# Patient Record
Sex: Male | Born: 1940
Health system: Southern US, Community
[De-identification: ages and names within clinical notes are randomized; demographics above are authoritative.]

## PROBLEM LIST (undated history)

## (undated) DIAGNOSIS — D3A8 Other benign neuroendocrine tumors: Secondary | ICD-10-CM

## (undated) DIAGNOSIS — I10 Essential (primary) hypertension: Secondary | ICD-10-CM

## (undated) DIAGNOSIS — N400 Enlarged prostate without lower urinary tract symptoms: Secondary | ICD-10-CM

## (undated) DIAGNOSIS — K08109 Complete loss of teeth, unspecified cause, unspecified class: Secondary | ICD-10-CM

## (undated) DIAGNOSIS — R7301 Impaired fasting glucose: Secondary | ICD-10-CM

## (undated) DIAGNOSIS — Z973 Presence of spectacles and contact lenses: Secondary | ICD-10-CM

## (undated) DIAGNOSIS — U071 COVID-19: Secondary | ICD-10-CM

## (undated) DIAGNOSIS — R079 Chest pain, unspecified: Secondary | ICD-10-CM

## (undated) DIAGNOSIS — I4892 Unspecified atrial flutter: Secondary | ICD-10-CM

## (undated) DIAGNOSIS — J8410 Pulmonary fibrosis, unspecified: Secondary | ICD-10-CM

## (undated) DIAGNOSIS — I2510 Atherosclerotic heart disease of native coronary artery without angina pectoris: Secondary | ICD-10-CM

## (undated) DIAGNOSIS — Z8679 Personal history of other diseases of the circulatory system: Secondary | ICD-10-CM

## (undated) DIAGNOSIS — R7989 Other specified abnormal findings of blood chemistry: Secondary | ICD-10-CM

## (undated) DIAGNOSIS — Z8639 Personal history of other endocrine, nutritional and metabolic disease: Secondary | ICD-10-CM

## (undated) DIAGNOSIS — I700 Atherosclerosis of aorta: Secondary | ICD-10-CM

## (undated) DIAGNOSIS — E785 Hyperlipidemia, unspecified: Secondary | ICD-10-CM

## (undated) DIAGNOSIS — J189 Pneumonia, unspecified organism: Secondary | ICD-10-CM

## (undated) DIAGNOSIS — E872 Acidosis, unspecified: Secondary | ICD-10-CM

## (undated) DIAGNOSIS — Z972 Presence of dental prosthetic device (complete) (partial): Secondary | ICD-10-CM

## (undated) DIAGNOSIS — J449 Chronic obstructive pulmonary disease, unspecified: Secondary | ICD-10-CM

## (undated) DIAGNOSIS — D72821 Monocytosis (symptomatic): Secondary | ICD-10-CM

## (undated) DIAGNOSIS — R7303 Prediabetes: Secondary | ICD-10-CM

## (undated) DIAGNOSIS — J309 Allergic rhinitis, unspecified: Secondary | ICD-10-CM

## (undated) DIAGNOSIS — Z87891 Personal history of nicotine dependence: Secondary | ICD-10-CM

## (undated) DIAGNOSIS — K5792 Diverticulitis of intestine, part unspecified, without perforation or abscess without bleeding: Secondary | ICD-10-CM

## (undated) DIAGNOSIS — Z85110 Personal history of malignant carcinoid tumor of bronchus and lung: Secondary | ICD-10-CM

## (undated) DIAGNOSIS — E119 Type 2 diabetes mellitus without complications: Secondary | ICD-10-CM

## (undated) DIAGNOSIS — C3490 Malignant neoplasm of unspecified part of unspecified bronchus or lung: Secondary | ICD-10-CM

## (undated) DIAGNOSIS — K625 Hemorrhage of anus and rectum: Secondary | ICD-10-CM

## (undated) DIAGNOSIS — N401 Enlarged prostate with lower urinary tract symptoms: Secondary | ICD-10-CM

## (undated) DIAGNOSIS — I4891 Unspecified atrial fibrillation: Secondary | ICD-10-CM

## (undated) DIAGNOSIS — I499 Cardiac arrhythmia, unspecified: Secondary | ICD-10-CM

## (undated) DIAGNOSIS — M8580 Other specified disorders of bone density and structure, unspecified site: Secondary | ICD-10-CM

## (undated) DIAGNOSIS — R160 Hepatomegaly, not elsewhere classified: Secondary | ICD-10-CM

## (undated) DIAGNOSIS — C229 Malignant neoplasm of liver, not specified as primary or secondary: Secondary | ICD-10-CM

## (undated) DIAGNOSIS — R001 Bradycardia, unspecified: Secondary | ICD-10-CM

## (undated) DIAGNOSIS — J9611 Chronic respiratory failure with hypoxia: Secondary | ICD-10-CM

## (undated) HISTORY — DX: Hypomagnesemia: E83.42

## (undated) HISTORY — DX: Personal history of other endocrine, nutritional and metabolic disease: Z86.39

## (undated) HISTORY — DX: Unspecified atrial flutter: I48.92

## (undated) HISTORY — DX: Essential (primary) hypertension: I10

## (undated) HISTORY — DX: Hepatomegaly, not elsewhere classified: R16.0

## (undated) HISTORY — PX: MULTIPLE TOOTH EXTRACTIONS: SHX2053

## (undated) HISTORY — DX: Impaired fasting glucose: R73.01

## (undated) HISTORY — PX: VASECTOMY: SHX75

## (undated) HISTORY — DX: Benign prostatic hyperplasia without lower urinary tract symptoms: N40.0

## (undated) HISTORY — DX: Chest pain, unspecified: R07.9

## (undated) HISTORY — PX: TONSILLECTOMY: SUR1361

## (undated) HISTORY — DX: Malignant neoplasm of liver, not specified as primary or secondary: C22.9

## (undated) HISTORY — DX: Malignant neoplasm of unspecified part of unspecified bronchus or lung: C34.90

## (undated) HISTORY — PX: BRONCHOSCOPY: SUR163

## (undated) HISTORY — DX: Allergic rhinitis, unspecified: J30.9

## (undated) HISTORY — PX: COLONOSCOPY W/ POLYPECTOMY: SHX1380

## (undated) HISTORY — DX: Benign prostatic hyperplasia with lower urinary tract symptoms: N40.1

## (undated) HISTORY — DX: Monocytosis (symptomatic): D72.821

## (undated) HISTORY — DX: Personal history of malignant carcinoid tumor of bronchus and lung: Z85.110

## (undated) HISTORY — DX: Other specified disorders of bone density and structure, unspecified site: M85.80

---

## 2005-11-02 ENCOUNTER — Ambulatory Visit: Payer: Self-pay | Admitting: General Surgery

## 2011-12-09 ENCOUNTER — Encounter (HOSPITAL_BASED_OUTPATIENT_CLINIC_OR_DEPARTMENT_OTHER): Payer: Self-pay | Admitting: *Deleted

## 2011-12-09 NOTE — Progress Notes (Signed)
Pt has not had much surgery-since he bleed post polypectomy during colonoscopy-asks about bleeding. Did say that was unlikely, but to talk to dr Merlyn Lot. Will need istat and ekg preop-[place]-will call pcp for any records

## 2011-12-10 ENCOUNTER — Other Ambulatory Visit: Payer: Self-pay | Admitting: Orthopedic Surgery

## 2011-12-15 ENCOUNTER — Other Ambulatory Visit: Payer: Self-pay

## 2011-12-15 ENCOUNTER — Ambulatory Visit (HOSPITAL_BASED_OUTPATIENT_CLINIC_OR_DEPARTMENT_OTHER)
Admission: RE | Admit: 2011-12-15 | Discharge: 2011-12-15 | Disposition: A | Discharge status: Home / Self Care | Payer: Medicare Other | Source: Ambulatory Visit | Attending: Orthopedic Surgery | Admitting: Orthopedic Surgery

## 2011-12-15 ENCOUNTER — Encounter (HOSPITAL_BASED_OUTPATIENT_CLINIC_OR_DEPARTMENT_OTHER): Payer: Self-pay | Admitting: Anesthesiology

## 2011-12-15 ENCOUNTER — Ambulatory Visit (HOSPITAL_BASED_OUTPATIENT_CLINIC_OR_DEPARTMENT_OTHER): Payer: Medicare Other | Admitting: Anesthesiology

## 2011-12-15 ENCOUNTER — Encounter (HOSPITAL_BASED_OUTPATIENT_CLINIC_OR_DEPARTMENT_OTHER): Payer: Self-pay | Admitting: *Deleted

## 2011-12-15 ENCOUNTER — Encounter (HOSPITAL_BASED_OUTPATIENT_CLINIC_OR_DEPARTMENT_OTHER)
Admission: RE | Disposition: A | Discharge status: Home / Self Care | Payer: Self-pay | Source: Ambulatory Visit | Attending: Orthopedic Surgery

## 2011-12-15 ENCOUNTER — Encounter (HOSPITAL_BASED_OUTPATIENT_CLINIC_OR_DEPARTMENT_OTHER): Payer: Self-pay | Admitting: Orthopedic Surgery

## 2011-12-15 DIAGNOSIS — M720 Palmar fascial fibromatosis [Dupuytren]: Secondary | ICD-10-CM | POA: Insufficient documentation

## 2011-12-15 DIAGNOSIS — E785 Hyperlipidemia, unspecified: Secondary | ICD-10-CM | POA: Insufficient documentation

## 2011-12-15 DIAGNOSIS — I10 Essential (primary) hypertension: Secondary | ICD-10-CM | POA: Insufficient documentation

## 2011-12-15 HISTORY — DX: Presence of spectacles and contact lenses: Z97.3

## 2011-12-15 HISTORY — DX: Hyperlipidemia, unspecified: E78.5

## 2011-12-15 HISTORY — DX: Complete loss of teeth, unspecified cause, unspecified class: K08.109

## 2011-12-15 HISTORY — DX: Complete loss of teeth, unspecified cause, unspecified class: Z97.2

## 2011-12-15 HISTORY — PX: DUPUYTREN CONTRACTURE RELEASE: SHX1478

## 2011-12-15 LAB — POCT I-STAT, CHEM 8
Hemoglobin: 15.6 g/dL (ref 13.0–17.0)
Potassium: 3.6 mEq/L (ref 3.5–5.1)
Sodium: 141 mEq/L (ref 135–145)
TCO2: 26 mmol/L (ref 0–100)

## 2011-12-15 SURGERY — RELEASE, DUPUYTREN CONTRACTURE
Anesthesia: Monitor Anesthesia Care | Site: Finger | Laterality: Left | Wound class: Clean

## 2011-12-15 MED ORDER — LIDOCAINE HCL (CARDIAC) 20 MG/ML IV SOLN
INTRAVENOUS | Status: DC | PRN
  Administered 2011-12-15: 30 mg via INTRAVENOUS

## 2011-12-15 MED ORDER — 0.9 % SODIUM CHLORIDE (POUR BTL) OPTIME
TOPICAL | Status: DC | PRN
  Administered 2011-12-15: 300 mL

## 2011-12-15 MED ORDER — OXYCODONE-ACETAMINOPHEN 7.5-500 MG PO TABS: 1.0000 | ORAL_TABLET | ORAL | Status: AC | PRN

## 2011-12-15 MED ORDER — PROPOFOL 10 MG/ML IV EMUL
INTRAVENOUS | Status: DC | PRN
  Administered 2011-12-15: 75 ug/kg/min via INTRAVENOUS

## 2011-12-15 MED ORDER — CHLORHEXIDINE GLUCONATE 4 % EX LIQD: 60.0000 mL | Freq: Once | CUTANEOUS | Status: DC

## 2011-12-15 MED ORDER — METOCLOPRAMIDE HCL 5 MG/ML IJ SOLN: 10.0000 mg | Freq: Once | INTRAMUSCULAR | Status: DC | PRN

## 2011-12-15 MED ORDER — CEFAZOLIN SODIUM 1-5 GM-% IV SOLN
INTRAVENOUS | Status: DC | PRN
  Administered 2011-12-15: 2 g via INTRAVENOUS

## 2011-12-15 MED ORDER — HYDROMORPHONE HCL PF 1 MG/ML IJ SOLN: 0.2500 mg | INTRAMUSCULAR | Status: DC | PRN

## 2011-12-15 MED ORDER — OXYCODONE HCL 5 MG/5ML PO SOLN: 5.0000 mg | Freq: Once | ORAL | Status: DC | PRN

## 2011-12-15 MED ORDER — LACTATED RINGERS IV SOLN
INTRAVENOUS | Status: DC
  Administered 2011-12-15: 09:00:00 via INTRAVENOUS

## 2011-12-15 MED ORDER — ROPIVACAINE HCL 5 MG/ML IJ SOLN
INTRAMUSCULAR | Status: DC | PRN
  Administered 2011-12-15: 25 mL

## 2011-12-15 MED ORDER — FENTANYL CITRATE 0.05 MG/ML IJ SOLN
50.0000 ug | INTRAMUSCULAR | Status: DC | PRN
  Administered 2011-12-15 (×2): 50 ug via INTRAVENOUS

## 2011-12-15 MED ORDER — OXYCODONE HCL 5 MG PO TABS: 5.0000 mg | ORAL_TABLET | Freq: Once | ORAL | Status: DC | PRN

## 2011-12-15 MED ORDER — MIDAZOLAM HCL 2 MG/2ML IJ SOLN
1.0000 mg | INTRAMUSCULAR | Status: DC | PRN
  Administered 2011-12-15 (×2): 1 mg via INTRAVENOUS

## 2011-12-15 MED ORDER — LIDOCAINE HCL 1 % IJ SOLN
INTRAMUSCULAR | Status: DC | PRN
  Administered 2011-12-15: 2 mL via INTRADERMAL

## 2011-12-15 SURGICAL SUPPLY — 44 items
BANDAGE GAUZE ELAST BULKY 4 IN (GAUZE/BANDAGES/DRESSINGS) ×2 IMPLANT
BLADE MINI RND TIP GREEN BEAV (BLADE) ×2 IMPLANT
BLADE SURG 15 STRL LF DISP TIS (BLADE) ×1 IMPLANT
BLADE SURG 15 STRL SS (BLADE) ×1
BNDG COHESIVE 3X5 TAN STRL LF (GAUZE/BANDAGES/DRESSINGS) ×2 IMPLANT
BNDG ESMARK 4X9 LF (GAUZE/BANDAGES/DRESSINGS) ×2 IMPLANT
CHLORAPREP W/TINT 26ML (MISCELLANEOUS) ×2 IMPLANT
CLOTH BEACON ORANGE TIMEOUT ST (SAFETY) ×2 IMPLANT
CORDS BIPOLAR (ELECTRODE) ×2 IMPLANT
COVER MAYO STAND STRL (DRAPES) ×2 IMPLANT
COVER TABLE BACK 60X90 (DRAPES) ×2 IMPLANT
CUFF TOURNIQUET SINGLE 18IN (TOURNIQUET CUFF) ×2 IMPLANT
DECANTER SPIKE VIAL GLASS SM (MISCELLANEOUS) IMPLANT
DRAPE EXTREMITY T 121X128X90 (DRAPE) ×2 IMPLANT
DRAPE SURG 17X23 STRL (DRAPES) ×2 IMPLANT
DRSG KUZMA FLUFF (GAUZE/BANDAGES/DRESSINGS) ×2 IMPLANT
GAUZE XEROFORM 1X8 LF (GAUZE/BANDAGES/DRESSINGS) ×2 IMPLANT
GLOVE BIO SURGEON STRL SZ 6.5 (GLOVE) ×2 IMPLANT
GLOVE BIOGEL PI IND STRL 7.0 (GLOVE) ×1 IMPLANT
GLOVE BIOGEL PI INDICATOR 7.0 (GLOVE) ×1
GLOVE SURG ORTHO 8.0 STRL STRW (GLOVE) ×2 IMPLANT
GOWN BRE IMP PREV XXLGXLNG (GOWN DISPOSABLE) ×2 IMPLANT
GOWN PREVENTION PLUS XLARGE (GOWN DISPOSABLE) ×2 IMPLANT
LOOP VESSEL MAXI BLUE (MISCELLANEOUS) ×2 IMPLANT
NS IRRIG 1000ML POUR BTL (IV SOLUTION) ×2 IMPLANT
PACK BASIN DAY SURGERY FS (CUSTOM PROCEDURE TRAY) ×2 IMPLANT
PAD CAST 3X4 CTTN HI CHSV (CAST SUPPLIES) ×1 IMPLANT
PADDING CAST ABS 3INX4YD NS (CAST SUPPLIES)
PADDING CAST ABS 4INX4YD NS (CAST SUPPLIES) ×1
PADDING CAST ABS COTTON 3X4 (CAST SUPPLIES) IMPLANT
PADDING CAST ABS COTTON 4X4 ST (CAST SUPPLIES) ×1 IMPLANT
PADDING CAST COTTON 3X4 STRL (CAST SUPPLIES) ×1
SLEEVE SCD COMPRESS KNEE MED (MISCELLANEOUS) ×2 IMPLANT
SPLINT PLASTER CAST XFAST 3X15 (CAST SUPPLIES) IMPLANT
SPLINT PLASTER XTRA FASTSET 3X (CAST SUPPLIES)
SPONGE GAUZE 4X4 12PLY (GAUZE/BANDAGES/DRESSINGS) ×2 IMPLANT
STOCKINETTE 4X48 STRL (DRAPES) ×2 IMPLANT
SUT SILK 2 0 FS (SUTURE) ×2 IMPLANT
SUT VICRYL RAPIDE 4/0 PS 2 (SUTURE) ×2 IMPLANT
SYR BULB 3OZ (MISCELLANEOUS) ×2 IMPLANT
SYR CONTROL 10ML LL (SYRINGE) ×2 IMPLANT
TOWEL OR 17X24 6PK STRL BLUE (TOWEL DISPOSABLE) ×2 IMPLANT
UNDERPAD 30X30 INCONTINENT (UNDERPADS AND DIAPERS) ×2 IMPLANT
WATER STERILE IRR 1000ML POUR (IV SOLUTION) IMPLANT

## 2011-12-15 NOTE — Progress Notes (Signed)
Assisted Dr. Frederick with left, ultrasound guided, supraclavicular block. Side rails up, monitors on throughout procedure. See vital signs in flow sheet. Tolerated Procedure well. 

## 2011-12-15 NOTE — Transfer of Care (Signed)
Immediate Anesthesia Transfer of Care Note  Patient: Jason Malone  Procedure(s) Performed: Procedure(s) (LRB): DUPUYTREN CONTRACTURE RELEASE (Left)  Patient Location: PACU  Anesthesia Type: MAC combined with regional for post-op pain  Level of Consciousness: awake and alert   Airway & Oxygen Therapy: Patient Spontanous Breathing and Patient connected to face mask oxygen  Post-op Assessment: Report given to PACU RN and Post -op Vital signs reviewed and stable  Post vital signs: Reviewed and stable  Complications: No apparent anesthesia complications

## 2011-12-15 NOTE — Discharge Instructions (Addendum)
[facility] Instructions Hand Surgery  Wound Care: Keep your hand elevated above the level of your heart.  Do not allow it to dangle  by your side.  Keep the dressing dry and do not remove it unless your doctor advises you to do so.  He will usually change it at the time of your post-op visit.  Moving your fingers is advised to stimulate circulation but will depend on the site of your surgery.  If you have a splint applied, your doctor will advise you regarding movement.  Activity: Do not drive or operate machinery today.  Rest today and then you may return to your normal activity and work as indicated by your physician.  Diet:  Drink liquids today or eat a light diet.  You may resume a regular diet tomorrow.    General expectations: Pain for two to three days. Fingers may become slightly swollen.  Call your doctor if any of the following occur: Severe pain not relieved by pain medication. Elevated temperature. Dressing soaked with blood. Inability to move fingers. White or bluish color to fingers.   Post Anesthesia Home Care Instructions  Activity: Get plenty of rest for the remainder of the day. A responsible adult should stay with you for 24 hours following the procedure.  For the next 24 hours, DO NOT: -Drive a car -Advertising copywriter -Drink alcoholic beverages -Take any medication unless instructed by your physician -Make any legal decisions or sign important papers.  Meals: Start with liquid foods such as gelatin or soup. Progress to regular foods as tolerated. Avoid greasy, spicy, heavy foods. If nausea and/or vomiting occur, drink only clear liquids until the nausea and/or vomiting subsides. Call your physician if vomiting continues.  Special Instructions/Symptoms: Your throat may feel dry or sore from the anesthesia or the breathing tube placed in your throat during surgery. If this causes discomfort, gargle with warm salt water. The discomfort should disappear within  24 hours.  Call your surgeon if you experience:   1.  Fever over 101.0. 2.  Inability to urinate. 3.  Nausea and/or vomiting. 4.  Extreme swelling or bruising at the surgical site. 5.  Continued bleeding from the incision. 6.  Increased pain, redness or drainage from the incision. 7.  Problems related to your pain medication     Regional Anesthesia Blocks  1. Numbness or the inability to move the "blocked" extremity may last from 3-48 hours after placement. The length of time depends on the medication injected and your individual response to the medication. If the numbness is not going away after 48 hours, call your surgeon.  2. The extremity that is blocked will need to be protected until the numbness is gone and the  Strength has returned. Because you cannot feel it, you will need to take extra care to avoid injury. Because it may be weak, you may have difficulty moving it or using it. You may not know what position it is in without looking at it while the block is in effect.  3. For blocks in the legs and feet, returning to weight bearing and walking needs to be done carefully. You will need to wait until the numbness is entirely gone and the strength has returned. You should be able to move your leg and foot normally before you try and bear weight or walk. You will need someone to be with you when you first try to ensure you do not fall and possibly risk injury.  4. Bruising and  tenderness at the needle site are common side effects and will resolve in a few days.  5. Persistent numbness or new problems with movement should be communicated to the surgeon or the [facility] [phone] [facility] [phone]).

## 2011-12-15 NOTE — Op Note (Signed)
Dictated number: [id number]

## 2011-12-15 NOTE — Anesthesia Postprocedure Evaluation (Signed)
Anesthesia Post Note  Patient: Jason Malone  Procedure(s) Performed: Procedure(s) (LRB): DUPUYTREN CONTRACTURE RELEASE (Left)  Anesthesia type: MAC  Patient location: PACU  Post pain: Pain level controlled  Post assessment: Patient's Cardiovascular Status Stable  Last Vitals:  Filed Vitals:   12/15/11 1130  BP: 117/76  Pulse: 54  Temp:   Resp: 16    Post vital signs: Reviewed and stable  Level of consciousness: alert  Complications: No apparent anesthesia complications

## 2011-12-15 NOTE — Brief Op Note (Signed)
12/15/2011  11:12 AM  PATIENT:  Jason Malone  71 y.o. male  PRE-OPERATIVE DIAGNOSIS:  Dupuytrens contracture left ring finger  POST-OPERATIVE DIAGNOSIS:  Dupuytrens contracture left ring finger  PROCEDURE:  Procedure(s) (LRB): DUPUYTREN CONTRACTURE RELEASE (Left)  SURGEON:  Surgeon(s) and Role:    * Nicki Reaper, MD - Primary  PHYSICIAN ASSISTANT:   ASSISTANTS: none   ANESTHESIA:   regional and IV sedation  EBL:  Total I/O In: 700 [I.V.:700] Out: -   BLOOD ADMINISTERED:none  DRAINS: none   LOCAL MEDICATIONS USED:  NONE  SPECIMEN:  Excision  DISPOSITION OF SPECIMEN:  PATHOLOGY  COUNTS:  YES  TOURNIQUET:   Total Tourniquet Time Documented: Forearm (Left) - 27 minutes  DICTATION: .Other Dictation: Dictation Number [id number]  PLAN OF CARE: Discharge to home after PACU  PATIENT DISPOSITION:  PACU - hemodynamically stable.   Dela

## 2011-12-15 NOTE — Anesthesia Preprocedure Evaluation (Addendum)
Anesthesia Evaluation  Patient identified by MRN, date of birth, ID band Patient awake    Reviewed: Allergy & Precautions, H&P , NPO status , Patient's Chart, lab work & pertinent test results, reviewed documented beta blocker date and time   Airway Mallampati: II TM Distance: >3 FB Neck ROM: full    Dental   Pulmonary neg pulmonary ROS,  breath sounds clear to auscultation        Cardiovascular hypertension, Pt. on medications Rhythm:regular     Neuro/Psych negative neurological ROS  negative psych ROS   GI/Hepatic negative GI ROS, Neg liver ROS,   Endo/Other  negative endocrine ROS  Renal/GU negative Renal ROS  negative genitourinary   Musculoskeletal   Abdominal   Peds  Hematology negative hematology ROS (+)   Anesthesia Other Findings See surgeon's H&P   Reproductive/Obstetrics negative OB ROS                           Anesthesia Physical Anesthesia Plan  ASA: II  Anesthesia Plan: MAC and Regional   Post-op Pain Management:    Induction: Intravenous  Airway Management Planned: Simple Face Mask  Additional Equipment:   Intra-op Plan:   Post-operative Plan: Extubation in OR  Informed Consent: I have reviewed the patients History and Physical, chart, labs and discussed the procedure including the risks, benefits and alternatives for the proposed anesthesia with the patient or authorized representative who has indicated his/her understanding and acceptance.   Dental Advisory Given  Plan Discussed with: CRNA and Surgeon  Anesthesia Plan Comments:       Anesthesia Quick Evaluation

## 2011-12-15 NOTE — H&P (Signed)
Jason Malone is a 71 year old right hand dominant male who comes in complaining of bilateral finger problems, ring finger left greater than right. He states this has been going on for approximately 30 years. It has recently increased on his left hand. He shows no history of injury. He is of [place] descent. He does not have these on his feet or penis. No history of diabetes, thyroid problems, arthritis or gout. He is not complaining of any pain or discomfort. He states it is gradually getting worse.   Past Medical History: He has no allergies. He is on Simvastatin, Lisinopril, vitamins and fiber. He has had a tonsillectomy.  Family Medical History: Negative.  Social History: He does not smoke or drink. He is married.  Review of Systems: Positive for glasses, high BP, otherwise negative for 14 points. Jason Malone is an 71 y.o. male.   Chief Complaint: dupuytrens lrf  HPI: see above  Past Medical History  Diagnosis Date  . Hypertension   . Hyperlipemia   . Full dentures   . Wears glasses     Past Surgical History  Procedure Date  . Multiple tooth extractions   . Tonsillectomy   . Colonoscopy w/ polypectomy     bleed after-had to go to surgery to stop bleeding via colonoscopy    History reviewed. No pertinent family history. Social History:  reports that he quit smoking about 13 years ago. He does not have any smokeless tobacco history on file. He reports that he does not drink alcohol or use illicit drugs.  Allergies: No Known Allergies  Medications Prior to Admission  Medication Sig Dispense Refill  . b complex vitamins capsule Take 1 capsule by mouth daily.      [name] lisinopril (PRINIVIL,ZESTRIL) 20 MG tablet Take 20 mg by mouth daily.      . Multiple Vitamins-Minerals (MULTIVITAMIN WITH MINERALS) tablet Take 1 tablet by mouth daily.      . polycarbophil (FIBERCON) 625 MG tablet Take 625 mg by mouth daily.      . simvastatin (ZOCOR) 40 MG tablet Take 40 mg by mouth  every morning.      . vitamin C (ASCORBIC ACID) 500 MG tablet Take 500 mg by mouth daily.        Results for orders placed during the hospital encounter of 12/15/11 (from the past 48 hour(s))  POCT I-STAT, CHEM 8     Status: Abnormal   Collection Time   12/15/11  8:53 AM      Component Value Range Comment   Sodium 141  135 - 145 mEq/L    Potassium 3.6  3.5 - 5.1 mEq/L    Chloride 104  96 - 112 mEq/L    BUN 18  6 - 23 mg/dL    Creatinine, Ser 1.61  0.50 - 1.35 mg/dL    Glucose, Bld 096 (*) 70 - 99 mg/dL    Calcium, Ion 0.45  4.09 - 1.30 mmol/L    TCO2 26  0 - 100 mmol/L    Hemoglobin 15.6  13.0 - 17.0 g/dL    HCT 81.1  91.4 - 78.2 %     No results found.   Pertinent items are noted in HPI.  Blood pressure 129/71, pulse 57, temperature 97.8 F (36.6 C), temperature source Oral, resp. rate 13, height 5\' 5"  (1.651 m), weight 165 lb 8 oz (75.07 kg), SpO2 100.00%.  General appearance: alert, cooperative and appears stated age Head: Normocephalic, without obvious abnormality Neck:  no adenopathy Resp: clear to auscultation bilaterally Cardio: regular rate and rhythm, S1, S2 normal, no murmur, click, rub or gallop GI: soft, non-tender; bowel sounds normal; no masses,  no organomegaly Extremities: extremities normal, atraumatic, no cyanosis or edema Pulses: 2+ and symmetric Skin: Skin color, texture, turgor normal. No rashes or lesions Neurologic: Grossly normal Incision/Wound: na  Assessment/Plan Diagnosis: (1) Dupuytren's cords ring fingers bilaterally. (2) Contracture of the left.  We have had a very long thorough discussion with respect to the etiology of the problem with him and his daughter and the various treatment alternatives including use of Collagenaise, use of needle aponeurotomy, the possibility of fasciotomy, fasciectomy, risks and complications of each. He is aware there is no guarantee with surgery, possibility of infection, recurrence, injury to arteries, nerves and  tendons, incomplete relief of symptoms and dystrophy.  He is advised of possibility of stiffness to his fingers and the necessity of splinting afterwards, possibility of skin tears, allergic response or reaction to the Xiaflex and etiology of the Xiaflex, etiology and treatment alternatives of aponeurotomy given the possible course of the radiation treatment for this. They have questions which are answered to their satisfaction. Between the various treatment alternatives he would like to have a fasciotomy done. He is aware of risks and complications left ring finger, healing time, open areas, and splinting at night for 3-6 months. He is scheduled for fasciotomy of Dupuytren's left ring finger as an outpatient.  Jason Malone R 12/15/2011, 9:30 AM

## 2011-12-15 NOTE — Anesthesia Procedure Notes (Signed)
Anesthesia Regional Block:  Supraclavicular block  Pre-Anesthetic Checklist: ,, timeout performed, Correct Patient, Correct Site, Correct Laterality, Correct Procedure, Correct Position, site marked, Risks and benefits discussed,  Surgical consent,  Pre-op evaluation,  At surgeon's request and post-op pain management  Laterality: Left  Prep: chloraprep       Needles:   Needle Type: Other   (Arrow Echogenic)   Needle Length: 9cm  Needle Gauge: 21    Additional Needles:  Procedures: ultrasound guided Supraclavicular block Narrative:  Start time: 12/15/2011 9:19 AM End time: 12/15/2011 9:26 AM Injection made incrementally with aspirations every 5 mL.  Performed by: Personally  Anesthesiologist: C Catlin Aycock  Additional Notes: Ultrasound guidance used to: id relevant anatomy, confirm needle position, local anesthetic spread, avoidance of vascular puncture. Picture saved. No complications. Block performed personally by Janetta Hora. Gelene Mink, MD    Supraclavicular block

## 2011-12-16 ENCOUNTER — Encounter (HOSPITAL_BASED_OUTPATIENT_CLINIC_OR_DEPARTMENT_OTHER): Payer: Self-pay | Admitting: Orthopedic Surgery

## 2011-12-16 NOTE — Op Note (Signed)
[facility], BANAS                ACCOUNT NO.:  [id number]  MEDICAL RECORD NO.:  [id number]  LOCATION:                                 FACILITY:  PHYSICIAN:  Cindee Salt, M.D.            DATE OF BIRTH:  DATE OF PROCEDURE:  12/15/2011 DATE OF DISCHARGE:                              OPERATIVE REPORT   PREOPERATIVE DIAGNOSIS:  Dupuytren contracture, left ring finger.  POSTOPERATIVE DIAGNOSIS:  Dupuytren contracture, left ring finger.  OPERATION:  Fasciotomy of left ring finger, palm and finger.  SURGEON:  Cindee Salt, M.D.  ASSISTANT:  None.  ANESTHESIA:  Supraclavicular block sedation.  ANESTHESIOLOGIST:  Janetta Hora. Gelene Mink, M.D.  HISTORY:  The patient is a 71 year old male with a history of Dupuytren contracture of his left ring finger.  He has elected to undergo a fasciotomy being well aware of risks and complications, alternative treatments including epineurotomy, Xiaflex injection, fasciectomy.  Pre, peri, and postoperative course have been discussed along with risks and complications.  He is aware that there is no guarantee with the surgery; possibility of infection; recurrence of injury to arteries, nerves, tendons; incomplete relief of symptoms; and dystrophy.  In the preoperative area, the patient is seen, the extremity marked by both the patient and surgeon.  Antibiotic given.  PROCEDURE:  The patient was brought to the operating room where a supraclavicular block was carried out without difficulty.  He was prepped using ChloraPrep, supine position with the left arm free.  A 3- minute dry time was allowed.  Time-out taken, confirming the patient and procedure.  The limb was exsanguinated with an Esmarch bandage. Tourniquet placed on the forearm was inflated to 250 mmHg.  A transverse incision was made, multiple levels from the mid-proximal palm to the PIP joint of the right left ring finger.  These were carried down through subcutaneous tissue.  The cord was  identified proximally, care taken to protect neurovascular structures which were identified, and the cord was incised and a section removed.  This was sequentially done on moving out the finger to the level of the proximal phalanx where a large lateral cord was noted to insert out to the level of the PIP joint. Neurovascular bundle was protected and the cord and large nodule was excised and sent to Pathology.  Each one of the 4 wounds were then irrigated.  Neurovascular structures identified within them found to be intact.  The wounds were then closed with interrupted 4-0 Vicryl Rapide sutures.  The finger was able to be fully straightened at all joints.  A sterile compressive dressing was applied.  On deflation of the tourniquet, all fingers immediately pinked.  He was taken to the recovery room for observation.          ______________________________ Cindee Salt, M.D.     GK/MEDQ  D:  12/15/2011  T:  12/16/2011  Job:  [id number]

## 2012-11-20 ENCOUNTER — Encounter: Payer: Self-pay | Admitting: *Deleted

## 2012-12-13 ENCOUNTER — Ambulatory Visit (INDEPENDENT_AMBULATORY_CARE_PROVIDER_SITE_OTHER): Payer: Medicare Other | Admitting: General Surgery

## 2012-12-13 ENCOUNTER — Encounter: Payer: Self-pay | Admitting: General Surgery

## 2012-12-13 VITALS — BP 124/72 | HR 70 | Resp 14 | Ht 65.0 in | Wt 171.0 lb

## 2012-12-13 DIAGNOSIS — K625 Hemorrhage of anus and rectum: Secondary | ICD-10-CM

## 2012-12-13 LAB — POC HEMOCCULT BLD/STL (OFFICE/1-CARD/DIAGNOSTIC): Fecal Occult Blood, POC: NEGATIVE

## 2012-12-13 NOTE — Progress Notes (Signed)
Patient ID: Jason Malone, male   DOB: 1940/08/03, 72 y.o.   MRN: [id number]  Chief Complaint  Patient presents with  . Rectal Bleeding    HPI Jason Malone is a 72 y.o. male who presents for an evaluation of rectal bleeding.Patient states he has been bleeding off and on for seven years. Patient last colonoscopy was in 2007 .   HPI  Past Medical History  Diagnosis Date  . Hypertension   . Hyperlipemia   . Full dentures   . Wears glasses     Past Surgical History  Procedure Laterality Date  . Multiple tooth extractions    . Tonsillectomy    . Colonoscopy w/ polypectomy      bleed after-had to go to surgery to stop bleeding via colonoscopy  . Dupuytren contracture release  12/15/2011    Procedure: DUPUYTREN CONTRACTURE RELEASE;  Surgeon: Nicki Reaper, MD;  [facility];  Service: Orthopedics;  Laterality: Left;  Fasciotomy left ring finger dupuytrens    Family History  Problem Relation Age of Onset  . Stroke Mother     Social History History  Substance Use Topics  . Smoking status: Former Smoker    Quit date: 12/09/1999  . Smokeless tobacco: Never Used  . Alcohol Use: No    No Known Allergies  Current Outpatient Prescriptions  Medication Sig Dispense Refill  . b complex vitamins capsule Take 1 capsule by mouth daily.      [name] lisinopril (PRINIVIL,ZESTRIL) 20 MG tablet Take 20 mg by mouth daily.      [name] lisinopril-hydrochlorothiazide (PRINZIDE,ZESTORETIC) 20-25 MG per tablet Take 1 tablet by mouth daily.      . Multiple Vitamins-Minerals (MULTIVITAMIN WITH MINERALS) tablet Take 1 tablet by mouth daily.      . polycarbophil (FIBERCON) 625 MG tablet Take 625 mg by mouth daily.      . simvastatin (ZOCOR) 40 MG tablet Take 40 mg by mouth every morning.      . vitamin C (ASCORBIC ACID) 500 MG tablet Take 500 mg by mouth daily.       No current facility-administered medications for this visit.    Review of Systems Review of Systems  Constitutional:  Negative.   Respiratory: Negative.   Cardiovascular: Negative.   Gastrointestinal: Positive for blood in stool and anal bleeding. Negative for nausea, vomiting, abdominal pain, diarrhea, constipation, abdominal distention and rectal pain.    Blood pressure 124/72, pulse 70, resp. rate 14, height 5\' 5"  (1.651 m), weight 171 lb (77.565 kg).  Physical Exam Physical Exam  Constitutional: He is oriented to person, place, and time. He appears well-developed and well-nourished.  Cardiovascular: Normal rate, regular rhythm, normal heart sounds, intact distal pulses and normal pulses.   No edema  Pulmonary/Chest: Breath sounds normal.  Abdominal: Soft. Bowel sounds are normal.  Genitourinary: Rectal exam shows internal hemorrhoid.  Left anterior on the rectal wall small interior hemorrhoids.  Lymphadenopathy:    He has no cervical adenopathy.  Neurological: He is alert and oriented to person, place, and time.  Skin: Skin is warm and dry.    Data Reviewed Colonoscopy completed in August 2003 showed a tubulovillous adenoma at 50 cm. Colonoscopy completed in June 2007 showed a hyperplastic polyp in the sigmoid colon.  Upper endoscopy completed 11/02/2005 was normal.  Assessment    Rectal bleeding from an minimally inflamed internal hemorrhoids.    Plan    Considering the rare up so the bleeding, observation alone is warranted.  The patient is a candidate for repeat colonoscopy in 2015 based on his previous findings in 2003.       Jason Malone 12/15/2012, 6:08 PM

## 2012-12-13 NOTE — Patient Instructions (Signed)
Patient to return for an colonoscopy

## 2012-12-15 ENCOUNTER — Encounter: Payer: Self-pay | Admitting: General Surgery

## 2012-12-15 DIAGNOSIS — K625 Hemorrhage of anus and rectum: Secondary | ICD-10-CM | POA: Insufficient documentation

## 2013-03-28 ENCOUNTER — Emergency Department: Payer: Self-pay | Admitting: Emergency Medicine

## 2013-03-28 LAB — COMPREHENSIVE METABOLIC PANEL
Albumin: 3.2 g/dL — ABNORMAL LOW (ref 3.4–5.0)
Alkaline Phosphatase: 101 U/L (ref 50–136)
Bilirubin,Total: 0.4 mg/dL (ref 0.2–1.0)
Calcium, Total: 9.1 mg/dL (ref 8.5–10.1)
Co2: 28 mmol/L (ref 21–32)
Creatinine: 1.14 mg/dL (ref 0.60–1.30)
EGFR (African American): >60
EGFR (Non-African Amer.): >60
Potassium: 3.1 mmol/L — ABNORMAL LOW (ref 3.5–5.1)
SGPT (ALT): 26 U/L (ref 12–78)
Sodium: 138 mmol/L (ref 136–145)

## 2013-03-28 LAB — CBC WITH DIFFERENTIAL/PLATELET
Basophil #: 0.1 10*3/uL (ref 0.0–0.1)
Basophil %: 0.7 %
Eosinophil #: 0.2 10*3/uL (ref 0.0–0.7)
Lymphocyte #: 1.2 10*3/uL (ref 1.0–3.6)
Lymphocyte %: 9.5 %
MCH: 30.3 pg (ref 26.0–34.0)
MCV: 89 fL (ref 80–100)
Monocyte #: 2.1 x10 3/mm — ABNORMAL HIGH (ref 0.2–1.0)
Neutrophil #: 9 10*3/uL — ABNORMAL HIGH (ref 1.4–6.5)
RBC: 4.56 10*6/uL (ref 4.40–5.90)
WBC: 12.6 10*3/uL — ABNORMAL HIGH (ref 3.8–10.6)

## 2013-03-28 LAB — PROTIME-INR: Prothrombin Time: 13.5 secs (ref 11.5–14.7)

## 2013-03-28 LAB — TROPONIN I: Troponin-I: <0.02 ng/mL

## 2013-03-29 ENCOUNTER — Inpatient Hospital Stay: Payer: Self-pay | Admitting: Internal Medicine

## 2013-03-29 DIAGNOSIS — I4892 Unspecified atrial flutter: Secondary | ICD-10-CM

## 2013-03-29 DIAGNOSIS — E785 Hyperlipidemia, unspecified: Secondary | ICD-10-CM

## 2013-03-29 LAB — CBC
HCT: 44.4 % (ref 40.0–52.0)
HGB: 14.9 g/dL (ref 13.0–18.0)
Platelet: 190 10*3/uL (ref 150–440)
RDW: 14 % (ref 11.5–14.5)

## 2013-03-29 LAB — MAGNESIUM: Magnesium: 1.6 mg/dL — ABNORMAL LOW

## 2013-03-29 LAB — BASIC METABOLIC PANEL
BUN: 15 mg/dL (ref 7–18)
Calcium, Total: 9.2 mg/dL (ref 8.5–10.1)
Co2: 26 mmol/L (ref 21–32)
Creatinine: 1.11 mg/dL (ref 0.60–1.30)
EGFR (Non-African Amer.): >60
Osmolality: 275 (ref 275–301)

## 2013-03-29 LAB — TROPONIN I
Troponin-I: <0.02 ng/mL
Troponin-I: <0.02 ng/mL

## 2013-03-29 LAB — TSH: Thyroid Stimulating Horm: 3.54 u[IU]/mL

## 2013-03-30 ENCOUNTER — Ambulatory Visit: Payer: Self-pay | Admitting: Oncology

## 2013-03-30 DIAGNOSIS — I10 Essential (primary) hypertension: Secondary | ICD-10-CM

## 2013-03-30 LAB — CBC WITH DIFFERENTIAL/PLATELET
Basophil #: 0.1 10*3/uL (ref 0.0–0.1)
Basophil %: 0.5 %
Eosinophil #: 0.2 10*3/uL (ref 0.0–0.7)
HCT: 38 % — ABNORMAL LOW (ref 40.0–52.0)
HGB: 13.1 g/dL (ref 13.0–18.0)
Lymphocyte #: 1.2 10*3/uL (ref 1.0–3.6)
MCHC: 34.4 g/dL (ref 32.0–36.0)
MCV: 89 fL (ref 80–100)
Monocyte #: 1.7 x10 3/mm — ABNORMAL HIGH (ref 0.2–1.0)
Monocyte %: 15.9 %
Neutrophil #: 7.8 10*3/uL — ABNORMAL HIGH (ref 1.4–6.5)
Neutrophil %: 71.2 %
Platelet: 180 10*3/uL (ref 150–440)

## 2013-03-30 LAB — BASIC METABOLIC PANEL
BUN: 13 mg/dL (ref 7–18)
Calcium, Total: 8.8 mg/dL (ref 8.5–10.1)
Chloride: 108 mmol/L — ABNORMAL HIGH (ref 98–107)
Creatinine: 1.12 mg/dL (ref 0.60–1.30)
EGFR (African American): >60
EGFR (Non-African Amer.): >60
Glucose: 119 mg/dL — ABNORMAL HIGH (ref 65–99)
Osmolality: 279 (ref 275–301)
Potassium: 3.4 mmol/L — ABNORMAL LOW (ref 3.5–5.1)
Sodium: 139 mmol/L (ref 136–145)

## 2013-04-02 ENCOUNTER — Telehealth: Payer: Self-pay

## 2013-04-02 NOTE — Telephone Encounter (Signed)
Patient contacted regarding discharge from [facility] on 03/30/13.  Patient understands to follow up with provider Dr. Mariah Milling on 04/09/13 at 11:15 at [facility]. Patient understands discharge instructions? yes Patient understands medications and regiment? yes Patient understands to bring all medications to this visit? yes

## 2013-04-06 ENCOUNTER — Ambulatory Visit: Payer: Self-pay | Admitting: Internal Medicine

## 2013-04-09 ENCOUNTER — Encounter: Payer: Self-pay | Admitting: Cardiovascular Disease

## 2013-04-09 ENCOUNTER — Ambulatory Visit (INDEPENDENT_AMBULATORY_CARE_PROVIDER_SITE_OTHER): Payer: Medicare Other | Admitting: Cardiovascular Disease

## 2013-04-09 VITALS — BP 130/72 | HR 64 | Ht 65.0 in | Wt 169.8 lb

## 2013-04-09 DIAGNOSIS — Z87891 Personal history of nicotine dependence: Secondary | ICD-10-CM

## 2013-04-09 DIAGNOSIS — I10 Essential (primary) hypertension: Secondary | ICD-10-CM

## 2013-04-09 DIAGNOSIS — C3490 Malignant neoplasm of unspecified part of unspecified bronchus or lung: Secondary | ICD-10-CM | POA: Insufficient documentation

## 2013-04-09 DIAGNOSIS — I4892 Unspecified atrial flutter: Secondary | ICD-10-CM | POA: Insufficient documentation

## 2013-04-09 DIAGNOSIS — E876 Hypokalemia: Secondary | ICD-10-CM | POA: Insufficient documentation

## 2013-04-09 DIAGNOSIS — I4891 Unspecified atrial fibrillation: Secondary | ICD-10-CM

## 2013-04-09 MED ORDER — DILTIAZEM HCL ER COATED BEADS 120 MG PO CP24: 120.0000 mg | ORAL_CAPSULE | Freq: Every day | ORAL | Status: DC

## 2013-04-09 NOTE — Assessment & Plan Note (Signed)
Recent diagnosis of cancer on the left. Sees Dr. Orlie Dakin this week

## 2013-04-09 NOTE — Assessment & Plan Note (Signed)
A potassium likely contributing to his arrhythmia. HCTZ was held for potassium 3.1 the hospital.

## 2013-04-09 NOTE — Patient Instructions (Signed)
Hold the diltiazem 30 mg pills Change to the diltiazem 120 mg pill one a day  Take a diltiazem 30 mg pill as needed for fast heart rate Call the office  Please call us if you have new issues that need to be addressed before your next appt.  Your physician wants you to follow-up in: 6 months.  You will receive a reminder letter in the mail two months in advance. If you don't receive a letter, please call our office to schedule the follow-up appointment.

## 2013-04-09 NOTE — Progress Notes (Signed)
Patient ID: Jason Malone, male    DOB: [birth year], 72 y.o.   MRN: [id number]  HPI Comments: Jason Malone is a 72 year old gentleman with long history of smoking, recently diagnosed lung cancer on the left, hypertension, hyperlipidemia who presented to the hospital 03/29/2013 with tachycardia, shortness of breath, atrial flutter. He was treated with rate controlling medications, rate did improve and converted to normal sinus rhythm that night. He was discharged the next day with followup today. He did have significant electrolyte abnormality on arrival to the hospital with potassium 3.1, low magnesium felt secondary to his HCTZ. This was stopped in the hospital.   In followup, he reports that he  feels well. Denies any additional episodes of tachycardia or shortness of breath. Tolerating diltiazem 30 mg 3 times a day relatively well. Denies any lightheadedness or dizziness, no lower extremity edema. Biopsy done late last week, Dr.  Belia Heman did a bronchoscopy . He has followup with oncology this week. Reports that his blood pressure has been well-controlled.  Echocardiogram 03/29/2013 shows atrial flutter, ejection fraction 55-60%, otherwise normal study As mentioned, potassium on 03/29/2013 was 3.1 magnesium 1.6, cardiac enzymes negative, TSH 3.5  CT scan of the chest shows 4.6 x 4.1 x 4.2 cm left hilar mass encasing the left pulmonary artery branches  EKG today shows normal sinus rhythm with rate 64 beats per minute, no significant ST or T wave changes   Outpatient Encounter Prescriptions as of 04/09/2013  Medication Sig  . b complex vitamins capsule Take 1 capsule by mouth daily.  [name] diltiazem (CARDIZEM) 30 MG tablet Take 30 mg by mouth every 8 (eight) hours.   . diphenhydrAMINE-zinc acetate (BENADRYL) cream Apply topically 3 (three) times daily as needed for itching. itching  . Multiple Vitamins-Minerals (MULTIVITAMIN WITH MINERALS) tablet Take 1 tablet by mouth daily.  . polycarbophil (FIBERCON) 625 MG  tablet Take 625 mg by mouth daily.  . simvastatin (ZOCOR) 40 MG tablet Take 40 mg by mouth every morning.  . vitamin C (ASCORBIC ACID) 500 MG tablet Take 500 mg by mouth daily.  [name] diltiazem (CARDIZEM CD) 120 MG 24 hr capsule Take 1 capsule (120 mg total) by mouth daily.  . [DISCONTINUED] lisinopril (PRINIVIL,ZESTRIL) 20 MG tablet Take 20 mg by mouth daily.  . [DISCONTINUED] lisinopril-hydrochlorothiazide (PRINZIDE,ZESTORETIC) 20-25 MG per tablet Take 1 tablet by mouth daily.     Review of Systems  Constitutional: Negative.   HENT: Negative.   Eyes: Negative.   Respiratory: Negative.   Cardiovascular: Negative.   Gastrointestinal: Negative.   Endocrine: Negative.   Musculoskeletal: Negative.   Skin: Negative.   Allergic/Immunologic: Negative.   Neurological: Negative.   Hematological: Negative.   Psychiatric/Behavioral: Negative.   All other systems reviewed and are negative.    BP 130/72  Pulse 64  Ht 5\' 5"  (1.651 m)  Wt 169 lb 12 oz (76.998 kg)  BMI 28.25 kg/m2  Physical Exam  Nursing note and vitals reviewed. Constitutional: He is oriented to person, place, and time. He appears well-developed and well-nourished.  HENT:  Head: Normocephalic.  Nose: Nose normal.  Mouth/Throat: Oropharynx is clear and moist.  Eyes: Conjunctivae are normal. Pupils are equal, round, and reactive to light.  Neck: Normal range of motion. Neck supple. No JVD present.  Cardiovascular: Normal rate, regular rhythm, S1 normal, S2 normal, normal heart sounds and intact distal pulses.  Exam reveals no gallop and no friction rub.   No murmur heard. Pulmonary/Chest: Effort normal. No respiratory distress. He has wheezes.  He has no rales. He exhibits no tenderness.  Abdominal: Soft. Bowel sounds are normal. He exhibits no distension. There is no tenderness.  Musculoskeletal: Normal range of motion. He exhibits no edema and no tenderness.  Lymphadenopathy:    He has no cervical adenopathy.   Neurological: He is alert and oriented to person, place, and time. Coordination normal.  Skin: Skin is warm and dry. No rash noted. No erythema.  Psychiatric: He has a normal mood and affect. His behavior is normal. Judgment and thought content normal.      Assessment and Plan

## 2013-04-09 NOTE — Assessment & Plan Note (Signed)
Maintaining normal sinus rhythm. We will change him from short acting diltiazem to diltiazem 120 mg daily dosing. We have suggested if he has additional episodes of tachycardia that he take diltiazem 30 mg and call our office.

## 2013-04-09 NOTE — Assessment & Plan Note (Signed)
He reports blood pressure borderline elevated at home. We'll change short acting diltiazem to long-acting 120 mg diltiazem. Suggested he monitor blood pressure at home

## 2013-04-09 NOTE — Assessment & Plan Note (Signed)
He stopped smoking in the past. Lung cancer likely from history of smoking.

## 2013-04-10 ENCOUNTER — Ambulatory Visit: Payer: Self-pay | Admitting: Oncology

## 2013-04-12 LAB — PULMONARY FUNCTION TEST

## 2013-04-17 ENCOUNTER — Ambulatory Visit: Payer: Self-pay | Admitting: Cardiothoracic Surgery

## 2013-04-19 ENCOUNTER — Other Ambulatory Visit (HOSPITAL_COMMUNITY): Payer: Self-pay | Admitting: Cardiothoracic Surgery

## 2013-04-19 DIAGNOSIS — R918 Other nonspecific abnormal finding of lung field: Secondary | ICD-10-CM

## 2013-04-20 ENCOUNTER — Encounter (HOSPITAL_COMMUNITY): Payer: Self-pay | Admitting: Pharmacy Technician

## 2013-04-20 ENCOUNTER — Other Ambulatory Visit (HOSPITAL_COMMUNITY): Payer: Self-pay | Admitting: Radiology

## 2013-04-25 ENCOUNTER — Ambulatory Visit (HOSPITAL_COMMUNITY)
Admission: RE | Admit: 2013-04-25 | Discharge: 2013-04-25 | Disposition: A | Discharge status: Home / Self Care | Payer: Medicare Other | Source: Ambulatory Visit | Attending: Cardiothoracic Surgery | Admitting: Cardiothoracic Surgery

## 2013-04-25 ENCOUNTER — Encounter (HOSPITAL_COMMUNITY): Payer: Self-pay

## 2013-04-25 DIAGNOSIS — R918 Other nonspecific abnormal finding of lung field: Secondary | ICD-10-CM

## 2013-04-25 DIAGNOSIS — Y838 Other surgical procedures as the cause of abnormal reaction of the patient, or of later complication, without mention of misadventure at the time of the procedure: Secondary | ICD-10-CM | POA: Insufficient documentation

## 2013-04-25 DIAGNOSIS — N400 Enlarged prostate without lower urinary tract symptoms: Secondary | ICD-10-CM | POA: Insufficient documentation

## 2013-04-25 DIAGNOSIS — IMO0002 Reserved for concepts with insufficient information to code with codable children: Secondary | ICD-10-CM | POA: Insufficient documentation

## 2013-04-25 DIAGNOSIS — D3A098 Benign carcinoid tumors of other sites: Secondary | ICD-10-CM | POA: Insufficient documentation

## 2013-04-25 DIAGNOSIS — Z87891 Personal history of nicotine dependence: Secondary | ICD-10-CM | POA: Insufficient documentation

## 2013-04-25 DIAGNOSIS — E785 Hyperlipidemia, unspecified: Secondary | ICD-10-CM | POA: Insufficient documentation

## 2013-04-25 DIAGNOSIS — Z01812 Encounter for preprocedural laboratory examination: Secondary | ICD-10-CM | POA: Insufficient documentation

## 2013-04-25 DIAGNOSIS — I10 Essential (primary) hypertension: Secondary | ICD-10-CM | POA: Insufficient documentation

## 2013-04-25 DIAGNOSIS — I4891 Unspecified atrial fibrillation: Secondary | ICD-10-CM | POA: Insufficient documentation

## 2013-04-25 LAB — CBC
MCH: 31.5 pg (ref 26.0–34.0)
Platelets: 204 10*3/uL (ref 150–400)
RBC: 5.36 MIL/uL (ref 4.22–5.81)

## 2013-04-25 LAB — APTT: aPTT: 30 seconds (ref 24–37)

## 2013-04-25 LAB — PROTIME-INR
INR: 0.92 (ref 0.00–1.49)
Prothrombin Time: 12.2 seconds (ref 11.6–15.2)

## 2013-04-25 MED ORDER — LIDOCAINE HCL 1 % IJ SOLN
INTRAMUSCULAR | Status: AC
  Filled 2013-04-25: qty 10

## 2013-04-25 MED ORDER — FENTANYL CITRATE 0.05 MG/ML IJ SOLN
INTRAMUSCULAR | Status: AC | PRN
  Administered 2013-04-25: 50 ug via INTRAVENOUS
  Administered 2013-04-25 (×2): 25 ug via INTRAVENOUS

## 2013-04-25 MED ORDER — HYDROCODONE-ACETAMINOPHEN 5-325 MG PO TABS: 1.0000 | ORAL_TABLET | ORAL | Status: DC | PRN

## 2013-04-25 MED ORDER — MIDAZOLAM HCL 2 MG/2ML IJ SOLN
INTRAMUSCULAR | Status: AC | PRN
  Administered 2013-04-25 (×2): 0.5 mg via INTRAVENOUS
  Administered 2013-04-25: 1 mg via INTRAVENOUS

## 2013-04-25 MED ORDER — SODIUM CHLORIDE 0.9 % IV SOLN
Freq: Once | INTRAVENOUS | Status: AC
  Administered 2013-04-25: 11:00:00 via INTRAVENOUS

## 2013-04-25 NOTE — H&P (Signed)
Agree.  Patient seen.  CT reviewed and case discussed with Dr. Thelma Barge.  Nondiagnostic bronchoscopy.  Will proceed with biopsy of central left upper lobe lung mass today.

## 2013-04-25 NOTE — Procedures (Signed)
Procedure:  CT guided core biopsy of left lung mass Findings:  Central LUL mass localized and sampled via 17 G needle with 18 G core bx x 3.  No PTX or significant hemorrhage. Plan:  3 hr bedrest

## 2013-04-25 NOTE — H&P (Signed)
Jason Malone is an 72 y.o. male.   Chief Complaint: pt was admitted to [facility] end of Nov 2014 For "racing heart".  Dx with Atrial fibrillation Work up included Chest CT which revealed Left lung mass and hilar mass. +PET bronchoscopy of hilar mass was inconclusive. Pt now scheduled for Left lung / hilar mass biopsy  HPI: HTN; quit smoking 2000; HLD; BPH; a fib  Past Medical History  Diagnosis Date  . Hypertension   . Hyperlipemia   . Full dentures   . Wears glasses   . Cancer   . Lung cancer   . Enlarged prostate   . A-fib   . Hypomagnesemia   . H/O hypokalemia     Past Surgical History  Procedure Laterality Date  . Multiple tooth extractions    . Tonsillectomy    . Colonoscopy w/ polypectomy      bleed after-had to go to surgery to stop bleeding via colonoscopy  . Dupuytren contracture release  12/15/2011    Procedure: DUPUYTREN CONTRACTURE RELEASE;  Surgeon: Nicki Reaper, MD;  Location: [facility];  Service: Orthopedics;  Laterality: Left;  Fasciotomy left ring finger dupuytrens    Family History  Problem Relation Age of Onset  . Stroke Mother   . Hypertension Sister   . Hyperlipidemia Sister   . Hypertension Brother   . Hyperlipidemia Brother    Social History:  reports that he quit smoking about 13 years ago. He has never used smokeless tobacco. He reports that he does not drink alcohol or use illicit drugs.  Allergies: No Known Allergies   (Not in a hospital admission)  Results for orders placed during the hospital encounter of 04/25/13 (from the past 48 hour(s))  APTT     Status: None   Collection Time    04/25/13 11:20 AM      Result Value Range   aPTT 30  24 - 37 seconds  CBC     Status: None   Collection Time    04/25/13 11:20 AM      Result Value Range   WBC 7.2  4.0 - 10.5 K/uL   RBC 5.36  4.22 - 5.81 MIL/uL   Hemoglobin 16.9  13.0 - 17.0 g/dL   HCT 16.1  09.6 - 04.5 %   MCV 91.8  78.0 - 100.0 fL   MCH 31.5   26.0 - 34.0 pg   MCHC 34.3  30.0 - 36.0 g/dL   RDW 40.9  81.1 - 91.4 %   Platelets 204  150 - 400 K/uL  PROTIME-INR     Status: None   Collection Time    04/25/13 11:20 AM      Result Value Range   Prothrombin Time 12.2  11.6 - 15.2 seconds   INR 0.92  0.00 - 1.49   No results found.  Review of Systems  Constitutional: Negative for fever and weight loss.  Respiratory: Negative for cough and shortness of breath.   Cardiovascular: Negative for chest pain.  Gastrointestinal: Negative for nausea and vomiting.  Neurological: Negative for weakness and headaches.  Psychiatric/Behavioral: The patient is not nervous/anxious.     Blood pressure 157/71, pulse 66, temperature 97.8 F (36.6 C), temperature source Oral, resp. rate 18, height 5\' 5"  (1.651 m), weight 167 lb (75.751 kg), SpO2 98.00%. Physical Exam  Constitutional: He is oriented to person, place, and time. He appears well-developed and well-nourished.  Cardiovascular: Normal rate and regular rhythm.   No murmur  heard. Respiratory: Effort normal and breath sounds normal. He has no wheezes.  GI: Soft. Bowel sounds are normal. There is no tenderness.  Musculoskeletal: Normal range of motion.  Neurological: He is alert and oriented to person, place, and time. Coordination normal.  Skin: Skin is warm and dry.  Psychiatric: He has a normal mood and affect. His behavior is normal. Judgment and thought content normal.     Assessment/Plan Left lung and hilar mass +PET Bronchoscopy neg 03/2013 Pt now scheduled for lung/hilar mass biopsy Pt and family aware of procedure benefits and risks and agreeable to proceed Consent signed and in chart  Lizvette Lightsey A 04/25/2013, 12:14 PM

## 2013-04-28 DIAGNOSIS — C3490 Malignant neoplasm of unspecified part of unspecified bronchus or lung: Secondary | ICD-10-CM

## 2013-04-28 DIAGNOSIS — Z87891 Personal history of nicotine dependence: Secondary | ICD-10-CM

## 2013-04-28 DIAGNOSIS — I10 Essential (primary) hypertension: Secondary | ICD-10-CM

## 2013-04-28 DIAGNOSIS — E876 Hypokalemia: Secondary | ICD-10-CM

## 2013-04-28 DIAGNOSIS — I4892 Unspecified atrial flutter: Secondary | ICD-10-CM

## 2013-04-30 ENCOUNTER — Other Ambulatory Visit: Payer: Self-pay

## 2013-04-30 MED ORDER — DILTIAZEM HCL ER COATED BEADS 120 MG PO CP24: 120.0000 mg | ORAL_CAPSULE | Freq: Every day | ORAL | Status: DC

## 2013-04-30 NOTE — Telephone Encounter (Signed)
Requested Prescriptions   Signed Prescriptions Disp Refills  . diltiazem (CARDIZEM CD) 120 MG 24 hr capsule 90 capsule 3    Sig: Take 1 capsule (120 mg total) by mouth daily.    Authorizing Provider: GOLLAN, TIMOTHY J    Ordering User: LOPEZ, MARINA C    

## 2013-04-30 NOTE — Telephone Encounter (Signed)
Pt would like 90 day supply 

## 2013-05-04 ENCOUNTER — Encounter: Payer: Self-pay | Admitting: *Deleted

## 2013-05-10 ENCOUNTER — Ambulatory Visit: Payer: Self-pay | Admitting: Oncology

## 2013-05-10 HISTORY — PX: CARDIOVASCULAR STRESS TEST: SHX262

## 2013-05-15 ENCOUNTER — Institutional Professional Consult (permissible substitution) (INDEPENDENT_AMBULATORY_CARE_PROVIDER_SITE_OTHER): Payer: Medicare Other | Admitting: Cardiothoracic Surgery

## 2013-05-15 ENCOUNTER — Other Ambulatory Visit: Payer: Self-pay | Admitting: *Deleted

## 2013-05-15 VITALS — BP 167/80 | HR 70 | Resp 20 | Ht 65.0 in | Wt 167.0 lb

## 2013-05-15 DIAGNOSIS — C3490 Malignant neoplasm of unspecified part of unspecified bronchus or lung: Secondary | ICD-10-CM

## 2013-05-15 DIAGNOSIS — R222 Localized swelling, mass and lump, trunk: Secondary | ICD-10-CM

## 2013-05-15 DIAGNOSIS — R918 Other nonspecific abnormal finding of lung field: Secondary | ICD-10-CM

## 2013-05-15 NOTE — Progress Notes (Signed)
[address]       [place] [address]             [phone]                    Jason Malone [place] Medical Record #[id number] Date of Birth: 02/20/1941  Referring: Golden Pop, MD Primary Care: Golden Pop, MD  Chief Complaint:    Chief Complaint  Patient presents with  . Lung Mass    Surgical eval on Supra Hilar Mass    History of Present Illness:    Jason Malone 73 y.o. male is seen in the office  today at the request of Dr. Faith Rogue because of the incidental discovery of a left hilar mass. The patient presented to [facility] in November with a sudden and new onset of atrial fibrillation. At that time a chest x-ray showed a left hilar mass. The patient denies shortness of breath recently he has coughed up a few flecks of blood. Workup included bronchoscopy by Dr. Rich Number who describes nearly obstructing mass found proximally in the apical posterior segment of the left upper lobe , the mass was described as large bloody endobronchial exophytic friable fungating an infiltrative . Endobronchial biopsies did not reveal a definitive diagnosis . Patient's had a CT scan and PET scan MRI of the brain .screening pulmonary function studies but without gas her diffusing capacity have been done . Subsequently needle biopsy of the left lung mass showed typical carcinoid.  The patient has more than a 40 year smoking history smoking one and a half packs per day.  He quit in 2000 .   The patient denies any history of myocardial infarction or anginal chest. He is unaware of any further atrial fibrillation after his admission in November. He has had increasing difficulty with blood pressure control he certainly. His dose of Cardizem has been increased. He now notes increasing facial puffiness and periorbital edema.   the patient is referred for consideration of surgical resection, possibly require pneumonectomy.  Current Activity/ Functional Status:  Patient is  independent with mobility/ambulation, transfers, ADL's, IADL's.  Zubrod Score: At the time of surgery this patient's most appropriate activity status/level should be described as: []  Normal activity, no symptoms [x]  Symptoms, fully ambulatory []  Symptoms, in bed less than or equal to 50% of the time []  Symptoms, in bed greater than 50% of the time but less than 100% []  Bedridden []  Moribund   Past Medical History  Diagnosis Date  . Hypertension   . Hyperlipemia   . Full dentures   . Wears glasses   . Cancer   . Lung cancer   . Enlarged prostate   . A-fib   . Hypomagnesemia   . H/O hypokalemia     Past Surgical History  Procedure Laterality Date  . Multiple tooth extractions    . Tonsillectomy    . Colonoscopy w/ polypectomy      bleed after-had to go to surgery to stop bleeding via colonoscopy  . Dupuytren contracture release  12/15/2011    Procedure: DUPUYTREN CONTRACTURE RELEASE;  Surgeon: [name], MD;  Location: [facility];  Service: Orthopedics;  Laterality: Left;  Fasciotomy left ring finger dupuytrens  . Bronchoscopy      04/06/2013    Family History  Problem Relation Age of Onset  . Stroke Mother   . Hypertension Sister   . Hyperlipidemia Sister   . Hypertension Brother   .  Hyperlipidemia Brother   . Cancer Sister     lung and colon    History   Social History  . Marital Status: Married    Spouse Name: N/A    Number of Children: N/A  . Years of Education: N/A   Occupational History  .  patient is currently retired , but remains active doing a moderate amount of physical activity including gardening . 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   Social History Main Topics  . Smoking status: Former Smoker    Quit date: 12/09/1999  . Smokeless tobacco: Never Used  . Alcohol Use: No  . Drug Use: No  . Sexual Activity: Not on file      History    Smoking status  . Former Smoker  . Quit date: 12/09/1999  Smokeless tobacco  . Never Used    History  Alcohol Use No     No Known Allergies  Current Outpatient Prescriptions  Medication Sig Dispense Refill  . b complex vitamins capsule Take 1 capsule by mouth daily.      [name] diltiazem (CARDIZEM CD) 120 MG 24 hr capsule Take 1 capsule (120 mg total) by mouth daily.  90 capsule  3  . diphenhydrAMINE-zinc acetate (BENADRYL) cream Apply topically 3 (three) times daily as needed for itching.       [name] lisinopril (PRINIVIL,ZESTRIL) 10 MG tablet Take 10 mg by mouth daily.      . Multiple Vitamins-Minerals (MULTIVITAMIN WITH MINERALS) tablet Take 1 tablet by mouth daily.      . polycarbophil (FIBERCON) 625 MG tablet Take 625 mg by mouth daily.      . simvastatin (ZOCOR) 40 MG tablet Take 40 mg by mouth every morning.      . vitamin C (ASCORBIC ACID) 500 MG tablet Take 500 mg by mouth daily.       No current facility-administered medications for this visit.      Review of Systems:     Cardiac Review of Systems: Y or N  Chest Pain [ n   ]  Resting SOB [n   ] Exertional SOB  [  n]  Orthopnea Florencio.Farrier  ]   Pedal Edema Florencio.Farrier  ]    Palpitations [ y ] Syncope  [ n ]   Presyncope [ n  ]  General Review of Systems: [Y] = yes [  ]=no Constitional: recent weight change [  ];  Wt loss over the last 3 months [ none  ] anorexia [  ]; fatigue Blue.Reese  ]; nausea [  ]; night sweats [ n ]; fever [ n ]; or chills [n  ];          Dental: poor dentition[ n ]; Last Dentist visit:   Eye : blurred vision [  ]; diplopia [   ]; vision changes [  ];  Amaurosis fugax[  ]; Resp: cough Blue.Reese  ];  wheezing[n  ];  Hemoptysis[few flecks   ]; shortness of breath[n  ]; paroxysmal nocturnal dyspnea[ n ]; dyspnea on exertion[ n ]; or orthopnea[  ];  GI:  gallstones[  ], vomiting[  ];  dysphagia[  ]; melena[  ];  hematochezia [  ]; heartburn[  ];   Hx of  Colonoscopy[y  ]; GU: kidney stones [  ]; hematuria[  ];   dysuria [  ];  nocturia[  ];   history of  obstruction [  ]; urinary frequency [ n ]             Skin: rash, swelling[  ];, hair loss[  ];  peripheral edema[y  ];  or itching[  ]; some facial edema Musculosketetal: myalgias[ n ];  joint swelling[n  ];  joint erythema[  ];  joint pain[  ];  back pain[  ];  Heme/Lymph: bruising[  n];  bleeding[ n ];  anemia[ n ];  Neuro: TIA[n  ];  headaches[n ];  stroke[n ];  vertigo[n ];  seizures[n ];   paresthesias[ n ];  difficulty walking[ n ];  Psych:depression[  ]; anxiety[  ];  Endocrine: diabetes[  ];  thyroid dysfunction[  ];  Immunizations: Flu up to date [ y ]; Pneumococcal up to date [ y ];  Other:  Physical Exam: BP 167/80  Pulse 70  Resp 20  Ht 5\' 5"  (1.651 m)  Wt 167 lb (75.751 kg)  BMI 27.79 kg/m2  SpO2 96%  PHYSICAL EXAMINATION:   General appearance: alert, cooperative, appears stated age and no distress Neurologic: intact Heart: regular rate and rhythm, S1, S2 normal, no murmur, click, rub or gallop Lungs: clear to auscultation bilaterally Abdomen: soft, non-tender; bowel sounds normal; no masses,  no organomegaly Extremities: extremities normal, atraumatic, no cyanosis or edema and Homans sign is negative, no sign of DVT No carotid bruits, no cervical supraclavicular or axillary adenopathy Full DP and PT pulses bilaterally  Diagnostic Studies & Laboratory data:   PATH: Diagnosis Lung, needle/core biopsy(ies), Left upper lobe - POSITIVE FOR WELL DIFFERENTIATED NEUROENDOCRINE TUMOR (CARCINOID). - SEE COMMENT. Microscopic Comment Immunohistochemical stains are performed. The tumor is positive for synaptophysin, chromogranin and CD56. It is negative for TTF-1. The morphology coupled with the staining pattern is consistent with the above diagnosis. Please correlate with clinical and radiologic impression for primary tumor source.The findings are called to Dr. Nestor Lewandowsky on 04/27/13. Dr. Saralyn Pilar has seen this case in consultation with agreement of  the above diagnosis. (RAH:caf 04/27/13) Willeen Niece MD Pathologist, Electronic Signature (Case signed 04/27/2013)   Recent Radiology Findings:   Ct Biopsy  04/25/2013   CLINICAL DATA:  4.5 cm central left perihilar lung mass. Bronchoscopy has not been diagnostic for malignancy. The patient presents for percutaneous biopsy.  EXAM: CT GUIDED CORE BIOPSY OF LEFT LUNG  ANESTHESIA/SEDATION: 2.0  Mg IV Versed; 100 mcg IV Fentanyl  Total Moderate Sedation Time: 17 minutes.  COMPARISON:  Prior CT of the chest performed at [facility] on 03/28/2013 and PET scan on 04/17/2013.  PROCEDURE: The procedure risks, benefits, and alternatives were explained to the patient. Questions regarding the procedure were encouraged and answered. The patient understands and consents to the procedure.  The left posterior chest wall was prepped with Betadinein a sterile fashion, and a sterile drape was applied covering the operative field. A sterile gown and sterile gloves were used for the procedure. Local anesthesia was provided with 1% Lidocaine.  CT was performed in a prone position. Needle path planning was performed on a spiral acquisition. Under CT guidance, a 17 gauge needle was advanced to the level of a left perihilar lung mass from a posterior approach. After confirming needle tip position, a total of 3 separate 18 gauge core biopsy samples were obtained from the lesion. Material was submitted in formalin. Additional CT was performed. The outer needle was retracted. Additional CT images were performed.  Complications: No pneumothorax. Minimal pulmonary hemorrhage posterior to the lesion.  FINDINGS:  CT again demonstrates a central left lung mass centered in the perihilar/suprahilar region of the left upper lobe. The mass measures approximately 4 cm in greatest diameter. Solid tissue was obtained from the lesion. Post biopsy imaging shows a small amount of pulmonary hemorrhage posterior to the mass. This was not  associated with any hemoptysis clinically. After needle removal, CT shows no evidence of immediate pneumothorax.  IMPRESSION: CT-guided core biopsy performed successfully of the central left perihilar upper lobe lung mass. A minimal amount of adjacent pulmonary hemorrhage was present on completion of the procedure. The patient will be observed on bedrest for 3 hr following the procedure.   Electronically Signed   By: Aletta Edouard M.D.   On: 04/25/2013 17:21      Recent Lab Findings: Lab Results  Component Value Date   WBC 7.2 04/25/2013   HGB 16.9 04/25/2013   HCT 49.2 04/25/2013   PLT 204 04/25/2013   GLUCOSE 125* 12/15/2011   NA 141 12/15/2011   K 3.6 12/15/2011   CL 104 12/15/2011   CREATININE 1.10 12/15/2011   BUN 18 12/15/2011   INR 0.92 04/25/2013   CHEST 2 VIEW  COMPARISON: None.  FINDINGS: Two view exam of the chest shows vein 3.7 cm rounded central left suprahilar lesion concerning for neoplasm. There is a 6 mm nodule identified in the right mid lung. No edema or focal airspace consolidation. No evidence for pleural effusion. The cardiopericardial silhouette is within normal limits for size. Imaged bony structures of the thorax are intact.  IMPRESSION: 3.7 cm rounded density in the left suprahilar region, concerning for neoplasm. CT chest recommended with IV contrast, if possible, given the central location of this abnormality.  6 mm nodule in the right mid lung. Metastatic disease not excluded.  I personally discussed these results by telephone with Dr. Robet Leu at approximately 2046 hr on 03/28/2013  Electronically Signed By: Misty Stanley M.D. On: 03/28/2013 20:47  CT SCAN  CLINICAL DATA: Shortness of breath, evaluate for pulmonary embolism. Evaluate lung mass.  EXAM: CT ANGIOGRAPHY CHEST WITH CONTRAST  TECHNIQUE: Multidetector CT imaging of the chest was performed using the standard protocol during bolus administration of intravenous contrast. Multiplanar CT  image reconstructions including MIPs were obtained to evaluate the vascular anatomy.  CONTRAST: 100 cc of Isovue 370.  COMPARISON: Chest radiograph March 28, 2013  FINDINGS: Main pulmonary artery is not enlarged. No pulmonary arterial filling defects to the level of the subsegmental branches.  4.6 x 4.1 x 4.2 cm left hilar hypoenhancing mass encases but does not efface or narrows the left pulmonary artery branches. The mass is contiguous with the lingular bronchi which appearing narrowed though, are patent. No convincing evidence of endobronchial invasion.  Right upper lobe, posterior segment 6 mm calcified granuloma, which appears to correspond to radiograph abnormality. No suspicious pulmonary nodules, pleural effusions or focal consolidations, with particular attention to the right midlung, considering recent chest radiograph. 2 mm subpleural right upper lobe nodule.  No right hilar, mediastinal nor axillary lymphadenopathy. The heart and pericardium are nonsuspicious. Irregularity of the aortic arch associated with intimal hematoma and calcified plaque consistent with atherosclerosis without hemodynamically significant stenosis.  Thoracic esophagus is unremarkable. Included view of the abdomen is nonsuspicious, no discrete adrenal mass. Mild cortical thickening of the right adrenal gland. Tiny gallstones partially imaged. Soft tissues are nonsuspicious. Nonspecific 6 mm sclerotic lesion within the right posterior 6th rib, subcentimeter sclerotic lesions within the right aspect of vertebral body L1, which preserved the  overlying cortex.  Review of the MIP images confirms the above findings.  IMPRESSION: No pulmonary embolism nor acute cardiopulmonary process.  4.6 x 4.1 x 4.2 cm left hilar mass highly suspicious for primary lung neoplasm. Nodule seen on prior chest radiograph in the right hilar region corresponds to calcified granuloma. A few scattered subcentimeter  sclerotic foci within the axial skeleton fever bone islands though, considering lung mass, bone scan may be indicated.   Electronically Signed By: Elon Alas On: 03/28/2013 23:20  MRI of Brain: CLINICAL DATA: Lung cancer staging.  EXAM: MRI HEAD WITHOUT AND WITH CONTRAST  TECHNIQUE: Multiplanar, multiecho pulse sequences of the brain and surrounding structures were obtained without and with intravenous contrast.  CONTRAST: 15 cc MultiHance  COMPARISON: None.  FINDINGS: Scattered, punctate foci of T2 hyperintensity are present in the subcortical and deep cerebral white matter bilaterally, nonspecific but compatible with minimal, age-related small vessel ischemic disease. Small region of encephalomalacia in the anterior left temporal lobe is suggestive of remote infarct. There is no evidence of acute infarct, intracranial hemorrhage, mass, midline shift, or extra-axial fluid collection. Ventricles and sulci are within normal limits for age. There is no abnormal enhancement. Orbits are unremarkable. Bilateral ethmoid air cell opacification is present. Calvarium is normal in signal.  IMPRESSION: 1. No evidence of intracranial metastatic disease. 2. 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 3. Ethmoid air cell inflammatory mucosal disease.   Electronically Signed By: Logan Bores On: 04/18/2013 12:28  PET Scan: NUCLEAR MEDICINE PET SKULL BASE TO THIGH  FASTING BLOOD GLUCOSE: Value: 86mg /dl  TECHNIQUE: 12.8 mCi F-18 FDG was injected intravenously. CT data was obtained and used for attenuation correction and anatomic localization only. (This was not acquired as a diagnostic CT examination.) Additional exam technical data entered on technologist worksheet.  COMPARISON: Chest CT 03/28/2013.  FINDINGS: NECK  There is no hypermetabolic cervical nodal activity. No suspicious activity is seen associated with the pharyngeal mucosal  space. Prominent activity along the floor of the mouth is likely physiologic and related to talking.  CHEST  Left upper lobe suprahilar mass does not appear significantly changed, measuring approximately 3.7 x 3.6 cm on image 131. This mass demonstrates only low-level hypermetabolic activity with an SUV max of 3.7. Apart from this dominant mass which narrows the left upper lobe bronchus, no hypermetabolic mediastinal or central lung masses are identified. There are no other suspicious pulmonary nodules. There is a calcified right upper lobe granuloma. Mild atherosclerosis is noted. There is no pleural or pericardial effusion.  ABDOMEN/PELVIS  There is no abnormal metabolic activity within the liver, adrenal glands, spleen or pancreas. The left kidney demonstrates cortical thinning without hydronephrosis. There is mild aortoiliac atherosclerosis. Hepatic steatosis, cholelithiasis and sigmoid colon diverticulosis are noted. The prostate gland demonstrates moderate to marked enlargement.  SKELETON  There is no hypermetabolic osseous activity to suggest metastatic disease. Lower lumbar spondylosis is noted.  IMPRESSION: 1. The central left upper lobe/suprahilar mass demonstrates only low level hypermetabolic activity. This may related to low grade neoplasm. Correlation with prior biopsy results recommended. 2. No evidence of metastatic disease. 3. Stable incidental findings including hepatic steatosis and cholelithiasis. Mild sigmoid diverticulosis and moderate to marked enlargement of the prostate gland.   Electronically Signed By: Camie Patience M.D. On: 04/17/2013 15:40   Assessment / Plan:    Left hilar mass, well differentiated carcinoid a needle biopsy primarily involving the upper lobe but may involve the main pulmonary artery on CT scan. I discussed  surgical resection with the patient including the possibility of pneumonectomy. Prior to making a definitive decision  about this I've asked the patient to return to see Dr. Candis Musa, cardiology both for cardiology clearance and for better blood pressure control. We'll obtain a full set of pulmonary function studies  (previous pfts or spirometry only) with  diffusing capacity and ABG. The patient will be presented at the [facility] conference this week . I will plan to see the patient back next week after his visit with cardiology and PFTs.      I spent 60 minutes counseling the patient face to face. The total time spent in the appointment was 80 minutes.  [name] MD      [address] [place] [address] Office [phone]   Beeper [phone]  05/15/2013 4:40 PM

## 2013-05-15 NOTE — Patient Instructions (Signed)
Lung CancerLung Resection A lung resection is surgery to remove a lung. When an entire lung is removed, the procedure is called a pneumonectomy. When only part of a lung is removed, the procedure is called a lobectomy. A lung resection is typically done to get rid of a tumor or cancer. This surgery can help relieve some or all of your symptoms. The surgery can also help keep the problem from getting worse. It may provide the best chance for curing your disease. However, surgery may not necessarily cure lung cancer, if that is the problem. Most people need to stay in the hospital for several days after this procedure.  LET YOUR CAREGIVER KNOW ABOUT:  Allergies to food or medicine.  Medicines taken, including vitamins, herbs, eyedrops, over-the-counter medicines, and creams.  Use of steroids (by mouth or creams).  Previous problems with anesthetics or numbing medicines.  History of bleeding problems or blood clots.  Previous surgery.  Other health problems, including diabetes and kidney problems.  Possibility of pregnancy, if this applies. RISKS AND COMPLICATIONS  Lung resections have been done for many years with good results and few complications. However, all surgery is associated with possible risks. Some of these risks are:  Excessive bleeding.  Infection.  Inability to breath without a ventilator.  Persistent shortness of breath.  Heart problems, including abnormal rhythms and a risk of heart attack or heart failure.  Blood clots.  Injury to a blood vessel.  Injury to a nerve.  Failure to heal properly.  Stroke.  Bronchopleural fistula. This is a small hole between one of the main breathing tubes and the lining of the lungs. BEFORE THE PROCEDURE  In order to prepare for surgery, your caregiver may ask for several tests to be done. These may include:  Blood tests.  Urine tests.  X-rays.  Imaging tests, such as CT scans, MRI scans, and PET scans. These tests are  done to find the exact size and location of the tumor that will be removed.  Pulmonary function tests (PFTs). These are breathing tests to assess the function of your lungs before surgery and to decide how to best help your breathing after surgery.  Heart testing. This is done to make sure your heart is strong enough for the procedure.  Bronchoscopy. This is a technique that allows your caregiver to look at the inside of your airways. This is done using a soft, flexible tube (bronchoscope). Along with imaging tests, this can help your caregiver know the exact location and size of the area that will be removed during surgery.  Lymph node sampling. This may need to be done to see if the tumor has spread. It may be done as a separate surgery or right before your lung resection procedure. PROCEDURE  An intravenous line (IV) will be placed in your arm. You will be given medicine that makes you sleep (general anesthetic).  Once you are asleep, a breathing tube is placed into your windpipe. You may also get pain medicine through a thin, flexible tube (catheter) in your back. The catheter is put through your skin and next to your spinal cord, where it releases anesthetic medicine.  Next, you will be turned onto your side. This makes it easier for your surgeon to reach the area of your ribcage where the surgical cut (incision) will be made. This area is washed with a disinfectant solution and might also be shaved. A catheter will be put into your bladder to collect urine. Another tube will  be carefully passed through your throat and into your stomach.  The surgeon will make an incision on your side, which will start between two of your ribs and go around to your back. Your ribs will be spread and held open. Part of one rib may be removed to make it easier for the surgeon to reach your lung.  Your surgeon will carefully cut the veins, arteries, and bronchus leading to the lung. After being cut, each of  these pieces will be sewn or stapled closed. Then, the lung or part of the lung will be removed.  Your surgeon will check inside your chest to make sure there is no bleeding in or around the lungs. Lymph nodes near the lung may also be removed for later tests. This is done to check if your problems have spread to the lymph nodes.  Depending on your situation, your surgeon may put tubes into your chest to drain extra fluid and air from the chest cavity after surgery. After the tubes are in, your ribcage will be closed with stitches. The stitches help your ribcage heal and keep it from moving. After this, the layers of tissue under the skin are closed with more stitches, which will dissolve inside your body over time. Finally, your skin is closed with stitches or staples and covered with a bandage. AFTER THE PROCEDURE   After surgery, you will be taken to the recovery area where a nurse will monitor your progress. You may still have a breathing tube, spinal catheter, bladder catheter, stomach tube, and possibly chest tubes inside your body. These will be removed during your recovery. You may be put on a respirator following surgery if some assistance is needed to help your breathing. When you are awake, stable, and without complications, you will likely continue recovery in the intensive care unit (ICU).  As you wake up, you might feel some aches and pains in your chest and throat. Sometimes during recovery, patients may shiver or feel nauseous. Both of these symptoms are temporary and may be caused by the anesthesia. Your caregivers can give you medicine to help these problems go away.  The breathing tube will be taken out as soon as your caregivers feel you can breathe on your own. For most people, this happens on the same day as the surgery.  If your surgery and time in the ICU go well, most of the tubes and equipment will be taken out within the first 1 to 2 days after surgery. This is about how long  most people stay in the ICU. You may need to stay longer, depending on how you are doing.  You should also start respiratory therapy in the ICU. This therapy uses breathing exercises to help your other lung stay healthy and get stronger.  As you improve, you will be moved to a regular hospital room for continued respiratory therapy, help with your bladder and bowels, and to continue medicines. Most people stay in the hospital for 5 to 7 days. However, your stay may be longer, depending on how your surgery went and how well you are doing.  After your lung or part of your lung is taken out, there will be a space inside your chest. This space will often fill up with fluid over time. The amount of time this takes is different for each person. Because your chest needs to fill with fluid, your surgeon may or may not put a drainage tube in your chest. If there is a  chest tube, it will most likely be removed within 24 hours after the surgery.  You will receive care until you are doing well and your caregiver feels it is safe for you to go home or to transfer to an extended care facility. Document Released: 07/17/2002 Document Revised: 07/19/2011 Document Reviewed: 12/24/2010 [facility] Patient Information 2014 [place], [place]. Lung Resection Care After Refer to this sheet in the next few weeks. These instructions provide you with information on caring for yourself after your procedure. Your caregiver may also give you more specific instructions. Your treatment has been planned according to current medical practices, but problems sometimes occur. Call your caregiver if you have any problems or questions after your procedure. HOME CARE INSTRUCTIONS  You may resume a normal diet and activities as directed.  Do not smoke or use tobacco products.  Change your bandages (dressings) as directed.  Only take over-the-counter or prescription medicines for pain, discomfort, or fever as directed by your  caregiver.  Keep all follow-up appointments as directed.  Try to breathe deeply and cough as directed. Holding a pillow firmly over your ribs may help with discomfort.  If you were given an incentive spirometer in the hospital, continue to use it as directed.  Walk as directed by your caregiver.  You may take a shower and gently wash the area of your surgical cut (incision) with water and soap as directed. Do not use anything else to clean your incision except as directed by your caregiver. Do not take baths or sit in a hot tub. SEEK MEDICAL CARE IF:  You notice redness, swelling, or increasing pain in the incision.  You are bleeding from the incision.  You see pus coming from the incision.  You notice a bad smell coming from the incision or dressing.  Your incision breaks open.  You cough up blood or pus, or you develop a cough that produces bad smelling sputum.  You have pain or swelling in your legs.  You have increasing pain that is not controlled with medicine.  You have trouble managing any of the tubes that have been left in place after surgery. SEEK IMMEDIATE MEDICAL CARE IF:   You have a fever or chills.  You have any reaction or side effects to medicines given.  You have chest pain or an irregular or rapid heartbeat.  You have dizzy episodes or fainting.  You have shortness of breath or difficulty breathing.  You have persistent nausea or vomiting.  You have a rash. MAKE SURE YOU:  Understand these instructions.  Will watch your condition.  Will get help right away if you are not doing well or get worse. Document Released: 11/13/2004 Document Revised: 07/19/2011 Document Reviewed: 12/24/2010 [facility] Patient Information 2014 [place], [place].

## 2013-05-16 ENCOUNTER — Other Ambulatory Visit: Payer: Self-pay

## 2013-05-16 DIAGNOSIS — R222 Localized swelling, mass and lump, trunk: Secondary | ICD-10-CM

## 2013-05-18 ENCOUNTER — Encounter: Payer: Self-pay | Admitting: Cardiovascular Disease

## 2013-05-18 ENCOUNTER — Other Ambulatory Visit: Payer: Self-pay | Admitting: *Deleted

## 2013-05-18 ENCOUNTER — Inpatient Hospital Stay (HOSPITAL_COMMUNITY): Admission: RE | Admit: 2013-05-18 | Payer: Medicare Other | Source: Ambulatory Visit

## 2013-05-18 ENCOUNTER — Ambulatory Visit (INDEPENDENT_AMBULATORY_CARE_PROVIDER_SITE_OTHER): Payer: Medicare Other | Admitting: Cardiovascular Disease

## 2013-05-18 VITALS — BP 160/74 | HR 62 | Ht 65.5 in | Wt 174.8 lb

## 2013-05-18 DIAGNOSIS — I10 Essential (primary) hypertension: Secondary | ICD-10-CM

## 2013-05-18 DIAGNOSIS — C3490 Malignant neoplasm of unspecified part of unspecified bronchus or lung: Secondary | ICD-10-CM

## 2013-05-18 DIAGNOSIS — R079 Chest pain, unspecified: Secondary | ICD-10-CM

## 2013-05-18 DIAGNOSIS — I4892 Unspecified atrial flutter: Secondary | ICD-10-CM

## 2013-05-18 DIAGNOSIS — R0602 Shortness of breath: Secondary | ICD-10-CM

## 2013-05-18 MED ORDER — LISINOPRIL 10 MG PO TABS
20.0000 mg | ORAL_TABLET | Freq: Every day | ORAL | Status: DC
Start: 2013-05-18 — End: 2013-05-28

## 2013-05-18 MED ORDER — CARVEDILOL 12.5 MG PO TABS: 12.5000 mg | ORAL_TABLET | Freq: Two times a day (BID) | ORAL | Status: DC

## 2013-05-18 MED ORDER — CLONIDINE HCL 0.1 MG PO TABS
0.1000 mg | ORAL_TABLET | Freq: Two times a day (BID) | ORAL | Status: DC
Start: 2013-05-18 — End: 2013-08-30

## 2013-05-18 NOTE — Patient Instructions (Addendum)
You are doing well. Hold the diltiazem Start coreg/carvedilol 1 pill twice a day (12.5) Start clonidine 0.1 mg twice a day  Monitor your blood pressure at home If your blood pressure runs high,  We can increase the clonidine up to 0.2 mg twice a day Other option would be to take an extra lisinopril  We will schedule you for a stress test for shortness of breath  Please call us if you have new issues that need to be addressed before your next appt.  Your physician wants you to follow-up in: 6 months.  You will receive a reminder letter in the mail two months in advance. If you don't receive a letter, please call our office to schedule the follow-up appointment.  [place]  Your caregiver has ordered a Stress Test with nuclear imaging. The purpose of this test is to evaluate the blood supply to your heart muscle. This procedure is referred to as a "Non-Invasive Stress Test." This is because other than having an IV started in your vein, nothing is inserted or "invades" your body. Cardiac stress tests are done to find areas of poor blood flow to the heart by determining the extent of coronary artery disease (CAD). Some patients exercise on a treadmill, which naturally increases the blood flow to your heart, while others who are  unable to walk on a treadmill due to physical limitations have a pharmacologic/chemical stress agent called Lexiscan . This medicine will mimic walking on a treadmill by temporarily increasing your coronary blood flow.   Please note: these test may take anywhere between 2-4 hours to complete  PLEASE REPORT TO [facility] AT THE FIRST DESK WILL DIRECT YOU WHERE TO GO  Date of Procedure:________Tuesday, Jan 13_______________  Arrival Time for Procedure:________10:00am________________  Instructions regarding medication:   __X__:  Hold medications as follows:  DO NOT TAKE COREG OR CLONIDINE THE MORNING OF PROCEDURE   PLEASE NOTIFY  THE OFFICE AT LEAST 24 HOURS IN ADVANCE IF YOU ARE UNABLE TO KEEP YOUR APPOINTMENT.  [phone] AND  PLEASE NOTIFY NUCLEAR MEDICINE AT [facility] AT LEAST 24 HOURS IN ADVANCE IF YOU ARE UNABLE TO KEEP YOUR APPOINTMENT. [phone]  How to prepare for your Myoview test:  1. Do not eat or drink after midnight 2. No caffeine for 24 hours prior to test 3. No smoking 24 hours prior to test. 4. Your medication may be taken with water.  If your doctor stopped a medication because of this test, do not take that medication. 5. Ladies, please do not wear dresses.  Skirts or pants are appropriate. Please wear a short sleeve shirt. 6. No perfume, cologne or lotion. 7. Wear comfortable walking shoes. No heels!

## 2013-05-18 NOTE — Assessment & Plan Note (Signed)
Blood pressure elevated at home and on his visit today. We will hold the Cardizem as he reports having side effects. We will start the carvedilol twice a day, clonidine 0.1 mg twice a day, continue lisinopril.

## 2013-05-18 NOTE — Assessment & Plan Note (Signed)
I suspect his lung cancer is contributing to shortness of breath, chest tightness with exertion. Stress test ordered for symptoms and preoperative evaluation

## 2013-05-18 NOTE — Progress Notes (Signed)
Patient ID: Jason Malone, male    DOB: [birth year], 73 y.o.   MRN: [id number]  HPI Comments: Mr. Jason Malone is a 73 year old gentleman with long history of smoking,  lung cancer on the left, hypertension, hyperlipidemia who presented to the hospital 03/29/2013 with tachycardia, shortness of breath, atrial flutter. He was treated with rate controlling medications, and converted to normal sinus rhythm that night. He was discharged the next day. He did have significant electrolyte abnormality on arrival to the hospital with potassium 3.1, low magnesium felt secondary to his HCTZ. This was stopped in the hospital.   In followup, he reports that he  feels well. Denies any additional episodes of tachycardia. Recently completed second biopsy with diagnosis of carcinoid per the patient. He reports having a CT scan, PET scan, MRI He is recently seen Dr. Servando Snare for lobectomy for lung cancer.  He reports having side effects from a diltiazem/Cardizem with eye swelling, itching around his eyes. He would like to change this medication.  He has some shortness breath with exertion, also occasional chest tightness Blood pressure has also been running high at home typically in the 229 range systolic  Echocardiogram 79/89/2119 shows atrial flutter, ejection fraction 55-60%, otherwise normal study As mentioned, potassium on 03/29/2013 was 3.1 magnesium 1.6, cardiac enzymes negative, TSH 3.5  CT scan of the chest shows 4.6 x 4.1 x 4.2 cm left hilar mass encasing the left pulmonary artery branches Recent lab work showing total cholesterol 171, HDL 39, LDL 86  EKG today shows normal sinus rhythm with rate 62 beats per minute, no significant ST or T wave changes   Outpatient Encounter Prescriptions as of 05/18/2013  Medication Sig  . b complex vitamins capsule Take 1 capsule by mouth daily.  [name] diltiazem (CARDIZEM CD) 120 MG 24 hr capsule Take 1 capsule (120 mg total) by mouth daily.  [name] lisinopril (PRINIVIL,ZESTRIL) 10 MG  tablet Take 20 mg by mouth daily.   . Multiple Vitamins-Minerals (MULTIVITAMIN WITH MINERALS) tablet Take 1 tablet by mouth daily.  . polycarbophil (FIBERCON) 625 MG tablet Take 625 mg by mouth daily.  . simvastatin (ZOCOR) 40 MG tablet Take 40 mg by mouth every morning.  . vitamin C (ASCORBIC ACID) 500 MG tablet Take 500 mg by mouth daily.    Review of Systems  Constitutional: Negative.   HENT: Negative.   Eyes: Negative.   Respiratory: Positive for chest tightness and shortness of breath.   Cardiovascular: Negative.   Gastrointestinal: Negative.   Endocrine: Negative.   Musculoskeletal: Negative.   Skin: Negative.   Allergic/Immunologic: Negative.   Neurological: Negative.   Hematological: Negative.   Psychiatric/Behavioral: Negative.   All other systems reviewed and are negative.    BP 160/74  Pulse 62  Ht 5' 5.5" (1.664 m)  Wt 174 lb 12 oz (79.266 kg)  BMI 28.63 kg/m2  Physical Exam  Nursing note and vitals reviewed. Constitutional: He is oriented to person, place, and time. He appears well-developed and well-nourished.  HENT:  Head: Normocephalic.  Nose: Nose normal.  Mouth/Throat: Oropharynx is clear and moist.  Eyes: Conjunctivae are normal. Pupils are equal, round, and reactive to light.  Neck: Normal range of motion. Neck supple. No JVD present.  Cardiovascular: Normal rate, regular rhythm, S1 normal, S2 normal, normal heart sounds and intact distal pulses.  Exam reveals no gallop and no friction rub.   No murmur heard. Pulmonary/Chest: Effort normal. No respiratory distress. He has no rales. He exhibits no tenderness.  Abdominal: Soft.  Bowel sounds are normal. He exhibits no distension. There is no tenderness.  Musculoskeletal: Normal range of motion. He exhibits no edema and no tenderness.  Lymphadenopathy:    He has no cervical adenopathy.  Neurological: He is alert and oriented to person, place, and time. Coordination normal.  Skin: Skin is warm and dry.  No rash noted. No erythema.  Psychiatric: He has a normal mood and affect. His behavior is normal. Judgment and thought content normal.      Assessment and Plan

## 2013-05-18 NOTE — Assessment & Plan Note (Signed)
Maintaining normal sinus rhythm today. He would like to change his Cardizem. We will start carvedilol 12.5 mg twice a day for rhythm control

## 2013-05-21 ENCOUNTER — Encounter (HOSPITAL_COMMUNITY): Payer: Medicare Other

## 2013-05-21 ENCOUNTER — Ambulatory Visit: Payer: Medicare Other | Admitting: Cardiothoracic Surgery

## 2013-05-22 ENCOUNTER — Ambulatory Visit: Payer: Self-pay | Admitting: Cardiovascular Disease

## 2013-05-22 ENCOUNTER — Telehealth: Payer: Self-pay

## 2013-05-22 ENCOUNTER — Other Ambulatory Visit: Payer: Self-pay

## 2013-05-22 DIAGNOSIS — R0602 Shortness of breath: Secondary | ICD-10-CM

## 2013-05-22 DIAGNOSIS — R079 Chest pain, unspecified: Secondary | ICD-10-CM

## 2013-05-22 DIAGNOSIS — I10 Essential (primary) hypertension: Secondary | ICD-10-CM

## 2013-05-22 DIAGNOSIS — I4892 Unspecified atrial flutter: Secondary | ICD-10-CM

## 2013-05-22 NOTE — Telephone Encounter (Signed)
Patient called to cancel his follow up appointment for colonoscopy. He is sick with lung cancer and is now trying to deal with this and surgery. He will call back to reschedule.

## 2013-05-23 ENCOUNTER — Encounter (HOSPITAL_COMMUNITY): Payer: Medicare Other

## 2013-05-23 ENCOUNTER — Ambulatory Visit: Payer: Medicare Other | Admitting: Cardiothoracic Surgery

## 2013-05-28 ENCOUNTER — Ambulatory Visit (INDEPENDENT_AMBULATORY_CARE_PROVIDER_SITE_OTHER): Payer: Medicare Other | Admitting: Cardiothoracic Surgery

## 2013-05-28 ENCOUNTER — Encounter: Payer: Self-pay | Admitting: Cardiothoracic Surgery

## 2013-05-28 ENCOUNTER — Other Ambulatory Visit: Payer: Self-pay | Admitting: *Deleted

## 2013-05-28 VITALS — BP 127/73 | HR 56 | Resp 20 | Ht 65.5 in | Wt 174.0 lb

## 2013-05-28 DIAGNOSIS — R918 Other nonspecific abnormal finding of lung field: Secondary | ICD-10-CM

## 2013-05-28 DIAGNOSIS — R222 Localized swelling, mass and lump, trunk: Secondary | ICD-10-CM

## 2013-05-28 NOTE — Patient Instructions (Signed)
Take Coreg morning of surgery, but no other meds on Monday 1/26 Stop Lisinopril after dose Saturday 1/24  Nothing  by mouth after midnight Sunday (except Coreg Monday am)

## 2013-05-29 NOTE — Progress Notes (Signed)
[address]       [place] [address]             [phone]                    Timmey Blackshire [place] Medical Record #[id number] Date of Birth: 1940-11-28  Referring: Dr Genevive Bi, [facility] Primary Care: Golden Pop, MD  Chief Complaint:    Chief Complaint  Patient presents with  . Lung Mass    Left Lung Mass Carcinoid by needle bx    History of Present Illness:    Jason Malone 73 y.o. male is seen in the office  today at the request of Dr. Faith Rogue because of the incidental discovery of a left hilar mass. The patient presented to [facility] in November with a sudden and new onset of atrial fibrillation. At that time a chest x-ray showed a left hilar mass. The patient denies shortness of breath recently he has coughed up a few flecks of blood. Workup included bronchoscopy by Dr. Rich Number who describes nearly obstructing mass found proximally in the apical posterior segment of the left upper lobe , the mass was described as large bloody endobronchial exophytic friable fungating an infiltrative . Endobronchial biopsies did not reveal a definitive diagnosis . Patient's had a CT scan and PET scan MRI of the brain,  pulmonary function studies . Subsequently needle biopsy of the left lung mass showed typical carcinoid.  The patient has more than a 40 year smoking history smoking one and a half packs per day.  He quit in 2000 .   The patient denies any history of myocardial infarction or anginal chest. He is unaware of any further atrial fibrillation after his admission in November. He has had increasing difficulty with blood pressure control he certainly. His dose of Cardizem has been increased. He now notes increasing facial puffiness and periorbital edema.   the patient is referred for consideration of surgical resection, possibly require pneumonectomy.  Current Activity/ Functional Status:  Patient is independent with mobility/ambulation, transfers, ADL's,  IADL's.  Zubrod Score: At the time of surgery this patient's most appropriate activity status/level should be described as: []  Normal activity, no symptoms [x]  Symptoms, fully ambulatory []  Symptoms, in bed less than or equal to 50% of the time []  Symptoms, in bed greater than 50% of the time but less than 100% []  Bedridden []  Moribund   Past Medical History  Diagnosis Date  . Hypertension   . Hyperlipemia   . Full dentures   . Wears glasses   . Cancer   . Lung cancer   . Enlarged prostate   . A-fib   . Hypomagnesemia   . H/O hypokalemia     Past Surgical History  Procedure Laterality Date  . Multiple tooth extractions    . Tonsillectomy    . Colonoscopy w/ polypectomy      bleed after-had to go to surgery to stop bleeding via colonoscopy  . Dupuytren contracture release  12/15/2011    Procedure: DUPUYTREN CONTRACTURE RELEASE;  Surgeon: [name], MD;  Location: [place];  Service: Orthopedics;  Laterality: Left;  Fasciotomy left ring finger dupuytrens  . Bronchoscopy      04/06/2013    Family History  Problem Relation Age of Onset  . Stroke Mother   . Hypertension Sister   . Hyperlipidemia Sister   . Hypertension Brother   . Hyperlipidemia Brother   . Cancer Sister  lung and colon    History   Social History  . Marital Status: Married    Spouse Name: N/A    Number of Children: N/A  . Years of Education: N/A   Occupational History  .  patient is currently retired , but remains active doing a moderate amount of physical activity including gardening . 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   Social History Main Topics  . Smoking status: Former Smoker    Quit date: 12/09/1999  . Smokeless tobacco: Never Used  . Alcohol Use: No  . Drug Use: No  . Sexual Activity: Not on file      History  Smoking status  . Former Smoker  . Quit date:  12/09/1999  Smokeless tobacco  . Never Used    History  Alcohol Use No     Allergies  Allergen Reactions  . Latex Itching  . Tape Itching    Surgical tapes     Current Outpatient Prescriptions  Medication Sig Dispense Refill  . b complex vitamins capsule Take 1 capsule by mouth daily.      . carvedilol (COREG) 12.5 MG tablet Take 1 tablet (12.5 mg total) by mouth 2 (two) times daily.  60 tablet  3  . cloNIDine (CATAPRES) 0.1 MG tablet Take 1 tablet (0.1 mg total) by mouth 2 (two) times daily.  60 tablet  3  . Multiple Vitamins-Minerals (MULTIVITAMIN WITH MINERALS) tablet Take 1 tablet by mouth daily.      . polycarbophil (FIBERCON) 625 MG tablet Take 625 mg by mouth daily.      . simvastatin (ZOCOR) 40 MG tablet Take 40 mg by mouth every morning.      . vitamin C (ASCORBIC ACID) 500 MG tablet Take 500 mg by mouth daily.      [name] lisinopril (PRINIVIL,ZESTRIL) 10 MG tablet Take 10 mg by mouth daily.       No current facility-administered medications for this visit.      Review of Systems:     Cardiac Review of Systems: Y or N  Chest Pain [ n   ]  Resting SOB [n   ] Exertional SOB  [  n]  Orthopnea Florencio.Farrier  ]   Pedal Edema Florencio.Farrier  ]    Palpitations [ y ] Syncope  [ n ]   Presyncope [ n  ]  General Review of Systems: [Y] = yes [  ]=no Constitional: recent weight change [  ];  Wt loss over the last 3 months [ none  ] anorexia [  ]; fatigue Blue.Reese  ]; nausea [  ]; night sweats [ n ]; fever [ n ]; or chills [n  ];          Dental: poor dentition[ n ]; Last Dentist visit:   Eye : blurred vision [  ]; diplopia [   ]; vision changes [  ];  Amaurosis fugax[  ]; Resp: cough Blue.Reese  ];  wheezing[n  ];  Hemoptysis[few flecks   ]; shortness of breath[n  ]; paroxysmal nocturnal dyspnea[ n ]; dyspnea on exertion[ n ]; or orthopnea[  ];  GI:  gallstones[  ], vomiting[  ];  dysphagia[  ]; melena[  ];  hematochezia [  ]; heartburn[  ];   Hx of  Colonoscopy[y  ]; GU: kidney stones [  ]; hematuria[  ];   dysuria [   ];  nocturia[  ];  history of     obstruction [  ]; urinary frequency [ n ]             Skin: rash, swelling[  ];, hair loss[  ];  peripheral edema[y  ];  or itching[  ]; some facial edema Musculosketetal: myalgias[ n ];  joint swelling[n  ];  joint erythema[  ];  joint pain[  ];  back pain[  ];  Heme/Lymph: bruising[  n];  bleeding[ n ];  anemia[ n ];  Neuro: TIA[n  ];  headaches[n ];  stroke[n ];  vertigo[n ];  seizures[n ];   paresthesias[ n ];  difficulty walking[ n ];  Psych:depression[  ]; anxiety[  ];  Endocrine: diabetes[  ];  thyroid dysfunction[  ];  Immunizations: Flu up to date [ y ]; Pneumococcal up to date [ y ];  Other:  Physical Exam: BP 127/73  Pulse 56  Resp 20  Ht 5' 5.5" (1.664 m)  Wt 174 lb (78.926 kg)  BMI 28.50 kg/m2  SpO2 95%  PHYSICAL EXAMINATION:   General appearance: alert, cooperative, appears stated age and no distress Neurologic: intact Heart: regular rate and rhythm, S1, S2 normal, no murmur, click, rub or gallop Lungs: clear to auscultation bilaterally Abdomen: soft, non-tender; bowel sounds normal; no masses,  no organomegaly Extremities: extremities normal, atraumatic, no cyanosis or edema and Homans sign is negative, no sign of DVT No carotid bruits, no cervical supraclavicular or axillary adenopathy Full DP and PT pulses bilaterally  Diagnostic Studies & Laboratory data:   PATH: Diagnosis Lung, needle/core biopsy(ies), Left upper lobe - POSITIVE FOR WELL DIFFERENTIATED NEUROENDOCRINE TUMOR (CARCINOID). - SEE COMMENT. Microscopic Comment Immunohistochemical stains are performed. The tumor is positive for synaptophysin, chromogranin and CD56. It is negative for TTF-1. The morphology coupled with the staining pattern is consistent with the above diagnosis. Please correlate with clinical and radiologic impression for primary tumor source.The findings are called to Dr. Nestor Lewandowsky on 04/27/13. Dr. Saralyn Pilar has seen this case in consultation  with agreement of the above diagnosis. (RAH:caf 04/27/13) Willeen Niece MD Pathologist, Electronic Signature (Case signed 04/27/2013)   Recent Radiology Findings:   Ct Biopsy  04/25/2013   CLINICAL DATA:  4.5 cm central left perihilar lung mass. Bronchoscopy has not been diagnostic for malignancy. The patient presents for percutaneous biopsy.  EXAM: CT GUIDED CORE BIOPSY OF LEFT LUNG  ANESTHESIA/SEDATION: 2.0  Mg IV Versed; 100 mcg IV Fentanyl  Total Moderate Sedation Time: 17 minutes.  COMPARISON:  Prior CT of the chest performed at [facility] on 03/28/2013 and PET scan on 04/17/2013.  PROCEDURE: The procedure risks, benefits, and alternatives were explained to the patient. Questions regarding the procedure were encouraged and answered. The patient understands and consents to the procedure.  The left posterior chest wall was prepped with Betadinein a sterile fashion, and a sterile drape was applied covering the operative field. A sterile gown and sterile gloves were used for the procedure. Local anesthesia was provided with 1% Lidocaine.  CT was performed in a prone position. Needle path planning was performed on a spiral acquisition. Under CT guidance, a 17 gauge needle was advanced to the level of a left perihilar lung mass from a posterior approach. After confirming needle tip position, a total of 3 separate 18 gauge core biopsy samples were obtained from the lesion. Material was submitted in formalin. Additional CT was performed. The outer needle was retracted. Additional CT images were performed.  Complications: No  pneumothorax. Minimal pulmonary hemorrhage posterior to the lesion.  FINDINGS: CT again demonstrates a central left lung mass centered in the perihilar/suprahilar region of the left upper lobe. The mass measures approximately 4 cm in greatest diameter. Solid tissue was obtained from the lesion. Post biopsy imaging shows a small amount of pulmonary hemorrhage posterior to the mass.  This was not associated with any hemoptysis clinically. After needle removal, CT shows no evidence of immediate pneumothorax.  IMPRESSION: CT-guided core biopsy performed successfully of the central left perihilar upper lobe lung mass. A minimal amount of adjacent pulmonary hemorrhage was present on completion of the procedure. The patient will be observed on bedrest for 3 hr following the procedure.   Electronically Signed   By: Aletta Edouard M.D.   On: 04/25/2013 17:21      Recent Lab Findings: Lab Results  Component Value Date   WBC 7.2 04/25/2013   HGB 16.9 04/25/2013   HCT 49.2 04/25/2013   PLT 204 04/25/2013   GLUCOSE 125* 12/15/2011   NA 141 12/15/2011   K 3.6 12/15/2011   CL 104 12/15/2011   CREATININE 1.10 12/15/2011   BUN 18 12/15/2011   INR 0.92 04/25/2013   CHEST 2 VIEW  COMPARISON: None.  FINDINGS: Two view exam of the chest shows vein 3.7 cm rounded central left suprahilar lesion concerning for neoplasm. There is a 6 mm nodule identified in the right mid lung. No edema or focal airspace consolidation. No evidence for pleural effusion. The cardiopericardial silhouette is within normal limits for size. Imaged bony structures of the thorax are intact.  IMPRESSION: 3.7 cm rounded density in the left suprahilar region, concerning for neoplasm. CT chest recommended with IV contrast, if possible, given the central location of this abnormality.  6 mm nodule in the right mid lung. Metastatic disease not excluded.  I personally discussed these results by telephone with Dr. Robet Leu at approximately 2046 hr on 03/28/2013  Electronically Signed By: Misty Stanley M.D. On: 03/28/2013 20:47  CT SCAN  CLINICAL DATA: Shortness of breath, evaluate for pulmonary embolism. Evaluate lung mass.  EXAM: CT ANGIOGRAPHY CHEST WITH CONTRAST  TECHNIQUE: Multidetector CT imaging of the chest was performed using the standard protocol during bolus administration of intravenous contrast.  Multiplanar CT image reconstructions including MIPs were obtained to evaluate the vascular anatomy.  CONTRAST: 100 cc of Isovue 370.  COMPARISON: Chest radiograph March 28, 2013  FINDINGS: Main pulmonary artery is not enlarged. No pulmonary arterial filling defects to the level of the subsegmental branches.  4.6 x 4.1 x 4.2 cm left hilar hypoenhancing mass encases but does not efface or narrows the left pulmonary artery branches. The mass is contiguous with the lingular bronchi which appearing narrowed though, are patent. No convincing evidence of endobronchial invasion.  Right upper lobe, posterior segment 6 mm calcified granuloma, which appears to correspond to radiograph abnormality. No suspicious pulmonary nodules, pleural effusions or focal consolidations, with particular attention to the right midlung, considering recent chest radiograph. 2 mm subpleural right upper lobe nodule.  No right hilar, mediastinal nor axillary lymphadenopathy. The heart and pericardium are nonsuspicious. Irregularity of the aortic arch associated with intimal hematoma and calcified plaque consistent with atherosclerosis without hemodynamically significant stenosis.  Thoracic esophagus is unremarkable. Included view of the abdomen is nonsuspicious, no discrete adrenal mass. Mild cortical thickening of the right adrenal gland. Tiny gallstones partially imaged. Soft tissues are nonsuspicious. Nonspecific 6 mm sclerotic lesion within the right posterior 6th rib, subcentimeter sclerotic lesions within  the right aspect of vertebral body L1, which preserved the overlying cortex.  Review of the MIP images confirms the above findings.  IMPRESSION: No pulmonary embolism nor acute cardiopulmonary process.  4.6 x 4.1 x 4.2 cm left hilar mass highly suspicious for primary lung neoplasm. Nodule seen on prior chest radiograph in the right hilar region corresponds to calcified granuloma. A few  scattered subcentimeter sclerotic foci within the axial skeleton fever bone islands though, considering lung mass, bone scan may be indicated.   Electronically Signed By: Elon Alas On: 03/28/2013 23:20  MRI of Brain: CLINICAL DATA: Lung cancer staging.  EXAM: MRI HEAD WITHOUT AND WITH CONTRAST  TECHNIQUE: Multiplanar, multiecho pulse sequences of the brain and surrounding structures were obtained without and with intravenous contrast.  CONTRAST: 15 cc MultiHance  COMPARISON: None.  FINDINGS: Scattered, punctate foci of T2 hyperintensity are present in the subcortical and deep cerebral white matter bilaterally, nonspecific but compatible with minimal, age-related small vessel ischemic disease. Small region of encephalomalacia in the anterior left temporal lobe is suggestive of remote infarct. There is no evidence of acute infarct, intracranial hemorrhage, mass, midline shift, or extra-axial fluid collection. Ventricles and sulci are within normal limits for age. There is no abnormal enhancement. Orbits are unremarkable. Bilateral ethmoid air cell opacification is present. Calvarium is normal in signal.  IMPRESSION: 1. No evidence of intracranial metastatic disease. 2. 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 3. Ethmoid air cell inflammatory mucosal disease.   Electronically Signed By: Logan Bores On: 04/18/2013 12:28  PET Scan: NUCLEAR MEDICINE PET SKULL BASE TO THIGH  FASTING BLOOD GLUCOSE: Value: 86mg /dl  TECHNIQUE: 12.8 mCi F-18 FDG was injected intravenously. CT data was obtained and used for attenuation correction and anatomic localization only. (This was not acquired as a diagnostic CT examination.) Additional exam technical data entered on technologist worksheet.  COMPARISON: Chest CT 03/28/2013.  FINDINGS: NECK  There is no hypermetabolic cervical nodal activity. No suspicious activity is seen associated with the  pharyngeal mucosal space. Prominent activity along the floor of the mouth is likely physiologic and related to talking.  CHEST  Left upper lobe suprahilar mass does not appear significantly changed, measuring approximately 3.7 x 3.6 cm on image 131. This mass demonstrates only low-level hypermetabolic activity with an SUV max of 3.7. Apart from this dominant mass which narrows the left upper lobe bronchus, no hypermetabolic mediastinal or central lung masses are identified. There are no other suspicious pulmonary nodules. There is a calcified right upper lobe granuloma. Mild atherosclerosis is noted. There is no pleural or pericardial effusion.  ABDOMEN/PELVIS  There is no abnormal metabolic activity within the liver, adrenal glands, spleen or pancreas. The left kidney demonstrates cortical thinning without hydronephrosis. There is mild aortoiliac atherosclerosis. Hepatic steatosis, cholelithiasis and sigmoid colon diverticulosis are noted. The prostate gland demonstrates moderate to marked enlargement.  SKELETON  There is no hypermetabolic osseous activity to suggest metastatic disease. Lower lumbar spondylosis is noted.  IMPRESSION: 1. The central left upper lobe/suprahilar mass demonstrates only low level hypermetabolic activity. This may related to low grade neoplasm. Correlation with prior biopsy results recommended. 2. No evidence of metastatic disease. 3. Stable incidental findings including hepatic steatosis and cholelithiasis. Mild sigmoid diverticulosis and moderate to marked enlargement of the prostate gland.   Electronically Signed By: Camie Patience M.D. On: 04/17/2013 15:40    Assessment / Plan:    Left hilar mass, well differentiated carcinoid a needle biopsy primarily involving the upper lobe but  may involve the main pulmonary artery on CT scan. I discussed surgical resection with the patient including the possibility of pneumonectomy. Patient has been seen  by  Dr. Rockey Situ, cardiology both for cardiology clearance and for better blood pressure control. A myoview stress test was done "low risk" . Full set of pulmonary function studies have been done and scanned into Epic. The patient will be presented at the [facility] conference last week and all were in agreement to proceed with resection. I have discussed the recommendation to proceed with resection, possibly including left pneumonectomy. Risks and options  discussed in detail. Patient is agreeable, Plan for Jan 26 Monday    [name] MD      [address] [place] [address] Office [phone]   Beeper [phone]  05/29/2013 1:59 PM

## 2013-05-31 ENCOUNTER — Encounter (HOSPITAL_COMMUNITY)
Admission: RE | Admit: 2013-05-31 | Discharge: 2013-05-31 | Disposition: A | Discharge status: Home / Self Care | Payer: Medicare Other | Source: Ambulatory Visit | Attending: Cardiothoracic Surgery | Admitting: Cardiothoracic Surgery

## 2013-05-31 ENCOUNTER — Other Ambulatory Visit (HOSPITAL_COMMUNITY): Payer: Self-pay | Admitting: *Deleted

## 2013-05-31 ENCOUNTER — Encounter (HOSPITAL_COMMUNITY): Payer: Self-pay

## 2013-05-31 VITALS — BP 109/66 | HR 59 | Temp 99.4°F | Resp 20 | Ht 65.5 in | Wt 174.1 lb

## 2013-05-31 DIAGNOSIS — Z01812 Encounter for preprocedural laboratory examination: Secondary | ICD-10-CM | POA: Insufficient documentation

## 2013-05-31 DIAGNOSIS — R918 Other nonspecific abnormal finding of lung field: Secondary | ICD-10-CM

## 2013-05-31 DIAGNOSIS — Z01818 Encounter for other preprocedural examination: Secondary | ICD-10-CM | POA: Insufficient documentation

## 2013-05-31 HISTORY — DX: Diverticulitis of intestine, part unspecified, without perforation or abscess without bleeding: K57.92

## 2013-05-31 LAB — URINALYSIS, ROUTINE W REFLEX MICROSCOPIC
Bilirubin Urine: NEGATIVE
Glucose, UA: NEGATIVE mg/dL
Hgb urine dipstick: NEGATIVE
Ketones, ur: NEGATIVE mg/dL
Leukocytes, UA: NEGATIVE
Nitrite: NEGATIVE
Protein, ur: NEGATIVE mg/dL
Specific Gravity, Urine: 1.026 (ref 1.005–1.030)
Urobilinogen, UA: 0.2 mg/dL (ref 0.0–1.0)
pH: 5.5 (ref 5.0–8.0)

## 2013-05-31 LAB — CBC
HCT: 41.9 % (ref 39.0–52.0)
Hemoglobin: 14.4 g/dL (ref 13.0–17.0)
MCH: 31 pg (ref 26.0–34.0)
MCHC: 34.4 g/dL (ref 30.0–36.0)
MCV: 90.1 fL (ref 78.0–100.0)
Platelets: 160 10*3/uL (ref 150–400)
RBC: 4.65 MIL/uL (ref 4.22–5.81)
RDW: 14.5 % (ref 11.5–15.5)
WBC: 7.2 10*3/uL (ref 4.0–10.5)

## 2013-05-31 LAB — COMPREHENSIVE METABOLIC PANEL
ALT: 21 U/L (ref 0–53)
AST: 20 U/L (ref 0–37)
Albumin: 3.7 g/dL (ref 3.5–5.2)
Alkaline Phosphatase: 93 U/L (ref 39–117)
BUN: 18 mg/dL (ref 6–23)
CO2: 19 mEq/L (ref 19–32)
Calcium: 9.3 mg/dL (ref 8.4–10.5)
Chloride: 106 mEq/L (ref 96–112)
Creatinine, Ser: 0.91 mg/dL (ref 0.50–1.35)
GFR calc Af Amer: >90 mL/min (ref >90)
GFR calc non Af Amer: 84 mL/min — ABNORMAL LOW (ref >90)
Glucose, Bld: 106 mg/dL — ABNORMAL HIGH (ref 70–99)
Potassium: 4.1 mEq/L (ref 3.7–5.3)
Sodium: 141 mEq/L (ref 137–147)
Total Bilirubin: 0.3 mg/dL (ref 0.3–1.2)
Total Protein: 6.8 g/dL (ref 6.0–8.3)

## 2013-05-31 LAB — BLOOD GAS, ARTERIAL
Acid-Base Excess: 1.3 mmol/L (ref 0.0–2.0)
Bicarbonate: 25.1 mEq/L — ABNORMAL HIGH (ref 20.0–24.0)
Drawn by: 344381
O2 Saturation: 96.5 %
Patient temperature: 98.6
TCO2: 26.3 mmol/L (ref 0–100)
pCO2 arterial: 37.8 mmHg (ref 35.0–45.0)
pH, Arterial: 7.438 (ref 7.350–7.450)
pO2, Arterial: 79.8 mmHg — ABNORMAL LOW (ref 80.0–100.0)

## 2013-05-31 LAB — SURGICAL PCR SCREEN
MRSA, PCR: NEGATIVE
Staphylococcus aureus: NEGATIVE

## 2013-05-31 LAB — PROTIME-INR
INR: 0.99 (ref 0.00–1.49)
Prothrombin Time: 12.9 seconds (ref 11.6–15.2)

## 2013-05-31 LAB — ABO/RH: ABO/RH(D): A POS

## 2013-05-31 LAB — APTT: aPTT: 28 seconds (ref 24–37)

## 2013-05-31 NOTE — Progress Notes (Signed)
05/31/13 1607  OBSTRUCTIVE SLEEP APNEA  Have you ever been diagnosed with sleep apnea through a sleep study? No  Do you snore loudly (loud enough to be heard through closed doors)?  1  Do you often feel tired, fatigued, or sleepy during the daytime? 1  Has anyone observed you stop breathing during your sleep? 1  Do you have, or are you being treated for high blood pressure? 1  BMI more than 35 kg/m2? 0  Age over 71 years old? 1  Neck circumference greater than 40 cm/18 inches? 0  Gender: 1  Obstructive Sleep Apnea Score 6  Score 4 or greater  Results sent to PCP

## 2013-05-31 NOTE — Pre-Procedure Instructions (Signed)
Jason Malone  05/31/2013   Your procedure is scheduled on:  Monday, June 04, 2013 at 7:30 AM.   Report to [facility] Entrance "A" Admitting Office at 5:30 AM.   Call this number if you have problems the morning of surgery: [phone]   Remember:   Do not eat food or drink liquids after midnight Sunday, 06/03/13.   Take these medicines the morning of surgery with A SIP OF WATER: carvedilol (COREG), cloNIDine (CATAPRES)  Stop all Vitamins as of today.     Do not wear jewelry.  Do not wear lotions, powders, or cologne. You may wear deodorant.  Men may shave face and neck.  Do not bring valuables to the hospital.  [facility] is not responsible                  for any belongings or valuables.               Contacts, dentures or bridgework may not be worn into surgery.  Leave suitcase in the car. After surgery it may be brought to your room.  For patients admitted to the hospital, discharge time is determined by your                treatment team.             Special Instructions: Shower using CHG 2 nights before surgery and the night before surgery.  If you shower the day of surgery use CHG.  Use special wash - you have one bottle of CHG for all showers.  You should use approximately 1/3 of the bottle for each shower.   Please read over the following fact sheets that you were given: Pain Booklet, Coughing and Deep Breathing, Blood Transfusion Information, MRSA Information and Surgical Site Infection Prevention

## 2013-06-01 ENCOUNTER — Encounter (HOSPITAL_COMMUNITY): Payer: Self-pay | Admitting: Certified Registered Nurse Anesthetist

## 2013-06-03 ENCOUNTER — Encounter (HOSPITAL_COMMUNITY): Payer: Self-pay | Admitting: Certified Registered Nurse Anesthetist

## 2013-06-03 MED ORDER — DEXTROSE 5 % IV SOLN
1.5000 g | INTRAVENOUS | Status: AC
  Administered 2013-06-04 (×2): 1.5 g via INTRAVENOUS
  Filled 2013-06-03: qty 1.5

## 2013-06-04 ENCOUNTER — Encounter (HOSPITAL_COMMUNITY): Payer: Self-pay

## 2013-06-04 ENCOUNTER — Inpatient Hospital Stay (HOSPITAL_COMMUNITY): Payer: Medicare Other

## 2013-06-04 ENCOUNTER — Inpatient Hospital Stay (HOSPITAL_COMMUNITY): Payer: Medicare Other | Admitting: Certified Registered Nurse Anesthetist

## 2013-06-04 ENCOUNTER — Encounter (HOSPITAL_COMMUNITY)
Admission: RE | Disposition: A | Discharge status: Home / Self Care | Payer: Self-pay | Source: Ambulatory Visit | Attending: Cardiothoracic Surgery

## 2013-06-04 ENCOUNTER — Encounter (HOSPITAL_COMMUNITY): Payer: Medicare Other | Admitting: Certified Registered Nurse Anesthetist

## 2013-06-04 ENCOUNTER — Inpatient Hospital Stay (HOSPITAL_COMMUNITY)
Admission: RE | Admit: 2013-06-04 | Discharge: 2013-06-10 | DRG: 164 | Disposition: A | Discharge status: Home / Self Care | Payer: Medicare Other | Source: Ambulatory Visit | Attending: Cardiothoracic Surgery | Admitting: Cardiothoracic Surgery

## 2013-06-04 DIAGNOSIS — Z87891 Personal history of nicotine dependence: Secondary | ICD-10-CM

## 2013-06-04 DIAGNOSIS — N401 Enlarged prostate with lower urinary tract symptoms: Secondary | ICD-10-CM | POA: Diagnosis not present

## 2013-06-04 DIAGNOSIS — J9382 Other air leak: Secondary | ICD-10-CM | POA: Diagnosis not present

## 2013-06-04 DIAGNOSIS — R339 Retention of urine, unspecified: Secondary | ICD-10-CM | POA: Diagnosis not present

## 2013-06-04 DIAGNOSIS — C7A090 Malignant carcinoid tumor of the bronchus and lung: Principal | ICD-10-CM | POA: Diagnosis present

## 2013-06-04 DIAGNOSIS — I4892 Unspecified atrial flutter: Secondary | ICD-10-CM

## 2013-06-04 DIAGNOSIS — I10 Essential (primary) hypertension: Secondary | ICD-10-CM | POA: Diagnosis present

## 2013-06-04 DIAGNOSIS — N320 Bladder-neck obstruction: Secondary | ICD-10-CM | POA: Diagnosis present

## 2013-06-04 DIAGNOSIS — Z902 Acquired absence of lung [part of]: Secondary | ICD-10-CM

## 2013-06-04 DIAGNOSIS — R918 Other nonspecific abnormal finding of lung field: Secondary | ICD-10-CM

## 2013-06-04 DIAGNOSIS — C3410 Malignant neoplasm of upper lobe, unspecified bronchus or lung: Secondary | ICD-10-CM

## 2013-06-04 DIAGNOSIS — I4891 Unspecified atrial fibrillation: Secondary | ICD-10-CM | POA: Diagnosis present

## 2013-06-04 DIAGNOSIS — N138 Other obstructive and reflux uropathy: Secondary | ICD-10-CM | POA: Diagnosis not present

## 2013-06-04 DIAGNOSIS — E785 Hyperlipidemia, unspecified: Secondary | ICD-10-CM | POA: Diagnosis present

## 2013-06-04 DIAGNOSIS — D62 Acute posthemorrhagic anemia: Secondary | ICD-10-CM | POA: Diagnosis not present

## 2013-06-04 DIAGNOSIS — E876 Hypokalemia: Secondary | ICD-10-CM | POA: Diagnosis not present

## 2013-06-04 HISTORY — PX: VIDEO BRONCHOSCOPY: SHX5072

## 2013-06-04 HISTORY — PX: VIDEO ASSISTED THORACOSCOPY (VATS)/WEDGE RESECTION: SHX6174

## 2013-06-04 LAB — PREPARE RBC (CROSSMATCH)

## 2013-06-04 SURGERY — BRONCHOSCOPY, VIDEO-ASSISTED
Anesthesia: General | Site: Chest

## 2013-06-04 MED ORDER — PROPOFOL 10 MG/ML IV BOLUS
INTRAVENOUS | Status: AC
  Filled 2013-06-04: qty 20

## 2013-06-04 MED ORDER — LIDOCAINE HCL (CARDIAC) 20 MG/ML IV SOLN
INTRAVENOUS | Status: AC
  Filled 2013-06-04: qty 5

## 2013-06-04 MED ORDER — ACETAMINOPHEN 160 MG/5ML PO SOLN: 1000.0000 mg | Freq: Four times a day (QID) | ORAL | Status: AC

## 2013-06-04 MED ORDER — ONDANSETRON HCL 4 MG/2ML IJ SOLN: 4.0000 mg | Freq: Four times a day (QID) | INTRAMUSCULAR | Status: DC | PRN

## 2013-06-04 MED ORDER — GLYCOPYRROLATE 0.2 MG/ML IJ SOLN
INTRAMUSCULAR | Status: DC | PRN
  Administered 2013-06-04: 1 mg via INTRAVENOUS

## 2013-06-04 MED ORDER — HYDROMORPHONE HCL PF 1 MG/ML IJ SOLN
0.2500 mg | INTRAMUSCULAR | Status: DC | PRN
  Administered 2013-06-04: 0.5 mg via INTRAVENOUS

## 2013-06-04 MED ORDER — HEMOSTATIC AGENTS (NO CHARGE) OPTIME
TOPICAL | Status: DC | PRN
  Administered 2013-06-04: 1 via TOPICAL

## 2013-06-04 MED ORDER — ROCURONIUM BROMIDE 50 MG/5ML IV SOLN
INTRAVENOUS | Status: AC
  Filled 2013-06-04: qty 1

## 2013-06-04 MED ORDER — SODIUM CHLORIDE 0.9 % IJ SOLN: 9.0000 mL | INTRAMUSCULAR | Status: DC | PRN

## 2013-06-04 MED ORDER — ONDANSETRON HCL 4 MG/2ML IJ SOLN
INTRAMUSCULAR | Status: AC
  Filled 2013-06-04: qty 2

## 2013-06-04 MED ORDER — POTASSIUM CHLORIDE 10 MEQ/50ML IV SOLN: 10.0000 meq | Freq: Every day | INTRAVENOUS | Status: DC | PRN

## 2013-06-04 MED ORDER — DEXTROSE 5 % IV SOLN
1.5000 g | INTRAVENOUS | Status: DC
  Filled 2013-06-04: qty 1.5

## 2013-06-04 MED ORDER — OXYCODONE HCL 5 MG/5ML PO SOLN: 5.0000 mg | Freq: Once | ORAL | Status: DC | PRN

## 2013-06-04 MED ORDER — LIDOCAINE HCL (CARDIAC) 20 MG/ML IV SOLN
INTRAVENOUS | Status: DC | PRN
  Administered 2013-06-04: 60 mg via INTRAVENOUS

## 2013-06-04 MED ORDER — PROPOFOL 10 MG/ML IV BOLUS
INTRAVENOUS | Status: DC | PRN
  Administered 2013-06-04: 100 mg via INTRAVENOUS

## 2013-06-04 MED ORDER — CALCIUM POLYCARBOPHIL 625 MG PO TABS
625.0000 mg | ORAL_TABLET | Freq: Every day | ORAL | Status: DC
  Administered 2013-06-05 – 2013-06-10 (×6): 625 mg via ORAL
  Filled 2013-06-04 (×6): qty 1

## 2013-06-04 MED ORDER — BUPIVACAINE ON-Q PAIN PUMP (FOR ORDER SET NO CHG)
INJECTION | Status: AC
Start: 2013-06-04 — End: 2013-06-07
  Filled 2013-06-04: qty 1

## 2013-06-04 MED ORDER — BISACODYL 5 MG PO TBEC
10.0000 mg | DELAYED_RELEASE_TABLET | Freq: Every day | ORAL | Status: DC
  Administered 2013-06-04 – 2013-06-10 (×6): 10 mg via ORAL
  Filled 2013-06-04 (×6): qty 2

## 2013-06-04 MED ORDER — GLYCOPYRROLATE 0.2 MG/ML IJ SOLN
INTRAMUSCULAR | Status: AC
  Filled 2013-06-04: qty 2

## 2013-06-04 MED ORDER — ACETAMINOPHEN 500 MG PO TABS
1000.0000 mg | ORAL_TABLET | Freq: Four times a day (QID) | ORAL | Status: AC
  Administered 2013-06-04 – 2013-06-05 (×4): 1000 mg via ORAL
  Filled 2013-06-04 (×3): qty 2

## 2013-06-04 MED ORDER — MIDAZOLAM HCL 5 MG/5ML IJ SOLN
INTRAMUSCULAR | Status: DC | PRN
  Administered 2013-06-04 (×2): 1 mg via INTRAVENOUS

## 2013-06-04 MED ORDER — NEOSTIGMINE METHYLSULFATE 1 MG/ML IJ SOLN
INTRAMUSCULAR | Status: DC | PRN
  Administered 2013-06-04: 5 mg via INTRAVENOUS

## 2013-06-04 MED ORDER — METOCLOPRAMIDE HCL 10 MG PO TABS
10.0000 mg | ORAL_TABLET | Freq: Four times a day (QID) | ORAL | Status: AC
  Administered 2013-06-04 – 2013-06-05 (×2): 10 mg via ORAL
  Filled 2013-06-04 (×4): qty 1

## 2013-06-04 MED ORDER — VANCOMYCIN HCL IN DEXTROSE 1-5 GM/200ML-% IV SOLN: 1000.0000 mg | Freq: Two times a day (BID) | INTRAVENOUS | Status: DC

## 2013-06-04 MED ORDER — ROCURONIUM BROMIDE 50 MG/5ML IV SOLN
INTRAVENOUS | Status: AC
  Filled 2013-06-04: qty 2

## 2013-06-04 MED ORDER — NEOSTIGMINE METHYLSULFATE 1 MG/ML IJ SOLN
INTRAMUSCULAR | Status: AC
  Filled 2013-06-04: qty 10

## 2013-06-04 MED ORDER — ONDANSETRON HCL 4 MG/2ML IJ SOLN: 4.0000 mg | Freq: Once | INTRAMUSCULAR | Status: DC | PRN

## 2013-06-04 MED ORDER — LISINOPRIL 10 MG PO TABS
10.0000 mg | ORAL_TABLET | Freq: Every day | ORAL | Status: DC
  Administered 2013-06-04: 10 mg via ORAL
  Filled 2013-06-04 (×2): qty 1

## 2013-06-04 MED ORDER — LACTATED RINGERS IV SOLN
INTRAVENOUS | Status: DC | PRN
  Administered 2013-06-04 (×2): via INTRAVENOUS

## 2013-06-04 MED ORDER — OXYCODONE-ACETAMINOPHEN 5-325 MG PO TABS
1.0000 | ORAL_TABLET | ORAL | Status: DC | PRN
  Administered 2013-06-05: 1 via ORAL
  Administered 2013-06-09 (×2): 2 via ORAL
  Administered 2013-06-09 – 2013-06-10 (×2): 1 via ORAL
  Filled 2013-06-04: qty 1
  Filled 2013-06-04: qty 2
  Filled 2013-06-04: qty 1
  Filled 2013-06-04: qty 2
  Filled 2013-06-04: qty 1

## 2013-06-04 MED ORDER — LEVALBUTEROL HCL 0.63 MG/3ML IN NEBU
0.6300 mg | INHALATION_SOLUTION | Freq: Four times a day (QID) | RESPIRATORY_TRACT | Status: DC
Start: 2013-06-04 — End: 2013-06-05
  Administered 2013-06-04 – 2013-06-05 (×5): 0.63 mg via RESPIRATORY_TRACT
  Filled 2013-06-04 (×9): qty 3

## 2013-06-04 MED ORDER — KCL IN DEXTROSE-NACL 10-5-0.45 MEQ/L-%-% IV SOLN
INTRAVENOUS | Status: DC
  Administered 2013-06-04: 100 mL/h via INTRAVENOUS
  Administered 2013-06-05: 05:00:00 via INTRAVENOUS
  Filled 2013-06-04 (×3): qty 1000

## 2013-06-04 MED ORDER — FENTANYL 10 MCG/ML IV SOLN
INTRAVENOUS | Status: DC
  Administered 2013-06-04: 17:00:00 via INTRAVENOUS
  Administered 2013-06-04: 5 ug via INTRAVENOUS
  Administered 2013-06-05: 20 ug via INTRAVENOUS
  Administered 2013-06-05: 40 ug via INTRAVENOUS
  Administered 2013-06-05: 120 ug via INTRAVENOUS
  Administered 2013-06-05: 130 ug via INTRAVENOUS
  Administered 2013-06-05: 50 ug via INTRAVENOUS
  Administered 2013-06-05: 40 ug via INTRAVENOUS
  Administered 2013-06-05: 20 ug via INTRAVENOUS
  Administered 2013-06-06: 50 ug via INTRAVENOUS
  Administered 2013-06-06: 30 ug via INTRAVENOUS
  Administered 2013-06-06: 100 ug via INTRAVENOUS
  Administered 2013-06-06: 10 ug/h via INTRAVENOUS
  Administered 2013-06-06: 03:00:00 via INTRAVENOUS
  Administered 2013-06-06 – 2013-06-07 (×2): 50 ug via INTRAVENOUS
  Administered 2013-06-07: 80 ug via INTRAVENOUS
  Administered 2013-06-07: 10 ug via INTRAVENOUS
  Administered 2013-06-07: 30 ug via INTRAVENOUS
  Administered 2013-06-07: 90 ug via INTRAVENOUS
  Administered 2013-06-07: 30 ug via INTRAVENOUS
  Administered 2013-06-07: 18:00:00 via INTRAVENOUS
  Administered 2013-06-08: 70 ug via INTRAVENOUS
  Administered 2013-06-08: 20 ug via INTRAVENOUS
  Administered 2013-06-08: 50 ug via INTRAVENOUS
  Administered 2013-06-08: 30 ug via INTRAVENOUS
  Administered 2013-06-08: 70 ug via INTRAVENOUS
  Administered 2013-06-08: 40 ug via INTRAVENOUS
  Administered 2013-06-08: 50 ug via INTRAVENOUS
  Administered 2013-06-09: 90 ug via INTRAVENOUS
  Administered 2013-06-09: 20 ug via INTRAVENOUS
  Administered 2013-06-09: 50 ug via INTRAVENOUS
  Administered 2013-06-09: 10:00:00 via INTRAVENOUS
  Filled 2013-06-04 (×4): qty 50

## 2013-06-04 MED ORDER — ROCURONIUM BROMIDE 100 MG/10ML IV SOLN
INTRAVENOUS | Status: DC | PRN
  Administered 2013-06-04 (×10): 10 mg via INTRAVENOUS
  Administered 2013-06-04: 50 mg via INTRAVENOUS
  Administered 2013-06-04: 10 mg via INTRAVENOUS

## 2013-06-04 MED ORDER — FENTANYL CITRATE 0.05 MG/ML IJ SOLN
INTRAMUSCULAR | Status: DC | PRN
  Administered 2013-06-04 (×6): 50 ug via INTRAVENOUS
  Administered 2013-06-04: 100 ug via INTRAVENOUS
  Administered 2013-06-04 (×8): 50 ug via INTRAVENOUS

## 2013-06-04 MED ORDER — BUPIVACAINE 0.5 % ON-Q PUMP SINGLE CATH 400 ML
400.0000 mL | INJECTION | Status: DC
  Filled 2013-06-04: qty 400

## 2013-06-04 MED ORDER — FENTANYL CITRATE 0.05 MG/ML IJ SOLN
INTRAMUSCULAR | Status: AC
  Filled 2013-06-04: qty 5

## 2013-06-04 MED ORDER — OXYCODONE HCL 5 MG PO TABS: 5.0000 mg | ORAL_TABLET | Freq: Once | ORAL | Status: DC | PRN

## 2013-06-04 MED ORDER — HYDROMORPHONE HCL PF 1 MG/ML IJ SOLN
INTRAMUSCULAR | Status: AC
  Filled 2013-06-04: qty 1

## 2013-06-04 MED ORDER — NALOXONE HCL 0.4 MG/ML IJ SOLN: 0.4000 mg | INTRAMUSCULAR | Status: DC | PRN

## 2013-06-04 MED ORDER — DIPHENHYDRAMINE HCL 12.5 MG/5ML PO ELIX
12.5000 mg | ORAL_SOLUTION | Freq: Four times a day (QID) | ORAL | Status: DC | PRN
  Filled 2013-06-04: qty 5

## 2013-06-04 MED ORDER — DIPHENHYDRAMINE HCL 50 MG/ML IJ SOLN: 12.5000 mg | Freq: Four times a day (QID) | INTRAMUSCULAR | Status: DC | PRN

## 2013-06-04 MED ORDER — ONDANSETRON HCL 4 MG/2ML IJ SOLN
INTRAMUSCULAR | Status: DC | PRN
  Administered 2013-06-04: 4 mg via INTRAVENOUS

## 2013-06-04 MED ORDER — ONDANSETRON HCL 4 MG/2ML IJ SOLN
4.0000 mg | Freq: Four times a day (QID) | INTRAMUSCULAR | Status: DC | PRN
  Filled 2013-06-04: qty 2

## 2013-06-04 MED ORDER — DEXTROSE 5 % IV SOLN
1.5000 g | Freq: Two times a day (BID) | INTRAVENOUS | Status: AC
  Administered 2013-06-05 (×2): 1.5 g via INTRAVENOUS
  Filled 2013-06-04 (×2): qty 1.5

## 2013-06-04 MED ORDER — LACTATED RINGERS IV SOLN
INTRAVENOUS | Status: DC | PRN
  Administered 2013-06-04: 07:00:00 via INTRAVENOUS

## 2013-06-04 MED ORDER — SIMVASTATIN 40 MG PO TABS
40.0000 mg | ORAL_TABLET | Freq: Every morning | ORAL | Status: DC
  Administered 2013-06-05 – 2013-06-06 (×2): 40 mg via ORAL
  Filled 2013-06-04 (×3): qty 1

## 2013-06-04 MED ORDER — EPHEDRINE SULFATE 50 MG/ML IJ SOLN
INTRAMUSCULAR | Status: AC
  Filled 2013-06-04: qty 1

## 2013-06-04 MED ORDER — CARVEDILOL 12.5 MG PO TABS
12.5000 mg | ORAL_TABLET | Freq: Two times a day (BID) | ORAL | Status: DC
  Administered 2013-06-04: 12.5 mg via ORAL
  Filled 2013-06-04 (×3): qty 1

## 2013-06-04 MED ORDER — MIDAZOLAM HCL 2 MG/2ML IJ SOLN
INTRAMUSCULAR | Status: AC
  Filled 2013-06-04: qty 2

## 2013-06-04 MED ORDER — GLYCOPYRROLATE 0.2 MG/ML IJ SOLN
INTRAMUSCULAR | Status: AC
  Filled 2013-06-04: qty 3

## 2013-06-04 SURGICAL SUPPLY — 94 items
APPLICATOR TIP COSEAL (VASCULAR PRODUCTS) IMPLANT
APPLICATOR TIP EXT COSEAL (VASCULAR PRODUCTS) IMPLANT
BLADE SURG 11 STRL SS (BLADE) ×4 IMPLANT
BLADE SURG 15 STRL LF DISP TIS (BLADE) ×3 IMPLANT
BLADE SURG 15 STRL SS (BLADE) ×1
BRUSH CYTOL CELLEBRITY 1.5X140 (MISCELLANEOUS) IMPLANT
CANISTER SUCTION 2500CC (MISCELLANEOUS) ×4 IMPLANT
CATH FOLEY LATEX FREE 16FR (CATHETERS) ×1
CATH FOLEY LF 16FR (CATHETERS) ×3 IMPLANT
CATH KIT ON Q 5IN SLV (PAIN MANAGEMENT) ×4 IMPLANT
CATH KIT ON-Q SILVERSOAKER 2.5 (CATHETERS) ×4 IMPLANT
CATH THORACIC 28FR (CATHETERS) IMPLANT
CATH THORACIC 36FR (CATHETERS) IMPLANT
CATH THORACIC 36FR RT ANG (CATHETERS) IMPLANT
CLIP TI MEDIUM 24 (CLIP) ×4 IMPLANT
CLIP TI MEDIUM 6 (CLIP) ×4 IMPLANT
CLIP TI WIDE RED SMALL 24 (CLIP) ×4 IMPLANT
CONN ST 1/4X3/8  BEN (MISCELLANEOUS)
CONN ST 1/4X3/8 BEN (MISCELLANEOUS) IMPLANT
CONT SPEC 4OZ CLIKSEAL STRL BL (MISCELLANEOUS) ×24 IMPLANT
COVER TABLE BACK 60X90 (DRAPES) ×4 IMPLANT
DRAIN CHANNEL 28F RND 3/8 FF (WOUND CARE) IMPLANT
DRAIN CHANNEL 32F RND 10.7 FF (WOUND CARE) IMPLANT
DRAPE LAPAROSCOPIC ABDOMINAL (DRAPES) ×4 IMPLANT
DRAPE WARM FLUID 44X44 (DRAPE) ×4 IMPLANT
DRILL BIT 7/64X5 (BIT) ×4 IMPLANT
DRSG AQUACEL AG ADV 3.5X10 (GAUZE/BANDAGES/DRESSINGS) ×4 IMPLANT
ELECT BLADE 4.0 EZ CLEAN MEGAD (MISCELLANEOUS) ×4
ELECT REM PT RETURN 9FT ADLT (ELECTROSURGICAL) ×4
ELECTRODE BLDE 4.0 EZ CLN MEGD (MISCELLANEOUS) ×3 IMPLANT
ELECTRODE REM PT RTRN 9FT ADLT (ELECTROSURGICAL) ×3 IMPLANT
FORCEPS BIOP RJ4 1.8 (CUTTING FORCEPS) IMPLANT
GLOVE BIO SURGEON STRL SZ 6.5 (GLOVE) ×8 IMPLANT
GLOVE SURG SS PI 6.5 STRL IVOR (GLOVE) ×8 IMPLANT
GOWN STRL NON-REIN LRG LVL3 (GOWN DISPOSABLE) ×16 IMPLANT
HANDLE UNIV GIA SHORT (MISCELLANEOUS) ×8 IMPLANT
HEMOSTAT SURGICEL 2X14 (HEMOSTASIS) ×4 IMPLANT
KIT BASIN OR (CUSTOM PROCEDURE TRAY) ×4 IMPLANT
KIT ROOM TURNOVER OR (KITS) ×4 IMPLANT
KIT SUCTION CATH 14FR (SUCTIONS) ×4 IMPLANT
MARKER SKIN DUAL TIP RULER LAB (MISCELLANEOUS) ×4 IMPLANT
NEEDLE BIOPSY TRANSBRONCH 21G (NEEDLE) IMPLANT
NS IRRIG 1000ML POUR BTL (IV SOLUTION) ×8 IMPLANT
OIL SILICONE PENTAX (PARTS (SERVICE/REPAIRS)) ×4 IMPLANT
PACK CHEST (CUSTOM PROCEDURE TRAY) ×4 IMPLANT
PAD ARMBOARD 7.5X6 YLW CONV (MISCELLANEOUS) ×12 IMPLANT
PASSER SUT SWANSON 36MM LOOP (INSTRUMENTS) ×4 IMPLANT
PENCIL BUTTON HOLSTER BLD 10FT (ELECTRODE) ×4 IMPLANT
RELOAD EGIA 45 MED/THCK PURPLE (STAPLE) ×12 IMPLANT
RELOAD EGIA 45 TAN VASC (STAPLE) ×24 IMPLANT
RELOAD EGIA 60 MED/THCK PURPLE (STAPLE) ×8 IMPLANT
RELOAD EGIA BLACK ROTIC 45MM (STAPLE) ×8 IMPLANT
RELOAD TRI 2.0 60 XTHK VAS SUL (STAPLE) ×4 IMPLANT
SCISSORS LAP 5X35 DISP (ENDOMECHANICALS) IMPLANT
SEALANT PROGEL (MISCELLANEOUS) ×12 IMPLANT
SEALANT SURG COSEAL 4ML (VASCULAR PRODUCTS) IMPLANT
SEALANT SURG COSEAL 8ML (VASCULAR PRODUCTS) IMPLANT
SOLUTION ANTI FOG 6CC (MISCELLANEOUS) ×4 IMPLANT
SPONGE GAUZE 4X4 12PLY (GAUZE/BANDAGES/DRESSINGS) ×4 IMPLANT
SPONGE GAUZE 4X4 12PLY STER LF (GAUZE/BANDAGES/DRESSINGS) ×4 IMPLANT
SPONGE INTESTINAL PEANUT (DISPOSABLE) ×20 IMPLANT
STAPLER ROTICULATOR 4.8 (STAPLE) ×4 IMPLANT
SUT PROLENE 3 0 SH DA (SUTURE) ×20 IMPLANT
SUT PROLENE 4 0 RB 1 (SUTURE) ×6
SUT PROLENE 4-0 RB1 .5 CRCL 36 (SUTURE) ×18 IMPLANT
SUT SILK  1 MH (SUTURE) ×4
SUT SILK 1 MH (SUTURE) ×12 IMPLANT
SUT SILK 2 0 SH (SUTURE) IMPLANT
SUT SILK 2 0SH CR/8 30 (SUTURE) IMPLANT
SUT SILK 3 0SH CR/8 30 (SUTURE) ×4 IMPLANT
SUT STEEL 1 (SUTURE) IMPLANT
SUT VIC AB 1 CTX 18 (SUTURE) ×4 IMPLANT
SUT VIC AB 1 CTX 36 (SUTURE)
SUT VIC AB 1 CTX36XBRD ANBCTR (SUTURE) IMPLANT
SUT VIC AB 2-0 CTX 36 (SUTURE) ×4 IMPLANT
SUT VIC AB 2-0 UR6 27 (SUTURE) IMPLANT
SUT VIC AB 3-0 SH 8-18 (SUTURE) IMPLANT
SUT VIC AB 3-0 X1 27 (SUTURE) ×4 IMPLANT
SUT VICRYL 2 TP 1 (SUTURE) ×4 IMPLANT
SWAB COLLECTION DEVICE MRSA (MISCELLANEOUS) IMPLANT
SYR 20ML ECCENTRIC (SYRINGE) ×4 IMPLANT
SYSTEM SAHARA CHEST DRAIN ATS (WOUND CARE) ×4 IMPLANT
TAPE CLOTH SURG 4X10 WHT LF (GAUZE/BANDAGES/DRESSINGS) ×4 IMPLANT
TAPE UMBILICAL COTTON 1/8X30 (MISCELLANEOUS) ×4 IMPLANT
TIP APPLICATOR SPRAY EXTEND 16 (VASCULAR PRODUCTS) ×12 IMPLANT
TOWEL OR 17X24 6PK STRL BLUE (TOWEL DISPOSABLE) ×8 IMPLANT
TOWEL OR 17X26 10 PK STRL BLUE (TOWEL DISPOSABLE) ×8 IMPLANT
TRAP SPECIMEN MUCOUS 40CC (MISCELLANEOUS) ×4 IMPLANT
TRAY FOLEY CATH 14FRSI W/METER (CATHETERS) IMPLANT
TUBE ANAEROBIC SPECIMEN COL (MISCELLANEOUS) IMPLANT
TUBE CONNECTING 12X1/4 (SUCTIONS) ×8 IMPLANT
TUNNELER SHEATH ON-Q 11GX8 DSP (PAIN MANAGEMENT) ×4 IMPLANT
TUNNELER SHEATH ON-Q 16GX12 DP (PAIN MANAGEMENT) ×4 IMPLANT
WATER STERILE IRR 1000ML POUR (IV SOLUTION) ×8 IMPLANT

## 2013-06-04 NOTE — H&P (Signed)
[address]       [place] [address]             [phone]                       Jason Malone Medical Record #[id number] Date of Birth: [birth year]  Referring: Dr Genevive Bi, [facility] Primary Care: Golden Pop, MD  Chief Complaint:    Chief Complaint  Patient presents with  . Lung Mass    Left Lung Mass Carcinoid by needle bx    History of Present Illness:    Jason Malone 73 y.o. male  was  seen in the office   at the request of Dr. Faith Rogue because of the incidental discovery of a left hilar mass. The patient presented to [facility] in November with a sudden and new onset of atrial fibrillation. At that time a chest x-ray showed a left hilar mass. The patient denies shortness of breath recently he has coughed up a few flecks of blood. Workup included bronchoscopy by Dr. Rich Number who describes nearly obstructing mass found proximally in the apical posterior segment of the left upper lobe , the mass was described as large bloody endobronchial exophytic friable fungating an infiltrative . Endobronchial biopsies did not reveal a definitive diagnosis . Patient's had a CT scan and PET scan MRI of the brain,  pulmonary function studies . Subsequently needle biopsy of the left lung mass showed typical carcinoid.  The patient has more than a 40 year smoking history smoking one and a half packs per day.  He quit in 2000 .   The patient denies any history of myocardial infarction or anginal chest. He is unaware of any further atrial fibrillation after his admission in November. He has had increasing difficulty with blood pressure control he certainly. His dose of Cardizem has been increased. He now notes increasing facial puffiness and periorbital edema.   the patient was  referred for consideration of surgical resection, possibly require pneumonectomy.  Current Activity/ Functional Status:  Patient is independent with mobility/ambulation, transfers, ADL's,  IADL's.  Zubrod Score: At the time of surgery this patient's most appropriate activity status/level should be described as: []  Normal activity, no symptoms [x]  Symptoms, fully ambulatory []  Symptoms, in bed less than or equal to 50% of the time []  Symptoms, in bed greater than 50% of the time but less than 100% []  Bedridden []  Moribund   Past Medical History  Diagnosis Date  . Hypertension   . Hyperlipemia   . Full dentures   . Wears glasses   . Cancer   . Lung cancer   . Enlarged prostate   . A-fib   . Hypomagnesemia   . H/O hypokalemia   . Diverticulitis     Past Surgical History  Procedure Laterality Date  . Multiple tooth extractions    . Tonsillectomy    . Colonoscopy w/ polypectomy      bleed after-had to go to surgery to stop bleeding via colonoscopy  . Dupuytren contracture release  12/15/2011    Procedure: DUPUYTREN CONTRACTURE RELEASE;  Surgeon: [name], MD;  Location: [facility];  Service: Orthopedics;  Laterality: Left;  Fasciotomy left ring finger dupuytrens  . Bronchoscopy      04/06/2013  . Vasectomy      Family History  Problem Relation Age of Onset  . Stroke Mother   . Hypertension Sister   . Hyperlipidemia Sister   .  Hypertension Brother   . Hyperlipidemia Brother   . Cancer Sister     lung and colon    History   Social History  . Marital Status: Married    Spouse Name: N/A    Number of Children: N/A  . Years of Education: N/A   Occupational History  .  patient is currently retired , but remains active doing a moderate amount of physical activity including gardening . 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   Social History Main Topics  . Smoking status: Former Smoker    Quit date: 12/09/1999  . Smokeless tobacco: Never Used  . Alcohol Use: No  . Drug Use: No  . Sexual Activity: Not on file      History  Smoking status  .  Former Smoker  . Quit date: 12/09/1998  Smokeless tobacco  . Never Used    History  Alcohol Use No     Allergies  Allergen Reactions  . Latex Itching  . Tape Itching    Surgical tapes     Current Facility-Administered Medications  Medication Dose Route Frequency Provider Last Rate Last Dose  . cefUROXime (ZINACEF) 1.5 g in dextrose 5 % 50 mL IVPB  1.5 g Intravenous 60 min Pre-Op [name], MD       Facility-Administered Medications Ordered in Other Encounters  Medication Dose Route Frequency Provider Last Rate Last Dose  . lactated ringers infusion    Continuous PRN Clearnce Sorrel, CRNA          Review of Systems:     Cardiac Review of Systems: Y or N  Chest Pain [ n   ]  Resting SOB [n   ] Exertional SOB  [  n]  Orthopnea Florencio.Farrier  ]   Pedal Edema Florencio.Farrier  ]    Palpitations [ y ] Syncope  [ n ]   Presyncope [ n  ]  General Review of Systems: [Y] = yes [  ]=no Constitional: recent weight change [  ];  Wt loss over the last 3 months [ none  ] anorexia [  ]; fatigue Blue.Reese  ]; nausea [  ]; night sweats [ n ]; fever [ n ]; or chills [n  ];          Dental: poor dentition[ n ]; Last Dentist visit:   Eye : blurred vision [  ]; diplopia [   ]; vision changes [  ];  Amaurosis fugax[  ]; Resp: cough Blue.Reese  ];  wheezing[n  ];  Hemoptysis[few flecks   ]; shortness of breath[n  ]; paroxysmal nocturnal dyspnea[ n ]; dyspnea on exertion[ n ]; or orthopnea[  ];  GI:  gallstones[  ], vomiting[  ];  dysphagia[  ]; melena[  ];  hematochezia [  ]; heartburn[  ];   Hx of  Colonoscopy[y  ]; GU: kidney stones [  ]; hematuria[  ];   dysuria [  ];  nocturia[  ];  history of     obstruction [  ]; urinary frequency [ n ]             Skin: rash, swelling[  ];, hair loss[  ];  peripheral edema[y  ];  or itching[  ]; some facial edema Musculosketetal: myalgias[ n ];  joint swelling[n  ];  joint erythema[  ];  joint pain[  ];  back pain[  ];  Heme/Lymph: bruising[  n];  bleeding[ n ];  anemia[ n ];  Neuro: TIA[n   ];  headaches[n ];  stroke[n ];  vertigo[n ];  seizures[n ];   paresthesias[ n ];  difficulty walking[ n ];  Psych:depression[  ]; anxiety[  ];  Endocrine: diabetes[  ];  thyroid dysfunction[  ];  Immunizations: Flu up to date [ y ]; Pneumococcal up to date [ y ];  Other:  Physical Exam: BP 159/82  Pulse 46  Temp(Src) 98.2 F (36.8 C) (Oral)  Resp 16  SpO2 96%  PHYSICAL EXAMINATION:   General appearance: alert, cooperative, appears stated age and no distress Neurologic: intact Heart: regular rate and rhythm, S1, S2 normal, no murmur, click, rub or gallop Lungs: clear to auscultation bilaterally Abdomen: soft, non-tender; bowel sounds normal; no masses,  no organomegaly Extremities: extremities normal, atraumatic, no cyanosis or edema and Homans sign is negative, no sign of DVT No carotid bruits, no cervical supraclavicular or axillary adenopathy Full DP and PT pulses bilaterally  Diagnostic Studies & Laboratory data:   PATH: Diagnosis Lung, needle/core biopsy(ies), Left upper lobe - POSITIVE FOR WELL DIFFERENTIATED NEUROENDOCRINE TUMOR (CARCINOID). - SEE COMMENT. Microscopic Comment Immunohistochemical stains are performed. The tumor is positive for synaptophysin, chromogranin and CD56. It is negative for TTF-1. The morphology coupled with the staining pattern is consistent with the above diagnosis. Please correlate with clinical and radiologic impression for primary tumor source.The findings are called to Dr. Nestor Lewandowsky on 04/27/13. Dr. Saralyn Pilar has seen this case in consultation with agreement of the above diagnosis. (RAH:caf 04/27/13) Willeen Niece MD Pathologist, Electronic Signature (Case signed 04/27/2013)   Recent Radiology Findings:   Ct Biopsy  04/25/2013   CLINICAL DATA:  4.5 cm central left perihilar lung mass. Bronchoscopy has not been diagnostic for malignancy. The patient presents for percutaneous biopsy.  EXAM: CT GUIDED CORE BIOPSY OF LEFT LUNG   ANESTHESIA/SEDATION: 2.0  Mg IV Versed; 100 mcg IV Fentanyl  Total Moderate Sedation Time: 17 minutes.  COMPARISON:  Prior CT of the chest performed at [facility] on 03/28/2013 and PET scan on 04/17/2013.  PROCEDURE: The procedure risks, benefits, and alternatives were explained to the patient. Questions regarding the procedure were encouraged and answered. The patient understands and consents to the procedure.  The left posterior chest wall was prepped with Betadinein a sterile fashion, and a sterile drape was applied covering the operative field. A sterile gown and sterile gloves were used for the procedure. Local anesthesia was provided with 1% Lidocaine.  CT was performed in a prone position. Needle path planning was performed on a spiral acquisition. Under CT guidance, a 17 gauge needle was advanced to the level of a left perihilar lung mass from a posterior approach. After confirming needle tip position, a total of 3 separate 18 gauge core biopsy samples were obtained from the lesion. Material was submitted in formalin. Additional CT was performed. The outer needle was retracted. Additional CT images were performed.  Complications: No pneumothorax. Minimal pulmonary hemorrhage posterior to the lesion.  FINDINGS: CT again demonstrates a central left lung mass centered in the perihilar/suprahilar region of the left upper lobe. The mass measures approximately 4 cm in greatest diameter. Solid tissue was obtained from the lesion. Post biopsy imaging shows a small amount of pulmonary hemorrhage posterior to the mass. This was not associated with any hemoptysis clinically. After needle removal, CT shows no evidence of immediate pneumothorax.  IMPRESSION: CT-guided core biopsy performed successfully of the central  left perihilar upper lobe lung mass. A minimal amount of adjacent pulmonary hemorrhage was present on completion of the procedure. The patient will be observed on bedrest for 3 hr following the  procedure.   Electronically Signed   By: Aletta Edouard M.D.   On: 04/25/2013 17:21      Recent Lab Findings: Lab Results  Component Value Date   WBC 7.2 05/31/2013   HGB 14.4 05/31/2013   HCT 41.9 05/31/2013   PLT 160 05/31/2013   GLUCOSE 106* 05/31/2013   ALT 21 05/31/2013   AST 20 05/31/2013   NA 141 05/31/2013   K 4.1 05/31/2013   CL 106 05/31/2013   CREATININE 0.91 05/31/2013   BUN 18 05/31/2013   CO2 19 05/31/2013   INR 0.99 05/31/2013   CHEST 2 VIEW  COMPARISON: None.  FINDINGS: Two view exam of the chest shows vein 3.7 cm rounded central left suprahilar lesion concerning for neoplasm. There is a 6 mm nodule identified in the right mid lung. No edema or focal airspace consolidation. No evidence for pleural effusion. The cardiopericardial silhouette is within normal limits for size. Imaged bony structures of the thorax are intact.  IMPRESSION: 3.7 cm rounded density in the left suprahilar region, concerning for neoplasm. CT chest recommended with IV contrast, if possible, given the central location of this abnormality.  6 mm nodule in the right mid lung. Metastatic disease not excluded.  I personally discussed these results by telephone with Dr. Robet Leu at approximately 2046 hr on 03/28/2013  Electronically Signed By: Misty Stanley M.D. On: 03/28/2013 20:47  CT SCAN  CLINICAL DATA: Shortness of breath, evaluate for pulmonary embolism. Evaluate lung mass.  EXAM: CT ANGIOGRAPHY CHEST WITH CONTRAST  TECHNIQUE: Multidetector CT imaging of the chest was performed using the standard protocol during bolus administration of intravenous contrast. Multiplanar CT image reconstructions including MIPs were obtained to evaluate the vascular anatomy.  CONTRAST: 100 cc of Isovue 370.  COMPARISON: Chest radiograph March 28, 2013  FINDINGS: Main pulmonary artery is not enlarged. No pulmonary arterial filling defects to the level of the subsegmental branches.  4.6 x 4.1  x 4.2 cm left hilar hypoenhancing mass encases but does not efface or narrows the left pulmonary artery branches. The mass is contiguous with the lingular bronchi which appearing narrowed though, are patent. No convincing evidence of endobronchial invasion.  Right upper lobe, posterior segment 6 mm calcified granuloma, which appears to correspond to radiograph abnormality. No suspicious pulmonary nodules, pleural effusions or focal consolidations, with particular attention to the right midlung, considering recent chest radiograph. 2 mm subpleural right upper lobe nodule.  No right hilar, mediastinal nor axillary lymphadenopathy. The heart and pericardium are nonsuspicious. Irregularity of the aortic arch associated with intimal hematoma and calcified plaque consistent with atherosclerosis without hemodynamically significant stenosis.  Thoracic esophagus is unremarkable. Included view of the abdomen is nonsuspicious, no discrete adrenal mass. Mild cortical thickening of the right adrenal gland. Tiny gallstones partially imaged. Soft tissues are nonsuspicious. Nonspecific 6 mm sclerotic lesion within the right posterior 6th rib, subcentimeter sclerotic lesions within the right aspect of vertebral body L1, which preserved the overlying cortex.  Review of the MIP images confirms the above findings.  IMPRESSION: No pulmonary embolism nor acute cardiopulmonary process.  4.6 x 4.1 x 4.2 cm left hilar mass highly suspicious for primary lung neoplasm. Nodule seen on prior chest radiograph in the right hilar region corresponds to calcified granuloma. A few scattered subcentimeter sclerotic foci within the axial  skeleton fever bone islands though, considering lung mass, bone scan may be indicated.   Electronically Signed By: Elon Alas On: 03/28/2013 23:20  MRI of Brain: CLINICAL DATA: Lung cancer staging.  EXAM: MRI HEAD WITHOUT AND WITH CONTRAST  TECHNIQUE: Multiplanar,  multiecho pulse sequences of the brain and surrounding structures were obtained without and with intravenous contrast.  CONTRAST: 15 cc MultiHance  COMPARISON: None.  FINDINGS: Scattered, punctate foci of T2 hyperintensity are present in the subcortical and deep cerebral white matter bilaterally, nonspecific but compatible with minimal, age-related small vessel ischemic disease. Small region of encephalomalacia in the anterior left temporal lobe is suggestive of remote infarct. There is no evidence of acute infarct, intracranial hemorrhage, mass, midline shift, or extra-axial fluid collection. Ventricles and sulci are within normal limits for age. There is no abnormal enhancement. Orbits are unremarkable. Bilateral ethmoid air cell opacification is present. Calvarium is normal in signal.  IMPRESSION: 1. No evidence of intracranial metastatic disease. 2. 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 3. Ethmoid air cell inflammatory mucosal disease.   Electronically Signed By: Logan Bores On: 04/18/2013 12:28  PET Scan: NUCLEAR MEDICINE PET SKULL BASE TO THIGH  FASTING BLOOD GLUCOSE: Value: 86mg /dl  TECHNIQUE: 12.8 mCi F-18 FDG was injected intravenously. CT data was obtained and used for attenuation correction and anatomic localization only. (This was not acquired as a diagnostic CT examination.) Additional exam technical data entered on technologist worksheet.  COMPARISON: Chest CT 03/28/2013.  FINDINGS: NECK  There is no hypermetabolic cervical nodal activity. No suspicious activity is seen associated with the pharyngeal mucosal space. Prominent activity along the floor of the mouth is likely physiologic and related to talking.  CHEST  Left upper lobe suprahilar mass does not appear significantly changed, measuring approximately 3.7 x 3.6 cm on image 131. This mass demonstrates only low-level hypermetabolic activity with an SUV max of 3.7.  Apart from this dominant mass which narrows the left upper lobe bronchus, no hypermetabolic mediastinal or central lung masses are identified. There are no other suspicious pulmonary nodules. There is a calcified right upper lobe granuloma. Mild atherosclerosis is noted. There is no pleural or pericardial effusion.  ABDOMEN/PELVIS  There is no abnormal metabolic activity within the liver, adrenal glands, spleen or pancreas. The left kidney demonstrates cortical thinning without hydronephrosis. There is mild aortoiliac atherosclerosis. Hepatic steatosis, cholelithiasis and sigmoid colon diverticulosis are noted. The prostate gland demonstrates moderate to marked enlargement.  SKELETON  There is no hypermetabolic osseous activity to suggest metastatic disease. Lower lumbar spondylosis is noted.  IMPRESSION: 1. The central left upper lobe/suprahilar mass demonstrates only low level hypermetabolic activity. This may related to low grade neoplasm. Correlation with prior biopsy results recommended. 2. No evidence of metastatic disease. 3. Stable incidental findings including hepatic steatosis and cholelithiasis. Mild sigmoid diverticulosis and moderate to marked enlargement of the prostate gland.   Electronically Signed By: Camie Patience M.D. On: 04/17/2013 15:40  PFT: full report scanned FEV1 2.33 94% DLCO 22   119%  Assessment / Plan:    Left hilar mass, well differentiated carcinoid a needle biopsy primarily involving the upper lobe but may involve the main pulmonary artery on CT scan. I discussed surgical resection with the patient including the possibility of pneumonectomy. Patient has been seen by  Dr. Rockey Situ, cardiology both for cardiology clearance and for better blood pressure control. A myoview stress test was done "low risk" . Full set of pulmonary function studies have been done and scanned  into Epic. The patient was  presented at the [facility] conference last week and all  were in agreement to proceed with resection. I have discussed the recommendation to proceed with resection, possibly including left pneumonectomy. Risks and options  discussed in detail.   The goals risks and alternatives of the planned surgical procedure Bronchoscopy, left VATS, lung resection, Poss Pneumonectomy   have been discussed with the patient in detail. The risks of the procedure including death, infection, stroke, myocardial infarction, bleeding, blood transfusion, bronchial stump leak, prolonged air leak have all been discussed specifically.  I have quoted Kiyoto Horlacher a 4% of perioperative mortality and a complication rate as high as 25 %. The patient's questions have been answered.Pepper Anna is willing  to proceed with the planned procedure.  [name] MD      [address] [place] [address] Office [phone]   Beeper [phone]

## 2013-06-04 NOTE — Anesthesia Postprocedure Evaluation (Signed)
  Anesthesia Post-op Note  Patient: Jason Malone  Procedure(s) Performed: Procedure(s): VIDEO BRONCHOSCOPY (N/A) VIDEO ASSISTED THORACOSCOPY (VATS)/WEDGE RESECTION (Left)  Patient Location: PACU  Anesthesia Type:General  Level of Consciousness: awake, alert  and oriented  Airway and Oxygen Therapy: Patient Spontanous Breathing and Patient connected to nasal cannula oxygen  Post-op Pain: mild  Post-op Assessment: Post-op Vital signs reviewed, Patient's Cardiovascular Status Stable, Respiratory Function Stable, Patent Airway, No signs of Nausea or vomiting and Pain level controlled  Post-op Vital Signs: stable  Complications: No apparent anesthesia complications

## 2013-06-04 NOTE — Transfer of Care (Signed)
Immediate Anesthesia Transfer of Care Note  Patient: Jason Malone  Procedure(s) Performed: Procedure(s): VIDEO BRONCHOSCOPY (N/A) VIDEO ASSISTED THORACOSCOPY (VATS)/WEDGE RESECTION (Left)  Patient Location: PACU  Anesthesia Type:General  Level of Consciousness: awake and alert   Airway & Oxygen Therapy: Patient Spontanous Breathing and Patient connected to face mask oxygen  Post-op Assessment: Report given to PACU RN and Post -op Vital signs reviewed and stable  Post vital signs: Reviewed and stable  Complications: No apparent anesthesia complications

## 2013-06-04 NOTE — Brief Op Note (Addendum)
      [address]       [place] [address]             [phone]        3:17 PM  PATIENT:  Jason Malone  73 y.o. male  PRE-OPERATIVE DIAGNOSIS:  Left  Hilar mass  POST-OPERATIVE DIAGNOSIS:  Same carcinoid  PROCEDURE:  Procedure(s):  VIDEO BRONCHOSCOPY   MINI THORACOTOMY -Left upper lobectomy - Lymph Node Sampling ( Lymph node 8,10,11, Pleural Biopsy) -Insertion of On-Q  SURGEON:  Surgeon(s) and Role:    * [name], MD - Primary  PHYSICIAN ASSISTANT: Erin Barrett PA-C  ANESTHESIA:   general  EBL:  Total I/O In: 1000 [I.V.:1000] Out: 4103 [Urine:635; Blood:500]  BLOOD ADMINISTERED:none  DRAINS: Left chest tube x 2   LOCAL MEDICATIONS USED:  MARCAINE     SPECIMEN:  Source of Specimen:  Left upper lobe, and additional bronchial margin. Final Margins all negative f  DISPOSITION OF SPECIMEN:  PATHOLOGY  COUNTS:  YES   DICTATION: .Dragon Dictation  PLAN OF CARE: Admit to inpatient   PATIENT DISPOSITION:  ICU - intubated and hemodynamically stable.   Delay start of Pharmacological VTE agent (>24hrs) due to surgical blood loss or risk of bleeding: yes

## 2013-06-04 NOTE — Progress Notes (Signed)
Patient ID: Jason Malone, male   DOB: 05-13-1940, 73 y.o.   MRN: [id number] EVENING ROUNDS NOTE :     [address]       [place] [address]             [phone]                 Day of Surgery Procedure(s) (LRB): VIDEO BRONCHOSCOPY (N/A) VIDEO ASSISTED THORACOSCOPY (VATS)/WEDGE RESECTION (Left)  Total Length of Stay:  LOS: 0 days  BP 171/98  Pulse 62  Temp(Src) 98.5 F (36.9 C) (Oral)  Resp 15  Ht 5\' 6"  (1.676 m)  Wt 175 lb (79.379 kg)  BMI 28.26 kg/m2  SpO2 97%  .Intake/Output     01/25 0701 - 01/26 0700 01/26 0701 - 01/27 0700   I.V. (mL/kg)  2150 (27.1)   Total Intake(mL/kg)  2150 (27.1)   Urine (mL/kg/hr)  710 (0.8)   Blood  500 (0.6)   Chest Tube  50 (0.1)   Total Output   1260   Net   +890          . bupivacaine ON-Q pain pump    . dextrose 5 % and 0.45 % NaCl with KCl 10 mEq/L       Lab Results  Component Value Date   WBC 7.2 05/31/2013   HGB 14.4 05/31/2013   HCT 41.9 05/31/2013   PLT 160 05/31/2013   GLUCOSE 106* 05/31/2013   ALT 21 05/31/2013   AST 20 05/31/2013   NA 141 05/31/2013   K 4.1 05/31/2013   CL 106 05/31/2013   CREATININE 0.91 05/31/2013   BUN 18 05/31/2013   CO2 19 05/31/2013   INR 0.99 05/31/2013   Extubated, now in icu Air leak with cough only Chest xray satble Not bleeding  [name] MD  Beeper [phone] Office [phone] 06/04/2013 6:27 PM

## 2013-06-04 NOTE — Anesthesia Preprocedure Evaluation (Addendum)
Anesthesia Evaluation  Patient identified by MRN, date of birth, ID band Patient awake    Reviewed: Allergy & Precautions, H&P , NPO status , Patient's Chart, lab work & pertinent test results, reviewed documented beta blocker date and time   Airway Mallampati: II TM Distance: >3 FB Neck ROM: Full    Dental  (+) Edentulous Upper and Edentulous Lower   Pulmonary former smoker,  breath sounds clear to auscultation        Cardiovascular hypertension, Rhythm:Regular Rate:Normal     Neuro/Psych    GI/Hepatic   Endo/Other    Renal/GU      Musculoskeletal   Abdominal   Peds  Hematology   Anesthesia Other Findings   Reproductive/Obstetrics                          Anesthesia Physical Anesthesia Plan  ASA: III  Anesthesia Plan: General   Post-op Pain Management:    Induction: Intravenous  Airway Management Planned: Oral ETT and Double Lumen EBT  Additional Equipment: Arterial line and CVP  Intra-op Plan:   Post-operative Plan: Extubation in OR  Informed Consent: I have reviewed the patients History and Physical, chart, labs and discussed the procedure including the risks, benefits and alternatives for the proposed anesthesia with the patient or authorized representative who has indicated his/her understanding and acceptance.     Plan Discussed with: CRNA, Anesthesiologist and Surgeon  Anesthesia Plan Comments:         Anesthesia Quick Evaluation

## 2013-06-05 ENCOUNTER — Inpatient Hospital Stay (HOSPITAL_COMMUNITY): Payer: Medicare Other

## 2013-06-05 ENCOUNTER — Encounter (HOSPITAL_COMMUNITY): Payer: Self-pay | Admitting: Cardiothoracic Surgery

## 2013-06-05 LAB — CBC
HCT: 37.6 % — ABNORMAL LOW (ref 39.0–52.0)
HEMOGLOBIN: 12.5 g/dL — AB (ref 13.0–17.0)
MCH: 30.2 pg (ref 26.0–34.0)
MCHC: 33.2 g/dL (ref 30.0–36.0)
MCV: 90.8 fL (ref 78.0–100.0)
PLATELETS: 147 10*3/uL — AB (ref 150–400)
RBC: 4.14 MIL/uL — ABNORMAL LOW (ref 4.22–5.81)
RDW: 14.8 % (ref 11.5–15.5)
WBC: 14 10*3/uL — AB (ref 4.0–10.5)

## 2013-06-05 LAB — POCT I-STAT 3, ART BLOOD GAS (G3+)
Acid-Base Excess: 2 mmol/L (ref 0.0–2.0)
Bicarbonate: 26.7 mEq/L — ABNORMAL HIGH (ref 20.0–24.0)
O2 Saturation: 97 %
Patient temperature: 98.9
TCO2: 28 mmol/L (ref 0–100)
pCO2 arterial: 41.9 mmHg (ref 35.0–45.0)
pH, Arterial: 7.412 (ref 7.350–7.450)
pO2, Arterial: 86 mmHg (ref 80.0–100.0)

## 2013-06-05 LAB — BASIC METABOLIC PANEL
BUN: 11 mg/dL (ref 6–23)
CO2: 24 meq/L (ref 19–32)
Calcium: 8.3 mg/dL — ABNORMAL LOW (ref 8.4–10.5)
Chloride: 103 mEq/L (ref 96–112)
Creatinine, Ser: 1 mg/dL (ref 0.50–1.35)
GFR calc non Af Amer: 74 mL/min — ABNORMAL LOW (ref >90)
GFR, EST AFRICAN AMERICAN: 86 mL/min — AB (ref >90)
Glucose, Bld: 143 mg/dL — ABNORMAL HIGH (ref 70–99)
POTASSIUM: 3.9 meq/L (ref 3.7–5.3)
Sodium: 138 mEq/L (ref 137–147)

## 2013-06-05 MED ORDER — LEVALBUTEROL HCL 0.63 MG/3ML IN NEBU
0.6300 mg | INHALATION_SOLUTION | Freq: Two times a day (BID) | RESPIRATORY_TRACT | Status: DC
  Administered 2013-06-06 – 2013-06-09 (×7): 0.63 mg via RESPIRATORY_TRACT
  Filled 2013-06-05 (×14): qty 3

## 2013-06-05 MED ORDER — POTASSIUM CHLORIDE CRYS ER 20 MEQ PO TBCR
20.0000 meq | EXTENDED_RELEASE_TABLET | Freq: Once | ORAL | Status: AC
  Administered 2013-06-05: 20 meq via ORAL
  Filled 2013-06-05: qty 1

## 2013-06-05 MED ORDER — CARVEDILOL 6.25 MG PO TABS
6.2500 mg | ORAL_TABLET | Freq: Two times a day (BID) | ORAL | Status: DC
  Administered 2013-06-05 – 2013-06-10 (×11): 6.25 mg via ORAL
  Filled 2013-06-05 (×12): qty 1

## 2013-06-05 MED ORDER — IPRATROPIUM BROMIDE 0.02 % IN SOLN
0.5000 mg | Freq: Two times a day (BID) | RESPIRATORY_TRACT | Status: DC
  Administered 2013-06-06 – 2013-06-09 (×7): 0.5 mg via RESPIRATORY_TRACT
  Filled 2013-06-05 (×7): qty 2.5

## 2013-06-05 MED ORDER — LEVALBUTEROL HCL 0.63 MG/3ML IN NEBU
0.6300 mg | INHALATION_SOLUTION | Freq: Four times a day (QID) | RESPIRATORY_TRACT | Status: DC | PRN
  Administered 2013-06-06 – 2013-06-07 (×2): 0.63 mg via RESPIRATORY_TRACT
  Filled 2013-06-05 (×2): qty 3

## 2013-06-05 MED ORDER — LISINOPRIL 2.5 MG PO TABS
2.5000 mg | ORAL_TABLET | Freq: Every day | ORAL | Status: DC
  Administered 2013-06-06 – 2013-06-09 (×4): 2.5 mg via ORAL
  Filled 2013-06-05 (×4): qty 1

## 2013-06-05 MED ORDER — KCL IN DEXTROSE-NACL 10-5-0.45 MEQ/L-%-% IV SOLN
INTRAVENOUS | Status: DC
Start: 2013-06-05 — End: 2013-06-10
  Administered 2013-06-05 – 2013-06-06 (×3): via INTRAVENOUS
  Administered 2013-06-07: 1 mL via INTRAVENOUS
  Administered 2013-06-08 – 2013-06-09 (×2): via INTRAVENOUS
  Filled 2013-06-05 (×8): qty 1000

## 2013-06-05 NOTE — Progress Notes (Addendum)
[facility] DAILY ICU PROGRESS NOTE                   [address]            [place] [address]          [phone]   1 Day Post-Op Procedure(s) (LRB): VIDEO BRONCHOSCOPY (N/A) VIDEO ASSISTED THORACOSCOPY (VATS)/WEDGE RESECTION (Left)  Total Length of Stay:  LOS: 1 day   Subjective: Patient awake and alert. Incisional pain fairly well controlled.  Objective: Vital signs in last 24 hours: Temp:  [98 F (36.7 C)-98.9 F (37.2 C)] 98 F (36.7 C) (01/27 0750) Pulse Rate:  [56-67] 56 (01/27 0700) Cardiac Rhythm:  [-] Normal sinus rhythm (01/27 0700) Resp:  [12-21] 12 (01/27 0700) BP: (100-171)/(48-98) 100/48 mmHg (01/27 0700) SpO2:  [95 %-100 %] 96 % (01/27 0804) Arterial Line BP: (75-211)/(59-103) 89/63 mmHg (01/27 0400) FiO2 (%):  [21 %] 21 % (01/26 1926) Weight:  [174 lb 2.6 oz (79 kg)-175 lb (79.379 kg)] 174 lb 2.6 oz (79 kg) (01/27 0500)  Filed Weights   06/04/13 1438 06/05/13 0500  Weight: 175 lb (79.379 kg) 174 lb 2.6 oz (79 kg)      Intake/Output from previous day: 01/26 0701 - 01/27 0700 In: 3425.5 [I.V.:3375.5; IV Piggyback:50] Out: 2095 [Urine:1135; Blood:500; Chest Tube:460]  Intake/Output this shift:    Current Meds: Scheduled Meds: . acetaminophen  1,000 mg Oral Q6H   Or  . acetaminophen (TYLENOL) oral liquid 160 mg/5 mL  1,000 mg Oral Q6H  . bisacodyl  10 mg Oral Daily  . carvedilol  6.25 mg Oral BID  . cefUROXime (ZINACEF)  IV  1.5 g Intravenous Q12H  . fentaNYL   Intravenous Q4H  . levalbuterol  0.63 mg Nebulization Q6H  . [START ON 06/06/2013] lisinopril  2.5 mg Oral Daily  . metoCLOPramide  10 mg Oral Q6H  . polycarbophil  625 mg Oral Daily  . potassium chloride  20 mEq Oral Once  . simvastatin  40 mg Oral q morning - 10a   Continuous Infusions: . bupivacaine ON-Q pain pump    . dextrose 5 % and 0.45 % NaCl with KCl 10 mEq/L     PRN Meds:.diphenhydrAMINE, diphenhydrAMINE, naloxone, ondansetron (ZOFRAN) IV,  oxyCODONE-acetaminophen, potassium chloride, sodium chloride  General appearance: alert, cooperative and no distress Neurologic: intact Heart: RRR Lungs: Coarse breath sounds L>R Abdomen: Soft, non tender, bowel sounds present Extremities: SCDs in place Wound: Dressing is clean and dry  Lab Results: CBC:  Recent Labs  06/05/13 0430  WBC 14.0*  HGB 12.5*  HCT 37.6*  PLT 147*   BMET:   Recent Labs  06/05/13 0430  NA 138  K 3.9  CL 103  CO2 24  GLUCOSE 143*  BUN 11  CREATININE 1.00  CALCIUM 8.3*    PT/INR: No results found for this basename: LABPROT, INR,  in the last 72 hours Radiology: Dg Chest 2 View  Dg Chest Port 1 View  06/05/2013   CLINICAL DATA:  Intubation.  EXAM: PORTABLE CHEST - 1 VIEW  COMPARISON:  Chest x-ray 06/04/2013.  Chest CT 03/28/2013 .  FINDINGS: Two left chest tubes are present. No pneumothorax. Central line on the right is noted. This is in stable position. Atelectasis versus infiltrate left lower lobe is present. Small left pleural effusion. Stable cardiomegaly. Surgical clips left hilar region . Left posterior lateral fifth rib fracture is present. This is slightly displaced. This could represent a pathologic fracture.  IMPRESSION: 1. 2 left chest tubes are noted in stable position. No pneumothorax. Central line in good anatomic position. 2. Developing atelectasis and infiltrate left lower lobe. 3. Small left pleural effusion. 4. Left posterior lateral fifth rib fracture. This could be pathologic. 5. Stable cardiomegaly.   Electronically Signed   By: Marcello Moores  Register   On: 06/05/2013 08:07      Assessment/Plan: S/P Procedure(s) (LRB): VIDEO BRONCHOSCOPY (N/A) VIDEO ASSISTED THORACOSCOPY (VATS)/WEDGE RESECTION (Left)  1.CV-On Coreg 12.5 bid and Lisinopril 10 daily. SB this am. Will decrease Coreg to 6.25 bid and possibly start low dose Lisinopril in am 2.Pulmonary-Chest tubes with 460 cc of output since surgery. There is an air leak with cough .  Chest tubes to remain to water seal for now.CXR appears to show no pneumothorax, bibasilar atelectasis. Encourage incentive spirometer 3.Mild ABL anemia-H and H 12.5 and 37.6 4.Supplement potassium 5. Remove foley  D Ezekiel Slocumb 06/05/2013 8:18 AM  Continue ct for now on water seal Transfer to step down I have seen and examined Trelon Flock and agree with the above assessment  and plan.  [name] MD Beeper [phone] Office [phone] 06/05/2013 8:19 AM

## 2013-06-06 ENCOUNTER — Inpatient Hospital Stay (HOSPITAL_COMMUNITY): Payer: Medicare Other

## 2013-06-06 LAB — CBC
HEMATOCRIT: 39.3 % (ref 39.0–52.0)
HEMOGLOBIN: 13.3 g/dL (ref 13.0–17.0)
MCH: 30.9 pg (ref 26.0–34.0)
MCHC: 33.8 g/dL (ref 30.0–36.0)
MCV: 91.4 fL (ref 78.0–100.0)
Platelets: 140 10*3/uL — ABNORMAL LOW (ref 150–400)
RBC: 4.3 MIL/uL (ref 4.22–5.81)
RDW: 14.7 % (ref 11.5–15.5)
WBC: 18.7 10*3/uL — ABNORMAL HIGH (ref 4.0–10.5)

## 2013-06-06 LAB — COMPREHENSIVE METABOLIC PANEL
ALT: 18 U/L (ref 0–53)
AST: 29 U/L (ref 0–37)
Albumin: 3 g/dL — ABNORMAL LOW (ref 3.5–5.2)
Alkaline Phosphatase: 75 U/L (ref 39–117)
BUN: 10 mg/dL (ref 6–23)
CALCIUM: 8.8 mg/dL (ref 8.4–10.5)
CO2: 23 mEq/L (ref 19–32)
Chloride: 105 mEq/L (ref 96–112)
Creatinine, Ser: 1.01 mg/dL (ref 0.50–1.35)
GFR, EST AFRICAN AMERICAN: 85 mL/min — AB (ref >90)
GFR, EST NON AFRICAN AMERICAN: 73 mL/min — AB (ref >90)
GLUCOSE: 136 mg/dL — AB (ref 70–99)
Potassium: 4.1 mEq/L (ref 3.7–5.3)
SODIUM: 143 meq/L (ref 137–147)
Total Bilirubin: 0.5 mg/dL (ref 0.3–1.2)
Total Protein: 6.5 g/dL (ref 6.0–8.3)

## 2013-06-06 MED ORDER — BISACODYL 10 MG RE SUPP: 10.0000 mg | Freq: Once | RECTAL | Status: DC

## 2013-06-06 MED ORDER — METOPROLOL TARTRATE 1 MG/ML IV SOLN
INTRAVENOUS | Status: AC
  Filled 2013-06-06: qty 5

## 2013-06-06 MED ORDER — METOPROLOL TARTRATE 1 MG/ML IV SOLN
5.0000 mg | Freq: Once | INTRAVENOUS | Status: AC
  Administered 2013-06-06: 5 mg via INTRAVENOUS

## 2013-06-06 MED ORDER — SIMETHICONE 40 MG/0.6ML PO SUSP
20.0000 mg | Freq: Four times a day (QID) | ORAL | Status: DC | PRN
  Filled 2013-06-06 (×2): qty 0.6

## 2013-06-06 MED ORDER — AMIODARONE HCL IN DEXTROSE 360-4.14 MG/200ML-% IV SOLN
30.0000 mg/h | INTRAVENOUS | Status: DC
  Filled 2013-06-06 (×9): qty 200

## 2013-06-06 MED ORDER — TAMSULOSIN HCL 0.4 MG PO CAPS
0.4000 mg | ORAL_CAPSULE | Freq: Every day | ORAL | Status: DC
  Administered 2013-06-06 – 2013-06-10 (×5): 0.4 mg via ORAL
  Filled 2013-06-06 (×5): qty 1

## 2013-06-06 MED ORDER — ENOXAPARIN SODIUM 30 MG/0.3ML ~~LOC~~ SOLN
30.0000 mg | SUBCUTANEOUS | Status: DC
  Administered 2013-06-06 – 2013-06-10 (×5): 30 mg via SUBCUTANEOUS
  Filled 2013-06-06 (×5): qty 0.3

## 2013-06-06 MED ORDER — AMIODARONE HCL IN DEXTROSE 360-4.14 MG/200ML-% IV SOLN
60.0000 mg/h | INTRAVENOUS | Status: AC
  Administered 2013-06-07 (×2): 60 mg/h via INTRAVENOUS
  Filled 2013-06-06 (×2): qty 200

## 2013-06-06 NOTE — Progress Notes (Signed)
Patient ID: Jason Malone, male   DOB: 1941/01/21, 73 y.o.   MRN: [id number] [facility] DAILY ICU PROGRESS NOTE                   [address]            [place] [address]          [phone]   2 Days Post-Op Procedure(s) (LRB): VIDEO BRONCHOSCOPY (N/A) VIDEO ASSISTED THORACOSCOPY (VATS)/WEDGE RESECTION (Left)  Total Length of Stay:  LOS: 2 days   Subjective: Up during night with urinary retention, foley replaced and now draining  Objective: Vital signs in last 24 hours: Temp:  [97.4 F (36.3 C)-99.1 F (37.3 C)] 98.1 F (36.7 C) (01/28 0704) Pulse Rate:  [57-73] 70 (01/28 0325) Cardiac Rhythm:  [-] Normal sinus rhythm (01/28 0800) Resp:  [13-20] 20 (01/28 0758) BP: (119-175)/(58-82) 152/74 mmHg (01/28 0800) SpO2:  [91 %-97 %] 97 % (01/28 0758)  Filed Weights   06/04/13 1438 06/05/13 0500  Weight: 175 lb (79.379 kg) 174 lb 2.6 oz (79 kg)    Weight change:    Hemodynamic parameters for last 24 hours:    Intake/Output from previous day: 01/27 0701 - 01/28 0700 In: 6270 [P.O.:320; I.V.:1152] Out: 2715 [Urine:2265; Chest Tube:450]  Intake/Output this shift: Total I/O In: 73 [I.V.:73] Out: -   Current Meds: Scheduled Meds: . bisacodyl  10 mg Oral Daily  . carvedilol  6.25 mg Oral BID  . fentaNYL   Intravenous Q4H  . ipratropium  0.5 mg Nebulization BID  . levalbuterol  0.63 mg Nebulization BID  . lisinopril  2.5 mg Oral Daily  . polycarbophil  625 mg Oral Daily  . simvastatin  40 mg Oral q morning - 10a   Continuous Infusions: . bupivacaine ON-Q pain pump    . dextrose 5 % and 0.45 % NaCl with KCl 10 mEq/L 50 mL/hr at 06/06/13 0800   PRN Meds:.diphenhydrAMINE, diphenhydrAMINE, levalbuterol, naloxone, ondansetron (ZOFRAN) IV, oxyCODONE-acetaminophen, potassium chloride, sodium chloride  General appearance: alert, cooperative and no distress Neurologic: intact Heart: regular rate and rhythm, S1, S2 normal, no murmur, click, rub or gallop Lungs:  diminished breath sounds LLL Abdomen: soft, non-tender; bowel sounds normal; no masses,  no organomegaly Extremities: extremities normal, atraumatic, no cyanosis or edema and Homans sign is negative, no sign of DVT Wound: dressing intact No air leak today  Lab Results: CBC: Recent Labs  06/05/13 0430 06/06/13 0455  WBC 14.0* 18.7*  HGB 12.5* 13.3  HCT 37.6* 39.3  PLT 147* 140*   BMET:  Recent Labs  06/05/13 0430 06/06/13 0455  NA 138 143  K 3.9 4.1  CL 103 105  CO2 24 23  GLUCOSE 143* 136*  BUN 11 10  CREATININE 1.00 1.01  CALCIUM 8.3* 8.8    PT/INR: No results found for this basename: LABPROT, INR,  in the last 72 hours Radiology: Dg Chest Port 1 View  06/06/2013   CLINICAL DATA:  Followup left-sided chest tubes.  EXAM: PORTABLE CHEST - 1 VIEW  COMPARISON:  06/05/2013  FINDINGS: No definite change in the position of the chest tubes. This is allowing for the reversed lordotic positioning and low lung volumes.  Mild atelectasis is noted at the left base and along the left lateral lower hemi thorax. No lung consolidation or edema.  Cardiac silhouette is normal in size.  Right subclavian central venous line is stable.  No pneumothorax.  IMPRESSION: 1. No pneumothorax. Stable left-sided chest  tubes and right subclavian central venous line. 2. Left basilar opacity appears less prominent on the current study, which is likely due to differences in patient positioning. This is felt to be atelectasis. No convincing infiltrate. No pulmonary edema.   Electronically Signed   By: Lajean Manes M.D.   On: 06/06/2013 08:25   Dg Chest Port 1 View  06/05/2013   CLINICAL DATA:  Intubation.  EXAM: PORTABLE CHEST - 1 VIEW  COMPARISON:  Chest x-ray 06/04/2013.  Chest CT 03/28/2013 .  FINDINGS: Two left chest tubes are present. No pneumothorax. Central line on the right is noted. This is in stable position. Atelectasis versus infiltrate left lower lobe is present. Small left pleural effusion. Stable  cardiomegaly. Surgical clips left hilar region . Left posterior lateral fifth rib fracture is present. This is slightly displaced. This could represent a pathologic fracture.  IMPRESSION: 1. 2 left chest tubes are noted in stable position. No pneumothorax. Central line in good anatomic position. 2. Developing atelectasis and infiltrate left lower lobe. 3. Small left pleural effusion. 4. Left posterior lateral fifth rib fracture. This could be pathologic. 5. Stable cardiomegaly.   Electronically Signed   By: Marcello Moores  Register   On: 06/05/2013 08:07   Dg Chest Portable 1 View  06/04/2013   CLINICAL DATA:  Status post left upper lobectomy and resection of perihilar carcinoid tumor.  EXAM: PORTABLE CHEST - 1 VIEW  COMPARISON:  Film earlier this morning.  FINDINGS: Two left-sided chest tubes are in place postop. No pneumothorax is identified. Central line has been placed via the right subclavian vein with the tip in the lower SVC. Lungs show minimal scattered atelectasis. No significant airspace consolidation, edema or pleural fluid is identified. The heart size and mediastinal contours are within normal limits.  IMPRESSION: No pneumothorax identified postoperatively. Central line tip in lower SVC.   Electronically Signed   By: Aletta Edouard M.D.   On: 06/04/2013 16:27     Assessment/Plan: S/P Procedure(s) (LRB): VIDEO BRONCHOSCOPY (N/A) VIDEO ASSISTED THORACOSCOPY (VATS)/WEDGE RESECTION (Left) Mobilize Continue foley due to bladder outlet obstruction Leave ct tube today 450 ml drainage, likely remove one tomorrow Mild wbc elevation but no obvious infection, check again Holding sinus, has history of a fib in past   Kelly Eisler B 06/06/2013 9:23 AM

## 2013-06-06 NOTE — Op Note (Signed)
NAMEJARL, Jason Malone                 ACCOUNT NO.:  [id number]  MEDICAL RECORD NO.:  [id number]  LOCATION:  3S05C                        FACILITY:  [facility]  PHYSICIAN:  Lanelle Bal, MD    DATE OF BIRTH:  1941/01/10  DATE OF PROCEDURE:  06/04/2013 DATE OF DISCHARGE:                              OPERATIVE REPORT   PREOPERATIVE DIAGNOSIS:  Large left hilar carcinoid tumor.  POSTOPERATIVE DIAGNOSIS:  Large left hilar carcinoid tumor.  SURGICAL PROCEDURE:  Bronchoscopy, left video-assisted thoracoscopy, minithoracotomy, left upper lobectomy with lymph node dissection, re- resection of the bronchial stump, and placement of On-Q device. Mediastinal lymph node dissection.  SURGEON:  Lanelle Bal, MD  FIRST ASSISTANT:  Providence Crosby, PA  BRIEF HISTORY:  The patient is a 73 year old male who came to attention of Thoracic Surgery when he presented to [facility] in early November with episode of atrial fibrillation.  Chest x-ray showed a left hilar mass.  Further evaluation including CT scan, bronchoscopy, CT- guided needle biopsy, and PET scan were all performed at [facility].  Ultimately, a needle biopsy CT-guided confirmed carcinoid tumor.  The patient was referred for consideration of surgical resection including possible pneumonectomy because of the large size and location of the mass.  Risks and options were discussed with the patient in detail and he was willing to proceed.  Pulmonary function studies were adequate for resection.  DESCRIPTION OF PROCEDURE:  With central line and arterial line in place, the patient underwent general endotracheal anesthesia without incident. A single-lumen endotracheal tube was passed and through this, a fiberoptic bronchoscope was passed to the subsegmental level.  The right tracheobronchial tree was without lesions.  The left lower lobe was clear of any lesions and the examination of the left upper lobe in the posterior segment  totally occluding the orifice was a smooth vascular appearing mass.  The scope was removed.  Then, a right-sided double- lumen endotracheal tube was placed.  The patient was turned in lateral decubitus position.  The left side was prepped.  Appropriate time-out with observation of preoperative laterality markings were noted and a small port incision was made in the left side.  Through this, a fiberoptic bronchoscope was passed.  The left lung had been deflated. Looking with the scope, it was obvious that there was a large central mass that bulge along the major fissure.  With the possible involvement of the pulmonary tumor on the pulmonary artery, we decided to enlarge the incision with a minithoracotomy and through this, 2 port sites proceeded with resection.  Tissue around the upper hilum was freed up. We proceeded anteriorly with the division of the left upper lobe, pulmonary vein with a vascular stapler.  This allowed [place] to dissect along the pulmonary artery, and the highest pulmonary artery branch to the upper lobe was divided with a vascular stapler.  It was apparent that the mass was along the main pulmonary artery branch as we approached this posteriorly.  We then identified the left upper lobe bronchus which was encircled and then divided with a black stapler. Before division, the lower lobe was inflated without difficulty, and Dr. Linna Caprice passed the small bronchoscope down the left  side, confirming the closure of the left upper lobe, bronchial stump was then divided.  This allowed access to the major bulk of the tumor as it was surrounding the pulmonary artery.  We proceeded with tedious dissection and identified for pulmonary artery branches to the left upper lobe.  Each was divided with vascular stapler.  It did not appear to be direct tumor invasion of the pulmonary artery but the tumor was wrapping around the main pulmonary artery.  With the pulmonary artery branches divided,  we then proceeded to develop the fissures with the lower lobe.  The lingular pulmonary artery branches were divided and the fissure was completed with firing of purple staplers.  A portion of the superior segment of the lower lobe was included with this dissection to ensure adequate margin along the fissure.  The tumor was then removed.  Pathologic examination of the bronchial stump was microscopically positive for tumor.  We had divided the upper lobe bronchus very closely with the stapler.  The remaining margins of the longer fissure were clear of tumor, so we proceeded with re-resection of the bronchial stump.  There was not enough stump to use a stapler, so the bronchial stump was excised with an additional margin and submitted to Pathology.  The new margin was clear of tumor.  We then proceeded to close the bronchial stump manually with a series of interrupted 4-0 Prolene sutures.  The bronchial stump was tested by inflating the lower lobe without evidence of air leak.  There was some air leak along the upper portion of the lower lobe where the fissure had been divided.  ProGEL was placed here. A pedicle of the mediastinal fat was used to intact over top of the bronchial stump between the bronchial stump and the pulmonary artery. After the lung was removed, we then proceeded with dissection of the mediastinal lymph nodes, one area in the superior mediastinal fat. There was somewhat thickened.  It was biopsied and submitted as pleural biopsy.  In addition, biopsies 10 L, 11 L, and 8 L lymph nodes were submitted separately to Pathology. An On-Q device was tunneled subpleurally through the 2 port sites that had been used.  The 2 chest tubes were placed, 1 anteriorly and 1 posteriorly.  The ribs were reapproximated with 2 pericostal sutures, with the lower rib being drilled.  The muscle layers were closed with interrupted 0 Vicryl.  Subcutaneous tissue was closed with running  2-0 Vicryl, and a 3-0 subcuticular stitch in the skin edges.  Dry dressings were applied.  The patient was then awakened and extubated in the operating room.  Sponge and needle count was reported as correct at the completion of procedure.  The patient tolerated the procedure without obvious complication.  Estimated blood loss was approximately 150-200 mL.  The patient did not require any blood bank blood products during the operative procedure.   Lanelle Bal, MD     EG/MEDQ  D:  06/05/2013  T:  06/06/2013  Job:  [id number]

## 2013-06-07 ENCOUNTER — Inpatient Hospital Stay (HOSPITAL_COMMUNITY): Payer: Medicare Other

## 2013-06-07 LAB — TYPE AND SCREEN
ABO/RH(D): A POS
Antibody Screen: NEGATIVE
Unit division: 0
Unit division: 0

## 2013-06-07 LAB — BASIC METABOLIC PANEL
BUN: 11 mg/dL (ref 6–23)
CO2: 27 mEq/L (ref 19–32)
Calcium: 8.4 mg/dL (ref 8.4–10.5)
Chloride: 102 mEq/L (ref 96–112)
Creatinine, Ser: 0.98 mg/dL (ref 0.50–1.35)
GFR calc Af Amer: >90 mL/min (ref >90)
GFR calc non Af Amer: 81 mL/min — ABNORMAL LOW (ref >90)
Glucose, Bld: 150 mg/dL — ABNORMAL HIGH (ref 70–99)
Potassium: 3.7 mEq/L (ref 3.7–5.3)
Sodium: 139 mEq/L (ref 137–147)

## 2013-06-07 LAB — CBC
HCT: 37.4 % — ABNORMAL LOW (ref 39.0–52.0)
Hemoglobin: 12.7 g/dL — ABNORMAL LOW (ref 13.0–17.0)
MCH: 30.8 pg (ref 26.0–34.0)
MCHC: 34 g/dL (ref 30.0–36.0)
MCV: 90.8 fL (ref 78.0–100.0)
Platelets: 142 10*3/uL — ABNORMAL LOW (ref 150–400)
RBC: 4.12 MIL/uL — ABNORMAL LOW (ref 4.22–5.81)
RDW: 14.6 % (ref 11.5–15.5)
WBC: 17.9 10*3/uL — ABNORMAL HIGH (ref 4.0–10.5)

## 2013-06-07 MED ORDER — POTASSIUM CHLORIDE CRYS ER 20 MEQ PO TBCR
20.0000 meq | EXTENDED_RELEASE_TABLET | Freq: Two times a day (BID) | ORAL | Status: AC
  Administered 2013-06-07 (×2): 20 meq via ORAL
  Filled 2013-06-07 (×2): qty 1

## 2013-06-07 MED ORDER — AMIODARONE HCL 200 MG PO TABS
400.0000 mg | ORAL_TABLET | Freq: Two times a day (BID) | ORAL | Status: DC
  Administered 2013-06-07 – 2013-06-10 (×7): 400 mg via ORAL
  Filled 2013-06-07 (×8): qty 2

## 2013-06-07 MED ORDER — SIMETHICONE 40 MG/0.6ML PO SUSP
40.0000 mg | Freq: Four times a day (QID) | ORAL | Status: DC | PRN
  Administered 2013-06-09: 40 mg via ORAL
  Filled 2013-06-07 (×2): qty 0.6

## 2013-06-07 MED ORDER — SIMVASTATIN 20 MG PO TABS
20.0000 mg | ORAL_TABLET | Freq: Every morning | ORAL | Status: DC
  Administered 2013-06-07 – 2013-06-10 (×4): 20 mg via ORAL
  Filled 2013-06-07 (×4): qty 1

## 2013-06-07 MED ORDER — FUROSEMIDE 10 MG/ML IJ SOLN
40.0000 mg | Freq: Once | INTRAMUSCULAR | Status: AC
Start: 2013-06-07 — End: 2013-06-07
  Administered 2013-06-07: 40 mg via INTRAVENOUS
  Filled 2013-06-07: qty 4

## 2013-06-07 MED ORDER — AMIODARONE HCL 200 MG PO TABS: 400.0000 mg | ORAL_TABLET | Freq: Every day | ORAL | Status: DC

## 2013-06-07 MED ORDER — AMIODARONE LOAD VIA INFUSION
150.0000 mg | Freq: Once | INTRAVENOUS | Status: AC
  Administered 2013-06-07: 150 mg via INTRAVENOUS
  Filled 2013-06-07: qty 83.34

## 2013-06-07 NOTE — Progress Notes (Signed)
  Amiodarone Drug - Drug Interaction Consult Note  Recommendations: Will reduce dose of simvastatin to 20 mg while on amiodarone. Will monitor effect of carvedilol  Amiodarone is metabolized by the cytochrome P450 system and therefore has the potential to cause many drug interactions. Amiodarone has an average plasma half-life of 50 days (range 20 to 100 days).   There is potential for drug interactions to occur several weeks or months after stopping treatment and the onset of drug interactions may be slow after initiating amiodarone.   [x]  Statins: Increased risk of myopathy. Simvastatin- restrict dose to 20mg  daily. Other statins: counsel patients to report any muscle pain or weakness immediately.  []  Anticoagulants: Amiodarone can increase anticoagulant effect. Consider warfarin dose reduction. Patients should be monitored closely and the dose of anticoagulant altered accordingly, remembering that amiodarone levels take several weeks to stabilize.  []  Antiepileptics: Amiodarone can increase plasma concentration of phenytoin, the dose should be reduced. Note that small changes in phenytoin dose can result in large changes in levels. Monitor patient and counsel on signs of toxicity.  []  Beta blockers: increased risk of bradycardia, AV block and myocardial depression. Sotalol - avoid concomitant use.  []   Calcium channel blockers (diltiazem and verapamil): increased risk of bradycardia, AV block and myocardial depression.  []   Cyclosporine: Amiodarone increases levels of cyclosporine. Reduced dose of cyclosporine is recommended.  []  Digoxin dose should be halved when amiodarone is started.  []  Diuretics: increased risk of cardiotoxicity if hypokalemia occurs.  []  Oral hypoglycemic agents (glyburide, glipizide, glimepiride): increased risk of hypoglycemia. Patient's glucose levels should be monitored closely when initiating amiodarone therapy.   []  Drugs that prolong the QT interval:   Torsades de pointes risk may be increased with concurrent use - avoid if possible.  Monitor QTc, also keep magnesium/potassium WNL if concurrent therapy can't be avoided. [name] Antibiotics: e.g. fluoroquinolones, erythromycin. . Antiarrhythmics: e.g. quinidine, procainamide, disopyramide, sotalol. . Antipsychotics: e.g. phenothiazines, haloperidol.  . Lithium, tricyclic antidepressants, and methadone.  Hughes Better, PharmD, BCPS Clinical Pharmacist 06/07/2013 9:33 AM

## 2013-06-07 NOTE — Progress Notes (Signed)
Patient ID: Jason Malone, male   DOB: 1940-06-20, 73 y.o.   MRN: [id number] TCTS DAILY ICU PROGRESS NOTE                   [address]            [place], [address]          [phone]   3 Days Post-Op Procedure(s) (LRB): VIDEO BRONCHOSCOPY (N/A) VIDEO ASSISTED THORACOSCOPY (VATS)/WEDGE RESECTION (Left)  Total Length of Stay:  LOS: 3 days   Subjective: afib during night, now back in sinus, some wheezing this am, no fever, bm yesterday  Objective: Vital signs in last 24 hours: Temp:  [97.9 F (36.6 C)-99.6 F (37.6 C)] 97.9 F (36.6 C) (01/29 0400) Pulse Rate:  [66-153] 71 (01/29 0621) Cardiac Rhythm:  [-] Normal sinus rhythm (01/29 0554) Resp:  [13-23] 20 (01/29 0621) BP: (105-169)/(55-97) 106/55 mmHg (01/29 0500) SpO2:  [91 %-98 %] 98 % (01/29 0621)  Filed Weights   06/04/13 1438 06/05/13 0500  Weight: 175 lb (79.379 kg) 174 lb 2.6 oz (79 kg)    Weight change:    Hemodynamic parameters for last 24 hours:    Intake/Output from previous day: 01/28 0701 - 01/29 0700 In: 2400.3 [P.O.:1100; I.V.:1300.3] Out: 2135 [Urine:1825; Chest Tube:310]  Intake/Output this shift:    Current Meds: Scheduled Meds: . amiodarone  400 mg Oral Q12H   Followed by  . [START ON 06/15/2013] amiodarone  400 mg Oral Daily  . bisacodyl  10 mg Oral Daily  . bisacodyl  10 mg Rectal Once  . carvedilol  6.25 mg Oral BID  . enoxaparin (LOVENOX) injection  30 mg Subcutaneous Q24H  . fentaNYL   Intravenous Q4H  . furosemide  40 mg Intravenous Once  . ipratropium  0.5 mg Nebulization BID  . levalbuterol  0.63 mg Nebulization BID  . lisinopril  2.5 mg Oral Daily  . polycarbophil  625 mg Oral Daily  . simvastatin  40 mg Oral q morning - 10a  . tamsulosin  0.4 mg Oral Daily   Continuous Infusions: . amiodarone (NEXTERONE PREMIX) 360 mg/200 mL dextrose 30 mg/hr (06/07/13 0614)  . bupivacaine ON-Q pain pump    . dextrose 5 % and 0.45 % NaCl with KCl 10 mEq/L 50 mL/hr at  06/06/13 1900   PRN Meds:.diphenhydrAMINE, diphenhydrAMINE, levalbuterol, naloxone, ondansetron (ZOFRAN) IV, oxyCODONE-acetaminophen, potassium chloride, simethicone, sodium chloride  General appearance: alert, cooperative and no distress Neurologic: intact Heart: regular rate and rhythm, S1, S2 normal, no murmur, click, rub or gallop Lungs: rhonchi LUL Abdomen: soft, non-tender; bowel sounds normal; no masses,  no organomegaly Extremities: extremities normal, atraumatic, no cyanosis or edema and Homans sign is negative, no sign of DVT Wound: few bubbles air leak with cough   Lab Results: CBC: Recent Labs  06/06/13 0455 06/07/13 0555  WBC 18.7* 17.9*  HGB 13.3 12.7*  HCT 39.3 37.4*  PLT 140* 142*   BMET:  Recent Labs  06/06/13 0455 06/07/13 0555  NA 143 139  K 4.1 3.7  CL 105 102  CO2 23 27  GLUCOSE 136* 150*  BUN 10 11  CREATININE 1.01 0.98  CALCIUM 8.8 8.4    PT/INR: No results found for this basename: LABPROT, INR,  in the last 72 hours Radiology: Dg Chest Port 1 View  06/07/2013   CLINICAL DATA:  Postop chest tube  EXAM: PORTABLE CHEST - 1 VIEW  COMPARISON:  06/06/2013  FINDINGS: Two chest tubes  on the left.  Negative for pneumothorax.  Right subclavian central venous catheter tip in the SVC. No pneumothorax on the right.  Improved lung volumes.  There remains left lower lobe atelectasis.  IMPRESSION: Improved lung volume  Negative for pneumothorax.   Electronically Signed   By: Franchot Gallo M.D.   On: 06/07/2013 08:12   Dg Chest Port 1 View  06/06/2013   CLINICAL DATA:  Followup left-sided chest tubes.  EXAM: PORTABLE CHEST - 1 VIEW  COMPARISON:  06/05/2013  FINDINGS: No definite change in the position of the chest tubes. This is allowing for the reversed lordotic positioning and low lung volumes.  Mild atelectasis is noted at the left base and along the left lateral lower hemi thorax. No lung consolidation or edema.  Cardiac silhouette is normal in size.  Right  subclavian central venous line is stable.  No pneumothorax.  IMPRESSION: 1. No pneumothorax. Stable left-sided chest tubes and right subclavian central venous line. 2. Left basilar opacity appears less prominent on the current study, which is likely due to differences in patient positioning. This is felt to be atelectasis. No convincing infiltrate. No pulmonary edema.   Electronically Signed   By: Lajean Manes M.D.   On: 06/06/2013 08:25     Assessment/Plan: S/P Procedure(s) (LRB): VIDEO BRONCHOSCOPY (N/A) VIDEO ASSISTED THORACOSCOPY (VATS)/WEDGE RESECTION (Left) Mobilize Diuresis- lasix this am Continue foley due to bladder outlet obstruction- started Flomax yesterday d/c foley tomorrow Convert to po Cordarone D/c posterior chest tube Mild hypokalemia, replace k  Jason Malone B 06/07/2013 8:23 AM

## 2013-06-08 ENCOUNTER — Inpatient Hospital Stay (HOSPITAL_COMMUNITY): Payer: Medicare Other

## 2013-06-08 LAB — BASIC METABOLIC PANEL
BUN: 12 mg/dL (ref 6–23)
CO2: 28 mEq/L (ref 19–32)
Calcium: 8.4 mg/dL (ref 8.4–10.5)
Chloride: 99 mEq/L (ref 96–112)
Creatinine, Ser: 0.98 mg/dL (ref 0.50–1.35)
GFR calc Af Amer: >90 mL/min (ref >90)
GFR calc non Af Amer: 81 mL/min — ABNORMAL LOW (ref >90)
Glucose, Bld: 127 mg/dL — ABNORMAL HIGH (ref 70–99)
Potassium: 3.6 mEq/L — ABNORMAL LOW (ref 3.7–5.3)
Sodium: 138 mEq/L (ref 137–147)

## 2013-06-08 LAB — CBC
HCT: 35.3 % — ABNORMAL LOW (ref 39.0–52.0)
Hemoglobin: 12 g/dL — ABNORMAL LOW (ref 13.0–17.0)
MCH: 30.7 pg (ref 26.0–34.0)
MCHC: 34 g/dL (ref 30.0–36.0)
MCV: 90.3 fL (ref 78.0–100.0)
Platelets: 149 10*3/uL — ABNORMAL LOW (ref 150–400)
RBC: 3.91 MIL/uL — ABNORMAL LOW (ref 4.22–5.81)
RDW: 14.6 % (ref 11.5–15.5)
WBC: 14.4 10*3/uL — ABNORMAL HIGH (ref 4.0–10.5)

## 2013-06-08 LAB — MAGNESIUM: Magnesium: 1.7 mg/dL (ref 1.5–2.5)

## 2013-06-08 LAB — TSH: TSH: 4.221 u[IU]/mL (ref 0.350–4.500)

## 2013-06-08 MED ORDER — AMIODARONE IV BOLUS ONLY 150 MG/100ML: 150.0000 mg | Freq: Once | INTRAVENOUS | Status: DC

## 2013-06-08 MED ORDER — POTASSIUM CHLORIDE CRYS ER 20 MEQ PO TBCR
20.0000 meq | EXTENDED_RELEASE_TABLET | Freq: Two times a day (BID) | ORAL | Status: AC
  Administered 2013-06-08 (×2): 20 meq via ORAL
  Filled 2013-06-08 (×2): qty 1

## 2013-06-08 NOTE — Progress Notes (Signed)
[facility] DAILY ICU PROGRESS NOTE                   [address]            [place] [address]          [phone]   4 Days Post-Op Procedure(s) (LRB): VIDEO BRONCHOSCOPY (N/A) VIDEO ASSISTED THORACOSCOPY (VATS)/WEDGE RESECTION (Left)  Total Length of Stay:  LOS: 4 days   Subjective:  feeling etter  Objective: Vital signs in last 24 hours: Temp:  [98 F (36.7 C)-98.8 F (37.1 C)] 98.1 F (36.7 C) (01/30 0755) Pulse Rate:  [64-76] 71 (01/30 0755) Cardiac Rhythm:  [-] Normal sinus rhythm (01/30 0755) Resp:  [16-94] 17 (01/30 0755) BP: (111-143)/(64-68) 111/65 mmHg (01/30 0755) SpO2:  [94 %-96 %] 94 % (01/30 0958)  Filed Weights   06/04/13 1438 06/05/13 0500  Weight: 175 lb (79.379 kg) 174 lb 2.6 oz (79 kg)    Weight change:    Hemodynamic parameters for last 24 hours:    Intake/Output from previous day: 01/29 0701 - 01/30 0700 In: 1290 [P.O.:480; I.V.:810] Out: 2100 [Urine:1850; Chest Tube:250]  Intake/Output this shift: Total I/O In: 20 [I.V.:20] Out: 255 [Urine:175; Chest Tube:80]  Current Meds: Scheduled Meds: . amiodarone  400 mg Oral Q12H   Followed by  . [START ON 06/15/2013] amiodarone  400 mg Oral Daily  . bisacodyl  10 mg Oral Daily  . bisacodyl  10 mg Rectal Once  . carvedilol  6.25 mg Oral BID  . enoxaparin (LOVENOX) injection  30 mg Subcutaneous Q24H  . fentaNYL   Intravenous Q4H  . ipratropium  0.5 mg Nebulization BID  . levalbuterol  0.63 mg Nebulization BID  . lisinopril  2.5 mg Oral Daily  . polycarbophil  625 mg Oral Daily  . simvastatin  20 mg Oral q morning - 10a  . tamsulosin  0.4 mg Oral Daily   Continuous Infusions: . amiodarone (NEXTERONE PREMIX) 360 mg/200 mL dextrose Stopped (06/07/13 1200)  . dextrose 5 % and 0.45 % NaCl with KCl 10 mEq/L 20 mL/hr at 06/08/13 0633   PRN Meds:.diphenhydrAMINE, diphenhydrAMINE, levalbuterol, naloxone, ondansetron (ZOFRAN) IV, oxyCODONE-acetaminophen, potassium chloride,  simethicone, sodium chloride  General appearance: alert, cooperative and no distress Heart: regular rate and rhythm Lungs: mildly dim in bases Abdomen: mild distension, non-tender, + NABS Extremities: no edema Wound: dressinsgs CDI  Lab Results: CBC: Recent Labs  06/07/13 0555 06/08/13 0415  WBC 17.9* 14.4*  HGB 12.7* 12.0*  HCT 37.4* 35.3*  PLT 142* 149*   BMET:  Recent Labs  06/07/13 0555 06/08/13 0415  NA 139 138  K 3.7 3.6*  CL 102 99  CO2 27 28  GLUCOSE 150* 127*  BUN 11 12  CREATININE 0.98 0.98  CALCIUM 8.4 8.4    PT/INR: No results found for this basename: LABPROT, INR,  in the last 72 hours Radiology: Dg Chest Port 1 View  06/08/2013   CLINICAL DATA:  Evaluate left chest tube.  EXAM: PORTABLE CHEST - 1 VIEW  COMPARISON:  DG CHEST 1V PORT dated 06/07/2013; CT BIOPSY dated 04/25/2013  FINDINGS: One left chest tube has been removed. One left chest tube remains. There appears to be a tiny left pneumothorax. Heart and mediastinum are stable. Stable fullness along the left side of the mediastinum. Right subclavian central line tip in the SVC region. The right lung is clear. Stable elevation of left hemidiaphragm with left basilar atelectasis. Displaced left fifth rib fracture.  IMPRESSION: Probable small left pneumothorax. A left chest tube is still present.  Stable volume loss in the left hemithorax.   Electronically Signed   By: Markus Daft M.D.   On: 06/08/2013 07:43   Dg Chest Port 1 View  06/07/2013   CLINICAL DATA:  Postop chest tube  EXAM: PORTABLE CHEST - 1 VIEW  COMPARISON:  06/06/2013  FINDINGS: Two chest tubes on the left.  Negative for pneumothorax.  Right subclavian central venous catheter tip in the SVC. No pneumothorax on the right.  Improved lung volumes.  There remains left lower lobe atelectasis.  IMPRESSION: Improved lung volume  Negative for pneumothorax.   Electronically Signed   By: Franchot Gallo M.D.   On: 06/07/2013 08:12     Assessment/Plan: S/P  Procedure(s) (LRB): VIDEO BRONCHOSCOPY (N/A) VIDEO ASSISTED THORACOSCOPY (VATS)/WEDGE RESECTION (Left)  1 Doing ok, foley out this am 2 Chest tube with small airleak, tiny apical pntx- keep CT in for now 3 maintaining SR, replace K+- cont amio 4 pulm toilet/rehab- cont 5 cont PCA for now with CT in place   GOLD,WAYNE E 06/08/2013 11:14 AM

## 2013-06-08 NOTE — Progress Notes (Signed)
[address]       [place] [address]             [phone]              Discharge Summary  Name: Jason Malone DOB: 1940-05-26 73 y.o. MRN: [id number]   Admission Date: 06/04/2013 Discharge Date: 06/11/2013    Admitting Diagnosis: Left hilar mass   Discharge Diagnosis:  Well differentiated neuroendocrine tumor (carcinoid -T2a, N0) Postoperative urinary retention due to bladder outlet obstruction Postoperative atrial fibrillation   Past Medical History  Diagnosis Date  . Hypertension   . Hyperlipemia   . Full dentures   . Wears glasses   . Cancer   . Lung cancer   . Enlarged prostate   . A-fib   . Hypomagnesemia   . H/O hypokalemia   . Diverticulitis      Procedures: VIDEO BRONCHOSCOPY - 06/04/2013 LEFT VIDEO ASSISTED THORACOSCOPY/MINI-THORACOTOMY  LEFT UPPER LOBECTOMY  RESECTION OF BRONCHIAL STUMP  LYMPH NODE DISSECTION    HPI:  The patient is a 73 y.o. male who originally presented to [facility] in November 2014 with a sudden and new onset of atrial fibrillation. At that time, a chest x-ray showed a left hilar mass. The patient denied shortness of breath, but  recently he has coughed up a few flecks of blood. Workup included bronchoscopy by Dr. Rich Number who describes a nearly obstructing mass found proximally in the apical posterior segment of the left upper lobe. The mass was described as large, bloody, endobronchial, exophytic, friable, fungating, and infiltrative . Endobronchial biopsies did not reveal a definitive diagnosis . The patient had a CT scan and PET scan MRI of the brain, pulmonary function studies . Subsequently needle biopsy of the left lung mass showed typical carcinoid.  He was referred to Dr. Servando Snare for thoracic surgical evaluation.  It was felt that he would require surgical resection at this time, and possible pneumonectomy because of the size and location of the mass. All risks, benefits and alternatives of  surgery were explained in detail, and the patient agreed to proceed.    Hospital Course:  The patient was admitted to [facility] on 06/04/2013. The patient was taken to the operating room and underwent the above procedure.    The postoperative course was notable for urinary retention, requiring replacement of a Foley catheter and initiation of Flomax.  He was allowed 48 hours of bladder rest and the catheter was removed.  He then was unable to void again. He was in and out cath'd once. Bladder scan showed > 400 so the foley was reinserted. I called Dr. Louis Meckel on 1/31 and said to leave the foley in and continue Flomax. Dr. Louis Meckel will see the patient sometime this week as an outpatient.  He has otherwise done well.  He has remained afebrile and vital signs are stable.  Chest tubes have removed in the standard fashion, and follow up chest x-rays have remained stable. He then went into a fib with RVR. He was put on Amiodarone. He converted to sinus rhythm and remained.He is ambulating in the halls without difficulty and tolerating a regular diet.  Final pathology revealed a well differentiated neuroendocrine (carcinoid) tumor.  Incisions are healing well and pain is controlled on oral pain meds.  He is maintaining sinus rhythm.    Recent vital signs:  Filed Vitals:   06/10/13 0733  BP:   Pulse:   Temp: 97.7 F (36.5 C)  Resp:     Recent laboratory studies:  CBC:  Recent Labs  06/08/13 0415  WBC 14.4*  HGB 12.0*  HCT 35.3*  PLT 149*   BMET:   Recent Labs  06/09/13 0500 06/10/13 0342  NA 140 143  K 3.7 4.1  CL 102 105  CO2 26 28  GLUCOSE 121* 111*  BUN 12 13  CREATININE 0.94 1.04  CALCIUM 8.7 8.7    PT/INR: No results found for this basename: LABPROT, INR,  in the last 72 hours   Discharge Medications:     Medication List         amiodarone 200 MG tablet  Commonly known as:  PACERONE  Take 1 tablet (200 mg total) by mouth 2 (two) times daily. For one week;then  take Amiodarone 200 mg by mouth daily thereafter     b complex vitamins capsule  Take 1 capsule by mouth daily.     carvedilol 6.25 MG tablet  Commonly known as:  COREG  Take 1 tablet (6.25 mg total) by mouth 2 (two) times daily.     cloNIDine 0.1 MG tablet  Commonly known as:  CATAPRES  Take 1 tablet (0.1 mg total) by mouth 2 (two) times daily.     lisinopril 10 MG tablet  Commonly known as:  PRINIVIL,ZESTRIL  Take 10 mg by mouth daily.     multivitamin with minerals tablet  Take 1 tablet by mouth daily.     oxyCODONE-acetaminophen 5-325 MG per tablet  Commonly known as:  PERCOCET/ROXICET  Take 1 tablet by mouth every 4 (four) hours as needed for severe pain.     polycarbophil 625 MG tablet  Commonly known as:  FIBERCON  Take 625 mg by mouth daily.     simvastatin 40 MG tablet  Commonly known as:  ZOCOR  Take 40 mg by mouth every morning.     vitamin C 500 MG tablet  Commonly known as:  ASCORBIC ACID  Take 500 mg by mouth daily.        Discharge Instructions:  The patient is to refrain from driving, heavy lifting or strenuous activity.  May shower daily and clean incisions with soap and water.  May resume regular diet.   Follow Up:  Future Appointments Provider Department Dept Phone   06/15/2013 10:15 AM Luevenia Maxin [facility] Nurse [facility] [place] [phone]   06/28/2013 9:30 AM [name], MD [facility] [phone]     Follow-up Information   Follow up with GERHARDT,EDWARD B, MD On 06/08/2013. (Have a chest x-ray at [facility] at 9:00, then see MD at 9:30)    Specialty:  Cardiothoracic Surgery   Contact information:   [address] [place] [address] [phone]       Follow up with [facility] NURSE On 06/15/2013. (Dr. Everrett Coombe nurse will remove sutures at 10:15)       Follow up with Ida Rogue, MD. (Call for a follow up appointment for one week)      Specialty:  Cardiology   Contact information:   [place] [address] [phone]        Follow-up Information   Follow up with [name], MD On 06/08/2013. (Have a chest x-ray at [place] at 9:00, then see MD at 9:30)    Specialty:  Cardiothoracic Surgery   Contact information:   [place]  [address] [phone]       Follow up with [facility] NURSE On 06/15/2013. (Dr. Everrett Coombe nurse will remove sutures at 10:15)       Follow up with Ida Rogue, MD. (Call for a follow up appointment for one week)    Specialty:  Cardiology   Contact information:   [facility] [place] [address] [phone]        Nani Skillern 06/10/2013, 10:57 AM

## 2013-06-08 NOTE — Progress Notes (Signed)
Utilization review completed.  

## 2013-06-09 ENCOUNTER — Inpatient Hospital Stay (HOSPITAL_COMMUNITY): Payer: Medicare Other

## 2013-06-09 LAB — BASIC METABOLIC PANEL
BUN: 12 mg/dL (ref 6–23)
CO2: 26 mEq/L (ref 19–32)
Calcium: 8.7 mg/dL (ref 8.4–10.5)
Chloride: 102 mEq/L (ref 96–112)
Creatinine, Ser: 0.94 mg/dL (ref 0.50–1.35)
GFR calc Af Amer: >90 mL/min (ref >90)
GFR calc non Af Amer: 83 mL/min — ABNORMAL LOW (ref >90)
Glucose, Bld: 121 mg/dL — ABNORMAL HIGH (ref 70–99)
Potassium: 3.7 mEq/L (ref 3.7–5.3)
Sodium: 140 mEq/L (ref 137–147)

## 2013-06-09 MED ORDER — LISINOPRIL 5 MG PO TABS
5.0000 mg | ORAL_TABLET | Freq: Every day | ORAL | Status: DC
  Administered 2013-06-10: 5 mg via ORAL
  Filled 2013-06-09: qty 1

## 2013-06-09 MED ORDER — AMIODARONE IV BOLUS ONLY 150 MG/100ML
150.0000 mg | Freq: Once | INTRAVENOUS | Status: AC
  Administered 2013-06-09: 150 mg via INTRAVENOUS
  Filled 2013-06-09: qty 100

## 2013-06-09 MED ORDER — AMIODARONE LOAD VIA INFUSION
150.0000 mg | Freq: Once | INTRAVENOUS | Status: DC
  Filled 2013-06-09: qty 83.34

## 2013-06-09 MED ORDER — POTASSIUM CHLORIDE CRYS ER 20 MEQ PO TBCR
30.0000 meq | EXTENDED_RELEASE_TABLET | Freq: Once | ORAL | Status: AC
  Administered 2013-06-09: 30 meq via ORAL
  Filled 2013-06-09: qty 1

## 2013-06-09 NOTE — Progress Notes (Addendum)
Flipped into a-fib with at rate of 120-130s, BP 160/97, states that he can feel his "heart fluttering." Dr. Servando Snare notified. Order received to give 150 mg IV amiodarone bolus. Once medication was received from pharmacy patient had converted back into NSR with a rate in the 60s. Will hold medication for now and continue to monitor.    Flipped back into a-fib at 0510. Amiodarone bolus given. Will continue to monitor.   Lum Babe, RN

## 2013-06-09 NOTE — Progress Notes (Signed)
Fentanyl pca waisted with Eustace Moore RN [address], [place]

## 2013-06-09 NOTE — Progress Notes (Addendum)
      [address]       [place] [address]             [phone]       5 Days Post-Op Procedure(s) (LRB): VIDEO BRONCHOSCOPY (N/A) VIDEO ASSISTED THORACOSCOPY (VATS)/WEDGE RESECTION (Left)  Subjective: Patient hopes to have chest tube removed.  Objective: Vital signs in last 24 hours: Temp:  [98.4 F (36.9 C)-98.9 F (37.2 C)] 98.9 F (37.2 C) (01/31 0800) Pulse Rate:  [65-129] 109 (01/31 1000) Cardiac Rhythm:  [-] Normal sinus rhythm (01/31 0800) Resp:  [15-24] 19 (01/31 1000) BP: (137-162)/(58-97) 156/76 mmHg (01/31 1000) SpO2:  [93 %-96 %] 93 % (01/31 1000)     Intake/Output from previous day: 01/30 0701 - 01/31 0700 In: 1160 [P.O.:600; I.V.:560] Out: 1065 [Urine:925; Chest Tube:140]   Physical Exam:  Cardiovascular: RRR Pulmonary: Clear to auscultation bilaterally; no rales, wheezes, or rhonchi. Abdomen: Soft, non tender, bowel sounds present. Extremities: Trace bilateral lower extremity edema. Wounds: Dressing is clean and dry.   Chest Tube: no air leak  Lab Results: CBC: Recent Labs  06/07/13 0555 06/08/13 0415  WBC 17.9* 14.4*  HGB 12.7* 12.0*  HCT 37.4* 35.3*  PLT 142* 149*   BMET:  Recent Labs  06/08/13 0415 06/09/13 0500  NA 138 140  K 3.6* 3.7  CL 99 102  CO2 28 26  GLUCOSE 127* 121*  BUN 12 12  CREATININE 0.98 0.94  CALCIUM 8.4 8.7    PT/INR: No results found for this basename: LABPROT, INR,  in the last 72 hours ABG:  INR: Will add last result for INR, ABG once components are confirmed Will add last 4 CBG results once components are confirmed  Assessment/Plan:  1. CV - PAF. Amiodarone bolus was ordered, but not given as Jason Malone converted to sinus rhythm. Appears to be in SR with occ PAC.On Coreg 6.25 bid,Amiodarone 400 bid and Lisinopril 2.5 daily. Will increase Lisinopril for better bp control. 2.  Pulmonary - Chest tube with minor output 190 cc since 7 am yesterday. CXR shows no pneumothorax, side port  working its way out of chest, decreased left lung atelectasis. Will remove chest tube. Encourage incentive spirometer 3. Supplement potassium 4. Mild ABL anemia-last H and H 12 and 35.3 5.Urinary retention-Foley put back in this am as had >400 after bladder scan and in and out cath. Jason Malone has been on flomax for several days. Discussed with Dr. Louis Meckel who is on call for urology. Jason Malone suggests leaving foley and continuing with Flomax. Patient will follow up with Dr. Louis Meckel mid to end of next week as an outpatient. 6.Will stop PCA after chest tube removed.  ZIMMERMAN,DONIELLE MPA-C 06/09/2013,11:21 AM  2nd attempt to get foley out unsuccessful  Have contacted urology, will leave foley in chest tube out I have seen and examined Jason Malone and agree with the above assessment  and plan.  [name] MD Beeper [phone] Office [phone] 06/09/2013 1:18 PM

## 2013-06-10 ENCOUNTER — Inpatient Hospital Stay (HOSPITAL_COMMUNITY): Payer: Medicare Other

## 2013-06-10 HISTORY — PX: LUNG LOBECTOMY: SHX167

## 2013-06-10 LAB — BASIC METABOLIC PANEL
BUN: 13 mg/dL (ref 6–23)
CO2: 28 mEq/L (ref 19–32)
Calcium: 8.7 mg/dL (ref 8.4–10.5)
Chloride: 105 mEq/L (ref 96–112)
Creatinine, Ser: 1.04 mg/dL (ref 0.50–1.35)
GFR calc Af Amer: 82 mL/min — ABNORMAL LOW (ref >90)
GFR calc non Af Amer: 71 mL/min — ABNORMAL LOW (ref >90)
Glucose, Bld: 111 mg/dL — ABNORMAL HIGH (ref 70–99)
Potassium: 4.1 mEq/L (ref 3.7–5.3)
Sodium: 143 mEq/L (ref 137–147)

## 2013-06-10 MED ORDER — AMIODARONE HCL 200 MG PO TABS: 200.0000 mg | ORAL_TABLET | Freq: Two times a day (BID) | ORAL | Status: DC

## 2013-06-10 MED ORDER — LISINOPRIL 10 MG PO TABS: 10.0000 mg | ORAL_TABLET | Freq: Every day | ORAL | Status: DC

## 2013-06-10 MED ORDER — OXYCODONE-ACETAMINOPHEN 5-325 MG PO TABS: 1.0000 | ORAL_TABLET | ORAL | Status: DC | PRN

## 2013-06-10 MED ORDER — CARVEDILOL 6.25 MG PO TABS: 6.2500 mg | ORAL_TABLET | Freq: Two times a day (BID) | ORAL | Status: DC

## 2013-06-10 NOTE — Progress Notes (Addendum)
[address]       [place] [address]             [phone]       6 Days Post-Op Procedure(s) (LRB): VIDEO BRONCHOSCOPY (N/A) VIDEO ASSISTED THORACOSCOPY (VATS)/WEDGE RESECTION (Left)  Subjective: Patient feels fairly well.  Objective: Vital signs in last 24 hours: Temp:  [97.7 F (36.5 C)-99.1 F (37.3 C)] 97.7 F (36.5 C) (02/01 0733) Pulse Rate:  [58-76] 58 (02/01 0710) Cardiac Rhythm:  [-] Normal sinus rhythm (02/01 0320) Resp:  [11-21] 16 (02/01 0710) BP: (113-157)/(60-71) 157/70 mmHg (02/01 0710) SpO2:  [94 %-97 %] 95 % (02/01 0710)     Intake/Output from previous day: 01/31 0701 - 02/01 0700 In: 673 [P.O.:360; I.V.:313] Out: 1375 [Urine:1325; Chest Tube:50]   Physical Exam:  Cardiovascular: RRR Pulmonary: Clear to auscultation bilaterally; no rales, wheezes, or rhonchi. Abdomen: Soft, non tender, bowel sounds present. Extremities: Trace bilateral lower extremity edema. Wounds: Dressing is clean and dry.   Chest Tube: no air leak  Lab Results: CBC:  Recent Labs  06/08/13 0415  WBC 14.4*  HGB 12.0*  HCT 35.3*  PLT 149*   BMET:   Recent Labs  06/09/13 0500 06/10/13 0342  NA 140 143  K 3.7 4.1  CL 102 105  CO2 26 28  GLUCOSE 121* 111*  BUN 12 13  CREATININE 0.94 1.04  CALCIUM 8.7 8.7    Dg Chest 2 View  06/10/2013   CLINICAL DATA:  Pneumothorax  EXAM: CHEST - 2 VIEW  COMPARISON:  the previous day's study  FINDINGS: No pneumothorax evident. Improved aeration medially in the left upper lung. Persistent elevation of left diaphragmatic leaflet. Stable right subclavian central line. Right lung clear. Heart size normal. . No effusion. Spurring in the lower thoracic spine.  IMPRESSION: Improving left lung aeration.  No pneumothorax evident.   Electronically Signed   By: Arne Cleveland M.D.   On: 06/10/2013 08:25   Dg Chest Port 1 View  06/09/2013   CLINICAL DATA:  Left chest tube removal. Status post left upper lobe  resection.  EXAM: PORTABLE CHEST - 1 VIEW  COMPARISON:  06/09/2013 at 7:46 a.m.  FINDINGS: There is evidence of a pleural line adjacent to the lateral left second rib and may reflect a minimal left pneumothorax. This is equivocal.  There is mild residual hazy opacity in the left mid and lower lung zone, unchanged.  Right lung is clear.  Right central venous line is stable and well positioned.  IMPRESSION: 1. Status post removal of the left chest tube. There is somewhat equivocal evidence of a minimal left apical pneumothorax. 2. No other change.   Electronically Signed   By: Lajean Manes M.D.   On: 06/09/2013 12:57   Dg Chest Port 1 View  06/09/2013   CLINICAL DATA:  Postop from left lung resection. Followup pneumothorax.  EXAM: PORTABLE CHEST - 1 VIEW  COMPARISON:  06/08/2013  FINDINGS: Left chest tube remains in place although the side port is now seen in the left chest wall soft tissues. Increased subcutaneous emphysema seen in the inferior left chest wall, however no pneumothorax is visualized. Left lung volume loss again noted, however there is decreased atelectasis seen in the left lung base. Right lung remains clear. Heart size is stable.  IMPRESSION: Left chest tube side port now seen in the left chest wall soft tissues. No pneumothorax visualized and decreased atelectasis in the left lung base.  Electronically Signed   By: Earle Gell M.D.   On: 06/09/2013 08:21    PT/INR: No results found for this basename: LABPROT, INR,  in the last 72 hours ABG:  INR: Will add last result for INR, ABG once components are confirmed Will add last 4 CBG results once components are confirmed  Assessment/Plan:  1. CV - PAF. Amiodarone bolus was ordered, but not given as he converted to sinus rhythm. Appears to be in SR with occ PAC.On Coreg 6.25 bid,Amiodarone 400 bid and Lisinopril 5 daily. Will increase Lisinopril for better bp control. 2.  Pulmonary - Chest tube with minor output 190 cc since 7 am  yesterday. CXR shows no pneumothorax, improved aeration on the left, persistent left hemidiaphragm elevation, and right lung clear.Encourage incentive spirometer 3. Mild ABL anemia-last H and H 12 and 35.3 4.Urinary retention-Foley put back yesterday in am.He has been on flomax for several days, which will be continued. Discussed with Dr. Louis Meckel (urologist). Patient will be discharged with foley and will follow up with Dr. Louis Meckel mid to end of next week as an outpatient. 5. Will discuss discharge disposition with surgeon.  ZIMMERMAN,DONIELLE MPA-C 06/10/2013,10:15 AM   pateint wants to go home  Today, clear about urology follow up Chest xray today looks good Plan d/c today I have seen and examined Kaulana Whipp and agree with the above assessment  and plan.  [name] MD Beeper [phone] Office [phone] 06/10/2013 12:47 PM

## 2013-06-10 NOTE — Progress Notes (Signed)
Pt's wife instructed on how to empty foley catheter bag and how to do good foley/peri care at least 3 times daily to prevent infection. Armandina Gemma, Kirby Crigler

## 2013-06-10 NOTE — Progress Notes (Signed)
Discharge instructions given to pt and his wife. Both verbalized understanding of follow-up appts, wound care, home meds, pain control, activity levels, s/s of complications and when to call MD/EMS. Armandina Gemma, Kirby Crigler

## 2013-06-10 NOTE — Discharge Summary (Signed)
[address]       [place] [address]             [phone]              Discharge Summary  Name: Jason Malone DOB: [birth year] 73 y.o. MRN: [id number]   Admission Date: 06/04/2013 Discharge Date: 06/11/2013    Admitting Diagnosis: Left hilar mass   Discharge Diagnosis:  Well differentiated neuroendocrine tumor (carcinoid -T2a, N0) Postoperative urinary retention due to bladder outlet obstruction Postoperative atrial fibrillation   Past Medical History  Diagnosis Date  . Hypertension   . Hyperlipemia   . Full dentures   . Wears glasses   . Cancer   . Lung cancer   . Enlarged prostate   . A-fib   . Hypomagnesemia   . H/O hypokalemia   . Diverticulitis      Procedures: VIDEO BRONCHOSCOPY - 06/04/2013 LEFT VIDEO ASSISTED THORACOSCOPY/MINI-THORACOTOMY  LEFT UPPER LOBECTOMY  RESECTION OF BRONCHIAL STUMP  LYMPH NODE DISSECTION    HPI:  The patient is a 73 y.o. male who originally presented to [facility] in November 2014 with a sudden and new onset of atrial fibrillation. At that time, a chest x-ray showed a left hilar mass. The patient denied shortness of breath, but  recently he has coughed up a few flecks of blood. Workup included bronchoscopy by Dr. Rich Number who describes a nearly obstructing mass found proximally in the apical posterior segment of the left upper lobe. The mass was described as large, bloody, endobronchial, exophytic, friable, fungating, and infiltrative . Endobronchial biopsies did not reveal a definitive diagnosis . The patient had a CT scan and PET scan MRI of the brain, pulmonary function studies . Subsequently needle biopsy of the left lung mass showed typical carcinoid.  He was referred to Dr. Servando Snare for thoracic surgical evaluation.  It was felt that he would require surgical resection at this time, and possible pneumonectomy because of the size and location of the mass. All risks, benefits and alternatives of  surgery were explained in detail, and the patient agreed to proceed.    Hospital Course:  The patient was admitted to [facility] on 06/04/2013. The patient was taken to the operating room and underwent the above procedure.    The postoperative course was notable for urinary retention, requiring replacement of a Foley catheter and initiation of Flomax.  He was allowed 48 hours of bladder rest and the catheter was removed.  He then was unable to void again. He was in and out cath'd once. Bladder scan showed > 400 so the foley was reinserted. I called Dr. Louis Meckel on 1/31 and said to leave the foley in and continue Flomax. Dr. Louis Meckel will see the patient sometime this week as an outpatient.  He has otherwise done well.  He has remained afebrile and vital signs are stable.  Chest tubes have removed in the standard fashion, and follow up chest x-rays have remained stable. He then went into a fib with RVR. He was put on Amiodarone. He converted to sinus rhythm and remained.He is ambulating in the halls without difficulty and tolerating a regular diet.  Final pathology revealed a well differentiated neuroendocrine (carcinoid) tumor.  Incisions are healing well and pain is controlled on oral pain meds.  He is maintaining sinus rhythm.    Recent vital signs:  Filed Vitals:   06/10/13 0733  BP:   Pulse:   Temp: 97.7 F (36.5 C)  Resp:     Recent laboratory studies:  CBC:  Recent Labs  06/08/13 0415  WBC 14.4*  HGB 12.0*  HCT 35.3*  PLT 149*   BMET:   Recent Labs  06/09/13 0500 06/10/13 0342  NA 140 143  K 3.7 4.1  CL 102 105  CO2 26 28  GLUCOSE 121* 111*  BUN 12 13  CREATININE 0.94 1.04  CALCIUM 8.7 8.7    PT/INR: No results found for this basename: LABPROT, INR,  in the last 72 hours   Discharge Medications:     Medication List         amiodarone 200 MG tablet  Commonly known as:  PACERONE  Take 1 tablet (200 mg total) by mouth 2 (two) times daily. For one week;then  take Amiodarone 200 mg by mouth daily thereafter     b complex vitamins capsule  Take 1 capsule by mouth daily.     carvedilol 6.25 MG tablet  Commonly known as:  COREG  Take 1 tablet (6.25 mg total) by mouth 2 (two) times daily.     cloNIDine 0.1 MG tablet  Commonly known as:  CATAPRES  Take 1 tablet (0.1 mg total) by mouth 2 (two) times daily.     lisinopril 10 MG tablet  Commonly known as:  PRINIVIL,ZESTRIL  Take 10 mg by mouth daily.     multivitamin with minerals tablet  Take 1 tablet by mouth daily.     oxyCODONE-acetaminophen 5-325 MG per tablet  Commonly known as:  PERCOCET/ROXICET  Take 1 tablet by mouth every 4 (four) hours as needed for severe pain.     polycarbophil 625 MG tablet  Commonly known as:  FIBERCON  Take 625 mg by mouth daily.     simvastatin 40 MG tablet  Commonly known as:  ZOCOR  Take 40 mg by mouth every morning.     vitamin C 500 MG tablet  Commonly known as:  ASCORBIC ACID  Take 500 mg by mouth daily.        Discharge Instructions:  The patient is to refrain from driving, heavy lifting or strenuous activity.  May shower daily and clean incisions with soap and water.  May resume regular diet.   Follow Up:  Future Appointments Provider Department Dept Phone   06/15/2013 10:15 AM Luevenia Maxin [facility] Nurse [facility] [place] [phone]   06/28/2013 9:30 AM [name], MD [facility] [phone]     Follow-up Information   Follow up with GERHARDT,EDWARD B, MD On 06/28/2013. (Have a chest x-ray at [place] at 8:30, then see MD at 9:30)    Specialty:  Cardiothoracic Surgery   Contact information:   [address] [place] [address] [phone]       Follow up with [facility] NURSE On 06/15/2013. (Dr. Everrett Coombe nurse will remove sutures at 10:15)       Follow up with Ida Rogue, MD. (Call for a follow up appointment for one week)     Specialty:  Cardiology   Contact information:   [place] [address] [phone]       Follow up with Ardis Hughs, MD. (Call for an appointment this week)    Specialty:  Urology   Contact information:   [facility]  [place] [address] [phone]        Follow-up Information  Follow up with [name], MD On 06/28/2013. (Have a chest x-ray at [place] at 8:30, then see MD at 9:30)    Specialty:  Cardiothoracic Surgery   Contact information:   [address] [place] [address] [phone]       Follow up with [facility] NUR[facility] On 06/15/2013. (Dr. Everrett Coombe nurse will remove sutures at 10:15)       Follow up with Ida Rogue, MD. (Call for a follow up appointment for one week)    Specialty:  Cardiology   Contact information:   [place] [address] [phone]       Follow up with Ardis Hughs, MD. (Call for an appointment this week)    Specialty:  Urology   Contact information:   [facility]  [place] [address] [phone]        Nani Skillern PA-C 06/10/2013, 11:15 AM

## 2013-06-14 ENCOUNTER — Other Ambulatory Visit: Payer: Self-pay | Admitting: *Deleted

## 2013-06-14 DIAGNOSIS — C3490 Malignant neoplasm of unspecified part of unspecified bronchus or lung: Secondary | ICD-10-CM

## 2013-06-15 ENCOUNTER — Ambulatory Visit (INDEPENDENT_AMBULATORY_CARE_PROVIDER_SITE_OTHER): Payer: Medicare Other | Admitting: *Deleted

## 2013-06-15 ENCOUNTER — Ambulatory Visit: Payer: Self-pay

## 2013-06-15 DIAGNOSIS — Z4802 Encounter for removal of sutures: Secondary | ICD-10-CM

## 2013-06-15 DIAGNOSIS — C3400 Malignant neoplasm of unspecified main bronchus: Secondary | ICD-10-CM

## 2013-06-15 DIAGNOSIS — D381 Neoplasm of uncertain behavior of trachea, bronchus and lung: Secondary | ICD-10-CM

## 2013-06-15 NOTE — Progress Notes (Signed)
Jason Malone returns for suture removal of 2 previous chest tube sites.  There is a mini thoracotomy incision closed subq. with dermabond remaining.  All areas are healing well.  One of the chest tube sites is a little reddened but it is in the fold of his skin.  His appetite is returning.  He is using Dulcolax as necessary.  He is no longer requiring pain meds. He is seeing the urologist later today for management of his indwelling post op catheter.  He will return as scheduled with a chest xray.

## 2013-06-22 ENCOUNTER — Ambulatory Visit (INDEPENDENT_AMBULATORY_CARE_PROVIDER_SITE_OTHER): Payer: Medicare Other | Admitting: Cardiovascular Disease

## 2013-06-22 ENCOUNTER — Encounter: Payer: Self-pay | Admitting: Cardiovascular Disease

## 2013-06-22 VITALS — BP 112/68 | HR 54 | Ht 65.0 in | Wt 168.2 lb

## 2013-06-22 DIAGNOSIS — Z902 Acquired absence of lung [part of]: Secondary | ICD-10-CM

## 2013-06-22 DIAGNOSIS — I4891 Unspecified atrial fibrillation: Secondary | ICD-10-CM

## 2013-06-22 DIAGNOSIS — I4892 Unspecified atrial flutter: Secondary | ICD-10-CM

## 2013-06-22 DIAGNOSIS — I10 Essential (primary) hypertension: Secondary | ICD-10-CM

## 2013-06-22 DIAGNOSIS — R0789 Other chest pain: Secondary | ICD-10-CM

## 2013-06-22 DIAGNOSIS — Z9889 Other specified postprocedural states: Secondary | ICD-10-CM

## 2013-06-22 NOTE — Assessment & Plan Note (Signed)
Recovering well after her lobectomy. Encouraged him to continue on his physical therapy. No large pleural effusion noted on clinical exam.

## 2013-06-22 NOTE — Progress Notes (Signed)
Patient ID: Jason Malone, male    DOB: [birth year], 73 y.o.   MRN: [id number]  HPI Comments: Jason Malone is a 73 year old gentleman with long history of smoking,  lung cancer on the left, hypertension, hyperlipidemia who presented to the hospital 03/29/2070 with tachycardia, shortness of breath, atrial flutter. He was treated with rate controlling medications, and converted to normal sinus rhythm that night. He was discharged the next day. He did have significant electrolyte abnormality on arrival to the hospital with potassium 3.1, low magnesium felt secondary to his HCTZ. This was stopped in the hospital.   In followup, he reports that he  feels well. Denies any additional episodes of tachycardia. Recently completed second biopsy with diagnosis of carcinoid per the patient. He reports having a CT scan, PET scan, MRI He is recently seen Dr. Servando Snare for lobectomy for lung cancer.  He reports having side effects from a diltiazem/Cardizem with eye swelling, itching around his eyes. He would like to change this medication.  He has some shortness breath with exertion, also occasional chest tightness Blood pressure has also been running high at home typically in the 259 range systolic  Echocardiogram 56/38/7564 shows atrial flutter, ejection fraction 55-60%, otherwise normal study As mentioned, potassium on 03/29/2013 was 3.1 magnesium 1.6, cardiac enzymes negative, TSH 3.5  CT scan of the chest shows 4.6 x 4.1 x 4.2 cm left hilar mass encasing the left pulmonary artery branches Recent lab work showing total cholesterol 171, HDL 39, LDL 86 prior  stress test 05/22/2013 showing no ischemia  EKG today shows normal sinus rhythm with rate 54 beats per minute, no significant ST or T wave changes   Outpatient Encounter Prescriptions as of 06/22/2013  Medication Sig  . amiodarone (PACERONE) 200 MG tablet Take 1 tablet (200 mg total) by mouth 2 (two) times daily. For one week;then take Amiodarone 200 mg by  mouth daily thereafter  . b complex vitamins capsule Take 1 capsule by mouth daily.  . carvedilol (COREG) 6.25 MG tablet Take 1 tablet (6.25 mg total) by mouth 2 (two) times daily.  . cloNIDine (CATAPRES) 0.1 MG tablet Take 1 tablet (0.1 mg total) by mouth 2 (two) times daily.  [name] lisinopril (PRINIVIL,ZESTRIL) 10 MG tablet Take 10 mg by mouth daily.  . Multiple Vitamins-Minerals (MULTIVITAMIN WITH MINERALS) tablet Take 1 tablet by mouth daily.  [name] oxyCODONE-acetaminophen (PERCOCET/ROXICET) 5-325 MG per tablet Take 1 tablet by mouth every 4 (four) hours as needed for severe pain.  . polycarbophil (FIBERCON) 625 MG tablet Take 625 mg by mouth daily.  . simvastatin (ZOCOR) 40 MG tablet Take 40 mg by mouth every morning.  . tamsulosin (FLOMAX) 0.4 MG CAPS capsule Take 0.4 mg by mouth daily.   . vitamin C (ASCORBIC ACID) 500 MG tablet Take 500 mg by mouth daily.     Review of Systems  Constitutional: Negative.   HENT: Negative.   Eyes: Negative.   Respiratory: Positive for chest tightness and shortness of breath.   Cardiovascular: Negative.   Gastrointestinal: Negative.   Endocrine: Negative.   Musculoskeletal: Negative.   Skin: Negative.   Allergic/Immunologic: Negative.   Neurological: Negative.   Hematological: Negative.   Psychiatric/Behavioral: Negative.   All other systems reviewed and are negative.    BP 112/68  Pulse 54  Ht 5\' 5"  (1.651 m)  Wt 168 lb 4 oz (76.318 kg)  BMI 28.00 kg/m2  Physical Exam  Nursing note and vitals reviewed. Constitutional: He is oriented to person, place, and  time. He appears well-developed and well-nourished.  HENT:  Head: Normocephalic.  Nose: Nose normal.  Mouth/Throat: Oropharynx is clear and moist.  Eyes: Conjunctivae are normal. Pupils are equal, round, and reactive to light.  Neck: Normal range of motion. Neck supple. No JVD present.  Cardiovascular: Normal rate, regular rhythm, S1 normal, S2 normal, normal heart sounds and intact  distal pulses.  Exam reveals no gallop and no friction rub.   No murmur heard. Pulmonary/Chest: Effort normal. No respiratory distress. He has no rales. He exhibits no tenderness.  Abdominal: Soft. Bowel sounds are normal. He exhibits no distension. There is no tenderness.  Musculoskeletal: Normal range of motion. He exhibits no edema and no tenderness.  Lymphadenopathy:    He has no cervical adenopathy.  Neurological: He is alert and oriented to person, place, and time. Coordination normal.  Skin: Skin is warm and dry. No rash noted. No erythema.  Psychiatric: He has a normal mood and affect. His behavior is normal. Judgment and thought content normal.      Assessment and Plan

## 2013-06-22 NOTE — Assessment & Plan Note (Signed)
Maintaining normal sinus rhythm. Postop atrial fibrillation, normal sinus rhythm restored. Suggested he continue on amiodarone 200 mg daily until his left flank pain has significantly improved, then could hold the amiodarone

## 2013-06-22 NOTE — Patient Instructions (Signed)
You are doing well. No medication changes were made.  Please call us if you have new issues that need to be addressed before your next appt.  Your physician wants you to follow-up in: 6 months.  You will receive a reminder letter in the mail two months in advance. If you don't receive a letter, please call our office to schedule the follow-up appointment.   

## 2013-06-22 NOTE — Assessment & Plan Note (Signed)
Recent 10 pound weight loss. Blood pressure running on the low end. Encouraged him to watch his blood pressure at home. Could decrease his lisinopril dosing if blood pressure continues to run low

## 2013-06-26 ENCOUNTER — Ambulatory Visit: Payer: Self-pay | Admitting: General Surgery

## 2013-06-27 ENCOUNTER — Other Ambulatory Visit: Payer: Self-pay | Admitting: *Deleted

## 2013-06-27 DIAGNOSIS — C3490 Malignant neoplasm of unspecified part of unspecified bronchus or lung: Secondary | ICD-10-CM

## 2013-06-28 ENCOUNTER — Ambulatory Visit: Payer: Medicare Other | Admitting: Cardiothoracic Surgery

## 2013-06-29 ENCOUNTER — Telehealth: Payer: Self-pay | Admitting: *Deleted

## 2013-06-29 NOTE — Telephone Encounter (Signed)
Spoke w/ Dr. Rockey Situ and called pt w/ his recommendations.  Advised pt to hold lisinopril, monitor BP and call next week w/ readings. Pt verbalizes understanding and is agreeable to this.

## 2013-06-29 NOTE — Telephone Encounter (Signed)
Spoke w/ pt's wife.  She states that pt is weak, dizzy and pale.   Asked her to check pt's BP again, he reports that pt's BP now is 128/53, HR 55. She states that pt feels better, but that his BP is usually lower in the am and increases so that he feels better by the afternoon. She reports no med change at last ov, but was instructed to call if he didn't feel well. She would like to know if Dr. Rockey Situ recommends any changes in pt's regimen. Please advise.  Thank you.

## 2013-06-29 NOTE — Telephone Encounter (Signed)
Please call patient. He is very weak and pale and getting dizzy. Bp is 96/50 p 50 2:00p 06/29/12

## 2013-06-29 NOTE — Telephone Encounter (Signed)
Could hold the lisinopril over the weekend and monitor blood pressure

## 2013-07-02 ENCOUNTER — Ambulatory Visit
Admission: RE | Admit: 2013-07-02 | Discharge: 2013-07-02 | Disposition: A | Discharge status: Home / Self Care | Payer: Medicare Other | Source: Ambulatory Visit | Attending: Cardiothoracic Surgery | Admitting: Cardiothoracic Surgery

## 2013-07-02 ENCOUNTER — Ambulatory Visit (INDEPENDENT_AMBULATORY_CARE_PROVIDER_SITE_OTHER): Payer: Medicare Other | Admitting: Physician Assistant

## 2013-07-02 VITALS — BP 129/77 | HR 58 | Resp 20 | Ht 65.0 in | Wt 163.0 lb

## 2013-07-02 DIAGNOSIS — C3490 Malignant neoplasm of unspecified part of unspecified bronchus or lung: Secondary | ICD-10-CM

## 2013-07-02 DIAGNOSIS — Z902 Acquired absence of lung [part of]: Secondary | ICD-10-CM

## 2013-07-02 DIAGNOSIS — Z9889 Other specified postprocedural states: Secondary | ICD-10-CM

## 2013-07-02 NOTE — Progress Notes (Signed)
  HPI: Patient returns for routine postoperative follow-up having undergone Left Vats with Left Upper Lobectomy positive for Carcinoid tumor on 06/04/2013. The patient's early postoperative recovery while in the hospital was notable for Atrial Fibrillation and Urinary retention.  He was discharged home with foley catheter in place.  Since hospital discharge the patient reports that he is doing okay.  He followed up with Urologist and foley catheter has been successfully removed.  The patient has been suffering from low heart rate, hypotension, fatigue, and dizziness.  He contacted his Cardiologist who recommended he discontinue his Lisinopril.  His last dose of this medication was Friday, and his blood pressure has improved.  However, he continues to have fatigue, low heart rate, and fatigue.  He states overall he does not feel any better since the medication has been stopped.  He is ambulating with minimal difficulty.  His incisions are healing with no signs of infection.   Current Outpatient Prescriptions  Medication Sig Dispense Refill  . amiodarone (PACERONE) 200 MG tablet Take 1 tablet (200 mg total) by mouth 2 (two) times daily. For one week;then take Amiodarone 200 mg by mouth daily thereafter  30 tablet  1  . b complex vitamins capsule Take 1 capsule by mouth daily.      . carvedilol (COREG) 6.25 MG tablet Take 1 tablet (6.25 mg total) by mouth 2 (two) times daily.  60 tablet  3  . cloNIDine (CATAPRES) 0.1 MG tablet Take 1 tablet (0.1 mg total) by mouth 2 (two) times daily.  60 tablet  3  . Multiple Vitamins-Minerals (MULTIVITAMIN WITH MINERALS) tablet Take 1 tablet by mouth daily.      . polycarbophil (FIBERCON) 625 MG tablet Take 625 mg by mouth daily.      . simvastatin (ZOCOR) 40 MG tablet Take 40 mg by mouth every morning.      . tamsulosin (FLOMAX) 0.4 MG CAPS capsule Take 0.4 mg by mouth daily.       . vitamin C (ASCORBIC ACID) 500 MG tablet Take 500 mg by mouth daily.       No current  facility-administered medications for this visit.    Physical Exam:  BP 129/77  Pulse 58  Resp 20  Ht 5\' 5"  (1.651 m)  Wt 163 lb (73.936 kg)  BMI 27.12 kg/m2  SpO2 97%  Gen: no apparent distress Heart:RRR Lungs: CTA Bilaterally Incisions; clean and dry, some scab remains on incision and chest tube site  Diagnostic Tests:  CXR: looks great, no effusion present, normal post operative changes   1. S/P Left VATS with LUL Lobectomy-doing well 2. Bradycardia- previous A. Fib in hospital, however maintaining NSR HR is the 50s, will d/c Amiodarone which will hopefully relieve patient's symptoms 3. Hypotension-resolved, currently 129/77 today and patient instructed to continue and hold Lisinopril 4. RTC in 2 months with CXR

## 2013-07-04 ENCOUNTER — Telehealth: Payer: Self-pay | Admitting: *Deleted

## 2013-07-04 NOTE — Telephone Encounter (Signed)
Spoke w/ pt and advised him of Dr. Donivan Scull recommendations.  He is agreeable to this.

## 2013-07-04 NOTE — Telephone Encounter (Signed)
Dr. Servando Snare wants him to discontinue  Amiodarone because pulse was in the low in the 50's

## 2013-07-04 NOTE — Telephone Encounter (Signed)
Ok to stop amiodarone

## 2013-07-10 ENCOUNTER — Encounter (HOSPITAL_COMMUNITY): Payer: Self-pay | Admitting: Cardiothoracic Surgery

## 2013-07-10 NOTE — Addendum Note (Signed)
Addendum created 07/10/13 1323 by Josephine Igo, CRNA   Modules edited: Anesthesia Events

## 2013-07-15 ENCOUNTER — Telehealth: Payer: Self-pay | Admitting: Physician Assistant

## 2013-07-15 NOTE — Telephone Encounter (Signed)
The patient reports that he continues to feel weak as he had for the last two weeks.  Today his hands are "tingling".  HR runs in the upper 40's to 50's.  I asked him to decrease coreg to 3.125 twice daily.  He will follow up with Dr. Rockey Situ if no improvement.  Ki Luckman PA-C

## 2013-07-18 ENCOUNTER — Encounter: Payer: Self-pay | Admitting: Physician Assistant

## 2013-07-18 ENCOUNTER — Ambulatory Visit (INDEPENDENT_AMBULATORY_CARE_PROVIDER_SITE_OTHER): Payer: Medicare Other | Admitting: Physician Assistant

## 2013-07-18 ENCOUNTER — Telehealth: Payer: Self-pay

## 2013-07-18 VITALS — BP 163/82 | HR 55 | Ht 65.0 in | Wt 170.0 lb

## 2013-07-18 DIAGNOSIS — R0602 Shortness of breath: Secondary | ICD-10-CM

## 2013-07-18 DIAGNOSIS — I10 Essential (primary) hypertension: Secondary | ICD-10-CM

## 2013-07-18 DIAGNOSIS — I4892 Unspecified atrial flutter: Secondary | ICD-10-CM

## 2013-07-18 DIAGNOSIS — R001 Bradycardia, unspecified: Secondary | ICD-10-CM | POA: Insufficient documentation

## 2013-07-18 DIAGNOSIS — I498 Other specified cardiac arrhythmias: Secondary | ICD-10-CM

## 2013-07-18 DIAGNOSIS — I951 Orthostatic hypotension: Secondary | ICD-10-CM

## 2013-07-18 NOTE — Patient Instructions (Signed)
We will plan to wean off clonidine. Please take 0.1mg  (one tablet) for the next 4 days at noon, then stop. This should help with low heart rates and low blood pressure in the morning, and hence, improve your symptoms.  Please continue to monitor your symptoms (fatigue, weakness, lightheadedness), heart rate and blood pressure. Please call us next week once off the clonidine for an update.   We will restart low-dose lisinopril at 5mg  (1/2 tablet of the 10mg  tablets you have already) to keep BP less than 150/90 (new 2013 recommendations). Please monitor your BP and symptoms with this.   If low heart rates continues (in the 40-50s) we will have to stop carvedilol. Continue this for now for atrial fibrillation.   Please stay well-hydrated and supplement with small amounts of salt every 2-3 days (handful of pretzels).   If low blood pressures continue with position changes, I recommend wearing compression stockings throughout the day and we may need to consider a trial off Flomax as this medication is a common culprit for orthostatic hypotension (low blood pressures with position changes).

## 2013-07-18 NOTE — Telephone Encounter (Signed)
Pt sched to see Nicole Kindred, [place] today at 2:15.

## 2013-07-18 NOTE — Assessment & Plan Note (Signed)
HR 40-50s at home prior to down-titrating carvedilol. High 50s-low 60s in the office today. Would like to keep a small amount of BB on board with underling PAF/flutter; however, if bradycardia persists, will hold. Advised to monitor HR and symptoms with this.

## 2013-07-18 NOTE — Assessment & Plan Note (Signed)
Maintaining NSR currently. Continue low-dose carvedilol with close HR monitoring. CHA2DS2VASc = 2. Address risk-benefit of anticoagulation on follow-up.

## 2013-07-18 NOTE — Progress Notes (Signed)
Patient ID: Jason Malone, male   DOB: 28-Apr-1941, 73 y.o.   MRN: [id number]   Date:  07/18/2013   ID:  Jason Malone, DOB 1941-01-02, MRN [id number]  PCP:  Suzie Portela, FNP  Primary Cardiologist:  Johnny Bridge, MD   History of Present Illness:  Jason Malone is a 73 y.o. male w/ PMHx s/f PAF/flutter, HTN, HLD, left hilar mass s/p VATS/LUL lobectomy- positive carcinoid 05/2013- who presents for follow-up today.   He has recovered well from thoracic surgery described above. Post-op complications included urinary retention from bladder outlet obstruction (much improved on Flomax) and atrial fibrillation with RVR (maintaining SR, on carvedilol). CHA2DS2VASc = 2 (HTN, age > 19). Seen in follow-up by [facility] last month. He has had issues with hypotension, pallor, fatigue, weakness. Lisinopril held. HR noted to be in the 50s on [facility] follow-up. Amiodarone held. Symptoms have persisted. Coreg down-titrated to 3.125mg  BID. He remains on clonidine 0.1mg  PO BID.   Per telephone notes, BPs have trended low in the AM, then improves in the afternoon. Symptoms occur after taking Coreg and clonidine. He reports lightheadedness in the AM, particularly with position changes.  Orthostatic VS Lying 156/83; 55 bpm Sitting 138/79; 60 bpm Standing 126/76, 129/77, 127/78; 60, 61, 60 bpm  EKG: sinus bradycardia, 55 bpm, Q waves and TWIs III, aVF, ST scooping V4-V6 (unchanged from prior tracings)  Wt Readings from Last 3 Encounters:  07/02/13 73.936 kg (163 lb)  06/22/13 76.318 kg (168 lb 4 oz)  06/05/13 79 kg (174 lb 2.6 oz)     Past Medical History  Diagnosis Date  . Hypertension   . Hyperlipemia   . Full dentures   . Wears glasses   . Cancer   . Lung cancer   . Enlarged prostate   . A-fib   . Hypomagnesemia   . H/O hypokalemia   . Diverticulitis     Current Outpatient Prescriptions  Medication Sig Dispense Refill  . b complex vitamins capsule Take 1 capsule by mouth daily.      . carvedilol (COREG) 6.25  MG tablet Take 0.5 mg by mouth 2 (two) times daily.      . cloNIDine (CATAPRES) 0.1 MG tablet Take 1 tablet (0.1 mg total) by mouth 2 (two) times daily.  60 tablet  3  . Multiple Vitamins-Minerals (MULTIVITAMIN WITH MINERALS) tablet Take 1 tablet by mouth daily.      . polycarbophil (FIBERCON) 625 MG tablet Take 625 mg by mouth daily.      . simvastatin (ZOCOR) 40 MG tablet Take 40 mg by mouth every morning.      . tamsulosin (FLOMAX) 0.4 MG CAPS capsule Take 0.4 mg by mouth daily.       . vitamin C (ASCORBIC ACID) 500 MG tablet Take 500 mg by mouth daily.       No current facility-administered medications for this visit.    Allergies:    Allergies  Allergen Reactions  . Latex Itching  . Tape Itching    Surgical tapes     Social History:  The patient  reports that he quit smoking about 14 years ago. He has never used smokeless tobacco. He reports that he does not drink alcohol or use illicit drugs.   Family History:  Family History  Problem Relation Age of Onset  . Stroke Mother   . Hypertension Sister   . Hyperlipidemia Sister   . Hypertension Brother   . Hyperlipidemia Brother   . Cancer Sister  lung and colon    Review of Systems: General: positive for fatigue, weakness, negative for chills, fever, night sweats or weight changes.  Cardiovascular: positive for dyspnea on exertion, negative for chest pain, edema, orthopnea, palpitations, paroxysmal nocturnal dyspnea or shortness of breath Dermatological: negative for rash Respiratory:  negative for cough or wheezing Urologic:  negative for hematuria Abdominal:  negative for nausea, vomiting, diarrhea, bright red blood per rectum, melena, or hematemesis Neurologic: positive for lightheadedness, negative for visual changes, syncope All other systems reviewed and are otherwise negative except as noted above.  PHYSICAL EXAM: VS:  BP 163/82  Ht 5\' 5"  (1.651 m)  Wt 77.111 kg (170 lb)  BMI 28.29 kg/m2 Well nourished,  well developed, in no acute distress HEENT: normal, PERRL Neck: no JVD or bruits Cardiac:  normal S1, S2; RRR; no murmur or gallops Lungs:  clear to auscultation bilaterally, reduced lung sounds to LUL zone, no wheezing, rhonchi or rales Abd: soft, nontender, no hepatomegaly, normoactive BS x 4 quads Ext: no edema, cyanosis or clubbing Skin: warm and dry, cap refill < 2 sec Neuro:  CNs 2-12 intact, no focal abnormalities noted Musculoskeletal: strength and tone appropriate for age  Psych: normal affect

## 2013-07-18 NOTE — Assessment & Plan Note (Signed)
Will restart low-dose lisinopril to account for lack of clonidine. Provided good BP control in the past and minimal effect on HR. Goal BP <150/90 per new guidelines. Wife states SBP runs in the 140s. Was concerned initially and provided reassurance. Would be beneficial to run BP on the higher side given orthostasis. He will monitor BP and call with an update next week.

## 2013-07-18 NOTE — Assessment & Plan Note (Signed)
Evident in the office today and contributing to symptoms. The patient is on two alpha blockers. Although Flomax may potentiate orthostasis, the patient is hesitant to discontinue this given improved urinary function since starting ~ 1 month ago. Will taper off clonidine with concomitant bradycardia. Advised to stay well-hydrated and supplement intermittently with small amounts of salty foods. Educated on compression stockings. Monitor BP, HR and orthostatic symptoms with this. If positional hypotension persists, consider trial off Flomax.

## 2013-07-18 NOTE — Telephone Encounter (Signed)
CMA with DR Jeananne Rama wants pt seen today or tomorrow due to side effects from Coreg and clonidine / side effects are weakness, fatigue, lethargic, worse after taking medications. Please call./

## 2013-07-26 ENCOUNTER — Telehealth: Payer: Self-pay

## 2013-07-26 NOTE — Telephone Encounter (Signed)
BP readings are not significantly elevated. We want to run him on the higher side. Please reassure. His goal is < 150/90. If he has a few readings above this every once in a while, that is okay. Advise to increase lisinopril back to 10mg  daily.

## 2013-07-26 NOTE — Telephone Encounter (Signed)
Spoke w/ pt and advised him of Tony's recommendation. Pt verbalizes understanding and will call w/ further questions or concerns.

## 2013-07-26 NOTE — Telephone Encounter (Signed)
Pt called and states his BP is not coming down. Please call.

## 2013-07-26 NOTE — Telephone Encounter (Signed)
Pt called w/ following BP readings:  Yesterday:  4:40pm  158/78 Today: 7:30am   151/80 8:30am  159/89 9:45am  161/85 10:10am 146/85 1:38opm  152/82  Pt is very anxious and reports that he has increased his lisinopril up to 15mg  and is not taking any clonidine. Reports that he feels okay,but is very concerned that numbers have not come down. Advised pt not to check his BP so often, as he states that this increases his anxiety, but he states that he was told to monitor it and he is following directions.  He would like to know what to do next.  Please advise.  Thank you.

## 2013-08-27 ENCOUNTER — Other Ambulatory Visit: Payer: Self-pay | Admitting: Cardiothoracic Surgery

## 2013-08-27 DIAGNOSIS — C3490 Malignant neoplasm of unspecified part of unspecified bronchus or lung: Secondary | ICD-10-CM

## 2013-08-30 ENCOUNTER — Encounter: Payer: Self-pay | Admitting: Cardiothoracic Surgery

## 2013-08-30 ENCOUNTER — Encounter (INDEPENDENT_AMBULATORY_CARE_PROVIDER_SITE_OTHER): Payer: Self-pay

## 2013-08-30 ENCOUNTER — Ambulatory Visit
Admission: RE | Admit: 2013-08-30 | Discharge: 2013-08-30 | Disposition: A | Discharge status: Home / Self Care | Payer: Medicare Other | Source: Ambulatory Visit | Attending: Cardiothoracic Surgery | Admitting: Cardiothoracic Surgery

## 2013-08-30 ENCOUNTER — Ambulatory Visit (INDEPENDENT_AMBULATORY_CARE_PROVIDER_SITE_OTHER): Payer: Medicare Other | Admitting: Cardiothoracic Surgery

## 2013-08-30 VITALS — BP 145/79 | HR 74 | Resp 18 | Ht 65.0 in | Wt 169.0 lb

## 2013-08-30 DIAGNOSIS — C3490 Malignant neoplasm of unspecified part of unspecified bronchus or lung: Secondary | ICD-10-CM

## 2013-08-30 DIAGNOSIS — Z902 Acquired absence of lung [part of]: Secondary | ICD-10-CM

## 2013-08-30 DIAGNOSIS — Z9889 Other specified postprocedural states: Secondary | ICD-10-CM

## 2013-08-30 DIAGNOSIS — C3410 Malignant neoplasm of upper lobe, unspecified bronchus or lung: Secondary | ICD-10-CM

## 2013-08-30 NOTE — Progress Notes (Signed)
[address]       [place] [address]             [phone]      Amaziah Matty [facility] Medical Record #[id number] Date of Birth: 1941-02-28  Referring: Golden Pop, MD Primary Care: Suzie Portela, FNP  Chief Complaint:   POST OP FOLLOW UP 06/04/2013  OPERATIVE REPORT  PREOPERATIVE DIAGNOSIS: Large left hilar carcinoid tumor.  POSTOPERATIVE DIAGNOSIS: Large left hilar carcinoid tumor.  SURGICAL PROCEDURE: Bronchoscopy, left video-assisted thoracoscopy,  minithoracotomy, left upper lobectomy with lymph node dissection, re-  resection of the bronchial stump, and placement of On-Q device.  Mediastinal lymph node dissection.  SURGEON: Lanelle Bal, MD  Lung cancer   Primary site: Lung (Left)   Staging method: AJCC 7th Edition   Pathologic free text: well differentiated Neuroendocrine Tumor (carcinoid)   Pathologic: Stage IB (T2a, N0, cM0) signed by [name], MD on 06/06/2013  9:21 AM   Summary: Stage IB (T2a, N0, cM0)  History of Present Illness:     Patient's doing well postoperatively. He is not limited by shortness of breath. He has occasional sharp pains in his left chest wall with movement. Early postoperatively he had difficulty voiding, he now notes that he has no difficulty with urinary retention on Flomax every other day.  Zubrod Score: At the time of surgery this patient's most appropriate activity status/level should be described as: [x]     0    Normal activity, no symptoms []     1    Restricted in physical strenuous activity but ambulatory, able to do out light work []     2    Ambulatory and capable of self care, unable to do work activities, up and about >50 % of waking hours                              []     3    Only limited self care, in bed greater than 50% of waking hours []     4    Completely disabled, no self care, confined to bed or chair []     5    Moribund   Past Medical History  Diagnosis Date  . Hypertension    . Hyperlipemia   . Full dentures   . Wears glasses   . Enlarged prostate   . A-fib   . Hypomagnesemia   . H/O hypokalemia   . Diverticulitis   . Lung cancer     carcinoid     History  Smoking status  . Former Smoker  . Quit date: 12/09/1998  Smokeless tobacco  . Never Used    History  Alcohol Use No     Allergies  Allergen Reactions  . Latex Itching  . Tape Itching    Surgical tapes     Current Outpatient Prescriptions  Medication Sig Dispense Refill  . b complex vitamins capsule Take 1 capsule by mouth daily.      . carvedilol (COREG) 6.25 MG tablet Take 0.5 mg by mouth 2 (two) times daily.      [name] lisinopril (PRINIVIL,ZESTRIL) 10 MG tablet Take 10 mg by mouth 2 (two) times daily.      . Multiple Vitamins-Minerals (MULTIVITAMIN WITH MINERALS) tablet Take 1 tablet by mouth daily.      . polycarbophil (FIBERCON) 625 MG tablet Take 625 mg by mouth daily.      [name]  simvastatin (ZOCOR) 40 MG tablet Take 40 mg by mouth every morning.      . tamsulosin (FLOMAX) 0.4 MG CAPS capsule Take 0.4 mg by mouth daily.       . vitamin C (ASCORBIC ACID) 500 MG tablet Take 500 mg by mouth daily.       No current facility-administered medications for this visit.       Physical Exam: BP 145/79  Pulse 74  Resp 18  Ht 5\' 5"  (1.651 m)  Wt 169 lb (76.658 kg)  BMI 28.12 kg/m2  SpO2 97%  General appearance: alert and cooperative Neurologic: intact Heart: regular rate and rhythm, S1, S2 normal, no murmur, click, rub or gallop Lungs: clear to auscultation bilaterally Abdomen: soft, non-tender; bowel sounds normal; no masses,  no organomegaly Extremities: extremities normal, atraumatic, no cyanosis or edema and Homans sign is negative, no sign of DVT Wound: Patient's port sites and incision are well-healed He has no cervical or supraclavicular adenopathy  Diagnostic Studies & Laboratory data:     Recent Radiology Findings:   Dg Chest 2 View  08/30/2013   CLINICAL DATA:  Lung  cancer follow-up  EXAM: CHEST  2 VIEW  COMPARISON:  DG CHEST 2 VIEW dated 07/02/2013; CT BIOPSY dated 04/25/2013  FINDINGS: The heart size and mediastinal contours are within normal limits. Stable calcified right upper lobe pulmonary nodule. Left lung volume loss from prior lobectomy. Both lungs are clear. The visualized skeletal structures are unremarkable.  IMPRESSION: No active cardiopulmonary disease.   Electronically Signed   By: Kathreen Devoid   On: 08/30/2013 13:33      Recent Lab Findings: Lab Results  Component Value Date   WBC 14.4* 06/08/2013   HGB 12.0* 06/08/2013   HCT 35.3* 06/08/2013   PLT 149* 06/08/2013   GLUCOSE 111* 06/10/2013   ALT 18 06/06/2013   AST 29 06/06/2013   NA 143 06/10/2013   K 4.1 06/10/2013   CL 105 06/10/2013   CREATININE 1.04 06/10/2013   BUN 13 06/10/2013   CO2 28 06/10/2013   TSH 4.221 06/08/2013   INR 0.99 05/31/2013      Assessment / Plan:     Well differentiated neuroendocrine tumor/ carcinoid stage IB patient is doing well postoperatively, after presentation to the multidisciplinary thoracic oncology conference no further treatment other than observation was recommended. The patient is now doing well postoperatively. He's returning to near-normal activities. We'll plan to see him back in 3 months with a followup CT scan of the chest which will be approximate 6 months postop.   [name] MD      [address] [place] [address] Office [phone]   Beeper [phone]  08/30/2013 2:06 PM

## 2013-10-19 ENCOUNTER — Other Ambulatory Visit: Payer: Self-pay | Admitting: *Deleted

## 2013-10-19 DIAGNOSIS — C3490 Malignant neoplasm of unspecified part of unspecified bronchus or lung: Secondary | ICD-10-CM

## 2013-10-29 ENCOUNTER — Other Ambulatory Visit: Payer: Self-pay

## 2013-10-29 DIAGNOSIS — D381 Neoplasm of uncertain behavior of trachea, bronchus and lung: Secondary | ICD-10-CM

## 2013-11-20 ENCOUNTER — Ambulatory Visit
Admission: RE | Admit: 2013-11-20 | Discharge: 2013-11-20 | Disposition: A | Discharge status: Home / Self Care | Payer: Medicare Other | Source: Ambulatory Visit | Attending: Cardiothoracic Surgery | Admitting: Cardiothoracic Surgery

## 2013-11-20 ENCOUNTER — Encounter: Payer: Self-pay | Admitting: Cardiothoracic Surgery

## 2013-11-20 ENCOUNTER — Ambulatory Visit (INDEPENDENT_AMBULATORY_CARE_PROVIDER_SITE_OTHER): Payer: Medicare Other | Admitting: Cardiothoracic Surgery

## 2013-11-20 VITALS — BP 112/68 | HR 68 | Resp 20 | Ht 65.0 in | Wt 169.0 lb

## 2013-11-20 DIAGNOSIS — C3410 Malignant neoplasm of upper lobe, unspecified bronchus or lung: Secondary | ICD-10-CM

## 2013-11-20 DIAGNOSIS — C3490 Malignant neoplasm of unspecified part of unspecified bronchus or lung: Secondary | ICD-10-CM

## 2013-11-20 NOTE — Progress Notes (Signed)
[address]       [place] [address]             [phone]      Jason Malone [facility] Medical Record #[id number] Date of Birth: [birth year]  Referring: Nestor Lewandowsky, MD Primary Care: Arnetha Courser, MD  Chief Complaint:   POST OP FOLLOW UP 06/04/2013  OPERATIVE REPORT  PREOPERATIVE DIAGNOSIS: Large left hilar carcinoid tumor.  POSTOPERATIVE DIAGNOSIS: Large left hilar carcinoid tumor.  SURGICAL PROCEDURE: Bronchoscopy, left video-assisted thoracoscopy,  minithoracotomy, left upper lobectomy with lymph node dissection, re-  resection of the bronchial stump, and placement of On-Q device.  Mediastinal lymph node dissection.  SURGEON: Lanelle Bal, MD  Lung cancer   Primary site: Lung (Left)   Staging method: AJCC 7th Edition   Pathologic free text: well differentiated Neuroendocrine Tumor (carcinoid)   Pathologic: Stage IB (T2a, N0, cM0) signed by [name], MD on 06/06/2013  9:21 AM   Summary: Stage IB (T2a, N0, cM0)  History of Present Illness:     Patient's doing well postoperatively. He is not limited by shortness of breath. He has occasional sharp pains in his left chest wall with movement. Early postoperatively he had difficulty voiding, he now notes that he has no difficulty with urinary retention on Flomax every other day. He has had no hemoptysis.   Zubrod Score: At the time of surgery this patient's most appropriate activity status/level should be described as: [x]     0    Normal activity, no symptoms []     1    Restricted in physical strenuous activity but ambulatory, able to do out light work []     2    Ambulatory and capable of self care, unable to do work activities, up and about >50 % of waking hours                              []     3    Only limited self care, in bed greater than 50% of waking hours []     4    Completely disabled, no self care, confined to bed or chair []     5    Moribund   Past Medical History    Diagnosis Date  . Hypertension   . Hyperlipemia   . Full dentures   . Wears glasses   . Enlarged prostate   . A-fib   . Hypomagnesemia   . H/O hypokalemia   . Diverticulitis   . Lung cancer     carcinoid     History  Smoking status  . Former Smoker  . Quit date: 12/09/1998  Smokeless tobacco  . Never Used    History  Alcohol Use No     Allergies  Allergen Reactions  . Latex Itching  . Tape Itching    Surgical tapes     Current Outpatient Prescriptions  Medication Sig Dispense Refill  . b complex vitamins capsule Take 1 capsule by mouth daily.      . carvedilol (COREG) 6.25 MG tablet Take 0.5 mg by mouth 2 (two) times daily.      . finasteride (PROSCAR) 5 MG tablet Take 5 mg by mouth daily.       [name] lisinopril (PRINIVIL,ZESTRIL) 10 MG tablet Take 10 mg by mouth 2 (two) times daily.      . Multiple Vitamins-Minerals (MULTIVITAMIN WITH MINERALS) tablet Take  1 tablet by mouth daily.      . polycarbophil (FIBERCON) 625 MG tablet Take 625 mg by mouth daily.      . simvastatin (ZOCOR) 40 MG tablet Take 40 mg by mouth every morning.      . tamsulosin (FLOMAX) 0.4 MG CAPS capsule Take 0.4 mg by mouth daily.       . vitamin C (ASCORBIC ACID) 500 MG tablet Take 500 mg by mouth daily.       No current facility-administered medications for this visit.       Physical Exam: BP 112/68  Pulse 68  Resp 20  Ht 5\' 5"  (1.651 m)  Wt 169 lb (76.658 kg)  BMI 28.12 kg/m2  SpO2 92%  General appearance: alert and cooperative Neurologic: intact Heart: regular rate and rhythm, S1, S2 normal, no murmur, click, rub or gallop Lungs: clear to auscultation bilaterally Abdomen: soft, non-tender; bowel sounds normal; no masses,  no organomegaly Extremities: extremities normal, atraumatic, no cyanosis or edema and Homans sign is negative, no sign of DVT Wound: Patient's port sites and incision are well-healed He has no cervical or supraclavicular adenopathy  Diagnostic Studies &  Laboratory data:     Recent Radiology Findings:   Ct Chest Wo Contrast  11/20/2013   CLINICAL DATA:  Cough with shortness breath and left chest pain. History of colon cancer and left thoracotomy 6 months ago.  EXAM: CT CHEST WITHOUT CONTRAST  TECHNIQUE: Multidetector CT imaging of the chest was performed following the standard protocol without IV contrast.  COMPARISON:  Radiographs 08/30/2013. PET-CT 04/17/2013 and chest CT 03/28/2013.  FINDINGS: Patient has undergone left upper lobe resection and resection of the left suprahilar mass. There is distortion of the left hilum without obvious residual mass on the axial images. On the coronal images, there is soft tissue density projecting into the superior aspect of the distal left mainstem bronchus (coronal images 74 and 75). There are no peripheral pulmonary nodules. There is mild scarring at the left lung base. There is stable calcified granuloma in the right upper lobe.  No enlarged mediastinal, hilar or axillary lymph nodes are identified. Hilar assessment is limited without contrast. The heart size is normal. There is atherosclerosis of the aorta, great vessels and coronary arteries.  There is no pleural or pericardial effusion.  Images through the upper abdomen demonstrate cholelithiasis and hepatic steatosis. There is no adrenal mass. Thoracotomy changes are present on the left. There are no worrisome osseous.  IMPRESSION: 1. Interval left upper lobe resection for lung cancer. 2. No evidence of metastatic disease. 3. There is distortion of the left hilum with soft tissue density projecting into the superior aspect of the left mainstem bronchus. This may represent a postsurgical finding, possibly volume averaging with adjacent vessels. On subsequent follow, contrast administration may be helpful. 4. Cholelithiasis.   Electronically Signed   By: Camie Patience M.D.   On: 11/20/2013 12:13      Recent Lab Findings: Lab Results  Component Value Date   WBC  14.4* 06/08/2013   HGB 12.0* 06/08/2013   HCT 35.3* 06/08/2013   PLT 149* 06/08/2013   GLUCOSE 111* 06/10/2013   ALT 18 06/06/2013   AST 29 06/06/2013   NA 143 06/10/2013   K 4.1 06/10/2013   CL 105 06/10/2013   CREATININE 1.04 06/10/2013   BUN 13 06/10/2013   CO2 28 06/10/2013   TSH 4.221 06/08/2013   INR 0.99 05/31/2013      Assessment / Plan:  Well differentiated neuroendocrine tumor/ carcinoid stage IB patient is doing well postoperatively, after presentation to the multidisciplinary thoracic oncology conference no further treatment other than observation was recommended. The patient is now doing well postoperatively. He's returning to near-normal activities. We'll plan to see him back in 6 months with a followup CT scan of the chest which will be approximate 6 months postop. Ct scan  Dx of gallstones- no symptoms  [name] MD      [address] ,[place] [address] Office [phone]   Beeper [phone]  11/20/2013 2:21 PM

## 2013-12-13 ENCOUNTER — Ambulatory Visit: Payer: Medicare Other | Admitting: Cardiothoracic Surgery

## 2013-12-13 ENCOUNTER — Other Ambulatory Visit: Payer: Medicare Other

## 2013-12-18 ENCOUNTER — Other Ambulatory Visit: Payer: Self-pay | Admitting: *Deleted

## 2013-12-18 MED ORDER — CARVEDILOL 3.125 MG PO TABS: 3.1250 mg | ORAL_TABLET | Freq: Two times a day (BID) | ORAL | Status: DC

## 2013-12-18 NOTE — Telephone Encounter (Signed)
90 day refill.

## 2013-12-21 ENCOUNTER — Other Ambulatory Visit: Payer: Self-pay | Admitting: *Deleted

## 2013-12-21 MED ORDER — CARVEDILOL 3.125 MG PO TABS
3.1250 mg | ORAL_TABLET | Freq: Two times a day (BID) | ORAL | Status: DC
Start: 2013-12-21 — End: 2015-08-20

## 2013-12-21 NOTE — Telephone Encounter (Signed)
Requested Prescriptions   Signed Prescriptions Disp Refills  . carvedilol (COREG) 3.125 MG tablet 180 tablet 3    Sig: Take 1 tablet (3.125 mg total) by mouth 2 (two) times daily.    Authorizing Provider: Minna Merritts    Ordering User: Britt Bottom

## 2014-01-29 ENCOUNTER — Other Ambulatory Visit: Payer: Self-pay | Admitting: Physician Assistant

## 2014-01-30 ENCOUNTER — Encounter: Payer: Self-pay | Admitting: General Surgery

## 2014-02-13 ENCOUNTER — Ambulatory Visit (INDEPENDENT_AMBULATORY_CARE_PROVIDER_SITE_OTHER): Payer: Medicare Other | Admitting: Cardiovascular Disease

## 2014-02-13 ENCOUNTER — Encounter: Payer: Self-pay | Admitting: Cardiovascular Disease

## 2014-02-13 VITALS — BP 130/80 | HR 55 | Ht 65.0 in | Wt 169.5 lb

## 2014-02-13 DIAGNOSIS — E785 Hyperlipidemia, unspecified: Secondary | ICD-10-CM

## 2014-02-13 DIAGNOSIS — I483 Typical atrial flutter: Secondary | ICD-10-CM

## 2014-02-13 DIAGNOSIS — C3492 Malignant neoplasm of unspecified part of left bronchus or lung: Secondary | ICD-10-CM

## 2014-02-13 DIAGNOSIS — I10 Essential (primary) hypertension: Secondary | ICD-10-CM

## 2014-02-13 DIAGNOSIS — I951 Orthostatic hypotension: Secondary | ICD-10-CM

## 2014-02-13 DIAGNOSIS — R0789 Other chest pain: Secondary | ICD-10-CM

## 2014-02-13 MED ORDER — LOSARTAN POTASSIUM 100 MG PO TABS: 100.0000 mg | ORAL_TABLET | Freq: Every day | ORAL | Status: DC

## 2014-02-13 NOTE — Assessment & Plan Note (Signed)
Status post resection, some residual nerve issues/numbness

## 2014-02-13 NOTE — Assessment & Plan Note (Signed)
Blood pressure is well controlled on today's visit. No changes made to the medications. 

## 2014-02-13 NOTE — Assessment & Plan Note (Signed)
Blood pressure stable. No symptoms of orthostasis or dizziness

## 2014-02-13 NOTE — Assessment & Plan Note (Signed)
No recent lipid panel available 

## 2014-02-13 NOTE — Assessment & Plan Note (Signed)
No further arrhythmia noted. No medication changes made

## 2014-02-13 NOTE — Progress Notes (Signed)
Patient ID: Jason Malone, male    DOB: 05/03/1941, 73 y.o.   MRN: [id number]  HPI Comments: Jason Malone is a 73 year-old gentleman with long history of smoking,  lung cancer on the left, status post resection January 2015, hypertension, hyperlipidemia who presented to the hospital 03/29/2013 with tachycardia, shortness of breath, atrial flutter. He was treated with rate controlling medications, and converted to normal sinus rhythm that night.significant electrolyte abnormality on arrival to the hospital with potassium 3.1, low magnesium felt secondary to his HCTZ. This was stopped.  In followup today, he reports that he has a chronic cough. He has had this for quite some time, likely even prior to his lung surgery. Wife has become used to it. Seems to happen in the daytime and at night.   Hemoglobin A1c 6.2  Prior surgery performed by Dr. Servando Snare  Blood pressure has also been running high at home typically in the 120 to 130 range range systolic Wife is trying to change his diet for weight loss  Echocardiogram 03/29/2013 shows atrial flutter, ejection fraction 55-60%, otherwise normal study As mentioned, potassium on 03/29/2013 was 3.1 magnesium 1.6, cardiac enzymes negative, TSH 3.5  CT scan of the chest shows 4.6 x 4.1 x 4.2 cm left hilar mass encasing the left pulmonary artery branches Recent lab work showing total cholesterol 171, HDL 39, LDL 86 prior  stress test 05/22/2013 showing no ischemia  EKG today shows normal sinus rhythm with rate 55 beats per minute, no significant ST or T wave changes   Outpatient Encounter Prescriptions as of 02/13/2014  Medication Sig  . b complex vitamins capsule Take 1 capsule by mouth daily.  . carvedilol (COREG) 3.125 MG tablet Take 1 tablet (3.125 mg total) by mouth 2 (two) times daily.  . finasteride (PROSCAR) 5 MG tablet Take 5 mg by mouth daily.   . fluticasone (FLONASE) 50 MCG/ACT nasal spray Place 1 spray into both nostrils daily.   [name] lisinopril  (PRINIVIL,ZESTRIL) 10 MG tablet Take 10 mg by mouth 2 (two) times daily.  . Multiple Vitamins-Minerals (MULTIVITAMIN WITH MINERALS) tablet Take 1 tablet by mouth daily.  . polycarbophil (FIBERCON) 625 MG tablet Take 625 mg by mouth daily.  . simvastatin (ZOCOR) 40 MG tablet Take 40 mg by mouth every morning.  . tamsulosin (FLOMAX) 0.4 MG CAPS capsule Take 0.4 mg by mouth daily.   . vitamin C (ASCORBIC ACID) 500 MG tablet Take 500 mg by mouth daily.     Review of Systems  Constitutional: Negative.   HENT: Negative.   Eyes: Negative.   Respiratory: Negative.   Cardiovascular: Negative.   Gastrointestinal: Negative.   Endocrine: Negative.   Musculoskeletal: Negative.   Skin: Negative.   Allergic/Immunologic: Negative.   Neurological: Negative.   Hematological: Negative.   Psychiatric/Behavioral: Negative.   All other systems reviewed and are negative.   BP 130/80  Pulse 55  Ht 5\' 5"  (1.651 m)  Wt 169 lb 8 oz (76.885 kg)  BMI 28.21 kg/m2  Physical Exam  Nursing note and vitals reviewed. Constitutional: He is oriented to person, place, and time. He appears well-developed and well-nourished.  HENT:  Head: Normocephalic.  Nose: Nose normal.  Mouth/Throat: Oropharynx is clear and moist.  Eyes: Conjunctivae are normal. Pupils are equal, round, and reactive to light.  Neck: Normal range of motion. Neck supple. No JVD present.  Cardiovascular: Normal rate, regular rhythm, S1 normal, S2 normal, normal heart sounds and intact distal pulses.  Exam reveals no  gallop and no friction rub.   No murmur heard. Pulmonary/Chest: Effort normal. No respiratory distress. He has no rales. He exhibits no tenderness.  Abdominal: Soft. Bowel sounds are normal. He exhibits no distension. There is no tenderness.  Musculoskeletal: Normal range of motion. He exhibits no edema and no tenderness.  Lymphadenopathy:    He has no cervical adenopathy.  Neurological: He is alert and oriented to person,  place, and time. Coordination normal.  Skin: Skin is warm and dry. No rash noted. No erythema.  Psychiatric: He has a normal mood and affect. His behavior is normal. Judgment and thought content normal.      Assessment and Plan

## 2014-02-13 NOTE — Patient Instructions (Signed)
You are doing well. Please hold the lisinopril Start losartan 1/2 pill once daily  Monitor your blood pressure Goal blood pressure <140/<90  If it runs high, increase to a full pill once a day  Please call us if you have new issues that need to be addressed before your next appt.  Your physician wants you to follow-up in: 12 months.  You will receive a reminder letter in the mail two months in advance. If you don't receive a letter, please call our office to schedule the follow-up appointment.

## 2014-02-14 ENCOUNTER — Ambulatory Visit: Payer: Self-pay | Admitting: General Surgery

## 2014-04-16 ENCOUNTER — Other Ambulatory Visit: Payer: Self-pay | Admitting: *Deleted

## 2014-04-16 DIAGNOSIS — C3492 Malignant neoplasm of unspecified part of left bronchus or lung: Secondary | ICD-10-CM

## 2014-05-23 ENCOUNTER — Other Ambulatory Visit: Payer: Medicare Other

## 2014-05-23 ENCOUNTER — Encounter: Payer: Medicare Other | Admitting: Cardiothoracic Surgery

## 2014-06-04 ENCOUNTER — Ambulatory Visit (INDEPENDENT_AMBULATORY_CARE_PROVIDER_SITE_OTHER): Payer: Medicare Other | Admitting: Cardiothoracic Surgery

## 2014-06-04 ENCOUNTER — Ambulatory Visit
Admission: RE | Admit: 2014-06-04 | Discharge: 2014-06-04 | Disposition: A | Discharge status: Home / Self Care | Payer: Medicare Other | Source: Ambulatory Visit | Attending: Cardiothoracic Surgery | Admitting: Cardiothoracic Surgery

## 2014-06-04 ENCOUNTER — Encounter: Payer: Self-pay | Admitting: Cardiothoracic Surgery

## 2014-06-04 VITALS — BP 127/74 | HR 65 | Resp 18 | Ht 65.0 in | Wt 163.0 lb

## 2014-06-04 DIAGNOSIS — C3402 Malignant neoplasm of left main bronchus: Secondary | ICD-10-CM | POA: Diagnosis not present

## 2014-06-04 DIAGNOSIS — Z902 Acquired absence of lung [part of]: Secondary | ICD-10-CM

## 2014-06-04 DIAGNOSIS — Z9889 Other specified postprocedural states: Secondary | ICD-10-CM

## 2014-06-04 DIAGNOSIS — R079 Chest pain, unspecified: Secondary | ICD-10-CM | POA: Diagnosis not present

## 2014-06-04 DIAGNOSIS — K8020 Calculus of gallbladder without cholecystitis without obstruction: Secondary | ICD-10-CM | POA: Diagnosis not present

## 2014-06-04 DIAGNOSIS — C3490 Malignant neoplasm of unspecified part of unspecified bronchus or lung: Secondary | ICD-10-CM | POA: Diagnosis not present

## 2014-06-04 DIAGNOSIS — D381 Neoplasm of uncertain behavior of trachea, bronchus and lung: Secondary | ICD-10-CM

## 2014-06-04 NOTE — Progress Notes (Signed)
[address]       ,[place] [address]             [phone]      Corbet W Bobrowski [facility] Medical Record #[id number] Date of Birth: Oct 24, 1940  Referring: Nestor Lewandowsky, MD Primary Care: LADA, Satira Anis, MD  Chief Complaint:   POST OP FOLLOW UP 06/04/2013  OPERATIVE REPORT  PREOPERATIVE DIAGNOSIS: Large left hilar carcinoid tumor.  POSTOPERATIVE DIAGNOSIS: Large left hilar carcinoid tumor.  SURGICAL PROCEDURE: Bronchoscopy, left video-assisted thoracoscopy,  minithoracotomy, left upper lobectomy with lymph node dissection, re-  resection of the bronchial stump, and placement of On-Q device.  Mediastinal lymph node dissection.  SURGEON: Lanelle Bal, MD  Lung cancer   Primary site: Lung (Left)   Staging method: AJCC 7th Edition   Pathologic free text: well differentiated Neuroendocrine Tumor (carcinoid)   Pathologic: Stage IB (T2a, N0, cM0) signed by [name], MD on 06/06/2013  9:21 AM   Summary: Stage IB (T2a, N0, cM0)  History of Present Illness:     Patient's doing well postoperatively. He is not limited by shortness of breath. He has occasional sharp pains in his left chest wall with movement. He notes some cough intermittently if he lies on his right side. No hemoptysis. He's had no further problems with passing urine as he did in the early postoperative period.   Zubrod Score: At the time of surgery this patient's most appropriate activity status/level should be described as: [x]     0    Normal activity, no symptoms []     1    Restricted in physical strenuous activity but ambulatory, able to do out light work []     2    Ambulatory and capable of self care, unable to do work activities, up and about >50 % of waking hours                              []     3    Only limited self care, in bed greater than 50% of waking hours []     4    Completely disabled, no self care, confined to bed or chair []     5    Moribund   Past Medical History   Diagnosis Date  . Hypertension   . Hyperlipemia   . Full dentures   . Wears glasses   . Enlarged prostate   . A-fib   . Hypomagnesemia   . H/O hypokalemia   . Diverticulitis   . Lung cancer     carcinoid     History  Smoking status  . Former Smoker  . Quit date: 12/09/1998  Smokeless tobacco  . Never Used    History  Alcohol Use No     Allergies  Allergen Reactions  . Latex Itching  . Tape Itching    Surgical tapes     Current Outpatient Prescriptions  Medication Sig Dispense Refill  . b complex vitamins capsule Take 1 capsule by mouth daily.    . carvedilol (COREG) 3.125 MG tablet Take 1 tablet (3.125 mg total) by mouth 2 (two) times daily. 180 tablet 3  . finasteride (PROSCAR) 5 MG tablet Take 5 mg by mouth daily.     . fluticasone (FLONASE) 50 MCG/ACT nasal spray Place 1 spray into both nostrils daily.     [name] losartan (COZAAR) 100 MG tablet Take 1 tablet (100  mg total) by mouth daily. (Patient taking differently: Take 50 mg by mouth daily. ) 30 tablet 6  . Multiple Vitamins-Minerals (MULTIVITAMIN WITH MINERALS) tablet Take 1 tablet by mouth daily.    . polycarbophil (FIBERCON) 625 MG tablet Take 625 mg by mouth daily.    . simvastatin (ZOCOR) 40 MG tablet Take 40 mg by mouth every morning.    . tamsulosin (FLOMAX) 0.4 MG CAPS capsule Take 0.4 mg by mouth daily.     . vitamin C (ASCORBIC ACID) 500 MG tablet Take 500 mg by mouth daily.     No current facility-administered medications for this visit.       Physical Exam: BP 127/74 mmHg  Pulse 65  Resp 18  Ht 5\' 5"  (1.651 m)  Wt 163 lb (73.936 kg)  BMI 27.12 kg/m2  SpO2 97%  General appearance: alert and cooperative Neurologic: intact Heart: regular rate and rhythm, S1, S2 normal, no murmur, click, rub or gallop Lungs: clear to auscultation bilaterally Abdomen: soft, non-tender; bowel sounds normal; no masses,  no organomegaly Extremities: extremities normal, atraumatic, no cyanosis or edema and  Homans sign is negative, no sign of DVT Wound: Patient's port sites and incision are well-healed He has no cervical or supraclavicular adenopathy  Diagnostic Studies & Laboratory data:     Recent Radiology Findings:   Ct Chest Wo Contrast  06/04/2014   CLINICAL DATA:  Lung cancer status post partial left lung resection 1 year ago. Incisional chest pain. Subsequent encounter.  EXAM: CT CHEST WITHOUT CONTRAST  TECHNIQUE: Multidetector CT imaging of the chest was performed following the standard protocol without IV contrast.  COMPARISON:  Chest CT 11/20/2013.  PET-CT 04/17/2013.  FINDINGS: Mediastinum/Nodes: There are no enlarged mediastinal, hilar or axillary lymph nodes. The thyroid gland, trachea and esophagus demonstrate no significant findings. The heart size is normal. There is no pericardial effusion.There is diffuse atherosclerosis of the aorta, great vessels and coronary arteries. Contour irregularity of the aortic arch is stable.  Lungs/Pleura: There is no pleural effusion. There are stable postsurgical changes status post left upper lobe resection. There is stable distortion of the left hilum and left mainstem bronchus.  Upper abdomen: Hepatic steatosis and cholelithiasis again noted. There is mild renal cortical thinning. No adrenal mass.  Musculoskeletal/Chest wall: There is no chest wall mass or suspicious osseous finding. Stable thoracotomy changes on the left.  IMPRESSION: 1. Stable postoperative chest CT. 2. There is stable postsurgical distortion of the left hilum and mainstem bronchus. No evidence of local recurrence or metastatic disease. 3. Cholelithiasis.   Electronically Signed   By: Camie Patience M.D.   On: 06/04/2014 14:39    I have independently reviewed the above radiology studies  and reviewed the findings with the patient.   Recent Lab Findings: Lab Results  Component Value Date   WBC 14.4* 06/08/2013   HGB 12.0* 06/08/2013   HCT 35.3* 06/08/2013   PLT 149* 06/08/2013    GLUCOSE 111* 06/10/2013   ALT 18 06/06/2013   AST 29 06/06/2013   NA 143 06/10/2013   K 4.1 06/10/2013   CL 105 06/10/2013   CREATININE 1.04 06/10/2013   BUN 13 06/10/2013   CO2 28 06/10/2013   TSH 4.221 06/08/2013   INR 0.99 05/31/2013      Assessment / Plan:    Well differentiated neuroendocrine tumor/ carcinoid stage IB patient is doing well postoperatively, after presentation to the multidisciplinary thoracic oncology conference no further treatment other than observation was recommended.  CT scan done today of the chest shows no evidence of recurrent lung mass. Plan to see him back in 6 months with a followup CT scan of the chest which will be approximate 18  months postop. Ct scan  Dx of gallstones- no symptoms  [name] MD      [address] [place] [address] Office [phone]   Beeper [phone]  06/04/2014 3:27 PM

## 2014-07-31 DIAGNOSIS — E785 Hyperlipidemia, unspecified: Secondary | ICD-10-CM | POA: Diagnosis not present

## 2014-07-31 DIAGNOSIS — I10 Essential (primary) hypertension: Secondary | ICD-10-CM | POA: Diagnosis not present

## 2014-07-31 DIAGNOSIS — R7301 Impaired fasting glucose: Secondary | ICD-10-CM | POA: Diagnosis not present

## 2014-07-31 DIAGNOSIS — D72821 Monocytosis (symptomatic): Secondary | ICD-10-CM | POA: Diagnosis not present

## 2014-08-14 DIAGNOSIS — R748 Abnormal levels of other serum enzymes: Secondary | ICD-10-CM | POA: Diagnosis not present

## 2014-08-26 DIAGNOSIS — H269 Unspecified cataract: Secondary | ICD-10-CM | POA: Diagnosis not present

## 2014-08-30 NOTE — Discharge Summary (Signed)
PATIENT NAME:  Jason Malone, REPPERT MR#:  [id number] DATE OF BIRTH:  04-Feb-1941  DATE OF ADMISSION:  03/29/2013 DATE OF DISCHARGE:  03/30/2013  PRIMARY CARE PHYSICIAN:  [facility].  FINAL DIAGNOSES: 1.  Atrial fibrillation and flutter.  2.  Relative hypotension.  3.  Hypomagnesemia and hypokalemia.  4.  Lung mass, left hilar area.  5.  Hyperlipidemia.   MEDICATIONS ON DISCHARGE: Include simvastatin 40 mg in the morning, multivitamin daily, B complex 50 daily, vitamin C daily, fiber therapy daily, diltiazem 30 mg every 8 hours, magnesium oxide 400 mg once a day for 5 days, potassium chloride 10 mEq daily for 3 days.   DIET: Low sodium diet, regular consistency.   ACTIVITY: As tolerated.   The patient will follow up with Dr. Grayland Ormond of oncology next week, Dr. Mortimer Fries for bronchoscopy.   HOSPITAL COURSE: The patient was admitted 03/29/2013, and discharged 03/30/2013. Admitted with supraventricular tachycardia versus atrial fibrillation. Was admitted to the CCU and put on a Cardizem drip. Laboratory and radiological data during the hospital course included a magnesium of 1.6, TSH of 3.54, glucose 132, BUN 15, creatinine 1.11, sodium 136, potassium 3.1, chloride 104, CO2 of 26, calcium 9.2. CBC included a white blood cell count 12.9, H and H 14.9 and 44.4, platelet count of 190. Cardiac enzymes negative. Echo showed EF 55% to 60%. No valve disease. Creatinine upon discharge 1.12, potassium 3.4.   HOSPITAL COURSE PER PROBLEM LIST:  1.  For the patient's atrial fibrillation and flutter, the patient was put on a Cardizem drip, admitted to the CCU. The patient converted to normal sinus rhythm. Cardizem drip was stopped and Cardizem orally started, low-dose, secondary to the patient's relative hypotension. The patient will follow up with Dr. Rockey Situ, cardiology, as outpatient. Thyroid, normal range, could also be worsened with the electrolyte abnormalities.  2.  Relative hypotension. The patient's  lisinopril HCT stopped, low-dose Cardizem given.  3.  Hypomagnesemia and hypokalemia. Magnesium was replaced IV on the day of discharge, potassium orally, supplementation of both electrolytes for the next few days will be given. I think stopping hydrochlorothiazide  will be helpful in this situation.  4.  Lung mass, left hilar area, likely a lung cancer. Dr. Mortimer Fries to set up a bronchoscopy and biopsy next week, and follow up with Dr. Grayland Ormond, oncology. Both specialists saw the patient here in the hospital and will follow up closely as outpatient.  5.  Hyperlipidemia, on Zocor.   TIME SPENT ON DISCHARGE: 35 minutes.   ____________________________ Tana Conch. Leslye Peer, MD rjw:dmm D: 03/30/2013 17:47:18 ET T: 03/30/2013 19:09:24 ET JOB#: [id number]  cc: Tana Conch. Leslye Peer, MD, <Dictator> [place] Grayland Ormond, MD Mariane Duval, MD Minna Merritts, MD Marisue Brooklyn MD ELECTRONICALLY SIGNED 04/03/2013 14:19

## 2014-08-30 NOTE — Consult Note (Signed)
patient seen and evaluated.  Case also discussed with Dr. Mortimer Fries.  Full consult to follow.  Electronic Signatures: Delight Hoh (MD)  (Signed on 21-Nov-14 09:55)  Authored  Last Updated: 21-Nov-14 09:55 by Delight Hoh (MD)

## 2014-08-30 NOTE — H&P (Signed)
PATIENT NAME:  Jason Malone, FELDNER MR#:  [id number] DATE OF BIRTH:  31-Jul-1940  DATE OF ADMISSION:  03/29/2013  ADMITTING PHYSICIAN: Gladstone Lighter, M.D.   PRIMARY CARE PHYSICIAN: Financial risk analyst.   CHIEF COMPLAINT: Sent in from PCPs office for tachycardia.   HISTORY OF PRESENT ILLNESS: Mr. Benning is a 74 year old very functional Caucasian male with past medical history significant for hypertension and hyperlipidemia who came to the ER last night secondary to nonspecefic chest pains and not feeling well. He had blood work done which was completely normal. Troponins were negative and had a CT chest which did not show any PE but showed a left hilar mass, and the patient was asked for  outpatient follow up with his PCP. So the patient went to his PCP's office today. He denies any more chest pain. After he went home last night, he was chest pain-free. He denies any palpitations, dizziness, lightheadedness, or difficulty breathing.   When he went to the PCP's office, the pulse was noted to be in the 160s, so EKG was done which showed SVT in the 160s, so he was sent to the ER. In the ER, the patient is completely asymptomatic. His EKG shows SVT versus possibly underlying A. flutter with heart rate in the 160s, not responded to IV Cardizem pushes. He is being admitted for the same.   PAST MEDICAL HISTORY:  1.  Hypertension.  2.  Hyperlipidemia.   PAST SURGICAL HISTORY:  1.  Tonsillectomy and adenoidectomy.  2.  Colonoscopies and upper GI endoscopy.   ALLERGIES: No known drug allergies.   CURRENT HOME MEDICATIONS:  1.  Lisinopril hydrochlorothiazide 10/12.5 mg one tablet p.o. daily.  2.  Multivitamin 1 tablet p.o. daily.  3.  B complex 1 tablet p.o. daily.  4.  Fiber therapy 1 tablet p.o. daily.  5.  Simvastatin 1 tablet p.o. daily.  6.  Vitamin C 1 tablet p.o. daily.   SOCIAL HISTORY: Lives at home with his wife. Quit smoking in the year 2000, about 14 years ago, but before that, he used  to smoke 2 to 3 packs per day.   FAMILY HISTORY: Heart disease in mom and also had Alzheimer dementia.   REVIEW OF SYSTEMS: CONSTITUTIONAL: Positive for fever and night sweats. No fatigue or weakness.  EYES: No blurred vision, double vision, inflammation, glaucoma or cataracts.  ENT: No tinnitus, ear pain, hearing lose, epistaxis or discharge.  RESPIRATORY: No cough, wheeze, hemoptysis or COPD. CARDIOVASCULAR: No chest pain, orthopnea, palpitations or syncope.  GASTROINTESTINAL: No nausea, vomiting, abdominal pain, hematemesis or melena.  GENITOURINARY: No dysuria, hematuria, renal calculus, frequency or incontinence.  ENDOCRINE: No polyuria, nocturia, thyroid problems, heat or cold intolerance.  HEMATOLOGY: No anemia, easy bruising or bleeding.  SKIN: No acne, rash or lesions.  MUSCULOSKELETAL: No neck, back, shoulder pain, arthritis or gout.  NEUROLOGIC: No numbness, weakness, CVA, TIA or seizures.  PSYCHOLOGICAL: No anxiety, insomnia or depression.   PHYSICAL EXAMINATION:  VITAL SIGNS: Temperature 98.6 degrees Fahrenheit, pulse 164, respirations 16, blood pressure 127/78 and pulse oximetry 96% on room air.  GENERAL: Well-built, well-nourished male lying in bed, not in any acute distress.  HEENT: Normocephalic, atraumatic. Pupils equal, round, reacting to light. Anicteric sclerae. Extraocular movements intact. Oropharynx clear without erythema, mass, or exudates.  NECK: Supple. No thyromegaly, JVD, or carotid bruits. No lymphadenopathy.  LUNGS: Moving air bilaterally. No wheeze or crackles. No use of accessory muscles for breathing.  CARDIOVASCULAR: S1, S2, rapid rate and regular rhythm. No  murmurs, rubs or gallops.  ABDOMEN: Soft, nontender, nondistended. No hepatosplenomegaly. Normal bowel sounds.  EXTREMITIES: No pedal edema. No clubbing or cyanosis, 2+ dorsalis pedis pulses palpable bilaterally.  SKIN: No acne, rash or lesions.  LYMPHATICS: No cervical or inguinal lymphadenopathy.   NEUROLOGIC: Cranial nerves intact. No focal motor or sensory deficits.  PSYCHOLOGICAL: Awake, alert, oriented x3.   LABORATORY DATA: WBC 12.9, hemoglobin 14.9, hematocrit 44.4, platelet count 190.   Sodium 136, potassium 3.1, chloride 104, bicarb 26, BUN 15, creatinine 1.1, glucose 132 and calcium of 9.2.   CT angiogram from March 28, 2013, showing no pulmonary embolism or acute cardiopulmonary process. A 4 x 4 x 4 cm left hilar mass highly suspicious for primary lung neoplasm seen. Nodule seen on a prior chest x-ray in the right hilar region corresponds to a calcified granuloma.   EKG is showing SVT, heart rate of 164.   ASSESSMENT AND PLAN: This is a 74 year old male with past medical history significant for hypertension, hyperlipidemia, and just diagnosed to have a left hilar pulmonary mass on CT chest last night, sent in from primary care physician's office with elevated heart rate.  1.  New-onset supraventricular tachycardia versus underlying atrial fibrillation/flutter. Did not respond to Cardizem push. Adenosine has not been tried, so we will try adenosine pushes twice. If not improved, will start a Cardizem drip and admit to critical care unit. Cardiology has been notified. Dr. Rockey Situ has been notified.  2.  Echocardiogram ordered and will start Lovenox for anticoagulation. The patient has had gastric ulcers with aspirin in the past and we will hold off on starting aspirin too.  3.  Hypertension. Continue on medications. Replace potassium. 4.  Hyperlipidemia. Continue statin.  5.  Left hilar mass. The patient has risk factors with being a previous smoker. Will get pulmonary consult. Likely underlying malignancy.  6.  Gastrointestinal and deep vein thrombosis prophylaxes with ranitidine and on Lovenox.   CODE STATUS: FULL CODE.   TIME SPENT ADMISSION: 50 minutes.  ____________________________ Gladstone Lighter, MD rk:np D: 03/29/2013 16:16:39 ET T: 03/29/2013 17:32:08  ET JOB#: [id number]  cc: Gladstone Lighter, MD, <Dictator> Guadalupe Maple, MD Gladstone Lighter MD ELECTRONICALLY SIGNED 04/03/2013 18:21

## 2014-08-30 NOTE — Consult Note (Signed)
History of Present Illness:  Reason for Consult Lung mass highly suspicious for malignancy   HPI   Patient is a 74 year old male with a long-standing history of tobacco use although quit approximately 14 years ago presented to the emergency room with chest pain secondary to rapid A. fib.  Subsequent CT scan is completed and noted a left hilar mass.  Currently, patient's cardiac status is stable and he feels back to his baseline.  He denies any recent fevers.  He has no neurologic complaints.  He denies any weight loss.  He has no further chest pain and denies any shortness of breath, cough, or hemoptysis.  He denies any nausea, vomiting, constipation, or diarrhea.  He has no urinary complaints.  Patient feels back to his baseline and offers no specific complaints today.  PFSH:  Additional Past Medical and Surgical History Hypertension, hyperlipidemia, tonsillectomy.  Family history: CAD, Alzheimer's.  Social history: Quit smoking in 2000, prior to that 2-3 packs per day.  No report of alcohol use.   Review of Systems:  Performance Status (ECOG) 0   Review of Systems   As per HPI. Otherwise, 10 point system review was negative.   NURSING NOTES: **Vital Signs.:   21-Nov-14 07:28   Temperature Temperature (F): 98.7   Celsius: 37   Temperature Source: oral   Pulse Pulse: 70   Respirations Respirations: 24   Systolic BP Systolic BP: 96   Diastolic BP (mmHg) Diastolic BP (mmHg): 54   Mean BP: 68   Pulse Ox % Pulse Ox %: 97   Pulse Ox Activity Level: At rest   Oxygen Delivery: Room Air/ 21 %   Pulse Ox Heart Rate: 68   Physical Exam:  Physical Exam General: Well-developed, well-nourished, no acute distress. Eyes: Pink conjunctiva, anicteric sclera. HEENT: Normocephalic, moist mucous membranes, clear oropharnyx. Lungs: Clear to auscultation bilaterally. Heart: Regular rate and rhythm. No rubs, murmurs, or gallops. Abdomen: Soft, nontender, nondistended. No  organomegaly noted, normoactive bowel sounds. Musculoskeletal: No edema, cyanosis, or clubbing. Neuro: Alert, answering all questions appropriately. Cranial nerves grossly intact. Skin: No rashes or petechiae noted. Psych: Normal affect. Lymphatics: No cervical, calvicular, axillary or inguinal LAD.    No Known Allergies:     diltiazem 30 mg oral tablet: 1 tab(s) orally every 8 hours, Status: Active, Quantity: 90, Refills: None   magnesium oxide 400 mg (241.3 mg elemental magnesium) oral tablet: 1 tab(s) orally once a day x 5 days, Status: Active, Quantity: 5, Refills: None   potassium chloride 10 mEq oral tablet, extended release: 1 tab(s) orally once a day x 3 days, Status: Active, Quantity: 3, Refills: None   simvastatin 40 mg oral tablet: 1 tab(s) orally once a day (in the morning), Status: Active, Quantity: 0, Refills: None   multivitamin: 1 tab(s) orally once a day, Status: Active, Quantity: 0, Refills: None   B Complex 50: 1 tab(s) orally once a day, Status: Active, Quantity: 0, Refills: None   Vitamin C: 1 tab(s) orally once a day, Status: Active, Quantity: 0, Refills: None   Fiber Therapy: 1 tab(s) orally once a day, Status: Active, Quantity: 0, Refills: None  Laboratory Results: Routine Chem:  21-Nov-14 05:19   Glucose, Serum  119  BUN 13  Creatinine (comp) 1.12  Sodium, Serum 139  Potassium, Serum  3.4  Chloride, Serum  108  CO2, Serum 24  Calcium (Total), Serum 8.8  Anion Gap 7  Osmolality (calc) 279  eGFR (African American) >60  eGFR (Non-African American) >  60 (eGFR values <95m/min/1.73 m2 may be an indication of chronic kidney disease (CKD). Calculated eGFR is useful in patients with stable renal function. The eGFR calculation will not be reliable in acutely ill patients when serum creatinine is changing rapidly. It is not useful in  patients on dialysis. The eGFR calculation may not be applicable to patients at the low and high extremes of body sizes,  pregnant women, and vegetarians.)  Routine Hem:  21-Nov-14 05:19   WBC (CBC)  10.9  RBC (CBC)  4.26  Hemoglobin (CBC) 13.1  Hematocrit (CBC)  38.0  Platelet Count (CBC) 180  MCV 89  MCH 30.7  MCHC 34.4  RDW 13.6  Neutrophil % 71.2  Lymphocyte % 10.8  Monocyte % 15.9  Eosinophil % 1.6  Basophil % 0.5  Neutrophil #  7.8  Lymphocyte # 1.2  Monocyte #  1.7  Eosinophil # 0.2  Basophil # 0.1 (Result(s) reported on 30 Mar 2013 at [facility])   Assessment and Plan: Impression:   Lung mass highly suspicious for malignancy Plan:   1.  Lung mass: Highly likely a lung primary.  Case has been discussed with Dr. KMortimer Friesand he plans to do a bronchoscopy or EBUS next week.  Patient is unlikely a surgical candidate given the location of the mass, but will further discuss case with Dr. TGraciela Husbandsfor completeness.  [place] discussion of his biopsy results, completion of staging workup, and treatment planning.  Both he and his family expressed understanding and were in agreement with this plan. consult.  Electronic Signatures: FDelight Hoh(MD)  (Signed 21-Nov-14 13:46)  Authored: HISTORY OF PRESENT ILLNESS, PFSH, ROS, NURSING NOTES, PE, ALLERGIES, HOME MEDICATIONS, LABS, ASSESSMENT AND PLAN   Last Updated: 21-Nov-14 13:46 by FDelight Hoh(MD)

## 2014-08-30 NOTE — Consult Note (Signed)
General Aspect Mr. Montefusco is a 74 year old very functional Caucasian male with past medical history significant for newly diagnosed lung CA on the left, hypertension and hyperlipidemia who presents from his PMD for tachycardia.Cardiology was consulted for atrial flutter wityh rates in the 150s.  He came to the ER last night secondary to chest pains and not feeling well.blood work  was completely normal. Troponins were negative and had a CT chest which showed a left hilar mass. the patient went to his PCP's office. the pulse was noted to be in the 160s, so EKG was done which showed narrow complex rhythm in the 160s, so he was sent to the ER.   In the ER, the patient is completely asymptomatic. His EKG shows A. flutter with heart rate in the 160s, He did not responded to IV Cardizem pushes. He is being admitted. In the ccu, he is on a cardizem infusion, rate has improved to 100 bpm, atrial flutter   Present Illness . SOCIAL HISTORY:  Lives at home with his wife. Quit smoking in the year 2000, about 14 years ago, but before that, he used to smoke 2 to 3 packs per day.   FAMILY HISTORY:  Heart disease in mom and also had Alzheimer dementia.   Physical Exam:  GEN well developed   HEENT hearing intact to voice, moist oral mucosa   NECK supple   RESP normal resp effort  clear BS   CARD Irregular rate and rhythm  Tachycardic   ABD no hernia  soft   LYMPH negative neck   EXTR negative edema   SKIN normal to palpation   NEURO cranial nerves intact, motor/sensory function intact   PSYCH alert, A+O to time, place, person, good insight   Review of Systems:  Subjective/Chief Complaint Feels ok, anxious about lung CA   General: Fatigue   Skin: No Complaints   ENT: No Complaints   Eyes: No Complaints   Neck: No Complaints   Respiratory: No Complaints   Cardiovascular: No Complaints  chest pain resolved   Gastrointestinal: No Complaints   Genitourinary: No Complaints    Vascular: No Complaints   Musculoskeletal: No Complaints   Neurologic: No Complaints   Hematologic: No Complaints   Endocrine: No Complaints   Psychiatric: No Complaints   Review of Systems: All other systems were reviewed and found to be negative   Medications/Allergies Reviewed Medications/Allergies reviewed        Admit Diagnosis:   ATRIAL FIBRILLATION: Onset Date: 29-Mar-2013, Status: Active, Description: ATRIAL FIBRILLATION  Home Medications: Medication Instructions Status  Fiber Therapy 1 tab(s) orally once a day Active  Vitamin C 1 tab(s) orally once a day Active  B Complex 50 1 tab(s) orally once a day Active  multivitamin 1 tab(s) orally once a day Active  hydrochlorothiazide-lisinopril 12.5 mg-10 mg oral tablet 1 tab(s) orally once a day Active  simvastatin 40 mg oral tablet 1 tab(s) orally once a day (in the morning) Active   Lab Results:  Thyroid:  20-Nov-14 14:20   Thyroid Stimulating Hormone 3.54 (0.45-4.50 (International Unit)  ----------------------- Pregnant patients have  different reference  ranges for TSH:  - - - - - - - - - -  Pregnant, first trimetser:  0.36 - 2.50 uIU/mL)  Routine Chem:  20-Nov-14 14:20   Glucose, Serum  132  BUN 15  Creatinine (comp) 1.11  Sodium, Serum 136  Potassium, Serum  3.1  Chloride, Serum 104  CO2, Serum 26  Calcium (Total), Serum  9.2  Anion Gap  6  Osmolality (calc) 275  eGFR (African American) >60  eGFR (Non-African American) >60 (eGFR values <70mL/min/1.73 m2 may be an indication of chronic kidney disease (CKD). Calculated eGFR is useful in patients with stable renal function. The eGFR calculation will not be reliable in acutely ill patients when serum creatinine is changing rapidly. It is not useful in  patients on dialysis. The eGFR calculation may not be applicable to patients at the low and high extremes of body sizes, pregnant women, and vegetarians.)  Magnesium, Serum  1.6 (1.8-2.4 THERAPEUTIC  RANGE: 4-7 mg/dL TOXIC: > 10 mg/dL  -----------------------)  Cardiac:  20-Nov-14 14:20   Troponin I < 0.02 (0.00-0.05 0.05 ng/mL or less: NEGATIVE  Repeat testing in 3-6 hrs  if clinically indicated. >0.05 ng/mL: POTENTIAL  MYOCARDIAL INJURY. Repeat  testing in 3-6 hrs if  clinically indicated. NOTE: An increase or decrease  of 30% or more on serial  testing suggests a  clinically important change)  Routine Hem:  20-Nov-14 14:20   WBC (CBC)  12.9  RBC (CBC) 4.92  Hemoglobin (CBC) 14.9  Hematocrit (CBC) 44.4  Platelet Count (CBC) 190 (Result(s) reported on 29 Mar 2013 at 02:47PM.)  MCV 90  MCH 30.3  MCHC 33.6  RDW 14.0   EKG:  Interpretation EKG shows atrial flutter with rate 164 bpm, nonspecific ST abn    No Known Allergies:   Vital Signs/Nurse's Notes: **Vital Signs.:   20-Nov-14 19:00  Vital Signs Type Routine  Temperature Temperature (F) 99.8  Celsius 37.6  Temperature Source oral  Pulse Pulse 88  Respirations Respirations 20  Systolic BP Systolic BP 95  Diastolic BP (mmHg) Diastolic BP (mmHg) 56  Mean BP 69  Pulse Ox % Pulse Ox % 94  Pulse Ox Activity Level  At rest  Oxygen Delivery Room Air/ 21 %  Pulse Ox Heart Rate 94    Impression Mr. Laban is a 74 year old very functional Caucasian male with past medical history significant for newly diagnosed lung CA on the left, hypertension and hyperlipidemia who presents from his PMD for tachycardia.Cardiology was consulted for atrial flutter with rates in the 150s.  1) Atrial flutter rate improved on diltiazem infusion. Will review previous EKG yesterday. If new atrial flutter, will start amiodarone infusion in an effort to convert him to NSR If he has been in atrial flutter for unknown period of time, would anticoagulatfe, rate control, c ardioversion at a later date as an outpt --Echo pending -on lovenox subcutaneous BID  2) Lung CA Needs referral to oncology prior hx of smoking, stopped in  2000  3)HTN would continue out pt meds  4) Hyperlipidemia would continue statin   Electronic Signatures for Addendum Section:  Ida Rogue (MD) (Signed Addendum 21-Nov-14 07:33)  Unable to obtrain EKG from 11/19. Will talk with EKG dept today   Electronic Signatures: Ida Rogue (MD)  (Signed [phone] 20:09)  Authored: General Aspect/Present Illness, History and Physical Exam, Review of System, Health Issues, Home Medications, Labs, EKG , Allergies, Vital Signs/Nurse's Notes, Impression/Plan   Last Updated: 21-Nov-14 07:33 by Ida Rogue (MD)

## 2014-11-04 ENCOUNTER — Other Ambulatory Visit: Payer: Self-pay | Admitting: Cardiothoracic Surgery

## 2014-11-04 DIAGNOSIS — R911 Solitary pulmonary nodule: Secondary | ICD-10-CM

## 2014-11-22 ENCOUNTER — Other Ambulatory Visit: Payer: Self-pay | Admitting: *Deleted

## 2014-11-22 MED ORDER — LOSARTAN POTASSIUM 100 MG PO TABS
50.0000 mg | ORAL_TABLET | Freq: Every day | ORAL | Status: DC
Start: 2014-11-22 — End: 2014-12-13

## 2014-12-10 DIAGNOSIS — Z1283 Encounter for screening for malignant neoplasm of skin: Secondary | ICD-10-CM | POA: Diagnosis not present

## 2014-12-10 DIAGNOSIS — Z872 Personal history of diseases of the skin and subcutaneous tissue: Secondary | ICD-10-CM | POA: Diagnosis not present

## 2014-12-10 DIAGNOSIS — Z09 Encounter for follow-up examination after completed treatment for conditions other than malignant neoplasm: Secondary | ICD-10-CM | POA: Diagnosis not present

## 2014-12-12 ENCOUNTER — Ambulatory Visit (INDEPENDENT_AMBULATORY_CARE_PROVIDER_SITE_OTHER): Payer: Medicare Other | Admitting: Cardiothoracic Surgery

## 2014-12-12 ENCOUNTER — Ambulatory Visit
Admission: RE | Admit: 2014-12-12 | Discharge: 2014-12-12 | Disposition: A | Discharge status: Home / Self Care | Payer: Medicare Other | Source: Ambulatory Visit | Attending: Cardiothoracic Surgery | Admitting: Cardiothoracic Surgery

## 2014-12-12 ENCOUNTER — Encounter: Payer: Self-pay | Admitting: Cardiothoracic Surgery

## 2014-12-12 VITALS — BP 130/68 | HR 60 | Resp 20 | Ht 65.0 in | Wt 170.0 lb

## 2014-12-12 DIAGNOSIS — Z9889 Other specified postprocedural states: Secondary | ICD-10-CM | POA: Diagnosis not present

## 2014-12-12 DIAGNOSIS — C3402 Malignant neoplasm of left main bronchus: Secondary | ICD-10-CM

## 2014-12-12 DIAGNOSIS — Z85118 Personal history of other malignant neoplasm of bronchus and lung: Secondary | ICD-10-CM | POA: Diagnosis not present

## 2014-12-12 DIAGNOSIS — R911 Solitary pulmonary nodule: Secondary | ICD-10-CM

## 2014-12-12 DIAGNOSIS — Z902 Acquired absence of lung [part of]: Secondary | ICD-10-CM

## 2014-12-12 NOTE — Progress Notes (Signed)
[address]       [place] [address]             [phone]      Prynce W Kimmet [place] Medical Record #[id number] Date of Birth: 11/30/1940  Referring: Nestor Lewandowsky, MD Primary Care: [name], MD  Chief Complaint:   POST OP FOLLOW UP 06/04/2013  OPERATIVE REPORT  PREOPERATIVE DIAGNOSIS: Large left hilar carcinoid tumor.  POSTOPERATIVE DIAGNOSIS: Large left hilar carcinoid tumor.  SURGICAL PROCEDURE: Bronchoscopy, left video-assisted thoracoscopy,  minithoracotomy, left upper lobectomy with lymph node dissection, re-  resection of the bronchial stump, and placement of On-Q device.  Mediastinal lymph node dissection.  SURGEON: Lanelle Bal, MD  Lung cancer   Primary site: Lung (Left)   Staging method: AJCC 7th Edition   Pathologic free text: well differentiated Neuroendocrine Tumor (carcinoid)   Pathologic: Stage IB (T2a, N0, cM0) signed by [name], MD on 06/06/2013  9:21 AM   Summary: Stage IB (T2a, N0, cM0)  History of Present Illness:     Patient continues to do wellpostoperatively. He is not limited by shortness of breath. He has occasional sharp pains in his left chest wall with movement. He notes some cough intermittently if he lies on his right side. No hemoptysis. He's had no further problems with passing urine as he did in the early postoperative period remains on flomax.  Zubrod Score: At the time of surgery this patient's most appropriate activity status/level should be described as: '[x]'$     0    Normal activity, no symptoms '[]'$     1    Restricted in physical strenuous activity but ambulatory, able to do out light work '[]'$     2    Ambulatory and capable of self care, unable to do work activities, up and about >50 % of waking hours                              '[]'$     3    Only limited self care, in bed greater than 50% of waking hours '[]'$     4    Completely disabled, no self care, confined to bed or chair '[]'$     5    Moribund     Past Medical History  Diagnosis Date  . Hypertension   . Hyperlipemia   . Full dentures   . Wears glasses   . Enlarged prostate   . A-fib   . Hypomagnesemia   . H/O hypokalemia   . Diverticulitis   . Lung cancer     carcinoid     History  Smoking status  . Former Smoker  . Quit date: 12/09/1998  Smokeless tobacco  . Never Used    History  Alcohol Use No     Allergies  Allergen Reactions  . Latex Itching  . Tape Itching    Surgical tapes     Current Outpatient Prescriptions  Medication Sig Dispense Refill  . b complex vitamins capsule Take 1 capsule by mouth daily.    . carvedilol (COREG) 3.125 MG tablet Take 1 tablet (3.125 mg total) by mouth 2 (two) times daily. 180 tablet 3  . finasteride (PROSCAR) 5 MG tablet Take 5 mg by mouth daily.     . fluticasone (FLONASE) 50 MCG/ACT nasal spray Place 1 spray into both nostrils daily.     [name] losartan (COZAAR) 100 MG tablet Take  0.5 tablets (50 mg total) by mouth daily. 45 tablet 3  . Multiple Vitamins-Minerals (MULTIVITAMIN WITH MINERALS) tablet Take 1 tablet by mouth daily.    . polycarbophil (FIBERCON) 625 MG tablet Take 625 mg by mouth daily.    . simvastatin (ZOCOR) 40 MG tablet Take 40 mg by mouth every morning.    . tamsulosin (FLOMAX) 0.4 MG CAPS capsule Take 0.4 mg by mouth daily.     . vitamin C (ASCORBIC ACID) 500 MG tablet Take 500 mg by mouth daily.     No current facility-administered medications for this visit.       Physical Exam: BP 130/68 mmHg  Pulse 60  Resp 20  Ht '5\' 5"'$  (1.651 m)  Wt 170 lb (77.111 kg)  BMI 28.29 kg/m2  SpO2 98%  General appearance: alert and cooperative Neurologic: intact Heart: regular rate and rhythm, S1, S2 normal, no murmur, click, rub or gallop Lungs: clear to auscultation bilaterally Abdomen: soft, non-tender; bowel sounds normal; no masses,  no organomegaly Extremities: extremities normal, atraumatic, no cyanosis or edema and Homans sign is negative, no sign of  DVT Wound: Patient's port sites and incision are well-healed He has no cervical or supraclavicular or axillary  adenopathy  Diagnostic Studies & Laboratory data:     Recent Radiology Findings:  Ct Chest Wo Contrast  12/12/2014   CLINICAL DATA:  LEFT upper lobe resection for lung cancer January 2015.  EXAM: CT CHEST WITHOUT CONTRAST  TECHNIQUE: Multidetector CT imaging of the chest was performed following the standard protocol without IV contrast.  COMPARISON:  CT 06/04/2014  FINDINGS: Mediastinum/Nodes: No axillary supraclavicular. No mediastinal hilar adenopathy. No fluid. Esophagus.  Lungs/Pleura: Volume loss in the LEFT hemithorax consistent with LEFT upper lobe resection. No nodularity in the LEFT lung. Surgical staples in the LEFT suprahilar location and along the mediastinum.  RIGHT lung is clear. Calcified granuloma in the RIGHT upper lobe.  Upper abdomen: Limited view of the liver, kidneys, pancreas are unremarkable. Normal adrenal glands. Small gallstones noted  Musculoskeletal: No aggressive osseous lesion.  IMPRESSION: 1. Postsurgical change in the LEFT hemithorax without evidence of local lung cancer recurrence 2. No mediastinal adenopathy.   Electronically Signed   By: Suzy Bouchard M.D.   On: 12/12/2014 13:14    Ct Chest Wo Contrast  06/04/2014   CLINICAL DATA:  Lung cancer status post partial left lung resection 1 year ago. Incisional chest pain. Subsequent encounter.  EXAM: CT CHEST WITHOUT CONTRAST  TECHNIQUE: Multidetector CT imaging of the chest was performed following the standard protocol without IV contrast.  COMPARISON:  Chest CT 11/20/2013.  PET-CT 04/17/2013.  FINDINGS: Mediastinum/Nodes: There are no enlarged mediastinal, hilar or axillary lymph nodes. The thyroid gland, trachea and esophagus demonstrate no significant findings. The heart size is normal. There is no pericardial effusion.There is diffuse atherosclerosis of the aorta, great vessels and coronary arteries. Contour  irregularity of the aortic arch is stable.  Lungs/Pleura: There is no pleural effusion. There are stable postsurgical changes status post left upper lobe resection. There is stable distortion of the left hilum and left mainstem bronchus.  Upper abdomen: Hepatic steatosis and cholelithiasis again noted. There is mild renal cortical thinning. No adrenal mass.  Musculoskeletal/Chest wall: There is no chest wall mass or suspicious osseous finding. Stable thoracotomy changes on the left.  IMPRESSION: 1. Stable postoperative chest CT. 2. There is stable postsurgical distortion of the left hilum and mainstem bronchus. No evidence of local recurrence or metastatic disease. 3.  Cholelithiasis.   Electronically Signed   By: Camie Patience M.D.   On: 06/04/2014 14:39    I have independently reviewed the above radiology studies  and reviewed the findings with the patient.   Recent Lab Findings: Lab Results  Component Value Date   WBC 14.4* 06/08/2013   HGB 12.0* 06/08/2013   HCT 35.3* 06/08/2013   PLT 149* 06/08/2013   GLUCOSE 111* 06/10/2013   ALT 18 06/06/2013   AST 29 06/06/2013   NA 143 06/10/2013   K 4.1 06/10/2013   CL 105 06/10/2013   CREATININE 1.04 06/10/2013   BUN 13 06/10/2013   CO2 28 06/10/2013   TSH 4.221 06/08/2013   INR 0.99 05/31/2013      Assessment / Plan:   Well differentiated neuroendocrine tumor/ carcinoid stage IB patient is doing well postoperatively We continue recommendation of  multidisciplinary thoracic oncology conference, no further treatment other than observation  CT scan done today of the chest shows no evidence of recurrent lung mass. Plan to see him back in 6 months with a followup CT scan of the chest which will be approximate 24  months postop. Ct scan  Dx of gallstones- no symptoms  [name] MD      [address] [place] [address] Office [phone]   Beeper [phone]  12/12/2014 3:13 PM

## 2014-12-13 ENCOUNTER — Telehealth: Payer: Self-pay | Admitting: Cardiovascular Disease

## 2014-12-13 MED ORDER — LOSARTAN POTASSIUM 100 MG PO TABS: 100.0000 mg | ORAL_TABLET | Freq: Every day | ORAL | Status: DC

## 2014-12-13 NOTE — Telephone Encounter (Signed)
Pt was advised to increase to whole pill if BP was consistently >140/90.  Updated rx sent to pharmacy.

## 2014-12-13 NOTE — Telephone Encounter (Signed)
WILL NEED UPDATED RX .  Patient said he was directed by [facility] to take 1 tab of losartan instead of current rx as 1/2 tab Daily    1. Which medications need to be refilled? Lasartan  2. Which pharmacy is medication to be sent to? [facility] [place]  3. Do they need a 30 day or 90 day supply? 90  4. Would they like a call back once the medication has been sent to the pharmacy? If issues - patient has 12 pills left

## 2015-01-23 ENCOUNTER — Telehealth: Payer: Self-pay | Admitting: Family Medicine

## 2015-01-23 NOTE — Telephone Encounter (Signed)
Last sgpt reviewed; approved Next appt in Oct, will get labs then

## 2015-01-23 NOTE — Telephone Encounter (Signed)
Call in please if e-scripts did not go

## 2015-02-07 DIAGNOSIS — N400 Enlarged prostate without lower urinary tract symptoms: Secondary | ICD-10-CM | POA: Insufficient documentation

## 2015-02-07 DIAGNOSIS — R7301 Impaired fasting glucose: Secondary | ICD-10-CM | POA: Insufficient documentation

## 2015-02-07 DIAGNOSIS — E785 Hyperlipidemia, unspecified: Secondary | ICD-10-CM | POA: Insufficient documentation

## 2015-02-07 DIAGNOSIS — I10 Essential (primary) hypertension: Secondary | ICD-10-CM | POA: Insufficient documentation

## 2015-02-07 DIAGNOSIS — J309 Allergic rhinitis, unspecified: Secondary | ICD-10-CM | POA: Insufficient documentation

## 2015-02-07 DIAGNOSIS — D72821 Monocytosis (symptomatic): Secondary | ICD-10-CM | POA: Insufficient documentation

## 2015-02-14 ENCOUNTER — Ambulatory Visit (INDEPENDENT_AMBULATORY_CARE_PROVIDER_SITE_OTHER): Payer: Medicare Other | Admitting: Family Medicine

## 2015-02-14 ENCOUNTER — Encounter: Payer: Self-pay | Admitting: Family Medicine

## 2015-02-14 VITALS — BP 155/77 | HR 56 | Temp 97.2°F | Ht 63.5 in | Wt 162.0 lb

## 2015-02-14 DIAGNOSIS — Z23 Encounter for immunization: Secondary | ICD-10-CM

## 2015-02-14 DIAGNOSIS — R7301 Impaired fasting glucose: Secondary | ICD-10-CM | POA: Diagnosis not present

## 2015-02-14 DIAGNOSIS — N401 Enlarged prostate with lower urinary tract symptoms: Secondary | ICD-10-CM

## 2015-02-14 DIAGNOSIS — Z5181 Encounter for therapeutic drug level monitoring: Secondary | ICD-10-CM

## 2015-02-14 DIAGNOSIS — E785 Hyperlipidemia, unspecified: Secondary | ICD-10-CM

## 2015-02-14 DIAGNOSIS — I10 Essential (primary) hypertension: Secondary | ICD-10-CM | POA: Diagnosis not present

## 2015-02-14 DIAGNOSIS — J309 Allergic rhinitis, unspecified: Secondary | ICD-10-CM | POA: Diagnosis not present

## 2015-02-14 DIAGNOSIS — D72821 Monocytosis (symptomatic): Secondary | ICD-10-CM | POA: Diagnosis not present

## 2015-02-14 MED ORDER — SIMVASTATIN 40 MG PO TABS: 40.0000 mg | ORAL_TABLET | Freq: Every day | ORAL | Status: DC

## 2015-02-14 MED ORDER — TAMSULOSIN HCL 0.4 MG PO CAPS: 0.4000 mg | ORAL_CAPSULE | Freq: Every day | ORAL | Status: DC

## 2015-02-14 NOTE — Assessment & Plan Note (Signed)
Check CBC 

## 2015-02-14 NOTE — Assessment & Plan Note (Signed)
Advised him to avoid decongestants which can raise BP

## 2015-02-14 NOTE — Patient Instructions (Addendum)
Try the DASH guidelines Try Gardein Meatless Meatballs Boca crumbles (use like ground hamburger) Light Life smart dogs Other options are out there Check out [employer] for recipe ideas  DASH Eating Plan DASH stands for "Dietary Approaches to Stop Hypertension." The DASH eating plan is a healthy eating plan that has been shown to reduce high blood pressure (hypertension). Additional health benefits may include reducing the risk of type 2 diabetes mellitus, heart disease, and stroke. The DASH eating plan may also help with weight loss. WHAT DO I NEED TO KNOW ABOUT THE DASH EATING PLAN? For the DASH eating plan, you will follow these general guidelines:  Choose foods with a percent daily value for sodium of less than 5% (as listed on the food label).  Use salt-free seasonings or herbs instead of table salt or sea salt.  Check with your health care provider or pharmacist before using salt substitutes.  Eat lower-sodium products, often labeled as "lower sodium" or "no salt added."  Eat fresh foods.  Eat more vegetables, fruits, and low-fat dairy products.  Choose whole grains. Look for the word "whole" as the first word in the ingredient list.  Choose fish and skinless chicken or [place] more often than red meat. Limit fish, poultry, and meat to 6 oz (170 g) each day.  Limit sweets, desserts, sugars, and sugary drinks.  Choose heart-healthy fats.  Limit cheese to 1 oz (28 g) per day.  Eat more home-cooked food and less restaurant, buffet, and fast food.  Limit fried foods.  Cook foods using methods other than frying.  Limit canned vegetables. If you do use them, rinse them well to decrease the sodium.  When eating at a restaurant, ask that your food be prepared with less salt, or no salt if possible. WHAT FOODS CAN I EAT? Seek help from a dietitian for individual calorie needs. Grains Whole grain or whole wheat bread. Brown rice. Whole grain or whole wheat pasta. Quinoa,  bulgur, and whole grain cereals. Low-sodium cereals. Corn or whole wheat flour tortillas. Whole grain cornbread. Whole grain crackers. Low-sodium crackers. Vegetables Fresh or frozen vegetables (raw, steamed, roasted, or grilled). Low-sodium or reduced-sodium tomato and vegetable juices. Low-sodium or reduced-sodium tomato sauce and paste. Low-sodium or reduced-sodium canned vegetables.  Fruits All fresh, canned (in natural juice), or frozen fruits. Meat and Other Protein Products Ground beef (85% or leaner), grass-fed beef, or beef trimmed of fat. Skinless chicken or [place]. Ground chicken or [place]. Pork trimmed of fat. All fish and seafood. Eggs. Dried beans, peas, or lentils. Unsalted nuts and seeds. Unsalted canned beans. Dairy Low-fat dairy products, such as skim or 1% milk, 2% or reduced-fat cheeses, low-fat ricotta or cottage cheese, or plain low-fat yogurt. Low-sodium or reduced-sodium cheeses. Fats and Oils Tub margarines without trans fats. Light or reduced-fat mayonnaise and salad dressings (reduced sodium). Avocado. Safflower, olive, or canola oils. Natural peanut or almond butter. Other Unsalted popcorn and pretzels. The items listed above may not be a complete list of recommended foods or beverages. Contact your dietitian for more options. WHAT FOODS ARE NOT RECOMMENDED? Grains White bread. White pasta. White rice. Refined cornbread. Bagels and croissants. Crackers that contain trans fat. Vegetables Creamed or fried vegetables. Vegetables in a cheese sauce. Regular canned vegetables. Regular canned tomato sauce and paste. Regular tomato and vegetable juices. Fruits Dried fruits. Canned fruit in light or heavy syrup. Fruit juice. Meat and Other Protein Products Fatty cuts of meat. Ribs, chicken wings, bacon, sausage, bologna, salami,  chitterlings, fatback, hot dogs, bratwurst, and packaged luncheon meats. Salted nuts and seeds. Canned beans with salt. Dairy Whole or 2% milk,  cream, half-and-half, and cream cheese. Whole-fat or sweetened yogurt. Full-fat cheeses or blue cheese. Nondairy creamers and whipped toppings. Processed cheese, cheese spreads, or cheese curds. Condiments Onion and garlic salt, seasoned salt, table salt, and sea salt. Canned and packaged gravies. Worcestershire sauce. Tartar sauce. Barbecue sauce. Teriyaki sauce. Soy sauce, including reduced sodium. Steak sauce. Fish sauce. Oyster sauce. Cocktail sauce. Horseradish. Ketchup and mustard. Meat flavorings and tenderizers. Bouillon cubes. Hot sauce. Tabasco sauce. Marinades. Taco seasonings. Relishes. Fats and Oils Butter, stick margarine, lard, shortening, ghee, and bacon fat. Coconut, palm kernel, or palm oils. Regular salad dressings. Other Pickles and olives. Salted popcorn and pretzels. The items listed above may not be a complete list of foods and beverages to avoid. Contact your dietitian for more information. WHERE CAN I FIND MORE INFORMATION? National Heart, Lung, and Blood Institute: travelstabloid.com   This information is not intended to replace advice given to you by your health care provider. Make sure you discuss any questions you have with your health care provider.   Document Released: 04/15/2011 Document Revised: 05/17/2014 Document Reviewed: 02/28/2013 Elsevier Interactive Patient Education 2016 Elsevier Inc. Cholesterol Cholesterol is a white, waxy, fat-like substance needed by your body in small amounts. The liver makes all the cholesterol you need. Cholesterol is carried from the liver by the blood through the blood vessels. Deposits of cholesterol (plaque) may build up on blood vessel walls. These make the arteries narrower and stiffer. Cholesterol plaques increase the risk for heart attack and stroke.  You cannot feel your cholesterol level even if it is very high. The only way to know it is high is with a blood test. Once you know your  cholesterol levels, you should keep a record of the test results. Work with your health care provider to keep your levels in the desired range.  WHAT DO THE RESULTS MEAN?  Total cholesterol is a rough measure of all the cholesterol in your blood.   LDL is the so-called bad cholesterol. This is the type that deposits cholesterol in the walls of the arteries. You want this level to be low.   HDL is the good cholesterol because it cleans the arteries and carries the LDL away. You want this level to be high.  Triglycerides are fat that the body can either burn for energy or store. High levels are closely linked to heart disease.  WHAT ARE THE DESIRED LEVELS OF CHOLESTEROL?  Total cholesterol below 200.   LDL below 100 for people at risk, below 70 for those at very high risk.   HDL above 50 is good, above 60 is best.   Triglycerides below 150.  HOW CAN I LOWER MY CHOLESTEROL?  Diet. Follow your diet programs as directed by your health care provider.   Choose fish or white meat chicken and [place], roasted or baked. Limit fatty cuts of red meat, fried foods, and processed meats, such as sausage and lunch meats.   Eat lots of fresh fruits and vegetables.  Choose whole grains, beans, pasta, potatoes, and cereals.   Use only small amounts of olive, corn, or canola oils.   Avoid butter, mayonnaise, shortening, or palm kernel oils.  Avoid foods with trans fats.   Drink skim or nonfat milk and eat low-fat or nonfat yogurt and cheeses. Avoid whole milk, cream, ice cream, egg yolks, and full-fat cheeses.  Healthy desserts include angel food cake, ginger snaps, animal crackers, hard candy, popsicles, and low-fat or nonfat frozen yogurt. Avoid pastries, cakes, pies, and cookies.   Exercise. Follow your exercise programs as directed by your health care provider.   A regular program helps decrease LDL and raise HDL.   A regular program helps with weight control.   Do  things that increase your activity level like gardening, walking, or taking the stairs. Ask your health care provider about how you can be more active in your daily life.   Medicine. Take medicine only as directed by your health care provider.   Medicine may be prescribed by your health care provider to help lower cholesterol and decrease the risk for heart disease.   If you have several risk factors, you may need medicine even if your levels are normal.   This information is not intended to replace advice given to you by your health care provider. Make sure you discuss any questions you have with your health care provider.   Document Released: 01/19/2001 Document Revised: 05/17/2014 Document Reviewed: 02/07/2013 Elsevier Interactive Patient Education [employer].

## 2015-02-14 NOTE — Progress Notes (Signed)
BP 155/77 mmHg  Pulse 56  Temp(Src) 97.2 F (36.2 C)  Ht 5' 3.5" (1.613 m)  Wt 162 lb (73.483 kg)  BMI 28.24 kg/m2  SpO2 97%   Subjective:    Patient ID: Jason Malone, male    DOB: [birth year], 74 y.o.   MRN: [id number]  HPI: Jason Malone is a 74 y.o. male  Chief Complaint  Patient presents with  . Hyperlipidemia  . Hypertension  . IFG   High cholesterol: he has had high cholesterol for about 15-16 years; taking simvastatin; tolerating med well without side effects; does eats sausage; does eat [place] bologna; does eat eggs, maybe 2 per week on average; not a big cheese eater; not many hamburgers; not many veggies; does eat whole wheat bread  Hypertension; going on for 15-16 years; he has heard about DASH, but not following it; he adds salt to food; no decongestants, plain loratidine every morning; not bothered too much, does not want any more medicine  Allergies; using loratidine, not using fluticasone, made his eyes puffy; no pets at home; mostly carpet and carpet in the bedroom  Impaired fasting glucose; does like sweets; does not eat white bread; not many potatoes; not much rice or pasta; does drink diet Coke  He sees his cardiologist once a year  Relevant past medical, surgical, family and social history reviewed and updated as indicated. Interim medical history since our last visit reviewed. Allergies and medications reviewed and updated.  No second-hand smoke exposure  Review of Systems  Constitutional: Negative for unexpected weight change (was 165 in the spring; then gained some weight (his wife says "sweets") and now lost some of that back).  Respiratory: Negative for shortness of breath.   Cardiovascular: Negative for chest pain.  Allergic/Immunologic: Positive for environmental allergies.   Per HPI unless specifically indicated above     Objective:    BP 155/77 mmHg  Pulse 56  Temp(Src) 97.2 F (36.2 C)  Ht 5' 3.5" (1.613 m)  Wt 162 lb (73.483 kg)  BMI  28.24 kg/m2  SpO2 97%  Wt Readings from Last 3 Encounters:  02/14/15 162 lb (73.483 kg)  07/31/14 165 lb (74.844 kg)  12/12/14 170 lb (77.111 kg)    Physical Exam  Constitutional: He appears well-developed and well-nourished. No distress.  HENT:  Head: Normocephalic and atraumatic.  Eyes: EOM are normal. No scleral icterus.  Neck: No thyromegaly present.  Cardiovascular: Normal rate and regular rhythm.   Pulmonary/Chest: Effort normal and breath sounds normal.  Abdominal: Soft. Bowel sounds are normal. He exhibits no distension.  Musculoskeletal: He exhibits no edema.  Neurological: Coordination normal.  Skin: Skin is warm and dry. No pallor.  Psychiatric: He has a normal mood and affect. His behavior is normal. Judgment and thought content normal.       Assessment & Plan:   Problem List Items Addressed This Visit      Cardiovascular and Mediastinum   Essential hypertension    Try the DASH guidelines; see AVS      Relevant Medications   simvastatin (ZOCOR) 40 MG tablet     Respiratory   Allergic rhinitis    Advised him to avoid decongestants which can raise BP        Endocrine   IFG (impaired fasting glucose)    Check A1C; encouraged fewer sweets; avoid white bread      Relevant Orders   Hgb A1c w/o eAG     Genitourinary   Benign prostatic hypertrophy  with lower urinary tract symptoms (LUTS)    Check PSA      Relevant Medications   tamsulosin (FLOMAX) 0.4 MG CAPS capsule   Other Relevant Orders   PSA     Other   Hyperlipidemia - Primary    Check lipid panel; continue statin; monitor sgpt on the statin; discussed eating options for him which could reduce his intake of saturated fats; showed plant-based options such as veggie hot dogs, meat replacement crumbles, etc that he could try in place of red meat and processed pork; limit eggs, cheese; try to get more plants (fruits, veggies, whole grains)      Relevant Medications   simvastatin (ZOCOR) 40 MG  tablet   Other Relevant Orders   Lipid Panel w/o Chol/HDL Ratio   Monocytosis    Check CBC      Relevant Orders   CBC with Differential/Platelet    Other Visit Diagnoses    Encounter for immunization        Needs flu shot        flu vaccine offered and given    Medication monitoring encounter        Relevant Orders    Comprehensive metabolic panel        Follow up plan: Return in about 6 months (around 08/15/2015) for cholesterol, blood pressure, prediabetes, etc..  An after-visit summary was printed and given to the patient at [facility].  Please see the patient instructions which may contain other information and recommendations beyond what is mentioned above in the assessment and plan. Meds ordered this encounter  Medications  . simvastatin (ZOCOR) 40 MG tablet    Sig: Take 1 tablet (40 mg total) by mouth at bedtime.    Dispense:  90 tablet    Refill:  1  . tamsulosin (FLOMAX) 0.4 MG CAPS capsule    Sig: Take 1 capsule (0.4 mg total) by mouth daily.    Dispense:  90 capsule    Refill:  3   Orders Placed This Encounter  Procedures  . Flu Vaccine QUAD 36+ mos IM  . PSA  . CBC with Differential/Platelet  . Hgb A1c w/o eAG  . Lipid Panel w/o Chol/HDL Ratio  . Comprehensive metabolic panel

## 2015-02-14 NOTE — Assessment & Plan Note (Signed)
Check PSA. ?

## 2015-02-14 NOTE — Assessment & Plan Note (Addendum)
Check lipid panel; continue statin; monitor sgpt on the statin; discussed eating options for him which could reduce his intake of saturated fats; showed plant-based options such as veggie hot dogs, meat replacement crumbles, etc that he could try in place of red meat and processed pork; limit eggs, cheese; try to get more plants (fruits, veggies, whole grains)

## 2015-02-14 NOTE — Assessment & Plan Note (Signed)
Try the DASH guidelines; see AVS

## 2015-02-14 NOTE — Assessment & Plan Note (Addendum)
Check A1C; encouraged fewer sweets; avoid white bread

## 2015-02-15 LAB — COMPREHENSIVE METABOLIC PANEL
A/G RATIO: 1.7 (ref 1.1–2.5)
ALT: 14 IU/L (ref 0–44)
AST: 18 IU/L (ref 0–40)
Albumin: 4.2 g/dL (ref 3.5–4.8)
Alkaline Phosphatase: 136 IU/L — ABNORMAL HIGH (ref 39–117)
BUN/Creatinine Ratio: 14 (ref 10–22)
BUN: 12 mg/dL (ref 8–27)
Bilirubin Total: 0.6 mg/dL (ref 0.0–1.2)
CALCIUM: 10 mg/dL (ref 8.6–10.2)
CO2: 25 mmol/L (ref 18–29)
Chloride: 102 mmol/L (ref 97–108)
Creatinine, Ser: 0.88 mg/dL (ref 0.76–1.27)
GFR, EST AFRICAN AMERICAN: 99 mL/min/{1.73_m2} (ref >59)
GFR, EST NON AFRICAN AMERICAN: 86 mL/min/{1.73_m2} (ref >59)
Globulin, Total: 2.5 g/dL (ref 1.5–4.5)
Glucose: 97 mg/dL (ref 65–99)
Potassium: 4.9 mmol/L (ref 3.5–5.2)
Sodium: 143 mmol/L (ref 134–144)
TOTAL PROTEIN: 6.7 g/dL (ref 6.0–8.5)

## 2015-02-15 LAB — CBC WITH DIFFERENTIAL/PLATELET
BASOS ABS: 0.1 10*3/uL (ref 0.0–0.2)
Basos: 1 %
EOS (ABSOLUTE): 0.2 10*3/uL (ref 0.0–0.4)
Eos: 2 %
Hematocrit: 45.1 % (ref 37.5–51.0)
Hemoglobin: 15.3 g/dL (ref 12.6–17.7)
Immature Grans (Abs): 0 10*3/uL (ref 0.0–0.1)
Immature Granulocytes: 0 %
Lymphocytes Absolute: 1.1 10*3/uL (ref 0.7–3.1)
Lymphs: 12 %
MCH: 30.5 pg (ref 26.6–33.0)
MCHC: 33.9 g/dL (ref 31.5–35.7)
MCV: 90 fL (ref 79–97)
MONOS ABS: 1.3 10*3/uL — AB (ref 0.1–0.9)
Monocytes: 14 %
Neutrophils Absolute: 6.6 10*3/uL (ref 1.4–7.0)
Neutrophils: 71 %
Platelets: 205 10*3/uL (ref 150–379)
RBC: 5.01 x10E6/uL (ref 4.14–5.80)
RDW: 14.1 % (ref 12.3–15.4)
WBC: 9.4 10*3/uL (ref 3.4–10.8)

## 2015-02-15 LAB — PSA: Prostate Specific Ag, Serum: 1.5 ng/mL (ref 0.0–4.0)

## 2015-02-15 LAB — LIPID PANEL W/O CHOL/HDL RATIO
CHOLESTEROL TOTAL: 137 mg/dL (ref 100–199)
HDL: 36 mg/dL — ABNORMAL LOW (ref >39)
LDL Calculated: 69 mg/dL (ref 0–99)
Triglycerides: 161 mg/dL — ABNORMAL HIGH (ref 0–149)
VLDL Cholesterol Cal: 32 mg/dL (ref 5–40)

## 2015-02-15 LAB — HGB A1C W/O EAG: Hgb A1c MFr Bld: 6.1 % — ABNORMAL HIGH (ref 4.8–5.6)

## 2015-02-24 ENCOUNTER — Telehealth: Payer: Self-pay | Admitting: Family Medicine

## 2015-02-24 DIAGNOSIS — R748 Abnormal levels of other serum enzymes: Secondary | ICD-10-CM

## 2015-02-24 NOTE — Telephone Encounter (Signed)
I will call pt about labs A1C 6.1 Cholesterol is great Anemia resolved Alk phos mildly elevated Rest of CMP is normal

## 2015-03-05 DIAGNOSIS — R748 Abnormal levels of other serum enzymes: Secondary | ICD-10-CM | POA: Insufficient documentation

## 2015-03-05 NOTE — Telephone Encounter (Signed)
I spoke with patient about labs; no nausea, no abd pain; come in for repeat lab in one month, NOT fasting Other labs discussed

## 2015-04-01 ENCOUNTER — Ambulatory Visit (INDEPENDENT_AMBULATORY_CARE_PROVIDER_SITE_OTHER): Payer: Medicare Other | Admitting: Nurse Practitioner

## 2015-04-01 ENCOUNTER — Other Ambulatory Visit: Payer: Medicare Other

## 2015-04-01 ENCOUNTER — Encounter: Payer: Self-pay | Admitting: Nurse Practitioner

## 2015-04-01 VITALS — BP 152/84 | HR 62 | Ht 63.5 in | Wt 166.1 lb

## 2015-04-01 DIAGNOSIS — E785 Hyperlipidemia, unspecified: Secondary | ICD-10-CM | POA: Diagnosis not present

## 2015-04-01 DIAGNOSIS — I4892 Unspecified atrial flutter: Secondary | ICD-10-CM | POA: Diagnosis not present

## 2015-04-01 DIAGNOSIS — I10 Essential (primary) hypertension: Secondary | ICD-10-CM | POA: Diagnosis not present

## 2015-04-01 MED ORDER — AMLODIPINE BESYLATE 2.5 MG PO TABS: 2.5000 mg | ORAL_TABLET | Freq: Every day | ORAL | Status: DC

## 2015-04-01 NOTE — Progress Notes (Signed)
Patient Name: Jason Malone Date of Encounter: 04/01/2015  Primary Care Provider:  [name], MD Primary Cardiologist:  Johnny Bridge, MD   Chief Complaint  74 year old male with a history of lung cancer and paroxysmal atrial flutter who presents for follow-up today.  Past Medical History   Past Medical History  Diagnosis Date  . Full dentures   . Wears glasses   . Enlarged prostate   . Hypomagnesemia   . H/O hypokalemia   . Diverticulitis   . Hyperlipemia   . Essential hypertension   . Paroxysmal atrial flutter (HCC)     a. 03/2013->no recurrence;  b. CHA2DS2VASc = 2-->not currently on anticoagulation;  c. 05/2012 Echo: EF 55-60%, normal RV.  . Lung cancer ([facility])     a. carcinoid, left lung, Stage 1b (T2a, N0, cM0);  b. 05/2013 s/p VATS & LULobectomy.  . Allergic rhinitis   . IFG (impaired fasting glucose)   . Monocytosis   . Benign prostatic hypertrophy with lower urinary tract symptoms (LUTS)   . Chest pain     a. 05/2013 MV: EF 70%, no ischemia.   Past Surgical History  Procedure Laterality Date  . Multiple tooth extractions    . Tonsillectomy    . Colonoscopy w/ polypectomy      bleed after-had to go to surgery to stop bleeding via colonoscopy  . Dupuytren contracture release  12/15/2011    Procedure: DUPUYTREN CONTRACTURE RELEASE;  Surgeon: [name], MD;  Location: [facility];  Service: Orthopedics;  Laterality: Left;  Fasciotomy left ring finger dupuytrens  . Bronchoscopy      04/06/2013  . Vasectomy    . Video bronchoscopy N/A 06/04/2013    Procedure: VIDEO BRONCHOSCOPY;  Surgeon: [name], MD;  Location: [facility] OR;  Service: Thoracic;  Laterality: N/A;  . Video assisted thoracoscopy (vats)/wedge resection Left 06/04/2013    Procedure: VIDEO ASSISTED THORACOSCOPY (VATS)/WEDGE RESECTION;  Surgeon: [name], MD;  Location: [facility];  Service: Thoracic;  Laterality: Left;  . Cardiovascular stress test  05/2013    a. No evidence of ischemia  or infarct, EF 70%, no WMAs  . Lung lobectomy      upper left lung    Allergies  Allergies  Allergen Reactions  . Latex Itching  . Tape Itching    Surgical tapes     HPI45 year old male with above compact problem list. He has a history of paroxysmal atrial flutter dating back to 2014, which has been managed with oral beta blocker therapy. He is not on oral anticoagulation. He also has a history of carcinoid lung tumor status post VATS and left upper lobectomy in January 2015. He underwent stress testing preoperatively which was normal. He was last seen in clinic here in October 2015. Since then, he has done well. He does have some degree of chronic dyspnea on exertion but denies any history of chest pain, PND, orthopnea, dizziness, significant edema, or early satiety. Further, he denies palpitations or recurrence of atrial flutter. He and his wife monitor his blood pressure at home fairly closely and have noted in the past 2 months he has typically been trending in the 150s to 160s. He frequently has cold cuts for dinner and also eats out a fair amount. He admits to salting his food though less than what he used to.  Home Medications  Prior to Admission medications   Medication Sig Start Date End Date Taking? Authorizing Provider  b complex vitamins capsule  Take 1 capsule by mouth daily.   Yes Historical Provider, MD  carvedilol (COREG) 3.125 MG tablet Take 1 tablet (3.125 mg total) by mouth 2 (two) times daily. 12/21/13  Yes Minna Merritts, MD  finasteride (PROSCAR) 5 MG tablet TAKE ONE TABLET BY MOUTH ONCE DAILY 01/23/15  Yes Arnetha Courser, MD  losartan (COZAAR) 100 MG tablet Take 1 tablet (100 mg total) by mouth daily. 12/13/14  Yes Minna Merritts, MD  Multiple Vitamins-Minerals (MULTIVITAMIN WITH MINERALS) tablet Take 1 tablet by mouth daily.   Yes Historical Provider, MD  simvastatin (ZOCOR) 40 MG tablet Take 1 tablet (40 mg total) by mouth at bedtime. 02/14/15  Yes Arnetha Courser, MD    tamsulosin (FLOMAX) 0.4 MG CAPS capsule Take 1 capsule (0.4 mg total) by mouth daily. 02/14/15  Yes Arnetha Courser, MD  vitamin C (ASCORBIC ACID) 500 MG tablet Take 500 mg by mouth daily.   Yes Historical Provider, MD  amLODipine (NORVASC) 2.5 MG tablet Take 1 tablet (2.5 mg total) by mouth daily. 04/01/15   Rogelia Mire, NP    Review of Systems  As above, he has some degree of chronic dyspnea on exertion ever since his left upper lobectomy. He has not been having palpitations, chest pain, PND, orthopnea, dizziness, syncope, edema, or early satiety.  All other systems reviewed and are otherwise negative except as noted above.  Physical Exam  VS:  BP 152/84 mmHg  Pulse 62  Ht 5' 3.5" (1.613 m)  Wt 166 lb 1.9 oz (75.352 kg)  BMI 28.96 kg/m2 , BMI Body mass index is 28.96 kg/(m^2). GEN: Well nourished, well developed, in no acute distress. HEENT: normal. Neck: Supple, no JVD, carotid bruits, or masses. Cardiac: RRR, no murmurs, rubs, or gallops. No clubbing, cyanosis, edema.  Radials/DP/PT 2+ and equal bilaterally.  Respiratory:  Respirations regular and unlabored, diminished breath sounds LUL, otw clear to auscultation bilaterally. GI: Soft, nontender, nondistended, BS + x 4. MS: no deformity or atrophy. Skin: warm and dry, no rash. Neuro:  Strength and sensation are intact. Psych: Normal affect.  Accessory Clinical Findings  ECG - regular sinus rhythm, 62, no acute ST or T changes.  Assessment & Plan  1.   Paroxysmal atrial flutter: This was diagnosed in November 2014. He has had no recurrence and remains on beta blocker therapy. He has never been on oral anticoagulation. CHA2DS2VASc equals 2. Continue beta blocker therapy.  He had previously been on amiodarone which was discontinued secondary to bradycardia. Beta blocker dose was also reduced in that setting in the past.  2. Essential hypertension: Blood pressures been running in the 150s to 160s at home. He is 152/84  today. With baseline bradycardia and prior history of more significant bradycardia I will not titrate beta blocker therapy. He is on maximum dose losartan. I'm adding amlodipine 2.5 mg daily to his current regimen and have advised him to call back in one week if blood pressure remains elevated at which point, we can increase to 5 mg daily.  3. Hyperlipidemia: He is on simvastatin 40 mg daily. LDL was 69 on 02/14/2015. LFTs were normal at that time.  4. History of carcinoid tumor/left lung cancer: Status post VATS and left upper lobectomy in general 2015. He is followed by Dr. Servando Snare with stable chest CT in August 2016.   Murray Hodgkins, NP 04/01/2015, 11:28 AM

## 2015-04-01 NOTE — Patient Instructions (Signed)
Medication Instructions:  Your physician has recommended you make the following change in your medication:  START taking amlodipine 2.'5mg'$  once per day   Labwork: none  Testing/Procedures: none  Follow-Up: Your physician wants you to follow-up in: one year with Dr. Rockey Situ. You will receive a reminder letter in the mail two months in advance. If you don't receive a letter, please call our office to schedule the follow-up appointment.   Any Other Special Instructions Will Be Listed Below (If Applicable).     If you need a refill on your cardiac medications before your next appointment, please call your pharmacy.

## 2015-04-02 ENCOUNTER — Other Ambulatory Visit: Payer: Medicare Other

## 2015-04-02 DIAGNOSIS — R748 Abnormal levels of other serum enzymes: Secondary | ICD-10-CM

## 2015-04-03 LAB — HEPATIC FUNCTION PANEL
ALBUMIN: 4.2 g/dL (ref 3.5–4.8)
ALT: 10 IU/L (ref 0–44)
AST: 15 IU/L (ref 0–40)
Alkaline Phosphatase: 116 IU/L (ref 39–117)
BILIRUBIN TOTAL: 0.5 mg/dL (ref 0.0–1.2)
Bilirubin, Direct: 0.14 mg/dL (ref 0.00–0.40)
Total Protein: 6.7 g/dL (ref 6.0–8.5)

## 2015-04-30 ENCOUNTER — Other Ambulatory Visit: Payer: Self-pay | Admitting: Family Medicine

## 2015-04-30 NOTE — Telephone Encounter (Signed)
Routing to provider  

## 2015-06-04 ENCOUNTER — Other Ambulatory Visit: Payer: Self-pay | Admitting: Cardiothoracic Surgery

## 2015-06-04 DIAGNOSIS — C3490 Malignant neoplasm of unspecified part of unspecified bronchus or lung: Secondary | ICD-10-CM

## 2015-06-05 DIAGNOSIS — H2513 Age-related nuclear cataract, bilateral: Secondary | ICD-10-CM | POA: Diagnosis not present

## 2015-06-20 DIAGNOSIS — H2513 Age-related nuclear cataract, bilateral: Secondary | ICD-10-CM | POA: Diagnosis not present

## 2015-06-26 ENCOUNTER — Ambulatory Visit
Admission: RE | Admit: 2015-06-26 | Discharge: 2015-06-26 | Disposition: A | Discharge status: Home / Self Care | Payer: Medicare Other | Source: Ambulatory Visit | Attending: Cardiothoracic Surgery | Admitting: Cardiothoracic Surgery

## 2015-06-26 ENCOUNTER — Ambulatory Visit (INDEPENDENT_AMBULATORY_CARE_PROVIDER_SITE_OTHER): Payer: Medicare Other | Admitting: Cardiothoracic Surgery

## 2015-06-26 ENCOUNTER — Encounter: Payer: Self-pay | Admitting: Cardiothoracic Surgery

## 2015-06-26 VITALS — BP 152/76 | HR 53 | Resp 16 | Ht 63.5 in | Wt 165.0 lb

## 2015-06-26 DIAGNOSIS — Z85118 Personal history of other malignant neoplasm of bronchus and lung: Secondary | ICD-10-CM | POA: Diagnosis not present

## 2015-06-26 DIAGNOSIS — C3490 Malignant neoplasm of unspecified part of unspecified bronchus or lung: Secondary | ICD-10-CM

## 2015-06-26 DIAGNOSIS — Z902 Acquired absence of lung [part of]: Secondary | ICD-10-CM

## 2015-06-26 DIAGNOSIS — C3402 Malignant neoplasm of left main bronchus: Secondary | ICD-10-CM | POA: Diagnosis not present

## 2015-06-26 NOTE — Progress Notes (Signed)
[address]       [place] [address]             [phone]      Erlin W [facility] Medical Record #[id number] Date of Birth: 17-Jan-1941  Referring: Nestor Lewandowsky, MD Primary Care: [name], MD  Chief Complaint:   POST OP FOLLOW UP 06/04/2013  OPERATIVE REPORT  PREOPERATIVE DIAGNOSIS: Large left hilar carcinoid tumor.  POSTOPERATIVE DIAGNOSIS: Large left hilar carcinoid tumor.  SURGICAL PROCEDURE: Bronchoscopy, left video-assisted thoracoscopy,  minithoracotomy, left upper lobectomy with lymph node dissection, re-  resection of the bronchial stump, and placement of On-Q device.  Mediastinal lymph node dissection.  SURGEON: Lanelle Bal, MD  Lung cancer   Primary site: Lung (Left)   Staging method: AJCC 7th Edition   Pathologic free text: well differentiated Neuroendocrine Tumor (carcinoid)   Pathologic: Stage IB (T2a, N0, cM0) signed by [name], MD on 06/06/2013  9:21 AM   Summary: Stage IB (T2a, N0, cM0)  History of Present Illness:     Patient continues to do well postoperatively. He is not limited by shortness of breath. He has occasional sharp pains in his left chest wall with movement.  No hemoptysis. Patient comes in today for follow-up CT scan now approximate 2 years after surgical resection of a large left hilar carcinoid tumor  Zubrod Score: At the time of surgery this patient's most appropriate activity status/level should be described as: '[x]'$     0    Normal activity, no symptoms '[]'$     1    Restricted in physical strenuous activity but ambulatory, able to do out light work '[]'$     2    Ambulatory and capable of self care, unable to do work activities, up and about >50 % of waking hours                              '[]'$     3    Only limited self care, in bed greater than 50% of waking hours '[]'$     4    Completely disabled, no self care, confined to bed or chair '[]'$     5    Moribund   Past Medical History  Diagnosis Date    . Full dentures   . Wears glasses   . Enlarged prostate   . Hypomagnesemia   . H/O hypokalemia   . Diverticulitis   . Hyperlipemia   . Essential hypertension   . Paroxysmal atrial flutter (HCC)     a. 03/2013->no recurrence;  b. CHA2DS2VASc = 2-->not currently on anticoagulation;  c. 05/2012 Echo: EF 55-60%, normal RV.  . Lung cancer ([facility])     a. carcinoid, left lung, Stage 1b (T2a, N0, cM0);  b. 05/2013 s/p VATS & LULobectomy.  . Allergic rhinitis   . IFG (impaired fasting glucose)   . Monocytosis   . Benign prostatic hypertrophy with lower urinary tract symptoms (LUTS)   . Chest pain     a. 05/2013 MV: EF 70%, no ischemia.     History  Smoking status  . Former Smoker -- 1.00 packs/day for 40 years  . Types: Cigarettes  . Quit date: 12/09/1998  Smokeless tobacco  . Never Used    History  Alcohol Use No     Allergies  Allergen Reactions  . Latex Itching  . Tape Itching    Surgical tapes  Current Outpatient Prescriptions  Medication Sig Dispense Refill  . amLODipine (NORVASC) 2.5 MG tablet Take 1 tablet (2.5 mg total) by mouth daily. 90 tablet 3  . b complex vitamins capsule Take 1 capsule by mouth daily.    . carvedilol (COREG) 3.125 MG tablet Take 1 tablet (3.125 mg total) by mouth 2 (two) times daily. 180 tablet 3  . Cinnamon 500 MG capsule Take 500 mg by mouth daily.    . finasteride (PROSCAR) 5 MG tablet TAKE ONE TABLET BY MOUTH ONCE DAILY 90 tablet 1  . losartan (COZAAR) 100 MG tablet Take 1 tablet (100 mg total) by mouth daily. 90 tablet 3  . Multiple Vitamins-Minerals (MULTIVITAMIN WITH MINERALS) tablet Take 1 tablet by mouth daily.    [name] OVER THE COUNTER MEDICATION tumeric    . polycarbophil (FIBERCON) 625 MG tablet Take 500 mg by mouth daily.    . simvastatin (ZOCOR) 40 MG tablet Take 1 tablet (40 mg total) by mouth at bedtime. 90 tablet 1  . tamsulosin (FLOMAX) 0.4 MG CAPS capsule Take 1 capsule (0.4 mg total) by mouth daily. 90 capsule 3  . vitamin C  (ASCORBIC ACID) 500 MG tablet Take 500 mg by mouth daily.     No current facility-administered medications for this visit.       Physical Exam: BP 152/76 mmHg  Pulse 53  Resp 16  Ht 5' 3.5" (1.613 m)  Wt 165 lb (74.844 kg)  BMI 28.77 kg/m2  SpO2 97%  General appearance: alert and cooperative Neurologic: intact Heart: regular rate and rhythm, S1, S2 normal, no murmur, click, rub or gallop Lungs: clear to auscultation bilaterally Abdomen: soft, non-tender; bowel sounds normal; no masses,  no organomegaly Extremities: extremities normal, atraumatic, no cyanosis or edema and Homans sign is negative, no sign of DVT Wound: Patient's port sites and incision are well-healed He has no cervical or supraclavicular or axillary  adenopathy  Diagnostic Studies & Laboratory data:     Recent Radiology Findings:  Ct Chest Wo Contrast  06/26/2015  CLINICAL DATA:  History of carcinoid of the LEFT lung. EXAM: CT CHEST WITHOUT CONTRAST TECHNIQUE: Multidetector CT imaging of the chest was performed following the standard protocol without IV contrast. COMPARISON:  12/12/2014 FINDINGS: Mediastinum/Nodes: No axillary or supraclavicular lymphadenopathy. No mediastinal hilar adenopathy. No pericardial fluid. Esophagus normal. Lungs/Pleura: Post postsurgical change in the LEFT hemi thorax consistent upper lobectomy. No nodularity in the LEFT lung. Calcified nodule in the RIGHT upper lobe. Airways are normal. Upper abdomen: No focal hepatic lesions noncontrast exam. Several small gallstones within a normal caliber gallbladder. Adrenal glands normal. Musculoskeletal: No aggressive osseous lesion. IMPRESSION: 1. Postsurgical change in the LEFT hemi thorax. 2. No suspicious pulmonary nodularity. Electronically Signed   By: Suzy Bouchard M.D.   On: 06/26/2015 13:01    Ct Chest Wo Contrast  06/04/2014   CLINICAL DATA:  Lung cancer status post partial left lung resection 1 year ago. Incisional chest pain.  Subsequent encounter.  EXAM: CT CHEST WITHOUT CONTRAST  TECHNIQUE: Multidetector CT imaging of the chest was performed following the standard protocol without IV contrast.  COMPARISON:  Chest CT 11/20/2013.  PET-CT 04/17/2013.  FINDINGS: Mediastinum/Nodes: There are no enlarged mediastinal, hilar or axillary lymph nodes. The thyroid gland, trachea and esophagus demonstrate no significant findings. The heart size is normal. There is no pericardial effusion.There is diffuse atherosclerosis of the aorta, great vessels and coronary arteries. Contour irregularity of the aortic arch is stable.  Lungs/Pleura: There is no  pleural effusion. There are stable postsurgical changes status post left upper lobe resection. There is stable distortion of the left hilum and left mainstem bronchus.  Upper abdomen: Hepatic steatosis and cholelithiasis again noted. There is mild renal cortical thinning. No adrenal mass.  Musculoskeletal/Chest wall: There is no chest wall mass or suspicious osseous finding. Stable thoracotomy changes on the left.  IMPRESSION: 1. Stable postoperative chest CT. 2. There is stable postsurgical distortion of the left hilum and mainstem bronchus. No evidence of local recurrence or metastatic disease. 3. Cholelithiasis.   Electronically Signed   By: Camie Patience M.D.   On: 06/04/2014 14:39    I have independently reviewed the above radiology studies  and reviewed the findings with the patient.   Recent Lab Findings: Lab Results  Component Value Date   WBC 9.4 02/14/2015   HGB 12.0* 06/08/2013   HCT 45.1 02/14/2015   PLT 205 02/14/2015   GLUCOSE 97 02/14/2015   CHOL 137 02/14/2015   TRIG 161* 02/14/2015   HDL 36* 02/14/2015   LDLCALC 69 02/14/2015   ALT 10 04/02/2015   AST 15 04/02/2015   NA 143 02/14/2015   K 4.9 02/14/2015   CL 102 02/14/2015   CREATININE 0.88 02/14/2015   BUN 12 02/14/2015   CO2 25 02/14/2015   TSH 4.221 06/08/2013   INR 0.99 05/31/2013   HGBA1C 6.1* 02/14/2015       Assessment / Plan:   Well differentiated neuroendocrine tumor/ carcinoid stage IB patient is doing well postoperatively now 2 years postop We continue recommendation of  multidisciplinary thoracic oncology conference, no further treatment other than observation  CT scan done today of the chest shows no evidence of recurrent lung mass. Ct scan  Dx of gallstones- no symptoms We'll refer the patient to the lung cancer screening clinic for CT scan next year, as he was a long-term smoker greater than 40-pack-year history though he is no longer smoking. I'll plan to see the patient back as needed depending on any changes on the CT scans done through the lung cancer screening.  [name] MD      [address] [place] [address] Office [phone]   Beeper [phone]  06/26/2015 2:32 PM

## 2015-07-03 DIAGNOSIS — H2513 Age-related nuclear cataract, bilateral: Secondary | ICD-10-CM | POA: Diagnosis not present

## 2015-07-04 ENCOUNTER — Encounter: Payer: Self-pay | Admitting: *Deleted

## 2015-07-11 NOTE — Discharge Instructions (Signed)

## 2015-07-14 ENCOUNTER — Ambulatory Visit: Payer: Medicare Other | Admitting: Anesthesiology

## 2015-07-14 ENCOUNTER — Ambulatory Visit
Admission: RE | Admit: 2015-07-14 | Discharge: 2015-07-14 | Disposition: A | Discharge status: Home / Self Care | Payer: Medicare Other | Source: Ambulatory Visit | Attending: Ophthalmology | Admitting: Ophthalmology

## 2015-07-14 ENCOUNTER — Encounter
Admission: RE | Disposition: A | Discharge status: Home / Self Care | Payer: Self-pay | Source: Ambulatory Visit | Attending: Ophthalmology

## 2015-07-14 DIAGNOSIS — Z91040 Latex allergy status: Secondary | ICD-10-CM | POA: Diagnosis not present

## 2015-07-14 DIAGNOSIS — I10 Essential (primary) hypertension: Secondary | ICD-10-CM | POA: Diagnosis not present

## 2015-07-14 DIAGNOSIS — E7800 Pure hypercholesterolemia, unspecified: Secondary | ICD-10-CM | POA: Diagnosis not present

## 2015-07-14 DIAGNOSIS — Z85118 Personal history of other malignant neoplasm of bronchus and lung: Secondary | ICD-10-CM | POA: Insufficient documentation

## 2015-07-14 DIAGNOSIS — H2512 Age-related nuclear cataract, left eye: Secondary | ICD-10-CM | POA: Insufficient documentation

## 2015-07-14 DIAGNOSIS — H2513 Age-related nuclear cataract, bilateral: Secondary | ICD-10-CM | POA: Diagnosis not present

## 2015-07-14 DIAGNOSIS — Z79899 Other long term (current) drug therapy: Secondary | ICD-10-CM | POA: Diagnosis not present

## 2015-07-14 DIAGNOSIS — Z9889 Other specified postprocedural states: Secondary | ICD-10-CM | POA: Insufficient documentation

## 2015-07-14 DIAGNOSIS — H269 Unspecified cataract: Secondary | ICD-10-CM | POA: Diagnosis present

## 2015-07-14 HISTORY — PX: CATARACT EXTRACTION W/PHACO: SHX586

## 2015-07-14 SURGERY — PHACOEMULSIFICATION, CATARACT, WITH IOL INSERTION
Anesthesia: Monitor Anesthesia Care | Site: Eye | Laterality: Left | Wound class: Clean

## 2015-07-14 MED ORDER — EPINEPHRINE HCL 1 MG/ML IJ SOLN
INTRAOCULAR | Status: DC | PRN
  Administered 2015-07-14: 09:00:00 via OPHTHALMIC

## 2015-07-14 MED ORDER — MIDAZOLAM HCL 2 MG/2ML IJ SOLN
INTRAMUSCULAR | Status: DC | PRN
  Administered 2015-07-14 (×2): 1 mg via INTRAVENOUS

## 2015-07-14 MED ORDER — ARMC OPHTHALMIC DILATING GEL
1.0000 "application " | OPHTHALMIC | Status: DC | PRN
  Administered 2015-07-14 (×2): 1 via OPHTHALMIC

## 2015-07-14 MED ORDER — BRIMONIDINE TARTRATE 0.2 % OP SOLN
OPHTHALMIC | Status: DC | PRN
  Administered 2015-07-14: 1 [drp] via OPHTHALMIC

## 2015-07-14 MED ORDER — NA HYALUR & NA CHOND-NA HYALUR 0.4-0.35 ML IO KIT
PACK | INTRAOCULAR | Status: DC | PRN
  Administered 2015-07-14: 1 mL via INTRAOCULAR

## 2015-07-14 MED ORDER — FENTANYL CITRATE (PF) 100 MCG/2ML IJ SOLN
INTRAMUSCULAR | Status: DC | PRN
  Administered 2015-07-14: 50 ug via INTRAVENOUS

## 2015-07-14 MED ORDER — LACTATED RINGERS IV SOLN: INTRAVENOUS | Status: DC

## 2015-07-14 MED ORDER — POVIDONE-IODINE 5 % OP SOLN
1.0000 "application " | OPHTHALMIC | Status: DC | PRN
  Administered 2015-07-14: 1 via OPHTHALMIC

## 2015-07-14 MED ORDER — TETRACAINE HCL 0.5 % OP SOLN
1.0000 [drp] | OPHTHALMIC | Status: DC | PRN
  Administered 2015-07-14: 1 [drp] via OPHTHALMIC

## 2015-07-14 MED ORDER — CEFUROXIME OPHTHALMIC INJECTION 1 MG/0.1 ML
INJECTION | OPHTHALMIC | Status: DC | PRN
  Administered 2015-07-14: 0.1 mL via INTRACAMERAL

## 2015-07-14 MED ORDER — TIMOLOL MALEATE 0.5 % OP SOLN
OPHTHALMIC | Status: DC | PRN
  Administered 2015-07-14: 1 [drp] via OPHTHALMIC

## 2015-07-14 SURGICAL SUPPLY — 27 items
CANNULA ANT/CHMB 27GA (MISCELLANEOUS) ×3 IMPLANT
CARTRIDGE ABBOTT (MISCELLANEOUS) ×3 IMPLANT
GLOVE SURG LX 7.5 STRW (GLOVE) ×2
GLOVE SURG LX STRL 7.5 STRW (GLOVE) ×1 IMPLANT
GLOVE SURG TRIUMPH 8.0 PF LTX (GLOVE) ×3 IMPLANT
GOWN STRL REUS W/ TWL LRG LVL3 (GOWN DISPOSABLE) ×2 IMPLANT
GOWN STRL REUS W/TWL LRG LVL3 (GOWN DISPOSABLE) ×4
LENS IOL TECNIS 22.5 (Intraocular Lens) ×3 IMPLANT
LENS IOL TECNIS MONO 1P 22.5 (Intraocular Lens) ×1 IMPLANT
MARKER SKIN DUAL TIP RULER LAB (MISCELLANEOUS) ×3 IMPLANT
NDL RETROBULBAR .5 NSTRL (NEEDLE) IMPLANT
NEEDLE FILTER BLUNT 18X 1/2SAF (NEEDLE) ×2
NEEDLE FILTER BLUNT 18X1 1/2 (NEEDLE) ×1 IMPLANT
PACK CATARACT BRASINGTON (MISCELLANEOUS) ×3 IMPLANT
PACK EYE AFTER SURG (MISCELLANEOUS) ×3 IMPLANT
PACK OPTHALMIC (MISCELLANEOUS) ×3 IMPLANT
RING MALYGIN 7.0 (MISCELLANEOUS) IMPLANT
SUT ETHILON 10-0 CS-B-6CS-B-6 (SUTURE)
SUT VICRYL  9 0 (SUTURE)
SUT VICRYL 9 0 (SUTURE) IMPLANT
SUTURE EHLN 10-0 CS-B-6CS-B-6 (SUTURE) IMPLANT
SYR 3ML LL SCALE MARK (SYRINGE) ×3 IMPLANT
SYR 5ML LL (SYRINGE) IMPLANT
SYR TB 1ML LUER SLIP (SYRINGE) ×3 IMPLANT
WATER STERILE IRR 250ML POUR (IV SOLUTION) ×3 IMPLANT
WATER STERILE IRR 500ML POUR (IV SOLUTION) IMPLANT
WIPE NON LINTING 3.25X3.25 (MISCELLANEOUS) ×3 IMPLANT

## 2015-07-14 NOTE — Anesthesia Preprocedure Evaluation (Signed)
Anesthesia Evaluation  Patient identified by MRN, date of birth, ID band Patient awake    Reviewed: Allergy & Precautions, H&P , NPO status , Patient's Chart, lab work & pertinent test results, reviewed documented beta blocker date and time   Airway Mallampati: I  TM Distance: >3 FB Neck ROM: full    Dental  (+) Upper Dentures, Lower Dentures   Pulmonary former smoker,    Pulmonary exam normal breath sounds clear to auscultation       Cardiovascular Exercise Tolerance: Good hypertension, Normal cardiovascular exam Rhythm:regular Rate:Normal     Neuro/Psych negative neurological ROS  negative psych ROS   GI/Hepatic negative GI ROS, Neg liver ROS,   Endo/Other  negative endocrine ROS  Renal/GU negative Renal ROS  negative genitourinary   Musculoskeletal   Abdominal   Peds  Hematology negative hematology ROS (+)   Anesthesia Other Findings   Reproductive/Obstetrics negative OB ROS                             Anesthesia Physical Anesthesia Plan  ASA: III  Anesthesia Plan: MAC   Post-op Pain Management:    Induction: Intravenous  Airway Management Planned: Nasal Cannula  Additional Equipment:   Intra-op Plan:   Post-operative Plan:   Informed Consent: I have reviewed the patients History and Physical, chart, labs and discussed the procedure including the risks, benefits and alternatives for the proposed anesthesia with the patient or authorized representative who has indicated his/her understanding and acceptance.   Dental Advisory Given  Plan Discussed with: CRNA  Anesthesia Plan Comments:         Anesthesia Quick Evaluation

## 2015-07-14 NOTE — H&P (Signed)
  The History and Physical notes are on paper, have been signed, and are to be scanned. The patient remains stable and unchanged from the H&P.   Previous H&P reviewed, patient examined, and there are no changes.  Jason Malone 07/14/2015 8:07 AM

## 2015-07-14 NOTE — Anesthesia Postprocedure Evaluation (Signed)
Anesthesia Post Note  Patient: Jason Malone  Procedure(s) Performed: Procedure(s) (LRB): CATARACT EXTRACTION PHACO AND INTRAOCULAR LENS PLACEMENT (IOC) (Left)  Patient location during evaluation: PACU Anesthesia Type: MAC Level of consciousness: awake and alert Pain management: pain level controlled Vital Signs Assessment: post-procedure vital signs reviewed and stable Respiratory status: spontaneous breathing, nonlabored ventilation and respiratory function stable Cardiovascular status: blood pressure returned to baseline and stable Postop Assessment: no signs of nausea or vomiting Anesthetic complications: no    Trecia Rogers

## 2015-07-14 NOTE — Op Note (Signed)
OPERATIVE NOTE  KAIREE ISA [id number] 07/14/2015   PREOPERATIVE DIAGNOSIS:  Nuclear sclerotic cataract left eye. H25.12   POSTOPERATIVE DIAGNOSIS:    Nuclear sclerotic cataract left eye.     PROCEDURE:  Phacoemusification with posterior chamber intraocular lens placement of the left eye   LENS:   Implant Name Type Inv. Item Serial No. Manufacturer Lot No. LRB No. Used  LENS IOL TECNIS 22.5 - [id number] Intraocular Lens LENS IOL TECNIS 22.5 [id number] AMO   Left 1        ULTRASOUND TIME: 10  % of 1 minutes 9 seconds, CDE 7.3  SURGEON:  Wyonia Hough, MD   ANESTHESIA:  Topical with tetracaine drops and 2% Xylocaine jelly.   COMPLICATIONS:  None.   DESCRIPTION OF PROCEDURE:  The patient was identified in the holding room and transported to the operating room and placed in the supine position under the operating microscope.  The left eye was identified as the operative eye and it was prepped and draped in the usual sterile ophthalmic fashion.   A 1 millimeter clear-corneal paracentesis was made at the 1:30 position.  The anterior chamber was filled with Viscoat viscoelastic.  A 2.4 millimeter keratome was used to make a near-clear corneal incision at the 10:30 position.  .  A curvilinear capsulorrhexis was made with a cystotome and capsulorrhexis forceps.  Balanced salt solution was used to hydrodissect and hydrodelineate the nucleus.   Phacoemulsification was then used in stop and chop fashion to remove the lens nucleus and epinucleus.  The remaining cortex was then removed using the irrigation and aspiration handpiece. Provisc was then placed into the capsular bag to distend it for lens placement.  A lens was then injected into the capsular bag.  The remaining viscoelastic was aspirated.   Wounds were hydrated with balanced salt solution.  The anterior chamber was inflated to a physiologic pressure with balanced salt solution.  No wound leaks were noted. Cefuroxime 0.1 ml of a  '10mg'$ /ml solution was injected into the anterior chamber for a dose of 1 mg of intracameral antibiotic at the completion of the case.   Timolol and Brimonidine drops were applied to the eye.  The patient was taken to the recovery room in stable condition without complications of anesthesia or surgery.  Maridee Slape 07/14/2015, 8:32 AM

## 2015-07-14 NOTE — Anesthesia Procedure Notes (Signed)
Procedure Name: MAC Performed by: Nat Christen Pre-anesthesia Checklist: Patient identified, Emergency Drugs available, Suction available, Timeout performed and Patient being monitored Patient Re-evaluated:Patient Re-evaluated prior to inductionOxygen Delivery Method: Nasal cannula Placement Confirmation: positive ETCO2

## 2015-07-14 NOTE — Transfer of Care (Signed)
Immediate Anesthesia Transfer of Care Note  Patient: Jason Malone  Procedure(s) Performed: Procedure(s): CATARACT EXTRACTION PHACO AND INTRAOCULAR LENS PLACEMENT (IOC) (Left)  Patient Location: PACU  Anesthesia Type: MAC  Level of Consciousness: awake, alert  and patient cooperative  Airway and Oxygen Therapy: Patient Spontanous Breathing and Patient connected to supplemental oxygen  Post-op Assessment: Post-op Vital signs reviewed, Patient's Cardiovascular Status Stable, Respiratory Function Stable, Patent Airway and No signs of Nausea or vomiting  Post-op Vital Signs: Reviewed and stable  Complications: No apparent anesthesia complications

## 2015-07-15 ENCOUNTER — Encounter: Payer: Self-pay | Admitting: Ophthalmology

## 2015-07-31 ENCOUNTER — Encounter: Payer: Self-pay | Admitting: *Deleted

## 2015-08-15 ENCOUNTER — Ambulatory Visit (INDEPENDENT_AMBULATORY_CARE_PROVIDER_SITE_OTHER): Payer: Medicare Other | Admitting: Family Medicine

## 2015-08-15 ENCOUNTER — Encounter: Payer: Self-pay | Admitting: Family Medicine

## 2015-08-15 VITALS — BP 118/68 | HR 60 | Temp 97.9°F | Resp 14 | Wt 167.0 lb

## 2015-08-15 DIAGNOSIS — Z5181 Encounter for therapeutic drug level monitoring: Secondary | ICD-10-CM

## 2015-08-15 DIAGNOSIS — J309 Allergic rhinitis, unspecified: Secondary | ICD-10-CM

## 2015-08-15 DIAGNOSIS — I10 Essential (primary) hypertension: Secondary | ICD-10-CM | POA: Diagnosis not present

## 2015-08-15 DIAGNOSIS — I4892 Unspecified atrial flutter: Secondary | ICD-10-CM

## 2015-08-15 DIAGNOSIS — R7301 Impaired fasting glucose: Secondary | ICD-10-CM | POA: Diagnosis not present

## 2015-08-15 DIAGNOSIS — K625 Hemorrhage of anus and rectum: Secondary | ICD-10-CM

## 2015-08-15 DIAGNOSIS — E785 Hyperlipidemia, unspecified: Secondary | ICD-10-CM

## 2015-08-15 NOTE — Assessment & Plan Note (Signed)
Goes to see Dr. [name] in June; patient thinks hemorrhoids; pt declined rectal exam; water and fiber

## 2015-08-15 NOTE — Assessment & Plan Note (Signed)
Check glucose and A1C today (fasting)

## 2015-08-15 NOTE — Assessment & Plan Note (Signed)
Plain antihistamine avoid decngestants

## 2015-08-15 NOTE — Assessment & Plan Note (Signed)
Check lipids today; limit eggs and cheese and saturated fats when able

## 2015-08-15 NOTE — Assessment & Plan Note (Signed)
Sees cardiologist; rate controlled

## 2015-08-15 NOTE — Assessment & Plan Note (Signed)
Controlled; DASH guidelines recommended

## 2015-08-15 NOTE — Progress Notes (Signed)
BP 118/68 mmHg  Pulse 60  Temp(Src) 97.9 F (36.6 C) (Oral)  Resp 14  Wt 167 lb (75.751 kg)  SpO2 97%   Subjective:    Patient ID: Jason Malone, male    DOB: [birth year], 75 y.o.   MRN: [id number]  HPI: Jason Malone is a 75 y.o. male  Chief Complaint  Patient presents with  . Medication Refill  . Follow-up    6 month  . Hypertension    see's cardio  . Hyperlipidemia  . Benign Prostatic Hypertrophy   HTN: varies at home, 134-120s and highest 146 or so; sees cardiologist Tries to stay away from salt; limiting potato chips; reads labels; no decongestants; might use plain loratidine or coricidin  High cholesterol; there is room for improvement in his diet, he explains; skinless [place] or chicken; some weeks will eat no eggs, some weeks four eggs; 2% milk; on statin, no muscle aches  BPH; prostate seems to be behaving itself; good stream and no dribbling; no blood in the urine  Prediabetes; last A1c 6.1 in October; likes sweets; no sweet tea; likes chocolate; likes white potatoes; does not like wheat bread  Relevant past medical, surgical, family and social history reviewed and updated as indicated. Interim medical history since our last visit reviewed. Allergies and medications reviewed and updated.  Review of Systems  Per HPI unless specifically indicated above     Objective:    BP 118/68 mmHg  Pulse 60  Temp(Src) 97.9 F (36.6 C) (Oral)  Resp 14  Wt 167 lb (75.751 kg)  SpO2 97%  Wt Readings from Last 3 Encounters:  08/15/15 167 lb (75.751 kg)  07/14/15 163 lb (73.936 kg)  06/26/15 165 lb (74.844 kg)    Physical Exam  Constitutional: He appears well-developed and well-nourished. No distress.  HENT:  Head: Normocephalic and atraumatic.  Eyes: EOM are normal. No scleral icterus.  Neck: No thyromegaly present.  Cardiovascular: Normal rate and regular rhythm.   Pulmonary/Chest: Effort normal and breath sounds normal.  Abdominal: Soft. Bowel sounds are  normal. He exhibits no distension.  Musculoskeletal: He exhibits no edema.  Neurological: Coordination normal.  Skin: Skin is warm and dry. No pallor.  Psychiatric: He has a normal mood and affect. His behavior is normal. Judgment and thought content normal.      Assessment & Plan:   Problem List Items Addressed This Visit      Cardiovascular and Mediastinum   Hypertension    Controlled; DASH guidelines recommended      Paroxysmal atrial flutter [facility])    Sees cardiologist; rate controlled        Respiratory   Allergic rhinitis    Plain antihistamine avoid decngestants        Digestive   Rectal bleeding    Goes to see Dr. [name] in June; patient thinks hemorrhoids; pt declined rectal exam; water and fiber      Relevant Orders   CBC with Differential/Platelet (Completed)     Endocrine   IFG (impaired fasting glucose)    Check glucose and A1C today (fasting)      Relevant Orders   Hgb A1c w/o eAG (Completed)     Other   Hyperlipemia - Primary    Check lipids today; limit eggs and cheese and saturated fats when able      Relevant Orders   Lipid Panel w/o Chol/HDL Ratio (Completed)   Medication monitoring encounter   Relevant Orders   Comprehensive metabolic panel (Completed)  Follow up plan: Return in about 6 months (around 02/14/2016) for follow-up with fasting labs.  Orders Placed This Encounter  Procedures  . CBC with Differential/Platelet  . Lipid Panel w/o Chol/HDL Ratio  . Comprehensive metabolic panel  . Hgb A1c w/o eAG   An after-visit summary was printed and given to the patient at [place].  Please see the patient instructions which may contain other information and recommendations beyond what is mentioned above in the assessment and plan.

## 2015-08-15 NOTE — Patient Instructions (Signed)
Return in 6 months We'll contact you about the labs If you have not heard anything from my staff in a week about any orders/referrals/studies from today, please contact us here to follow-up [phone] Plenty of fiber and water recommended Try to limit saturated fats in your diet (bologna, hot dogs, barbeque, cheeseburgers, hamburgers, steak, bacon, sausage, cheese, etc.) and get more fresh fruits, vegetables, and whole grains

## 2015-08-16 LAB — CBC WITH DIFFERENTIAL/PLATELET
BASOS: 1 %
Basophils Absolute: 0.1 10*3/uL (ref 0.0–0.2)
EOS (ABSOLUTE): 0.2 10*3/uL (ref 0.0–0.4)
Eos: 3 %
Hematocrit: 44.4 % (ref 37.5–51.0)
Hemoglobin: 15.2 g/dL (ref 12.6–17.7)
IMMATURE GRANS (ABS): 0 10*3/uL (ref 0.0–0.1)
Immature Granulocytes: 0 %
Lymphocytes Absolute: 1.1 10*3/uL (ref 0.7–3.1)
Lymphs: 14 %
MCH: 30.4 pg (ref 26.6–33.0)
MCHC: 34.2 g/dL (ref 31.5–35.7)
MCV: 89 fL (ref 79–97)
MONOS ABS: 0.9 10*3/uL (ref 0.1–0.9)
Monocytes: 11 %
NEUTROS ABS: 5.7 10*3/uL (ref 1.4–7.0)
Neutrophils: 71 %
PLATELETS: 217 10*3/uL (ref 150–379)
RBC: 5 x10E6/uL (ref 4.14–5.80)
RDW: 13.9 % (ref 12.3–15.4)
WBC: 8 10*3/uL (ref 3.4–10.8)

## 2015-08-16 LAB — COMPREHENSIVE METABOLIC PANEL
A/G RATIO: 1.8 (ref 1.2–2.2)
ALBUMIN: 4.4 g/dL (ref 3.5–4.8)
ALT: 15 IU/L (ref 0–44)
AST: 18 IU/L (ref 0–40)
Alkaline Phosphatase: 110 IU/L (ref 39–117)
BILIRUBIN TOTAL: 0.5 mg/dL (ref 0.0–1.2)
BUN / CREAT RATIO: 17 (ref 10–24)
BUN: 14 mg/dL (ref 8–27)
CALCIUM: 9.7 mg/dL (ref 8.6–10.2)
CHLORIDE: 102 mmol/L (ref 96–106)
CO2: 27 mmol/L (ref 18–29)
Creatinine, Ser: 0.84 mg/dL (ref 0.76–1.27)
GFR, EST AFRICAN AMERICAN: 101 mL/min/{1.73_m2} (ref >59)
GFR, EST NON AFRICAN AMERICAN: 87 mL/min/{1.73_m2} (ref >59)
GLOBULIN, TOTAL: 2.4 g/dL (ref 1.5–4.5)
Glucose: 102 mg/dL — ABNORMAL HIGH (ref 65–99)
Potassium: 5 mmol/L (ref 3.5–5.2)
SODIUM: 141 mmol/L (ref 134–144)
TOTAL PROTEIN: 6.8 g/dL (ref 6.0–8.5)

## 2015-08-16 LAB — LIPID PANEL W/O CHOL/HDL RATIO
Cholesterol, Total: 129 mg/dL (ref 100–199)
HDL: 40 mg/dL (ref >39)
LDL Calculated: 65 mg/dL (ref 0–99)
Triglycerides: 122 mg/dL (ref 0–149)
VLDL CHOLESTEROL CAL: 24 mg/dL (ref 5–40)

## 2015-08-16 LAB — HGB A1C W/O EAG: HEMOGLOBIN A1C: 6.3 % — AB (ref 4.8–5.6)

## 2015-08-19 ENCOUNTER — Telehealth: Payer: Self-pay | Admitting: Family Medicine

## 2015-08-19 NOTE — Telephone Encounter (Signed)
I talked w/pt about labs; prediabetes; monitor every 6 months; continue same meds; lipids excellent

## 2015-08-20 ENCOUNTER — Other Ambulatory Visit: Payer: Self-pay | Admitting: Family Medicine

## 2015-09-23 DIAGNOSIS — H2511 Age-related nuclear cataract, right eye: Secondary | ICD-10-CM | POA: Diagnosis not present

## 2015-09-26 NOTE — Discharge Instructions (Signed)

## 2015-10-01 ENCOUNTER — Ambulatory Visit
Admission: RE | Admit: 2015-10-01 | Discharge: 2015-10-01 | Disposition: A | Discharge status: Home / Self Care | Payer: Medicare Other | Source: Ambulatory Visit | Attending: Ophthalmology | Admitting: Ophthalmology

## 2015-10-01 ENCOUNTER — Ambulatory Visit: Payer: Medicare Other | Admitting: Anesthesiology

## 2015-10-01 ENCOUNTER — Encounter
Admission: RE | Disposition: A | Discharge status: Home / Self Care | Payer: Self-pay | Source: Ambulatory Visit | Attending: Ophthalmology

## 2015-10-01 DIAGNOSIS — I4891 Unspecified atrial fibrillation: Secondary | ICD-10-CM | POA: Diagnosis not present

## 2015-10-01 DIAGNOSIS — H2511 Age-related nuclear cataract, right eye: Secondary | ICD-10-CM | POA: Insufficient documentation

## 2015-10-01 DIAGNOSIS — Z85118 Personal history of other malignant neoplasm of bronchus and lung: Secondary | ICD-10-CM | POA: Diagnosis not present

## 2015-10-01 DIAGNOSIS — E7800 Pure hypercholesterolemia, unspecified: Secondary | ICD-10-CM | POA: Insufficient documentation

## 2015-10-01 DIAGNOSIS — Z902 Acquired absence of lung [part of]: Secondary | ICD-10-CM | POA: Insufficient documentation

## 2015-10-01 DIAGNOSIS — H2512 Age-related nuclear cataract, left eye: Secondary | ICD-10-CM | POA: Diagnosis not present

## 2015-10-01 DIAGNOSIS — Z91040 Latex allergy status: Secondary | ICD-10-CM | POA: Diagnosis not present

## 2015-10-01 DIAGNOSIS — I10 Essential (primary) hypertension: Secondary | ICD-10-CM | POA: Insufficient documentation

## 2015-10-01 DIAGNOSIS — Z87891 Personal history of nicotine dependence: Secondary | ICD-10-CM | POA: Insufficient documentation

## 2015-10-01 HISTORY — PX: CATARACT EXTRACTION W/PHACO: SHX586

## 2015-10-01 HISTORY — DX: Personal history of other diseases of the circulatory system: Z86.79

## 2015-10-01 SURGERY — PHACOEMULSIFICATION, CATARACT, WITH IOL INSERTION
Anesthesia: Monitor Anesthesia Care | Site: Eye | Laterality: Right | Wound class: Clean

## 2015-10-01 MED ORDER — POVIDONE-IODINE 5 % OP SOLN
1.0000 "application " | OPHTHALMIC | Status: DC | PRN
  Administered 2015-10-01: 1 via OPHTHALMIC

## 2015-10-01 MED ORDER — EPINEPHRINE HCL 1 MG/ML IJ SOLN
INTRAMUSCULAR | Status: DC | PRN
  Administered 2015-10-01: 11:00:00 via OPHTHALMIC

## 2015-10-01 MED ORDER — MIDAZOLAM HCL 2 MG/2ML IJ SOLN
INTRAMUSCULAR | Status: DC | PRN
  Administered 2015-10-01: 2 mg via INTRAVENOUS

## 2015-10-01 MED ORDER — LACTATED RINGERS IV SOLN: INTRAVENOUS | Status: DC

## 2015-10-01 MED ORDER — TETRACAINE HCL 0.5 % OP SOLN
1.0000 [drp] | OPHTHALMIC | Status: DC | PRN
  Administered 2015-10-01: 1 [drp] via OPHTHALMIC

## 2015-10-01 MED ORDER — TIMOLOL MALEATE 0.5 % OP SOLN
OPHTHALMIC | Status: DC | PRN
  Administered 2015-10-01: 1 [drp] via OPHTHALMIC

## 2015-10-01 MED ORDER — CEFUROXIME OPHTHALMIC INJECTION 1 MG/0.1 ML
INJECTION | OPHTHALMIC | Status: DC | PRN
  Administered 2015-10-01: 0.1 mL via OPHTHALMIC

## 2015-10-01 MED ORDER — NA HYALUR & NA CHOND-NA HYALUR 0.4-0.35 ML IO KIT
PACK | INTRAOCULAR | Status: DC | PRN
  Administered 2015-10-01: 1 mL via INTRAOCULAR

## 2015-10-01 MED ORDER — BRIMONIDINE TARTRATE 0.2 % OP SOLN
OPHTHALMIC | Status: DC | PRN
  Administered 2015-10-01: 1 [drp] via OPHTHALMIC

## 2015-10-01 MED ORDER — FENTANYL CITRATE (PF) 100 MCG/2ML IJ SOLN
INTRAMUSCULAR | Status: DC | PRN
Start: 2015-10-01 — End: 2015-10-01
  Administered 2015-10-01: 50 ug via INTRAVENOUS

## 2015-10-01 MED ORDER — BALANCED SALT IO SOLN
INTRAOCULAR | Status: DC | PRN
  Administered 2015-10-01: 1 mL via OPHTHALMIC

## 2015-10-01 MED ORDER — ARMC OPHTHALMIC DILATING GEL
1.0000 "application " | OPHTHALMIC | Status: DC | PRN
  Administered 2015-10-01 (×2): 1 via OPHTHALMIC

## 2015-10-01 SURGICAL SUPPLY — 24 items
CANNULA ANT/CHMB 27GA (MISCELLANEOUS) ×3 IMPLANT
CARTRIDGE ABBOTT (MISCELLANEOUS) IMPLANT
GLOVE SURG LX 7.5 STRW (GLOVE) ×2
GLOVE SURG LX STRL 7.5 STRW (GLOVE) ×1 IMPLANT
GLOVE SURG TRIUMPH 8.0 PF LTX (GLOVE) ×3 IMPLANT
GOWN STRL REUS W/ TWL LRG LVL3 (GOWN DISPOSABLE) ×2 IMPLANT
GOWN STRL REUS W/TWL LRG LVL3 (GOWN DISPOSABLE) ×4
LENS IOL TECNIS ITEC 23.0 (Intraocular Lens) ×3 IMPLANT
MARKER SKIN DUAL TIP RULER LAB (MISCELLANEOUS) ×3 IMPLANT
NDL RETROBULBAR .5 NSTRL (NEEDLE) IMPLANT
NEEDLE FILTER BLUNT 18X 1/2SAF (NEEDLE) ×4
NEEDLE FILTER BLUNT 18X1 1/2 (NEEDLE) ×2 IMPLANT
PACK CATARACT BRASINGTON (MISCELLANEOUS) ×3 IMPLANT
PACK EYE AFTER SURG (MISCELLANEOUS) ×3 IMPLANT
PACK OPTHALMIC (MISCELLANEOUS) ×3 IMPLANT
RING MALYGIN 7.0 (MISCELLANEOUS) IMPLANT
SUT ETHILON 10-0 CS-B-6CS-B-6 (SUTURE)
SUT VICRYL  9 0 (SUTURE)
SUT VICRYL 9 0 (SUTURE) IMPLANT
SUTURE EHLN 10-0 CS-B-6CS-B-6 (SUTURE) IMPLANT
SYR 3ML LL SCALE MARK (SYRINGE) ×6 IMPLANT
SYR TB 1ML LUER SLIP (SYRINGE) ×3 IMPLANT
WATER STERILE IRR 250ML POUR (IV SOLUTION) ×3 IMPLANT
WIPE NON LINTING 3.25X3.25 (MISCELLANEOUS) ×3 IMPLANT

## 2015-10-01 NOTE — Anesthesia Procedure Notes (Signed)
Procedure Name: MAC Performed by: Nikolay Demetriou Pre-anesthesia Checklist: Patient identified, Emergency Drugs available, Suction available, Timeout performed and Patient being monitored Patient Re-evaluated:Patient Re-evaluated prior to inductionOxygen Delivery Method: Nasal cannula Placement Confirmation: positive ETCO2       

## 2015-10-01 NOTE — Op Note (Signed)
LOCATION:  [place]   PREOPERATIVE DIAGNOSIS:    Nuclear sclerotic cataract right eye. H25.11   POSTOPERATIVE DIAGNOSIS:  Nuclear sclerotic cataract right eye.     PROCEDURE:  Phacoemusification with posterior chamber intraocular lens placement of the right eye   LENS:   Implant Name Type Inv. Item Serial No. Manufacturer Lot No. LRB No. Used  LENS IOL DIOP 23.0 - [id number] Intraocular Lens LENS IOL DIOP 23.0 [id number]   Right 1        ULTRASOUND TIME: 15 % of 1 minutes, 28 seconds.  CDE 13.4   SURGEON:  Wyonia Hough, MD   ANESTHESIA:  Topical with tetracaine drops and 2% Xylocaine jelly, augmented with 1% preservative-free intracameral lidocaine.    COMPLICATIONS:  None.   DESCRIPTION OF PROCEDURE:  The patient was identified in the holding room and transported to the operating room and placed in the supine position under the operating microscope.  The right eye was identified as the operative eye and it was prepped and draped in the usual sterile ophthalmic fashion.   A 1 millimeter clear-corneal paracentesis was made at the 12:00 position.  0.5 ml of preservative-free 1% lidocaine was injected into the anterior chamber. The anterior chamber was filled with Viscoat viscoelastic.  A 2.4 millimeter keratome was used to make a near-clear corneal incision at the 9:00 position.  A curvilinear capsulorrhexis was made with a cystotome and capsulorrhexis forceps.  Balanced salt solution was used to hydrodissect and hydrodelineate the nucleus.   Phacoemulsification was then used in stop and chop fashion to remove the lens nucleus and epinucleus.  The remaining cortex was then removed using the irrigation and aspiration handpiece. Provisc was then placed into the capsular bag to distend it for lens placement.  A lens was then injected into the capsular bag.  The remaining viscoelastic was aspirated.   Wounds were hydrated with balanced salt solution.  The anterior  chamber was inflated to a physiologic pressure with balanced salt solution.  No wound leaks were noted. Cefuroxime 0.1 ml of a '10mg'$ /ml solution was injected into the anterior chamber for a dose of 1 mg of intracameral antibiotic at the completion of the case.   Timolol and Brimonidine drops were applied to the eye.  The patient was taken to the recovery room in stable condition without complications of anesthesia or surgery.   Kirbie Stodghill 10/01/2015, 11:29 AM

## 2015-10-01 NOTE — Anesthesia Postprocedure Evaluation (Signed)
Anesthesia Post Note  Patient: Jason Malone  Procedure(s) Performed: Procedure(s) (LRB): CATARACT EXTRACTION PHACO AND INTRAOCULAR LENS PLACEMENT (IOC) (Right)  Patient location during evaluation: PACU Anesthesia Type: MAC Level of consciousness: awake and alert Pain management: pain level controlled Vital Signs Assessment: post-procedure vital signs reviewed and stable Respiratory status: spontaneous breathing, nonlabored ventilation, respiratory function stable and patient connected to nasal cannula oxygen Cardiovascular status: stable and blood pressure returned to baseline Anesthetic complications: no    Plummer Matich C

## 2015-10-01 NOTE — H&P (Signed)
  The History and Physical notes are on paper, have been signed, and are to be scanned. The patient remains stable and unchanged from the H&P.   Previous H&P reviewed, patient examined, and there are no changes.  Jason Malone 10/01/2015 9:59 AM

## 2015-10-01 NOTE — Anesthesia Preprocedure Evaluation (Addendum)
Anesthesia Evaluation    Airway Mallampati: II  TM Distance: >3 FB Neck ROM: Full    Dental no notable dental hx. (+) Edentulous Upper, Edentulous Lower   Pulmonary former smoker,  S/p lung ressection for cancer   Pulmonary exam normal breath sounds clear to auscultation       Cardiovascular hypertension, Normal cardiovascular exam+ dysrhythmias  Rhythm:Regular Rate:Normal  afib   Neuro/Psych    GI/Hepatic Neg liver ROS, diverticulitis   Endo/Other  negative endocrine ROS  Renal/GU negative Renal ROS     Musculoskeletal negative musculoskeletal ROS (+)   Abdominal   Peds  Hematology negative hematology ROS (+)   Anesthesia Other Findings   Reproductive/Obstetrics negative OB ROS                            Anesthesia Physical Anesthesia Plan  ASA: III  Anesthesia Plan: MAC   Post-op Pain Management:    Induction: Intravenous  Airway Management Planned:   Additional Equipment:   Intra-op Plan:   Post-operative Plan: Extubation in OR  Informed Consent: I have reviewed the patients History and Physical, chart, labs and discussed the procedure including the risks, benefits and alternatives for the proposed anesthesia with the patient or authorized representative who has indicated his/her understanding and acceptance.   Dental advisory given  Plan Discussed with: CRNA  Anesthesia Plan Comments:        Anesthesia Quick Evaluation

## 2015-10-01 NOTE — Transfer of Care (Signed)
Immediate Anesthesia Transfer of Care Note  Patient: Jason Malone  Procedure(s) Performed: Procedure(s): CATARACT EXTRACTION PHACO AND INTRAOCULAR LENS PLACEMENT (IOC) (Right)  Patient Location: PACU  Anesthesia Type: MAC  Level of Consciousness: awake, alert  and patient cooperative  Airway and Oxygen Therapy: Patient Spontanous Breathing and Patient connected to supplemental oxygen  Post-op Assessment: Post-op Vital signs reviewed, Patient's Cardiovascular Status Stable, Respiratory Function Stable, Patent Airway and No signs of Nausea or vomiting  Post-op Vital Signs: Reviewed and stable  Complications: No apparent anesthesia complications

## 2015-10-02 ENCOUNTER — Encounter: Payer: Self-pay | Admitting: Ophthalmology

## 2015-10-23 ENCOUNTER — Other Ambulatory Visit: Payer: Self-pay | Admitting: Family Medicine

## 2015-10-28 ENCOUNTER — Encounter: Payer: Self-pay | Admitting: General Surgery

## 2015-10-28 ENCOUNTER — Ambulatory Visit (INDEPENDENT_AMBULATORY_CARE_PROVIDER_SITE_OTHER): Payer: Medicare Other | Admitting: General Surgery

## 2015-10-28 VITALS — BP 128/74 | HR 66 | Resp 12 | Ht 63.0 in | Wt 165.0 lb

## 2015-10-28 DIAGNOSIS — Z86010 Personal history of colonic polyps: Secondary | ICD-10-CM

## 2015-10-28 MED ORDER — POLYETHYLENE GLYCOL 3350 17 GM/SCOOP PO POWD: 1.0000 | Freq: Once | ORAL | Status: DC

## 2015-10-28 NOTE — H&P (Signed)
HPI  Jason Malone is a 75 y.o. male. Who presents for a colonoscopy discussion. The last colonoscopy and endoscopy was completed on 11-02-05 . Denies any gastrointestinal issues. Bowels move regular and no bleeding noted.  History of gastritis, H pylori and colon polyps.  He is here today with his wife, Jason Malone.  HPI  Past Medical History   Diagnosis  Date   .  Full dentures      upper and lower   .  Wears glasses    .  Enlarged prostate    .  Hypomagnesemia    .  H/O hypokalemia    .  Diverticulitis      DIVERTICULOSIS   .  Hyperlipemia    .  Paroxysmal atrial flutter (HCC)      a. 03/2013->no recurrence; b. CHA2DS2VASc = 2-->not currently on anticoagulation; c. 05/2012 Echo: EF 55-60%, normal RV.   [name]  Allergic rhinitis    .  IFG (impaired fasting glucose)    .  Monocytosis    .  Benign prostatic hypertrophy with lower urinary tract symptoms (LUTS)    .  Chest pain      a. 05/2013 MV: EF 70%, no ischemia.   .  Lung cancer ([place])      a. carcinoid, left lung, Stage 1b (T2a, N0, cM0); b. 05/2013 s/p VATS & LULobectomy.   .  Essential hypertension      CONTROLLED ON MEDS   .  Hx of atrial fibrillation without current medication      CARDIOLOGIST-DR Pennie Rushing    Past Surgical History   Procedure  Laterality  Date   .  Multiple tooth extractions     .  Tonsillectomy     .  Colonoscopy w/ polypectomy       bleed after-had to go to surgery to stop bleeding via colonoscopy   .  Dupuytren contracture release   12/15/2011     Procedure: DUPUYTREN CONTRACTURE RELEASE; Surgeon: [name], MD; Location: [place]; Service: Orthopedics; Laterality: Left; Fasciotomy left ring finger dupuytrens   .  Bronchoscopy       04/06/2013   .  Vasectomy     .  Video bronchoscopy  N/A  06/04/2013     Procedure: VIDEO BRONCHOSCOPY; Surgeon: [name], MD; Location: [facility] OR; Service: Thoracic; Laterality: N/A;   .  Video assisted thoracoscopy (vats)/wedge resection  Left   06/04/2013     Procedure: VIDEO ASSISTED THORACOSCOPY (VATS)/WEDGE RESECTION; Surgeon: [name], MD; Location: [place]; Service: Thoracic; Laterality: Left;   .  Cardiovascular stress test   05/2013     a. No evidence of ischemia or infarct, EF 70%, no WMAs   .  Lung lobectomy   06/10/13     upper left lung   .  Cataract extraction w/phaco  Left  07/14/2015     Procedure: CATARACT EXTRACTION PHACO AND INTRAOCULAR LENS PLACEMENT (IOC); Surgeon: [name], MD; Location: [place]; Service: Ophthalmology; Laterality: Left;   .  Cataract extraction w/phaco  Right  10/01/2015     Procedure: CATARACT EXTRACTION PHACO AND INTRAOCULAR LENS PLACEMENT (IOC); Surgeon: [name], MD; Location: [place]; Service: Ophthalmology; Laterality: Right;   .  Upper gastrointestinal endoscopy   11-03-15     Dr [name]    Family History   Problem  Relation  Age of Onset   .  Stroke  Mother    .  Alzheimer's disease  Mother    .  Hypertension  Sister    .  Hyperlipidemia  Sister    .  Hypertension  Brother    .  Hyperlipidemia  Brother    .  Cancer  Sister      lung and colon   .  Diabetes  Brother     Social History  Social History   Substance Use Topics   .  Smoking status:  Former Smoker -- 1.00 packs/day for 40 years     Types:  Cigarettes     Quit date:  12/09/1998   .  Smokeless tobacco:  Never Used   .  Alcohol Use:  No    Allergies   Allergen  Reactions   .  Latex  Itching     Tape only- REACTED TO BANDAGE ON ABDOMEN   .  Tape  Itching     Surgical tapes    Current Outpatient Prescriptions   Medication  Sig  Dispense  Refill   .  amLODipine (NORVASC) 2.5 MG tablet  Take 1 tablet (2.5 mg total) by mouth daily. (Patient taking differently: Take 2.5 mg by mouth daily. AM)  90 tablet  3   .  b complex vitamins capsule  Take 1 capsule by mouth daily. AM     .  carvedilol (COREG) 3.125 MG tablet  TAKE ONE TABLET BY MOUTH TWICE DAILY (Patient taking  differently: TAKE ONE TABLET BY MOUTH TWICE DAILY/ AM AND BEDTIME)  180 tablet  3   .  Cinnamon 500 MG capsule  Take 500 mg by mouth 2 (two) times daily. AM AND BEDTIME     .  finasteride (PROSCAR) 5 MG tablet  TAKE ONE TABLET BY MOUTH ONCE DAILY  90 tablet  3   .  loratadine (CLARITIN) 10 MG tablet  Take 10 mg by mouth daily. AM     .  losartan (COZAAR) 100 MG tablet  Take 1 tablet (100 mg total) by mouth daily. (Patient taking differently: Take 100 mg by mouth daily. AM)  90 tablet  3   .  Multiple Vitamins-Minerals (MULTIVITAMIN WITH MINERALS) tablet  Take 1 tablet by mouth daily. AM     .  polycarbophil (FIBERCON) 625 MG tablet  Take 625 mg by mouth daily. AM     .  simvastatin (ZOCOR) 40 MG tablet  Take 1 tablet (40 mg total) by mouth at bedtime.  90 tablet  1   .  tamsulosin (FLOMAX) 0.4 MG CAPS capsule  Take 1 capsule (0.4 mg total) by mouth daily. (Patient taking differently: Take 0.4 mg by mouth daily. BEDTIME)  90 capsule  3   .  Turmeric 500 MG TABS  Take by mouth daily. AM     .  vitamin C (ASCORBIC ACID) 500 MG tablet  Take 500 mg by mouth daily. AM     .  polyethylene glycol powder (GLYCOLAX/MIRALAX) powder  Take 255 g by mouth once.  255 g  0    No current facility-administered medications for this visit.    Review of Systems  Review of Systems  Constitutional: Negative.  Respiratory: Negative.  Cardiovascular: Negative.  Gastrointestinal: Negative for abdominal pain, diarrhea, constipation and blood in stool.   Blood pressure 128/74, pulse 66, resp. rate 12, height '5\' 3"'$  (1.6 m), weight 165 lb (74.844 kg).  Physical Exam  Physical Exam  Constitutional: He is oriented to person, place, and time. He appears well-developed and well-nourished.  HENT:  Mouth/Throat: Oropharynx is clear and moist.  Eyes: Conjunctivae are normal. No scleral icterus.  Neck: Neck supple.  Cardiovascular: Normal rate, regular rhythm and normal heart sounds.  Pulmonary/Chest: Effort normal and  breath sounds normal.  Lymphadenopathy:  He has no cervical adenopathy.  Neurological: He is alert and oriented to person, place, and time.  Skin: Skin is warm and dry.  Psychiatric: His behavior is normal.   Data Reviewed  2007 colonoscopy showed diverticulosis in the sigmoid colon as well is a 6 mm sessile polyp. Pathology on the polyp showed it to be hyperplastic.  Upper endoscopy at the same time showed only some granular mucosa in the duodenal bulb. (2003 upper endoscopy showed chronic active gastritis with H. pylori identified.  01/01/2002 colonoscopy completed by Dr. Nicolasa Ducking showed a 15 mm sigmoid colon polyp. Menses pathology not available)  Assessment   Candidate for screening colonoscopy based on past polyp history.   Plan    Colonoscopy with possible biopsy/polypectomy prn: Information regarding the procedure, including its potential risks and complications (including but not limited to perforation of the bowel, which may require emergency surgery to repair, and bleeding) was verbally given to the patient. Educational information regarding lower intestinal endoscopy was given to the patient. Written instructions for how to complete the bowel prep using Miralax were provided. The importance of drinking ample fluids to avoid dehydration as a result of the prep emphasized.  The patient is scheduled for a Colonoscopy at [facility] on 11/19/15. They are aware to call the day before to get their arrival time. He will only take his blood pressure and heart medication the morning of at 6 am with a small sip of water. Miralax prescription has been sent into the patient's pharmacy. The patient is aware of date and instructions.  PCP: Arnetha Courser  This information has been scribed by Karie Fetch RN, BSN,BC.  [name]  [name]  10/28/2015, 2:34 PM

## 2015-10-28 NOTE — Patient Instructions (Addendum)
Colonoscopy A colonoscopy is an exam to look at the entire large intestine (colon). This exam can help find problems such as tumors, polyps, inflammation, and areas of bleeding. The exam takes about 1 hour.  LET [facility] CARE PROVIDER KNOW ABOUT:   Any allergies you have.  All medicines you are taking, including vitamins, herbs, eye drops, creams, and over-the-counter medicines.  Previous problems you or members of your family have had with the use of anesthetics.  Any blood disorders you have.  Previous surgeries you have had.  Medical conditions you have. RISKS AND COMPLICATIONS  Generally, this is a safe procedure. However, as with any procedure, complications can occur. Possible complications include:  Bleeding.  Tearing or rupture of the colon wall.  Reaction to medicines given during the exam.  Infection (rare). BEFORE THE PROCEDURE   Ask your health care provider about changing or stopping your regular medicines.  You may be prescribed an oral bowel prep. This involves drinking a large amount of medicated liquid, starting the day before your procedure. The liquid will cause you to have multiple loose stools until your stool is almost clear or light green. This cleans out your colon in preparation for the procedure.  Do not eat or drink anything else once you have started the bowel prep, unless your health care provider tells you it is safe to do so.  Arrange for someone to drive you home after the procedure. PROCEDURE   You will be given medicine to help you relax (sedative).  You will lie on your side with your knees bent.  A long, flexible tube with a light and camera on the end (colonoscope) will be inserted through the rectum and into the colon. The camera sends video back to a computer screen as it moves through the colon. The colonoscope also releases carbon dioxide gas to inflate the colon. This helps your health care provider see the area better.  During  the exam, your health care provider may take a small tissue sample (biopsy) to be examined under a microscope if any abnormalities are found.  The exam is finished when the entire colon has been viewed. AFTER THE PROCEDURE   Do not drive for 24 hours after the exam.  You may have a small amount of blood in your stool.  You may pass moderate amounts of gas and have mild abdominal cramping or bloating. This is caused by the gas used to inflate your colon during the exam.  Ask when your test results will be ready and how you will get your results. Make sure you get your test results.   This information is not intended to replace advice given to you by your health care provider. Make sure you discuss any questions you have with your health care provider.   Document Released: 04/23/2000 Document Revised: 02/14/2013 Document Reviewed: 01/01/2013 Elsevier Interactive Patient Education [employer].  The patient is scheduled for a Colonoscopy at [facility] on 11/19/15. They are aware to call the day before to get their arrival time. He will only take his blood pressure and heart medication the morning of at 6 am with a small sip of water. Miralax prescription has been sent into the patient's pharmacy. The patient is aware of date and instructions.

## 2015-10-28 NOTE — Progress Notes (Addendum)
Patient ID: Jason Malone, male   DOB: [birth year], 75 y.o.   MRN: [id number]  Chief Complaint  Patient presents with  . Colonoscopy    HPI Jason Malone is a 75 y.o. male.  Who presents for a colonoscopy discussion. The last colonoscopy and endoscopy was completed on 11-02-05  . Denies any gastrointestinal issues. Bowels move regular and no bleeding noted. History of gastritis, H pylori and colon polyps. He is here today with his wife, Kathy Wares.  HPI  Past Medical History  Diagnosis Date  . Full dentures     upper and lower  . Wears glasses   . Enlarged prostate   . Hypomagnesemia   . H/O hypokalemia   . Diverticulitis     DIVERTICULOSIS  . Hyperlipemia   . Paroxysmal atrial flutter (HCC)     a. 03/2013->no recurrence;  b. CHA2DS2VASc = 2-->not currently on anticoagulation;  c. 05/2012 Echo: EF 55-60%, normal RV.  [name] Allergic rhinitis   . IFG (impaired fasting glucose)   . Monocytosis   . Benign prostatic hypertrophy with lower urinary tract symptoms (LUTS)   . Chest pain     a. 05/2013 MV: EF 70%, no ischemia.  . Lung cancer ([place])     a. carcinoid, left lung, Stage 1b (T2a, N0, cM0);  b. 05/2013 s/p VATS & LULobectomy.  . Essential hypertension     CONTROLLED ON MEDS  . Hx of atrial fibrillation without current medication     CARDIOLOGIST-DR Pennie Rushing    Past Surgical History  Procedure Laterality Date  . Multiple tooth extractions    . Tonsillectomy    . Colonoscopy w/ polypectomy      bleed after-had to go to surgery to stop bleeding via colonoscopy  . Dupuytren contracture release  12/15/2011    Procedure: DUPUYTREN CONTRACTURE RELEASE;  Surgeon: [name], MD;  Location: [place];  Service: Orthopedics;  Laterality: Left;  Fasciotomy left ring finger dupuytrens  . Bronchoscopy      04/06/2013  . Vasectomy    . Video bronchoscopy N/A 06/04/2013    Procedure: VIDEO BRONCHOSCOPY;  Surgeon: [name], MD;  Location: [facility] OR;  Service:  Thoracic;  Laterality: N/A;  . Video assisted thoracoscopy (vats)/wedge resection Left 06/04/2013    Procedure: VIDEO ASSISTED THORACOSCOPY (VATS)/WEDGE RESECTION;  Surgeon: [name], MD;  Location: [place];  Service: Thoracic;  Laterality: Left;  . Cardiovascular stress test  05/2013    a. No evidence of ischemia or infarct, EF 70%, no WMAs  . Lung lobectomy  06/10/13    upper left lung  . Cataract extraction w/phaco Left 07/14/2015    Procedure: CATARACT EXTRACTION PHACO AND INTRAOCULAR LENS PLACEMENT (IOC);  Surgeon: [name], MD;  Location: [place];  Service: Ophthalmology;  Laterality: Left;  . Cataract extraction w/phaco Right 10/01/2015    Procedure: CATARACT EXTRACTION PHACO AND INTRAOCULAR LENS PLACEMENT (IOC);  Surgeon: [name], MD;  Location: [place];  Service: Ophthalmology;  Laterality: Right;  . Upper gastrointestinal endoscopy  11-03-15    Dr [name]    Family History  Problem Relation Age of Onset  . Stroke Mother   . Alzheimer's disease Mother   . Hypertension Sister   . Hyperlipidemia Sister   . Hypertension Brother   . Hyperlipidemia Brother   . Cancer Sister     lung and colon  . Diabetes Brother     Social History Social History  Substance Use  Topics  . Smoking status: Former Smoker -- 1.00 packs/day for 40 years    Types: Cigarettes    Quit date: 12/09/1998  . Smokeless tobacco: Never Used  . Alcohol Use: No    Allergies  Allergen Reactions  . Latex Itching    Tape only- REACTED TO BANDAGE ON ABDOMEN  . Tape Itching    Surgical tapes     Current Outpatient Prescriptions  Medication Sig Dispense Refill  . amLODipine (NORVASC) 2.5 MG tablet Take 1 tablet (2.5 mg total) by mouth daily. (Patient taking differently: Take 2.5 mg by mouth daily. AM) 90 tablet 3  . b complex vitamins capsule Take 1 capsule by mouth daily. AM    . carvedilol (COREG) 3.125 MG tablet TAKE ONE TABLET BY MOUTH TWICE DAILY  (Patient taking differently: TAKE ONE TABLET BY MOUTH TWICE DAILY/ AM AND BEDTIME) 180 tablet 3  . Cinnamon 500 MG capsule Take 500 mg by mouth 2 (two) times daily. AM AND BEDTIME    . finasteride (PROSCAR) 5 MG tablet TAKE ONE TABLET BY MOUTH ONCE DAILY 90 tablet 3  . loratadine (CLARITIN) 10 MG tablet Take 10 mg by mouth daily. AM    . losartan (COZAAR) 100 MG tablet Take 1 tablet (100 mg total) by mouth daily. (Patient taking differently: Take 100 mg by mouth daily. AM) 90 tablet 3  . Multiple Vitamins-Minerals (MULTIVITAMIN WITH MINERALS) tablet Take 1 tablet by mouth daily. AM    . polycarbophil (FIBERCON) 625 MG tablet Take 625 mg by mouth daily. AM    . simvastatin (ZOCOR) 40 MG tablet Take 1 tablet (40 mg total) by mouth at bedtime. 90 tablet 1  . tamsulosin (FLOMAX) 0.4 MG CAPS capsule Take 1 capsule (0.4 mg total) by mouth daily. (Patient taking differently: Take 0.4 mg by mouth daily. BEDTIME) 90 capsule 3  . Turmeric 500 MG TABS Take by mouth daily. AM    . vitamin C (ASCORBIC ACID) 500 MG tablet Take 500 mg by mouth daily. AM    . polyethylene glycol powder (GLYCOLAX/MIRALAX) powder Take 255 g by mouth once. 255 g 0   No current facility-administered medications for this visit.    Review of Systems Review of Systems  Constitutional: Negative.   Respiratory: Negative.   Cardiovascular: Negative.   Gastrointestinal: Negative for abdominal pain, diarrhea, constipation and blood in stool.    Blood pressure 128/74, pulse 66, resp. rate 12, height '5\' 3"'$  (1.6 m), weight 165 lb (74.844 kg).  Physical Exam Physical Exam  Constitutional: He is oriented to person, place, and time. He appears well-developed and well-nourished.  HENT:  Mouth/Throat: Oropharynx is clear and moist.  Eyes: Conjunctivae are normal. No scleral icterus.  Neck: Neck supple.  Cardiovascular: Normal rate, regular rhythm and normal heart sounds.   Pulmonary/Chest: Effort normal and breath sounds normal.   Lymphadenopathy:    He has no cervical adenopathy.  Neurological: He is alert and oriented to person, place, and time.  Skin: Skin is warm and dry.  Psychiatric: His behavior is normal.    Data Reviewed 2007 colonoscopy showed diverticulosis in the sigmoid colon as well is a 6 mm sessile polyp. Pathology on the polyp showed it to be hyperplastic.  Upper endoscopy at the same time showed only some granular mucosa in the duodenal bulb. (2003 upper endoscopy showed chronic active gastritis with H. pylori identified.  01/01/2002 colonoscopy completed by Dr. Nicolasa Ducking showed a 15 mm sigmoid colon polyp. Pathology showed a tubulovillous adenoma without  high-grade dysplasia or malignancy. Assessment    Candidate for screening colonoscopy based on past polyp history.    Plan         Colonoscopy with possible biopsy/polypectomy prn: Information regarding the procedure, including its potential risks and complications (including but not limited to perforation of the bowel, which may require emergency surgery to repair, and bleeding) was verbally given to the patient. Educational information regarding lower intestinal endoscopy was given to the patient. Written instructions for how to complete the bowel prep using Miralax were provided. The importance of drinking ample fluids to avoid dehydration as a result of the prep emphasized.   The patient is scheduled for a Colonoscopy at [facility] on 11/19/15. They are aware to call the day before to get their arrival time. He will only take his blood pressure and heart medication the morning of at 6 am with a small sip of water. Miralax prescription has been sent into the patient's pharmacy. The patient is aware of date and instructions.    PCP:  Arnetha Courser This information has been scribed by Karie Fetch RN, BSN,BC.  [name]  [name] 10/28/2015, 2:34 PM

## 2015-11-03 ENCOUNTER — Other Ambulatory Visit: Payer: Self-pay | Admitting: Family Medicine

## 2015-11-03 DIAGNOSIS — E785 Hyperlipidemia, unspecified: Secondary | ICD-10-CM

## 2015-11-03 DIAGNOSIS — Z5181 Encounter for therapeutic drug level monitoring: Secondary | ICD-10-CM

## 2015-11-03 HISTORY — PX: UPPER GASTROINTESTINAL ENDOSCOPY: SHX188

## 2015-11-03 MED ORDER — ATORVASTATIN CALCIUM 40 MG PO TABS: 40.0000 mg | ORAL_TABLET | Freq: Every day | ORAL | Status: DC

## 2015-11-03 NOTE — Telephone Encounter (Signed)
April 2017 labs reviewed; he's on amlodipine I'd like to switch him from simvastatin to atorvastatin Risk of side effects higher when simvastatin and amlodipine are given together, so let's just change cholesterol medicine to reduce risk of problems in the future Recheck lipids and sgpt in 6 weeks after the change

## 2015-11-03 NOTE — Telephone Encounter (Signed)
I talked with patient

## 2015-11-03 NOTE — Assessment & Plan Note (Signed)
Check lipids in 6 weeks after switching from simvastatin to atorvastatin

## 2015-11-03 NOTE — Assessment & Plan Note (Signed)
Check sgpt 6 weeks after switching statins

## 2015-11-04 DIAGNOSIS — Z961 Presence of intraocular lens: Secondary | ICD-10-CM | POA: Diagnosis not present

## 2015-11-13 ENCOUNTER — Encounter: Payer: Self-pay | Admitting: *Deleted

## 2015-11-17 ENCOUNTER — Ambulatory Visit
Admission: RE | Admit: 2015-11-17 | Discharge: 2015-11-17 | Disposition: A | Discharge status: Home / Self Care | Payer: Medicare Other | Source: Ambulatory Visit | Attending: Ophthalmology | Admitting: Ophthalmology

## 2015-11-17 ENCOUNTER — Ambulatory Visit: Payer: Medicare Other | Admitting: Anesthesiology

## 2015-11-17 ENCOUNTER — Encounter
Admission: RE | Disposition: A | Discharge status: Home / Self Care | Payer: Self-pay | Source: Ambulatory Visit | Attending: Ophthalmology

## 2015-11-17 DIAGNOSIS — Z85118 Personal history of other malignant neoplasm of bronchus and lung: Secondary | ICD-10-CM | POA: Diagnosis not present

## 2015-11-17 DIAGNOSIS — Y838 Other surgical procedures as the cause of abnormal reaction of the patient, or of later complication, without mention of misadventure at the time of the procedure: Secondary | ICD-10-CM | POA: Insufficient documentation

## 2015-11-17 DIAGNOSIS — I10 Essential (primary) hypertension: Secondary | ICD-10-CM | POA: Insufficient documentation

## 2015-11-17 DIAGNOSIS — Z87891 Personal history of nicotine dependence: Secondary | ICD-10-CM | POA: Diagnosis not present

## 2015-11-17 DIAGNOSIS — H59021 Cataract (lens) fragments in eye following cataract surgery, right eye: Secondary | ICD-10-CM | POA: Diagnosis not present

## 2015-11-17 DIAGNOSIS — Z9842 Cataract extraction status, left eye: Secondary | ICD-10-CM | POA: Diagnosis not present

## 2015-11-17 DIAGNOSIS — Z79899 Other long term (current) drug therapy: Secondary | ICD-10-CM | POA: Insufficient documentation

## 2015-11-17 DIAGNOSIS — I4891 Unspecified atrial fibrillation: Secondary | ICD-10-CM | POA: Insufficient documentation

## 2015-11-17 DIAGNOSIS — Z9889 Other specified postprocedural states: Secondary | ICD-10-CM | POA: Insufficient documentation

## 2015-11-17 DIAGNOSIS — Z91040 Latex allergy status: Secondary | ICD-10-CM | POA: Insufficient documentation

## 2015-11-17 DIAGNOSIS — E7800 Pure hypercholesterolemia, unspecified: Secondary | ICD-10-CM | POA: Insufficient documentation

## 2015-11-17 HISTORY — PX: REMOVAL RETAINED LENS: SHX6464

## 2015-11-17 SURGERY — REMOVAL, RETAINED LENS MATTER
Anesthesia: Monitor Anesthesia Care | Site: Eye | Laterality: Right | Wound class: Clean

## 2015-11-17 MED ORDER — LACTATED RINGERS IV SOLN: INTRAVENOUS | Status: DC

## 2015-11-17 MED ORDER — TIMOLOL MALEATE 0.5 % OP SOLN
OPHTHALMIC | Status: DC | PRN
  Administered 2015-11-17: 1 [drp] via OPHTHALMIC

## 2015-11-17 MED ORDER — CEFUROXIME OPHTHALMIC INJECTION 1 MG/0.1 ML
INJECTION | OPHTHALMIC | Status: DC | PRN
  Administered 2015-11-17: 0.1 mL via INTRACAMERAL

## 2015-11-17 MED ORDER — ONDANSETRON HCL 4 MG/2ML IJ SOLN: 4.0000 mg | Freq: Once | INTRAMUSCULAR | Status: DC | PRN

## 2015-11-17 MED ORDER — POVIDONE-IODINE 5 % OP SOLN
1.0000 | OPHTHALMIC | Status: DC | PRN
Start: 2015-11-17 — End: 2015-11-17
  Administered 2015-11-17: 1 via OPHTHALMIC

## 2015-11-17 MED ORDER — FENTANYL CITRATE (PF) 100 MCG/2ML IJ SOLN: 25.0000 ug | INTRAMUSCULAR | Status: DC | PRN

## 2015-11-17 MED ORDER — FENTANYL CITRATE (PF) 100 MCG/2ML IJ SOLN
INTRAMUSCULAR | Status: DC | PRN
  Administered 2015-11-17: 50 ug via INTRAVENOUS

## 2015-11-17 MED ORDER — MIDAZOLAM HCL 2 MG/2ML IJ SOLN
INTRAMUSCULAR | Status: DC | PRN
  Administered 2015-11-17: 2 mg via INTRAVENOUS

## 2015-11-17 MED ORDER — NA HYALUR & NA CHOND-NA HYALUR 0.4-0.35 ML IO KIT
PACK | INTRAOCULAR | Status: DC | PRN
  Administered 2015-11-17: 1 mL via INTRAOCULAR

## 2015-11-17 MED ORDER — ACETAMINOPHEN 325 MG PO TABS: 325.0000 mg | ORAL_TABLET | ORAL | Status: DC | PRN

## 2015-11-17 MED ORDER — BRIMONIDINE TARTRATE 0.2 % OP SOLN
OPHTHALMIC | Status: DC | PRN
  Administered 2015-11-17: 1 [drp] via OPHTHALMIC

## 2015-11-17 MED ORDER — OXYCODONE HCL 5 MG PO TABS: 5.0000 mg | ORAL_TABLET | Freq: Once | ORAL | Status: DC | PRN

## 2015-11-17 MED ORDER — OXYCODONE HCL 5 MG/5ML PO SOLN: 5.0000 mg | Freq: Once | ORAL | Status: DC | PRN

## 2015-11-17 MED ORDER — ARMC OPHTHALMIC DILATING GEL
1.0000 "application " | Freq: Once | OPHTHALMIC | Status: AC
  Administered 2015-11-17 (×2): 1 via OPHTHALMIC

## 2015-11-17 MED ORDER — EPINEPHRINE HCL 1 MG/ML IJ SOLN
INTRAOCULAR | Status: DC | PRN
  Administered 2015-11-17: 29 mL via OPHTHALMIC

## 2015-11-17 MED ORDER — ACETAMINOPHEN 160 MG/5ML PO SOLN: 325.0000 mg | ORAL | Status: DC | PRN

## 2015-11-17 MED ORDER — TETRACAINE HCL 0.5 % OP SOLN
1.0000 [drp] | Freq: Once | OPHTHALMIC | Status: AC
  Administered 2015-11-17: 1 [drp] via OPHTHALMIC

## 2015-11-17 SURGICAL SUPPLY — 24 items
CANNULA ANT/CHMB 27GA (MISCELLANEOUS) ×3 IMPLANT
CARTRIDGE ABBOTT (MISCELLANEOUS) IMPLANT
GLOVE SURG LX 7.5 STRW (GLOVE) ×2
GLOVE SURG LX STRL 7.5 STRW (GLOVE) ×1 IMPLANT
GLOVE SURG TRIUMPH 8.0 PF LTX (GLOVE) ×3 IMPLANT
GOWN STRL REUS W/ TWL LRG LVL3 (GOWN DISPOSABLE) ×2 IMPLANT
GOWN STRL REUS W/TWL LRG LVL3 (GOWN DISPOSABLE) ×4
MARKER SKIN DUAL TIP RULER LAB (MISCELLANEOUS) ×3 IMPLANT
NDL RETROBULBAR .5 NSTRL (NEEDLE) IMPLANT
NEEDLE FILTER BLUNT 18X 1/2SAF (NEEDLE) ×2
NEEDLE FILTER BLUNT 18X1 1/2 (NEEDLE) ×1 IMPLANT
PACK CATARACT BRASINGTON (MISCELLANEOUS) ×3 IMPLANT
PACK EYE AFTER SURG (MISCELLANEOUS) ×3 IMPLANT
PACK OPTHALMIC (MISCELLANEOUS) ×3 IMPLANT
RING MALYGIN 7.0 (MISCELLANEOUS) IMPLANT
SUT ETHILON 10-0 CS-B-6CS-B-6 (SUTURE)
SUT VICRYL  9 0 (SUTURE)
SUT VICRYL 9 0 (SUTURE) IMPLANT
SUTURE EHLN 10-0 CS-B-6CS-B-6 (SUTURE) IMPLANT
SYR 3ML LL SCALE MARK (SYRINGE) ×3 IMPLANT
SYR 5ML LL (SYRINGE) ×3 IMPLANT
SYR TB 1ML LUER SLIP (SYRINGE) ×3 IMPLANT
WATER STERILE IRR 250ML POUR (IV SOLUTION) ×3 IMPLANT
WIPE NON LINTING 3.25X3.25 (MISCELLANEOUS) ×3 IMPLANT

## 2015-11-17 NOTE — Op Note (Signed)
OPERATIVE NOTE  ELAINE MIDDLETON [id number] 11/17/2015   PREOPERATIVE DIAGNOSIS:  H59.021 FRAGMENTS IN EYE AFTER CAT SURG    POSTOPERATIVE DIAGNOSIS: fragments in right eye retained post cataract surgery          PROCEDURE:  Removal of lens fragment from anterior chamber right eye LENS:  * No implants in log *      SURGEON:  Wyonia Hough, MD   ANESTHESIA:  Topical with tetracaine drops and 2% Xylocaine jelly.   COMPLICATIONS:  None.   DESCRIPTION OF PROCEDURE:  The patient was identified in the holding room and transported to the operating room and placed in the supine position under the operating microscope. Theright eye was identified as the operative eye and it was prepped and draped in the usual sterile ophthalmic fashion.   A 1 millimeter clear-corneal paracentesis was made at the 12:00 position.  The anterior chamber was filled with Viscoat viscoelastic.  A 2.4 millimeter keratome was used to make a near-clear corneal incision at the 9:00 position. Irrigation and aspiration were used to remove a 1.5 mm piece of lens nucleus which had been left in the anterior chamber.  The piece was underneath the iris with the patient supine at the time of this surgery, whereas it had been present in the inferior anterior chamber beginning about 3 to 4 weeks postoperatively from the cataract surgery.  After removal of this piece, the remainder of the viscoat was aspirated. Wounds were hydrated with balanced salt solution.  The anterior chamber was inflated to a physiologic pressure with balanced salt solution.  No wound leaks were noted.Cefuroxime 0.1 ml of a '10mg'$ /ml solution was injected into the anterior chamber for a dose of 1 mg of intracameral antibiotic at the completion of the case. Timolol and Brimonidine drops were applied to the eye.  The patient was taken to the recovery room in stable condition without complications of anesthesia or surgery.  Shanece Cochrane 11/17/2015, 7:39  AM

## 2015-11-17 NOTE — Anesthesia Procedure Notes (Signed)
Procedure Name: MAC Performed by: Monic Engelmann Pre-anesthesia Checklist: Patient identified, Emergency Drugs available, Suction available, Timeout performed and Patient being monitored Patient Re-evaluated:Patient Re-evaluated prior to inductionOxygen Delivery Method: Nasal cannula Placement Confirmation: positive ETCO2     

## 2015-11-17 NOTE — Discharge Instructions (Signed)

## 2015-11-17 NOTE — H&P (Signed)
  The History and Physical notes are on paper, have been signed, and are to be scanned. The patient remains stable and unchanged from the H&P.   Previous H&P reviewed, patient examined, and there are no changes.  Jason Malone 11/17/2015 7:21 AM

## 2015-11-17 NOTE — Anesthesia Preprocedure Evaluation (Signed)
Anesthesia Evaluation    Airway Mallampati: II  TM Distance: >3 FB Neck ROM: Full    Dental no notable dental hx. (+) Edentulous Upper, Edentulous Lower   Pulmonary former smoker,  S/p lung ressection for cancer   Pulmonary exam normal breath sounds clear to auscultation       Cardiovascular hypertension, Normal cardiovascular exam+ dysrhythmias  Rhythm:Regular Rate:Normal  afib   Neuro/Psych    GI/Hepatic Neg liver ROS, diverticulitis   Endo/Other  negative endocrine ROS  Renal/GU negative Renal ROS     Musculoskeletal negative musculoskeletal ROS (+)   Abdominal   Peds  Hematology negative hematology ROS (+)   Anesthesia Other Findings   Reproductive/Obstetrics negative OB ROS                             Anesthesia Physical  Anesthesia Plan  ASA: III  Anesthesia Plan: MAC   Post-op Pain Management:    Induction: Intravenous  Airway Management Planned:   Additional Equipment:   Intra-op Plan:   Post-operative Plan: Extubation in OR  Informed Consent: I have reviewed the patients History and Physical, chart, labs and discussed the procedure including the risks, benefits and alternatives for the proposed anesthesia with the patient or authorized representative who has indicated his/her understanding and acceptance.   Dental advisory given  Plan Discussed with: CRNA  Anesthesia Plan Comments:         Anesthesia Quick Evaluation

## 2015-11-17 NOTE — Transfer of Care (Signed)
Immediate Anesthesia Transfer of Care Note  Patient: Jason Malone  Procedure(s) Performed: Procedure(s): REMOVAL RETAINED LENS FROAGMENTS RIGHT EYE (Right)  Patient Location: PACU  Anesthesia Type: MAC  Level of Consciousness: awake, alert  and patient cooperative  Airway and Oxygen Therapy: Patient Spontanous Breathing and Patient connected to supplemental oxygen  Post-op Assessment: Post-op Vital signs reviewed, Patient's Cardiovascular Status Stable, Respiratory Function Stable, Patent Airway and No signs of Nausea or vomiting  Post-op Vital Signs: Reviewed and stable  Complications: No apparent anesthesia complications

## 2015-11-17 NOTE — Anesthesia Postprocedure Evaluation (Signed)
Anesthesia Post Note  Patient: Jason Malone  Procedure(s) Performed: Procedure(s) (LRB): REMOVAL RETAINED LENS FROAGMENTS RIGHT EYE (Right)  Patient location during evaluation: PACU Anesthesia Type: MAC Level of consciousness: awake and alert Pain management: pain level controlled Vital Signs Assessment: post-procedure vital signs reviewed and stable Respiratory status: spontaneous breathing, nonlabored ventilation, respiratory function stable and patient connected to nasal cannula oxygen Cardiovascular status: stable and blood pressure returned to baseline Anesthetic complications: no    Amaryllis Dyke

## 2015-11-18 ENCOUNTER — Encounter: Payer: Self-pay | Admitting: Ophthalmology

## 2015-11-19 ENCOUNTER — Ambulatory Visit: Payer: Medicare Other | Admitting: Anesthesiology

## 2015-11-19 ENCOUNTER — Encounter: Payer: Self-pay | Admitting: *Deleted

## 2015-11-19 ENCOUNTER — Encounter
Admission: RE | Disposition: A | Discharge status: Home / Self Care | Payer: Self-pay | Source: Ambulatory Visit | Attending: General Surgery

## 2015-11-19 ENCOUNTER — Ambulatory Visit
Admission: RE | Admit: 2015-11-19 | Discharge: 2015-11-19 | Disposition: A | Discharge status: Home / Self Care | Payer: Medicare Other | Source: Ambulatory Visit | Attending: General Surgery | Admitting: General Surgery

## 2015-11-19 DIAGNOSIS — D125 Benign neoplasm of sigmoid colon: Secondary | ICD-10-CM | POA: Insufficient documentation

## 2015-11-19 DIAGNOSIS — Z85118 Personal history of other malignant neoplasm of bronchus and lung: Secondary | ICD-10-CM | POA: Diagnosis not present

## 2015-11-19 DIAGNOSIS — K648 Other hemorrhoids: Secondary | ICD-10-CM | POA: Insufficient documentation

## 2015-11-19 DIAGNOSIS — Z9889 Other specified postprocedural states: Secondary | ICD-10-CM | POA: Diagnosis not present

## 2015-11-19 DIAGNOSIS — Z1211 Encounter for screening for malignant neoplasm of colon: Secondary | ICD-10-CM | POA: Diagnosis not present

## 2015-11-19 DIAGNOSIS — N400 Enlarged prostate without lower urinary tract symptoms: Secondary | ICD-10-CM | POA: Diagnosis not present

## 2015-11-19 DIAGNOSIS — I10 Essential (primary) hypertension: Secondary | ICD-10-CM | POA: Diagnosis not present

## 2015-11-19 DIAGNOSIS — Z86010 Personal history of colonic polyps: Secondary | ICD-10-CM | POA: Insufficient documentation

## 2015-11-19 DIAGNOSIS — Z9841 Cataract extraction status, right eye: Secondary | ICD-10-CM | POA: Insufficient documentation

## 2015-11-19 DIAGNOSIS — J309 Allergic rhinitis, unspecified: Secondary | ICD-10-CM | POA: Insufficient documentation

## 2015-11-19 DIAGNOSIS — K635 Polyp of colon: Secondary | ICD-10-CM | POA: Diagnosis not present

## 2015-11-19 DIAGNOSIS — K649 Unspecified hemorrhoids: Secondary | ICD-10-CM | POA: Diagnosis not present

## 2015-11-19 DIAGNOSIS — Z9842 Cataract extraction status, left eye: Secondary | ICD-10-CM | POA: Insufficient documentation

## 2015-11-19 DIAGNOSIS — K5730 Diverticulosis of large intestine without perforation or abscess without bleeding: Secondary | ICD-10-CM | POA: Diagnosis not present

## 2015-11-19 DIAGNOSIS — K5790 Diverticulosis of intestine, part unspecified, without perforation or abscess without bleeding: Secondary | ICD-10-CM | POA: Diagnosis not present

## 2015-11-19 DIAGNOSIS — Z9852 Vasectomy status: Secondary | ICD-10-CM | POA: Diagnosis not present

## 2015-11-19 DIAGNOSIS — Z8679 Personal history of other diseases of the circulatory system: Secondary | ICD-10-CM | POA: Diagnosis not present

## 2015-11-19 DIAGNOSIS — Z961 Presence of intraocular lens: Secondary | ICD-10-CM | POA: Insufficient documentation

## 2015-11-19 DIAGNOSIS — K644 Residual hemorrhoidal skin tags: Secondary | ICD-10-CM | POA: Diagnosis not present

## 2015-11-19 DIAGNOSIS — E785 Hyperlipidemia, unspecified: Secondary | ICD-10-CM | POA: Diagnosis not present

## 2015-11-19 DIAGNOSIS — Z87891 Personal history of nicotine dependence: Secondary | ICD-10-CM | POA: Insufficient documentation

## 2015-11-19 HISTORY — PX: COLONOSCOPY WITH PROPOFOL: SHX5780

## 2015-11-19 SURGERY — COLONOSCOPY WITH PROPOFOL
Anesthesia: General

## 2015-11-19 MED ORDER — SODIUM CHLORIDE 0.9 % IV SOLN
INTRAVENOUS | Status: DC
  Administered 2015-11-19: 1000 mL via INTRAVENOUS

## 2015-11-19 MED ORDER — PROPOFOL 10 MG/ML IV BOLUS
INTRAVENOUS | Status: DC | PRN
  Administered 2015-11-19: 30 mg via INTRAVENOUS

## 2015-11-19 MED ORDER — PROPOFOL 500 MG/50ML IV EMUL
INTRAVENOUS | Status: DC | PRN
  Administered 2015-11-19: 120 ug/kg/min via INTRAVENOUS

## 2015-11-19 MED ORDER — LIDOCAINE HCL (CARDIAC) 20 MG/ML IV SOLN
INTRAVENOUS | Status: DC | PRN
  Administered 2015-11-19: 60 mg via INTRAVENOUS

## 2015-11-19 NOTE — Op Note (Signed)
[facility] Gastroenterology Patient Name: Jason Malone Procedure Date: 11/19/2015 11:12 AM MRN: [id number] Account #: [id number] Date of Birth: [birth year] Admit Type: Outpatient Age: 75 Room: [facility] ENDO ROOM 1 Gender: Male Note Status: Finalized Procedure:            Colonoscopy Indications:          High risk colon cancer surveillance: Personal history                        of colonic polyps Providers:            [name], MD Referring MD:         Arnetha Courser (Referring MD) Medicines:            Monitored Anesthesia Care Complications:        No immediate complications. Procedure:            Pre-Anesthesia Assessment:                       - Prior to the procedure, a History and Physical was                        performed, and patient medications, allergies and                        sensitivities were reviewed. The patient's tolerance of                        previous anesthesia was reviewed.                       - The risks and benefits of the procedure and the                        sedation options and risks were discussed with the                        patient. All questions were answered and informed                        consent was obtained.                       After obtaining informed consent, the colonoscope was                        passed under direct vision. Throughout the procedure,                        the patient's blood pressure, pulse, and oxygen                        saturations were monitored continuously. The                        Colonoscope was introduced through the anus and                        advanced to the the cecum, identified by appendiceal  orifice and ileocecal valve. The colonoscopy was                        performed with difficulty due to multiple diverticula                        in the colon. The patient tolerated the procedure well.                        The quality of  the bowel preparation was excellent. Findings:      Multiple medium-mouthed diverticula were found in the sigmoid colon and       descending colon.      A 5 mm polyp was found in the sigmoid colon. The polyp was sessile. This       was biopsied with a cold forceps for histology.      External and internal hemorrhoids were found during retroflexion. The       hemorrhoids were moderate. Impression:           - Diverticulosis in the sigmoid colon and in the                        descending colon.                       - One 5 mm polyp in the sigmoid colon. Biopsied.                       - External and internal hemorrhoids. Recommendation:       - Telephone endoscopist for pathology results in 1 week. Procedure Code(s):    --- Professional ---                       [phone], Colonoscopy, flexible; with biopsy, single or                        multiple Diagnosis Code(s):    --- Professional ---                       Z86.010, Personal history of colonic polyps                       K64.8, Other hemorrhoids                       D12.5, Benign neoplasm of sigmoid colon                       K57.30, Diverticulosis of large intestine without                        perforation or abscess without bleeding CPT copyright 2016 American Medical Association. All rights reserved. The codes documented in this report are preliminary and upon coder review may  be revised to meet current compliance requirements. [name], MD 11/19/2015 11:37:12 AM This report has been signed electronically. Number of Addenda: 0 Note Initiated On: 11/19/2015 11:12 AM Scope Withdrawal Time: 0 hours 8 minutes 39 seconds  Total Procedure Duration: 0 hours 16 minutes 52 seconds       [facility]

## 2015-11-19 NOTE — Transfer of Care (Signed)
Immediate Anesthesia Transfer of Care Note  Patient: Jason Malone  Procedure(s) Performed: Procedure(s): COLONOSCOPY WITH PROPOFOL (N/A)  Patient Location: Endoscopy Unit  Anesthesia Type:General  Level of Consciousness: awake, alert , oriented and patient cooperative  Airway & Oxygen Therapy: Patient Spontanous Breathing and Patient connected to nasal cannula oxygen  Post-op Assessment: Report given to RN, Post -op Vital signs reviewed and stable and Patient moving all extremities X 4  Post vital signs: Reviewed and stable  Last Vitals:  Filed Vitals:   11/19/15 1019  BP: 115/74  Pulse: 64  Temp: 36.4 C  Resp: 18    Last Pain: There were no vitals filed for this visit.       Complications: No apparent anesthesia complications

## 2015-11-19 NOTE — H&P (Signed)
Jason Malone [id number] 12/09/1940     HPI:  75 y/o male for screening colonoscopy.  Had a 15 mm polyp in the sigmoid in 2003.   Tolerated prep well.   Prescriptions prior to admission  Medication Sig Dispense Refill Last Dose  . amLODipine (NORVASC) 2.5 MG tablet Take 1 tablet (2.5 mg total) by mouth daily. (Patient taking differently: Take 2.5 mg by mouth daily. AM) 90 tablet 3 11/19/2015 at 0630  . carvedilol (COREG) 3.125 MG tablet TAKE ONE TABLET BY MOUTH TWICE DAILY (Patient taking differently: TAKE ONE TABLET BY MOUTH TWICE DAILY/ AM AND BEDTIME) 180 tablet 3 11/19/2015 at 0630  . losartan (COZAAR) 100 MG tablet Take 1 tablet (100 mg total) by mouth daily. (Patient taking differently: Take 100 mg by mouth daily. AM) 90 tablet 3 11/19/2015 at 0630  . atorvastatin (LIPITOR) 40 MG tablet Take 1 tablet (40 mg total) by mouth at bedtime. (this replaces simvastatin); for cholesterol 90 tablet 0 11/16/2015 at Unknown time  . b complex vitamins capsule Take 1 capsule by mouth daily. AM   Past Week at Unknown time  . Cinnamon 500 MG capsule Take 500 mg by mouth 2 (two) times daily. AM AND BEDTIME   Past Week at Unknown time  . finasteride (PROSCAR) 5 MG tablet TAKE ONE TABLET BY MOUTH ONCE DAILY 90 tablet 3 11/16/2015 at Unknown time  . loratadine (CLARITIN) 10 MG tablet Take 10 mg by mouth daily. AM   11/17/2015 at 0530  . Multiple Vitamins-Minerals (MULTIVITAMIN WITH MINERALS) tablet Take 1 tablet by mouth daily. AM   Past Week at Unknown time  . polycarbophil (FIBERCON) 625 MG tablet Take 625 mg by mouth daily. AM   11/16/2015 at Unknown time  . polyethylene glycol powder (GLYCOLAX/MIRALAX) powder Take 255 g by mouth once. (Patient not taking: Reported on 11/17/2015) 255 g 0 Not Taking  . tamsulosin (FLOMAX) 0.4 MG CAPS capsule Take 1 capsule (0.4 mg total) by mouth daily. (Patient taking differently: Take 0.4 mg by mouth daily. BEDTIME) 90 capsule 3 11/16/2015 at Unknown time  . Turmeric 500 MG TABS Take  by mouth daily. AM   Past Week at Unknown time  . vitamin C (ASCORBIC ACID) 500 MG tablet Take 500 mg by mouth daily. AM   Past Week at Unknown time   Allergies  Allergen Reactions  . Latex Itching    Tape only- REACTED TO BANDAGE ON ABDOMEN  . Tape Itching    Surgical tapes    Past Medical History  Diagnosis Date  . Full dentures     upper and lower  . Wears glasses   . Enlarged prostate   . Hypomagnesemia   . H/O hypokalemia   . Diverticulitis     DIVERTICULOSIS  . Hyperlipemia   . Paroxysmal atrial flutter (HCC)     a. 03/2013->no recurrence;  b. CHA2DS2VASc = 2-->not currently on anticoagulation;  c. 05/2012 Echo: EF 55-60%, normal RV.  [name] Allergic rhinitis   . IFG (impaired fasting glucose)   . Monocytosis   . Benign prostatic hypertrophy with lower urinary tract symptoms (LUTS)   . Chest pain     a. 05/2013 MV: EF 70%, no ischemia.  . Lung cancer ([place])     a. carcinoid, left lung, Stage 1b (T2a, N0, cM0);  b. 05/2013 s/p VATS & LULobectomy.  . Essential hypertension     CONTROLLED ON MEDS  . Hx of atrial fibrillation without current medication  CARDIOLOGIST-DR Pennie Rushing   Past Surgical History  Procedure Laterality Date  . Multiple tooth extractions    . Tonsillectomy    . Colonoscopy w/ polypectomy      bleed after-had to go to surgery to stop bleeding via colonoscopy  . Dupuytren contracture release  12/15/2011    Procedure: DUPUYTREN CONTRACTURE RELEASE;  Surgeon: [name], MD;  Location: [place];  Service: Orthopedics;  Laterality: Left;  Fasciotomy left ring finger dupuytrens  . Bronchoscopy      04/06/2013  . Vasectomy    . Video bronchoscopy N/A 06/04/2013    Procedure: VIDEO BRONCHOSCOPY;  Surgeon: [name], MD;  Location: [facility] OR;  Service: Thoracic;  Laterality: N/A;  . Video assisted thoracoscopy (vats)/wedge resection Left 06/04/2013    Procedure: VIDEO ASSISTED THORACOSCOPY (VATS)/WEDGE RESECTION;  Surgeon: [name], MD;  Location: [place];  Service: Thoracic;  Laterality: Left;  . Cardiovascular stress test  05/2013    a. No evidence of ischemia or infarct, EF 70%, no WMAs  . Lung lobectomy  06/10/13    upper left lung  . Cataract extraction w/phaco Left 07/14/2015    Procedure: CATARACT EXTRACTION PHACO AND INTRAOCULAR LENS PLACEMENT (IOC);  Surgeon: [name], MD;  Location: [place];  Service: Ophthalmology;  Laterality: Left;  . Cataract extraction w/phaco Right 10/01/2015    Procedure: CATARACT EXTRACTION PHACO AND INTRAOCULAR LENS PLACEMENT (IOC);  Surgeon: [name], MD;  Location: [place];  Service: Ophthalmology;  Laterality: Right;  . Upper gastrointestinal endoscopy  11-03-15    Dr [name]  . Removal retained lens Right 11/17/2015    Procedure: REMOVAL RETAINED LENS FROAGMENTS RIGHT EYE;  Surgeon: [name], MD;  Location: [place];  Service: Ophthalmology;  Laterality: Right;   Social History   Social History  . Marital Status: Married    Spouse Name: N/A  . Number of Children: N/A  . Years of Education: N/A   Occupational History  . Not on file.   Social History Main Topics  . Smoking status: Former Smoker -- 1.00 packs/day for 40 years    Types: Cigarettes    Quit date: 12/09/1998  . Smokeless tobacco: Never Used  . Alcohol Use: No  . Drug Use: No  . Sexual Activity: Not on file   Other Topics Concern  . Not on file   Social History Narrative   Social History   Social History Narrative     ROS: Negative.     PE: HEENT: Negative. Lungs: Clear. Cardio: RR.  Assessment/Plan:  Proceed with planned endoscopy.     Jason Malone 11/19/2015

## 2015-11-19 NOTE — Anesthesia Preprocedure Evaluation (Signed)
Anesthesia Evaluation  Patient identified by MRN, date of birth, ID band Patient awake    Reviewed: Allergy & Precautions, H&P , NPO status , Patient's Chart, lab work & pertinent test results, reviewed documented beta blocker date and time   Airway Mallampati: II  TM Distance: >3 FB Neck ROM: full    Dental no notable dental hx. (+) Edentulous Upper, Edentulous Lower   Pulmonary neg shortness of breath, neg sleep apnea, neg COPD, neg recent URI, former smoker,  S/p LULobectomy   Pulmonary exam normal breath sounds clear to auscultation       Cardiovascular Exercise Tolerance: Good hypertension, On Home Beta Blockers and On Medications (-) angina(-) CAD, (-) Past MI, (-) Cardiac Stents and (-) CABG Normal cardiovascular exam+ dysrhythmias Atrial Fibrillation (-) Valvular Problems/Murmurs Rhythm:regular Rate:Normal     Neuro/Psych negative neurological ROS  negative psych ROS   GI/Hepatic negative GI ROS, Neg liver ROS,   Endo/Other  negative endocrine ROS  Renal/GU negative Renal ROS  negative genitourinary   Musculoskeletal   Abdominal   Peds  Hematology negative hematology ROS (+)   Anesthesia Other Findings Past Medical History:   Full dentures                                                  Comment:upper and lower   Wears glasses                                                Enlarged prostate                                            Hypomagnesemia                                               H/O hypokalemia                                              Diverticulitis                                                 Comment:DIVERTICULOSIS   Hyperlipemia                                                 Paroxysmal atrial flutter (HCC)                                Comment:a. 03/2013->no recurrence;  b. CHA2DS2VASc =  2-->not currently on anticoagulation;  c.               05/2012 Echo: EF 55-60%,  normal RV.   Allergic rhinitis                                            IFG (impaired fasting glucose)                               Monocytosis                                                  Benign prostatic hypertrophy with lower urinar*              Chest pain                                                     Comment:a. 05/2013 MV: EF 70%, no ischemia.   Lung cancer ([place])                                              Comment:a. carcinoid, left lung, Stage 1b (T2a, N0,               cM0);  b. 05/2013 s/p VATS & LULobectomy.   Essential hypertension                                         Comment:CONTROLLED ON MEDS   Hx of atrial fibrillation without current medi*                Comment:CARDIOLOGIST-DR GOLLAN/ [facility]   Reproductive/Obstetrics negative OB ROS                             Anesthesia Physical Anesthesia Plan  ASA: III  Anesthesia Plan: General   Post-op Pain Management:    Induction:   Airway Management Planned:   Additional Equipment:   Intra-op Plan:   Post-operative Plan:   Informed Consent: I have reviewed the patients History and Physical, chart, labs and discussed the procedure including the risks, benefits and alternatives for the proposed anesthesia with the patient or authorized representative who has indicated his/her understanding and acceptance.   Dental Advisory Given  Plan Discussed with: Anesthesiologist, CRNA and Surgeon  Anesthesia Plan Comments:         Anesthesia Quick Evaluation

## 2015-11-19 NOTE — Anesthesia Postprocedure Evaluation (Signed)
Anesthesia Post Note  Patient: Jason Malone  Procedure(s) Performed: Procedure(s) (LRB): COLONOSCOPY WITH PROPOFOL (N/A)  Patient location during evaluation: Endoscopy Anesthesia Type: General Level of consciousness: awake and alert Pain management: pain level controlled Vital Signs Assessment: post-procedure vital signs reviewed and stable Respiratory status: spontaneous breathing, nonlabored ventilation, respiratory function stable and patient connected to nasal cannula oxygen Cardiovascular status: blood pressure returned to baseline and stable Postop Assessment: no signs of nausea or vomiting Anesthetic complications: no    Last Vitals:  Filed Vitals:   11/19/15 1200 11/19/15 1207  BP: 122/68 119/69  Pulse: 54 53  Temp:    Resp: 17 16    Last Pain: There were no vitals filed for this visit.               Martha Clan

## 2015-11-20 LAB — SURGICAL PATHOLOGY

## 2015-11-21 ENCOUNTER — Telehealth: Payer: Self-pay

## 2015-11-21 NOTE — Telephone Encounter (Signed)
Notified patient as instructed, patient pleased. Discussed follow-up appointments, patient agrees. Patient placed in recalls.   

## 2015-11-21 NOTE — Telephone Encounter (Signed)
-----   Message from [name], MD sent at 11/20/2015  2:53 PM EDT ----- Please notify the patient that the polyp removed at his colonoscopy yesterday was benign but he needs to plan on a repeat exam in 5 years. Please put in recalls. ----- Message -----    From: [facility]    Sent: 11/20/2015  11:43 AM      To: [name], MD

## 2015-11-23 ENCOUNTER — Encounter: Payer: Self-pay | Admitting: General Surgery

## 2015-11-24 ENCOUNTER — Other Ambulatory Visit: Payer: Self-pay | Admitting: Cardiovascular Disease

## 2015-12-10 DIAGNOSIS — Z1283 Encounter for screening for malignant neoplasm of skin: Secondary | ICD-10-CM | POA: Diagnosis not present

## 2015-12-10 DIAGNOSIS — L570 Actinic keratosis: Secondary | ICD-10-CM | POA: Diagnosis not present

## 2015-12-22 ENCOUNTER — Encounter: Payer: Self-pay | Admitting: General Surgery

## 2016-01-26 ENCOUNTER — Other Ambulatory Visit: Payer: Self-pay | Admitting: Family Medicine

## 2016-01-27 NOTE — Telephone Encounter (Signed)
appt in 2 weeks Last sgpt reviewed rx approved

## 2016-02-12 ENCOUNTER — Ambulatory Visit (INDEPENDENT_AMBULATORY_CARE_PROVIDER_SITE_OTHER): Payer: Medicare Other | Admitting: Family Medicine

## 2016-02-12 ENCOUNTER — Encounter: Payer: Self-pay | Admitting: Family Medicine

## 2016-02-12 VITALS — BP 122/74 | HR 56 | Temp 97.9°F | Resp 14 | Wt 167.0 lb

## 2016-02-12 DIAGNOSIS — Z23 Encounter for immunization: Secondary | ICD-10-CM | POA: Diagnosis not present

## 2016-02-12 DIAGNOSIS — R7301 Impaired fasting glucose: Secondary | ICD-10-CM

## 2016-02-12 DIAGNOSIS — I4892 Unspecified atrial flutter: Secondary | ICD-10-CM

## 2016-02-12 DIAGNOSIS — Z902 Acquired absence of lung [part of]: Secondary | ICD-10-CM | POA: Diagnosis not present

## 2016-02-12 DIAGNOSIS — Z5181 Encounter for therapeutic drug level monitoring: Secondary | ICD-10-CM | POA: Diagnosis not present

## 2016-02-12 DIAGNOSIS — E782 Mixed hyperlipidemia: Secondary | ICD-10-CM

## 2016-02-12 DIAGNOSIS — N400 Enlarged prostate without lower urinary tract symptoms: Secondary | ICD-10-CM

## 2016-02-12 DIAGNOSIS — I10 Essential (primary) hypertension: Secondary | ICD-10-CM

## 2016-02-12 LAB — COMPLETE METABOLIC PANEL WITH GFR
ALK PHOS: 111 U/L (ref 40–115)
ALT: 18 U/L (ref 9–46)
AST: 27 U/L (ref 10–35)
Albumin: 4.3 g/dL (ref 3.6–5.1)
BILIRUBIN TOTAL: 0.8 mg/dL (ref 0.2–1.2)
BUN: 16 mg/dL (ref 7–25)
CO2: 24 mmol/L (ref 20–31)
Calcium: 9.8 mg/dL (ref 8.6–10.3)
Chloride: 109 mmol/L (ref 98–110)
Creat: 0.88 mg/dL (ref 0.70–1.18)
GFR, EST NON AFRICAN AMERICAN: 85 mL/min (ref >=60)
Glucose, Bld: 104 mg/dL — ABNORMAL HIGH (ref 65–99)
Potassium: 4.8 mmol/L (ref 3.5–5.3)
Sodium: 143 mmol/L (ref 135–146)
TOTAL PROTEIN: 6.5 g/dL (ref 6.1–8.1)

## 2016-02-12 LAB — CBC WITH DIFFERENTIAL/PLATELET
BASOS PCT: 1 %
Basophils Absolute: 75 cells/uL (ref 0–200)
EOS ABS: 225 {cells}/uL (ref 15–500)
Eosinophils Relative: 3 %
HEMATOCRIT: 44.3 % (ref 38.5–50.0)
HEMOGLOBIN: 15 g/dL (ref 13.2–17.1)
LYMPHS ABS: 1125 {cells}/uL (ref 850–3900)
Lymphocytes Relative: 15 %
MCH: 30.4 pg (ref 27.0–33.0)
MCHC: 33.9 g/dL (ref 32.0–36.0)
MCV: 89.7 fL (ref 80.0–100.0)
MONO ABS: 1200 {cells}/uL — AB (ref 200–950)
MPV: 13.2 fL — AB (ref 7.5–12.5)
Monocytes Relative: 16 %
NEUTROS ABS: 4875 {cells}/uL (ref 1500–7800)
Neutrophils Relative %: 65 %
Platelets: 180 10*3/uL (ref 140–400)
RBC: 4.94 MIL/uL (ref 4.20–5.80)
RDW: 14.2 % (ref 11.0–15.0)
WBC: 7.5 10*3/uL (ref 3.8–10.8)

## 2016-02-12 LAB — LIPID PANEL
Cholesterol: 122 mg/dL — ABNORMAL LOW (ref 125–200)
HDL: 36 mg/dL — AB (ref >=40)
LDL CALC: 64 mg/dL (ref <130)
TRIGLYCERIDES: 108 mg/dL (ref <150)
Total CHOL/HDL Ratio: 3.4 Ratio (ref <=5.0)
VLDL: 22 mg/dL (ref <30)

## 2016-02-12 LAB — PSA: PSA: 1.5 ng/mL (ref <=4.0)

## 2016-02-12 NOTE — Assessment & Plan Note (Signed)
Check A1c; limit sugary drinks, white bread

## 2016-02-12 NOTE — Assessment & Plan Note (Addendum)
Paroxysmal; currently sounds to be in sinus rhythm, bradycardic; seeing Dr. Rockey Situ, patient asymptomatic

## 2016-02-12 NOTE — Assessment & Plan Note (Signed)
Check lipids today; limit saturated fats; continue statin

## 2016-02-12 NOTE — Assessment & Plan Note (Signed)
Check liver and kidneys 

## 2016-02-12 NOTE — Patient Instructions (Addendum)
Try to limit saturated fats in your diet (bologna, hot dogs, barbeque, cheeseburgers, hamburgers, steak, bacon, sausage, cheese, etc.) and get more fresh fruits, vegetables, and whole grains  Do get your yearly scans through [place] goal blood pressure is less than 150 mmHg on top. Try to follow the DASH guidelines (DASH stands for Dietary Approaches to Stop Hypertension) Try to limit the sodium in your diet.  Ideally, consume less than 1.5 grams (less than 1,'500mg'$ ) per day. Do not add salt when cooking or at the table.  Check the sodium amount on labels when shopping, and choose items lower in sodium when given a choice. Avoid or limit foods that already contain a lot of sodium. Eat a diet rich in fruits and vegetables and whole grains.

## 2016-02-12 NOTE — Progress Notes (Signed)
BP 122/74   Pulse (!) 56   Temp 97.9 F (36.6 C) (Oral)   Resp 14   Wt 167 lb (75.8 kg)   SpO2 97%   BMI 29.58 kg/m    Subjective:    Patient ID: Jason Malone, male    DOB: [birth year], 75 y.o.   MRN: [id number]  HPI: Jason Malone is a 75 y.o. male  Chief Complaint  Patient presents with  . Follow-up   Patient is here for follow-up with his wife He is feeling "fantastic" HTN; well-controlled, checks at home, [phone] systolic over 55-97 diastlic; eats what he eats; really cut back on salt  Chest surgeon in [place] turned him loose; will get scans once a year through [facility] at the cancer center he says; he has not seen Dr. Faith Rogue in ages; does not see him any more Prostate is doing well; good urine stream; not seeing urologist; last PSA reviewed 2016 Prediabetes; last A1c was 6.3 6 months; no dry mouth, no blurred vision High cholesterol; on statin; last lipids were excellent in April; not limiting bacon or sausage; occasional oatmeal  Depression screen [facility] 2/9 02/12/2016 08/15/2015 02/14/2015  Decreased Interest 0 0 0  Down, Depressed, Hopeless 0 0 0  PHQ - 2 Score 0 0 0   Relevant past medical, surgical, family and social history reviewed Past Medical History:  Diagnosis Date  . Allergic rhinitis   . Benign prostatic hypertrophy with lower urinary tract symptoms (LUTS)   . Chest pain    a. 05/2013 MV: EF 70%, no ischemia.  . Diverticulitis    DIVERTICULOSIS  . Enlarged prostate   . Essential hypertension    CONTROLLED ON MEDS  . Full dentures    upper and lower  . H/O hypokalemia   . Hx of atrial fibrillation without current medication    CARDIOLOGIST-DR [facility]  . Hyperlipemia   . Hypomagnesemia   . IFG (impaired fasting glucose)   . Lung cancer ([name])    a. carcinoid, left lung, Stage 1b (T2a, N0, cM0);  b. 05/2013 s/p VATS & LULobectomy.  . Monocytosis   . Paroxysmal atrial flutter (HCC)    a. 03/2013->no recurrence;  b. CHA2DS2VASc = 2-->not  currently on anticoagulation;  c. 05/2012 Echo: EF 55-60%, normal RV.  [name] Wears glasses    Past Surgical History:  Procedure Laterality Date  . BRONCHOSCOPY     04/06/2013  . CARDIOVASCULAR STRESS TEST  05/2013   a. No evidence of ischemia or infarct, EF 70%, no WMAs  . CATARACT EXTRACTION W/PHACO Left 07/14/2015   Procedure: CATARACT EXTRACTION PHACO AND INTRAOCULAR LENS PLACEMENT (IOC);  Surgeon: [name], MD;  Location: [place];  Service: Ophthalmology;  Laterality: Left;  . CATARACT EXTRACTION W/PHACO Right 10/01/2015   Procedure: CATARACT EXTRACTION PHACO AND INTRAOCULAR LENS PLACEMENT (IOC);  Surgeon: [name], MD;  Location: [place];  Service: Ophthalmology;  Laterality: Right;  . COLONOSCOPY W/ POLYPECTOMY     bleed after-had to go to surgery to stop bleeding via colonoscopy  . COLONOSCOPY WITH PROPOFOL N/A 11/19/2015   Procedure: COLONOSCOPY WITH PROPOFOL;  Surgeon: [name], MD;  Location: [facility];  Service: Endoscopy;  Laterality: N/A;  . DUPUYTREN CONTRACTURE RELEASE  12/15/2011   Procedure: DUPUYTREN CONTRACTURE RELEASE;  Surgeon: [name], MD;  Location: [place];  Service: Orthopedics;  Laterality: Left;  Fasciotomy left ring finger dupuytrens  . LUNG LOBECTOMY  06/10/13   upper  left lung  . MULTIPLE TOOTH EXTRACTIONS    . REMOVAL RETAINED LENS Right 11/17/2015   Procedure: REMOVAL RETAINED LENS FROAGMENTS RIGHT EYE;  Surgeon: [name], MD;  Location: [place];  Service: Ophthalmology;  Laterality: Right;  . TONSILLECTOMY    . UPPER GASTROINTESTINAL ENDOSCOPY  11-03-15   Dr [name]  . VASECTOMY    . VIDEO ASSISTED THORACOSCOPY (VATS)/WEDGE RESECTION Left 06/04/2013   Procedure: VIDEO ASSISTED THORACOSCOPY (VATS)/WEDGE RESECTION;  Surgeon: [name], MD;  Location: [place];  Service: Thoracic;  Laterality: Left;  [name] VIDEO BRONCHOSCOPY N/A 06/04/2013   Procedure: VIDEO  BRONCHOSCOPY;  Surgeon: [name], MD;  Location: [facility] OR;  Service: Thoracic;  Laterality: N/A;   Family History  Problem Relation Age of Onset  . Stroke Mother   . Alzheimer's disease Mother   . Hypertension Sister   . Hyperlipidemia Sister   . Hypertension Brother   . Hyperlipidemia Brother   . Cancer Sister     lung and colon  . Diabetes Brother    Social History  Substance Use Topics  . Smoking status: Former Smoker    Packs/day: 1.00    Years: 40.00    Types: Cigarettes    Quit date: 12/09/1998  . Smokeless tobacco: Never Used  . Alcohol use No   Interim medical history since last visit reviewed. Allergies and medications reviewed  Review of Systems Per HPI unless specifically indicated above     Objective:    BP 122/74   Pulse (!) 56   Temp 97.9 F (36.6 C) (Oral)   Resp 14   Wt 167 lb (75.8 kg)   SpO2 97%   BMI 29.58 kg/m   Wt Readings from Last 3 Encounters:  02/12/16 167 lb (75.8 kg)  11/19/15 161 lb (73 kg)  11/17/15 165 lb (74.8 kg)    Physical Exam  Constitutional: He appears well-developed and well-nourished. No distress.  HENT:  Head: Normocephalic and atraumatic.  Eyes: EOM are normal. No scleral icterus.  Neck: No thyromegaly present.  Cardiovascular: Regular rhythm.  Bradycardia present.   Just under 60 bpm during auscultation  Pulmonary/Chest: Effort normal and breath sounds normal.  Abdominal: Soft. Bowel sounds are normal. He exhibits no distension.  Musculoskeletal: He exhibits no edema.  Neurological: Coordination normal.  Skin: Skin is warm and dry. No pallor.  Psychiatric: He has a normal mood and affect. His behavior is normal. Judgment and thought content normal. His mood appears not anxious. He does not exhibit a depressed mood.      Assessment & Plan:   Problem List Items Addressed This Visit      Cardiovascular and Mediastinum   Essential hypertension - Primary    Well-controlled today      Atrial flutter (HCC)      Paroxysmal; currently sounds to be in sinus rhythm, bradycardic; seeing Dr. Rockey Situ, patient asymptomatic        Endocrine   IFG (impaired fasting glucose)    Check A1c; limit sugary drinks, white bread      Relevant Orders   Hemoglobin A1c     Genitourinary   BPH without urinary obstruction    No sx; continue med; suggest saw [place]; patient opted to have PSA checked      Relevant Orders   PSA     Other   S/P lobectomy of lung    Monitored yearly at the cancer center      Medication monitoring encounter  Check liver and kidneys      Relevant Orders   COMPLETE METABOLIC PANEL WITH GFR   CBC with Differential/Platelet   Hyperlipidemia    Check lipids today; limit saturated fats; continue statin      Relevant Orders   Lipid panel    Other Visit Diagnoses    Needs flu shot       Relevant Orders   Flu vaccine HIGH DOSE PF (Fluzone High dose) (Completed)      Follow up plan: Return in about 6 months (around 08/12/2016) for visit and fasting labs.  An after-visit summary was printed and given to the patient at [place].  Please see the patient instructions which may contain other information and recommendations beyond what is mentioned above in the assessment and plan.   Orders Placed This Encounter  Procedures  . Flu vaccine HIGH DOSE PF (Fluzone High dose)  . PSA  . COMPLETE METABOLIC PANEL WITH GFR  . Lipid panel  . Hemoglobin A1c  . CBC with Differential/Platelet

## 2016-02-12 NOTE — Assessment & Plan Note (Addendum)
No sx; continue med; suggest saw [place]; patient opted to have PSA checked

## 2016-02-12 NOTE — Assessment & Plan Note (Signed)
Well controlled today.

## 2016-02-12 NOTE — Assessment & Plan Note (Signed)
Monitored yearly at the cancer center

## 2016-02-13 LAB — HEMOGLOBIN A1C
Hgb A1c MFr Bld: 5.8 % — ABNORMAL HIGH (ref <5.7)
Mean Plasma Glucose: 120 mg/dL

## 2016-03-14 ENCOUNTER — Other Ambulatory Visit: Payer: Self-pay | Admitting: Nurse Practitioner

## 2016-04-06 ENCOUNTER — Ambulatory Visit (INDEPENDENT_AMBULATORY_CARE_PROVIDER_SITE_OTHER): Payer: Medicare Other | Admitting: Cardiovascular Disease

## 2016-04-06 ENCOUNTER — Encounter: Payer: Self-pay | Admitting: Cardiovascular Disease

## 2016-04-06 VITALS — BP 116/70 | HR 51 | Ht 63.0 in | Wt 167.5 lb

## 2016-04-06 DIAGNOSIS — E782 Mixed hyperlipidemia: Secondary | ICD-10-CM

## 2016-04-06 DIAGNOSIS — Z87891 Personal history of nicotine dependence: Secondary | ICD-10-CM

## 2016-04-06 DIAGNOSIS — R001 Bradycardia, unspecified: Secondary | ICD-10-CM | POA: Diagnosis not present

## 2016-04-06 DIAGNOSIS — I10 Essential (primary) hypertension: Secondary | ICD-10-CM

## 2016-04-06 DIAGNOSIS — Z902 Acquired absence of lung [part of]: Secondary | ICD-10-CM

## 2016-04-06 DIAGNOSIS — I4892 Unspecified atrial flutter: Secondary | ICD-10-CM

## 2016-04-06 DIAGNOSIS — I2510 Atherosclerotic heart disease of native coronary artery without angina pectoris: Secondary | ICD-10-CM | POA: Insufficient documentation

## 2016-04-06 DIAGNOSIS — I700 Atherosclerosis of aorta: Secondary | ICD-10-CM

## 2016-04-06 MED ORDER — AMLODIPINE BESYLATE 2.5 MG PO TABS: 2.5000 mg | ORAL_TABLET | Freq: Every day | ORAL | 4 refills | Status: DC

## 2016-04-06 NOTE — Progress Notes (Signed)
Cardiology Office Note  Date:  04/06/2016   ID:  Jason Malone, DOB 1940/06/03, MRN [id number]  PCP:  [name], MD   Chief Complaint  Patient presents with  . other    1 yr f/u no complaints . Meds reviewed verbally with pt.    HPI:  Jason Malone is a 75 year-old gentleman with long history of smoking,  lung cancer on the left, status post resection January 2015, hypertension, hyperlipidemia who presented to the hospital 03/29/2013 with tachycardia, shortness of breath, atrial flutter. He was treated with rate controlling medications, and converted to normal sinus rhythm that night.significant electrolyte abnormality on arrival to the hospital with potassium 3.1, low magnesium felt secondary to his HCTZ. This was stopped. He presents today for follow-up of his atrial flutter  Reports he is doing well, recovered from his lobectomy for lung cancer on the left  he denies any symptoms concerning for arrhythmia In terms of his risk factors for coronary disease, Stopped smoking 2001 Total chol 122, LDL 64 HBA1C 5.8  No regular exercise program. Family reports he is sedentary Has shortness of breath on exertion, but symptoms stable for the past 2 years since surgery  Previous imaging studies reviewed with him in detail, images pulled up in the office and reviewed CT chest  Mild aortic atherosclerosis, arch, descending aorta, Minimal CAD, calcification aortic valve (?left main)  Prior surgery performed by Dr. Servando Snare   EKG on today's visit shows sinus bradycardia rate 51 bpm, no significant ST or T-wave changes  Other past medical history reviewed Echocardiogram 03/29/2013 shows atrial flutter, ejection fraction 55-60%, otherwise normal study As mentioned, potassium on 03/29/2013 was 3.1 magnesium 1.6, cardiac enzymes negative, TSH 3.5  CT scan of the chest shows 4.6 x 4.1 x 4.2 cm left hilar mass encasing the left pulmonary artery branches Recent lab work showing total cholesterol  171, HDL 39, LDL 86 prior  stress test 05/22/2013 showing no ischemia  EKG today shows normal sinus rhythm with rate 55 beats per minute, no significant ST or T wave changes   PMH:   has a past medical history of Allergic rhinitis; Benign prostatic hypertrophy with lower urinary tract symptoms (LUTS); Chest pain; Diverticulitis; Enlarged prostate; Essential hypertension; Full dentures; H/O hypokalemia; atrial fibrillation without current medication; Hyperlipemia; Hypomagnesemia; IFG (impaired fasting glucose); Lung cancer ([place]); Monocytosis; Paroxysmal atrial flutter ([place]); and Wears glasses.  PSH:    Past Surgical History:  Procedure Laterality Date  . BRONCHOSCOPY     04/06/2013  . CARDIOVASCULAR STRESS TEST  05/2013   a. No evidence of ischemia or infarct, EF 70%, no WMAs  . CATARACT EXTRACTION W/PHACO Left 07/14/2015   Procedure: CATARACT EXTRACTION PHACO AND INTRAOCULAR LENS PLACEMENT (IOC);  Surgeon: [name], MD;  Location: [place];  Service: Ophthalmology;  Laterality: Left;  . CATARACT EXTRACTION W/PHACO Right 10/01/2015   Procedure: CATARACT EXTRACTION PHACO AND INTRAOCULAR LENS PLACEMENT (IOC);  Surgeon: [name], MD;  Location: [place];  Service: Ophthalmology;  Laterality: Right;  . COLONOSCOPY W/ POLYPECTOMY     bleed after-had to go to surgery to stop bleeding via colonoscopy  . COLONOSCOPY WITH PROPOFOL N/A 11/19/2015   Procedure: COLONOSCOPY WITH PROPOFOL;  Surgeon: [name], MD;  [facility]: [facility] ENDOSCOPY;  Service: Endoscopy;  Laterality: N/A;  . DUPUYTREN CONTRACTURE RELEASE  12/15/2011   Procedure: DUPUYTREN CONTRACTURE RELEASE;  Surgeon: [name], MD;  Location: [place];  Service: Orthopedics;  Laterality: Left;  Fasciotomy left ring finger dupuytrens  . LUNG LOBECTOMY  06/10/13   upper left lung  . MULTIPLE TOOTH EXTRACTIONS    . REMOVAL RETAINED LENS Right 11/17/2015   Procedure: REMOVAL RETAINED  LENS FROAGMENTS RIGHT EYE;  Surgeon: [name], MD;  Location: [place];  Service: Ophthalmology;  Laterality: Right;  . TONSILLECTOMY    . UPPER GASTROINTESTINAL ENDOSCOPY  11-03-15   Dr [name]  . VASECTOMY    . VIDEO ASSISTED THORACOSCOPY (VATS)/WEDGE RESECTION Left 06/04/2013   Procedure: VIDEO ASSISTED THORACOSCOPY (VATS)/WEDGE RESECTION;  Surgeon: [name], MD;  Location: [place];  Service: Thoracic;  Laterality: Left;  [name] VIDEO BRONCHOSCOPY N/A 06/04/2013   Procedure: VIDEO BRONCHOSCOPY;  Surgeon: [name], MD;  [facility];  Service: Thoracic;  Laterality: N/A;    Current Outpatient Prescriptions  Medication Sig Dispense Refill  . amLODipine (NORVASC) 2.5 MG tablet Take 1 tablet (2.5 mg total) by mouth daily. 90 tablet 4  . atorvastatin (LIPITOR) 40 MG tablet TAKE ONE TABLET BY MOUTH AT BEDTIME FOR  CHOLESTEROL 90 tablet 1  . b complex vitamins capsule Take 1 capsule by mouth daily. AM    . carvedilol (COREG) 3.125 MG tablet TAKE ONE TABLET BY MOUTH TWICE DAILY (Patient taking differently: TAKE ONE TABLET BY MOUTH TWICE DAILY/ AM AND BEDTIME) 180 tablet 3  . Cinnamon 500 MG capsule Take 500 mg by mouth 2 (two) times daily. AM AND BEDTIME    . finasteride (PROSCAR) 5 MG tablet TAKE ONE TABLET BY MOUTH ONCE DAILY 90 tablet 3  . loratadine (CLARITIN) 10 MG tablet Take 10 mg by mouth daily. AM    . losartan (COZAAR) 100 MG tablet TAKE ONE TABLET BY MOUTH ONCE DAILY 90 tablet 3  . Multiple Vitamins-Minerals (MULTIVITAMIN WITH MINERALS) tablet Take 1 tablet by mouth daily. AM    . polycarbophil (FIBERCON) 625 MG tablet Take 625 mg by mouth daily. AM    . tamsulosin (FLOMAX) 0.4 MG CAPS capsule Take 1 capsule (0.4 mg total) by mouth daily. (Patient taking differently: Take 0.4 mg by mouth daily. BEDTIME) 90 capsule 3  . vitamin C (ASCORBIC ACID) 500 MG tablet Take 500 mg by mouth daily. AM     No current facility-administered medications for this visit.       Allergies:   Latex and Tape   Social History:  The patient  reports that he quit smoking about 17 years ago. His smoking use included Cigarettes. He has a 40.00 pack-year smoking history. He has never used smokeless tobacco. He reports that he does not drink alcohol or use drugs.   Family History:   family history includes Alzheimer's disease in his mother; Cancer in his sister; Diabetes in his brother; Hyperlipidemia in his brother and sister; Hypertension in his brother and sister; Stroke in his mother.    Review of Systems: Review of Systems  Constitutional: Negative.   Respiratory: Negative.   Cardiovascular: Negative.   Gastrointestinal: Negative.   Musculoskeletal: Negative.   Neurological: Negative.   Psychiatric/Behavioral: Negative.   All other systems reviewed and are negative.    PHYSICAL EXAM: VS:  BP 116/70 (BP Location: Left Arm, Patient Position: Sitting, Cuff Size: Normal)   Pulse (!) 51   Ht '5\' 3"'$  (1.6 m)   Wt 167 lb 8 oz (76 kg)   BMI 29.67 kg/m  , BMI Body mass index is 29.67 kg/m. GEN: Well nourished, well developed, in no acute distress  HEENT: normal  Neck: no JVD, carotid bruits, or masses Cardiac: RRR; no murmurs, rubs, or gallops,no edema  Respiratory:  clear to auscultation bilaterally, normal work of breathing GI: soft, nontender, nondistended, + BS MS: no deformity or atrophy  Skin: warm and dry, no rash Neuro:  Strength and sensation are intact Psych: euthymic mood, full affect    Recent Labs: 02/12/2016: ALT 18; BUN 16; Creat 0.88; Hemoglobin 15.0; Platelets 180; Potassium 4.8; Sodium 143    Lipid Panel Lab Results  Component Value Date   CHOL 122 (L) 02/12/2016   HDL 36 (L) 02/12/2016   LDLCALC 64 02/12/2016   TRIG 108 02/12/2016      Wt Readings from Last 3 Encounters:  04/06/16 167 lb 8 oz (76 kg)  02/12/16 167 lb (75.8 kg)  11/19/15 161 lb (73 kg)       ASSESSMENT AND PLAN:  Essential hypertension - Plan: EKG  12-Lead Blood pressure is well controlled on today's visit. No changes made to the medications.  Paroxysmal atrial flutter (HCC) - Plan: EKG 12-Lead Denies any symptoms concerning for arrhythmia No further workup at this time Strongly recommended he monitor heart rate for arrhythmia  Mixed hyperlipidemia Cholesterol is at goal on the current lipid regimen. No changes to the medications were made.  Sinus bradycardia Heart rate 51 bpm, recommended he hold the evening carvedilol Family reports he does have some fatigue  Smoking history  S/P lobectomy of lung  Coronary artery disease involving native heart without angina pectoris, unspecified vessel or lesion type CT scan heart chest reviewed with him, minimal coronary calcifications noted  Aortic atherosclerosis (HCC) Mild diffuse aortic atherosclerosis noted, images reviewed with him   Total encounter time more than 25 minutes  Greater than 50% was spent in counseling and coordination of care with the patient   Disposition:   F/U  6 months   Orders Placed This Encounter  Procedures  . EKG 12-Lead     Signed, Esmond Plants, M.D., Ph.D. 04/06/2016  [place], [place]

## 2016-04-06 NOTE — Patient Instructions (Addendum)
Medication Instructions:   Please consider holding the coreg in the evening  Labwork:  No new labs needed  Testing/Procedures:  No further testing at this time   I recommend watching educational videos on topics of interest to you at:       [email or web address]  Enter code: [email or web address]    Follow-Up: It was a pleasure seeing you in the office today. Please call us if you have new issues that need to be addressed before your next appt.  [phone]  Your physician wants you to follow-up in: 12 months.  You will receive a reminder letter in the mail two months in advance. If you don't receive a letter, please call our office to schedule the follow-up appointment.  If you need a refill on your cardiac medications before your next appointment, please call your pharmacy.

## 2016-04-25 ENCOUNTER — Other Ambulatory Visit: Payer: Self-pay | Admitting: Family Medicine

## 2016-04-29 NOTE — Telephone Encounter (Signed)
Pt checking status on medication refill on tamsulosin. Please call patient once complete [phone]

## 2016-04-29 NOTE — Telephone Encounter (Signed)
sent 

## 2016-06-02 ENCOUNTER — Other Ambulatory Visit: Payer: Self-pay | Admitting: *Deleted

## 2016-06-02 DIAGNOSIS — C3490 Malignant neoplasm of unspecified part of unspecified bronchus or lung: Secondary | ICD-10-CM

## 2016-06-25 ENCOUNTER — Ambulatory Visit
Admission: RE | Admit: 2016-06-25 | Discharge: 2016-06-25 | Disposition: A | Discharge status: Home / Self Care | Payer: Medicare Other | Source: Ambulatory Visit | Attending: Oncology | Admitting: Oncology

## 2016-06-25 DIAGNOSIS — C3490 Malignant neoplasm of unspecified part of unspecified bronchus or lung: Secondary | ICD-10-CM | POA: Diagnosis not present

## 2016-06-25 LAB — POCT I-STAT CREATININE: CREATININE: 0.9 mg/dL (ref 0.61–1.24)

## 2016-06-25 MED ORDER — IOPAMIDOL (ISOVUE-300) INJECTION 61%
75.0000 mL | Freq: Once | INTRAVENOUS | Status: AC | PRN
  Administered 2016-06-25: 75 mL via INTRAVENOUS

## 2016-06-28 DIAGNOSIS — H538 Other visual disturbances: Secondary | ICD-10-CM | POA: Diagnosis not present

## 2016-06-29 DIAGNOSIS — Z85118 Personal history of other malignant neoplasm of bronchus and lung: Secondary | ICD-10-CM | POA: Insufficient documentation

## 2016-06-29 NOTE — Progress Notes (Signed)
[place]  [phone]) [phone] Fax:(336) [phone]  ID: Danne Harbor OB: 04-01-1941  MR#: [id number]  BPZ#:[id number]  Patient Care Team: Arnetha Courser, MD as PCP - General (Family Medicine) [name], MD (General Surgery) Nestor Lewandowsky, MD as Referring Physician (Cardiothoracic Surgery) Minna Merritts, MD as Consulting Physician (Cardiology)  CHIEF COMPLAINT: History of lung cancer  INTERVAL HISTORY: Patient is a 76 year old male who was found to have a left upper lobe mass highly suspicious for underlying malignancy. He underwent lobectomy in [place] on June 04, 2013. Final pathology results revealed a well differentiated neuroendocrine tumor with positive bronchial margins. Patient did not require adjuvant XRT or chemotherapy. He returns to clinic today to reestablish care and discussion of his CT results. He currently feels well and is asymptomatic. He has no neurologic complaints. He denies any recent fevers or illnesses. He is a good appetite and denies weight loss. He denies any chest pain, shortness of breath, hemoptysis, or cough. He has no nausea, vomiting, constipation, or diarrhea. He has no urinary complaints. Patient feels at his baseline and offers no specific complaints today.  REVIEW OF SYSTEMS:   Review of Systems  Constitutional: Negative.  Negative for fever, malaise/fatigue and weight loss.  Respiratory: Negative.  Negative for cough, hemoptysis and shortness of breath.   Cardiovascular: Negative.  Negative for chest pain and leg swelling.  Gastrointestinal: Negative for abdominal pain.  Genitourinary: Negative.   Musculoskeletal: Negative.   Neurological: Negative.  Negative for weakness.  Psychiatric/Behavioral: Negative.  The patient is not nervous/anxious.     As per HPI. Otherwise, a complete review of systems is negative.  PAST MEDICAL HISTORY: Past Medical History:  Diagnosis Date  . Allergic rhinitis   . Benign  prostatic hypertrophy with lower urinary tract symptoms (LUTS)   . Chest pain    a. 05/2013 MV: EF 70%, no ischemia.  . Diverticulitis    DIVERTICULOSIS  . Enlarged prostate   . Essential hypertension    CONTROLLED ON MEDS  . Full dentures    upper and lower  . H/O hypokalemia   . Hx of atrial fibrillation without current medication    CARDIOLOGIST-[facility]  . Hyperlipemia   . Hypomagnesemia   . IFG (impaired fasting glucose)   . Lung cancer ([place])    a. carcinoid, left lung, Stage 1b (T2a, N0, cM0);  b. 05/2013 s/p VATS & LULobectomy.  . Monocytosis   . Paroxysmal atrial flutter (HCC)    a. 03/2013->no recurrence;  b. CHA2DS2VASc = 2-->not currently on anticoagulation;  c. 05/2012 Echo: EF 55-60%, normal RV.  [name] Wears glasses     PAST SURGICAL HISTORY: Past Surgical History:  Procedure Laterality Date  . BRONCHOSCOPY     04/06/2013  . CARDIOVASCULAR STRESS TEST  05/2013   a. No evidence of ischemia or infarct, EF 70%, no WMAs  . CATARACT EXTRACTION W/PHACO Left 07/14/2015   Procedure: CATARACT EXTRACTION PHACO AND INTRAOCULAR LENS PLACEMENT (IOC);  Surgeon: [name], MD;  Location: [place];  Service: Ophthalmology;  Laterality: Left;  . CATARACT EXTRACTION W/PHACO Right 10/01/2015   Procedure: CATARACT EXTRACTION PHACO AND INTRAOCULAR LENS PLACEMENT (IOC);  Surgeon: [name], MD;  Location: [place];  Service: Ophthalmology;  Laterality: Right;  . COLONOSCOPY W/ POLYPECTOMY     bleed after-had to go to surgery to stop bleeding via colonoscopy  . COLONOSCOPY WITH PROPOFOL N/A 11/19/2015   Procedure: COLONOSCOPY WITH PROPOFOL;  Surgeon: [name],  MD;  Location: [facility] ENDOSCOPY;  Service: Endoscopy;  Laterality: N/A;  . DUPUYTREN CONTRACTURE RELEASE  12/15/2011   Procedure: DUPUYTREN CONTRACTURE RELEASE;  Surgeon: [name], MD;  Location: [place];  Service: Orthopedics;  Laterality: Left;  Fasciotomy left ring  finger dupuytrens  . LUNG LOBECTOMY  06/10/13   upper left lung  . MULTIPLE TOOTH EXTRACTIONS    . REMOVAL RETAINED LENS Right 11/17/2015   Procedure: REMOVAL RETAINED LENS FROAGMENTS RIGHT EYE;  Surgeon: [name], MD;  Location: [place];  Service: Ophthalmology;  Laterality: Right;  . TONSILLECTOMY    . UPPER GASTROINTESTINAL ENDOSCOPY  11-03-15   Dr [name]  . VASECTOMY    . VIDEO ASSISTED THORACOSCOPY (VATS)/WEDGE RESECTION Left 06/04/2013   Procedure: VIDEO ASSISTED THORACOSCOPY (VATS)/WEDGE RESECTION;  Surgeon: [name], MD;  Location: [place];  Service: Thoracic;  Laterality: Left;  [name] VIDEO BRONCHOSCOPY N/A 06/04/2013   Procedure: VIDEO BRONCHOSCOPY;  Surgeon: [name], MD;  Location: [facility] OR;  Service: Thoracic;  Laterality: N/A;    FAMILY HISTORY: Family History  Problem Relation Age of Onset  . Stroke Mother   . Alzheimer's disease Mother   . Hypertension Sister   . Hyperlipidemia Sister   . Hypertension Brother   . Hyperlipidemia Brother   . Diabetes Brother   . Cancer Sister     lung and colon    ADVANCED DIRECTIVES (Y/N):  N  HEALTH MAINTENANCE: Social History  Substance Use Topics  . Smoking status: Former Smoker    Packs/day: 1.00    Years: 40.00    Types: Cigarettes    Quit date: 12/09/1998  . Smokeless tobacco: Never Used  . Alcohol use No     Colonoscopy:  PAP:  Bone density:  Lipid panel:  Allergies  Allergen Reactions  . Latex Itching    Tape only- REACTED TO BANDAGE ON ABDOMEN  . Tape Itching    Surgical tapes     Current Outpatient Prescriptions  Medication Sig Dispense Refill  . amLODipine (NORVASC) 2.5 MG tablet Take 1 tablet (2.5 mg total) by mouth daily. 90 tablet 4  . atorvastatin (LIPITOR) 40 MG tablet TAKE ONE TABLET BY MOUTH AT BEDTIME FOR  CHOLESTEROL 90 tablet 1  . b complex vitamins capsule Take 1 capsule by mouth daily. AM    . carvedilol (COREG) 3.125 MG tablet TAKE ONE TABLET BY MOUTH TWICE  DAILY (Patient taking differently: TAKE ONE TABLET BY MOUTH TWICE DAILY/ AM AND BEDTIME) 180 tablet 3  . Cinnamon 500 MG capsule Take 500 mg by mouth 2 (two) times daily. AM AND BEDTIME    . finasteride (PROSCAR) 5 MG tablet TAKE ONE TABLET BY MOUTH ONCE DAILY 90 tablet 3  . loratadine (CLARITIN) 10 MG tablet Take 10 mg by mouth daily. AM    . losartan (COZAAR) 100 MG tablet TAKE ONE TABLET BY MOUTH ONCE DAILY 90 tablet 3  . Multiple Vitamins-Minerals (MULTIVITAMIN WITH MINERALS) tablet Take 1 tablet by mouth daily. AM    . polycarbophil (FIBERCON) 625 MG tablet Take 625 mg by mouth daily. AM    . tamsulosin (FLOMAX) 0.4 MG CAPS capsule TAKE ONE CAPSULE BY MOUTH ONCE DAILY 90 capsule 3  . vitamin C (ASCORBIC ACID) 500 MG tablet Take 500 mg by mouth daily. AM    . oseltamivir (TAMIFLU) 75 MG capsule Take 1 capsule (75 mg total) by mouth daily. 10 capsule 0   No current facility-administered medications for this visit.  OBJECTIVE: Vitals:   06/30/16 1100  BP: (!) 162/84  Pulse: (!) 59  Temp: 97.4 F (36.3 C)     Body mass index is 29.62 kg/m.    ECOG FS:0 - Asymptomatic  General: Well-developed, well-nourished, no acute distress. Eyes: Pink conjunctiva, anicteric sclera. HEENT: Normocephalic, moist mucous membranes, clear oropharnyx. Lungs: Clear to auscultation bilaterally. Heart: Regular rate and rhythm. No rubs, murmurs, or gallops. Abdomen: Soft, nontender, nondistended. No organomegaly noted, normoactive bowel sounds. Musculoskeletal: No edema, cyanosis, or clubbing. Neuro: Alert, answering all questions appropriately. Cranial nerves grossly intact. Skin: No rashes or petechiae noted. Psych: Normal affect. Lymphatics: No cervical, calvicular, axillary or inguinal LAD.   LAB RESULTS:  Lab Results  Component Value Date   NA 143 02/12/2016   K 4.8 02/12/2016   CL 109 02/12/2016   CO2 24 02/12/2016   GLUCOSE 104 (H) 02/12/2016   BUN 16 02/12/2016   CREATININE 0.90  06/25/2016   CALCIUM 9.8 02/12/2016   PROT 6.5 02/12/2016   ALBUMIN 4.3 02/12/2016   AST 27 02/12/2016   ALT 18 02/12/2016   ALKPHOS 111 02/12/2016   BILITOT 0.8 02/12/2016   GFRNONAA 85 02/12/2016   GFRAA >89 02/12/2016    Lab Results  Component Value Date   WBC 7.5 02/12/2016   NEUTROABS 4,875 02/12/2016   HGB 15.0 02/12/2016   HCT 44.3 02/12/2016   MCV 89.7 02/12/2016   PLT 180 02/12/2016     STUDIES: Ct Chest W Contrast  Result Date: 06/25/2016 CLINICAL DATA:  Restaging lung cancer. EXAM: CT CHEST WITH CONTRAST TECHNIQUE: Multidetector CT imaging of the chest was performed during intravenous contrast administration. CONTRAST:  43m ISOVUE-300 IOPAMIDOL (ISOVUE-300) INJECTION 61% COMPARISON:  CT scan 06/26/2015 FINDINGS: Chest wall: No chest wall mass, supraclavicular or axillary lymphadenopathy. The thyroid gland appears normal. Cardiovascular: The heart is normal in size. No pericardial effusion. Stable tortuosity, ectasia and atherosclerotic calcifications involving the aorta. No dissection. The branch vessels are patent. Stable coronary artery calcifications. Mediastinum/Nodes: No mediastinal or hilar mass or adenopathy. The esophagus is grossly normal. Lungs/Pleura: Stable surgical changes from a left upper lobe lobectomy. No findings for recurrent tumor. No acute pulmonary findings. No evidence of pulmonary metastatic disease. No pleural effusion. Upper Abdomen: No significant upper abdominal findings. No evidence of hepatic or adrenal gland metastasis. Stable cholelithiasis. Musculoskeletal: No significant bony findings. IMPRESSION: Stable CT appearance of the chest. No findings for recurrent disease, mediastinal/hilar adenopathy or pulmonary metastatic disease. Electronically Signed   By: PMarijo SanesM.D.   On: 06/25/2016 14:44    ASSESSMENT: History of lung cancer  PLAN:    1. History of lung cancer: Patient's pathology, imaging, and outside facility notes reviewed  extensively. [place] Final pathology results revealed a well differentiated neuroendocrine tumor with positive bronchial margins. Patient did not require adjuvant XRT or chemotherapy. CT scan results from June 25, 2016 reviewed independently and reported as above with no obvious evidence of recurrent or metastatic disease. No intervention is needed at this time. We will continue with simple observation using imaging on a yearly basis until patient is 5 years removed from his surgery which will be January 2019. Return to clinic in 1 year for further evaluation.  Patient expressed understanding and was in agreement with this plan. He also understands that He can call clinic at any time with any questions, concerns, or complaints.   Cancer Staging No matching staging information was found  for the patient.  Lloyd Huger, MD   07/05/2016 7:50 PM

## 2016-06-30 ENCOUNTER — Encounter: Payer: Self-pay | Admitting: Oncology

## 2016-06-30 ENCOUNTER — Inpatient Hospital Stay: Payer: Medicare Other | Attending: Oncology | Admitting: Oncology

## 2016-06-30 DIAGNOSIS — I700 Atherosclerosis of aorta: Secondary | ICD-10-CM | POA: Diagnosis not present

## 2016-06-30 DIAGNOSIS — N400 Enlarged prostate without lower urinary tract symptoms: Secondary | ICD-10-CM | POA: Insufficient documentation

## 2016-06-30 DIAGNOSIS — E785 Hyperlipidemia, unspecified: Secondary | ICD-10-CM | POA: Insufficient documentation

## 2016-06-30 DIAGNOSIS — Z85118 Personal history of other malignant neoplasm of bronchus and lung: Secondary | ICD-10-CM | POA: Diagnosis not present

## 2016-06-30 DIAGNOSIS — Z79899 Other long term (current) drug therapy: Secondary | ICD-10-CM | POA: Insufficient documentation

## 2016-06-30 DIAGNOSIS — I4891 Unspecified atrial fibrillation: Secondary | ICD-10-CM | POA: Diagnosis not present

## 2016-06-30 DIAGNOSIS — Z87891 Personal history of nicotine dependence: Secondary | ICD-10-CM | POA: Diagnosis not present

## 2016-06-30 DIAGNOSIS — I10 Essential (primary) hypertension: Secondary | ICD-10-CM | POA: Diagnosis not present

## 2016-06-30 DIAGNOSIS — K8020 Calculus of gallbladder without cholecystitis without obstruction: Secondary | ICD-10-CM | POA: Diagnosis not present

## 2016-07-02 ENCOUNTER — Other Ambulatory Visit: Payer: Self-pay | Admitting: Family Medicine

## 2016-07-02 MED ORDER — OSELTAMIVIR PHOSPHATE 75 MG PO CAPS: 75.0000 mg | ORAL_CAPSULE | Freq: Every day | ORAL | 0 refills | Status: DC

## 2016-07-02 NOTE — Progress Notes (Signed)
tamiflu for prevention

## 2016-07-20 ENCOUNTER — Other Ambulatory Visit: Payer: Self-pay | Admitting: Family Medicine

## 2016-07-20 NOTE — Telephone Encounter (Signed)
Last SGPT and lipids reviewed Rx approved 

## 2016-08-13 ENCOUNTER — Ambulatory Visit (INDEPENDENT_AMBULATORY_CARE_PROVIDER_SITE_OTHER): Payer: Medicare Other | Admitting: Family Medicine

## 2016-08-13 ENCOUNTER — Encounter: Payer: Self-pay | Admitting: Family Medicine

## 2016-08-13 DIAGNOSIS — R748 Abnormal levels of other serum enzymes: Secondary | ICD-10-CM | POA: Diagnosis not present

## 2016-08-13 DIAGNOSIS — R7301 Impaired fasting glucose: Secondary | ICD-10-CM | POA: Diagnosis not present

## 2016-08-13 DIAGNOSIS — E782 Mixed hyperlipidemia: Secondary | ICD-10-CM

## 2016-08-13 DIAGNOSIS — I10 Essential (primary) hypertension: Secondary | ICD-10-CM | POA: Diagnosis not present

## 2016-08-13 DIAGNOSIS — N400 Enlarged prostate without lower urinary tract symptoms: Secondary | ICD-10-CM

## 2016-08-13 DIAGNOSIS — I4892 Unspecified atrial flutter: Secondary | ICD-10-CM

## 2016-08-13 DIAGNOSIS — K625 Hemorrhage of anus and rectum: Secondary | ICD-10-CM | POA: Diagnosis not present

## 2016-08-13 DIAGNOSIS — Z5181 Encounter for therapeutic drug level monitoring: Secondary | ICD-10-CM | POA: Diagnosis not present

## 2016-08-13 DIAGNOSIS — I2510 Atherosclerotic heart disease of native coronary artery without angina pectoris: Secondary | ICD-10-CM

## 2016-08-13 DIAGNOSIS — J3089 Other allergic rhinitis: Secondary | ICD-10-CM

## 2016-08-13 NOTE — Assessment & Plan Note (Signed)
Check lipids, limit fats

## 2016-08-13 NOTE — Assessment & Plan Note (Signed)
Check labs 

## 2016-08-13 NOTE — Assessment & Plan Note (Signed)
Excellent PSA in October

## 2016-08-13 NOTE — Assessment & Plan Note (Signed)
Colonoscopy UTD; hemorrhoids; increase water intake, fiber intake

## 2016-08-13 NOTE — Progress Notes (Signed)
BP 126/78   Pulse 70   Temp 97.7 F (36.5 C) (Oral)   Resp 16   Wt 167 lb 8 oz (76 kg)   SpO2 95%   BMI 29.67 kg/m    Subjective:    Patient ID: Jason Malone, male    DOB: 24-May-1940, 76 y.o.   MRN: [id number]  HPI: Jason Malone is a 76 y.o. male  Chief Complaint  Patient presents with  . Follow-up   He is here with wife for follow-up Hypertension; avoiding excessive salt, still puts a little salt on the food; doing a little better Hyperlipidemia; last 3 LDLs all under 70; there is room for improvement in his diet Taking statin  Lab Results  Component Value Date   CHOL 138 08/13/2016   CHOL 122 (L) 02/12/2016   CHOL 129 08/15/2015   Lab Results  Component Value Date   HDL 37 (L) 08/13/2016   HDL 36 (L) 02/12/2016   HDL 40 08/15/2015   Lab Results  Component Value Date   LDLCALC 75 08/13/2016   LDLCALC 64 02/12/2016   LDLCALC 65 08/15/2015   Lab Results  Component Value Date   TRIG 132 08/13/2016   TRIG 108 02/12/2016   TRIG 122 08/15/2015   Lab Results  Component Value Date   CHOLHDL 3.7 08/13/2016   CHOLHDL 3.4 02/12/2016   No results found for: LDLDIRECT  Turned loose by chest surgeon for hx of carcinoid of the lung  Atrial flutter; sees cardiologist; taking carvedilol just once a day instead of twice a day per cardiologist; he was already on 3.125 mg and had to reduce b/c heart rate was 51; bradycardia now resolved  Allergies, worse in the spring and fall; pollen, plain allergic medicine  Prediabetes; has a sweet tooth at times Lab Results  Component Value Date   HGBA1C 6.1 (H) 08/13/2016     Depression screen PHQ 2/9 08/13/2016 02/12/2016 08/15/2015 02/14/2015  Decreased Interest 0 0 0 0  Down, Depressed, Hopeless 0 0 0 0  PHQ - 2 Score 0 0 0 0   Relevant past medical, surgical, family and social history reviewed Past Medical History:  Diagnosis Date  . Allergic rhinitis   . Benign prostatic hypertrophy with lower urinary tract symptoms  (LUTS)   . Chest pain    a. 05/2013 MV: EF 70%, no ischemia.  . Diverticulitis    DIVERTICULOSIS  . Enlarged prostate   . Essential hypertension    CONTROLLED ON MEDS  . Full dentures    upper and lower  . H/O hypokalemia   . Hx of atrial fibrillation without current medication    CARDIOLOGIST-DR [facility]  . Hyperlipemia   . Hypomagnesemia   . IFG (impaired fasting glucose)   . Lung cancer ([place])    a. carcinoid, left lung, Stage 1b (T2a, N0, cM0);  b. 05/2013 s/p VATS & LULobectomy.  . Monocytosis   . Paroxysmal atrial flutter (HCC)    a. 03/2013->no recurrence;  b. CHA2DS2VASc = 2-->not currently on anticoagulation;  c. 05/2012 Echo: EF 55-60%, normal RV.  [name] Wears glasses    Past Surgical History:  Procedure Laterality Date  . BRONCHOSCOPY     04/06/2013  . CARDIOVASCULAR STRESS TEST  05/2013   a. No evidence of ischemia or infarct, EF 70%, no WMAs  . CATARACT EXTRACTION W/PHACO Left 07/14/2015   Procedure: CATARACT EXTRACTION PHACO AND INTRAOCULAR LENS PLACEMENT (IOC);  Surgeon: [name], MD;  Location: [place]  [facility];  Service: Ophthalmology;  Laterality: Left;  . CATARACT EXTRACTION W/PHACO Right 10/01/2015   Procedure: CATARACT EXTRACTION PHACO AND INTRAOCULAR LENS PLACEMENT (IOC);  Surgeon: Lockie Mola, MD;  Location: [facility];  Service: Ophthalmology;  Laterality: Right;  . COLONOSCOPY W/ POLYPECTOMY     bleed after-had to go to surgery to stop bleeding via colonoscopy  . COLONOSCOPY WITH PROPOFOL N/A 11/19/2015   Procedure: COLONOSCOPY WITH PROPOFOL;  Surgeon: [name], MD;  Location: [facility] END[facility];  Service: Endoscopy;  Laterality: N/A;  . DUPUYTREN CONTRACTURE RELEASE  12/15/2011   Procedure: DUPUYTREN CONTRACTURE RELEASE;  Surgeon: Nicki Reaper, MD;  Location: [facility];  Service: Orthopedics;  Laterality: Left;  Fasciotomy left ring finger dupuytrens  . LUNG LOBECTOMY  06/10/13   upper left lung  . MULTIPLE  TOOTH EXTRACTIONS    . REMOVAL RETAINED LENS Right 11/17/2015   Procedure: REMOVAL RETAINED LENS FROAGMENTS RIGHT EYE;  Surgeon: Lockie Mola, MD;  Location: [facility];  Service: Ophthalmology;  Laterality: Right;  . TONSILLECTOMY    . UPPER GASTROINTESTINAL ENDOSCOPY  11-03-15   Dr Lemar Livings  . VASECTOMY    . VIDEO ASSISTED THORACOSCOPY (VATS)/WEDGE RESECTION Left 06/04/2013   Procedure: VIDEO ASSISTED THORACOSCOPY (VATS)/WEDGE RESECTION;  Surgeon: Delight Ovens, MD;  Location: [facility] OR;  Service: Thoracic;  Laterality: Left;  [name] VIDEO BRONCHOSCOPY N/A 06/04/2013   Procedure: VIDEO BRONCHOSCOPY;  Surgeon: Delight Ovens, MD;  Location: [facility] OR;  Service: Thoracic;  Laterality: N/A;   Family History  Problem Relation Age of Onset  . Stroke Mother   . Alzheimer's disease Mother   . Hypertension Sister   . Hyperlipidemia Sister   . Hypertension Brother   . Hyperlipidemia Brother   . Diabetes Brother   . Cancer Sister     lung and colon   Social History  Substance Use Topics  . Smoking status: Former Smoker    Packs/day: 1.00    Years: 40.00    Types: Cigarettes    Quit date: 12/09/1998  . Smokeless tobacco: Never Used  . Alcohol use No    Interim medical history since last visit reviewed. Allergies and medications reviewed  Review of Systems  Constitutional: Negative for unexpected weight change.  Cardiovascular: Negative for chest pain.  Gastrointestinal: Positive for blood in stool (hemorrhoids; saw surgeon, had colonoscopy).   Per HPI unless specifically indicated above     Objective:    BP 126/78   Pulse 70   Temp 97.7 F (36.5 C) (Oral)   Resp 16   Wt 167 lb 8 oz (76 kg)   SpO2 95%   BMI 29.67 kg/m   Wt Readings from Last 3 Encounters:  08/13/16 167 lb 8 oz (76 kg)  06/30/16 167 lb 3.2 oz (75.8 kg)  04/06/16 167 lb 8 oz (76 kg)    Physical Exam  Constitutional: He appears well-developed and well-nourished. No distress.  HENT:  Head:  Normocephalic and atraumatic.  Eyes: EOM are normal. No scleral icterus.  Neck: No thyromegaly present.  Cardiovascular: Normal rate and regular rhythm.   Pulmonary/Chest: Effort normal and breath sounds normal.  Abdominal: Soft. Bowel sounds are normal. He exhibits no distension.  Musculoskeletal: He exhibits no edema.  Neurological: Coordination normal.  Skin: Skin is warm and dry. No pallor.  Psychiatric: He has a normal mood and affect. His behavior is normal. Judgment and thought content normal. His mood appears not anxious. He does not exhibit a depressed mood.  Assessment & Plan:   Problem List Items Addressed This Visit      Cardiovascular and Mediastinum   Essential hypertension    Well-controlled; try DASH guidelines      Coronary artery disease involving native heart without angina pectoris    Goal LDL met under 70; continue statin; seeing Dr. Rockey Situ      Atrial flutter [facility])    Managed by cardiologist        Respiratory   Allergic rhinitis    Avoid decongestants        Digestive   Rectal bleeding    Colonoscopy UTD; hemorrhoids; increase water intake, fiber intake        Endocrine   IFG (impaired fasting glucose)    Check glucose and A1c; suggested fewer sweets      Relevant Orders   Hemoglobin A1c (Completed)     Genitourinary   BPH without urinary obstruction    Excellent PSA in October        Other   Medication monitoring encounter    Check labs      Relevant Orders   Comprehensive Metabolic Panel (CMET) (Completed)   Hyperlipidemia    Check lipids, limit fats      Relevant Orders   Lipid panel (Completed)      Follow up plan: Return in about 6 months (around 02/12/2017) for twenty minute follow-up with fasting labs.  An after-visit summary was printed and given to the patient at [place].  Please see the patient instructions which may contain other information and recommendations beyond what is mentioned above in the  assessment and plan.  No orders of the defined types were placed in this encounter.   Orders Placed This Encounter  Procedures  . Comprehensive Metabolic Panel (CMET)  . Lipid panel  . Hemoglobin A1c

## 2016-08-13 NOTE — Patient Instructions (Signed)
Consider saw [place] Let's get labs today Try to limit saturated fats in your diet (bologna, hot dogs, barbeque, cheeseburgers, hamburgers, steak, bacon, sausage, cheese, etc.) and get more fresh fruits, vegetables, and whole grains Limit sweets

## 2016-08-13 NOTE — Assessment & Plan Note (Signed)
Well-controlled; try DASH guidelines

## 2016-08-13 NOTE — Assessment & Plan Note (Signed)
Avoid decongestants 

## 2016-08-13 NOTE — Assessment & Plan Note (Signed)
Check glucose and A1c; suggested fewer sweets

## 2016-08-13 NOTE — Assessment & Plan Note (Signed)
Managed by cardiologist 

## 2016-08-13 NOTE — Assessment & Plan Note (Signed)
Goal LDL met under 70; continue statin; seeing Dr. Rockey Situ

## 2016-08-14 ENCOUNTER — Telehealth: Payer: Self-pay | Admitting: Family Medicine

## 2016-08-14 DIAGNOSIS — R748 Abnormal levels of other serum enzymes: Secondary | ICD-10-CM

## 2016-08-14 LAB — COMPREHENSIVE METABOLIC PANEL
ALBUMIN: 4.4 g/dL (ref 3.5–4.8)
ALK PHOS: 160 IU/L — AB (ref 39–117)
ALT: 17 IU/L (ref 0–44)
AST: 19 IU/L (ref 0–40)
Albumin/Globulin Ratio: 1.9 (ref 1.2–2.2)
BUN / CREAT RATIO: 18 (ref 10–24)
BUN: 18 mg/dL (ref 8–27)
Bilirubin Total: 0.6 mg/dL (ref 0.0–1.2)
CO2: 26 mmol/L (ref 18–29)
CREATININE: 0.98 mg/dL (ref 0.76–1.27)
Calcium: 10.1 mg/dL (ref 8.6–10.2)
Chloride: 102 mmol/L (ref 96–106)
GFR calc non Af Amer: 76 mL/min/{1.73_m2} (ref >59)
GFR, EST AFRICAN AMERICAN: 88 mL/min/{1.73_m2} (ref >59)
GLOBULIN, TOTAL: 2.3 g/dL (ref 1.5–4.5)
Glucose: 117 mg/dL — ABNORMAL HIGH (ref 65–99)
Potassium: 5.1 mmol/L (ref 3.5–5.2)
Sodium: 143 mmol/L (ref 134–144)
Total Protein: 6.7 g/dL (ref 6.0–8.5)

## 2016-08-14 LAB — LIPID PANEL
CHOL/HDL RATIO: 3.7 ratio (ref 0.0–5.0)
Cholesterol, Total: 138 mg/dL (ref 100–199)
HDL: 37 mg/dL — AB (ref >39)
LDL CALC: 75 mg/dL (ref 0–99)
TRIGLYCERIDES: 132 mg/dL (ref 0–149)
VLDL CHOLESTEROL CAL: 26 mg/dL (ref 5–40)

## 2016-08-14 LAB — HEMOGLOBIN A1C
Est. average glucose Bld gHb Est-mCnc: 128 mg/dL
Hgb A1c MFr Bld: 6.1 % — ABNORMAL HIGH (ref 4.8–5.6)

## 2016-08-14 NOTE — Telephone Encounter (Signed)
Note sent to pt about labs [facility] staff, please have [facility] add on alk phos isoenzymes to blood in lab Thank you

## 2016-08-16 NOTE — Telephone Encounter (Signed)
Lab has been added on.

## 2016-08-18 ENCOUNTER — Other Ambulatory Visit: Payer: Self-pay | Admitting: Family Medicine

## 2016-08-18 DIAGNOSIS — R748 Abnormal levels of other serum enzymes: Secondary | ICD-10-CM

## 2016-08-18 NOTE — Progress Notes (Signed)
Orders entered for RUQ [place] Try to get done in the next week

## 2016-08-19 ENCOUNTER — Telehealth: Payer: Self-pay

## 2016-08-19 NOTE — Telephone Encounter (Signed)
Patient was informed that he has been scheduled to have a ultrasound for 08/24/16 @ 9am at [facility]. Patient was instructed not have anything to eat or drink after midnight and to arrive 15 mins early.

## 2016-08-24 ENCOUNTER — Ambulatory Visit
Admission: RE | Admit: 2016-08-24 | Discharge: 2016-08-24 | Disposition: A | Discharge status: Home / Self Care | Payer: Medicare Other | Source: Ambulatory Visit | Attending: Family Medicine | Admitting: Family Medicine

## 2016-08-24 DIAGNOSIS — R7989 Other specified abnormal findings of blood chemistry: Secondary | ICD-10-CM | POA: Diagnosis not present

## 2016-08-24 DIAGNOSIS — K760 Fatty (change of) liver, not elsewhere classified: Secondary | ICD-10-CM | POA: Insufficient documentation

## 2016-08-24 DIAGNOSIS — R748 Abnormal levels of other serum enzymes: Secondary | ICD-10-CM

## 2016-08-24 DIAGNOSIS — R160 Hepatomegaly, not elsewhere classified: Secondary | ICD-10-CM | POA: Insufficient documentation

## 2016-08-24 DIAGNOSIS — K8020 Calculus of gallbladder without cholecystitis without obstruction: Secondary | ICD-10-CM | POA: Diagnosis not present

## 2016-08-28 ENCOUNTER — Encounter: Payer: Self-pay | Admitting: Family Medicine

## 2016-08-28 ENCOUNTER — Other Ambulatory Visit: Payer: Self-pay | Admitting: Family Medicine

## 2016-08-28 DIAGNOSIS — R748 Abnormal levels of other serum enzymes: Secondary | ICD-10-CM

## 2016-08-28 DIAGNOSIS — R160 Hepatomegaly, not elsewhere classified: Secondary | ICD-10-CM

## 2016-08-28 DIAGNOSIS — Z85110 Personal history of malignant carcinoid tumor of bronchus and lung: Secondary | ICD-10-CM

## 2016-08-28 HISTORY — DX: Personal history of malignant carcinoid tumor of bronchus and lung: Z85.110

## 2016-08-28 HISTORY — DX: Hepatomegaly, not elsewhere classified: R16.0

## 2016-08-28 NOTE — Assessment & Plan Note (Signed)
Probable hemangioma, but will radiology report says "query"; will get MRI of liver

## 2016-08-28 NOTE — Progress Notes (Signed)
I entered MRI of liver; GI referral vs surg or onc referral after results are back

## 2016-08-30 ENCOUNTER — Telehealth: Payer: Self-pay

## 2016-08-30 NOTE — Telephone Encounter (Signed)
Patient was informed that he has been scheduled to have his MRI on 09/07/16 @ 1:00pm at [facility]. He was instructed to arrive 18mns early and not to have anything to eat or drink 4 hours prior.

## 2016-09-07 ENCOUNTER — Ambulatory Visit
Admission: RE | Admit: 2016-09-07 | Discharge: 2016-09-07 | Disposition: A | Discharge status: Home / Self Care | Payer: Medicare Other | Source: Ambulatory Visit | Attending: Family Medicine | Admitting: Family Medicine

## 2016-09-07 DIAGNOSIS — D7389 Other diseases of spleen: Secondary | ICD-10-CM | POA: Insufficient documentation

## 2016-09-07 DIAGNOSIS — K8020 Calculus of gallbladder without cholecystitis without obstruction: Secondary | ICD-10-CM | POA: Diagnosis not present

## 2016-09-07 DIAGNOSIS — Z85110 Personal history of malignant carcinoid tumor of bronchus and lung: Secondary | ICD-10-CM | POA: Diagnosis not present

## 2016-09-07 DIAGNOSIS — R748 Abnormal levels of other serum enzymes: Secondary | ICD-10-CM | POA: Diagnosis present

## 2016-09-07 DIAGNOSIS — R160 Hepatomegaly, not elsewhere classified: Secondary | ICD-10-CM | POA: Insufficient documentation

## 2016-09-07 LAB — ALKALINE PHOSPHATASE, ISOENZYMES
Alkaline Phosphatase: 160 IU/L — ABNORMAL HIGH (ref 39–117)
BONE FRACTION: 17 % (ref 12–68)
INTESTINAL FRAC.: 0 % (ref 0–18)
LIVER FRACTION: 83 % (ref 13–88)

## 2016-09-07 LAB — SPECIMEN STATUS REPORT

## 2016-09-07 MED ORDER — GADOBENATE DIMEGLUMINE 529 MG/ML IV SOLN
15.0000 mL | Freq: Once | INTRAVENOUS | Status: AC | PRN
  Administered 2016-09-07: 15 mL via INTRAVENOUS

## 2016-10-05 ENCOUNTER — Other Ambulatory Visit: Payer: Self-pay | Admitting: Family Medicine

## 2016-10-05 DIAGNOSIS — R160 Hepatomegaly, not elsewhere classified: Secondary | ICD-10-CM

## 2016-10-05 NOTE — Assessment & Plan Note (Signed)
MRI on December 08, 2016

## 2016-11-30 ENCOUNTER — Other Ambulatory Visit: Payer: Self-pay | Admitting: Cardiovascular Disease

## 2016-11-30 ENCOUNTER — Telehealth: Payer: Self-pay | Admitting: Family Medicine

## 2016-11-30 DIAGNOSIS — R160 Hepatomegaly, not elsewhere classified: Secondary | ICD-10-CM

## 2016-11-30 NOTE — Assessment & Plan Note (Signed)
Will order MRI of liver

## 2016-11-30 NOTE — Telephone Encounter (Signed)
Liver MRI already ordered, scheduled for 12/03/16

## 2016-11-30 NOTE — Telephone Encounter (Signed)
-----   Message from Arnetha Courser, MD sent at 09/08/2016  4:39 PM EDT ----- Regarding: MRI liver, 3 month f/u Re-order MRI liver for August 1st

## 2016-12-02 ENCOUNTER — Other Ambulatory Visit: Payer: Self-pay

## 2016-12-02 DIAGNOSIS — R160 Hepatomegaly, not elsewhere classified: Secondary | ICD-10-CM

## 2016-12-03 ENCOUNTER — Ambulatory Visit
Admission: RE | Admit: 2016-12-03 | Discharge: 2016-12-03 | Disposition: A | Discharge status: Home / Self Care | Payer: Medicare Other | Source: Ambulatory Visit | Attending: Family Medicine | Admitting: Family Medicine

## 2016-12-03 ENCOUNTER — Other Ambulatory Visit
Admission: RE | Admit: 2016-12-03 | Discharge: 2016-12-03 | Disposition: A | Discharge status: Home / Self Care | Payer: Medicare Other | Source: Ambulatory Visit | Attending: Family Medicine | Admitting: Family Medicine

## 2016-12-03 DIAGNOSIS — D7389 Other diseases of spleen: Secondary | ICD-10-CM | POA: Diagnosis not present

## 2016-12-03 DIAGNOSIS — R160 Hepatomegaly, not elsewhere classified: Secondary | ICD-10-CM | POA: Diagnosis not present

## 2016-12-03 DIAGNOSIS — K8020 Calculus of gallbladder without cholecystitis without obstruction: Secondary | ICD-10-CM | POA: Diagnosis not present

## 2016-12-03 DIAGNOSIS — R7989 Other specified abnormal findings of blood chemistry: Secondary | ICD-10-CM | POA: Diagnosis not present

## 2016-12-03 LAB — BASIC METABOLIC PANEL
Anion gap: 7 (ref 5–15)
BUN: 18 mg/dL (ref 6–20)
CHLORIDE: 106 mmol/L (ref 101–111)
CO2: 27 mmol/L (ref 22–32)
CREATININE: 1.03 mg/dL (ref 0.61–1.24)
Calcium: 9.6 mg/dL (ref 8.9–10.3)
GFR calc Af Amer: >60 mL/min (ref >60)
GFR calc non Af Amer: >60 mL/min (ref >60)
GLUCOSE: 121 mg/dL — AB (ref 65–99)
POTASSIUM: 4.3 mmol/L (ref 3.5–5.1)
SODIUM: 140 mmol/L (ref 135–145)

## 2016-12-03 MED ORDER — GADOBENATE DIMEGLUMINE 529 MG/ML IV SOLN
15.0000 mL | Freq: Once | INTRAVENOUS | Status: AC | PRN
  Administered 2016-12-03: 15 mL via INTRAVENOUS

## 2016-12-04 ENCOUNTER — Other Ambulatory Visit: Payer: Self-pay | Admitting: Family Medicine

## 2016-12-04 DIAGNOSIS — R160 Hepatomegaly, not elsewhere classified: Secondary | ICD-10-CM

## 2016-12-04 NOTE — Progress Notes (Signed)
I spoke with pt, wife in the background; discussed results of MRI We optioned for biopsy rather than wait another 3 months He agrees Will refer

## 2016-12-07 DIAGNOSIS — L570 Actinic keratosis: Secondary | ICD-10-CM | POA: Diagnosis not present

## 2016-12-08 DIAGNOSIS — C229 Malignant neoplasm of liver, not specified as primary or secondary: Secondary | ICD-10-CM

## 2016-12-08 HISTORY — DX: Malignant neoplasm of liver, not specified as primary or secondary: C22.9

## 2016-12-13 ENCOUNTER — Other Ambulatory Visit: Payer: Self-pay | Admitting: Family Medicine

## 2016-12-13 ENCOUNTER — Other Ambulatory Visit: Payer: Self-pay

## 2016-12-13 DIAGNOSIS — R160 Hepatomegaly, not elsewhere classified: Secondary | ICD-10-CM

## 2016-12-17 ENCOUNTER — Other Ambulatory Visit: Payer: Self-pay | Admitting: Radiology

## 2016-12-17 ENCOUNTER — Ambulatory Visit
Admission: RE | Admit: 2016-12-17 | Discharge: 2016-12-17 | Disposition: A | Discharge status: Home / Self Care | Payer: Medicare Other | Source: Ambulatory Visit | Attending: Family Medicine | Admitting: Family Medicine

## 2016-12-17 ENCOUNTER — Encounter: Payer: Self-pay | Admitting: Family Medicine

## 2016-12-17 ENCOUNTER — Other Ambulatory Visit: Payer: Self-pay | Admitting: Physician Assistant

## 2016-12-17 ENCOUNTER — Ambulatory Visit (INDEPENDENT_AMBULATORY_CARE_PROVIDER_SITE_OTHER): Payer: Medicare Other | Admitting: Family Medicine

## 2016-12-17 DIAGNOSIS — M545 Low back pain, unspecified: Secondary | ICD-10-CM | POA: Insufficient documentation

## 2016-12-17 DIAGNOSIS — Z85110 Personal history of malignant carcinoid tumor of bronchus and lung: Secondary | ICD-10-CM | POA: Diagnosis not present

## 2016-12-17 DIAGNOSIS — R748 Abnormal levels of other serum enzymes: Secondary | ICD-10-CM | POA: Diagnosis not present

## 2016-12-17 DIAGNOSIS — M5136 Other intervertebral disc degeneration, lumbar region: Secondary | ICD-10-CM | POA: Insufficient documentation

## 2016-12-17 DIAGNOSIS — G8929 Other chronic pain: Secondary | ICD-10-CM

## 2016-12-17 DIAGNOSIS — R160 Hepatomegaly, not elsewhere classified: Secondary | ICD-10-CM | POA: Diagnosis not present

## 2016-12-17 NOTE — Assessment & Plan Note (Signed)
Possible spinal stenosis versus or also DDD or lumbar arthritis; get an xray; also check urine to r/o blood

## 2016-12-17 NOTE — Patient Instructions (Signed)
Please have the labs done Have the xray done I'll contact you after speaking with Dr. Grayland Ormond

## 2016-12-17 NOTE — Assessment & Plan Note (Signed)
Will ask Dr. Grayland Ormond if lesion in the liver could be recurrence; if so, does he agree with biopsy given that the first carcinoid took two biopsies to confirm

## 2016-12-17 NOTE — Assessment & Plan Note (Signed)
Recheck that, and undergoing liver biopsy

## 2016-12-17 NOTE — Assessment & Plan Note (Signed)
Recheck of the liver MRI showed lesion slightly larger

## 2016-12-17 NOTE — Progress Notes (Signed)
BP 122/68   Pulse 68   Temp (!) 97.4 F (36.3 C) (Oral)   Resp 14   Wt 159 lb (72.1 kg)   SpO2 96%   BMI 28.17 kg/m    Subjective:    Patient ID: Jason Malone, male    DOB: 05/17/1940, 76 y.o.   MRN: [id number]  HPI: UGOCHUKWU CHICHESTER is a 76 y.o. male  Chief Complaint  Patient presents with  . Follow-up    Exam before pt biopsy     HPI Patient is here for an H&P prior to liver biopsy planned on Monday; he has a 3.3 cm mass in the left lobe of the liver Back pain, lower back No fevers No B/B dysfunction He walks on the treadmill 2x a day, 20 minutes each time; no pain Painful when walking otherwise; but treadmill no pain No heaviness of the legs No abdominal pain No skin infections No cough No chest pain Bruising on sun exposed forearms No burning with urination No pain over the liver; no loss of appetite No jaundice  Prediabetes; last A1c 6.1 in April next due in October  He has lost 8 pounds since last visit and says it is all expected, hard work, not just falling off  Dr. Rockey Situ reduced his coreg to just once a day  Depression screen [facility] 2/9 12/17/2016 08/13/2016 02/12/2016 08/15/2015 02/14/2015  Decreased Interest 0 0 0 0 0  Down, Depressed, Hopeless 0 0 0 0 0  PHQ - 2 Score 0 0 0 0 0    Relevant past medical, surgical, family and social history reviewed Past Medical History:  Diagnosis Date  . Allergic rhinitis   . Benign prostatic hypertrophy with lower urinary tract symptoms (LUTS)   . Chest pain    a. 05/2013 MV: EF 70%, no ischemia.  . Diverticulitis    DIVERTICULOSIS  . Enlarged prostate   . Essential hypertension    CONTROLLED ON MEDS  . Full dentures    upper and lower  . H/O hypokalemia   . Hx of atrial fibrillation without current medication    CARDIOLOGIST-DR [facility]  . 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 Hyperlipemia   . Hypomagnesemia   . IFG (impaired fasting glucose)   . Liver mass, left lobe  08/28/2016   Probably hemangioma; will get MRI of liver, refer to GI  . Lung cancer ([place])    a. carcinoid, left lung, Stage 1b (T2a, N0, cM0);  b. 05/2013 s/p VATS & LULobectomy.  . Monocytosis   . Paroxysmal atrial flutter (HCC)    a. 03/2013->no recurrence;  b. CHA2DS2VASc = 2-->not currently on anticoagulation;  c. 05/2012 Echo: EF 55-60%, normal RV.  [name] Wears glasses    Past Surgical History:  Procedure Laterality Date  . BRONCHOSCOPY     04/06/2013  . CARDIOVASCULAR STRESS TEST  05/2013   a. No evidence of ischemia or infarct, EF 70%, no WMAs  . CATARACT EXTRACTION W/PHACO Left 07/14/2015   Procedure: CATARACT EXTRACTION PHACO AND INTRAOCULAR LENS PLACEMENT (IOC);  Surgeon: [name], MD;  Location: [place];  Service: Ophthalmology;  Laterality: Left;  . CATARACT EXTRACTION W/PHACO Right 10/01/2015   Procedure: CATARACT EXTRACTION PHACO AND INTRAOCULAR LENS PLACEMENT (IOC);  Surgeon: [name], MD;  Location: [place];  Service: Ophthalmology;  Laterality: Right;  . COLONOSCOPY W/ POLYPECTOMY     bleed after-had to go to surgery to stop bleeding via colonoscopy  .  COLONOSCOPY WITH PROPOFOL N/A 11/19/2015   Procedure: COLONOSCOPY WITH PROPOFOL;  Surgeon: [name], MD;  Location: [facility] ENDOSCOPY;  Service: Endoscopy;  Laterality: N/A;  . DUPUYTREN CONTRACTURE RELEASE  12/15/2011   Procedure: DUPUYTREN CONTRACTURE RELEASE;  Surgeon: [name], MD;  Location: [place];  Service: Orthopedics;  Laterality: Left;  Fasciotomy left ring finger dupuytrens  . LUNG LOBECTOMY  06/10/13   upper left lung  . MULTIPLE TOOTH EXTRACTIONS    . REMOVAL RETAINED LENS Right 11/17/2015   Procedure: REMOVAL RETAINED LENS FROAGMENTS RIGHT EYE;  Surgeon: [name], MD;  Location: [place];  Service: Ophthalmology;  Laterality: Right;  . TONSILLECTOMY    . UPPER GASTROINTESTINAL ENDOSCOPY  11-03-15   Dr [name]  . VASECTOMY    .  VIDEO ASSISTED THORACOSCOPY (VATS)/WEDGE RESECTION Left 06/04/2013   Procedure: VIDEO ASSISTED THORACOSCOPY (VATS)/WEDGE RESECTION;  Surgeon: [name], MD;  Location: [place];  Service: Thoracic;  Laterality: Left;  [name] VIDEO BRONCHOSCOPY N/A 06/04/2013   Procedure: VIDEO BRONCHOSCOPY;  Surgeon: [name], MD;  Location: [facility];  Service: Thoracic;  Laterality: N/A;   Family History  Problem Relation Age of Onset  . Stroke Mother   . Alzheimer's disease Mother   . Hypertension Sister   . Hyperlipidemia Sister   . Hypertension Brother   . Hyperlipidemia Brother   . Diabetes Brother   . Cancer Sister        lung and colon   Social History   Social History  . Marital status: Married    Spouse name: N/A  . Number of children: N/A  . Years of education: N/A   Occupational History  . Not on file.   Social History Main Topics  . Smoking status: Former Smoker    Packs/day: 1.00    Years: 40.00    Types: Cigarettes    Quit date: 12/09/1998  . Smokeless tobacco: Never Used  . Alcohol use No  . Drug use: No  . Sexual activity: Yes   Other Topics Concern  . Not on file   Social History Narrative  . No narrative on file    Interim medical history since last visit reviewed. Allergies and medications reviewed  Review of Systems  Constitutional: Positive for unexpected weight change. Negative for fever.  Cardiovascular: Negative for chest pain.  Gastrointestinal: Negative for abdominal pain.  Musculoskeletal:       Back pain   Per HPI unless specifically indicated above     Objective:    BP 122/68   Pulse 68   Temp (!) 97.4 F (36.3 C) (Oral)   Resp 14   Wt 159 lb (72.1 kg)   SpO2 96%   BMI 28.17 kg/m   Wt Readings from Last 3 Encounters:  12/17/16 159 lb (72.1 kg)  08/13/16 167 lb 8 oz (76 kg)  06/30/16 167 lb 3.2 oz (75.8 kg)    Physical Exam  Constitutional: He appears well-developed and well-nourished. No distress.  HENT:  Head: Normocephalic  and atraumatic.  Eyes: EOM are normal. No scleral icterus.  Neck: No thyromegaly present.  Cardiovascular: Normal rate and regular rhythm.   Pulmonary/Chest: Effort normal and breath sounds normal.  Abdominal: Soft. Bowel sounds are normal. He exhibits no distension.  Musculoskeletal: He exhibits no edema.       Lumbar back: He exhibits no tenderness, no bony tenderness and no deformity.  Neurological: Coordination normal.  Skin: Skin is warm and dry. No  bruising and no rash noted. No pallor.  Psychiatric: He has a normal mood and affect. His behavior is normal. Judgment and thought content normal. His mood appears not anxious. He does not exhibit a depressed mood.      Assessment & Plan:   Problem List Items Addressed This Visit      Other   Low back pain    Possible spinal stenosis versus or also DDD or lumbar arthritis; get an xray; also check urine to r/o blood      Relevant Orders   DG Lumbar Spine Complete (Completed)   Urinalysis, Routine w reflex microscopic (Completed)   Liver mass, left lobe    Recheck of the liver MRI showed lesion slightly larger      Relevant Orders   Comprehensive metabolic panel (Completed)   CBC with Differential/Platelet (Completed)   INR/PT (Completed)   PTT (Completed)   Urinalysis, Routine w reflex microscopic (Completed)   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    Will ask Dr. Grayland Ormond if lesion in the liver could be recurrence; if so, does he agree with biopsy given that the first carcinoid took two biopsies to confirm      Elevated serum alkaline phosphatase level    Recheck that, and undergoing liver biopsy      Relevant Orders   Alkaline Phosphatase, Isoenzymes (Completed)       Follow up plan: No Follow-up on file.  An after-visit summary was printed and given to the patient at [place].  Please see the patient instructions which may contain other information and recommendations beyond what is mentioned above  in the assessment and plan.  Meds ordered this encounter  Medications  . carvedilol (COREG) 3.125 MG tablet    Sig: TAKE ONE TABLET BY MOUTH ONCE DAILY IN AM    Once a day per Dr. Rockey Situ    Orders Placed This Encounter  Procedures  . DG Lumbar Spine Complete  . Comprehensive metabolic panel  . CBC with Differential/Platelet  . INR/PT  . PTT  . Urinalysis, Routine w reflex microscopic  . Alkaline Phosphatase, Isoenzymes   Addendum, Friday afternoon: I spoke with oncologist who agrees with proceeding with liver biopsy; discussed the hx of lung tumor and rarity of mets, but he did have positive margins; could be another process; will proceed with biopsy; I spoke with patient after blood and xrays done

## 2016-12-20 ENCOUNTER — Ambulatory Visit
Admission: RE | Admit: 2016-12-20 | Discharge: 2016-12-20 | Disposition: A | Discharge status: Home / Self Care | Payer: Medicare Other | Source: Ambulatory Visit | Attending: Family Medicine | Admitting: Family Medicine

## 2016-12-20 ENCOUNTER — Telehealth: Payer: Self-pay | Admitting: Family Medicine

## 2016-12-20 DIAGNOSIS — Z87891 Personal history of nicotine dependence: Secondary | ICD-10-CM | POA: Insufficient documentation

## 2016-12-20 DIAGNOSIS — E785 Hyperlipidemia, unspecified: Secondary | ICD-10-CM | POA: Insufficient documentation

## 2016-12-20 DIAGNOSIS — R160 Hepatomegaly, not elsewhere classified: Secondary | ICD-10-CM

## 2016-12-20 DIAGNOSIS — Z79899 Other long term (current) drug therapy: Secondary | ICD-10-CM | POA: Insufficient documentation

## 2016-12-20 DIAGNOSIS — Z85110 Personal history of malignant carcinoid tumor of bronchus and lung: Secondary | ICD-10-CM | POA: Insufficient documentation

## 2016-12-20 DIAGNOSIS — I10 Essential (primary) hypertension: Secondary | ICD-10-CM | POA: Diagnosis not present

## 2016-12-20 DIAGNOSIS — C7B02 Secondary carcinoid tumors of liver: Secondary | ICD-10-CM | POA: Insufficient documentation

## 2016-12-20 DIAGNOSIS — C787 Secondary malignant neoplasm of liver and intrahepatic bile duct: Secondary | ICD-10-CM | POA: Diagnosis not present

## 2016-12-20 MED ORDER — MIDAZOLAM HCL 2 MG/2ML IJ SOLN
INTRAMUSCULAR | Status: AC
  Filled 2016-12-20: qty 4

## 2016-12-20 MED ORDER — MIDAZOLAM HCL 2 MG/2ML IJ SOLN
INTRAMUSCULAR | Status: AC | PRN
  Administered 2016-12-20 (×2): 1 mg via INTRAVENOUS

## 2016-12-20 MED ORDER — FENTANYL CITRATE (PF) 100 MCG/2ML IJ SOLN
INTRAMUSCULAR | Status: AC
  Filled 2016-12-20: qty 4

## 2016-12-20 MED ORDER — SODIUM CHLORIDE 0.9 % IV SOLN
INTRAVENOUS | Status: DC
  Administered 2016-12-20: 11:00:00 via INTRAVENOUS

## 2016-12-20 MED ORDER — FENTANYL CITRATE (PF) 100 MCG/2ML IJ SOLN
INTRAMUSCULAR | Status: AC | PRN
  Administered 2016-12-20 (×2): 50 ug via INTRAVENOUS

## 2016-12-20 NOTE — Telephone Encounter (Signed)
Patient underwent liver biopsy today Please call later this afternoon and just see how he's doing Thank you

## 2016-12-20 NOTE — H&P (Signed)
Chief Complaint: Patient was seen in consultation today for liver mass at the request of Lada,Melinda P  Referring Physician(s): Lada,Melinda P   Patient Status: [facility] - Out-pt  History of Present Illness: Jason Malone is a 76 y.o. male with a past history of malignant endobronchial carcinoid.  By imaging, he now has an enlarging lesion in his left hepatic lobe which was indeterminate on MRI.  Today, he is asymptomatic and in his usual state of health.     Past Medical History:  Diagnosis Date  . Allergic rhinitis   . Benign prostatic hypertrophy with lower urinary tract symptoms (LUTS)   . Chest pain    a. 05/2013 MV: EF 70%, no ischemia.  . Diverticulitis    DIVERTICULOSIS  . Enlarged prostate   . Essential hypertension    CONTROLLED ON MEDS  . Full dentures    upper and lower  . H/O hypokalemia   . Hx of atrial fibrillation without current medication    CARDIOLOGIST-DR [facility]  . 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 Hyperlipemia   . Hypomagnesemia   . IFG (impaired fasting glucose)   . Liver mass, left lobe 08/28/2016   Probably hemangioma; will get MRI of liver, refer to GI  . Lung cancer ([place])    a. carcinoid, left lung, Stage 1b (T2a, N0, cM0);  b. 05/2013 s/p VATS & LULobectomy.  . Monocytosis   . Paroxysmal atrial flutter ([facility])    a. 03/2013->no recurrence;  b. CHA2DS2VASc = 2-->not currently on anticoagulation;  c. 05/2012 Echo: EF 55-60%, normal RV.  [name] Wears glasses     Past Surgical History:  Procedure Laterality Date  . BRONCHOSCOPY     04/06/2013  . CARDIOVASCULAR STRESS TEST  05/2013   a. No evidence of ischemia or infarct, EF 70%, no WMAs  . CATARACT EXTRACTION W/PHACO Left 07/14/2015   Procedure: CATARACT EXTRACTION PHACO AND INTRAOCULAR LENS PLACEMENT (IOC);  Surgeon: [name], MD;  Location: [place];  Service: Ophthalmology;  Laterality: Left;  . CATARACT EXTRACTION W/PHACO Right 10/01/2015   Procedure: CATARACT EXTRACTION PHACO AND INTRAOCULAR LENS PLACEMENT (IOC);  Surgeon: [name], MD;  Location: [place];  Service: Ophthalmology;  Laterality: Right;  . COLONOSCOPY W/ POLYPECTOMY     bleed after-had to go to surgery to stop bleeding via colonoscopy  . COLONOSCOPY WITH PROPOFOL N/A 11/19/2015   Procedure: COLONOSCOPY WITH PROPOFOL;  Surgeon: [name], MD;  Location: [facility] END[facility]COPY;  Service: Endoscopy;  Laterality: N/A;  . DUPUYTREN CONTRACTURE RELEASE  12/15/2011   Procedure: DUPUYTREN CONTRACTURE RELEASE;  Surgeon: [name], MD;  Location: [place];  Service: Orthopedics;  Laterality: Left;  Fasciotomy left ring finger dupuytrens  . LUNG LOBECTOMY  06/10/13   upper left lung  . MULTIPLE TOOTH EXTRACTIONS    . REMOVAL RETAINED LENS Right 11/17/2015   Procedure: REMOVAL RETAINED LENS FROAGMENTS RIGHT EYE;  Surgeon: [name], MD;  Location: [place];  Service: Ophthalmology;  Laterality: Right;  . TONSILLECTOMY    . UPPER GASTROINTESTINAL ENDOSCOPY  11-03-15   Dr [name]  . VASECTOMY    . VIDEO ASSISTED THORACOSCOPY (VATS)/WEDGE RESECTION Left 06/04/2013   Procedure: VIDEO ASSISTED THORACOSCOPY (VATS)/WEDGE RESECTION;  Surgeon: [name], MD;  Location: [place];  Service: Thoracic;  Laterality: Left;  [name] VIDEO BRONCHOSCOPY N/A 06/04/2013   Procedure: VIDEO BRONCHOSCOPY;  Surgeon: [name], MD;  Location: [place];  Service: Thoracic;  Laterality: N/A;    Allergies: Latex and Tape  Medications: Prior to Admission medications   Medication Sig Start Date End Date Taking? Authorizing Provider  amLODipine (NORVASC) 2.5 MG tablet Take 1 tablet (2.5 mg total) by mouth daily. 04/06/16  Yes Gollan, Kathlene November, MD  atorvastatin (LIPITOR) 40 MG tablet TAKE 1 TABLET BY MOUTH AT BEDTIME FOR  CHOLESTEROL 10/05/16  Yes Lada, Satira Anis, MD  b complex vitamins capsule Take 1 capsule by mouth daily. AM   Yes [provider]  carvedilol (COREG) 3.125 MG tablet TAKE ONE TABLET BY MOUTH ONCE DAILY IN AM 12/17/16  Yes Lada, Satira Anis, MD  Cinnamon 500 MG capsule Take 500 mg by mouth 2 (two) times daily. AM AND BEDTIME   Yes [provider]  finasteride (PROSCAR) 5 MG tablet TAKE ONE TABLET BY MOUTH ONCE DAILY 10/05/16  Yes Lada, Satira Anis, MD  loratadine (CLARITIN) 10 MG tablet Take 10 mg by mouth daily. AM   Yes [provider]  losartan (COZAAR) 100 MG tablet TAKE ONE TABLET BY MOUTH ONCE DAILY 11/30/16  Yes Gollan, Kathlene November, MD  tamsulosin (FLOMAX) 0.4 MG CAPS capsule TAKE ONE CAPSULE BY MOUTH ONCE DAILY 04/29/16  Yes Lada, Satira Anis, MD  Multiple Vitamins-Minerals (MULTIVITAMIN WITH MINERALS) tablet Take 1 tablet by mouth daily. AM    [provider]  polycarbophil (FIBERCON) 625 MG tablet Take 625 mg by mouth daily. AM    [provider]  vitamin C (ASCORBIC ACID) 500 MG tablet Take 500 mg by mouth daily. AM    [provider]     Family History  Problem Relation Age of Onset  . Stroke Mother   . Alzheimer's disease Mother   . Hypertension Sister   . Hyperlipidemia Sister   . Hypertension Brother   . Hyperlipidemia Brother   . Diabetes Brother   . Cancer Sister        lung and colon    Social History   Social History  . Marital status: Married    Spouse name: N/A  . Number of children: N/A  . Years of education: N/A   Social History Main Topics  . Smoking status: Former Smoker    Packs/day: 1.00    Years: 40.00    Types: Cigarettes    Quit date: 12/09/1998  . Smokeless tobacco: Never Used  . Alcohol use No  . Drug use: No  . Sexual activity: Yes   Other Topics Concern  . Not on file   Social History Narrative  . No narrative on file    ECOG Status: 0 - Asymptomatic  Review of Systems: A 12 point ROS discussed and pertinent positives are indicated in the HPI above.  All other systems are negative.  Review of Systems  Vital  Signs: BP 135/74   Pulse (!) 57   SpO2 96%   Physical Exam  Mallampati Score:  MD Evaluation Airway: WNL Heart: WNL Abdomen: WNL Chest/ Lungs: WNL ASA  Classification: 2 Mallampati/Airway Score: Two  Imaging: Dg Lumbar Spine Complete  Result Date: 12/17/2016 CLINICAL DATA:  Worsening chronic low back pain with walking and standing EXAM: LUMBAR SPINE - COMPLETE 4+ VIEW COMPARISON:  None FINDINGS: Diffuse osseous demineralization. Five non-rib-bearing lumbar vertebra. Multilevel disc space narrowing and endplate spur formation greatest at L4-L5. Facet degenerative changes lower lumbar spine. No acute fracture, subluxation or bone destruction. SI joints unremarkable. Atherosclerotic calcification aorta and iliac arteries. IMPRESSION: Osseous demineralization  with degenerative changes of the lumbar spine. No acute abnormalities. Electronically Signed   By: Lavonia Dana M.D.   On: 12/17/2016 18:41   Mr Liver W Wo Contrast  Result Date: 12/03/2016 CLINICAL DATA:  Followup indeterminate liver lesion. Elevated liver function tests. Personal history of lung carcinoma. EXAM: MRI ABDOMEN WITHOUT AND WITH CONTRAST TECHNIQUE: Multiplanar multisequence MR imaging of the abdomen was performed both before and after the administration of intravenous contrast. CONTRAST:  71mL MULTIHANCE GADOBENATE DIMEGLUMINE 529 MG/ML IV SOLN COMPARISON:  09/07/2016 FINDINGS: Lower chest: No acute findings. Hepatobiliary: Respiratory motion artifact noted. A well-circumscribed lesion is again seen in segment 3 of the left lobe, which shows moderate T2 hyperintensity and mild diffuse homogeneous contrast enhancement. This lesion is unchanged in appearance, but currently measures 3.3 x 2.3 cm on image 11/20, compared to 2.7 x 2.5 cm previously. This lesion shows no evidence of restricted diffusion. No other liver masses are identified. Tiny gallstone noted, without evidence of cholecystitis. No evidence of biliary duct  dilatation. Pancreas:  No mass or inflammatory changes. Spleen: Stable 1.5 cm T2 hyperintense lesion which shows enhancement which is virtually isointense with normal splenic parenchyma. This most likely represents a benign lymphangioma or hemangioma. No other splenic lesions or splenomegaly. Adrenals/Urinary Tract: No masses identified. No evidence of hydronephrosis. Stomach/Bowel: Diverticulosis involving visualized portions of left colon. No diverticulitis seen in this area. Vascular/Lymphatic: No pathologically enlarged lymph nodes identified. No abdominal aortic aneurysm. Other:  None. Musculoskeletal:  No suspicious bone lesions identified. IMPRESSION: Nonspecific 3.3 cm mass in left hepatic lobe measures slightly larger in size compared to previous study. Consider percutaneous needle biopsy or continued followup by MRI in 3 months. Cholelithiasis. No evidence of cholecystitis or biliary ductal dilatation. Stable small splenic lesion, likely representing a benign lymphangioma or hemangioma. Continued attention recommended on follow-up imaging. Electronically Signed   By: Earle Gell M.D.   On: 12/03/2016 14:17    Labs:  CBC:  Recent Labs  02/12/16 1103 12/17/16 1423  WBC 7.5 8.4  HGB 15.0 14.5  HCT 44.3 44.1  PLT 180 218    COAGS:  Recent Labs  12/17/16 1423  INR 1.0  APTT 30    BMP:  Recent Labs  02/12/16 1103 06/25/16 1146 08/13/16 1051 12/03/16 1026 12/17/16 1423  NA 143  --  143 140 143  K 4.8  --  5.1 4.3 4.6  CL 109  --  102 106 105  CO2 24  --  26 27 24   GLUCOSE 104*  --  117* 121* 83  BUN 16  --  18 18 17   CALCIUM 9.8  --  10.1 9.6 9.7  CREATININE 0.88 0.90 0.98 1.03 0.95  GFRNONAA 85  --  76 >60 79  GFRAA >89  --  88 >60 91    LIVER FUNCTION TESTS:  Recent Labs  02/12/16 1103 08/13/16 1051 12/17/16 1423  BILITOT 0.8 0.6 0.5  AST 27 19 23   ALT 18 17 18   ALKPHOS 111 160*  160* 134*  PROT 6.5 6.7 6.6  ALBUMIN 4.3 4.4 4.4    TUMOR MARKERS: No  results for input(s): AFPTM, CEA, CA199, CHROMGRNA in the last 8760 hours.  Assessment and Plan:  77 year old gentleman with an enlarging, but nonspecific (by MRI) left hepatic lesion.  He has a past history of malignant carcinoid of the lung.  Do to the risk of a possible metastatic lesion, image guided biopsy is warranted.   1.) US guided  liver biopsy of liver lesion.   Thank you for this interesting consult.  I greatly enjoyed meeting Jason Malone and look forward to participating in their care.  A copy of this report was sent to the requesting provider on this date.  Electronically Signed: Jacqulynn Cadet, MD 12/20/2016, 10:41 AM   I spent a total of  30 Minutes  in face to face in clinical consultation, greater than 50% of which was counseling/coordinating care for liver lesion.

## 2016-12-20 NOTE — Procedures (Signed)
Interventional Radiology Procedure Note  Procedure: US guided core biopsy of liver lesion  Complications: None  Estimated Blood Loss: <25 mL  Recommendations: - DC home after 3 hrs  Signed,  Criselda Peaches, MD

## 2016-12-20 NOTE — Telephone Encounter (Signed)
Left detailed voicemail

## 2016-12-20 NOTE — Telephone Encounter (Signed)
Patient called states Jason Malone is doing well and procedure went well.

## 2016-12-20 NOTE — Discharge Instructions (Signed)
Moderate Conscious Sedation, Adult, Care After These instructions provide you with information about caring for yourself after your procedure. Your health care provider may also give you more specific instructions. Your treatment has been planned according to current medical practices, but problems sometimes occur. Call your health care provider if you have any problems or questions after your procedure. What can I expect after the procedure? After your procedure, it is common:  To feel sleepy for several hours.  To feel clumsy and have poor balance for several hours.  To have poor judgment for several hours.  To vomit if you eat too soon.  Follow these instructions at home: For at least 24 hours after the procedure:   Do not: ? Participate in activities where you could fall or become injured. ? Drive. ? Use heavy machinery. ? Drink alcohol. ? Take sleeping pills or medicines that cause drowsiness. ? Make important decisions or sign legal documents. ? Take care of children on your own.  Rest. Eating and drinking  Follow the diet recommended by your health care provider.  If you vomit: ? Drink water, juice, or soup when you can drink without vomiting. ? Make sure you have little or no nausea before eating solid foods. General instructions  Have a responsible adult stay with you until you are awake and alert.  Take over-the-counter and prescription medicines only as told by your health care provider.  If you smoke, do not smoke without supervision.  Keep all follow-up visits as told by your health care provider. This is important. Contact a health care provider if:  You keep feeling nauseous or you keep vomiting.  You feel light-headed.  You develop a rash.  You have a fever. Get help right away if:  You have trouble breathing. This information is not intended to replace advice given to you by your health care provider. Make sure you discuss any questions you have  with your health care provider. Document Released: 02/14/2013 Document Revised: 09/29/2015 Document Reviewed: 08/16/2015 Elsevier Interactive Patient Education  2018 [place]. Needle Biopsy, Care After Refer to this sheet in the next few weeks. These instructions provide you with information about caring for yourself after your procedure. Your health care provider may also give you more specific instructions. Your treatment has been planned according to current medical practices, but problems sometimes occur. Call your health care provider if you have any problems or questions after your procedure. What can I expect after the procedure? After your procedure, it is common to have soreness, bruising, or mild pain at the biopsy site. This should go away in a few days. Follow these instructions at home:  Rest as directed by your health care provider.  Take medicines only as directed by your health care provider.  There are many different ways to close and cover the biopsy site, including stitches (sutures), skin glue, and adhesive strips. Follow your health care provider's instructions about: ? Biopsy site care. ? Bandage (dressing) changes and removal. ? Biopsy site closure removal.  Check your biopsy site every day for signs of infection. Watch for: ? Redness, swelling, or pain. ? Fluid, blood, or pus. Contact a health care provider if:  You have a fever.  You have redness, swelling, or pain at the biopsy site that lasts longer than a few days.  You have fluid, blood, or pus coming from the biopsy site.  You feel nauseous.  You vomit. Get help right away if:  You have shortness of  breath.  You have trouble breathing.  You have chest pain.  You feel dizzy or you faint.  You have bleeding that does not stop with pressure or a bandage.  You cough up blood.  You have pain in your abdomen. This information is not intended to replace advice given to you by your health care  provider. Make sure you discuss any questions you have with your health care provider. Document Released: 09/10/2014 Document Revised: 10/02/2015 Document Reviewed: 04/22/2014 Elsevier Interactive Patient Education  [employer].

## 2016-12-21 LAB — CBC WITH DIFFERENTIAL/PLATELET
BASOS ABS: 0.1 10*3/uL (ref 0.0–0.2)
Basos: 1 %
EOS (ABSOLUTE): 0.2 10*3/uL (ref 0.0–0.4)
Eos: 3 %
Hematocrit: 44.1 % (ref 37.5–51.0)
Hemoglobin: 14.5 g/dL (ref 13.0–17.7)
Immature Grans (Abs): 0 10*3/uL (ref 0.0–0.1)
Immature Granulocytes: 0 %
LYMPHS ABS: 1.1 10*3/uL (ref 0.7–3.1)
Lymphs: 13 %
MCH: 28.7 pg (ref 26.6–33.0)
MCHC: 32.9 g/dL (ref 31.5–35.7)
MCV: 87 fL (ref 79–97)
MONOS ABS: 1.5 10*3/uL — AB (ref 0.1–0.9)
Monocytes: 17 %
Neutrophils Absolute: 5.6 10*3/uL (ref 1.4–7.0)
Neutrophils: 66 %
Platelets: 218 10*3/uL (ref 150–379)
RBC: 5.05 x10E6/uL (ref 4.14–5.80)
RDW: 14.1 % (ref 12.3–15.4)
WBC: 8.4 10*3/uL (ref 3.4–10.8)

## 2016-12-21 LAB — COMPREHENSIVE METABOLIC PANEL
ALBUMIN: 4.4 g/dL (ref 3.5–4.8)
ALT: 18 IU/L (ref 0–44)
AST: 23 IU/L (ref 0–40)
Albumin/Globulin Ratio: 2 (ref 1.2–2.2)
Alkaline Phosphatase: 134 IU/L — ABNORMAL HIGH (ref 39–117)
BILIRUBIN TOTAL: 0.5 mg/dL (ref 0.0–1.2)
BUN / CREAT RATIO: 18 (ref 10–24)
BUN: 17 mg/dL (ref 8–27)
CHLORIDE: 105 mmol/L (ref 96–106)
CO2: 24 mmol/L (ref 20–29)
Calcium: 9.7 mg/dL (ref 8.6–10.2)
Creatinine, Ser: 0.95 mg/dL (ref 0.76–1.27)
GFR calc Af Amer: 91 mL/min/{1.73_m2} (ref >59)
GFR calc non Af Amer: 79 mL/min/{1.73_m2} (ref >59)
GLOBULIN, TOTAL: 2.2 g/dL (ref 1.5–4.5)
GLUCOSE: 83 mg/dL (ref 65–99)
POTASSIUM: 4.6 mmol/L (ref 3.5–5.2)
SODIUM: 143 mmol/L (ref 134–144)
Total Protein: 6.6 g/dL (ref 6.0–8.5)

## 2016-12-21 LAB — URINALYSIS, ROUTINE W REFLEX MICROSCOPIC
Bilirubin, UA: NEGATIVE
Glucose, UA: NEGATIVE
Ketones, UA: NEGATIVE
LEUKOCYTES UA: NEGATIVE
Nitrite, UA: NEGATIVE
PH UA: 5 (ref 5.0–7.5)
PROTEIN UA: NEGATIVE
RBC, UA: NEGATIVE
Specific Gravity, UA: 1.025 (ref 1.005–1.030)
Urobilinogen, Ur: 0.2 mg/dL (ref 0.2–1.0)

## 2016-12-21 LAB — PROTIME-INR
INR: 1 (ref 0.8–1.2)
PROTHROMBIN TIME: 10.4 s (ref 9.1–12.0)

## 2016-12-21 LAB — APTT: aPTT: 30 s (ref 24–33)

## 2016-12-21 LAB — ALKALINE PHOSPHATASE, ISOENZYMES
BONE FRACTION: 14 % (ref 12–68)
INTESTINAL FRAC.: 4 % (ref 0–18)
LIVER FRACTION: 82 % (ref 13–88)

## 2016-12-22 ENCOUNTER — Other Ambulatory Visit: Payer: Self-pay | Admitting: Pathology

## 2016-12-23 LAB — SURGICAL PATHOLOGY

## 2016-12-28 DIAGNOSIS — C7A8 Other malignant neuroendocrine tumors: Secondary | ICD-10-CM | POA: Insufficient documentation

## 2016-12-28 NOTE — Progress Notes (Signed)
[place]  [phone]) [phone] [phone]) [phone]  ID: Jason Malone OB: Apr 14, 1941  MR#: [id number]  KDT#:[id number]  Patient Care Team: Arnetha Courser, MD as PCP - General (Family Medicine) [name], Forest Gleason, MD (General Surgery) Nestor Lewandowsky, MD as Referring Physician (Cardiothoracic Surgery) Minna Merritts, MD as Consulting Physician (Cardiology) [name], MD as Consulting Physician (Cardiothoracic Surgery) Lloyd Huger, MD as Consulting Physician (Oncology)  CHIEF COMPLAINT: 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  INTERVAL HISTORY: Patient returns to clinic today for further evaluation, discussion of his imaging and pathology results, and treatment planning. He currently feels well and is asymptomatic. He has no neurologic complaints. He denies any recent fevers or illnesses. He has a good appetite and denies weight loss. He denies any chest pain, shortness of breath, hemoptysis, or cough. He denies any abdominal pain. He has no nausea, vomiting, constipation, or diarrhea. He has no urinary complaints. Patient feels at his baseline and offers no specific complaints today.  REVIEW OF SYSTEMS:   Review of Systems  Constitutional: Negative.  Negative for fever, malaise/fatigue and weight loss.  Respiratory: Negative.  Negative for cough, hemoptysis and shortness of breath.   Cardiovascular: Negative.  Negative for chest pain and leg swelling.  Gastrointestinal: Negative for abdominal pain.  Genitourinary: Negative.   Musculoskeletal: Negative.   Neurological: Negative.  Negative for weakness.  Psychiatric/Behavioral: Negative.  The patient is not nervous/anxious.     As per HPI. Otherwise, a complete review of systems is negative.  PAST MEDICAL HISTORY: Past Medical History:  Diagnosis Date  . Allergic rhinitis   . Benign prostatic hypertrophy with lower urinary tract symptoms (LUTS)   . Chest pain    a. 05/2013 MV: EF 70%, no ischemia.  . Diverticulitis    DIVERTICULOSIS  . Enlarged prostate   . Essential hypertension    CONTROLLED ON MEDS  . Full dentures    upper and lower  . H/O hypokalemia   . Hx of atrial fibrillation without current medication    CARDIOLOGIST-DR [facility]  . 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 Hyperlipemia   . Hypomagnesemia   . IFG (impaired fasting glucose)   . Liver mass, left lobe 08/28/2016   Probably hemangioma; will get MRI of liver, refer to GI  . Lung cancer ([place])    a. carcinoid, left lung, Stage 1b (T2a, N0, cM0);  b. 05/2013 s/p VATS & LULobectomy.  . Monocytosis   . Paroxysmal atrial flutter ([facility])    a. 03/2013->no recurrence;  b. CHA2DS2VASc = 2-->not currently on anticoagulation;  c. 05/2012 Echo: EF 55-60%, normal RV.  [name] Wears glasses     PAST SURGICAL HISTORY: Past Surgical History:  Procedure Laterality Date  . BRONCHOSCOPY     04/06/2013  . CARDIOVASCULAR STRESS TEST  05/2013   a. No evidence of ischemia or infarct, EF 70%, no WMAs  . CATARACT EXTRACTION W/PHACO Left 07/14/2015   Procedure: CATARACT EXTRACTION PHACO AND INTRAOCULAR LENS PLACEMENT (IOC);  Surgeon: [name], MD;  Location: [place];  Service: Ophthalmology;  Laterality: Left;  . CATARACT EXTRACTION W/PHACO Right 10/01/2015   Procedure: CATARACT EXTRACTION PHACO AND INTRAOCULAR LENS PLACEMENT (IOC);  Surgeon: [name], MD;  Location: [place];  Service: Ophthalmology;  Laterality: Right;  . COLONOSCOPY W/ POLYPECTOMY     bleed after-had to go to surgery to stop bleeding via colonoscopy  . COLONOSCOPY WITH PROPOFOL  N/A 11/19/2015   Procedure: COLONOSCOPY WITH PROPOFOL;  Surgeon: [name], MD;  Location: [facility] ENDOSCOPY;  Service: Endoscopy;  Laterality: N/A;  . DUPUYTREN CONTRACTURE RELEASE  12/15/2011   Procedure: DUPUYTREN CONTRACTURE RELEASE;  Surgeon: [name], MD;  Location: [place];  Service: Orthopedics;  Laterality: Left;  Fasciotomy left ring finger dupuytrens  . LUNG LOBECTOMY  06/10/13   upper left lung  . MULTIPLE TOOTH EXTRACTIONS    . REMOVAL RETAINED LENS Right 11/17/2015   Procedure: REMOVAL RETAINED LENS FROAGMENTS RIGHT EYE;  Surgeon: [name], MD;  Location: [place];  Service: Ophthalmology;  Laterality: Right;  . TONSILLECTOMY    . UPPER GASTROINTESTINAL ENDOSCOPY  11-03-15   Dr [name]  . VASECTOMY    . VIDEO ASSISTED THORACOSCOPY (VATS)/WEDGE RESECTION Left 06/04/2013   Procedure: VIDEO ASSISTED THORACOSCOPY (VATS)/WEDGE RESECTION;  Surgeon: [name], MD;  Location: [place];  Service: Thoracic;  Laterality: Left;  [name] VIDEO BRONCHOSCOPY N/A 06/04/2013   Procedure: VIDEO BRONCHOSCOPY;  Surgeon: [name], MD;  Location[facility] OR;  Service: Thoracic;  Laterality: N/A;    FAMILY HISTORY: Family History  Problem Relation Age of Onset  . Stroke Mother   . Alzheimer's disease Mother   . Hypertension Sister   . Hyperlipidemia Sister   . Hypertension Brother   . Hyperlipidemia Brother   . Diabetes Brother   . Cancer Sister        lung and colon    ADVANCED DIRECTIVES (Y/N):  N  HEALTH MAINTENANCE: Social History  Substance Use Topics  . Smoking status: Former Smoker    Packs/day: 1.00    Years: 40.00    Types: Cigarettes    Quit date: 12/09/1998  . Smokeless tobacco: Never Used  . Alcohol use No     Colonoscopy:  PAP:  Bone density:  Lipid panel:  Allergies  Allergen Reactions  . Latex Itching    Tape only- REACTED TO BANDAGE ON ABDOMEN  . Tape Itching    Surgical tapes     Current Outpatient Prescriptions  Medication Sig Dispense Refill  . amLODipine (NORVASC) 2.5 MG tablet Take 1 tablet (2.5 mg total) by mouth daily. 90 tablet 4  . atorvastatin (LIPITOR) 40 MG tablet TAKE 1 TABLET BY MOUTH AT BEDTIME FOR  CHOLESTEROL 90 tablet 0  . b complex vitamins capsule Take 1 capsule by mouth  daily. AM    . carvedilol (COREG) 3.125 MG tablet TAKE ONE TABLET BY MOUTH ONCE DAILY IN AM    . Cinnamon 500 MG capsule Take 500 mg by mouth 2 (two) times daily. AM AND BEDTIME    . finasteride (PROSCAR) 5 MG tablet TAKE ONE TABLET BY MOUTH ONCE DAILY 90 tablet 3  . loratadine (CLARITIN) 10 MG tablet Take 10 mg by mouth daily. AM    . losartan (COZAAR) 100 MG tablet TAKE ONE TABLET BY MOUTH ONCE DAILY 90 tablet 3  . Multiple Vitamins-Minerals (MULTIVITAMIN WITH MINERALS) tablet Take 1 tablet by mouth daily. AM    . polycarbophil (FIBERCON) 625 MG tablet Take 625 mg by mouth daily. AM    . tamsulosin (FLOMAX) 0.4 MG CAPS capsule TAKE ONE CAPSULE BY MOUTH ONCE DAILY 90 capsule 3  . vitamin C (ASCORBIC ACID) 500 MG tablet Take 500 mg by mouth daily. AM     No current facility-administered medications for this visit.     OBJECTIVE: Vitals:   12/29/16 1507  BP: (!) 149/79  Pulse: 61  Resp: 20  Temp: (!) 97.3 F (36.3 C)     Body mass index is 28.61 kg/m.    ECOG FS:0 - Asymptomatic  General: Well-developed, well-nourished, no acute distress. Eyes: Pink conjunctiva, anicteric sclera. Lungs: Clear to auscultation bilaterally. Heart: Regular rate and rhythm. No rubs, murmurs, or gallops. Abdomen: Soft, nontender, nondistended. No organomegaly noted, normoactive bowel sounds. Musculoskeletal: No edema, cyanosis, or clubbing. Neuro: Alert, answering all questions appropriately. Cranial nerves grossly intact. Skin: No rashes or petechiae noted. Psych: Normal affect.   LAB RESULTS:  Lab Results  Component Value Date   NA 143 12/17/2016   K 4.6 12/17/2016   CL 105 12/17/2016   CO2 24 12/17/2016   GLUCOSE 83 12/17/2016   BUN 17 12/17/2016   CREATININE 0.95 12/17/2016   CALCIUM 9.7 12/17/2016   PROT 6.6 12/17/2016   ALBUMIN 4.4 12/17/2016   AST 23 12/17/2016   ALT 18 12/17/2016   ALKPHOS 134 (H) 12/17/2016   BILITOT 0.5 12/17/2016   GFRNONAA 79 12/17/2016   GFRAA 91  12/17/2016    Lab Results  Component Value Date   WBC 8.4 12/17/2016   NEUTROABS 5.6 12/17/2016   HGB 14.5 12/17/2016   HCT 44.1 12/17/2016   MCV 87 12/17/2016   PLT 218 12/17/2016     STUDIES: Dg Lumbar Spine Complete  Result Date: 12/17/2016 CLINICAL DATA:  Worsening chronic low back pain with walking and standing EXAM: LUMBAR SPINE - COMPLETE 4+ VIEW COMPARISON:  None FINDINGS: Diffuse osseous demineralization. Five non-rib-bearing lumbar vertebra. Multilevel disc space narrowing and endplate spur formation greatest at L4-L5. Facet degenerative changes lower lumbar spine. No acute fracture, subluxation or bone destruction. SI joints unremarkable. Atherosclerotic calcification aorta and iliac arteries. IMPRESSION: Osseous demineralization with degenerative changes of the lumbar spine. No acute abnormalities. Electronically Signed   By: Lavonia Dana M.D.   On: 12/17/2016 18:41   Mr Liver W Wo Contrast  Result Date: 12/03/2016 CLINICAL DATA:  Followup indeterminate liver lesion. Elevated liver function tests. Personal history of lung carcinoma. EXAM: MRI ABDOMEN WITHOUT AND WITH CONTRAST TECHNIQUE: Multiplanar multisequence MR imaging of the abdomen was performed both before and after the administration of intravenous contrast. CONTRAST:  4mL MULTIHANCE GADOBENATE DIMEGLUMINE 529 MG/ML IV SOLN COMPARISON:  09/07/2016 FINDINGS: Lower chest: No acute findings. Hepatobiliary: Respiratory motion artifact noted. A well-circumscribed lesion is again seen in segment 3 of the left lobe, which shows moderate T2 hyperintensity and mild diffuse homogeneous contrast enhancement. This lesion is unchanged in appearance, but currently measures 3.3 x 2.3 cm on image 11/20, compared to 2.7 x 2.5 cm previously. This lesion shows no evidence of restricted diffusion. No other liver masses are identified. Tiny gallstone noted, without evidence of cholecystitis. No evidence of biliary duct dilatation. Pancreas:  No  mass or inflammatory changes. Spleen: Stable 1.5 cm T2 hyperintense lesion which shows enhancement which is virtually isointense with normal splenic parenchyma. This most likely represents a benign lymphangioma or hemangioma. No other splenic lesions or splenomegaly. Adrenals/Urinary Tract: No masses identified. No evidence of hydronephrosis. Stomach/Bowel: Diverticulosis involving visualized portions of left colon. No diverticulitis seen in this area. Vascular/Lymphatic: No pathologically enlarged lymph nodes identified. No abdominal aortic aneurysm. Other:  None. Musculoskeletal:  No suspicious bone lesions identified. IMPRESSION: Nonspecific 3.3 cm mass in left hepatic lobe measures slightly larger in size compared to previous study. Consider percutaneous needle biopsy or continued followup by MRI in 3 months. Cholelithiasis. No evidence of cholecystitis or biliary ductal  dilatation. Stable small splenic lesion, likely representing a benign lymphangioma or hemangioma. Continued attention recommended on follow-up imaging. Electronically Signed   By: Earle Gell M.D.   On: 12/03/2016 14:17   US Biopsy  Result Date: 12/20/2016 INDICATION: 76 year old male with history of left upper lobe malignant lung carcinoid. He now has an enlarging but indeterminate mass in hepatic segment 3. Differential considerations include atypical hemangioma, and metastasis. EXAM: Ultrasound-guided core biopsy MEDICATIONS: None. ANESTHESIA/SEDATION: Moderate (conscious) sedation was employed during this procedure. A total of Versed 2 mg and Fentanyl 100 mcg was administered intravenously. Moderate Sedation Time: 20 minutes. The patient's level of consciousness and vital signs were monitored continuously by radiology nursing throughout the procedure under my direct supervision. FLUOROSCOPY TIME:  Fluoroscopy Time: 0 minutes 0 seconds (0 mGy). COMPLICATIONS: None immediate. PROCEDURE: Informed written consent was obtained from the  patient after a thorough discussion of the procedural risks, benefits and alternatives. All questions were addressed. A timeout was performed prior to the initiation of the procedure. The right upper quadrant was interrogated with ultrasound. The segment 3 mass is successfully identified and measures approximately 3.4 x 3.3 x 3.6 cm. The mass is slightly echogenic and relatively well-defined. A suitable skin entry site was selected and marked. The skin was then sterilely prepped and draped in the standard fashion using chlorhexidine skin prep. Local anesthesia was attained by infiltration with 1% lidocaine. A small dermatotomy was made. Under real-time sonographic guidance, a 17 gauge introducer needle was advanced through the left hepatic lobe and into the margin the mass. 18 gauge core biopsies were then coaxially obtained using a BioPince automated biopsy device. Specimens were deposited in formalin and delivered to pathology for further analysis. As the introducer needle was removed, the biopsy tract was embolized with a Gel-Foam slurry. The patient tolerated the procedure well. There was no immediate complication. IMPRESSION: Technically successful ultrasound-guided core lesion of segment 3 hepatic lesion. Signed, Criselda Peaches, MD [facility] Electronically Signed   By: Jacqulynn Cadet M.D.   On: 12/20/2016 12:13    ASSESSMENT: 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  PLAN:    1. 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 Patient underwent left upper lobe lobectomy in [place] on June 04, 2013. Final pathology results revealed a well differentiated neuroendocrine tumor with positive bronchial margins. Patient did not require adjuvant XRT or chemotherapy at that time. Recent imaging and biopsy completed  on December 20, 2016 revealed a recurrence of patient's known neuroendocrine disease in his liver. Patient's chromogranin A levels are also mildly elevated at 7.  Now that he has documented metastatic disease, will proceed with an 120 mg subcutaneous Lanreotide every 4 weeks. Patient will return to clinic in 5 weeks to receive cycle 2 of treatment and further evaluation. Plan to repeat imaging with MRI in approximately 6 months.   Approximately 30 minutes was spent in discussion of which greater than 50% was consultation.  Patient expressed understanding and was in agreement with this plan. He also understands that He can call clinic at any time with any questions, concerns, or complaints.   Cancer Staging Neuroendocrine carcinoma of lung [facility]) Staging form: Lung, AJCC 8th Edition - Clinical stage from 12/28/2016: Stage IVA (cT2a, cN0, cM1b) - Signed by Lloyd Huger, MD on 12/28/2016   Lloyd Huger, MD   12/31/2016 3:27 PM

## 2016-12-29 ENCOUNTER — Inpatient Hospital Stay: Payer: Medicare Other | Attending: Oncology | Admitting: Oncology

## 2016-12-29 ENCOUNTER — Inpatient Hospital Stay: Payer: Medicare Other

## 2016-12-29 VITALS — BP 149/79 | HR 61 | Temp 97.3°F | Resp 20 | Wt 161.5 lb

## 2016-12-29 DIAGNOSIS — C787 Secondary malignant neoplasm of liver and intrahepatic bile duct: Secondary | ICD-10-CM | POA: Insufficient documentation

## 2016-12-29 DIAGNOSIS — Z79899 Other long term (current) drug therapy: Secondary | ICD-10-CM | POA: Insufficient documentation

## 2016-12-29 DIAGNOSIS — E785 Hyperlipidemia, unspecified: Secondary | ICD-10-CM | POA: Insufficient documentation

## 2016-12-29 DIAGNOSIS — I4892 Unspecified atrial flutter: Secondary | ICD-10-CM | POA: Diagnosis not present

## 2016-12-29 DIAGNOSIS — N400 Enlarged prostate without lower urinary tract symptoms: Secondary | ICD-10-CM | POA: Diagnosis not present

## 2016-12-29 DIAGNOSIS — K8020 Calculus of gallbladder without cholecystitis without obstruction: Secondary | ICD-10-CM | POA: Insufficient documentation

## 2016-12-29 DIAGNOSIS — I4891 Unspecified atrial fibrillation: Secondary | ICD-10-CM | POA: Diagnosis not present

## 2016-12-29 DIAGNOSIS — I10 Essential (primary) hypertension: Secondary | ICD-10-CM | POA: Insufficient documentation

## 2016-12-29 DIAGNOSIS — K5730 Diverticulosis of large intestine without perforation or abscess without bleeding: Secondary | ICD-10-CM | POA: Diagnosis not present

## 2016-12-29 DIAGNOSIS — Z87891 Personal history of nicotine dependence: Secondary | ICD-10-CM | POA: Diagnosis not present

## 2016-12-29 DIAGNOSIS — C7A8 Other malignant neuroendocrine tumors: Secondary | ICD-10-CM | POA: Diagnosis not present

## 2016-12-29 NOTE — Progress Notes (Signed)
Patient is here for follow up, he does have questions for the doctor today.

## 2016-12-31 LAB — CHROMOGRANIN A: CHROMOGRANIN A: 7 nmol/L — AB (ref 0–5)

## 2017-01-05 ENCOUNTER — Inpatient Hospital Stay: Payer: Medicare Other

## 2017-01-05 DIAGNOSIS — I10 Essential (primary) hypertension: Secondary | ICD-10-CM | POA: Diagnosis not present

## 2017-01-05 DIAGNOSIS — C787 Secondary malignant neoplasm of liver and intrahepatic bile duct: Secondary | ICD-10-CM | POA: Diagnosis not present

## 2017-01-05 DIAGNOSIS — I4892 Unspecified atrial flutter: Secondary | ICD-10-CM | POA: Diagnosis not present

## 2017-01-05 DIAGNOSIS — Z87891 Personal history of nicotine dependence: Secondary | ICD-10-CM | POA: Diagnosis not present

## 2017-01-05 DIAGNOSIS — C7A8 Other malignant neuroendocrine tumors: Secondary | ICD-10-CM | POA: Diagnosis not present

## 2017-01-05 DIAGNOSIS — N400 Enlarged prostate without lower urinary tract symptoms: Secondary | ICD-10-CM | POA: Diagnosis not present

## 2017-01-05 DIAGNOSIS — Z79899 Other long term (current) drug therapy: Secondary | ICD-10-CM | POA: Diagnosis not present

## 2017-01-05 DIAGNOSIS — K8020 Calculus of gallbladder without cholecystitis without obstruction: Secondary | ICD-10-CM | POA: Diagnosis not present

## 2017-01-05 DIAGNOSIS — I4891 Unspecified atrial fibrillation: Secondary | ICD-10-CM | POA: Diagnosis not present

## 2017-01-05 DIAGNOSIS — K5730 Diverticulosis of large intestine without perforation or abscess without bleeding: Secondary | ICD-10-CM | POA: Diagnosis not present

## 2017-01-05 DIAGNOSIS — E785 Hyperlipidemia, unspecified: Secondary | ICD-10-CM | POA: Diagnosis not present

## 2017-01-05 MED ORDER — LANREOTIDE ACETATE 120 MG/0.5ML ~~LOC~~ SOLN
120.0000 mg | Freq: Once | SUBCUTANEOUS | Status: AC
  Administered 2017-01-05: 120 mg via SUBCUTANEOUS
  Filled 2017-01-05: qty 120

## 2017-01-07 ENCOUNTER — Telehealth: Payer: Self-pay | Admitting: Family Medicine

## 2017-01-07 DIAGNOSIS — M8580 Other specified disorders of bone density and structure, unspecified site: Secondary | ICD-10-CM | POA: Insufficient documentation

## 2017-01-07 MED ORDER — HYDROCORTISONE ACETATE 25 MG RE SUPP: 25.0000 mg | Freq: Two times a day (BID) | RECTAL | 0 refills | Status: DC

## 2017-01-07 NOTE — Telephone Encounter (Signed)
He had demineralization of bones on lumbar spine films Will get DEXA scan; ordered Please let him know he can schedule that at his convenience Thank you

## 2017-01-07 NOTE — Assessment & Plan Note (Signed)
Demineralization seen on Xray; order DEXA

## 2017-01-07 NOTE — Telephone Encounter (Signed)
I spoke with patient about recent diagnosis, cancer met to liver; started treatment He is doing okay; I am here for him if anything I can do Has hemorrhoids; offered Rx; confirmed pharmacy

## 2017-01-11 NOTE — Telephone Encounter (Signed)
Patient notified

## 2017-01-12 ENCOUNTER — Encounter: Payer: Self-pay | Admitting: Family Medicine

## 2017-01-12 ENCOUNTER — Ambulatory Visit (INDEPENDENT_AMBULATORY_CARE_PROVIDER_SITE_OTHER): Payer: Medicare Other | Admitting: Family Medicine

## 2017-01-12 VITALS — BP 126/74 | HR 60 | Temp 98.0°F | Resp 16 | Wt 157.5 lb

## 2017-01-12 DIAGNOSIS — K649 Unspecified hemorrhoids: Secondary | ICD-10-CM | POA: Diagnosis not present

## 2017-01-12 DIAGNOSIS — R160 Hepatomegaly, not elsewhere classified: Secondary | ICD-10-CM | POA: Diagnosis not present

## 2017-01-12 DIAGNOSIS — K625 Hemorrhage of anus and rectum: Secondary | ICD-10-CM

## 2017-01-12 NOTE — Patient Instructions (Addendum)
Appointment with Dr. Fleet Contras at 11:30am (please arrive at 11:15am) tomorrow, Thursday 01/13/2017.

## 2017-01-12 NOTE — Progress Notes (Addendum)
Name: Jason Malone   MRN: [id number]    DOB: 1941-02-07   Date:01/12/2017       Progress Note  Subjective  Chief Complaint  Chief Complaint  Patient presents with  . Hemorrhoids    wants referral to [facility]    HPI  Patient presents with complaint of ongoing hemorroids.  He has recently diagnosed with Mets to the liver, and the medication is causing him to have looser BM's.  He is having 3-4 loose, brown BM's a day and has been seeing bright red blood dripping from the rectum afterwards; sometimes has to wear a pad throughout the day - says he has never soaked a pad - usually just has spots of blood on it. - Suppository is helping the pain but doesn't stop it completely.  Pain at the worst is 6-7/10 burning, at it's best pain is 0/10. - Next Appointment with Dr. Grayland Ormond (Oncology) is 02/02/2017 - Has seen Dr. [name] in the past for hemorrhoid repair and requests referral back to him if possible.  Patient Active Problem List   Diagnosis Date Noted  . Osteopenia determined by x-ray 01/07/2017  . 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 Low back pain 12/17/2016  . Liver mass, left lobe 08/28/2016  . 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 History of lung cancer 06/29/2016  . Aortic atherosclerosis ([place]) 04/06/2016  . Coronary artery disease involving native heart without angina pectoris 04/06/2016  . History of colonic polyps 10/28/2015  . Medication monitoring encounter 08/15/2015  . Paroxysmal atrial flutter ([place])   . Hyperlipemia   . Elevated serum alkaline phosphatase level 03/05/2015  . Allergic rhinitis   . IFG (impaired fasting glucose)   . BPH without urinary obstruction   . Hyperlipidemia 02/13/2014  . Sinus bradycardia 07/18/2013  . S/P lobectomy of lung 06/04/2013  . Atrial flutter ([place]) 04/09/2013  . Smoking history 04/09/2013  . Essential hypertension 04/09/2013  . Rectal bleeding 12/15/2012    Social History   Substance Use Topics  . Smoking status: Former Smoker    Packs/day: 1.00    Years: 40.00    Types: Cigarettes    Quit date: 12/09/1998  . Smokeless tobacco: Never Used  . Alcohol use No     Current Outpatient Prescriptions:  .  amLODipine (NORVASC) 2.5 MG tablet, Take 1 tablet (2.5 mg total) by mouth daily., Disp: 90 tablet, Rfl: 4 .  atorvastatin (LIPITOR) 40 MG tablet, TAKE 1 TABLET BY MOUTH AT BEDTIME FOR  CHOLESTEROL, Disp: 90 tablet, Rfl: 0 .  b complex vitamins capsule, Take 1 capsule by mouth daily. AM, Disp: , Rfl:  .  carvedilol (COREG) 3.125 MG tablet, TAKE ONE TABLET BY MOUTH ONCE DAILY IN AM, Disp: , Rfl:  .  Cinnamon 500 MG capsule, Take 500 mg by mouth 2 (two) times daily. AM AND BEDTIME, Disp: , Rfl:  .  finasteride (PROSCAR) 5 MG tablet, TAKE ONE TABLET BY MOUTH ONCE DAILY, Disp: 90 tablet, Rfl: 3 .  hydrocortisone (ANUSOL-HC) 25 MG suppository, Place 1 suppository (25 mg total) rectally 2 (two) times daily., Disp: 24 suppository, Rfl: 0 .  loratadine (CLARITIN) 10 MG tablet, Take 10 mg by mouth daily. AM, Disp: , Rfl:  .  losartan (COZAAR) 100 MG tablet, TAKE ONE TABLET BY MOUTH ONCE DAILY, Disp: 90 tablet, Rfl: 3 .  Multiple Vitamins-Minerals (MULTIVITAMIN WITH MINERALS) tablet, Take 1 tablet by mouth daily. AM, Disp: , Rfl:  .  polycarbophil (FIBERCON) 625 MG tablet, Take 625 mg by mouth daily. AM, Disp: , Rfl:  .  tamsulosin (FLOMAX) 0.4 MG CAPS capsule, TAKE ONE CAPSULE BY MOUTH ONCE DAILY, Disp: 90 capsule, Rfl: 3 .  vitamin C (ASCORBIC ACID) 500 MG tablet, Take 500 mg by mouth daily. AM, Disp: , Rfl:   Allergies  Allergen Reactions  . Latex Itching    Tape only- REACTED TO BANDAGE ON ABDOMEN  . Tape Itching    Surgical tapes     ROS  Constitutional: Negative for fever or weight change.  Respiratory: Negative for cough and shortness of breath.   Cardiovascular: Negative for chest pain or palpitations.  Gastrointestinal: Negative for abdominal pain, no  bowel changes. Denies nausea or vomiting. See HPI Skin: Negative for rash.  Neurological: Negative for dizziness or headache.  No other specific complaints in a complete review of systems (except as listed in HPI above).  Objective  Vitals:   01/12/17 1029  BP: 126/74  Pulse: 60  Resp: 16  Temp: 98 F (36.7 C)  TempSrc: Oral  SpO2: 94%  Weight: 157 lb 8 oz (71.4 kg)   Body mass index is 27.9 kg/m.  Nursing Note and Vital Signs reviewed.  Physical Exam  Constitutional: Patient appears well-developed and well-nourished.  No distress.  HEENT: head atraumatic, normocephalic Cardiovascular: Normal rate, regular rhythm, S1/S2 present.  No murmur or rub heard. No BLE edema. Pulmonary/Chest: Effort normal and breath sounds clear. No respiratory distress or retractions. Abdominal: Soft and non-tender, bowel sounds present x4 quadrants. RECTAL: 2 non-thrombosed external hemorrhoids present, no additional hemorrhoids protrude when patient bears down.  Internal rectal exam - internal hemorrhoid is palpated and quite tender.  No frank blood present. Musculoskeletal: Normal range of motion, no joint effusions. No gross deformities Neurological: he is alert and oriented to person, place, and time. No cranial nerve deficit. Coordination, balance, strength, speech and gait are normal.  Skin: Skin is warm and dry. No rash noted. No erythema.  Psychiatric: Patient has a normal mood and affect. behavior is normal. Judgment and thought content normal.  Recent Results (from the past 2160 hour(s))  Basic metabolic panel     Status: Abnormal   Collection Time: 12/03/16 10:26 AM  Result Value Ref Range   Sodium 140 135 - 145 mmol/L   Potassium 4.3 3.5 - 5.1 mmol/L   Chloride 106 101 - 111 mmol/L   CO2 27 22 - 32 mmol/L   Glucose, Bld 121 (H) 65 - 99 mg/dL   BUN 18 6 - 20 mg/dL   Creatinine, Ser 1.03 0.61 - 1.24 mg/dL   Calcium 9.6 8.9 - 10.3 mg/dL   GFR calc non Af Amer >60 >60 mL/min   GFR  calc Af Amer >60 >60 mL/min    Comment: (NOTE) The eGFR has been calculated using the CKD EPI equation. This calculation has not been validated in all clinical situations. eGFR's persistently <60 mL/min signify possible Chronic Kidney Disease.    Anion gap 7 5 - 15  Comprehensive metabolic panel     Status: Abnormal   Collection Time: 12/17/16  2:23 PM  Result Value Ref Range   Glucose 83 65 - 99 mg/dL   BUN 17 8 - 27 mg/dL   Creatinine, Ser 0.95 0.76 - 1.27 mg/dL   GFR calc non Af Amer 79 >59 mL/min/1.73   GFR calc Af Amer 91 >59 mL/min/1.73   BUN/Creatinine Ratio 18 10 - 24   Sodium 143 134 -  144 mmol/L   Potassium 4.6 3.5 - 5.2 mmol/L   Chloride 105 96 - 106 mmol/L   CO2 24 20 - 29 mmol/L   Calcium 9.7 8.6 - 10.2 mg/dL   Total Protein 6.6 6.0 - 8.5 g/dL   Albumin 4.4 3.5 - 4.8 g/dL   Globulin, Total 2.2 1.5 - 4.5 g/dL   Albumin/Globulin Ratio 2.0 1.2 - 2.2   Bilirubin Total 0.5 0.0 - 1.2 mg/dL   Alkaline Phosphatase 134 (H) 39 - 117 IU/L   AST 23 0 - 40 IU/L   ALT 18 0 - 44 IU/L  CBC with Differential/Platelet     Status: Abnormal   Collection Time: 12/17/16  2:23 PM  Result Value Ref Range   WBC 8.4 3.4 - 10.8 x10E3/uL   RBC 5.05 4.14 - 5.80 x10E6/uL   Hemoglobin 14.5 13.0 - 17.7 g/dL   Hematocrit 44.1 37.5 - 51.0 %   MCV 87 79 - 97 fL   MCH 28.7 26.6 - 33.0 pg   MCHC 32.9 31.5 - 35.7 g/dL   RDW 14.1 12.3 - 15.4 %   Platelets 218 150 - 379 x10E3/uL   Neutrophils 66 Not Estab. %   Lymphs 13 Not Estab. %   Monocytes 17 Not Estab. %   Eos 3 Not Estab. %   Basos 1 Not Estab. %   Neutrophils Absolute 5.6 1.4 - 7.0 x10E3/uL   Lymphocytes Absolute 1.1 0.7 - 3.1 x10E3/uL   Monocytes Absolute 1.5 (H) 0.1 - 0.9 x10E3/uL   EOS (ABSOLUTE) 0.2 0.0 - 0.4 x10E3/uL   Basophils Absolute 0.1 0.0 - 0.2 x10E3/uL   Immature Granulocytes 0 Not Estab. %   Immature Grans (Abs) 0.0 0.0 - 0.1 x10E3/uL  INR/PT     Status: None   Collection Time: 12/17/16  2:23 PM  Result Value Ref  Range   INR 1.0 0.8 - 1.2    Comment: Reference interval is for non-anticoagulated patients. Suggested INR therapeutic range for Vitamin K antagonist therapy:    Standard Dose (moderate intensity                   therapeutic range):       2.0 - 3.0    Higher intensity therapeutic range       2.5 - 3.5    Prothrombin Time 10.4 9.1 - 12.0 sec  PTT     Status: None   Collection Time: 12/17/16  2:23 PM  Result Value Ref Range   aPTT 30 24 - 33 sec    Comment: This test has not been validated for monitoring unfractionated heparin therapy. aPTT-based therapeutic ranges for unfractionated heparin therapy have not been established. For general guidelines on Heparin monitoring, refer to the [employer].   Urinalysis, Routine w reflex microscopic     Status: None   Collection Time: 12/17/16  2:23 PM  Result Value Ref Range   Specific Gravity, UA 1.025 1.005 - 1.030   pH, UA 5.0 5.0 - 7.5   Color, UA Yellow Yellow   Appearance Ur Clear Clear   Leukocytes, UA Negative Negative   Protein, UA Negative Negative/Trace   Glucose, UA Negative Negative   Ketones, UA Negative Negative   RBC, UA Negative Negative   Bilirubin, UA Negative Negative   Urobilinogen, Ur 0.2 0.2 - 1.0 mg/dL   Nitrite, UA Negative Negative   Microscopic Examination Comment     Comment: Microscopic not indicated and not performed.  Alkaline Phosphatase,  Isoenzymes     Status: None   Collection Time: 12/17/16  2:23 PM  Result Value Ref Range   LIVER FRACTION 82 13 - 88 %   BONE FRACTION 14 12 - 68 %   INTESTINAL FRAC. 4 0 - 18 %  Surgical pathology     Status: None   Collection Time: 12/20/16 11:49 AM  Result Value Ref Range   SURGICAL PATHOLOGY      Surgical Pathology CASE: [id number] PATIENT: Finian Liese Surgical Pathology Report     SPECIMEN SUBMITTED: A. Liver lesion, segment 3  CLINICAL HISTORY: History of LUL well differentiated neuroendocrine tumor of lung (pT2aN0) in  2014  PRE-OPERATIVE DIAGNOSIS: Enlarging but non specific liver mass.  POST-OPERATIVE DIAGNOSIS: Same as pre-op     DIAGNOSIS: A. LIVER LESION, SEGMENTS 3; ULTRASOUND GUIDED BIOPSY: - METASTATIC ATYPICAL CARCINOID.  Comment: There are 3 mitosis per 2 cubic mm. Single cell tumor necrosis is present. CD56 stain is positive. TTF-1 stain is negative (per prior pathology report the lung carcinoid was negative for TTF-1). Stain controls worked appropriately.   GROSS DESCRIPTION:  A. Labeled: liver mass biopsy  Tissue fragment(s): 3 cores  Size: 0.4- 1.5 cm in length and in diameter 0.1 cm  Description: tan-red cores, wrapped in lens paper marked orange and submitted in a mesh bag  Entirely submitted in 1-2 cassette(s).     Final Diagnosis performed by Quay Burow, MD.  Electronically signed 12/23/2016 2:31:47PM    The electronic signature indicates that the named Attending Pathologist has evaluated the specimen  Technical component performed at [facility], [address], [place], [place] [address] Lab: [phone] Dir: Darrick Penna. Evette Doffing, MD  Professional component performed at [facility], [place], [place], [place] [address] Lab: [phone] Dir: Dellia Nims. Rubinas, MD    Chromogranin A     Status: Abnormal   Collection Time: 12/29/16  3:52 PM  Result Value Ref Range   Chromogranin A 7 (H) 0 - 5 nmol/L    Comment: (NOTE) Chromogranin A performed by [employer]. Results for this test are designated to be for research purposes only by the assay's manufacturer. The performance characteristics of this product have not been established. Results for this test should not be used as absolute evidence of presence or absence of malignant disease without confirmation of the diagnosis by another medically established diagnostic product or procedure. Values obtained with different assay methods or kits cannot be  used interchangeably. Performed At: [facility] [place], [place] [id number] Lindon Romp MD WF:[id number]      Assessment & Plan  1. Rectal bleeding - Ambulatory referral to General Surgery  2. Hemorrhoids, unspecified hemorrhoid type - Ambulatory referral to General Surgery  - Referral to Dr. Fleet Contras is placed. Appointment with Dr. Fleet Contras at 11:30am (please arrive at 11:15am) tomorrow, Thursday 01/13/2017.  3. Liver mass, left lobe Urgent Referral due to rectal bleeding and increased risk of rectal cancer since the patient has liver mets.  I have reviewed this encounter including the documentation in this note and/or discussed this patient with the Johney Maine, FNP, NP-C. I am certifying that I agree with the content of this note as supervising physician.  Steele Sizer, MD [facility] 01/18/2017, 1:32 PM

## 2017-01-13 ENCOUNTER — Encounter: Payer: Self-pay | Admitting: General Surgery

## 2017-01-13 ENCOUNTER — Ambulatory Visit (INDEPENDENT_AMBULATORY_CARE_PROVIDER_SITE_OTHER): Payer: Medicare Other | Admitting: General Surgery

## 2017-01-13 VITALS — BP 130/74 | HR 62 | Resp 12 | Ht 63.0 in | Wt 157.0 lb

## 2017-01-13 DIAGNOSIS — R58 Hemorrhage, not elsewhere classified: Secondary | ICD-10-CM

## 2017-01-13 DIAGNOSIS — K648 Other hemorrhoids: Secondary | ICD-10-CM | POA: Diagnosis not present

## 2017-01-13 NOTE — Progress Notes (Signed)
Patient ID: Jason Malone, male   DOB: 1941-03-03, 76 y.o.   MRN: [id number]  Chief Complaint  Patient presents with  . Rectal Problems    hemorrhoids    HPI Lando W Plitt is a 76 y.o. male.  here today for evaluation of hemorrhoids. He received a shot for liver cancer and it caused loose stools which triggered the hemorrhoid pain. He is due to get get this shot every 28 days ( 02-02-17). He did not get the hemorrhoid suppository due to $181. He did however get some OTC hemorrhoid suppository that has helped the itching and pain. He did bleed some yesterday but not as much.  He states that the hemorrhoids became an issue with constipation taking pain medications post lung surgery 4 years ago. His bowels typically move daily. He states he notices some bleeding on the tissue when he wipes and he does have leakage of stool for the past couple of weeks. He was wondering if surgery is and option. His colonoscopy was 11-19-15.  Her is here with Stevphen Meuse, his wife.   HPI  Past Medical History:  Diagnosis Date  . Allergic rhinitis   . Benign prostatic hypertrophy with lower urinary tract symptoms (LUTS)   . Chest pain    a. 05/2013 MV: EF 70%, no ischemia.  . Diverticulitis    DIVERTICULOSIS  . Enlarged prostate   . Essential hypertension    CONTROLLED ON MEDS  . Full dentures    upper and lower  . H/O hypokalemia   . Hx of atrial fibrillation without current medication    CARDIOLOGIST-DR [facility]  . 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 Hyperlipemia   . Hypomagnesemia   . IFG (impaired fasting glucose)   . Liver cancer ([place])   . Liver mass, left lobe 08/28/2016   Probably hemangioma; will get MRI of liver, refer to GI  . Lung cancer ([place])    a. carcinoid, left lung, Stage 1b (T2a, N0, cM0);  b. 05/2013 s/p VATS & LULobectomy.  . Monocytosis   . Paroxysmal atrial flutter ([facility])    a. 03/2013->no recurrence;  b. CHA2DS2VASc = 2-->not currently on  anticoagulation;  c. 05/2012 Echo: EF 55-60%, normal RV.  [name] Wears glasses     Past Surgical History:  Procedure Laterality Date  . BRONCHOSCOPY     04/06/2013  . CARDIOVASCULAR STRESS TEST  05/2013   a. No evidence of ischemia or infarct, EF 70%, no WMAs  . CATARACT EXTRACTION W/PHACO Left 07/14/2015   Procedure: CATARACT EXTRACTION PHACO AND INTRAOCULAR LENS PLACEMENT (IOC);  Surgeon: [name], MD;  Location: [facility];  Service: Ophthalmology;  Laterality: Left;  . CATARACT EXTRACTION W/PHACO Right 10/01/2015   Procedure: CATARACT EXTRACTION PHACO AND INTRAOCULAR LENS PLACEMENT (IOC);  Surgeon: [name], MD;  Location: [place];  Service: Ophthalmology;  Laterality: Right;  . COLONOSCOPY W/ POLYPECTOMY     bleed after-had to go to surgery to stop bleeding via colonoscopy  . COLONOSCOPY WITH PROPOFOL N/A 11/19/2015   Procedure: COLONOSCOPY WITH PROPOFOL;  Surgeon: [name], MD;  Location: [facility] ENDOSCOPY;  Service: Endoscopy;  Laterality: N/A;  . DUPUYTREN CONTRACTURE RELEASE  12/15/2011   Procedure: DUPUYTREN CONTRACTURE RELEASE;  Surgeon: [name], MD;  Location: [place] [facility];  Service: Orthopedics;  Laterality: Left;  Fasciotomy left ring finger dupuytrens  . LUNG LOBECTOMY  06/10/13   upper left lung  . MULTIPLE TOOTH EXTRACTIONS    .  REMOVAL RETAINED LENS Right 11/17/2015   Procedure: REMOVAL RETAINED LENS FROAGMENTS RIGHT EYE;  Surgeon: [name], MD;  Location: [place];  Service: Ophthalmology;  Laterality: Right;  . TONSILLECTOMY    . UPPER GASTROINTESTINAL ENDOSCOPY  11-03-15   Dr [name]  . VASECTOMY    . VIDEO ASSISTED THORACOSCOPY (VATS)/WEDGE RESECTION Left 06/04/2013   Procedure: VIDEO ASSISTED THORACOSCOPY (VATS)/WEDGE RESECTION;  Surgeon: [name], MD;  Location: [facility];  Service: Thoracic;  Laterality: Left;  [name] VIDEO BRONCHOSCOPY N/A 06/04/2013   Procedure: VIDEO BRONCHOSCOPY;   Surgeon: [name], MD;  [facility] OR;  Service: Thoracic;  Laterality: N/A;    Family History  Problem Relation Age of Onset  . Stroke Mother   . Alzheimer's disease Mother   . Hypertension Sister   . Hyperlipidemia Sister   . Hypertension Brother   . Hyperlipidemia Brother   . Diabetes Brother   . Cancer Sister        lung and colon    Social History Social History  Substance Use Topics  . Smoking status: Former Smoker    Packs/day: 1.00    Years: 40.00    Types: Cigarettes    Quit date: 12/09/1998  . Smokeless tobacco: Never Used  . Alcohol use No    Allergies  Allergen Reactions  . Latex Itching    Tape only- REACTED TO BANDAGE ON ABDOMEN  . Tape Itching    Surgical tapes     Current Outpatient Prescriptions  Medication Sig Dispense Refill  . amLODipine (NORVASC) 2.5 MG tablet Take 1 tablet (2.5 mg total) by mouth daily. 90 tablet 4  . b complex vitamins capsule Take 1 capsule by mouth daily. AM    . carvedilol (COREG) 3.125 MG tablet TAKE ONE TABLET BY MOUTH ONCE DAILY IN AM    . Cinnamon 500 MG capsule Take 500 mg by mouth 2 (two) times daily. AM AND BEDTIME    . finasteride (PROSCAR) 5 MG tablet TAKE ONE TABLET BY MOUTH ONCE DAILY 90 tablet 3  . loratadine (CLARITIN) 10 MG tablet Take 10 mg by mouth daily. AM    . losartan (COZAAR) 100 MG tablet TAKE ONE TABLET BY MOUTH ONCE DAILY 90 tablet 3  . Multiple Vitamins-Minerals (MULTIVITAMIN WITH MINERALS) tablet Take 1 tablet by mouth daily. AM    . polycarbophil (FIBERCON) 625 MG tablet Take 625 mg by mouth daily. AM    . tamsulosin (FLOMAX) 0.4 MG CAPS capsule TAKE ONE CAPSULE BY MOUTH ONCE DAILY 90 capsule 3  . vitamin C (ASCORBIC ACID) 500 MG tablet Take 500 mg by mouth daily. AM    . atorvastatin (LIPITOR) 40 MG tablet TAKE 1 TABLET BY MOUTH AT BEDTIME FOR  CHOLESTEROL 30 tablet 0  . hydrocortisone (ANUSOL-HC) 25 MG suppository Place 1 suppository (25 mg total) rectally 2 (two) times daily. (Patient  not taking: Reported on 01/13/2017) 24 suppository 0   No current facility-administered medications for this visit.     Review of Systems Review of Systems  Constitutional: Negative.   Respiratory: Negative.   Cardiovascular: Negative.   Gastrointestinal: Positive for anal bleeding. Negative for constipation and diarrhea.    Blood pressure 130/74, pulse 62, resp. rate 12, height 5\' 3"  (1.6 m), weight 157 lb (71.2 kg).  Physical Exam Physical Exam  Constitutional: He is oriented to person, place, and time. He appears well-developed and well-nourished.  Cardiovascular: Normal rate, regular rhythm and normal heart sounds.   Pulmonary/Chest: Effort  normal and breath sounds normal.  Genitourinary: Rectal exam shows internal hemorrhoid.  Neurological: He is alert and oriented to person, place, and time.  Skin: Skin is warm and dry.    Data Reviewed 11/19/2015 identified a single 5 mm tubular adenoma.  Anoscopy shows prominent, but presently nonbleeding hemorrhoids approximately a centimeter in diameter.  Assessment    Long history intermittent rectal bleeding and prolapsing hemorrhoid. Recent exacerbation with institution of Lanreotide for management of his metastatic liver disease from the original neuroendocrine lung tumor.    Plan    Cost of Anusol HC suppositories has been prohibitive. He's noted improvement over the last several days as his stools have begun to return to normal now 8 days after his first injection. We'll give him another week or 2 to sell down and plan for hemorrhoid banding prior to his next injection of Lanreotide with the hopes of preventing further episodes of bleeding and prolapse.  Risks associated with hemorrhoid banding including bleeding and infection were reviewed.   Patient to return in two weeks hemorrhoid banding . [name] Use a over the counter suppository   . HPI, Physical Exam, Assessment and Plan have been scribed under the direction and in the  presence of Hervey Ard, MD.  Gaspar Cola, CMA  I have completed the exam and reviewed the above documentation for accuracy and completeness.  I agree with the above.  Haematologist has been used and any errors in dictation or transcription are unintentional.  Hervey Ard, M.D., F.A.C.S.  [name] 01/14/2017, 12:55 PM

## 2017-01-14 ENCOUNTER — Other Ambulatory Visit: Payer: Self-pay | Admitting: Family Medicine

## 2017-01-14 DIAGNOSIS — E782 Mixed hyperlipidemia: Secondary | ICD-10-CM

## 2017-01-14 DIAGNOSIS — K648 Other hemorrhoids: Secondary | ICD-10-CM | POA: Insufficient documentation

## 2017-01-14 MED ORDER — ATORVASTATIN CALCIUM 40 MG PO TABS: ORAL_TABLET | ORAL | 0 refills | Status: DC

## 2017-01-14 NOTE — Telephone Encounter (Signed)
Lipid panel from April 2018 reviewed. Pt has appointment with Dr. Sanda Klein 02/14/2017, 30 day supply provided.

## 2017-01-17 NOTE — Addendum Note (Signed)
Addended by: Hubbard Hartshorn on: 01/17/2017 07:56 AM   Modules accepted: Level of Service

## 2017-01-24 ENCOUNTER — Ambulatory Visit (INDEPENDENT_AMBULATORY_CARE_PROVIDER_SITE_OTHER): Payer: Medicare Other | Admitting: General Surgery

## 2017-01-24 ENCOUNTER — Encounter: Payer: Self-pay | Admitting: General Surgery

## 2017-01-24 VITALS — BP 130/70 | HR 68 | Resp 12 | Ht 64.0 in | Wt 160.0 lb

## 2017-01-24 DIAGNOSIS — K648 Other hemorrhoids: Secondary | ICD-10-CM

## 2017-01-24 NOTE — Progress Notes (Signed)
Patient ID: Jason Malone, male   DOB: [birth year], 76 y.o.   MRN: [id number]  Chief Complaint  Patient presents with  . Procedure    HPI Donato W Maertens is a 76 y.o. male here today for a hemorrhoid banding and anoscopy. The patient reports less bleeding since making use of suppositories since his last visit.  The patient reports constipation since beginning 120 mg subcutaneous Lanreotide every 4 weeks.  He has been using stool softeners and fiber supplements. HPI  Past Medical History:  Diagnosis Date  . Allergic rhinitis   . Benign prostatic hypertrophy with lower urinary tract symptoms (LUTS)   . Chest pain    a. 05/2013 MV: EF 70%, no ischemia.  . Diverticulitis    DIVERTICULOSIS  . Enlarged prostate   . Essential hypertension    CONTROLLED ON MEDS  . Full dentures    upper and lower  . H/O hypokalemia   . Hx of atrial fibrillation without current medication    CARDIOLOGIST-DR [facility]  . 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 Hyperlipemia   . Hypomagnesemia   . IFG (impaired fasting glucose)   . Liver cancer ([place])   . Liver mass, left lobe 08/28/2016   Probably hemangioma; will get MRI of liver, refer to GI  . Lung cancer ([place])    a. carcinoid, left lung, Stage 1b (T2a, N0, cM0);  b. 05/2013 s/p VATS & LULobectomy.  . Monocytosis   . Paroxysmal atrial flutter (H[facility])    a. 03/2013->no recurrence;  b. CHA2DS2VASc = 2-->not currently on anticoagulation;  c. 05/2012 Echo: EF 55-60%, normal RV.  [name] Wears glasses     Past Surgical History:  Procedure Laterality Date  . BRONCHOSCOPY     04/06/2013  . CARDIOVASCULAR STRESS TEST  05/2013   a. No evidence of ischemia or infarct, EF 70%, no WMAs  . CATARACT EXTRACTION W/PHACO Left 07/14/2015   Procedure: CATARACT EXTRACTION PHACO AND INTRAOCULAR LENS PLACEMENT (IOC);  Surgeon: [name], MD;  Location: [place];  Service: Ophthalmology;  Laterality: Left;  . CATARACT EXTRACTION  W/PHACO Right 10/01/2015   Procedure: CATARACT EXTRACTION PHACO AND INTRAOCULAR LENS PLACEMENT (IOC);  Surgeon: [name], MD;  Location: [place];  Service: Ophthalmology;  Laterality: Right;  . COLONOSCOPY W/ POLYPECTOMY     bleed after-had to go to surgery to stop bleeding via colonoscopy  . COLONOSCOPY WITH PROPOFOL N/A 11/19/2015   Procedure: COLONOSCOPY WITH PROPOFOL;  Surgeon: [name], MD;  Location: [facility] ENDOSCOPY;  Service: Endoscopy;  Laterality: N/A;  . DUPUYTREN CONTRACTURE RELEASE  12/15/2011   Procedure: DUPUYTREN CONTRACTURE RELEASE;  Surgeon: [name], MD;  Location: [place];  Service: Orthopedics;  Laterality: Left;  Fasciotomy left ring finger dupuytrens  . LUNG LOBECTOMY  06/10/13   upper left lung  . MULTIPLE TOOTH EXTRACTIONS    . REMOVAL RETAINED LENS Right 11/17/2015   Procedure: REMOVAL RETAINED LENS FROAGMENTS RIGHT EYE;  Surgeon: [name], MD;  Location: [place];  Service: Ophthalmology;  Laterality: Right;  . TONSILLECTOMY    . UPPER GASTROINTESTINAL ENDOSCOPY  11-03-15   Dr [name]  . VASECTOMY    . VIDEO ASSISTED THORACOSCOPY (VATS)/WEDGE RESECTION Left 06/04/2013   Procedure: VIDEO ASSISTED THORACOSCOPY (VATS)/WEDGE RESECTION;  Surgeon: [name], MD;  Location: [place];  Service: Thoracic;  Laterality: Left;  [name] VIDEO BRONCHOSCOPY N/A 06/04/2013   Procedure: VIDEO BRONCHOSCOPY;  Surgeon: Lilia Argue  Servando Snare, MD;  Location: [place] OR;  Service: Thoracic;  Laterality: N/A;    Family History  Problem Relation Age of Onset  . Stroke Mother   . Alzheimer's disease Mother   . Hypertension Sister   . Hyperlipidemia Sister   . Hypertension Brother   . Hyperlipidemia Brother   . Diabetes Brother   . Cancer Sister        lung and colon    Social History Social History  Substance Use Topics  . Smoking status: Former Smoker    Packs/day: 1.00    Years: 40.00    Types: Cigarettes    Quit  date: 12/09/1998  . Smokeless tobacco: Never Used  . Alcohol use No    Allergies  Allergen Reactions  . Latex Itching    Tape only- REACTED TO BANDAGE ON ABDOMEN  . Tape Itching    Surgical tapes     Current Outpatient Prescriptions  Medication Sig Dispense Refill  . amLODipine (NORVASC) 2.5 MG tablet Take 1 tablet (2.5 mg total) by mouth daily. 90 tablet 4  . atorvastatin (LIPITOR) 40 MG tablet TAKE 1 TABLET BY MOUTH AT BEDTIME FOR  CHOLESTEROL 30 tablet 0  . b complex vitamins capsule Take 1 capsule by mouth daily. AM    . carvedilol (COREG) 3.125 MG tablet TAKE ONE TABLET BY MOUTH ONCE DAILY IN AM    . Cinnamon 500 MG capsule Take 500 mg by mouth 2 (two) times daily. AM AND BEDTIME    . finasteride (PROSCAR) 5 MG tablet TAKE ONE TABLET BY MOUTH ONCE DAILY 90 tablet 3  . loratadine (CLARITIN) 10 MG tablet Take 10 mg by mouth daily. AM    . losartan (COZAAR) 100 MG tablet TAKE ONE TABLET BY MOUTH ONCE DAILY 90 tablet 3  . Multiple Vitamins-Minerals (MULTIVITAMIN WITH MINERALS) tablet Take 1 tablet by mouth daily. AM    . polycarbophil (FIBERCON) 625 MG tablet Take 625 mg by mouth daily. AM    . tamsulosin (FLOMAX) 0.4 MG CAPS capsule TAKE ONE CAPSULE BY MOUTH ONCE DAILY 90 capsule 3  . vitamin C (ASCORBIC ACID) 500 MG tablet Take 500 mg by mouth daily. AM     No current facility-administered medications for this visit.     Review of Systems Review of Systems  Constitutional: Negative.   Respiratory: Negative.   Cardiovascular: Negative.     Blood pressure 130/70, pulse 68, resp. rate 12, height 5\' 4"  (1.626 m), weight 160 lb (72.6 kg).  Physical Exam Physical Exam  Constitutional: He is oriented to person, place, and time. He appears well-developed and well-nourished.  Genitourinary:     Neurological: He is alert and oriented to person, place, and time.  Skin: Skin is warm and dry.    Data Reviewed Patient tolerated hemorrhoid banding well.  Assessment     Constipation secondary to somatostatin Depo shots.  Internal hemorrhoids  Plan    Patient tolerated banding well. Encouraged to make use of MiraLAX one cap full twice a day for the next 2-3 days and then one cap full daily to minimize constipation.       Return in three weeks possibly repeat hemorrhoid banding at that time. The patient is aware to call back for any questions or concerns. Patient is aware he should not experience any more pain or discomfort that he was (none) sees when he left the office.Dictation #1 [id number]  [id number]   HPI, Physical Exam, Assessment and Plan have been scribed under the  direction and in the presence of Hervey Ard, MD.  Gaspar Cola, CMA  I have completed the exam and reviewed the above documentation for accuracy and completeness.  I agree with the above.  Haematologist has been used and any errors in dictation or transcription are unintentional.  Hervey Ard, M.D., F.A.C.S. [name] 01/24/2017, 5:29 PM

## 2017-01-24 NOTE — Patient Instructions (Signed)
Return in three weeks possibly hemorrhoid banding ,last one. The patient is aware to call back for any questions or concerns.

## 2017-01-25 ENCOUNTER — Telehealth: Payer: Self-pay | Admitting: *Deleted

## 2017-01-25 NOTE — Telephone Encounter (Signed)
He called to let Dr [name] know that the band came off this morning. He states the area feels "ok" for now. He will monitor and call for further problems. F/u appt is 02-15-17 is there any thing else he needs to do until appointment ?

## 2017-01-25 NOTE — Telephone Encounter (Signed)
Nothing else to do.  F/U as planned.

## 2017-02-01 ENCOUNTER — Other Ambulatory Visit: Payer: Self-pay | Admitting: Oncology

## 2017-02-01 DIAGNOSIS — C7B8 Other secondary neuroendocrine tumors: Principal | ICD-10-CM

## 2017-02-01 DIAGNOSIS — C7A8 Other malignant neuroendocrine tumors: Secondary | ICD-10-CM

## 2017-02-01 NOTE — Progress Notes (Signed)
[place]  [phone] [phone]) [phone]  ID: Jason Malone OB: December 19, 1940  MR#: [id number]  JKK#:[id number]  Patient Care Team: Arnetha Courser, MD as PCP - General (Family Medicine) [name], Forest Gleason, MD (General Surgery) Nestor Lewandowsky, MD as Referring Physician (Cardiothoracic Surgery) Minna Merritts, MD as Consulting Physician (Cardiology) [name], MD as Consulting Physician (Cardiothoracic Surgery) Lloyd Huger, MD as Consulting Physician (Oncology) Sanda Klein Satira Anis, MD as Attending Physician (Family Medicine)  CHIEF COMPLAINT: 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  INTERVAL HISTORY: Patient returns to clinic today for further evaluation and continuation of Lanreotide. He currently feels well and is asymptomatic. He has no neurologic complaints. He denies any recent fevers or illnesses. He has a good appetite and denies weight loss. He denies any chest pain, shortness of breath, hemoptysis, or cough. He denies any abdominal pain. He has no nausea, vomiting, constipation, or diarrhea. He has no urinary complaints. Patient offers no specific complaints today.  REVIEW OF SYSTEMS:   Review of Systems  Constitutional: Negative.  Negative for fever, malaise/fatigue and weight loss.  Respiratory: Negative.  Negative for cough, hemoptysis and shortness of breath.   Cardiovascular: Negative.  Negative for chest pain and leg swelling.  Gastrointestinal: Negative for abdominal pain.  Genitourinary: Negative.   Musculoskeletal: Negative.   Neurological: Negative.  Negative for weakness.  Psychiatric/Behavioral: Negative.  The patient is not nervous/anxious.     As per HPI. Otherwise, a complete review of systems is negative.  PAST MEDICAL HISTORY: Past Medical History:  Diagnosis Date  . Allergic rhinitis   . Benign prostatic hypertrophy with lower urinary tract symptoms (LUTS)   . Chest pain    a.  05/2013 MV: EF 70%, no ischemia.  . Diverticulitis    DIVERTICULOSIS  . Enlarged prostate   . Essential hypertension    CONTROLLED ON MEDS  . Full dentures    upper and lower  . H/O hypokalemia   . Hx of atrial fibrillation without current medication    CARDIOLOGIST-DR [facility]  . 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 Hyperlipemia   . Hypomagnesemia   . IFG (impaired fasting glucose)   . Liver cancer ([place])   . Liver mass, left lobe 08/28/2016   Probably hemangioma; will get MRI of liver, refer to GI  . Lung cancer ([place])    a. carcinoid, left lung, Stage 1b (T2a, N0, cM0);  b. 05/2013 s/p VATS & LULobectomy.  . Monocytosis   . Paroxysmal atrial flutter (HCC)    a. 03/2013->no recurrence;  b. CHA2DS2VASc = 2-->not currently on anticoagulation;  c. 05/2012 Echo: EF 55-60%, normal RV.  [name] Wears glasses     PAST SURGICAL HISTORY: Past Surgical History:  Procedure Laterality Date  . BRONCHOSCOPY     04/06/2013  . CARDIOVASCULAR STRESS TEST  05/2013   a. No evidence of ischemia or infarct, EF 70%, no WMAs  . CATARACT EXTRACTION W/PHACO Left 07/14/2015   Procedure: CATARACT EXTRACTION PHACO AND INTRAOCULAR LENS PLACEMENT (IOC);  Surgeon: [name], MD;  Location: [place];  Service: Ophthalmology;  Laterality: Left;  . CATARACT EXTRACTION W/PHACO Right 10/01/2015   Procedure: CATARACT EXTRACTION PHACO AND INTRAOCULAR LENS PLACEMENT (IOC);  Surgeon: [name], MD;  Location: [place];  Service: Ophthalmology;  Laterality: Right;  . COLONOSCOPY W/ POLYPECTOMY     bleed after-had to go to surgery to stop bleeding via colonoscopy  .  COLONOSCOPY WITH PROPOFOL N/A 11/19/2015   Procedure: COLONOSCOPY WITH PROPOFOL;  Surgeon: [name], MD;  Location: [facility] ENDOSCOPY;  Service: Endoscopy;  Laterality: N/A;  . DUPUYTREN CONTRACTURE RELEASE  12/15/2011   Procedure: DUPUYTREN CONTRACTURE RELEASE;  Surgeon: [name], MD;   Location: [place];  Service: Orthopedics;  Laterality: Left;  Fasciotomy left ring finger dupuytrens  . LUNG LOBECTOMY  06/10/13   upper left lung  . MULTIPLE TOOTH EXTRACTIONS    . REMOVAL RETAINED LENS Right 11/17/2015   Procedure: REMOVAL RETAINED LENS FROAGMENTS RIGHT EYE;  Surgeon: [name], MD;  Location: [place];  Service: Ophthalmology;  Laterality: Right;  . TONSILLECTOMY    . UPPER GASTROINTESTINAL ENDOSCOPY  11-03-15   Dr [name]  . VASECTOMY    . VIDEO ASSISTED THORACOSCOPY (VATS)/WEDGE RESECTION Left 06/04/2013   Procedure: VIDEO ASSISTED THORACOSCOPY (VATS)/WEDGE RESECTION;  Surgeon: [name], MD;  Location: [place];  Service: Thoracic;  Laterality: Left;  [name] VIDEO BRONCHOSCOPY N/A 06/04/2013   Procedure: VIDEO BRONCHOSCOPY;  Surgeon: [name], MD;  Location: [facility] OR;  Service: Thoracic;  Laterality: N/A;    FAMILY HISTORY: Family History  Problem Relation Age of Onset  . Stroke Mother   . Alzheimer's disease Mother   . Hypertension Sister   . Hyperlipidemia Sister   . Hypertension Brother   . Hyperlipidemia Brother   . Diabetes Brother   . Cancer Sister        lung and colon    ADVANCED DIRECTIVES (Y/N):  N  HEALTH MAINTENANCE: Social History  Substance Use Topics  . Smoking status: Former Smoker    Packs/day: 1.00    Years: 40.00    Types: Cigarettes    Quit date: 12/09/1998  . Smokeless tobacco: Never Used  . Alcohol use No     Colonoscopy:  PAP:  Bone density:  Lipid panel:  Allergies  Allergen Reactions  . Latex Itching    Tape only- REACTED TO BANDAGE ON ABDOMEN  . Tape Itching    Surgical tapes     Current Outpatient Prescriptions  Medication Sig Dispense Refill  . amLODipine (NORVASC) 2.5 MG tablet Take 1 tablet (2.5 mg total) by mouth daily. 90 tablet 4  . atorvastatin (LIPITOR) 40 MG tablet TAKE 1 TABLET BY MOUTH AT BEDTIME FOR  CHOLESTEROL 30 tablet 0  . b complex vitamins capsule Take  1 capsule by mouth daily. AM    . carvedilol (COREG) 3.125 MG tablet TAKE ONE TABLET BY MOUTH ONCE DAILY IN AM    . Cinnamon 500 MG capsule Take 500 mg by mouth 2 (two) times daily. AM AND BEDTIME    . finasteride (PROSCAR) 5 MG tablet TAKE ONE TABLET BY MOUTH ONCE DAILY 90 tablet 3  . loratadine (CLARITIN) 10 MG tablet Take 10 mg by mouth daily. AM    . losartan (COZAAR) 100 MG tablet TAKE ONE TABLET BY MOUTH ONCE DAILY 90 tablet 3  . Multiple Vitamins-Minerals (MULTIVITAMIN WITH MINERALS) tablet Take 1 tablet by mouth daily. AM    . polycarbophil (FIBERCON) 625 MG tablet Take 625 mg by mouth daily. AM    . tamsulosin (FLOMAX) 0.4 MG CAPS capsule TAKE ONE CAPSULE BY MOUTH ONCE DAILY 90 capsule 3  . vitamin C (ASCORBIC ACID) 500 MG tablet Take 500 mg by mouth daily. AM     No current facility-administered medications for this visit.     OBJECTIVE: Vitals:   02/02/17 1149  BP:  121/78  Pulse: (!) 54  Resp: 18  Temp: 97.8 F (36.6 C)     Body mass index is 26.97 kg/m.    ECOG FS:0 - Asymptomatic  General: Well-developed, well-nourished, no acute distress. Eyes: Pink conjunctiva, anicteric sclera. Lungs: Clear to auscultation bilaterally. Heart: Regular rate and rhythm. No rubs, murmurs, or gallops. Abdomen: Soft, nontender, nondistended. No organomegaly noted, normoactive bowel sounds. Musculoskeletal: No edema, cyanosis, or clubbing. Neuro: Alert, answering all questions appropriately. Cranial nerves grossly intact. Skin: No rashes or petechiae noted. Psych: Normal affect.   LAB RESULTS:  Lab Results  Component Value Date   NA 138 02/02/2017   K 4.0 02/02/2017   CL 105 02/02/2017   CO2 25 02/02/2017   GLUCOSE 114 (H) 02/02/2017   BUN 15 02/02/2017   CREATININE 0.91 02/02/2017   CALCIUM 9.6 02/02/2017   PROT 7.0 02/02/2017   ALBUMIN 4.1 02/02/2017   AST 20 02/02/2017   ALT 13 (L) 02/02/2017   ALKPHOS 121 02/02/2017   BILITOT 0.9 02/02/2017   GFRNONAA >60 02/02/2017     GFRAA >60 02/02/2017    Lab Results  Component Value Date   WBC 8.8 02/02/2017   NEUTROABS 6.4 02/02/2017   HGB 14.8 02/02/2017   HCT 43.1 02/02/2017   MCV 88.0 02/02/2017   PLT 186 02/02/2017     STUDIES: No results found.  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  PLAN:    1. 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 Patient underwent left upper lobe lobectomy in [place] on June 04, 2013. Final pathology results revealed a well differentiated neuroendocrine tumor with positive bronchial margins. Patient did not require adjuvant XRT or chemotherapy at that time. Recent imaging and biopsy completed on December 20, 2016 revealed a recurrence of patient's known neuroendocrine disease in his liver. Patient's chromogranin A levels are also mildly elevated at 6.  Proceed with an 120 mg subcutaneous Lanreotide every 4 weeks. Patient will return to clinic in one and 2 months for Lanreotide only and then in 3 months for further evaluation. Can also consider referral to interventional radiology in the future for consideration of ablation. Plan to repeat imaging with MRI in approximately 6 months.   Approximately 30 minutes was spent in discussion of which greater than 50% was consultation.  Patient expressed understanding and was in agreement with this plan. He also understands that He can call clinic at any time with any questions, concerns, or complaints.   Cancer Staging Neuroendocrine carcinoma of lung [facility]) Staging form: Lung, AJCC 8th Edition - Clinical stage from 12/28/2016: Stage IVA (cT2a, cN0, cM1b) - Signed by Lloyd Huger, MD on 12/28/2016   Lloyd Huger, MD   02/06/2017 8:23 AM

## 2017-02-02 ENCOUNTER — Inpatient Hospital Stay: Payer: Medicare Other

## 2017-02-02 ENCOUNTER — Inpatient Hospital Stay: Payer: Medicare Other | Attending: Oncology

## 2017-02-02 ENCOUNTER — Inpatient Hospital Stay (HOSPITAL_BASED_OUTPATIENT_CLINIC_OR_DEPARTMENT_OTHER): Payer: Medicare Other | Admitting: Oncology

## 2017-02-02 VITALS — BP 121/78 | HR 54 | Temp 97.8°F | Resp 18 | Wt 157.1 lb

## 2017-02-02 DIAGNOSIS — C787 Secondary malignant neoplasm of liver and intrahepatic bile duct: Secondary | ICD-10-CM

## 2017-02-02 DIAGNOSIS — C7A8 Other malignant neuroendocrine tumors: Secondary | ICD-10-CM

## 2017-02-02 DIAGNOSIS — I10 Essential (primary) hypertension: Secondary | ICD-10-CM | POA: Insufficient documentation

## 2017-02-02 DIAGNOSIS — C7800 Secondary malignant neoplasm of unspecified lung: Secondary | ICD-10-CM | POA: Diagnosis not present

## 2017-02-02 DIAGNOSIS — Z79899 Other long term (current) drug therapy: Secondary | ICD-10-CM | POA: Diagnosis not present

## 2017-02-02 DIAGNOSIS — I4891 Unspecified atrial fibrillation: Secondary | ICD-10-CM | POA: Insufficient documentation

## 2017-02-02 DIAGNOSIS — E785 Hyperlipidemia, unspecified: Secondary | ICD-10-CM | POA: Diagnosis not present

## 2017-02-02 DIAGNOSIS — Z87891 Personal history of nicotine dependence: Secondary | ICD-10-CM | POA: Diagnosis not present

## 2017-02-02 DIAGNOSIS — C7B8 Other secondary neuroendocrine tumors: Secondary | ICD-10-CM

## 2017-02-02 DIAGNOSIS — N400 Enlarged prostate without lower urinary tract symptoms: Secondary | ICD-10-CM | POA: Diagnosis not present

## 2017-02-02 LAB — COMPREHENSIVE METABOLIC PANEL
ALBUMIN: 4.1 g/dL (ref 3.5–5.0)
ALT: 13 U/L — ABNORMAL LOW (ref 17–63)
AST: 20 U/L (ref 15–41)
Alkaline Phosphatase: 121 U/L (ref 38–126)
Anion gap: 8 (ref 5–15)
BUN: 15 mg/dL (ref 6–20)
CHLORIDE: 105 mmol/L (ref 101–111)
CO2: 25 mmol/L (ref 22–32)
Calcium: 9.6 mg/dL (ref 8.9–10.3)
Creatinine, Ser: 0.91 mg/dL (ref 0.61–1.24)
GFR calc Af Amer: >60 mL/min (ref >60)
Glucose, Bld: 114 mg/dL — ABNORMAL HIGH (ref 65–99)
POTASSIUM: 4 mmol/L (ref 3.5–5.1)
Sodium: 138 mmol/L (ref 135–145)
Total Bilirubin: 0.9 mg/dL (ref 0.3–1.2)
Total Protein: 7 g/dL (ref 6.5–8.1)

## 2017-02-02 LAB — CBC WITH DIFFERENTIAL/PLATELET
BASOS ABS: 0.1 10*3/uL (ref 0–0.1)
BASOS PCT: 1 %
EOS PCT: 3 %
Eosinophils Absolute: 0.3 10*3/uL (ref 0–0.7)
HCT: 43.1 % (ref 40.0–52.0)
Hemoglobin: 14.8 g/dL (ref 13.0–18.0)
Lymphocytes Relative: 10 %
Lymphs Abs: 0.9 10*3/uL — ABNORMAL LOW (ref 1.0–3.6)
MCH: 30.2 pg (ref 26.0–34.0)
MCHC: 34.3 g/dL (ref 32.0–36.0)
MCV: 88 fL (ref 80.0–100.0)
MONO ABS: 1.2 10*3/uL — AB (ref 0.2–1.0)
Monocytes Relative: 13 %
Neutro Abs: 6.4 10*3/uL (ref 1.4–6.5)
Neutrophils Relative %: 73 %
PLATELETS: 186 10*3/uL (ref 150–440)
RBC: 4.9 MIL/uL (ref 4.40–5.90)
RDW: 14 % (ref 11.5–14.5)
WBC: 8.8 10*3/uL (ref 3.8–10.6)

## 2017-02-02 MED ORDER — LANREOTIDE ACETATE 120 MG/0.5ML ~~LOC~~ SOLN
120.0000 mg | Freq: Once | SUBCUTANEOUS | Status: AC
  Administered 2017-02-02: 120 mg via SUBCUTANEOUS
  Filled 2017-02-02: qty 120

## 2017-02-02 NOTE — Progress Notes (Signed)
Patient denies any concerns today.  

## 2017-02-03 LAB — CHROMOGRANIN A: CHROMOGRANIN A: 6 nmol/L — AB (ref 0–5)

## 2017-02-14 ENCOUNTER — Telehealth: Payer: Self-pay | Admitting: Cardiovascular Disease

## 2017-02-14 ENCOUNTER — Ambulatory Visit (INDEPENDENT_AMBULATORY_CARE_PROVIDER_SITE_OTHER): Payer: Medicare Other | Admitting: Family Medicine

## 2017-02-14 ENCOUNTER — Encounter: Payer: Self-pay | Admitting: Family Medicine

## 2017-02-14 VITALS — BP 112/60 | HR 64 | Temp 97.4°F | Resp 16 | Wt 156.2 lb

## 2017-02-14 DIAGNOSIS — R7301 Impaired fasting glucose: Secondary | ICD-10-CM

## 2017-02-14 DIAGNOSIS — I10 Essential (primary) hypertension: Secondary | ICD-10-CM

## 2017-02-14 DIAGNOSIS — Z23 Encounter for immunization: Secondary | ICD-10-CM

## 2017-02-14 DIAGNOSIS — K648 Other hemorrhoids: Secondary | ICD-10-CM

## 2017-02-14 DIAGNOSIS — E782 Mixed hyperlipidemia: Secondary | ICD-10-CM

## 2017-02-14 DIAGNOSIS — I2510 Atherosclerotic heart disease of native coronary artery without angina pectoris: Secondary | ICD-10-CM | POA: Diagnosis not present

## 2017-02-14 DIAGNOSIS — C7A8 Other malignant neuroendocrine tumors: Secondary | ICD-10-CM

## 2017-02-14 DIAGNOSIS — I4892 Unspecified atrial flutter: Secondary | ICD-10-CM

## 2017-02-14 NOTE — Telephone Encounter (Signed)
Dr Sanda Klein calling asking for a provider to call back She is with patient of Dr Donivan Scull and is wanting to start patient on 28 Mg Coded Asprin   Would like to get some feedback  Please call

## 2017-02-14 NOTE — Patient Instructions (Signed)
We'll see what the cardiologist says about the aspirin Try to limit saturated fats in your diet (bologna, hot dogs, barbeque, cheeseburgers, hamburgers, steak, bacon, sausage, cheese, etc.) and get more fresh fruits, vegetables, and whole grains You received the flu shot today; it should protect you against the flu virus over the coming months; it will take about two weeks for antibodies to develop; do try to stay away from hospitals, nursing homes, and daycares during peak flu season; taking extra vitamin C daily during flu season may help you avoid getting sick

## 2017-02-14 NOTE — Telephone Encounter (Signed)
Sent page to Dr Fletcher Anon Nothing further needed.

## 2017-02-14 NOTE — Assessment & Plan Note (Signed)
NSR today 

## 2017-02-14 NOTE — Assessment & Plan Note (Signed)
Check fasting lipid with next set of labs, limit bacon

## 2017-02-14 NOTE — Progress Notes (Signed)
BP 112/60   Pulse 64   Temp (!) 97.4 F (36.3 C) (Oral)   Resp 16   Wt 156 lb 3.2 oz (70.9 kg)   SpO2 93%   BMI 26.81 kg/m    Subjective:    Patient ID: Jason Malone, male    DOB: [birth year], 76 y.o.   MRN: [id number]  HPI: Jason Malone is a 76 y.o. male  Chief Complaint  Patient presents with  . Follow-up    HPI Since last visit, saw oncologist; getting octreotide shots No fevers No nausea No night sweats No diarrhea; actually constipated Dr. [name] put him on Miralax; sees him tomorrow Hemorrhoids bothered him; took two out a few weeks ago, banded them; no active bleeding Flu shot today Atrial flutter; sees cardiologist Dr. Rockey Situ; sees him November; he has not been aware of any skips for years; no chest pain; he had low K+ and was resolved CAD; no chest pain, under care of cardiologist; reviewed note from Nov 2017 Prediabetes Lab Results  Component Value Date   HGBA1C 6.1 (H) 08/13/2016  he has DEXA soon  Lab Results  Component Value Date   CHOL 138 08/13/2016   HDL 37 (L) 08/13/2016   LDLCALC 75 08/13/2016   TRIG 132 08/13/2016   CHOLHDL 3.7 08/13/2016    Depression screen PHQ 2/9 02/14/2017 01/12/2017 12/17/2016 08/13/2016 02/12/2016  Decreased Interest 0 0 0 0 0  Down, Depressed, Hopeless 0 0 0 0 0  PHQ - 2 Score 0 0 0 0 0    Relevant past medical, surgical, family and social history reviewed Past Medical History:  Diagnosis Date  . Allergic rhinitis   . Benign prostatic hypertrophy with lower urinary tract symptoms (LUTS)   . Chest pain    a. 05/2013 MV: EF 70%, no ischemia.  . Diverticulitis    DIVERTICULOSIS  . Enlarged prostate   . Essential hypertension    CONTROLLED ON MEDS  . Full dentures    upper and lower  . H/O hypokalemia   . Hx of atrial fibrillation without current medication    CARDIOLOGIST-DR [facility]  . 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 Hyperlipemia   . Hypomagnesemia   . IFG (impaired  fasting glucose)   . Liver cancer ([place])   . Liver cancer ([place]) 12/08/2016  . Liver mass, left lobe 08/28/2016   Probably hemangioma; will get MRI of liver, refer to GI  . Lung cancer ([place])    a. carcinoid, left lung, Stage 1b (T2a, N0, cM0);  b. 05/2013 s/p VATS & LULobectomy.  . Monocytosis   . Paroxysmal atrial flutter (HCC)    a. 03/2013->no recurrence;  b. CHA2DS2VASc = 2-->not currently on anticoagulation;  c. 05/2012 Echo: EF 55-60%, normal RV.  [name] Wears glasses    Past Surgical History:  Procedure Laterality Date  . BRONCHOSCOPY     04/06/2013  . CARDIOVASCULAR STRESS TEST  05/2013   a. No evidence of ischemia or infarct, EF 70%, no WMAs  . CATARACT EXTRACTION W/PHACO Left 07/14/2015   Procedure: CATARACT EXTRACTION PHACO AND INTRAOCULAR LENS PLACEMENT (IOC);  Surgeon: [name], MD;  Location: [place];  Service: Ophthalmology;  Laterality: Left;  . CATARACT EXTRACTION W/PHACO Right 10/01/2015   Procedure: CATARACT EXTRACTION PHACO AND INTRAOCULAR LENS PLACEMENT (IOC);  Surgeon: [name], MD;  Location: [place];  Service: Ophthalmology;  Laterality: Right;  . COLONOSCOPY W/ POLYPECTOMY     bleed  after-had to go to surgery to stop bleeding via colonoscopy  . COLONOSCOPY WITH PROPOFOL N/A 11/19/2015   Procedure: COLONOSCOPY WITH PROPOFOL;  Surgeon: [name], MD;  Location: [facility] ENDOSCOPY;  Service: Endoscopy;  Laterality: N/A;  . DUPUYTREN CONTRACTURE RELEASE  12/15/2011   Procedure: DUPUYTREN CONTRACTURE RELEASE;  Surgeon: [name], MD;  Location: [place];  Service: Orthopedics;  Laterality: Left;  Fasciotomy left ring finger dupuytrens  . LUNG LOBECTOMY  06/10/13   upper left lung  . MULTIPLE TOOTH EXTRACTIONS    . REMOVAL RETAINED LENS Right 11/17/2015   Procedure: REMOVAL RETAINED LENS FROAGMENTS RIGHT EYE;  Surgeon: [name], MD;  Location: [place];  Service: Ophthalmology;  Laterality:  Right;  . TONSILLECTOMY    . UPPER GASTROINTESTINAL ENDOSCOPY  11-03-15   Dr [name]  . VASECTOMY    . VIDEO ASSISTED THORACOSCOPY (VATS)/WEDGE RESECTION Left 06/04/2013   Procedure: VIDEO ASSISTED THORACOSCOPY (VATS)/WEDGE RESECTION;  Surgeon: [name], MD;  Location: [place];  Service: Thoracic;  Laterality: Left;  [name] VIDEO BRONCHOSCOPY N/A 06/04/2013   Procedure: VIDEO BRONCHOSCOPY;  Surgeon: [name], MD;  Location: [facility] OR;  Service: Thoracic;  Laterality: N/A;   Family History  Problem Relation Age of Onset  . Stroke Mother   . Alzheimer's disease Mother   . Hypertension Sister   . Hyperlipidemia Sister   . Hypertension Brother   . Hyperlipidemia Brother   . Diabetes Brother   . Cancer Sister        lung and colon   Social History   Social History  . Marital status: Married    Spouse name: N/A  . Number of children: N/A  . Years of education: N/A   Occupational History  . Not on file.   Social History Main Topics  . Smoking status: Former Smoker    Packs/day: 1.00    Years: 40.00    Types: Cigarettes    Quit date: 12/09/1998  . Smokeless tobacco: Never Used  . Alcohol use No  . Drug use: No  . Sexual activity: Yes   Other Topics Concern  . Not on file   Social History Narrative  . No narrative on file    Interim medical history since last visit reviewed. Allergies and medications reviewed  Review of Systems Per HPI unless specifically indicated above     Objective:    BP 112/60   Pulse 64   Temp (!) 97.4 F (36.3 C) (Oral)   Resp 16   Wt 156 lb 3.2 oz (70.9 kg)   SpO2 93%   BMI 26.81 kg/m   Wt Readings from Last 3 Encounters:  02/14/17 156 lb 3.2 oz (70.9 kg)  02/02/17 157 lb 1.6 oz (71.3 kg)  01/24/17 160 lb (72.6 kg)    Physical Exam  Constitutional: He appears well-developed and well-nourished. No distress.  HENT:  Head: Normocephalic and atraumatic.  Eyes: EOM are normal. No scleral icterus.  Neck: No thyromegaly  present.  Cardiovascular: Normal rate and regular rhythm.   No extrasystoles are present.  Pulmonary/Chest: Effort normal and breath sounds normal.  Abdominal: Soft. Bowel sounds are normal. He exhibits no distension.  Musculoskeletal: He exhibits no edema.       Lumbar back: He exhibits no tenderness, no bony tenderness and no deformity.  Neurological: He is alert.  Skin: Skin is warm and dry. No bruising and no rash noted. No pallor.  Psychiatric: He has a normal mood  and affect. His behavior is normal. Judgment and thought content normal. His mood appears not anxious. He does not exhibit a depressed mood.    Results for orders placed or performed in visit on 02/02/17  CBC with Differential/Platelet  Result Value Ref Range   WBC 8.8 3.8 - 10.6 K/uL   RBC 4.90 4.40 - 5.90 MIL/uL   Hemoglobin 14.8 13.0 - 18.0 g/dL   HCT 43.1 40.0 - 52.0 %   MCV 88.0 80.0 - 100.0 fL   MCH 30.2 26.0 - 34.0 pg   MCHC 34.3 32.0 - 36.0 g/dL   RDW 14.0 11.5 - 14.5 %   Platelets 186 150 - 440 K/uL   Neutrophils Relative % 73 %   Neutro Abs 6.4 1.4 - 6.5 K/uL   Lymphocytes Relative 10 %   Lymphs Abs 0.9 (L) 1.0 - 3.6 K/uL   Monocytes Relative 13 %   Monocytes Absolute 1.2 (H) 0.2 - 1.0 K/uL   Eosinophils Relative 3 %   Eosinophils Absolute 0.3 0 - 0.7 K/uL   Basophils Relative 1 %   Basophils Absolute 0.1 0 - 0.1 K/uL  Comprehensive metabolic panel  Result Value Ref Range   Sodium 138 135 - 145 mmol/L   Potassium 4.0 3.5 - 5.1 mmol/L   Chloride 105 101 - 111 mmol/L   CO2 25 22 - 32 mmol/L   Glucose, Bld 114 (H) 65 - 99 mg/dL   BUN 15 6 - 20 mg/dL   Creatinine, Ser 0.91 0.61 - 1.24 mg/dL   Calcium 9.6 8.9 - 10.3 mg/dL   Total Protein 7.0 6.5 - 8.1 g/dL   Albumin 4.1 3.5 - 5.0 g/dL   AST 20 15 - 41 U/L   ALT 13 (L) 17 - 63 U/L   Alkaline Phosphatase 121 38 - 126 U/L   Total Bilirubin 0.9 0.3 - 1.2 mg/dL   GFR calc non Af Amer >60 >60 mL/min   GFR calc Af Amer >60 >60 mL/min   Anion gap 8 5 -  15  Chromogranin A  Result Value Ref Range   Chromogranin A 6 (H) 0 - 5 nmol/L      Assessment & Plan:   Problem List Items Addressed This Visit      Cardiovascular and Mediastinum   Paroxysmal atrial flutter (HCC)    NSR today      Internal hemorrhoids with complication    With Dr. [name]; fiber and water      Essential hypertension    Excellent control      Coronary artery disease involving native heart without angina pectoris    Call placed to cardiologist about aspirin therapy (my recommendation); no allergies to this      Atrial flutter (HCC)    Under control, monitored by cardiologist        Respiratory   Neuroendocrine carcinoma of lung ([place])    Met to liver on treatment, seeing oncologist        Endocrine   IFG (impaired fasting glucose) - Primary    Will put orders in for 3 months a1c      Relevant Orders   Hemoglobin A1c     Other   Hyperlipidemia    Check fasting lipid with next set of labs, limit bacon      Relevant Orders   Lipid panel    Other Visit Diagnoses    Needs flu shot       Relevant Orders   Flu vaccine HIGH  DOSE PF (Fluzone High dose) (Completed)       Follow up plan: Return in about 6 months (around 08/15/2017) for twenty minute follow-up with fasting labs.  An after-visit summary was printed and given to the patient at [facility].  Please see the patient instructions which may contain other information and recommendations beyond what is mentioned above in the assessment and plan.  No orders of the defined types were placed in this encounter.   Orders Placed This Encounter  Procedures  . Flu vaccine HIGH DOSE PF (Fluzone High dose)  . Hemoglobin A1c  . Lipid panel

## 2017-02-14 NOTE — Assessment & Plan Note (Signed)
Under control, monitored by cardiologist

## 2017-02-14 NOTE — Assessment & Plan Note (Signed)
Met to liver on treatment, seeing oncologist

## 2017-02-14 NOTE — Assessment & Plan Note (Signed)
Excellent control.   

## 2017-02-14 NOTE — Assessment & Plan Note (Signed)
With Dr. [name]; fiber and water

## 2017-02-14 NOTE — Assessment & Plan Note (Signed)
Call placed to cardiologist about aspirin therapy (my recommendation); no allergies to this

## 2017-02-14 NOTE — Assessment & Plan Note (Signed)
Will put orders in for 3 months a1c

## 2017-02-15 ENCOUNTER — Ambulatory Visit (INDEPENDENT_AMBULATORY_CARE_PROVIDER_SITE_OTHER): Payer: Medicare Other | Admitting: General Surgery

## 2017-02-15 ENCOUNTER — Encounter: Payer: Self-pay | Admitting: General Surgery

## 2017-02-15 VITALS — BP 112/58 | HR 70 | Resp 14 | Ht 63.0 in | Wt 156.0 lb

## 2017-02-15 DIAGNOSIS — K648 Other hemorrhoids: Secondary | ICD-10-CM

## 2017-02-15 LAB — LIPID PANEL
CHOL/HDL RATIO: 3.4 ratio (ref 0.0–5.0)
CHOLESTEROL TOTAL: 133 mg/dL (ref 100–199)
HDL: 39 mg/dL — AB (ref >39)
LDL CALC: 70 mg/dL (ref 0–99)
TRIGLYCERIDES: 118 mg/dL (ref 0–149)
VLDL Cholesterol Cal: 24 mg/dL (ref 5–40)

## 2017-02-15 LAB — HEMOGLOBIN A1C
ESTIMATED AVERAGE GLUCOSE: 131 mg/dL
HEMOGLOBIN A1C: 6.2 % — AB (ref 4.8–5.6)

## 2017-02-15 NOTE — Patient Instructions (Signed)
Patient to return as needed. The patient is aware to call back for any questions or concerns. 

## 2017-02-15 NOTE — Progress Notes (Signed)
Patient ID: Jason Malone, male   DOB: Apr 10, 1941, 76 y.o.   MRN: [id number]  Chief Complaint  Patient presents with  . Hemorrhoids    HPI Jason Malone is a 76 y.o. male here today for his follow up from hemorrhoid banding done at his last visit. Patient states he has not bleed in the last week.  HPI  Past Medical History:  Diagnosis Date  . Allergic rhinitis   . Benign prostatic hypertrophy with lower urinary tract symptoms (LUTS)   . Chest pain    a. 05/2013 MV: EF 70%, no ischemia.  . Diverticulitis    DIVERTICULOSIS  . Enlarged prostate   . Essential hypertension    CONTROLLED ON MEDS  . Full dentures    upper and lower  . H/O hypokalemia   . Hx of atrial fibrillation without current medication    CARDIOLOGIST-DR [facility]  . 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 Hyperlipemia   . Hypomagnesemia   . IFG (impaired fasting glucose)   . Liver cancer ([place])   . Liver cancer ([place]) 12/08/2016  . Liver mass, left lobe 08/28/2016   Probably hemangioma; will get MRI of liver, refer to GI  . Lung cancer ([place])    a. carcinoid, left lung, Stage 1b (T2a, N0, cM0);  b. 05/2013 s/p VATS & LULobectomy.  . Monocytosis   . Paroxysmal atrial flutter (HCC)    a. 03/2013->no recurrence;  b. CHA2DS2VASc = 2-->not currently on anticoagulation;  c. 05/2012 Echo: EF 55-60%, normal RV.  [name] Wears glasses     Past Surgical History:  Procedure Laterality Date  . BRONCHOSCOPY     04/06/2013  . CARDIOVASCULAR STRESS TEST  05/2013   a. No evidence of ischemia or infarct, EF 70%, no WMAs  . CATARACT EXTRACTION W/PHACO Left 07/14/2015   Procedure: CATARACT EXTRACTION PHACO AND INTRAOCULAR LENS PLACEMENT (IOC);  Surgeon: [name], MD;  Location: [place];  Service: Ophthalmology;  Laterality: Left;  . CATARACT EXTRACTION W/PHACO Right 10/01/2015   Procedure: CATARACT EXTRACTION PHACO AND INTRAOCULAR LENS PLACEMENT (IOC);  Surgeon: [name], MD;   Location: [place];  Service: Ophthalmology;  Laterality: Right;  . COLONOSCOPY W/ POLYPECTOMY     bleed after-had to go to surgery to stop bleeding via colonoscopy  . COLONOSCOPY WITH PROPOFOL N/A 11/19/2015   Procedure: COLONOSCOPY WITH PROPOFOL;  Surgeon: [name], MD;  Location: [facility] ENDOSCOPY;  Service: Endoscopy;  Laterality: N/A;  . DUPUYTREN CONTRACTURE RELEASE  12/15/2011   Procedure: DUPUYTREN CONTRACTURE RELEASE;  Surgeon: [name], MD;  Location: [place];  Service: Orthopedics;  Laterality: Left;  Fasciotomy left ring finger dupuytrens  . LUNG LOBECTOMY  06/10/13   upper left lung  . MULTIPLE TOOTH EXTRACTIONS    . REMOVAL RETAINED LENS Right 11/17/2015   Procedure: REMOVAL RETAINED LENS FROAGMENTS RIGHT EYE;  Surgeon: [name], MD;  Location: [place];  Service: Ophthalmology;  Laterality: Right;  . TONSILLECTOMY    . UPPER GASTROINTESTINAL ENDOSCOPY  11-03-15   Dr [name]  . VASECTOMY    . VIDEO ASSISTED THORACOSCOPY (VATS)/WEDGE RESECTION Left 06/04/2013   Procedure: VIDEO ASSISTED THORACOSCOPY (VATS)/WEDGE RESECTION;  Surgeon: [name], MD;  Location: [place];  Service: Thoracic;  Laterality: Left;  [name] VIDEO BRONCHOSCOPY N/A 06/04/2013   Procedure: VIDEO BRONCHOSCOPY;  Surgeon: [name], MD;  Location: [place];  Service: Thoracic;  Laterality: N/A;  Family History  Problem Relation Age of Onset  . Stroke Mother   . Alzheimer's disease Mother   . Hypertension Sister   . Hyperlipidemia Sister   . Hypertension Brother   . Hyperlipidemia Brother   . Diabetes Brother   . Cancer Sister        lung and colon    Social History Social History  Substance Use Topics  . Smoking status: Former Smoker    Packs/day: 1.00    Years: 40.00    Types: Cigarettes    Quit date: 12/09/1998  . Smokeless tobacco: Never Used  . Alcohol use No    Allergies  Allergen Reactions  . Latex Itching    Tape only-  REACTED TO BANDAGE ON ABDOMEN  . Tape Itching    Surgical tapes     Current Outpatient Prescriptions  Medication Sig Dispense Refill  . amLODipine (NORVASC) 2.5 MG tablet Take 1 tablet (2.5 mg total) by mouth daily. 90 tablet 4  . atorvastatin (LIPITOR) 40 MG tablet TAKE 1 TABLET BY MOUTH AT BEDTIME FOR  CHOLESTEROL 30 tablet 0  . b complex vitamins capsule Take 1 capsule by mouth daily. AM    . carvedilol (COREG) 3.125 MG tablet TAKE ONE TABLET BY MOUTH ONCE DAILY IN AM    . Cinnamon 500 MG capsule Take 500 mg by mouth 2 (two) times daily. AM AND BEDTIME    . finasteride (PROSCAR) 5 MG tablet TAKE ONE TABLET BY MOUTH ONCE DAILY 90 tablet 3  . loratadine (CLARITIN) 10 MG tablet Take 10 mg by mouth daily. AM    . losartan (COZAAR) 100 MG tablet TAKE ONE TABLET BY MOUTH ONCE DAILY 90 tablet 3  . Multiple Vitamins-Minerals (MULTIVITAMIN WITH MINERALS) tablet Take 1 tablet by mouth daily. AM    . polycarbophil (FIBERCON) 625 MG tablet Take 625 mg by mouth daily. AM    . tamsulosin (FLOMAX) 0.4 MG CAPS capsule TAKE ONE CAPSULE BY MOUTH ONCE DAILY 90 capsule 3  . vitamin C (ASCORBIC ACID) 500 MG tablet Take 500 mg by mouth daily. AM     No current facility-administered medications for this visit.     Review of Systems Review of Systems  Constitutional: Negative.   Respiratory: Negative.   Cardiovascular: Negative.     Blood pressure (!) 112/58, pulse 70, resp. rate 14, height 5\' 3"  (1.6 m), weight 156 lb (70.8 kg).  Physical Exam Physical Exam  Constitutional: He is oriented to person, place, and time. He appears well-developed and well-nourished.  Genitourinary: Rectal exam shows no external hemorrhoid, no internal hemorrhoid and no fissure.  Genitourinary Comments: O prolapsing tissue with vigorous straining. No tenderness.  Neurological: He is alert and oriented to person, place, and time.  Skin: Skin is warm and dry.       Assessment    Resolution of internal hemorrhoid  symptoms.    Plan        Patient to return as needed. The patient is aware to call back for any questions or concerns.   HPI, Physical Exam, Assessment and Plan have been scribed under the direction and in the presence of Hervey Ard, MD.  Gaspar Cola, CMA  I have completed the exam and reviewed the above documentation for accuracy and completeness.  I agree with the above.  Haematologist has been used and any errors in dictation or transcription are unintentional.  Hervey Ard, M.D., F.A.C.S.  [name] 02/15/2017, 10:12 AM

## 2017-02-16 ENCOUNTER — Ambulatory Visit
Admission: RE | Admit: 2017-02-16 | Discharge: 2017-02-16 | Disposition: A | Discharge status: Home / Self Care | Payer: Medicare Other | Source: Ambulatory Visit | Attending: Family Medicine | Admitting: Family Medicine

## 2017-02-16 ENCOUNTER — Encounter: Payer: Self-pay | Admitting: Family Medicine

## 2017-02-16 DIAGNOSIS — M8588 Other specified disorders of bone density and structure, other site: Secondary | ICD-10-CM | POA: Insufficient documentation

## 2017-02-16 DIAGNOSIS — M8580 Other specified disorders of bone density and structure, unspecified site: Secondary | ICD-10-CM

## 2017-02-16 DIAGNOSIS — M85851 Other specified disorders of bone density and structure, right thigh: Secondary | ICD-10-CM | POA: Diagnosis not present

## 2017-02-26 ENCOUNTER — Other Ambulatory Visit: Payer: Self-pay | Admitting: Family Medicine

## 2017-02-28 ENCOUNTER — Other Ambulatory Visit: Payer: Self-pay | Admitting: Cardiovascular Disease

## 2017-02-28 MED ORDER — CARVEDILOL 3.125 MG PO TABS: ORAL_TABLET | ORAL | 3 refills | Status: DC

## 2017-02-28 NOTE — Telephone Encounter (Signed)
Refill sent for Coreg 3.125 mg one tablet daily.

## 2017-02-28 NOTE — Telephone Encounter (Signed)
Patient notified

## 2017-02-28 NOTE — Telephone Encounter (Signed)
°*  STAT* If patient is at the pharmacy, call can be transferred to refill team.   1. Which medications need to be refilled? (please list name of each medication and dose if known)  Carvedilol   2. Which pharmacy/location (including street and city if local pharmacy) is medication to be sent to?[facility] in [place]   3. Do they need a 30 day or 90 day supply? 90 day

## 2017-02-28 NOTE — Telephone Encounter (Signed)
Last lipids and ALT reviewed; Rx approved

## 2017-02-28 NOTE — Telephone Encounter (Signed)
I'm going to ask that his cardiologist manage these refills going forward; I think he changed it to once a day, so we'll respectfully ask Dr. Rockey Situ to adjust and write for refills; thank you

## 2017-03-02 ENCOUNTER — Inpatient Hospital Stay: Payer: Medicare Other

## 2017-03-02 ENCOUNTER — Inpatient Hospital Stay: Payer: Medicare Other | Attending: Oncology

## 2017-03-02 DIAGNOSIS — C7A8 Other malignant neuroendocrine tumors: Secondary | ICD-10-CM

## 2017-03-02 DIAGNOSIS — Z79899 Other long term (current) drug therapy: Secondary | ICD-10-CM | POA: Diagnosis not present

## 2017-03-02 DIAGNOSIS — I10 Essential (primary) hypertension: Secondary | ICD-10-CM | POA: Diagnosis not present

## 2017-03-02 DIAGNOSIS — C7800 Secondary malignant neoplasm of unspecified lung: Secondary | ICD-10-CM | POA: Insufficient documentation

## 2017-03-02 DIAGNOSIS — N400 Enlarged prostate without lower urinary tract symptoms: Secondary | ICD-10-CM | POA: Diagnosis not present

## 2017-03-02 DIAGNOSIS — C7B8 Other secondary neuroendocrine tumors: Secondary | ICD-10-CM

## 2017-03-02 DIAGNOSIS — E785 Hyperlipidemia, unspecified: Secondary | ICD-10-CM | POA: Insufficient documentation

## 2017-03-02 DIAGNOSIS — C787 Secondary malignant neoplasm of liver and intrahepatic bile duct: Secondary | ICD-10-CM | POA: Diagnosis not present

## 2017-03-02 DIAGNOSIS — Z87891 Personal history of nicotine dependence: Secondary | ICD-10-CM | POA: Insufficient documentation

## 2017-03-02 DIAGNOSIS — I4891 Unspecified atrial fibrillation: Secondary | ICD-10-CM | POA: Diagnosis not present

## 2017-03-02 LAB — COMPREHENSIVE METABOLIC PANEL
ALK PHOS: 118 U/L (ref 38–126)
ALT: 18 U/L (ref 17–63)
AST: 23 U/L (ref 15–41)
Albumin: 4.2 g/dL (ref 3.5–5.0)
Anion gap: 9 (ref 5–15)
BUN: 17 mg/dL (ref 6–20)
CALCIUM: 9.7 mg/dL (ref 8.9–10.3)
CHLORIDE: 105 mmol/L (ref 101–111)
CO2: 26 mmol/L (ref 22–32)
Creatinine, Ser: 0.87 mg/dL (ref 0.61–1.24)
GFR calc non Af Amer: >60 mL/min (ref >60)
GLUCOSE: 124 mg/dL — AB (ref 65–99)
Potassium: 4.9 mmol/L (ref 3.5–5.1)
SODIUM: 140 mmol/L (ref 135–145)
TOTAL PROTEIN: 7.3 g/dL (ref 6.5–8.1)
Total Bilirubin: 1 mg/dL (ref 0.3–1.2)

## 2017-03-02 LAB — CBC WITH DIFFERENTIAL/PLATELET
BASOS ABS: 0.1 10*3/uL (ref 0–0.1)
Basophils Relative: 1 %
EOS ABS: 0.3 10*3/uL (ref 0–0.7)
EOS PCT: 3 %
HCT: 44.9 % (ref 40.0–52.0)
HEMOGLOBIN: 15.2 g/dL (ref 13.0–18.0)
LYMPHS ABS: 1 10*3/uL (ref 1.0–3.6)
Lymphocytes Relative: 13 %
MCH: 30.2 pg (ref 26.0–34.0)
MCHC: 33.8 g/dL (ref 32.0–36.0)
MCV: 89.4 fL (ref 80.0–100.0)
Monocytes Absolute: 1 10*3/uL (ref 0.2–1.0)
Monocytes Relative: 13 %
NEUTROS PCT: 70 %
Neutro Abs: 5.4 10*3/uL (ref 1.4–6.5)
PLATELETS: 196 10*3/uL (ref 150–440)
RBC: 5.02 MIL/uL (ref 4.40–5.90)
RDW: 14.3 % (ref 11.5–14.5)
WBC: 7.8 10*3/uL (ref 3.8–10.6)

## 2017-03-02 MED ORDER — LANREOTIDE ACETATE 120 MG/0.5ML ~~LOC~~ SOLN
120.0000 mg | Freq: Once | SUBCUTANEOUS | Status: AC
  Administered 2017-03-02: 120 mg via SUBCUTANEOUS
  Filled 2017-03-02: qty 120

## 2017-03-04 LAB — CHROMOGRANIN A: Chromogranin A: 6 nmol/L — ABNORMAL HIGH (ref 0–5)

## 2017-04-06 ENCOUNTER — Inpatient Hospital Stay: Payer: Medicare Other | Attending: Oncology

## 2017-04-06 ENCOUNTER — Inpatient Hospital Stay: Payer: Medicare Other

## 2017-04-06 DIAGNOSIS — I10 Essential (primary) hypertension: Secondary | ICD-10-CM | POA: Diagnosis not present

## 2017-04-06 DIAGNOSIS — C7800 Secondary malignant neoplasm of unspecified lung: Secondary | ICD-10-CM | POA: Insufficient documentation

## 2017-04-06 DIAGNOSIS — C787 Secondary malignant neoplasm of liver and intrahepatic bile duct: Secondary | ICD-10-CM | POA: Diagnosis not present

## 2017-04-06 DIAGNOSIS — N400 Enlarged prostate without lower urinary tract symptoms: Secondary | ICD-10-CM | POA: Insufficient documentation

## 2017-04-06 DIAGNOSIS — Z79899 Other long term (current) drug therapy: Secondary | ICD-10-CM | POA: Insufficient documentation

## 2017-04-06 DIAGNOSIS — I4891 Unspecified atrial fibrillation: Secondary | ICD-10-CM | POA: Insufficient documentation

## 2017-04-06 DIAGNOSIS — Z87891 Personal history of nicotine dependence: Secondary | ICD-10-CM | POA: Insufficient documentation

## 2017-04-06 DIAGNOSIS — R785 Finding of other psychotropic drug in blood: Secondary | ICD-10-CM | POA: Insufficient documentation

## 2017-04-06 DIAGNOSIS — C7B8 Other secondary neuroendocrine tumors: Secondary | ICD-10-CM

## 2017-04-06 DIAGNOSIS — C7A8 Other malignant neuroendocrine tumors: Secondary | ICD-10-CM | POA: Insufficient documentation

## 2017-04-06 LAB — COMPREHENSIVE METABOLIC PANEL
ALK PHOS: 106 U/L (ref 38–126)
ALT: 19 U/L (ref 17–63)
AST: 22 U/L (ref 15–41)
Albumin: 4.3 g/dL (ref 3.5–5.0)
Anion gap: 7 (ref 5–15)
BILIRUBIN TOTAL: 1.1 mg/dL (ref 0.3–1.2)
BUN: 23 mg/dL — AB (ref 6–20)
CALCIUM: 9.9 mg/dL (ref 8.9–10.3)
CO2: 28 mmol/L (ref 22–32)
CREATININE: 0.91 mg/dL (ref 0.61–1.24)
Chloride: 105 mmol/L (ref 101–111)
GFR calc Af Amer: >60 mL/min (ref >60)
Glucose, Bld: 114 mg/dL — ABNORMAL HIGH (ref 65–99)
Potassium: 4.9 mmol/L (ref 3.5–5.1)
Sodium: 140 mmol/L (ref 135–145)
TOTAL PROTEIN: 7.3 g/dL (ref 6.5–8.1)

## 2017-04-06 LAB — CBC WITH DIFFERENTIAL/PLATELET
BASOS PCT: 1 %
Basophils Absolute: 0.1 10*3/uL (ref 0–0.1)
EOS ABS: 0.2 10*3/uL (ref 0–0.7)
EOS PCT: 3 %
HCT: 45.3 % (ref 40.0–52.0)
Hemoglobin: 15.2 g/dL (ref 13.0–18.0)
LYMPHS ABS: 1.1 10*3/uL (ref 1.0–3.6)
Lymphocytes Relative: 14 %
MCH: 30.4 pg (ref 26.0–34.0)
MCHC: 33.6 g/dL (ref 32.0–36.0)
MCV: 90.2 fL (ref 80.0–100.0)
Monocytes Absolute: 1.1 10*3/uL — ABNORMAL HIGH (ref 0.2–1.0)
Monocytes Relative: 14 %
Neutro Abs: 5.3 10*3/uL (ref 1.4–6.5)
Neutrophils Relative %: 68 %
PLATELETS: 177 10*3/uL (ref 150–440)
RBC: 5.02 MIL/uL (ref 4.40–5.90)
RDW: 14.2 % (ref 11.5–14.5)
WBC: 7.7 10*3/uL (ref 3.8–10.6)

## 2017-04-06 MED ORDER — LANREOTIDE ACETATE 120 MG/0.5ML ~~LOC~~ SOLN
120.0000 mg | Freq: Once | SUBCUTANEOUS | Status: AC
  Administered 2017-04-06: 120 mg via SUBCUTANEOUS
  Filled 2017-04-06: qty 120

## 2017-04-07 LAB — CHROMOGRANIN A: Chromogranin A: 7 nmol/L — ABNORMAL HIGH (ref 0–5)

## 2017-04-07 NOTE — Progress Notes (Signed)
Cardiology Office Note  Date:  04/08/2017   ID:  Jason Malone, DOB 1941-01-16, MRN [id number]  PCP:  Jason Courser, MD   Chief Complaint  Patient presents with  . other    12 month follow up. Meds reviewed by the pt. verbally. Recently diagnosed with liver cancer; receives montly inj. of Somatuline 120 mg.     HPI:  Jason Malone is a 76 year-old gentleman with long history of  smoking,   stopped 2001 lung cancer on the left, status post resection January 2015,  hypertension,  hyperlipidemia  hospital 03/29/2013 with tachycardia, shortness of breath, atrial flutter, lung CA  treated with rate controlling medications, and converted to normal sinus rhythm on arrival to the hospital with potassium 3.1, low magnesium felt secondary to his HCTZ. This was stopped. He presents today for follow-up of his atrial flutter  Now with liver met from lung 3 cm x 2.6 cm Receiving one shot once a month Side effects include fatigue, GI changes Last shot 2 days ago  Chronic bradycardia Denies any orthostasis Takes carvedilol 3.125 mg in the morning  Lab work reviewed with him HBA1C 6.2 Cholesterol at goal  Previous studies CT chest  Mild aortic atherosclerosis, arch, descending aorta, Minimal CAD, calcification aortic valve (?left main)  Prior surgery performed by Jason Malone   EKG on today's visit shows sinus bradycardia rate 48 bpm, no significant ST or T-wave changes  Other past medical history reviewed Echocardiogram 03/29/2013 shows atrial flutter, ejection fraction 55-60%, otherwise normal study As mentioned, potassium on 03/29/2013 was 3.1 magnesium 1.6, cardiac enzymes negative, TSH 3.5  CT scan of the chest shows 4.6 x 4.1 x 4.2 cm left hilar mass encasing the left pulmonary artery branches Recent lab work showing total cholesterol 171, HDL 39, LDL 86 prior  stress test 05/22/2013 showing no ischemia  EKG today shows normal sinus rhythm with rate 55 beats per minute, no  significant ST or T wave changes   PMH:   has a past medical history of Allergic rhinitis, Benign prostatic hypertrophy with lower urinary tract symptoms (LUTS), Chest pain, Diverticulitis, Enlarged prostate, Essential hypertension, Full dentures, H/O hypokalemia, atrial fibrillation without current medication, malignant carcinoid tumor of bronchus and lung (08/28/2016), Hyperlipemia, Hypomagnesemia, IFG (impaired fasting glucose), Liver cancer ([place]), Liver cancer ([place]) (12/08/2016), Liver mass, left lobe (08/28/2016), Lung cancer ([place]), Monocytosis, Osteopenia, Paroxysmal atrial flutter ([place]), and Wears glasses.  PSH:    Past Surgical History:  Procedure Laterality Date  . BRONCHOSCOPY     04/06/2013  . CARDIOVASCULAR STRESS TEST  05/2013   a. No evidence of ischemia or infarct, EF 70%, no WMAs  . CATARACT EXTRACTION W/PHACO Left 07/14/2015   Procedure: CATARACT EXTRACTION PHACO AND INTRAOCULAR LENS PLACEMENT (IOC);  Surgeon: Jason Koyanagi, MD;  Location: [place];  Service: Ophthalmology;  Laterality: Left;  . CATARACT EXTRACTION W/PHACO Right 10/01/2015   Procedure: CATARACT EXTRACTION PHACO AND INTRAOCULAR LENS PLACEMENT (IOC);  Surgeon: Jason Koyanagi, MD;  Location: [place];  Service: Ophthalmology;  Laterality: Right;  . COLONOSCOPY W/ POLYPECTOMY     bleed after-had to go to surgery to stop bleeding via colonoscopy  . COLONOSCOPY WITH PROPOFOL N/A 11/19/2015   Procedure: COLONOSCOPY WITH PROPOFOL;  Surgeon: Jason Bellow, MD;  Location: [facility] ENDOSCOPY;  Service: Endoscopy;  Laterality: N/A;  . DUPUYTREN CONTRACTURE RELEASE  12/15/2011   Procedure: DUPUYTREN CONTRACTURE RELEASE;  Surgeon: Jason Sours, MD;  Location: [place];  Service: Orthopedics;  Laterality: Left;  Fasciotomy left ring finger dupuytrens  . LUNG LOBECTOMY  06/10/13   upper left lung  . MULTIPLE TOOTH EXTRACTIONS    . REMOVAL RETAINED LENS Right 11/17/2015   Procedure:  REMOVAL RETAINED LENS FROAGMENTS RIGHT EYE;  Surgeon: Jason Koyanagi, MD;  Location: [place];  Service: Ophthalmology;  Laterality: Right;  . TONSILLECTOMY    . UPPER GASTROINTESTINAL ENDOSCOPY  11-03-15   Dr Jason Malone  . VASECTOMY    . VIDEO ASSISTED THORACOSCOPY (VATS)/WEDGE RESECTION Left 06/04/2013   Procedure: VIDEO ASSISTED THORACOSCOPY (VATS)/WEDGE RESECTION;  Surgeon: Jason Isaac, MD;  Location: [place];  Service: Thoracic;  Laterality: Left;  [name] VIDEO BRONCHOSCOPY N/A 06/04/2013   Procedure: VIDEO BRONCHOSCOPY;  Surgeon: Jason Isaac, MD;  [facility] OR;  Service: Thoracic;  Laterality: N/A;    Current Outpatient Medications  Medication Sig Dispense Refill  . amLODipine (NORVASC) 2.5 MG tablet Take 1 tablet (2.5 mg total) by mouth daily. 90 tablet 4  . atorvastatin (LIPITOR) 40 MG tablet TAKE 1 TABLET BY MOUTH AT BEDTIME FOR CHOLESTEROL 90 tablet 1  . b complex vitamins capsule Take 1 capsule by mouth daily. AM    . Calcium Carbonate-Vitamin D3 (CALCIUM 600/VITAMIN D) 600-400 MG-UNIT TABS Take by mouth daily.    . carvedilol (COREG) 3.125 MG tablet TAKE ONE TABLET BY MOUTH ONCE DAILY IN AM 90 tablet 3  . Cinnamon 500 MG capsule Take 500 mg by mouth 2 (two) times daily. AM AND BEDTIME    . finasteride (PROSCAR) 5 MG tablet TAKE ONE TABLET BY MOUTH ONCE DAILY 90 tablet 3  . loratadine (CLARITIN) 10 MG tablet Take 10 mg by mouth daily. AM    . losartan (COZAAR) 100 MG tablet TAKE ONE TABLET BY MOUTH ONCE DAILY 90 tablet 3  . Multiple Vitamins-Minerals (MULTIVITAMIN WITH MINERALS) tablet Take 1 tablet by mouth daily. AM    . polycarbophil (FIBERCON) 625 MG tablet Take 625 mg by mouth daily. AM    . tamsulosin (FLOMAX) 0.4 MG CAPS capsule TAKE ONE CAPSULE BY MOUTH ONCE DAILY 90 capsule 3  . vitamin C (ASCORBIC ACID) 500 MG tablet Take 500 mg by mouth daily. AM     No current facility-administered medications for this visit.      Allergies:   Latex and Tape    Social History:  The patient  reports that he quit smoking about 18 years ago. His smoking use included cigarettes. He has a 40.00 pack-year smoking history. he has never used smokeless tobacco. He reports that he does not drink alcohol or use drugs.   Family History:   family history includes Alzheimer's disease in his mother; Cancer in his sister; Diabetes in his brother; Hyperlipidemia in his brother and sister; Hypertension in his brother and sister; Stroke in his mother.    Review of Systems: Review of Systems  Constitutional: Positive for malaise/fatigue.  Respiratory: Negative.   Cardiovascular: Negative.   Gastrointestinal: Negative.   Musculoskeletal: Negative.   Neurological: Negative.   Psychiatric/Behavioral: Negative.   All other systems reviewed and are negative.    PHYSICAL EXAM: VS:  BP 130/70 (BP Location: Left Arm, Patient Position: Sitting, Cuff Size: Normal)   Pulse (!) 48   Ht '5\' 4"'$  (1.626 m)   Wt 156 lb 8 oz (71 kg)   BMI 26.86 kg/m  , BMI Body mass index is 26.86 kg/m. GEN: Well nourished, well developed, in no acute distress  HEENT: normal  Neck: no JVD,  carotid bruits, or masses Cardiac: RRR; no murmurs, rubs, or gallops,no edema  Respiratory:  clear to auscultation bilaterally, normal work of breathing GI: soft, nontender, nondistended, + BS MS: no deformity or atrophy  Skin: warm and dry, no rash Neuro:  Strength and sensation are intact Psych: euthymic mood, full affect    Recent Labs: 04/06/2017: ALT 19; BUN 23; Creatinine, Ser 0.91; Hemoglobin 15.2; Platelets 177; Potassium 4.9; Sodium 140    Lipid Panel Lab Results  Component Value Date   CHOL 133 02/14/2017   HDL 39 (L) 02/14/2017   LDLCALC 70 02/14/2017   TRIG 118 02/14/2017      Wt Readings from Last 3 Encounters:  04/08/17 156 lb 8 oz (71 kg)  02/15/17 156 lb (70.8 kg)  02/14/17 156 lb 3.2 oz (70.9 kg)       ASSESSMENT AND PLAN:  Essential hypertension - Plan: EKG  12-Lead Blood pressure is well controlled on today's visit. No changes made to the medications. Stable  Paroxysmal atrial flutter ([place]) - Plan: EKG 12-Lead Long discussion concerning his arrhythmia Suggested he could stop the carvedilol,  He prefers to stay on this for now and consider stopping it after all his cancer treatments are complete  Likely chronic bradycardia  Mixed hyperlipidemia Cholesterol is at goal on the current lipid regimen. No changes to the medications were made. Stable  Sinus bradycardia Takes carvedilol once a day, suggested he could try to stop this he prefers to stay on for now and will consider stopping at a later date,  Smoking history Stopped 2001  S/P lobectomy of lung Metastasis to liver  Coronary artery disease involving native heart without angina pectoris, unspecified vessel or lesion type CT scan heart chest minimal coronary calcifications noted  Aortic atherosclerosis (HCC) Mild diffuse aortic atherosclerosis noted   Total encounter time more than 25 minutes  Greater than 50% was spent in counseling and coordination of care with the patient   Disposition:   F/U  12 months   Orders Placed This Encounter  Procedures  . EKG 12-Lead     Signed, Esmond Plants, M.D., Ph.D. 04/08/2017  [place], [place]

## 2017-04-08 ENCOUNTER — Encounter: Payer: Self-pay | Admitting: Cardiovascular Disease

## 2017-04-08 ENCOUNTER — Ambulatory Visit: Payer: Medicare Other | Admitting: Cardiovascular Disease

## 2017-04-08 VITALS — BP 130/70 | HR 48 | Ht 64.0 in | Wt 156.5 lb

## 2017-04-08 DIAGNOSIS — E782 Mixed hyperlipidemia: Secondary | ICD-10-CM

## 2017-04-08 DIAGNOSIS — R001 Bradycardia, unspecified: Secondary | ICD-10-CM | POA: Diagnosis not present

## 2017-04-08 DIAGNOSIS — I700 Atherosclerosis of aorta: Secondary | ICD-10-CM

## 2017-04-08 DIAGNOSIS — I10 Essential (primary) hypertension: Secondary | ICD-10-CM

## 2017-04-08 DIAGNOSIS — I4892 Unspecified atrial flutter: Secondary | ICD-10-CM

## 2017-04-08 NOTE — Patient Instructions (Signed)

## 2017-04-10 ENCOUNTER — Other Ambulatory Visit: Payer: Self-pay | Admitting: Cardiovascular Disease

## 2017-04-26 ENCOUNTER — Other Ambulatory Visit: Payer: Self-pay | Admitting: Family Medicine

## 2017-05-03 NOTE — Progress Notes (Signed)
[facility]  [phone] [phone]  ID: Danne Harbor OB: April 30, 1941  MR#: [id number]  YWV#:[id number]  Patient Care Team: Arnetha Courser, MD as PCP - General (Family Medicine) [name], Forest Gleason, MD (General Surgery) Nestor Lewandowsky, MD as Referring Physician (Cardiothoracic Surgery) Minna Merritts, MD as Consulting Physician (Cardiology) [name], MD as Consulting Physician (Cardiothoracic Surgery) Lloyd Huger, MD as Consulting Physician (Oncology) Sanda Klein Satira Anis, MD as Attending Physician (Family Medicine)  CHIEF COMPLAINT: 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  INTERVAL HISTORY: Patient returns to clinic today for further evaluation and continuation of Lanreotide. He states he has diarrhea for 3-4 days after receiving his injections, but otherwise is tolerating them well.  He currently feels well and is asymptomatic. He has no neurologic complaints. He denies any recent fevers or illnesses. He has a good appetite and denies weight loss. He denies any chest pain, shortness of breath, hemoptysis, or cough. He denies any abdominal pain. He has no nausea, vomiting, or constipation. He has no urinary complaints. Patient offers no specific complaints today.  REVIEW OF SYSTEMS:   Review of Systems  Constitutional: Negative.  Negative for fever, malaise/fatigue and weight loss.  Respiratory: Negative.  Negative for cough, hemoptysis and shortness of breath.   Cardiovascular: Negative.  Negative for chest pain and leg swelling.  Gastrointestinal: Positive for diarrhea. Negative for abdominal pain.  Genitourinary: Negative.   Musculoskeletal: Negative.   Skin: Negative.  Negative for rash.  Neurological: Negative.  Negative for weakness.  Psychiatric/Behavioral: Negative.  The patient is not nervous/anxious.     As per HPI. Otherwise, a complete review of systems is negative.  PAST MEDICAL  HISTORY: Past Medical History:  Diagnosis Date  . Allergic rhinitis   . Benign prostatic hypertrophy with lower urinary tract symptoms (LUTS)   . Chest pain    a. 05/2013 MV: EF 70%, no ischemia.  . Diverticulitis    DIVERTICULOSIS  . Enlarged prostate   . Essential hypertension    CONTROLLED ON MEDS  . Full dentures    upper and lower  . H/O hypokalemia   . Hx of atrial fibrillation without current medication    CARDIOLOGIST-DR [facility]  . 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 Hyperlipemia   . Hypomagnesemia   . IFG (impaired fasting glucose)   . Liver cancer ([place])   . Liver cancer ([place]) 12/08/2016  . Liver mass, left lobe 08/28/2016   Probably hemangioma; will get MRI of liver, refer to GI  . Lung cancer ([place])    a. carcinoid, left lung, Stage 1b (T2a, N0, cM0);  b. 05/2013 s/p VATS & LULobectomy.  . Monocytosis   . Osteopenia   . Paroxysmal atrial flutter (HCC)    a. 03/2013->no recurrence;  b. CHA2DS2VASc = 2-->not currently on anticoagulation;  c. 05/2012 Echo: EF 55-60%, normal RV.  [name] Wears glasses     PAST SURGICAL HISTORY: Past Surgical History:  Procedure Laterality Date  . BRONCHOSCOPY     04/06/2013  . CARDIOVASCULAR STRESS TEST  05/2013   a. No evidence of ischemia or infarct, EF 70%, no WMAs  . CATARACT EXTRACTION W/PHACO Left 07/14/2015   Procedure: CATARACT EXTRACTION PHACO AND INTRAOCULAR LENS PLACEMENT (IOC);  Surgeon: [name], MD;  Location: [place];  Service: Ophthalmology;  Laterality: Left;  . CATARACT EXTRACTION W/PHACO Right 10/01/2015   Procedure: CATARACT EXTRACTION PHACO AND INTRAOCULAR LENS  PLACEMENT (IOC);  Surgeon: [name], MD;  Location: [place];  Service: Ophthalmology;  Laterality: Right;  . COLONOSCOPY W/ POLYPECTOMY     bleed after-had to go to surgery to stop bleeding via colonoscopy  . COLONOSCOPY WITH PROPOFOL N/A 11/19/2015   Procedure: COLONOSCOPY WITH  PROPOFOL;  Surgeon: [name], MD;  Location: [facility] ENDOSCOPY;  Service: Endoscopy;  Laterality: N/A;  . DUPUYTREN CONTRACTURE RELEASE  12/15/2011   Procedure: DUPUYTREN CONTRACTURE RELEASE;  Surgeon: [name], MD;  Location: [place];  Service: Orthopedics;  Laterality: Left;  Fasciotomy left ring finger dupuytrens  . LUNG LOBECTOMY  06/10/13   upper left lung  . MULTIPLE TOOTH EXTRACTIONS    . REMOVAL RETAINED LENS Right 11/17/2015   Procedure: REMOVAL RETAINED LENS FROAGMENTS RIGHT EYE;  Surgeon: [name], MD;  Location: [place];  Service: Ophthalmology;  Laterality: Right;  . TONSILLECTOMY    . UPPER GASTROINTESTINAL ENDOSCOPY  11-03-15   Dr [name]  . VASECTOMY    . VIDEO ASSISTED THORACOSCOPY (VATS)/WEDGE RESECTION Left 06/04/2013   Procedure: VIDEO ASSISTED THORACOSCOPY (VATS)/WEDGE RESECTION;  Surgeon: [name], MD;  Location: [place];  Service: Thoracic;  Laterality: Left;  [name] VIDEO BRONCHOSCOPY N/A 06/04/2013   Procedure: VIDEO BRONCHOSCOPY;  Surgeon: [name], MD;  Location: [facility] OR;  Service: Thoracic;  Laterality: N/A;    FAMILY HISTORY: Family History  Problem Relation Age of Onset  . Stroke Mother   . Alzheimer's disease Mother   . Hypertension Sister   . Hyperlipidemia Sister   . Hypertension Brother   . Hyperlipidemia Brother   . Diabetes Brother   . Cancer Sister        lung and colon    ADVANCED DIRECTIVES (Y/N):  N  HEALTH MAINTENANCE: Social History   Tobacco Use  . Smoking status: Former Smoker    Packs/day: 1.00    Years: 40.00    Pack years: 40.00    Types: Cigarettes    Last attempt to quit: 12/09/1998    Years since quitting: 18.4  . Smokeless tobacco: Never Used  Substance Use Topics  . Alcohol use: No  . Drug use: No     Colonoscopy:  PAP:  Bone density:  Lipid panel:  Allergies  Allergen Reactions  . Latex Itching    Tape only- REACTED TO BANDAGE ON ABDOMEN  . Tape Itching     Surgical tapes     Current Outpatient Medications  Medication Sig Dispense Refill  . amLODipine (NORVASC) 2.5 MG tablet TAKE ONE TABLET BY MOUTH ONCE DAILY 90 tablet 2  . atorvastatin (LIPITOR) 40 MG tablet TAKE 1 TABLET BY MOUTH AT BEDTIME FOR CHOLESTEROL 90 tablet 1  . b complex vitamins capsule Take 1 capsule by mouth daily. AM    . Calcium Carbonate-Vitamin D3 (CALCIUM 600/VITAMIN D) 600-400 MG-UNIT TABS Take by mouth daily.    . carvedilol (COREG) 3.125 MG tablet TAKE ONE TABLET BY MOUTH ONCE DAILY IN AM 90 tablet 3  . Cinnamon 500 MG capsule Take 500 mg by mouth 2 (two) times daily. AM AND BEDTIME    . finasteride (PROSCAR) 5 MG tablet TAKE ONE TABLET BY MOUTH ONCE DAILY 90 tablet 3  . loratadine (CLARITIN) 10 MG tablet Take 10 mg by mouth daily. AM    . losartan (COZAAR) 100 MG tablet TAKE ONE TABLET BY MOUTH ONCE DAILY 90 tablet 3  . Multiple Vitamins-Minerals (MULTIVITAMIN WITH MINERALS) tablet Take 1 tablet  by mouth daily. AM    . polycarbophil (FIBERCON) 625 MG tablet Take 625 mg by mouth daily. AM    . tamsulosin (FLOMAX) 0.4 MG CAPS capsule TAKE ONE CAPSULE BY MOUTH ONCE DAILY 90 capsule 3  . vitamin C (ASCORBIC ACID) 500 MG tablet Take 500 mg by mouth daily. AM     No current facility-administered medications for this visit.     OBJECTIVE: There were no vitals filed for this visit.   There is no height or weight on file to calculate BMI.    ECOG FS:0 - Asymptomatic  General: Well-developed, well-nourished, no acute distress. Eyes: Pink conjunctiva, anicteric sclera. Lungs: Clear to auscultation bilaterally. Heart: Regular rate and rhythm. No rubs, murmurs, or gallops. Abdomen: Soft, nontender, nondistended. No organomegaly noted, normoactive bowel sounds. Musculoskeletal: No edema, cyanosis, or clubbing. Neuro: Alert, answering all questions appropriately. Cranial nerves grossly intact. Skin: No rashes or petechiae noted. Psych: Normal affect.   LAB RESULTS:  Lab  Results  Component Value Date   NA 140 04/06/2017   K 4.9 04/06/2017   CL 105 04/06/2017   CO2 28 04/06/2017   GLUCOSE 114 (H) 04/06/2017   BUN 23 (H) 04/06/2017   CREATININE 0.91 04/06/2017   CALCIUM 9.9 04/06/2017   PROT 7.3 04/06/2017   ALBUMIN 4.3 04/06/2017   AST 22 04/06/2017   ALT 19 04/06/2017   ALKPHOS 106 04/06/2017   BILITOT 1.1 04/06/2017   GFRNONAA >60 04/06/2017   GFRAA >60 04/06/2017    Lab Results  Component Value Date   WBC 7.7 04/06/2017   NEUTROABS 5.3 04/06/2017   HGB 15.2 04/06/2017   HCT 45.3 04/06/2017   MCV 90.2 04/06/2017   PLT 177 04/06/2017     STUDIES: No results found.  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  PLAN:    1. 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 Patient underwent left upper lobe lobectomy in [place] on June 04, 2013. Final pathology results revealed a well differentiated neuroendocrine tumor with positive bronchial margins. Patient did not require adjuvant XRT or chemotherapy at that time. Recent imaging and biopsy completed on December 20, 2016 revealed a recurrence of patient's known neuroendocrine disease in his liver. Patient's chromogranin A levels are actually unchanged and mildly elevated at 7, today's result is pending.   Proceed with an 120 mg subcutaneous Lanreotide every 4 weeks.  We discussed at length possible ablation with interventional radiology, but patient wishes to defer this decision until a repeat MRI of his liver is completed in February 2019.  Return to clinic in 4 weeks for Lanreotide only and then in February 2019 with repeat CT scan of chest, repeat MRI liver, and further evaluation. 2.  Diarrhea: Unusual with Lanteotide, monitor.  Continue OTC Imodium as needed.  Approximately 30 minutes was spent in discussion of which greater than 50% was  consultation.  Patient expressed understanding and was in agreement with this plan. He also understands that He can call clinic at any time with any questions, concerns, or complaints.   Cancer Staging Neuroendocrine carcinoma of lung [facility]) Staging form: Lung, AJCC 8th Edition - Clinical stage from 12/28/2016: Stage IVA (cT2a, cN0, cM1b) - Signed by Lloyd Huger, MD on 12/28/2016   Lloyd Huger, MD   05/03/2017 11:23 PM

## 2017-05-05 ENCOUNTER — Inpatient Hospital Stay: Payer: Medicare Other

## 2017-05-05 ENCOUNTER — Inpatient Hospital Stay: Payer: Medicare Other | Attending: Oncology | Admitting: Oncology

## 2017-05-05 VITALS — BP 135/75 | HR 60 | Temp 97.2°F | Resp 18 | Wt 161.5 lb

## 2017-05-05 DIAGNOSIS — Z87891 Personal history of nicotine dependence: Secondary | ICD-10-CM | POA: Insufficient documentation

## 2017-05-05 DIAGNOSIS — C7800 Secondary malignant neoplasm of unspecified lung: Secondary | ICD-10-CM | POA: Diagnosis not present

## 2017-05-05 DIAGNOSIS — E785 Hyperlipidemia, unspecified: Secondary | ICD-10-CM | POA: Insufficient documentation

## 2017-05-05 DIAGNOSIS — R785 Finding of other psychotropic drug in blood: Secondary | ICD-10-CM | POA: Insufficient documentation

## 2017-05-05 DIAGNOSIS — C7A8 Other malignant neuroendocrine tumors: Secondary | ICD-10-CM

## 2017-05-05 DIAGNOSIS — I4891 Unspecified atrial fibrillation: Secondary | ICD-10-CM | POA: Insufficient documentation

## 2017-05-05 DIAGNOSIS — N400 Enlarged prostate without lower urinary tract symptoms: Secondary | ICD-10-CM | POA: Diagnosis not present

## 2017-05-05 DIAGNOSIS — C787 Secondary malignant neoplasm of liver and intrahepatic bile duct: Secondary | ICD-10-CM | POA: Diagnosis not present

## 2017-05-05 DIAGNOSIS — I10 Essential (primary) hypertension: Secondary | ICD-10-CM | POA: Insufficient documentation

## 2017-05-05 DIAGNOSIS — Z79899 Other long term (current) drug therapy: Secondary | ICD-10-CM

## 2017-05-05 DIAGNOSIS — C7B8 Other secondary neuroendocrine tumors: Principal | ICD-10-CM

## 2017-05-05 DIAGNOSIS — M8580 Other specified disorders of bone density and structure, unspecified site: Secondary | ICD-10-CM | POA: Insufficient documentation

## 2017-05-05 LAB — CBC WITH DIFFERENTIAL/PLATELET
BASOS PCT: 1 %
Basophils Absolute: 0.1 10*3/uL (ref 0–0.1)
EOS ABS: 0.3 10*3/uL (ref 0–0.7)
Eosinophils Relative: 3 %
HCT: 46.5 % (ref 40.0–52.0)
HEMOGLOBIN: 15.6 g/dL (ref 13.0–18.0)
Lymphocytes Relative: 11 %
Lymphs Abs: 1.1 10*3/uL (ref 1.0–3.6)
MCH: 30.5 pg (ref 26.0–34.0)
MCHC: 33.5 g/dL (ref 32.0–36.0)
MCV: 90.9 fL (ref 80.0–100.0)
MONO ABS: 1.3 10*3/uL — AB (ref 0.2–1.0)
MONOS PCT: 14 %
NEUTROS PCT: 71 %
Neutro Abs: 7.1 10*3/uL — ABNORMAL HIGH (ref 1.4–6.5)
Platelets: 190 10*3/uL (ref 150–440)
RBC: 5.12 MIL/uL (ref 4.40–5.90)
RDW: 14.9 % — AB (ref 11.5–14.5)
WBC: 9.9 10*3/uL (ref 3.8–10.6)

## 2017-05-05 LAB — COMPREHENSIVE METABOLIC PANEL
ALK PHOS: 115 U/L (ref 38–126)
ALT: 20 U/L (ref 17–63)
AST: 23 U/L (ref 15–41)
Albumin: 4.3 g/dL (ref 3.5–5.0)
Anion gap: 7 (ref 5–15)
BILIRUBIN TOTAL: 0.9 mg/dL (ref 0.3–1.2)
BUN: 19 mg/dL (ref 6–20)
CALCIUM: 9.7 mg/dL (ref 8.9–10.3)
CO2: 30 mmol/L (ref 22–32)
CREATININE: 1.01 mg/dL (ref 0.61–1.24)
Chloride: 104 mmol/L (ref 101–111)
GFR calc Af Amer: >60 mL/min (ref >60)
Glucose, Bld: 96 mg/dL (ref 65–99)
Potassium: 4.9 mmol/L (ref 3.5–5.1)
SODIUM: 141 mmol/L (ref 135–145)
TOTAL PROTEIN: 7.4 g/dL (ref 6.5–8.1)

## 2017-05-05 MED ORDER — LANREOTIDE ACETATE 120 MG/0.5ML ~~LOC~~ SOLN
120.0000 mg | Freq: Once | SUBCUTANEOUS | Status: AC
  Administered 2017-05-05: 120 mg via SUBCUTANEOUS
  Filled 2017-05-05: qty 120

## 2017-05-06 LAB — CHROMOGRANIN A: Chromogranin A: 11 nmol/L — ABNORMAL HIGH (ref 0–5)

## 2017-06-02 ENCOUNTER — Inpatient Hospital Stay: Payer: Medicare Other | Attending: Oncology

## 2017-06-02 VITALS — BP 161/81 | HR 55 | Temp 97.0°F | Resp 20

## 2017-06-02 DIAGNOSIS — C7800 Secondary malignant neoplasm of unspecified lung: Secondary | ICD-10-CM | POA: Diagnosis not present

## 2017-06-02 DIAGNOSIS — I4891 Unspecified atrial fibrillation: Secondary | ICD-10-CM | POA: Insufficient documentation

## 2017-06-02 DIAGNOSIS — Z87891 Personal history of nicotine dependence: Secondary | ICD-10-CM | POA: Diagnosis not present

## 2017-06-02 DIAGNOSIS — C787 Secondary malignant neoplasm of liver and intrahepatic bile duct: Secondary | ICD-10-CM | POA: Insufficient documentation

## 2017-06-02 DIAGNOSIS — E785 Hyperlipidemia, unspecified: Secondary | ICD-10-CM | POA: Diagnosis not present

## 2017-06-02 DIAGNOSIS — M8580 Other specified disorders of bone density and structure, unspecified site: Secondary | ICD-10-CM | POA: Diagnosis not present

## 2017-06-02 DIAGNOSIS — Z79899 Other long term (current) drug therapy: Secondary | ICD-10-CM | POA: Diagnosis not present

## 2017-06-02 DIAGNOSIS — N400 Enlarged prostate without lower urinary tract symptoms: Secondary | ICD-10-CM | POA: Insufficient documentation

## 2017-06-02 DIAGNOSIS — I10 Essential (primary) hypertension: Secondary | ICD-10-CM | POA: Diagnosis not present

## 2017-06-02 DIAGNOSIS — R785 Finding of other psychotropic drug in blood: Secondary | ICD-10-CM | POA: Diagnosis not present

## 2017-06-02 DIAGNOSIS — C7A8 Other malignant neuroendocrine tumors: Secondary | ICD-10-CM | POA: Diagnosis not present

## 2017-06-02 MED ORDER — LANREOTIDE ACETATE 120 MG/0.5ML ~~LOC~~ SOLN
120.0000 mg | Freq: Once | SUBCUTANEOUS | Status: AC
  Administered 2017-06-02: 120 mg via SUBCUTANEOUS
  Filled 2017-06-02: qty 120

## 2017-06-26 NOTE — Progress Notes (Signed)
Jason Malone  [phone]) [phone] Fax:(336) [phone]  ID: Danne Harbor OB: 02-20-1941  MR#: [id number]  LGX#:[id number]  Patient Care Team: Arnetha Courser, MD as PCP - General (Family Medicine) [name], Forest Gleason, MD (General Surgery) Nestor Lewandowsky, MD as Referring Physician (Cardiothoracic Surgery) Minna Merritts, MD as Consulting Physician (Cardiology) [name], MD as Consulting Physician (Cardiothoracic Surgery) Lloyd Huger, MD as Consulting Physician (Oncology) Sanda Klein Satira Anis, MD as Attending Physician (Family Medicine)  CHIEF COMPLAINT: 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  INTERVAL HISTORY: Patient returns to clinic today for further evaluation, discussion of his imaging results, and continuation of Lanreotide. He states his injections alter the taste and smell of food, but he is still able to eat.  He does not complain of diarrhea today.  He has occasional dizziness. He has no other neurologic complaints. He denies any recent fevers or illnesses. He has a fair appetite and denies weight loss. He denies any chest pain, shortness of breath, hemoptysis, or cough. He denies any abdominal pain. He has no nausea, vomiting, or constipation. He has no urinary complaints. Patient offers no further specific complaints today.  REVIEW OF SYSTEMS:   Review of Systems  Constitutional: Negative.  Negative for fever, malaise/fatigue and weight loss.  Respiratory: Negative.  Negative for cough, hemoptysis and shortness of breath.   Cardiovascular: Negative.  Negative for chest pain and leg swelling.  Gastrointestinal: Negative.  Negative for abdominal pain and diarrhea.  Genitourinary: Negative.   Musculoskeletal: Negative.   Skin: Negative.  Negative for rash.  Neurological: Positive for dizziness. Negative for weakness.  Psychiatric/Behavioral: Negative.  The patient is not nervous/anxious.     As per HPI. Otherwise,  a complete review of systems is negative.  PAST MEDICAL HISTORY: Past Medical History:  Diagnosis Date  . Allergic rhinitis   . Benign prostatic hypertrophy with lower urinary tract symptoms (LUTS)   . Chest pain    a. 05/2013 MV: EF 70%, no ischemia.  . Diverticulitis    DIVERTICULOSIS  . Enlarged prostate   . Essential hypertension    CONTROLLED ON MEDS  . Full dentures    upper and lower  . H/O hypokalemia   . Hx of atrial fibrillation without current medication    CARDIOLOGIST-DR [facility]  . 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 Hyperlipemia   . Hypomagnesemia   . IFG (impaired fasting glucose)   . Liver cancer ([place])   . Liver cancer ([place]) 12/08/2016  . Liver mass, left lobe 08/28/2016   Probably hemangioma; will get MRI of liver, refer to GI  . Lung cancer ([place])    a. carcinoid, left lung, Stage 1b (T2a, N0, cM0);  b. 05/2013 s/p VATS & LULobectomy.  . Monocytosis   . Osteopenia   . Paroxysmal atrial flutter ([facility])    a. 03/2013->no recurrence;  b. CHA2DS2VASc = 2-->not currently on anticoagulation;  c. 05/2012 Echo: EF 55-60%, normal RV.  [name] Wears glasses     PAST SURGICAL HISTORY: Past Surgical History:  Procedure Laterality Date  . BRONCHOSCOPY     04/06/2013  . CARDIOVASCULAR STRESS TEST  05/2013   a. No evidence of ischemia or infarct, EF 70%, no WMAs  . CATARACT EXTRACTION W/PHACO Left 07/14/2015   Procedure: CATARACT EXTRACTION PHACO AND INTRAOCULAR LENS PLACEMENT (IOC);  Surgeon: [name], MD;  Location: [place];  Service: Ophthalmology;  Laterality: Left;  .  CATARACT EXTRACTION W/PHACO Right 10/01/2015   Procedure: CATARACT EXTRACTION PHACO AND INTRAOCULAR LENS PLACEMENT (IOC);  Surgeon: [name], MD;  Location: [place];  Service: Ophthalmology;  Laterality: Right;  . COLONOSCOPY W/ POLYPECTOMY     bleed after-had to go to surgery to stop bleeding via colonoscopy  . COLONOSCOPY WITH  PROPOFOL N/A 11/19/2015   Procedure: COLONOSCOPY WITH PROPOFOL;  Surgeon: [name], MD;  Location: [facility] ENDOSCOPY;  Service: Endoscopy;  Laterality: N/A;  . DUPUYTREN CONTRACTURE RELEASE  12/15/2011   Procedure: DUPUYTREN CONTRACTURE RELEASE;  Surgeon: [name], MD;  Location: [place];  Service: Orthopedics;  Laterality: Left;  Fasciotomy left ring finger dupuytrens  . LUNG LOBECTOMY  06/10/13   upper left lung  . MULTIPLE TOOTH EXTRACTIONS    . REMOVAL RETAINED LENS Right 11/17/2015   Procedure: REMOVAL RETAINED LENS FROAGMENTS RIGHT EYE;  Surgeon: [name], MD;  Location: [place];  Service: Ophthalmology;  Laterality: Right;  . TONSILLECTOMY    . UPPER GASTROINTESTINAL ENDOSCOPY  11-03-15   Dr [name]  . VASECTOMY    . VIDEO ASSISTED THORACOSCOPY (VATS)/WEDGE RESECTION Left 06/04/2013   Procedure: VIDEO ASSISTED THORACOSCOPY (VATS)/WEDGE RESECTION;  Surgeon: [name], MD;  Location: [place];  Service: Thoracic;  Laterality: Left;  [name] VIDEO BRONCHOSCOPY N/A 06/04/2013   Procedure: VIDEO BRONCHOSCOPY;  Surgeon: [name], MD;  Location[facility] OR;  Service: Thoracic;  Laterality: N/A;    FAMILY HISTORY: Family History  Problem Relation Age of Onset  . Stroke Mother   . Alzheimer's disease Mother   . Hypertension Sister   . Hyperlipidemia Sister   . Hypertension Brother   . Hyperlipidemia Brother   . Diabetes Brother   . Cancer Sister        lung and colon    ADVANCED DIRECTIVES (Y/N):  N  HEALTH MAINTENANCE: Social History   Tobacco Use  . Smoking status: Former Smoker    Packs/day: 1.00    Years: 40.00    Pack years: 40.00    Types: Cigarettes    Last attempt to quit: 12/09/1998    Years since quitting: 18.5  . Smokeless tobacco: Never Used  Substance Use Topics  . Alcohol use: No  . Drug use: No     Colonoscopy:  PAP:  Bone density:  Lipid panel:  Allergies  Allergen Reactions  . Latex Itching    Tape  only- REACTED TO BANDAGE ON ABDOMEN  . Tape Itching    Surgical tapes     Current Outpatient Medications  Medication Sig Dispense Refill  . amLODipine (NORVASC) 2.5 MG tablet TAKE ONE TABLET BY MOUTH ONCE DAILY 90 tablet 2  . atorvastatin (LIPITOR) 40 MG tablet TAKE 1 TABLET BY MOUTH AT BEDTIME FOR CHOLESTEROL 90 tablet 1  . b complex vitamins capsule Take 1 capsule by mouth daily. AM    . Calcium Carbonate-Vitamin D3 (CALCIUM 600/VITAMIN D) 600-400 MG-UNIT TABS Take by mouth daily.    . carvedilol (COREG) 3.125 MG tablet TAKE ONE TABLET BY MOUTH ONCE DAILY IN AM 90 tablet 3  . Cinnamon 500 MG capsule Take 500 mg by mouth 2 (two) times daily. AM AND BEDTIME    . finasteride (PROSCAR) 5 MG tablet TAKE ONE TABLET BY MOUTH ONCE DAILY 90 tablet 3  . loratadine (CLARITIN) 10 MG tablet Take 10 mg by mouth daily. AM    . losartan (COZAAR) 100 MG tablet TAKE ONE TABLET BY MOUTH ONCE DAILY  90 tablet 3  . Multiple Vitamins-Minerals (MULTIVITAMIN WITH MINERALS) tablet Take 1 tablet by mouth daily. AM    . polycarbophil (FIBERCON) 625 MG tablet Take 625 mg by mouth daily. AM    . polyethylene glycol (MIRALAX / GLYCOLAX) packet Take 17 g by mouth daily.    . tamsulosin (FLOMAX) 0.4 MG CAPS capsule TAKE ONE CAPSULE BY MOUTH ONCE DAILY 90 capsule 3  . vitamin C (ASCORBIC ACID) 500 MG tablet Take 500 mg by mouth daily. AM     No current facility-administered medications for this visit.     OBJECTIVE: Vitals:   06/30/17 1428  BP: 136/74  Pulse: (!) 58  Resp: 18  Temp: (!) 96.7 F (35.9 C)     Body mass index is 27.91 kg/m.    ECOG FS:0 - Asymptomatic  General: Well-developed, well-nourished, no acute distress. Eyes: Pink conjunctiva, anicteric sclera. Lungs: Clear to auscultation bilaterally. Heart: Regular rate and rhythm. No rubs, murmurs, or gallops. Abdomen: Soft, nontender, nondistended. No organomegaly noted, normoactive bowel sounds. Musculoskeletal: No edema, cyanosis, or  clubbing. Neuro: Alert, answering all questions appropriately. Cranial nerves grossly intact. Skin: No rashes or petechiae noted. Psych: Normal affect.   LAB RESULTS:  Lab Results  Component Value Date   NA 139 06/30/2017   K 4.7 06/30/2017   CL 105 06/30/2017   CO2 29 06/30/2017   GLUCOSE 96 06/30/2017   BUN 17 06/30/2017   CREATININE 1.23 06/30/2017   CALCIUM 9.4 06/30/2017   PROT 6.9 06/30/2017   ALBUMIN 4.1 06/30/2017   AST 21 06/30/2017   ALT 18 06/30/2017   ALKPHOS 98 06/30/2017   BILITOT 0.7 06/30/2017   GFRNONAA 56 (L) 06/30/2017   GFRAA >60 06/30/2017    Lab Results  Component Value Date   WBC 9.4 06/30/2017   NEUTROABS 6.7 (H) 06/30/2017   HGB 15.0 06/30/2017   HCT 45.0 06/30/2017   MCV 91.0 06/30/2017   PLT 186 06/30/2017     STUDIES: Ct Chest W Contrast  Result Date: 06/27/2017 CLINICAL DATA:  77 year old male with history of lung cancer status post left upper lobectomy. Follow-up study. EXAM: CT CHEST WITH CONTRAST TECHNIQUE: Multidetector CT imaging of the chest was performed during intravenous contrast administration. CONTRAST:  5mL ISOVUE-300 IOPAMIDOL (ISOVUE-300) INJECTION 61% COMPARISON:  Multiple priors, most recently chest CT 06/25/2016. FINDINGS: Cardiovascular: Heart size is normal. There is no significant pericardial fluid, thickening or pericardial calcification. There is aortic atherosclerosis, as well as atherosclerosis of the great vessels of the mediastinum and the coronary arteries, including calcified atherosclerotic plaque in the left main coronary arteries. Mediastinum/Nodes: No pathologically enlarged mediastinal or hilar lymph nodes. Esophagus is unremarkable in appearance. No axillary lymphadenopathy. Lungs/Pleura: Status post left upper lobectomy. Compensatory hyperexpansion of the left lower lobe. No suspicious appearing pulmonary nodules or masses are noted. Calcified granuloma in the posterior aspect of the right upper lobe  incidentally noted. Mild diffuse bronchial wall thickening with mild centrilobular and paraseptal emphysema. No acute consolidative airspace disease. No pleural effusions. Upper Abdomen: Small partially calcified gallstones lying dependently in the gallbladder measuring up to 6 mm. Aortic atherosclerosis. Musculoskeletal: There are no aggressive appearing lytic or blastic lesions noted in the visualized portions of the skeleton. IMPRESSION: 1. Status post left upper lobectomy. No findings to suggest local recurrence or metastatic disease in the thorax on today's examination. 2. Aortic atherosclerosis, in addition to left main coronary artery disease. Assessment for potential risk factor modification, dietary therapy or pharmacologic therapy may be  warranted, if clinically indicated. 3. Mild diffuse bronchial wall thickening with mild centrilobular and paraseptal emphysema; imaging findings suggestive of underlying COPD. 4. Cholelithiasis. Aortic Atherosclerosis (ICD10-I70.0) and Emphysema (ICD10-J43.9). Electronically Signed   By: Vinnie Langton M.D.   On: 06/27/2017 15:37   Mr Liver W Wo Contrast  Result Date: 06/27/2017 CLINICAL DATA:  Metastatic neuroendocrine tumor of the left upper lobe. EXAM: MRI ABDOMEN WITHOUT AND WITH CONTRAST TECHNIQUE: Multiplanar multisequence MR imaging of the abdomen was performed both before and after the administration of intravenous contrast. CONTRAST:  15 cc MultiHance COMPARISON:  MRI 12/03/2016 and 09/07/2016 FINDINGS: Examination is limited due to inability to hold his breath. Lower chest: The lung bases are grossly clear. Hepatobiliary: Interval significant enlargement of the segment 3 liver lesion. It measures 4.3 x 3.9 cm and previously measured 3.3 x 2.3 cm. Although the study is limited there appears to be a new lesion in the lateral aspect of segment 8 measuring approximately 13.5 mm. I suspect there is also a new lesion in segment 6 measuring 8 mm. Pancreas:  No  mass, inflammation or ductal dilatation. Spleen:  Stable 15 mm lesion, likely benign hemangioma. Adrenals/Urinary Tract: The adrenal glands and kidneys are grossly normal and stable. The left kidney demonstrates renal cortical thinning and atrophy. Stomach/Bowel: Visualized portions within the abdomen are unremarkable. Vascular/Lymphatic: No pathologically enlarged lymph nodes identified. No abdominal aortic aneurysm demonstrated. Other:  No ascites or abdominal wall hernia. Musculoskeletal: No significant bony findings. IMPRESSION: 1. Enlarging segment 3 liver lesion now measuring 4.3 x 3.9 cm and previously measuring 3.3 x 2.3 cm. Findings consistent with metastatic neuroendocrine tumor. 2. Suspect new lesions in segment 8 and segment 6. PET CT with Dotatate may be helpful for for further evaluation, if clinically necessary. 3. No abdominal lymphadenopathy. 4. Stable benign-appearing splenic lesion, likely hemangioma. Electronically Signed   By: Marijo Sanes M.D.   On: 06/27/2017 15:12    ASSESSMENT: 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  PLAN:    1. 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 Patient underwent left upper lobe lobectomy in [place] on June 04, 2013. Final pathology results revealed a well differentiated neuroendocrine tumor with positive bronchial margins. Patient did not require adjuvant XRT or chemotherapy at that time.  MRI results from June 27, 2017 reviewed independently and report as above was continued progression of patient's known disease in his liver.  His chromogranin A levels are also trending up slightly.  He now has agreed to a referral to interventional radiology for evaluation and possible ablation or embolization. Proceed with an 120 mg subcutaneous Lanreotide every 4 weeks.  Return to clinic in 4 weeks for Lanreotide and further  evaluation.   2.  Diarrhea: Patient does not complain of this today.  Approximately 30 minutes was spent in discussion of which greater than 50% was consultation.  Patient expressed understanding and was in agreement with this plan. He also understands that He can call clinic at any time with any questions, concerns, or complaints.   Cancer Staging Neuroendocrine carcinoma of lung [facility]) Staging form: Lung, AJCC 8th Edition - Clinical stage from 12/28/2016: Stage IVA (cT2a, cN0, cM1b) - Signed by Lloyd Huger, MD on 12/28/2016   Lloyd Huger, MD   06/30/2017 5:12 PM

## 2017-06-27 ENCOUNTER — Ambulatory Visit
Admission: RE | Admit: 2017-06-27 | Discharge: 2017-06-27 | Disposition: A | Discharge status: Home / Self Care | Payer: Medicare Other | Source: Ambulatory Visit | Attending: Oncology | Admitting: Oncology

## 2017-06-27 DIAGNOSIS — Z85118 Personal history of other malignant neoplasm of bronchus and lung: Secondary | ICD-10-CM | POA: Insufficient documentation

## 2017-06-27 DIAGNOSIS — C7A8 Other malignant neuroendocrine tumors: Secondary | ICD-10-CM | POA: Insufficient documentation

## 2017-06-27 DIAGNOSIS — Z902 Acquired absence of lung [part of]: Secondary | ICD-10-CM | POA: Diagnosis not present

## 2017-06-27 DIAGNOSIS — K8020 Calculus of gallbladder without cholecystitis without obstruction: Secondary | ICD-10-CM | POA: Diagnosis not present

## 2017-06-27 DIAGNOSIS — I700 Atherosclerosis of aorta: Secondary | ICD-10-CM | POA: Diagnosis not present

## 2017-06-27 DIAGNOSIS — K769 Liver disease, unspecified: Secondary | ICD-10-CM | POA: Insufficient documentation

## 2017-06-27 DIAGNOSIS — C7B8 Other secondary neuroendocrine tumors: Secondary | ICD-10-CM | POA: Diagnosis not present

## 2017-06-27 DIAGNOSIS — J439 Emphysema, unspecified: Secondary | ICD-10-CM | POA: Diagnosis not present

## 2017-06-27 DIAGNOSIS — J432 Centrilobular emphysema: Secondary | ICD-10-CM | POA: Insufficient documentation

## 2017-06-27 DIAGNOSIS — J438 Other emphysema: Secondary | ICD-10-CM | POA: Diagnosis not present

## 2017-06-27 DIAGNOSIS — I2510 Atherosclerotic heart disease of native coronary artery without angina pectoris: Secondary | ICD-10-CM | POA: Diagnosis not present

## 2017-06-27 DIAGNOSIS — D7389 Other diseases of spleen: Secondary | ICD-10-CM | POA: Insufficient documentation

## 2017-06-27 LAB — POCT I-STAT CREATININE: Creatinine, Ser: 0.9 mg/dL (ref 0.61–1.24)

## 2017-06-27 MED ORDER — GADOBENATE DIMEGLUMINE 529 MG/ML IV SOLN
15.0000 mL | Freq: Once | INTRAVENOUS | Status: AC | PRN
  Administered 2017-06-27: 15 mL via INTRAVENOUS

## 2017-06-27 MED ORDER — IOPAMIDOL (ISOVUE-300) INJECTION 61%
75.0000 mL | Freq: Once | INTRAVENOUS | Status: AC | PRN
  Administered 2017-06-27: 75 mL via INTRAVENOUS

## 2017-06-30 ENCOUNTER — Inpatient Hospital Stay (HOSPITAL_BASED_OUTPATIENT_CLINIC_OR_DEPARTMENT_OTHER): Payer: Medicare Other | Admitting: Oncology

## 2017-06-30 ENCOUNTER — Ambulatory Visit: Payer: Medicare Other | Admitting: Oncology

## 2017-06-30 ENCOUNTER — Other Ambulatory Visit: Payer: Self-pay

## 2017-06-30 ENCOUNTER — Other Ambulatory Visit: Payer: Self-pay | Admitting: Oncology

## 2017-06-30 ENCOUNTER — Inpatient Hospital Stay: Payer: Medicare Other | Attending: Oncology

## 2017-06-30 ENCOUNTER — Inpatient Hospital Stay: Payer: Medicare Other

## 2017-06-30 VITALS — BP 136/74 | HR 58 | Temp 96.7°F | Resp 18 | Wt 162.6 lb

## 2017-06-30 DIAGNOSIS — Z902 Acquired absence of lung [part of]: Secondary | ICD-10-CM | POA: Insufficient documentation

## 2017-06-30 DIAGNOSIS — M8580 Other specified disorders of bone density and structure, unspecified site: Secondary | ICD-10-CM | POA: Diagnosis not present

## 2017-06-30 DIAGNOSIS — Z87891 Personal history of nicotine dependence: Secondary | ICD-10-CM | POA: Diagnosis not present

## 2017-06-30 DIAGNOSIS — E785 Hyperlipidemia, unspecified: Secondary | ICD-10-CM | POA: Diagnosis not present

## 2017-06-30 DIAGNOSIS — I4892 Unspecified atrial flutter: Secondary | ICD-10-CM | POA: Insufficient documentation

## 2017-06-30 DIAGNOSIS — Z79899 Other long term (current) drug therapy: Secondary | ICD-10-CM

## 2017-06-30 DIAGNOSIS — C7A8 Other malignant neuroendocrine tumors: Secondary | ICD-10-CM | POA: Insufficient documentation

## 2017-06-30 DIAGNOSIS — C7B8 Other secondary neuroendocrine tumors: Secondary | ICD-10-CM

## 2017-06-30 DIAGNOSIS — C787 Secondary malignant neoplasm of liver and intrahepatic bile duct: Secondary | ICD-10-CM | POA: Insufficient documentation

## 2017-06-30 DIAGNOSIS — I10 Essential (primary) hypertension: Secondary | ICD-10-CM | POA: Insufficient documentation

## 2017-06-30 DIAGNOSIS — I4891 Unspecified atrial fibrillation: Secondary | ICD-10-CM | POA: Insufficient documentation

## 2017-06-30 DIAGNOSIS — C7800 Secondary malignant neoplasm of unspecified lung: Secondary | ICD-10-CM

## 2017-06-30 DIAGNOSIS — N400 Enlarged prostate without lower urinary tract symptoms: Secondary | ICD-10-CM | POA: Insufficient documentation

## 2017-06-30 LAB — COMPREHENSIVE METABOLIC PANEL
ALK PHOS: 98 U/L (ref 38–126)
ALT: 18 U/L (ref 17–63)
ANION GAP: 5 (ref 5–15)
AST: 21 U/L (ref 15–41)
Albumin: 4.1 g/dL (ref 3.5–5.0)
BILIRUBIN TOTAL: 0.7 mg/dL (ref 0.3–1.2)
BUN: 17 mg/dL (ref 6–20)
CALCIUM: 9.4 mg/dL (ref 8.9–10.3)
CO2: 29 mmol/L (ref 22–32)
Chloride: 105 mmol/L (ref 101–111)
Creatinine, Ser: 1.23 mg/dL (ref 0.61–1.24)
GFR calc non Af Amer: 56 mL/min — ABNORMAL LOW (ref >60)
GLUCOSE: 96 mg/dL (ref 65–99)
Potassium: 4.7 mmol/L (ref 3.5–5.1)
Sodium: 139 mmol/L (ref 135–145)
Total Protein: 6.9 g/dL (ref 6.5–8.1)

## 2017-06-30 LAB — CBC WITH DIFFERENTIAL/PLATELET
BASOS ABS: 0.1 10*3/uL (ref 0–0.1)
BASOS PCT: 1 %
Eosinophils Absolute: 0.2 10*3/uL (ref 0–0.7)
Eosinophils Relative: 3 %
HEMATOCRIT: 45 % (ref 40.0–52.0)
Hemoglobin: 15 g/dL (ref 13.0–18.0)
Lymphocytes Relative: 13 %
Lymphs Abs: 1.2 10*3/uL (ref 1.0–3.6)
MCH: 30.4 pg (ref 26.0–34.0)
MCHC: 33.4 g/dL (ref 32.0–36.0)
MCV: 91 fL (ref 80.0–100.0)
MONO ABS: 1.1 10*3/uL — AB (ref 0.2–1.0)
Monocytes Relative: 12 %
NEUTROS ABS: 6.7 10*3/uL — AB (ref 1.4–6.5)
NEUTROS PCT: 71 %
PLATELETS: 186 10*3/uL (ref 150–440)
RBC: 4.95 MIL/uL (ref 4.40–5.90)
RDW: 14 % (ref 11.5–14.5)
WBC: 9.4 10*3/uL (ref 3.8–10.6)

## 2017-06-30 MED ORDER — LANREOTIDE ACETATE 120 MG/0.5ML ~~LOC~~ SOLN
120.0000 mg | Freq: Once | SUBCUTANEOUS | Status: AC
  Administered 2017-06-30: 120 mg via SUBCUTANEOUS
  Filled 2017-06-30: qty 120

## 2017-06-30 NOTE — Progress Notes (Signed)
Here for follow up. Stated intermittent L hip pain- "joint "pain per pt

## 2017-07-04 LAB — CHROMOGRANIN A: Chromogranin A: 10 nmol/L — ABNORMAL HIGH (ref 0–5)

## 2017-07-07 ENCOUNTER — Encounter: Payer: Self-pay | Admitting: *Deleted

## 2017-07-07 ENCOUNTER — Ambulatory Visit
Admission: RE | Admit: 2017-07-07 | Discharge: 2017-07-07 | Disposition: A | Discharge status: Home / Self Care | Payer: Medicare Other | Source: Ambulatory Visit | Attending: Oncology | Admitting: Oncology

## 2017-07-07 ENCOUNTER — Other Ambulatory Visit: Payer: Self-pay | Admitting: Surgery

## 2017-07-07 ENCOUNTER — Other Ambulatory Visit (HOSPITAL_COMMUNITY): Payer: Self-pay | Admitting: Interventional Radiology

## 2017-07-07 DIAGNOSIS — C787 Secondary malignant neoplasm of liver and intrahepatic bile duct: Secondary | ICD-10-CM

## 2017-07-07 DIAGNOSIS — C7A8 Other malignant neuroendocrine tumors: Secondary | ICD-10-CM

## 2017-07-07 DIAGNOSIS — C7B02 Secondary carcinoid tumors of liver: Secondary | ICD-10-CM | POA: Diagnosis not present

## 2017-07-07 HISTORY — PX: IR RADIOLOGIST EVAL & MGMT: IMG5224

## 2017-07-07 NOTE — Consult Note (Signed)
Chief Complaint:  Met neuroendocrine tumor to the liver, enlarging left hepatic mets  Referring Physician(s): Finnegan,Timothy J  History of Present Illness: Jason Malone is a 77 y.o. male with stage 4 met well differentiated neuroendocrine tumor to the liver.  Imaging confirms interval enlargement of the largest left hepatic lesion.  He remains asymptomatic with a good functional status. No significant physical limitations. Stable appetite and weight. No recent fever or illnesses. No current chest pain, shortness of breath, abdominal pain, or dysuria. No signs of intermittent flushing syndrome or diarrhea. Overall he is doing very well.  Past Medical History:  Diagnosis Date  . Allergic rhinitis   . Benign prostatic hypertrophy with lower urinary tract symptoms (LUTS)   . Chest pain    a. 05/2013 MV: EF 70%, no ischemia.  . Diverticulitis    DIVERTICULOSIS  . Enlarged prostate   . Essential hypertension    CONTROLLED ON MEDS  . Full dentures    upper and lower  . H/O hypokalemia   . Hx of atrial fibrillation without current medication    CARDIOLOGIST-DR [facility]  . 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 Hyperlipemia   . Hypomagnesemia   . IFG (impaired fasting glucose)   . Liver cancer ([place])   . Liver cancer ([place]) 12/08/2016  . Liver mass, left lobe 08/28/2016   Probably hemangioma; will get MRI of liver, refer to GI  . Lung cancer ([place])    a. carcinoid, left lung, Stage 1b (T2a, N0, cM0);  b. 05/2013 s/p VATS & LULobectomy.  . Monocytosis   . Osteopenia   . Paroxysmal atrial flutter ([facility])    a. 03/2013->no recurrence;  b. CHA2DS2VASc = 2-->not currently on anticoagulation;  c. 05/2012 Echo: EF 55-60%, normal RV.  [name] Wears glasses     Past Surgical History:  Procedure Laterality Date  . BRONCHOSCOPY     04/06/2013  . CARDIOVASCULAR STRESS TEST  05/2013   a. No evidence of ischemia or infarct, EF 70%, no WMAs  . CATARACT EXTRACTION  W/PHACO Left 07/14/2015   Procedure: CATARACT EXTRACTION PHACO AND INTRAOCULAR LENS PLACEMENT (IOC);  Surgeon: [name], MD;  Location: [place];  Service: Ophthalmology;  Laterality: Left;  . CATARACT EXTRACTION W/PHACO Right 10/01/2015   Procedure: CATARACT EXTRACTION PHACO AND INTRAOCULAR LENS PLACEMENT (IOC);  Surgeon: [name], MD;  Location: [place];  Service: Ophthalmology;  Laterality: Right;  . COLONOSCOPY W/ POLYPECTOMY     bleed after-had to go to surgery to stop bleeding via colonoscopy  . COLONOSCOPY WITH PROPOFOL N/A 11/19/2015   Procedure: COLONOSCOPY WITH PROPOFOL;  Surgeon: [name], MD;  Location: [facility] ENDOSCOPY;  Service: Endoscopy;  Laterality: N/A;  . DUPUYTREN CONTRACTURE RELEASE  12/15/2011   Procedure: DUPUYTREN CONTRACTURE RELEASE;  Surgeon: [name], MD;  Location: [place];  Service: Orthopedics;  Laterality: Left;  Fasciotomy left ring finger dupuytrens  . LUNG LOBECTOMY  06/10/13   upper left lung  . MULTIPLE TOOTH EXTRACTIONS    . REMOVAL RETAINED LENS Right 11/17/2015   Procedure: REMOVAL RETAINED LENS FROAGMENTS RIGHT EYE;  Surgeon: [name], MD;  Location: [place];  Service: Ophthalmology;  Laterality: Right;  . TONSILLECTOMY    . UPPER GASTROINTESTINAL ENDOSCOPY  11-03-15   Dr [name]  . VASECTOMY    . VIDEO ASSISTED THORACOSCOPY (VATS)/WEDGE RESECTION Left 06/04/2013   Procedure: VIDEO ASSISTED THORACOSCOPY (VATS)/WEDGE RESECTION;  Surgeon: [name], MD;  Location: [place];  Service: Thoracic;  Laterality: Left;  [name] VIDEO BRONCHOSCOPY N/A 06/04/2013   Procedure: VIDEO BRONCHOSCOPY;  Surgeon: [name], MD;  Location: [facility] OR;  Service: Thoracic;  Laterality: N/A;    Allergies: Latex and Tape  Medications: Prior to Admission medications   Medication Sig Start Date End Date Taking? Authorizing Provider  amLODipine (NORVASC) 2.5 MG tablet TAKE ONE TABLET BY  MOUTH ONCE DAILY 04/11/17  Yes Gollan, Kathlene November, MD  atorvastatin (LIPITOR) 40 MG tablet TAKE 1 TABLET BY MOUTH AT BEDTIME FOR CHOLESTEROL 02/28/17  Yes Lada, Satira Anis, MD  b complex vitamins capsule Take 1 capsule by mouth daily. AM   Yes [provider]  Calcium Carbonate-Vitamin D3 (CALCIUM 600/VITAMIN D) 600-400 MG-UNIT TABS Take by mouth daily.   Yes [provider]  carvedilol (COREG) 3.125 MG tablet TAKE ONE TABLET BY MOUTH ONCE DAILY IN AM 02/28/17  Yes Gollan, Kathlene November, MD  Cinnamon 500 MG capsule Take 500 mg by mouth 2 (two) times daily. AM AND BEDTIME   Yes [provider]  finasteride (PROSCAR) 5 MG tablet TAKE ONE TABLET BY MOUTH ONCE DAILY 10/05/16  Yes Lada, Satira Anis, MD  loratadine (CLARITIN) 10 MG tablet Take 10 mg by mouth daily. AM   Yes [provider]  losartan (COZAAR) 100 MG tablet TAKE ONE TABLET BY MOUTH ONCE DAILY 11/30/16  Yes Gollan, Kathlene November, MD  Multiple Vitamins-Minerals (MULTIVITAMIN WITH MINERALS) tablet Take 1 tablet by mouth daily. AM   Yes [provider]  polycarbophil (FIBERCON) 625 MG tablet Take 625 mg by mouth daily. AM   Yes [provider]  polyethylene glycol (MIRALAX / GLYCOLAX) packet Take 17 g by mouth daily.   Yes [provider]  tamsulosin (FLOMAX) 0.4 MG CAPS capsule TAKE ONE CAPSULE BY MOUTH ONCE DAILY 04/26/17  Yes Lada, Satira Anis, MD  vitamin C (ASCORBIC ACID) 500 MG tablet Take 500 mg by mouth daily. AM   Yes [provider]     Family History  Problem Relation Age of Onset  . Stroke Mother   . Alzheimer's disease Mother   . Hypertension Sister   . Hyperlipidemia Sister   . Hypertension Brother   . Hyperlipidemia Brother   . Diabetes Brother   . Cancer Sister        lung and colon    Social History   Socioeconomic History  . Marital status: Married    Spouse name: Not on file  . Number of children: Not on file  . Years of education: Not on file  .  Highest education level: Not on file  Social Needs  . Financial resource strain: Not on file  . Food insecurity - worry: Not on file  . Food insecurity - inability: Not on file  . Transportation needs - medical: Not on file  . Transportation needs - non-medical: Not on file  Occupational History  . Not on file  Tobacco Use  . Smoking status: Former Smoker    Packs/day: 1.00    Years: 40.00    Pack years: 40.00    Types: Cigarettes    Last attempt to quit: 12/09/1998    Years since quitting: 18.5  . Smokeless tobacco: Never Used  Substance and Sexual Activity  . Alcohol use: No  . Drug use: No  . Sexual activity: Yes  Other Topics Concern  . Not on file  Social History Narrative  .  Not on file    ECOG Status: 1 - Symptomatic but completely ambulatory  Review of Systems: A 12 point ROS discussed and pertinent positives are indicated in the HPI above.  All other systems are negative.  Review of Systems  Vital Signs: BP 128/62   Pulse 64   Temp 98.1 F (36.7 C) (Oral)   Resp 14   Ht '5\' 4"'$  (1.626 m)   Wt 157 lb (71.2 kg)   SpO2 97%   BMI 26.95 kg/m   Physical Exam    Head and neck: Clear sclera. No signs of jaundice. No evidence of liver failure. Cardiac-or: Regular rate and rhythm. No murmur. Lungs: Clear bilaterally Abdomen: Soft, nontender, nondistended. No organomegaly Muscle skeletal: No peripheral edema Neuro: Alert and oriented. Aunts questions previously. Psych: Normal affect and behavior.   Imaging: Ct Chest W Contrast  Result Date: 06/27/2017 CLINICAL DATA:  77 year old male with history of lung cancer status post left upper lobectomy. Follow-up study. EXAM: CT CHEST WITH CONTRAST TECHNIQUE: Multidetector CT imaging of the chest was performed during intravenous contrast administration. CONTRAST:  38m ISOVUE-300 IOPAMIDOL (ISOVUE-300) INJECTION 61% COMPARISON:  Multiple priors, most recently chest CT 06/25/2016. FINDINGS: Cardiovascular: Heart size is  normal. There is no significant pericardial fluid, thickening or pericardial calcification. There is aortic atherosclerosis, as well as atherosclerosis of the great vessels of the mediastinum and the coronary arteries, including calcified atherosclerotic plaque in the left main coronary arteries. Mediastinum/Nodes: No pathologically enlarged mediastinal or hilar lymph nodes. Esophagus is unremarkable in appearance. No axillary lymphadenopathy. Lungs/Pleura: Status post left upper lobectomy. Compensatory hyperexpansion of the left lower lobe. No suspicious appearing pulmonary nodules or masses are noted. Calcified granuloma in the posterior aspect of the right upper lobe incidentally noted. Mild diffuse bronchial wall thickening with mild centrilobular and paraseptal emphysema. No acute consolidative airspace disease. No pleural effusions. Upper Abdomen: Small partially calcified gallstones lying dependently in the gallbladder measuring up to 6 mm. Aortic atherosclerosis. Musculoskeletal: There are no aggressive appearing lytic or blastic lesions noted in the visualized portions of the skeleton. IMPRESSION: 1. Status post left upper lobectomy. No findings to suggest local recurrence or metastatic disease in the thorax on today's examination. 2. Aortic atherosclerosis, in addition to left main coronary artery disease. Assessment for potential risk factor modification, dietary therapy or pharmacologic therapy may be warranted, if clinically indicated. 3. Mild diffuse bronchial wall thickening with mild centrilobular and paraseptal emphysema; imaging findings suggestive of underlying COPD. 4. Cholelithiasis. Aortic Atherosclerosis (ICD10-I70.0) and Emphysema (ICD10-J43.9). Electronically Signed   By: DVinnie LangtonM.D.   On: 06/27/2017 15:37   Mr Liver W Wo Contrast  Result Date: 06/27/2017 CLINICAL DATA:  Metastatic neuroendocrine tumor of the left upper lobe. EXAM: MRI ABDOMEN WITHOUT AND WITH CONTRAST  TECHNIQUE: Multiplanar multisequence MR imaging of the abdomen was performed both before and after the administration of intravenous contrast. CONTRAST:  15 cc MultiHance COMPARISON:  MRI 12/03/2016 and 09/07/2016 FINDINGS: Examination is limited due to inability to hold his breath. Lower chest: The lung bases are grossly clear. Hepatobiliary: Interval significant enlargement of the segment 3 liver lesion. It measures 4.3 x 3.9 cm and previously measured 3.3 x 2.3 cm. Although the study is limited there appears to be a new lesion in the lateral aspect of segment 8 measuring approximately 13.5 mm. I suspect there is also a new lesion in segment 6 measuring 8 mm. Pancreas:  No mass, inflammation or ductal dilatation. Spleen:  Stable 15 mm lesion, likely  benign hemangioma. Adrenals/Urinary Tract: The adrenal glands and kidneys are grossly normal and stable. The left kidney demonstrates renal cortical thinning and atrophy. Stomach/Bowel: Visualized portions within the abdomen are unremarkable. Vascular/Lymphatic: No pathologically enlarged lymph nodes identified. No abdominal aortic aneurysm demonstrated. Other:  No ascites or abdominal wall hernia. Musculoskeletal: No significant bony findings. IMPRESSION: 1. Enlarging segment 3 liver lesion now measuring 4.3 x 3.9 cm and previously measuring 3.3 x 2.3 cm. Findings consistent with metastatic neuroendocrine tumor. 2. Suspect new lesions in segment 8 and segment 6. PET CT with Dotatate may be helpful for for further evaluation, if clinically necessary. 3. No abdominal lymphadenopathy. 4. Stable benign-appearing splenic lesion, likely hemangioma. Electronically Signed   By: Marijo Sanes M.D.   On: 06/27/2017 15:12    Labs:  CBC: Recent Labs    03/02/17 1124 04/06/17 1132 05/05/17 1315 06/30/17 1420  WBC 7.8 7.7 9.9 9.4  HGB 15.2 15.2 15.6 15.0  HCT 44.9 45.3 46.5 45.0  PLT 196 177 190 186    COAGS: Recent Labs    12/17/16 1423  INR 1.0  APTT 30     BMP: Recent Labs    03/02/17 1124 04/06/17 1132 05/05/17 1315 06/27/17 1313 06/30/17 1420  NA 140 140 141  --  139  K 4.9 4.9 4.9  --  4.7  CL 105 105 104  --  105  CO2 '26 28 30  '$ --  29  GLUCOSE 124* 114* 96  --  96  BUN 17 23* 19  --  17  CALCIUM 9.7 9.9 9.7  --  9.4  CREATININE 0.87 0.91 1.01 0.90 1.23  GFRNONAA >60 >60 >60  --  56*  GFRAA >60 >60 >60  --  >60    LIVER FUNCTION TESTS: Recent Labs    03/02/17 1124 04/06/17 1132 05/05/17 1315 06/30/17 1420  BILITOT 1.0 1.1 0.9 0.7  AST '23 22 23 21  '$ ALT '18 19 20 18  '$ ALKPHOS 118 106 115 98  PROT 7.3 7.3 7.4 6.9  ALBUMIN 4.2 4.3 4.3 4.1    TUMOR MARKERS: Recent Labs    03/02/17 1124 04/06/17 1132 05/05/17 1315 06/30/17 1420  CHROMGRNA 6* 7* 11* 10*    Assessment and Plan:  Stage for metastatic neuroendocrine tumor to the liver. Left hepatic lesion has clearly enlarged by surveillance imaging now measuring up to 5 cm. There is the suggestion of 2 new developing small right hepatic lesions. Patient has an excellent functional status remains asymptomatic. He remains on monthly Sandostatin injections.  Treatment options for the liver metastatic disease were reviewed. Our discussion centered around Y 90 radio embolization. The procedure, risks, benefits and alternatives were reviewed. The expected goals, outcomes, and recovery were reviewed. Our discussion centered around treating the enlarging left hepatic lesion initially with Y 90 followed by continue surveillance imaging initially at 3 months posttreatment. All questions were addressed. They have a clear understanding of the procedure. He would like to proceed with scheduling the procedure electively at [facility] in the next few weeks. I think he is an excellent candidate for Y 90 embolization.  Thank you for this interesting consult.  I greatly enjoyed meeting Jason Malone and look forward to participating in their care.  A copy of this report was  sent to the requesting provider on this date.  Electronically Signed: Greggory Keen 07/07/2017, 1:54 PM   I spent a total of  60 Minutes   in face to face in clinical consultation,  greater than 50% of which was counseling/coordinating care for  This patient with metastatic neuroendocrine tumor to the liver.

## 2017-07-08 ENCOUNTER — Other Ambulatory Visit (HOSPITAL_COMMUNITY): Payer: Self-pay | Admitting: Interventional Radiology

## 2017-07-08 DIAGNOSIS — C7B8 Other secondary neuroendocrine tumors: Secondary | ICD-10-CM

## 2017-07-24 ENCOUNTER — Other Ambulatory Visit: Payer: Self-pay | Admitting: Radiology

## 2017-07-24 NOTE — Progress Notes (Signed)
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  INTERVAL HISTORY: Patient returns to clinic today for further evaluation and continuation of Lanreotide. He is currently undergoing procedure for his Y-90 injection. He does not complain of diarrhea today.  He has no neurologic complaints. He denies any recent fevers or illnesses. He has a fair appetite and denies weight loss. He denies any chest pain, shortness of breath, hemoptysis, or cough. He denies any abdominal pain. He has no nausea, vomiting, or constipation. He has no urinary complaints. Patient offers no further specific complaints today.  REVIEW OF SYSTEMS:   Review of Systems  Constitutional: Negative.  Negative for fever, malaise/fatigue and weight loss.  Respiratory: Negative.  Negative for cough, hemoptysis and shortness of breath.   Cardiovascular: Negative.  Negative for chest pain and leg swelling.  Gastrointestinal: Negative.  Negative for abdominal pain and diarrhea.  Genitourinary: Negative.   Musculoskeletal: Negative.   Skin: Negative.  Negative for rash.  Neurological: Negative.  Negative for dizziness, sensory change, focal weakness and weakness.  Psychiatric/Behavioral: Negative.  The patient is not nervous/anxious.     As per HPI. Otherwise, a complete review of systems is negative.  PAST MEDICAL  HISTORY: Past Medical History:  Diagnosis Date  . Allergic rhinitis   . Benign prostatic hypertrophy with lower urinary tract symptoms (LUTS)   . Chest pain    a. 05/2013 MV: EF 70%, no ischemia.  . Diverticulitis    DIVERTICULOSIS  . Enlarged prostate   . Essential hypertension    CONTROLLED ON MEDS  . Full dentures    upper and lower  . H/O hypokalemia   . Hx of atrial fibrillation without current medication    CARDIOLOGIST-DR [facility]  . 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 Hyperlipemia   . Hypomagnesemia   . IFG (impaired fasting glucose)   . Liver cancer ([place])   . Liver cancer ([place]) 12/08/2016  . Liver mass, left lobe 08/28/2016   Probably hemangioma; will get MRI of liver, refer to GI  . Lung cancer ([place])    a. carcinoid, left lung, Stage 1b (T2a, N0, cM0);  b. 05/2013 s/p VATS & LULobectomy.  . Monocytosis   . Osteopenia   . Paroxysmal atrial flutter ([facility])    a. 03/2013->no recurrence;  b. CHA2DS2VASc = 2-->not currently on anticoagulation;  c. 05/2012 Echo: EF 55-60%, normal RV.  [name] Wears glasses     PAST SURGICAL HISTORY: Past Surgical History:  Procedure Laterality Date  . BRONCHOSCOPY     04/06/2013  . CARDIOVASCULAR STRESS TEST  05/2013   a. No evidence of ischemia or infarct, EF 70%, no WMAs  . CATARACT EXTRACTION W/PHACO Left 07/14/2015   Procedure: CATARACT EXTRACTION PHACO AND INTRAOCULAR LENS PLACEMENT (I[facility]);  Surgeon: [name], MD;  Location: [place];  Service: Ophthalmology;  Laterality: Left;  . CATARACT EXTRACTION W/PHACO Right 10/01/2015   Procedure: CATARACT EXTRACTION PHACO AND INTRAOCULAR LENS PLACEMENT (  [place]);  Surgeon: [name], MD;  Location: [place];  Service: Ophthalmology;  Laterality: Right;  . COLONOSCOPY W/ POLYPECTOMY     bleed after-had to go to surgery to stop bleeding via colonoscopy  . COLONOSCOPY WITH PROPOFOL N/A 11/19/2015   Procedure: COLONOSCOPY WITH  PROPOFOL;  Surgeon: [name], MD;  [facility];  Service: Endoscopy;  Laterality: N/A;  . DUPUYTREN CONTRACTURE RELEASE  12/15/2011   Procedure: DUPUYTREN CONTRACTURE RELEASE;  Surgeon: [name], MD;  Location: [place];  Service: Orthopedics;  Laterality: Left;  Fasciotomy left ring finger dupuytrens  . IR ANGIOGRAM SELECTIVE EACH ADDITIONAL VESSEL  07/26/2017  . IR ANGIOGRAM SELECTIVE EACH ADDITIONAL VESSEL  07/26/2017  . IR ANGIOGRAM SELECTIVE EACH ADDITIONAL VESSEL  07/26/2017  . IR ANGIOGRAM SELECTIVE EACH ADDITIONAL VESSEL  07/26/2017  . IR ANGIOGRAM SELECTIVE EACH ADDITIONAL VESSEL  07/26/2017  . IR ANGIOGRAM VISCERAL SELECTIVE  07/26/2017  . IR EMBO ARTERIAL NOT HEMORR HEMANG INC GUIDE ROADMAPPING  07/26/2017  . IR RADIOLOGIST EVAL & MGMT  07/07/2017  . IR US GUIDE VASC ACCESS RIGHT  07/26/2017  . LUNG LOBECTOMY  06/10/13   upper left lung  . MULTIPLE TOOTH EXTRACTIONS    . REMOVAL RETAINED LENS Right 11/17/2015   Procedure: REMOVAL RETAINED LENS FROAGMENTS RIGHT EYE;  Surgeon: [name], MD;  Location: [place];  Service: Ophthalmology;  Laterality: Right;  . TONSILLECTOMY    . UPPER GASTROINTESTINAL ENDOSCOPY  11-03-15   Dr [name]  . VASECTOMY    . VIDEO ASSISTED THORACOSCOPY (VATS)/WEDGE RESECTION Left 06/04/2013   Procedure: VIDEO ASSISTED THORACOSCOPY (VATS)/WEDGE RESECTION;  Surgeon: [name], MD;  Location: [place];  Service: Thoracic;  Laterality: Left;  [name] VIDEO BRONCHOSCOPY N/A 06/04/2013   Procedure: VIDEO BRONCHOSCOPY;  Surgeon: [name], MD;  Location: [facility] OR;  Service: Thoracic;  Laterality: N/A;    FAMILY HISTORY: Family History  Problem Relation Age of Onset  . Stroke Mother   . Alzheimer's disease Mother   . Hypertension Sister   . Hyperlipidemia Sister   . Hypertension Brother   . Hyperlipidemia Brother   . Diabetes Brother   . Cancer Sister        lung and colon    ADVANCED DIRECTIVES  (Y/N):  N  HEALTH MAINTENANCE: Social History   Tobacco Use  . Smoking status: Former Smoker    Packs/day: 1.00    Years: 40.00    Pack years: 40.00    Types: Cigarettes    Last attempt to quit: 12/09/1998    Years since quitting: 18.6  . Smokeless tobacco: Never Used  Substance Use Topics  . Alcohol use: No  . Drug use: No     Colonoscopy:  PAP:  Bone density:  Lipid panel:  Allergies  Allergen Reactions  . Latex Itching    Tape only- REACTED TO BANDAGE ON ABDOMEN  . Tape Itching    Surgical tapes     Current Outpatient Medications  Medication Sig Dispense Refill  . amLODipine (NORVASC) 2.5 MG tablet TAKE ONE TABLET BY MOUTH ONCE DAILY 90 tablet 2  . atorvastatin (LIPITOR) 40 MG tablet TAKE 1 TABLET BY MOUTH AT BEDTIME FOR CHOLESTEROL 90 tablet 1  . b complex vitamins capsule Take 1 capsule by mouth daily. AM    . Calcium Carbonate-Vitamin D3 (CALCIUM 600/VITAMIN D) 600-400 MG-UNIT TABS Take by mouth daily.    . carvedilol (COREG) 3.125 MG tablet TAKE ONE TABLET BY  MOUTH ONCE DAILY IN AM 90 tablet 3  . Cinnamon 500 MG capsule Take 500 mg by mouth 2 (two) times daily. AM AND BEDTIME    . finasteride (PROSCAR) 5 MG tablet TAKE ONE TABLET BY MOUTH ONCE DAILY 90 tablet 3  . loratadine (CLARITIN) 10 MG tablet Take 10 mg by mouth daily. AM    . losartan (COZAAR) 100 MG tablet TAKE ONE TABLET BY MOUTH ONCE DAILY 90 tablet 3  . Multiple Vitamins-Minerals (MULTIVITAMIN WITH MINERALS) tablet Take 1 tablet by mouth daily. AM    . polycarbophil (FIBERCON) 625 MG tablet Take 625 mg by mouth daily. AM    . polyethylene glycol (MIRALAX / GLYCOLAX) packet Take 17 g by mouth daily.    . tamsulosin (FLOMAX) 0.4 MG CAPS capsule TAKE ONE CAPSULE BY MOUTH ONCE DAILY 90 capsule 3  . vitamin C (ASCORBIC ACID) 500 MG tablet Take 500 mg by mouth daily. AM     No current facility-administered medications for this visit.     OBJECTIVE: Vitals:   07/28/17 1032  BP: 134/79  Pulse: 60   Temp: 97.8 F (36.6 C)     Body mass index is 27.43 kg/m.    ECOG FS:0 - Asymptomatic  General: Well-developed, well-nourished, no acute distress. Eyes: Pink conjunctiva, anicteric sclera. Lungs: Clear to auscultation bilaterally. Heart: Regular rate and rhythm. No rubs, murmurs, or gallops. Abdomen: Soft, nontender, nondistended. No organomegaly noted, normoactive bowel sounds. Musculoskeletal: No edema, cyanosis, or clubbing. Neuro: Alert, answering all questions appropriately. Cranial nerves grossly intact. Skin: No rashes or petechiae noted. Psych: Normal affect.   LAB RESULTS:  Lab Results  Component Value Date   NA 138 07/28/2017   K 4.4 07/28/2017   CL 104 07/28/2017   CO2 24 07/28/2017   GLUCOSE 150 (H) 07/28/2017   BUN 22 (H) 07/28/2017   CREATININE 1.05 07/28/2017   CALCIUM 9.3 07/28/2017   PROT 7.3 07/28/2017   ALBUMIN 4.0 07/28/2017   AST 20 07/28/2017   ALT 18 07/28/2017   ALKPHOS 109 07/28/2017   BILITOT 1.0 07/28/2017   GFRNONAA >60 07/28/2017   GFRAA >60 07/28/2017    Lab Results  Component Value Date   WBC 14.0 (H) 07/28/2017   NEUTROABS 10.4 (H) 07/28/2017   HGB 14.2 07/28/2017   HCT 41.8 07/28/2017   MCV 90.2 07/28/2017   PLT 193 07/28/2017     STUDIES: Nm Liver Img Spect  Result Date: 07/27/2017 CLINICAL DATA:  Metastatic neuroendocrine tumor to the liver. EXAM: NUCLEAR MEDICINE LIVER SCAN; ULTRASOUND MISCELLANEOUS SOFT TISSUE TECHNIQUE: Abdominal images were obtained in multiple projections after intrahepatic arterial injection of radiopharmaceutical. SPECT imaging was performed. Lung shunt calculation was performed. RADIOPHARMACEUTICALS:  5.33millicurie MAA TECHNETIUM TO 23M ALBUMIN AGGREGATED COMPARISON:  MRI 12/03/2016, 06/27/2017 FINDINGS: The injected microaggregated albumin localizes within the liver. No evidence of activity within the stomach, duodenum, or bowel. Calculated shunt fraction to the lungs equals 10.1%. IMPRESSION: 1. No  significant extrahepatic radiotracer activity following intrahepatic arterial injection of MAA. 2. Lung shunt fraction equals 10.1% Electronically Signed   By: Suzy Bouchard M.D.   On: 07/27/2017 07:54   Ir Angiogram Visceral Selective  Result Date: 07/26/2017 INDICATION: METASTATIC NEUROENDOCRINE TUMOR TO THE LIVER EXAM: ULTRASOUND GUIDANCE FOR VASCULAR ACCESS SELECTIVE CATHETERIZATIONS AND ANGIOGRAMS OF THE CELIAC, COMMON HEPATIC, PROPER HEPATIC, RIGHT HEPATIC, GASTRODUODENAL ARTERY, AND LEFT HEPATIC ARTERIES SUCCESSFUL MICRO COIL EMBOLIZATION OF THE GASTRODUODENAL ARTERY LEFT HEPATIC ARTERY TECHNETIUM MAA INJECTION FOR HEPATIC PULMONARY SHUNT CALCULATION MEDICATIONS: 3.375  G ZOSYN. The antibiotic was administered within 1 hour of the procedure ANESTHESIA/SEDATION: Moderate (conscious) sedation was employed during this procedure. A total of Versed 3.0 mg and Fentanyl 100 mcg was administered intravenously. Moderate Sedation Time: 58 minutes. The patient's level of consciousness and vital signs were monitored continuously by radiology nursing throughout the procedure under my direct supervision. CONTRAST:  98 CC ISOVUE-300 FLUOROSCOPY TIME:  Fluoroscopy Time: 14 minutes 0 seconds (898 mGy). COMPLICATIONS: None immediate. PROCEDURE: Informed consent was obtained from the patient following explanation of the procedure, risks, benefits and alternatives. The patient understands, agrees and consents for the procedure. All questions were addressed. A time out was performed prior to the initiation of the procedure. Maximal barrier sterile technique utilized including caps, mask, sterile gowns, sterile gloves, large sterile drape, hand hygiene, and Betadine prep. Under sterile conditions and local anesthesia, ultrasound micropuncture access performed the right common femoral artery. Images obtained for documentation. Five fr sheath inserted over a Bentson guidewire. Initially a C2 catheter was utilized to select the  celiac artery. Celiac angiogram performed. Celiac: Celiac is widely patent including its branches. The splenic, left gastric, hepatic vasculature all patent. Because of angulation, catheter exchanged for a Chung 2.5 catheter which was utilized to reselect the celiac origin. Renegade STC microcatheter over a single angle GT Glidewire utilized to select the common hepatic artery. Common hepatic angiogram performed. Common hepatic: Common hepatic, gastroduodenal, proper hepatic, right and left hepatic arteries are all patent. Catheter was advanced into the proper hepatic artery. Proper hepatic angiogram performed. Proper hepatic: The proper, left, and right hepatic vasculature all patent. Very small right gastric artery noted off of the proper hepatic artery. Over the guidewire, Renegade STC catheter was advanced to the right hepatic artery. Right hepatic: Right hepatic artery is widely patent. With delayed imaging, there are 2 areas vascular blushing peripherally in the right hepatic lobe suspicious for right hepatic metastases. Catheter was retracted and utilized to select the gastroduodenal artery: GDA: GDA is widely patent as well as the pancreatic branches and the gastroepiploic artery. GDA embolization: Several 4 and 5 mm interlock micro coils were deployed to occlude the gastroduodenal artery successfully to prevent non target Y 90 embolization within this vascular territory. Renegade STC catheter was utilized over a double angled Glidewire to select the left hepatic artery. Left hepatic: Left hepatic artery is widely patent. Faint tumor blushing present within the left hepatic lobe lateral segment correlating with the dominant left hepatic metastasis. This access position appears safe for Y 90 radio embolization. Left hepatic technetium MAA injection: 5 millicuries technetium MAA injected into left hepatic artery simulating the initial treatment and to calculate the hepatic pulmonary shunt. Access removed.  Hemostasis obtained with a ExoSeal device. No immediate complication. Patient tolerated the procedure well. Catheter and guidewire requirements: Chung 2.5, Renegade STC, single and double angle GT 018 glide wires IMPRESSION: Successful pre Y 90 hepatic angiograms for vascular anatomy mapping prior to initial left hepatic Y 90 radio embolization. Successful gastroduodenal artery micro coil embolization to prevent non target Y 90 embolization Successful left hepatic technetium MAA injection for hepatic pulmonary shunt calculation Electronically Signed   By: Jerilynn Mages.  Shick M.D.   On: 07/26/2017 12:37   Ir Angiogram Selective Each Additional Vessel  Result Date: 07/26/2017 INDICATION: METASTATIC NEUROENDOCRINE TUMOR TO THE LIVER EXAM: ULTRASOUND GUIDANCE FOR VASCULAR ACCESS SELECTIVE CATHETERIZATIONS AND ANGIOGRAMS OF THE CELIAC, COMMON HEPATIC, PROPER HEPATIC, RIGHT HEPATIC, GASTRODUODENAL ARTERY, AND LEFT HEPATIC ARTERIES SUCCESSFUL MICRO COIL EMBOLIZATION OF THE  GASTRODUODENAL ARTERY LEFT HEPATIC ARTERY TECHNETIUM MAA INJECTION FOR HEPATIC PULMONARY SHUNT CALCULATION MEDICATIONS: 3.375 G ZOSYN. The antibiotic was administered within 1 hour of the procedure ANESTHESIA/SEDATION: Moderate (conscious) sedation was employed during this procedure. A total of Versed 3.0 mg and Fentanyl 100 mcg was administered intravenously. Moderate Sedation Time: 58 minutes. The patient's level of consciousness and vital signs were monitored continuously by radiology nursing throughout the procedure under my direct supervision. CONTRAST:  98 CC ISOVUE-300 FLUOROSCOPY TIME:  Fluoroscopy Time: 14 minutes 0 seconds (898 mGy). COMPLICATIONS: None immediate. PROCEDURE: Informed consent was obtained from the patient following explanation of the procedure, risks, benefits and alternatives. The patient understands, agrees and consents for the procedure. All questions were addressed. A time out was performed prior to the initiation of the  procedure. Maximal barrier sterile technique utilized including caps, mask, sterile gowns, sterile gloves, large sterile drape, hand hygiene, and Betadine prep. Under sterile conditions and local anesthesia, ultrasound micropuncture access performed the right common femoral artery. Images obtained for documentation. Five fr sheath inserted over a Bentson guidewire. Initially a C2 catheter was utilized to select the celiac artery. Celiac angiogram performed. Celiac: Celiac is widely patent including its branches. The splenic, left gastric, hepatic vasculature all patent. Because of angulation, catheter exchanged for a Chung 2.5 catheter which was utilized to reselect the celiac origin. Renegade STC microcatheter over a single angle GT Glidewire utilized to select the common hepatic artery. Common hepatic angiogram performed. Common hepatic: Common hepatic, gastroduodenal, proper hepatic, right and left hepatic arteries are all patent. Catheter was advanced into the proper hepatic artery. Proper hepatic angiogram performed. Proper hepatic: The proper, left, and right hepatic vasculature all patent. Very small right gastric artery noted off of the proper hepatic artery. Over the guidewire, Renegade STC catheter was advanced to the right hepatic artery. Right hepatic: Right hepatic artery is widely patent. With delayed imaging, there are 2 areas vascular blushing peripherally in the right hepatic lobe suspicious for right hepatic metastases. Catheter was retracted and utilized to select the gastroduodenal artery: GDA: GDA is widely patent as well as the pancreatic branches and the gastroepiploic artery. GDA embolization: Several 4 and 5 mm interlock micro coils were deployed to occlude the gastroduodenal artery successfully to prevent non target Y 90 embolization within this vascular territory. Renegade STC catheter was utilized over a double angled Glidewire to select the left hepatic artery. Left hepatic: Left hepatic  artery is widely patent. Faint tumor blushing present within the left hepatic lobe lateral segment correlating with the dominant left hepatic metastasis. This access position appears safe for Y 90 radio embolization. Left hepatic technetium MAA injection: 5 millicuries technetium MAA injected into left hepatic artery simulating the initial treatment and to calculate the hepatic pulmonary shunt. Access removed. Hemostasis obtained with a ExoSeal device. No immediate complication. Patient tolerated the procedure well. Catheter and guidewire requirements: Chung 2.5, Renegade STC, single and double angle GT 018 glide wires IMPRESSION: Successful pre Y 90 hepatic angiograms for vascular anatomy mapping prior to initial left hepatic Y 90 radio embolization. Successful gastroduodenal artery micro coil embolization to prevent non target Y 90 embolization Successful left hepatic technetium MAA injection for hepatic pulmonary shunt calculation Electronically Signed   By: Jerilynn Mages.  Shick M.D.   On: 07/26/2017 12:37   Ir Angiogram Selective Each Additional Vessel  Result Date: 07/26/2017 INDICATION: METASTATIC NEUROENDOCRINE TUMOR TO THE LIVER EXAM: ULTRASOUND GUIDANCE FOR VASCULAR ACCESS SELECTIVE CATHETERIZATIONS AND ANGIOGRAMS OF THE CELIAC, COMMON HEPATIC, PROPER  HEPATIC, RIGHT HEPATIC, GASTRODUODENAL ARTERY, AND LEFT HEPATIC ARTERIES SUCCESSFUL MICRO COIL EMBOLIZATION OF THE GASTRODUODENAL ARTERY LEFT HEPATIC ARTERY TECHNETIUM MAA INJECTION FOR HEPATIC PULMONARY SHUNT CALCULATION MEDICATIONS: 3.375 G ZOSYN. The antibiotic was administered within 1 hour of the procedure ANESTHESIA/SEDATION: Moderate (conscious) sedation was employed during this procedure. A total of Versed 3.0 mg and Fentanyl 100 mcg was administered intravenously. Moderate Sedation Time: 58 minutes. The patient's level of consciousness and vital signs were monitored continuously by radiology nursing throughout the procedure under my direct supervision.  CONTRAST:  98 CC ISOVUE-300 FLUOROSCOPY TIME:  Fluoroscopy Time: 14 minutes 0 seconds (898 mGy). COMPLICATIONS: None immediate. PROCEDURE: Informed consent was obtained from the patient following explanation of the procedure, risks, benefits and alternatives. The patient understands, agrees and consents for the procedure. All questions were addressed. A time out was performed prior to the initiation of the procedure. Maximal barrier sterile technique utilized including caps, mask, sterile gowns, sterile gloves, large sterile drape, hand hygiene, and Betadine prep. Under sterile conditions and local anesthesia, ultrasound micropuncture access performed the right common femoral artery. Images obtained for documentation. Five fr sheath inserted over a Bentson guidewire. Initially a C2 catheter was utilized to select the celiac artery. Celiac angiogram performed. Celiac: Celiac is widely patent including its branches. The splenic, left gastric, hepatic vasculature all patent. Because of angulation, catheter exchanged for a Chung 2.5 catheter which was utilized to reselect the celiac origin. Renegade STC microcatheter over a single angle GT Glidewire utilized to select the common hepatic artery. Common hepatic angiogram performed. Common hepatic: Common hepatic, gastroduodenal, proper hepatic, right and left hepatic arteries are all patent. Catheter was advanced into the proper hepatic artery. Proper hepatic angiogram performed. Proper hepatic: The proper, left, and right hepatic vasculature all patent. Very small right gastric artery noted off of the proper hepatic artery. Over the guidewire, Renegade STC catheter was advanced to the right hepatic artery. Right hepatic: Right hepatic artery is widely patent. With delayed imaging, there are 2 areas vascular blushing peripherally in the right hepatic lobe suspicious for right hepatic metastases. Catheter was retracted and utilized to select the gastroduodenal artery: GDA:  GDA is widely patent as well as the pancreatic branches and the gastroepiploic artery. GDA embolization: Several 4 and 5 mm interlock micro coils were deployed to occlude the gastroduodenal artery successfully to prevent non target Y 90 embolization within this vascular territory. Renegade STC catheter was utilized over a double angled Glidewire to select the left hepatic artery. Left hepatic: Left hepatic artery is widely patent. Faint tumor blushing present within the left hepatic lobe lateral segment correlating with the dominant left hepatic metastasis. This access position appears safe for Y 90 radio embolization. Left hepatic technetium MAA injection: 5 millicuries technetium MAA injected into left hepatic artery simulating the initial treatment and to calculate the hepatic pulmonary shunt. Access removed. Hemostasis obtained with a ExoSeal device. No immediate complication. Patient tolerated the procedure well. Catheter and guidewire requirements: Chung 2.5, Renegade STC, single and double angle GT 018 glide wires IMPRESSION: Successful pre Y 90 hepatic angiograms for vascular anatomy mapping prior to initial left hepatic Y 90 radio embolization. Successful gastroduodenal artery micro coil embolization to prevent non target Y 90 embolization Successful left hepatic technetium MAA injection for hepatic pulmonary shunt calculation Electronically Signed   By: Jerilynn Mages.  Shick M.D.   On: 07/26/2017 12:37   Ir Angiogram Selective Each Additional Vessel  Result Date: 07/26/2017 INDICATION: METASTATIC NEUROENDOCRINE TUMOR TO THE LIVER EXAM:  ULTRASOUND GUIDANCE FOR VASCULAR ACCESS SELECTIVE CATHETERIZATIONS AND ANGIOGRAMS OF THE CELIAC, COMMON HEPATIC, PROPER HEPATIC, RIGHT HEPATIC, GASTRODUODENAL ARTERY, AND LEFT HEPATIC ARTERIES SUCCESSFUL MICRO COIL EMBOLIZATION OF THE GASTRODUODENAL ARTERY LEFT HEPATIC ARTERY TECHNETIUM MAA INJECTION FOR HEPATIC PULMONARY SHUNT CALCULATION MEDICATIONS: 3.375 G ZOSYN. The antibiotic  was administered within 1 hour of the procedure ANESTHESIA/SEDATION: Moderate (conscious) sedation was employed during this procedure. A total of Versed 3.0 mg and Fentanyl 100 mcg was administered intravenously. Moderate Sedation Time: 58 minutes. The patient's level of consciousness and vital signs were monitored continuously by radiology nursing throughout the procedure under my direct supervision. CONTRAST:  98 CC ISOVUE-300 FLUOROSCOPY TIME:  Fluoroscopy Time: 14 minutes 0 seconds (898 mGy). COMPLICATIONS: None immediate. PROCEDURE: Informed consent was obtained from the patient following explanation of the procedure, risks, benefits and alternatives. The patient understands, agrees and consents for the procedure. All questions were addressed. A time out was performed prior to the initiation of the procedure. Maximal barrier sterile technique utilized including caps, mask, sterile gowns, sterile gloves, large sterile drape, hand hygiene, and Betadine prep. Under sterile conditions and local anesthesia, ultrasound micropuncture access performed the right common femoral artery. Images obtained for documentation. Five fr sheath inserted over a Bentson guidewire. Initially a C2 catheter was utilized to select the celiac artery. Celiac angiogram performed. Celiac: Celiac is widely patent including its branches. The splenic, left gastric, hepatic vasculature all patent. Because of angulation, catheter exchanged for a Chung 2.5 catheter which was utilized to reselect the celiac origin. Renegade STC microcatheter over a single angle GT Glidewire utilized to select the common hepatic artery. Common hepatic angiogram performed. Common hepatic: Common hepatic, gastroduodenal, proper hepatic, right and left hepatic arteries are all patent. Catheter was advanced into the proper hepatic artery. Proper hepatic angiogram performed. Proper hepatic: The proper, left, and right hepatic vasculature all patent. Very small right  gastric artery noted off of the proper hepatic artery. Over the guidewire, Renegade STC catheter was advanced to the right hepatic artery. Right hepatic: Right hepatic artery is widely patent. With delayed imaging, there are 2 areas vascular blushing peripherally in the right hepatic lobe suspicious for right hepatic metastases. Catheter was retracted and utilized to select the gastroduodenal artery: GDA: GDA is widely patent as well as the pancreatic branches and the gastroepiploic artery. GDA embolization: Several 4 and 5 mm interlock micro coils were deployed to occlude the gastroduodenal artery successfully to prevent non target Y 90 embolization within this vascular territory. Renegade STC catheter was utilized over a double angled Glidewire to select the left hepatic artery. Left hepatic: Left hepatic artery is widely patent. Faint tumor blushing present within the left hepatic lobe lateral segment correlating with the dominant left hepatic metastasis. This access position appears safe for Y 90 radio embolization. Left hepatic technetium MAA injection: 5 millicuries technetium MAA injected into left hepatic artery simulating the initial treatment and to calculate the hepatic pulmonary shunt. Access removed. Hemostasis obtained with a ExoSeal device. No immediate complication. Patient tolerated the procedure well. Catheter and guidewire requirements: Chung 2.5, Renegade STC, single and double angle GT 018 glide wires IMPRESSION: Successful pre Y 90 hepatic angiograms for vascular anatomy mapping prior to initial left hepatic Y 90 radio embolization. Successful gastroduodenal artery micro coil embolization to prevent non target Y 90 embolization Successful left hepatic technetium MAA injection for hepatic pulmonary shunt calculation Electronically Signed   By: Jerilynn Mages.  Shick M.D.   On: 07/26/2017 12:37   Ir Angiogram Selective  Each Additional Vessel  Result Date: 07/26/2017 INDICATION: METASTATIC NEUROENDOCRINE  TUMOR TO THE LIVER EXAM: ULTRASOUND GUIDANCE FOR VASCULAR ACCESS SELECTIVE CATHETERIZATIONS AND ANGIOGRAMS OF THE CELIAC, COMMON HEPATIC, PROPER HEPATIC, RIGHT HEPATIC, GASTRODUODENAL ARTERY, AND LEFT HEPATIC ARTERIES SUCCESSFUL MICRO COIL EMBOLIZATION OF THE GASTRODUODENAL ARTERY LEFT HEPATIC ARTERY TECHNETIUM MAA INJECTION FOR HEPATIC PULMONARY SHUNT CALCULATION MEDICATIONS: 3.375 G ZOSYN. The antibiotic was administered within 1 hour of the procedure ANESTHESIA/SEDATION: Moderate (conscious) sedation was employed during this procedure. A total of Versed 3.0 mg and Fentanyl 100 mcg was administered intravenously. Moderate Sedation Time: 58 minutes. The patient's level of consciousness and vital signs were monitored continuously by radiology nursing throughout the procedure under my direct supervision. CONTRAST:  98 CC ISOVUE-300 FLUOROSCOPY TIME:  Fluoroscopy Time: 14 minutes 0 seconds (898 mGy). COMPLICATIONS: None immediate. PROCEDURE: Informed consent was obtained from the patient following explanation of the procedure, risks, benefits and alternatives. The patient understands, agrees and consents for the procedure. All questions were addressed. A time out was performed prior to the initiation of the procedure. Maximal barrier sterile technique utilized including caps, mask, sterile gowns, sterile gloves, large sterile drape, hand hygiene, and Betadine prep. Under sterile conditions and local anesthesia, ultrasound micropuncture access performed the right common femoral artery. Images obtained for documentation. Five fr sheath inserted over a Bentson guidewire. Initially a C2 catheter was utilized to select the celiac artery. Celiac angiogram performed. Celiac: Celiac is widely patent including its branches. The splenic, left gastric, hepatic vasculature all patent. Because of angulation, catheter exchanged for a Chung 2.5 catheter which was utilized to reselect the celiac origin. Renegade STC microcatheter  over a single angle GT Glidewire utilized to select the common hepatic artery. Common hepatic angiogram performed. Common hepatic: Common hepatic, gastroduodenal, proper hepatic, right and left hepatic arteries are all patent. Catheter was advanced into the proper hepatic artery. Proper hepatic angiogram performed. Proper hepatic: The proper, left, and right hepatic vasculature all patent. Very small right gastric artery noted off of the proper hepatic artery. Over the guidewire, Renegade STC catheter was advanced to the right hepatic artery. Right hepatic: Right hepatic artery is widely patent. With delayed imaging, there are 2 areas vascular blushing peripherally in the right hepatic lobe suspicious for right hepatic metastases. Catheter was retracted and utilized to select the gastroduodenal artery: GDA: GDA is widely patent as well as the pancreatic branches and the gastroepiploic artery. GDA embolization: Several 4 and 5 mm interlock micro coils were deployed to occlude the gastroduodenal artery successfully to prevent non target Y 90 embolization within this vascular territory. Renegade STC catheter was utilized over a double angled Glidewire to select the left hepatic artery. Left hepatic: Left hepatic artery is widely patent. Faint tumor blushing present within the left hepatic lobe lateral segment correlating with the dominant left hepatic metastasis. This access position appears safe for Y 90 radio embolization. Left hepatic technetium MAA injection: 5 millicuries technetium MAA injected into left hepatic artery simulating the initial treatment and to calculate the hepatic pulmonary shunt. Access removed. Hemostasis obtained with a ExoSeal device. No immediate complication. Patient tolerated the procedure well. Catheter and guidewire requirements: Chung 2.5, Renegade STC, single and double angle GT 018 glide wires IMPRESSION: Successful pre Y 90 hepatic angiograms for vascular anatomy mapping prior to  initial left hepatic Y 90 radio embolization. Successful gastroduodenal artery micro coil embolization to prevent non target Y 90 embolization Successful left hepatic technetium MAA injection for hepatic pulmonary shunt calculation Electronically Signed  By: Eugenie Filler M.D.   On: 07/26/2017 12:37   Ir Angiogram Selective Each Additional Vessel  Result Date: 07/26/2017 INDICATION: METASTATIC NEUROENDOCRINE TUMOR TO THE LIVER EXAM: ULTRASOUND GUIDANCE FOR VASCULAR ACCESS SELECTIVE CATHETERIZATIONS AND ANGIOGRAMS OF THE CELIAC, COMMON HEPATIC, PROPER HEPATIC, RIGHT HEPATIC, GASTRODUODENAL ARTERY, AND LEFT HEPATIC ARTERIES SUCCESSFUL MICRO COIL EMBOLIZATION OF THE GASTRODUODENAL ARTERY LEFT HEPATIC ARTERY TECHNETIUM MAA INJECTION FOR HEPATIC PULMONARY SHUNT CALCULATION MEDICATIONS: 3.375 G ZOSYN. The antibiotic was administered within 1 hour of the procedure ANESTHESIA/SEDATION: Moderate (conscious) sedation was employed during this procedure. A total of Versed 3.0 mg and Fentanyl 100 mcg was administered intravenously. Moderate Sedation Time: 58 minutes. The patient's level of consciousness and vital signs were monitored continuously by radiology nursing throughout the procedure under my direct supervision. CONTRAST:  98 CC ISOVUE-300 FLUOROSCOPY TIME:  Fluoroscopy Time: 14 minutes 0 seconds (898 mGy). COMPLICATIONS: None immediate. PROCEDURE: Informed consent was obtained from the patient following explanation of the procedure, risks, benefits and alternatives. The patient understands, agrees and consents for the procedure. All questions were addressed. A time out was performed prior to the initiation of the procedure. Maximal barrier sterile technique utilized including caps, mask, sterile gowns, sterile gloves, large sterile drape, hand hygiene, and Betadine prep. Under sterile conditions and local anesthesia, ultrasound micropuncture access performed the right common femoral artery. Images obtained for  documentation. Five fr sheath inserted over a Bentson guidewire. Initially a C2 catheter was utilized to select the celiac artery. Celiac angiogram performed. Celiac: Celiac is widely patent including its branches. The splenic, left gastric, hepatic vasculature all patent. Because of angulation, catheter exchanged for a Chung 2.5 catheter which was utilized to reselect the celiac origin. Renegade STC microcatheter over a single angle GT Glidewire utilized to select the common hepatic artery. Common hepatic angiogram performed. Common hepatic: Common hepatic, gastroduodenal, proper hepatic, right and left hepatic arteries are all patent. Catheter was advanced into the proper hepatic artery. Proper hepatic angiogram performed. Proper hepatic: The proper, left, and right hepatic vasculature all patent. Very small right gastric artery noted off of the proper hepatic artery. Over the guidewire, Renegade STC catheter was advanced to the right hepatic artery. Right hepatic: Right hepatic artery is widely patent. With delayed imaging, there are 2 areas vascular blushing peripherally in the right hepatic lobe suspicious for right hepatic metastases. Catheter was retracted and utilized to select the gastroduodenal artery: GDA: GDA is widely patent as well as the pancreatic branches and the gastroepiploic artery. GDA embolization: Several 4 and 5 mm interlock micro coils were deployed to occlude the gastroduodenal artery successfully to prevent non target Y 90 embolization within this vascular territory. Renegade STC catheter was utilized over a double angled Glidewire to select the left hepatic artery. Left hepatic: Left hepatic artery is widely patent. Faint tumor blushing present within the left hepatic lobe lateral segment correlating with the dominant left hepatic metastasis. This access position appears safe for Y 90 radio embolization. Left hepatic technetium MAA injection: 5 millicuries technetium MAA injected into  left hepatic artery simulating the initial treatment and to calculate the hepatic pulmonary shunt. Access removed. Hemostasis obtained with a ExoSeal device. No immediate complication. Patient tolerated the procedure well. Catheter and guidewire requirements: Chung 2.5, Renegade STC, single and double angle GT 018 glide wires IMPRESSION: Successful pre Y 90 hepatic angiograms for vascular anatomy mapping prior to initial left hepatic Y 90 radio embolization. Successful gastroduodenal artery micro coil embolization to prevent non target Y 90 embolization  Successful left hepatic technetium MAA injection for hepatic pulmonary shunt calculation Electronically Signed   By: Jerilynn Mages.  Shick M.D.   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 The antibiotic was administered within 1 hour of the procedure ANESTHESIA/SEDATION: Moderate (conscious) sedation was employed during this procedure. A total of Versed 3.0 mg and Fentanyl 100 mcg was administered intravenously. Moderate Sedation Time: 58 minutes. The patient's level of consciousness and vital signs were monitored continuously by radiology nursing throughout the procedure under my direct supervision. CONTRAST:  98 CC ISOVUE-300 FLUOROSCOPY TIME:  Fluoroscopy Time: 14 minutes 0 seconds (898 mGy). COMPLICATIONS: None immediate. PROCEDURE: Informed consent was obtained from the patient following explanation of the procedure, risks, benefits and alternatives. The patient understands, agrees and consents for the  procedure. All questions were addressed. A time out was performed prior to the initiation of the procedure. Maximal barrier sterile technique utilized including caps, mask, sterile gowns, sterile gloves, large sterile drape, hand hygiene, and Betadine prep. Under sterile conditions and local anesthesia, ultrasound micropuncture access performed the right common femoral artery. Images obtained for documentation. Five fr sheath inserted over a Bentson guidewire. Initially a C2 catheter was utilized to select the celiac artery. Celiac angiogram performed. Celiac: Celiac is widely patent including its branches. The splenic, left gastric, hepatic vasculature all patent. Because of angulation, catheter exchanged for a Chung 2.5 catheter which was utilized to reselect the celiac origin. Renegade STC microcatheter over a single angle GT Glidewire utilized to select the common hepatic artery. Common hepatic angiogram performed. Common hepatic: Common hepatic, gastroduodenal, proper hepatic, right and left hepatic arteries are all patent. Catheter was advanced into the proper hepatic artery. Proper hepatic angiogram performed. Proper hepatic: The proper, left, and right hepatic vasculature all patent. Very small right gastric artery noted off of the proper hepatic artery. Over the guidewire, Renegade STC catheter was advanced to the right hepatic artery. Right hepatic: Right hepatic artery is widely patent. With delayed imaging, there are 2 areas vascular blushing peripherally in the right hepatic lobe suspicious for right hepatic metastases. Catheter was retracted and utilized to select the gastroduodenal artery: GDA: GDA is widely patent as well as the pancreatic branches and the gastroepiploic artery. GDA embolization: Several 4 and 5 mm interlock micro coils were deployed to occlude the gastroduodenal artery successfully to prevent non target Y 90 embolization within this vascular territory. Renegade STC catheter was  utilized over a double angled Glidewire to select the left hepatic artery. Left hepatic: Left hepatic artery is widely patent. Faint tumor blushing present within the left hepatic lobe lateral segment correlating with the dominant left hepatic metastasis. This access position appears safe for Y 90 radio embolization. Left hepatic technetium MAA injection: 5 millicuries technetium MAA injected into left hepatic artery simulating the initial treatment and to calculate the hepatic pulmonary shunt. Access removed. Hemostasis obtained with a ExoSeal device. No immediate complication. Patient tolerated the procedure well. Catheter and guidewire requirements: Chung 2.5, Renegade STC, single and double angle GT 018 glide wires IMPRESSION: Successful pre Y 90 hepatic angiograms for vascular anatomy mapping prior to initial left hepatic Y 90  radio embolization. Successful gastroduodenal artery micro coil embolization to prevent non target Y 90 embolization Successful left hepatic technetium MAA injection for hepatic pulmonary shunt calculation Electronically Signed   By: Jerilynn Mages.  Shick M.D.   On: 07/26/2017 12:37   Ir Embo Arterial Not [place] Guide Roadmapping  Result Date: 07/26/2017 INDICATION: METASTATIC NEUROENDOCRINE TUMOR TO THE LIVER EXAM: ULTRASOUND GUIDANCE FOR VASCULAR ACCESS SELECTIVE CATHETERIZATIONS AND ANGIOGRAMS OF THE CELIAC, COMMON HEPATIC, PROPER HEPATIC, RIGHT HEPATIC, GASTRODUODENAL ARTERY, AND LEFT HEPATIC ARTERIES SUCCESSFUL MICRO COIL EMBOLIZATION OF THE GASTRODUODENAL ARTERY LEFT HEPATIC ARTERY TECHNETIUM MAA INJECTION FOR HEPATIC PULMONARY SHUNT CALCULATION MEDICATIONS: 3.375 G ZOSYN. The antibiotic was administered within 1 hour of the procedure ANESTHESIA/SEDATION: Moderate (conscious) sedation was employed during this procedure. A total of Versed 3.0 mg and Fentanyl 100 mcg was administered intravenously. Moderate Sedation Time: 58 minutes. The patient's level of consciousness and vital  signs were monitored continuously by radiology nursing throughout the procedure under my direct supervision. CONTRAST:  98 CC ISOVUE-300 FLUOROSCOPY TIME:  Fluoroscopy Time: 14 minutes 0 seconds (898 mGy). COMPLICATIONS: None immediate. PROCEDURE: Informed consent was obtained from the patient following explanation of the procedure, risks, benefits and alternatives. The patient understands, agrees and consents for the procedure. All questions were addressed. A time out was performed prior to the initiation of the procedure. Maximal barrier sterile technique utilized including caps, mask, sterile gowns, sterile gloves, large sterile drape, hand hygiene, and Betadine prep. Under sterile conditions and local anesthesia, ultrasound micropuncture access performed the right common femoral artery. Images obtained for documentation. Five fr sheath inserted over a Bentson guidewire. Initially a C2 catheter was utilized to select the celiac artery. Celiac angiogram performed. Celiac: Celiac is widely patent including its branches. The splenic, left gastric, hepatic vasculature all patent. Because of angulation, catheter exchanged for a Chung 2.5 catheter which was utilized to reselect the celiac origin. Renegade STC microcatheter over a single angle GT Glidewire utilized to select the common hepatic artery. Common hepatic angiogram performed. Common hepatic: Common hepatic, gastroduodenal, proper hepatic, right and left hepatic arteries are all patent. Catheter was advanced into the proper hepatic artery. Proper hepatic angiogram performed. Proper hepatic: The proper, left, and right hepatic vasculature all patent. Very small right gastric artery noted off of the proper hepatic artery. Over the guidewire, Renegade STC catheter was advanced to the right hepatic artery. Right hepatic: Right hepatic artery is widely patent. With delayed imaging, there are 2 areas vascular blushing peripherally in the right hepatic lobe  suspicious for right hepatic metastases. Catheter was retracted and utilized to select the gastroduodenal artery: GDA: GDA is widely patent as well as the pancreatic branches and the gastroepiploic artery. GDA embolization: Several 4 and 5 mm interlock micro coils were deployed to occlude the gastroduodenal artery successfully to prevent non target Y 90 embolization within this vascular territory. Renegade STC catheter was utilized over a double angled Glidewire to select the left hepatic artery. Left hepatic: Left hepatic artery is widely patent. Faint tumor blushing present within the left hepatic lobe lateral segment correlating with the dominant left hepatic metastasis. This access position appears safe for Y 90 radio embolization. Left hepatic technetium MAA injection: 5 millicuries technetium MAA injected into left hepatic artery simulating the initial treatment and to calculate the hepatic pulmonary shunt. Access removed. Hemostasis obtained with a ExoSeal device. No immediate complication. Patient tolerated the procedure well. Catheter and guidewire requirements: Chung 2.5, Renegade STC, single and double angle GT 018 glide wires  IMPRESSION: Successful pre Y 90 hepatic angiograms for vascular anatomy mapping prior to initial left hepatic Y 90 radio embolization. Successful gastroduodenal artery micro coil embolization to prevent non target Y 90 embolization Successful left hepatic technetium MAA injection for hepatic pulmonary shunt calculation Electronically Signed   By: Jerilynn Mages.  Shick M.D.   On: 07/26/2017 12:37   Ir Radiologist Eval & Mgmt  Result Date: 07/07/2017 Please refer to notes tab for details about interventional procedure. (Op Note)  Nm Fusion  Result Date: 07/27/2017 CLINICAL DATA:  Metastatic neuroendocrine tumor to the liver. EXAM: NUCLEAR MEDICINE LIVER SCAN; ULTRASOUND MISCELLANEOUS SOFT TISSUE TECHNIQUE: Abdominal images were obtained in multiple projections after intrahepatic arterial  injection of radiopharmaceutical. SPECT imaging was performed. Lung shunt calculation was performed. RADIOPHARMACEUTICALS:  5.10millicurie MAA TECHNETIUM TO 13M ALBUMIN AGGREGATED COMPARISON:  MRI 12/03/2016, 06/27/2017 FINDINGS: The injected microaggregated albumin localizes within the liver. No evidence of activity within the stomach, duodenum, or bowel. Calculated shunt fraction to the lungs equals 10.1%. IMPRESSION: 1. No significant extrahepatic radiotracer activity following intrahepatic arterial injection of MAA. 2. Lung shunt fraction equals 10.1% Electronically Signed   By: Suzy Bouchard M.D.   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  PLAN:    1. 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 Patient underwent left upper lobe lobectomy in [place] on June 04, 2013. Final pathology results revealed a well differentiated neuroendocrine tumor with positive bronchial margins. Patient did not require adjuvant XRT or chemotherapy at that time.  MRI results from June 27, 2017 reviewed independently and report as above was continued progression of patient's known disease in his liver.  His chromogranin A levels are also trending up slightly.  Patient has begun the procedure for Y-90 injection and his actual treatment will occur on August 11, 2017.  Will repeat MRI approximately 3 months after his ablation.  Of note, patient also has 2 new lesions of undetermined significance in his liver that we will continue to monitor. Proceed with an 120 mg subcutaneous Lanreotide every 4 weeks.  Return to clinic in 4 weeks for Lanreotide and further evaluation.   2.  Diarrhea: Patient does not complain of this today.  Approximately 30 minutes was spent in discussion of which greater than 50% was consultation.  Patient expressed understanding and  was in agreement with this plan. He also understands that He can call clinic at any time with any questions, concerns, or complaints.   Cancer Staging Neuroendocrine carcinoma of lung [facility]) Staging form: Lung, AJCC 8th Edition - Clinical stage from 12/28/2016: Stage IVA (cT2a, cN0, cM1b) - Signed by Lloyd Huger, MD on 12/28/2016   Lloyd Huger, MD   07/29/2017 5:00 PM

## 2017-07-26 ENCOUNTER — Other Ambulatory Visit (HOSPITAL_COMMUNITY): Payer: Self-pay | Admitting: Interventional Radiology

## 2017-07-26 ENCOUNTER — Encounter (HOSPITAL_COMMUNITY): Payer: Self-pay

## 2017-07-26 ENCOUNTER — Encounter (HOSPITAL_COMMUNITY)
Admission: RE | Admit: 2017-07-26 | Discharge: 2017-07-26 | Disposition: A | Discharge status: Home / Self Care | Payer: Medicare Other | Source: Ambulatory Visit | Attending: Interventional Radiology | Admitting: Interventional Radiology

## 2017-07-26 ENCOUNTER — Ambulatory Visit (HOSPITAL_COMMUNITY)
Admission: RE | Admit: 2017-07-26 | Discharge: 2017-07-26 | Disposition: A | Discharge status: Home / Self Care | Payer: Medicare Other | Source: Ambulatory Visit | Attending: Interventional Radiology | Admitting: Interventional Radiology

## 2017-07-26 DIAGNOSIS — Z87891 Personal history of nicotine dependence: Secondary | ICD-10-CM | POA: Diagnosis not present

## 2017-07-26 DIAGNOSIS — Z85110 Personal history of malignant carcinoid tumor of bronchus and lung: Secondary | ICD-10-CM | POA: Insufficient documentation

## 2017-07-26 DIAGNOSIS — C7B8 Other secondary neuroendocrine tumors: Secondary | ICD-10-CM

## 2017-07-26 DIAGNOSIS — E785 Hyperlipidemia, unspecified: Secondary | ICD-10-CM | POA: Diagnosis not present

## 2017-07-26 DIAGNOSIS — C759 Malignant neoplasm of endocrine gland, unspecified: Secondary | ICD-10-CM | POA: Diagnosis not present

## 2017-07-26 DIAGNOSIS — Z91040 Latex allergy status: Secondary | ICD-10-CM | POA: Diagnosis not present

## 2017-07-26 DIAGNOSIS — I10 Essential (primary) hypertension: Secondary | ICD-10-CM | POA: Diagnosis not present

## 2017-07-26 DIAGNOSIS — Z902 Acquired absence of lung [part of]: Secondary | ICD-10-CM | POA: Insufficient documentation

## 2017-07-26 DIAGNOSIS — M8580 Other specified disorders of bone density and structure, unspecified site: Secondary | ICD-10-CM | POA: Insufficient documentation

## 2017-07-26 DIAGNOSIS — C787 Secondary malignant neoplasm of liver and intrahepatic bile duct: Secondary | ICD-10-CM | POA: Diagnosis not present

## 2017-07-26 DIAGNOSIS — R7301 Impaired fasting glucose: Secondary | ICD-10-CM | POA: Diagnosis not present

## 2017-07-26 DIAGNOSIS — N401 Enlarged prostate with lower urinary tract symptoms: Secondary | ICD-10-CM | POA: Insufficient documentation

## 2017-07-26 DIAGNOSIS — C7B02 Secondary carcinoid tumors of liver: Secondary | ICD-10-CM | POA: Insufficient documentation

## 2017-07-26 HISTORY — PX: IR ANGIOGRAM SELECTIVE EACH ADDITIONAL VESSEL: IMG667

## 2017-07-26 HISTORY — PX: IR ANGIOGRAM VISCERAL SELECTIVE: IMG657

## 2017-07-26 HISTORY — PX: IR EMBO ARTERIAL NOT HEMORR HEMANG INC GUIDE ROADMAPPING: IMG5448

## 2017-07-26 HISTORY — PX: IR US GUIDE VASC ACCESS RIGHT: IMG2390

## 2017-07-26 LAB — COMPREHENSIVE METABOLIC PANEL
ALT: 22 U/L (ref 17–63)
AST: 31 U/L (ref 15–41)
Albumin: 4.6 g/dL (ref 3.5–5.0)
Alkaline Phosphatase: 116 U/L (ref 38–126)
Anion gap: 12 (ref 5–15)
BUN: 26 mg/dL — AB (ref 6–20)
CALCIUM: 9.8 mg/dL (ref 8.9–10.3)
CHLORIDE: 102 mmol/L (ref 101–111)
CO2: 26 mmol/L (ref 22–32)
CREATININE: 0.99 mg/dL (ref 0.61–1.24)
Glucose, Bld: 127 mg/dL — ABNORMAL HIGH (ref 65–99)
Potassium: 4 mmol/L (ref 3.5–5.1)
Sodium: 140 mmol/L (ref 135–145)
Total Bilirubin: 0.9 mg/dL (ref 0.3–1.2)
Total Protein: 8 g/dL (ref 6.5–8.1)

## 2017-07-26 LAB — CBC WITH DIFFERENTIAL/PLATELET
BASOS ABS: 0.1 10*3/uL (ref 0.0–0.1)
Basophils Relative: 1 %
EOS PCT: 3 %
Eosinophils Absolute: 0.4 10*3/uL (ref 0.0–0.7)
HEMATOCRIT: 49.1 % (ref 39.0–52.0)
HEMOGLOBIN: 16 g/dL (ref 13.0–17.0)
LYMPHS ABS: 1.1 10*3/uL (ref 0.7–4.0)
LYMPHS PCT: 10 %
MCH: 30.4 pg (ref 26.0–34.0)
MCHC: 32.6 g/dL (ref 30.0–36.0)
MCV: 93.3 fL (ref 78.0–100.0)
Monocytes Absolute: 1.9 10*3/uL — ABNORMAL HIGH (ref 0.1–1.0)
Monocytes Relative: 17 %
NEUTROS ABS: 7.8 10*3/uL — AB (ref 1.7–7.7)
Neutrophils Relative %: 69 %
PLATELETS: 222 10*3/uL (ref 150–400)
RBC: 5.26 MIL/uL (ref 4.22–5.81)
RDW: 14.3 % (ref 11.5–15.5)
WBC: 11.3 10*3/uL — AB (ref 4.0–10.5)

## 2017-07-26 LAB — PROTIME-INR
INR: 1.02
PROTHROMBIN TIME: 13.3 s (ref 11.4–15.2)

## 2017-07-26 MED ORDER — MIDAZOLAM HCL 2 MG/2ML IJ SOLN
INTRAMUSCULAR | Status: AC | PRN
  Administered 2017-07-26 (×3): 1 mg via INTRAVENOUS

## 2017-07-26 MED ORDER — IOPAMIDOL (ISOVUE-300) INJECTION 61%
INTRAVENOUS | Status: AC
  Administered 2017-07-26: 45 mL via INTRA_ARTERIAL
  Filled 2017-07-26: qty 200

## 2017-07-26 MED ORDER — TECHNETIUM TO 99M ALBUMIN AGGREGATED
5.1000 | Freq: Once | INTRAVENOUS | Status: AC
  Administered 2017-07-26: 5.1 via INTRAVENOUS

## 2017-07-26 MED ORDER — FENTANYL CITRATE (PF) 100 MCG/2ML IJ SOLN
INTRAMUSCULAR | Status: AC | PRN
  Administered 2017-07-26 (×2): 50 ug via INTRAVENOUS

## 2017-07-26 MED ORDER — IOPAMIDOL (ISOVUE-300) INJECTION 61%
100.0000 mL | Freq: Once | INTRAVENOUS | Status: AC | PRN
  Administered 2017-07-26: 53 mL via INTRA_ARTERIAL

## 2017-07-26 MED ORDER — MIDAZOLAM HCL 2 MG/2ML IJ SOLN
INTRAMUSCULAR | Status: AC
  Filled 2017-07-26: qty 6

## 2017-07-26 MED ORDER — LIDOCAINE HCL 1 % IJ SOLN
INTRAMUSCULAR | Status: AC
  Filled 2017-07-26: qty 20

## 2017-07-26 MED ORDER — LIDOCAINE HCL (PF) 1 % IJ SOLN
INTRAMUSCULAR | Status: AC | PRN
  Administered 2017-07-26: 5 mL

## 2017-07-26 MED ORDER — FENTANYL CITRATE (PF) 100 MCG/2ML IJ SOLN
INTRAMUSCULAR | Status: AC
  Filled 2017-07-26: qty 4

## 2017-07-26 MED ORDER — TECHNETIUM TO 99M ALBUMIN AGGREGATED
5.1000 | Freq: Once | INTRAVENOUS | Status: AC | PRN
  Administered 2017-07-26: 5.1 via INTRAVENOUS

## 2017-07-26 MED ORDER — SODIUM CHLORIDE 0.9 % IV SOLN
INTRAVENOUS | Status: DC
  Administered 2017-07-26: 08:00:00 via INTRAVENOUS

## 2017-07-26 MED ORDER — IOPAMIDOL (ISOVUE-300) INJECTION 61%
100.0000 mL | Freq: Once | INTRAVENOUS | Status: AC | PRN
  Administered 2017-07-26: 45 mL via INTRA_ARTERIAL

## 2017-07-26 NOTE — Discharge Instructions (Signed)
Moderate Conscious Sedation, Adult, Care After These instructions provide you with information about caring for yourself after your procedure. Your health care provider may also give you more specific instructions. Your treatment has been planned according to current medical practices, but problems sometimes occur. Call your health care provider if you have any problems or questions after your procedure. What can I expect after the procedure? After your procedure, it is common:  To feel sleepy for several hours.  To feel clumsy and have poor balance for several hours.  To have poor judgment for several hours.  To vomit if you eat too soon.  Follow these instructions at home: For at least 24 hours after the procedure:   Do not: ? Participate in activities where you could fall or become injured. ? Drive. ? Use heavy machinery. ? Drink alcohol. ? Take sleeping pills or medicines that cause drowsiness. ? Make important decisions or sign legal documents. ? Take care of children on your own.  Rest. Eating and drinking  Follow the diet recommended by your health care provider.  If you vomit: ? Drink water, juice, or soup when you can drink without vomiting. ? Make sure you have little or no nausea before eating solid foods. General instructions  Have a responsible adult stay with you until you are awake and alert.  Take over-the-counter and prescription medicines only as told by your health care provider.  If you smoke, do not smoke without supervision.  Keep all follow-up visits as told by your health care provider. This is important. Contact a health care provider if:  You keep feeling nauseous or you keep vomiting.  You feel light-headed.  You develop a rash.  You have a fever. Get help right away if:  You have trouble breathing. This information is not intended to replace advice given to you by your health care provider. Make sure you discuss any questions you  have with your health care provider. Document Released: 02/14/2013 Document Revised: 09/29/2015 Document Reviewed: 08/16/2015 Elsevier Interactive Patient Education  2018 [employer].   Femoral Site Care Refer to this sheet in the next few weeks. These instructions provide you with information about caring for yourself after your procedure. Your health care provider may also give you more specific instructions. Your treatment has been planned according to current medical practices, but problems sometimes occur. Call your health care provider if you have any problems or questions after your procedure. What can I expect after the procedure? After your procedure, it is typical to have the following:  Bruising at the site that usually fades within 1-2 weeks.  Blood collecting in the tissue (hematoma) that may be painful to the touch. It should usually decrease in size and tenderness within 1-2 weeks.  Follow these instructions at home:  Take medicines only as directed by your health care provider.  You may shower 24-48 hours after the procedure or as directed by your health care provider. Remove the bandage (dressing) and gently wash the site with plain soap and water. Pat the area dry with a clean towel. Do not rub the site, because this may cause bleeding.  Do not take baths, swim, or use a hot tub until your health care provider approves.  Check your insertion site every day for redness, swelling, or drainage.  Do not apply powder or lotion to the site.  Limit use of stairs to twice a day for the first 2-3 days or as directed by your health care  provider.  Do not squat for the first 2-3 days or as directed by your health care provider.  Do not lift over 10 lb (4.5 kg) for 5 days after your procedure or as directed by your health care provider.  Ask your health care provider when it is okay to: ? Return to work or school. ? Resume usual physical activities or sports. ? Resume  sexual activity.  Do not drive home if you are discharged the same day as the procedure. Have someone else drive you.  You may drive 24 hours after the procedure unless otherwise instructed by your health care provider.  Do not operate machinery or power tools for 24 hours after the procedure or as directed by your health care provider.  If your procedure was done as an outpatient procedure, which means that you went home the same day as your procedure, a responsible adult should be with you for the first 24 hours after you arrive home.  Keep all follow-up visits as directed by your health care provider. This is important. Contact a health care provider if:  You have a fever.  You have chills.  You have increased bleeding from the site. Hold pressure on the site. Get help right away if:  You have unusual pain at the site.  You have redness, warmth, or swelling at the site.  You have drainage (other than a small amount of blood on the dressing) from the site.  The site is bleeding, and the bleeding does not stop after 30 minutes of holding steady pressure on the site.  Your leg or foot becomes pale, cool, tingly, or numb. This information is not intended to replace advice given to you by your health care provider. Make sure you discuss any questions you have with your health care provider. Document Released: 12/28/2013 Document Revised: 10/02/2015 Document Reviewed: 11/13/2013 Elsevier Interactive Patient Education  2018 [employer].   The following instructions are for future reference   Post Y-90 Radioembolization Discharge Instructions  You have been given a radioactive material during your procedure.  While it is safe for you to be discharged home from the hospital, you need to proceed directly home.    Do not use public transportation, including air travel, lasting more than 2 hours for 1 week.  Avoid crowded public places for 1 week.  Adult visitors should try to  avoid close contact with you for 1 week.    Children and pregnant females should not visit or have close contact with you for 1 week.  Items that you touch are not radioactive.  Do not sleep in the same bed as your partner for 1 week, and a condom should be used for sexual activity during the first 24 hours.  Your blood may be radioactive and caution should be used if any bleeding occurs during the recovery period.  Body fluids may be radioactive for 24 hours.  Wash your hands after voiding.  Men should sit to urinate.  Dispose of any soiled materials (flush down toilet or place in trash at home) during the first day.  Drink 6 to 8 glasses of fluids per day for 5 days to hydrate yourself.  If you need to see a doctor during the first week, you must let them know that you were treated with yttrium-90 microspheres, and will be slightly radioactive.  They can call Interventional Radiology [phone] with any questions.

## 2017-07-26 NOTE — Sedation Documentation (Signed)
transferring to short stay

## 2017-07-26 NOTE — H&P (Signed)
Chief Complaint: Patient was seen in consultation today for pre Y-90 angiogram and test dosing at the request of Shick,Michael  Referring Physician(s): Shick,Michael  Supervising Physician: Daryll Brod  Patient Status: [facility] - Out-pt  History of Present Illness: Jason Malone is a 77 y.o. male with stage 4 met well differentiated neuroendocrine tumor to the liver.  Imaging confirms interval enlargement of the largest left hepatic lesion.  He remains asymptomatic with a good functional status. No significant physical limitations. Stable appetite and weight. No recent fever or illnesses. No current chest pain, shortness of breath, abdominal pain, or dysuria. No signs of intermittent flushing syndrome or diarrhea. Overall he is doing very well.  After consultation with Dr.hick, he is determined to be a good candidate for Y-90 radioembolization. He is scheduled for pre-treatment angiogram today for 'roadmapping' and possible branch embolization followed by test dosing.    Past Medical History:  Diagnosis Date  . Allergic rhinitis   . Benign prostatic hypertrophy with lower urinary tract symptoms (LUTS)   . Chest pain    a. 05/2013 MV: EF 70%, no ischemia.  . Diverticulitis    DIVERTICULOSIS  . Enlarged prostate   . Essential hypertension    CONTROLLED ON MEDS  . Full dentures    upper and lower  . H/O hypokalemia   . Hx of atrial fibrillation without current medication    CARDIOLOGIST-DR [facility]  . 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 Hyperlipemia   . Hypomagnesemia   . IFG (impaired fasting glucose)   . Liver cancer ([place])   . Liver cancer ([place]) 12/08/2016  . Liver mass, left lobe 08/28/2016   Probably hemangioma; will get MRI of liver, refer to GI  . Lung cancer ([place])    a. carcinoid, left lung, Stage 1b (T2a, N0, cM0);  b. 05/2013 s/p VATS & LULobectomy.  . Monocytosis   . Osteopenia   . Paroxysmal atrial flutter ([facility])    a.  03/2013->no recurrence;  b. CHA2DS2VASc = 2-->not currently on anticoagulation;  c. 05/2012 Echo: EF 55-60%, normal RV.  [name] Wears glasses     Past Surgical History:  Procedure Laterality Date  . BRONCHOSCOPY     04/06/2013  . CARDIOVASCULAR STRESS TEST  05/2013   a. No evidence of ischemia or infarct, EF 70%, no WMAs  . CATARACT EXTRACTION W/PHACO Left 07/14/2015   Procedure: CATARACT EXTRACTION PHACO AND INTRAOCULAR LENS PLACEMENT (IOC);  Surgeon: [name], MD;  Location: [place];  Service: Ophthalmology;  Laterality: Left;  . CATARACT EXTRACTION W/PHACO Right 10/01/2015   Procedure: CATARACT EXTRACTION PHACO AND INTRAOCULAR LENS PLACEMENT (IOC);  Surgeon: [name], MD;  Location: [place];  Service: Ophthalmology;  Laterality: Right;  . COLONOSCOPY W/ POLYPECTOMY     bleed after-had to go to surgery to stop bleeding via colonoscopy  . COLONOSCOPY WITH PROPOFOL N/A 11/19/2015   Procedure: COLONOSCOPY WITH PROPOFOL;  Surgeon: [name], MD;  Location: [facility] ENDOSCOPY;  Service: Endoscopy;  Laterality: N/A;  . DUPUYTREN CONTRACTURE RELEASE  12/15/2011   Procedure: DUPUYTREN CONTRACTURE RELEASE;  Surgeon: [name], MD;  Location: [place];  Service: Orthopedics;  Laterality: Left;  Fasciotomy left ring finger dupuytrens  . IR RADIOLOGIST EVAL & MGMT  07/07/2017  . LUNG LOBECTOMY  06/10/13   upper left lung  . MULTIPLE TOOTH EXTRACTIONS    . REMOVAL RETAINED LENS Right 11/17/2015   Procedure: REMOVAL RETAINED LENS FROAGMENTS  RIGHT EYE;  Surgeon: [name], MD;  Location: [place];  Service: Ophthalmology;  Laterality: Right;  . TONSILLECTOMY    . UPPER GASTROINTESTINAL ENDOSCOPY  11-03-15   Dr [name]  . VASECTOMY    . VIDEO ASSISTED THORACOSCOPY (VATS)/WEDGE RESECTION Left 06/04/2013   Procedure: VIDEO ASSISTED THORACOSCOPY (VATS)/WEDGE RESECTION;  Surgeon: [name], MD;  Location: [place];  Service:  Thoracic;  Laterality: Left;  [name] VIDEO BRONCHOSCOPY N/A 06/04/2013   Procedure: VIDEO BRONCHOSCOPY;  Surgeon: [name], MD;  Location: [facility] OR;  Service: Thoracic;  Laterality: N/A;    Allergies: Latex and Tape  Medications: Prior to Admission medications   Medication Sig Start Date End Date Taking? Authorizing Provider  amLODipine (NORVASC) 2.5 MG tablet TAKE ONE TABLET BY MOUTH ONCE DAILY 04/11/17  Yes Gollan, Kathlene November, MD  atorvastatin (LIPITOR) 40 MG tablet TAKE 1 TABLET BY MOUTH AT BEDTIME FOR CHOLESTEROL 02/28/17  Yes Lada, Satira Anis, MD  b complex vitamins capsule Take 1 capsule by mouth daily. AM   Yes [provider]  Calcium Carbonate-Vitamin D3 (CALCIUM 600/VITAMIN D) 600-400 MG-UNIT TABS Take by mouth daily.   Yes [provider]  carvedilol (COREG) 3.125 MG tablet TAKE ONE TABLET BY MOUTH ONCE DAILY IN AM 02/28/17  Yes Gollan, Kathlene November, MD  Cinnamon 500 MG capsule Take 500 mg by mouth 2 (two) times daily. AM AND BEDTIME   Yes [provider]  finasteride (PROSCAR) 5 MG tablet TAKE ONE TABLET BY MOUTH ONCE DAILY 10/05/16  Yes Lada, Satira Anis, MD  loratadine (CLARITIN) 10 MG tablet Take 10 mg by mouth daily. AM   Yes [provider]  losartan (COZAAR) 100 MG tablet TAKE ONE TABLET BY MOUTH ONCE DAILY 11/30/16  Yes Gollan, Kathlene November, MD  Multiple Vitamins-Minerals (MULTIVITAMIN WITH MINERALS) tablet Take 1 tablet by mouth daily. AM   Yes [provider]  polycarbophil (FIBERCON) 625 MG tablet Take 625 mg by mouth daily. AM   Yes [provider]  polyethylene glycol (MIRALAX / GLYCOLAX) packet Take 17 g by mouth daily.   Yes [provider]  tamsulosin (FLOMAX) 0.4 MG CAPS capsule TAKE ONE CAPSULE BY MOUTH ONCE DAILY 04/26/17  Yes Lada, Satira Anis, MD  vitamin C (ASCORBIC ACID) 500 MG tablet Take 500 mg by mouth daily. AM   Yes [provider]     Family History  Problem Relation Age of Onset  . Stroke  Mother   . Alzheimer's disease Mother   . Hypertension Sister   . Hyperlipidemia Sister   . Hypertension Brother   . Hyperlipidemia Brother   . Diabetes Brother   . Cancer Sister        lung and colon    Social History   Socioeconomic History  . Marital status: Married    Spouse name: None  . Number of children: None  . Years of education: None  . Highest education level: None  Social Needs  . Financial resource strain: None  . Food insecurity - worry: None  . Food insecurity - inability: None  . Transportation needs - medical: None  . Transportation needs - non-medical: None  Occupational History  . None  Tobacco Use  . Smoking status: Former Smoker    Packs/day: 1.00    Years: 40.00    Pack years: 40.00    Types: Cigarettes    Last attempt to quit: 12/09/1998    Years since quitting: 18.6  . Smokeless  tobacco: Never Used  Substance and Sexual Activity  . Alcohol use: No  . Drug use: No  . Sexual activity: Yes  Other Topics Concern  . None  Social History Narrative  . None     Review of Systems: A 12 point ROS discussed and pertinent positives are indicated in the HPI above.  All other systems are negative.  Review of Systems  Vital Signs: BP 140/79 (BP Location: Right Arm)   Pulse (!) 56   Temp (!) 97.5 F (36.4 C) (Oral)   Resp 16   SpO2 99%   Physical Exam  Constitutional: He is oriented to person, place, and time. He appears well-developed and well-nourished. No distress.  HENT:  Head: Normocephalic.  Mouth/Throat: Oropharynx is clear and moist.  Neck: Normal range of motion. No JVD present.  Cardiovascular: Normal rate, regular rhythm, normal heart sounds and intact distal pulses.  Pulmonary/Chest: Effort normal and breath sounds normal. No respiratory distress.  Abdominal: Soft. There is no tenderness. There is no guarding.  Neurological: He is alert and oriented to person, place, and time.  Skin: Skin is warm and dry.  Psychiatric: He has a  normal mood and affect.      Imaging: Ct Chest W Contrast  Result Date: 06/27/2017 CLINICAL DATA:  77 year old male with history of lung cancer status post left upper lobectomy. Follow-up study. EXAM: CT CHEST WITH CONTRAST TECHNIQUE: Multidetector CT imaging of the chest was performed during intravenous contrast administration. CONTRAST:  42m ISOVUE-300 IOPAMIDOL (ISOVUE-300) INJECTION 61% COMPARISON:  Multiple priors, most recently chest CT 06/25/2016. FINDINGS: Cardiovascular: Heart size is normal. There is no significant pericardial fluid, thickening or pericardial calcification. There is aortic atherosclerosis, as well as atherosclerosis of the great vessels of the mediastinum and the coronary arteries, including calcified atherosclerotic plaque in the left main coronary arteries. Mediastinum/Nodes: No pathologically enlarged mediastinal or hilar lymph nodes. Esophagus is unremarkable in appearance. No axillary lymphadenopathy. Lungs/Pleura: Status post left upper lobectomy. Compensatory hyperexpansion of the left lower lobe. No suspicious appearing pulmonary nodules or masses are noted. Calcified granuloma in the posterior aspect of the right upper lobe incidentally noted. Mild diffuse bronchial wall thickening with mild centrilobular and paraseptal emphysema. No acute consolidative airspace disease. No pleural effusions. Upper Abdomen: Small partially calcified gallstones lying dependently in the gallbladder measuring up to 6 mm. Aortic atherosclerosis. Musculoskeletal: There are no aggressive appearing lytic or blastic lesions noted in the visualized portions of the skeleton. IMPRESSION: 1. Status post left upper lobectomy. No findings to suggest local recurrence or metastatic disease in the thorax on today's examination. 2. Aortic atherosclerosis, in addition to left main coronary artery disease. Assessment for potential risk factor modification, dietary therapy or pharmacologic therapy may be  warranted, if clinically indicated. 3. Mild diffuse bronchial wall thickening with mild centrilobular and paraseptal emphysema; imaging findings suggestive of underlying COPD. 4. Cholelithiasis. Aortic Atherosclerosis (ICD10-I70.0) and Emphysema (ICD10-J43.9). Electronically Signed   By: DVinnie LangtonM.D.   On: 06/27/2017 15:37   Mr Liver W Wo Contrast  Result Date: 06/27/2017 CLINICAL DATA:  Metastatic neuroendocrine tumor of the left upper lobe. EXAM: MRI ABDOMEN WITHOUT AND WITH CONTRAST TECHNIQUE: Multiplanar multisequence MR imaging of the abdomen was performed both before and after the administration of intravenous contrast. CONTRAST:  15 cc MultiHance COMPARISON:  MRI 12/03/2016 and 09/07/2016 FINDINGS: Examination is limited due to inability to hold his breath. Lower chest: The lung bases are grossly clear. Hepatobiliary: Interval significant enlargement of the segment 3  liver lesion. It measures 4.3 x 3.9 cm and previously measured 3.3 x 2.3 cm. Although the study is limited there appears to be a new lesion in the lateral aspect of segment 8 measuring approximately 13.5 mm. I suspect there is also a new lesion in segment 6 measuring 8 mm. Pancreas:  No mass, inflammation or ductal dilatation. Spleen:  Stable 15 mm lesion, likely benign hemangioma. Adrenals/Urinary Tract: The adrenal glands and kidneys are grossly normal and stable. The left kidney demonstrates renal cortical thinning and atrophy. Stomach/Bowel: Visualized portions within the abdomen are unremarkable. Vascular/Lymphatic: No pathologically enlarged lymph nodes identified. No abdominal aortic aneurysm demonstrated. Other:  No ascites or abdominal wall hernia. Musculoskeletal: No significant bony findings. IMPRESSION: 1. Enlarging segment 3 liver lesion now measuring 4.3 x 3.9 cm and previously measuring 3.3 x 2.3 cm. Findings consistent with metastatic neuroendocrine tumor. 2. Suspect new lesions in segment 8 and segment 6. PET CT with  Dotatate may be helpful for for further evaluation, if clinically necessary. 3. No abdominal lymphadenopathy. 4. Stable benign-appearing splenic lesion, likely hemangioma. Electronically Signed   By: Marijo Sanes M.D.   On: 06/27/2017 15:12   Ir Radiologist Eval & Mgmt  Result Date: 07/07/2017 Please refer to notes tab for details about interventional procedure. (Op Note)   Labs:  CBC: Recent Labs    04/06/17 1132 05/05/17 1315 06/30/17 1420 07/26/17 0748  WBC 7.7 9.9 9.4 11.3*  HGB 15.2 15.6 15.0 16.0  HCT 45.3 46.5 45.0 49.1  PLT 177 190 186 222    COAGS: Recent Labs    12/17/16 1423 07/26/17 0748  INR 1.0 1.02  APTT 30  --     BMP: Recent Labs    04/06/17 1132 05/05/17 1315 06/27/17 1313 06/30/17 1420 07/26/17 0748  NA 140 141  --  139 140  K 4.9 4.9  --  4.7 4.0  CL 105 104  --  105 102  CO2 28 30  --  29 26  GLUCOSE 114* 96  --  96 127*  BUN 23* 19  --  17 26*  CALCIUM 9.9 9.7  --  9.4 9.8  CREATININE 0.91 1.01 0.90 1.23 0.99  GFRNONAA >60 >60  --  56* >60  GFRAA >60 >60  --  >60 >60    LIVER FUNCTION TESTS: Recent Labs    04/06/17 1132 05/05/17 1315 06/30/17 1420 07/26/17 0748  BILITOT 1.1 0.9 0.7 0.9  AST _0 ALT _1 ALKPHOS 106 115 98 116  PROT 7.3 7.4 6.9 8.0  ALBUMIN 4.3 4.3 4.1 4.6    TUMOR MARKERS: Recent Labs    03/02/17 1124 04/06/17 1132 05/05/17 1315 06/30/17 1420  CHROMGRNA 6* 7* 11* 10*    Assessment and Plan: Metastatic neuroendocrine tumor to the liver For angiogram with possible branch embolization today, followed by test dosing radioisotope. Labs ok Risks and benefits of visceral angiogram were discussed with the patient including, but not limited to bleeding, infection, vascular injury or contrast induced renal failure.  This interventional procedure involves the use of X-rays and because of the nature of the planned procedure, it is possible that we will have prolonged use of X-ray  fluoroscopy.  Potential radiation risks to you include (but are not limited to) the following: - A slightly elevated risk for cancer  several years later in life. This risk is typically less than 0.5% percent. This risk is low in comparison to the normal incidence  of human cancer, which is 33% for women and 50% for men according to the [place]. - Radiation induced injury can include skin redness, resembling a rash, tissue breakdown / ulcers and hair loss (which can be temporary or permanent).   The likelihood of either of these occurring depends on the difficulty of the procedure and whether you are sensitive to radiation due to previous procedures, disease, or genetic conditions.   IF your procedure requires a prolonged use of radiation, you will be notified and given written instructions for further action.  It is your responsibility to monitor the irradiated area for the 2 weeks following the procedure and to notify your physician if you are concerned that you have suffered a radiation induced injury.    All of the patient's questions were answered, patient is agreeable to proceed.  Consent signed and in chart.   Thank you for this interesting consult.  I greatly enjoyed meeting Sherill W Asebedo and look forward to participating in their care.  A copy of this report was sent to the requesting provider on this date.  Electronically Signed: Ascencion Dike, PA-C 07/26/2017, 8:54 AM   I spent a total of 20 minutes in face to face in clinical consultation, greater than 50% of which was counseling/coordinating care for neuroendocrine tumor

## 2017-07-26 NOTE — Progress Notes (Signed)
Pt is now 4 hours post-procedure. Pt up at side of bed with ambulation noted. Pt denies any pain and no swelling to right groin insertion site noted.

## 2017-07-26 NOTE — Procedures (Signed)
Met neuro endocrine tumor  S/p pre Y90 hepatic angio, GDA embo, and Lt hepatic MAA injection  No comp Stable EBL 0 Full report in pacs

## 2017-07-28 ENCOUNTER — Other Ambulatory Visit: Payer: Self-pay

## 2017-07-28 ENCOUNTER — Encounter: Payer: Self-pay | Admitting: Oncology

## 2017-07-28 ENCOUNTER — Inpatient Hospital Stay: Payer: Medicare Other | Attending: Oncology | Admitting: *Deleted

## 2017-07-28 ENCOUNTER — Inpatient Hospital Stay: Payer: Medicare Other

## 2017-07-28 ENCOUNTER — Inpatient Hospital Stay (HOSPITAL_BASED_OUTPATIENT_CLINIC_OR_DEPARTMENT_OTHER): Payer: Medicare Other | Admitting: Oncology

## 2017-07-28 VITALS — BP 134/79 | HR 60 | Temp 97.8°F | Wt 159.8 lb

## 2017-07-28 DIAGNOSIS — Z902 Acquired absence of lung [part of]: Secondary | ICD-10-CM | POA: Insufficient documentation

## 2017-07-28 DIAGNOSIS — C7B02 Secondary carcinoid tumors of liver: Secondary | ICD-10-CM | POA: Insufficient documentation

## 2017-07-28 DIAGNOSIS — C7A8 Other malignant neuroendocrine tumors: Secondary | ICD-10-CM | POA: Insufficient documentation

## 2017-07-28 DIAGNOSIS — M8580 Other specified disorders of bone density and structure, unspecified site: Secondary | ICD-10-CM | POA: Insufficient documentation

## 2017-07-28 DIAGNOSIS — Z79899 Other long term (current) drug therapy: Secondary | ICD-10-CM | POA: Insufficient documentation

## 2017-07-28 DIAGNOSIS — N400 Enlarged prostate without lower urinary tract symptoms: Secondary | ICD-10-CM | POA: Diagnosis not present

## 2017-07-28 DIAGNOSIS — I4891 Unspecified atrial fibrillation: Secondary | ICD-10-CM | POA: Insufficient documentation

## 2017-07-28 DIAGNOSIS — I10 Essential (primary) hypertension: Secondary | ICD-10-CM | POA: Diagnosis not present

## 2017-07-28 DIAGNOSIS — Z87891 Personal history of nicotine dependence: Secondary | ICD-10-CM | POA: Insufficient documentation

## 2017-07-28 DIAGNOSIS — I4892 Unspecified atrial flutter: Secondary | ICD-10-CM | POA: Insufficient documentation

## 2017-07-28 DIAGNOSIS — E785 Hyperlipidemia, unspecified: Secondary | ICD-10-CM | POA: Insufficient documentation

## 2017-07-28 LAB — COMPREHENSIVE METABOLIC PANEL
ALBUMIN: 4 g/dL (ref 3.5–5.0)
ALT: 18 U/L (ref 17–63)
ANION GAP: 10 (ref 5–15)
AST: 20 U/L (ref 15–41)
Alkaline Phosphatase: 109 U/L (ref 38–126)
BILIRUBIN TOTAL: 1 mg/dL (ref 0.3–1.2)
BUN: 22 mg/dL — ABNORMAL HIGH (ref 6–20)
CO2: 24 mmol/L (ref 22–32)
Calcium: 9.3 mg/dL (ref 8.9–10.3)
Chloride: 104 mmol/L (ref 101–111)
Creatinine, Ser: 1.05 mg/dL (ref 0.61–1.24)
GFR calc non Af Amer: >60 mL/min (ref >60)
Glucose, Bld: 150 mg/dL — ABNORMAL HIGH (ref 65–99)
POTASSIUM: 4.4 mmol/L (ref 3.5–5.1)
SODIUM: 138 mmol/L (ref 135–145)
TOTAL PROTEIN: 7.3 g/dL (ref 6.5–8.1)

## 2017-07-28 LAB — CBC WITH DIFFERENTIAL/PLATELET
BASOS ABS: 0.1 10*3/uL (ref 0–0.1)
Basophils Relative: 1 %
EOS ABS: 0.3 10*3/uL (ref 0–0.7)
Eosinophils Relative: 2 %
HEMATOCRIT: 41.8 % (ref 40.0–52.0)
HEMOGLOBIN: 14.2 g/dL (ref 13.0–18.0)
LYMPHS PCT: 7 %
Lymphs Abs: 1 10*3/uL (ref 1.0–3.6)
MCH: 30.7 pg (ref 26.0–34.0)
MCHC: 34 g/dL (ref 32.0–36.0)
MCV: 90.2 fL (ref 80.0–100.0)
MONOS PCT: 16 %
Monocytes Absolute: 2.2 10*3/uL — ABNORMAL HIGH (ref 0.2–1.0)
NEUTROS PCT: 74 %
Neutro Abs: 10.4 10*3/uL — ABNORMAL HIGH (ref 1.4–6.5)
Platelets: 193 10*3/uL (ref 150–440)
RBC: 4.63 MIL/uL (ref 4.40–5.90)
RDW: 14 % (ref 11.5–14.5)
WBC: 14 10*3/uL — ABNORMAL HIGH (ref 3.8–10.6)

## 2017-07-28 MED ORDER — LANREOTIDE ACETATE 120 MG/0.5ML ~~LOC~~ SOLN
120.0000 mg | Freq: Once | SUBCUTANEOUS | Status: AC
  Administered 2017-07-28: 120 mg via SUBCUTANEOUS
  Filled 2017-07-28: qty 120

## 2017-07-29 LAB — CHROMOGRANIN A: Chromogranin A: 22 nmol/L — ABNORMAL HIGH (ref 0–5)

## 2017-08-09 ENCOUNTER — Other Ambulatory Visit: Payer: Self-pay | Admitting: Radiology

## 2017-08-11 ENCOUNTER — Encounter (HOSPITAL_COMMUNITY): Payer: Self-pay

## 2017-08-11 ENCOUNTER — Other Ambulatory Visit (HOSPITAL_COMMUNITY): Payer: Self-pay | Admitting: Interventional Radiology

## 2017-08-11 ENCOUNTER — Ambulatory Visit (HOSPITAL_COMMUNITY)
Admission: RE | Admit: 2017-08-11 | Discharge: 2017-08-11 | Disposition: A | Discharge status: Home / Self Care | Payer: Medicare Other | Source: Ambulatory Visit | Attending: Interventional Radiology | Admitting: Interventional Radiology

## 2017-08-11 ENCOUNTER — Encounter (HOSPITAL_COMMUNITY)
Admission: RE | Admit: 2017-08-11 | Discharge: 2017-08-11 | Disposition: A | Discharge status: Home / Self Care | Payer: Medicare Other | Source: Ambulatory Visit | Attending: Interventional Radiology | Admitting: Interventional Radiology

## 2017-08-11 DIAGNOSIS — I10 Essential (primary) hypertension: Secondary | ICD-10-CM | POA: Diagnosis not present

## 2017-08-11 DIAGNOSIS — M8580 Other specified disorders of bone density and structure, unspecified site: Secondary | ICD-10-CM | POA: Insufficient documentation

## 2017-08-11 DIAGNOSIS — Z888 Allergy status to other drugs, medicaments and biological substances status: Secondary | ICD-10-CM | POA: Diagnosis not present

## 2017-08-11 DIAGNOSIS — Z87891 Personal history of nicotine dependence: Secondary | ICD-10-CM | POA: Insufficient documentation

## 2017-08-11 DIAGNOSIS — C7B8 Other secondary neuroendocrine tumors: Secondary | ICD-10-CM

## 2017-08-11 DIAGNOSIS — Z79899 Other long term (current) drug therapy: Secondary | ICD-10-CM | POA: Diagnosis not present

## 2017-08-11 DIAGNOSIS — E785 Hyperlipidemia, unspecified: Secondary | ICD-10-CM | POA: Diagnosis not present

## 2017-08-11 DIAGNOSIS — C220 Liver cell carcinoma: Secondary | ICD-10-CM | POA: Diagnosis not present

## 2017-08-11 DIAGNOSIS — Z85110 Personal history of malignant carcinoid tumor of bronchus and lung: Secondary | ICD-10-CM | POA: Insufficient documentation

## 2017-08-11 DIAGNOSIS — C787 Secondary malignant neoplasm of liver and intrahepatic bile duct: Secondary | ICD-10-CM | POA: Diagnosis not present

## 2017-08-11 HISTORY — PX: IR ANGIOGRAM SELECTIVE EACH ADDITIONAL VESSEL: IMG667

## 2017-08-11 HISTORY — PX: IR EMBO TUMOR ORGAN ISCHEMIA INFARCT INC GUIDE ROADMAPPING: IMG5449

## 2017-08-11 HISTORY — PX: IR US GUIDE VASC ACCESS RIGHT: IMG2390

## 2017-08-11 HISTORY — PX: IR ANGIOGRAM VISCERAL SELECTIVE: IMG657

## 2017-08-11 LAB — COMPREHENSIVE METABOLIC PANEL
ALT: 16 U/L — ABNORMAL LOW (ref 17–63)
AST: 24 U/L (ref 15–41)
Albumin: 4.2 g/dL (ref 3.5–5.0)
Alkaline Phosphatase: 118 U/L (ref 38–126)
Anion gap: 10 (ref 5–15)
BUN: 22 mg/dL — AB (ref 6–20)
CHLORIDE: 108 mmol/L (ref 101–111)
CO2: 25 mmol/L (ref 22–32)
Calcium: 9.5 mg/dL (ref 8.9–10.3)
Creatinine, Ser: 1 mg/dL (ref 0.61–1.24)
Glucose, Bld: 137 mg/dL — ABNORMAL HIGH (ref 65–99)
POTASSIUM: 4.2 mmol/L (ref 3.5–5.1)
Sodium: 143 mmol/L (ref 135–145)
Total Bilirubin: 0.8 mg/dL (ref 0.3–1.2)
Total Protein: 7.7 g/dL (ref 6.5–8.1)

## 2017-08-11 LAB — CBC WITH DIFFERENTIAL/PLATELET
BASOS ABS: 0.1 10*3/uL (ref 0.0–0.1)
BASOS PCT: 1 %
EOS PCT: 3 %
Eosinophils Absolute: 0.3 10*3/uL (ref 0.0–0.7)
HCT: 47.1 % (ref 39.0–52.0)
Hemoglobin: 15.6 g/dL (ref 13.0–17.0)
LYMPHS PCT: 12 %
Lymphs Abs: 1.2 10*3/uL (ref 0.7–4.0)
MCH: 31.1 pg (ref 26.0–34.0)
MCHC: 33.1 g/dL (ref 30.0–36.0)
MCV: 93.8 fL (ref 78.0–100.0)
MONO ABS: 1.2 10*3/uL — AB (ref 0.1–1.0)
Monocytes Relative: 12 %
Neutro Abs: 7.1 10*3/uL (ref 1.7–7.7)
Neutrophils Relative %: 72 %
PLATELETS: 231 10*3/uL (ref 150–400)
RBC: 5.02 MIL/uL (ref 4.22–5.81)
RDW: 14.3 % (ref 11.5–15.5)
WBC: 9.9 10*3/uL (ref 4.0–10.5)

## 2017-08-11 LAB — PROTIME-INR
INR: 0.94
PROTHROMBIN TIME: 12.5 s (ref 11.4–15.2)

## 2017-08-11 MED ORDER — FENTANYL CITRATE (PF) 100 MCG/2ML IJ SOLN
INTRAMUSCULAR | Status: AC | PRN
  Administered 2017-08-11 (×2): 50 ug via INTRAVENOUS

## 2017-08-11 MED ORDER — NITROGLYCERIN IN D5W 100-5 MCG/ML-% IV SOLN
INTRAVENOUS | Status: AC
  Filled 2017-08-11: qty 250

## 2017-08-11 MED ORDER — PIPERACILLIN-TAZOBACTAM 3.375 G IVPB
3.3750 g | Freq: Once | INTRAVENOUS | Status: AC
  Administered 2017-08-11: 3.375 g via INTRAVENOUS

## 2017-08-11 MED ORDER — YTTRIUM 90 INJECTION: 15.3000 | INJECTION | Freq: Once | INTRAVENOUS | Status: DC

## 2017-08-11 MED ORDER — MIDAZOLAM HCL 2 MG/2ML IJ SOLN
INTRAMUSCULAR | Status: AC | PRN
  Administered 2017-08-11 (×2): 1 mg via INTRAVENOUS

## 2017-08-11 MED ORDER — IOPAMIDOL (ISOVUE-300) INJECTION 61%
100.0000 mL | Freq: Once | INTRAVENOUS | Status: AC | PRN
Start: 2017-08-11 — End: 2017-08-11
  Administered 2017-08-11: 32 mL via INTRAVENOUS

## 2017-08-11 MED ORDER — DEXAMETHASONE SODIUM PHOSPHATE 10 MG/ML IJ SOLN
8.0000 mg | Freq: Once | INTRAMUSCULAR | Status: AC
  Administered 2017-08-11: 8 mg via INTRAVENOUS
  Filled 2017-08-11: qty 1

## 2017-08-11 MED ORDER — MIDAZOLAM HCL 2 MG/2ML IJ SOLN
INTRAMUSCULAR | Status: AC
  Filled 2017-08-11: qty 6

## 2017-08-11 MED ORDER — IOPAMIDOL (ISOVUE-300) INJECTION 61%
INTRAVENOUS | Status: AC
  Filled 2017-08-11: qty 200

## 2017-08-11 MED ORDER — SODIUM CHLORIDE 0.9 % IV SOLN
INTRAVENOUS | Status: DC
  Administered 2017-08-11: 08:00:00 via INTRAVENOUS

## 2017-08-11 MED ORDER — PANTOPRAZOLE SODIUM 40 MG IV SOLR
40.0000 mg | Freq: Once | INTRAVENOUS | Status: AC
  Administered 2017-08-11: 40 mg via INTRAVENOUS
  Filled 2017-08-11: qty 40

## 2017-08-11 MED ORDER — PIPERACILLIN-TAZOBACTAM 3.375 G IVPB
INTRAVENOUS | Status: AC
  Administered 2017-08-11: 3.375 g via INTRAVENOUS
  Filled 2017-08-11: qty 50

## 2017-08-11 MED ORDER — IOPAMIDOL (ISOVUE-300) INJECTION 61%
100.0000 mL | Freq: Once | INTRAVENOUS | Status: AC | PRN
Start: 2017-08-11 — End: 2017-08-11
  Administered 2017-08-11: 65 mL via INTRAVENOUS

## 2017-08-11 MED ORDER — NITROGLYCERIN 1 MG/10 ML FOR IR/CATH LAB
INTRA_ARTERIAL | Status: AC | PRN
  Administered 2017-08-11: 100 ug via INTRA_ARTERIAL

## 2017-08-11 MED ORDER — LIDOCAINE HCL (PF) 1 % IJ SOLN
INTRAMUSCULAR | Status: AC | PRN
  Administered 2017-08-11: 10 mL

## 2017-08-11 MED ORDER — FENTANYL CITRATE (PF) 100 MCG/2ML IJ SOLN
INTRAMUSCULAR | Status: AC
  Filled 2017-08-11: qty 4

## 2017-08-11 MED ORDER — LIDOCAINE HCL 1 % IJ SOLN
INTRAMUSCULAR | Status: AC
  Filled 2017-08-11: qty 20

## 2017-08-11 MED ORDER — ONDANSETRON HCL 4 MG/2ML IJ SOLN
4.0000 mg | Freq: Once | INTRAMUSCULAR | Status: AC
  Administered 2017-08-11: 4 mg via INTRAVENOUS
  Filled 2017-08-11: qty 2

## 2017-08-11 NOTE — Procedures (Signed)
Met neuroendocrine tumor to the liver  S/p left hepatic Y90 radioemboliztion  No comp Stable EBL min Full report in pacs

## 2017-08-11 NOTE — Sedation Documentation (Signed)
Transferred to SS 12

## 2017-08-11 NOTE — Discharge Instructions (Signed)
Moderate Conscious Sedation, Adult, Care After These instructions provide you with information about caring for yourself after your procedure. Your health care provider may also give you more specific instructions. Your treatment has been planned according to current medical practices, but problems sometimes occur. Call your health care provider if you have any problems or questions after your procedure. What can I expect after the procedure? After your procedure, it is common:  To feel sleepy for several hours.  To feel clumsy and have poor balance for several hours.  To have poor judgment for several hours.  To vomit if you eat too soon.  Follow these instructions at home: For at least 24 hours after the procedure:   Do not: ? Participate in activities where you could fall or become injured. ? Drive. ? Use heavy machinery. ? Drink alcohol. ? Take sleeping pills or medicines that cause drowsiness. ? Make important decisions or sign legal documents. ? Take care of children on your own.  Rest. Eating and drinking  Follow the diet recommended by your health care provider.  If you vomit: ? Drink water, juice, or soup when you can drink without vomiting. ? Make sure you have little or no nausea before eating solid foods. General instructions  Have a responsible adult stay with you until you are awake and alert.  Take over-the-counter and prescription medicines only as told by your health care provider.  If you smoke, do not smoke without supervision.  Keep all follow-up visits as told by your health care provider. This is important. Contact a health care provider if:  You keep feeling nauseous or you keep vomiting.  You feel light-headed.  You develop a rash.  You have a fever. Get help right away if:  You have trouble breathing. This information is not intended to replace advice given to you by your health care provider. Make sure you discuss any questions you have  with your health care provider. Document Released: 02/14/2013 Document Revised: 09/29/2015 Document Reviewed: 08/16/2015 Elsevier Interactive Patient Education  2018 [place] Radioembolization Discharge Instructions  You have been given a radioactive material during your procedure.  While it is safe for you to be discharged home from the hospital, you need to proceed directly home.    Do not use public transportation, including air travel, lasting more than 2 hours for 1 week.  Avoid crowded public places for 1 week.  Adult visitors should try to avoid close contact with you for 1 week.    Children and pregnant females should not visit or have close contact with you for 1 week.  Items that you touch are not radioactive.  Do not sleep in the same bed as your partner for 1 week, and a condom should be used for sexual activity during the first 24 hours.  Your blood may be radioactive and caution should be used if any bleeding occurs during the recovery period.  Body fluids may be radioactive for 24 hours.  Wash your hands after voiding.  Men should sit to urinate.  Dispose of any soiled materials (flush down toilet or place in trash at home) during the first day.  Drink 6 to 8 glasses of fluids per day for 5 days to hydrate yourself.  If you need to see a doctor during the first week, you must let them know that you were treated with [id number] microspheres, and will be slightly radioactive.  They can call Interventional Radiology [phone] with any  questions.   Hepatic Artery Radioembolization, Care After This sheet gives you information about how to care for yourself after your procedure. Your health care provider may also give you more specific instructions. If you have problems or questions, contact your health care provider. What can I expect after the procedure? After the procedure, it is common to have:  A slight fever for 1-2 weeks. If your fever gets worse,  tell your health care provider.  Fatigue.  Loss of appetite. This should gradually improve after about 1 week.  Abdominal pain on your right side.  Soreness and tenderness in your groin area where the needle and catheter were placed (puncture site).  Follow these instructions at home: Puncture site care  Follow instructions from your health care provider about how to take care of the puncture site. Make sure you: ? Wash your hands with soap and water before you change your bandage (dressing). If soap and water are not available, use hand sanitizer. ? Change your dressing as told by your health care provider.  You may remove your dressing tomorrow. ? Leave stitches (sutures), skin glue, or adhesive strips in place. These skin closures may need to stay in place for 2 weeks or longer. If adhesive strip edges start to loosen and curl up, you may trim the loose edges. Do not remove adhesive strips completely unless your health care provider tells you to do that.  Check your puncture site every day for signs of infection. Check for: ? More redness, swelling, or pain. ? More fluid or blood. ? Warmth. ? Pus or a bad smell. Activity  Rest and return to your normal activities as told by your health care provider. Ask your health care provider what activities are safe for you.  Do not drive for 24 hours after the procedure if you were given a medicine to help you relax (sedative).  Do not lift anything that is heavier than 10 lb (4.5 kg) for 1 week or until your health care provider says that it is safe. Medicines  Take over-the-counter and prescription medicines only as told by your health care provider.  Do not drive or use heavy machinery while taking prescription pain medicine. Radiation precautions  For up to a week after your procedure, there will be a small amount of radioactivity near your liver. This is not especially dangerous to other people. However, you should follow these  precautions for 7 days: ? Do not come in close contact with people. ? Do not sleep in the same bed as someone else. ? Do not hold children or babies. ? Do not have contact with pregnant women. General instructions   To prevent or treat constipation while you are taking prescription pain medicine, your health care provider may recommend that you: ? Drink enough fluid to keep your urine clear or pale yellow. ? Take over-the-counter or prescription medicines. ? Eat foods that are high in fiber, such as fresh fruits and vegetables, whole grains, and beans. ? Limit foods that are high in fat and processed sugars, such as fried and sweet foods.  Eat frequent small meals until your appetite returns. Follow instructions from your health care provider about eating or drinking restrictions.  Do not take baths, swim, or use a hot tub until your health care provider approves. You may take showers. Wash your puncture site with mild soap and water and pat the area dry.  You may shower tomorrow.  Keep all follow-up visits as told by your  health care provider. This is important. You may need to have blood tests and imaging tests done. Contact a health care provider if:  You have more redness, swelling, or pain around your puncture site.  You have more fluid or blood coming from your puncture site.  Your puncture site feels warm to the touch.  You have pus or a bad smell coming from your puncture site.  You have pain that: ? Gets worse. ? Does not get better with medicine. ? Feels like very bad heartburn. ? Is in the middle of your abdomen, above your belly button.  Your skin and the white parts of your eyes turn yellow (jaundice).  The color of your urine changes to dark brown.  The color of your stool changes to light yellow.  Your abdominal measurement (girth) increases in a short period of time.  You gain more than 5 lb (2.3 kg) in a short period of time. Get help right away if:  You  have a fever that lasts longer than 2 weeks or is higher than what your health care provider told you to expect.  You develop any of the following in your legs: ? Pain. ? Swelling. ? Skin that is cold or pale or turns blue.  You have chest pain.  You have blood in your vomit, saliva, or stool.  You have trouble breathing. This information is not intended to replace advice given to you by your health care provider. Make sure you discuss any questions you have with your health care provider. Document Released: 05/01/2013 Document Revised: 01/24/2016 Document Reviewed: 01/24/2016 Elsevier Interactive Patient Education  [employer].

## 2017-08-11 NOTE — Sedation Documentation (Signed)
[id number] transferred to nuc med for imaging study.

## 2017-08-11 NOTE — H&P (Signed)
Chief Complaint: Patient was seen in consultation today for Y-90   Referring Physician(s): Shick,Michael  Supervising Physician: Daryll Brod  Patient Status: [facility] - Out-pt  History of Present Illness: Jason Malone is a 77 y.o. male with stage 4 met well differentiated neuroendocrine tumor to the liver.  He has undergone pre-Y90 roadmapping with Dr. Annamaria Boots on 07/26/17.  He presents today to treatment of his enlarging left lobe liver lesion.  He presents today in his usual state of health.  He has been NPO.  He does not take blood thinners.    Past Medical History:  Diagnosis Date  . Allergic rhinitis   . Benign prostatic hypertrophy with lower urinary tract symptoms (LUTS)   . Chest pain    a. 05/2013 MV: EF 70%, no ischemia.  . Diverticulitis    DIVERTICULOSIS  . Enlarged prostate   . Essential hypertension    CONTROLLED ON MEDS  . Full dentures    upper and lower  . H/O hypokalemia   . Hx of atrial fibrillation without current medication    CARDIOLOGIST-DR [facility]  . 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 Hyperlipemia   . Hypomagnesemia   . IFG (impaired fasting glucose)   . Liver cancer ([place])   . Liver cancer ([place]) 12/08/2016  . Liver mass, left lobe 08/28/2016   Probably hemangioma; will get MRI of liver, refer to GI  . Lung cancer ([place])    a. carcinoid, left lung, Stage 1b (T2a, N0, cM0);  b. 05/2013 s/p VATS & LULobectomy.  . Monocytosis   . Osteopenia   . Paroxysmal atrial flutter ([facility])    a. 03/2013->no recurrence;  b. CHA2DS2VASc = 2-->not currently on anticoagulation;  c. 05/2012 Echo: EF 55-60%, normal RV.  [name] Wears glasses     Past Surgical History:  Procedure Laterality Date  . BRONCHOSCOPY     04/06/2013  . CARDIOVASCULAR STRESS TEST  05/2013   a. No evidence of ischemia or infarct, EF 70%, no WMAs  . CATARACT EXTRACTION W/PHACO Left 07/14/2015   Procedure: CATARACT EXTRACTION PHACO AND INTRAOCULAR LENS PLACEMENT  (IOC);  Surgeon: [name], MD;  Location: [place];  Service: Ophthalmology;  Laterality: Left;  . CATARACT EXTRACTION W/PHACO Right 10/01/2015   Procedure: CATARACT EXTRACTION PHACO AND INTRAOCULAR LENS PLACEMENT (IOC);  Surgeon: [name], MD;  Location: [place];  Service: Ophthalmology;  Laterality: Right;  . COLONOSCOPY W/ POLYPECTOMY     bleed after-had to go to surgery to stop bleeding via colonoscopy  . COLONOSCOPY WITH PROPOFOL N/A 11/19/2015   Procedure: COLONOSCOPY WITH PROPOFOL;  Surgeon: [name], MD;  Location: [facility] ENDOSCOPY;  Service: Endoscopy;  Laterality: N/A;  . DUPUYTREN CONTRACTURE RELEASE  12/15/2011   Procedure: DUPUYTREN CONTRACTURE RELEASE;  Surgeon: [name], MD;  Location: [place];  Service: Orthopedics;  Laterality: Left;  Fasciotomy left ring finger dupuytrens  . IR ANGIOGRAM SELECTIVE EACH ADDITIONAL VESSEL  07/26/2017  . IR ANGIOGRAM SELECTIVE EACH ADDITIONAL VESSEL  07/26/2017  . IR ANGIOGRAM SELECTIVE EACH ADDITIONAL VESSEL  07/26/2017  . IR ANGIOGRAM SELECTIVE EACH ADDITIONAL VESSEL  07/26/2017  . IR ANGIOGRAM SELECTIVE EACH ADDITIONAL VESSEL  07/26/2017  . IR ANGIOGRAM VISCERAL SELECTIVE  07/26/2017  . IR EMBO ARTERIAL NOT HEMORR HEMANG INC GUIDE ROADMAPPING  07/26/2017  . IR RADIOLOGIST EVAL & MGMT  07/07/2017  . IR US GUIDE VASC ACCESS RIGHT  07/26/2017  . LUNG LOBECTOMY  06/10/13   upper left lung  . MULTIPLE TOOTH EXTRACTIONS    . REMOVAL RETAINED LENS Right 11/17/2015   Procedure: REMOVAL RETAINED LENS FROAGMENTS RIGHT EYE;  Surgeon: [name], MD;  Location: [place];  Service: Ophthalmology;  Laterality: Right;  . TONSILLECTOMY    . UPPER GASTROINTESTINAL ENDOSCOPY  11-03-15   Dr [name]  . VASECTOMY    . VIDEO ASSISTED THORACOSCOPY (VATS)/WEDGE RESECTION Left 06/04/2013   Procedure: VIDEO ASSISTED THORACOSCOPY (VATS)/WEDGE RESECTION;  Surgeon: [name], MD;   Location: [place];  Service: Thoracic;  Laterality: Left;  [name] VIDEO BRONCHOSCOPY N/A 06/04/2013   Procedure: VIDEO BRONCHOSCOPY;  Surgeon: [name], MD;  Location: [facility] OR;  Service: Thoracic;  Laterality: N/A;    Allergies: Latex and Tape  Medications: Prior to Admission medications   Medication Sig Start Date End Date Taking? Authorizing Provider  amLODipine (NORVASC) 2.5 MG tablet TAKE ONE TABLET BY MOUTH ONCE DAILY 04/11/17  Yes Gollan, Kathlene November, MD  atorvastatin (LIPITOR) 40 MG tablet TAKE 1 TABLET BY MOUTH AT BEDTIME FOR CHOLESTEROL 02/28/17  Yes Lada, Satira Anis, MD  b complex vitamins capsule Take 1 capsule by mouth daily. AM   Yes [provider]  Calcium Carbonate-Vitamin D3 (CALCIUM 600/VITAMIN D) 600-400 MG-UNIT TABS Take by mouth daily.   Yes [provider]  carvedilol (COREG) 3.125 MG tablet TAKE ONE TABLET BY MOUTH ONCE DAILY IN AM 02/28/17  Yes Gollan, Kathlene November, MD  Cinnamon 500 MG capsule Take 500 mg by mouth 2 (two) times daily. AM AND BEDTIME   Yes [provider]  finasteride (PROSCAR) 5 MG tablet TAKE ONE TABLET BY MOUTH ONCE DAILY 10/05/16  Yes Lada, Satira Anis, MD  loratadine (CLARITIN) 10 MG tablet Take 10 mg by mouth daily. AM   Yes [provider]  losartan (COZAAR) 100 MG tablet TAKE ONE TABLET BY MOUTH ONCE DAILY 11/30/16  Yes Gollan, Kathlene November, MD  Multiple Vitamins-Minerals (MULTIVITAMIN WITH MINERALS) tablet Take 1 tablet by mouth daily. AM   Yes [provider]  polycarbophil (FIBERCON) 625 MG tablet Take 625 mg by mouth daily. AM   Yes [provider]  polyethylene glycol (MIRALAX / GLYCOLAX) packet Take 17 g by mouth daily.   Yes [provider]  tamsulosin (FLOMAX) 0.4 MG CAPS capsule TAKE ONE CAPSULE BY MOUTH ONCE DAILY 04/26/17  Yes Lada, Satira Anis, MD  vitamin C (ASCORBIC ACID) 500 MG tablet Take 500 mg by mouth daily. AM   Yes [provider]     Family History  Problem Relation  Age of Onset  . Stroke Mother   . Alzheimer's disease Mother   . Hypertension Sister   . Hyperlipidemia Sister   . Hypertension Brother   . Hyperlipidemia Brother   . Diabetes Brother   . Cancer Sister        lung and colon    Social History   Socioeconomic History  . Marital status: Married    Spouse name: Not on file  . Number of children: Not on file  . Years of education: Not on file  . Highest education level: Not on file  Occupational History  . Not on file  Social Needs  . Financial resource strain: Not on file  . Food insecurity:    Worry: Not on file    Inability: Not on file  . Transportation needs:    Medical: Not on file    Non-medical: Not on file  Tobacco Use  . Smoking status: Former Smoker    Packs/day: 1.00    Years: 40.00    Pack years: 40.00    Types: Cigarettes    Last attempt to quit: 12/09/1998    Years since quitting: 18.6  . Smokeless tobacco: Never Used  Substance and Sexual Activity  . Alcohol use: No  . Drug use: No  . Sexual activity: Yes  Lifestyle  . Physical activity:    Days per week: Not on file    Minutes per session: Not on file  . Stress: Not on file  Relationships  . Social connections:    Talks on phone: Not on file    Gets together: Not on file    Attends religious service: Not on file    Active member of club or organization: Not on file    Attends meetings of clubs or organizations: Not on file    Relationship status: Not on file  Other Topics Concern  . Not on file  Social History Narrative  . Not on file     Review of Systems: A 12 point ROS discussed and pertinent positives are indicated in the HPI above.  All other systems are negative.  Review of Systems  Constitutional: Negative for fatigue and fever.  Respiratory: Negative for cough and shortness of breath.   Cardiovascular: Negative for chest pain.  Gastrointestinal: Negative for abdominal pain.  Psychiatric/Behavioral: Negative for behavioral  problems and confusion.    Vital Signs: BP (!) 163/76 (BP Location: Right Arm)   Pulse (!) 51   Temp 97.7 F (36.5 C) (Oral)   Resp 16   SpO2 99%   Physical Exam  Constitutional: He is oriented to person, place, and time. He appears well-developed and well-nourished. No distress.  HENT:  Head: Normocephalic.  Mouth/Throat: Oropharynx is clear and moist.  Cardiovascular: Normal rate, regular rhythm, normal heart sounds and intact distal pulses.  Pulmonary/Chest: Effort normal and breath sounds normal. No respiratory distress.  Abdominal: Soft. There is no tenderness.  Neurological: He is alert and oriented to person, place, and time.  Skin: Skin is warm and dry.  Psychiatric: He has a normal mood and affect. His behavior is normal. Judgment and thought content normal.  Nursing note and vitals reviewed.     Imaging: Nm Liver Img Spect  Result Date: 07/27/2017 CLINICAL DATA:  Metastatic neuroendocrine tumor to the liver. EXAM: NUCLEAR MEDICINE LIVER SCAN; ULTRASOUND MISCELLANEOUS SOFT TISSUE TECHNIQUE: Abdominal images were obtained in multiple projections after intrahepatic arterial injection of radiopharmaceutical. SPECT imaging was performed. Lung shunt calculation was performed. RADIOPHARMACEUTICALS:  5.71mllicurie MAA TECHNETIUM TO 32M ALBUMIN AGGREGATED COMPARISON:  MRI 12/03/2016, 06/27/2017 FINDINGS: The injected microaggregated albumin localizes within the liver. No evidence of activity within the stomach, duodenum, or bowel. Calculated shunt fraction to the lungs equals 10.1%. IMPRESSION: 1. No significant extrahepatic radiotracer activity following intrahepatic arterial injection of MAA. 2. Lung shunt fraction equals 10.1% Electronically Signed   By: SSuzy BouchardM.D.   On: 07/27/2017 07:54   Ir Angiogram Visceral Selective  Result Date: 07/26/2017 INDICATION: METASTATIC NEUROENDOCRINE TUMOR TO THE LIVER EXAM: ULTRASOUND GUIDANCE FOR VASCULAR ACCESS SELECTIVE  CATHETERIZATIONS AND ANGIOGRAMS OF THE CELIAC, COMMON HEPATIC, PROPER HEPATIC, RIGHT HEPATIC, GASTRODUODENAL ARTERY, AND LEFT HEPATIC ARTERIES SUCCESSFUL MICRO COIL EMBOLIZATION OF THE GASTRODUODENAL ARTERY LEFT HEPATIC ARTERY TECHNETIUM MAA INJECTION FOR HEPATIC PULMONARY SHUNT CALCULATION MEDICATIONS: 3.375 G ZOSYN. The antibiotic was administered within 1 hour of the procedure ANESTHESIA/SEDATION: Moderate (conscious) sedation was  employed during this procedure. A total of Versed 3.0 mg and Fentanyl 100 mcg was administered intravenously. Moderate Sedation Time: 58 minutes. The patient's level of consciousness and vital signs were monitored continuously by radiology nursing throughout the procedure under my direct supervision. CONTRAST:  98 CC ISOVUE-300 FLUOROSCOPY TIME:  Fluoroscopy Time: 14 minutes 0 seconds (898 mGy). COMPLICATIONS: None immediate. PROCEDURE: Informed consent was obtained from the patient following explanation of the procedure, risks, benefits and alternatives. The patient understands, agrees and consents for the procedure. All questions were addressed. A time out was performed prior to the initiation of the procedure. Maximal barrier sterile technique utilized including caps, mask, sterile gowns, sterile gloves, large sterile drape, hand hygiene, and Betadine prep. Under sterile conditions and local anesthesia, ultrasound micropuncture access performed the right common femoral artery. Images obtained for documentation. Five fr sheath inserted over a Bentson guidewire. Initially a C2 catheter was utilized to select the celiac artery. Celiac angiogram performed. Celiac: Celiac is widely patent including its branches. The splenic, left gastric, hepatic vasculature all patent. Because of angulation, catheter exchanged for a Chung 2.5 catheter which was utilized to reselect the celiac origin. Renegade STC microcatheter over a single angle GT Glidewire utilized to select the common hepatic artery.  Common hepatic angiogram performed. Common hepatic: Common hepatic, gastroduodenal, proper hepatic, right and left hepatic arteries are all patent. Catheter was advanced into the proper hepatic artery. Proper hepatic angiogram performed. Proper hepatic: The proper, left, and right hepatic vasculature all patent. Very small right gastric artery noted off of the proper hepatic artery. Over the guidewire, Renegade STC catheter was advanced to the right hepatic artery. Right hepatic: Right hepatic artery is widely patent. With delayed imaging, there are 2 areas vascular blushing peripherally in the right hepatic lobe suspicious for right hepatic metastases. Catheter was retracted and utilized to select the gastroduodenal artery: GDA: GDA is widely patent as well as the pancreatic branches and the gastroepiploic artery. GDA embolization: Several 4 and 5 mm interlock micro coils were deployed to occlude the gastroduodenal artery successfully to prevent non target Y 90 embolization within this vascular territory. Renegade STC catheter was utilized over a double angled Glidewire to select the left hepatic artery. Left hepatic: Left hepatic artery is widely patent. Faint tumor blushing present within the left hepatic lobe lateral segment correlating with the dominant left hepatic metastasis. This access position appears safe for Y 90 radio embolization. Left hepatic technetium MAA injection: 5 millicuries technetium MAA injected into left hepatic artery simulating the initial treatment and to calculate the hepatic pulmonary shunt. Access removed. Hemostasis obtained with a ExoSeal device. No immediate complication. Patient tolerated the procedure well. Catheter and guidewire requirements: Chung 2.5, Renegade STC, single and double angle GT 018 glide wires IMPRESSION: Successful pre Y 90 hepatic angiograms for vascular anatomy mapping prior to initial left hepatic Y 90 radio embolization. Successful gastroduodenal artery  micro coil embolization to prevent non target Y 90 embolization Successful left hepatic technetium MAA injection for hepatic pulmonary shunt calculation Electronically Signed   By: Jerilynn Mages.  Shick M.D.   On: 07/26/2017 12:37   Ir Angiogram Selective Each Additional Vessel  Result Date: 07/26/2017 INDICATION: METASTATIC NEUROENDOCRINE TUMOR TO THE LIVER EXAM: ULTRASOUND GUIDANCE FOR VASCULAR ACCESS SELECTIVE CATHETERIZATIONS AND ANGIOGRAMS OF THE CELIAC, COMMON HEPATIC, PROPER HEPATIC, RIGHT HEPATIC, GASTRODUODENAL ARTERY, AND LEFT HEPATIC ARTERIES SUCCESSFUL MICRO COIL EMBOLIZATION OF THE GASTRODUODENAL ARTERY LEFT HEPATIC ARTERY TECHNETIUM MAA INJECTION FOR HEPATIC PULMONARY SHUNT CALCULATION MEDICATIONS: 3.375 G ZOSYN.  The antibiotic was administered within 1 hour of the procedure ANESTHESIA/SEDATION: Moderate (conscious) sedation was employed during this procedure. A total of Versed 3.0 mg and Fentanyl 100 mcg was administered intravenously. Moderate Sedation Time: 58 minutes. The patient's level of consciousness and vital signs were monitored continuously by radiology nursing throughout the procedure under my direct supervision. CONTRAST:  98 CC ISOVUE-300 FLUOROSCOPY TIME:  Fluoroscopy Time: 14 minutes 0 seconds (898 mGy). COMPLICATIONS: None immediate. PROCEDURE: Informed consent was obtained from the patient following explanation of the procedure, risks, benefits and alternatives. The patient understands, agrees and consents for the procedure. All questions were addressed. A time out was performed prior to the initiation of the procedure. Maximal barrier sterile technique utilized including caps, mask, sterile gowns, sterile gloves, large sterile drape, hand hygiene, and Betadine prep. Under sterile conditions and local anesthesia, ultrasound micropuncture access performed the right common femoral artery. Images obtained for documentation. Five fr sheath inserted over a Bentson guidewire. Initially a C2  catheter was utilized to select the celiac artery. Celiac angiogram performed. Celiac: Celiac is widely patent including its branches. The splenic, left gastric, hepatic vasculature all patent. Because of angulation, catheter exchanged for a Chung 2.5 catheter which was utilized to reselect the celiac origin. Renegade STC microcatheter over a single angle GT Glidewire utilized to select the common hepatic artery. Common hepatic angiogram performed. Common hepatic: Common hepatic, gastroduodenal, proper hepatic, right and left hepatic arteries are all patent. Catheter was advanced into the proper hepatic artery. Proper hepatic angiogram performed. Proper hepatic: The proper, left, and right hepatic vasculature all patent. Very small right gastric artery noted off of the proper hepatic artery. Over the guidewire, Renegade STC catheter was advanced to the right hepatic artery. Right hepatic: Right hepatic artery is widely patent. With delayed imaging, there are 2 areas vascular blushing peripherally in the right hepatic lobe suspicious for right hepatic metastases. Catheter was retracted and utilized to select the gastroduodenal artery: GDA: GDA is widely patent as well as the pancreatic branches and the gastroepiploic artery. GDA embolization: Several 4 and 5 mm interlock micro coils were deployed to occlude the gastroduodenal artery successfully to prevent non target Y 90 embolization within this vascular territory. Renegade STC catheter was utilized over a double angled Glidewire to select the left hepatic artery. Left hepatic: Left hepatic artery is widely patent. Faint tumor blushing present within the left hepatic lobe lateral segment correlating with the dominant left hepatic metastasis. This access position appears safe for Y 90 radio embolization. Left hepatic technetium MAA injection: 5 millicuries technetium MAA injected into left hepatic artery simulating the initial treatment and to calculate the hepatic  pulmonary shunt. Access removed. Hemostasis obtained with a ExoSeal device. No immediate complication. Patient tolerated the procedure well. Catheter and guidewire requirements: Chung 2.5, Renegade STC, single and double angle GT 018 glide wires IMPRESSION: Successful pre Y 90 hepatic angiograms for vascular anatomy mapping prior to initial left hepatic Y 90 radio embolization. Successful gastroduodenal artery micro coil embolization to prevent non target Y 90 embolization Successful left hepatic technetium MAA injection for hepatic pulmonary shunt calculation Electronically Signed   By: Jerilynn Mages.  Shick M.D.   On: 07/26/2017 12:37   Ir Angiogram Selective Each Additional Vessel  Result Date: 07/26/2017 INDICATION: METASTATIC NEUROENDOCRINE TUMOR TO THE LIVER EXAM: ULTRASOUND GUIDANCE FOR VASCULAR ACCESS SELECTIVE CATHETERIZATIONS AND ANGIOGRAMS OF THE CELIAC, COMMON HEPATIC, PROPER HEPATIC, RIGHT HEPATIC, GASTRODUODENAL ARTERY, AND LEFT HEPATIC ARTERIES SUCCESSFUL MICRO COIL EMBOLIZATION OF THE GASTRODUODENAL ARTERY  LEFT HEPATIC ARTERY TECHNETIUM MAA INJECTION FOR HEPATIC PULMONARY SHUNT CALCULATION MEDICATIONS: 3.375 G ZOSYN. The antibiotic was administered within 1 hour of the procedure ANESTHESIA/SEDATION: Moderate (conscious) sedation was employed during this procedure. A total of Versed 3.0 mg and Fentanyl 100 mcg was administered intravenously. Moderate Sedation Time: 58 minutes. The patient's level of consciousness and vital signs were monitored continuously by radiology nursing throughout the procedure under my direct supervision. CONTRAST:  98 CC ISOVUE-300 FLUOROSCOPY TIME:  Fluoroscopy Time: 14 minutes 0 seconds (898 mGy). COMPLICATIONS: None immediate. PROCEDURE: Informed consent was obtained from the patient following explanation of the procedure, risks, benefits and alternatives. The patient understands, agrees and consents for the procedure. All questions were addressed. A time out was performed prior  to the initiation of the procedure. Maximal barrier sterile technique utilized including caps, mask, sterile gowns, sterile gloves, large sterile drape, hand hygiene, and Betadine prep. Under sterile conditions and local anesthesia, ultrasound micropuncture access performed the right common femoral artery. Images obtained for documentation. Five fr sheath inserted over a Bentson guidewire. Initially a C2 catheter was utilized to select the celiac artery. Celiac angiogram performed. Celiac: Celiac is widely patent including its branches. The splenic, left gastric, hepatic vasculature all patent. Because of angulation, catheter exchanged for a Chung 2.5 catheter which was utilized to reselect the celiac origin. Renegade STC microcatheter over a single angle GT Glidewire utilized to select the common hepatic artery. Common hepatic angiogram performed. Common hepatic: Common hepatic, gastroduodenal, proper hepatic, right and left hepatic arteries are all patent. Catheter was advanced into the proper hepatic artery. Proper hepatic angiogram performed. Proper hepatic: The proper, left, and right hepatic vasculature all patent. Very small right gastric artery noted off of the proper hepatic artery. Over the guidewire, Renegade STC catheter was advanced to the right hepatic artery. Right hepatic: Right hepatic artery is widely patent. With delayed imaging, there are 2 areas vascular blushing peripherally in the right hepatic lobe suspicious for right hepatic metastases. Catheter was retracted and utilized to select the gastroduodenal artery: GDA: GDA is widely patent as well as the pancreatic branches and the gastroepiploic artery. GDA embolization: Several 4 and 5 mm interlock micro coils were deployed to occlude the gastroduodenal artery successfully to prevent non target Y 90 embolization within this vascular territory. Renegade STC catheter was utilized over a double angled Glidewire to select the left hepatic artery.  Left hepatic: Left hepatic artery is widely patent. Faint tumor blushing present within the left hepatic lobe lateral segment correlating with the dominant left hepatic metastasis. This access position appears safe for Y 90 radio embolization. Left hepatic technetium MAA injection: 5 millicuries technetium MAA injected into left hepatic artery simulating the initial treatment and to calculate the hepatic pulmonary shunt. Access removed. Hemostasis obtained with a ExoSeal device. No immediate complication. Patient tolerated the procedure well. Catheter and guidewire requirements: Chung 2.5, Renegade STC, single and double angle GT 018 glide wires IMPRESSION: Successful pre Y 90 hepatic angiograms for vascular anatomy mapping prior to initial left hepatic Y 90 radio embolization. Successful gastroduodenal artery micro coil embolization to prevent non target Y 90 embolization Successful left hepatic technetium MAA injection for hepatic pulmonary shunt calculation Electronically Signed   By: Jerilynn Mages.  Shick M.D.   On: 07/26/2017 12:37   Ir Angiogram Selective Each Additional Vessel  Result Date: 07/26/2017 INDICATION: METASTATIC NEUROENDOCRINE TUMOR TO THE LIVER EXAM: ULTRASOUND GUIDANCE FOR VASCULAR ACCESS SELECTIVE CATHETERIZATIONS AND ANGIOGRAMS OF THE CELIAC, COMMON HEPATIC, PROPER HEPATIC, RIGHT  HEPATIC, GASTRODUODENAL ARTERY, AND LEFT HEPATIC ARTERIES SUCCESSFUL MICRO COIL EMBOLIZATION OF THE GASTRODUODENAL ARTERY LEFT HEPATIC ARTERY TECHNETIUM MAA INJECTION FOR HEPATIC PULMONARY SHUNT CALCULATION MEDICATIONS: 3.375 G ZOSYN. The antibiotic was administered within 1 hour of the procedure ANESTHESIA/SEDATION: Moderate (conscious) sedation was employed during this procedure. A total of Versed 3.0 mg and Fentanyl 100 mcg was administered intravenously. Moderate Sedation Time: 58 minutes. The patient's level of consciousness and vital signs were monitored continuously by radiology nursing throughout the procedure under  my direct supervision. CONTRAST:  98 CC ISOVUE-300 FLUOROSCOPY TIME:  Fluoroscopy Time: 14 minutes 0 seconds (898 mGy). COMPLICATIONS: None immediate. PROCEDURE: Informed consent was obtained from the patient following explanation of the procedure, risks, benefits and alternatives. The patient understands, agrees and consents for the procedure. All questions were addressed. A time out was performed prior to the initiation of the procedure. Maximal barrier sterile technique utilized including caps, mask, sterile gowns, sterile gloves, large sterile drape, hand hygiene, and Betadine prep. Under sterile conditions and local anesthesia, ultrasound micropuncture access performed the right common femoral artery. Images obtained for documentation. Five fr sheath inserted over a Bentson guidewire. Initially a C2 catheter was utilized to select the celiac artery. Celiac angiogram performed. Celiac: Celiac is widely patent including its branches. The splenic, left gastric, hepatic vasculature all patent. Because of angulation, catheter exchanged for a Chung 2.5 catheter which was utilized to reselect the celiac origin. Renegade STC microcatheter over a single angle GT Glidewire utilized to select the common hepatic artery. Common hepatic angiogram performed. Common hepatic: Common hepatic, gastroduodenal, proper hepatic, right and left hepatic arteries are all patent. Catheter was advanced into the proper hepatic artery. Proper hepatic angiogram performed. Proper hepatic: The proper, left, and right hepatic vasculature all patent. Very small right gastric artery noted off of the proper hepatic artery. Over the guidewire, Renegade STC catheter was advanced to the right hepatic artery. Right hepatic: Right hepatic artery is widely patent. With delayed imaging, there are 2 areas vascular blushing peripherally in the right hepatic lobe suspicious for right hepatic metastases. Catheter was retracted and utilized to select the  gastroduodenal artery: GDA: GDA is widely patent as well as the pancreatic branches and the gastroepiploic artery. GDA embolization: Several 4 and 5 mm interlock micro coils were deployed to occlude the gastroduodenal artery successfully to prevent non target Y 90 embolization within this vascular territory. Renegade STC catheter was utilized over a double angled Glidewire to select the left hepatic artery. Left hepatic: Left hepatic artery is widely patent. Faint tumor blushing present within the left hepatic lobe lateral segment correlating with the dominant left hepatic metastasis. This access position appears safe for Y 90 radio embolization. Left hepatic technetium MAA injection: 5 millicuries technetium MAA injected into left hepatic artery simulating the initial treatment and to calculate the hepatic pulmonary shunt. Access removed. Hemostasis obtained with a ExoSeal device. No immediate complication. Patient tolerated the procedure well. Catheter and guidewire requirements: Chung 2.5, Renegade STC, single and double angle GT 018 glide wires IMPRESSION: Successful pre Y 90 hepatic angiograms for vascular anatomy mapping prior to initial left hepatic Y 90 radio embolization. Successful gastroduodenal artery micro coil embolization to prevent non target Y 90 embolization Successful left hepatic technetium MAA injection for hepatic pulmonary shunt calculation Electronically Signed   By: Jerilynn Mages.  Shick M.D.   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 The antibiotic was administered within 1 hour of the procedure ANESTHESIA/SEDATION: Moderate (conscious) sedation was employed during this procedure. A total of Versed 3.0 mg and Fentanyl 100 mcg was administered intravenously. Moderate Sedation Time: 58 minutes. The patient's level of consciousness and vital signs were monitored continuously by radiology nursing throughout the procedure under my direct supervision. CONTRAST:  98 CC ISOVUE-300 FLUOROSCOPY TIME:  Fluoroscopy Time: 14 minutes 0 seconds (898 mGy). COMPLICATIONS: None immediate. PROCEDURE: Informed consent was obtained from the patient following explanation of the procedure, risks, benefits and alternatives. The patient understands, agrees and consents for the procedure. All questions were addressed. A time out was performed prior to the initiation of the procedure. Maximal barrier sterile technique utilized including caps, mask, sterile gowns, sterile gloves, large sterile drape, hand hygiene, and Betadine prep. Under sterile conditions and local anesthesia, ultrasound micropuncture access performed the right common femoral artery. Images obtained for documentation. Five fr sheath inserted over a Bentson guidewire. Initially a C2 catheter was utilized to select the celiac artery. Celiac angiogram performed. Celiac: Celiac is widely patent including its branches. The splenic, left gastric, hepatic vasculature all patent. Because of angulation, catheter exchanged for a Chung 2.5 catheter which was utilized to reselect the celiac origin. Renegade STC microcatheter over a single angle GT Glidewire utilized to select the common hepatic artery. Common hepatic angiogram performed. Common hepatic: Common hepatic, gastroduodenal, proper hepatic, right and left hepatic arteries are all patent. Catheter was advanced into the proper hepatic artery. Proper hepatic angiogram performed. Proper hepatic: The proper, left, and right hepatic vasculature  all patent. Very small right gastric artery noted off of the proper hepatic artery. Over the guidewire, Renegade STC catheter was advanced to the right hepatic artery. Right hepatic: Right hepatic artery is widely patent. With delayed imaging, there are 2 areas vascular blushing peripherally in the right hepatic lobe suspicious for right hepatic metastases. Catheter was retracted and utilized to select the gastroduodenal artery: GDA: GDA is widely patent as well as the pancreatic branches and the gastroepiploic artery. GDA embolization: Several 4 and 5 mm interlock micro coils were deployed to occlude the gastroduodenal artery successfully to prevent non target Y 90 embolization within this vascular territory. Renegade STC catheter was utilized over a double angled Glidewire to select the left hepatic artery. Left hepatic: Left hepatic artery is widely patent. Faint tumor blushing present within the left hepatic lobe lateral segment correlating with the dominant left hepatic metastasis. This access position appears safe for Y 90 radio embolization. Left hepatic technetium MAA injection: 5 millicuries technetium MAA injected into left hepatic artery simulating the initial treatment and to calculate the hepatic pulmonary shunt. Access removed. Hemostasis obtained with a ExoSeal device. No immediate complication. Patient tolerated the procedure well. Catheter and guidewire requirements: Chung 2.5, Renegade STC, single and double angle GT 018 glide wires IMPRESSION: Successful pre Y 90 hepatic angiograms for vascular anatomy mapping prior to initial left hepatic Y 90 radio embolization. Successful gastroduodenal artery micro coil embolization to prevent non target Y 90 embolization Successful left hepatic technetium MAA injection for hepatic pulmonary shunt calculation Electronically Signed   By: Jerilynn Mages.  Shick M.D.   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 The antibiotic was administered within 1 hour of the procedure ANESTHESIA/SEDATION: Moderate (conscious) sedation was employed during this procedure. A total of Versed 3.0 mg and Fentanyl 100 mcg was administered intravenously. Moderate Sedation Time: 58 minutes. The patient's level of consciousness and vital signs were monitored continuously by radiology nursing throughout the procedure under my direct supervision. CONTRAST:  98 CC ISOVUE-300 FLUOROSCOPY TIME:  Fluoroscopy Time: 14 minutes 0 seconds (898 mGy). COMPLICATIONS: None immediate. PROCEDURE: Informed consent was obtained from the patient following explanation of the procedure, risks, benefits and alternatives. The patient understands, agrees and consents for the procedure. All questions were addressed. A time out was performed prior to the initiation of the procedure. Maximal barrier sterile technique utilized including caps, mask, sterile gowns, sterile gloves, large sterile drape, hand hygiene, and Betadine prep. Under sterile conditions and local anesthesia, ultrasound micropuncture access performed the right common femoral artery. Images obtained for documentation. Five fr sheath inserted over a Bentson guidewire. Initially a C2 catheter was utilized to select the celiac artery. Celiac angiogram performed. Celiac: Celiac is widely patent including its branches. The splenic, left gastric, hepatic vasculature all patent. Because of angulation, catheter exchanged for a Chung 2.5 catheter which was utilized to reselect  the celiac origin. Renegade STC microcatheter over a single angle GT Glidewire utilized to select the common hepatic artery. Common hepatic angiogram performed. Common hepatic: Common hepatic, gastroduodenal, proper hepatic, right and left hepatic arteries are all patent. Catheter was advanced into the proper hepatic artery. Proper hepatic angiogram performed. Proper hepatic: The proper, left, and right hepatic vasculature all patent. Very small right gastric artery noted off of the proper hepatic artery. Over the guidewire, Renegade STC catheter was advanced to the right hepatic artery. Right hepatic: Right hepatic artery is widely patent. With delayed imaging, there are 2 areas vascular blushing peripherally in the right hepatic lobe suspicious for right hepatic metastases. Catheter was retracted and utilized to select the gastroduodenal artery: GDA: GDA is widely patent as well as the pancreatic branches and the gastroepiploic artery. GDA embolization: Several 4 and 5 mm interlock micro coils were deployed to occlude the gastroduodenal artery successfully to prevent non target Y 90 embolization within this vascular territory. Renegade STC catheter was utilized over a double angled Glidewire to select the left hepatic artery. Left hepatic: Left hepatic artery is widely patent. Faint tumor blushing present within the left hepatic lobe lateral segment correlating with the dominant left hepatic metastasis. This access position appears safe for Y 90 radio embolization. Left hepatic technetium MAA injection: 5 millicuries technetium MAA injected into left hepatic artery simulating the initial treatment and to calculate the hepatic pulmonary shunt. Access removed. Hemostasis obtained with a ExoSeal device. No immediate complication. Patient tolerated the procedure well. Catheter and guidewire requirements: Chung 2.5, Renegade STC, single and double angle GT 018 glide wires IMPRESSION: Successful pre Y 90 hepatic  angiograms for vascular anatomy mapping prior to initial left hepatic Y 90 radio embolization. Successful gastroduodenal artery micro coil embolization to prevent non target Y 90 embolization Successful left hepatic technetium MAA injection for hepatic pulmonary shunt calculation Electronically Signed   By:  M.  Shick M.D.   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 The antibiotic was administered within 1 hour of the procedure ANESTHESIA/SEDATION: Moderate (conscious) sedation was employed during this procedure. A total of Versed 3.0 mg and Fentanyl 100 mcg was administered intravenously. Moderate Sedation Time: 58 minutes. The patient's level of consciousness and vital signs were monitored continuously by radiology nursing throughout the procedure under my direct supervision. CONTRAST:  98 CC ISOVUE-300 FLUOROSCOPY TIME:  Fluoroscopy Time: 14 minutes 0 seconds (898 mGy). COMPLICATIONS: None immediate. PROCEDURE: Informed consent was obtained from the patient following explanation of the procedure, risks, benefits and alternatives. The patient understands, agrees and consents for the procedure. All questions were addressed. A time out was performed prior to the initiation of the procedure. Maximal barrier sterile technique utilized including caps, mask, sterile gowns, sterile gloves, large sterile drape, hand hygiene, and Betadine prep. Under sterile conditions and local anesthesia, ultrasound micropuncture access performed the right common femoral  artery. Images obtained for documentation. Five fr sheath inserted over a Bentson guidewire. Initially a C2 catheter was utilized to select the celiac artery. Celiac angiogram performed. Celiac: Celiac is widely patent including its branches. The splenic, left gastric, hepatic vasculature all patent. Because of angulation, catheter exchanged for a Chung 2.5 catheter which was utilized to reselect the celiac origin. Renegade STC microcatheter over a single angle GT Glidewire utilized to select the common hepatic artery. Common hepatic angiogram performed. Common hepatic: Common hepatic, gastroduodenal, proper hepatic, right and left hepatic arteries are all patent. Catheter was advanced into the proper hepatic artery. Proper hepatic angiogram performed. Proper hepatic: The proper, left, and right hepatic vasculature all patent. Very small right gastric artery noted off of the proper hepatic artery. Over the guidewire, Renegade STC catheter was advanced to the right hepatic artery. Right hepatic: Right hepatic artery is widely patent. With delayed imaging, there are 2 areas vascular blushing peripherally in the right hepatic lobe suspicious for right hepatic metastases. Catheter was retracted and utilized to select the gastroduodenal artery: GDA: GDA is widely patent as well as the pancreatic branches and the gastroepiploic artery. GDA embolization: Several 4 and 5 mm interlock micro coils were deployed to occlude the gastroduodenal artery successfully to prevent non target Y 90 embolization within this vascular territory. Renegade STC catheter was utilized over a double angled Glidewire to select the left hepatic artery. Left hepatic: Left hepatic artery is widely patent. Faint tumor blushing present within the left hepatic lobe lateral segment correlating with the dominant left hepatic metastasis. This access position appears safe for Y 90 radio embolization. Left hepatic technetium MAA injection: 5 millicuries  technetium MAA injected into left hepatic artery simulating the initial treatment and to calculate the hepatic pulmonary shunt. Access removed. Hemostasis obtained with a ExoSeal device. No immediate complication. Patient tolerated the procedure well. Catheter and guidewire requirements: Chung 2.5, Renegade STC, single and double angle GT 018 glide wires IMPRESSION: Successful pre Y 90 hepatic angiograms for vascular anatomy mapping prior to initial left hepatic Y 90 radio embolization. Successful gastroduodenal artery micro coil embolization to prevent non target Y 90 embolization Successful  left hepatic technetium MAA injection for hepatic pulmonary shunt calculation Electronically Signed   By: Jerilynn Mages.  Shick M.D.   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 The antibiotic was administered within 1 hour of the procedure ANESTHESIA/SEDATION: Moderate (conscious) sedation was employed during this procedure. A total of Versed 3.0 mg and Fentanyl 100 mcg was administered intravenously. Moderate Sedation Time: 58 minutes. The patient's level of consciousness and vital signs were monitored continuously by radiology nursing throughout the procedure under my direct supervision. CONTRAST:  98 CC ISOVUE-300 FLUOROSCOPY TIME:  Fluoroscopy Time: 14 minutes 0 seconds (898 mGy). COMPLICATIONS: None immediate. PROCEDURE: Informed consent was obtained from the patient following explanation of the procedure, risks, benefits and alternatives. The  patient understands, agrees and consents for the procedure. All questions were addressed. A time out was performed prior to the initiation of the procedure. Maximal barrier sterile technique utilized including caps, mask, sterile gowns, sterile gloves, large sterile drape, hand hygiene, and Betadine prep. Under sterile conditions and local anesthesia, ultrasound micropuncture access performed the right common femoral artery. Images obtained for documentation. Five fr sheath inserted over a Bentson guidewire. Initially a C2 catheter was utilized to select the celiac artery. Celiac angiogram performed. Celiac: Celiac is widely patent including its branches. The splenic, left gastric, hepatic vasculature all patent. Because of angulation, catheter exchanged for a Chung 2.5 catheter which was utilized to reselect the celiac origin. Renegade STC microcatheter over a single angle GT Glidewire utilized to select the common hepatic artery. Common hepatic angiogram performed. Common hepatic: Common hepatic, gastroduodenal, proper hepatic, right and left hepatic arteries are all patent. Catheter was advanced into the proper hepatic artery. Proper hepatic angiogram performed. Proper hepatic: The proper, left, and right hepatic vasculature all patent. Very small right gastric artery noted off of the proper hepatic artery. Over the guidewire, Renegade STC catheter was advanced to the right hepatic artery. Right hepatic: Right hepatic artery is widely patent. With delayed imaging, there are 2 areas vascular blushing peripherally in the right hepatic lobe suspicious for right hepatic metastases. Catheter was retracted and utilized to select the gastroduodenal artery: GDA: GDA is widely patent as well as the pancreatic branches and the gastroepiploic artery. GDA embolization: Several 4 and 5 mm interlock micro coils were deployed to occlude the gastroduodenal artery successfully to prevent non target Y 90 embolization within this  vascular territory. Renegade STC catheter was utilized over a double angled Glidewire to select the left hepatic artery. Left hepatic: Left hepatic artery is widely patent. Faint tumor blushing present within the left hepatic lobe lateral segment correlating with the dominant left hepatic metastasis. This access position appears safe for Y 90 radio embolization. Left hepatic technetium MAA injection: 5 millicuries technetium MAA injected into left hepatic artery simulating the initial treatment and to calculate the hepatic pulmonary shunt. Access removed. Hemostasis obtained with a ExoSeal device. No immediate complication. Patient tolerated the procedure well. Catheter and guidewire requirements: Chung 2.5, Renegade STC, single and double angle GT 018 glide wires IMPRESSION: Successful pre Y 90 hepatic angiograms for vascular anatomy mapping prior to initial left hepatic  Y 90 radio embolization. Successful gastroduodenal artery micro coil embolization to prevent non target Y 90 embolization Successful left hepatic technetium MAA injection for hepatic pulmonary shunt calculation Electronically Signed   By: Jerilynn Mages.  Shick M.D.   On: 07/26/2017 12:37   Nm Fusion  Result Date: 07/27/2017 CLINICAL DATA:  Metastatic neuroendocrine tumor to the liver. EXAM: NUCLEAR MEDICINE LIVER SCAN; ULTRASOUND MISCELLANEOUS SOFT TISSUE TECHNIQUE: Abdominal images were obtained in multiple projections after intrahepatic arterial injection of radiopharmaceutical. SPECT imaging was performed. Lung shunt calculation was performed. RADIOPHARMACEUTICALS:  5.78mllicurie MAA TECHNETIUM TO 62M ALBUMIN AGGREGATED COMPARISON:  MRI 12/03/2016, 06/27/2017 FINDINGS: The injected microaggregated albumin localizes within the liver. No evidence of activity within the stomach, duodenum, or bowel. Calculated shunt fraction to the lungs equals 10.1%. IMPRESSION: 1. No significant extrahepatic radiotracer activity following intrahepatic arterial injection  of MAA. 2. Lung shunt fraction equals 10.1% Electronically Signed   By: SSuzy BouchardM.D.   On: 07/27/2017 07:54    Labs:  CBC: Recent Labs    06/30/17 1420 07/26/17 0748 07/28/17 1017 08/11/17 0738  WBC 9.4 11.3* 14.0* 9.9  HGB 15.0 16.0 14.2 15.6  HCT 45.0 49.1 41.8 47.1  PLT 186 222 193 231    COAGS: Recent Labs    12/17/16 1423 07/26/17 0748 08/11/17 0738  INR 1.0 1.02 0.94  APTT 30  --   --     BMP: Recent Labs    06/30/17 1420 07/26/17 0748 07/28/17 1017 08/11/17 0738  NA 139 140 138 143  K 4.7 4.0 4.4 4.2  CL 105 102 104 108  CO2 _0 GLUCOSE 96 127* 150* 137*  BUN 17 26* 22* 22*  CALCIUM 9.4 9.8 9.3 9.5  CREATININE 1.23 0.99 1.05 1.00  GFRNONAA 56* >60 >60 >60  GFRAA >60 >60 >60 >60    LIVER FUNCTION TESTS: Recent Labs    06/30/17 1420 07/26/17 0748 07/28/17 1017 08/11/17 0738  BILITOT 0.7 0.9 1.0 0.8  AST _1 ALT _2 16*  ALKPHOS 98 116 109 118  PROT 6.9 8.0 7.3 7.7  ALBUMIN 4.1 4.6 4.0 4.2    TUMOR MARKERS: Recent Labs    04/06/17 1132 05/05/17 1315 06/30/17 1420 07/28/17 1017  CHROMGRNA 7* 11* 10* 22*    Assessment and Plan: Patient with past medical history of neuroendocrine tumor with mets to the liver presents for Y90 treatment.  Patient has completed pre-Y90 roadmapping on 07/26/17. Patient presents today in their usual state of health.  He has been NPO and is not currently on blood thinners.  He understands the goals of therapy today as well as future possible treatment of his additional lesions.  Risks and benefits discussed with the patient including, but not limited to bleeding, infection, vascular injury, post procedural pain, nausea, vomiting and fatigue, contrast induced renal failure, liver failure, radiation injury to the bowel, radiation induced cholecystitis, neutropenia and possible need for additional procedures.  All of the patient's questions were answered, patient is agreeable to  proceed. Consent signed and in chart.  Thank you for this interesting consult.  I greatly enjoyed meeting Kamerin W CPeineand look forward to participating in their care.  A copy of this report was sent to the requesting provider on this date.  Electronically Signed: KDocia Barrier PA 08/11/2017, 8:53 AM   I spent a total of 20 minutes in face to face in clinical consultation, greater than 50% of which was counseling/coordinating care  for neuroendocrine tumor

## 2017-08-16 ENCOUNTER — Other Ambulatory Visit: Payer: Self-pay | Admitting: Family Medicine

## 2017-08-16 ENCOUNTER — Ambulatory Visit: Payer: Medicare Other | Admitting: Family Medicine

## 2017-08-16 ENCOUNTER — Other Ambulatory Visit (HOSPITAL_COMMUNITY): Payer: Self-pay | Admitting: Interventional Radiology

## 2017-08-16 DIAGNOSIS — C7A8 Other malignant neuroendocrine tumors: Secondary | ICD-10-CM

## 2017-08-16 NOTE — Telephone Encounter (Signed)
Last SGPT and lipids reviewed; Rx approved

## 2017-08-23 ENCOUNTER — Encounter (HOSPITAL_COMMUNITY): Payer: Self-pay | Admitting: Interventional Radiology

## 2017-08-26 ENCOUNTER — Ambulatory Visit (HOSPITAL_COMMUNITY): Payer: Medicare Other

## 2017-08-28 NOTE — Progress Notes (Signed)
[place]  [phone] Fax:([phone]) [phone]  ID: Jason Malone OB: 12-07-1940  MR#: [id number]  JSE#:[id number]  Patient Care Team: Arnetha Courser, MD as PCP - General (Family Medicine) [name], Forest Gleason, MD (General Surgery) Nestor Lewandowsky, MD as Referring Physician (Cardiothoracic Surgery) Minna Merritts, MD as Consulting Physician (Cardiology) [name], MD as Consulting Physician (Cardiothoracic Surgery) Lloyd Huger, MD as Consulting Physician (Oncology) Sanda Klein Satira Anis, MD as Attending Physician (Family Medicine)  CHIEF COMPLAINT: 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  INTERVAL HISTORY: Patient returns to clinic today for further evaluation and continuation of Lanreotide.  His most recent Y-90 injection was on August 11, 2017.  He had some increased nausea after procedure, but otherwise tolerated well.  He currently feels well and is asymptomatic. He does not complain of diarrhea today.  He has no neurologic complaints. He denies any recent fevers or illnesses. He has a fair appetite and denies weight loss. He denies any chest pain, shortness of breath, hemoptysis, or cough. He denies any abdominal pain. He has no nausea, vomiting, or constipation. He has no urinary complaints.  Patient offers no specific complaints today.    REVIEW OF SYSTEMS:   Review of Systems  Constitutional: Negative.  Negative for fever, malaise/fatigue and weight loss.  Respiratory: Negative.  Negative for cough, hemoptysis and shortness of breath.   Cardiovascular: Negative.  Negative for chest pain and leg swelling.  Gastrointestinal: Negative.  Negative for abdominal pain, diarrhea, nausea and vomiting.  Genitourinary: Negative.  Negative for dysuria.  Musculoskeletal: Negative.  Negative for back pain.  Skin: Negative.  Negative for rash.  Neurological: Negative.  Negative for dizziness, sensory change, focal weakness and  weakness.  Psychiatric/Behavioral: Negative.  The patient is not nervous/anxious.     As per HPI. Otherwise, a complete review of systems is negative.  PAST MEDICAL HISTORY: Past Medical History:  Diagnosis Date  . Allergic rhinitis   . Benign prostatic hypertrophy with lower urinary tract symptoms (LUTS)   . Chest pain    a. 05/2013 MV: EF 70%, no ischemia.  . Diverticulitis    DIVERTICULOSIS  . Enlarged prostate   . Essential hypertension    CONTROLLED ON MEDS  . Full dentures    upper and lower  . H/O hypokalemia   . Hx of atrial fibrillation without current medication    CARDIOLOGIST-DR [facility]  . 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 Hyperlipemia   . Hypomagnesemia   . IFG (impaired fasting glucose)   . Liver cancer ([place])   . Liver cancer ([place]) 12/08/2016  . Liver mass, left lobe 08/28/2016   Probably hemangioma; will get MRI of liver, refer to GI  . Lung cancer ([place])    a. carcinoid, left lung, Stage 1b (T2a, N0, cM0);  b. 05/2013 s/p VATS & LULobectomy.  . Monocytosis   . Osteopenia   . Paroxysmal atrial flutter ([facility])    a. 03/2013->no recurrence;  b. CHA2DS2VASc = 2-->not currently on anticoagulation;  c. 05/2012 Echo: EF 55-60%, normal RV.  [name] Wears glasses     PAST SURGICAL HISTORY: Past Surgical History:  Procedure Laterality Date  . BRONCHOSCOPY     04/06/2013  . CARDIOVASCULAR STRESS TEST  05/2013   a. No evidence of ischemia or infarct, EF 70%, no WMAs  . CATARACT EXTRACTION W/PHACO Left 07/14/2015   Procedure: CATARACT EXTRACTION PHACO AND INTRAOCULAR LENS PLACEMENT (IOC);  Surgeon: [name], MD;  Location: [place];  Service: Ophthalmology;  Laterality: Left;  . CATARACT EXTRACTION W/PHACO Right 10/01/2015   Procedure: CATARACT EXTRACTION PHACO AND INTRAOCULAR LENS PLACEMENT (IOC);  Surgeon: [name], MD;  Location: [place];  Service: Ophthalmology;  Laterality: Right;  .  COLONOSCOPY W/ POLYPECTOMY     bleed after-had to go to surgery to stop bleeding via colonoscopy  . COLONOSCOPY WITH PROPOFOL N/A 11/19/2015   Procedure: COLONOSCOPY WITH PROPOFOL;  Surgeon: [name], MD;  Location: [facility] ENDOSCOPY;  Service: Endoscopy;  Laterality: N/A;  . DUPUYTREN CONTRACTURE RELEASE  12/15/2011   Procedure: DUPUYTREN CONTRACTURE RELEASE;  Surgeon: [name], MD;  Location: [place];  Service: Orthopedics;  Laterality: Left;  Fasciotomy left ring finger dupuytrens  . IR ANGIOGRAM SELECTIVE EACH ADDITIONAL VESSEL  07/26/2017  . IR ANGIOGRAM SELECTIVE EACH ADDITIONAL VESSEL  07/26/2017  . IR ANGIOGRAM SELECTIVE EACH ADDITIONAL VESSEL  07/26/2017  . IR ANGIOGRAM SELECTIVE EACH ADDITIONAL VESSEL  07/26/2017  . IR ANGIOGRAM SELECTIVE EACH ADDITIONAL VESSEL  07/26/2017  . IR ANGIOGRAM SELECTIVE EACH ADDITIONAL VESSEL  08/11/2017  . IR ANGIOGRAM VISCERAL SELECTIVE  07/26/2017  . IR ANGIOGRAM VISCERAL SELECTIVE  08/11/2017  . IR EMBO ARTERIAL NOT HEMORR HEMANG INC GUIDE ROADMAPPING  07/26/2017  . IR EMBO TUMOR ORGAN ISCHEMIA INFARCT INC GUIDE ROADMAPPING  08/11/2017  . IR RADIOLOGIST EVAL & MGMT  07/07/2017  . IR US GUIDE VASC ACCESS RIGHT  07/26/2017  . IR US GUIDE VASC ACCESS RIGHT  08/11/2017  . LUNG LOBECTOMY  06/10/13   upper left lung  . MULTIPLE TOOTH EXTRACTIONS    . REMOVAL RETAINED LENS Right 11/17/2015   Procedure: REMOVAL RETAINED LENS FROAGMENTS RIGHT EYE;  Surgeon: [name], MD;  Location: [place];  Service: Ophthalmology;  Laterality: Right;  . TONSILLECTOMY    . UPPER GASTROINTESTINAL ENDOSCOPY  11-03-15   Dr [name]  . VASECTOMY    . VIDEO ASSISTED THORACOSCOPY (VATS)/WEDGE RESECTION Left 06/04/2013   Procedure: VIDEO ASSISTED THORACOSCOPY (VATS)/WEDGE RESECTION;  Surgeon: [name], MD;  Location: [place];  Service: Thoracic;  Laterality: Left;  [name] VIDEO BRONCHOSCOPY N/A 06/04/2013   Procedure: VIDEO BRONCHOSCOPY;  Surgeon:  [name], MD;  Location: [facility] OR;  Service: Thoracic;  Laterality: N/A;    FAMILY HISTORY: Family History  Problem Relation Age of Onset  . Stroke Mother   . Alzheimer's disease Mother   . Hypertension Sister   . Hyperlipidemia Sister   . Hypertension Brother   . Hyperlipidemia Brother   . Diabetes Brother   . Cancer Sister        lung and colon    ADVANCED DIRECTIVES (Y/N):  N  HEALTH MAINTENANCE: Social History   Tobacco Use  . Smoking status: Former Smoker    Packs/day: 1.00    Years: 40.00    Pack years: 40.00    Types: Cigarettes    Last attempt to quit: 12/09/1998    Years since quitting: 18.7  . Smokeless tobacco: Never Used  Substance Use Topics  . Alcohol use: No  . Drug use: No     Colonoscopy:  PAP:  Bone density:  Lipid panel:  Allergies  Allergen Reactions  . Latex Itching    Tape only- REACTED TO BANDAGE ON ABDOMEN  . Tape Itching    Surgical tapes     Current Outpatient Medications  Medication Sig Dispense Refill  . amLODipine (NORVASC) 2.5 MG  tablet TAKE ONE TABLET BY MOUTH ONCE DAILY 90 tablet 2  . atorvastatin (LIPITOR) 40 MG tablet TAKE 1 TABLET BY MOUTH AT BEDTIME FOR CHOLESTEROL 90 tablet 1  . b complex vitamins capsule Take 1 capsule by mouth daily. AM    . Calcium Carbonate-Vitamin D3 (CALCIUM 600/VITAMIN D) 600-400 MG-UNIT TABS Take by mouth daily.    . carvedilol (COREG) 3.125 MG tablet TAKE ONE TABLET BY MOUTH ONCE DAILY IN AM 90 tablet 3  . Cinnamon 500 MG capsule Take 500 mg by mouth 2 (two) times daily. AM AND BEDTIME    . finasteride (PROSCAR) 5 MG tablet TAKE ONE TABLET BY MOUTH ONCE DAILY 90 tablet 3  . loratadine (CLARITIN) 10 MG tablet Take 10 mg by mouth daily. AM    . losartan (COZAAR) 100 MG tablet TAKE ONE TABLET BY MOUTH ONCE DAILY 90 tablet 3  . Multiple Vitamins-Minerals (MULTIVITAMIN WITH MINERALS) tablet Take 1 tablet by mouth daily. AM    . pantoprazole (PROTONIX) 40 MG tablet   1  . polycarbophil  (FIBERCON) 625 MG tablet Take 625 mg by mouth daily. AM    . polyethylene glycol (MIRALAX / GLYCOLAX) packet Take 17 g by mouth daily.    . tamsulosin (FLOMAX) 0.4 MG CAPS capsule TAKE ONE CAPSULE BY MOUTH ONCE DAILY 90 capsule 3  . vitamin C (ASCORBIC ACID) 500 MG tablet Take 500 mg by mouth daily. AM     No current facility-administered medications for this visit.     OBJECTIVE: Vitals:   08/29/17 1034  BP: (!) 156/85  Pulse: (!) 55  Resp: 20  Temp: 97.8 F (36.6 C)  SpO2: 99%     Body mass index is 27.21 kg/m.    ECOG FS:0 - Asymptomatic  General: Well-developed, well-nourished, no acute distress. Eyes: Pink conjunctiva, anicteric sclera. Lungs: Clear to auscultation bilaterally. Heart: Regular rate and rhythm. No rubs, murmurs, or gallops. Abdomen: Soft, nontender, nondistended. No organomegaly noted, normoactive bowel sounds. Musculoskeletal: No edema, cyanosis, or clubbing. Neuro: Alert, answering all questions appropriately. Cranial nerves grossly intact. Skin: No rashes or petechiae noted. Psych: Normal affect.  LAB RESULTS:  Lab Results  Component Value Date   NA 139 08/29/2017   K 4.7 08/29/2017   CL 104 08/29/2017   CO2 26 08/29/2017   GLUCOSE 130 (H) 08/29/2017   BUN 17 08/29/2017   CREATININE 1.05 08/29/2017   CALCIUM 9.4 08/29/2017   PROT 6.8 08/29/2017   ALBUMIN 3.8 08/29/2017   AST 18 08/29/2017   ALT 14 (L) 08/29/2017   ALKPHOS 110 08/29/2017   BILITOT 0.5 08/29/2017   GFRNONAA >60 08/29/2017   GFRAA >60 08/29/2017    Lab Results  Component Value Date   WBC 10.3 08/29/2017   NEUTROABS 8.1 (H) 08/29/2017   HGB 14.0 08/29/2017   HCT 40.6 08/29/2017   MCV 90.4 08/29/2017   PLT 190 08/29/2017     STUDIES: Nm Liver Img Spect  Result Date: 08/11/2017 CLINICAL DATA:  Unresectable neuroendocrine tumor metastasis to the liver. First treatment to left lobe. EXAM: NUCLEAR MEDICINE SPECIAL MED RAD PHYSICS CONS; NUCLEAR MEDICINE RADIO PHARM THERAPY  INTRA ARTERIAL; NUCLEAR MEDICINE TREATMENT PROCEDURE; NUCLEAR MEDICINE LIVER SCAN TECHNIQUE: In conjunction with the interventional radiologist a Y- Microsphere dose was calculated utilizing body surface area formulation. Calculated dose equal 15.4 mCi. Pre therapy MAA liver SPECT scan and CTA were evaluated. Utilizing a microcatheter system, the left hepatic artery was selected and Y-90 microspheres were delivered in fractionated aliquots.  Radiopharmaceutical was delivered by the interventional radiologist and nuclear radiologist. The patient tolerated procedure well. No adverse effects were noted. Bremsstrahlung planar and SPECT imaging of the abdomen following intrahepatic arterial delivery of Y-90 microsphere was performed. RADIOPHARMACEUTICALS:  15.3  MCI Y-90 MICROSPHERES COMPARISON:  MR from 06/27/2017. FINDINGS: Y - 90 microspheres therapy as above. First therapy to the left hepatic lobe. Bremsstrahlung planar and SPECT imaging of the abdomen following intrahepatic arterial delivery of Y-1microsphere demonstrates radioactivity localized to the left hepatic lobe. No evidence of extrahepatic activity. IMPRESSION: Successful Y - 90 microsphere delivery for treatment of unresectable liver metastasis. First therapy to the left lobe. Bremssstrahlung scan demonstrates activity localized to left hepatic lobe with no extrahepatic activity identified. Electronically Signed   By: Kerby Moors M.D.   On: 08/11/2017 14:21   Ir Angiogram Visceral Selective  Addendum Date: 08/23/2017   ADDENDUM REPORT: 08/23/2017 10:42 ADDENDUM: To the PROCEDURE: Ultrasound micropuncture access performed of the patent right common femoral artery. Ultrasound images obtained for documentation of femoral artery patency. Electronically Signed   By: Jerilynn Mages.  Shick M.D.   On: 08/23/2017 10:42   Result Date: 08/23/2017 INDICATION: Metastatic neuroendocrine tumor to the liver EXAM: ULTRASOUND GUIDANCE FOR VASCULAR ACCESS SELECTIVE  CATHETERIZATION AND ANGIOGRAMS OF THE CELIAC AND LEFT HEPATIC ARTERY LEFT HEPATIC ARTERY Y 90 RADIO EMBOLIZATION MEDICATIONS: 3.375 G ZOSYN. The antibiotic was administered within 1 hour of the procedure ANESTHESIA/SEDATION: Moderate (conscious) sedation was employed during this procedure. A total of Versed 2.0 mg and Fentanyl 100 mcg was administered intravenously. Moderate Sedation Time: 45 minutes. The patient's level of consciousness and vital signs were monitored continuously by radiology nursing throughout the procedure under my direct supervision. CONTRAST:  50 CC ISOVUE FLUOROSCOPY TIME:  Fluoroscopy Time: 6 minutes 30 seconds (199 mGy). COMPLICATIONS: None immediate. PROCEDURE: Informed consent was obtained from the patient following explanation of the procedure, risks, benefits and alternatives. The patient understands, agrees and consents for the procedure. All questions were addressed. A time out was performed prior to the initiation of the procedure. Maximal barrier sterile technique utilized including caps, mask, sterile gowns, sterile gloves, large sterile drape, hand hygiene, and Betadine prep. under sterile conditions and local anesthesia, ultrasound micropuncture access performed the right common femoral artery. images obtained for documentation. five french sheath inserted over a bentson guidewire. chung 2.5 catheter advanced over guidewire and utilized to select the celiac origin. celiac angiogram performed. celiac: celiac is widely patent including its branches. the splenic, left gastric, hepatic vasculature all patent. there is previous coil embolization of the gastroduodenal artery. There is a very small right gastric artery noted off of the proper hepatic artery. Left and right hepatic vasculature patent. Tumor neovascularity and blushing noted in the left hepatic lobe to the dominant lesion. Renegade high flow microcatheter over a double angled Glidewire was advanced into the left hepatic  artery. Selective left hepatic angiogram performed. Left hepatic: Left hepatic artery is patent. Tumor vascularity and staining noted to the dominant lesion in the left hepatic lobe lateral segment correlating with the MRI scan. This is the dominant supply to the left lesion. Y 90 radio embolization: The entire dose of Y90 was successfully delivered into left hepatic artery under intermittent fluoroscopy. Stasis was not obtained. Of note, there is tumor staining during the radio embolization. Access removed. Hemostasis obtained with the ExoSeal device. No immediate complication. Patient tolerated the procedure well. IMPRESSION: Successful left hepatic Y 90 radio embolization (first treatment). Electronically Signed: By: Jerilynn Mages.  Shick M.D.  On: 08/11/2017 11:11   Ir Angiogram Selective Each Additional Vessel  Addendum Date: 08/23/2017   ADDENDUM REPORT: 08/23/2017 10:42 ADDENDUM: To the PROCEDURE: Ultrasound micropuncture access performed of the patent right common femoral artery. Ultrasound images obtained for documentation of femoral artery patency. Electronically Signed   By: Jerilynn Mages.  Shick M.D.   On: 08/23/2017 10:42   Result Date: 08/23/2017 INDICATION: Metastatic neuroendocrine tumor to the liver EXAM: ULTRASOUND GUIDANCE FOR VASCULAR ACCESS SELECTIVE CATHETERIZATION AND ANGIOGRAMS OF THE CELIAC AND LEFT HEPATIC ARTERY LEFT HEPATIC ARTERY Y 90 RADIO EMBOLIZATION MEDICATIONS: 3.375 G ZOSYN. The antibiotic was administered within 1 hour of the procedure ANESTHESIA/SEDATION: Moderate (conscious) sedation was employed during this procedure. A total of Versed 2.0 mg and Fentanyl 100 mcg was administered intravenously. Moderate Sedation Time: 45 minutes. The patient's level of consciousness and vital signs were monitored continuously by radiology nursing throughout the procedure under my direct supervision. CONTRAST:  50 CC ISOVUE FLUOROSCOPY TIME:  Fluoroscopy Time: 6 minutes 30 seconds (199 mGy). COMPLICATIONS: None  immediate. PROCEDURE: Informed consent was obtained from the patient following explanation of the procedure, risks, benefits and alternatives. The patient understands, agrees and consents for the procedure. All questions were addressed. A time out was performed prior to the initiation of the procedure. Maximal barrier sterile technique utilized including caps, mask, sterile gowns, sterile gloves, large sterile drape, hand hygiene, and Betadine prep. under sterile conditions and local anesthesia, ultrasound micropuncture access performed the right common femoral artery. images obtained for documentation. five french sheath inserted over a bentson guidewire. chung 2.5 catheter advanced over guidewire and utilized to select the celiac origin. celiac angiogram performed. celiac: celiac is widely patent including its branches. the splenic, left gastric, hepatic vasculature all patent. there is previous coil embolization of the gastroduodenal artery. There is a very small right gastric artery noted off of the proper hepatic artery. Left and right hepatic vasculature patent. Tumor neovascularity and blushing noted in the left hepatic lobe to the dominant lesion. Renegade high flow microcatheter over a double angled Glidewire was advanced into the left hepatic artery. Selective left hepatic angiogram performed. Left hepatic: Left hepatic artery is patent. Tumor vascularity and staining noted to the dominant lesion in the left hepatic lobe lateral segment correlating with the MRI scan. This is the dominant supply to the left lesion. Y 90 radio embolization: The entire dose of Y90 was successfully delivered into left hepatic artery under intermittent fluoroscopy. Stasis was not obtained. Of note, there is tumor staining during the radio embolization. Access removed. Hemostasis obtained with the ExoSeal device. No immediate complication. Patient tolerated the procedure well. IMPRESSION: Successful left hepatic Y 90 radio  embolization (first treatment). Electronically Signed: By: Jerilynn Mages.  Shick M.D. On: 08/11/2017 11:11   Nm Special Med Rad Physics Cons  Result Date: 08/11/2017 CLINICAL DATA:  Unresectable neuroendocrine tumor metastasis to the liver. First treatment to left lobe. EXAM: NUCLEAR MEDICINE SPECIAL MED RAD PHYSICS CONS; NUCLEAR MEDICINE RADIO PHARM THERAPY INTRA ARTERIAL; NUCLEAR MEDICINE TREATMENT PROCEDURE; NUCLEAR MEDICINE LIVER SCAN TECHNIQUE: In conjunction with the interventional radiologist a Y- Microsphere dose was calculated utilizing body surface area formulation. Calculated dose equal 15.4 mCi. Pre therapy MAA liver SPECT scan and CTA were evaluated. Utilizing a microcatheter system, the left hepatic artery was selected and Y-90 microspheres were delivered in fractionated aliquots. Radiopharmaceutical was delivered by the interventional radiologist and nuclear radiologist. The patient tolerated procedure well. No adverse effects were noted. Bremsstrahlung planar and SPECT imaging of the abdomen following  intrahepatic arterial delivery of Y-90 microsphere was performed. RADIOPHARMACEUTICALS:  15.3  MCI Y-90 MICROSPHERES COMPARISON:  MR from 06/27/2017. FINDINGS: Y - 90 microspheres therapy as above. First therapy to the left hepatic lobe. Bremsstrahlung planar and SPECT imaging of the abdomen following intrahepatic arterial delivery of Y-31microsphere demonstrates radioactivity localized to the left hepatic lobe. No evidence of extrahepatic activity. IMPRESSION: Successful Y - 90 microsphere delivery for treatment of unresectable liver metastasis. First therapy to the left lobe. Bremssstrahlung scan demonstrates activity localized to left hepatic lobe with no extrahepatic activity identified. Electronically Signed   By: Kerby Moors M.D.   On: 08/11/2017 14:21   Nm Special Treatment Procedure  Result Date: 08/11/2017 CLINICAL DATA:  Unresectable neuroendocrine tumor metastasis to the liver. First treatment  to left lobe. EXAM: NUCLEAR MEDICINE SPECIAL MED RAD PHYSICS CONS; NUCLEAR MEDICINE RADIO PHARM THERAPY INTRA ARTERIAL; NUCLEAR MEDICINE TREATMENT PROCEDURE; NUCLEAR MEDICINE LIVER SCAN TECHNIQUE: In conjunction with the interventional radiologist a Y- Microsphere dose was calculated utilizing body surface area formulation. Calculated dose equal 15.4 mCi. Pre therapy MAA liver SPECT scan and CTA were evaluated. Utilizing a microcatheter system, the left hepatic artery was selected and Y-90 microspheres were delivered in fractionated aliquots. Radiopharmaceutical was delivered by the interventional radiologist and nuclear radiologist. The patient tolerated procedure well. No adverse effects were noted. Bremsstrahlung planar and SPECT imaging of the abdomen following intrahepatic arterial delivery of Y-90 microsphere was performed. RADIOPHARMACEUTICALS:  15.3  MCI Y-90 MICROSPHERES COMPARISON:  MR from 06/27/2017. FINDINGS: Y - 90 microspheres therapy as above. First therapy to the left hepatic lobe. Bremsstrahlung planar and SPECT imaging of the abdomen following intrahepatic arterial delivery of Y-28microsphere demonstrates radioactivity localized to the left hepatic lobe. No evidence of extrahepatic activity. IMPRESSION: Successful Y - 90 microsphere delivery for treatment of unresectable liver metastasis. First therapy to the left lobe. Bremssstrahlung scan demonstrates activity localized to left hepatic lobe with no extrahepatic activity identified. Electronically Signed   By: Kerby Moors M.D.   On: 08/11/2017 14:21   Ir US Guide Vasc Access Right  Addendum Date: 08/23/2017   ADDENDUM REPORT: 08/23/2017 10:42 ADDENDUM: To the PROCEDURE: Ultrasound micropuncture access performed of the patent right common femoral artery. Ultrasound images obtained for documentation of femoral artery patency. Electronically Signed   By: Jerilynn Mages.  Shick M.D.   On: 08/23/2017 10:42   Result Date: 08/23/2017 INDICATION: Metastatic  neuroendocrine tumor to the liver EXAM: ULTRASOUND GUIDANCE FOR VASCULAR ACCESS SELECTIVE CATHETERIZATION AND ANGIOGRAMS OF THE CELIAC AND LEFT HEPATIC ARTERY LEFT HEPATIC ARTERY Y 90 RADIO EMBOLIZATION MEDICATIONS: 3.375 G ZOSYN. The antibiotic was administered within 1 hour of the procedure ANESTHESIA/SEDATION: Moderate (conscious) sedation was employed during this procedure. A total of Versed 2.0 mg and Fentanyl 100 mcg was administered intravenously. Moderate Sedation Time: 45 minutes. The patient's level of consciousness and vital signs were monitored continuously by radiology nursing throughout the procedure under my direct supervision. CONTRAST:  50 CC ISOVUE FLUOROSCOPY TIME:  Fluoroscopy Time: 6 minutes 30 seconds (199 mGy). COMPLICATIONS: None immediate. PROCEDURE: Informed consent was obtained from the patient following explanation of the procedure, risks, benefits and alternatives. The patient understands, agrees and consents for the procedure. All questions were addressed. A time out was performed prior to the initiation of the procedure. Maximal barrier sterile technique utilized including caps, mask, sterile gowns, sterile gloves, large sterile drape, hand hygiene, and Betadine prep. under sterile conditions and local anesthesia, ultrasound micropuncture access performed the right common femoral artery. images obtained  for documentation. five french sheath inserted over a bentson guidewire. chung 2.5 catheter advanced over guidewire and utilized to select the celiac origin. celiac angiogram performed. celiac: celiac is widely patent including its branches. the splenic, left gastric, hepatic vasculature all patent. there is previous coil embolization of the gastroduodenal artery. There is a very small right gastric artery noted off of the proper hepatic artery. Left and right hepatic vasculature patent. Tumor neovascularity and blushing noted in the left hepatic lobe to the dominant lesion. Renegade  high flow microcatheter over a double angled Glidewire was advanced into the left hepatic artery. Selective left hepatic angiogram performed. Left hepatic: Left hepatic artery is patent. Tumor vascularity and staining noted to the dominant lesion in the left hepatic lobe lateral segment correlating with the MRI scan. This is the dominant supply to the left lesion. Y 90 radio embolization: The entire dose of Y90 was successfully delivered into left hepatic artery under intermittent fluoroscopy. Stasis was not obtained. Of note, there is tumor staining during the radio embolization. Access removed. Hemostasis obtained with the ExoSeal device. No immediate complication. Patient tolerated the procedure well. IMPRESSION: Successful left hepatic Y 90 radio embolization (first treatment). Electronically Signed: By: Jerilynn Mages.  Shick M.D. On: 08/11/2017 11:11   Ir Embo Tumor Organ Ischemia Infarct Inc Guide Roadmapping  Addendum Date: 08/23/2017   ADDENDUM REPORT: 08/23/2017 10:42 ADDENDUM: To the PROCEDURE: Ultrasound micropuncture access performed of the patent right common femoral artery. Ultrasound images obtained for documentation of femoral artery patency. Electronically Signed   By: Jerilynn Mages.  Shick M.D.   On: 08/23/2017 10:42   Result Date: 08/23/2017 INDICATION: Metastatic neuroendocrine tumor to the liver EXAM: ULTRASOUND GUIDANCE FOR VASCULAR ACCESS SELECTIVE CATHETERIZATION AND ANGIOGRAMS OF THE CELIAC AND LEFT HEPATIC ARTERY LEFT HEPATIC ARTERY Y 90 RADIO EMBOLIZATION MEDICATIONS: 3.375 G ZOSYN. The antibiotic was administered within 1 hour of the procedure ANESTHESIA/SEDATION: Moderate (conscious) sedation was employed during this procedure. A total of Versed 2.0 mg and Fentanyl 100 mcg was administered intravenously. Moderate Sedation Time: 45 minutes. The patient's level of consciousness and vital signs were monitored continuously by radiology nursing throughout the procedure under my direct supervision. CONTRAST:   50 CC ISOVUE FLUOROSCOPY TIME:  Fluoroscopy Time: 6 minutes 30 seconds (199 mGy). COMPLICATIONS: None immediate. PROCEDURE: Informed consent was obtained from the patient following explanation of the procedure, risks, benefits and alternatives. The patient understands, agrees and consents for the procedure. All questions were addressed. A time out was performed prior to the initiation of the procedure. Maximal barrier sterile technique utilized including caps, mask, sterile gowns, sterile gloves, large sterile drape, hand hygiene, and Betadine prep. under sterile conditions and local anesthesia, ultrasound micropuncture access performed the right common femoral artery. images obtained for documentation. five french sheath inserted over a bentson guidewire. chung 2.5 catheter advanced over guidewire and utilized to select the celiac origin. celiac angiogram performed. celiac: celiac is widely patent including its branches. the splenic, left gastric, hepatic vasculature all patent. there is previous coil embolization of the gastroduodenal artery. There is a very small right gastric artery noted off of the proper hepatic artery. Left and right hepatic vasculature patent. Tumor neovascularity and blushing noted in the left hepatic lobe to the dominant lesion. Renegade high flow microcatheter over a double angled Glidewire was advanced into the left hepatic artery. Selective left hepatic angiogram performed. Left hepatic: Left hepatic artery is patent. Tumor vascularity and staining noted to the dominant lesion in the left hepatic lobe lateral segment  correlating with the MRI scan. This is the dominant supply to the left lesion. Y 90 radio embolization: The entire dose of Y90 was successfully delivered into left hepatic artery under intermittent fluoroscopy. Stasis was not obtained. Of note, there is tumor staining during the radio embolization. Access removed. Hemostasis obtained with the ExoSeal device. No immediate  complication. Patient tolerated the procedure well. IMPRESSION: Successful left hepatic Y 90 radio embolization (first treatment). Electronically Signed: By: Jerilynn Mages.  Shick M.D. On: 08/11/2017 11:11   Nm Radio Pharm Therapy Intraarterial  Result Date: 08/11/2017 CLINICAL DATA:  Unresectable neuroendocrine tumor metastasis to the liver. First treatment to left lobe. EXAM: NUCLEAR MEDICINE SPECIAL MED RAD PHYSICS CONS; NUCLEAR MEDICINE RADIO PHARM THERAPY INTRA ARTERIAL; NUCLEAR MEDICINE TREATMENT PROCEDURE; NUCLEAR MEDICINE LIVER SCAN TECHNIQUE: In conjunction with the interventional radiologist a Y- Microsphere dose was calculated utilizing body surface area formulation. Calculated dose equal 15.4 mCi. Pre therapy MAA liver SPECT scan and CTA were evaluated. Utilizing a microcatheter system, the left hepatic artery was selected and Y-90 microspheres were delivered in fractionated aliquots. Radiopharmaceutical was delivered by the interventional radiologist and nuclear radiologist. The patient tolerated procedure well. No adverse effects were noted. Bremsstrahlung planar and SPECT imaging of the abdomen following intrahepatic arterial delivery of Y-90 microsphere was performed. RADIOPHARMACEUTICALS:  15.3  MCI Y-90 MICROSPHERES COMPARISON:  MR from 06/27/2017. FINDINGS: Y - 90 microspheres therapy as above. First therapy to the left hepatic lobe. Bremsstrahlung planar and SPECT imaging of the abdomen following intrahepatic arterial delivery of Y-33microsphere demonstrates radioactivity localized to the left hepatic lobe. No evidence of extrahepatic activity. IMPRESSION: Successful Y - 90 microsphere delivery for treatment of unresectable liver metastasis. First therapy to the left lobe. Bremssstrahlung scan demonstrates activity localized to left hepatic lobe with no extrahepatic activity identified. Electronically Signed   By: Kerby Moors M.D.   On: 08/11/2017 14:21    ASSESSMENT: Stage IVA neuroendocrine tumor  of the lung metastatic to the liver.  PLAN:    1. 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 Patient underwent left upper lobe lobectomy in [place] on June 04, 2013. Final pathology results revealed a well differentiated neuroendocrine tumor with positive bronchial margins. Patient did not receive adjuvant XRT or chemotherapy at that time.  MRI results from June 27, 2017 reviewed independently with progression of patient's known disease in his liver.  His chromogranin A levels are also trending up slightly.  Patient had his most recent Y-90 injection on August 11, 2017.  He has follow-up with interventional radiology on Sep 07, 2017.  Will defer additional imaging and timing of patient's next MRI to IR.  Proceed with an 120 mg subcutaneous Lanreotide every 4 weeks.  Return to clinic in 4 weeks for Lanreotide and further evaluation.   2.  Diarrhea: Patient does not complain of this today.  Approximately 30 minutes was spent in discussion of which greater than 50% was consultation.  Patient expressed understanding and was in agreement with this plan. He also understands that He can call clinic at any time with any questions, concerns, or complaints.   Cancer Staging Neuroendocrine carcinoma of lung [facility]) Staging form: Lung, AJCC 8th Edition - Clinical stage from 12/28/2016: Stage IVA (cT2a, cN0, cM1b) - Signed by Lloyd Huger, MD on 12/28/2016   Lloyd Huger, MD   08/31/2017 9:35 AM

## 2017-08-29 ENCOUNTER — Inpatient Hospital Stay: Payer: Medicare Other

## 2017-08-29 ENCOUNTER — Inpatient Hospital Stay: Payer: Medicare Other | Attending: Oncology

## 2017-08-29 ENCOUNTER — Inpatient Hospital Stay (HOSPITAL_BASED_OUTPATIENT_CLINIC_OR_DEPARTMENT_OTHER): Payer: Medicare Other | Admitting: Oncology

## 2017-08-29 VITALS — BP 156/85 | HR 55 | Temp 97.8°F | Resp 20 | Wt 158.5 lb

## 2017-08-29 DIAGNOSIS — I4891 Unspecified atrial fibrillation: Secondary | ICD-10-CM | POA: Insufficient documentation

## 2017-08-29 DIAGNOSIS — N400 Enlarged prostate without lower urinary tract symptoms: Secondary | ICD-10-CM | POA: Insufficient documentation

## 2017-08-29 DIAGNOSIS — Z79899 Other long term (current) drug therapy: Secondary | ICD-10-CM | POA: Diagnosis not present

## 2017-08-29 DIAGNOSIS — C7B02 Secondary carcinoid tumors of liver: Secondary | ICD-10-CM

## 2017-08-29 DIAGNOSIS — C7B8 Other secondary neuroendocrine tumors: Secondary | ICD-10-CM

## 2017-08-29 DIAGNOSIS — I10 Essential (primary) hypertension: Secondary | ICD-10-CM | POA: Insufficient documentation

## 2017-08-29 DIAGNOSIS — Z87891 Personal history of nicotine dependence: Secondary | ICD-10-CM | POA: Diagnosis not present

## 2017-08-29 DIAGNOSIS — Z902 Acquired absence of lung [part of]: Secondary | ICD-10-CM | POA: Diagnosis not present

## 2017-08-29 DIAGNOSIS — I4892 Unspecified atrial flutter: Secondary | ICD-10-CM | POA: Insufficient documentation

## 2017-08-29 DIAGNOSIS — C7A8 Other malignant neuroendocrine tumors: Secondary | ICD-10-CM

## 2017-08-29 DIAGNOSIS — M8580 Other specified disorders of bone density and structure, unspecified site: Secondary | ICD-10-CM | POA: Insufficient documentation

## 2017-08-29 DIAGNOSIS — C7800 Secondary malignant neoplasm of unspecified lung: Secondary | ICD-10-CM | POA: Diagnosis not present

## 2017-08-29 DIAGNOSIS — E785 Hyperlipidemia, unspecified: Secondary | ICD-10-CM | POA: Insufficient documentation

## 2017-08-29 LAB — CBC WITH DIFFERENTIAL/PLATELET
BASOS ABS: 0 10*3/uL (ref 0–0.1)
Basophils Relative: 0 %
EOS PCT: 2 %
Eosinophils Absolute: 0.2 10*3/uL (ref 0–0.7)
HEMATOCRIT: 40.6 % (ref 40.0–52.0)
Hemoglobin: 14 g/dL (ref 13.0–18.0)
LYMPHS ABS: 0.6 10*3/uL — AB (ref 1.0–3.6)
LYMPHS PCT: 6 %
MCH: 31.1 pg (ref 26.0–34.0)
MCHC: 34.4 g/dL (ref 32.0–36.0)
MCV: 90.4 fL (ref 80.0–100.0)
MONO ABS: 1.4 10*3/uL — AB (ref 0.2–1.0)
MONOS PCT: 13 %
NEUTROS ABS: 8.1 10*3/uL — AB (ref 1.4–6.5)
Neutrophils Relative %: 79 %
PLATELETS: 190 10*3/uL (ref 150–440)
RBC: 4.49 MIL/uL (ref 4.40–5.90)
RDW: 13.6 % (ref 11.5–14.5)
WBC: 10.3 10*3/uL (ref 3.8–10.6)

## 2017-08-29 LAB — COMPREHENSIVE METABOLIC PANEL
ALT: 14 U/L — ABNORMAL LOW (ref 17–63)
AST: 18 U/L (ref 15–41)
Albumin: 3.8 g/dL (ref 3.5–5.0)
Alkaline Phosphatase: 110 U/L (ref 38–126)
Anion gap: 9 (ref 5–15)
BILIRUBIN TOTAL: 0.5 mg/dL (ref 0.3–1.2)
BUN: 17 mg/dL (ref 6–20)
CO2: 26 mmol/L (ref 22–32)
Calcium: 9.4 mg/dL (ref 8.9–10.3)
Chloride: 104 mmol/L (ref 101–111)
Creatinine, Ser: 1.05 mg/dL (ref 0.61–1.24)
GFR calc Af Amer: >60 mL/min (ref >60)
GFR calc non Af Amer: >60 mL/min (ref >60)
Glucose, Bld: 130 mg/dL — ABNORMAL HIGH (ref 65–99)
POTASSIUM: 4.7 mmol/L (ref 3.5–5.1)
SODIUM: 139 mmol/L (ref 135–145)
TOTAL PROTEIN: 6.8 g/dL (ref 6.5–8.1)

## 2017-08-29 MED ORDER — LANREOTIDE ACETATE 120 MG/0.5ML ~~LOC~~ SOLN
120.0000 mg | Freq: Once | SUBCUTANEOUS | Status: AC
  Administered 2017-08-29: 120 mg via SUBCUTANEOUS
  Filled 2017-08-29: qty 120

## 2017-08-29 NOTE — Progress Notes (Signed)
Pt in for follow up reports night sweats have returned.  Saturating clothing and bed linens.  Otherwise, has been feeling well.

## 2017-08-31 LAB — CHROMOGRANIN A: CHROMOGRANIN A: 7 nmol/L — AB (ref 0–5)

## 2017-09-07 ENCOUNTER — Ambulatory Visit
Admission: RE | Admit: 2017-09-07 | Discharge: 2017-09-07 | Disposition: A | Discharge status: Home / Self Care | Payer: Medicare Other | Source: Ambulatory Visit | Attending: Interventional Radiology | Admitting: Interventional Radiology

## 2017-09-07 ENCOUNTER — Encounter: Payer: Self-pay | Admitting: Radiology

## 2017-09-07 DIAGNOSIS — C7B02 Secondary carcinoid tumors of liver: Secondary | ICD-10-CM | POA: Diagnosis not present

## 2017-09-07 DIAGNOSIS — C7A8 Other malignant neuroendocrine tumors: Secondary | ICD-10-CM

## 2017-09-07 HISTORY — PX: IR RADIOLOGIST EVAL & MGMT: IMG5224

## 2017-09-07 NOTE — Progress Notes (Signed)
Patient ID: Jason Malone, male   DOB: 1940/11/05, 77 y.o.   MRN: [id number]       Chief Complaint:  Metastatic neuroendocrine tumor to the liver, enlarging left hepatic metastasis  Referring Physician(s): [place]  History of Present Illness: Jason Malone is a 77 y.o. male with stage IV metastatic well-differentiated neuroendocrine tumor to the liver from a lung primary.  Surveillance imaging confirmed interval enlargement of the dominant left hepatic lesion.  He remains asymptomatic with a great functional status.  No physical limitations.  He is now 1 month status post left hepatic Y 90 radioembolization performed at [facility].  Following treatment he had a slow recovery over approximately 7 days with mild nausea and vomiting.  Mild associated myalgias and abdominal discomfort.  This has resolved.  Energy level is better.  No recent fevers.  He is back to walking daily.  No current chest pain or shortness of breath.  No signs of flushing syndrome or diarrhea.  Overall he feels he has recovered.  Past Medical History:  Diagnosis Date  . Allergic rhinitis   . Benign prostatic hypertrophy with lower urinary tract symptoms (LUTS)   . Chest pain    a. 05/2013 MV: EF 70%, no ischemia.  . Diverticulitis    DIVERTICULOSIS  . Enlarged prostate   . Essential hypertension    CONTROLLED ON MEDS  . Full dentures    upper and lower  . H/O hypokalemia   . Hx of atrial fibrillation without current medication    CARDIOLOGIST-DR [facility]  . 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 Hyperlipemia   . Hypomagnesemia   . IFG (impaired fasting glucose)   . Liver cancer ([place])   . Liver cancer ([place]) 12/08/2016  . Liver mass, left lobe 08/28/2016   Probably hemangioma; will get MRI of liver, refer to GI  . Lung cancer ([place])    a. carcinoid, left lung, Stage 1b (T2a, N0, cM0);  b. 05/2013 s/p VATS & LULobectomy.  . Monocytosis   . Osteopenia   . Paroxysmal atrial  flutter (H[facility])    a. 03/2013->no recurrence;  b. CHA2DS2VASc = 2-->not currently on anticoagulation;  c. 05/2012 Echo: EF 55-60%, normal RV.  [name] Wears glasses     Past Surgical History:  Procedure Laterality Date  . BRONCHOSCOPY     04/06/2013  . CARDIOVASCULAR STRESS TEST  05/2013   a. No evidence of ischemia or infarct, EF 70%, no WMAs  . CATARACT EXTRACTION W/PHACO Left 07/14/2015   Procedure: CATARACT EXTRACTION PHACO AND INTRAOCULAR LENS PLACEMENT (IOC);  Surgeon: [name], MD;  Location: [place];  Service: Ophthalmology;  Laterality: Left;  . CATARACT EXTRACTION W/PHACO Right 10/01/2015   Procedure: CATARACT EXTRACTION PHACO AND INTRAOCULAR LENS PLACEMENT (IOC);  Surgeon: [name], MD;  Location: [place];  Service: Ophthalmology;  Laterality: Right;  . COLONOSCOPY W/ POLYPECTOMY     bleed after-had to go to surgery to stop bleeding via colonoscopy  . COLONOSCOPY WITH PROPOFOL N/A 11/19/2015   Procedure: COLONOSCOPY WITH PROPOFOL;  Surgeon: [name], MD;  [facility]: [facility] ENDOSCOPY;  Service: Endoscopy;  Laterality: N/A;  . DUPUYTREN CONTRACTURE RELEASE  12/15/2011   Procedure: DUPUYTREN CONTRACTURE RELEASE;  Surgeon: [name], MD;  Location: [place];  Service: Orthopedics;  Laterality: Left;  Fasciotomy left ring finger dupuytrens  . IR ANGIOGRAM SELECTIVE EACH ADDITIONAL VESSEL  07/26/2017  . IR ANGIOGRAM SELECTIVE EACH ADDITIONAL  VESSEL  07/26/2017  . IR ANGIOGRAM SELECTIVE EACH ADDITIONAL VESSEL  07/26/2017  . IR ANGIOGRAM SELECTIVE EACH ADDITIONAL VESSEL  07/26/2017  . IR ANGIOGRAM SELECTIVE EACH ADDITIONAL VESSEL  07/26/2017  . IR ANGIOGRAM SELECTIVE EACH ADDITIONAL VESSEL  08/11/2017  . IR ANGIOGRAM VISCERAL SELECTIVE  07/26/2017  . IR ANGIOGRAM VISCERAL SELECTIVE  08/11/2017  . IR EMBO ARTERIAL NOT HEMORR HEMANG INC GUIDE ROADMAPPING  07/26/2017  . IR EMBO TUMOR ORGAN ISCHEMIA INFARCT INC GUIDE ROADMAPPING  08/11/2017  .  IR RADIOLOGIST EVAL & MGMT  07/07/2017  . IR RADIOLOGIST EVAL & MGMT  09/07/2017  . IR US GUIDE VASC ACCESS RIGHT  07/26/2017  . IR US GUIDE VASC ACCESS RIGHT  08/11/2017  . LUNG LOBECTOMY  06/10/13   upper left lung  . MULTIPLE TOOTH EXTRACTIONS    . REMOVAL RETAINED LENS Right 11/17/2015   Procedure: REMOVAL RETAINED LENS FROAGMENTS RIGHT EYE;  Surgeon: [name], MD;  Location: [place];  Service: Ophthalmology;  Laterality: Right;  . TONSILLECTOMY    . UPPER GASTROINTESTINAL ENDOSCOPY  11-03-15   Dr [name]  . VASECTOMY    . VIDEO ASSISTED THORACOSCOPY (VATS)/WEDGE RESECTION Left 06/04/2013   Procedure: VIDEO ASSISTED THORACOSCOPY (VATS)/WEDGE RESECTION;  Surgeon: [name], MD;  Location: [place];  Service: Thoracic;  Laterality: Left;  [name] VIDEO BRONCHOSCOPY N/A 06/04/2013   Procedure: VIDEO BRONCHOSCOPY;  Surgeon: [name], MD;  Location: [facility];  Service: Thoracic;  Laterality: N/A;    Allergies: Latex and Tape  Medications: Prior to Admission medications   Medication Sig Start Date End Date Taking? Authorizing Provider  amLODipine (NORVASC) 2.5 MG tablet TAKE ONE TABLET BY MOUTH ONCE DAILY 04/11/17  Yes Gollan, Kathlene November, MD  atorvastatin (LIPITOR) 40 MG tablet TAKE 1 TABLET BY MOUTH AT BEDTIME FOR CHOLESTEROL 08/16/17  Yes Lada, Satira Anis, MD  b complex vitamins capsule Take 1 capsule by mouth daily. AM   Yes [provider]  Calcium Carbonate-Vitamin D3 (CALCIUM 600/VITAMIN D) 600-400 MG-UNIT TABS Take by mouth daily.   Yes [provider]  carvedilol (COREG) 3.125 MG tablet TAKE ONE TABLET BY MOUTH ONCE DAILY IN AM 02/28/17  Yes Gollan, Kathlene November, MD  Cinnamon 500 MG capsule Take 500 mg by mouth 2 (two) times daily. AM AND BEDTIME   Yes [provider]  finasteride (PROSCAR) 5 MG tablet TAKE ONE TABLET BY MOUTH ONCE DAILY 10/05/16  Yes Lada, Satira Anis, MD  loratadine (CLARITIN) 10 MG tablet Take 10 mg by mouth daily. AM   Yes  [provider]  losartan (COZAAR) 100 MG tablet TAKE ONE TABLET BY MOUTH ONCE DAILY 11/30/16  Yes Gollan, Kathlene November, MD  Multiple Vitamins-Minerals (MULTIVITAMIN WITH MINERALS) tablet Take 1 tablet by mouth daily. AM   Yes [provider]  pantoprazole (PROTONIX) 40 MG tablet  08/18/17  Yes [provider]  polycarbophil (FIBERCON) 625 MG tablet Take 625 mg by mouth daily. AM   Yes [provider]  polyethylene glycol (MIRALAX / GLYCOLAX) packet Take 17 g by mouth daily.   Yes [provider]  tamsulosin (FLOMAX) 0.4 MG CAPS capsule TAKE ONE CAPSULE BY MOUTH ONCE DAILY 04/26/17  Yes Lada, Satira Anis, MD  vitamin C (ASCORBIC ACID) 500 MG tablet Take 500 mg by mouth daily. AM   Yes [provider]     Family History  Problem Relation Age of Onset  . Stroke Mother   . Alzheimer's disease Mother   .  Hypertension Sister   . Hyperlipidemia Sister   . Hypertension Brother   . Hyperlipidemia Brother   . Diabetes Brother   . Cancer Sister        lung and colon    Social History   Socioeconomic History  . Marital status: Married    Spouse name: Not on file  . Number of children: Not on file  . Years of education: Not on file  . Highest education level: Not on file  Occupational History  . Not on file  Social Needs  . Financial resource strain: Not on file  . Food insecurity:    Worry: Not on file    Inability: Not on file  . Transportation needs:    Medical: Not on file    Non-medical: Not on file  Tobacco Use  . Smoking status: Former Smoker    Packs/day: 1.00    Years: 40.00    Pack years: 40.00    Types: Cigarettes    Last attempt to quit: 12/09/1998    Years since quitting: 18.7  . Smokeless tobacco: Never Used  Substance and Sexual Activity  . Alcohol use: No  . Drug use: No  . Sexual activity: Yes  Lifestyle  . Physical activity:    Days per week: Not on file    Minutes per session: Not on file  . Stress: Not on  file  Relationships  . Social connections:    Talks on phone: Not on file    Gets together: Not on file    Attends religious service: Not on file    Active member of club or organization: Not on file    Attends meetings of clubs or organizations: Not on file    Relationship status: Not on file  Other Topics Concern  . Not on file  Social History Narrative  . Not on file    ECOG Status: 1 - Symptomatic but completely ambulatory  Review of Systems: A 12 point ROS discussed and pertinent positives are indicated in the HPI above.  All other systems are negative.  Review of Systems  Vital Signs: BP (!) 141/78   Pulse (!) 56   Temp 98 F (36.7 C) (Oral)   Resp 14   Ht 5\' 4"  (1.626 m)   SpO2 97%   BMI 27.21 kg/m   Physical Exam  Constitutional: He appears well-developed and well-nourished. No distress.  Eyes: No scleral icterus.  Cardiovascular: Normal rate, regular rhythm and intact distal pulses.  Pulmonary/Chest: Effort normal and breath sounds normal.  Abdominal: Soft. Bowel sounds are normal. He exhibits no distension and no mass. There is no tenderness.  Skin: He is not diaphoretic.     Imaging: Nm Liver Img Spect  Result Date: 08/11/2017 CLINICAL DATA:  Unresectable neuroendocrine tumor metastasis to the liver. First treatment to left lobe. EXAM: NUCLEAR MEDICINE SPECIAL MED RAD PHYSICS CONS; NUCLEAR MEDICINE RADIO PHARM THERAPY INTRA ARTERIAL; NUCLEAR MEDICINE TREATMENT PROCEDURE; NUCLEAR MEDICINE LIVER SCAN TECHNIQUE: In conjunction with the interventional radiologist a Y- Microsphere dose was calculated utilizing body surface area formulation. Calculated dose equal 15.4 mCi. Pre therapy MAA liver SPECT scan and CTA were evaluated. Utilizing a microcatheter system, the left hepatic artery was selected and Y-90 microspheres were delivered in fractionated aliquots. Radiopharmaceutical was delivered by the interventional radiologist and nuclear radiologist. The patient  tolerated procedure well. No adverse effects were noted. Bremsstrahlung planar and SPECT imaging of the abdomen following intrahepatic arterial delivery of Y-90 microsphere was performed.  RADIOPHARMACEUTICALS:  15.3  MCI Y-90 MICROSPHERES COMPARISON:  MR from 06/27/2017. FINDINGS: Y - 90 microspheres therapy as above. First therapy to the left hepatic lobe. Bremsstrahlung planar and SPECT imaging of the abdomen following intrahepatic arterial delivery of Y-72microsphere demonstrates radioactivity localized to the left hepatic lobe. No evidence of extrahepatic activity. IMPRESSION: Successful Y - 90 microsphere delivery for treatment of unresectable liver metastasis. First therapy to the left lobe. Bremssstrahlung scan demonstrates activity localized to left hepatic lobe with no extrahepatic activity identified. Electronically Signed   By: Kerby Moors M.D.   On: 08/11/2017 14:21   Ir Angiogram Visceral Selective  Addendum Date: 08/23/2017   ADDENDUM REPORT: 08/23/2017 10:42 ADDENDUM: To the PROCEDURE: Ultrasound micropuncture access performed of the patent right common femoral artery. Ultrasound images obtained for documentation of femoral artery patency. Electronically Signed   By: Jerilynn Mages.  Khloie Hamada M.D.   On: 08/23/2017 10:42   Result Date: 08/23/2017 INDICATION: Metastatic neuroendocrine tumor to the liver EXAM: ULTRASOUND GUIDANCE FOR VASCULAR ACCESS SELECTIVE CATHETERIZATION AND ANGIOGRAMS OF THE CELIAC AND LEFT HEPATIC ARTERY LEFT HEPATIC ARTERY Y 90 RADIO EMBOLIZATION MEDICATIONS: 3.375 G ZOSYN. The antibiotic was administered within 1 hour of the procedure ANESTHESIA/SEDATION: Moderate (conscious) sedation was employed during this procedure. A total of Versed 2.0 mg and Fentanyl 100 mcg was administered intravenously. Moderate Sedation Time: 45 minutes. The patient's level of consciousness and vital signs were monitored continuously by radiology nursing throughout the procedure under my direct supervision.  CONTRAST:  50 CC ISOVUE FLUOROSCOPY TIME:  Fluoroscopy Time: 6 minutes 30 seconds (199 mGy). COMPLICATIONS: None immediate. PROCEDURE: Informed consent was obtained from the patient following explanation of the procedure, risks, benefits and alternatives. The patient understands, agrees and consents for the procedure. All questions were addressed. A time out was performed prior to the initiation of the procedure. Maximal barrier sterile technique utilized including caps, mask, sterile gowns, sterile gloves, large sterile drape, hand hygiene, and Betadine prep. under sterile conditions and local anesthesia, ultrasound micropuncture access performed the right common femoral artery. images obtained for documentation. five french sheath inserted over a bentson guidewire. chung 2.5 catheter advanced over guidewire and utilized to select the celiac origin. celiac angiogram performed. celiac: celiac is widely patent including its branches. the splenic, left gastric, hepatic vasculature all patent. there is previous coil embolization of the gastroduodenal artery. There is a very small right gastric artery noted off of the proper hepatic artery. Left and right hepatic vasculature patent. Tumor neovascularity and blushing noted in the left hepatic lobe to the dominant lesion. Renegade high flow microcatheter over a double angled Glidewire was advanced into the left hepatic artery. Selective left hepatic angiogram performed. Left hepatic: Left hepatic artery is patent. Tumor vascularity and staining noted to the dominant lesion in the left hepatic lobe lateral segment correlating with the MRI scan. This is the dominant supply to the left lesion. Y 90 radio embolization: The entire dose of Y90 was successfully delivered into left hepatic artery under intermittent fluoroscopy. Stasis was not obtained. Of note, there is tumor staining during the radio embolization. Access removed. Hemostasis obtained with the ExoSeal device. No  immediate complication. Patient tolerated the procedure well. IMPRESSION: Successful left hepatic Y 90 radio embolization (first treatment). Electronically Signed: By: Jerilynn Mages.  Verlena Marlette M.D. On: 08/11/2017 11:11   Ir Angiogram Selective Each Additional Vessel  Addendum Date: 08/23/2017   ADDENDUM REPORT: 08/23/2017 10:42 ADDENDUM: To the PROCEDURE: Ultrasound micropuncture access performed of the patent right common femoral artery. Ultrasound  images obtained for documentation of femoral artery patency. Electronically Signed   By: Jerilynn Mages.  Shelie Lansing M.D.   On: 08/23/2017 10:42   Result Date: 08/23/2017 INDICATION: Metastatic neuroendocrine tumor to the liver EXAM: ULTRASOUND GUIDANCE FOR VASCULAR ACCESS SELECTIVE CATHETERIZATION AND ANGIOGRAMS OF THE CELIAC AND LEFT HEPATIC ARTERY LEFT HEPATIC ARTERY Y 90 RADIO EMBOLIZATION MEDICATIONS: 3.375 G ZOSYN. The antibiotic was administered within 1 hour of the procedure ANESTHESIA/SEDATION: Moderate (conscious) sedation was employed during this procedure. A total of Versed 2.0 mg and Fentanyl 100 mcg was administered intravenously. Moderate Sedation Time: 45 minutes. The patient's level of consciousness and vital signs were monitored continuously by radiology nursing throughout the procedure under my direct supervision. CONTRAST:  50 CC ISOVUE FLUOROSCOPY TIME:  Fluoroscopy Time: 6 minutes 30 seconds (199 mGy). COMPLICATIONS: None immediate. PROCEDURE: Informed consent was obtained from the patient following explanation of the procedure, risks, benefits and alternatives. The patient understands, agrees and consents for the procedure. All questions were addressed. A time out was performed prior to the initiation of the procedure. Maximal barrier sterile technique utilized including caps, mask, sterile gowns, sterile gloves, large sterile drape, hand hygiene, and Betadine prep. under sterile conditions and local anesthesia, ultrasound micropuncture access performed the right common  femoral artery. images obtained for documentation. five french sheath inserted over a bentson guidewire. chung 2.5 catheter advanced over guidewire and utilized to select the celiac origin. celiac angiogram performed. celiac: celiac is widely patent including its branches. the splenic, left gastric, hepatic vasculature all patent. there is previous coil embolization of the gastroduodenal artery. There is a very small right gastric artery noted off of the proper hepatic artery. Left and right hepatic vasculature patent. Tumor neovascularity and blushing noted in the left hepatic lobe to the dominant lesion. Renegade high flow microcatheter over a double angled Glidewire was advanced into the left hepatic artery. Selective left hepatic angiogram performed. Left hepatic: Left hepatic artery is patent. Tumor vascularity and staining noted to the dominant lesion in the left hepatic lobe lateral segment correlating with the MRI scan. This is the dominant supply to the left lesion. Y 90 radio embolization: The entire dose of Y90 was successfully delivered into left hepatic artery under intermittent fluoroscopy. Stasis was not obtained. Of note, there is tumor staining during the radio embolization. Access removed. Hemostasis obtained with the ExoSeal device. No immediate complication. Patient tolerated the procedure well. IMPRESSION: Successful left hepatic Y 90 radio embolization (first treatment). Electronically Signed: By: Jerilynn Mages.  Vikram Tillett M.D. On: 08/11/2017 11:11   Nm Special Med Rad Physics Cons  Result Date: 08/11/2017 CLINICAL DATA:  Unresectable neuroendocrine tumor metastasis to the liver. First treatment to left lobe. EXAM: NUCLEAR MEDICINE SPECIAL MED RAD PHYSICS CONS; NUCLEAR MEDICINE RADIO PHARM THERAPY INTRA ARTERIAL; NUCLEAR MEDICINE TREATMENT PROCEDURE; NUCLEAR MEDICINE LIVER SCAN TECHNIQUE: In conjunction with the interventional radiologist a Y- Microsphere dose was calculated utilizing body surface area  formulation. Calculated dose equal 15.4 mCi. Pre therapy MAA liver SPECT scan and CTA were evaluated. Utilizing a microcatheter system, the left hepatic artery was selected and Y-90 microspheres were delivered in fractionated aliquots. Radiopharmaceutical was delivered by the interventional radiologist and nuclear radiologist. The patient tolerated procedure well. No adverse effects were noted. Bremsstrahlung planar and SPECT imaging of the abdomen following intrahepatic arterial delivery of Y-90 microsphere was performed. RADIOPHARMACEUTICALS:  15.3  MCI Y-90 MICROSPHERES COMPARISON:  MR from 06/27/2017. FINDINGS: Y - 90 microspheres therapy as above. First therapy to the left hepatic lobe. Bremsstrahlung planar  and SPECT imaging of the abdomen following intrahepatic arterial delivery of Y-25microsphere demonstrates radioactivity localized to the left hepatic lobe. No evidence of extrahepatic activity. IMPRESSION: Successful Y - 90 microsphere delivery for treatment of unresectable liver metastasis. First therapy to the left lobe. Bremssstrahlung scan demonstrates activity localized to left hepatic lobe with no extrahepatic activity identified. Electronically Signed   By: Kerby Moors M.D.   On: 08/11/2017 14:21   Nm Special Treatment Procedure  Result Date: 08/11/2017 CLINICAL DATA:  Unresectable neuroendocrine tumor metastasis to the liver. First treatment to left lobe. EXAM: NUCLEAR MEDICINE SPECIAL MED RAD PHYSICS CONS; NUCLEAR MEDICINE RADIO PHARM THERAPY INTRA ARTERIAL; NUCLEAR MEDICINE TREATMENT PROCEDURE; NUCLEAR MEDICINE LIVER SCAN TECHNIQUE: In conjunction with the interventional radiologist a Y- Microsphere dose was calculated utilizing body surface area formulation. Calculated dose equal 15.4 mCi. Pre therapy MAA liver SPECT scan and CTA were evaluated. Utilizing a microcatheter system, the left hepatic artery was selected and Y-90 microspheres were delivered in fractionated aliquots.  Radiopharmaceutical was delivered by the interventional radiologist and nuclear radiologist. The patient tolerated procedure well. No adverse effects were noted. Bremsstrahlung planar and SPECT imaging of the abdomen following intrahepatic arterial delivery of Y-90 microsphere was performed. RADIOPHARMACEUTICALS:  15.3  MCI Y-90 MICROSPHERES COMPARISON:  MR from 06/27/2017. FINDINGS: Y - 90 microspheres therapy as above. First therapy to the left hepatic lobe. Bremsstrahlung planar and SPECT imaging of the abdomen following intrahepatic arterial delivery of Y-7microsphere demonstrates radioactivity localized to the left hepatic lobe. No evidence of extrahepatic activity. IMPRESSION: Successful Y - 90 microsphere delivery for treatment of unresectable liver metastasis. First therapy to the left lobe. Bremssstrahlung scan demonstrates activity localized to left hepatic lobe with no extrahepatic activity identified. Electronically Signed   By: Kerby Moors M.D.   On: 08/11/2017 14:21   Ir US Guide Vasc Access Right  Addendum Date: 08/23/2017   ADDENDUM REPORT: 08/23/2017 10:42 ADDENDUM: To the PROCEDURE: Ultrasound micropuncture access performed of the patent right common femoral artery. Ultrasound images obtained for documentation of femoral artery patency. Electronically Signed   By: Jerilynn Mages.  Romell Cavanah M.D.   On: 08/23/2017 10:42   Result Date: 08/23/2017 INDICATION: Metastatic neuroendocrine tumor to the liver EXAM: ULTRASOUND GUIDANCE FOR VASCULAR ACCESS SELECTIVE CATHETERIZATION AND ANGIOGRAMS OF THE CELIAC AND LEFT HEPATIC ARTERY LEFT HEPATIC ARTERY Y 90 RADIO EMBOLIZATION MEDICATIONS: 3.375 G ZOSYN. The antibiotic was administered within 1 hour of the procedure ANESTHESIA/SEDATION: Moderate (conscious) sedation was employed during this procedure. A total of Versed 2.0 mg and Fentanyl 100 mcg was administered intravenously. Moderate Sedation Time: 45 minutes. The patient's level of consciousness and vital signs  were monitored continuously by radiology nursing throughout the procedure under my direct supervision. CONTRAST:  50 CC ISOVUE FLUOROSCOPY TIME:  Fluoroscopy Time: 6 minutes 30 seconds (199 mGy). COMPLICATIONS: None immediate. PROCEDURE: Informed consent was obtained from the patient following explanation of the procedure, risks, benefits and alternatives. The patient understands, agrees and consents for the procedure. All questions were addressed. A time out was performed prior to the initiation of the procedure. Maximal barrier sterile technique utilized including caps, mask, sterile gowns, sterile gloves, large sterile drape, hand hygiene, and Betadine prep. under sterile conditions and local anesthesia, ultrasound micropuncture access performed the right common femoral artery. images obtained for documentation. five french sheath inserted over a bentson guidewire. chung 2.5 catheter advanced over guidewire and utilized to select the celiac origin. celiac angiogram performed. celiac: celiac is widely patent including its branches. the splenic, left  gastric, hepatic vasculature all patent. there is previous coil embolization of the gastroduodenal artery. There is a very small right gastric artery noted off of the proper hepatic artery. Left and right hepatic vasculature patent. Tumor neovascularity and blushing noted in the left hepatic lobe to the dominant lesion. Renegade high flow microcatheter over a double angled Glidewire was advanced into the left hepatic artery. Selective left hepatic angiogram performed. Left hepatic: Left hepatic artery is patent. Tumor vascularity and staining noted to the dominant lesion in the left hepatic lobe lateral segment correlating with the MRI scan. This is the dominant supply to the left lesion. Y 90 radio embolization: The entire dose of Y90 was successfully delivered into left hepatic artery under intermittent fluoroscopy. Stasis was not obtained. Of note, there is tumor  staining during the radio embolization. Access removed. Hemostasis obtained with the ExoSeal device. No immediate complication. Patient tolerated the procedure well. IMPRESSION: Successful left hepatic Y 90 radio embolization (first treatment). Electronically Signed: By: Jerilynn Mages.  Yvonnie Schinke M.D. On: 08/11/2017 11:11   Ir Embo Tumor Organ Ischemia Infarct Inc Guide Roadmapping  Addendum Date: 08/23/2017   ADDENDUM REPORT: 08/23/2017 10:42 ADDENDUM: To the PROCEDURE: Ultrasound micropuncture access performed of the patent right common femoral artery. Ultrasound images obtained for documentation of femoral artery patency. Electronically Signed   By: Jerilynn Mages.  Caryle Helgeson M.D.   On: 08/23/2017 10:42   Result Date: 08/23/2017 INDICATION: Metastatic neuroendocrine tumor to the liver EXAM: ULTRASOUND GUIDANCE FOR VASCULAR ACCESS SELECTIVE CATHETERIZATION AND ANGIOGRAMS OF THE CELIAC AND LEFT HEPATIC ARTERY LEFT HEPATIC ARTERY Y 90 RADIO EMBOLIZATION MEDICATIONS: 3.375 G ZOSYN. The antibiotic was administered within 1 hour of the procedure ANESTHESIA/SEDATION: Moderate (conscious) sedation was employed during this procedure. A total of Versed 2.0 mg and Fentanyl 100 mcg was administered intravenously. Moderate Sedation Time: 45 minutes. The patient's level of consciousness and vital signs were monitored continuously by radiology nursing throughout the procedure under my direct supervision. CONTRAST:  50 CC ISOVUE FLUOROSCOPY TIME:  Fluoroscopy Time: 6 minutes 30 seconds (199 mGy). COMPLICATIONS: None immediate. PROCEDURE: Informed consent was obtained from the patient following explanation of the procedure, risks, benefits and alternatives. The patient understands, agrees and consents for the procedure. All questions were addressed. A time out was performed prior to the initiation of the procedure. Maximal barrier sterile technique utilized including caps, mask, sterile gowns, sterile gloves, large sterile drape, hand hygiene, and Betadine  prep. under sterile conditions and local anesthesia, ultrasound micropuncture access performed the right common femoral artery. images obtained for documentation. five french sheath inserted over a bentson guidewire. chung 2.5 catheter advanced over guidewire and utilized to select the celiac origin. celiac angiogram performed. celiac: celiac is widely patent including its branches. the splenic, left gastric, hepatic vasculature all patent. there is previous coil embolization of the gastroduodenal artery. There is a very small right gastric artery noted off of the proper hepatic artery. Left and right hepatic vasculature patent. Tumor neovascularity and blushing noted in the left hepatic lobe to the dominant lesion. Renegade high flow microcatheter over a double angled Glidewire was advanced into the left hepatic artery. Selective left hepatic angiogram performed. Left hepatic: Left hepatic artery is patent. Tumor vascularity and staining noted to the dominant lesion in the left hepatic lobe lateral segment correlating with the MRI scan. This is the dominant supply to the left lesion. Y 90 radio embolization: The entire dose of Y90 was successfully delivered into left hepatic artery under intermittent fluoroscopy. Stasis was not obtained.  Of note, there is tumor staining during the radio embolization. Access removed. Hemostasis obtained with the ExoSeal device. No immediate complication. Patient tolerated the procedure well. IMPRESSION: Successful left hepatic Y 90 radio embolization (first treatment). Electronically Signed: By: Jerilynn Mages.  Noelene Gang M.D. On: 08/11/2017 11:11   Ir Radiologist Eval & Mgmt  Result Date: 09/07/2017 Please refer to notes tab for details about interventional procedure. (Op Note)  Nm Radio Pharm Therapy Intraarterial  Result Date: 08/11/2017 CLINICAL DATA:  Unresectable neuroendocrine tumor metastasis to the liver. First treatment to left lobe. EXAM: NUCLEAR MEDICINE SPECIAL MED RAD PHYSICS  CONS; NUCLEAR MEDICINE RADIO PHARM THERAPY INTRA ARTERIAL; NUCLEAR MEDICINE TREATMENT PROCEDURE; NUCLEAR MEDICINE LIVER SCAN TECHNIQUE: In conjunction with the interventional radiologist a Y- Microsphere dose was calculated utilizing body surface area formulation. Calculated dose equal 15.4 mCi. Pre therapy MAA liver SPECT scan and CTA were evaluated. Utilizing a microcatheter system, the left hepatic artery was selected and Y-90 microspheres were delivered in fractionated aliquots. Radiopharmaceutical was delivered by the interventional radiologist and nuclear radiologist. The patient tolerated procedure well. No adverse effects were noted. Bremsstrahlung planar and SPECT imaging of the abdomen following intrahepatic arterial delivery of Y-90 microsphere was performed. RADIOPHARMACEUTICALS:  15.3  MCI Y-90 MICROSPHERES COMPARISON:  MR from 06/27/2017. FINDINGS: Y - 90 microspheres therapy as above. First therapy to the left hepatic lobe. Bremsstrahlung planar and SPECT imaging of the abdomen following intrahepatic arterial delivery of Y-31microsphere demonstrates radioactivity localized to the left hepatic lobe. No evidence of extrahepatic activity. IMPRESSION: Successful Y - 90 microsphere delivery for treatment of unresectable liver metastasis. First therapy to the left lobe. Bremssstrahlung scan demonstrates activity localized to left hepatic lobe with no extrahepatic activity identified. Electronically Signed   By: Kerby Moors M.D.   On: 08/11/2017 14:21    Labs:  CBC: Recent Labs    07/26/17 0748 07/28/17 1017 08/11/17 0738 08/29/17 1021  WBC 11.3* 14.0* 9.9 10.3  HGB 16.0 14.2 15.6 14.0  HCT 49.1 41.8 47.1 40.6  PLT 222 193 231 190    COAGS: Recent Labs    12/17/16 1423 07/26/17 0748 08/11/17 0738  INR 1.0 1.02 0.94  APTT 30  --   --     BMP: Recent Labs    07/26/17 0748 07/28/17 1017 08/11/17 0738 08/29/17 1021  NA 140 138 143 139  K 4.0 4.4 4.2 4.7  CL 102 104 108 104   CO2 26 24 25 26   GLUCOSE 127* 150* 137* 130*  BUN 26* 22* 22* 17  CALCIUM 9.8 9.3 9.5 9.4  CREATININE 0.99 1.05 1.00 1.05  GFRNONAA >60 >60 >60 >60  GFRAA >60 >60 >60 >60    LIVER FUNCTION TESTS: Recent Labs    07/26/17 0748 07/28/17 1017 08/11/17 0738 08/29/17 1021  BILITOT 0.9 1.0 0.8 0.5  AST 31 20 24 18   ALT 22 18 16* 14*  ALKPHOS 116 109 118 110  PROT 8.0 7.3 7.7 6.8  ALBUMIN 4.6 4.0 4.2 3.8    TUMOR MARKERS: Recent Labs    05/05/17 1315 06/30/17 1420 07/28/17 1017 08/29/17 1021  CHROMGRNA 11* 10* 22* 7*    Assessment and Plan:  1 month status post left hepatic Y90 radio embolization for metastatic neuroendocrine tumor to the liver.  Overall he is recovered very well over the last month.  He is back to his baseline functional status.  Treatment day was reviewed as well as the imaging with the patient and his wife.  All questions were addressed.  Plan: Obtain PET dotatate in 3 months to assess for any residual neuroendocrine tumor.  Consider additional Y 90 embolization versus Lutathera  Thank you for this interesting consult.  I greatly enjoyed meeting Jason Malone and look forward to participating in their care.  A copy of this report was sent to the requesting provider on this date.  Electronically Signed: Greggory Keen 09/07/2017, 2:24 PM   I spent a total of    40 Minutes in face to face in clinical consultation, greater than 50% of which was counseling/coordinating care for this patient with metastatic neuroendocrine tumor.

## 2017-09-25 NOTE — Progress Notes (Signed)
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  INTERVAL HISTORY: Patient returns to clinic today for further evaluation and continuation of Lanreotide.  He currently feels well and is asymptomatic.  He denies any weakness or fatigue today.  He does not complain of diarrhea.  He has no neurologic complaints. He denies any recent fevers or illnesses. He has a fair appetite and denies weight loss. He denies any chest pain, shortness of breath, hemoptysis, or cough. He has no nausea, vomiting, or constipation. He has no urinary complaints.  Patient offers no specific complaints today.  REVIEW OF SYSTEMS:   Review of Systems  Constitutional: Negative.  Negative for fever, malaise/fatigue and weight loss.  Respiratory: Negative.  Negative for cough, hemoptysis and shortness of breath.   Cardiovascular: Negative.  Negative for chest pain and leg swelling.  Gastrointestinal: Negative.  Negative for abdominal pain, diarrhea, nausea and vomiting.  Genitourinary: Negative.  Negative for dysuria.  Musculoskeletal: Negative.  Negative for back pain.  Skin: Negative.  Negative for rash.  Neurological: Negative.  Negative for dizziness, sensory change, focal weakness and weakness.  Psychiatric/Behavioral: Negative.  The patient is not nervous/anxious.     As per HPI. Otherwise, a complete review  of systems is negative.  PAST MEDICAL HISTORY: Past Medical History:  Diagnosis Date  . Allergic rhinitis   . Benign prostatic hypertrophy with lower urinary tract symptoms (LUTS)   . Chest pain    a. 05/2013 MV: EF 70%, no ischemia.  . Diverticulitis    DIVERTICULOSIS  . Enlarged prostate   . Essential hypertension    CONTROLLED ON MEDS  . Full dentures    upper and lower  . H/O hypokalemia   . Hx of atrial fibrillation without current medication    CARDIOLOGIST-DR [facility]  . 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 Hyperlipemia   . Hypomagnesemia   . IFG (impaired fasting glucose)   . Liver cancer ([place])   . Liver cancer ([place]) 12/08/2016  . Liver mass, left lobe 08/28/2016   Probably hemangioma; will get MRI of liver, refer to GI  . Lung cancer ([place])    a. carcinoid, left lung, Stage 1b (T2a, N0, cM0);  b. 05/2013 s/p VATS & LULobectomy.  . Monocytosis   . Osteopenia   . Paroxysmal atrial flutter ([facility])    a. 03/2013->no recurrence;  b. CHA2DS2VASc = 2-->not currently on anticoagulation;  c. 05/2012 Echo: EF 55-60%, normal RV.  [name] Wears glasses     PAST SURGICAL HISTORY: Past Surgical History:  Procedure Laterality Date  . BRONCHOSCOPY     04/06/2013  . CARDIOVASCULAR STRESS TEST  05/2013   a. No evidence of ischemia or infarct, EF 70%, no WMAs  . CATARACT EXTRACTION W/PHACO Left 07/14/2015   Procedure: CATARACT EXTRACTION PHACO AND INTRAOCULAR LENS PLACEMENT (IOC);  Surgeon: [name], MD;  Location: [place];  Service: Ophthalmology;  Laterality: Left;  . CATARACT EXTRACTION W/PHACO Right  10/01/2015   Procedure: CATARACT EXTRACTION PHACO AND INTRAOCULAR LENS PLACEMENT (IOC);  Surgeon: [name], MD;  Location: [place];  Service: Ophthalmology;  Laterality: Right;  . COLONOSCOPY W/ POLYPECTOMY     bleed after-had to go to surgery to stop bleeding via colonoscopy  . COLONOSCOPY WITH PROPOFOL N/A 11/19/2015    Procedure: COLONOSCOPY WITH PROPOFOL;  Surgeon: [name], MD;  Location[facility] ENDOSCOPY;  Service: Endoscopy;  Laterality: N/A;  . DUPUYTREN CONTRACTURE RELEASE  12/15/2011   Procedure: DUPUYTREN CONTRACTURE RELEASE;  Surgeon: [name], MD;  Location: [place];  Service: Orthopedics;  Laterality: Left;  Fasciotomy left ring finger dupuytrens  . IR ANGIOGRAM SELECTIVE EACH ADDITIONAL VESSEL  07/26/2017  . IR ANGIOGRAM SELECTIVE EACH ADDITIONAL VESSEL  07/26/2017  . IR ANGIOGRAM SELECTIVE EACH ADDITIONAL VESSEL  07/26/2017  . IR ANGIOGRAM SELECTIVE EACH ADDITIONAL VESSEL  07/26/2017  . IR ANGIOGRAM SELECTIVE EACH ADDITIONAL VESSEL  07/26/2017  . IR ANGIOGRAM SELECTIVE EACH ADDITIONAL VESSEL  08/11/2017  . IR ANGIOGRAM VISCERAL SELECTIVE  07/26/2017  . IR ANGIOGRAM VISCERAL SELECTIVE  08/11/2017  . IR EMBO ARTERIAL NOT HEMORR HEMANG INC GUIDE ROADMAPPING  07/26/2017  . IR EMBO TUMOR ORGAN ISCHEMIA INFARCT INC GUIDE ROADMAPPING  08/11/2017  . IR RADIOLOGIST EVAL & MGMT  07/07/2017  . IR RADIOLOGIST EVAL & MGMT  09/07/2017  . IR US GUIDE VASC ACCESS RIGHT  07/26/2017  . IR US GUIDE VASC ACCESS RIGHT  08/11/2017  . LUNG LOBECTOMY  06/10/13   upper left lung  . MULTIPLE TOOTH EXTRACTIONS    . REMOVAL RETAINED LENS Right 11/17/2015   Procedure: REMOVAL RETAINED LENS FROAGMENTS RIGHT EYE;  Surgeon: [name], MD;  Location: [place];  Service: Ophthalmology;  Laterality: Right;  . TONSILLECTOMY    . UPPER GASTROINTESTINAL ENDOSCOPY  11-03-15   Dr [name]  . VASECTOMY    . VIDEO ASSISTED THORACOSCOPY (VATS)/WEDGE RESECTION Left 06/04/2013   Procedure: VIDEO ASSISTED THORACOSCOPY (VATS)/WEDGE RESECTION;  Surgeon: [name], MD;  Location: [place];  Service: Thoracic;  Laterality: Left;  [name] VIDEO BRONCHOSCOPY N/A 06/04/2013   Procedure: VIDEO BRONCHOSCOPY;  Surgeon: [name], MD;  Location: [facility] OR;  Service: Thoracic;  Laterality: N/A;    FAMILY  HISTORY: Family History  Problem Relation Age of Onset  . Stroke Mother   . Alzheimer's disease Mother   . Hypertension Sister   . Hyperlipidemia Sister   . Hypertension Brother   . Hyperlipidemia Brother   . Diabetes Brother   . Cancer Sister        lung and colon    ADVANCED DIRECTIVES (Y/N):  N  HEALTH MAINTENANCE: Social History   Tobacco Use  . Smoking status: Former Smoker    Packs/day: 1.00    Years: 40.00    Pack years: 40.00    Types: Cigarettes    Last attempt to quit: 12/09/1998    Years since quitting: 18.8  . Smokeless tobacco: Never Used  Substance Use Topics  . Alcohol use: No  . Drug use: No     Colonoscopy:  PAP:  Bone density:  Lipid panel:  Allergies  Allergen Reactions  . Latex Itching    Tape only- REACTED TO BANDAGE ON ABDOMEN  . Tape Itching    Surgical tapes     Current Outpatient Medications  Medication Sig Dispense Refill  . amLODipine (NORVASC) 2.5 MG tablet TAKE ONE TABLET BY MOUTH ONCE DAILY 90 tablet 2  .  atorvastatin (LIPITOR) 40 MG tablet TAKE 1 TABLET BY MOUTH AT BEDTIME FOR CHOLESTEROL 90 tablet 1  . b complex vitamins capsule Take 1 capsule by mouth daily. AM    . Calcium Carbonate-Vitamin D3 (CALCIUM 600/VITAMIN D) 600-400 MG-UNIT TABS Take by mouth daily.    . carvedilol (COREG) 3.125 MG tablet TAKE ONE TABLET BY MOUTH ONCE DAILY IN AM 90 tablet 3  . Cinnamon 500 MG capsule Take 500 mg by mouth 2 (two) times daily. AM AND BEDTIME    . fexofenadine (ALLEGRA) 180 MG tablet Take 180 mg by mouth daily.    . finasteride (PROSCAR) 5 MG tablet TAKE ONE TABLET BY MOUTH ONCE DAILY 90 tablet 3  . losartan (COZAAR) 100 MG tablet TAKE ONE TABLET BY MOUTH ONCE DAILY 90 tablet 3  . Multiple Vitamins-Minerals (MULTIVITAMIN WITH MINERALS) tablet Take 1 tablet by mouth daily. AM    . polycarbophil (FIBERCON) 625 MG tablet Take 625 mg by mouth daily. AM    . tamsulosin (FLOMAX) 0.4 MG CAPS capsule TAKE ONE CAPSULE BY MOUTH ONCE DAILY 90  capsule 3  . vitamin C (ASCORBIC ACID) 500 MG tablet Take 500 mg by mouth daily. AM    . polyethylene glycol (MIRALAX / GLYCOLAX) packet Take 17 g by mouth daily.     No current facility-administered medications for this visit.     OBJECTIVE: Vitals:   09/28/17 1010  BP: 135/80  Pulse: (!) 54  Resp: 18  Temp: (!) 96.5 F (35.8 C)     Body mass index is 27.24 kg/m.    ECOG FS:0 - Asymptomatic  General: Well-developed, well-nourished, no acute distress. Eyes: Pink conjunctiva, anicteric sclera. Lungs: Clear to auscultation bilaterally. Heart: Regular rate and rhythm. No rubs, murmurs, or gallops. Abdomen: Soft, nontender, nondistended. No organomegaly noted, normoactive bowel sounds. Musculoskeletal: No edema, cyanosis, or clubbing. Neuro: Alert, answering all questions appropriately. Cranial nerves grossly intact. Skin: No rashes or petechiae noted. Psych: Normal affect.  LAB RESULTS:  Lab Results  Component Value Date   NA 140 09/28/2017   K 4.7 09/28/2017   CL 106 09/28/2017   CO2 26 09/28/2017   GLUCOSE 142 (H) 09/28/2017   BUN 19 09/28/2017   CREATININE 0.98 09/28/2017   CALCIUM 9.8 09/28/2017   PROT 7.0 09/28/2017   ALBUMIN 4.0 09/28/2017   AST 27 09/28/2017   ALT 24 09/28/2017   ALKPHOS 105 09/28/2017   BILITOT 0.7 09/28/2017   GFRNONAA >60 09/28/2017   GFRAA >60 09/28/2017    Lab Results  Component Value Date   WBC 8.0 09/28/2017   NEUTROABS 5.8 09/28/2017   HGB 14.7 09/28/2017   HCT 42.5 09/28/2017   MCV 89.9 09/28/2017   PLT 157 09/28/2017     STUDIES: Ir Radiologist Eval & Mgmt  Result Date: 09/07/2017 Please refer to notes tab for details about interventional procedure. 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  PLAN:    1. 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 Patient underwent  left upper lobe lobectomy in [place] on June 04, 2013. Final pathology results revealed a well differentiated neuroendocrine tumor with positive bronchial margins. Patient did not receive adjuvant XRT or chemotherapy at that time.  MRI results from June 27, 2017 reviewed independently with progression of patient's known disease in his liver.  His chromogranin A levels are also trending up slightly.  Patient had his most recent Y-90 injection on August 11, 2017.  We will plan to repeat imaging approximately 3 months after his ablation in July 2019.  Continue follow-up with IR as scheduled.  Received with 120 mg subcutaneous Lanreotide every 4 weeks.  Return to clinic in 4 weeks for further evaluation and continuation of Lanreotide. 2.  Diarrhea: Patient does not complain of this today.  Approximately 30 minutes was spent in discussion of which greater than 50% was consultation.  Patient expressed understanding and was in agreement with this plan. He also understands that He can call clinic at any time with any questions, concerns, or complaints.   Cancer Staging Neuroendocrine carcinoma of lung [facility]) Staging form: Lung, AJCC 8th Edition - Clinical stage from 12/28/2016: Stage IVA (cT2a, cN0, cM1b) - Signed by Lloyd Huger, MD on 12/28/2016   Lloyd Huger, MD   10/02/2017 9:05 AM

## 2017-09-28 ENCOUNTER — Inpatient Hospital Stay: Payer: Medicare Other | Attending: Oncology

## 2017-09-28 ENCOUNTER — Inpatient Hospital Stay: Payer: Medicare Other

## 2017-09-28 ENCOUNTER — Other Ambulatory Visit: Payer: Self-pay

## 2017-09-28 ENCOUNTER — Inpatient Hospital Stay (HOSPITAL_BASED_OUTPATIENT_CLINIC_OR_DEPARTMENT_OTHER): Payer: Medicare Other | Admitting: Oncology

## 2017-09-28 VITALS — BP 135/80 | HR 54 | Temp 96.5°F | Resp 18 | Wt 158.7 lb

## 2017-09-28 DIAGNOSIS — I4891 Unspecified atrial fibrillation: Secondary | ICD-10-CM | POA: Diagnosis not present

## 2017-09-28 DIAGNOSIS — C7B02 Secondary carcinoid tumors of liver: Secondary | ICD-10-CM | POA: Insufficient documentation

## 2017-09-28 DIAGNOSIS — I4892 Unspecified atrial flutter: Secondary | ICD-10-CM | POA: Insufficient documentation

## 2017-09-28 DIAGNOSIS — E785 Hyperlipidemia, unspecified: Secondary | ICD-10-CM | POA: Diagnosis not present

## 2017-09-28 DIAGNOSIS — M8580 Other specified disorders of bone density and structure, unspecified site: Secondary | ICD-10-CM | POA: Diagnosis not present

## 2017-09-28 DIAGNOSIS — I10 Essential (primary) hypertension: Secondary | ICD-10-CM | POA: Diagnosis not present

## 2017-09-28 DIAGNOSIS — Z87891 Personal history of nicotine dependence: Secondary | ICD-10-CM | POA: Diagnosis not present

## 2017-09-28 DIAGNOSIS — C7A8 Other malignant neuroendocrine tumors: Secondary | ICD-10-CM

## 2017-09-28 DIAGNOSIS — N400 Enlarged prostate without lower urinary tract symptoms: Secondary | ICD-10-CM | POA: Insufficient documentation

## 2017-09-28 DIAGNOSIS — Z79899 Other long term (current) drug therapy: Secondary | ICD-10-CM | POA: Insufficient documentation

## 2017-09-28 DIAGNOSIS — C7B8 Other secondary neuroendocrine tumors: Secondary | ICD-10-CM

## 2017-09-28 LAB — COMPREHENSIVE METABOLIC PANEL
ALT: 24 U/L (ref 17–63)
ANION GAP: 8 (ref 5–15)
AST: 27 U/L (ref 15–41)
Albumin: 4 g/dL (ref 3.5–5.0)
Alkaline Phosphatase: 105 U/L (ref 38–126)
BUN: 19 mg/dL (ref 6–20)
CO2: 26 mmol/L (ref 22–32)
Calcium: 9.8 mg/dL (ref 8.9–10.3)
Chloride: 106 mmol/L (ref 101–111)
Creatinine, Ser: 0.98 mg/dL (ref 0.61–1.24)
GFR calc Af Amer: >60 mL/min (ref >60)
Glucose, Bld: 142 mg/dL — ABNORMAL HIGH (ref 65–99)
POTASSIUM: 4.7 mmol/L (ref 3.5–5.1)
Sodium: 140 mmol/L (ref 135–145)
TOTAL PROTEIN: 7 g/dL (ref 6.5–8.1)
Total Bilirubin: 0.7 mg/dL (ref 0.3–1.2)

## 2017-09-28 LAB — CBC WITH DIFFERENTIAL/PLATELET
BASOS ABS: 0.1 10*3/uL (ref 0–0.1)
Basophils Relative: 1 %
Eosinophils Absolute: 0.3 10*3/uL (ref 0–0.7)
Eosinophils Relative: 4 %
HCT: 42.5 % (ref 40.0–52.0)
Hemoglobin: 14.7 g/dL (ref 13.0–18.0)
Lymphocytes Relative: 8 %
Lymphs Abs: 0.6 10*3/uL — ABNORMAL LOW (ref 1.0–3.6)
MCH: 31.2 pg (ref 26.0–34.0)
MCHC: 34.6 g/dL (ref 32.0–36.0)
MCV: 89.9 fL (ref 80.0–100.0)
MONO ABS: 1.2 10*3/uL — AB (ref 0.2–1.0)
Monocytes Relative: 15 %
Neutro Abs: 5.8 10*3/uL (ref 1.4–6.5)
Neutrophils Relative %: 72 %
Platelets: 157 10*3/uL (ref 150–440)
RBC: 4.73 MIL/uL (ref 4.40–5.90)
RDW: 14.1 % (ref 11.5–14.5)
WBC: 8 10*3/uL (ref 3.8–10.6)

## 2017-09-28 MED ORDER — LANREOTIDE ACETATE 120 MG/0.5ML ~~LOC~~ SOLN
120.0000 mg | Freq: Once | SUBCUTANEOUS | Status: AC
  Administered 2017-09-28: 120 mg via SUBCUTANEOUS
  Filled 2017-09-28: qty 120

## 2017-09-28 NOTE — Progress Notes (Signed)
Here for follow up" doing fine" per pt

## 2017-09-29 LAB — CHROMOGRANIN A: CHROMOGRANIN A: 5 nmol/L (ref 0–5)

## 2017-10-04 ENCOUNTER — Encounter: Payer: Self-pay | Admitting: Family Medicine

## 2017-10-04 ENCOUNTER — Ambulatory Visit (INDEPENDENT_AMBULATORY_CARE_PROVIDER_SITE_OTHER): Payer: Medicare Other | Admitting: Family Medicine

## 2017-10-04 VITALS — BP 116/60 | HR 64 | Temp 98.3°F | Resp 14 | Ht 64.0 in | Wt 160.8 lb

## 2017-10-04 DIAGNOSIS — I4892 Unspecified atrial flutter: Secondary | ICD-10-CM | POA: Diagnosis not present

## 2017-10-04 DIAGNOSIS — J301 Allergic rhinitis due to pollen: Secondary | ICD-10-CM

## 2017-10-04 DIAGNOSIS — E782 Mixed hyperlipidemia: Secondary | ICD-10-CM

## 2017-10-04 DIAGNOSIS — Z87891 Personal history of nicotine dependence: Secondary | ICD-10-CM

## 2017-10-04 DIAGNOSIS — I700 Atherosclerosis of aorta: Secondary | ICD-10-CM

## 2017-10-04 DIAGNOSIS — C7A8 Other malignant neuroendocrine tumors: Secondary | ICD-10-CM | POA: Diagnosis not present

## 2017-10-04 DIAGNOSIS — R7301 Impaired fasting glucose: Secondary | ICD-10-CM

## 2017-10-04 NOTE — Assessment & Plan Note (Signed)
Goal LDL should be less than 70

## 2017-10-04 NOTE — Assessment & Plan Note (Signed)
manged by oncologist

## 2017-10-04 NOTE — Assessment & Plan Note (Signed)
Okay with OTC products

## 2017-10-04 NOTE — Assessment & Plan Note (Signed)
Quit more than 15 years ago; no passive smoke

## 2017-10-04 NOTE — Assessment & Plan Note (Signed)
Check A1c; likely affected by chemo

## 2017-10-04 NOTE — Progress Notes (Signed)
BP 116/60   Pulse 64   Temp 98.3 F (36.8 C) (Oral)   Resp 14   Ht 5\' 4"  (1.626 m)   Wt 160 lb 12.8 oz (72.9 kg)   SpO2 93%   BMI 27.60 kg/m    Subjective:    Patient ID: Jason Malone, male    DOB: August 15, 1940, 77 y.o.   MRN: [id number]  HPI: Jason Malone is a 77 y.o. male  Chief Complaint  Patient presents with  . Follow-up    HPI Here for f/u; saw cancer doctor; goes back every month for the cancer; seeing him regularly Going to see Dr. Reesa Chew in August at [facility]; no diarrhea; if anything, a little constipated Some fatigue some days, but better than a month ago HTN; can check BP at home  Dr. Rockey Situ saw him in November; atrial flutter; heart rate better today; usually around 60; no chest pain  Prediabetes; no vision changes, no dry mouth; not a strong fam hx of diabetes, just one half-brother but maybe other side of the family  High cholesterol; no bologna in two years; likes sausage; likes cheese  Allergic rhinitis; hit pretty hard this year, but better now than two to three weeks ago  Depression screen [facility] 2/9 10/04/2017 02/14/2017 01/12/2017 12/17/2016 08/13/2016  Decreased Interest 0 0 0 0 0  Down, Depressed, Hopeless 0 0 0 0 0  PHQ - 2 Score 0 0 0 0 0    Relevant past medical, surgical, family and social history reviewed Past Medical History:  Diagnosis Date  . Allergic rhinitis   . Benign prostatic hypertrophy with lower urinary tract symptoms (LUTS)   . Chest pain    a. 05/2013 MV: EF 70%, no ischemia.  . Diverticulitis    DIVERTICULOSIS  . Enlarged prostate   . Essential hypertension    CONTROLLED ON MEDS  . Full dentures    upper and lower  . H/O hypokalemia   . Hx of atrial fibrillation without current medication    CARDIOLOGIST-DR [facility]  . 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 Hyperlipemia   . Hypomagnesemia   . IFG (impaired fasting glucose)   . Liver cancer ([place])   . Liver cancer ([place]) 12/08/2016  .  Liver mass, left lobe 08/28/2016   Probably hemangioma; will get MRI of liver, refer to GI  . Lung cancer ([place])    a. carcinoid, left lung, Stage 1b (T2a, N0, cM0);  b. 05/2013 s/p VATS & LULobectomy.  . Monocytosis   . Osteopenia   . Paroxysmal atrial flutter (HCC)    a. 03/2013->no recurrence;  b. CHA2DS2VASc = 2-->not currently on anticoagulation;  c. 05/2012 Echo: EF 55-60%, normal RV.  [name] Wears glasses    Past Surgical History:  Procedure Laterality Date  . BRONCHOSCOPY     04/06/2013  . CARDIOVASCULAR STRESS TEST  05/2013   a. No evidence of ischemia or infarct, EF 70%, no WMAs  . CATARACT EXTRACTION W/PHACO Left 07/14/2015   Procedure: CATARACT EXTRACTION PHACO AND INTRAOCULAR LENS PLACEMENT (IOC);  Surgeon: [name], MD;  Location: [place];  Service: Ophthalmology;  Laterality: Left;  . CATARACT EXTRACTION W/PHACO Right 10/01/2015   Procedure: CATARACT EXTRACTION PHACO AND INTRAOCULAR LENS PLACEMENT (IOC);  Surgeon: [name], MD;  Location: [place];  Service: Ophthalmology;  Laterality: Right;  . COLONOSCOPY W/ POLYPECTOMY     bleed after-had to go to surgery to stop bleeding via  colonoscopy  . COLONOSCOPY WITH PROPOFOL N/A 11/19/2015   Procedure: COLONOSCOPY WITH PROPOFOL;  Surgeon: [name], MD;  Location[facility] ENDOSCOPY;  Service: Endoscopy;  Laterality: N/A;  . DUPUYTREN CONTRACTURE RELEASE  12/15/2011   Procedure: DUPUYTREN CONTRACTURE RELEASE;  Surgeon: [name], MD;  Location: [place];  Service: Orthopedics;  Laterality: Left;  Fasciotomy left ring finger dupuytrens  . IR ANGIOGRAM SELECTIVE EACH ADDITIONAL VESSEL  07/26/2017  . IR ANGIOGRAM SELECTIVE EACH ADDITIONAL VESSEL  07/26/2017  . IR ANGIOGRAM SELECTIVE EACH ADDITIONAL VESSEL  07/26/2017  . IR ANGIOGRAM SELECTIVE EACH ADDITIONAL VESSEL  07/26/2017  . IR ANGIOGRAM SELECTIVE EACH ADDITIONAL VESSEL  07/26/2017  . IR ANGIOGRAM SELECTIVE EACH ADDITIONAL  VESSEL  08/11/2017  . IR ANGIOGRAM VISCERAL SELECTIVE  07/26/2017  . IR ANGIOGRAM VISCERAL SELECTIVE  08/11/2017  . IR EMBO ARTERIAL NOT HEMORR HEMANG INC GUIDE ROADMAPPING  07/26/2017  . IR EMBO TUMOR ORGAN ISCHEMIA INFARCT INC GUIDE ROADMAPPING  08/11/2017  . IR RADIOLOGIST EVAL & MGMT  07/07/2017  . IR RADIOLOGIST EVAL & MGMT  09/07/2017  . IR US GUIDE VASC ACCESS RIGHT  07/26/2017  . IR US GUIDE VASC ACCESS RIGHT  08/11/2017  . LUNG LOBECTOMY  06/10/13   upper left lung  . MULTIPLE TOOTH EXTRACTIONS    . REMOVAL RETAINED LENS Right 11/17/2015   Procedure: REMOVAL RETAINED LENS FROAGMENTS RIGHT EYE;  Surgeon: [name], MD;  Location: [place];  Service: Ophthalmology;  Laterality: Right;  . TONSILLECTOMY    . UPPER GASTROINTESTINAL ENDOSCOPY  11-03-15   Dr [name]  . VASECTOMY    . VIDEO ASSISTED THORACOSCOPY (VATS)/WEDGE RESECTION Left 06/04/2013   Procedure: VIDEO ASSISTED THORACOSCOPY (VATS)/WEDGE RESECTION;  Surgeon: [name], MD;  Location: [place];  Service: Thoracic;  Laterality: Left;  [name] VIDEO BRONCHOSCOPY N/A 06/04/2013   Procedure: VIDEO BRONCHOSCOPY;  Surgeon: [name], MD;  Location: [facility] OR;  Service: Thoracic;  Laterality: N/A;   Family History  Problem Relation Age of Onset  . Stroke Mother   . Alzheimer's disease Mother   . Hypertension Sister   . Hyperlipidemia Sister   . Hypertension Brother   . Hyperlipidemia Brother   . Diabetes Brother   . Cancer Sister        lung and colon   Social History   Tobacco Use  . Smoking status: Former Smoker    Packs/day: 1.00    Years: 40.00    Pack years: 40.00    Types: Cigarettes    Last attempt to quit: 12/09/1998    Years since quitting: 18.8  . Smokeless tobacco: Never Used  Substance Use Topics  . Alcohol use: No  . Drug use: No    Interim medical history since last visit reviewed. Allergies and medications reviewed  Review of Systems Per HPI unless specifically indicated above       Objective:    BP 116/60   Pulse 64   Temp 98.3 F (36.8 C) (Oral)   Resp 14   Ht 5\' 4"  (1.626 m)   Wt 160 lb 12.8 oz (72.9 kg)   SpO2 93%   BMI 27.60 kg/m   Wt Readings from Last 3 Encounters:  10/04/17 160 lb 12.8 oz (72.9 kg)  09/28/17 158 lb 11.2 oz (72 kg)  08/29/17 158 lb 8 oz (71.9 kg)    Physical Exam  Constitutional: He appears well-developed and well-nourished. No distress.  HENT:  Head: Normocephalic and atraumatic.  Eyes: EOM are normal. No scleral icterus.  Neck: No thyromegaly present.  Cardiovascular: Normal rate and regular rhythm.  Pulmonary/Chest: Effort normal and breath sounds normal.  Abdominal: Soft. Bowel sounds are normal. He exhibits no distension.  Musculoskeletal: He exhibits no edema.  Neurological: Coordination normal.  Skin: Skin is warm and dry. No pallor.  Psychiatric: He has a normal mood and affect. His behavior is normal. Judgment and thought content normal.    Results for orders placed or performed in visit on 09/28/17  Comprehensive metabolic panel  Result Value Ref Range   Sodium 140 135 - 145 mmol/L   Potassium 4.7 3.5 - 5.1 mmol/L   Chloride 106 101 - 111 mmol/L   CO2 26 22 - 32 mmol/L   Glucose, Bld 142 (H) 65 - 99 mg/dL   BUN 19 6 - 20 mg/dL   Creatinine, Ser 0.98 0.61 - 1.24 mg/dL   Calcium 9.8 8.9 - 10.3 mg/dL   Total Protein 7.0 6.5 - 8.1 g/dL   Albumin 4.0 3.5 - 5.0 g/dL   AST 27 15 - 41 U/L   ALT 24 17 - 63 U/L   Alkaline Phosphatase 105 38 - 126 U/L   Total Bilirubin 0.7 0.3 - 1.2 mg/dL   GFR calc non Af Amer >60 >60 mL/min   GFR calc Af Amer >60 >60 mL/min   Anion gap 8 5 - 15  CBC with Differential/Platelet  Result Value Ref Range   WBC 8.0 3.8 - 10.6 K/uL   RBC 4.73 4.40 - 5.90 MIL/uL   Hemoglobin 14.7 13.0 - 18.0 g/dL   HCT 42.5 40.0 - 52.0 %   MCV 89.9 80.0 - 100.0 fL   MCH 31.2 26.0 - 34.0 pg   MCHC 34.6 32.0 - 36.0 g/dL   RDW 14.1 11.5 - 14.5 %   Platelets 157 150 - 440 K/uL   Neutrophils Relative %  72 %   Neutro Abs 5.8 1.4 - 6.5 K/uL   Lymphocytes Relative 8 %   Lymphs Abs 0.6 (L) 1.0 - 3.6 K/uL   Monocytes Relative 15 %   Monocytes Absolute 1.2 (H) 0.2 - 1.0 K/uL   Eosinophils Relative 4 %   Eosinophils Absolute 0.3 0 - 0.7 K/uL   Basophils Relative 1 %   Basophils Absolute 0.1 0 - 0.1 K/uL  Chromogranin A  Result Value Ref Range   Chromogranin A 5 0 - 5 nmol/L      Assessment & Plan:   Problem List Items Addressed This Visit      Cardiovascular and Mediastinum   Paroxysmal atrial flutter (HCC)    Regular rate and rhythm today      Aortic atherosclerosis (HCC)    Goal LDL should be less than 70        Respiratory   Neuroendocrine carcinoma of lung ([place])    manged by oncologist      Allergic rhinitis    Okay with OTC products        Endocrine   IFG (impaired fasting glucose)    Check A1c; likely affected by chemo      Relevant Orders   Hemoglobin A1c     Other   Smoking history    Quit more than 15 years ago; no passive smoke      Hyperlipidemia - Primary    Limit saturated fats; calculate 10 year risk when numbers come back tomorrow      Relevant Orders   Lipid panel  Follow up plan: Return in about 6 months (around 04/06/2018) for follow-up visit with Dr. Sanda Klein.  An after-visit summary was printed and given to the patient at [place].  Please see the patient instructions which may contain other information and recommendations beyond what is mentioned above in the assessment and plan.  No orders of the defined types were placed in this encounter.   Orders Placed This Encounter  Procedures  . Hemoglobin A1c  . Lipid panel

## 2017-10-04 NOTE — Assessment & Plan Note (Signed)
Limit saturated fats; calculate 10 year risk when numbers come back tomorrow

## 2017-10-04 NOTE — Assessment & Plan Note (Signed)
Regular rate and rhythm today

## 2017-10-04 NOTE — Patient Instructions (Signed)
Try to limit saturated fats in your diet (bologna, hot dogs, barbeque, cheeseburgers, hamburgers, steak, bacon, sausage, cheese, etc.) and get more fresh fruits, vegetables, and whole grains Try to follow the DASH guidelines (DASH stands for Dietary Approaches to Stop Hypertension). Try to limit the sodium in your diet to no more than 1,500mg  of sodium per day. Certainly try to not exceed 2,000 mg per day at the very most. Do not add salt when cooking or at the table.  Check the sodium amount on labels when shopping, and choose items lower in sodium when given a choice. Avoid or limit foods that already contain a lot of sodium. Eat a diet rich in fruits and vegetables and whole grains, and try to lose weight if overweight or obese Okay with me to not take the amlodipine if desired / blood pressure low

## 2017-10-05 LAB — HEMOGLOBIN A1C
Hgb A1c MFr Bld: 6.4 % of total Hgb — ABNORMAL HIGH (ref <5.7)
Mean Plasma Glucose: 137 (calc)
eAG (mmol/L): 7.6 (calc)

## 2017-10-05 LAB — LIPID PANEL
CHOL/HDL RATIO: 3.2 (calc) (ref <5.0)
CHOLESTEROL: 120 mg/dL (ref <200)
HDL: 38 mg/dL — AB (ref >40)
LDL CHOLESTEROL (CALC): 55 mg/dL
Non-HDL Cholesterol (Calc): 82 mg/dL (calc) (ref <130)
TRIGLYCERIDES: 197 mg/dL — AB (ref <150)

## 2017-10-11 ENCOUNTER — Other Ambulatory Visit: Payer: Self-pay | Admitting: Family Medicine

## 2017-10-24 NOTE — Progress Notes (Signed)
[place]  Telephone:([phone]) [phone] [phone]) [phone]  ID: Jason Malone OB: 1940/08/05  MR#: [id number]  JEH#:[id number]  Patient Care Team: Arnetha Courser, MD as PCP - General (Family Medicine) [name], Forest Gleason, MD (General Surgery) Nestor Lewandowsky, MD as Referring Physician (Cardiothoracic Surgery) Minna Merritts, MD as Consulting Physician (Cardiology) [name], MD as Consulting Physician (Cardiothoracic Surgery) Lloyd Huger, MD as Consulting Physician (Oncology) Sanda Klein Satira Anis, MD as Attending Physician (Family Medicine)  CHIEF COMPLAINT: 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  INTERVAL HISTORY: Patient returns to clinic today for further evaluation and continuation of Lanreotide.  He continues to feel well and remains asymptomatic.  He does not complain of any abdominal pain.  He has no further weakness or fatigue.  He does not complain of diarrhea.  He has no neurologic complaints. He denies any recent fevers or illnesses. He has a fair appetite and denies weight loss. He denies any chest pain, shortness of breath, hemoptysis, or cough. He has no nausea, vomiting, or constipation. He has no urinary complaints.  Patient feels at his baseline offers no specific complaints today.  REVIEW OF SYSTEMS:   Review of Systems  Constitutional: Negative.  Negative for fever, malaise/fatigue and weight loss.  Respiratory: Negative.  Negative for cough, hemoptysis and shortness of breath.   Cardiovascular: Negative.  Negative for chest pain and leg swelling.  Gastrointestinal: Negative.  Negative for abdominal pain, diarrhea, nausea and vomiting.  Genitourinary: Negative.  Negative for dysuria.  Musculoskeletal: Negative.  Negative for back pain.  Skin: Negative.  Negative for rash.  Neurological: Negative.  Negative for dizziness, sensory change, focal weakness and weakness.  Psychiatric/Behavioral: Negative.  The patient  is not nervous/anxious.     As per HPI. Otherwise, a complete review of systems is negative.  PAST MEDICAL HISTORY: Past Medical History:  Diagnosis Date  . Allergic rhinitis   . Benign prostatic hypertrophy with lower urinary tract symptoms (LUTS)   . Chest pain    a. 05/2013 MV: EF 70%, no ischemia.  . Diverticulitis    DIVERTICULOSIS  . Enlarged prostate   . Essential hypertension    CONTROLLED ON MEDS  . Full dentures    upper and lower  . H/O hypokalemia   . Hx of atrial fibrillation without current medication    CARDIOLOGIST-DR [employer]  . 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 Hyperlipemia   . Hypomagnesemia   . IFG (impaired fasting glucose)   . Liver cancer ([place])   . Liver cancer ([place]) 12/08/2016  . Liver mass, left lobe 08/28/2016   Probably hemangioma; will get MRI of liver, refer to GI  . Lung cancer ([place])    a. carcinoid, left lung, Stage 1b (T2a, N0, cM0);  b. 05/2013 s/p VATS & LULobectomy.  . Monocytosis   . Osteopenia   . Paroxysmal atrial flutter (HCC)    a. 03/2013->no recurrence;  b. CHA2DS2VASc = 2-->not currently on anticoagulation;  c. 05/2012 Echo: EF 55-60%, normal RV.  [name] Wears glasses     PAST SURGICAL HISTORY: Past Surgical History:  Procedure Laterality Date  . BRONCHOSCOPY     04/06/2013  . CARDIOVASCULAR STRESS TEST  05/2013   a. No evidence of ischemia or infarct, EF 70%, no WMAs  . CATARACT EXTRACTION W/PHACO Left 07/14/2015   Procedure: CATARACT EXTRACTION PHACO AND INTRAOCULAR LENS PLACEMENT (IOC);  Surgeon: [name], MD;  [facility];  Service: Ophthalmology;  Laterality: Left;  . CATARACT EXTRACTION W/PHACO Right 10/01/2015   Procedure: CATARACT EXTRACTION PHACO AND INTRAOCULAR LENS PLACEMENT (IOC);  Surgeon: [name], MD;  Location: [place];  Service: Ophthalmology;  Laterality: Right;  . COLONOSCOPY W/ POLYPECTOMY     bleed after-had to go to surgery to  stop bleeding via colonoscopy  . COLONOSCOPY WITH PROPOFOL N/A 11/19/2015   Procedure: COLONOSCOPY WITH PROPOFOL;  Surgeon: [name], MD;  Location: [facility] ENDOSCOPY;  Service: Endoscopy;  Laterality: N/A;  . DUPUYTREN CONTRACTURE RELEASE  12/15/2011   Procedure: DUPUYTREN CONTRACTURE RELEASE;  Surgeon: [name], MD;  Location: [place];  Service: Orthopedics;  Laterality: Left;  Fasciotomy left ring finger dupuytrens  . IR ANGIOGRAM SELECTIVE EACH ADDITIONAL VESSEL  07/26/2017  . IR ANGIOGRAM SELECTIVE EACH ADDITIONAL VESSEL  07/26/2017  . IR ANGIOGRAM SELECTIVE EACH ADDITIONAL VESSEL  07/26/2017  . IR ANGIOGRAM SELECTIVE EACH ADDITIONAL VESSEL  07/26/2017  . IR ANGIOGRAM SELECTIVE EACH ADDITIONAL VESSEL  07/26/2017  . IR ANGIOGRAM SELECTIVE EACH ADDITIONAL VESSEL  08/11/2017  . IR ANGIOGRAM VISCERAL SELECTIVE  07/26/2017  . IR ANGIOGRAM VISCERAL SELECTIVE  08/11/2017  . IR EMBO ARTERIAL NOT HEMORR HEMANG INC GUIDE ROADMAPPING  07/26/2017  . IR EMBO TUMOR ORGAN ISCHEMIA INFARCT INC GUIDE ROADMAPPING  08/11/2017  . IR RADIOLOGIST EVAL & MGMT  07/07/2017  . IR RADIOLOGIST EVAL & MGMT  09/07/2017  . IR US GUIDE VASC ACCESS RIGHT  07/26/2017  . IR US GUIDE VASC ACCESS RIGHT  08/11/2017  . LUNG LOBECTOMY  06/10/13   upper left lung  . MULTIPLE TOOTH EXTRACTIONS    . REMOVAL RETAINED LENS Right 11/17/2015   Procedure: REMOVAL RETAINED LENS FROAGMENTS RIGHT EYE;  Surgeon: [name], MD;  Location: [place];  Service: Ophthalmology;  Laterality: Right;  . TONSILLECTOMY    . UPPER GASTROINTESTINAL ENDOSCOPY  11-03-15   Dr [name]  . VASECTOMY    . VIDEO ASSISTED THORACOSCOPY (VATS)/WEDGE RESECTION Left 06/04/2013   Procedure: VIDEO ASSISTED THORACOSCOPY (VATS)/WEDGE RESECTION;  Surgeon: [name], MD;  Location: [place];  Service: Thoracic;  Laterality: Left;  [name] VIDEO BRONCHOSCOPY N/A 06/04/2013   Procedure: VIDEO BRONCHOSCOPY;  Surgeon: [name], MD;   Location: [facility] OR;  Service: Thoracic;  Laterality: N/A;    FAMILY HISTORY: Family History  Problem Relation Age of Onset  . Stroke Mother   . Alzheimer's disease Mother   . Hypertension Sister   . Hyperlipidemia Sister   . Hypertension Brother   . Hyperlipidemia Brother   . Diabetes Brother   . Cancer Sister        lung and colon    ADVANCED DIRECTIVES (Y/N):  N  HEALTH MAINTENANCE: Social History   Tobacco Use  . Smoking status: Former Smoker    Packs/day: 1.00    Years: 40.00    Pack years: 40.00    Types: Cigarettes    Last attempt to quit: 12/09/1998    Years since quitting: 18.9  . Smokeless tobacco: Never Used  Substance Use Topics  . Alcohol use: No  . Drug use: No     Colonoscopy:  PAP:  Bone density:  Lipid panel:  Allergies  Allergen Reactions  . Latex Itching    Tape only- REACTED TO BANDAGE ON ABDOMEN  . Tape Itching    Surgical tapes     Current Outpatient Medications  Medication Sig Dispense Refill  . amLODipine (NORVASC)  2.5 MG tablet TAKE ONE TABLET BY MOUTH ONCE DAILY 90 tablet 2  . atorvastatin (LIPITOR) 40 MG tablet TAKE 1 TABLET BY MOUTH AT BEDTIME FOR CHOLESTEROL 90 tablet 1  . b complex vitamins capsule Take 1 capsule by mouth daily. AM    . Calcium Carbonate-Vitamin D3 (CALCIUM 600/VITAMIN D) 600-400 MG-UNIT TABS Take by mouth daily.    . carvedilol (COREG) 3.125 MG tablet TAKE ONE TABLET BY MOUTH ONCE DAILY IN AM 90 tablet 3  . Cinnamon 500 MG capsule Take 500 mg by mouth 2 (two) times daily. AM AND BEDTIME    . fexofenadine (ALLEGRA) 180 MG tablet Take 180 mg by mouth daily.    . finasteride (PROSCAR) 5 MG tablet TAKE 1 TABLET BY MOUTH ONCE DAILY 90 tablet 3  . losartan (COZAAR) 100 MG tablet TAKE ONE TABLET BY MOUTH ONCE DAILY 90 tablet 3  . Multiple Vitamins-Minerals (MULTIVITAMIN WITH MINERALS) tablet Take 1 tablet by mouth daily. AM    . polycarbophil (FIBERCON) 625 MG tablet Take 625 mg by mouth daily. AM    . polyethylene  glycol (MIRALAX / GLYCOLAX) packet Take 17 g by mouth daily as needed.     . tamsulosin (FLOMAX) 0.4 MG CAPS capsule TAKE ONE CAPSULE BY MOUTH ONCE DAILY 90 capsule 3  . vitamin C (ASCORBIC ACID) 500 MG tablet Take 500 mg by mouth daily. AM     No current facility-administered medications for this visit.     OBJECTIVE: Vitals:   10/27/17 1029  BP: 126/78  Pulse: (!) 54  Resp: 16  Temp: (!) 97 F (36.1 C)     Body mass index is 27.21 kg/m.    ECOG FS:0 - Asymptomatic  General: Well-developed, well-nourished, no acute distress. Eyes: Pink conjunctiva, anicteric sclera. Lungs: Clear to auscultation bilaterally. Heart: Regular rate and rhythm. No rubs, murmurs, or gallops. Abdomen: Soft, nontender, nondistended. No organomegaly noted, normoactive bowel sounds. Musculoskeletal: No edema, cyanosis, or clubbing. Neuro: Alert, answering all questions appropriately. Cranial nerves grossly intact. Skin: No rashes or petechiae noted. Psych: Normal affect.  LAB RESULTS:  Lab Results  Component Value Date   NA 140 10/27/2017   K 4.1 10/27/2017   CL 109 10/27/2017   CO2 24 10/27/2017   GLUCOSE 141 (H) 10/27/2017   BUN 19 10/27/2017   CREATININE 0.93 10/27/2017   CALCIUM 9.7 10/27/2017   PROT 7.0 10/27/2017   ALBUMIN 4.1 10/27/2017   AST 26 10/27/2017   ALT 25 10/27/2017   ALKPHOS 147 (H) 10/27/2017   BILITOT 0.9 10/27/2017   GFRNONAA >60 10/27/2017   GFRAA >60 10/27/2017    Lab Results  Component Value Date   WBC 9.1 10/27/2017   NEUTROABS 7.0 (H) 10/27/2017   HGB 14.8 10/27/2017   HCT 43.3 10/27/2017   MCV 90.0 10/27/2017   PLT 166 10/27/2017     STUDIES: No results found.  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  PLAN:    1. 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 Patient underwent left upper lobe lobectomy in  [place] on June 04, 2013. Final pathology results revealed a well differentiated neuroendocrine tumor with positive bronchial margins. Patient did not receive adjuvant XRT or chemotherapy at that time.  MRI results from June 27, 2017 reviewed independently with progression of patient's known disease in his liver.  His chromogranin A levels are also trending up slightly.  Patient had his most recent Y-90 injection on August 11, 2017.  Repeat imaging approximately 3 months after his ablation in July 2019.  This will likely be ordered by interventional radiology.  Continue follow-up with IR as scheduled.  Proceed with 120 mg subcutaneous Lanreotide today.  Return to clinic in 4 weeks for repeat laboratory work and continuation of treatment and then in 8 weeks for further evaluation.   2.  Diarrhea: Patient does not complain of this today.  I spent a total of 30 minutes face-to-face with the patient of which greater than 50% of the visit was spent in counseling and coordination of care as summarized above.  Patient expressed understanding and was in agreement with this plan. He also understands that He can call clinic at any time with any questions, concerns, or complaints.   Cancer Staging Neuroendocrine carcinoma of lung [facility]) Staging form: Lung, AJCC 8th Edition - Clinical stage from 12/28/2016: Stage IVA (cT2a, cN0, cM1b) - Signed by Lloyd Huger, MD on 12/28/2016   Lloyd Huger, MD   10/31/2017 12:07 AM

## 2017-10-27 ENCOUNTER — Inpatient Hospital Stay: Payer: Medicare Other | Attending: Oncology

## 2017-10-27 ENCOUNTER — Inpatient Hospital Stay: Payer: Medicare Other

## 2017-10-27 ENCOUNTER — Inpatient Hospital Stay (HOSPITAL_BASED_OUTPATIENT_CLINIC_OR_DEPARTMENT_OTHER): Payer: Medicare Other | Admitting: Oncology

## 2017-10-27 ENCOUNTER — Encounter: Payer: Self-pay | Admitting: Oncology

## 2017-10-27 VITALS — BP 126/78 | HR 54 | Temp 97.0°F | Resp 16 | Wt 158.5 lb

## 2017-10-27 DIAGNOSIS — I4891 Unspecified atrial fibrillation: Secondary | ICD-10-CM | POA: Insufficient documentation

## 2017-10-27 DIAGNOSIS — N400 Enlarged prostate without lower urinary tract symptoms: Secondary | ICD-10-CM | POA: Diagnosis not present

## 2017-10-27 DIAGNOSIS — C7A8 Other malignant neuroendocrine tumors: Secondary | ICD-10-CM

## 2017-10-27 DIAGNOSIS — Z87891 Personal history of nicotine dependence: Secondary | ICD-10-CM | POA: Diagnosis not present

## 2017-10-27 DIAGNOSIS — M8580 Other specified disorders of bone density and structure, unspecified site: Secondary | ICD-10-CM | POA: Insufficient documentation

## 2017-10-27 DIAGNOSIS — Z79899 Other long term (current) drug therapy: Secondary | ICD-10-CM | POA: Insufficient documentation

## 2017-10-27 DIAGNOSIS — I10 Essential (primary) hypertension: Secondary | ICD-10-CM | POA: Diagnosis not present

## 2017-10-27 DIAGNOSIS — C7B02 Secondary carcinoid tumors of liver: Secondary | ICD-10-CM

## 2017-10-27 DIAGNOSIS — C7B8 Other secondary neuroendocrine tumors: Secondary | ICD-10-CM

## 2017-10-27 DIAGNOSIS — I4892 Unspecified atrial flutter: Secondary | ICD-10-CM | POA: Insufficient documentation

## 2017-10-27 DIAGNOSIS — E785 Hyperlipidemia, unspecified: Secondary | ICD-10-CM | POA: Insufficient documentation

## 2017-10-27 LAB — COMPREHENSIVE METABOLIC PANEL
ALBUMIN: 4.1 g/dL (ref 3.5–5.0)
ALK PHOS: 147 U/L — AB (ref 38–126)
ALT: 25 U/L (ref 17–63)
AST: 26 U/L (ref 15–41)
Anion gap: 7 (ref 5–15)
BUN: 19 mg/dL (ref 6–20)
CALCIUM: 9.7 mg/dL (ref 8.9–10.3)
CO2: 24 mmol/L (ref 22–32)
Chloride: 109 mmol/L (ref 101–111)
Creatinine, Ser: 0.93 mg/dL (ref 0.61–1.24)
GFR calc non Af Amer: >60 mL/min (ref >60)
GLUCOSE: 141 mg/dL — AB (ref 65–99)
POTASSIUM: 4.1 mmol/L (ref 3.5–5.1)
SODIUM: 140 mmol/L (ref 135–145)
Total Bilirubin: 0.9 mg/dL (ref 0.3–1.2)
Total Protein: 7 g/dL (ref 6.5–8.1)

## 2017-10-27 LAB — CBC WITH DIFFERENTIAL/PLATELET
BASOS ABS: 0.1 10*3/uL (ref 0–0.1)
BASOS PCT: 1 %
Eosinophils Absolute: 0.2 10*3/uL (ref 0–0.7)
Eosinophils Relative: 2 %
HCT: 43.3 % (ref 40.0–52.0)
Hemoglobin: 14.8 g/dL (ref 13.0–18.0)
LYMPHS PCT: 7 %
Lymphs Abs: 0.6 10*3/uL — ABNORMAL LOW (ref 1.0–3.6)
MCH: 30.7 pg (ref 26.0–34.0)
MCHC: 34.1 g/dL (ref 32.0–36.0)
MCV: 90 fL (ref 80.0–100.0)
MONOS PCT: 13 %
Monocytes Absolute: 1.2 10*3/uL — ABNORMAL HIGH (ref 0.2–1.0)
NEUTROS PCT: 77 %
Neutro Abs: 7 10*3/uL — ABNORMAL HIGH (ref 1.4–6.5)
PLATELETS: 166 10*3/uL (ref 150–440)
RBC: 4.81 MIL/uL (ref 4.40–5.90)
RDW: 14.4 % (ref 11.5–14.5)
WBC: 9.1 10*3/uL (ref 3.8–10.6)

## 2017-10-27 MED ORDER — LANREOTIDE ACETATE 120 MG/0.5ML ~~LOC~~ SOLN
120.0000 mg | Freq: Once | SUBCUTANEOUS | Status: AC
  Administered 2017-10-27: 120 mg via SUBCUTANEOUS
  Filled 2017-10-27: qty 120

## 2017-10-28 LAB — CHROMOGRANIN A: Chromogranin A: 4 nmol/L (ref 0–5)

## 2017-11-21 ENCOUNTER — Other Ambulatory Visit (HOSPITAL_COMMUNITY): Payer: Self-pay | Admitting: Interventional Radiology

## 2017-11-21 DIAGNOSIS — C3400 Malignant neoplasm of unspecified main bronchus: Secondary | ICD-10-CM

## 2017-11-21 DIAGNOSIS — C787 Secondary malignant neoplasm of liver and intrahepatic bile duct: Secondary | ICD-10-CM

## 2017-11-21 DIAGNOSIS — C7B8 Other secondary neuroendocrine tumors: Secondary | ICD-10-CM

## 2017-11-21 DIAGNOSIS — C7A8 Other malignant neuroendocrine tumors: Secondary | ICD-10-CM

## 2017-11-24 ENCOUNTER — Inpatient Hospital Stay: Payer: Medicare Other | Attending: Oncology

## 2017-11-24 ENCOUNTER — Inpatient Hospital Stay: Payer: Medicare Other

## 2017-11-24 DIAGNOSIS — M8580 Other specified disorders of bone density and structure, unspecified site: Secondary | ICD-10-CM | POA: Diagnosis not present

## 2017-11-24 DIAGNOSIS — Z79899 Other long term (current) drug therapy: Secondary | ICD-10-CM | POA: Diagnosis not present

## 2017-11-24 DIAGNOSIS — N400 Enlarged prostate without lower urinary tract symptoms: Secondary | ICD-10-CM | POA: Diagnosis not present

## 2017-11-24 DIAGNOSIS — I4892 Unspecified atrial flutter: Secondary | ICD-10-CM | POA: Diagnosis not present

## 2017-11-24 DIAGNOSIS — Z87891 Personal history of nicotine dependence: Secondary | ICD-10-CM | POA: Insufficient documentation

## 2017-11-24 DIAGNOSIS — I10 Essential (primary) hypertension: Secondary | ICD-10-CM | POA: Insufficient documentation

## 2017-11-24 DIAGNOSIS — C7B8 Other secondary neuroendocrine tumors: Secondary | ICD-10-CM

## 2017-11-24 DIAGNOSIS — E785 Hyperlipidemia, unspecified: Secondary | ICD-10-CM | POA: Insufficient documentation

## 2017-11-24 DIAGNOSIS — C7B02 Secondary carcinoid tumors of liver: Secondary | ICD-10-CM | POA: Diagnosis not present

## 2017-11-24 DIAGNOSIS — I4891 Unspecified atrial fibrillation: Secondary | ICD-10-CM | POA: Insufficient documentation

## 2017-11-24 DIAGNOSIS — C7A8 Other malignant neuroendocrine tumors: Secondary | ICD-10-CM

## 2017-11-24 LAB — CBC WITH DIFFERENTIAL/PLATELET
Basophils Absolute: 0.1 10*3/uL (ref 0–0.1)
Basophils Relative: 1 %
EOS PCT: 2 %
Eosinophils Absolute: 0.2 10*3/uL (ref 0–0.7)
HEMATOCRIT: 43.9 % (ref 40.0–52.0)
HEMOGLOBIN: 14.6 g/dL (ref 13.0–18.0)
LYMPHS ABS: 0.7 10*3/uL — AB (ref 1.0–3.6)
Lymphocytes Relative: 9 %
MCH: 30.1 pg (ref 26.0–34.0)
MCHC: 33.3 g/dL (ref 32.0–36.0)
MCV: 90.3 fL (ref 80.0–100.0)
MONO ABS: 1 10*3/uL (ref 0.2–1.0)
MONOS PCT: 11 %
NEUTROS ABS: 6.8 10*3/uL — AB (ref 1.4–6.5)
Neutrophils Relative %: 77 %
Platelets: 159 10*3/uL (ref 150–440)
RBC: 4.86 MIL/uL (ref 4.40–5.90)
RDW: 14.1 % (ref 11.5–14.5)
WBC: 8.8 10*3/uL (ref 3.8–10.6)

## 2017-11-24 LAB — COMPREHENSIVE METABOLIC PANEL
ALBUMIN: 4 g/dL (ref 3.5–5.0)
ALK PHOS: 123 U/L (ref 38–126)
ALT: 19 U/L (ref 0–44)
ANION GAP: 7 (ref 5–15)
AST: 24 U/L (ref 15–41)
BUN: 17 mg/dL (ref 8–23)
CALCIUM: 9.5 mg/dL (ref 8.9–10.3)
CO2: 24 mmol/L (ref 22–32)
CREATININE: 1.05 mg/dL (ref 0.61–1.24)
Chloride: 109 mmol/L (ref 98–111)
GFR calc Af Amer: >60 mL/min (ref >60)
GFR calc non Af Amer: >60 mL/min (ref >60)
GLUCOSE: 131 mg/dL — AB (ref 70–99)
Potassium: 4.3 mmol/L (ref 3.5–5.1)
SODIUM: 140 mmol/L (ref 135–145)
Total Bilirubin: 1 mg/dL (ref 0.3–1.2)
Total Protein: 6.7 g/dL (ref 6.5–8.1)

## 2017-11-24 MED ORDER — LANREOTIDE ACETATE 120 MG/0.5ML ~~LOC~~ SOLN
120.0000 mg | Freq: Once | SUBCUTANEOUS | Status: AC
  Administered 2017-11-24: 120 mg via SUBCUTANEOUS
  Filled 2017-11-24: qty 120

## 2017-11-25 LAB — CHROMOGRANIN A: Chromogranin A: 4 nmol/L (ref 0–5)

## 2017-12-08 DIAGNOSIS — Z872 Personal history of diseases of the skin and subcutaneous tissue: Secondary | ICD-10-CM | POA: Diagnosis not present

## 2017-12-08 DIAGNOSIS — Z1283 Encounter for screening for malignant neoplasm of skin: Secondary | ICD-10-CM | POA: Diagnosis not present

## 2017-12-08 DIAGNOSIS — L570 Actinic keratosis: Secondary | ICD-10-CM | POA: Diagnosis not present

## 2017-12-08 DIAGNOSIS — L578 Other skin changes due to chronic exposure to nonionizing radiation: Secondary | ICD-10-CM | POA: Diagnosis not present

## 2017-12-12 ENCOUNTER — Encounter (HOSPITAL_COMMUNITY)
Admission: RE | Admit: 2017-12-12 | Discharge: 2017-12-12 | Disposition: A | Discharge status: Home / Self Care | Payer: Medicare Other | Source: Ambulatory Visit | Attending: Interventional Radiology | Admitting: Interventional Radiology

## 2017-12-12 DIAGNOSIS — C7A1 Malignant poorly differentiated neuroendocrine tumors: Secondary | ICD-10-CM | POA: Diagnosis not present

## 2017-12-12 DIAGNOSIS — C3400 Malignant neoplasm of unspecified main bronchus: Secondary | ICD-10-CM | POA: Insufficient documentation

## 2017-12-12 DIAGNOSIS — C787 Secondary malignant neoplasm of liver and intrahepatic bile duct: Secondary | ICD-10-CM | POA: Insufficient documentation

## 2017-12-12 DIAGNOSIS — C7B8 Other secondary neuroendocrine tumors: Secondary | ICD-10-CM | POA: Insufficient documentation

## 2017-12-12 DIAGNOSIS — C7A8 Other malignant neuroendocrine tumors: Secondary | ICD-10-CM | POA: Insufficient documentation

## 2017-12-12 MED ORDER — GALLIUM GA 68 DOTATATE IV KIT
3.7100 | PACK | Freq: Once | INTRAVENOUS | Status: AC
  Administered 2017-12-12: 3.71 via INTRAVENOUS

## 2017-12-18 ENCOUNTER — Other Ambulatory Visit: Payer: Self-pay | Admitting: Cardiovascular Disease

## 2017-12-19 NOTE — Progress Notes (Signed)
[place]  [phone] [phone]) [phone]  ID: Danne Harbor OB: 10/09/1940  MR#: [id number]  TDD#:[id number]  Patient Care Team: Arnetha Courser, MD as PCP - General (Family Medicine) [name], Forest Gleason, MD (General Surgery) Nestor Lewandowsky, MD as Referring Physician (Cardiothoracic Surgery) Minna Merritts, MD as Consulting Physician (Cardiology) [name], MD as Consulting Physician (Cardiothoracic Surgery) Lloyd Huger, MD as Consulting Physician (Oncology) Sanda Klein Satira Anis, MD as Attending Physician (Family Medicine)  CHIEF COMPLAINT: 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  INTERVAL HISTORY: Patient returns to clinic today for further evaluation and continuation of lanreotide.  He continues to feel well and remains asymptomatic.  He has no neurologic complaints.  He denies any weakness or fatigue.  He has good appetite and denies weight loss.  He has no chest pain or shortness of breath.  He denies any abdominal pain.  He has no nausea, vomiting, constipation, or diarrhea.  He has no urinary complaints.  Patient feels at his baseline offers no specific complaints today.  REVIEW OF SYSTEMS:   Review of Systems  Constitutional: Negative.  Negative for fever, malaise/fatigue and weight loss.  Respiratory: Negative.  Negative for cough, hemoptysis and shortness of breath.   Cardiovascular: Negative.  Negative for chest pain and leg swelling.  Gastrointestinal: Negative.  Negative for abdominal pain, diarrhea, nausea and vomiting.  Genitourinary: Negative.  Negative for dysuria.  Musculoskeletal: Negative.  Negative for back pain.  Skin: Negative.  Negative for rash.  Neurological: Negative.  Negative for dizziness, sensory change, focal weakness and weakness.  Psychiatric/Behavioral: Negative.  The patient is not nervous/anxious.     As per HPI. Otherwise, a complete review of systems is negative.  PAST  MEDICAL HISTORY: Past Medical History:  Diagnosis Date  . Allergic rhinitis   . Benign prostatic hypertrophy with lower urinary tract symptoms (LUTS)   . Chest pain    a. 05/2013 MV: EF 70%, no ischemia.  . Diverticulitis    DIVERTICULOSIS  . Enlarged prostate   . Essential hypertension    CONTROLLED ON MEDS  . Full dentures    upper and lower  . H/O hypokalemia   . Hx of atrial fibrillation without current medication    CARDIOLOGIST-DR [facility]  . 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 Hyperlipemia   . Hypomagnesemia   . IFG (impaired fasting glucose)   . Liver cancer ([place])   . Liver cancer ([place]) 12/08/2016  . Liver mass, left lobe 08/28/2016   Probably hemangioma; will get MRI of liver, refer to GI  . Lung cancer ([place])    a. carcinoid, left lung, Stage 1b (T2a, N0, cM0);  b. 05/2013 s/p VATS & LULobectomy.  . Monocytosis   . Osteopenia   . Paroxysmal atrial flutter ([facility])    a. 03/2013->no recurrence;  b. CHA2DS2VASc = 2-->not currently on anticoagulation;  c. 05/2012 Echo: EF 55-60%, normal RV.  [name] Wears glasses     PAST SURGICAL HISTORY: Past Surgical History:  Procedure Laterality Date  . BRONCHOSCOPY     04/06/2013  . CARDIOVASCULAR STRESS TEST  05/2013   a. No evidence of ischemia or infarct, EF 70%, no WMAs  . CATARACT EXTRACTION W/PHACO Left 07/14/2015   Procedure: CATARACT EXTRACTION PHACO AND INTRAOCULAR LENS PLACEMENT (IOC);  Surgeon: [name], MD;  Location: [place];  Service: Ophthalmology;  Laterality: Left;  . CATARACT EXTRACTION W/PHACO Right 10/01/2015  Procedure: CATARACT EXTRACTION PHACO AND INTRAOCULAR LENS PLACEMENT (IOC);  Surgeon: [name], MD;  Location: [place];  Service: Ophthalmology;  Laterality: Right;  . COLONOSCOPY W/ POLYPECTOMY     bleed after-had to go to surgery to stop bleeding via colonoscopy  . COLONOSCOPY WITH PROPOFOL N/A 11/19/2015   Procedure: COLONOSCOPY WITH  PROPOFOL;  Surgeon: [name], MD;  Location[facility] ENDOSCOPY;  Service: Endoscopy;  Laterality: N/A;  . DUPUYTREN CONTRACTURE RELEASE  12/15/2011   Procedure: DUPUYTREN CONTRACTURE RELEASE;  Surgeon: [name], MD;  Location: [place];  Service: Orthopedics;  Laterality: Left;  Fasciotomy left ring finger dupuytrens  . IR ANGIOGRAM SELECTIVE EACH ADDITIONAL VESSEL  07/26/2017  . IR ANGIOGRAM SELECTIVE EACH ADDITIONAL VESSEL  07/26/2017  . IR ANGIOGRAM SELECTIVE EACH ADDITIONAL VESSEL  07/26/2017  . IR ANGIOGRAM SELECTIVE EACH ADDITIONAL VESSEL  07/26/2017  . IR ANGIOGRAM SELECTIVE EACH ADDITIONAL VESSEL  07/26/2017  . IR ANGIOGRAM SELECTIVE EACH ADDITIONAL VESSEL  08/11/2017  . IR ANGIOGRAM VISCERAL SELECTIVE  07/26/2017  . IR ANGIOGRAM VISCERAL SELECTIVE  08/11/2017  . IR EMBO ARTERIAL NOT HEMORR HEMANG INC GUIDE ROADMAPPING  07/26/2017  . IR EMBO TUMOR ORGAN ISCHEMIA INFARCT INC GUIDE ROADMAPPING  08/11/2017  . IR RADIOLOGIST EVAL & MGMT  07/07/2017  . IR RADIOLOGIST EVAL & MGMT  09/07/2017  . IR RADIOLOGIST EVAL & MGMT  12/21/2017  . IR US GUIDE VASC ACCESS RIGHT  07/26/2017  . IR US GUIDE VASC ACCESS RIGHT  08/11/2017  . LUNG LOBECTOMY  06/10/13   upper left lung  . MULTIPLE TOOTH EXTRACTIONS    . REMOVAL RETAINED LENS Right 11/17/2015   Procedure: REMOVAL RETAINED LENS FROAGMENTS RIGHT EYE;  Surgeon: [name], MD;  Location: [place];  Service: Ophthalmology;  Laterality: Right;  . TONSILLECTOMY    . UPPER GASTROINTESTINAL ENDOSCOPY  11-03-15   Dr [name]  . VASECTOMY    . VIDEO ASSISTED THORACOSCOPY (VATS)/WEDGE RESECTION Left 06/04/2013   Procedure: VIDEO ASSISTED THORACOSCOPY (VATS)/WEDGE RESECTION;  Surgeon: [name], MD;  Location: [place];  Service: Thoracic;  Laterality: Left;  [name] VIDEO BRONCHOSCOPY N/A 06/04/2013   Procedure: VIDEO BRONCHOSCOPY;  Surgeon: [name], MD;  Location: [facility] OR;  Service: Thoracic;  Laterality: N/A;    FAMILY  HISTORY: Family History  Problem Relation Age of Onset  . Stroke Mother   . Alzheimer's disease Mother   . Hypertension Sister   . Hyperlipidemia Sister   . Hypertension Brother   . Hyperlipidemia Brother   . Diabetes Brother   . Cancer Sister        lung and colon    ADVANCED DIRECTIVES (Y/N):  N  HEALTH MAINTENANCE: Social History   Tobacco Use  . Smoking status: Former Smoker    Packs/day: 1.00    Years: 40.00    Pack years: 40.00    Types: Cigarettes    Last attempt to quit: 12/09/1998    Years since quitting: 19.0  . Smokeless tobacco: Never Used  Substance Use Topics  . Alcohol use: No  . Drug use: No     Colonoscopy:  PAP:  Bone density:  Lipid panel:  Allergies  Allergen Reactions  . Latex Itching    Tape only- REACTED TO BANDAGE ON ABDOMEN  . Tape Itching    Surgical tapes     Current Outpatient Medications  Medication Sig Dispense Refill  . amLODipine (NORVASC) 2.5 MG tablet TAKE ONE TABLET BY MOUTH  ONCE DAILY 90 tablet 2  . atorvastatin (LIPITOR) 40 MG tablet TAKE 1 TABLET BY MOUTH AT BEDTIME FOR CHOLESTEROL 90 tablet 1  . b complex vitamins capsule Take 1 capsule by mouth daily. AM    . Calcium Carbonate-Vitamin D3 (CALCIUM 600/VITAMIN D) 600-400 MG-UNIT TABS Take by mouth daily.    . carvedilol (COREG) 3.125 MG tablet TAKE ONE TABLET BY MOUTH ONCE DAILY IN AM 90 tablet 3  . Cinnamon 500 MG capsule Take 500 mg by mouth 2 (two) times daily. AM AND BEDTIME    . fexofenadine (ALLEGRA) 180 MG tablet Take 180 mg by mouth daily.    . finasteride (PROSCAR) 5 MG tablet TAKE 1 TABLET BY MOUTH ONCE DAILY 90 tablet 3  . losartan (COZAAR) 100 MG tablet TAKE 1 TABLET BY MOUTH ONCE DAILY 90 tablet 3  . Multiple Vitamins-Minerals (MULTIVITAMIN WITH MINERALS) tablet Take 1 tablet by mouth daily. AM    . polycarbophil (FIBERCON) 625 MG tablet Take 625 mg by mouth daily. AM    . polyethylene glycol (MIRALAX / GLYCOLAX) packet Take 17 g by mouth daily as needed.       . tamsulosin (FLOMAX) 0.4 MG CAPS capsule TAKE ONE CAPSULE BY MOUTH ONCE DAILY 90 capsule 3  . vitamin C (ASCORBIC ACID) 500 MG tablet Take 500 mg by mouth daily. AM     No current facility-administered medications for this visit.     OBJECTIVE: Vitals:   12/22/17 1036 12/22/17 1039  BP:  116/73  Pulse:  (!) 51  Resp: 16   Temp:  97.8 F (36.6 C)     Body mass index is 27.03 kg/m.    ECOG FS:0 - Asymptomatic  General: Well-developed, well-nourished, no acute distress. Eyes: Pink conjunctiva, anicteric sclera. HEENT: Normocephalic, moist mucous membranes. Lungs: Clear to auscultation bilaterally. Heart: Regular rate and rhythm. No rubs, murmurs, or gallops. Abdomen: Soft, nontender, nondistended. No organomegaly noted, normoactive bowel sounds. Musculoskeletal: No edema, cyanosis, or clubbing. Neuro: Alert, answering all questions appropriately. Cranial nerves grossly intact. Skin: No rashes or petechiae noted. Psych: Normal affect.  LAB RESULTS:  Lab Results  Component Value Date   NA 139 12/22/2017   K 4.3 12/22/2017   CL 107 12/22/2017   CO2 24 12/22/2017   GLUCOSE 130 (H) 12/22/2017   BUN 18 12/22/2017   CREATININE 0.86 12/22/2017   CALCIUM 9.5 12/22/2017   PROT 7.0 12/22/2017   ALBUMIN 4.2 12/22/2017   AST 20 12/22/2017   ALT 19 12/22/2017   ALKPHOS 119 12/22/2017   BILITOT 0.8 12/22/2017   GFRNONAA >60 12/22/2017   GFRAA >60 12/22/2017    Lab Results  Component Value Date   WBC 8.8 12/22/2017   NEUTROABS 6.5 12/22/2017   HGB 14.8 12/22/2017   HCT 43.9 12/22/2017   MCV 90.0 12/22/2017   PLT 173 12/22/2017     STUDIES: Nm Pet (netspot Ga 75 Dotatate) Skull Base To Mid Thigh  Result Date: 12/13/2017 CLINICAL DATA:  Neuroendocrine tumor with metastatic disease to the liver. LEFT upper lobe primary post resection. Prior yttrium 90 radio therapy on 08/11/2017 EXAM: NUCLEAR MEDICINE PET SKULL BASE TO THIGH TECHNIQUE: 3.7 mCi Ga 68 DOTATATE was injected  intravenously. Full-ring PET imaging was performed from the skull base to thigh after the radiotracer. CT data was obtained and used for attenuation correction and anatomic localization. COMPARISON:  MRI 06/27/2017 FINDINGS: NECK No radiotracer activity in neck lymph nodes. Incidental CT findings: None CHEST No radiotracer accumulation within mediastinal  or hilar lymph nodes. No suspicious pulmonary nodules on the CT scan. Incidental CT finding:LEFT upper lobe resection. No suspicious nodularity. ABDOMEN/PELVIS Mild radiotracer accumulation within the treated LEFT hepatic lobe lesion which measures approximately 2.9 by 2.1 cm in the LEFT hepatic lobe with SUV max equal 7.6 this compares to 4.3 by 3.4 cm on comparison MRI (06/27/2017. (Of note these are differing techniques Two small lesions in the dome of the RIGHT hepatic lobe SUV max equal 5.6 (image 89 of fused data set) do not have corresponding findings on noncontrast CT. Similar small lesion in the inferior medial RIGHT hepatic lobe (image 107 fused data set). There was a small T2 hyperintense lesion on comparison MRI in this region (06/27/2017). No focal activity within upper abdominal lymph nodes or pelvic lymph nodes. No hypermetabolic activity associated with the bowel. Physiologic activity noted within the uncinate of the pancreas, adrenal glands, kidneys and spleen. Physiologic activity noted in the liver, spleen, adrenal glands and kidneys. Incidental CT findings:None SKELETON Endplate uptake associated with the inferior endplates of the T6 and T7 vertebral bodies is favored degenerative in nature. Incidental CT findings:None IMPRESSION: 1. Persistent radiotracer activity within the LEFT hepatic lobe lesion which measures smaller. Activity is mild to moderate for well differentiated neuroendocrine tumor likely related positive treatment effect of yttrium 90 radio therapy. 2. Several small lesions in the RIGHT hepatic lobe accumulate radiotracer but are  indeterminate. This low radiotracer activity would be atypical for untreated well differentiated unless very small. Recommend attention on follow-up. Consider follow-up MRI with contrast or contrast CT liver protocol. 3. No evidence of metastatic neuroendocrine tumor outside the liver. Electronically Signed   By: Suzy Bouchard M.D.   On: 12/13/2017 09:46   Ir Radiologist Eval & Mgmt  Result Date: 12/21/2017 Please refer to notes tab for details about interventional procedure. 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  PLAN:    1. 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 Patient underwent left upper lobe lobectomy in [place] on June 04, 2013. Final pathology results revealed a well differentiated neuroendocrine tumor with positive bronchial margins. Patient did not receive adjuvant XRT or chemotherapy at that time.  MRI results from June 27, 2017 reviewed independently with progression of patient's known disease in his liver.  His chromogranin A levels are also trending up slightly.  Patient had his most recent Y-90 injection on August 11, 2017.  Repeat imaging with Dotatate PET scan on December 12, 2017 reviewed independently and report as above with improvement of patient's disease burden.  Appreciate interventional radiology input.  Proceed with 120 mg subcutaneous lanreotide today.  Return to clinic in 4 and 8 weeks for lanreotide only and then in 12 weeks for further evaluation and continuation of treatment.  Patient's next PET scan will be scheduled in 3 months by interventional radiology.    2.  Diarrhea: Patient does not complain of this today.  I spent a total of 30 minutes face-to-face with the patient of which greater than 50% of the visit was spent in counseling and coordination of care as detailed above.  Patient expressed  understanding and was in agreement with this plan. He also understands that He can call clinic at any time with any questions, concerns, or complaints.   Cancer Staging Neuroendocrine carcinoma of lung ([place]) Staging form: Lung, AJCC 8th Edition - Clinical stage from  12/28/2016: Stage IVA (cT2a, cN0, BW6O) - Signed by Lloyd Huger, MD on 12/28/2016   Lloyd Huger, MD   12/26/2017 12:50 PM

## 2017-12-21 ENCOUNTER — Ambulatory Visit
Admission: RE | Admit: 2017-12-21 | Discharge: 2017-12-21 | Disposition: A | Discharge status: Home / Self Care | Payer: Medicare Other | Source: Ambulatory Visit | Attending: Interventional Radiology | Admitting: Interventional Radiology

## 2017-12-21 ENCOUNTER — Encounter: Payer: Self-pay | Admitting: Radiology

## 2017-12-21 DIAGNOSIS — C3400 Malignant neoplasm of unspecified main bronchus: Secondary | ICD-10-CM

## 2017-12-21 DIAGNOSIS — C7B02 Secondary carcinoid tumors of liver: Secondary | ICD-10-CM | POA: Diagnosis not present

## 2017-12-21 DIAGNOSIS — Z9889 Other specified postprocedural states: Secondary | ICD-10-CM | POA: Diagnosis not present

## 2017-12-21 DIAGNOSIS — C787 Secondary malignant neoplasm of liver and intrahepatic bile duct: Secondary | ICD-10-CM

## 2017-12-21 HISTORY — PX: IR RADIOLOGIST EVAL & MGMT: IMG5224

## 2017-12-21 NOTE — Progress Notes (Signed)
Patient ID: Jason Malone, male   DOB: [birth year], 77 y.o.   MRN: [id number]       Chief Complaint:  89-month status post left hepatic Y 90 radio embolization  Referring Physician(s): Jason Malone  History of Present Illness: Jason Malone is a 77 y.o. male with stage IVa metastatic neuroendocrine tumor to the liver.  He returns for an outpatient visit 57-month status post left hepatic Y 90 radio embolization.  Most recent imaging is a dotatate PET CT 12/12/2017.  Overall he is feeling very well and remains asymptomatic.  No significant abdominal pain, weakness or fatigue.  No complaints of diarrhea or flushing syndrome.  No interval fevers or illness.  Stable appetite and weight area no current chest pain, shortness of breath, abdominal pain or cough.  No interval nausea, vomiting or constipation.  Overall he is at his baseline.  Past Medical History:  Diagnosis Date  . Allergic rhinitis   . Benign prostatic hypertrophy with lower urinary tract symptoms (LUTS)   . Chest pain    a. 05/2013 MV: EF 70%, no ischemia.  . Diverticulitis    DIVERTICULOSIS  . Enlarged prostate   . Essential hypertension    CONTROLLED ON MEDS  . Full dentures    upper and lower  . H/O hypokalemia   . Hx of atrial fibrillation without current medication    CARDIOLOGIST-DR Jason Malone  . 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 Hyperlipemia   . Hypomagnesemia   . IFG (impaired fasting glucose)   . Liver cancer (Jason Malone)   . Liver cancer (Jason Malone) 12/08/2016  . Liver mass, left lobe 08/28/2016   Probably hemangioma; will get MRI of liver, refer to GI  . Lung cancer ([place])    a. carcinoid, left lung, Stage 1b (T2a, N0, cM0);  b. 05/2013 s/p VATS & LULobectomy.  . Monocytosis   . Osteopenia   . Paroxysmal atrial flutter ([facility])    a. 03/2013->no recurrence;  b. CHA2DS2VASc = 2-->not currently on anticoagulation;  c. 05/2012 Echo: EF 55-60%, normal RV.  [name] Wears glasses     Past Surgical History:   Procedure Laterality Date  . BRONCHOSCOPY     04/06/2013  . CARDIOVASCULAR STRESS TEST  05/2013   a. No evidence of ischemia or infarct, EF 70%, no WMAs  . CATARACT EXTRACTION W/PHACO Left 07/14/2015   Procedure: CATARACT EXTRACTION PHACO AND INTRAOCULAR LENS PLACEMENT (IOC);  Surgeon: [name], MD;  Location: [facility];  Service: Ophthalmology;  Laterality: Left;  . CATARACT EXTRACTION W/PHACO Right 10/01/2015   Procedure: CATARACT EXTRACTION PHACO AND INTRAOCULAR LENS PLACEMENT (IOC);  Surgeon: [name], MD;  Location: [facility];  Service: Ophthalmology;  Laterality: Right;  . COLONOSCOPY W/ POLYPECTOMY     bleed after-had to go to surgery to stop bleeding via colonoscopy  . COLONOSCOPY WITH PROPOFOL N/A 11/19/2015   Procedure: COLONOSCOPY WITH PROPOFOL;  Surgeon: [name], MD;  Location: [facility] ENDOSCOPY;  Service: Endoscopy;  Laterality: N/A;  . DUPUYTREN CONTRACTURE RELEASE  12/15/2011   Procedure: DUPUYTREN CONTRACTURE RELEASE;  Surgeon: [name], MD;  Location: [place];  Service: Orthopedics;  Laterality: Left;  Fasciotomy left ring finger dupuytrens  . IR ANGIOGRAM SELECTIVE EACH ADDITIONAL VESSEL  07/26/2017  . IR ANGIOGRAM SELECTIVE EACH ADDITIONAL VESSEL  07/26/2017  . IR ANGIOGRAM SELECTIVE EACH ADDITIONAL VESSEL  07/26/2017  . IR ANGIOGRAM SELECTIVE EACH ADDITIONAL VESSEL  07/26/2017  . IR ANGIOGRAM SELECTIVE  EACH ADDITIONAL VESSEL  07/26/2017  . IR ANGIOGRAM SELECTIVE EACH ADDITIONAL VESSEL  08/11/2017  . IR ANGIOGRAM VISCERAL SELECTIVE  07/26/2017  . IR ANGIOGRAM VISCERAL SELECTIVE  08/11/2017  . IR EMBO ARTERIAL NOT HEMORR HEMANG INC GUIDE ROADMAPPING  07/26/2017  . IR EMBO TUMOR ORGAN ISCHEMIA INFARCT INC GUIDE ROADMAPPING  08/11/2017  . IR RADIOLOGIST EVAL & MGMT  07/07/2017  . IR RADIOLOGIST EVAL & MGMT  09/07/2017  . IR RADIOLOGIST EVAL & MGMT  12/21/2017  . IR US GUIDE VASC ACCESS RIGHT  07/26/2017  . IR US GUIDE VASC  ACCESS RIGHT  08/11/2017  . LUNG LOBECTOMY  06/10/13   upper left lung  . MULTIPLE TOOTH EXTRACTIONS    . REMOVAL RETAINED LENS Right 11/17/2015   Procedure: REMOVAL RETAINED LENS FROAGMENTS RIGHT EYE;  Surgeon: [name], MD;  Location: [place];  Service: Ophthalmology;  Laterality: Right;  . TONSILLECTOMY    . UPPER GASTROINTESTINAL ENDOSCOPY  11-03-15   Dr [name]  . VASECTOMY    . VIDEO ASSISTED THORACOSCOPY (VATS)/WEDGE RESECTION Left 06/04/2013   Procedure: VIDEO ASSISTED THORACOSCOPY (VATS)/WEDGE RESECTION;  Surgeon: [name], MD;  Location: [place];  Service: Thoracic;  Laterality: Left;  [name] VIDEO BRONCHOSCOPY N/A 06/04/2013   Procedure: VIDEO BRONCHOSCOPY;  Surgeon: [name], MD;  [facility];  Service: Thoracic;  Laterality: N/A;    Allergies: Latex and Tape  Medications: Prior to Admission medications   Medication Sig Start Date End Date Taking? Authorizing Provider  amLODipine (NORVASC) 2.5 MG tablet TAKE ONE TABLET BY MOUTH ONCE DAILY 04/11/17  Yes Gollan, Kathlene November, MD  atorvastatin (LIPITOR) 40 MG tablet TAKE 1 TABLET BY MOUTH AT BEDTIME FOR CHOLESTEROL 08/16/17  Yes Lada, Satira Anis, MD  b complex vitamins capsule Take 1 capsule by mouth daily. AM   Yes [provider]  Calcium Carbonate-Vitamin D3 (CALCIUM 600/VITAMIN D) 600-400 MG-UNIT TABS Take by mouth daily.   Yes [provider]  carvedilol (COREG) 3.125 MG tablet TAKE ONE TABLET BY MOUTH ONCE DAILY IN AM 02/28/17  Yes Gollan, Kathlene November, MD  Cinnamon 500 MG capsule Take 500 mg by mouth 2 (two) times daily. AM AND BEDTIME   Yes [provider]  fexofenadine (ALLEGRA) 180 MG tablet Take 180 mg by mouth daily.   Yes [provider]  finasteride (PROSCAR) 5 MG tablet TAKE 1 TABLET BY MOUTH ONCE DAILY 10/11/17  Yes Lada, Satira Anis, MD  losartan (COZAAR) 100 MG tablet TAKE 1 TABLET BY MOUTH ONCE DAILY 12/19/17  Yes Gollan, Kathlene November, MD  Multiple  Vitamins-Minerals (MULTIVITAMIN WITH MINERALS) tablet Take 1 tablet by mouth daily. AM   Yes [provider]  polycarbophil (FIBERCON) 625 MG tablet Take 625 mg by mouth daily. AM   Yes [provider]  polyethylene glycol (MIRALAX / GLYCOLAX) packet Take 17 g by mouth daily as needed.    Yes [provider]  tamsulosin (FLOMAX) 0.4 MG CAPS capsule TAKE ONE CAPSULE BY MOUTH ONCE DAILY 04/26/17  Yes Lada, Satira Anis, MD  vitamin C (ASCORBIC ACID) 500 MG tablet Take 500 mg by mouth daily. AM   Yes [provider]     Family History  Problem Relation Age of Onset  . Stroke Mother   . Alzheimer's disease Mother   . Hypertension Sister   . Hyperlipidemia Sister   . Hypertension Brother   . Hyperlipidemia Brother   . Diabetes Brother   . Cancer Sister  lung and colon    Social History   Socioeconomic History  . Marital status: Married    Spouse name: Not on file  . Number of children: Not on file  . Years of education: Not on file  . Highest education level: Not on file  Occupational History  . Not on file  Social Needs  . Financial resource strain: Not on file  . Food insecurity:    Worry: Not on file    Inability: Not on file  . Transportation needs:    Medical: Not on file    Non-medical: Not on file  Tobacco Use  . Smoking status: Former Smoker    Packs/day: 1.00    Years: 40.00    Pack years: 40.00    Types: Cigarettes    Last attempt to quit: 12/09/1998    Years since quitting: 19.0  . Smokeless tobacco: Never Used  Substance and Sexual Activity  . Alcohol use: No  . Drug use: No  . Sexual activity: Yes  Lifestyle  . Physical activity:    Days per week: Not on file    Minutes per session: Not on file  . Stress: Not on file  Relationships  . Social connections:    Talks on phone: Not on file    Gets together: Not on file    Attends religious service: Not on file    Active member of club or organization: Not on file     Attends meetings of clubs or organizations: Not on file    Relationship status: Not on file  Other Topics Concern  . Not on file  Social History Narrative  . Not on file    ECOG Status: 1 - Symptomatic but completely ambulatory  Review of Systems: A 12 point ROS discussed and pertinent positives are indicated in the HPI above.  All other systems are negative.  Review of Systems  Vital Signs: BP 129/73   Pulse (!) 53   Temp 98.3 F (36.8 C) (Oral)   Resp 15   Ht 5\' 3"  (1.6 m)   Wt 69.4 kg   SpO2 97%   BMI 27.10 kg/m   Physical Exam  Constitutional: He is oriented to person, place, and time. He appears well-developed and well-nourished. No distress.  Eyes: Conjunctivae are normal. No scleral icterus.  Cardiovascular: Normal rate and regular rhythm.  No murmur heard. Pulmonary/Chest: Effort normal and breath sounds normal. No respiratory distress. He has no wheezes.  Abdominal: Soft. Bowel sounds are normal. He exhibits no distension and no mass. There is no tenderness.  Musculoskeletal: He exhibits no edema or tenderness.  Neurological: He is alert and oriented to person, place, and time.  Skin: Skin is warm and dry. No rash noted. He is not diaphoretic.  Psychiatric: He has a normal mood and affect. His behavior is normal.    Imaging: Nm Pet (netspot Ga 68 Dotatate) Skull Base To Mid Thigh  Result Date: 12/13/2017 CLINICAL DATA:  Neuroendocrine tumor with metastatic disease to the liver. LEFT upper lobe primary post resection. Prior yttrium 90 radio therapy on 08/11/2017 EXAM: NUCLEAR MEDICINE PET SKULL BASE TO THIGH TECHNIQUE: 3.7 mCi Ga 68 DOTATATE was injected intravenously. Full-ring PET imaging was performed from the skull base to thigh after the radiotracer. CT data was obtained and used for attenuation correction and anatomic localization. COMPARISON:  MRI 06/27/2017 FINDINGS: NECK No radiotracer activity in neck lymph nodes. Incidental CT findings: None CHEST No  radiotracer accumulation within mediastinal or  hilar lymph nodes. No suspicious pulmonary nodules on the CT scan. Incidental CT finding:LEFT upper lobe resection. No suspicious nodularity. ABDOMEN/PELVIS Mild radiotracer accumulation within the treated LEFT hepatic lobe lesion which measures approximately 2.9 by 2.1 cm in the LEFT hepatic lobe with SUV max equal 7.6 this compares to 4.3 by 3.4 cm on comparison MRI (06/27/2017. (Of note these are differing techniques Two small lesions in the dome of the RIGHT hepatic lobe SUV max equal 5.6 (image 89 of fused data set) do not have corresponding findings on noncontrast CT. Similar small lesion in the inferior medial RIGHT hepatic lobe (image 107 fused data set). There was a small T2 hyperintense lesion on comparison MRI in this region (06/27/2017). No focal activity within upper abdominal lymph nodes or pelvic lymph nodes. No hypermetabolic activity associated with the bowel. Physiologic activity noted within the uncinate of the pancreas, adrenal glands, kidneys and spleen. Physiologic activity noted in the liver, spleen, adrenal glands and kidneys. Incidental CT findings:None SKELETON Endplate uptake associated with the inferior endplates of the T6 and T7 vertebral bodies is favored degenerative in nature. Incidental CT findings:None IMPRESSION: 1. Persistent radiotracer activity within the LEFT hepatic lobe lesion which measures smaller. Activity is mild to moderate for well differentiated neuroendocrine tumor likely related positive treatment effect of yttrium 90 radio therapy. 2. Several small lesions in the RIGHT hepatic lobe accumulate radiotracer but are indeterminate. This low radiotracer activity would be atypical for untreated well differentiated unless very small. Recommend attention on follow-up. Consider follow-up MRI with contrast or contrast CT liver protocol. 3. No evidence of metastatic neuroendocrine tumor outside the liver. Electronically Signed    By: Suzy Bouchard M.D.   On: 12/13/2017 09:46   Ir Radiologist Eval & Mgmt  Result Date: 12/21/2017 Please refer to notes tab for details about interventional procedure. (Op Note)   Labs:  CBC: Recent Labs    08/29/17 1021 09/28/17 0950 10/27/17 1000 11/24/17 1036  WBC 10.3 8.0 9.1 8.8  HGB 14.0 14.7 14.8 14.6  HCT 40.6 42.5 43.3 43.9  PLT 190 157 166 159    COAGS: Recent Labs    07/26/17 0748 08/11/17 0738  INR 1.02 0.94    BMP: Recent Labs    08/29/17 1021 09/28/17 0950 10/27/17 1000 11/24/17 1036  NA 139 140 140 140  K 4.7 4.7 4.1 4.3  CL 104 106 109 109  CO2 26 26 24 24   GLUCOSE 130* 142* 141* 131*  BUN 17 19 19 17   CALCIUM 9.4 9.8 9.7 9.5  CREATININE 1.05 0.98 0.93 1.05  GFRNONAA >60 >60 >60 >60  GFRAA >60 >60 >60 >60    LIVER FUNCTION TESTS: Recent Labs    08/29/17 1021 09/28/17 0950 10/27/17 1000 11/24/17 1036  BILITOT 0.5 0.7 0.9 1.0  AST 18 27 26 24   ALT 14* 24 25 19   ALKPHOS 110 105 147* 123  PROT 6.8 7.0 7.0 6.7  ALBUMIN 3.8 4.0 4.1 4.0    TUMOR MARKERS: Recent Labs    08/29/17 1021 09/28/17 0950 10/27/17 1000 11/24/17 1036  CHROMGRNA 7* 5 4 4     Assessment and Plan:  38-month status post left hepatic Y 90 radio embolization.  Follow-up dotatate PET imaging confirms interval reduction in size of the dominant left hepatic metastasis without significant metabolic activity to warrant further intervention at this time.  At least 2 small right hepatic metastases appear grossly stable when compared to the preprocedure MRI.  No evidence of significant extrahepatic abdominal or  pelvic disease.  No ascites.  Plan: Continue monthly lanreotide injections with Dr. Grayland Ormond.  No additional Y 90 radio immunization at this time.  Repeat surveillance MRI imaging in 3 months with an office visit.  Thank you for this interesting consult.  I greatly enjoyed meeting Jason Malone and look forward to participating in their care.  A copy of  this report was sent to the requesting provider on this date.  Electronically Signed: Greggory Keen 12/21/2017, 11:43 AM   I spent a total of    40 Minutes in face to face in clinical consultation, greater than 50% of which was counseling/coordinating care for this patient with metastatic neuroendocrine tumor to the liver.

## 2017-12-22 ENCOUNTER — Inpatient Hospital Stay: Payer: Medicare Other | Attending: Oncology

## 2017-12-22 ENCOUNTER — Inpatient Hospital Stay: Payer: Medicare Other

## 2017-12-22 ENCOUNTER — Encounter: Payer: Self-pay | Admitting: Oncology

## 2017-12-22 ENCOUNTER — Other Ambulatory Visit: Payer: Self-pay

## 2017-12-22 ENCOUNTER — Inpatient Hospital Stay (HOSPITAL_BASED_OUTPATIENT_CLINIC_OR_DEPARTMENT_OTHER): Payer: Medicare Other | Admitting: Oncology

## 2017-12-22 VITALS — BP 116/73 | HR 51 | Temp 97.8°F | Resp 16 | Ht 64.0 in | Wt 157.5 lb

## 2017-12-22 DIAGNOSIS — M8580 Other specified disorders of bone density and structure, unspecified site: Secondary | ICD-10-CM | POA: Insufficient documentation

## 2017-12-22 DIAGNOSIS — E785 Hyperlipidemia, unspecified: Secondary | ICD-10-CM | POA: Insufficient documentation

## 2017-12-22 DIAGNOSIS — I4891 Unspecified atrial fibrillation: Secondary | ICD-10-CM | POA: Insufficient documentation

## 2017-12-22 DIAGNOSIS — C7A8 Other malignant neuroendocrine tumors: Secondary | ICD-10-CM | POA: Insufficient documentation

## 2017-12-22 DIAGNOSIS — C7B02 Secondary carcinoid tumors of liver: Secondary | ICD-10-CM | POA: Insufficient documentation

## 2017-12-22 DIAGNOSIS — Z87891 Personal history of nicotine dependence: Secondary | ICD-10-CM | POA: Diagnosis not present

## 2017-12-22 DIAGNOSIS — I4892 Unspecified atrial flutter: Secondary | ICD-10-CM | POA: Insufficient documentation

## 2017-12-22 DIAGNOSIS — Z79899 Other long term (current) drug therapy: Secondary | ICD-10-CM | POA: Insufficient documentation

## 2017-12-22 DIAGNOSIS — N400 Enlarged prostate without lower urinary tract symptoms: Secondary | ICD-10-CM | POA: Diagnosis not present

## 2017-12-22 DIAGNOSIS — I10 Essential (primary) hypertension: Secondary | ICD-10-CM | POA: Diagnosis not present

## 2017-12-22 DIAGNOSIS — C7B8 Other secondary neuroendocrine tumors: Secondary | ICD-10-CM

## 2017-12-22 LAB — COMPREHENSIVE METABOLIC PANEL
ALK PHOS: 119 U/L (ref 38–126)
ALT: 19 U/L (ref 0–44)
AST: 20 U/L (ref 15–41)
Albumin: 4.2 g/dL (ref 3.5–5.0)
Anion gap: 8 (ref 5–15)
BUN: 18 mg/dL (ref 8–23)
CALCIUM: 9.5 mg/dL (ref 8.9–10.3)
CO2: 24 mmol/L (ref 22–32)
Chloride: 107 mmol/L (ref 98–111)
Creatinine, Ser: 0.86 mg/dL (ref 0.61–1.24)
Glucose, Bld: 130 mg/dL — ABNORMAL HIGH (ref 70–99)
Potassium: 4.3 mmol/L (ref 3.5–5.1)
Sodium: 139 mmol/L (ref 135–145)
Total Bilirubin: 0.8 mg/dL (ref 0.3–1.2)
Total Protein: 7 g/dL (ref 6.5–8.1)

## 2017-12-22 LAB — CBC WITH DIFFERENTIAL/PLATELET
Basophils Absolute: 0.1 10*3/uL (ref 0–0.1)
Basophils Relative: 1 %
Eosinophils Absolute: 0.2 10*3/uL (ref 0–0.7)
Eosinophils Relative: 2 %
HEMATOCRIT: 43.9 % (ref 40.0–52.0)
HEMOGLOBIN: 14.8 g/dL (ref 13.0–18.0)
LYMPHS ABS: 0.8 10*3/uL — AB (ref 1.0–3.6)
LYMPHS PCT: 9 %
MCH: 30.3 pg (ref 26.0–34.0)
MCHC: 33.6 g/dL (ref 32.0–36.0)
MCV: 90 fL (ref 80.0–100.0)
Monocytes Absolute: 1.2 10*3/uL — ABNORMAL HIGH (ref 0.2–1.0)
Monocytes Relative: 14 %
NEUTROS PCT: 74 %
Neutro Abs: 6.5 10*3/uL (ref 1.4–6.5)
Platelets: 173 10*3/uL (ref 150–440)
RBC: 4.88 MIL/uL (ref 4.40–5.90)
RDW: 13.8 % (ref 11.5–14.5)
WBC: 8.8 10*3/uL (ref 3.8–10.6)

## 2017-12-22 MED ORDER — LANREOTIDE ACETATE 120 MG/0.5ML ~~LOC~~ SOLN
120.0000 mg | Freq: Once | SUBCUTANEOUS | Status: AC
  Administered 2017-12-22: 120 mg via SUBCUTANEOUS
  Filled 2017-12-22: qty 120

## 2017-12-22 NOTE — Progress Notes (Signed)
Patient here for follow up. No changes since last appointment.  

## 2017-12-23 LAB — CHROMOGRANIN A: Chromogranin A: 4 nmol/L (ref 0–5)

## 2017-12-30 ENCOUNTER — Other Ambulatory Visit: Payer: Self-pay | Admitting: Cardiovascular Disease

## 2018-01-11 ENCOUNTER — Telehealth: Payer: Self-pay | Admitting: *Deleted

## 2018-01-11 NOTE — Telephone Encounter (Signed)
Does he need to contact his PCP regarding this? Do you want to order an MRI? Please clarify.

## 2018-01-11 NOTE — Telephone Encounter (Signed)
Not related to lantreotide.  Can get an mRI of that should to further evaluate.

## 2018-01-11 NOTE — Telephone Encounter (Signed)
Would prefer he contact pcp.

## 2018-01-11 NOTE — Telephone Encounter (Signed)
Call returned to patient and left message on voice mail for patient to contact his PCP

## 2018-01-11 NOTE — Telephone Encounter (Signed)
Patient called and reports that he gets Lanreotide injections and was given a paper with side effects to report to doctor. He reports that he is having pain in his right shoulder down into his shoulder blade and wants to know what to do about it, Please advise.

## 2018-01-17 ENCOUNTER — Ambulatory Visit (INDEPENDENT_AMBULATORY_CARE_PROVIDER_SITE_OTHER): Payer: Medicare Other

## 2018-01-17 DIAGNOSIS — Z23 Encounter for immunization: Secondary | ICD-10-CM

## 2018-01-19 ENCOUNTER — Inpatient Hospital Stay: Payer: Medicare Other

## 2018-01-19 ENCOUNTER — Inpatient Hospital Stay: Payer: Medicare Other | Attending: Oncology

## 2018-01-19 DIAGNOSIS — C7B8 Other secondary neuroendocrine tumors: Secondary | ICD-10-CM

## 2018-01-19 DIAGNOSIS — M8580 Other specified disorders of bone density and structure, unspecified site: Secondary | ICD-10-CM | POA: Diagnosis not present

## 2018-01-19 DIAGNOSIS — C7A8 Other malignant neuroendocrine tumors: Secondary | ICD-10-CM | POA: Diagnosis not present

## 2018-01-19 DIAGNOSIS — I4891 Unspecified atrial fibrillation: Secondary | ICD-10-CM | POA: Diagnosis not present

## 2018-01-19 DIAGNOSIS — I10 Essential (primary) hypertension: Secondary | ICD-10-CM | POA: Insufficient documentation

## 2018-01-19 DIAGNOSIS — Z87891 Personal history of nicotine dependence: Secondary | ICD-10-CM | POA: Diagnosis not present

## 2018-01-19 DIAGNOSIS — C7B02 Secondary carcinoid tumors of liver: Secondary | ICD-10-CM | POA: Insufficient documentation

## 2018-01-19 DIAGNOSIS — Z79899 Other long term (current) drug therapy: Secondary | ICD-10-CM | POA: Diagnosis not present

## 2018-01-19 DIAGNOSIS — I4892 Unspecified atrial flutter: Secondary | ICD-10-CM | POA: Diagnosis not present

## 2018-01-19 DIAGNOSIS — N400 Enlarged prostate without lower urinary tract symptoms: Secondary | ICD-10-CM | POA: Insufficient documentation

## 2018-01-19 DIAGNOSIS — E785 Hyperlipidemia, unspecified: Secondary | ICD-10-CM | POA: Diagnosis not present

## 2018-01-19 LAB — CBC WITH DIFFERENTIAL/PLATELET
Basophils Absolute: 0.1 10*3/uL (ref 0–0.1)
Basophils Relative: 1 %
Eosinophils Absolute: 0.3 10*3/uL (ref 0–0.7)
Eosinophils Relative: 5 %
HCT: 44.2 % (ref 40.0–52.0)
Hemoglobin: 14.8 g/dL (ref 13.0–18.0)
LYMPHS PCT: 11 %
Lymphs Abs: 0.8 10*3/uL — ABNORMAL LOW (ref 1.0–3.6)
MCH: 30.2 pg (ref 26.0–34.0)
MCHC: 33.6 g/dL (ref 32.0–36.0)
MCV: 89.8 fL (ref 80.0–100.0)
MONO ABS: 0.9 10*3/uL (ref 0.2–1.0)
MONOS PCT: 12 %
Neutro Abs: 5 10*3/uL (ref 1.4–6.5)
Neutrophils Relative %: 71 %
Platelets: 148 10*3/uL — ABNORMAL LOW (ref 150–440)
RBC: 4.92 MIL/uL (ref 4.40–5.90)
RDW: 14.2 % (ref 11.5–14.5)
WBC: 7.1 10*3/uL (ref 3.8–10.6)

## 2018-01-19 LAB — COMPREHENSIVE METABOLIC PANEL
ALBUMIN: 4.2 g/dL (ref 3.5–5.0)
ALT: 37 U/L (ref 0–44)
AST: 35 U/L (ref 15–41)
Alkaline Phosphatase: 122 U/L (ref 38–126)
Anion gap: 6 (ref 5–15)
BILIRUBIN TOTAL: 0.9 mg/dL (ref 0.3–1.2)
BUN: 23 mg/dL (ref 8–23)
CALCIUM: 9.6 mg/dL (ref 8.9–10.3)
CO2: 26 mmol/L (ref 22–32)
Chloride: 108 mmol/L (ref 98–111)
Creatinine, Ser: 0.86 mg/dL (ref 0.61–1.24)
GFR calc Af Amer: >60 mL/min (ref >60)
GFR calc non Af Amer: >60 mL/min (ref >60)
GLUCOSE: 126 mg/dL — AB (ref 70–99)
Potassium: 4.2 mmol/L (ref 3.5–5.1)
Sodium: 140 mmol/L (ref 135–145)
TOTAL PROTEIN: 7 g/dL (ref 6.5–8.1)

## 2018-01-19 MED ORDER — LANREOTIDE ACETATE 120 MG/0.5ML ~~LOC~~ SOLN
120.0000 mg | Freq: Once | SUBCUTANEOUS | Status: AC
  Administered 2018-01-19: 120 mg via SUBCUTANEOUS
  Filled 2018-01-19: qty 120

## 2018-01-20 LAB — CHROMOGRANIN A: Chromogranin A: 4 nmol/L (ref 0–5)

## 2018-01-27 ENCOUNTER — Other Ambulatory Visit: Payer: Self-pay

## 2018-01-27 ENCOUNTER — Encounter: Payer: Self-pay | Admitting: Emergency Medicine

## 2018-01-27 ENCOUNTER — Emergency Department: Payer: Medicare Other

## 2018-01-27 ENCOUNTER — Emergency Department
Admission: EM | Admit: 2018-01-27 | Discharge: 2018-01-27 | Disposition: A | Discharge status: Home / Self Care | Payer: Medicare Other | Attending: Emergency Medicine | Admitting: Emergency Medicine

## 2018-01-27 DIAGNOSIS — R079 Chest pain, unspecified: Secondary | ICD-10-CM | POA: Diagnosis not present

## 2018-01-27 DIAGNOSIS — Z87891 Personal history of nicotine dependence: Secondary | ICD-10-CM | POA: Diagnosis not present

## 2018-01-27 DIAGNOSIS — Z79899 Other long term (current) drug therapy: Secondary | ICD-10-CM | POA: Diagnosis not present

## 2018-01-27 DIAGNOSIS — R0602 Shortness of breath: Secondary | ICD-10-CM | POA: Diagnosis not present

## 2018-01-27 DIAGNOSIS — I10 Essential (primary) hypertension: Secondary | ICD-10-CM | POA: Diagnosis not present

## 2018-01-27 DIAGNOSIS — R001 Bradycardia, unspecified: Secondary | ICD-10-CM | POA: Diagnosis not present

## 2018-01-27 DIAGNOSIS — R0789 Other chest pain: Secondary | ICD-10-CM

## 2018-01-27 LAB — HEPATIC FUNCTION PANEL
ALT: 30 U/L (ref 0–44)
AST: 30 U/L (ref 15–41)
Albumin: 4.5 g/dL (ref 3.5–5.0)
Alkaline Phosphatase: 132 U/L — ABNORMAL HIGH (ref 38–126)
BILIRUBIN DIRECT: 0.1 mg/dL (ref 0.0–0.2)
Indirect Bilirubin: 0.7 mg/dL (ref 0.3–0.9)
Total Bilirubin: 0.8 mg/dL (ref 0.3–1.2)
Total Protein: 7.2 g/dL (ref 6.5–8.1)

## 2018-01-27 LAB — DIFFERENTIAL
Basophils Absolute: 0.1 10*3/uL (ref 0–0.1)
Basophils Relative: 1 %
Eosinophils Absolute: 0.3 10*3/uL (ref 0–0.7)
Eosinophils Relative: 3 %
Lymphocytes Relative: 8 %
Lymphs Abs: 0.9 10*3/uL — ABNORMAL LOW (ref 1.0–3.6)
MONO ABS: 1.1 10*3/uL — AB (ref 0.2–1.0)
MONOS PCT: 11 %
NEUTROS ABS: 8.2 10*3/uL — AB (ref 1.4–6.5)
Neutrophils Relative %: 77 %

## 2018-01-27 LAB — CBC
HEMATOCRIT: 45.2 % (ref 40.0–52.0)
HEMOGLOBIN: 15.4 g/dL (ref 13.0–18.0)
MCH: 30.7 pg (ref 26.0–34.0)
MCHC: 34 g/dL (ref 32.0–36.0)
MCV: 90.2 fL (ref 80.0–100.0)
Platelets: 164 10*3/uL (ref 150–440)
RBC: 5.01 MIL/uL (ref 4.40–5.90)
RDW: 14.4 % (ref 11.5–14.5)
WBC: 9 10*3/uL (ref 3.8–10.6)

## 2018-01-27 LAB — BASIC METABOLIC PANEL WITH GFR
Anion gap: 10 (ref 5–15)
BUN: 17 mg/dL (ref 8–23)
CO2: 25 mmol/L (ref 22–32)
Calcium: 9.6 mg/dL (ref 8.9–10.3)
Chloride: 105 mmol/L (ref 98–111)
Creatinine, Ser: 0.86 mg/dL (ref 0.61–1.24)
GFR calc Af Amer: >60 mL/min (ref >60)
GFR calc non Af Amer: >60 mL/min (ref >60)
Glucose, Bld: 130 mg/dL — ABNORMAL HIGH (ref 70–99)
Potassium: 4.1 mmol/L (ref 3.5–5.1)
Sodium: 140 mmol/L (ref 135–145)

## 2018-01-27 LAB — TROPONIN I
Troponin I: <0.03 ng/mL (ref <0.03)
Troponin I: <0.03 ng/mL (ref <0.03)

## 2018-01-27 NOTE — ED Provider Notes (Signed)
[facility] Emergency Department Provider Note   ____________________________________________   First MD Initiated Contact with Patient 01/27/18 1755     (approximate)  I have reviewed the triage vital signs and the nursing notes.   HISTORY  Chief Complaint Chest Pain; Shortness of Breath; and Chills   HPI Jason Malone is a 77 y.o. male who reports around 1:00 this afternoon he was eating developed a sharp stabbing kind of chest pain in the middle of his chest.  It would come and go.  It lasted about 5 seconds would go away for about a minute and a come back again his wife was timing it.  She said she could time it to just about every minute and it went up faster to about every 30 seconds.  It lasted from about 1 to about 430 and then resolved.  Pain did not radiate.  Pain was not associated with shortness of breath.  There was no increase in pain with activity or taking a deep breath.  There was no vomiting.  Patient said he felt like he might of been a little nauseated but he was not really sure and it went away rapidly.  Patient's blood pressure was elevated during this.  Patient has a history of liver cancer and now has 2 small spots on his liver besides the main one.   Past Medical History:  Diagnosis Date  . Allergic rhinitis   . Benign prostatic hypertrophy with lower urinary tract symptoms (LUTS)   . Chest pain    a. 05/2013 MV: EF 70%, no ischemia.  . Diverticulitis    DIVERTICULOSIS  . Enlarged prostate   . Essential hypertension    CONTROLLED ON MEDS  . Full dentures    upper and lower  . H/O hypokalemia   . Hx of atrial fibrillation without current medication    CARDIOLOGIST-DR [facility]  . 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 Hyperlipemia   . Hypomagnesemia   . IFG (impaired fasting glucose)   . Liver cancer ([place])   . Liver cancer ([place]) 12/08/2016  . Liver mass, left lobe 08/28/2016   Probably  hemangioma; will get MRI of liver, refer to GI  . Lung cancer ([place])    a. carcinoid, left lung, Stage 1b (T2a, N0, cM0);  b. 05/2013 s/p VATS & LULobectomy.  . Monocytosis   . Osteopenia   . Paroxysmal atrial flutter (HCC)    a. 03/2013->no recurrence;  b. CHA2DS2VASc = 2-->not currently on anticoagulation;  c. 05/2012 Echo: EF 55-60%, normal RV.  [name] Wears glasses     Patient Active Problem List   Diagnosis Date Noted  . Osteopenia determined by x-ray 01/07/2017  . 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 Low back pain 12/17/2016  . Liver mass, left lobe 08/28/2016  . 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 History of lung cancer 06/29/2016  . Aortic atherosclerosis ([place]) 04/06/2016  . History of colonic polyps 10/28/2015  . Medication monitoring encounter 08/15/2015  . Paroxysmal atrial flutter ([place])   . Elevated serum alkaline phosphatase level 03/05/2015  . Allergic rhinitis   . IFG (impaired fasting glucose)   . BPH without urinary obstruction   . Hyperlipidemia 02/13/2014  . Sinus bradycardia 07/18/2013  . S/P lobectomy of lung 06/04/2013  . Atrial flutter ([place]) 04/09/2013  . Smoking history 04/09/2013  . Essential hypertension 04/09/2013  . Rectal bleeding 12/15/2012  Past Surgical History:  Procedure Laterality Date  . BRONCHOSCOPY     04/06/2013  . CARDIOVASCULAR STRESS TEST  05/2013   a. No evidence of ischemia or infarct, EF 70%, no WMAs  . CATARACT EXTRACTION W/PHACO Left 07/14/2015   Procedure: CATARACT EXTRACTION PHACO AND INTRAOCULAR LENS PLACEMENT (IOC);  Surgeon: [name], MD;  Location: [place];  Service: Ophthalmology;  Laterality: Left;  . CATARACT EXTRACTION W/PHACO Right 10/01/2015   Procedure: CATARACT EXTRACTION PHACO AND INTRAOCULAR LENS PLACEMENT (IOC);  Surgeon: [name], MD;  Location: [place];  Service: Ophthalmology;  Laterality: Right;  . COLONOSCOPY W/  POLYPECTOMY     bleed after-had to go to surgery to stop bleeding via colonoscopy  . COLONOSCOPY WITH PROPOFOL N/A 11/19/2015   Procedure: COLONOSCOPY WITH PROPOFOL;  Surgeon: [name], MD;  Location: [facility] ENDOSCOPY;  Service: Endoscopy;  Laterality: N/A;  . DUPUYTREN CONTRACTURE RELEASE  12/15/2011   Procedure: DUPUYTREN CONTRACTURE RELEASE;  Surgeon: [name], MD;  Location: [place];  Service: Orthopedics;  Laterality: Left;  Fasciotomy left ring finger dupuytrens  . IR ANGIOGRAM SELECTIVE EACH ADDITIONAL VESSEL  07/26/2017  . IR ANGIOGRAM SELECTIVE EACH ADDITIONAL VESSEL  07/26/2017  . IR ANGIOGRAM SELECTIVE EACH ADDITIONAL VESSEL  07/26/2017  . IR ANGIOGRAM SELECTIVE EACH ADDITIONAL VESSEL  07/26/2017  . IR ANGIOGRAM SELECTIVE EACH ADDITIONAL VESSEL  07/26/2017  . IR ANGIOGRAM SELECTIVE EACH ADDITIONAL VESSEL  08/11/2017  . IR ANGIOGRAM VISCERAL SELECTIVE  07/26/2017  . IR ANGIOGRAM VISCERAL SELECTIVE  08/11/2017  . IR EMBO ARTERIAL NOT HEMORR HEMANG INC GUIDE ROADMAPPING  07/26/2017  . IR EMBO TUMOR ORGAN ISCHEMIA INFARCT INC GUIDE ROADMAPPING  08/11/2017  . IR RADIOLOGIST EVAL & MGMT  07/07/2017  . IR RADIOLOGIST EVAL & MGMT  09/07/2017  . IR RADIOLOGIST EVAL & MGMT  12/21/2017  . IR US GUIDE VASC ACCESS RIGHT  07/26/2017  . IR US GUIDE VASC ACCESS RIGHT  08/11/2017  . LUNG LOBECTOMY  06/10/13   upper left lung  . MULTIPLE TOOTH EXTRACTIONS    . REMOVAL RETAINED LENS Right 11/17/2015   Procedure: REMOVAL RETAINED LENS FROAGMENTS RIGHT EYE;  Surgeon: [name], MD;  Location: [place];  Service: Ophthalmology;  Laterality: Right;  . TONSILLECTOMY    . UPPER GASTROINTESTINAL ENDOSCOPY  11-03-15   Dr [name]  . VASECTOMY    . VIDEO ASSISTED THORACOSCOPY (VATS)/WEDGE RESECTION Left 06/04/2013   Procedure: VIDEO ASSISTED THORACOSCOPY (VATS)/WEDGE RESECTION;  Surgeon: [name], MD;  Location: [place];  Service: Thoracic;  Laterality: Left;  [name] VIDEO  BRONCHOSCOPY N/A 06/04/2013   Procedure: VIDEO BRONCHOSCOPY;  Surgeon: [name], MD;  Location: [facility] OR;  Service: Thoracic;  Laterality: N/A;    Prior to Admission medications   Medication Sig Start Date End Date Taking? Authorizing Provider  amLODipine (NORVASC) 2.5 MG tablet TAKE 1 TABLET BY MOUTH ONCE DAILY 12/30/17   Minna Merritts, MD  atorvastatin (LIPITOR) 40 MG tablet TAKE 1 TABLET BY MOUTH AT BEDTIME FOR CHOLESTEROL 08/16/17   Lada, Satira Anis, MD  b complex vitamins capsule Take 1 capsule by mouth daily. AM    [provider]  Calcium Carbonate-Vitamin D3 (CALCIUM 600/VITAMIN D) 600-400 MG-UNIT TABS Take by mouth daily.    [provider]  carvedilol (COREG) 3.125 MG tablet TAKE ONE TABLET BY MOUTH ONCE DAILY IN AM 02/28/17   Minna Merritts, MD  Cinnamon 500 MG capsule Take 500 mg by  mouth 2 (two) times daily. AM AND BEDTIME    [provider]  fexofenadine (ALLEGRA) 180 MG tablet Take 180 mg by mouth daily.    [provider]  finasteride (PROSCAR) 5 MG tablet TAKE 1 TABLET BY MOUTH ONCE DAILY 10/11/17   Lada, Melinda P, MD  losartan (COZAAR) 100 MG tablet TAKE 1 TABLET BY MOUTH ONCE DAILY 12/19/17   Gollan, Timothy J, MD  Multiple Vitamins-Minerals (MULTIVITAMIN WITH MINERALS) tablet Take 1 tablet by mouth daily. AM    [provider]  polycarbophil (FIBERCON) 625 MG tablet Take 625 mg by mouth daily. AM    [provider]  polyethylene glycol (MIRALAX / GLYCOLAX) packet Take 17 g by mouth daily as needed.     [provider]  tamsulosin (FLOMAX) 0.4 MG CAPS capsule TAKE ONE CAPSULE BY MOUTH ONCE DAILY 04/26/17   Lada, Melinda P, MD  vitamin C (ASCORBIC ACID) 500 MG tablet Take 500 mg by mouth daily. AM    [provider]    Allergies Latex and Tape  Family History  Problem Relation Age of Onset  . Stroke Mother   . Alzheimer's disease Mother   . Hypertension Sister   . Hyperlipidemia Sister   .  Hypertension Brother   . Hyperlipidemia Brother   . Diabetes Brother   . Cancer Sister        lung and colon    Social History Social History   Tobacco Use  . Smoking status: Former Smoker    Packs/day: 1.00    Years: 40.00    Pack years: 40.00    Types: Cigarettes    Last attempt to quit: 12/09/1998    Years since quitting: 19.1  . Smokeless tobacco: Never Used  Substance Use Topics  . Alcohol use: No  . Drug use: No    Review of Systems  Constitutional: No fever/chills Eyes: No visual changes. ENT: No sore throat. Cardiovascular: Denies chest pain at present. Respiratory: Denies shortness of breath. Gastrointestinal: No abdominal pain. no vomiting.  No diarrhea.  No constipation. Genitourinary: Negative for dysuria. Musculoskeletal: Negative for back pain. Skin: Negative for rash. Neurological: Negative for headaches, focal weakness   ____________________________________________   PHYSICAL EXAM:  VITAL SIGNS: ED Triage Vitals  Enc Vitals Group     BP 01/27/18 1425 140/79     Pulse Rate 01/27/18 1425 (!) 57     Resp 01/27/18 1425 20     Temp 01/27/18 1425 97.7 F (36.5 C)     Temp Source 01/27/18 1425 Oral     SpO2 01/27/18 1425 98 %     Weight 01/27/18 1426 154 lb (69.9 kg)     Height 01/27/18 1426 5' 4" (1.626 m)     Head Circumference --      Peak Flow --      Pain Score 01/27/18 1425 8     Pain Loc --      Pain Edu? --      Excl. in GC? --     Constitutional: Alert and oriented. Well appearing and in no acute distress. Eyes: Conjunctivae are normal. PER. EOMI. Head: Atraumatic. Nose: No congestion/rhinnorhea. Mouth/Throat: Mucous membranes are moist.  Oropharynx non-erythematous. Neck: No stridor.  Cardiovascular: Normal rate, regular rhythm. Grossly normal heart sounds.  Good peripheral circulation. Respiratory: Normal respiratory effort.  No retractions. Lungs CTAB. Gastrointestinal: Soft and nontender. No distention. No abdominal bruits. No  CVA tenderness. Musculoskeletal: No lower extremity tenderness nor edema.     Neurologic:  Normal speech and language. No gross focal neurologic deficits are appreciated. . Skin:  Skin is warm, dry and intact. No rash noted. Psychiatric: Mood and affect are normal. Speech and behavior are normal.  ____________________________________________   LABS (all labs ordered are listed, but only abnormal results are displayed)  Labs Reviewed  BASIC METABOLIC PANEL - Abnormal; Notable for the following components:      Result Value   Glucose, Bld 130 (*)    All other components within normal limits  HEPATIC FUNCTION PANEL - Abnormal; Notable for the following components:   Alkaline Phosphatase 132 (*)    All other components within normal limits  DIFFERENTIAL - Abnormal; Notable for the following components:   Neutro Abs 8.2 (*)    Lymphs Abs 0.9 (*)    Monocytes Absolute 1.1 (*)    All other components within normal limits  CBC  TROPONIN I  TROPONIN I   ____________________________________________  EKG  EKG read and interpreted by me shows sinus bradycardia rate of 58 normal axis there is no acute ST-T wave changes. ____________________________________________  RADIOLOGY  ED MD interpretation: Chest x-ray read by radiology reviewed by me shows no acute disease  Official radiology report(s): Dg Chest 2 View  Result Date: 01/27/2018 CLINICAL DATA:  Chest pain and shortness of breath. History of neuroendocrine tumor EXAM: CHEST - 2 VIEW COMPARISON:  PET-CT December 12 2017 FINDINGS: There is no edema or consolidation. Heart size and pulmonary vascularity are normal. No adenopathy. There is aortic atherosclerosis. There is degenerative change in the thoracic spine. No evident blastic or lytic bone lesions. IMPRESSION: No edema or consolidation. No neoplastic focus appreciable. There is aortic atherosclerosis. Aortic Atherosclerosis (ICD10-I70.0). Electronically Signed   By: Lowella Grip  III M.D.   On: 01/27/2018 14:54    ____________________________________________   PROCEDURES  Procedure(s) performed:   Procedures  Critical Care performed:   ____________________________________________   INITIAL IMPRESSION / ASSESSMENT AND PLAN / ED COURSE       Clinical Course as of Jan 28 1847  Fri Jan 27, 2018  1754 GFR, Est Non African American: >60 [PM]    Clinical Course User Index [PM] Nena Polio, MD   Patient's pain sounds more esophageal than anything.  Chest x-ray looks good.  He did not have any vomiting pain is gone now.  I will let him go.  He will come back if he has any further problems.  His alk phos is minimally elevated but I do not think at this point is significant without any abdominal pain or any other pain elsewhere.  ____________________________________________   FINAL CLINICAL IMPRESSION(S) / ED DIAGNOSES  Final diagnoses:  Atypical chest pain     ED Discharge Orders    None       Note:  This document was prepared using Dragon voice recognition software and may include unintentional dictation errors.    Nena Polio, MD 01/27/18 [phone]

## 2018-01-27 NOTE — ED Triage Notes (Signed)
Pt reports was eating and started with sudden onset of CP, SOB and chills. Pt reports currently undergoing treatment for liver cancer. Pt reports gets an injection Monthly starting in August.

## 2018-01-27 NOTE — Discharge Instructions (Addendum)
The chest pain you are having does not sound like it is her heart.  Heart blood work looks normal.  Just in case however we will have you call Dr. Rockey Situ your cardiologist.  He should be out of see you Monday.  Please return here if you get worse again.  Also return for fever vomiting or feeling sicker in general.

## 2018-02-15 ENCOUNTER — Other Ambulatory Visit (HOSPITAL_COMMUNITY): Payer: Self-pay | Admitting: Interventional Radiology

## 2018-02-15 ENCOUNTER — Other Ambulatory Visit: Payer: Self-pay | Admitting: Radiology

## 2018-02-15 DIAGNOSIS — C7B8 Other secondary neuroendocrine tumors: Secondary | ICD-10-CM

## 2018-02-16 ENCOUNTER — Other Ambulatory Visit: Payer: Self-pay

## 2018-02-16 ENCOUNTER — Inpatient Hospital Stay: Payer: Medicare Other | Attending: Oncology

## 2018-02-16 ENCOUNTER — Other Ambulatory Visit: Payer: Self-pay | Admitting: Family Medicine

## 2018-02-16 ENCOUNTER — Other Ambulatory Visit: Payer: Self-pay | Admitting: Cardiovascular Disease

## 2018-02-16 ENCOUNTER — Inpatient Hospital Stay: Payer: Medicare Other

## 2018-02-16 DIAGNOSIS — Z87891 Personal history of nicotine dependence: Secondary | ICD-10-CM | POA: Diagnosis not present

## 2018-02-16 DIAGNOSIS — I4891 Unspecified atrial fibrillation: Secondary | ICD-10-CM | POA: Insufficient documentation

## 2018-02-16 DIAGNOSIS — I4892 Unspecified atrial flutter: Secondary | ICD-10-CM | POA: Insufficient documentation

## 2018-02-16 DIAGNOSIS — C7A8 Other malignant neuroendocrine tumors: Secondary | ICD-10-CM

## 2018-02-16 DIAGNOSIS — Z79899 Other long term (current) drug therapy: Secondary | ICD-10-CM | POA: Insufficient documentation

## 2018-02-16 DIAGNOSIS — E785 Hyperlipidemia, unspecified: Secondary | ICD-10-CM | POA: Insufficient documentation

## 2018-02-16 DIAGNOSIS — M8580 Other specified disorders of bone density and structure, unspecified site: Secondary | ICD-10-CM | POA: Insufficient documentation

## 2018-02-16 DIAGNOSIS — I10 Essential (primary) hypertension: Secondary | ICD-10-CM | POA: Insufficient documentation

## 2018-02-16 DIAGNOSIS — N400 Enlarged prostate without lower urinary tract symptoms: Secondary | ICD-10-CM | POA: Diagnosis not present

## 2018-02-16 DIAGNOSIS — C7B02 Secondary carcinoid tumors of liver: Secondary | ICD-10-CM | POA: Insufficient documentation

## 2018-02-16 LAB — CBC WITH DIFFERENTIAL/PLATELET
ABS IMMATURE GRANULOCYTES: 0.03 10*3/uL (ref 0.00–0.07)
BASOS ABS: 0.1 10*3/uL (ref 0.0–0.1)
BASOS PCT: 1 %
EOS ABS: 0.3 10*3/uL (ref 0.0–0.5)
Eosinophils Relative: 3 %
HCT: 44.8 % (ref 39.0–52.0)
Hemoglobin: 14.5 g/dL (ref 13.0–17.0)
IMMATURE GRANULOCYTES: 0 %
Lymphocytes Relative: 9 %
Lymphs Abs: 0.8 10*3/uL (ref 0.7–4.0)
MCH: 29.4 pg (ref 26.0–34.0)
MCHC: 32.4 g/dL (ref 30.0–36.0)
MCV: 90.9 fL (ref 80.0–100.0)
Monocytes Absolute: 1.2 10*3/uL — ABNORMAL HIGH (ref 0.1–1.0)
Monocytes Relative: 13 %
NEUTROS ABS: 6.6 10*3/uL (ref 1.7–7.7)
NEUTROS PCT: 74 %
NRBC: 0 % (ref 0.0–0.2)
PLATELETS: 176 10*3/uL (ref 150–400)
RBC: 4.93 MIL/uL (ref 4.22–5.81)
RDW: 14.6 % (ref 11.5–15.5)
WBC: 9 10*3/uL (ref 4.0–10.5)

## 2018-02-16 LAB — COMPREHENSIVE METABOLIC PANEL
ALK PHOS: 125 U/L (ref 38–126)
ALT: 33 U/L (ref 0–44)
AST: 24 U/L (ref 15–41)
Albumin: 4.2 g/dL (ref 3.5–5.0)
Anion gap: 8 (ref 5–15)
BUN: 20 mg/dL (ref 8–23)
CALCIUM: 9.8 mg/dL (ref 8.9–10.3)
CO2: 27 mmol/L (ref 22–32)
Chloride: 108 mmol/L (ref 98–111)
Creatinine, Ser: 0.98 mg/dL (ref 0.61–1.24)
GFR calc non Af Amer: >60 mL/min (ref >60)
Glucose, Bld: 131 mg/dL — ABNORMAL HIGH (ref 70–99)
POTASSIUM: 4.7 mmol/L (ref 3.5–5.1)
SODIUM: 143 mmol/L (ref 135–145)
TOTAL PROTEIN: 7.1 g/dL (ref 6.5–8.1)
Total Bilirubin: 0.9 mg/dL (ref 0.3–1.2)

## 2018-02-16 MED ORDER — LANREOTIDE ACETATE 120 MG/0.5ML ~~LOC~~ SOLN
120.0000 mg | Freq: Once | SUBCUTANEOUS | Status: AC
  Administered 2018-02-16: 120 mg via SUBCUTANEOUS
  Filled 2018-02-16: qty 120

## 2018-02-17 LAB — CHROMOGRANIN A: Chromogranin A: 5 nmol/L (ref 0–5)

## 2018-02-17 NOTE — Telephone Encounter (Signed)
Last sgpt reviewed

## 2018-03-08 NOTE — Progress Notes (Signed)
Cardiology Office Note  Date:  03/09/2018   ID:  Jason Malone, DOB 07/28/1940, MRN [id number]  PCP:  Arnetha Courser, MD   Chief Complaint  Patient presents with  . OTHER    12 month f/u pt seen in ED for CP 01/2018 no complaints today. Meds reviewed verbally with pt.    HPI:  Jason Malone is a 77 year-old gentleman with long history of  smoking,   stopped 2001 lung cancer on the left, status post resection January 2015,  hypertension,  hyperlipidemia  hospital 03/29/2013 with tachycardia, shortness of breath, atrial flutter, lung CA  treated with rate controlling medications, and converted to normal sinus rhythm on arrival to the hospital with potassium 3.1, low magnesium felt secondary to his HCTZ. This was stopped. He presents today for follow-up of his atrial flutter  Recent episode of chest pain after eating lunch Central chest with no radiation Went to the emergency room January 27, 2018 Hospital records reviewed with the patient in detail Cardiac enzymes negative, EKG benign Reports that while sitting in the emergency room had some belching and symptoms resolved  No further episodes since that time  Discussed his cancer treatments On prior office visit had liver met from lung 3 cm x 2.6 cm Still receiving one shot once a month Radioactive seeds in liver  Lab work reviewed with him HBA1C 6.4 Total cholesterol 120 LDL 55  EKG personally reviewed by myself on todays visit Shows sinus bradycardia rate 54 bpm no significant ST or T wave changes  Previous studies CT chest  Mild aortic atherosclerosis, arch, descending aorta, Minimal CAD, calcification aortic valve (?left main)  Other past medical history reviewed Echocardiogram 03/29/2013 shows atrial flutter, ejection fraction 55-60%, otherwise normal study As mentioned, potassium on 03/29/2013 was 3.1 magnesium 1.6, cardiac enzymes negative, TSH 3.5  CT scan of the chest shows 4.6 x 4.1 x 4.2 cm left hilar mass  encasing the left pulmonary artery branches Recent lab work showing total cholesterol 171, HDL 39, LDL 86 prior  stress test 05/22/2013 showing no ischemia   PMH:   has a past medical history of Allergic rhinitis, Benign prostatic hypertrophy with lower urinary tract symptoms (LUTS), Chest pain, Diverticulitis, Enlarged prostate, Essential hypertension, Full dentures, H/O hypokalemia, atrial fibrillation without current medication, malignant carcinoid tumor of bronchus and lung (08/28/2016), Hyperlipemia, Hypomagnesemia, IFG (impaired fasting glucose), Liver cancer ([place]), Liver cancer ([place]) (12/08/2016), Liver mass, left lobe (08/28/2016), Lung cancer ([place]), Monocytosis, Osteopenia, Paroxysmal atrial flutter ([place]), and Wears glasses.  PSH:    Past Surgical History:  Procedure Laterality Date  . BRONCHOSCOPY     04/06/2013  . CARDIOVASCULAR STRESS TEST  05/2013   a. No evidence of ischemia or infarct, EF 70%, no WMAs  . CATARACT EXTRACTION W/PHACO Left 07/14/2015   Procedure: CATARACT EXTRACTION PHACO AND INTRAOCULAR LENS PLACEMENT (IOC);  Surgeon: [name], MD;  Location: [place];  Service: Ophthalmology;  Laterality: Left;  . CATARACT EXTRACTION W/PHACO Right 10/01/2015   Procedure: CATARACT EXTRACTION PHACO AND INTRAOCULAR LENS PLACEMENT (IOC);  Surgeon: [name], MD;  Location: [place];  Service: Ophthalmology;  Laterality: Right;  . COLONOSCOPY W/ POLYPECTOMY     bleed after-had to go to surgery to stop bleeding via colonoscopy  . COLONOSCOPY WITH PROPOFOL N/A 11/19/2015   Procedure: COLONOSCOPY WITH PROPOFOL;  Surgeon: [name], MD;  Location: [facility] ENDOSCOPY;  Service: Endoscopy;  Laterality: N/A;  . DUPUYTREN CONTRACTURE RELEASE  12/15/2011   Procedure:  DUPUYTREN CONTRACTURE RELEASE;  Surgeon: [name], MD;  Location: [place];  Service: Orthopedics;  Laterality: Left;  Fasciotomy left ring finger dupuytrens  . IR  ANGIOGRAM SELECTIVE EACH ADDITIONAL VESSEL  07/26/2017  . IR ANGIOGRAM SELECTIVE EACH ADDITIONAL VESSEL  07/26/2017  . IR ANGIOGRAM SELECTIVE EACH ADDITIONAL VESSEL  07/26/2017  . IR ANGIOGRAM SELECTIVE EACH ADDITIONAL VESSEL  07/26/2017  . IR ANGIOGRAM SELECTIVE EACH ADDITIONAL VESSEL  07/26/2017  . IR ANGIOGRAM SELECTIVE EACH ADDITIONAL VESSEL  08/11/2017  . IR ANGIOGRAM VISCERAL SELECTIVE  07/26/2017  . IR ANGIOGRAM VISCERAL SELECTIVE  08/11/2017  . IR EMBO ARTERIAL NOT HEMORR HEMANG INC GUIDE ROADMAPPING  07/26/2017  . IR EMBO TUMOR ORGAN ISCHEMIA INFARCT INC GUIDE ROADMAPPING  08/11/2017  . IR RADIOLOGIST EVAL & MGMT  07/07/2017  . IR RADIOLOGIST EVAL & MGMT  09/07/2017  . IR RADIOLOGIST EVAL & MGMT  12/21/2017  . IR US GUIDE VASC ACCESS RIGHT  07/26/2017  . IR US GUIDE VASC ACCESS RIGHT  08/11/2017  . LUNG LOBECTOMY  06/10/13   upper left lung  . MULTIPLE TOOTH EXTRACTIONS    . REMOVAL RETAINED LENS Right 11/17/2015   Procedure: REMOVAL RETAINED LENS FROAGMENTS RIGHT EYE;  Surgeon: [name], MD;  Location: [place];  Service: Ophthalmology;  Laterality: Right;  . TONSILLECTOMY    . UPPER GASTROINTESTINAL ENDOSCOPY  11-03-15   Dr [name]  . VASECTOMY    . VIDEO ASSISTED THORACOSCOPY (VATS)/WEDGE RESECTION Left 06/04/2013   Procedure: VIDEO ASSISTED THORACOSCOPY (VATS)/WEDGE RESECTION;  Surgeon: [name], MD;  Location: [place];  Service: Thoracic;  Laterality: Left;  [name] VIDEO BRONCHOSCOPY N/A 06/04/2013   Procedure: VIDEO BRONCHOSCOPY;  Surgeon: [name], MD;  Location: [facility] OR;  Service: Thoracic;  Laterality: N/A;    Current Outpatient Medications  Medication Sig Dispense Refill  . amLODipine (NORVASC) 2.5 MG tablet TAKE 1 TABLET BY MOUTH ONCE DAILY 90 tablet 2  . atorvastatin (LIPITOR) 40 MG tablet TAKE 1 TABLET BY MOUTH ONCE DAILY FOR CHOLESTEROL 90 tablet 1  . b complex vitamins capsule Take 1 capsule by mouth daily. AM    . Calcium Carbonate-Vitamin D3 (CALCIUM  600/VITAMIN D) 600-400 MG-UNIT TABS Take by mouth daily.    . carvedilol (COREG) 3.125 MG tablet TAKE 1 TABLET BY MOUTH ONCE DAILY IN THE MORNING 90 tablet 1  . Cinnamon 500 MG capsule Take 500 mg by mouth 2 (two) times daily. AM AND BEDTIME    . fexofenadine (ALLEGRA) 180 MG tablet Take 180 mg by mouth daily.    . finasteride (PROSCAR) 5 MG tablet TAKE 1 TABLET BY MOUTH ONCE DAILY 90 tablet 3  . losartan (COZAAR) 100 MG tablet TAKE 1 TABLET BY MOUTH ONCE DAILY 90 tablet 3  . Multiple Vitamins-Minerals (MULTIVITAMIN WITH MINERALS) tablet Take 1 tablet by mouth daily. AM    . polycarbophil (FIBERCON) 625 MG tablet Take 625 mg by mouth daily. AM    . polyethylene glycol (MIRALAX / GLYCOLAX) packet Take 17 g by mouth daily as needed.     . tamsulosin (FLOMAX) 0.4 MG CAPS capsule TAKE ONE CAPSULE BY MOUTH ONCE DAILY 90 capsule 3  . vitamin C (ASCORBIC ACID) 500 MG tablet Take 500 mg by mouth daily. AM     No current facility-administered medications for this visit.      Allergies:   Latex and Tape   Social History:  The patient  reports that he quit smoking about 19  years ago. His smoking use included cigarettes. He has a 40.00 pack-year smoking history. He has never used smokeless tobacco. He reports that he does not drink alcohol or use drugs.   Family History:   family history includes Alzheimer's disease in his mother; Cancer in his sister; Diabetes in his brother; Hyperlipidemia in his brother and sister; Hypertension in his brother and sister; Stroke in his mother.    Review of Systems: Review of Systems  Constitutional: Positive for malaise/fatigue.  Respiratory: Negative.   Cardiovascular: Negative.   Gastrointestinal: Negative.   Musculoskeletal: Negative.   Neurological: Negative.   Psychiatric/Behavioral: Negative.   All other systems reviewed and are negative.   PHYSICAL EXAM: VS:  BP 120/68 (BP Location: Left Arm, Patient Position: Sitting, Cuff Size: Normal)   Pulse (!)  54   Ht '5\' 4"'$  (1.626 m)   Wt 159 lb 12 oz (72.5 kg)   BMI 27.42 kg/m  , BMI Body mass index is 27.42 kg/m. Constitutional:  oriented to person, place, and time. No distress.  HENT:  Head: Grossly normal Eyes:  no discharge. No scleral icterus.  Neck: No JVD, no carotid bruits  Cardiovascular: Regular rate and rhythm, no murmurs appreciated Pulmonary/Chest: Clear to auscultation bilaterally, no wheezes or rails Abdominal: Soft.  no distension.  no tenderness.  Musculoskeletal: Normal range of motion Neurological:  normal muscle tone. Coordination normal. No atrophy Skin: Skin warm and dry Psychiatric: normal affect, pleasant  Recent Labs: 02/16/2018: ALT 33; BUN 20; Creatinine, Ser 0.98; Hemoglobin 14.5; Platelets 176; Potassium 4.7; Sodium 143    Lipid Panel Lab Results  Component Value Date   CHOL 120 10/04/2017   HDL 38 (L) 10/04/2017   LDLCALC 55 10/04/2017   TRIG 197 (H) 10/04/2017      Wt Readings from Last 3 Encounters:  03/09/18 159 lb 12 oz (72.5 kg)  01/27/18 154 lb (69.9 kg)  12/22/17 157 lb 8 oz (71.4 kg)       ASSESSMENT AND PLAN:  Chest pain Atypical in nature, hospital records reviewed Seem to resolve with belching consistent with indigestion No ischemic work-up needed at this time  Essential hypertension - Plan: EKG 12-Lead Blood pressure is well controlled on today's visit. No changes made to the medications Stable  Paroxysmal atrial flutter ([place]) - Plan: EKG 12-Lead  chronic bradycardia, asymptomatic Maintaining normal sinus rhythm  Mixed hyperlipidemia Cholesterol is at goal on the current lipid regimen. No changes to the medications were made. Stable  Sinus bradycardia Takes carvedilol once a day, suggested he could try to stop this he prefers to stay on for now and will consider stopping at a later date,  Smoking history Stopped 2001  S/P lobectomy of lung Metastasis to liver Discussed recent treatment of his cancer  Coronary  artery disease involving native heart without angina pectoris, unspecified vessel or lesion type CT scan heart chest minimal coronary calcifications noted No further ischemic work-up needed  Aortic atherosclerosis (HCC) Mild diffuse aortic atherosclerosis noted Cholesterol at goal   Total encounter time more than 25 minutes  Greater than 50% was spent in counseling and coordination of care with the patient   Disposition:   F/U  12 months   Orders Placed This Encounter  Procedures  . EKG 12-Lead     Signed, Esmond Plants, M.D., Ph.D. 03/09/2018  [place], [place]

## 2018-03-09 ENCOUNTER — Ambulatory Visit (INDEPENDENT_AMBULATORY_CARE_PROVIDER_SITE_OTHER): Payer: Medicare Other | Admitting: Cardiovascular Disease

## 2018-03-09 ENCOUNTER — Encounter: Payer: Self-pay | Admitting: Cardiovascular Disease

## 2018-03-09 VITALS — BP 120/68 | HR 54 | Ht 64.0 in | Wt 159.8 lb

## 2018-03-09 DIAGNOSIS — I10 Essential (primary) hypertension: Secondary | ICD-10-CM | POA: Diagnosis not present

## 2018-03-09 DIAGNOSIS — I700 Atherosclerosis of aorta: Secondary | ICD-10-CM

## 2018-03-09 DIAGNOSIS — R001 Bradycardia, unspecified: Secondary | ICD-10-CM

## 2018-03-09 DIAGNOSIS — E782 Mixed hyperlipidemia: Secondary | ICD-10-CM | POA: Diagnosis not present

## 2018-03-09 DIAGNOSIS — I2510 Atherosclerotic heart disease of native coronary artery without angina pectoris: Secondary | ICD-10-CM

## 2018-03-09 DIAGNOSIS — I4892 Unspecified atrial flutter: Secondary | ICD-10-CM

## 2018-03-09 DIAGNOSIS — Z87891 Personal history of nicotine dependence: Secondary | ICD-10-CM

## 2018-03-09 NOTE — Patient Instructions (Signed)

## 2018-03-12 NOTE — Progress Notes (Signed)
[place]  Telephone:([phone]) [phone] Fax:(336) [phone]  ID: Danne Harbor OB: May 19, 1940  MR#: [id number]  SWF#:[id number]  Patient Care Team: Arnetha Courser, MD as PCP - General (Family Medicine) [name], Forest Gleason, MD (General Surgery) Nestor Lewandowsky, MD as Referring Physician (Cardiothoracic Surgery) Minna Merritts, MD as Consulting Physician (Cardiology) [name], MD as Consulting Physician (Cardiothoracic Surgery) Lloyd Huger, MD as Consulting Physician (Oncology) Sanda Klein Satira Anis, MD as Attending Physician (Family Medicine)  CHIEF COMPLAINT: 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  INTERVAL HISTORY: Patient returns to clinic today for further evaluation and continuation of lanreotide.  He continues to feel well and remains asymptomatic.  He has no neurologic complaints.  He denies any weakness or fatigue.  He has a good appetite and denies weight loss.  He has no chest pain or shortness of breath.  He denies any abdominal pain.  He has no nausea, vomiting, constipation, or diarrhea.  He has no urinary complaints.  Patient feels at his baseline offers no specific complaints today.  REVIEW OF SYSTEMS:   Review of Systems  Constitutional: Negative.  Negative for fever, malaise/fatigue and weight loss.  Respiratory: Negative.  Negative for cough, hemoptysis and shortness of breath.   Cardiovascular: Negative.  Negative for chest pain and leg swelling.  Gastrointestinal: Negative.  Negative for abdominal pain, diarrhea, nausea and vomiting.  Genitourinary: Negative.  Negative for dysuria.  Musculoskeletal: Negative.  Negative for back pain.  Skin: Negative.  Negative for rash.  Neurological: Negative.  Negative for dizziness, sensory change, focal weakness and weakness.  Psychiatric/Behavioral: Negative.  The patient is not nervous/anxious.     As per HPI. Otherwise, a complete review of systems is negative.  PAST  MEDICAL HISTORY: Past Medical History:  Diagnosis Date  . Allergic rhinitis   . Benign prostatic hypertrophy with lower urinary tract symptoms (LUTS)   . Chest pain    a. 05/2013 MV: EF 70%, no ischemia.  . Diverticulitis    DIVERTICULOSIS  . Enlarged prostate   . Essential hypertension    CONTROLLED ON MEDS  . Full dentures    upper and lower  . H/O hypokalemia   . Hx of atrial fibrillation without current medication    CARDIOLOGIST-DR [facility]  . 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 Hyperlipemia   . Hypomagnesemia   . IFG (impaired fasting glucose)   . Liver cancer ([place])   . Liver cancer ([place]) 12/08/2016  . Liver mass, left lobe 08/28/2016   Probably hemangioma; will get MRI of liver, refer to GI  . Lung cancer ([place])    a. carcinoid, left lung, Stage 1b (T2a, N0, cM0);  b. 05/2013 s/p VATS & LULobectomy.  . Monocytosis   . Osteopenia   . Paroxysmal atrial flutter ([facility])    a. 03/2013->no recurrence;  b. CHA2DS2VASc = 2-->not currently on anticoagulation;  c. 05/2012 Echo: EF 55-60%, normal RV.  [name] Wears glasses     PAST SURGICAL HISTORY: Past Surgical History:  Procedure Laterality Date  . BRONCHOSCOPY     04/06/2013  . CARDIOVASCULAR STRESS TEST  05/2013   a. No evidence of ischemia or infarct, EF 70%, no WMAs  . CATARACT EXTRACTION W/PHACO Left 07/14/2015   Procedure: CATARACT EXTRACTION PHACO AND INTRAOCULAR LENS PLACEMENT (IOC);  Surgeon: [name], MD;  Location: [place];  Service: Ophthalmology;  Laterality: Left;  . CATARACT EXTRACTION W/PHACO Right 10/01/2015  Procedure: CATARACT EXTRACTION PHACO AND INTRAOCULAR LENS PLACEMENT (IOC);  Surgeon: [name], MD;  Location: [place];  Service: Ophthalmology;  Laterality: Right;  . COLONOSCOPY W/ POLYPECTOMY     bleed after-had to go to surgery to stop bleeding via colonoscopy  . COLONOSCOPY WITH PROPOFOL N/A 11/19/2015   Procedure: COLONOSCOPY WITH  PROPOFOL;  Surgeon: [name], MD;  [facility] ENDOSCOPY;  Service: Endoscopy;  Laterality: N/A;  . DUPUYTREN CONTRACTURE RELEASE  12/15/2011   Procedure: DUPUYTREN CONTRACTURE RELEASE;  Surgeon: [name], MD;  Location: [place];  Service: Orthopedics;  Laterality: Left;  Fasciotomy left ring finger dupuytrens  . IR ANGIOGRAM SELECTIVE EACH ADDITIONAL VESSEL  07/26/2017  . IR ANGIOGRAM SELECTIVE EACH ADDITIONAL VESSEL  07/26/2017  . IR ANGIOGRAM SELECTIVE EACH ADDITIONAL VESSEL  07/26/2017  . IR ANGIOGRAM SELECTIVE EACH ADDITIONAL VESSEL  07/26/2017  . IR ANGIOGRAM SELECTIVE EACH ADDITIONAL VESSEL  07/26/2017  . IR ANGIOGRAM SELECTIVE EACH ADDITIONAL VESSEL  08/11/2017  . IR ANGIOGRAM VISCERAL SELECTIVE  07/26/2017  . IR ANGIOGRAM VISCERAL SELECTIVE  08/11/2017  . IR EMBO ARTERIAL NOT HEMORR HEMANG INC GUIDE ROADMAPPING  07/26/2017  . IR EMBO TUMOR ORGAN ISCHEMIA INFARCT INC GUIDE ROADMAPPING  08/11/2017  . IR RADIOLOGIST EVAL & MGMT  07/07/2017  . IR RADIOLOGIST EVAL & MGMT  09/07/2017  . IR RADIOLOGIST EVAL & MGMT  12/21/2017  . IR US GUIDE VASC ACCESS RIGHT  07/26/2017  . IR US GUIDE VASC ACCESS RIGHT  08/11/2017  . LUNG LOBECTOMY  06/10/13   upper left lung  . MULTIPLE TOOTH EXTRACTIONS    . REMOVAL RETAINED LENS Right 11/17/2015   Procedure: REMOVAL RETAINED LENS FROAGMENTS RIGHT EYE;  Surgeon: [name], MD;  Location: [place];  Service: Ophthalmology;  Laterality: Right;  . TONSILLECTOMY    . UPPER GASTROINTESTINAL ENDOSCOPY  11-03-15   Dr [name]  . VASECTOMY    . VIDEO ASSISTED THORACOSCOPY (VATS)/WEDGE RESECTION Left 06/04/2013   Procedure: VIDEO ASSISTED THORACOSCOPY (VATS)/WEDGE RESECTION;  Surgeon: [name], MD;  Location: [place];  Service: Thoracic;  Laterality: Left;  [name] VIDEO BRONCHOSCOPY N/A 06/04/2013   Procedure: VIDEO BRONCHOSCOPY;  Surgeon: [name], MD;  Location: [facility] OR;  Service: Thoracic;  Laterality: N/A;    FAMILY  HISTORY: Family History  Problem Relation Age of Onset  . Stroke Mother   . Alzheimer's disease Mother   . Hypertension Sister   . Hyperlipidemia Sister   . Hypertension Brother   . Hyperlipidemia Brother   . Diabetes Brother   . Cancer Sister        lung and colon    ADVANCED DIRECTIVES (Y/N):  N  HEALTH MAINTENANCE: Social History   Tobacco Use  . Smoking status: Former Smoker    Packs/day: 1.00    Years: 40.00    Pack years: 40.00    Types: Cigarettes    Last attempt to quit: 12/09/1998    Years since quitting: 19.2  . Smokeless tobacco: Never Used  Substance Use Topics  . Alcohol use: No  . Drug use: No     Colonoscopy:  PAP:  Bone density:  Lipid panel:  Allergies  Allergen Reactions  . Latex Itching    Tape only- REACTED TO BANDAGE ON ABDOMEN  . Tape Itching    Surgical tapes     Current Outpatient Medications  Medication Sig Dispense Refill  . amLODipine (NORVASC) 2.5 MG tablet TAKE 1 TABLET BY MOUTH  ONCE DAILY 90 tablet 2  . atorvastatin (LIPITOR) 40 MG tablet TAKE 1 TABLET BY MOUTH ONCE DAILY FOR CHOLESTEROL 90 tablet 1  . b complex vitamins capsule Take 1 capsule by mouth daily. AM    . Calcium Carbonate-Vitamin D3 (CALCIUM 600/VITAMIN D) 600-400 MG-UNIT TABS Take by mouth daily.    . carvedilol (COREG) 3.125 MG tablet TAKE 1 TABLET BY MOUTH ONCE DAILY IN THE MORNING 90 tablet 1  . Cinnamon 500 MG capsule Take 500 mg by mouth 2 (two) times daily. AM AND BEDTIME    . fexofenadine (ALLEGRA) 180 MG tablet Take 180 mg by mouth daily.    . finasteride (PROSCAR) 5 MG tablet TAKE 1 TABLET BY MOUTH ONCE DAILY 90 tablet 3  . losartan (COZAAR) 100 MG tablet TAKE 1 TABLET BY MOUTH ONCE DAILY 90 tablet 3  . Multiple Vitamins-Minerals (MULTIVITAMIN WITH MINERALS) tablet Take 1 tablet by mouth daily. AM    . polycarbophil (FIBERCON) 625 MG tablet Take 625 mg by mouth daily. AM    . polyethylene glycol (MIRALAX / GLYCOLAX) packet Take 17 g by mouth daily as  needed.     . tamsulosin (FLOMAX) 0.4 MG CAPS capsule TAKE ONE CAPSULE BY MOUTH ONCE DAILY 90 capsule 3  . vitamin C (ASCORBIC ACID) 500 MG tablet Take 500 mg by mouth daily. AM     No current facility-administered medications for this visit.     OBJECTIVE: Vitals:   03/16/18 1048 03/16/18 1052  BP: 126/77   Pulse: (!) 50   Resp: 18   Temp: (!) 97.5 F (36.4 C)   SpO2:  98%     Body mass index is 27.52 kg/m.    ECOG FS:0 - Asymptomatic  General: Well-developed, well-nourished, no acute distress. Eyes: Pink conjunctiva, anicteric sclera. HEENT: Normocephalic, moist mucous membranes. Lungs: Clear to auscultation bilaterally. Heart: Regular rate and rhythm. No rubs, murmurs, or gallops. Abdomen: Soft, nontender, nondistended. No organomegaly noted, normoactive bowel sounds. Musculoskeletal: No edema, cyanosis, or clubbing. Neuro: Alert, answering all questions appropriately. Cranial nerves grossly intact. Skin: No rashes or petechiae noted. Psych: Normal affect.  LAB RESULTS:  Lab Results  Component Value Date   NA 142 03/16/2018   K 4.6 03/16/2018   CL 106 03/16/2018   CO2 29 03/16/2018   GLUCOSE 130 (H) 03/16/2018   BUN 15 03/16/2018   CREATININE 0.87 03/16/2018   CALCIUM 9.6 03/16/2018   PROT 7.1 03/16/2018   ALBUMIN 4.3 03/16/2018   AST 22 03/16/2018   ALT 25 03/16/2018   ALKPHOS 119 03/16/2018   BILITOT 0.7 03/16/2018   GFRNONAA >60 03/16/2018   GFRAA >60 03/16/2018    Lab Results  Component Value Date   WBC 8.1 03/16/2018   NEUTROABS 5.8 03/16/2018   HGB 14.7 03/16/2018   HCT 45.0 03/16/2018   MCV 91.1 03/16/2018   PLT 166 03/16/2018     STUDIES: No results found.  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  PLAN:    1. 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 Patient underwent left upper lobe lobectomy  in [place] on June 04, 2013. Final pathology results revealed a well differentiated neuroendocrine tumor with positive bronchial margins. Patient did not receive adjuvant XRT or chemotherapy at that time.  MRI results from June 27, 2017 reviewed independently with progression of patient's known disease in his liver.  His chromogranin A  levels are also trending up slightly with his most recent result reported at 5.  Patient had his most recent Y-90 injection on August 11, 2017.  Repeat imaging with Dotatate PET scan on December 12, 2017 reviewed independently and report as above with improvement of patient's disease burden.  Appreciate interventional radiology input.  Proceed with 120 mg subcutaneous lanreotide today.  Return to clinic in 4 and 8 weeks for lanreotide only and then in 12 weeks for further evaluation and continuation of treatment.  Patient has an MRI scheduled for Monday, March 20, 2018 with follow-up with interventional radiology several days later to discuss the results.   2.  Diarrhea: Patient does not complain of this today.  I spent a total of 30 minutes face-to-face with the patient of which greater than 50% of the visit was spent in counseling and coordination of care as detailed above.   Patient expressed understanding and was in agreement with this plan. He also understands that He can call clinic at any time with any questions, concerns, or complaints.   Cancer Staging Neuroendocrine carcinoma of lung [facility]) Staging form: Lung, AJCC 8th Edition - Clinical stage from 12/28/2016: Stage IVA (cT2a, cN0, cM1b) - Signed by Lloyd Huger, MD on 12/28/2016   Lloyd Huger, MD   03/17/2018 10:10 AM

## 2018-03-13 ENCOUNTER — Other Ambulatory Visit: Payer: Self-pay | Admitting: *Deleted

## 2018-03-13 DIAGNOSIS — C7A8 Other malignant neuroendocrine tumors: Secondary | ICD-10-CM

## 2018-03-13 NOTE — Progress Notes (Signed)
bc

## 2018-03-16 ENCOUNTER — Encounter: Payer: Self-pay | Admitting: Oncology

## 2018-03-16 ENCOUNTER — Inpatient Hospital Stay: Payer: Medicare Other | Attending: Oncology

## 2018-03-16 ENCOUNTER — Other Ambulatory Visit: Payer: Self-pay

## 2018-03-16 ENCOUNTER — Inpatient Hospital Stay (HOSPITAL_BASED_OUTPATIENT_CLINIC_OR_DEPARTMENT_OTHER): Payer: Medicare Other | Admitting: Oncology

## 2018-03-16 ENCOUNTER — Inpatient Hospital Stay: Payer: Medicare Other

## 2018-03-16 VITALS — BP 126/77 | HR 50 | Temp 97.5°F | Resp 18 | Wt 160.3 lb

## 2018-03-16 DIAGNOSIS — Z87891 Personal history of nicotine dependence: Secondary | ICD-10-CM | POA: Insufficient documentation

## 2018-03-16 DIAGNOSIS — N400 Enlarged prostate without lower urinary tract symptoms: Secondary | ICD-10-CM | POA: Diagnosis not present

## 2018-03-16 DIAGNOSIS — C7A8 Other malignant neuroendocrine tumors: Secondary | ICD-10-CM | POA: Diagnosis not present

## 2018-03-16 DIAGNOSIS — C7B02 Secondary carcinoid tumors of liver: Secondary | ICD-10-CM | POA: Insufficient documentation

## 2018-03-16 DIAGNOSIS — Z79899 Other long term (current) drug therapy: Secondary | ICD-10-CM | POA: Diagnosis not present

## 2018-03-16 DIAGNOSIS — M8580 Other specified disorders of bone density and structure, unspecified site: Secondary | ICD-10-CM | POA: Insufficient documentation

## 2018-03-16 DIAGNOSIS — I10 Essential (primary) hypertension: Secondary | ICD-10-CM | POA: Insufficient documentation

## 2018-03-16 DIAGNOSIS — E785 Hyperlipidemia, unspecified: Secondary | ICD-10-CM | POA: Insufficient documentation

## 2018-03-16 DIAGNOSIS — I4891 Unspecified atrial fibrillation: Secondary | ICD-10-CM | POA: Diagnosis not present

## 2018-03-16 LAB — COMPREHENSIVE METABOLIC PANEL
ALBUMIN: 4.3 g/dL (ref 3.5–5.0)
ALT: 25 U/L (ref 0–44)
AST: 22 U/L (ref 15–41)
Alkaline Phosphatase: 119 U/L (ref 38–126)
Anion gap: 7 (ref 5–15)
BUN: 15 mg/dL (ref 8–23)
CALCIUM: 9.6 mg/dL (ref 8.9–10.3)
CHLORIDE: 106 mmol/L (ref 98–111)
CO2: 29 mmol/L (ref 22–32)
CREATININE: 0.87 mg/dL (ref 0.61–1.24)
GFR calc Af Amer: >60 mL/min (ref >60)
GFR calc non Af Amer: >60 mL/min (ref >60)
GLUCOSE: 130 mg/dL — AB (ref 70–99)
POTASSIUM: 4.6 mmol/L (ref 3.5–5.1)
SODIUM: 142 mmol/L (ref 135–145)
Total Bilirubin: 0.7 mg/dL (ref 0.3–1.2)
Total Protein: 7.1 g/dL (ref 6.5–8.1)

## 2018-03-16 LAB — CBC WITH DIFFERENTIAL/PLATELET
Abs Immature Granulocytes: 0.03 10*3/uL (ref 0.00–0.07)
BASOS ABS: 0.1 10*3/uL (ref 0.0–0.1)
BASOS PCT: 1 %
EOS ABS: 0.3 10*3/uL (ref 0.0–0.5)
Eosinophils Relative: 3 %
HEMATOCRIT: 45 % (ref 39.0–52.0)
Hemoglobin: 14.7 g/dL (ref 13.0–17.0)
Immature Granulocytes: 0 %
LYMPHS ABS: 0.8 10*3/uL (ref 0.7–4.0)
Lymphocytes Relative: 10 %
MCH: 29.8 pg (ref 26.0–34.0)
MCHC: 32.7 g/dL (ref 30.0–36.0)
MCV: 91.1 fL (ref 80.0–100.0)
Monocytes Absolute: 1.1 10*3/uL — ABNORMAL HIGH (ref 0.1–1.0)
Monocytes Relative: 14 %
Neutro Abs: 5.8 10*3/uL (ref 1.7–7.7)
Neutrophils Relative %: 72 %
PLATELETS: 166 10*3/uL (ref 150–400)
RBC: 4.94 MIL/uL (ref 4.22–5.81)
RDW: 14.2 % (ref 11.5–15.5)
WBC: 8.1 10*3/uL (ref 4.0–10.5)
nRBC: 0 % (ref 0.0–0.2)

## 2018-03-16 MED ORDER — LANREOTIDE ACETATE 120 MG/0.5ML ~~LOC~~ SOLN
120.0000 mg | Freq: Once | SUBCUTANEOUS | Status: AC
  Administered 2018-03-16: 120 mg via SUBCUTANEOUS
  Filled 2018-03-16: qty 120

## 2018-03-16 NOTE — Progress Notes (Signed)
Patient here for follow up. No concerns voiced.  °

## 2018-03-17 LAB — CHROMOGRANIN A: Chromogranin A: 5 nmol/L (ref 0–5)

## 2018-03-20 ENCOUNTER — Ambulatory Visit
Admission: RE | Admit: 2018-03-20 | Discharge: 2018-03-20 | Disposition: A | Discharge status: Home / Self Care | Payer: Medicare Other | Source: Ambulatory Visit | Attending: Interventional Radiology | Admitting: Interventional Radiology

## 2018-03-20 DIAGNOSIS — D3A8 Other benign neuroendocrine tumors: Secondary | ICD-10-CM | POA: Diagnosis not present

## 2018-03-20 DIAGNOSIS — C7B8 Other secondary neuroendocrine tumors: Secondary | ICD-10-CM

## 2018-03-20 DIAGNOSIS — C787 Secondary malignant neoplasm of liver and intrahepatic bile duct: Secondary | ICD-10-CM | POA: Diagnosis not present

## 2018-03-20 DIAGNOSIS — C759 Malignant neoplasm of endocrine gland, unspecified: Secondary | ICD-10-CM | POA: Diagnosis not present

## 2018-03-20 MED ORDER — GADOBUTROL 1 MMOL/ML IV SOLN
7.0000 mL | Freq: Once | INTRAVENOUS | Status: AC | PRN
  Administered 2018-03-20: 7 mL via INTRAVENOUS

## 2018-03-22 ENCOUNTER — Encounter: Payer: Self-pay | Admitting: Radiology

## 2018-03-22 ENCOUNTER — Ambulatory Visit
Admission: RE | Admit: 2018-03-22 | Discharge: 2018-03-22 | Disposition: A | Discharge status: Home / Self Care | Payer: Medicare Other | Source: Ambulatory Visit | Attending: Interventional Radiology | Admitting: Interventional Radiology

## 2018-03-22 DIAGNOSIS — C7B02 Secondary carcinoid tumors of liver: Secondary | ICD-10-CM | POA: Diagnosis not present

## 2018-03-22 DIAGNOSIS — C7B8 Other secondary neuroendocrine tumors: Secondary | ICD-10-CM

## 2018-03-22 HISTORY — PX: IR RADIOLOGIST EVAL & MGMT: IMG5224

## 2018-03-22 NOTE — Progress Notes (Signed)
Patient ID: AYDN FERRARA, male   DOB: 06/16/1940, 77 y.o.   MRN: [id number]       Chief Complaint:  56-month status post left hepatic Y 90 radial embolization  Referring Physician(s): Yaron Grasse  History of Present Illness: Rogan Wigley Lazarz is a 77 y.o. male with stage IVa metastatic neuroendocrine tumor to the liver.  He returns for outpatient follow-up.  He is now 51-month status post left hepatic Y 90 radioembolization.  Overall he continues to feel very well.  He remains asymptomatic.  No significant abdominal pain, weakness or fatigue.  No complaints of diarrhea or flushing syndrome.  No interval fevers or illness.  Stable weight and appetite.  No current chest pain, shortness of breath or abdominal pain.  Overall he remains at his baseline.  He returns for review of his surveillance MRI.  Past Medical History:  Diagnosis Date  . Allergic rhinitis   . Benign prostatic hypertrophy with lower urinary tract symptoms (LUTS)   . Chest pain    a. 05/2013 MV: EF 70%, no ischemia.  . Diverticulitis    DIVERTICULOSIS  . Enlarged prostate   . Essential hypertension    CONTROLLED ON MEDS  . Full dentures    upper and lower  . H/O hypokalemia   . Hx of atrial fibrillation without current medication    CARDIOLOGIST-DR [facility]  . 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 Hyperlipemia   . Hypomagnesemia   . IFG (impaired fasting glucose)   . Liver cancer ([place])   . Liver cancer ([place]) 12/08/2016  . Liver mass, left lobe 08/28/2016   Probably hemangioma; will get MRI of liver, refer to GI  . Lung cancer ([place])    a. carcinoid, left lung, Stage 1b (T2a, N0, cM0);  b. 05/2013 s/p VATS & LULobectomy.  . Monocytosis   . Osteopenia   . Paroxysmal atrial flutter ([facility])    a. 03/2013->no recurrence;  b. CHA2DS2VASc = 2-->not currently on anticoagulation;  c. 05/2012 Echo: EF 55-60%, normal RV.  [name] Wears glasses     Past Surgical History:  Procedure Laterality Date  .  BRONCHOSCOPY     04/06/2013  . CARDIOVASCULAR STRESS TEST  05/2013   a. No evidence of ischemia or infarct, EF 70%, no WMAs  . CATARACT EXTRACTION W/PHACO Left 07/14/2015   Procedure: CATARACT EXTRACTION PHACO AND INTRAOCULAR LENS PLACEMENT (IOC);  Surgeon: [name], MD;  Location: [place];  Service: Ophthalmology;  Laterality: Left;  . CATARACT EXTRACTION W/PHACO Right 10/01/2015   Procedure: CATARACT EXTRACTION PHACO AND INTRAOCULAR LENS PLACEMENT (IOC);  Surgeon: [name], MD;  Location: [place];  Service: Ophthalmology;  Laterality: Right;  . COLONOSCOPY W/ POLYPECTOMY     bleed after-had to go to surgery to stop bleeding via colonoscopy  . COLONOSCOPY WITH PROPOFOL N/A 11/19/2015   Procedure: COLONOSCOPY WITH PROPOFOL;  Surgeon: [name], MD;  Location: [facility] ENDOSCOPY;  Service: Endoscopy;  Laterality: N/A;  . DUPUYTREN CONTRACTURE RELEASE  12/15/2011   Procedure: DUPUYTREN CONTRACTURE RELEASE;  Surgeon: [name], MD;  [facility] [place];  Service: Orthopedics;  Laterality: Left;  Fasciotomy left ring finger dupuytrens  . IR ANGIOGRAM SELECTIVE EACH ADDITIONAL VESSEL  07/26/2017  . IR ANGIOGRAM SELECTIVE EACH ADDITIONAL VESSEL  07/26/2017  . IR ANGIOGRAM SELECTIVE EACH ADDITIONAL VESSEL  07/26/2017  . IR ANGIOGRAM SELECTIVE EACH ADDITIONAL VESSEL  07/26/2017  . IR ANGIOGRAM SELECTIVE EACH ADDITIONAL VESSEL  07/26/2017  .  IR ANGIOGRAM SELECTIVE EACH ADDITIONAL VESSEL  08/11/2017  . IR ANGIOGRAM VISCERAL SELECTIVE  07/26/2017  . IR ANGIOGRAM VISCERAL SELECTIVE  08/11/2017  . IR EMBO ARTERIAL NOT HEMORR HEMANG INC GUIDE ROADMAPPING  07/26/2017  . IR EMBO TUMOR ORGAN ISCHEMIA INFARCT INC GUIDE ROADMAPPING  08/11/2017  . IR RADIOLOGIST EVAL & MGMT  07/07/2017  . IR RADIOLOGIST EVAL & MGMT  09/07/2017  . IR RADIOLOGIST EVAL & MGMT  12/21/2017  . IR US GUIDE VASC ACCESS RIGHT  07/26/2017  . IR US GUIDE VASC ACCESS RIGHT  08/11/2017  . LUNG  LOBECTOMY  06/10/13   upper left lung  . MULTIPLE TOOTH EXTRACTIONS    . REMOVAL RETAINED LENS Right 11/17/2015   Procedure: REMOVAL RETAINED LENS FROAGMENTS RIGHT EYE;  Surgeon: [name], MD;  Location: [place];  Service: Ophthalmology;  Laterality: Right;  . TONSILLECTOMY    . UPPER GASTROINTESTINAL ENDOSCOPY  11-03-15   Dr [name]  . VASECTOMY    . VIDEO ASSISTED THORACOSCOPY (VATS)/WEDGE RESECTION Left 06/04/2013   Procedure: VIDEO ASSISTED THORACOSCOPY (VATS)/WEDGE RESECTION;  Surgeon: [name], MD;  Location: [place];  Service: Thoracic;  Laterality: Left;  [name] VIDEO BRONCHOSCOPY N/A 06/04/2013   Procedure: VIDEO BRONCHOSCOPY;  Surgeon: [name], MD;  Location: [facility] OR;  Service: Thoracic;  Laterality: N/A;    Allergies: Latex and Tape  Medications: Prior to Admission medications   Medication Sig Start Date End Date Taking? Authorizing Provider  amLODipine (NORVASC) 2.5 MG tablet TAKE 1 TABLET BY MOUTH ONCE DAILY 12/30/17   Minna Merritts, MD  atorvastatin (LIPITOR) 40 MG tablet TAKE 1 TABLET BY MOUTH ONCE DAILY FOR CHOLESTEROL 02/17/18   Lada, Satira Anis, MD  b complex vitamins capsule Take 1 capsule by mouth daily. AM    [provider]  Calcium Carbonate-Vitamin D3 (CALCIUM 600/VITAMIN D) 600-400 MG-UNIT TABS Take by mouth daily.    [provider]  carvedilol (COREG) 3.125 MG tablet TAKE 1 TABLET BY MOUTH ONCE DAILY IN THE MORNING 02/16/18   Gollan, Kathlene November, MD  Cinnamon 500 MG capsule Take 500 mg by mouth 2 (two) times daily. AM AND BEDTIME    [provider]  fexofenadine (ALLEGRA) 180 MG tablet Take 180 mg by mouth daily.    [provider]  finasteride (PROSCAR) 5 MG tablet TAKE 1 TABLET BY MOUTH ONCE DAILY 10/11/17   Lada, Satira Anis, MD  losartan (COZAAR) 100 MG tablet TAKE 1 TABLET BY MOUTH ONCE DAILY 12/19/17   Minna Merritts, MD  Multiple Vitamins-Minerals (MULTIVITAMIN WITH MINERALS) tablet Take 1  tablet by mouth daily. AM    [provider]  polycarbophil (FIBERCON) 625 MG tablet Take 625 mg by mouth daily. AM    [provider]  polyethylene glycol (MIRALAX / GLYCOLAX) packet Take 17 g by mouth daily as needed.     [provider]  tamsulosin (FLOMAX) 0.4 MG CAPS capsule TAKE ONE CAPSULE BY MOUTH ONCE DAILY 04/26/17   Lada, Satira Anis, MD  vitamin C (ASCORBIC ACID) 500 MG tablet Take 500 mg by mouth daily. AM    [provider]     Family History  Problem Relation Age of Onset  . Stroke Mother   . Alzheimer's disease Mother   . Hypertension Sister   . Hyperlipidemia Sister   . Hypertension Brother   . Hyperlipidemia Brother   . Diabetes Brother   . Cancer Sister  lung and colon    Social History   Socioeconomic History  . Marital status: Married    Spouse name: Not on file  . Number of children: Not on file  . Years of education: Not on file  . Highest education level: Not on file  Occupational History  . Not on file  Social Needs  . Financial resource strain: Not on file  . Food insecurity:    Worry: Not on file    Inability: Not on file  . Transportation needs:    Medical: Not on file    Non-medical: Not on file  Tobacco Use  . Smoking status: Former Smoker    Packs/day: 1.00    Years: 40.00    Pack years: 40.00    Types: Cigarettes    Last attempt to quit: 12/09/1998    Years since quitting: 19.2  . Smokeless tobacco: Never Used  Substance and Sexual Activity  . Alcohol use: No  . Drug use: No  . Sexual activity: Yes  Lifestyle  . Physical activity:    Days per week: Not on file    Minutes per session: Not on file  . Stress: Not on file  Relationships  . Social connections:    Talks on phone: Not on file    Gets together: Not on file    Attends religious service: Not on file    Active member of club or organization: Not on file    Attends meetings of clubs or organizations: Not on file    Relationship  status: Not on file  Other Topics Concern  . Not on file  Social History Narrative  . Not on file    ECOG Status: 1 - Symptomatic but completely ambulatory  Review of Systems: A 12 point ROS discussed and pertinent positives are indicated in the HPI above.  All other systems are negative.  Review of Systems  Vital Signs: BP (!) 142/74   Pulse (!) 52   Temp 97.7 F (36.5 C) (Oral)   Resp 14   Ht 5\' 4"  (1.626 m)   Wt 70.8 kg   SpO2 97%   BMI 26.78 kg/m   Physical Exam  Constitutional: He is oriented to person, place, and time. He appears well-developed and well-nourished. No distress.  Eyes: Conjunctivae are normal. No scleral icterus.  Cardiovascular: Normal rate and regular rhythm.  Pulmonary/Chest: Effort normal and breath sounds normal.  Abdominal: Soft. Bowel sounds are normal. He exhibits no distension.  Musculoskeletal: Normal range of motion. He exhibits no edema.  Neurological: He is alert and oriented to person, place, and time.  Skin: He is not diaphoretic.      Imaging: Mr Abdomen Wwo Contrast  Result Date: 03/20/2018 CLINICAL DATA:  Well differentiated neuroendocrine tumor with liver metastasis. Patient status post arterial directed radio embolization of the LEFT hepatic lobe with yttrium 90 microspheres on 08/11/2017. EXAM: MRI ABDOMEN WITHOUT AND WITH CONTRAST TECHNIQUE: Multiplanar multisequence MR imaging of the abdomen was performed both before and after the administration of intravenous contrast. CONTRAST:  7 mL Gadavist COMPARISON:  Radioembolization 08/11/2017, PET-CT scan 12/12/2017, MRI abdomen 06/27/2017 FINDINGS: Lower chest:  Lung bases are clear. Hepatobiliary: Metastatic lesions are best depicted on T2 weighted sequences. The radioablation site in the central LEFT hepatic lobe is decreased in size lesion measures 2.9 by 2.2 cm (image 11/9) compared with 4.2 by 4.2 cm. Lesion has mild peripheral enhancement (series 15). Two additional lesions are  present within the RIGHT hepatic lobe.  These are best seen on the diffusion weighted T2 weighted series. Lesion in the subcapsular RIGHT hepatic lobe is increased in size measuring 2.3 by 2.0 cm (image 9/9) increased from 1.6 by 1.3 cm. Lesion in the more inferior RIGHT hepatic lobe along the posterior margin measures 1.3 cm compared with 0.6 cm on comparison MRI 06/27/2017. The larger of these 2 lesions demonstrates mild enhancement (image 23/13). Mild activity of these lesions is seen on comparison DOTATATE PET-CT scan. No new lesions are identified. Pancreas: Normal pancreatic parenchymal intensity. No ductal dilatation or inflammation. Spleen: A central lesion liver which is high signal intensity on T2 weighted imaging and demonstrates no DOTATATE activity is favored a cyst or hemangioma. Adrenals/urinary tract: Adrenal glands and kidneys are normal. Stomach/Bowel: Stomach and limited of the small bowel is unremarkable Vascular/Lymphatic: Abdominal aortic normal caliber. No retroperitoneal periportal lymphadenopathy. Musculoskeletal: No aggressive osseous lesion IMPRESSION: 1. Interval decrease in size LEFT hepatic lobe lesion following radio embolization. 2. Interval increase in size of two RIGHT hepatic lobe lesions consistent mild progression of neuroendocrine tumor. No new sites identified within liver. Electronically Signed   By: Suzy Bouchard M.D.   On: 03/20/2018 12:55    Labs:  CBC: Recent Labs    01/19/18 1118 01/27/18 1432 02/16/18 1031 03/16/18 1033  WBC 7.1 9.0 9.0 8.1  HGB 14.8 15.4 14.5 14.7  HCT 44.2 45.2 44.8 45.0  PLT 148* 164 176 166    COAGS: Recent Labs    07/26/17 0748 08/11/17 0738  INR 1.02 0.94    BMP: Recent Labs    01/19/18 1118 01/27/18 1432 02/16/18 1031 03/16/18 1033  NA 140 140 143 142  K 4.2 4.1 4.7 4.6  CL 108 105 108 106  CO2 26 25 27 29   GLUCOSE 126* 130* 131* 130*  BUN 23 17 20 15   CALCIUM 9.6 9.6 9.8 9.6  CREATININE 0.86 0.86 0.98  0.87  GFRNONAA >60 >60 >60 >60  GFRAA >60 >60 >60 >60    LIVER FUNCTION TESTS: Recent Labs    01/19/18 1118 01/27/18 1809 02/16/18 1031 03/16/18 1033  BILITOT 0.9 0.8 0.9 0.7  AST 35 30 24 22   ALT 37 30 33 25  ALKPHOS 122 132* 125 119  PROT 7.0 7.2 7.1 7.1  ALBUMIN 4.2 4.5 4.2 4.3    TUMOR MARKERS: Recent Labs    12/22/17 1025 01/19/18 1118 02/16/18 1031 03/16/18 1033  CHROMGRNA 4 4 5 5     Assessment and Plan:  7 status post left hepatic Y 90 radial embolization.  Follow-up MRI confirms interval reduction in size of the dominant left hepatic metastasis with some residual peripheral enhancement.  Unfortunately, there are 2 enlarging but still small right hepatic metastases by MRI when compared to February 2019.  No evidence of extrahepatic disease.  No ascites.  Plan: Continue monthly Sandostatin injections with Dr. Grayland Ormond.  Plan for right hepatic Y 90 radio embolization in the next few weeks for the 2 enlarging right hepatic metastases  Repeat posttreatment surveillance MRI 3 months after the right hepatic Y 90.  Thank you for this interesting consult.  I greatly enjoyed meeting Cambren W Ogawa and look forward to participating in their care.  A copy of this report was sent to the requesting provider on this date.  Electronically Signed: Greggory Keen 03/22/2018, 11:24 AM   I spent a total of    25 Minutes in face to face in clinical consultation, greater than 50% of which was counseling/coordinating care for  this patient with metastatic neuroendocrine tumor to the liver

## 2018-03-28 ENCOUNTER — Other Ambulatory Visit (HOSPITAL_COMMUNITY): Payer: Self-pay | Admitting: Interventional Radiology

## 2018-03-28 DIAGNOSIS — D3A8 Other benign neuroendocrine tumors: Secondary | ICD-10-CM

## 2018-03-28 DIAGNOSIS — C787 Secondary malignant neoplasm of liver and intrahepatic bile duct: Secondary | ICD-10-CM

## 2018-04-10 ENCOUNTER — Ambulatory Visit: Payer: Medicare Other | Admitting: Cardiovascular Disease

## 2018-04-10 ENCOUNTER — Encounter: Payer: Self-pay | Admitting: Family Medicine

## 2018-04-10 ENCOUNTER — Ambulatory Visit (INDEPENDENT_AMBULATORY_CARE_PROVIDER_SITE_OTHER): Payer: Medicare Other | Admitting: Family Medicine

## 2018-04-10 DIAGNOSIS — I2510 Atherosclerotic heart disease of native coronary artery without angina pectoris: Secondary | ICD-10-CM | POA: Diagnosis not present

## 2018-04-10 DIAGNOSIS — E119 Type 2 diabetes mellitus without complications: Secondary | ICD-10-CM | POA: Diagnosis not present

## 2018-04-10 DIAGNOSIS — I4892 Unspecified atrial flutter: Secondary | ICD-10-CM

## 2018-04-10 DIAGNOSIS — R7301 Impaired fasting glucose: Secondary | ICD-10-CM | POA: Diagnosis not present

## 2018-04-10 DIAGNOSIS — E782 Mixed hyperlipidemia: Secondary | ICD-10-CM | POA: Diagnosis not present

## 2018-04-10 DIAGNOSIS — Z5181 Encounter for therapeutic drug level monitoring: Secondary | ICD-10-CM

## 2018-04-10 DIAGNOSIS — C7A8 Other malignant neuroendocrine tumors: Secondary | ICD-10-CM

## 2018-04-10 DIAGNOSIS — I10 Essential (primary) hypertension: Secondary | ICD-10-CM | POA: Diagnosis not present

## 2018-04-10 NOTE — Assessment & Plan Note (Signed)
With mets to the liver; managed by specialists

## 2018-04-10 NOTE — Assessment & Plan Note (Signed)
Seeing Dr. Rockey Situ and asymptomatic

## 2018-04-10 NOTE — Progress Notes (Signed)
BP 116/62   Pulse 61   Temp 98.1 F (36.7 C) (Oral)   Ht 5\' 4"  (1.626 m)   Wt 158 lb 6.4 oz (71.8 kg)   SpO2 98%   BMI 27.19 kg/m    Subjective:    Patient ID: Jason Malone, male    DOB: [birth year], 77 y.o.   MRN: [id number]  HPI: LAKEEM ROZO is a 77 y.o. male  Chief Complaint  Patient presents with  . Follow-up    HPI Patient is here for f/u  Neuroendo tumor with liver mets He has cancer; he gets one shot every 4 weeks; on octreotide agonist; he gets diarrhea and then constipation No RUQ pain; coughs but that is not any worse and same as when seeing oncologist Testing on December 20th; implant in the liver, Dr. Greggory [employer] is okay  Saw Dr. Rockey Situ on Halloween; no changes to meds No chest pain; indigestion; cardiologist note reviewed; relieved with belching; he will try to drink soda Paroxysmal atrial fibrillation; he does not feel it come and go; some SHOB, particularly in hot shower, bending over, other times, walking up hill; no leg edema; cannot lay on back, lays on side; stops breathing in his sleep says his wife; he is aware of risk of death, but does not want testing or mask or anything  High cholesterol; on statin; no aches or pains; limits fatty pig meats, just once in a while; occasional red meat; likes cheese; some oatmeal and cereal  Prediabetes; no dry mouth; shot increases sugars; no vision blurriness; three labs recently, glucose was 130, 131, 130 and they were all fasting   Lab Results  Component Value Date   CHOL 120 10/04/2017   HDL 38 (L) 10/04/2017   LDLCALC 55 10/04/2017   TRIG 197 (H) 10/04/2017   CHOLHDL 3.2 10/04/2017    Depression screen PHQ 2/9 04/10/2018 10/04/2017 02/14/2017 01/12/2017 12/17/2016  Decreased Interest 0 0 0 0 0  Down, Depressed, Hopeless 0 0 0 0 0  PHQ - 2 Score 0 0 0 0 0  Altered sleeping 0 - - - -  Tired, decreased energy 0 - - - -  Change in appetite 0 - - - -  Feeling bad or failure about yourself  0 - - - -   Trouble concentrating 0 - - - -  Moving slowly or fidgety/restless 0 - - - -  Suicidal thoughts 0 - - - -  PHQ-9 Score 0 - - - -  Difficult doing work/chores Not difficult at all - - - -   Fall Risk  04/10/2018 10/04/2017 02/14/2017 01/12/2017 12/17/2016  Falls in the past year? 0 No No No Yes  Number falls in past yr: 0 - - - 1  Injury with Fall? - - - - Yes  Comment - - - - Twisted his ankle     Relevant past medical, surgical, family and social history reviewed Past Medical History:  Diagnosis Date  . Allergic rhinitis   . Benign prostatic hypertrophy with lower urinary tract symptoms (LUTS)   . Chest pain    a. 05/2013 MV: EF 70%, no ischemia.  . Diverticulitis    DIVERTICULOSIS  . Enlarged prostate   . Essential hypertension    CONTROLLED ON MEDS  . Full dentures    upper and lower  . H/O hypokalemia   . Hx of atrial fibrillation without current medication    CARDIOLOGIST-DR [facility]  . Hx of malignant  carcinoid tumor of bronchus and lung 08/28/2016  . Hyperlipemia   . Hypomagnesemia   . IFG (impaired fasting glucose)   . Liver cancer ([place])   . Liver cancer ([place]) 12/08/2016  . Liver mass, left lobe 08/28/2016   Probably hemangioma; will get MRI of liver, refer to GI  . Lung cancer ([place])    a. carcinoid, left lung, Stage 1b (T2a, N0, cM0);  b. 05/2013 s/p VATS & LULobectomy.  . Monocytosis   . Osteopenia   . Paroxysmal atrial flutter (HCC)    a. 03/2013->no recurrence;  b. CHA2DS2VASc = 2-->not currently on anticoagulation;  c. 05/2012 Echo: EF 55-60%, normal RV.  [name] Wears glasses    Past Surgical History:  Procedure Laterality Date  . BRONCHOSCOPY     04/06/2013  . CARDIOVASCULAR STRESS TEST  05/2013   a. No evidence of ischemia or infarct, EF 70%, no WMAs  . CATARACT EXTRACTION W/PHACO Left 07/14/2015   Procedure: CATARACT EXTRACTION PHACO AND INTRAOCULAR LENS PLACEMENT (IOC);  Surgeon: [name], MD;  Location: [place];  Service: Ophthalmology;   Laterality: Left;  . CATARACT EXTRACTION W/PHACO Right 10/01/2015   Procedure: CATARACT EXTRACTION PHACO AND INTRAOCULAR LENS PLACEMENT (IOC);  Surgeon: [name], MD;  Location: [place];  Service: Ophthalmology;  Laterality: Right;  . COLONOSCOPY W/ POLYPECTOMY     bleed after-had to go to surgery to stop bleeding via colonoscopy  . COLONOSCOPY WITH PROPOFOL N/A 11/19/2015   Procedure: COLONOSCOPY WITH PROPOFOL;  Surgeon: [name], MD;  Location: [facility] ENDOSCOPY;  Service: Endoscopy;  Laterality: N/A;  . DUPUYTREN CONTRACTURE RELEASE  12/15/2011   Procedure: DUPUYTREN CONTRACTURE RELEASE;  Surgeon: [name], MD;  Location: [place];  Service: Orthopedics;  Laterality: Left;  Fasciotomy left ring finger dupuytrens  . IR ANGIOGRAM SELECTIVE EACH ADDITIONAL VESSEL  07/26/2017  . IR ANGIOGRAM SELECTIVE EACH ADDITIONAL VESSEL  07/26/2017  . IR ANGIOGRAM SELECTIVE EACH ADDITIONAL VESSEL  07/26/2017  . IR ANGIOGRAM SELECTIVE EACH ADDITIONAL VESSEL  07/26/2017  . IR ANGIOGRAM SELECTIVE EACH ADDITIONAL VESSEL  07/26/2017  . IR ANGIOGRAM SELECTIVE EACH ADDITIONAL VESSEL  08/11/2017  . IR ANGIOGRAM VISCERAL SELECTIVE  07/26/2017  . IR ANGIOGRAM VISCERAL SELECTIVE  08/11/2017  . IR EMBO ARTERIAL NOT HEMORR HEMANG INC GUIDE ROADMAPPING  07/26/2017  . IR EMBO TUMOR ORGAN ISCHEMIA INFARCT INC GUIDE ROADMAPPING  08/11/2017  . IR RADIOLOGIST EVAL & MGMT  07/07/2017  . IR RADIOLOGIST EVAL & MGMT  09/07/2017  . IR RADIOLOGIST EVAL & MGMT  12/21/2017  . IR RADIOLOGIST EVAL & MGMT  03/22/2018  . IR US GUIDE VASC ACCESS RIGHT  07/26/2017  . IR US GUIDE VASC ACCESS RIGHT  08/11/2017  . LUNG LOBECTOMY  06/10/13   upper left lung  . MULTIPLE TOOTH EXTRACTIONS    . REMOVAL RETAINED LENS Right 11/17/2015   Procedure: REMOVAL RETAINED LENS FROAGMENTS RIGHT EYE;  Surgeon: [name], MD;  Location: [place];  Service: Ophthalmology;  Laterality: Right;  . TONSILLECTOMY     . UPPER GASTROINTESTINAL ENDOSCOPY  11-03-15   Dr [name]  . VASECTOMY    . VIDEO ASSISTED THORACOSCOPY (VATS)/WEDGE RESECTION Left 06/04/2013   Procedure: VIDEO ASSISTED THORACOSCOPY (VATS)/WEDGE RESECTION;  Surgeon: [name], MD;  Location: [place];  Service: Thoracic;  Laterality: Left;  [name] VIDEO BRONCHOSCOPY N/A 06/04/2013   Procedure: VIDEO BRONCHOSCOPY;  Surgeon: [name], MD;  Location: [place];  Service: Thoracic;  Laterality: N/A;  Family History  Problem Relation Age of Onset  . Stroke Mother   . Alzheimer's disease Mother   . Hypertension Sister   . Hyperlipidemia Sister   . Hypertension Brother   . Hyperlipidemia Brother   . Diabetes Brother   . Cancer Sister        lung and colon   Social History   Tobacco Use  . Smoking status: Former Smoker    Packs/day: 1.00    Years: 40.00    Pack years: 40.00    Types: Cigarettes    Last attempt to quit: 12/09/1998    Years since quitting: 19.3  . Smokeless tobacco: Never Used  Substance Use Topics  . Alcohol use: No  . Drug use: No     [facility] Visit from 04/10/2018 in [facility]  AUDIT-C Score  0      Interim medical history since last visit reviewed. Allergies and medications reviewed  Review of Systems Per HPI unless specifically indicated above     Objective:    BP 116/62   Pulse 61   Temp 98.1 F (36.7 C) (Oral)   Ht 5\' 4"  (1.626 m)   Wt 158 lb 6.4 oz (71.8 kg)   SpO2 98%   BMI 27.19 kg/m   Wt Readings from Last 3 Encounters:  04/10/18 158 lb 6.4 oz (71.8 kg)  03/22/18 156 lb (70.8 kg)  03/16/18 160 lb 4.8 oz (72.7 kg)    Physical Exam  Constitutional: He appears well-developed and well-nourished. No distress.  HENT:  Head: Normocephalic and atraumatic.  Eyes: EOM are normal. No scleral icterus.  Neck: No thyromegaly present.  Cardiovascular: Regular rhythm. Bradycardia present.  Pulmonary/Chest: Effort normal and breath sounds normal.  Abdominal: Soft. Bowel  sounds are normal. He exhibits no distension.  Musculoskeletal: He exhibits no edema.  Neurological: Coordination normal.  Skin: Skin is warm and dry. No pallor.  Psychiatric: He has a normal mood and affect. His behavior is normal. Judgment and thought content normal.   Diabetic Foot Form - Detailed   Diabetic Foot Exam - detailed Diabetic Foot exam was performed with the following findings:  Yes 04/10/2018 12:06 PM  Visual Foot Exam completed.:  Yes  Pulse Foot Exam completed.:  Yes  Right Dorsalis Pedis:  Present Left Dorsalis Pedis:  Present  Sensory Foot Exam Completed.:  Yes Semmes-Weinstein Monofilament Test R Site 1-Great Toe:  Pos L Site 1-Great Toe:  Pos        Results for orders placed or performed in visit on 03/16/18  Chromogranin A  Result Value Ref Range   Chromogranin A 5 0 - 5 nmol/L  Comprehensive metabolic panel  Result Value Ref Range   Sodium 142 135 - 145 mmol/L   Potassium 4.6 3.5 - 5.1 mmol/L   Chloride 106 98 - 111 mmol/L   CO2 29 22 - 32 mmol/L   Glucose, Bld 130 (H) 70 - 99 mg/dL   BUN 15 8 - 23 mg/dL   Creatinine, Ser 0.87 0.61 - 1.24 mg/dL   Calcium 9.6 8.9 - 10.3 mg/dL   Total Protein 7.1 6.5 - 8.1 g/dL   Albumin 4.3 3.5 - 5.0 g/dL   AST 22 15 - 41 U/L   ALT 25 0 - 44 U/L   Alkaline Phosphatase 119 38 - 126 U/L   Total Bilirubin 0.7 0.3 - 1.2 mg/dL   GFR calc non Af Amer >60 >60 mL/min   GFR calc Af Amer >60 >60  mL/min   Anion gap 7 5 - 15  CBC with Differential/Platelet  Result Value Ref Range   WBC 8.1 4.0 - 10.5 K/uL   RBC 4.94 4.22 - 5.81 MIL/uL   Hemoglobin 14.7 13.0 - 17.0 g/dL   HCT 45.0 39.0 - 52.0 %   MCV 91.1 80.0 - 100.0 fL   MCH 29.8 26.0 - 34.0 pg   MCHC 32.7 30.0 - 36.0 g/dL   RDW 14.2 11.5 - 15.5 %   Platelets 166 150 - 400 K/uL   nRBC 0.0 0.0 - 0.2 %   Neutrophils Relative % 72 %   Neutro Abs 5.8 1.7 - 7.7 K/uL   Lymphocytes Relative 10 %   Lymphs Abs 0.8 0.7 - 4.0 K/uL   Monocytes Relative 14 %   Monocytes  Absolute 1.1 (H) 0.1 - 1.0 K/uL   Eosinophils Relative 3 %   Eosinophils Absolute 0.3 0.0 - 0.5 K/uL   Basophils Relative 1 %   Basophils Absolute 0.1 0.0 - 0.1 K/uL   Immature Granulocytes 0 %   Abs Immature Granulocytes 0.03 0.00 - 0.07 K/uL      Assessment & Plan:   Problem List Items Addressed This Visit      Cardiovascular and Mediastinum   Paroxysmal atrial flutter (HCC)    Paroxysmal; currently in sinus rhythm      Essential hypertension    Excellent today; avoid NSAIDs, decongestants      Coronary artery disease involving native heart without angina pectoris    Seeing Dr. 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[place])    With mets to the liver; managed by specialists        Endocrine   Type 2 diabetes mellitus ([place])    Brand new diagnosis based on last three glucose readings; check glucose today fasting and a1c and urine microalbumin; refer to diabetic educator; eye exams every year, note to [facility]      Relevant Orders   Ambulatory referral to diabetic education   Hemoglobin A1c   Microalbumin / creatinine urine ratio   IFG (impaired fasting glucose)    Converting diagnosis to TYPE 2 DIABETES MELLITUS today        Other   Medication monitoring encounter    Check kidneys and liver      Hyperlipidemia    Check lipids; proud of him for walking so much and raising what is surely an inherited low HDL      Relevant Orders   Lipid panel       Follow up plan: Return in about 2 months (around 06/11/2018) for follow-up visit with Dr. Sanda Klein.  An after-visit summary was printed and given to the patient at [place].  Please see the patient instructions which may contain other information and recommendations beyond what is mentioned above in the assessment and plan.  No orders of the defined types were placed in this encounter.   Orders Placed This Encounter  Procedures  . Hemoglobin A1c  . Lipid panel    . Microalbumin / creatinine urine ratio  . Ambulatory referral to diabetic education

## 2018-04-10 NOTE — Assessment & Plan Note (Signed)
Check lipids; proud of him for walking so much and raising what is surely an inherited low HDL

## 2018-04-10 NOTE — Assessment & Plan Note (Signed)
Paroxysmal; currently in sinus rhythm

## 2018-04-10 NOTE — Assessment & Plan Note (Signed)
Brand new diagnosis based on last three glucose readings; check glucose today fasting and a1c and urine microalbumin; refer to diabetic educator; eye exams every year, note to [facility]

## 2018-04-10 NOTE — Assessment & Plan Note (Addendum)
Converting diagnosis to TYPE 2 DIABETES MELLITUS today

## 2018-04-10 NOTE — Assessment & Plan Note (Signed)
Check kidneys and liver

## 2018-04-10 NOTE — Patient Instructions (Addendum)
If you need something for aches or pains, try to use Tylenol (acetaminophen) instead of non-steroidals (which include Aleve, ibuprofen, Advil, Motrin, and naproxen); non-steroidals can cause long-term kidney damage and raise your blood pressure Try to use PLAIN allergy medicine without the decongestant Avoid: phenylephrine, phenylpropanolamine, and pseudoephredine We'll have you see the diabetic educator soon Have your eyes examined yearly

## 2018-04-10 NOTE — Assessment & Plan Note (Signed)
Excellent today; avoid NSAIDs, decongestants

## 2018-04-11 LAB — LIPID PANEL
Cholesterol: 124 mg/dL (ref <200)
HDL: 41 mg/dL (ref >40)
LDL Cholesterol (Calc): 64 mg/dL (calc)
Non-HDL Cholesterol (Calc): 83 mg/dL (calc) (ref <130)
TRIGLYCERIDES: 100 mg/dL (ref <150)
Total CHOL/HDL Ratio: 3 (calc) (ref <5.0)

## 2018-04-11 LAB — HEMOGLOBIN A1C
EAG (MMOL/L): 7.7 (calc)
HEMOGLOBIN A1C: 6.5 %{Hb} — AB (ref <5.7)
MEAN PLASMA GLUCOSE: 140 (calc)

## 2018-04-11 LAB — MICROALBUMIN / CREATININE URINE RATIO
Creatinine, Urine: 203 mg/dL (ref 20–320)
MICROALB/CREAT RATIO: 40 ug/mg{creat} — AB (ref <30)
Microalb, Ur: 8.1 mg/dL

## 2018-04-12 ENCOUNTER — Encounter: Payer: Self-pay | Admitting: Family Medicine

## 2018-04-12 DIAGNOSIS — R809 Proteinuria, unspecified: Secondary | ICD-10-CM

## 2018-04-12 DIAGNOSIS — E1129 Type 2 diabetes mellitus with other diabetic kidney complication: Secondary | ICD-10-CM | POA: Insufficient documentation

## 2018-04-13 ENCOUNTER — Inpatient Hospital Stay: Payer: Medicare Other

## 2018-04-13 ENCOUNTER — Inpatient Hospital Stay: Payer: Medicare Other | Attending: Oncology

## 2018-04-13 DIAGNOSIS — Z79899 Other long term (current) drug therapy: Secondary | ICD-10-CM | POA: Diagnosis not present

## 2018-04-13 DIAGNOSIS — C7B02 Secondary carcinoid tumors of liver: Secondary | ICD-10-CM | POA: Insufficient documentation

## 2018-04-13 DIAGNOSIS — C7A8 Other malignant neuroendocrine tumors: Secondary | ICD-10-CM | POA: Diagnosis not present

## 2018-04-13 LAB — BASIC METABOLIC PANEL
ANION GAP: 8 (ref 5–15)
BUN: 19 mg/dL (ref 8–23)
CALCIUM: 9.5 mg/dL (ref 8.9–10.3)
CHLORIDE: 105 mmol/L (ref 98–111)
CO2: 29 mmol/L (ref 22–32)
Creatinine, Ser: 0.86 mg/dL (ref 0.61–1.24)
GFR calc Af Amer: >60 mL/min (ref >60)
GFR calc non Af Amer: >60 mL/min (ref >60)
Glucose, Bld: 131 mg/dL — ABNORMAL HIGH (ref 70–99)
Potassium: 4.2 mmol/L (ref 3.5–5.1)
SODIUM: 142 mmol/L (ref 135–145)

## 2018-04-13 MED ORDER — LANREOTIDE ACETATE 120 MG/0.5ML ~~LOC~~ SOLN
120.0000 mg | Freq: Once | SUBCUTANEOUS | Status: AC
  Administered 2018-04-13: 120 mg via SUBCUTANEOUS
  Filled 2018-04-13: qty 120

## 2018-04-18 ENCOUNTER — Other Ambulatory Visit: Payer: Self-pay | Admitting: Family Medicine

## 2018-04-24 ENCOUNTER — Encounter: Payer: Self-pay | Admitting: Dietician

## 2018-04-24 ENCOUNTER — Telehealth: Payer: Self-pay | Admitting: Family Medicine

## 2018-04-24 ENCOUNTER — Encounter: Payer: Medicare Other | Attending: Family Medicine | Admitting: Dietician

## 2018-04-24 VITALS — BP 134/66 | Ht 64.0 in | Wt 161.5 lb

## 2018-04-24 DIAGNOSIS — Z6827 Body mass index (BMI) 27.0-27.9, adult: Secondary | ICD-10-CM | POA: Insufficient documentation

## 2018-04-24 DIAGNOSIS — Z713 Dietary counseling and surveillance: Secondary | ICD-10-CM | POA: Insufficient documentation

## 2018-04-24 DIAGNOSIS — E119 Type 2 diabetes mellitus without complications: Secondary | ICD-10-CM | POA: Diagnosis not present

## 2018-04-24 NOTE — Progress Notes (Signed)
Diabetes Self-Management Education  Visit Type: First/Initial  Appt. Start Time: 1325 Appt. End Time: 1440  04/24/2018  Mr. Jason Malone, identified by name and date of birth, is a 77 y.o. male with a diagnosis of Diabetes: Type 2.   ASSESSMENT  Blood pressure 134/66, height 5\' 4"  (1.626 m), weight 161 lb 8 oz (73.3 kg). Body mass index is 27.72 kg/m.  Diabetes Self-Management Education - 04/24/18 1517      Visit Information   Visit Type  First/Initial      Initial Visit   Diabetes Type  Type 2      Health Coping   How would you rate your overall health?  Good      Psychosocial Assessment   Patient Belief/Attitude about Diabetes  Motivated to manage diabetes    Self-care barriers  None    Self-management support  Family;Doctor's office    Other persons present  Patient;Spouse/SO    Patient Concerns  Glycemic Control    Special Needs  None    Preferred Learning Style  Hands on;Visual;Auditory    Learning Readiness  Ready      Pre-Education Assessment   Patient understands the diabetes disease and treatment process.  Needs Instruction    Patient understands incorporating nutritional management into lifestyle.  Needs Instruction    Patient undertands incorporating physical activity into lifestyle.  Needs Instruction    Patient understands using medications safely.  Needs Instruction    Patient understands monitoring blood glucose, interpreting and using results  Needs Instruction    Patient understands prevention, detection, and treatment of acute complications.  Needs Instruction    Patient understands prevention, detection, and treatment of chronic complications.  Needs Instruction    Patient understands how to develop strategies to address psychosocial issues.  Needs Instruction    Patient understands how to develop strategies to promote health/change behavior.  Needs Instruction      Complications   Last HgB A1C per patient/outside source  6.5 %   04-10-18   How  often do you check your blood sugar?  0 times/day (not testing)    Have you had a dilated eye exam in the past 12 months?  Yes   about 1 year ago   Have you had a dental exam in the past 12 months?  No   has upper and lower dentures   Are you checking your feet?  No      Dietary Intake   Breakfast  skips breakfast    Snack (morning)  none    Lunch  eats lunch at 12p-1p=usually eats salads with fried chicken strips    Snack (afternoon)  skips    Dinner  eats supper at [employer] (evening)  eats occasional snack at 9p=crackers or cereal and milk    Beverage(s)  drinks diet soda 2-3x/day, water 2-3x/day and occasional milk with cereal      Exercise   Exercise Type  Light (walking / raking leaves)    How many days per week to you exercise?  7    How many minutes per day do you exercise?  40    Total minutes per week of exercise  280      Patient Education   Previous Diabetes Education  No    Disease state   Definition of diabetes, type 1 and 2, and the diagnosis of diabetes;Explored patient's options for treatment of their diabetes    Nutrition management   Role of diet in  the treatment of diabetes and the relationship between the three main macronutrients and blood glucose level;Food label reading, portion sizes and measuring food.;Carbohydrate counting    Physical activity and exercise   Role of exercise on diabetes management, blood pressure control and cardiac health.;Helped patient identify appropriate exercises in relation to his/her diabetes, diabetes complications and other health issue.    Monitoring  Taught/evaluated SMBG meter.;Purpose and frequency of SMBG.;Taught/discussed recording of test results and interpretation of SMBG.;Identified appropriate SMBG and/or A1C goals.;Yearly dilated eye exam   gave pt One Touch Verio Flex meter and instructed on its use-BG 111 (2 hr pp)   Chronic complications  Relationship between chronic complications and blood glucose control;Retinopathy  and reason for yearly dilated eye exams    Personal strategies to promote health  Lifestyle issues that need to be addressed for better diabetes care;Helped patient develop diabetes management plan for (enter comment)      Outcomes   Expected Outcomes  Demonstrated interest in learning. Expect positive outcomes       Individualized Plan for Diabetes Self-Management Training:   Learning Objective:  Patient will have a greater understanding of diabetes self-management. Patient education plan is to attend individual and/or group sessions per assessed needs and concerns.   Plan:   Patient Instructions   Check blood sugars 2 x day before breakfast and 2 hrs after lunch or  supper every day and record  Bring blood sugar records to the next appointment/class  Call your doctor for a prescription for:  1. Meter strips (type): One Touch Verio test strips checking                  2x/day  2. Lancets (type):  One Touch Delica lancets checking  2x/day   Exercise: continue walking on treadmill 20 min 2x/day 7 days/wk  Eat 3 meals day +  1  snacks a day at bedtime  Space meals 4-6 hours apart  Eat 3 carbohydrate servings/meal + protein  Eat 1 carbohydrate serving/snack + protein  Avoid sugar sweetened drinks (soda, tea, coffee, sports drinks, juices)  Drink plenty of water  Limit intake of sweets, fried foods and snack foods  Make eye doctor appointment  Get a Sharps container  Return for appointment/classes on:  05-25-18   Expected Outcomes:  Demonstrated interest in learning. Expect positive outcomes  Education material provided: General meal guidelines, Food Group handout, One Touch Verio Flex meter  If problems or questions, patient to contact team via: [phone]  Future DSME appointment:  05-25-18

## 2018-04-24 NOTE — Telephone Encounter (Signed)
Before I enter all this info, does patient need to be checking sugars

## 2018-04-24 NOTE — Telephone Encounter (Signed)
Patient came in due to needing the following prescriptions to be sent to the [facility] in [place] on [address].  One Touch Verio Test Strips (checking 2x/day) One Touch Delcia Lancets (checking 2x/day)  Patient was at [place] and Diabetes Educations services and they informed him to come over to request the above prescriptions.

## 2018-04-24 NOTE — Patient Instructions (Signed)
  Check blood sugars 2 x day before breakfast and 2 hrs after lunch or  supper every day and record  Bring blood sugar records to the next appointment/class  Call your doctor for a prescription for:  1. Meter strips (type): One Touch Verio test strips checking                  2x/day  2. Lancets (type):  One Touch Delica lancets checking  2x/day   Exercise: continue walking on treadmill 20 min 2x/day 7 days/wk  Eat 3 meals day +  1  snacks a day at bedtime  Space meals 4-6 hours apart  Eat 3 carbohydrate servings/meal + protein  Eat 1 carbohydrate serving/snack + protein  Avoid sugar sweetened drinks (soda, tea, coffee, sports drinks, juices)  Drink plenty of water  Limit intake of sweets, fried foods and snack foods  Make eye doctor appointment  Get a Sharps container  Return for appointment/classes on:  05-25-18

## 2018-04-24 NOTE — Telephone Encounter (Signed)
Thank you for checking first Yes, he just met with the diabetic educator, so this is all okay Dx: E11.9

## 2018-04-25 MED ORDER — ONETOUCH DELICA LANCETS 33G MISC: 3 refills | Status: DC

## 2018-04-25 MED ORDER — GLUCOSE BLOOD VI STRP: ORAL_STRIP | 3 refills | Status: DC

## 2018-04-25 NOTE — Telephone Encounter (Signed)
I can never find? Can you please order because I can not find particular strips and lancets they are asking for?

## 2018-04-25 NOTE — Telephone Encounter (Signed)
Prescriptions entered and sent

## 2018-04-27 ENCOUNTER — Other Ambulatory Visit: Payer: Self-pay | Admitting: Student

## 2018-04-28 ENCOUNTER — Ambulatory Visit (HOSPITAL_COMMUNITY)
Admission: RE | Admit: 2018-04-28 | Discharge: 2018-04-28 | Disposition: A | Discharge status: Home / Self Care | Payer: Medicare Other | Source: Ambulatory Visit | Attending: Interventional Radiology | Admitting: Interventional Radiology

## 2018-04-28 ENCOUNTER — Other Ambulatory Visit (HOSPITAL_COMMUNITY): Payer: Self-pay | Admitting: Interventional Radiology

## 2018-04-28 ENCOUNTER — Encounter (HOSPITAL_COMMUNITY)
Admission: RE | Admit: 2018-04-28 | Discharge: 2018-04-28 | Disposition: A | Discharge status: Home / Self Care | Payer: Medicare Other | Source: Ambulatory Visit | Attending: Interventional Radiology | Admitting: Interventional Radiology

## 2018-04-28 ENCOUNTER — Encounter (HOSPITAL_COMMUNITY): Payer: Self-pay

## 2018-04-28 ENCOUNTER — Other Ambulatory Visit: Payer: Self-pay

## 2018-04-28 DIAGNOSIS — Z79899 Other long term (current) drug therapy: Secondary | ICD-10-CM | POA: Diagnosis not present

## 2018-04-28 DIAGNOSIS — C787 Secondary malignant neoplasm of liver and intrahepatic bile duct: Secondary | ICD-10-CM

## 2018-04-28 DIAGNOSIS — Z800 Family history of malignant neoplasm of digestive organs: Secondary | ICD-10-CM | POA: Insufficient documentation

## 2018-04-28 DIAGNOSIS — E785 Hyperlipidemia, unspecified: Secondary | ICD-10-CM | POA: Diagnosis not present

## 2018-04-28 DIAGNOSIS — I4892 Unspecified atrial flutter: Secondary | ICD-10-CM | POA: Insufficient documentation

## 2018-04-28 DIAGNOSIS — D3A8 Other benign neuroendocrine tumors: Secondary | ICD-10-CM

## 2018-04-28 DIAGNOSIS — C7B02 Secondary carcinoid tumors of liver: Secondary | ICD-10-CM | POA: Diagnosis not present

## 2018-04-28 DIAGNOSIS — Z888 Allergy status to other drugs, medicaments and biological substances status: Secondary | ICD-10-CM | POA: Diagnosis not present

## 2018-04-28 DIAGNOSIS — I10 Essential (primary) hypertension: Secondary | ICD-10-CM | POA: Diagnosis not present

## 2018-04-28 DIAGNOSIS — Z823 Family history of stroke: Secondary | ICD-10-CM | POA: Diagnosis not present

## 2018-04-28 DIAGNOSIS — Z801 Family history of malignant neoplasm of trachea, bronchus and lung: Secondary | ICD-10-CM | POA: Insufficient documentation

## 2018-04-28 DIAGNOSIS — Z8249 Family history of ischemic heart disease and other diseases of the circulatory system: Secondary | ICD-10-CM | POA: Diagnosis not present

## 2018-04-28 DIAGNOSIS — Z87891 Personal history of nicotine dependence: Secondary | ICD-10-CM | POA: Insufficient documentation

## 2018-04-28 DIAGNOSIS — M8580 Other specified disorders of bone density and structure, unspecified site: Secondary | ICD-10-CM | POA: Diagnosis not present

## 2018-04-28 DIAGNOSIS — C220 Liver cell carcinoma: Secondary | ICD-10-CM | POA: Diagnosis not present

## 2018-04-28 DIAGNOSIS — C7A1 Malignant poorly differentiated neuroendocrine tumors: Secondary | ICD-10-CM | POA: Diagnosis not present

## 2018-04-28 HISTORY — PX: IR ANGIOGRAM VISCERAL SELECTIVE: IMG657

## 2018-04-28 HISTORY — PX: IR ANGIOGRAM SELECTIVE EACH ADDITIONAL VESSEL: IMG667

## 2018-04-28 HISTORY — PX: IR US GUIDE VASC ACCESS RIGHT: IMG2390

## 2018-04-28 HISTORY — PX: IR EMBO TUMOR ORGAN ISCHEMIA INFARCT INC GUIDE ROADMAPPING: IMG5449

## 2018-04-28 LAB — CBC WITH DIFFERENTIAL/PLATELET
Abs Immature Granulocytes: 0.02 10*3/uL (ref 0.00–0.07)
BASOS PCT: 1 %
Basophils Absolute: 0.1 10*3/uL (ref 0.0–0.1)
Eosinophils Absolute: 0.2 10*3/uL (ref 0.0–0.5)
Eosinophils Relative: 3 %
HCT: 47.6 % (ref 39.0–52.0)
Hemoglobin: 15.3 g/dL (ref 13.0–17.0)
Immature Granulocytes: 0 %
Lymphocytes Relative: 9 %
Lymphs Abs: 0.8 10*3/uL (ref 0.7–4.0)
MCH: 29.9 pg (ref 26.0–34.0)
MCHC: 32.1 g/dL (ref 30.0–36.0)
MCV: 93 fL (ref 80.0–100.0)
Monocytes Absolute: 1.3 10*3/uL — ABNORMAL HIGH (ref 0.1–1.0)
Monocytes Relative: 14 %
Neutro Abs: 6.6 10*3/uL (ref 1.7–7.7)
Neutrophils Relative %: 73 %
PLATELETS: 174 10*3/uL (ref 150–400)
RBC: 5.12 MIL/uL (ref 4.22–5.81)
RDW: 13.5 % (ref 11.5–15.5)
WBC: 9.1 10*3/uL (ref 4.0–10.5)
nRBC: 0 % (ref 0.0–0.2)

## 2018-04-28 LAB — COMPREHENSIVE METABOLIC PANEL
ALT: 25 U/L (ref 0–44)
AST: 27 U/L (ref 15–41)
Albumin: 4.3 g/dL (ref 3.5–5.0)
Alkaline Phosphatase: 103 U/L (ref 38–126)
Anion gap: 11 (ref 5–15)
BUN: 18 mg/dL (ref 8–23)
CO2: 24 mmol/L (ref 22–32)
Calcium: 9.6 mg/dL (ref 8.9–10.3)
Chloride: 105 mmol/L (ref 98–111)
Creatinine, Ser: 0.95 mg/dL (ref 0.61–1.24)
GFR calc Af Amer: >60 mL/min (ref >60)
GFR calc non Af Amer: >60 mL/min (ref >60)
Glucose, Bld: 138 mg/dL — ABNORMAL HIGH (ref 70–99)
Potassium: 4.2 mmol/L (ref 3.5–5.1)
Sodium: 140 mmol/L (ref 135–145)
Total Bilirubin: 0.9 mg/dL (ref 0.3–1.2)
Total Protein: 7 g/dL (ref 6.5–8.1)

## 2018-04-28 LAB — PROTIME-INR
INR: 0.91
Prothrombin Time: 12.1 seconds (ref 11.4–15.2)

## 2018-04-28 MED ORDER — LIDOCAINE HCL 1 % IJ SOLN
INTRAMUSCULAR | Status: AC
  Filled 2018-04-28: qty 20

## 2018-04-28 MED ORDER — MIDAZOLAM HCL 2 MG/2ML IJ SOLN
INTRAMUSCULAR | Status: AC | PRN
  Administered 2018-04-28 (×2): 1 mg via INTRAVENOUS
  Administered 2018-04-28 (×2): 0.5 mg via INTRAVENOUS

## 2018-04-28 MED ORDER — YTTRIUM 90 INJECTION: 29.4000 | INJECTION | Freq: Once | INTRAVENOUS | Status: DC

## 2018-04-28 MED ORDER — PIPERACILLIN-TAZOBACTAM 3.375 G IVPB 30 MIN
3.3750 g | Freq: Once | INTRAVENOUS | Status: AC
  Administered 2018-04-28: 3.375 g via INTRAVENOUS
  Filled 2018-04-28: qty 50

## 2018-04-28 MED ORDER — DEXAMETHASONE SODIUM PHOSPHATE 4 MG/ML IJ SOLN
INTRAMUSCULAR | Status: AC
  Administered 2018-04-28: 4 mg via INTRAVENOUS
  Filled 2018-04-28: qty 1

## 2018-04-28 MED ORDER — SODIUM CHLORIDE 0.9 % IV SOLN
8.0000 mg | Freq: Once | INTRAVENOUS | Status: AC
  Administered 2018-04-28: 8 mg via INTRAVENOUS
  Filled 2018-04-28: qty 4

## 2018-04-28 MED ORDER — OCTREOTIDE ACETATE 100 MCG/ML IJ SOLN
200.0000 ug | Freq: Once | INTRAMUSCULAR | Status: AC
  Administered 2018-04-28: 200 ug via SUBCUTANEOUS
  Filled 2018-04-28: qty 0.2

## 2018-04-28 MED ORDER — FENTANYL CITRATE (PF) 100 MCG/2ML IJ SOLN
INTRAMUSCULAR | Status: AC | PRN
  Administered 2018-04-28 (×2): 50 ug via INTRAVENOUS

## 2018-04-28 MED ORDER — IOHEXOL 300 MG/ML  SOLN
100.0000 mL | Freq: Once | INTRAMUSCULAR | Status: AC | PRN
  Administered 2018-04-28: 50 mL via INTRAVENOUS

## 2018-04-28 MED ORDER — DEXAMETHASONE SODIUM PHOSPHATE 4 MG/ML IJ SOLN
4.0000 mg | Freq: Once | INTRAMUSCULAR | Status: AC
  Administered 2018-04-28: 4 mg via INTRAVENOUS
  Filled 2018-04-28: qty 1

## 2018-04-28 MED ORDER — FENTANYL CITRATE (PF) 100 MCG/2ML IJ SOLN
INTRAMUSCULAR | Status: AC
  Filled 2018-04-28: qty 2

## 2018-04-28 MED ORDER — IOHEXOL 300 MG/ML  SOLN
100.0000 mL | Freq: Once | INTRAMUSCULAR | Status: AC | PRN
  Administered 2018-04-28: 15 mL via INTRAVENOUS

## 2018-04-28 MED ORDER — DEXAMETHASONE SODIUM PHOSPHATE 10 MG/ML IJ SOLN: 20.0000 mg | Freq: Once | INTRAMUSCULAR | Status: DC

## 2018-04-28 MED ORDER — MIDAZOLAM HCL 2 MG/2ML IJ SOLN
INTRAMUSCULAR | Status: AC
  Filled 2018-04-28: qty 2

## 2018-04-28 MED ORDER — PANTOPRAZOLE SODIUM 40 MG IV SOLR
40.0000 mg | Freq: Once | INTRAVENOUS | Status: AC
  Administered 2018-04-28: 40 mg via INTRAVENOUS
  Filled 2018-04-28: qty 40

## 2018-04-28 MED ORDER — IOHEXOL 300 MG/ML  SOLN
100.0000 mL | Freq: Once | INTRAMUSCULAR | Status: AC | PRN
  Administered 2018-04-28: 37 mL via INTRA_ARTERIAL

## 2018-04-28 MED ORDER — SODIUM CHLORIDE 0.9 % IV SOLN
INTRAVENOUS | Status: DC
  Administered 2018-04-28: 08:00:00 via INTRAVENOUS

## 2018-04-28 MED ORDER — PIPERACILLIN-TAZOBACTAM 3.375 G IVPB
INTRAVENOUS | Status: AC
  Filled 2018-04-28: qty 50

## 2018-04-28 NOTE — Progress Notes (Signed)
Patient reporting being given prescription for reflux pill after last procedure that he took for 4 or 5 days before running out. He states he did not have reflux symptoms last time but would like to have more reflux pills so he does not experience any reflux.  Unable to determine from chart review what rx was given to patient at last procedure, he did have Protonix at some point but patient denies taking this anymore. He reports he doesn't like to take pills unless he has to so would only like a few days of medication.   Patient given rx for Zantac 75 mg tablet 1 tab PO QD x 7 days with instructions to call IR if he experiences further symptoms.  Candiss Norse, PA-C

## 2018-04-28 NOTE — Procedures (Signed)
Met neuroendocrine tumor to the liver  S/p RT HEPATIC Y90 RADIOEMBO  NO COMP STABLE EBL MIN FULL REPORT IN PACS

## 2018-04-28 NOTE — H&P (Signed)
Chief Complaint: Patient was seen in consultation today for Y90 hepatic radioembolization  Supervising Physician: Daryll Brod  Patient Status: [facility] - Out-pt  History of Present Illness: Jason Malone is a 77 y.o. male with a past medical history significant for HTN, HLD, paroxysmal a.fib, osteopenia and stage IVa metastatic neuroendocrine tumor to the liver. He is well known to IR due to previous Y90 radioembolization of the left hepatic lobe on 08/11/17 by Dr. Annamaria Boots. He was most recently seen in follow up at IR clinic on 03/22/18 with Dr. Annamaria Boots where follow-up MRI was discussed which showed interval reduction in the size of the dominant left hepatic metastasis however two enlarging small right hepatic metastases were noted - decision was made at that time to pursue further right hepatic Y 90 radioembolization for which he presents today.  Patient denies any complaints today, he states he did well the last time he had this procedure done - very minimal pain. He states he is hungry and he left his dentures at home in a cup. He states understanding of procedure and wishes to proceed.  Past Medical History:  Diagnosis Date  . Allergic rhinitis   . Benign prostatic hypertrophy with lower urinary tract symptoms (LUTS)   . Chest pain    a. 05/2013 MV: EF 70%, no ischemia.  . Diverticulitis    DIVERTICULOSIS  . Enlarged prostate   . Essential hypertension    CONTROLLED ON MEDS  . Full dentures    upper and lower  . H/O hypokalemia   . Hx of atrial fibrillation without current medication    CARDIOLOGIST-DR [facility]  . 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 Hyperlipemia   . Hypomagnesemia   . IFG (impaired fasting glucose)   . Liver cancer ([place])   . Liver cancer ([place]) 12/08/2016  . Liver mass, left lobe 08/28/2016   Probably hemangioma; will get MRI of liver, refer to GI  . Lung cancer ([place])    a. carcinoid, left lung, Stage 1b (T2a, N0, cM0);  b. 05/2013  s/p VATS & LULobectomy.  . Monocytosis   . Osteopenia   . Paroxysmal atrial flutter ([facility])    a. 03/2013->no recurrence;  b. CHA2DS2VASc = 2-->not currently on anticoagulation;  c. 05/2012 Echo: EF 55-60%, normal RV.  [name] Wears glasses     Past Surgical History:  Procedure Laterality Date  . BRONCHOSCOPY     04/06/2013  . CARDIOVASCULAR STRESS TEST  05/2013   a. No evidence of ischemia or infarct, EF 70%, no WMAs  . CATARACT EXTRACTION W/PHACO Left 07/14/2015   Procedure: CATARACT EXTRACTION PHACO AND INTRAOCULAR LENS PLACEMENT (IOC);  Surgeon: [name], MD;  Location: [place];  Service: Ophthalmology;  Laterality: Left;  . CATARACT EXTRACTION W/PHACO Right 10/01/2015   Procedure: CATARACT EXTRACTION PHACO AND INTRAOCULAR LENS PLACEMENT (IOC);  Surgeon: [name], MD;  Location: [place];  Service: Ophthalmology;  Laterality: Right;  . COLONOSCOPY W/ POLYPECTOMY     bleed after-had to go to surgery to stop bleeding via colonoscopy  . COLONOSCOPY WITH PROPOFOL N/A 11/19/2015   Procedure: COLONOSCOPY WITH PROPOFOL;  Surgeon: [name], MD;  Location: [facility] ENDOSCOPY;  Service: Endoscopy;  Laterality: N/A;  . DUPUYTREN CONTRACTURE RELEASE  12/15/2011   Procedure: DUPUYTREN CONTRACTURE RELEASE;  Surgeon: [name], MD;  Location: [place];  Service: Orthopedics;  Laterality: Left;  Fasciotomy left ring finger dupuytrens  . IR ANGIOGRAM SELECTIVE  EACH ADDITIONAL VESSEL  07/26/2017  . IR ANGIOGRAM SELECTIVE EACH ADDITIONAL VESSEL  07/26/2017  . IR ANGIOGRAM SELECTIVE EACH ADDITIONAL VESSEL  07/26/2017  . IR ANGIOGRAM SELECTIVE EACH ADDITIONAL VESSEL  07/26/2017  . IR ANGIOGRAM SELECTIVE EACH ADDITIONAL VESSEL  07/26/2017  . IR ANGIOGRAM SELECTIVE EACH ADDITIONAL VESSEL  08/11/2017  . IR ANGIOGRAM VISCERAL SELECTIVE  07/26/2017  . IR ANGIOGRAM VISCERAL SELECTIVE  08/11/2017  . IR EMBO ARTERIAL NOT HEMORR HEMANG INC GUIDE ROADMAPPING   07/26/2017  . IR EMBO TUMOR ORGAN ISCHEMIA INFARCT INC GUIDE ROADMAPPING  08/11/2017  . IR RADIOLOGIST EVAL & MGMT  07/07/2017  . IR RADIOLOGIST EVAL & MGMT  09/07/2017  . IR RADIOLOGIST EVAL & MGMT  12/21/2017  . IR RADIOLOGIST EVAL & MGMT  03/22/2018  . IR US GUIDE VASC ACCESS RIGHT  07/26/2017  . IR US GUIDE VASC ACCESS RIGHT  08/11/2017  . LUNG LOBECTOMY  06/10/13   upper left lung  . MULTIPLE TOOTH EXTRACTIONS    . REMOVAL RETAINED LENS Right 11/17/2015   Procedure: REMOVAL RETAINED LENS FROAGMENTS RIGHT EYE;  Surgeon: [name], MD;  Location: [place];  Service: Ophthalmology;  Laterality: Right;  . TONSILLECTOMY    . UPPER GASTROINTESTINAL ENDOSCOPY  11-03-15   Dr [name]  . VASECTOMY    . VIDEO ASSISTED THORACOSCOPY (VATS)/WEDGE RESECTION Left 06/04/2013   Procedure: VIDEO ASSISTED THORACOSCOPY (VATS)/WEDGE RESECTION;  Surgeon: [name], MD;  Location: [place];  Service: Thoracic;  Laterality: Left;  [name] VIDEO BRONCHOSCOPY N/A 06/04/2013   Procedure: VIDEO BRONCHOSCOPY;  Surgeon: [name], MD;  Location: [facility] OR;  Service: Thoracic;  Laterality: N/A;    Allergies: Latex and Tape  Medications: Prior to Admission medications   Medication Sig Start Date End Date Taking? Authorizing Provider  amLODipine (NORVASC) 2.5 MG tablet TAKE 1 TABLET BY MOUTH ONCE DAILY 12/30/17   Minna Merritts, MD  atorvastatin (LIPITOR) 40 MG tablet TAKE 1 TABLET BY MOUTH ONCE DAILY FOR CHOLESTEROL 02/17/18   Lada, Satira Anis, MD  b complex vitamins capsule Take 1 capsule by mouth daily. AM    [provider]  Calcium Carbonate-Vitamin D3 (CALCIUM 600/VITAMIN D) 600-400 MG-UNIT TABS Take by mouth daily.    [provider]  carvedilol (COREG) 3.125 MG tablet TAKE 1 TABLET BY MOUTH ONCE DAILY IN THE MORNING 02/16/18   Gollan, Kathlene November, MD  Cinnamon 500 MG capsule Take 500 mg by mouth daily. AM AND BEDTIME    [provider]  fexofenadine (ALLEGRA) 180 MG  tablet Take 180 mg by mouth daily.    [provider]  finasteride (PROSCAR) 5 MG tablet TAKE 1 TABLET BY MOUTH ONCE DAILY 10/11/17   Lada, Satira Anis, MD  glucose blood (ONETOUCH VERIO) test strip Check fingerstick blood sugars twice a day; LON 99 months; Dx E11.9 04/25/18   Lada, Satira Anis, MD  LANREOTIDE ACETATE [place] Inject 1 Dose into the skin every 30 (thirty) days.    [provider]  losartan (COZAAR) 100 MG tablet TAKE 1 TABLET BY MOUTH ONCE DAILY 12/19/17   Minna Merritts, MD  Multiple Vitamins-Minerals (MULTIVITAMIN WITH MINERALS) tablet Take 1 tablet by mouth daily. AM    [provider]  Jonetta Speak LANCETS 01V MISC Check fingerstick blood sugars twice a day; LON 99 months; Dx E11.9 04/25/18   Arnetha Courser, MD  polycarbophil (FIBERCON) 625 MG tablet Take 625 mg by mouth daily. AM    [provider]  polyethylene glycol (MIRALAX / GLYCOLAX) packet Take 17 g by mouth daily as needed.     [provider]  tamsulosin (FLOMAX) 0.4 MG CAPS capsule TAKE 1 CAPSULE BY MOUTH ONCE DAILY 04/18/18   Lada, Satira Anis, MD  vitamin C (ASCORBIC ACID) 500 MG tablet Take 500 mg by mouth daily. AM    [provider]     Family History  Problem Relation Age of Onset  . Stroke Mother   . Alzheimer's disease Mother   . Hypertension Sister   . Hyperlipidemia Sister   . Hypertension Brother   . Hyperlipidemia Brother   . Diabetes Brother   . Cancer Sister        lung and colon    Social History   Socioeconomic History  . Marital status: Married    Spouse name: Not on file  . Number of children: Not on file  . Years of education: Not on file  . Highest education level: Not on file  Occupational History  . Not on file  Social Needs  . Financial resource strain: Not on file  . Food insecurity:    Worry: Not on file    Inability: Not on file  . Transportation needs:    Medical: Not on file    Non-medical: Not on file  Tobacco Use  .  Smoking status: Former Smoker    Packs/day: 1.00    Years: 40.00    Pack years: 40.00    Types: Cigarettes    Last attempt to quit: 12/09/1998    Years since quitting: 19.3  . Smokeless tobacco: Never Used  Substance and Sexual Activity  . Alcohol use: No  . Drug use: No  . Sexual activity: Yes  Lifestyle  . Physical activity:    Days per week: Not on file    Minutes per session: Not on file  . Stress: Not on file  Relationships  . Social connections:    Talks on phone: Not on file    Gets together: Not on file    Attends religious service: Not on file    Active member of club or organization: Not on file    Attends meetings of clubs or organizations: Not on file    Relationship status: Not on file  Other Topics Concern  . Not on file  Social History Narrative  . Not on file     Review of Systems: A 12 point ROS discussed and pertinent positives are indicated in the HPI above.  All other systems are negative.  Review of Systems  Constitutional: Negative for appetite change, chills, fatigue, fever and unexpected weight change.  Respiratory: Negative for cough and shortness of breath.   Cardiovascular: Negative for chest pain.  Gastrointestinal: Negative for abdominal distention, abdominal pain, diarrhea, nausea and vomiting.  Genitourinary: Negative for dysuria and hematuria.  Neurological: Negative for dizziness, syncope and headaches.  Psychiatric/Behavioral: Negative for confusion.    Vital Signs: Ht 5\' 4"  (1.626 m)   Wt 154 lb (69.9 kg)   BMI 26.43 kg/m   Physical Exam Vitals signs reviewed.  Constitutional:      General: He is not in acute distress.    Appearance: Normal appearance. He is not diaphoretic.  HENT:     Head: Normocephalic and atraumatic.  Eyes:     General: No scleral icterus. Cardiovascular:     Rate and Rhythm: Normal rate and regular rhythm.  Pulmonary:     Effort: Pulmonary effort is  normal.     Breath sounds: Normal breath sounds.    Abdominal:     General: Bowel sounds are normal. There is no distension.     Palpations: Abdomen is soft.     Tenderness: There is no abdominal tenderness.  Skin:    General: Skin is warm and dry.     Coloration: Skin is not jaundiced.  Neurological:     Mental Status: He is alert and oriented to person, place, and time.  Psychiatric:        Mood and Affect: Mood normal.        Behavior: Behavior normal.        Thought Content: Thought content normal.        Judgment: Judgment normal.      MD Evaluation Airway: WNL(edentulous) Heart: WNL Abdomen: WNL Chest/ Lungs: WNL ASA  Classification: 3 Mallampati/Airway Score: One   Imaging: No results found.  Labs:  CBC: Recent Labs    01/27/18 1432 02/16/18 1031 03/16/18 1033 04/28/18 0750  WBC 9.0 9.0 8.1 9.1  HGB 15.4 14.5 14.7 15.3  HCT 45.2 44.8 45.0 47.6  PLT 164 176 166 174    COAGS: Recent Labs    07/26/17 0748 08/11/17 0738 04/28/18 0750  INR 1.02 0.94 0.91    BMP: Recent Labs    01/27/18 1432 02/16/18 1031 03/16/18 1033 04/13/18 1055  NA 140 143 142 142  K 4.1 4.7 4.6 4.2  CL 105 108 106 105  CO2 25 27 29 29   GLUCOSE 130* 131* 130* 131*  BUN 17 20 15 19   CALCIUM 9.6 9.8 9.6 9.5  CREATININE 0.86 0.98 0.87 0.86  GFRNONAA >60 >60 >60 >60  GFRAA >60 >60 >60 >60    LIVER FUNCTION TESTS: Recent Labs    01/19/18 1118 01/27/18 1809 02/16/18 1031 03/16/18 1033  BILITOT 0.9 0.8 0.9 0.7  AST 35 30 24 22   ALT 37 30 33 25  ALKPHOS 122 132* 125 119  PROT 7.0 7.2 7.1 7.1  ALBUMIN 4.2 4.5 4.2 4.3    TUMOR MARKERS: Recent Labs    12/22/17 1025 01/19/18 1118 02/16/18 1031 03/16/18 1033  CHROMGRNA 4 4 5 5     Assessment and Plan:  Patient with stage IVa metastatic neuroendocrine tumor to the liver who is well known to IR due to previous left hepatic lobe Y90 radioembolization on 08/11/17 with Dr. Annamaria Boots. On most recent IR clinic follow up it two enlarging right hepatic lobe lesions were  noted and decision was made to proceed with further Y90 treatment for which he presents today.  Patient has been NPO since 6 pm yesterday, he took BP medication this morning with a few sips of water, he does not take blood thinning medications. Afebrile, WBC 9.1, hgb 15.3, plt 174, INR 0.91, CMP/CEA/Chromogranin A/AFP all pending at the time of this note writing.  Risks and benefits discussed with the patient including, but not limited to bleeding, infection, vascular injury, post procedural pain, nausea, vomiting and fatigue, contrast induced renal failure, liver failure, radiation injury to the bowel, radiation induced cholecystitis, neutropenia and possible need for additional procedures.  All of the patient's questions were answered, patient is agreeable to proceed. Consent signed and in chart.  Thank you for this interesting consult.  I greatly enjoyed meeting Darrian W Burkhalter and look forward to participating in their care.  A copy of this report was sent to the requesting provider on this date.  Electronically Signed: Joaquim Nam, PA-C 04/28/2018,  8:00 AM   I spent a total of  25 Minutes in face to face in clinical consultation, greater than 50% of which was counseling/coordinating care for Y90 radioembolization.

## 2018-04-28 NOTE — Discharge Instructions (Signed)
Moderate Conscious Sedation, Adult, Care After These instructions provide you with information about caring for yourself after your procedure. Your health care provider may also give you more specific instructions. Your treatment has been planned according to current medical practices, but problems sometimes occur. Call your health care provider if you have any problems or questions after your procedure. What can I expect after the procedure? After your procedure, it is common:  To feel sleepy for several hours.  To feel clumsy and have poor balance for several hours.  To have poor judgment for several hours.  To vomit if you eat too soon. Follow these instructions at home: For at least 24 hours after the procedure:   Do not: ? Participate in activities where you could fall or become injured. ? Drive. ? Use heavy machinery. ? Drink alcohol. ? Take sleeping pills or medicines that cause drowsiness. ? Make important decisions or sign legal documents. ? Take care of children on your own.  Rest. Eating and drinking  Follow the diet recommended by your health care provider.  If you vomit: ? Drink water, juice, or soup when you can drink without vomiting. ? Make sure you have little or no nausea before eating solid foods. General instructions  Have a responsible adult stay with you until you are awake and alert.  Take over-the-counter and prescription medicines only as told by your health care provider.  If you smoke, do not smoke without supervision.  Keep all follow-up visits as told by your health care provider. This is important. Contact a health care provider if:  You keep feeling nauseous or you keep vomiting.  You feel light-headed.  You develop a rash.  You have a fever. Get help right away if:  You have trouble breathing. This information is not intended to replace advice given to you by your health care provider. Make sure you discuss any questions you have  with your health care provider. Document Released: 02/14/2013 Document Revised: 09/29/2015 Document Reviewed: 08/16/2015 Elsevier Interactive Patient Education  2019 [place] Radioembolization Discharge Instructions  You have been given a radioactive material during your procedure.  While it is safe for you to be discharged home from the hospital, you need to proceed directly home.    Do not use public transportation, including air travel, lasting more than 2 hours for 1 week.  Avoid crowded public places for 1 week.  Adult visitors should try to avoid close contact with you for 1 week.    Children and pregnant females should not visit or have close contact with you for 1 week.  Items that you touch are not radioactive.  Do not sleep in the same bed as your partner for 1 week, and a condom should be used for sexual activity during the first 24 hours.  Your blood may be radioactive and caution should be used if any bleeding occurs during the recovery period.  Body fluids may be radioactive for 24 hours.  Wash your hands after voiding.  Men should sit to urinate.  Dispose of any soiled materials (flush down toilet or place in trash at home) during the first day.  Drink 6 to 8 glasses of fluids per day for 5 days to hydrate yourself.  If you need to see a doctor during the first week, you must let them know that you were treated with yttrium-90 microspheres, and will be slightly radioactive.  They can call Interventional Radiology [phone] with any questions.  Angiogram, Care After This sheet gives you information about how to care for yourself after your procedure. Your health care provider may also give you more specific instructions. If you have problems or questions, contact your health care provider. What can I expect after the procedure? After the procedure, it is common to have bruising and tenderness at the catheter insertion area. Follow these instructions at  home: Insertion site care  Follow instructions from your health care provider about how to take care of your insertion site. Make sure you: ? Wash your hands with soap and water before you change your bandage (dressing). If soap and water are not available, use hand sanitizer. ? Change your dressing as told by your health care provider. ? Leave stitches (sutures), skin glue, or adhesive strips in place. These skin closures may need to stay in place for 2 weeks or longer. If adhesive strip edges start to loosen and curl up, you may trim the loose edges. Do not remove adhesive strips completely unless your health care provider tells you to do that.  Do not take baths, swim, or use a hot tub until your health care provider approves.  You may shower 24-48 hours after the procedure or as told by your health care provider. ? Gently wash the site with plain soap and water. ? Pat the area dry with a clean towel. ? Do not rub the site. This may cause bleeding.  Do not apply powder or lotion to the site. Keep the site clean and dry.  Check your insertion site every day for signs of infection. Check for: ? Redness, swelling, or pain. ? Fluid or blood. ? Warmth. ? Pus or a bad smell. Activity  Rest as told by your health care provider, usually for 1-2 days.  Do not lift anything that is heavier than 10 lbs. (4.5 kg) or as told by your health care provider.  Do not drive for 24 hours if you were given a medicine to help you relax (sedative).  Do not drive or use heavy machinery while taking prescription pain medicine. General instructions   Return to your normal activities as told by your health care provider, usually in about a week. Ask your health care provider what activities are safe for you.  If the catheter site starts bleeding, lie flat and put pressure on the site. If the bleeding does not stop, get help right away. This is a medical emergency.  Drink enough fluid to keep your urine  clear or pale yellow. This helps flush the contrast dye from your body.  Take over-the-counter and prescription medicines only as told by your health care provider.  Keep all follow-up visits as told by your health care provider. This is important. Contact a health care provider if:  You have a fever or chills.  You have redness, swelling, or pain around your insertion site.  You have fluid or blood coming from your insertion site.  The insertion site feels warm to the touch.  You have pus or a bad smell coming from your insertion site.  You have bruising around the insertion site.  You notice blood collecting in the tissue around the catheter site (hematoma). The hematoma may be painful to the touch. Get help right away if:  You have severe pain at the catheter insertion area.  The catheter insertion area swells very fast.  The catheter insertion area is bleeding, and the bleeding does not stop when you hold steady pressure on the  area.  The area near or just beyond the catheter insertion site becomes pale, cool, tingly, or numb. These symptoms may represent a serious problem that is an emergency. Do not wait to see if the symptoms will go away. Get medical help right away. Call your local emergency services (911 in the [place] Do not drive yourself to the hospital. Summary  After the procedure, it is common to have bruising and tenderness at the catheter insertion area.  After the procedure, it is important to rest and drink plenty of fluids.  Do not take baths, swim, or use a hot tub until your health care provider says it is okay to do so. You may shower 24-48 hours after the procedure or as told by your health care provider.  If the catheter site starts bleeding, lie flat and put pressure on the site. If the bleeding does not stop, get help right away. This is a medical emergency. This information is not intended to replace advice given to you by your health care provider.  Make sure you discuss any questions you have with your health care provider. Document Released: 11/12/2004 Document Revised: 03/31/2016 Document Reviewed: 03/31/2016 Elsevier Interactive Patient Education  2019 [employer].

## 2018-04-29 LAB — AFP TUMOR MARKER: AFP, Serum, Tumor Marker: 4.1 ng/mL (ref 0.0–8.3)

## 2018-04-29 LAB — CHROMOGRANIN A: Chromogranin A: 4 nmol/L (ref 0–5)

## 2018-04-29 LAB — CEA: CEA: 4.1 ng/mL (ref 0.0–4.7)

## 2018-05-11 ENCOUNTER — Inpatient Hospital Stay: Payer: Medicare Other

## 2018-05-11 ENCOUNTER — Other Ambulatory Visit: Payer: Self-pay

## 2018-05-11 ENCOUNTER — Inpatient Hospital Stay: Payer: Medicare Other | Attending: Oncology

## 2018-05-11 DIAGNOSIS — C7A090 Malignant carcinoid tumor of the bronchus and lung: Secondary | ICD-10-CM | POA: Diagnosis not present

## 2018-05-11 DIAGNOSIS — C7B02 Secondary carcinoid tumors of liver: Secondary | ICD-10-CM | POA: Insufficient documentation

## 2018-05-11 DIAGNOSIS — Z902 Acquired absence of lung [part of]: Secondary | ICD-10-CM | POA: Insufficient documentation

## 2018-05-11 DIAGNOSIS — C7A8 Other malignant neuroendocrine tumors: Secondary | ICD-10-CM

## 2018-05-11 LAB — CBC WITH DIFFERENTIAL/PLATELET
Abs Immature Granulocytes: 0.03 10*3/uL (ref 0.00–0.07)
BASOS ABS: 0.1 10*3/uL (ref 0.0–0.1)
Basophils Relative: 1 %
Eosinophils Absolute: 0.2 10*3/uL (ref 0.0–0.5)
Eosinophils Relative: 2 %
HEMATOCRIT: 41.6 % (ref 39.0–52.0)
Hemoglobin: 13.7 g/dL (ref 13.0–17.0)
Immature Granulocytes: 0 %
Lymphocytes Relative: 5 %
Lymphs Abs: 0.5 10*3/uL — ABNORMAL LOW (ref 0.7–4.0)
MCH: 29.7 pg (ref 26.0–34.0)
MCHC: 32.9 g/dL (ref 30.0–36.0)
MCV: 90 fL (ref 80.0–100.0)
Monocytes Absolute: 1.6 10*3/uL — ABNORMAL HIGH (ref 0.1–1.0)
Monocytes Relative: 18 %
NEUTROS PCT: 74 %
NRBC: 0 % (ref 0.0–0.2)
Neutro Abs: 6.7 10*3/uL (ref 1.7–7.7)
Platelets: 226 10*3/uL (ref 150–400)
RBC: 4.62 MIL/uL (ref 4.22–5.81)
RDW: 13.5 % (ref 11.5–15.5)
WBC: 9 10*3/uL (ref 4.0–10.5)

## 2018-05-11 LAB — COMPREHENSIVE METABOLIC PANEL
ALT: 35 U/L (ref 0–44)
AST: 29 U/L (ref 15–41)
Albumin: 3.8 g/dL (ref 3.5–5.0)
Alkaline Phosphatase: 160 U/L — ABNORMAL HIGH (ref 38–126)
Anion gap: 9 (ref 5–15)
BUN: 17 mg/dL (ref 8–23)
CO2: 26 mmol/L (ref 22–32)
CREATININE: 0.89 mg/dL (ref 0.61–1.24)
Calcium: 9.6 mg/dL (ref 8.9–10.3)
Chloride: 105 mmol/L (ref 98–111)
GFR calc Af Amer: >60 mL/min (ref >60)
GFR calc non Af Amer: >60 mL/min (ref >60)
Glucose, Bld: 97 mg/dL (ref 70–99)
Potassium: 4.2 mmol/L (ref 3.5–5.1)
SODIUM: 140 mmol/L (ref 135–145)
Total Bilirubin: 0.7 mg/dL (ref 0.3–1.2)
Total Protein: 7 g/dL (ref 6.5–8.1)

## 2018-05-11 MED ORDER — LANREOTIDE ACETATE 120 MG/0.5ML ~~LOC~~ SOLN
120.0000 mg | Freq: Once | SUBCUTANEOUS | Status: AC
  Administered 2018-05-11: 120 mg via SUBCUTANEOUS
  Filled 2018-05-11: qty 120

## 2018-05-12 LAB — CHROMOGRANIN A: Chromogranin A: 5 nmol/L (ref 0–5)

## 2018-05-15 ENCOUNTER — Other Ambulatory Visit: Payer: Self-pay | Admitting: Interventional Radiology

## 2018-05-15 DIAGNOSIS — C7A8 Other malignant neuroendocrine tumors: Secondary | ICD-10-CM

## 2018-05-15 DIAGNOSIS — C7B8 Other secondary neuroendocrine tumors: Principal | ICD-10-CM

## 2018-05-22 ENCOUNTER — Encounter: Payer: Self-pay | Admitting: Dietician

## 2018-05-22 NOTE — Progress Notes (Signed)
Pt called and cancelled classes due to family issues-does not want to reschedule them

## 2018-05-23 ENCOUNTER — Encounter: Payer: Self-pay | Admitting: Dietician

## 2018-05-25 ENCOUNTER — Ambulatory Visit: Payer: Medicare Other

## 2018-06-01 ENCOUNTER — Ambulatory Visit: Payer: Medicare Other

## 2018-06-04 NOTE — Progress Notes (Signed)
Jason Malone  [phone] [phone]) [phone]  ID: Jason Malone OB: August 24, 1940  MR#: [id number]  XTK#:[id number]  Patient Care Team: Arnetha Courser, MD as PCP - General (Family Medicine) [name], Forest Gleason, MD (General Surgery) Nestor Lewandowsky, MD as Referring Physician (Cardiothoracic Surgery) Minna Merritts, MD as Consulting Physician (Cardiology) [name], MD as Consulting Physician (Cardiothoracic Surgery) Lloyd Huger, MD as Consulting Physician (Oncology) Sanda Klein Satira Anis, MD as Attending Physician (Family Medicine)  CHIEF COMPLAINT: 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  INTERVAL HISTORY: Patient returns to clinic today for further evaluation and continuation of lanreotide.  He recently underwent another Y 90 ablation to his liver.  He tolerated this procedure well.  He currently feels well and is asymptomatic. He has no neurologic complaints.  He denies any weakness or fatigue.  He has a good appetite and denies weight loss.  He has no chest pain or shortness of breath.  He denies any abdominal pain.  He has no nausea, vomiting, constipation, or diarrhea.  He has no urinary complaints.  Patient offers no specific complaints today.  REVIEW OF SYSTEMS:   Review of Systems  Constitutional: Negative.  Negative for fever, malaise/fatigue and weight loss.  Respiratory: Negative.  Negative for cough, hemoptysis and shortness of breath.   Cardiovascular: Negative.  Negative for chest pain and leg swelling.  Gastrointestinal: Negative.  Negative for abdominal pain, diarrhea, nausea and vomiting.  Genitourinary: Negative.  Negative for dysuria.  Musculoskeletal: Negative.  Negative for back pain.  Skin: Negative.  Negative for rash.  Neurological: Negative.  Negative for dizziness, sensory change, focal weakness and weakness.  Psychiatric/Behavioral: Negative.  The patient is not nervous/anxious.     As per  HPI. Otherwise, a complete review of systems is negative.  PAST MEDICAL HISTORY: Past Medical History:  Diagnosis Date  . Allergic rhinitis   . Benign prostatic hypertrophy with lower urinary tract symptoms (LUTS)   . Chest pain    a. 05/2013 MV: EF 70%, no ischemia.  . Diverticulitis    DIVERTICULOSIS  . Enlarged prostate   . Essential hypertension    CONTROLLED ON MEDS  . Full dentures    upper and lower  . H/O hypokalemia   . Hx of atrial fibrillation without current medication    CARDIOLOGIST-DR [facility]  . 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 Hyperlipemia   . Hypomagnesemia   . IFG (impaired fasting glucose)   . Liver cancer ([place])   . Liver cancer ([place]) 12/08/2016  . Liver mass, left lobe 08/28/2016   Probably hemangioma; will get MRI of liver, refer to GI  . Lung cancer ([place])    a. carcinoid, left lung, Stage 1b (T2a, N0, cM0);  b. 05/2013 s/p VATS & LULobectomy.  . Monocytosis   . Osteopenia   . Paroxysmal atrial flutter (HCC)    a. 03/2013->no recurrence;  b. CHA2DS2VASc = 2-->not currently on anticoagulation;  c. 05/2012 Echo: EF 55-60%, normal RV.  [name] Wears glasses     PAST SURGICAL HISTORY: Past Surgical History:  Procedure Laterality Date  . BRONCHOSCOPY     04/06/2013  . CARDIOVASCULAR STRESS TEST  05/2013   a. No evidence of ischemia or infarct, EF 70%, no WMAs  . CATARACT EXTRACTION W/PHACO Left 07/14/2015   Procedure: CATARACT EXTRACTION PHACO AND INTRAOCULAR LENS PLACEMENT (IOC);  Surgeon: [name], MD;  Location: [place];  Service:  Ophthalmology;  Laterality: Left;  . CATARACT EXTRACTION W/PHACO Right 10/01/2015   Procedure: CATARACT EXTRACTION PHACO AND INTRAOCULAR LENS PLACEMENT (IOC);  Surgeon: [name], MD;  Location: [place];  Service: Ophthalmology;  Laterality: Right;  . COLONOSCOPY W/ POLYPECTOMY     bleed after-had to go to surgery to stop bleeding via colonoscopy  .  COLONOSCOPY WITH PROPOFOL N/A 11/19/2015   Procedure: COLONOSCOPY WITH PROPOFOL;  Surgeon: [name], MD;  Location[facility];  Service: Endoscopy;  Laterality: N/A;  . DUPUYTREN CONTRACTURE RELEASE  12/15/2011   Procedure: DUPUYTREN CONTRACTURE RELEASE;  Surgeon: [name], MD;  Location: [place];  Service: Orthopedics;  Laterality: Left;  Fasciotomy left ring finger dupuytrens  . IR ANGIOGRAM SELECTIVE EACH ADDITIONAL VESSEL  07/26/2017  . IR ANGIOGRAM SELECTIVE EACH ADDITIONAL VESSEL  07/26/2017  . IR ANGIOGRAM SELECTIVE EACH ADDITIONAL VESSEL  07/26/2017  . IR ANGIOGRAM SELECTIVE EACH ADDITIONAL VESSEL  07/26/2017  . IR ANGIOGRAM SELECTIVE EACH ADDITIONAL VESSEL  07/26/2017  . IR ANGIOGRAM SELECTIVE EACH ADDITIONAL VESSEL  08/11/2017  . IR ANGIOGRAM SELECTIVE EACH ADDITIONAL VESSEL  04/28/2018  . IR ANGIOGRAM VISCERAL SELECTIVE  07/26/2017  . IR ANGIOGRAM VISCERAL SELECTIVE  08/11/2017  . IR ANGIOGRAM VISCERAL SELECTIVE  04/28/2018  . IR EMBO ARTERIAL NOT HEMORR HEMANG INC GUIDE ROADMAPPING  07/26/2017  . IR EMBO TUMOR ORGAN ISCHEMIA INFARCT INC GUIDE ROADMAPPING  08/11/2017  . IR EMBO TUMOR ORGAN ISCHEMIA INFARCT INC GUIDE ROADMAPPING  04/28/2018  . IR RADIOLOGIST EVAL & MGMT  07/07/2017  . IR RADIOLOGIST EVAL & MGMT  09/07/2017  . IR RADIOLOGIST EVAL & MGMT  12/21/2017  . IR RADIOLOGIST EVAL & MGMT  03/22/2018  . IR RADIOLOGIST EVAL & MGMT  06/08/2018  . IR US GUIDE VASC ACCESS RIGHT  07/26/2017  . IR US GUIDE VASC ACCESS RIGHT  08/11/2017  . IR US GUIDE VASC ACCESS RIGHT  04/28/2018  . LUNG LOBECTOMY  06/10/13   upper left lung  . MULTIPLE TOOTH EXTRACTIONS    . REMOVAL RETAINED LENS Right 11/17/2015   Procedure: REMOVAL RETAINED LENS FROAGMENTS RIGHT EYE;  Surgeon: [name], MD;  Location: [place];  Service: Ophthalmology;  Laterality: Right;  . TONSILLECTOMY    . UPPER GASTROINTESTINAL ENDOSCOPY  11-03-15   Dr [name]  . VASECTOMY    . VIDEO  ASSISTED THORACOSCOPY (VATS)/WEDGE RESECTION Left 06/04/2013   Procedure: VIDEO ASSISTED THORACOSCOPY (VATS)/WEDGE RESECTION;  Surgeon: [name], MD;  Location: [place];  Service: Thoracic;  Laterality: Left;  [name] VIDEO BRONCHOSCOPY N/A 06/04/2013   Procedure: VIDEO BRONCHOSCOPY;  Surgeon: [name], MD;  Location: [facility] OR;  Service: Thoracic;  Laterality: N/A;    FAMILY HISTORY: Family History  Problem Relation Age of Onset  . Stroke Mother   . Alzheimer's disease Mother   . Hypertension Sister   . Hyperlipidemia Sister   . Hypertension Brother   . Hyperlipidemia Brother   . Diabetes Brother   . Cancer Sister        lung and colon    ADVANCED DIRECTIVES (Y/N):  N  HEALTH MAINTENANCE: Social History   Tobacco Use  . Smoking status: Former Smoker    Packs/day: 1.00    Years: 40.00    Pack years: 40.00    Types: Cigarettes    Last attempt to quit: 12/09/1998    Years since quitting: 19.5  . Smokeless tobacco: Never Used  Substance Use Topics  . Alcohol  use: No  . Drug use: No     Colonoscopy:  PAP:  Bone density:  Lipid panel:  Allergies  Allergen Reactions  . Latex Itching    Tape only- REACTED TO BANDAGE ON ABDOMEN  . Tape Itching    Surgical tapes     Current Outpatient Medications  Medication Sig Dispense Refill  . amLODipine (NORVASC) 2.5 MG tablet TAKE 1 TABLET BY MOUTH ONCE DAILY 90 tablet 2  . atorvastatin (LIPITOR) 40 MG tablet TAKE 1 TABLET BY MOUTH ONCE DAILY FOR CHOLESTEROL 90 tablet 1  . b complex vitamins capsule Take 1 capsule by mouth daily. AM    . Calcium Carbonate-Vitamin D3 (CALCIUM 600/VITAMIN D) 600-400 MG-UNIT TABS Take by mouth daily.    . carvedilol (COREG) 3.125 MG tablet TAKE 1 TABLET BY MOUTH ONCE DAILY IN THE MORNING 90 tablet 1  . Cinnamon 500 MG capsule Take 500 mg by mouth daily. AM AND BEDTIME    . fexofenadine (ALLEGRA) 180 MG tablet Take 180 mg by mouth daily.    . finasteride (PROSCAR) 5 MG tablet TAKE 1 TABLET BY  MOUTH ONCE DAILY 90 tablet 3  . glucose blood (ONETOUCH VERIO) test strip Check fingerstick blood sugars twice a day; LON 99 months; Dx E11.9 200 each 3  . LANREOTIDE ACETATE [place] Inject 1 Dose into the skin every 30 (thirty) days.    [name] losartan (COZAAR) 100 MG tablet TAKE 1 TABLET BY MOUTH ONCE DAILY 90 tablet 3  . Multiple Vitamins-Minerals (MULTIVITAMIN WITH MINERALS) tablet Take 1 tablet by mouth daily. AM    . Jonetta Speak LANCETS 94W MISC Check fingerstick blood sugars twice a day; LON 99 months; Dx E11.9 200 each 3  . polycarbophil (FIBERCON) 625 MG tablet Take 625 mg by mouth daily. AM    . polyethylene glycol (MIRALAX / GLYCOLAX) packet Take 17 g by mouth daily as needed.     . tamsulosin (FLOMAX) 0.4 MG CAPS capsule TAKE 1 CAPSULE BY MOUTH ONCE DAILY 90 capsule 3  . vitamin C (ASCORBIC ACID) 500 MG tablet Take 500 mg by mouth daily. AM     No current facility-administered medications for this visit.     OBJECTIVE: Vitals:   06/08/18 0905  BP: 121/77  Pulse: (!) 52  Temp: (!) 97.5 F (36.4 C)  SpO2: 97%     Body mass index is 26.85 kg/m.    ECOG FS:0 - Asymptomatic  General: Well-developed, well-nourished, no acute distress. Eyes: Pink conjunctiva, anicteric sclera. HEENT: Normocephalic, moist mucous membranes. Lungs: Clear to auscultation bilaterally. Heart: Regular rate and rhythm. No rubs, murmurs, or gallops. Abdomen: Soft, nontender, nondistended. No organomegaly noted, normoactive bowel sounds. Musculoskeletal: No edema, cyanosis, or clubbing. Neuro: Alert, answering all questions appropriately. Cranial nerves grossly intact. Skin: No rashes or petechiae noted. Psych: Normal affect.  LAB RESULTS:  Lab Results  Component Value Date   NA 139 06/08/2018   K 4.2 06/08/2018   CL 103 06/08/2018   CO2 29 06/08/2018   GLUCOSE 113 (H) 06/08/2018   BUN 22 06/08/2018   CREATININE 0.92 06/08/2018   CALCIUM 9.7 06/08/2018   PROT 7.0 05/11/2018   ALBUMIN 3.8  05/11/2018   AST 29 05/11/2018   ALT 35 05/11/2018   ALKPHOS 160 (H) 05/11/2018   BILITOT 0.7 05/11/2018   GFRNONAA >60 06/08/2018   GFRAA >60 06/08/2018    Lab Results  Component Value Date   WBC 9.0 05/11/2018   NEUTROABS 6.7 05/11/2018   HGB  13.7 05/11/2018   HCT 41.6 05/11/2018   MCV 90.0 05/11/2018   PLT 226 05/11/2018     STUDIES: Ir Radiologist Eval & Mgmt  Result Date: 06/08/2018 Please refer to notes tab for details about interventional procedure. 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  PLAN:    1. 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 Patient underwent left upper lobe lobectomy in [place] on June 04, 2013. Final pathology results revealed a well differentiated neuroendocrine tumor with positive bronchial margins. Patient did not receive adjuvant XRT or chemotherapy at that time.  Patient's most recent MRI on January 18, 2018 revealed new lesions in his liver and he subsequently underwent Y 90 ablation on April 28, 2018.  Chromogranin A levels are pending at time of dictation. Proceed with 120 mg subcutaneous lanreotide today.  Return to clinic in 4 and 8 weeks for treatment only and then in 12 weeks for further evaluation and continuation of lanreotide.  Follow-up with interventional radiology as scheduled.  Plan to repeat MRI in March 2020.    I spent a total of 30 minutes face-to-face with the patient of which greater than 50% of the visit was spent in counseling and coordination of care as detailed above.   Patient expressed understanding and was in agreement with this plan. He also understands that He can call clinic at any time with any questions, concerns, or complaints.   Cancer Staging Neuroendocrine carcinoma of lung [facility]) Staging form: Lung, AJCC 8th Edition - Clinical stage from  12/28/2016: Stage IVA (cT2a, cN0, cM1b) - Signed by Lloyd Huger, MD on 12/28/2016   Lloyd Huger, MD   06/09/2018 6:35 AM

## 2018-06-08 ENCOUNTER — Other Ambulatory Visit: Payer: Medicare Other

## 2018-06-08 ENCOUNTER — Encounter: Payer: Self-pay | Admitting: Radiology

## 2018-06-08 ENCOUNTER — Ambulatory Visit
Admission: RE | Admit: 2018-06-08 | Discharge: 2018-06-08 | Disposition: A | Discharge status: Home / Self Care | Payer: Medicare Other | Source: Ambulatory Visit | Attending: Interventional Radiology | Admitting: Interventional Radiology

## 2018-06-08 ENCOUNTER — Inpatient Hospital Stay: Payer: Medicare Other

## 2018-06-08 ENCOUNTER — Inpatient Hospital Stay (HOSPITAL_BASED_OUTPATIENT_CLINIC_OR_DEPARTMENT_OTHER): Payer: Medicare Other | Admitting: Oncology

## 2018-06-08 ENCOUNTER — Ambulatory Visit: Payer: Medicare Other

## 2018-06-08 ENCOUNTER — Ambulatory Visit: Payer: Medicare Other | Admitting: Oncology

## 2018-06-08 ENCOUNTER — Other Ambulatory Visit: Payer: Self-pay

## 2018-06-08 VITALS — BP 121/77 | HR 52 | Temp 97.5°F | Ht 64.0 in | Wt 156.4 lb

## 2018-06-08 DIAGNOSIS — C7A8 Other malignant neuroendocrine tumors: Secondary | ICD-10-CM

## 2018-06-08 DIAGNOSIS — Z9889 Other specified postprocedural states: Secondary | ICD-10-CM | POA: Diagnosis not present

## 2018-06-08 DIAGNOSIS — Z902 Acquired absence of lung [part of]: Secondary | ICD-10-CM

## 2018-06-08 DIAGNOSIS — C7B8 Other secondary neuroendocrine tumors: Principal | ICD-10-CM

## 2018-06-08 DIAGNOSIS — C7B02 Secondary carcinoid tumors of liver: Secondary | ICD-10-CM

## 2018-06-08 DIAGNOSIS — C7A090 Malignant carcinoid tumor of the bronchus and lung: Secondary | ICD-10-CM

## 2018-06-08 HISTORY — PX: IR RADIOLOGIST EVAL & MGMT: IMG5224

## 2018-06-08 LAB — BASIC METABOLIC PANEL
Anion gap: 7 (ref 5–15)
BUN: 22 mg/dL (ref 8–23)
CO2: 29 mmol/L (ref 22–32)
Calcium: 9.7 mg/dL (ref 8.9–10.3)
Chloride: 103 mmol/L (ref 98–111)
Creatinine, Ser: 0.92 mg/dL (ref 0.61–1.24)
GFR calc Af Amer: >60 mL/min (ref >60)
GFR calc non Af Amer: >60 mL/min (ref >60)
Glucose, Bld: 113 mg/dL — ABNORMAL HIGH (ref 70–99)
Potassium: 4.2 mmol/L (ref 3.5–5.1)
Sodium: 139 mmol/L (ref 135–145)

## 2018-06-08 MED ORDER — LANREOTIDE ACETATE 120 MG/0.5ML ~~LOC~~ SOLN
120.0000 mg | Freq: Once | SUBCUTANEOUS | Status: AC
  Administered 2018-06-08: 120 mg via SUBCUTANEOUS
  Filled 2018-06-08: qty 120

## 2018-06-08 NOTE — Progress Notes (Signed)
Patient is here today to follow up on his neuroendocrine carcinoma of lung. Patient stated that he gets SOB only on exertion. Patient denied fever, chills, nausea, vomiting, constipation and diarrhea.

## 2018-06-08 NOTE — Progress Notes (Signed)
Patient ID: Jason Malone, male   DOB: 04-04-1941, 78 y.o.   MRN: [id number]       Chief Complaint:  5-week status post right hepatic Y 90 embolization  Referring Physician(s): [place]  History of Present Illness: Jason Malone is a 78 y.o. male with stage IVa metastatic neuroendocrine tumor to the liver.  He returns for outpatient follow-up.  He is now 5-week status post right hepatic Y 90 embolization.  He is now undergone bilateral treatments.  Overall he is recovering very well.  He reports 2-week history of mild flulike symptoms following the treatment but this has resolved.  No significant abdominal pain, flank pain, weakness, fatigue, or signs of liver failure.  No significant diarrhea symptoms or flushing syndrome.  No recent fever or illness.  Stable weight and appetite.  Excellent functional status.  He remains active and walks daily on a treadmill.  No current chest pain, shortness of breath abdominal pain.  Overall he is at his baseline.  No interval surveillance imaging.  Past Medical History:  Diagnosis Date  . Allergic rhinitis   . Benign prostatic hypertrophy with lower urinary tract symptoms (LUTS)   . Chest pain    a. 05/2013 MV: EF 70%, no ischemia.  . Diverticulitis    DIVERTICULOSIS  . Enlarged prostate   . Essential hypertension    CONTROLLED ON MEDS  . Full dentures    upper and lower  . H/O hypokalemia   . Hx of atrial fibrillation without current medication    CARDIOLOGIST-DR [facility]  . 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 Hyperlipemia   . Hypomagnesemia   . IFG (impaired fasting glucose)   . Liver cancer ([place])   . Liver cancer ([place]) 12/08/2016  . Liver mass, left lobe 08/28/2016   Probably hemangioma; will get MRI of liver, refer to GI  . Lung cancer ([place])    a. carcinoid, left lung, Stage 1b (T2a, N0, cM0);  b. 05/2013 s/p VATS & LULobectomy.  . Monocytosis   . Osteopenia   . Paroxysmal atrial flutter ([facility])    a.  03/2013->no recurrence;  b. CHA2DS2VASc = 2-->not currently on anticoagulation;  c. 05/2012 Echo: EF 55-60%, normal RV.  [name] Wears glasses     Past Surgical History:  Procedure Laterality Date  . BRONCHOSCOPY     04/06/2013  . CARDIOVASCULAR STRESS TEST  05/2013   a. No evidence of ischemia or infarct, EF 70%, no WMAs  . CATARACT EXTRACTION W/PHACO Left 07/14/2015   Procedure: CATARACT EXTRACTION PHACO AND INTRAOCULAR LENS PLACEMENT (IOC);  Surgeon: [name], MD;  Location: [place];  Service: Ophthalmology;  Laterality: Left;  . CATARACT EXTRACTION W/PHACO Right 10/01/2015   Procedure: CATARACT EXTRACTION PHACO AND INTRAOCULAR LENS PLACEMENT (IOC);  Surgeon: [name], MD;  Location: [place];  Service: Ophthalmology;  Laterality: Right;  . COLONOSCOPY W/ POLYPECTOMY     bleed after-had to go to surgery to stop bleeding via colonoscopy  . COLONOSCOPY WITH PROPOFOL N/A 11/19/2015   Procedure: COLONOSCOPY WITH PROPOFOL;  Surgeon: [name], MD;  Location: [facility] ENDOSCOPY;  Service: Endoscopy;  Laterality: N/A;  . DUPUYTREN CONTRACTURE RELEASE  12/15/2011   Procedure: DUPUYTREN CONTRACTURE RELEASE;  Surgeon: [name], MD;  Location: [place];  Service: Orthopedics;  Laterality: Left;  Fasciotomy left ring finger dupuytrens  . IR ANGIOGRAM SELECTIVE EACH ADDITIONAL VESSEL  07/26/2017  . IR ANGIOGRAM SELECTIVE EACH ADDITIONAL VESSEL  07/26/2017  . IR ANGIOGRAM SELECTIVE EACH ADDITIONAL VESSEL  07/26/2017  . IR ANGIOGRAM SELECTIVE EACH ADDITIONAL VESSEL  07/26/2017  . IR ANGIOGRAM SELECTIVE EACH ADDITIONAL VESSEL  07/26/2017  . IR ANGIOGRAM SELECTIVE EACH ADDITIONAL VESSEL  08/11/2017  . IR ANGIOGRAM SELECTIVE EACH ADDITIONAL VESSEL  04/28/2018  . IR ANGIOGRAM VISCERAL SELECTIVE  07/26/2017  . IR ANGIOGRAM VISCERAL SELECTIVE  08/11/2017  . IR ANGIOGRAM VISCERAL SELECTIVE  04/28/2018  . IR EMBO ARTERIAL NOT HEMORR HEMANG INC GUIDE ROADMAPPING   07/26/2017  . IR EMBO TUMOR ORGAN ISCHEMIA INFARCT INC GUIDE ROADMAPPING  08/11/2017  . IR EMBO TUMOR ORGAN ISCHEMIA INFARCT INC GUIDE ROADMAPPING  04/28/2018  . IR RADIOLOGIST EVAL & MGMT  07/07/2017  . IR RADIOLOGIST EVAL & MGMT  09/07/2017  . IR RADIOLOGIST EVAL & MGMT  12/21/2017  . IR RADIOLOGIST EVAL & MGMT  03/22/2018  . IR RADIOLOGIST EVAL & MGMT  06/08/2018  . IR US GUIDE VASC ACCESS RIGHT  07/26/2017  . IR US GUIDE VASC ACCESS RIGHT  08/11/2017  . IR US GUIDE VASC ACCESS RIGHT  04/28/2018  . LUNG LOBECTOMY  06/10/13   upper left lung  . MULTIPLE TOOTH EXTRACTIONS    . REMOVAL RETAINED LENS Right 11/17/2015   Procedure: REMOVAL RETAINED LENS FROAGMENTS RIGHT EYE;  Surgeon: [name], MD;  Location: [place];  Service: Ophthalmology;  Laterality: Right;  . TONSILLECTOMY    . UPPER GASTROINTESTINAL ENDOSCOPY  11-03-15   Dr [name]  . VASECTOMY    . VIDEO ASSISTED THORACOSCOPY (VATS)/WEDGE RESECTION Left 06/04/2013   Procedure: VIDEO ASSISTED THORACOSCOPY (VATS)/WEDGE RESECTION;  Surgeon: [name], MD;  Location: [place];  Service: Thoracic;  Laterality: Left;  [name] VIDEO BRONCHOSCOPY N/A 06/04/2013   Procedure: VIDEO BRONCHOSCOPY;  Surgeon: [name], MD;  Location[facility] OR;  Service: Thoracic;  Laterality: N/A;    Allergies: Latex and Tape  Medications: Prior to Admission medications   Medication Sig Start Date End Date Taking? Authorizing Provider  amLODipine (NORVASC) 2.5 MG tablet TAKE 1 TABLET BY MOUTH ONCE DAILY 12/30/17   Minna Merritts, MD  atorvastatin (LIPITOR) 40 MG tablet TAKE 1 TABLET BY MOUTH ONCE DAILY FOR CHOLESTEROL 02/17/18   Lada, Satira Anis, MD  b complex vitamins capsule Take 1 capsule by mouth daily. AM    [provider]  Calcium Carbonate-Vitamin D3 (CALCIUM 600/VITAMIN D) 600-400 MG-UNIT TABS Take by mouth daily.    [provider]  carvedilol (COREG) 3.125 MG tablet TAKE 1 TABLET BY MOUTH ONCE DAILY IN THE MORNING  02/16/18   Gollan, Kathlene November, MD  Cinnamon 500 MG capsule Take 500 mg by mouth daily. AM AND BEDTIME    [provider]  fexofenadine (ALLEGRA) 180 MG tablet Take 180 mg by mouth daily.    [provider]  finasteride (PROSCAR) 5 MG tablet TAKE 1 TABLET BY MOUTH ONCE DAILY 10/11/17   Lada, Satira Anis, MD  glucose blood (ONETOUCH VERIO) test strip Check fingerstick blood sugars twice a day; LON 99 months; Dx E11.9 04/25/18   Lada, Satira Anis, MD  LANREOTIDE ACETATE [place] Inject 1 Dose into the skin every 30 (thirty) days.    [provider]  losartan (COZAAR) 100 MG tablet TAKE 1 TABLET BY MOUTH ONCE DAILY 12/19/17   Minna Merritts, MD  Multiple Vitamins-Minerals (MULTIVITAMIN WITH MINERALS) tablet Take 1 tablet by mouth daily. AM    [provider]  Jonetta Speak LANCETS 86P MISC Check  fingerstick blood sugars twice a day; LON 99 months; Dx E11.9 04/25/18   Arnetha Courser, MD  polycarbophil (FIBERCON) 625 MG tablet Take 625 mg by mouth daily. AM    [provider]  polyethylene glycol (MIRALAX / GLYCOLAX) packet Take 17 g by mouth daily as needed.     [provider]  tamsulosin (FLOMAX) 0.4 MG CAPS capsule TAKE 1 CAPSULE BY MOUTH ONCE DAILY 04/18/18   Lada, Satira Anis, MD  vitamin C (ASCORBIC ACID) 500 MG tablet Take 500 mg by mouth daily. AM    [provider]     Family History  Problem Relation Age of Onset  . Stroke Mother   . Alzheimer's disease Mother   . Hypertension Sister   . Hyperlipidemia Sister   . Hypertension Brother   . Hyperlipidemia Brother   . Diabetes Brother   . Cancer Sister        lung and colon    Social History   Socioeconomic History  . Marital status: Married    Spouse name: Not on file  . Number of children: Not on file  . Years of education: Not on file  . Highest education level: Not on file  Occupational History  . Not on file  Social Needs  . Financial resource strain: Not on file  .  Food insecurity:    Worry: Not on file    Inability: Not on file  . Transportation needs:    Medical: Not on file    Non-medical: Not on file  Tobacco Use  . Smoking status: Former Smoker    Packs/day: 1.00    Years: 40.00    Pack years: 40.00    Types: Cigarettes    Last attempt to quit: 12/09/1998    Years since quitting: 19.5  . Smokeless tobacco: Never Used  Substance and Sexual Activity  . Alcohol use: No  . Drug use: No  . Sexual activity: Yes  Lifestyle  . Physical activity:    Days per week: Not on file    Minutes per session: Not on file  . Stress: Not on file  Relationships  . Social connections:    Talks on phone: Not on file    Gets together: Not on file    Attends religious service: Not on file    Active member of club or organization: Not on file    Attends meetings of clubs or organizations: Not on file    Relationship status: Not on file  Other Topics Concern  . Not on file  Social History Narrative  . Not on file    ECOG Status: 0 - Asymptomatic  Review of Systems: A 12 point ROS discussed and pertinent positives are indicated in the HPI above.  All other systems are negative.  Review of Systems  Vital Signs: BP 133/72   Pulse (!) 56   Temp 97.8 F (36.6 C) (Oral)   Resp 15   Ht 5\' 4"  (1.626 m)   Wt 69.2 kg   SpO2 98%   BMI 26.18 kg/m   Physical Exam Constitutional:      General: He is not in acute distress.    Appearance: He is normal weight. He is not toxic-appearing.  Eyes:     General: No scleral icterus.    Conjunctiva/sclera: Conjunctivae normal.  Cardiovascular:     Rate and Rhythm: Normal rate and regular rhythm.     Heart sounds: No murmur.  Pulmonary:  Effort: Pulmonary effort is normal.     Breath sounds: Normal breath sounds.  Abdominal:     Palpations: Abdomen is soft.  Skin:    General: Skin is warm and dry.     Coloration: Skin is not jaundiced.  Neurological:     General: No focal deficit present.      Mental Status: He is alert.  Psychiatric:        Mood and Affect: Mood normal.     Imaging: Ir Radiologist Eval & Mgmt  Result Date: 06/08/2018 Please refer to notes tab for details about interventional procedure. (Op Note)   Labs:  CBC: Recent Labs    02/16/18 1031 03/16/18 1033 04/28/18 0750 05/11/18 1053  WBC 9.0 8.1 9.1 9.0  HGB 14.5 14.7 15.3 13.7  HCT 44.8 45.0 47.6 41.6  PLT 176 166 174 226    COAGS: Recent Labs    07/26/17 0748 08/11/17 0738 04/28/18 0750  INR 1.02 0.94 0.91    BMP: Recent Labs    04/13/18 1055 04/28/18 0750 05/11/18 1053 06/08/18 0843  NA 142 140 140 139  K 4.2 4.2 4.2 4.2  CL 105 105 105 103  CO2 29 24 26 29   GLUCOSE 131* 138* 97 113*  BUN 19 18 17 22   CALCIUM 9.5 9.6 9.6 9.7  CREATININE 0.86 0.95 0.89 0.92  GFRNONAA >60 >60 >60 >60  GFRAA >60 >60 >60 >60    LIVER FUNCTION TESTS: Recent Labs    02/16/18 1031 03/16/18 1033 04/28/18 0750 05/11/18 1053  BILITOT 0.9 0.7 0.9 0.7  AST 24 22 27 29   ALT 33 25 25 35  ALKPHOS 125 119 103 160*  PROT 7.1 7.1 7.0 7.0  ALBUMIN 4.2 4.3 4.3 3.8    TUMOR MARKERS: Recent Labs    02/16/18 1031 03/16/18 1033 04/28/18 0832 05/11/18 1053  CHROMGRNA 5 5 4 5     Assessment and Plan:  5-week status post right hepatic Y 90 embolization.  Overall he is recovered very well.  He is now had both left and right Y 90 treatments.  Plan: Continue monthly Sandostatin injections with Dr. Grayland Ormond.  Follow-up MRI in 3 months.  Electronically Signed: Greggory Keen 06/08/2018, 3:53 PM   I spent a total of    25 Minutes in face to face in clinical consultation, greater than 50% of which was counseling/coordinating care for this patient with metastatic neuroendocrine tumor to the liver

## 2018-06-12 ENCOUNTER — Encounter: Payer: Self-pay | Admitting: Nurse Practitioner

## 2018-06-12 ENCOUNTER — Ambulatory Visit (INDEPENDENT_AMBULATORY_CARE_PROVIDER_SITE_OTHER): Payer: Medicare Other | Admitting: Nurse Practitioner

## 2018-06-12 VITALS — BP 116/58 | HR 66 | Temp 97.9°F | Resp 14 | Ht 64.0 in | Wt 155.3 lb

## 2018-06-12 DIAGNOSIS — E119 Type 2 diabetes mellitus without complications: Secondary | ICD-10-CM | POA: Diagnosis not present

## 2018-06-12 DIAGNOSIS — E782 Mixed hyperlipidemia: Secondary | ICD-10-CM

## 2018-06-12 NOTE — Patient Instructions (Addendum)
- You can check your blood sugar twice a week and if having symptoms.  - Keep up the great work, please reschedule your diabetic educator appointment when you can.  - Whenever you have a chance please schedule your eye doctor appointment.    Carbohydrate Counting for Diabetes Mellitus, Adult  Carbohydrate counting is a method of keeping track of how many carbohydrates you eat. Eating carbohydrates naturally increases the amount of sugar (glucose) in the blood. Counting how many carbohydrates you eat helps keep your blood glucose within normal limits, which helps you manage your diabetes (diabetes mellitus). It is important to know how many carbohydrates you can safely have in each meal. This is different for every person. A diet and nutrition specialist (registered dietitian) can help you make a meal plan and calculate how many carbohydrates you should have at each meal and snack. Carbohydrates are found in the following foods:  Grains, such as breads and cereals.  Dried beans and soy products.  Starchy vegetables, such as potatoes, peas, and corn.  Fruit and fruit juices.  Milk and yogurt.  Sweets and snack foods, such as cake, cookies, candy, chips, and soft drinks. How do I count carbohydrates? There are two ways to count carbohydrates in food. You can use either of the methods or a combination of both. Reading "Nutrition Facts" on packaged food The "Nutrition Facts" list is included on the labels of almost all packaged foods and beverages in the [place] It includes:  The serving size.  Information about nutrients in each serving, including the grams (g) of carbohydrate per serving. To use the "Nutrition Facts":  Decide how many servings you will have.  Multiply the number of servings by the number of carbohydrates per serving.  The resulting number is the total amount of carbohydrates that you will be having. Learning standard serving sizes of other foods When you eat  carbohydrate foods that are not packaged or do not include "Nutrition Facts" on the label, you need to measure the servings in order to count the amount of carbohydrates:  Measure the foods that you will eat with a food scale or measuring cup, if needed.  Decide how many standard-size servings you will eat.  Multiply the number of servings by 15. Most carbohydrate-rich foods have about 15 g of carbohydrates per serving. ? For example, if you eat 8 oz (170 g) of strawberries, you will have eaten 2 servings and 30 g of carbohydrates (2 servings x 15 g = 30 g).  For foods that have more than one food mixed, such as soups and casseroles, you must count the carbohydrates in each food that is included. The following list contains standard serving sizes of common carbohydrate-rich foods. Each of these servings has about 15 g of carbohydrates:   hamburger bun or  English muffin.   oz (15 mL) syrup.   oz (14 g) jelly.  1 slice of bread.  1 six-inch tortilla.  3 oz (85 g) cooked rice or pasta.  4 oz (113 g) cooked dried beans.  4 oz (113 g) starchy vegetable, such as peas, corn, or potatoes.  4 oz (113 g) hot cereal.  4 oz (113 g) mashed potatoes or  of a large baked potato.  4 oz (113 g) canned or frozen fruit.  4 oz (120 mL) fruit juice.  4-6 crackers.  6 chicken nuggets.  6 oz (170 g) unsweetened dry cereal.  6 oz (170 g) plain fat-free yogurt or yogurt sweetened with  artificial sweeteners.  8 oz (240 mL) milk.  8 oz (170 g) fresh fruit or one small piece of fruit.  24 oz (680 g) popped popcorn. Example of carbohydrate counting Sample meal  3 oz (85 g) chicken breast.  6 oz (170 g) brown rice.  4 oz (113 g) corn.  8 oz (240 mL) milk.  8 oz (170 g) strawberries with sugar-free whipped topping. Carbohydrate calculation 1. Identify the foods that contain carbohydrates: ? Rice. ? Corn. ? Milk. ? Strawberries. 2. Calculate how many servings you have of each  food: ? 2 servings rice. ? 1 serving corn. ? 1 serving milk. ? 1 serving strawberries. 3. Multiply each number of servings by 15 g: ? 2 servings rice x 15 g = 30 g. ? 1 serving corn x 15 g = 15 g. ? 1 serving milk x 15 g = 15 g. ? 1 serving strawberries x 15 g = 15 g. 4. Add together all of the amounts to find the total grams of carbohydrates eaten: ? 30 g + 15 g + 15 g + 15 g = 75 g of carbohydrates total. Summary  Carbohydrate counting is a method of keeping track of how many carbohydrates you eat.  Eating carbohydrates naturally increases the amount of sugar (glucose) in the blood.  Counting how many carbohydrates you eat helps keep your blood glucose within normal limits, which helps you manage your diabetes.  A diet and nutrition specialist (registered dietitian) can help you make a meal plan and calculate how many carbohydrates you should have at each meal and snack. This information is not intended to replace advice given to you by your health care provider. Make sure you discuss any questions you have with your health care provider. Document Released: 04/26/2005 Document Revised: 11/03/2016 Document Reviewed: 10/08/2015 Elsevier Interactive Patient Education  2019 [employer].

## 2018-06-12 NOTE — Progress Notes (Signed)
Name: Jason Malone   MRN: [id number]    DOB: 1941-01-22   Date:06/12/2018       Progress Note  Subjective  Chief Complaint  Chief Complaint  Patient presents with  . Follow-up  . Hyperlipidemia  . Diabetes    HPI   Diabetes Average blood sugar is 131 after lunch, after dinner it is 140 range, lunch time 112, fasting blood sugars 106-123.  Saw diabetic educator, eating 3 meals instead of 2, eating less bread and carbs, eating more vegetables, smaller portions. Eating lots of chicken.  Saw diabetic educator- had to cancel last appointment but will reschedule Lab Results  Component Value Date   HGBA1C 6.5 (H) 04/10/2018    Lab Results  Component Value Date   CHOL 124 04/10/2018   HDL 41 04/10/2018   LDLCALC 64 04/10/2018   TRIG 100 04/10/2018   CHOLHDL 3.0 04/10/2018     Patient Active Problem List   Diagnosis Date Noted  . Microalbuminuria due to type 2 diabetes mellitus ([place]) 04/12/2018  . Type 2 diabetes mellitus ([place]) 04/10/2018  . Osteopenia determined by x-ray 01/07/2017  . 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 Low back pain 12/17/2016  . Liver mass, left lobe 08/28/2016  . 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 History of lung cancer 06/29/2016  . Aortic atherosclerosis ([place]) 04/06/2016  . Coronary artery disease involving native heart without angina pectoris 04/06/2016  . History of colonic polyps 10/28/2015  . Medication monitoring encounter 08/15/2015  . Paroxysmal atrial flutter ([place])   . Elevated serum alkaline phosphatase level 03/05/2015  . Allergic rhinitis   . BPH without urinary obstruction   . Hyperlipidemia 02/13/2014  . Sinus bradycardia 07/18/2013  . S/P lobectomy of lung 06/04/2013  . Atrial flutter ([place]) 04/09/2013  . Smoking history 04/09/2013  . Essential hypertension 04/09/2013  . Rectal bleeding 12/15/2012    Past Medical History:  Diagnosis Date  . Allergic rhinitis   . Benign prostatic  hypertrophy with lower urinary tract symptoms (LUTS)   . Chest pain    a. 05/2013 MV: EF 70%, no ischemia.  . Diverticulitis    DIVERTICULOSIS  . Enlarged prostate   . Essential hypertension    CONTROLLED ON MEDS  . Full dentures    upper and lower  . H/O hypokalemia   . Hx of atrial fibrillation without current medication    CARDIOLOGIST-DR [facility]  . 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 Hyperlipemia   . Hypomagnesemia   . IFG (impaired fasting glucose)   . Liver cancer ([place])   . Liver cancer ([place]) 12/08/2016  . Liver mass, left lobe 08/28/2016   Probably hemangioma; will get MRI of liver, refer to GI  . Lung cancer ([place])    a. carcinoid, left lung, Stage 1b (T2a, N0, cM0);  b. 05/2013 s/p VATS & LULobectomy.  . Monocytosis   . Osteopenia   . Paroxysmal atrial flutter ([facility])    a. 03/2013->no recurrence;  b. CHA2DS2VASc = 2-->not currently on anticoagulation;  c. 05/2012 Echo: EF 55-60%, normal RV.  [name] Wears glasses     Past Surgical History:  Procedure Laterality Date  . BRONCHOSCOPY     04/06/2013  . CARDIOVASCULAR STRESS TEST  05/2013   a. No evidence of ischemia or infarct, EF 70%, no WMAs  . CATARACT EXTRACTION W/PHACO Left 07/14/2015   Procedure: CATARACT EXTRACTION PHACO AND INTRAOCULAR LENS PLACEMENT (IOC);  Surgeon: [name], MD;  Location: [place];  Service: Ophthalmology;  Laterality: Left;  . CATARACT EXTRACTION W/PHACO Right 10/01/2015   Procedure: CATARACT EXTRACTION PHACO AND INTRAOCULAR LENS PLACEMENT (IOC);  Surgeon: [name], MD;  Location: [place];  Service: Ophthalmology;  Laterality: Right;  . COLONOSCOPY W/ POLYPECTOMY     bleed after-had to go to surgery to stop bleeding via colonoscopy  . COLONOSCOPY WITH PROPOFOL N/A 11/19/2015   Procedure: COLONOSCOPY WITH PROPOFOL;  Surgeon: [name], MD;  Location: [facility] ENDOSCOPY;  Service: Endoscopy;  Laterality: N/A;  . DUPUYTREN  CONTRACTURE RELEASE  12/15/2011   Procedure: DUPUYTREN CONTRACTURE RELEASE;  Surgeon: [name], MD;  Location: [place];  Service: Orthopedics;  Laterality: Left;  Fasciotomy left ring finger dupuytrens  . IR ANGIOGRAM SELECTIVE EACH ADDITIONAL VESSEL  07/26/2017  . IR ANGIOGRAM SELECTIVE EACH ADDITIONAL VESSEL  07/26/2017  . IR ANGIOGRAM SELECTIVE EACH ADDITIONAL VESSEL  07/26/2017  . IR ANGIOGRAM SELECTIVE EACH ADDITIONAL VESSEL  07/26/2017  . IR ANGIOGRAM SELECTIVE EACH ADDITIONAL VESSEL  07/26/2017  . IR ANGIOGRAM SELECTIVE EACH ADDITIONAL VESSEL  08/11/2017  . IR ANGIOGRAM SELECTIVE EACH ADDITIONAL VESSEL  04/28/2018  . IR ANGIOGRAM VISCERAL SELECTIVE  07/26/2017  . IR ANGIOGRAM VISCERAL SELECTIVE  08/11/2017  . IR ANGIOGRAM VISCERAL SELECTIVE  04/28/2018  . IR EMBO ARTERIAL NOT HEMORR HEMANG INC GUIDE ROADMAPPING  07/26/2017  . IR EMBO TUMOR ORGAN ISCHEMIA INFARCT INC GUIDE ROADMAPPING  08/11/2017  . IR EMBO TUMOR ORGAN ISCHEMIA INFARCT INC GUIDE ROADMAPPING  04/28/2018  . IR RADIOLOGIST EVAL & MGMT  07/07/2017  . IR RADIOLOGIST EVAL & MGMT  09/07/2017  . IR RADIOLOGIST EVAL & MGMT  12/21/2017  . IR RADIOLOGIST EVAL & MGMT  03/22/2018  . IR RADIOLOGIST EVAL & MGMT  06/08/2018  . IR US GUIDE VASC ACCESS RIGHT  07/26/2017  . IR US GUIDE VASC ACCESS RIGHT  08/11/2017  . IR US GUIDE VASC ACCESS RIGHT  04/28/2018  . LUNG LOBECTOMY  06/10/13   upper left lung  . MULTIPLE TOOTH EXTRACTIONS    . REMOVAL RETAINED LENS Right 11/17/2015   Procedure: REMOVAL RETAINED LENS FROAGMENTS RIGHT EYE;  Surgeon: [name], MD;  Location: [place];  Service: Ophthalmology;  Laterality: Right;  . TONSILLECTOMY    . UPPER GASTROINTESTINAL ENDOSCOPY  11-03-15   Dr [name]  . VASECTOMY    . VIDEO ASSISTED THORACOSCOPY (VATS)/WEDGE RESECTION Left 06/04/2013   Procedure: VIDEO ASSISTED THORACOSCOPY (VATS)/WEDGE RESECTION;  Surgeon: [name], MD;  Location: [place];  Service: Thoracic;   Laterality: Left;  [name] VIDEO BRONCHOSCOPY N/A 06/04/2013   Procedure: VIDEO BRONCHOSCOPY;  Surgeon: [name], MD;  Location[facility] OR;  Service: Thoracic;  Laterality: N/A;    Social History   Tobacco Use  . Smoking status: Former Smoker    Packs/day: 1.00    Years: 40.00    Pack years: 40.00    Types: Cigarettes    Last attempt to quit: 12/09/1998    Years since quitting: 19.5  . Smokeless tobacco: Never Used  Substance Use Topics  . Alcohol use: No     Current Outpatient Medications:  .  amLODipine (NORVASC) 2.5 MG tablet, TAKE 1 TABLET BY MOUTH ONCE DAILY, Disp: 90 tablet, Rfl: 2 .  atorvastatin (LIPITOR) 40 MG tablet, TAKE 1 TABLET BY MOUTH ONCE DAILY FOR CHOLESTEROL, Disp: 90 tablet, Rfl: 1 .  b complex vitamins capsule, Take 1 capsule by  mouth daily. AM, Disp: , Rfl:  .  Calcium Carbonate-Vitamin D3 (CALCIUM 600/VITAMIN D) 600-400 MG-UNIT TABS, Take by mouth daily., Disp: , Rfl:  .  carvedilol (COREG) 3.125 MG tablet, TAKE 1 TABLET BY MOUTH ONCE DAILY IN THE MORNING, Disp: 90 tablet, Rfl: 1 .  Cinnamon 500 MG capsule, Take 500 mg by mouth daily. AM AND BEDTIME, Disp: , Rfl:  .  fexofenadine (ALLEGRA) 180 MG tablet, Take 180 mg by mouth daily., Disp: , Rfl:  .  finasteride (PROSCAR) 5 MG tablet, TAKE 1 TABLET BY MOUTH ONCE DAILY, Disp: 90 tablet, Rfl: 3 .  glucose blood (ONETOUCH VERIO) test strip, Check fingerstick blood sugars twice a day; LON 99 months; Dx E11.9, Disp: 200 each, Rfl: 3 .  LANREOTIDE ACETATE [place], Inject 1 Dose into the skin every 30 (thirty) days., Disp: , Rfl:  .  losartan (COZAAR) 100 MG tablet, TAKE 1 TABLET BY MOUTH ONCE DAILY, Disp: 90 tablet, Rfl: 3 .  Multiple Vitamins-Minerals (MULTIVITAMIN WITH MINERALS) tablet, Take 1 tablet by mouth daily. AM, Disp: , Rfl:  .  ONETOUCH DELICA LANCETS 40J MISC, Check fingerstick blood sugars twice a day; LON 99 months; Dx E11.9, Disp: 200 each, Rfl: 3 .  polycarbophil (FIBERCON) 625 MG tablet, Take 625 mg by mouth  daily. AM, Disp: , Rfl:  .  polyethylene glycol (MIRALAX / GLYCOLAX) packet, Take 17 g by mouth daily as needed. , Disp: , Rfl:  .  tamsulosin (FLOMAX) 0.4 MG CAPS capsule, TAKE 1 CAPSULE BY MOUTH ONCE DAILY, Disp: 90 capsule, Rfl: 3 .  vitamin C (ASCORBIC ACID) 500 MG tablet, Take 500 mg by mouth daily. AM, Disp: , Rfl:   Allergies  Allergen Reactions  . Latex Itching    Tape only- REACTED TO BANDAGE ON ABDOMEN  . Tape Itching    Surgical tapes     ROS   No other specific complaints in a complete review of systems (except as listed in HPI above).  Objective  Vitals:   06/12/18 1155  BP: (!) 116/58  Pulse: 66  Resp: 14  Temp: 97.9 F (36.6 C)  TempSrc: Oral  SpO2: 93%  Weight: 155 lb 4.8 oz (70.4 kg)  Height: 5\' 4"  (1.626 m)   Body mass index is 26.66 kg/m.  Nursing Note and Vital Signs reviewed.  Physical Exam Constitutional:      Appearance: Normal appearance.  HENT:     Head: Normocephalic and atraumatic.  Eyes:     Conjunctiva/sclera: Conjunctivae normal.  Cardiovascular:     Rate and Rhythm: Normal rate and regular rhythm.  Pulmonary:     Effort: Pulmonary effort is normal.     Breath sounds: Normal breath sounds.  Abdominal:     General: Abdomen is flat.     Palpations: Abdomen is soft.     Tenderness: There is no abdominal tenderness.  Skin:    General: Skin is warm and dry.  Neurological:     Mental Status: He is alert and oriented to person, place, and time.  Psychiatric:        Mood and Affect: Mood normal.        Thought Content: Thought content normal.      No results found for this or any previous visit (from the past 48 hour(s)).  Assessment & Plan 1. Type 2 diabetes mellitus without complication, without long-term current use of insulin (HCC) Patient has been checking blood sugar twice a day since his last appointment.  Has vastly  improved his diet.  Blood sugars seem much improved.  Recommend eye exam.  Reschedule missed appointment  with diabetic educator.  Continue healthy eating.  Limit candy.  Recommend checking blood sugars twice a week or if symptomatic.  2. Mixed hyperlipidemia Controlled on statin    -Red flags and when to present for emergency care or RTC including fever >101.25F, chest pain, shortness of breath, new/worsening/un-resolving symptoms,  reviewed with patient at time of visit. Follow up and care instructions discussed and provided in AVS.

## 2018-06-13 ENCOUNTER — Ambulatory Visit: Payer: Medicare Other | Admitting: Family Medicine

## 2018-06-26 ENCOUNTER — Ambulatory Visit: Payer: Medicare Other

## 2018-07-06 ENCOUNTER — Inpatient Hospital Stay: Payer: Medicare Other | Attending: Oncology

## 2018-07-06 ENCOUNTER — Inpatient Hospital Stay: Payer: Medicare Other

## 2018-07-06 DIAGNOSIS — Z902 Acquired absence of lung [part of]: Secondary | ICD-10-CM | POA: Diagnosis not present

## 2018-07-06 DIAGNOSIS — C7A090 Malignant carcinoid tumor of the bronchus and lung: Secondary | ICD-10-CM | POA: Diagnosis not present

## 2018-07-06 DIAGNOSIS — C7A8 Other malignant neuroendocrine tumors: Secondary | ICD-10-CM

## 2018-07-06 DIAGNOSIS — Z79899 Other long term (current) drug therapy: Secondary | ICD-10-CM | POA: Diagnosis not present

## 2018-07-06 DIAGNOSIS — C7B02 Secondary carcinoid tumors of liver: Secondary | ICD-10-CM | POA: Insufficient documentation

## 2018-07-06 LAB — COMPREHENSIVE METABOLIC PANEL
ALT: 49 U/L — ABNORMAL HIGH (ref 0–44)
AST: 37 U/L (ref 15–41)
Albumin: 4.2 g/dL (ref 3.5–5.0)
Alkaline Phosphatase: 178 U/L — ABNORMAL HIGH (ref 38–126)
Anion gap: 6 (ref 5–15)
BUN: 19 mg/dL (ref 8–23)
CO2: 28 mmol/L (ref 22–32)
Calcium: 9.5 mg/dL (ref 8.9–10.3)
Chloride: 105 mmol/L (ref 98–111)
Creatinine, Ser: 0.97 mg/dL (ref 0.61–1.24)
Glucose, Bld: 107 mg/dL — ABNORMAL HIGH (ref 70–99)
Potassium: 4.2 mmol/L (ref 3.5–5.1)
SODIUM: 139 mmol/L (ref 135–145)
Total Bilirubin: 0.8 mg/dL (ref 0.3–1.2)
Total Protein: 7.4 g/dL (ref 6.5–8.1)

## 2018-07-06 LAB — CBC WITH DIFFERENTIAL/PLATELET
Abs Immature Granulocytes: 0.02 10*3/uL (ref 0.00–0.07)
Basophils Absolute: 0.1 10*3/uL (ref 0.0–0.1)
Basophils Relative: 1 %
EOS PCT: 2 %
Eosinophils Absolute: 0.2 10*3/uL (ref 0.0–0.5)
HCT: 47 % (ref 39.0–52.0)
Hemoglobin: 15.6 g/dL (ref 13.0–17.0)
Immature Granulocytes: 0 %
Lymphocytes Relative: 8 %
Lymphs Abs: 0.6 10*3/uL — ABNORMAL LOW (ref 0.7–4.0)
MCH: 30 pg (ref 26.0–34.0)
MCHC: 33.2 g/dL (ref 30.0–36.0)
MCV: 90.4 fL (ref 80.0–100.0)
Monocytes Absolute: 1.3 10*3/uL — ABNORMAL HIGH (ref 0.1–1.0)
Monocytes Relative: 16 %
Neutro Abs: 5.5 10*3/uL (ref 1.7–7.7)
Neutrophils Relative %: 73 %
Platelets: 146 10*3/uL — ABNORMAL LOW (ref 150–400)
RBC: 5.2 MIL/uL (ref 4.22–5.81)
RDW: 14.1 % (ref 11.5–15.5)
WBC: 7.6 10*3/uL (ref 4.0–10.5)
nRBC: 0 % (ref 0.0–0.2)

## 2018-07-06 MED ORDER — LANREOTIDE ACETATE 120 MG/0.5ML ~~LOC~~ SOLN
120.0000 mg | Freq: Once | SUBCUTANEOUS | Status: AC
  Administered 2018-07-06: 120 mg via SUBCUTANEOUS
  Filled 2018-07-06: qty 120

## 2018-07-11 LAB — CHROMOGRANIN A REBASELINE
Chromogranin A (ng/mL): 258 ng/mL — ABNORMAL HIGH (ref 0.0–101.8)
Chromogranin A: 3 nmol/L (ref 0–5)

## 2018-08-02 ENCOUNTER — Other Ambulatory Visit: Payer: Self-pay

## 2018-08-03 ENCOUNTER — Inpatient Hospital Stay: Payer: Medicare Other | Attending: Oncology

## 2018-08-03 ENCOUNTER — Other Ambulatory Visit: Payer: Self-pay

## 2018-08-03 ENCOUNTER — Inpatient Hospital Stay: Payer: Medicare Other

## 2018-08-03 DIAGNOSIS — C7B02 Secondary carcinoid tumors of liver: Secondary | ICD-10-CM | POA: Diagnosis not present

## 2018-08-03 DIAGNOSIS — Z902 Acquired absence of lung [part of]: Secondary | ICD-10-CM | POA: Diagnosis not present

## 2018-08-03 DIAGNOSIS — C7A8 Other malignant neuroendocrine tumors: Secondary | ICD-10-CM

## 2018-08-03 DIAGNOSIS — Z79899 Other long term (current) drug therapy: Secondary | ICD-10-CM | POA: Diagnosis not present

## 2018-08-03 DIAGNOSIS — C7A090 Malignant carcinoid tumor of the bronchus and lung: Secondary | ICD-10-CM | POA: Insufficient documentation

## 2018-08-03 LAB — COMPREHENSIVE METABOLIC PANEL
ALT: 41 U/L (ref 0–44)
AST: 37 U/L (ref 15–41)
Albumin: 4.2 g/dL (ref 3.5–5.0)
Alkaline Phosphatase: 163 U/L — ABNORMAL HIGH (ref 38–126)
Anion gap: 7 (ref 5–15)
BUN: 20 mg/dL (ref 8–23)
CO2: 28 mmol/L (ref 22–32)
Calcium: 9.7 mg/dL (ref 8.9–10.3)
Chloride: 105 mmol/L (ref 98–111)
Creatinine, Ser: 1.1 mg/dL (ref 0.61–1.24)
GFR calc Af Amer: >60 mL/min (ref >60)
GFR calc non Af Amer: >60 mL/min (ref >60)
Glucose, Bld: 113 mg/dL — ABNORMAL HIGH (ref 70–99)
Potassium: 4.3 mmol/L (ref 3.5–5.1)
Sodium: 140 mmol/L (ref 135–145)
TOTAL PROTEIN: 7.2 g/dL (ref 6.5–8.1)
Total Bilirubin: 0.8 mg/dL (ref 0.3–1.2)

## 2018-08-03 LAB — CBC WITH DIFFERENTIAL/PLATELET
Abs Immature Granulocytes: 0.01 10*3/uL (ref 0.00–0.07)
Basophils Absolute: 0.1 10*3/uL (ref 0.0–0.1)
Basophils Relative: 2 %
Eosinophils Absolute: 0.2 10*3/uL (ref 0.0–0.5)
Eosinophils Relative: 3 %
HCT: 48 % (ref 39.0–52.0)
HEMOGLOBIN: 15.8 g/dL (ref 13.0–17.0)
Immature Granulocytes: 0 %
LYMPHS PCT: 8 %
Lymphs Abs: 0.6 10*3/uL — ABNORMAL LOW (ref 0.7–4.0)
MCH: 30.3 pg (ref 26.0–34.0)
MCHC: 32.9 g/dL (ref 30.0–36.0)
MCV: 92.1 fL (ref 80.0–100.0)
Monocytes Absolute: 1 10*3/uL (ref 0.1–1.0)
Monocytes Relative: 14 %
NEUTROS ABS: 5 10*3/uL (ref 1.7–7.7)
Neutrophils Relative %: 73 %
Platelets: 159 10*3/uL (ref 150–400)
RBC: 5.21 MIL/uL (ref 4.22–5.81)
RDW: 13.9 % (ref 11.5–15.5)
WBC: 6.9 10*3/uL (ref 4.0–10.5)
nRBC: 0 % (ref 0.0–0.2)

## 2018-08-03 MED ORDER — LANREOTIDE ACETATE 120 MG/0.5ML ~~LOC~~ SOLN
120.0000 mg | Freq: Once | SUBCUTANEOUS | Status: AC
  Administered 2018-08-03: 120 mg via SUBCUTANEOUS
  Filled 2018-08-03: qty 120

## 2018-08-04 LAB — CHROMOGRANIN A: Chromogranin A (ng/mL): 236.9 ng/mL — ABNORMAL HIGH (ref 0.0–101.8)

## 2018-08-21 ENCOUNTER — Other Ambulatory Visit: Payer: Self-pay | Admitting: Family Medicine

## 2018-08-21 NOTE — Telephone Encounter (Signed)
Lab Results  Component Value Date   ALT 41 08/03/2018   ALT 26 03/28/2013   Lab Results  Component Value Date   CHOL 124 04/10/2018   HDL 41 04/10/2018   LDLCALC 64 04/10/2018   TRIG 100 04/10/2018   CHOLHDL 3.0 04/10/2018

## 2018-08-31 ENCOUNTER — Inpatient Hospital Stay: Payer: Medicare Other

## 2018-08-31 ENCOUNTER — Inpatient Hospital Stay: Payer: Medicare Other | Attending: Oncology

## 2018-08-31 ENCOUNTER — Other Ambulatory Visit: Payer: Self-pay

## 2018-08-31 ENCOUNTER — Ambulatory Visit: Payer: Medicare Other | Admitting: Oncology

## 2018-08-31 ENCOUNTER — Encounter: Payer: Self-pay | Admitting: Oncology

## 2018-08-31 ENCOUNTER — Inpatient Hospital Stay (HOSPITAL_BASED_OUTPATIENT_CLINIC_OR_DEPARTMENT_OTHER): Payer: Medicare Other | Admitting: Oncology

## 2018-08-31 VITALS — BP 124/78 | HR 52 | Temp 97.6°F | Resp 20 | Ht 64.0 in | Wt 152.4 lb

## 2018-08-31 DIAGNOSIS — Z902 Acquired absence of lung [part of]: Secondary | ICD-10-CM | POA: Insufficient documentation

## 2018-08-31 DIAGNOSIS — C7A090 Malignant carcinoid tumor of the bronchus and lung: Secondary | ICD-10-CM

## 2018-08-31 DIAGNOSIS — C7B02 Secondary carcinoid tumors of liver: Secondary | ICD-10-CM | POA: Insufficient documentation

## 2018-08-31 DIAGNOSIS — Z79899 Other long term (current) drug therapy: Secondary | ICD-10-CM

## 2018-08-31 DIAGNOSIS — C7B8 Other secondary neuroendocrine tumors: Principal | ICD-10-CM

## 2018-08-31 DIAGNOSIS — C7A8 Other malignant neuroendocrine tumors: Secondary | ICD-10-CM

## 2018-08-31 DIAGNOSIS — Z5111 Encounter for antineoplastic chemotherapy: Secondary | ICD-10-CM

## 2018-08-31 LAB — COMPREHENSIVE METABOLIC PANEL
ALT: 34 U/L (ref 0–44)
AST: 34 U/L (ref 15–41)
Albumin: 3.9 g/dL (ref 3.5–5.0)
Alkaline Phosphatase: 194 U/L — ABNORMAL HIGH (ref 38–126)
Anion gap: 6 (ref 5–15)
BUN: 19 mg/dL (ref 8–23)
CO2: 25 mmol/L (ref 22–32)
Calcium: 9.2 mg/dL (ref 8.9–10.3)
Chloride: 106 mmol/L (ref 98–111)
Creatinine, Ser: 0.89 mg/dL (ref 0.61–1.24)
GFR calc Af Amer: >60 mL/min (ref >60)
GFR calc non Af Amer: >60 mL/min (ref >60)
Glucose, Bld: 167 mg/dL — ABNORMAL HIGH (ref 70–99)
Potassium: 3.9 mmol/L (ref 3.5–5.1)
Sodium: 137 mmol/L (ref 135–145)
Total Bilirubin: 0.9 mg/dL (ref 0.3–1.2)
Total Protein: 7 g/dL (ref 6.5–8.1)

## 2018-08-31 LAB — CBC WITH DIFFERENTIAL/PLATELET
Abs Immature Granulocytes: 0.02 10*3/uL (ref 0.00–0.07)
Basophils Absolute: 0.1 10*3/uL (ref 0.0–0.1)
Basophils Relative: 2 %
Eosinophils Absolute: 0.2 10*3/uL (ref 0.0–0.5)
Eosinophils Relative: 3 %
HCT: 44.8 % (ref 39.0–52.0)
Hemoglobin: 15.1 g/dL (ref 13.0–17.0)
Immature Granulocytes: 0 %
Lymphocytes Relative: 8 %
Lymphs Abs: 0.6 10*3/uL — ABNORMAL LOW (ref 0.7–4.0)
MCH: 30.4 pg (ref 26.0–34.0)
MCHC: 33.7 g/dL (ref 30.0–36.0)
MCV: 90.1 fL (ref 80.0–100.0)
Monocytes Absolute: 1.2 10*3/uL — ABNORMAL HIGH (ref 0.1–1.0)
Monocytes Relative: 15 %
Neutro Abs: 5.6 10*3/uL (ref 1.7–7.7)
Neutrophils Relative %: 72 %
Platelets: 159 10*3/uL (ref 150–400)
RBC: 4.97 MIL/uL (ref 4.22–5.81)
RDW: 13.6 % (ref 11.5–15.5)
WBC: 7.8 10*3/uL (ref 4.0–10.5)
nRBC: 0 % (ref 0.0–0.2)

## 2018-08-31 MED ORDER — LANREOTIDE ACETATE 120 MG/0.5ML ~~LOC~~ SOLN
120.0000 mg | Freq: Once | SUBCUTANEOUS | Status: AC
  Administered 2018-08-31: 120 mg via SUBCUTANEOUS
  Filled 2018-08-31: qty 120

## 2018-08-31 NOTE — Progress Notes (Signed)
[place]  [phone] [phone]  ID: Danne Harbor OB: 12-15-1940  MR#: [id number]  NAT#:[id number]  Patient Care Team: Arnetha Courser, MD as PCP - General (Family Medicine) [name], Forest Gleason, MD (General Surgery) Nestor Lewandowsky, MD as Referring Physician (Cardiothoracic Surgery) Minna Merritts, MD as Consulting Physician (Cardiology) [name], MD as Consulting Physician (Cardiothoracic Surgery) Lloyd Huger, MD as Consulting Physician (Oncology) Sanda Klein Satira Anis, MD as Attending Physician (Family Medicine)  CHIEF COMPLAINT: Follow-up for management of 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  INTERVAL HISTORY:  Patient presents for follow-up for the management of stage IV neuroendocrine tumor of the lung metastasis to the liver. 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 Patient underwent left upper lobe lobectomy in [place] on June 04, 2013. Final pathology results revealed a well differentiated neuroendocrine tumor with positive bronchial margins. Patient did not receive adjuvant XRT or chemotherapy at that time.  Patient's most recent MRI on January 18, 2018 revealed new lesions in his liver and he subsequently underwent Y 90 ablation on April 28, 2018.   Today he feels well and is asymptomatic.  Denies any neurologic complaints, weakness, fatigue,.  Appetite remains good and weight has been stable.  Denies any fever, chills, shortness of breath, abdominal pain, nausea or vomiting.  No diarrhea.  REVIEW OF SYSTEMS:   Review of Systems  Constitutional: Negative.  Negative for chills, fever, malaise/fatigue and weight loss.  HENT: Negative for sore throat.   Eyes: Negative for redness.  Respiratory: Negative.  Negative for cough, hemoptysis, shortness of breath and wheezing.    Cardiovascular: Negative.  Negative for chest pain, palpitations and leg swelling.  Gastrointestinal: Negative.  Negative for abdominal pain, blood in stool, diarrhea, nausea and vomiting.  Genitourinary: Negative.  Negative for dysuria.  Musculoskeletal: Negative.  Negative for back pain and myalgias.  Skin: Negative.  Negative for rash.  Neurological: Negative.  Negative for dizziness, tingling, tremors, sensory change, focal weakness and weakness.  Endo/Heme/Allergies: Does not bruise/bleed easily.  Psychiatric/Behavioral: Negative.  Negative for hallucinations. The patient is not nervous/anxious.     As per HPI. Otherwise, a complete review of systems is negative.  PAST MEDICAL HISTORY: Past Medical History:  Diagnosis Date  . Allergic rhinitis   . Benign prostatic hypertrophy with lower urinary tract symptoms (LUTS)   . Chest pain    a. 05/2013 MV: EF 70%, no ischemia.  . Diverticulitis    DIVERTICULOSIS  . Enlarged prostate   . Essential hypertension    CONTROLLED ON MEDS  . Full dentures    upper and lower  . H/O hypokalemia   . Hx of atrial fibrillation without current medication    CARDIOLOGIST-DR [facility]  . 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 Hyperlipemia   . Hypomagnesemia   . IFG (impaired fasting glucose)   . Liver cancer ([place])   . Liver cancer ([place]) 12/08/2016  . Liver mass, left lobe 08/28/2016   Probably hemangioma; will get MRI of liver, refer to GI  . Lung cancer ([place])    a. carcinoid, left lung, Stage 1b (T2a, N0, cM0);  b. 05/2013 s/p VATS & LULobectomy.  . Monocytosis   . Osteopenia   . Paroxysmal atrial flutter ([facility])    a. 03/2013->no recurrence;  b. CHA2DS2VASc = 2-->not currently on anticoagulation;  c. 05/2012  Echo: EF 55-60%, normal RV.  [name] Wears glasses     PAST SURGICAL HISTORY: Past Surgical History:  Procedure Laterality Date  . BRONCHOSCOPY     04/06/2013  . CARDIOVASCULAR STRESS TEST  05/2013   a. No  evidence of ischemia or infarct, EF 70%, no WMAs  . CATARACT EXTRACTION W/PHACO Left 07/14/2015   Procedure: CATARACT EXTRACTION PHACO AND INTRAOCULAR LENS PLACEMENT (IOC);  Surgeon: [name], MD;  Location: [place];  Service: Ophthalmology;  Laterality: Left;  . CATARACT EXTRACTION W/PHACO Right 10/01/2015   Procedure: CATARACT EXTRACTION PHACO AND INTRAOCULAR LENS PLACEMENT (IOC);  Surgeon: [name], MD;  Location: [place];  Service: Ophthalmology;  Laterality: Right;  . COLONOSCOPY W/ POLYPECTOMY     bleed after-had to go to surgery to stop bleeding via colonoscopy  . COLONOSCOPY WITH PROPOFOL N/A 11/19/2015   Procedure: COLONOSCOPY WITH PROPOFOL;  Surgeon: [name], MD;  Location: [facility];  Service: Endoscopy;  Laterality: N/A;  . DUPUYTREN CONTRACTURE RELEASE  12/15/2011   Procedure: DUPUYTREN CONTRACTURE RELEASE;  Surgeon: [name], MD;  Location: [place];  Service: Orthopedics;  Laterality: Left;  Fasciotomy left ring finger dupuytrens  . IR ANGIOGRAM SELECTIVE EACH ADDITIONAL VESSEL  07/26/2017  . IR ANGIOGRAM SELECTIVE EACH ADDITIONAL VESSEL  07/26/2017  . IR ANGIOGRAM SELECTIVE EACH ADDITIONAL VESSEL  07/26/2017  . IR ANGIOGRAM SELECTIVE EACH ADDITIONAL VESSEL  07/26/2017  . IR ANGIOGRAM SELECTIVE EACH ADDITIONAL VESSEL  07/26/2017  . IR ANGIOGRAM SELECTIVE EACH ADDITIONAL VESSEL  08/11/2017  . IR ANGIOGRAM SELECTIVE EACH ADDITIONAL VESSEL  04/28/2018  . IR ANGIOGRAM VISCERAL SELECTIVE  07/26/2017  . IR ANGIOGRAM VISCERAL SELECTIVE  08/11/2017  . IR ANGIOGRAM VISCERAL SELECTIVE  04/28/2018  . IR EMBO ARTERIAL NOT HEMORR HEMANG INC GUIDE ROADMAPPING  07/26/2017  . IR EMBO TUMOR ORGAN ISCHEMIA INFARCT INC GUIDE ROADMAPPING  08/11/2017  . IR EMBO TUMOR ORGAN ISCHEMIA INFARCT INC GUIDE ROADMAPPING  04/28/2018  . IR RADIOLOGIST EVAL & MGMT  07/07/2017  . IR RADIOLOGIST EVAL & MGMT  09/07/2017  . IR RADIOLOGIST EVAL & MGMT   12/21/2017  . IR RADIOLOGIST EVAL & MGMT  03/22/2018  . IR RADIOLOGIST EVAL & MGMT  06/08/2018  . IR US GUIDE VASC ACCESS RIGHT  07/26/2017  . IR US GUIDE VASC ACCESS RIGHT  08/11/2017  . IR US GUIDE VASC ACCESS RIGHT  04/28/2018  . LUNG LOBECTOMY  06/10/13   upper left lung  . MULTIPLE TOOTH EXTRACTIONS    . REMOVAL RETAINED LENS Right 11/17/2015   Procedure: REMOVAL RETAINED LENS FROAGMENTS RIGHT EYE;  Surgeon: [name], MD;  Location: [place];  Service: Ophthalmology;  Laterality: Right;  . TONSILLECTOMY    . UPPER GASTROINTESTINAL ENDOSCOPY  11-03-15   Dr [name]  . VASECTOMY    . VIDEO ASSISTED THORACOSCOPY (VATS)/WEDGE RESECTION Left 06/04/2013   Procedure: VIDEO ASSISTED THORACOSCOPY (VATS)/WEDGE RESECTION;  Surgeon: [name], MD;  Location: [place];  Service: Thoracic;  Laterality: Left;  [name] VIDEO BRONCHOSCOPY N/A 06/04/2013   Procedure: VIDEO BRONCHOSCOPY;  Surgeon: [name], MD;  [facility];  Service: Thoracic;  Laterality: N/A;    FAMILY HISTORY: Family History  Problem Relation Age of Onset  . Stroke Mother   . Alzheimer's disease Mother   . Hypertension Sister   . Hyperlipidemia Sister   . Hypertension Brother   . Hyperlipidemia Brother   . Diabetes Brother     ADVANCED DIRECTIVES (  Y/N):  N  HEALTH MAINTENANCE: Social History   Tobacco Use  . Smoking status: Former Smoker    Packs/day: 1.00    Years: 40.00    Pack years: 40.00    Types: Cigarettes    Last attempt to quit: 12/09/1998    Years since quitting: 19.7  . Smokeless tobacco: Never Used  Substance Use Topics  . Alcohol use: No  . Drug use: No     Colonoscopy:  PAP:  Bone density:  Lipid panel:  Allergies  Allergen Reactions  . Latex Itching    Tape only- REACTED TO BANDAGE ON ABDOMEN  . Tape Itching    Surgical tapes     Current Outpatient Medications  Medication Sig Dispense Refill  . amLODipine (NORVASC) 2.5 MG tablet TAKE 1 TABLET BY MOUTH ONCE  DAILY 90 tablet 2  . atorvastatin (LIPITOR) 40 MG tablet TAKE 1 TABLET BY MOUTH ONCE DAILY FOR CHOLESTEROL 90 tablet 1  . b complex vitamins capsule Take 1 capsule by mouth daily. AM    . Calcium Carbonate-Vitamin D3 (CALCIUM 600/VITAMIN D) 600-400 MG-UNIT TABS Take by mouth daily.    . carvedilol (COREG) 3.125 MG tablet TAKE 1 TABLET BY MOUTH ONCE DAILY IN THE MORNING 90 tablet 1  . Cinnamon 500 MG capsule Take 500 mg by mouth daily. AM AND BEDTIME    . fexofenadine (ALLEGRA) 180 MG tablet Take 180 mg by mouth daily.    . finasteride (PROSCAR) 5 MG tablet TAKE 1 TABLET BY MOUTH ONCE DAILY 90 tablet 3  . glucose blood (ONETOUCH VERIO) test strip Check fingerstick blood sugars twice a day; LON 99 months; Dx E11.9 200 each 3  . LANREOTIDE ACETATE [place] Inject 1 Dose into the skin every 30 (thirty) days.    [name] losartan (COZAAR) 100 MG tablet TAKE 1 TABLET BY MOUTH ONCE DAILY 90 tablet 3  . Multiple Vitamins-Minerals (MULTIVITAMIN WITH MINERALS) tablet Take 1 tablet by mouth daily. AM    . Jonetta Speak LANCETS 95M MISC Check fingerstick blood sugars twice a day; LON 99 months; Dx E11.9 200 each 3  . polycarbophil (FIBERCON) 625 MG tablet Take 625 mg by mouth daily. AM    . tamsulosin (FLOMAX) 0.4 MG CAPS capsule TAKE 1 CAPSULE BY MOUTH ONCE DAILY 90 capsule 3  . vitamin C (ASCORBIC ACID) 500 MG tablet Take 500 mg by mouth daily. AM    . polyethylene glycol (MIRALAX / GLYCOLAX) packet Take 17 g by mouth daily as needed.      No current facility-administered medications for this visit.     OBJECTIVE: Vitals:   08/31/18 1001  BP: 124/78  Pulse: (!) 52  Resp: 20  Temp: 97.6 F (36.4 C)     Body mass index is 26.16 kg/m.    ECOG FS:0 - Asymptomatic  Physical Exam  Constitutional: He is oriented to person, place, and time. No distress.  HENT:  Head: Normocephalic and atraumatic.  Nose: Nose normal.  Mouth/Throat: Oropharynx is clear and moist. No oropharyngeal exudate.  Eyes: Pupils are  equal, round, and reactive to light. EOM are normal. No scleral icterus.  Neck: Normal range of motion. Neck supple.  Cardiovascular: Normal rate and regular rhythm.  No murmur heard. Pulmonary/Chest: Effort normal. No respiratory distress. He has no rales. He exhibits no tenderness.  Abdominal: Soft. He exhibits no distension. There is no abdominal tenderness.  Musculoskeletal: Normal range of motion.        General: No edema.  Neurological:  He is alert and oriented to person, place, and time. No cranial nerve deficit. He exhibits normal muscle tone. Coordination normal.  Skin: Skin is warm and dry. He is not diaphoretic. No erythema.  Psychiatric: Affect normal.   LAB RESULTS:  Lab Results  Component Value Date   NA 137 08/31/2018   K 3.9 08/31/2018   CL 106 08/31/2018   CO2 25 08/31/2018   GLUCOSE 167 (H) 08/31/2018   BUN 19 08/31/2018   CREATININE 0.89 08/31/2018   CALCIUM 9.2 08/31/2018   PROT 7.0 08/31/2018   ALBUMIN 3.9 08/31/2018   AST 34 08/31/2018   ALT 34 08/31/2018   ALKPHOS 194 (H) 08/31/2018   BILITOT 0.9 08/31/2018   GFRNONAA >60 08/31/2018   GFRAA >60 08/31/2018    Lab Results  Component Value Date   WBC 7.8 08/31/2018   NEUTROABS 5.6 08/31/2018   HGB 15.1 08/31/2018   HCT 44.8 08/31/2018   MCV 90.1 08/31/2018   PLT 159 08/31/2018     STUDIES: No results found.  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 1. Neuroendocrine carcinoma metastatic to liver ([place])   2. Primary malignant neuroendocrine tumor of lung ([place])   3. Encounter for antineoplastic chemotherapy    #Chromogranin a levels are pending at the time of dictation. 08/03/2018 chromogranin a level 236. Labs are reviewed and discussed with patient.  Proceed with subcutaneous lanreotide today. Patient will return to clinic in 4 to 8 weeks for treatment only.  And in 12 weeks for further evaluation by Dr. Grayland Ormond and continuation of the treatment.  Recommend patient to follow-up with interventional radiology as scheduled.  He he needs to have a repeat MRI to assess disease status.    Patient expressed understanding and was in agreement with this plan. He also understands that He can call clinic at any time with any questions, concerns, or complaints.   Cancer Staging Neuroendocrine carcinoma of lung [facility]) Staging form: Lung, AJCC 8th Edition - Clinical stage from 12/28/2016: Stage IVA (cT2a, cN0, cM1b) - Signed by Lloyd Huger, MD on 12/28/2016  We spent sufficient time to discuss many aspect of care, questions were answered to patient's satisfaction. Total face to face encounter time for this patient visit was 25 min. >50% of the time was  spent in counseling and coordination of care.  CC Dr. Hulen Shouts, MD   08/31/2018 4:12 PM

## 2018-09-01 LAB — CHROMOGRANIN A: Chromogranin A (ng/mL): 209.6 ng/mL — ABNORMAL HIGH (ref 0.0–101.8)

## 2018-09-04 ENCOUNTER — Telehealth: Payer: Self-pay | Admitting: *Deleted

## 2018-09-04 ENCOUNTER — Other Ambulatory Visit: Payer: Self-pay | Admitting: *Deleted

## 2018-09-04 DIAGNOSIS — C7B8 Other secondary neuroendocrine tumors: Principal | ICD-10-CM

## 2018-09-04 DIAGNOSIS — C7A8 Other malignant neuroendocrine tumors: Secondary | ICD-10-CM

## 2018-09-04 NOTE — Telephone Encounter (Signed)
I spoke with interventional radiology regarding scheduling of MRI for patient. Per their office if Dr. Reesa Chew orders the scan it has to be performed at the hospital if scan is ordered by Dr. Grayland Ormond it can be performed outpatient at [place]. Order placed for MRI Abdomen w/wo contrast, due to COVID 19 it will most likely be June before MRI is performed.  Imaging will contact patient with appointment.

## 2018-09-11 ENCOUNTER — Other Ambulatory Visit: Payer: Self-pay | Admitting: Cardiovascular Disease

## 2018-09-11 ENCOUNTER — Telehealth: Payer: Self-pay

## 2018-09-11 NOTE — Telephone Encounter (Signed)
error 

## 2018-09-11 NOTE — Telephone Encounter (Signed)
Patient is not due until Oct. 2020.

## 2018-09-15 ENCOUNTER — Other Ambulatory Visit: Payer: Self-pay

## 2018-09-15 ENCOUNTER — Ambulatory Visit
Admission: RE | Admit: 2018-09-15 | Discharge: 2018-09-15 | Disposition: A | Discharge status: Home / Self Care | Payer: Medicare Other | Source: Ambulatory Visit | Attending: Oncology | Admitting: Oncology

## 2018-09-15 DIAGNOSIS — C7A8 Other malignant neuroendocrine tumors: Secondary | ICD-10-CM

## 2018-09-15 DIAGNOSIS — C7B8 Other secondary neuroendocrine tumors: Secondary | ICD-10-CM

## 2018-09-15 MED ORDER — GADOBUTROL 1 MMOL/ML IV SOLN
7.0000 mL | Freq: Once | INTRAVENOUS | Status: AC | PRN
  Administered 2018-09-15: 14:00:00 7 mL via INTRAVENOUS

## 2018-09-27 ENCOUNTER — Other Ambulatory Visit: Payer: Self-pay

## 2018-09-28 ENCOUNTER — Inpatient Hospital Stay: Payer: Medicare Other

## 2018-09-28 ENCOUNTER — Other Ambulatory Visit: Payer: Self-pay

## 2018-09-28 ENCOUNTER — Inpatient Hospital Stay: Payer: Medicare Other | Attending: Oncology

## 2018-09-28 DIAGNOSIS — C7A8 Other malignant neuroendocrine tumors: Secondary | ICD-10-CM

## 2018-09-28 DIAGNOSIS — C7B02 Secondary carcinoid tumors of liver: Secondary | ICD-10-CM | POA: Diagnosis not present

## 2018-09-28 DIAGNOSIS — Z902 Acquired absence of lung [part of]: Secondary | ICD-10-CM | POA: Diagnosis not present

## 2018-09-28 DIAGNOSIS — C7A090 Malignant carcinoid tumor of the bronchus and lung: Secondary | ICD-10-CM | POA: Diagnosis not present

## 2018-09-28 DIAGNOSIS — Z79899 Other long term (current) drug therapy: Secondary | ICD-10-CM | POA: Insufficient documentation

## 2018-09-28 LAB — CBC WITH DIFFERENTIAL/PLATELET
Abs Immature Granulocytes: 0.02 10*3/uL (ref 0.00–0.07)
Basophils Absolute: 0.1 10*3/uL (ref 0.0–0.1)
Basophils Relative: 1 %
Eosinophils Absolute: 0.2 10*3/uL (ref 0.0–0.5)
Eosinophils Relative: 2 %
HCT: 44.8 % (ref 39.0–52.0)
Hemoglobin: 15 g/dL (ref 13.0–17.0)
Immature Granulocytes: 0 %
Lymphocytes Relative: 6 %
Lymphs Abs: 0.5 10*3/uL — ABNORMAL LOW (ref 0.7–4.0)
MCH: 30.4 pg (ref 26.0–34.0)
MCHC: 33.5 g/dL (ref 30.0–36.0)
MCV: 90.9 fL (ref 80.0–100.0)
Monocytes Absolute: 1.3 10*3/uL — ABNORMAL HIGH (ref 0.1–1.0)
Monocytes Relative: 17 %
Neutro Abs: 5.7 10*3/uL (ref 1.7–7.7)
Neutrophils Relative %: 74 %
Platelets: 158 10*3/uL (ref 150–400)
RBC: 4.93 MIL/uL (ref 4.22–5.81)
RDW: 13.6 % (ref 11.5–15.5)
WBC: 7.8 10*3/uL (ref 4.0–10.5)
nRBC: 0 % (ref 0.0–0.2)

## 2018-09-28 LAB — COMPREHENSIVE METABOLIC PANEL
ALT: 36 U/L (ref 0–44)
AST: 33 U/L (ref 15–41)
Albumin: 4.1 g/dL (ref 3.5–5.0)
Alkaline Phosphatase: 193 U/L — ABNORMAL HIGH (ref 38–126)
Anion gap: 8 (ref 5–15)
BUN: 20 mg/dL (ref 8–23)
CO2: 25 mmol/L (ref 22–32)
Calcium: 9.5 mg/dL (ref 8.9–10.3)
Chloride: 105 mmol/L (ref 98–111)
Creatinine, Ser: 0.93 mg/dL (ref 0.61–1.24)
GFR calc Af Amer: >60 mL/min (ref >60)
GFR calc non Af Amer: >60 mL/min (ref >60)
Glucose, Bld: 136 mg/dL — ABNORMAL HIGH (ref 70–99)
Potassium: 4 mmol/L (ref 3.5–5.1)
Sodium: 138 mmol/L (ref 135–145)
Total Bilirubin: 0.9 mg/dL (ref 0.3–1.2)
Total Protein: 7 g/dL (ref 6.5–8.1)

## 2018-09-28 MED ORDER — LANREOTIDE ACETATE 120 MG/0.5ML ~~LOC~~ SOLN
120.0000 mg | Freq: Once | SUBCUTANEOUS | Status: AC
  Administered 2018-09-28: 120 mg via SUBCUTANEOUS
  Filled 2018-09-28: qty 120

## 2018-09-29 LAB — CHROMOGRANIN A: Chromogranin A (ng/mL): 238.2 ng/mL — ABNORMAL HIGH (ref 0.0–101.8)

## 2018-10-05 ENCOUNTER — Other Ambulatory Visit: Payer: Self-pay | Admitting: Cardiovascular Disease

## 2018-10-05 ENCOUNTER — Other Ambulatory Visit: Payer: Self-pay | Admitting: Family Medicine

## 2018-10-10 ENCOUNTER — Other Ambulatory Visit: Payer: Self-pay

## 2018-10-10 ENCOUNTER — Ambulatory Visit: Payer: Medicare Other

## 2018-10-10 ENCOUNTER — Ambulatory Visit (INDEPENDENT_AMBULATORY_CARE_PROVIDER_SITE_OTHER): Payer: Medicare Other | Admitting: Nurse Practitioner

## 2018-10-10 ENCOUNTER — Encounter: Payer: Self-pay | Admitting: Nurse Practitioner

## 2018-10-10 VITALS — BP 118/62 | HR 55 | Temp 97.7°F | Resp 14 | Ht 64.0 in | Wt 149.3 lb

## 2018-10-10 DIAGNOSIS — I10 Essential (primary) hypertension: Secondary | ICD-10-CM

## 2018-10-10 DIAGNOSIS — R809 Proteinuria, unspecified: Secondary | ICD-10-CM

## 2018-10-10 DIAGNOSIS — I700 Atherosclerosis of aorta: Secondary | ICD-10-CM

## 2018-10-10 DIAGNOSIS — I4892 Unspecified atrial flutter: Secondary | ICD-10-CM | POA: Diagnosis not present

## 2018-10-10 DIAGNOSIS — N401 Enlarged prostate with lower urinary tract symptoms: Secondary | ICD-10-CM

## 2018-10-10 DIAGNOSIS — E1129 Type 2 diabetes mellitus with other diabetic kidney complication: Secondary | ICD-10-CM | POA: Diagnosis not present

## 2018-10-10 DIAGNOSIS — E782 Mixed hyperlipidemia: Secondary | ICD-10-CM | POA: Diagnosis not present

## 2018-10-10 DIAGNOSIS — R3911 Hesitancy of micturition: Secondary | ICD-10-CM

## 2018-10-10 DIAGNOSIS — M8580 Other specified disorders of bone density and structure, unspecified site: Secondary | ICD-10-CM

## 2018-10-10 MED ORDER — ATORVASTATIN CALCIUM 40 MG PO TABS: ORAL_TABLET | ORAL | 1 refills | Status: DC

## 2018-10-10 MED ORDER — TAMSULOSIN HCL 0.4 MG PO CAPS: 0.4000 mg | ORAL_CAPSULE | Freq: Every day | ORAL | 1 refills | Status: DC

## 2018-10-10 MED ORDER — FINASTERIDE 5 MG PO TABS: 5.0000 mg | ORAL_TABLET | Freq: Every day | ORAL | 1 refills | Status: DC

## 2018-10-10 NOTE — Progress Notes (Signed)
Name: Jason Malone   MRN: [id number]    DOB: Dec 12, 1940   Date:10/10/2018       Progress Note  Subjective  Chief Complaint  Chief Complaint  Patient presents with  . Follow-up    HPI   Atrial Flutter Sees Dr. Rockey Situ, states it was caught after upper left lobectomy 5 years ago.   Hypertension Amlodipine 2.5mg , coreg 3.125 & losartan 100mg  Managed by cardiology  BP Readings from Last 3 Encounters:  10/10/18 118/62  08/31/18 124/78  06/12/18 (!) 116/58   Hyperlipidemia Atorvastatin 40mg , has atherosclerosis  Lab Results  Component Value Date   CHOL 124 04/10/2018   HDL 41 04/10/2018   LDLCALC 64 04/10/2018   TRIG 100 04/10/2018   CHOLHDL 3.0 04/10/2018   BPH Takes finesteride and tamsulosin was having trouble urinating and now he does not have to strain or push, and moderate flow.   Prediabetes Patient is taking lanreotide  Lab Results  Component Value Date   HGBA1C 6.5 (H) 04/10/2018   Osteopenia Takes vitamin D and calcium supplements  Stage IV neuroendocrine tumor of the lung metastasis to the liver Follows up with Dr. Grayland Ormond and Dr. Tasia Catchings, getting subcutaneous lanreotide every 4 weeks.   PHQ2/9: Depression screen [facility] 2/9 10/10/2018 04/24/2018 04/10/2018 10/04/2017 02/14/2017  Decreased Interest 0 0 0 0 0  Down, Depressed, Hopeless 0 0 0 0 0  PHQ - 2 Score 0 0 0 0 0  Altered sleeping 0 - 0 - -  Tired, decreased energy 0 - 0 - -  Change in appetite 0 - 0 - -  Feeling bad or failure about yourself  0 - 0 - -  Trouble concentrating 0 - 0 - -  Moving slowly or fidgety/restless 0 - 0 - -  Suicidal thoughts 0 - 0 - -  PHQ-9 Score 0 - 0 - -  Difficult doing work/chores Not difficult at all - Not difficult at all - -     PHQ reviewed. Negative  Patient Active Problem List   Diagnosis Date Noted  . Microalbuminuria due to type 2 diabetes mellitus ([place]) 04/12/2018  . Type 2 diabetes mellitus ([place]) 04/10/2018  . Osteopenia determined by x-ray 01/07/2017  .  Neuroendocrine carcinoma of lung ([place]) 12/28/2016  . Low back pain 12/17/2016  . Liver mass, left lobe 08/28/2016  . 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 History of lung cancer 06/29/2016  . Aortic atherosclerosis ([place]) 04/06/2016  . Coronary artery disease involving native heart without angina pectoris 04/06/2016  . History of colonic polyps 10/28/2015  . Medication monitoring encounter 08/15/2015  . Paroxysmal atrial flutter ([place])   . Elevated serum alkaline phosphatase level 03/05/2015  . Allergic rhinitis   . BPH without urinary obstruction   . Hyperlipidemia 02/13/2014  . Sinus bradycardia 07/18/2013  . S/P lobectomy of lung 06/04/2013  . Atrial flutter ([place]) 04/09/2013  . Smoking history 04/09/2013  . Essential hypertension 04/09/2013  . Rectal bleeding 12/15/2012    Past Medical History:  Diagnosis Date  . Allergic rhinitis   . Benign prostatic hypertrophy with lower urinary tract symptoms (LUTS)   . Chest pain    a. 05/2013 MV: EF 70%, no ischemia.  . Diverticulitis    DIVERTICULOSIS  . Enlarged prostate   . Essential hypertension    CONTROLLED ON MEDS  . Full dentures    upper and lower  . H/O hypokalemia   . Hx of atrial fibrillation without current  medication    CARDIOLOGIST-DR [facility]  . 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 Hyperlipemia   . Hypomagnesemia   . IFG (impaired fasting glucose)   . Liver cancer ([place])   . Liver cancer ([place]) 12/08/2016  . Liver mass, left lobe 08/28/2016   Probably hemangioma; will get MRI of liver, refer to GI  . Lung cancer ([place])    a. carcinoid, left lung, Stage 1b (T2a, N0, cM0);  b. 05/2013 s/p VATS & LULobectomy.  . Monocytosis   . Osteopenia   . Paroxysmal atrial flutter ([facility])    a. 03/2013->no recurrence;  b. CHA2DS2VASc = 2-->not currently on anticoagulation;  c. 05/2012 Echo: EF 55-60%, normal RV.  [name] Wears glasses     Past Surgical History:  Procedure  Laterality Date  . BRONCHOSCOPY     04/06/2013  . CARDIOVASCULAR STRESS TEST  05/2013   a. No evidence of ischemia or infarct, EF 70%, no WMAs  . CATARACT EXTRACTION W/PHACO Left 07/14/2015   Procedure: CATARACT EXTRACTION PHACO AND INTRAOCULAR LENS PLACEMENT (IOC);  Surgeon: [name], MD;  Location: [facility];  Service: Ophthalmology;  Laterality: Left;  . CATARACT EXTRACTION W/PHACO Right 10/01/2015   Procedure: CATARACT EXTRACTION PHACO AND INTRAOCULAR LENS PLACEMENT (IOC);  Surgeon: [name], MD;  Location: [place];  Service: Ophthalmology;  Laterality: Right;  . COLONOSCOPY W/ POLYPECTOMY     bleed after-had to go to surgery to stop bleeding via colonoscopy  . COLONOSCOPY WITH PROPOFOL N/A 11/19/2015   Procedure: COLONOSCOPY WITH PROPOFOL;  Surgeon: [name], MD;  [facility];  Service: Endoscopy;  Laterality: N/A;  . DUPUYTREN CONTRACTURE RELEASE  12/15/2011   Procedure: DUPUYTREN CONTRACTURE RELEASE;  Surgeon: [name], MD;  Location: [place] [facility];  Service: Orthopedics;  Laterality: Left;  Fasciotomy left ring finger dupuytrens  . IR ANGIOGRAM SELECTIVE EACH ADDITIONAL VESSEL  07/26/2017  . IR ANGIOGRAM SELECTIVE EACH ADDITIONAL VESSEL  07/26/2017  . IR ANGIOGRAM SELECTIVE EACH ADDITIONAL VESSEL  07/26/2017  . IR ANGIOGRAM SELECTIVE EACH ADDITIONAL VESSEL  07/26/2017  . IR ANGIOGRAM SELECTIVE EACH ADDITIONAL VESSEL  07/26/2017  . IR ANGIOGRAM SELECTIVE EACH ADDITIONAL VESSEL  08/11/2017  . IR ANGIOGRAM SELECTIVE EACH ADDITIONAL VESSEL  04/28/2018  . IR ANGIOGRAM VISCERAL SELECTIVE  07/26/2017  . IR ANGIOGRAM VISCERAL SELECTIVE  08/11/2017  . IR ANGIOGRAM VISCERAL SELECTIVE  04/28/2018  . IR EMBO ARTERIAL NOT HEMORR HEMANG INC GUIDE ROADMAPPING  07/26/2017  . IR EMBO TUMOR ORGAN ISCHEMIA INFARCT INC GUIDE ROADMAPPING  08/11/2017  . IR EMBO TUMOR ORGAN ISCHEMIA INFARCT INC GUIDE ROADMAPPING  04/28/2018  . IR RADIOLOGIST  EVAL & MGMT  07/07/2017  . IR RADIOLOGIST EVAL & MGMT  09/07/2017  . IR RADIOLOGIST EVAL & MGMT  12/21/2017  . IR RADIOLOGIST EVAL & MGMT  03/22/2018  . IR RADIOLOGIST EVAL & MGMT  06/08/2018  . IR US GUIDE VASC ACCESS RIGHT  07/26/2017  . IR US GUIDE VASC ACCESS RIGHT  08/11/2017  . IR US GUIDE VASC ACCESS RIGHT  04/28/2018  . LUNG LOBECTOMY  06/10/13   upper left lung  . MULTIPLE TOOTH EXTRACTIONS    . REMOVAL RETAINED LENS Right 11/17/2015   Procedure: REMOVAL RETAINED LENS FROAGMENTS RIGHT EYE;  Surgeon: [name], MD;  Location: [place];  Service: Ophthalmology;  Laterality: Right;  . TONSILLECTOMY    . UPPER GASTROINTESTINAL ENDOSCOPY  11-03-15   Dr [name]  .  VASECTOMY    . VIDEO ASSISTED THORACOSCOPY (VATS)/WEDGE RESECTION Left 06/04/2013   Procedure: VIDEO ASSISTED THORACOSCOPY (VATS)/WEDGE RESECTION;  Surgeon: [name], MD;  Location: [place];  Service: Thoracic;  Laterality: Left;  [name] VIDEO BRONCHOSCOPY N/A 06/04/2013   Procedure: VIDEO BRONCHOSCOPY;  Surgeon: [name], MD;  Location: [facility] OR;  Service: Thoracic;  Laterality: N/A;    Social History   Tobacco Use  . Smoking status: Former Smoker    Packs/day: 1.00    Years: 40.00    Pack years: 40.00    Types: Cigarettes    Last attempt to quit: 12/09/1998    Years since quitting: 19.8  . Smokeless tobacco: Never Used  Substance Use Topics  . Alcohol use: No     Current Outpatient Medications:  .  amLODipine (NORVASC) 2.5 MG tablet, Take 1 tablet by mouth once daily, Disp: 90 tablet, Rfl: 0 .  atorvastatin (LIPITOR) 40 MG tablet, TAKE 1 TABLET BY MOUTH ONCE DAILY FOR CHOLESTEROL, Disp: 90 tablet, Rfl: 1 .  b complex vitamins capsule, Take 1 capsule by mouth daily. AM, Disp: , Rfl:  .  Calcium Carbonate-Vitamin D3 (CALCIUM 600/VITAMIN D) 600-400 MG-UNIT TABS, Take by mouth daily., Disp: , Rfl:  .  carvedilol (COREG) 3.125 MG tablet, TAKE 1 TABLET BY MOUTH ONCE DAILY IN THE MORNING, Disp: 90  tablet, Rfl: 3 .  Cinnamon 500 MG capsule, Take 500 mg by mouth daily. AM AND BEDTIME, Disp: , Rfl:  .  fexofenadine (ALLEGRA) 180 MG tablet, Take 180 mg by mouth daily., Disp: , Rfl:  .  finasteride (PROSCAR) 5 MG tablet, Take 1 tablet by mouth once daily, Disp: 90 tablet, Rfl: 0 .  LANREOTIDE ACETATE [place], Inject 1 Dose into the skin every 30 (thirty) days., Disp: , Rfl:  .  losartan (COZAAR) 100 MG tablet, TAKE 1 TABLET BY MOUTH ONCE DAILY, Disp: 90 tablet, Rfl: 3 .  Multiple Vitamins-Minerals (MULTIVITAMIN WITH MINERALS) tablet, Take 1 tablet by mouth daily. AM, Disp: , Rfl:  .  polycarbophil (FIBERCON) 625 MG tablet, Take 625 mg by mouth daily. AM, Disp: , Rfl:  .  polyethylene glycol (MIRALAX / GLYCOLAX) packet, Take 17 g by mouth daily as needed. , Disp: , Rfl:  .  tamsulosin (FLOMAX) 0.4 MG CAPS capsule, TAKE 1 CAPSULE BY MOUTH ONCE DAILY, Disp: 90 capsule, Rfl: 3 .  vitamin C (ASCORBIC ACID) 500 MG tablet, Take 500 mg by mouth daily. AM, Disp: , Rfl:  .  glucose blood (ONETOUCH VERIO) test strip, Check fingerstick blood sugars twice a day; LON 99 months; Dx E11.9, Disp: 200 each, Rfl: 3 .  ONETOUCH DELICA LANCETS 35W MISC, Check fingerstick blood sugars twice a day; LON 99 months; Dx E11.9, Disp: 200 each, Rfl: 3  Allergies  Allergen Reactions  . Latex Itching    Tape only- REACTED TO BANDAGE ON ABDOMEN  . Tape Itching    Surgical tapes     Review of Systems  Constitutional: Negative for chills, fever and malaise/fatigue.  HENT: Negative for congestion, sinus pain and sore throat.   Eyes: Negative for blurred vision.  Respiratory: Negative for cough and shortness of breath.   Cardiovascular: Negative for chest pain, palpitations and leg swelling.  Gastrointestinal: Negative for abdominal pain, blood in stool, constipation, diarrhea and nausea.  Genitourinary: Negative for dysuria and frequency.  Musculoskeletal: Negative for falls and joint pain.  Skin: Negative for rash.   Neurological: Negative for dizziness and headaches.  Endo/Heme/Allergies: Negative  for polydipsia.  Psychiatric/Behavioral: The patient is not nervous/anxious and does not have insomnia.      No other specific complaints in a complete review of systems (except as listed in HPI above).  Objective  Vitals:   10/10/18 1037  BP: 118/62  Pulse: (!) 55  Resp: 14  Temp: 97.7 F (36.5 C)  TempSrc: Oral  SpO2: 97%  Weight: 149 lb 4.8 oz (67.7 kg)  Height: 5\' 4"  (1.626 m)     Body mass index is 25.63 kg/m.  Nursing Note and Vital Signs reviewed.  Physical Exam Vitals signs reviewed.  Constitutional:      Appearance: He is well-developed.  HENT:     Head: Normocephalic and atraumatic.  Neck:     Musculoskeletal: Normal range of motion and neck supple.     Vascular: No carotid bruit.  Cardiovascular:     Heart sounds: Normal heart sounds.  Pulmonary:     Effort: Pulmonary effort is normal.     Breath sounds: Normal breath sounds.  Abdominal:     General: Bowel sounds are normal.     Palpations: Abdomen is soft.     Tenderness: There is no abdominal tenderness.  Musculoskeletal: Normal range of motion.  Skin:    General: Skin is warm and dry.     Capillary Refill: Capillary refill takes less than 2 seconds.  Neurological:     Mental Status: He is alert and oriented to person, place, and time.     GCS: GCS eye subscore is 4. GCS verbal subscore is 5. GCS motor subscore is 6.     Sensory: No sensory deficit.  Psychiatric:        Speech: Speech normal.        Behavior: Behavior normal.        Thought Content: Thought content normal.        Judgment: Judgment normal.        No results found for this or any previous visit (from the past 48 hour(s)).  Assessment & Plan  1. Atrial flutter, unspecified type [facility]) Not currently in, paroxymal   2. Essential hypertension Well controlled   3. Aortic atherosclerosis (HCC) LDL under 70, good bp and lipid contorl    4. Microalbuminuria due to type 2 diabetes mellitus (HCC) - HgB A1c  5. Mixed hyperlipidemia - atorvastatin (LIPITOR) 40 MG tablet; TAKE 1 TABLET BY MOUTH ONCE DAILY FOR CHOLESTEROL  Dispense: 90 tablet; Refill: 1 - Lipid Profile  6. Osteopenia determined by x-ray Vitamin D and calcium supplementation   7. Benign prostatic hyperplasia with urinary hesitancy Stable  - finasteride (PROSCAR) 5 MG tablet; Take 1 tablet (5 mg total) by mouth daily.  Dispense: 90 tablet; Refill: 1 - tamsulosin (FLOMAX) 0.4 MG CAPS capsule; Take 1 capsule (0.4 mg total) by mouth daily.  Dispense: 90 capsule; Refill: 1

## 2018-10-11 ENCOUNTER — Telehealth: Payer: Self-pay | Admitting: Family Medicine

## 2018-10-11 ENCOUNTER — Other Ambulatory Visit: Payer: Self-pay | Admitting: Orthopedic Surgery

## 2018-10-11 DIAGNOSIS — M720 Palmar fascial fibromatosis [Dupuytren]: Secondary | ICD-10-CM | POA: Diagnosis not present

## 2018-10-11 DIAGNOSIS — R2231 Localized swelling, mass and lump, right upper limb: Secondary | ICD-10-CM | POA: Diagnosis not present

## 2018-10-11 LAB — LIPID PANEL
Cholesterol: 96 mg/dL (ref <200)
HDL: 40 mg/dL (ref >=40)
LDL Cholesterol (Calc): 38 mg/dL (calc)
Non-HDL Cholesterol (Calc): 56 mg/dL (calc) (ref <130)
Total CHOL/HDL Ratio: 2.4 (calc) (ref <5.0)
Triglycerides: 95 mg/dL (ref <150)

## 2018-10-11 LAB — HEMOGLOBIN A1C
Hgb A1c MFr Bld: 6.3 % of total Hgb — ABNORMAL HIGH (ref <5.7)
Mean Plasma Glucose: 134 (calc)
eAG (mmol/L): 7.4 (calc)

## 2018-10-11 NOTE — Telephone Encounter (Signed)
@  LOGO@ Chronic Care Management   Outreach Note  10/11/2018 Name: Jason Malone MRN: [id number] DOB: 03/13/1941  Referred by: Arnetha Courser, MD Reason for referral : Chronic Care Management (Initial CCM outreach call was unsuccessful)   An unsuccessful telephone outreach was attempted today. The patient was referred to the case management team by for assistance with chronic care management and care coordination.   Follow Up Plan: The care management team will reach out to the patient again over the next 7 days.   [place]  [email or web address]   ??[phone]

## 2018-10-17 NOTE — Chronic Care Management (AMB) (Signed)
°  Chronic Care Management   Outreach Note  10/17/2018 Name: Jason Malone MRN: [id number] DOB: [birth year]  Referred by: Arnetha Courser, MD Reason for referral : Chronic Care Management (Initial CCM outreach call was unsuccessful) and Chronic Care Management (Second CCM outreach call was unsuccessful. )   Third unsuccessful telephone outreach was attempted today. The patient was referred to the case management team for assistance with chronic care management and care coordination. The patient's primary care provider has been notified of our unsuccessful attempts to make or maintain contact with the patient. The care management team is pleased to engage with this patient at any time in the future should he/she be interested in assistance from the care management team.   Follow Up Plan: The care management team is available to follow up with the patient after provider conversation with the patient regarding recommendation for care management engagement and subsequent re-referral to the care management team.   Luis Lopez  [email or web address]   ??[phone]

## 2018-10-25 ENCOUNTER — Other Ambulatory Visit: Payer: Self-pay | Admitting: Interventional Radiology

## 2018-10-25 ENCOUNTER — Telehealth: Payer: Self-pay | Admitting: Oncology

## 2018-10-25 DIAGNOSIS — C7B8 Other secondary neuroendocrine tumors: Secondary | ICD-10-CM

## 2018-10-25 DIAGNOSIS — C7A8 Other malignant neuroendocrine tumors: Secondary | ICD-10-CM

## 2018-10-25 NOTE — Telephone Encounter (Signed)
Left msg with relative Jason Malone about screening and new guidelines about mask req, no visitors, screening questions they will be asked, and fever checks

## 2018-10-26 ENCOUNTER — Other Ambulatory Visit: Payer: Self-pay

## 2018-10-26 ENCOUNTER — Inpatient Hospital Stay: Payer: Medicare Other | Attending: Oncology

## 2018-10-26 ENCOUNTER — Inpatient Hospital Stay: Payer: Medicare Other

## 2018-10-26 VITALS — BP 131/74 | HR 62 | Resp 18

## 2018-10-26 DIAGNOSIS — C7A8 Other malignant neuroendocrine tumors: Secondary | ICD-10-CM

## 2018-10-26 DIAGNOSIS — C7B02 Secondary carcinoid tumors of liver: Secondary | ICD-10-CM | POA: Insufficient documentation

## 2018-10-26 DIAGNOSIS — Z79899 Other long term (current) drug therapy: Secondary | ICD-10-CM | POA: Insufficient documentation

## 2018-10-26 DIAGNOSIS — C7A090 Malignant carcinoid tumor of the bronchus and lung: Secondary | ICD-10-CM | POA: Insufficient documentation

## 2018-10-26 MED ORDER — LANREOTIDE ACETATE 120 MG/0.5ML ~~LOC~~ SOLN
120.0000 mg | Freq: Once | SUBCUTANEOUS | Status: AC
  Administered 2018-10-26: 120 mg via SUBCUTANEOUS
  Filled 2018-10-26: qty 120

## 2018-10-31 ENCOUNTER — Other Ambulatory Visit: Payer: Self-pay

## 2018-10-31 ENCOUNTER — Encounter (HOSPITAL_BASED_OUTPATIENT_CLINIC_OR_DEPARTMENT_OTHER): Payer: Self-pay | Admitting: *Deleted

## 2018-10-31 LAB — CHROMOGRANIN A: Chromogranin A (ng/mL): 238.2 ng/mL — ABNORMAL HIGH (ref 0.0–101.8)

## 2018-11-01 ENCOUNTER — Ambulatory Visit
Admission: RE | Admit: 2018-11-01 | Discharge: 2018-11-01 | Disposition: A | Discharge status: Home / Self Care | Payer: Medicare Other | Source: Ambulatory Visit | Attending: Interventional Radiology | Admitting: Interventional Radiology

## 2018-11-01 ENCOUNTER — Encounter: Payer: Self-pay | Admitting: *Deleted

## 2018-11-01 DIAGNOSIS — C7A8 Other malignant neuroendocrine tumors: Secondary | ICD-10-CM | POA: Diagnosis not present

## 2018-11-01 DIAGNOSIS — C7B8 Other secondary neuroendocrine tumors: Secondary | ICD-10-CM

## 2018-11-01 DIAGNOSIS — Z9889 Other specified postprocedural states: Secondary | ICD-10-CM | POA: Diagnosis not present

## 2018-11-01 HISTORY — PX: IR RADIOLOGIST EVAL & MGMT: IMG5224

## 2018-11-01 NOTE — Progress Notes (Addendum)
Patient ID: Jason Malone, male   DOB: 03/20/1941, 78 y.o.   MRN: [id number]      Chief Complaint:  Follow-up Y 90 radioembolization   Referring Physician(s): Finnegan   History of Present Illness: Jason Malone is a 78 y.o. male with stage IV metastatic neuroendocrine tumor to the liver.  He is now 25-month status post most recent right hepatic Y 90 radioembolization.  Previously, he also had a left hepatic Y 90 embolization.  So now, he is status post whole liver treatment.  Overall he is doing very well.  Excellent functional status.  He continues to walk daily and do his yard work.  Stable weight and appetite.  No abdominal pain, flank pain, weakness, fatigue or signs of liver failure.  No significant diarrhea symptoms or flushing syndrome.  He continues on monthly Sandostatin injections.  No new complaints.  Past Medical History:  Diagnosis Date  . Allergic rhinitis   . Benign prostatic hypertrophy with lower urinary tract symptoms (LUTS)   . Chest pain    a. 05/2013 MV: EF 70%, no ischemia.  . Diverticulitis    DIVERTICULOSIS  . Enlarged prostate   . Essential hypertension    CONTROLLED ON MEDS  . Full dentures    upper and lower  . H/O hypokalemia   . Hx of atrial fibrillation without current medication    CARDIOLOGIST-DR [facility]  . 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 Hyperlipemia   . Hypomagnesemia   . IFG (impaired fasting glucose)   . Liver cancer ([place])   . Liver cancer ([place]) 12/08/2016  . Liver mass, left lobe 08/28/2016   Probably hemangioma; will get MRI of liver, refer to GI  . Lung cancer ([place])    a. carcinoid, left lung, Stage 1b (T2a, N0, cM0);  b. 05/2013 s/p VATS & LULobectomy.  . Monocytosis   . Osteopenia   . Paroxysmal atrial flutter ([facility])    a. 03/2013->no recurrence;  b. CHA2DS2VASc = 2-->not currently on anticoagulation;  c. 05/2012 Echo: EF 55-60%, normal RV.  [name] Wears glasses     Past Surgical History:   Procedure Laterality Date  . BRONCHOSCOPY     04/06/2013  . CARDIOVASCULAR STRESS TEST  05/2013   a. No evidence of ischemia or infarct, EF 70%, no WMAs  . CATARACT EXTRACTION W/PHACO Left 07/14/2015   Procedure: CATARACT EXTRACTION PHACO AND INTRAOCULAR LENS PLACEMENT (IOC);  Surgeon: [name], MD;  Location: [place];  Service: Ophthalmology;  Laterality: Left;  . CATARACT EXTRACTION W/PHACO Right 10/01/2015   Procedure: CATARACT EXTRACTION PHACO AND INTRAOCULAR LENS PLACEMENT (IOC);  Surgeon: [name], MD;  Location: [place];  Service: Ophthalmology;  Laterality: Right;  . COLONOSCOPY W/ POLYPECTOMY     bleed after-had to go to surgery to stop bleeding via colonoscopy  . COLONOSCOPY WITH PROPOFOL N/A 11/19/2015   Procedure: COLONOSCOPY WITH PROPOFOL;  Surgeon: [name], MD;  Location: [facility] END[facility]COPY;  Service: Endoscopy;  Laterality: N/A;  . DUPUYTREN CONTRACTURE RELEASE  12/15/2011   Procedure: DUPUYTREN CONTRACTURE RELEASE;  Surgeon: [name], MD;  Location: [place];  Service: Orthopedics;  Laterality: Left;  Fasciotomy left ring finger dupuytrens  . IR ANGIOGRAM SELECTIVE EACH ADDITIONAL VESSEL  07/26/2017  . IR ANGIOGRAM SELECTIVE EACH ADDITIONAL VESSEL  07/26/2017  . IR ANGIOGRAM SELECTIVE EACH ADDITIONAL VESSEL  07/26/2017  . IR ANGIOGRAM SELECTIVE EACH ADDITIONAL VESSEL  07/26/2017  . IR ANGIOGRAM SELECTIVE EACH ADDITIONAL VESSEL  07/26/2017  . IR ANGIOGRAM SELECTIVE EACH ADDITIONAL VESSEL  08/11/2017  . IR ANGIOGRAM SELECTIVE EACH ADDITIONAL VESSEL  04/28/2018  . IR ANGIOGRAM VISCERAL SELECTIVE  07/26/2017  . IR ANGIOGRAM VISCERAL SELECTIVE  08/11/2017  . IR ANGIOGRAM VISCERAL SELECTIVE  04/28/2018  . IR EMBO ARTERIAL NOT HEMORR HEMANG INC GUIDE ROADMAPPING  07/26/2017  . IR EMBO TUMOR ORGAN ISCHEMIA INFARCT INC GUIDE ROADMAPPING  08/11/2017  . IR EMBO TUMOR ORGAN ISCHEMIA INFARCT INC GUIDE ROADMAPPING  04/28/2018  . IR  RADIOLOGIST EVAL & MGMT  07/07/2017  . IR RADIOLOGIST EVAL & MGMT  09/07/2017  . IR RADIOLOGIST EVAL & MGMT  12/21/2017  . IR RADIOLOGIST EVAL & MGMT  03/22/2018  . IR RADIOLOGIST EVAL & MGMT  06/08/2018  . IR RADIOLOGIST EVAL & MGMT  11/01/2018  . IR US GUIDE VASC ACCESS RIGHT  07/26/2017  . IR US GUIDE VASC ACCESS RIGHT  08/11/2017  . IR US GUIDE VASC ACCESS RIGHT  04/28/2018  . LUNG LOBECTOMY  06/10/13   upper left lung  . MULTIPLE TOOTH EXTRACTIONS    . REMOVAL RETAINED LENS Right 11/17/2015   Procedure: REMOVAL RETAINED LENS FROAGMENTS RIGHT EYE;  Surgeon: [name], MD;  Location: [place];  Service: Ophthalmology;  Laterality: Right;  . TONSILLECTOMY    . UPPER GASTROINTESTINAL ENDOSCOPY  11-03-15   Dr [name]  . VASECTOMY    . VIDEO ASSISTED THORACOSCOPY (VATS)/WEDGE RESECTION Left 06/04/2013   Procedure: VIDEO ASSISTED THORACOSCOPY (VATS)/WEDGE RESECTION;  Surgeon: [name], MD;  Location: [place];  Service: Thoracic;  Laterality: Left;  [name] VIDEO BRONCHOSCOPY N/A 06/04/2013   Procedure: VIDEO BRONCHOSCOPY;  Surgeon: [name], MD;  Location: [facility] OR;  Service: Thoracic;  Laterality: N/A;    Allergies: Latex and Tape  Medications: Prior to Admission medications   Medication Sig Start Date End Date Taking? Authorizing Provider  amLODipine (NORVASC) 2.5 MG tablet Take 1 tablet by mouth once daily 10/05/18   Minna Merritts, MD  atorvastatin (LIPITOR) 40 MG tablet TAKE 1 TABLET BY MOUTH ONCE DAILY FOR CHOLESTEROL 10/10/18   Poulose, Bethel Born, NP  b complex vitamins capsule Take 1 capsule by mouth daily. AM    [provider]  Calcium Carbonate-Vitamin D3 (CALCIUM 600/VITAMIN D) 600-400 MG-UNIT TABS Take by mouth daily.    [provider]  carvedilol (COREG) 3.125 MG tablet TAKE 1 TABLET BY MOUTH ONCE DAILY IN THE MORNING 09/11/18   Gollan, Kathlene November, MD  Cinnamon 500 MG capsule Take 500 mg by mouth daily. AM AND BEDTIME    [provider]  fexofenadine (ALLEGRA) 180 MG tablet Take 180 mg by mouth daily.    [provider]  finasteride (PROSCAR) 5 MG tablet Take 1 tablet (5 mg total) by mouth daily. 10/10/18   Poulose, Bethel Born, NP  LANREOTIDE ACETATE [place] Inject 1 Dose into the skin every 30 (thirty) days.    [provider]  losartan (COZAAR) 100 MG tablet TAKE 1 TABLET BY MOUTH ONCE DAILY 12/19/17   Minna Merritts, MD  Multiple Vitamins-Minerals (MULTIVITAMIN WITH MINERALS) tablet Take 1 tablet by mouth daily. AM    [provider]  polycarbophil (FIBERCON) 625 MG tablet Take 625 mg by mouth daily. AM    [provider]  polyethylene glycol (MIRALAX / GLYCOLAX) packet Take 17 g by mouth daily as needed.     [provider]  tamsulosin (FLOMAX) 0.4 MG CAPS capsule Take 1 capsule (0.4 mg total) by mouth daily. 10/10/18   Poulose, Bethel Born, NP  vitamin C (ASCORBIC ACID) 500 MG tablet Take 500 mg by mouth daily. AM    [provider]     Family History  Problem Relation Age of Onset  . Stroke Mother   . Alzheimer's disease Mother   . Hypertension Sister   . Hyperlipidemia Sister   . Hypertension Brother   . Hyperlipidemia Brother   . Diabetes Brother     Social History   Socioeconomic History  . Marital status: Married    Spouse name: [name]  . Number of children: 3  . Years of education: Not on file  . Highest education level: High school graduate  Occupational History  . Not on file  Social Needs  . Financial resource strain: Not hard at all  . Food insecurity    Worry: Never true    Inability: Never true  . Transportation needs    Medical: No    Non-medical: No  Tobacco Use  . Smoking status: Former Smoker    Packs/day: 1.00    Years: 40.00    Pack years: 40.00    Types: Cigarettes    Quit date: 12/09/1998    Years since quitting: 19.9  . Smokeless tobacco: Never Used  Substance and Sexual Activity  . Alcohol use: No  . Drug use:  No  . Sexual activity: Yes    Partners: Female  Lifestyle  . Physical activity    Days per week: 7 days    Minutes per session: 60 min  . Stress: Not at all  Relationships  . Social connections    Talks on phone: More than three times a week    Gets together: More than three times a week    Attends religious service: Never    Active member of club or organization: No    Attends meetings of clubs or organizations: Never    Relationship status: Married  Other Topics Concern  . Not on file  Social History Narrative  . Not on file    ECOG Status: 1 - Symptomatic but completely ambulatory  Review of Systems: A 12 point ROS discussed and pertinent positives are indicated in the HPI above.  All other systems are negative.  Review of Systems  Vital Signs: There were no vitals taken for this visit.  Physical Exam  Exam deferred today because this is a tele visit due to the COVID-19 pandemic.  Imaging: Ir Radiologist Eval & Mgmt  Result Date: 11/01/2018 Please refer to notes tab for details about interventional procedure. (Op Note)   Labs:  CBC: Recent Labs    07/06/18 1045 08/03/18 1058 08/31/18 0937 09/28/18 1024  WBC 7.6 6.9 7.8 7.8  HGB 15.6 15.8 15.1 15.0  HCT 47.0 48.0 44.8 44.8  PLT 146* 159 159 158    COAGS: Recent Labs    04/28/18 0750  INR 0.91    BMP: Recent Labs    07/06/18 1045 08/03/18 1058 08/31/18 0937 09/28/18 1024  NA 139 140 137 138  K 4.2 4.3 3.9 4.0  CL 105 105 106 105  CO2 28 28 25 25   GLUCOSE 107* 113* 167* 136*  BUN 19 20 19 20   CALCIUM 9.5 9.7 9.2 9.5  CREATININE 0.97 1.10 0.89 0.93  GFRNONAA >60 >60 >60 >60  GFRAA >60 >60 >60 >60    LIVER FUNCTION TESTS: Recent Labs  07/06/18 1045 08/03/18 1058 08/31/18 0937 09/28/18 1024  BILITOT 0.8 0.8 0.9 0.9  AST 37 37 34 33  ALT 49* 41 34 36  ALKPHOS 178* 163* 194* 193*  PROT 7.4 7.2 7.0 7.0  ALBUMIN 4.2 4.2 3.9 4.1    TUMOR MARKERS: Recent Labs    03/16/18  1033 04/28/18 0832 05/11/18 1053 07/06/18 1127  CHROMGRNA 5 4 5 3     Assessment and Plan:  54-month status post right hepatic Y 90 embolization for metastatic neuroendocrine tumor to the liver.  He is now status post whole liver treatment.  Imaging demonstrates decrease in size of his lesions.  No new hepatic disease.  Overall he is at his baseline.  Plan: Continue monthly Sandostatin injections.  Repeat MRI in 4 months (September 2020)  Thank you for this interesting consult.  I greatly enjoyed meeting Jason Malone and look forward to participating in their care.  A copy of this report was sent to the requesting provider on this date.  Electronically Signed: Greggory Keen, MD 11/01/2018, 9:10 AM   I spent a total of    15 Minutes in teleconference clinical consultation, greater than 50% of which was counseling/coordinating care for this patient with metastatic neuroendocrine tumor to the liver.   Visit type:  Audio only(telephone). Audio(no video) only due to inability to coordinate video conference.  Alternative for in person consultation at [facility], [place], [place], [place].  This type of visit was conducted due to national recs for restrictions regarding the covid 19 pandemic(e.g. social distancing). This format is felt to be most appropriate for this patient at this time.  All issues noted in this document were discussed and addressed.

## 2018-11-03 ENCOUNTER — Other Ambulatory Visit (HOSPITAL_COMMUNITY)
Admission: RE | Admit: 2018-11-03 | Discharge: 2018-11-03 | Disposition: A | Discharge status: Home / Self Care | Payer: Medicare Other | Source: Ambulatory Visit | Attending: Orthopedic Surgery | Admitting: Orthopedic Surgery

## 2018-11-03 DIAGNOSIS — Z1159 Encounter for screening for other viral diseases: Secondary | ICD-10-CM | POA: Diagnosis not present

## 2018-11-03 LAB — SARS CORONAVIRUS 2 (TAT 6-24 HRS): SARS Coronavirus 2: NEGATIVE

## 2018-11-07 ENCOUNTER — Ambulatory Visit (HOSPITAL_BASED_OUTPATIENT_CLINIC_OR_DEPARTMENT_OTHER): Payer: Medicare Other | Admitting: Anesthesiology

## 2018-11-07 ENCOUNTER — Encounter (HOSPITAL_BASED_OUTPATIENT_CLINIC_OR_DEPARTMENT_OTHER)
Admission: RE | Disposition: A | Discharge status: Home / Self Care | Payer: Self-pay | Source: Home / Self Care | Attending: Orthopedic Surgery

## 2018-11-07 ENCOUNTER — Encounter (HOSPITAL_BASED_OUTPATIENT_CLINIC_OR_DEPARTMENT_OTHER): Payer: Self-pay

## 2018-11-07 ENCOUNTER — Other Ambulatory Visit: Payer: Self-pay

## 2018-11-07 ENCOUNTER — Ambulatory Visit (HOSPITAL_BASED_OUTPATIENT_CLINIC_OR_DEPARTMENT_OTHER)
Admission: RE | Admit: 2018-11-07 | Discharge: 2018-11-07 | Disposition: A | Discharge status: Home / Self Care | Payer: Medicare Other | Attending: Orthopedic Surgery | Admitting: Orthopedic Surgery

## 2018-11-07 DIAGNOSIS — M8580 Other specified disorders of bone density and structure, unspecified site: Secondary | ICD-10-CM | POA: Insufficient documentation

## 2018-11-07 DIAGNOSIS — Z85118 Personal history of other malignant neoplasm of bronchus and lung: Secondary | ICD-10-CM | POA: Diagnosis not present

## 2018-11-07 DIAGNOSIS — Z833 Family history of diabetes mellitus: Secondary | ICD-10-CM | POA: Insufficient documentation

## 2018-11-07 DIAGNOSIS — I10 Essential (primary) hypertension: Secondary | ICD-10-CM | POA: Insufficient documentation

## 2018-11-07 DIAGNOSIS — E785 Hyperlipidemia, unspecified: Secondary | ICD-10-CM | POA: Diagnosis not present

## 2018-11-07 DIAGNOSIS — Z9841 Cataract extraction status, right eye: Secondary | ICD-10-CM | POA: Diagnosis not present

## 2018-11-07 DIAGNOSIS — M720 Palmar fascial fibromatosis [Dupuytren]: Secondary | ICD-10-CM | POA: Diagnosis not present

## 2018-11-07 DIAGNOSIS — I4892 Unspecified atrial flutter: Secondary | ICD-10-CM | POA: Diagnosis not present

## 2018-11-07 DIAGNOSIS — Z8505 Personal history of malignant neoplasm of liver: Secondary | ICD-10-CM | POA: Insufficient documentation

## 2018-11-07 DIAGNOSIS — E876 Hypokalemia: Secondary | ICD-10-CM | POA: Insufficient documentation

## 2018-11-07 DIAGNOSIS — Z8249 Family history of ischemic heart disease and other diseases of the circulatory system: Secondary | ICD-10-CM | POA: Diagnosis not present

## 2018-11-07 DIAGNOSIS — I2510 Atherosclerotic heart disease of native coronary artery without angina pectoris: Secondary | ICD-10-CM | POA: Diagnosis not present

## 2018-11-07 DIAGNOSIS — Z91040 Latex allergy status: Secondary | ICD-10-CM | POA: Diagnosis not present

## 2018-11-07 DIAGNOSIS — Z820 Family history of epilepsy and other diseases of the nervous system: Secondary | ICD-10-CM | POA: Insufficient documentation

## 2018-11-07 DIAGNOSIS — I4891 Unspecified atrial fibrillation: Secondary | ICD-10-CM | POA: Insufficient documentation

## 2018-11-07 DIAGNOSIS — Z87891 Personal history of nicotine dependence: Secondary | ICD-10-CM | POA: Diagnosis not present

## 2018-11-07 DIAGNOSIS — Z9842 Cataract extraction status, left eye: Secondary | ICD-10-CM | POA: Diagnosis not present

## 2018-11-07 DIAGNOSIS — N401 Enlarged prostate with lower urinary tract symptoms: Secondary | ICD-10-CM | POA: Diagnosis not present

## 2018-11-07 DIAGNOSIS — Z888 Allergy status to other drugs, medicaments and biological substances status: Secondary | ICD-10-CM | POA: Insufficient documentation

## 2018-11-07 HISTORY — PX: FASCIECTOMY: SHX6525

## 2018-11-07 SURGERY — FASCIECTOMY, PALM
Anesthesia: Regional | Site: Hand | Laterality: Right

## 2018-11-07 MED ORDER — LIDOCAINE HCL (PF) 1 % IJ SOLN
INTRAMUSCULAR | Status: DC | PRN
  Administered 2018-11-07: 10 mL

## 2018-11-07 MED ORDER — FENTANYL CITRATE (PF) 100 MCG/2ML IJ SOLN
INTRAMUSCULAR | Status: AC
  Filled 2018-11-07: qty 2

## 2018-11-07 MED ORDER — CHLORHEXIDINE GLUCONATE 4 % EX LIQD: 60.0000 mL | Freq: Once | CUTANEOUS | Status: DC

## 2018-11-07 MED ORDER — BUPIVACAINE HCL (PF) 0.25 % IJ SOLN
INTRAMUSCULAR | Status: AC
  Filled 2018-11-07: qty 30

## 2018-11-07 MED ORDER — CEFAZOLIN SODIUM-DEXTROSE 2-4 GM/100ML-% IV SOLN
INTRAVENOUS | Status: AC
  Filled 2018-11-07: qty 100

## 2018-11-07 MED ORDER — FENTANYL CITRATE (PF) 100 MCG/2ML IJ SOLN: 50.0000 ug | INTRAMUSCULAR | Status: DC | PRN

## 2018-11-07 MED ORDER — CEFAZOLIN SODIUM-DEXTROSE 2-4 GM/100ML-% IV SOLN
2.0000 g | INTRAVENOUS | Status: AC
  Administered 2018-11-07: 2 g via INTRAVENOUS

## 2018-11-07 MED ORDER — THROMBIN 5000 UNITS EX SOLR
CUTANEOUS | Status: AC
  Filled 2018-11-07: qty 5000

## 2018-11-07 MED ORDER — ONDANSETRON HCL 4 MG/2ML IJ SOLN
INTRAMUSCULAR | Status: DC | PRN
  Administered 2018-11-07: 4 mg via INTRAVENOUS

## 2018-11-07 MED ORDER — MIDAZOLAM HCL 2 MG/2ML IJ SOLN
1.0000 mg | Freq: Once | INTRAMUSCULAR | Status: AC
  Administered 2018-11-07: 1 mg via INTRAVENOUS

## 2018-11-07 MED ORDER — LIDOCAINE HCL (PF) 1 % IJ SOLN
INTRAMUSCULAR | Status: AC
  Filled 2018-11-07: qty 30

## 2018-11-07 MED ORDER — FENTANYL CITRATE (PF) 100 MCG/2ML IJ SOLN: 25.0000 ug | INTRAMUSCULAR | Status: DC | PRN

## 2018-11-07 MED ORDER — PROPOFOL 500 MG/50ML IV EMUL
INTRAVENOUS | Status: DC | PRN
  Administered 2018-11-07: 75 ug/kg/min via INTRAVENOUS

## 2018-11-07 MED ORDER — FENTANYL CITRATE (PF) 100 MCG/2ML IJ SOLN
100.0000 ug | INTRAMUSCULAR | Status: DC | PRN
  Administered 2018-11-07: 100 ug via INTRAVENOUS

## 2018-11-07 MED ORDER — TRAMADOL HCL 50 MG PO TABS: 50.0000 mg | ORAL_TABLET | Freq: Four times a day (QID) | ORAL | 0 refills | Status: DC | PRN

## 2018-11-07 MED ORDER — MIDAZOLAM HCL 2 MG/2ML IJ SOLN
INTRAMUSCULAR | Status: AC
  Filled 2018-11-07: qty 2

## 2018-11-07 MED ORDER — SCOPOLAMINE 1 MG/3DAYS TD PT72: 1.0000 | MEDICATED_PATCH | Freq: Once | TRANSDERMAL | Status: DC

## 2018-11-07 MED ORDER — LACTATED RINGERS IV SOLN
INTRAVENOUS | Status: DC
  Administered 2018-11-07: 09:00:00 via INTRAVENOUS

## 2018-11-07 SURGICAL SUPPLY — 45 items
BLADE MINI RND TIP GREEN BEAV (BLADE) ×2 IMPLANT
BLADE SURG 15 STRL LF DISP TIS (BLADE) ×1 IMPLANT
BLADE SURG 15 STRL SS (BLADE) ×1
BNDG COHESIVE 3X5 TAN STRL LF (GAUZE/BANDAGES/DRESSINGS) ×2 IMPLANT
BNDG ESMARK 4X9 LF (GAUZE/BANDAGES/DRESSINGS) ×2 IMPLANT
BNDG GAUZE ELAST 4 BULKY (GAUZE/BANDAGES/DRESSINGS) ×2 IMPLANT
CHLORAPREP W/TINT 26 (MISCELLANEOUS) ×2 IMPLANT
CORD BIPOLAR FORCEPS 12FT (ELECTRODE) ×2 IMPLANT
COVER BACK TABLE REUSABLE LG (DRAPES) ×2 IMPLANT
COVER MAYO STAND REUSABLE (DRAPES) ×2 IMPLANT
COVER WAND RF STERILE (DRAPES) IMPLANT
CUFF TOURN SGL QUICK 18X4 (TOURNIQUET CUFF) ×1 IMPLANT
DECANTER SPIKE VIAL GLASS SM (MISCELLANEOUS) IMPLANT
DRAPE EXTREMITY T 121X128X90 (DISPOSABLE) ×2 IMPLANT
DRAPE SURG 17X23 STRL (DRAPES) ×2 IMPLANT
GAUZE SPONGE 4X4 12PLY STRL (GAUZE/BANDAGES/DRESSINGS) ×2 IMPLANT
GAUZE XEROFORM 1X8 LF (GAUZE/BANDAGES/DRESSINGS) ×2 IMPLANT
GLOVE BIOGEL PI IND STRL 7.0 (GLOVE) IMPLANT
GLOVE BIOGEL PI IND STRL 8.5 (GLOVE) ×1 IMPLANT
GLOVE BIOGEL PI INDICATOR 7.0 (GLOVE) ×1
GLOVE BIOGEL PI INDICATOR 8.5 (GLOVE) ×1
GLOVE SURG ORTHO 8.0 STRL STRW (GLOVE) ×1 IMPLANT
GLOVE SURG SS PI 7.0 STRL IVOR (GLOVE) ×1 IMPLANT
GLOVE SURG SYN 8.0 (GLOVE) ×2 IMPLANT
GLOVE SURG SYN 8.0 PF PI (GLOVE) IMPLANT
GOWN STRL REUS W/ TWL LRG LVL3 (GOWN DISPOSABLE) ×1 IMPLANT
GOWN STRL REUS W/TWL LRG LVL3 (GOWN DISPOSABLE) ×1
GOWN STRL REUS W/TWL XL LVL3 (GOWN DISPOSABLE) ×2 IMPLANT
LOOP VESSEL MAXI BLUE (MISCELLANEOUS) ×1 IMPLANT
NDL PRECISIONGLIDE 27X1.5 (NEEDLE) ×1 IMPLANT
NEEDLE PRECISIONGLIDE 27X1.5 (NEEDLE) ×2 IMPLANT
NS IRRIG 1000ML POUR BTL (IV SOLUTION) ×2 IMPLANT
PACK BASIN DAY SURGERY FS (CUSTOM PROCEDURE TRAY) ×2 IMPLANT
PAD CAST 3X4 CTTN HI CHSV (CAST SUPPLIES) ×1 IMPLANT
PADDING CAST COTTON 3X4 STRL (CAST SUPPLIES) ×1
SLEEVE SCD COMPRESS KNEE MED (MISCELLANEOUS) ×2 IMPLANT
SPLINT PLASTER CAST XFAST 3X15 (CAST SUPPLIES) IMPLANT
SPLINT PLASTER XTRA FASTSET 3X (CAST SUPPLIES) ×8
STOCKINETTE 4X48 STRL (DRAPES) ×2 IMPLANT
SUT ETHILON 4 0 PS 2 18 (SUTURE) ×2 IMPLANT
SUT SILK 2 0 PERMA HAND 18 BK (SUTURE) ×1 IMPLANT
SYR BULB 3OZ (MISCELLANEOUS) ×2 IMPLANT
SYR CONTROL 10ML LL (SYRINGE) ×2 IMPLANT
TOWEL GREEN STERILE FF (TOWEL DISPOSABLE) ×3 IMPLANT
UNDERPAD 30X30 (UNDERPADS AND DIAPERS) ×2 IMPLANT

## 2018-11-07 NOTE — Anesthesia Preprocedure Evaluation (Signed)
Anesthesia Evaluation  Patient identified by MRN, date of birth, ID band Patient awake    Reviewed: Allergy & Precautions, NPO status   Airway Mallampati: II  TM Distance: >3 FB     Dental   Pulmonary former smoker,    breath sounds clear to auscultation       Cardiovascular hypertension, + CAD   Rhythm:Regular Rate:Normal     Neuro/Psych    GI/Hepatic negative GI ROS, Neg liver ROS,   Endo/Other  diabetes  Renal/GU Renal disease     Musculoskeletal   Abdominal   Peds  Hematology   Anesthesia Other Findings   Reproductive/Obstetrics                             Anesthesia Physical Anesthesia Plan  ASA: III  Anesthesia Plan: Regional   Post-op Pain Management:    Induction: Intravenous  PONV Risk Score and Plan: Ondansetron  Airway Management Planned: Simple Face Mask  Additional Equipment:   Intra-op Plan:   Post-operative Plan:   Informed Consent: I have reviewed the patients History and Physical, chart, labs and discussed the procedure including the risks, benefits and alternatives for the proposed anesthesia with the patient or authorized representative who has indicated his/her understanding and acceptance.     Dental advisory given  Plan Discussed with: CRNA and Anesthesiologist  Anesthesia Plan Comments:         Anesthesia Quick Evaluation

## 2018-11-07 NOTE — Anesthesia Procedure Notes (Signed)
Anesthesia Regional Block: Axillary brachial plexus block   Pre-Anesthetic Checklist: ,, timeout performed, Correct Patient, Correct Site, Correct Laterality, Correct Procedure, Correct Position, site marked, Risks and benefits discussed,  Surgical consent,  Pre-op evaluation,  At surgeon's request and post-op pain management  Laterality: Right  Prep: chloraprep       Needles:  Injection technique: Single-shot  Needle Type: Stimulator Needle - 40          Additional Needles:   Procedures: Doppler guided, nerve stimulator,,, ultrasound used (permanent image in chart),,,,   Nerve Stimulator or Paresthesia:  Response: 0.5 mA,   Additional Responses:   Narrative:  Start time: 11/07/2018 9:10 AM End time: 11/07/2018 9:25 AM Injection made incrementally with aspirations every 5 mL. Anesthesiologist: Belinda Block, MD

## 2018-11-07 NOTE — Transfer of Care (Signed)
Immediate Anesthesia Transfer of Care Note  Patient: Jason Malone  Procedure(s) Performed: SEGMENTAL FASCIECTOMY RIGHT RING FINGER (Right Hand)  Patient Location: PACU  Anesthesia Type:MAC combined with regional for post-op pain  Level of Consciousness: awake, alert  and patient cooperative  Airway & Oxygen Therapy: Patient Spontanous Breathing and Patient connected to nasal cannula oxygen  Post-op Assessment: Report given to RN and Post -op Vital signs reviewed and stable  Post vital signs: Reviewed and stable  Last Vitals:  Vitals Value Taken Time  BP    Temp    Pulse 48 11/07/18 1044  Resp 12 11/07/18 1044  SpO2 93 % 11/07/18 1044  Vitals shown include unvalidated device data.  Last Pain:  Vitals:   11/07/18 0851  TempSrc: Oral  PainSc: 0-No pain         Complications: No apparent anesthesia complications

## 2018-11-07 NOTE — Brief Op Note (Signed)
11/07/2018  10:50 AM  PATIENT:  Jason Malone  78 y.o. male  PRE-OPERATIVE DIAGNOSIS:  DUPREYTRAN RIGHT  POST-OPERATIVE DIAGNOSIS:  DUPREYTRAN RIGHT  PROCEDURE:  Procedure(s) with comments: SEGMENTAL FASCIECTOMY RIGHT RING FINGER (Right) - AXILLARY BLOCK  SURGEON:  Surgeon(s) and Role:    * Daryll Brod, MD - Primary  PHYSICIAN ASSISTANT:   ASSISTANTS: none   ANESTHESIA:   local, regional and IV sedation  EBL:  65ml  BLOOD ADMINISTERED:none  DRAINS: none   LOCAL MEDICATIONS USED:  xylocaine   SPECIMEN:  Excision  DISPOSITION OF SPECIMEN:  PATHOLOGY  COUNTS:  YES  TOURNIQUET:   Total Tourniquet Time Documented: Upper Arm (Right) - 46 minutes Total: Upper Arm (Right) - 46 minutes   DICTATION: .Viviann Spare Dictation  PLAN OF CARE: Discharge to home after PACU  PATIENT DISPOSITION:  PACU - hemodynamically stable.

## 2018-11-07 NOTE — Op Note (Signed)
NAME: Jason Malone [facility] MEDICAL RECORD NO: [id number] DATE OF BIRTH: [birth year] FAC[facility] LOCATION: [facility] PHYSICIAN: [name], MD   OPERATIVE REPORT   DATE OF PROCEDURE: 11/07/18    PREOPERATIVE DIAGNOSIS:   Dupuytren's contracture right ring finger   POSTOPERATIVE DIAGNOSIS:   Same   PROCEDURE:   Segmental fasciectomy right ring finger   SURGEON: Daryll Brod, M.D.   ASSISTANT: none   ANESTHESIA:   Axillary block with sedation and local   INTRAVENOUS FLUIDS:  Per anesthesia flow sheet.   ESTIMATED BLOOD LOSS:  Minimal.   COMPLICATIONS:  None.   SPECIMENS:   Palmar fascia   TOURNIQUET TIME:    Total Tourniquet Time Documented: Upper Arm (Right) - 46 minutes Total: Upper Arm (Right) - 46 minutes    DISPOSITION:  Stable to PACU.   INDICATIONS: Patient is a 78 year old male with history of contracture of his right ring finger secondary to Dupuytren's cords.  He has several large nodules in the palm and onto the finger proximal phalanx.  He is desirous having this surgically reconstructed.  He has had his left hand done in the past.  Pre-peri-and postoperative course been discussed along with risks and complications.  He is aware that there is no guarantee to the surgery the possibility of infection recurrence injury to arteries nerves tendons complete relief symptoms dystrophy.  He is aware that he will still have nodules present in the palm with the segmental fasciectomy.  In the preoperative area the patient is seen extremity marked by both patient and surgeon antibiotic given  OPERATIVE COURSE: Patient is brought to the operating room after supraclavicular block was carried out without difficulty in the preoperative area under the direction the anesthesia department.  Was prepped and draped supine position with the right arm free.  A three-minute dry time was allowed and a timeout taken.  Prep was done with ChloraPrep.  The limb was exsanguinated  with an Esmarch bandage turn placed on the arm was inflated to 250 mmHg.  Patient had some feeling with the incision was made.  A wrist block was given to both median and ulnar nerves using 1% Xylocaine without epinephrine.  After adequate anesthesia was afforded.  Transverse incisions were made in the course of the cord.  This was done from a proximal to distal direction.  The cord was isolated proximally neurovascular structures identified freer elevator placed under the cord and the cord transected proper proximally with a segment removed.  Allow the finger to straighten slightly.  A second incision was made more distally at the metacarpal phalangeal joint crease.  This was again transverse in nature carried down through subcutaneous tissue.[name]  Neurovascular structures identified radially and ulnarly.  The cord was isolated and a 1 cm section removed.  The skin was undermined proximally and distally to allow this to occur.  The specimen was sent to pathology.  A separate incision was then made transversely over the proximal phalanx large nodule.  It was carried down through subcutaneous tissue.  With sharp and blunt dissection after isolation of the neurovascular bundles radially and ulnarly.  The nodule was separated from the skin which was still densely adherent to.  A small tear was occurred proximally.  This was longitudinal in nature.  The cord was isolated skin undermined proximally distally and the nodule and cord removed.  This was also sent to pathology.  The last incision was made just proximal to the PIP joint again a  large nodule was present.  Skin was incised transversely.  With blunt dissection the neurovascular bundles radially and ulnarly were identified.  The cord progressed around the flexor sheath radially and ulnarly.  These were released.  The nodule was then isolated from the overlying skin with blunt sharp dissection and the nodule sent to pathology.  The finger came fully straight at MP PIP  DIP joint.  Wounds were copiously irrigated with saline.  Neurovascular structures were again identified found to be intact on each of the incisions.  The skin was then closed with interrupted four 4-0 nylon sutures in the distal 2 wounds in the most proximal wound the central wound was closed in the periphery but left open centrally.  A sterile compressive dressing was applied.  The tourniquet deflated the finger immediately pinked along with the remaining digits.  The a dorsal splint was applied to the middle ring and small finger.  The patient was taken to the recovery room for observation in satisfactory condition.  He will be discharged home to return to the [facility] 1week Tylenol ibuprofen for pain with Ultram as a backup.   Daryll Brod, MD Electronically signed, 11/07/18

## 2018-11-07 NOTE — Progress Notes (Signed)
Assisted Dr. Nyoka Cowden with right, ultrasound guided, axillary block. Side rails up, monitors on throughout procedure. See vital signs in flow sheet. Tolerated Procedure well.

## 2018-11-07 NOTE — H&P (Signed)
Jason Malone is an 78 y.o. male.   Chief Complaint:contracture right ring finger HPI: Jason Malone  is now 10 right-handed. He has not been seen since 2013 following a fasciectomy of his left hand.He is complaining of similar problems on his right ring finger which is been going on for years. He is off [place] descent. He has no lumps on his feet. Has no history of diabetes thyroid problems arthritis or gout. Family history is positive diabetes negative for the remainder. He has been tested for this. He is not complaining of any pain numbness or tingling.    Past Medical History:  Diagnosis Date  . Allergic rhinitis   . Benign prostatic hypertrophy with lower urinary tract symptoms (LUTS)   . Chest pain    a. 05/2013 MV: EF 70%, no ischemia.  . Diverticulitis    DIVERTICULOSIS  . Enlarged prostate   . Essential hypertension    CONTROLLED ON MEDS  . Full dentures    upper and lower  . H/O hypokalemia   . Hx of atrial fibrillation without current medication    CARDIOLOGIST-DR [facility]  . 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 Hyperlipemia   . Hypomagnesemia   . IFG (impaired fasting glucose)   . Liver cancer ([place])   . Liver cancer ([place]) 12/08/2016  . Liver mass, left lobe 08/28/2016   Probably hemangioma; will get MRI of liver, refer to GI  . Lung cancer ([place])    a. carcinoid, left lung, Stage 1b (T2a, N0, cM0);  b. 05/2013 s/p VATS & LULobectomy.  . Monocytosis   . Osteopenia   . Paroxysmal atrial flutter (HCC)    a. 03/2013->no recurrence;  b. CHA2DS2VASc = 2-->not currently on anticoagulation;  c. 05/2012 Echo: EF 55-60%, normal RV.  [name] Wears glasses     Past Surgical History:  Procedure Laterality Date  . BRONCHOSCOPY     04/06/2013  . CARDIOVASCULAR STRESS TEST  05/2013   a. No evidence of ischemia or infarct, EF 70%, no WMAs  . CATARACT EXTRACTION W/PHACO Left 07/14/2015   Procedure: CATARACT EXTRACTION PHACO AND INTRAOCULAR LENS PLACEMENT  (IOC);  Surgeon: [name], MD;  Location: [place];  Service: Ophthalmology;  Laterality: Left;  . CATARACT EXTRACTION W/PHACO Right 10/01/2015   Procedure: CATARACT EXTRACTION PHACO AND INTRAOCULAR LENS PLACEMENT (IOC);  Surgeon: [name], MD;  Location: [place];  Service: Ophthalmology;  Laterality: Right;  . COLONOSCOPY W/ POLYPECTOMY     bleed after-had to go to surgery to stop bleeding via colonoscopy  . COLONOSCOPY WITH PROPOFOL N/A 11/19/2015   Procedure: COLONOSCOPY WITH PROPOFOL;  Surgeon: [name], MD;  Location: [facility] ENDOSCOPY;  Service: Endoscopy;  Laterality: N/A;  . DUPUYTREN CONTRACTURE RELEASE  12/15/2011   Procedure: DUPUYTREN CONTRACTURE RELEASE;  Surgeon: [name], MD;  Location: [place];  Service: Orthopedics;  Laterality: Left;  Fasciotomy left ring finger dupuytrens  . IR ANGIOGRAM SELECTIVE EACH ADDITIONAL VESSEL  07/26/2017  . IR ANGIOGRAM SELECTIVE EACH ADDITIONAL VESSEL  07/26/2017  . IR ANGIOGRAM SELECTIVE EACH ADDITIONAL VESSEL  07/26/2017  . IR ANGIOGRAM SELECTIVE EACH ADDITIONAL VESSEL  07/26/2017  . IR ANGIOGRAM SELECTIVE EACH ADDITIONAL VESSEL  07/26/2017  . IR ANGIOGRAM SELECTIVE EACH ADDITIONAL VESSEL  08/11/2017  . IR ANGIOGRAM SELECTIVE EACH ADDITIONAL VESSEL  04/28/2018  . IR ANGIOGRAM VISCERAL SELECTIVE  07/26/2017  . IR ANGIOGRAM VISCERAL SELECTIVE  08/11/2017  . IR ANGIOGRAM  VISCERAL SELECTIVE  04/28/2018  . IR EMBO ARTERIAL NOT HEMORR HEMANG INC GUIDE ROADMAPPING  07/26/2017  . IR EMBO TUMOR ORGAN ISCHEMIA INFARCT INC GUIDE ROADMAPPING  08/11/2017  . IR EMBO TUMOR ORGAN ISCHEMIA INFARCT INC GUIDE ROADMAPPING  04/28/2018  . IR RADIOLOGIST EVAL & MGMT  07/07/2017  . IR RADIOLOGIST EVAL & MGMT  09/07/2017  . IR RADIOLOGIST EVAL & MGMT  12/21/2017  . IR RADIOLOGIST EVAL & MGMT  03/22/2018  . IR RADIOLOGIST EVAL & MGMT  06/08/2018  . IR RADIOLOGIST EVAL & MGMT  11/01/2018  . IR US GUIDE VASC ACCESS  RIGHT  07/26/2017  . IR US GUIDE VASC ACCESS RIGHT  08/11/2017  . IR US GUIDE VASC ACCESS RIGHT  04/28/2018  . LUNG LOBECTOMY  06/10/13   upper left lung  . MULTIPLE TOOTH EXTRACTIONS    . REMOVAL RETAINED LENS Right 11/17/2015   Procedure: REMOVAL RETAINED LENS FROAGMENTS RIGHT EYE;  Surgeon: [name], MD;  Location: [place];  Service: Ophthalmology;  Laterality: Right;  . TONSILLECTOMY    . UPPER GASTROINTESTINAL ENDOSCOPY  11-03-15   Dr [name]  . VASECTOMY    . VIDEO ASSISTED THORACOSCOPY (VATS)/WEDGE RESECTION Left 06/04/2013   Procedure: VIDEO ASSISTED THORACOSCOPY (VATS)/WEDGE RESECTION;  Surgeon: [name], MD;  Location: [place];  Service: Thoracic;  Laterality: Left;  [name] VIDEO BRONCHOSCOPY N/A 06/04/2013   Procedure: VIDEO BRONCHOSCOPY;  Surgeon: [name], MD;  Location: [facility] OR;  Service: Thoracic;  Laterality: N/A;    Family History  Problem Relation Age of Onset  . Stroke Mother   . Alzheimer's disease Mother   . Hypertension Sister   . Hyperlipidemia Sister   . Hypertension Brother   . Hyperlipidemia Brother   . Diabetes Brother    Social History:  reports that he quit smoking about 19 years ago. His smoking use included cigarettes. He has a 40.00 pack-year smoking history. He has never used smokeless tobacco. He reports that he does not drink alcohol or use drugs.  Allergies:  Allergies  Allergen Reactions  . Latex Itching    Tape only- REACTED TO BANDAGE ON ABDOMEN  . Tape Itching    Surgical tapes     No medications prior to admission.    No results found for this or any previous visit (from the past 48 hour(s)).  No results found.   Pertinent items are noted in HPI.  Height 5\' 4"  (1.626 m), weight 65.8 kg.  General appearance: alert, cooperative and appears stated age Head: Normocephalic, without obvious abnormality Neck: no JVD Resp: clear to auscultation bilaterally Cardio: regular rate and rhythm, S1, S2 normal, no  murmur, click, rub or gallop GI: soft, non-tender; bowel sounds normal; no masses,  no organomegaly Extremities: Dupuytren's cord and contracture right ring finger Pulses: 2+ and symmetric Skin: Skin color, texture, turgor normal. No rashes or lesions Neurologic: Grossly normal Incision/Wound: na  Assessment/Plan Assessment:   Contracture of palmar fascia    Plan: We have discussed with him surgical intervention which he would like to proceed with. Pre-peri-postoperative course are well known to him. He is scheduled for segmental fasciectomy ring finger right hand as an outpatient under regional anesthesia.  He is aware that there is no guarantee to the surgery possibility of infection recurrence of the contracture stiffness possibility of injury to arteries nerves tendons loss of finger.  He is advised that we will not do a joint release, and  accept some flexion deformity to  the PIP joint.      Daryll Brod 11/07/2018, 5:08 AM

## 2018-11-07 NOTE — Discharge Instructions (Addendum)

## 2018-11-07 NOTE — Anesthesia Postprocedure Evaluation (Signed)
Anesthesia Post Note  Patient: Jason Malone  Procedure(s) Performed: SEGMENTAL FASCIECTOMY RIGHT RING FINGER (Right Hand)     Patient location during evaluation: PACU Anesthesia Type: Regional Level of consciousness: awake Pain management: pain level controlled Vital Signs Assessment: post-procedure vital signs reviewed and stable Cardiovascular status: stable Postop Assessment: no apparent nausea or vomiting Anesthetic complications: no    Last Vitals:  Vitals:   11/07/18 1115 11/07/18 1148  BP: 125/63 (!) 135/93  Pulse: (!) 44 (!) 50  Resp: 12 18  Temp:  36.5 C  SpO2: 94% 96%    Last Pain:  Vitals:   11/07/18 1148  TempSrc:   PainSc: 0-No pain                 Eldor Conaway

## 2018-11-09 ENCOUNTER — Encounter (HOSPITAL_BASED_OUTPATIENT_CLINIC_OR_DEPARTMENT_OTHER): Payer: Self-pay | Admitting: Orthopedic Surgery

## 2018-11-13 DIAGNOSIS — M25641 Stiffness of right hand, not elsewhere classified: Secondary | ICD-10-CM | POA: Diagnosis not present

## 2018-11-13 DIAGNOSIS — M720 Palmar fascial fibromatosis [Dupuytren]: Secondary | ICD-10-CM | POA: Diagnosis not present

## 2018-11-22 ENCOUNTER — Other Ambulatory Visit: Payer: Self-pay

## 2018-11-23 ENCOUNTER — Inpatient Hospital Stay (HOSPITAL_BASED_OUTPATIENT_CLINIC_OR_DEPARTMENT_OTHER): Payer: Medicare Other | Admitting: Oncology

## 2018-11-23 ENCOUNTER — Encounter: Payer: Self-pay | Admitting: Oncology

## 2018-11-23 ENCOUNTER — Inpatient Hospital Stay: Payer: Medicare Other

## 2018-11-23 ENCOUNTER — Other Ambulatory Visit: Payer: Self-pay

## 2018-11-23 ENCOUNTER — Inpatient Hospital Stay: Payer: Medicare Other | Attending: Oncology

## 2018-11-23 VITALS — BP 108/64 | HR 54 | Temp 98.3°F | Ht 64.0 in | Wt 148.0 lb

## 2018-11-23 DIAGNOSIS — C7A8 Other malignant neuroendocrine tumors: Secondary | ICD-10-CM

## 2018-11-23 DIAGNOSIS — C7B8 Other secondary neuroendocrine tumors: Secondary | ICD-10-CM

## 2018-11-23 DIAGNOSIS — E340 Carcinoid syndrome: Secondary | ICD-10-CM | POA: Diagnosis not present

## 2018-11-23 DIAGNOSIS — Z902 Acquired absence of lung [part of]: Secondary | ICD-10-CM | POA: Diagnosis not present

## 2018-11-23 DIAGNOSIS — Z87891 Personal history of nicotine dependence: Secondary | ICD-10-CM

## 2018-11-23 DIAGNOSIS — C7A090 Malignant carcinoid tumor of the bronchus and lung: Secondary | ICD-10-CM

## 2018-11-23 DIAGNOSIS — C7B02 Secondary carcinoid tumors of liver: Secondary | ICD-10-CM | POA: Diagnosis not present

## 2018-11-23 LAB — COMPREHENSIVE METABOLIC PANEL
ALT: 35 U/L (ref 0–44)
AST: 33 U/L (ref 15–41)
Albumin: 4 g/dL (ref 3.5–5.0)
Alkaline Phosphatase: 196 U/L — ABNORMAL HIGH (ref 38–126)
Anion gap: 7 (ref 5–15)
BUN: 20 mg/dL (ref 8–23)
CO2: 27 mmol/L (ref 22–32)
Calcium: 9.4 mg/dL (ref 8.9–10.3)
Chloride: 105 mmol/L (ref 98–111)
Creatinine, Ser: 0.97 mg/dL (ref 0.61–1.24)
GFR calc Af Amer: >60 mL/min (ref >60)
GFR calc non Af Amer: >60 mL/min (ref >60)
Glucose, Bld: 173 mg/dL — ABNORMAL HIGH (ref 70–99)
Potassium: 4.2 mmol/L (ref 3.5–5.1)
Sodium: 139 mmol/L (ref 135–145)
Total Bilirubin: 0.9 mg/dL (ref 0.3–1.2)
Total Protein: 7.3 g/dL (ref 6.5–8.1)

## 2018-11-23 LAB — CBC WITH DIFFERENTIAL/PLATELET
Abs Immature Granulocytes: 0.04 10*3/uL (ref 0.00–0.07)
Basophils Absolute: 0.1 10*3/uL (ref 0.0–0.1)
Basophils Relative: 1 %
Eosinophils Absolute: 0.2 10*3/uL (ref 0.0–0.5)
Eosinophils Relative: 3 %
HCT: 44.2 % (ref 39.0–52.0)
Hemoglobin: 14.5 g/dL (ref 13.0–17.0)
Immature Granulocytes: 1 %
Lymphocytes Relative: 8 %
Lymphs Abs: 0.7 10*3/uL (ref 0.7–4.0)
MCH: 29.7 pg (ref 26.0–34.0)
MCHC: 32.8 g/dL (ref 30.0–36.0)
MCV: 90.6 fL (ref 80.0–100.0)
Monocytes Absolute: 1.2 10*3/uL — ABNORMAL HIGH (ref 0.1–1.0)
Monocytes Relative: 15 %
Neutro Abs: 5.7 10*3/uL (ref 1.7–7.7)
Neutrophils Relative %: 72 %
Platelets: 172 10*3/uL (ref 150–400)
RBC: 4.88 MIL/uL (ref 4.22–5.81)
RDW: 14.2 % (ref 11.5–15.5)
Smear Review: ADEQUATE
WBC: 7.9 10*3/uL (ref 4.0–10.5)
nRBC: 0 % (ref 0.0–0.2)

## 2018-11-23 MED ORDER — LANREOTIDE ACETATE 120 MG/0.5ML ~~LOC~~ SOLN
120.0000 mg | Freq: Once | SUBCUTANEOUS | Status: AC
  Filled 2018-11-23: qty 120

## 2018-11-23 NOTE — Progress Notes (Signed)
Patient stated that he had been doing well with no complaints. 

## 2018-11-24 NOTE — Progress Notes (Signed)
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  INTERVAL HISTORY: Patient returns to clinic today for further evaluation and continuation of lanreotide.  He currently feels well and is asymptomatic. He has no neurologic complaints.  He denies any weakness or fatigue.  He has a good appetite and denies weight loss.  He denies any chest pain, shortness of breath, cough, or hemoptysis.  He denies any abdominal pain.  He has no nausea, vomiting, constipation, or diarrhea.  He has no urinary complaints.  Patient feels at his baseline offers no specific complaints today.  REVIEW OF SYSTEMS:   Review of Systems  Constitutional: Negative.  Negative for fever, malaise/fatigue and weight loss.  Respiratory: Negative.  Negative for cough, hemoptysis and shortness of breath.   Cardiovascular: Negative.  Negative for chest pain and leg swelling.  Gastrointestinal: Negative.  Negative for abdominal pain, diarrhea, nausea and vomiting.  Genitourinary: Negative.  Negative for dysuria.  Musculoskeletal: Negative.  Negative for back pain.  Skin: Negative.  Negative for rash.  Neurological: Negative.  Negative for dizziness, sensory change, focal weakness and weakness.  Psychiatric/Behavioral: Negative.  The patient is not nervous/anxious.     As per HPI. Otherwise, a complete review of systems is  negative.  PAST MEDICAL HISTORY: Past Medical History:  Diagnosis Date  . Allergic rhinitis   . Benign prostatic hypertrophy with lower urinary tract symptoms (LUTS)   . Chest pain    a. 05/2013 MV: EF 70%, no ischemia.  . Diverticulitis    DIVERTICULOSIS  . Enlarged prostate   . Essential hypertension    CONTROLLED ON MEDS  . Full dentures    upper and lower  . H/O hypokalemia   . Hx of atrial fibrillation without current medication    CARDIOLOGIST-DR [facility]  . 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 Hyperlipemia   . Hypomagnesemia   . IFG (impaired fasting glucose)   . Liver cancer ([place])   . Liver cancer ([place]) 12/08/2016  . Liver mass, left lobe 08/28/2016   Probably hemangioma; will get MRI of liver, refer to GI  . Lung cancer ([place])    a. carcinoid, left lung, Stage 1b (T2a, N0, cM0);  b. 05/2013 s/p VATS & LULobectomy.  . Monocytosis   . Osteopenia   . Paroxysmal atrial flutter (HCC)    a. 03/2013->no recurrence;  b. CHA2DS2VASc = 2-->not currently on anticoagulation;  c. 05/2012 Echo: EF 55-60%, normal RV.  [name] Wears glasses     PAST SURGICAL HISTORY: Past Surgical History:  Procedure Laterality Date  . BRONCHOSCOPY     04/06/2013  . CARDIOVASCULAR STRESS TEST  05/2013   a. No evidence of ischemia or infarct, EF 70%, no WMAs  . CATARACT EXTRACTION W/PHACO Left 07/14/2015   Procedure: CATARACT EXTRACTION PHACO AND INTRAOCULAR LENS PLACEMENT (IOC);  Surgeon: [name], MD;  Location: [place];  Service: Ophthalmology;  Laterality: Left;  . CATARACT EXTRACTION W/PHACO Right 10/01/2015  Procedure: CATARACT EXTRACTION PHACO AND INTRAOCULAR LENS PLACEMENT (IOC);  Surgeon: [name], MD;  Location: [place];  Service: Ophthalmology;  Laterality: Right;  . COLONOSCOPY W/ POLYPECTOMY     bleed after-had to go to surgery to stop bleeding via colonoscopy  . COLONOSCOPY WITH PROPOFOL N/A 11/19/2015   Procedure:  COLONOSCOPY WITH PROPOFOL;  Surgeon: [name], MD;  Location: [facility] ENDOSCOPY;  Service: Endoscopy;  Laterality: N/A;  . DUPUYTREN CONTRACTURE RELEASE  12/15/2011   Procedure: DUPUYTREN CONTRACTURE RELEASE;  Surgeon: [name], MD;  Location: [place];  Service: Orthopedics;  Laterality: Left;  Fasciotomy left ring finger dupuytrens  . FASCIECTOMY Right 11/07/2018   Procedure: SEGMENTAL FASCIECTOMY RIGHT RING FINGER;  Surgeon: Daryll Brod, MD;  Location: [place];  Service: Orthopedics;  Laterality: Right;  AXILLARY BLOCK  . IR ANGIOGRAM SELECTIVE EACH ADDITIONAL VESSEL  07/26/2017  . IR ANGIOGRAM SELECTIVE EACH ADDITIONAL VESSEL  07/26/2017  . IR ANGIOGRAM SELECTIVE EACH ADDITIONAL VESSEL  07/26/2017  . IR ANGIOGRAM SELECTIVE EACH ADDITIONAL VESSEL  07/26/2017  . IR ANGIOGRAM SELECTIVE EACH ADDITIONAL VESSEL  07/26/2017  . IR ANGIOGRAM SELECTIVE EACH ADDITIONAL VESSEL  08/11/2017  . IR ANGIOGRAM SELECTIVE EACH ADDITIONAL VESSEL  04/28/2018  . IR ANGIOGRAM VISCERAL SELECTIVE  07/26/2017  . IR ANGIOGRAM VISCERAL SELECTIVE  08/11/2017  . IR ANGIOGRAM VISCERAL SELECTIVE  04/28/2018  . IR EMBO ARTERIAL NOT HEMORR HEMANG INC GUIDE ROADMAPPING  07/26/2017  . IR EMBO TUMOR ORGAN ISCHEMIA INFARCT INC GUIDE ROADMAPPING  08/11/2017  . IR EMBO TUMOR ORGAN ISCHEMIA INFARCT INC GUIDE ROADMAPPING  04/28/2018  . IR RADIOLOGIST EVAL & MGMT  07/07/2017  . IR RADIOLOGIST EVAL & MGMT  09/07/2017  . IR RADIOLOGIST EVAL & MGMT  12/21/2017  . IR RADIOLOGIST EVAL & MGMT  03/22/2018  . IR RADIOLOGIST EVAL & MGMT  06/08/2018  . IR RADIOLOGIST EVAL & MGMT  11/01/2018  . IR US GUIDE VASC ACCESS RIGHT  07/26/2017  . IR US GUIDE VASC ACCESS RIGHT  08/11/2017  . IR US GUIDE VASC ACCESS RIGHT  04/28/2018  . LUNG LOBECTOMY  06/10/13   upper left lung  . MULTIPLE TOOTH EXTRACTIONS    . REMOVAL RETAINED LENS Right 11/17/2015   Procedure: REMOVAL RETAINED LENS FROAGMENTS RIGHT EYE;  Surgeon: [name], MD;  Location: [place];  Service: Ophthalmology;  Laterality: Right;  . TONSILLECTOMY    . UPPER GASTROINTESTINAL ENDOSCOPY  11-03-15   Dr [name]  . VASECTOMY    . VIDEO ASSISTED THORACOSCOPY (VATS)/WEDGE RESECTION Left 06/04/2013   Procedure: VIDEO ASSISTED THORACOSCOPY (VATS)/WEDGE RESECTION;  Surgeon: [name], MD;  Location: [place];  Service: Thoracic;  Laterality: Left;  [name] VIDEO BRONCHOSCOPY N/A 06/04/2013   Procedure: VIDEO BRONCHOSCOPY;  Surgeon: [name], MD;  Location: [facility] OR;  Service: Thoracic;  Laterality: N/A;    FAMILY HISTORY: Family History  Problem Relation Age of Onset  . Stroke Mother   . Alzheimer's disease Mother   . Hypertension Sister   . Hyperlipidemia Sister   . Hypertension Brother   . Hyperlipidemia Brother   . Diabetes Brother     ADVANCED DIRECTIVES (Y/N):  N  HEALTH MAINTENANCE: Social History   Tobacco Use  . Smoking status: Former Smoker    Packs/day: 1.00    Years: 40.00    Pack years: 40.00    Types: Cigarettes    Quit date: 12/09/1998    Years since quitting: 19.9  .  Smokeless tobacco: Never Used  Substance Use Topics  . Alcohol use: No  . Drug use: No     Colonoscopy:  PAP:  Bone density:  Lipid panel:  Allergies  Allergen Reactions  . Latex Itching    Tape only- REACTED TO BANDAGE ON ABDOMEN  . Tape Itching    Surgical tapes     Current Outpatient Medications  Medication Sig Dispense Refill  . amLODipine (NORVASC) 2.5 MG tablet Take 1 tablet by mouth once daily 90 tablet 0  . atorvastatin (LIPITOR) 40 MG tablet TAKE 1 TABLET BY MOUTH ONCE DAILY FOR CHOLESTEROL 90 tablet 1  . b complex vitamins capsule Take 1 capsule by mouth daily. AM    . Calcium Carbonate-Vit D-Min (CALCIUM 600+D PLUS MINERALS) 600-400 MG-UNIT TABS Take 1 tablet by mouth 1 day or 1 dose.    . Calcium Carbonate-Vitamin D3 (CALCIUM 600/VITAMIN D) 600-400 MG-UNIT TABS Take by mouth daily.    . carvedilol (COREG)  3.125 MG tablet TAKE 1 TABLET BY MOUTH ONCE DAILY IN THE MORNING 90 tablet 3  . Cinnamon 500 MG capsule Take 500 mg by mouth daily. AM AND BEDTIME    . fexofenadine (ALLEGRA) 180 MG tablet Take 180 mg by mouth daily.    . finasteride (PROSCAR) 5 MG tablet Take 1 tablet (5 mg total) by mouth daily. 90 tablet 1  . LANREOTIDE ACETATE [place] Inject 1 Dose into the skin every 30 (thirty) days.    [name] losartan (COZAAR) 100 MG tablet TAKE 1 TABLET BY MOUTH ONCE DAILY 90 tablet 3  . Multiple Vitamins-Minerals (MULTIVITAMIN WITH MINERALS) tablet Take 1 tablet by mouth daily. AM    . polycarbophil (FIBERCON) 625 MG tablet Take 625 mg by mouth daily. AM    . polyethylene glycol (MIRALAX / GLYCOLAX) packet Take 17 g by mouth daily as needed.     . tamsulosin (FLOMAX) 0.4 MG CAPS capsule Take 1 capsule (0.4 mg total) by mouth daily. 90 capsule 1  . traMADol (ULTRAM) 50 MG tablet Take 1 tablet (50 mg total) by mouth every 6 (six) hours as needed. 20 tablet 0  . vitamin C (ASCORBIC ACID) 500 MG tablet Take 500 mg by mouth daily. AM     No current facility-administered medications for this visit.    Facility-Administered Medications Ordered in Other Visits  Medication Dose Route Frequency Provider Last Rate Last Dose  . lanreotide acetate (SOMATULINE DEPOT) injection 120 mg  120 mg Subcutaneous Once Lloyd Huger, MD        OBJECTIVE: Vitals:   11/23/18 1016  BP: 108/64  Pulse: (!) 54  Temp: 98.3 F (36.8 C)     Body mass index is 25.4 kg/m.    ECOG FS:0 - Asymptomatic  General: Well-developed, well-nourished, no acute distress. Eyes: Pink conjunctiva, anicteric sclera. HEENT: Normocephalic, moist mucous membranes. Lungs: Clear to auscultation bilaterally. Heart: Regular rate and rhythm. No rubs, murmurs, or gallops. Abdomen: Soft, nontender, nondistended. No organomegaly noted, normoactive bowel sounds. Musculoskeletal: No edema, cyanosis, or clubbing. Neuro: Alert, answering all questions  appropriately. Cranial nerves grossly intact. Skin: No rashes or petechiae noted. Psych: Normal affect.  LAB RESULTS:  Lab Results  Component Value Date   NA 139 11/23/2018   K 4.2 11/23/2018   CL 105 11/23/2018   CO2 27 11/23/2018   GLUCOSE 173 (H) 11/23/2018   BUN 20 11/23/2018   CREATININE 0.97 11/23/2018   CALCIUM 9.4 11/23/2018   PROT 7.3 11/23/2018   ALBUMIN 4.0  11/23/2018   AST 33 11/23/2018   ALT 35 11/23/2018   ALKPHOS 196 (H) 11/23/2018   BILITOT 0.9 11/23/2018   GFRNONAA >60 11/23/2018   GFRAA >60 11/23/2018    Lab Results  Component Value Date   WBC 7.9 11/23/2018   NEUTROABS 5.7 11/23/2018   HGB 14.5 11/23/2018   HCT 44.2 11/23/2018   MCV 90.6 11/23/2018   PLT 172 11/23/2018     STUDIES: Ir Radiologist Eval & Mgmt  Result Date: 11/01/2018 Please refer to notes tab for details about interventional procedure. 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  PLAN:    1. 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 Patient underwent left upper lobe lobectomy in [place] on June 04, 2013. Final pathology results revealed a well differentiated neuroendocrine tumor with positive bronchial margins. Patient did not receive adjuvant XRT or chemotherapy at that time.  Patient's most recent MRI on Sep 15, 2018 reviewed independently with notable reduction in size and 2 right hepatic lobe lesions.  Patient's most recent Y 90 ablation occurred on April 28, 2018.  Chromogranin A levels from October 26, 2018 were mildly elevated at 238.2. Proceed with 120 mg subcutaneous lanreotide today.  Return to clinic in 4 and 8 weeks for treatment only and then in 12 weeks for further evaluation and continuation of lanreotide.  Continue follow-up with interventional radiology as scheduled.  Repeat MRI as scheduled for approximately  September 2020.  I spent a total of 30 minutes face-to-face with the patient of which greater than 50% of the visit was spent in counseling and coordination of care as detailed above.  Patient expressed understanding and was in agreement with this plan. He also understands that He can call clinic at any time with any questions, concerns, or complaints.   Cancer Staging Neuroendocrine carcinoma of lung [facility]) Staging form: Lung, AJCC 8th Edition - Clinical stage from 12/28/2016: Stage IVA (cT2a, cN0, cM1b) - Signed by Lloyd Huger, MD on 12/28/2016   Lloyd Huger, MD   11/24/2018 7:02 AM

## 2018-11-27 LAB — CHROMOGRANIN A: Chromogranin A (ng/mL): 231.7 ng/mL — ABNORMAL HIGH (ref 0.0–101.8)

## 2018-12-21 ENCOUNTER — Other Ambulatory Visit: Payer: Self-pay

## 2018-12-21 ENCOUNTER — Inpatient Hospital Stay: Payer: Medicare Other | Attending: Oncology

## 2018-12-21 ENCOUNTER — Inpatient Hospital Stay: Payer: Medicare Other

## 2018-12-21 DIAGNOSIS — C7A090 Malignant carcinoid tumor of the bronchus and lung: Secondary | ICD-10-CM | POA: Insufficient documentation

## 2018-12-21 DIAGNOSIS — Z79899 Other long term (current) drug therapy: Secondary | ICD-10-CM | POA: Diagnosis not present

## 2018-12-21 DIAGNOSIS — C7A8 Other malignant neuroendocrine tumors: Secondary | ICD-10-CM

## 2018-12-21 DIAGNOSIS — C7B02 Secondary carcinoid tumors of liver: Secondary | ICD-10-CM | POA: Insufficient documentation

## 2018-12-21 LAB — CBC WITH DIFFERENTIAL/PLATELET
Abs Immature Granulocytes: 0.02 10*3/uL (ref 0.00–0.07)
Basophils Absolute: 0.1 10*3/uL (ref 0.0–0.1)
Basophils Relative: 2 %
Eosinophils Absolute: 0.2 10*3/uL (ref 0.0–0.5)
Eosinophils Relative: 3 %
HCT: 43.7 % (ref 39.0–52.0)
Hemoglobin: 14.6 g/dL (ref 13.0–17.0)
Immature Granulocytes: 0 %
Lymphocytes Relative: 8 %
Lymphs Abs: 0.6 10*3/uL — ABNORMAL LOW (ref 0.7–4.0)
MCH: 30.4 pg (ref 26.0–34.0)
MCHC: 33.4 g/dL (ref 30.0–36.0)
MCV: 91 fL (ref 80.0–100.0)
Monocytes Absolute: 1.4 10*3/uL — ABNORMAL HIGH (ref 0.1–1.0)
Monocytes Relative: 18 %
Neutro Abs: 5.5 10*3/uL (ref 1.7–7.7)
Neutrophils Relative %: 69 %
Platelets: 163 10*3/uL (ref 150–400)
RBC: 4.8 MIL/uL (ref 4.22–5.81)
RDW: 13.7 % (ref 11.5–15.5)
WBC: 7.9 10*3/uL (ref 4.0–10.5)
nRBC: 0 % (ref 0.0–0.2)

## 2018-12-21 LAB — COMPREHENSIVE METABOLIC PANEL
ALT: 29 U/L (ref 0–44)
AST: 28 U/L (ref 15–41)
Albumin: 4 g/dL (ref 3.5–5.0)
Alkaline Phosphatase: 164 U/L — ABNORMAL HIGH (ref 38–126)
Anion gap: 9 (ref 5–15)
BUN: 22 mg/dL (ref 8–23)
CO2: 25 mmol/L (ref 22–32)
Calcium: 9.4 mg/dL (ref 8.9–10.3)
Chloride: 107 mmol/L (ref 98–111)
Creatinine, Ser: 0.78 mg/dL (ref 0.61–1.24)
GFR calc Af Amer: >60 mL/min (ref >60)
GFR calc non Af Amer: >60 mL/min (ref >60)
Glucose, Bld: 100 mg/dL — ABNORMAL HIGH (ref 70–99)
Potassium: 4.1 mmol/L (ref 3.5–5.1)
Sodium: 141 mmol/L (ref 135–145)
Total Bilirubin: 1 mg/dL (ref 0.3–1.2)
Total Protein: 6.9 g/dL (ref 6.5–8.1)

## 2018-12-21 MED ORDER — LANREOTIDE ACETATE 120 MG/0.5ML ~~LOC~~ SOLN
120.0000 mg | Freq: Once | SUBCUTANEOUS | Status: AC
  Administered 2018-12-21: 12:00:00 120 mg via SUBCUTANEOUS
  Filled 2018-12-21: qty 120

## 2018-12-22 LAB — CHROMOGRANIN A: Chromogranin A (ng/mL): 261.1 ng/mL — ABNORMAL HIGH (ref 0.0–101.8)

## 2018-12-30 ENCOUNTER — Other Ambulatory Visit: Payer: Self-pay | Admitting: Cardiovascular Disease

## 2019-01-17 ENCOUNTER — Other Ambulatory Visit: Payer: Self-pay

## 2019-01-17 DIAGNOSIS — C7A8 Other malignant neuroendocrine tumors: Secondary | ICD-10-CM

## 2019-01-18 ENCOUNTER — Inpatient Hospital Stay: Payer: Medicare Other

## 2019-01-18 ENCOUNTER — Other Ambulatory Visit: Payer: Self-pay

## 2019-01-18 ENCOUNTER — Inpatient Hospital Stay: Payer: Medicare Other | Attending: Oncology

## 2019-01-18 DIAGNOSIS — C7A090 Malignant carcinoid tumor of the bronchus and lung: Secondary | ICD-10-CM | POA: Diagnosis not present

## 2019-01-18 DIAGNOSIS — Z79899 Other long term (current) drug therapy: Secondary | ICD-10-CM | POA: Diagnosis not present

## 2019-01-18 DIAGNOSIS — C7A8 Other malignant neuroendocrine tumors: Secondary | ICD-10-CM

## 2019-01-18 DIAGNOSIS — C7B02 Secondary carcinoid tumors of liver: Secondary | ICD-10-CM | POA: Diagnosis not present

## 2019-01-18 LAB — CBC WITH DIFFERENTIAL/PLATELET
Abs Immature Granulocytes: 0.03 10*3/uL (ref 0.00–0.07)
Basophils Absolute: 0.1 10*3/uL (ref 0.0–0.1)
Basophils Relative: 1 %
Eosinophils Absolute: 0.2 10*3/uL (ref 0.0–0.5)
Eosinophils Relative: 3 %
HCT: 43.9 % (ref 39.0–52.0)
Hemoglobin: 14.6 g/dL (ref 13.0–17.0)
Immature Granulocytes: 0 %
Lymphocytes Relative: 8 %
Lymphs Abs: 0.7 10*3/uL (ref 0.7–4.0)
MCH: 30.6 pg (ref 26.0–34.0)
MCHC: 33.3 g/dL (ref 30.0–36.0)
MCV: 92 fL (ref 80.0–100.0)
Monocytes Absolute: 1.4 10*3/uL — ABNORMAL HIGH (ref 0.1–1.0)
Monocytes Relative: 16 %
Neutro Abs: 6 10*3/uL (ref 1.7–7.7)
Neutrophils Relative %: 72 %
Platelets: 154 10*3/uL (ref 150–400)
RBC: 4.77 MIL/uL (ref 4.22–5.81)
RDW: 13.7 % (ref 11.5–15.5)
WBC: 8.4 10*3/uL (ref 4.0–10.5)
nRBC: 0 % (ref 0.0–0.2)

## 2019-01-18 LAB — COMPREHENSIVE METABOLIC PANEL
ALT: 30 U/L (ref 0–44)
AST: 28 U/L (ref 15–41)
Albumin: 4.5 g/dL (ref 3.5–5.0)
Alkaline Phosphatase: 153 U/L — ABNORMAL HIGH (ref 38–126)
Anion gap: 6 (ref 5–15)
BUN: 18 mg/dL (ref 8–23)
CO2: 29 mmol/L (ref 22–32)
Calcium: 9.4 mg/dL (ref 8.9–10.3)
Chloride: 105 mmol/L (ref 98–111)
Creatinine, Ser: 1.07 mg/dL (ref 0.61–1.24)
GFR calc Af Amer: >60 mL/min (ref >60)
GFR calc non Af Amer: >60 mL/min (ref >60)
Glucose, Bld: 117 mg/dL — ABNORMAL HIGH (ref 70–99)
Potassium: 4.7 mmol/L (ref 3.5–5.1)
Sodium: 140 mmol/L (ref 135–145)
Total Bilirubin: 0.8 mg/dL (ref 0.3–1.2)
Total Protein: 6.7 g/dL (ref 6.5–8.1)

## 2019-01-18 MED ORDER — LANREOTIDE ACETATE 120 MG/0.5ML ~~LOC~~ SOLN
120.0000 mg | Freq: Once | SUBCUTANEOUS | Status: AC
  Administered 2019-01-18: 120 mg via SUBCUTANEOUS
  Filled 2019-01-18: qty 120

## 2019-01-22 ENCOUNTER — Other Ambulatory Visit: Payer: Self-pay | Admitting: Cardiovascular Disease

## 2019-01-23 LAB — CHROMOGRANIN A: Chromogranin A (ng/mL): 241.2 ng/mL — ABNORMAL HIGH (ref 0.0–101.8)

## 2019-01-24 ENCOUNTER — Ambulatory Visit (INDEPENDENT_AMBULATORY_CARE_PROVIDER_SITE_OTHER): Payer: Medicare Other

## 2019-01-24 ENCOUNTER — Other Ambulatory Visit: Payer: Self-pay

## 2019-01-24 DIAGNOSIS — Z23 Encounter for immunization: Secondary | ICD-10-CM | POA: Diagnosis not present

## 2019-02-07 DIAGNOSIS — L578 Other skin changes due to chronic exposure to nonionizing radiation: Secondary | ICD-10-CM | POA: Diagnosis not present

## 2019-02-07 DIAGNOSIS — Z872 Personal history of diseases of the skin and subcutaneous tissue: Secondary | ICD-10-CM | POA: Diagnosis not present

## 2019-02-07 DIAGNOSIS — L570 Actinic keratosis: Secondary | ICD-10-CM | POA: Diagnosis not present

## 2019-02-11 NOTE — Progress Notes (Signed)
[place]  [phone] [phone]  ID: Jason Malone OB: 05/22/1940  MR#: [id number]  IOE#:[id number]  Patient Care Team: Arnetha Courser, MD as PCP - General (Family Medicine) [name], Forest Gleason, MD (General Surgery) Nestor Lewandowsky, MD as Referring Physician (Cardiothoracic Surgery) Minna Merritts, MD as Consulting Physician (Cardiology) [name], MD as Consulting Physician (Cardiothoracic Surgery) Lloyd Huger, MD as Consulting Physician (Oncology) Sanda Klein Satira Anis, MD as Attending Physician (Family Medicine)  CHIEF COMPLAINT: 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  INTERVAL HISTORY: Patient returns to clinic today for routine evaluation and continuation of lanreotide.  He continues to feel well and remains asymptomatic. He has no neurologic complaints.  He denies any weakness or fatigue.  He has a good appetite and denies weight loss.  He denies any chest pain, shortness of breath, cough, or hemoptysis.  He denies any abdominal pain.  He has no nausea, vomiting, constipation, or diarrhea.  He has no urinary complaints.  Patient feels at his baseline offers no specific complaints today.  REVIEW OF SYSTEMS:   Review of Systems  Constitutional: Negative.  Negative for fever, malaise/fatigue and weight loss.  Respiratory: Negative.  Negative for cough, hemoptysis and shortness of breath.   Cardiovascular: Negative.  Negative for chest pain and leg swelling.  Gastrointestinal: Negative.  Negative for abdominal pain, diarrhea, nausea and vomiting.  Genitourinary: Negative.  Negative for dysuria.  Musculoskeletal: Negative.  Negative for back pain.  Skin: Negative.  Negative for rash.  Neurological: Negative.  Negative for dizziness, sensory change, focal weakness and weakness.  Psychiatric/Behavioral: Negative.  The patient is not nervous/anxious.     As per HPI. Otherwise, a complete review of systems  is negative.  PAST MEDICAL HISTORY: Past Medical History:  Diagnosis Date  . Allergic rhinitis   . Benign prostatic hypertrophy with lower urinary tract symptoms (LUTS)   . Chest pain    a. 05/2013 MV: EF 70%, no ischemia.  . Diverticulitis    DIVERTICULOSIS  . Enlarged prostate   . Essential hypertension    CONTROLLED ON MEDS  . Full dentures    upper and lower  . H/O hypokalemia   . Hx of atrial fibrillation without current medication    CARDIOLOGIST-DR [facility]  . 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 Hyperlipemia   . Hypomagnesemia   . IFG (impaired fasting glucose)   . Liver cancer ([place])   . Liver cancer ([place]) 12/08/2016  . Liver mass, left lobe 08/28/2016   Probably hemangioma; will get MRI of liver, refer to GI  . Lung cancer ([place])    a. carcinoid, left lung, Stage 1b (T2a, N0, cM0);  b. 05/2013 s/p VATS & LULobectomy.  . Monocytosis   . Osteopenia   . Paroxysmal atrial flutter (HCC)    a. 03/2013->no recurrence;  b. CHA2DS2VASc = 2-->not currently on anticoagulation;  c. 05/2012 Echo: EF 55-60%, normal RV.  [name] Wears glasses     PAST SURGICAL HISTORY: Past Surgical History:  Procedure Laterality Date  . BRONCHOSCOPY     04/06/2013  . CARDIOVASCULAR STRESS TEST  05/2013   a. No evidence of ischemia or infarct, EF 70%, no WMAs  . CATARACT EXTRACTION W/PHACO Left 07/14/2015   Procedure: CATARACT EXTRACTION PHACO AND INTRAOCULAR LENS PLACEMENT (IOC);  Surgeon: [name], MD;  Location: [place];  Service: Ophthalmology;  Laterality: Left;  . CATARACT EXTRACTION W/PHACO Right  10/01/2015   Procedure: CATARACT EXTRACTION PHACO AND INTRAOCULAR LENS PLACEMENT (IOC);  Surgeon: [name], MD;  Location: [place];  Service: Ophthalmology;  Laterality: Right;  . COLONOSCOPY W/ POLYPECTOMY     bleed after-had to go to surgery to stop bleeding via colonoscopy  . COLONOSCOPY WITH PROPOFOL N/A 11/19/2015    Procedure: COLONOSCOPY WITH PROPOFOL;  Surgeon: [name], MD;  Location: [facility] ENDOSCOPY;  Service: Endoscopy;  Laterality: N/A;  . DUPUYTREN CONTRACTURE RELEASE  12/15/2011   Procedure: DUPUYTREN CONTRACTURE RELEASE;  Surgeon: [name], MD;  Location: [place];  Service: Orthopedics;  Laterality: Left;  Fasciotomy left ring finger dupuytrens  . FASCIECTOMY Right 11/07/2018   Procedure: SEGMENTAL FASCIECTOMY RIGHT RING FINGER;  Surgeon: Daryll Brod, MD;  Location: [place];  Service: Orthopedics;  Laterality: Right;  AXILLARY BLOCK  . IR ANGIOGRAM SELECTIVE EACH ADDITIONAL VESSEL  07/26/2017  . IR ANGIOGRAM SELECTIVE EACH ADDITIONAL VESSEL  07/26/2017  . IR ANGIOGRAM SELECTIVE EACH ADDITIONAL VESSEL  07/26/2017  . IR ANGIOGRAM SELECTIVE EACH ADDITIONAL VESSEL  07/26/2017  . IR ANGIOGRAM SELECTIVE EACH ADDITIONAL VESSEL  07/26/2017  . IR ANGIOGRAM SELECTIVE EACH ADDITIONAL VESSEL  08/11/2017  . IR ANGIOGRAM SELECTIVE EACH ADDITIONAL VESSEL  04/28/2018  . IR ANGIOGRAM VISCERAL SELECTIVE  07/26/2017  . IR ANGIOGRAM VISCERAL SELECTIVE  08/11/2017  . IR ANGIOGRAM VISCERAL SELECTIVE  04/28/2018  . IR EMBO ARTERIAL NOT HEMORR HEMANG INC GUIDE ROADMAPPING  07/26/2017  . IR EMBO TUMOR ORGAN ISCHEMIA INFARCT INC GUIDE ROADMAPPING  08/11/2017  . IR EMBO TUMOR ORGAN ISCHEMIA INFARCT INC GUIDE ROADMAPPING  04/28/2018  . IR RADIOLOGIST EVAL & MGMT  07/07/2017  . IR RADIOLOGIST EVAL & MGMT  09/07/2017  . IR RADIOLOGIST EVAL & MGMT  12/21/2017  . IR RADIOLOGIST EVAL & MGMT  03/22/2018  . IR RADIOLOGIST EVAL & MGMT  06/08/2018  . IR RADIOLOGIST EVAL & MGMT  11/01/2018  . IR US GUIDE VASC ACCESS RIGHT  07/26/2017  . IR US GUIDE VASC ACCESS RIGHT  08/11/2017  . IR US GUIDE VASC ACCESS RIGHT  04/28/2018  . LUNG LOBECTOMY  06/10/13   upper left lung  . MULTIPLE TOOTH EXTRACTIONS    . REMOVAL RETAINED LENS Right 11/17/2015   Procedure: REMOVAL RETAINED LENS FROAGMENTS RIGHT EYE;  Surgeon:  [name], MD;  Location: [place];  Service: Ophthalmology;  Laterality: Right;  . TONSILLECTOMY    . UPPER GASTROINTESTINAL ENDOSCOPY  11-03-15   Dr [name]  . VASECTOMY    . VIDEO ASSISTED THORACOSCOPY (VATS)/WEDGE RESECTION Left 06/04/2013   Procedure: VIDEO ASSISTED THORACOSCOPY (VATS)/WEDGE RESECTION;  Surgeon: [name], MD;  Location: [place];  Service: Thoracic;  Laterality: Left;  [name] VIDEO BRONCHOSCOPY N/A 06/04/2013   Procedure: VIDEO BRONCHOSCOPY;  Surgeon: [name], MD;  Location: [facility] OR;  Service: Thoracic;  Laterality: N/A;    FAMILY HISTORY: Family History  Problem Relation Age of Onset  . Stroke Mother   . Alzheimer's disease Mother   . Hypertension Sister   . Hyperlipidemia Sister   . Hypertension Brother   . Hyperlipidemia Brother   . Diabetes Brother     ADVANCED DIRECTIVES (Y/N):  N  HEALTH MAINTENANCE: Social History   Tobacco Use  . Smoking status: Former Smoker    Packs/day: 1.00    Years: 40.00    Pack years: 40.00    Types: Cigarettes    Quit date: 12/09/1998    Years  since quitting: 20.2  . Smokeless tobacco: Never Used  Substance Use Topics  . Alcohol use: No  . Drug use: No     Colonoscopy:  PAP:  Bone density:  Lipid panel:  Allergies  Allergen Reactions  . Latex Itching    Tape only- REACTED TO BANDAGE ON ABDOMEN  . Tape Itching    Surgical tapes     Current Outpatient Medications  Medication Sig Dispense Refill  . amLODipine (NORVASC) 2.5 MG tablet Take 1 tablet by mouth once daily 90 tablet 3  . atorvastatin (LIPITOR) 40 MG tablet TAKE 1 TABLET BY MOUTH ONCE DAILY FOR CHOLESTEROL 90 tablet 1  . b complex vitamins capsule Take 1 capsule by mouth daily. AM    . Calcium Carbonate-Vitamin D3 (CALCIUM 600/VITAMIN D) 600-400 MG-UNIT TABS Take by mouth daily.    . carvedilol (COREG) 3.125 MG tablet TAKE 1 TABLET BY MOUTH ONCE DAILY IN THE MORNING 90 tablet 3  . Cinnamon 500 MG capsule Take 500 mg by  mouth daily. AM AND BEDTIME    . fexofenadine (ALLEGRA) 180 MG tablet Take 180 mg by mouth daily.    . finasteride (PROSCAR) 5 MG tablet Take 1 tablet (5 mg total) by mouth daily. 90 tablet 1  . LANREOTIDE ACETATE [place] Inject 1 Dose into the skin every 30 (thirty) days.    [name] losartan (COZAAR) 100 MG tablet Take 1 tablet by mouth once daily 90 tablet 0  . Multiple Vitamins-Minerals (MULTIVITAMIN WITH MINERALS) tablet Take 1 tablet by mouth daily. AM    . polycarbophil (FIBERCON) 625 MG tablet Take 625 mg by mouth daily. AM    . polyethylene glycol (MIRALAX / GLYCOLAX) packet Take 17 g by mouth daily as needed.     . tamsulosin (FLOMAX) 0.4 MG CAPS capsule Take 1 capsule (0.4 mg total) by mouth daily. 90 capsule 1  . vitamin C (ASCORBIC ACID) 500 MG tablet Take 500 mg by mouth daily. AM     No current facility-administered medications for this visit.    Facility-Administered Medications Ordered in Other Visits  Medication Dose Route Frequency Provider Last Rate Last Dose  . lanreotide acetate (SOMATULINE DEPOT) injection 120 mg  120 mg Subcutaneous Once Lloyd Huger, MD        OBJECTIVE: There were no vitals filed for this visit.   There is no height or weight on file to calculate BMI.    ECOG FS:0 - Asymptomatic  General: Well-developed, well-nourished, no acute distress. Eyes: Pink conjunctiva, anicteric sclera. HEENT: Normocephalic, moist mucous membranes. Lungs: Clear to auscultation bilaterally. Heart: Regular rate and rhythm. No rubs, murmurs, or gallops. Abdomen: Soft, nontender, nondistended. No organomegaly noted, normoactive bowel sounds. Musculoskeletal: No edema, cyanosis, or clubbing. Neuro: Alert, answering all questions appropriately. Cranial nerves grossly intact. Skin: No rashes or petechiae noted. Psych: Normal affect.  LAB RESULTS:  Lab Results  Component Value Date   NA 140 02/15/2019   K 4.4 02/15/2019   CL 107 02/15/2019   CO2 26 02/15/2019   GLUCOSE  120 (H) 02/15/2019   BUN 22 02/15/2019   CREATININE 0.94 02/15/2019   CALCIUM 9.3 02/15/2019   PROT 6.9 02/15/2019   ALBUMIN 3.9 02/15/2019   AST 32 02/15/2019   ALT 36 02/15/2019   ALKPHOS 142 (H) 02/15/2019   BILITOT 0.7 02/15/2019   GFRNONAA >60 02/15/2019   GFRAA >60 02/15/2019    Lab Results  Component Value Date   WBC 8.4 02/15/2019   NEUTROABS 5.8  02/15/2019   HGB 14.8 02/15/2019   HCT 44.6 02/15/2019   MCV 92.1 02/15/2019   PLT 162 02/15/2019     STUDIES: No results found.  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  PLAN:    1. 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 Patient underwent left upper lobe lobectomy in [place] on June 04, 2013. Final pathology results revealed a well differentiated neuroendocrine tumor with positive bronchial margins. Patient did not receive adjuvant XRT or chemotherapy at that time.  Patient's most recent MRI on Sep 15, 2018 reviewed independently with notable reduction in size and 2 right hepatic lobe lesions.  Patient's most recent Y 90 ablation occurred on April 28, 2018.  Patient's chromogranin a levels are elevated, but relatively stable ranging from 213-261.  He is unclear when his next follow-up is scheduled with interventional radiology, therefore will order a repeat MRI for early November or 6 months after his most recent imaging. Proceed with 120 mg subcutaneous lanreotide today.  Return to clinic in 4 weeks after his MRI for discussion of the results and continuation of treatment.  I spent a total of 30 minutes face-to-face with the patient of which greater than 50% of the visit was spent in counseling and coordination of care as detailed above.   Patient expressed understanding and was in agreement with this plan. He also understands that He can call clinic at any time with any  questions, concerns, or complaints.   Cancer Staging Neuroendocrine carcinoma of lung [facility]) Staging form: Lung, AJCC 8th Edition - Clinical stage from 12/28/2016: Stage IVA (cT2a, cN0, cM1b) - Signed by Lloyd Huger, MD on 12/28/2016   Lloyd Huger, MD   02/18/2019 4:45 PM

## 2019-02-14 ENCOUNTER — Other Ambulatory Visit: Payer: Self-pay

## 2019-02-14 NOTE — Progress Notes (Signed)
Patient pre screened for office appointment, no questions or concerns today. 

## 2019-02-15 ENCOUNTER — Inpatient Hospital Stay: Payer: Medicare Other | Attending: Oncology

## 2019-02-15 ENCOUNTER — Other Ambulatory Visit: Payer: Self-pay

## 2019-02-15 ENCOUNTER — Inpatient Hospital Stay (HOSPITAL_BASED_OUTPATIENT_CLINIC_OR_DEPARTMENT_OTHER): Payer: Medicare Other | Admitting: Oncology

## 2019-02-15 ENCOUNTER — Inpatient Hospital Stay: Payer: Medicare Other

## 2019-02-15 DIAGNOSIS — E785 Hyperlipidemia, unspecified: Secondary | ICD-10-CM | POA: Diagnosis not present

## 2019-02-15 DIAGNOSIS — I4891 Unspecified atrial fibrillation: Secondary | ICD-10-CM | POA: Insufficient documentation

## 2019-02-15 DIAGNOSIS — M8580 Other specified disorders of bone density and structure, unspecified site: Secondary | ICD-10-CM | POA: Insufficient documentation

## 2019-02-15 DIAGNOSIS — C7A8 Other malignant neuroendocrine tumors: Secondary | ICD-10-CM | POA: Diagnosis not present

## 2019-02-15 DIAGNOSIS — N400 Enlarged prostate without lower urinary tract symptoms: Secondary | ICD-10-CM | POA: Diagnosis not present

## 2019-02-15 DIAGNOSIS — Z79899 Other long term (current) drug therapy: Secondary | ICD-10-CM | POA: Diagnosis not present

## 2019-02-15 DIAGNOSIS — F17210 Nicotine dependence, cigarettes, uncomplicated: Secondary | ICD-10-CM | POA: Insufficient documentation

## 2019-02-15 DIAGNOSIS — C7A090 Malignant carcinoid tumor of the bronchus and lung: Secondary | ICD-10-CM | POA: Insufficient documentation

## 2019-02-15 DIAGNOSIS — C7B02 Secondary carcinoid tumors of liver: Secondary | ICD-10-CM | POA: Diagnosis present

## 2019-02-15 DIAGNOSIS — I10 Essential (primary) hypertension: Secondary | ICD-10-CM | POA: Diagnosis not present

## 2019-02-15 LAB — COMPREHENSIVE METABOLIC PANEL
ALT: 36 U/L (ref 0–44)
AST: 32 U/L (ref 15–41)
Albumin: 3.9 g/dL (ref 3.5–5.0)
Alkaline Phosphatase: 142 U/L — ABNORMAL HIGH (ref 38–126)
Anion gap: 7 (ref 5–15)
BUN: 22 mg/dL (ref 8–23)
CO2: 26 mmol/L (ref 22–32)
Calcium: 9.3 mg/dL (ref 8.9–10.3)
Chloride: 107 mmol/L (ref 98–111)
Creatinine, Ser: 0.94 mg/dL (ref 0.61–1.24)
GFR calc Af Amer: >60 mL/min (ref >60)
GFR calc non Af Amer: >60 mL/min (ref >60)
Glucose, Bld: 120 mg/dL — ABNORMAL HIGH (ref 70–99)
Potassium: 4.4 mmol/L (ref 3.5–5.1)
Sodium: 140 mmol/L (ref 135–145)
Total Bilirubin: 0.7 mg/dL (ref 0.3–1.2)
Total Protein: 6.9 g/dL (ref 6.5–8.1)

## 2019-02-15 LAB — CBC WITH DIFFERENTIAL/PLATELET
Abs Immature Granulocytes: 0.02 10*3/uL (ref 0.00–0.07)
Basophils Absolute: 0.1 10*3/uL (ref 0.0–0.1)
Basophils Relative: 2 %
Eosinophils Absolute: 0.3 10*3/uL (ref 0.0–0.5)
Eosinophils Relative: 3 %
HCT: 44.6 % (ref 39.0–52.0)
Hemoglobin: 14.8 g/dL (ref 13.0–17.0)
Immature Granulocytes: 0 %
Lymphocytes Relative: 9 %
Lymphs Abs: 0.8 10*3/uL (ref 0.7–4.0)
MCH: 30.6 pg (ref 26.0–34.0)
MCHC: 33.2 g/dL (ref 30.0–36.0)
MCV: 92.1 fL (ref 80.0–100.0)
Monocytes Absolute: 1.4 10*3/uL — ABNORMAL HIGH (ref 0.1–1.0)
Monocytes Relative: 16 %
Neutro Abs: 5.8 10*3/uL (ref 1.7–7.7)
Neutrophils Relative %: 70 %
Platelets: 162 10*3/uL (ref 150–400)
RBC: 4.84 MIL/uL (ref 4.22–5.81)
RDW: 13.4 % (ref 11.5–15.5)
WBC: 8.4 10*3/uL (ref 4.0–10.5)
nRBC: 0 % (ref 0.0–0.2)

## 2019-02-15 MED ORDER — LANREOTIDE ACETATE 120 MG/0.5ML ~~LOC~~ SOLN
120.0000 mg | Freq: Once | SUBCUTANEOUS | Status: AC
  Administered 2019-02-15: 12:00:00 120 mg via SUBCUTANEOUS
  Filled 2019-02-15: qty 120

## 2019-02-16 LAB — CHROMOGRANIN A: Chromogranin A (ng/mL): 213.3 ng/mL — ABNORMAL HIGH (ref 0.0–101.8)

## 2019-03-06 ENCOUNTER — Other Ambulatory Visit: Payer: Self-pay | Admitting: Interventional Radiology

## 2019-03-06 DIAGNOSIS — C7A8 Other malignant neuroendocrine tumors: Secondary | ICD-10-CM

## 2019-03-06 DIAGNOSIS — C7B8 Other secondary neuroendocrine tumors: Secondary | ICD-10-CM

## 2019-03-10 NOTE — Progress Notes (Signed)
Cardiology Office Note  Date:  03/12/2019   ID:  Jason Malone, DOB 07/07/1940, MRN [id number]  PCP:  Arnetha Courser, MD   Chief Complaint  Patient presents with  . other    12 month f/u no complaints today. Meds reviewed verbally with pt.    HPI:  Jason Malone is a 78 year-old gentleman with long history of  smoking,   stopped 2001 lung cancer on the left, status post resection January 2015,  hypertension,  hyperlipidemia  hospital 03/29/2013 with tachycardia, shortness of breath, atrial flutter, lung CA  treated with rate controlling medications, and converted to NSR on arrival to the hospital with potassium 3.1, low magnesium felt secondary to his HCTZ. This was stopped. He presents today for follow-up of his atrial flutter  Weight loss, 167 , Down to 145 Down 20 pounds He has been walking more, changed his diet  HBa1C 6.3 five months ago Was concerned about starting medications for diabetes so changed his lifestyle  Sometimes with lightheaded  Particularly when he gets up  Active, 20 min walking No chest pain or shortness of breath on exertion  Lab work reviewed with him HBA1C 6.3 Total cholesterol 90, LDL in the 30s  EKG personally reviewed by myself on todays visit Shows sinus bradycardia rate 49 bpm no significant ST or T wave changes   Last year  episode of chest pain after eating lunch Central chest with no radiation Went to the emergency room January 27, 2018 Cardiac enzymes negative, EKG benign Reports that while sitting in the emergency room had some belching and symptoms resolved   Discussed his cancer treatments liver met from lung, 3 cm x 2.6 cm Still receiving one shot once a month Radioactive seeds in liver  Previous studies CT chest  Mild aortic atherosclerosis, arch, descending aorta, Minimal CAD, calcification aortic valve (?left main)  Other past medical history reviewed Echocardiogram 03/29/2013 shows atrial flutter, ejection fraction  55-60%, otherwise normal study  CT scan of the chest shows 4.6 x 4.1 x 4.2 cm left hilar mass encasing the left pulmonary artery branches Recent lab work showing total cholesterol 171, HDL 39, LDL 86 prior  stress test 05/22/2013 showing no ischemia   PMH:   has a past medical history of Allergic rhinitis, Benign prostatic hypertrophy with lower urinary tract symptoms (LUTS), Chest pain, Diverticulitis, Enlarged prostate, Essential hypertension, Full dentures, H/O hypokalemia, atrial fibrillation without current medication, malignant carcinoid tumor of bronchus and lung (08/28/2016), Hyperlipemia, Hypomagnesemia, IFG (impaired fasting glucose), Liver cancer ([place]), Liver cancer ([place]) (12/08/2016), Liver mass, left lobe (08/28/2016), Lung cancer ([place]), Monocytosis, Osteopenia, Paroxysmal atrial flutter ([place]), and Wears glasses.  PSH:    Past Surgical History:  Procedure Laterality Date  . BRONCHOSCOPY     04/06/2013  . CARDIOVASCULAR STRESS TEST  05/2013   a. No evidence of ischemia or infarct, EF 70%, no WMAs  . CATARACT EXTRACTION W/PHACO Left 07/14/2015   Procedure: CATARACT EXTRACTION PHACO AND INTRAOCULAR LENS PLACEMENT (IOC);  Surgeon: [name], MD;  Location: [place];  Service: Ophthalmology;  Laterality: Left;  . CATARACT EXTRACTION W/PHACO Right 10/01/2015   Procedure: CATARACT EXTRACTION PHACO AND INTRAOCULAR LENS PLACEMENT (IOC);  Surgeon: [name], MD;  Location: [place];  Service: Ophthalmology;  Laterality: Right;  . COLONOSCOPY W/ POLYPECTOMY     bleed after-had to go to surgery to stop bleeding via colonoscopy  . COLONOSCOPY WITH PROPOFOL N/A 11/19/2015   Procedure: COLONOSCOPY WITH PROPOFOL;  Surgeon: Dellis Filbert  Amedeo Kinsman, MD;  Location: [facility] ENDOSCOPY;  Service: Endoscopy;  Laterality: N/A;  . DUPUYTREN CONTRACTURE RELEASE  12/15/2011   Procedure: DUPUYTREN CONTRACTURE RELEASE;  Surgeon: [name], MD;  Location: [place];  Service: Orthopedics;  Laterality: Left;  Fasciotomy left ring finger dupuytrens  . FASCIECTOMY Right 11/07/2018   Procedure: SEGMENTAL FASCIECTOMY RIGHT RING FINGER;  Surgeon: Daryll Brod, MD;  Location: [place];  Service: Orthopedics;  Laterality: Right;  AXILLARY BLOCK  . IR ANGIOGRAM SELECTIVE EACH ADDITIONAL VESSEL  07/26/2017  . IR ANGIOGRAM SELECTIVE EACH ADDITIONAL VESSEL  07/26/2017  . IR ANGIOGRAM SELECTIVE EACH ADDITIONAL VESSEL  07/26/2017  . IR ANGIOGRAM SELECTIVE EACH ADDITIONAL VESSEL  07/26/2017  . IR ANGIOGRAM SELECTIVE EACH ADDITIONAL VESSEL  07/26/2017  . IR ANGIOGRAM SELECTIVE EACH ADDITIONAL VESSEL  08/11/2017  . IR ANGIOGRAM SELECTIVE EACH ADDITIONAL VESSEL  04/28/2018  . IR ANGIOGRAM VISCERAL SELECTIVE  07/26/2017  . IR ANGIOGRAM VISCERAL SELECTIVE  08/11/2017  . IR ANGIOGRAM VISCERAL SELECTIVE  04/28/2018  . IR EMBO ARTERIAL NOT HEMORR HEMANG INC GUIDE ROADMAPPING  07/26/2017  . IR EMBO TUMOR ORGAN ISCHEMIA INFARCT INC GUIDE ROADMAPPING  08/11/2017  . IR EMBO TUMOR ORGAN ISCHEMIA INFARCT INC GUIDE ROADMAPPING  04/28/2018  . IR RADIOLOGIST EVAL & MGMT  07/07/2017  . IR RADIOLOGIST EVAL & MGMT  09/07/2017  . IR RADIOLOGIST EVAL & MGMT  12/21/2017  . IR RADIOLOGIST EVAL & MGMT  03/22/2018  . IR RADIOLOGIST EVAL & MGMT  06/08/2018  . IR RADIOLOGIST EVAL & MGMT  11/01/2018  . IR US GUIDE VASC ACCESS RIGHT  07/26/2017  . IR US GUIDE VASC ACCESS RIGHT  08/11/2017  . IR US GUIDE VASC ACCESS RIGHT  04/28/2018  . LUNG LOBECTOMY  06/10/13   upper left lung  . MULTIPLE TOOTH EXTRACTIONS    . REMOVAL RETAINED LENS Right 11/17/2015   Procedure: REMOVAL RETAINED LENS FROAGMENTS RIGHT EYE;  Surgeon: [name], MD;  Location: [place];  Service: Ophthalmology;  Laterality: Right;  . TONSILLECTOMY    . UPPER GASTROINTESTINAL ENDOSCOPY  11-03-15   Dr [name]  . VASECTOMY    . VIDEO ASSISTED THORACOSCOPY (VATS)/WEDGE RESECTION Left 06/04/2013   Procedure:  VIDEO ASSISTED THORACOSCOPY (VATS)/WEDGE RESECTION;  Surgeon: [name], MD;  Location: [place];  Service: Thoracic;  Laterality: Left;  [name] VIDEO BRONCHOSCOPY N/A 06/04/2013   Procedure: VIDEO BRONCHOSCOPY;  Surgeon: [name], MD;  Location: [facility] OR;  Service: Thoracic;  Laterality: N/A;    Current Outpatient Medications  Medication Sig Dispense Refill  . amLODipine (NORVASC) 2.5 MG tablet Take 1 tablet by mouth once daily 90 tablet 3  . atorvastatin (LIPITOR) 40 MG tablet TAKE 1 TABLET BY MOUTH ONCE DAILY FOR CHOLESTEROL 90 tablet 1  . b complex vitamins capsule Take 1 capsule by mouth daily. AM    . Calcium Carbonate-Vitamin D3 (CALCIUM 600/VITAMIN D) 600-400 MG-UNIT TABS Take by mouth daily.    . Cinnamon 500 MG capsule Take 500 mg by mouth daily. AM AND BEDTIME    . fexofenadine (ALLEGRA) 180 MG tablet Take 180 mg by mouth daily.    . finasteride (PROSCAR) 5 MG tablet Take 1 tablet (5 mg total) by mouth daily. 90 tablet 1  . LANREOTIDE ACETATE [place] Inject 1 Dose into the skin every 30 (thirty) days.    [name] losartan (COZAAR) 100 MG tablet Take 1 tablet by mouth once daily 90 tablet 0  . Multiple  Vitamins-Minerals (MULTIVITAMIN WITH MINERALS) tablet Take 1 tablet by mouth daily. AM    . polycarbophil (FIBERCON) 625 MG tablet Take 625 mg by mouth daily. AM    . polyethylene glycol (MIRALAX / GLYCOLAX) packet Take 17 g by mouth daily as needed.     . tamsulosin (FLOMAX) 0.4 MG CAPS capsule Take 1 capsule (0.4 mg total) by mouth daily. 90 capsule 1  . vitamin C (ASCORBIC ACID) 500 MG tablet Take 500 mg by mouth daily. AM     No current facility-administered medications for this visit.    Facility-Administered Medications Ordered in Other Visits  Medication Dose Route Frequency Provider Last Rate Last Dose  . lanreotide acetate (SOMATULINE DEPOT) injection 120 mg  120 mg Subcutaneous Once Lloyd Huger, MD         Allergies:   Latex and Tape   Social History:  The patient   reports that he quit smoking about 20 years ago. His smoking use included cigarettes. He has a 40.00 pack-year smoking history. He has never used smokeless tobacco. He reports that he does not drink alcohol or use drugs.   Family History:   family history includes Alzheimer's disease in his mother; Diabetes in his brother; Hyperlipidemia in his brother and sister; Hypertension in his brother and sister; Stroke in his mother.    Review of Systems: Review of Systems  Constitutional: Negative.   HENT: Negative.   Respiratory: Negative.   Cardiovascular: Negative.   Gastrointestinal: Negative.   Musculoskeletal: Negative.   Neurological: Positive for dizziness.  Psychiatric/Behavioral: Negative.   All other systems reviewed and are negative.   PHYSICAL EXAM: VS:  BP 110/70 (BP Location: Left Arm, Patient Position: Sitting, Cuff Size: Normal)   Pulse (!) 49   Ht _0  (1.626 m)   Wt 148 lb 12 oz (67.5 kg) Comment: 148lbs12oz  SpO2 98%   BMI 25.53 kg/m  , BMI Body mass index is 25.53 kg/m. Constitutional:  oriented to person, place, and time. No distress.  HENT:  Head: Grossly normal Eyes:  no discharge. No scleral icterus.  Neck: No JVD, no carotid bruits  Cardiovascular: Regular rate and rhythm, no murmurs appreciated Pulmonary/Chest: Clear to auscultation bilaterally, no wheezes or rails Abdominal: Soft.  no distension.  no tenderness.  Musculoskeletal: Normal range of motion Neurological:  normal muscle tone. Coordination normal. No atrophy Skin: Skin warm and dry Psychiatric: normal affect, pleasant   Recent Labs: 02/15/2019: ALT 36; BUN 22; Creatinine, Ser 0.94; Hemoglobin 14.8; Platelets 162; Potassium 4.4; Sodium 140    Lipid Panel Lab Results  Component Value Date   CHOL 96 10/10/2018   HDL 40 10/10/2018   LDLCALC 38 10/10/2018   TRIG 95 10/10/2018      Wt Readings from Last 3 Encounters:  03/12/19 148 lb 12 oz (67.5 kg)  11/23/18 148 lb (67.1 kg)  11/07/18  145 lb 11.6 oz (66.1 kg)       ASSESSMENT AND PLAN:  Chest pain No recent episodes of chest pain on exertion, no further work-up at this time  Essential hypertension - Plan: EKG 12-Lead Blood pressure and heart rate running low after recent 20 pound weight loss in the past 6 months Recommend he stop the carvedilol given bradycardia with orthostasis symptoms If blood pressure continues to run low we may need to hold the amlodipine  Paroxysmal atrial flutter ([place]) - Plan: EKG 12-Lead Maintaining normal sinus rhythm Some orthostasis, heart rate 49,  Will stop coreg  Mixed  hyperlipidemia Low numbers May need to decrease the Lipitor in half  Sinus bradycardia We will hold the carvedilol given rates in the 40s with orthostasis  Smoking history Stopped 2001  S/P lobectomy of lung Metastasis to liver Followed by finnegan  Coronary artery disease involving native heart without angina pectoris, unspecified vessel or lesion type CT scan heart chest minimal coronary calcifications noted Currently with no symptoms of angina. No further workup at this time. Continue current medication regimen.  Aortic atherosclerosis (HCC) Mild diffuse aortic atherosclerosis noted Lipids low   Total encounter time more than 25 minutes  Greater than 50% was spent in counseling and coordination of care with the patient   Disposition:   F/U  12 months   Orders Placed This Encounter  Procedures  . EKG 12-Lead     Signed, Esmond Plants, M.D., Ph.D. 03/12/2019  [place], [place]

## 2019-03-12 ENCOUNTER — Encounter: Payer: Self-pay | Admitting: Cardiovascular Disease

## 2019-03-12 ENCOUNTER — Ambulatory Visit (INDEPENDENT_AMBULATORY_CARE_PROVIDER_SITE_OTHER): Payer: Medicare Other | Admitting: Cardiovascular Disease

## 2019-03-12 ENCOUNTER — Other Ambulatory Visit: Payer: Self-pay

## 2019-03-12 VITALS — BP 110/70 | HR 49 | Ht 64.0 in | Wt 148.8 lb

## 2019-03-12 DIAGNOSIS — I700 Atherosclerosis of aorta: Secondary | ICD-10-CM | POA: Diagnosis not present

## 2019-03-12 DIAGNOSIS — I4892 Unspecified atrial flutter: Secondary | ICD-10-CM | POA: Diagnosis not present

## 2019-03-12 DIAGNOSIS — Z87891 Personal history of nicotine dependence: Secondary | ICD-10-CM

## 2019-03-12 DIAGNOSIS — I10 Essential (primary) hypertension: Secondary | ICD-10-CM | POA: Diagnosis not present

## 2019-03-12 DIAGNOSIS — E782 Mixed hyperlipidemia: Secondary | ICD-10-CM

## 2019-03-12 DIAGNOSIS — R001 Bradycardia, unspecified: Secondary | ICD-10-CM

## 2019-03-12 NOTE — Patient Instructions (Addendum)
Medication Instructions:  Please hold the coreg Monitor blood pressure IF pressure still low, call the office, We might need to stop the amlodipine  If cholesterol level very low on recheck, We might need to cut the lipitor in 1/2 daily  If you need a refill on your cardiac medications before your next appointment, please call your pharmacy.    Lab work: No new labs needed   If you have labs (blood work) drawn today and your tests are completely normal, you will receive your results only by: [name] MyChart Message (if you have MyChart) OR . A paper copy in the mail If you have any lab test that is abnormal or we need to change your treatment, we will call you to review the results.   Testing/Procedures: No new testing needed   Follow-Up: At [facility], you and your health needs are our priority.  As part of our continuing mission to provide you with exceptional heart care, we have created designated Provider Care Teams.  These Care Teams include your primary Cardiologist (physician) and Advanced Practice Providers (APPs -  Physician Assistants and Nurse Practitioners) who all work together to provide you with the care you need, when you need it.  . You will need a follow up appointment in 12 months .   Please call our office 2 months in advance to schedule this appointment.    . Providers on your designated Care Team:   . Murray Hodgkins, NP . Christell Faith, PA-C . Marrianne Mood, PA-C  Any Other Special Instructions Will Be Listed Below (If Applicable).  For educational health videos Log in to : [email or web address] Or : [email or web address], password : [email or web address]

## 2019-03-15 ENCOUNTER — Other Ambulatory Visit: Payer: Self-pay

## 2019-03-15 ENCOUNTER — Ambulatory Visit
Admission: RE | Admit: 2019-03-15 | Discharge: 2019-03-15 | Disposition: A | Discharge status: Home / Self Care | Payer: Medicare Other | Source: Ambulatory Visit | Attending: Oncology | Admitting: Oncology

## 2019-03-15 DIAGNOSIS — C7A8 Other malignant neuroendocrine tumors: Secondary | ICD-10-CM | POA: Diagnosis not present

## 2019-03-15 MED ORDER — GADOBUTROL 1 MMOL/ML IV SOLN
6.0000 mL | Freq: Once | INTRAVENOUS | Status: AC | PRN
  Administered 2019-03-15: 6 mL via INTRAVENOUS

## 2019-03-16 ENCOUNTER — Other Ambulatory Visit: Payer: Self-pay

## 2019-03-16 NOTE — Progress Notes (Signed)
Patient pre screened for office appointment, no questions or concerns today. 

## 2019-03-16 NOTE — Progress Notes (Signed)
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  INTERVAL HISTORY: Patient returns to clinic today for laboratory work, further evaluation, discussion of his imaging results, and continuation of lanreotide.  He continues to feel well and remains asymptomatic.  He has no neurologic complaints.  He denies any weakness or fatigue.  He has a good appetite and denies weight loss.  He denies any chest pain, shortness of breath, cough, or hemoptysis.  He denies any abdominal pain.  He has no nausea, vomiting, constipation, or diarrhea.  He has no urinary complaints.  Patient feels at his baseline offers no specific complaints today.  REVIEW OF SYSTEMS:   Review of Systems  Constitutional: Negative.  Negative for fever, malaise/fatigue and weight loss.  Respiratory: Negative.  Negative for cough, hemoptysis and shortness of breath.   Cardiovascular: Negative.  Negative for chest pain and leg swelling.  Gastrointestinal: Negative.  Negative for abdominal pain, diarrhea, nausea and vomiting.  Genitourinary: Negative.  Negative for dysuria.  Musculoskeletal: Negative.  Negative for back pain.  Skin: Negative.  Negative for rash.  Neurological: Negative.  Negative for dizziness, sensory change, focal weakness and weakness.  Psychiatric/Behavioral: Negative.  The patient is not nervous/anxious.      As per HPI. Otherwise, a complete review of systems is negative.  PAST MEDICAL HISTORY: Past Medical History:  Diagnosis Date   Allergic rhinitis    Benign prostatic hypertrophy with lower urinary tract symptoms (LUTS)    Chest pain    a. 05/2013 MV: EF 70%, no ischemia.   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 carcinoid, left lung, Stage 1b (T2a, N0, cM0);  b. 05/2013 s/p VATS & LULobectomy.   Monocytosis    Osteopenia    Paroxysmal atrial flutter (HCC)    a. 03/2013->no recurrence;  b. CHA2DS2VASc = 2-->not currently on anticoagulation;  c. 05/2012 Echo: EF 55-60%, normal RV.   Wears glasses     PAST SURGICAL HISTORY: Past Surgical History:  Procedure Laterality Date   BRONCHOSCOPY     04/06/2013   CARDIOVASCULAR STRESS TEST  05/2013   a. No evidence of ischemia or infarct, EF 70%, no WMAs   CATARACT EXTRACTION W/PHACO Left 07/14/2015   Procedure: CATARACT EXTRACTION PHACO AND INTRAOCULAR LENS PLACEMENT (IOC);  Surgeon: [name], MD;  Location: [place];  Service: Ophthalmology;  Laterality: Left;   CATARACT EXTRACTION W/PHACO Right 10/01/2015   Procedure: CATARACT EXTRACTION PHACO AND INTRAOCULAR LENS PLACEMENT (IOC);  Surgeon: [name], MD;  Location: [place];  Service: Ophthalmology;  Laterality: Right;   COLONOSCOPY W/ POLYPECTOMY     bleed after-had to go to surgery to stop bleeding via  colonoscopy   COLONOSCOPY WITH PROPOFOL N/A 11/19/2015   Procedure: COLONOSCOPY WITH PROPOFOL;  Surgeon: [name], MD;  Location: [facility] ENDOSCOPY;  Service: Endoscopy;  Laterality: N/A;   DUPUYTREN CONTRACTURE RELEASE  12/15/2011   Procedure: DUPUYTREN CONTRACTURE RELEASE;  Surgeon: [name], MD;  Location: [place];  Service: Orthopedics;  Laterality: Left;  Fasciotomy left ring finger dupuytrens   FASCIECTOMY Right 11/07/2018   Procedure: SEGMENTAL FASCIECTOMY RIGHT RING FINGER;  Surgeon: Daryll Brod, MD;  Location: [place];  Service: Orthopedics;  Laterality: Right;  AXILLARY BLOCK   IR ANGIOGRAM SELECTIVE EACH ADDITIONAL VESSEL  07/26/2017   IR ANGIOGRAM SELECTIVE EACH ADDITIONAL VESSEL  07/26/2017   IR ANGIOGRAM SELECTIVE EACH ADDITIONAL VESSEL  07/26/2017   IR ANGIOGRAM SELECTIVE EACH ADDITIONAL VESSEL  07/26/2017   IR ANGIOGRAM SELECTIVE EACH ADDITIONAL VESSEL  07/26/2017   IR ANGIOGRAM SELECTIVE EACH ADDITIONAL VESSEL  08/11/2017   IR ANGIOGRAM SELECTIVE EACH ADDITIONAL VESSEL  04/28/2018   IR ANGIOGRAM VISCERAL SELECTIVE  07/26/2017   IR ANGIOGRAM VISCERAL SELECTIVE  08/11/2017   IR ANGIOGRAM VISCERAL SELECTIVE  04/28/2018   IR EMBO ARTERIAL NOT HEMORR HEMANG INC GUIDE ROADMAPPING  07/26/2017   IR EMBO TUMOR ORGAN ISCHEMIA INFARCT INC GUIDE ROADMAPPING  08/11/2017   IR EMBO TUMOR ORGAN ISCHEMIA INFARCT INC GUIDE ROADMAPPING  04/28/2018   IR RADIOLOGIST EVAL & MGMT  07/07/2017   IR RADIOLOGIST EVAL & MGMT  09/07/2017   IR RADIOLOGIST EVAL & MGMT  12/21/2017   IR RADIOLOGIST EVAL & MGMT  03/22/2018   IR RADIOLOGIST EVAL & MGMT  06/08/2018   IR RADIOLOGIST EVAL & MGMT  11/01/2018   IR US GUIDE VASC ACCESS RIGHT  07/26/2017   IR US GUIDE VASC ACCESS RIGHT  08/11/2017   IR US GUIDE VASC ACCESS RIGHT  04/28/2018   LUNG LOBECTOMY  06/10/13   upper left lung   MULTIPLE TOOTH EXTRACTIONS     REMOVAL RETAINED LENS Right 11/17/2015    Procedure: REMOVAL RETAINED LENS FROAGMENTS RIGHT EYE;  Surgeon: [name], MD;  Location: [place];  Service: Ophthalmology;  Laterality: Right;   TONSILLECTOMY     UPPER GASTROINTESTINAL ENDOSCOPY  11-03-15   Dr [name]   VASECTOMY     VIDEO ASSISTED THORACOSCOPY (VATS)/WEDGE RESECTION Left 06/04/2013   Procedure: VIDEO ASSISTED THORACOSCOPY (VATS)/WEDGE RESECTION;  Surgeon: [name], MD;  Location: [place];  Service: Thoracic;  Laterality: Left;   VIDEO BRONCHOSCOPY N/A 06/04/2013   Procedure: VIDEO BRONCHOSCOPY;  Surgeon: [name], MD;  Location: [facility] OR;  Service: Thoracic;  Laterality: N/A;    FAMILY HISTORY: Family History  Problem Relation Age of Onset   Stroke Mother    Alzheimer's disease Mother    Hypertension Sister    Hyperlipidemia Sister    Hypertension Brother    Hyperlipidemia Brother    Diabetes Brother     ADVANCED DIRECTIVES (Y/N):  N  HEALTH MAINTENANCE: Social History   Tobacco Use   Smoking status: Former Smoker    Packs/day: 1.00    Years: 40.00    Pack years: 40.00    Types: Cigarettes  Quit date: 12/09/1998    Years since quitting: 20.2   Smokeless tobacco: Never Used  Substance Use Topics   Alcohol use: No   Drug use: No     Colonoscopy:  PAP:  Bone density:  Lipid panel:  Allergies  Allergen Reactions   Latex Itching    Tape only- REACTED TO BANDAGE ON ABDOMEN   Tape Itching    Surgical tapes     Current Outpatient Medications  Medication Sig Dispense Refill   amLODipine (NORVASC) 2.5 MG tablet Take 1 tablet by mouth once daily 90 tablet 3   atorvastatin (LIPITOR) 40 MG tablet TAKE 1 TABLET BY MOUTH ONCE DAILY FOR CHOLESTEROL 90 tablet 1   b complex vitamins capsule Take 1 capsule by mouth daily. AM     Calcium Carbonate-Vitamin D3 (CALCIUM 600/VITAMIN D) 600-400 MG-UNIT TABS Take by mouth daily.     Cinnamon 500 MG capsule Take 500 mg by mouth daily. AM AND BEDTIME      fexofenadine (ALLEGRA) 180 MG tablet Take 180 mg by mouth daily.     finasteride (PROSCAR) 5 MG tablet Take 1 tablet (5 mg total) by mouth daily. 90 tablet 1   LANREOTIDE ACETATE [place] Inject 1 Dose into the skin every 30 (thirty) days.     losartan (COZAAR) 100 MG tablet Take 1 tablet by mouth once daily 90 tablet 0   Multiple Vitamins-Minerals (MULTIVITAMIN WITH MINERALS) tablet Take 1 tablet by mouth daily. AM     polycarbophil (FIBERCON) 625 MG tablet Take 625 mg by mouth daily. AM     polyethylene glycol (MIRALAX / GLYCOLAX) packet Take 17 g by mouth daily as needed.      tamsulosin (FLOMAX) 0.4 MG CAPS capsule Take 1 capsule (0.4 mg total) by mouth daily. 90 capsule 1   vitamin C (ASCORBIC ACID) 500 MG tablet Take 500 mg by mouth daily. AM     No current facility-administered medications for this visit.    Facility-Administered Medications Ordered in Other Visits  Medication Dose Route Frequency Provider Last Rate Last Dose   lanreotide acetate (SOMATULINE DEPOT) injection 120 mg  120 mg Subcutaneous Once Lloyd Huger, MD        OBJECTIVE: Vitals:   03/19/19 1032  BP: 132/69  Pulse: (!) 55  Resp: 16  Temp: 97.8 F (36.6 C)  SpO2: 99%     Body mass index is 25.64 kg/m.    ECOG FS:0 - Asymptomatic  General: Well-developed, well-nourished, no acute distress. Eyes: Pink conjunctiva, anicteric sclera. HEENT: Normocephalic, moist mucous membranes. Lungs: Clear to auscultation bilaterally. Heart: Regular rate and rhythm. No rubs, murmurs, or gallops. Abdomen: Soft, nontender, nondistended. No organomegaly noted, normoactive bowel sounds. Musculoskeletal: No edema, cyanosis, or clubbing. Neuro: Alert, answering all questions appropriately. Cranial nerves grossly intact. Skin: No rashes or petechiae noted. Psych: Normal affect.  LAB RESULTS:  Lab Results  Component Value Date   NA 137 03/19/2019   K 3.8 03/19/2019   CL 104 03/19/2019   CO2 25 03/19/2019    GLUCOSE 152 (H) 03/19/2019   BUN 22 03/19/2019   CREATININE 0.98 03/19/2019   CALCIUM 9.0 03/19/2019   PROT 7.0 03/19/2019   ALBUMIN 4.1 03/19/2019   AST 26 03/19/2019   ALT 25 03/19/2019   ALKPHOS 139 (H) 03/19/2019   BILITOT 0.8 03/19/2019   GFRNONAA >60 03/19/2019   GFRAA >60 03/19/2019    Lab Results  Component Value Date   WBC 8.7 03/19/2019   NEUTROABS 6.3  03/19/2019   HGB 14.5 03/19/2019   HCT 44.7 03/19/2019   MCV 92.9 03/19/2019   PLT 151 03/19/2019     STUDIES: Mr Abdomen W Wo Contrast  Result Date: 03/15/2019 CLINICAL DATA:  Follow-up neuroendocrine carcinoma EXAM: MRI ABDOMEN WITHOUT AND WITH CONTRAST TECHNIQUE: Multiplanar multisequence MR imaging of the abdomen was performed both before and after the administration of intravenous contrast. CONTRAST:  93mL GADAVIST GADOBUTROL 1 MMOL/ML IV SOLN COMPARISON:  09/15/2018 FINDINGS: Lower chest: Incidental imaging of the lung bases is unremarkable, limited assessment on MRI. Hepatobiliary: Multiple hepatic lesions. Lesions are best seen on diffusion-weighted imaging. The largest in the lateral segment of the left hepatic lobe along the inter lobar fissure measures 2.1 x 2.0 cm (image 30, series 19) this is difficult to compare to the most recent study as there was extensive motion artifact on the most recent prior. Internal enhancing characteristics and lesion size with slight increase in central enhancement but similar to slightly decreased size dating back to November of 2019. There is more enhancement along the inferior margin of the lesion then there as along the superior margin. Pericapsular lesion along the margin of hepatic segment VIII measures 1.8 cm in greatest axial dimension, approximately 2.3 cm in greatest axial dimension in November of 2019. When assessing diffusion-weighted sequences there is more robust diffusion along the inferior margin of the dominant left lobe the lesion than there was in November of 2019 and  likely slight increase in solid diffusion restricting and enhancing area along the inferior margin of this area since May of 2020 as well; as measured on diffusion-weighted sequences, the areas measure phenomenon on today's exam best seen on image 83 of series 7, stable area of restricted diffusion in visible on later phase post-contrast images in hepatic segment VI is stable. Approximately 1.5 as compared to 1.0 cm. Lesion near the dome of the liver on diffusion shows subtle central restricted diffusion, closer to its appearance in the examination of May of 2020 but with improvement when compared to the study of November of 2019. Pancreas: No mass, inflammatory changes, or other parenchymal abnormality identified. Spleen:  Stable small splenic lesion. Adrenals/Urinary Tract: Normal adrenals, mild cortical scarring and left renal atrophy. Stomach/Bowel: No signs of acute gastrointestinal process. Signs of colonic diverticulosis. Vascular/Lymphatic: Signs of atheromatous change in the abdominal aorta, no signs of aneurysm. Other:  No signs of ascites. Musculoskeletal: No signs of destructive bone process or metastatic disease. IMPRESSION: 1. Multiple hepatic lesions are stable to slightly decreased in size dating back to November of 2019. The largest lesion in the lateral segment of the left hepatic lobe is stable to slightly decreased in size dating back to November of 2019. There is however a solid component in the inferior aspect of the lesion that may show more restricted diffusion in slight increase size, attention on follow-up. 2. No new sites of metastatic disease in the abdomen. 3. Stable small splenic lesion likely hemangioma or hamartoma. 4. Colonic diverticulosis. Electronically Signed   By: Zetta Bills M.D.   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  PLAN:    1. 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 Patient underwent left upper lobe lobectomy in [place] on June 04, 2013. Final pathology results revealed a well differentiated neuroendocrine tumor with positive bronchial margins. Patient did not receive adjuvant XRT or  chemotherapy at that time.  Patient's most recent MRI on March 15, 2019 reviewed independently and reported as above with stable to decrease in size of known lesions in patient's liver.  His most recent Y 90 ablation occurred on April 28, 2018.  Patient's chromogranin a levels are elevated, but relatively stable ranging from 213-261.  Proceed with 120 mg subcutaneous lanreotide today.  Return to clinic in 4 and 8 weeks for lab and lanreotide only.  Patient then return to clinic in 12 weeks for further evaluation and continuation of treatment.    I spent a total of 30 minutes face-to-face with the patient of which greater than 50% of the visit was spent in counseling and coordination of care as detailed above.   Patient expressed understanding and was in agreement with this plan. He also understands that He can call clinic at any time with any questions, concerns, or complaints.   Cancer Staging Neuroendocrine carcinoma of lung [facility]) Staging form: Lung, AJCC 8th Edition - Clinical stage from 12/28/2016: Stage IVA (cT2a, cN0, cM1b) - Signed by Lloyd Huger, MD on 12/28/2016   Lloyd Huger, MD   03/19/2019 11:50 AM

## 2019-03-19 ENCOUNTER — Inpatient Hospital Stay (HOSPITAL_BASED_OUTPATIENT_CLINIC_OR_DEPARTMENT_OTHER): Payer: Medicare Other | Admitting: Oncology

## 2019-03-19 ENCOUNTER — Inpatient Hospital Stay: Payer: Medicare Other | Attending: Oncology

## 2019-03-19 ENCOUNTER — Other Ambulatory Visit: Payer: Self-pay

## 2019-03-19 ENCOUNTER — Inpatient Hospital Stay: Payer: Medicare Other

## 2019-03-19 VITALS — BP 132/69 | HR 55 | Temp 97.8°F | Resp 16 | Wt 149.4 lb

## 2019-03-19 DIAGNOSIS — C7A8 Other malignant neuroendocrine tumors: Secondary | ICD-10-CM

## 2019-03-19 DIAGNOSIS — M8580 Other specified disorders of bone density and structure, unspecified site: Secondary | ICD-10-CM | POA: Insufficient documentation

## 2019-03-19 DIAGNOSIS — I4891 Unspecified atrial fibrillation: Secondary | ICD-10-CM | POA: Diagnosis not present

## 2019-03-19 DIAGNOSIS — C7B8 Other secondary neuroendocrine tumors: Secondary | ICD-10-CM

## 2019-03-19 DIAGNOSIS — F17210 Nicotine dependence, cigarettes, uncomplicated: Secondary | ICD-10-CM | POA: Insufficient documentation

## 2019-03-19 DIAGNOSIS — C7B02 Secondary carcinoid tumors of liver: Secondary | ICD-10-CM | POA: Insufficient documentation

## 2019-03-19 DIAGNOSIS — I10 Essential (primary) hypertension: Secondary | ICD-10-CM | POA: Diagnosis not present

## 2019-03-19 DIAGNOSIS — E785 Hyperlipidemia, unspecified: Secondary | ICD-10-CM | POA: Diagnosis not present

## 2019-03-19 DIAGNOSIS — Z79899 Other long term (current) drug therapy: Secondary | ICD-10-CM | POA: Diagnosis not present

## 2019-03-19 DIAGNOSIS — N400 Enlarged prostate without lower urinary tract symptoms: Secondary | ICD-10-CM | POA: Diagnosis not present

## 2019-03-19 DIAGNOSIS — C7A090 Malignant carcinoid tumor of the bronchus and lung: Secondary | ICD-10-CM | POA: Diagnosis not present

## 2019-03-19 LAB — CBC WITH DIFFERENTIAL/PLATELET
Abs Immature Granulocytes: 0.02 10*3/uL (ref 0.00–0.07)
Basophils Absolute: 0.1 10*3/uL (ref 0.0–0.1)
Basophils Relative: 1 %
Eosinophils Absolute: 0.2 10*3/uL (ref 0.0–0.5)
Eosinophils Relative: 2 %
HCT: 44.7 % (ref 39.0–52.0)
Hemoglobin: 14.5 g/dL (ref 13.0–17.0)
Immature Granulocytes: 0 %
Lymphocytes Relative: 9 %
Lymphs Abs: 0.8 10*3/uL (ref 0.7–4.0)
MCH: 30.1 pg (ref 26.0–34.0)
MCHC: 32.4 g/dL (ref 30.0–36.0)
MCV: 92.9 fL (ref 80.0–100.0)
Monocytes Absolute: 1.3 10*3/uL — ABNORMAL HIGH (ref 0.1–1.0)
Monocytes Relative: 15 %
Neutro Abs: 6.3 10*3/uL (ref 1.7–7.7)
Neutrophils Relative %: 73 %
Platelets: 151 10*3/uL (ref 150–400)
RBC: 4.81 MIL/uL (ref 4.22–5.81)
RDW: 13.7 % (ref 11.5–15.5)
WBC: 8.7 10*3/uL (ref 4.0–10.5)
nRBC: 0 % (ref 0.0–0.2)

## 2019-03-19 LAB — COMPREHENSIVE METABOLIC PANEL
ALT: 25 U/L (ref 0–44)
AST: 26 U/L (ref 15–41)
Albumin: 4.1 g/dL (ref 3.5–5.0)
Alkaline Phosphatase: 139 U/L — ABNORMAL HIGH (ref 38–126)
Anion gap: 8 (ref 5–15)
BUN: 22 mg/dL (ref 8–23)
CO2: 25 mmol/L (ref 22–32)
Calcium: 9 mg/dL (ref 8.9–10.3)
Chloride: 104 mmol/L (ref 98–111)
Creatinine, Ser: 0.98 mg/dL (ref 0.61–1.24)
GFR calc Af Amer: >60 mL/min (ref >60)
GFR calc non Af Amer: >60 mL/min (ref >60)
Glucose, Bld: 152 mg/dL — ABNORMAL HIGH (ref 70–99)
Potassium: 3.8 mmol/L (ref 3.5–5.1)
Sodium: 137 mmol/L (ref 135–145)
Total Bilirubin: 0.8 mg/dL (ref 0.3–1.2)
Total Protein: 7 g/dL (ref 6.5–8.1)

## 2019-03-19 MED ORDER — LANREOTIDE ACETATE 120 MG/0.5ML ~~LOC~~ SOLN
120.0000 mg | Freq: Once | SUBCUTANEOUS | Status: AC
  Administered 2019-03-19: 11:00:00 120 mg via SUBCUTANEOUS
  Filled 2019-03-19: qty 120

## 2019-03-21 ENCOUNTER — Other Ambulatory Visit: Payer: Self-pay

## 2019-03-21 ENCOUNTER — Encounter: Payer: Self-pay | Admitting: *Deleted

## 2019-03-21 ENCOUNTER — Ambulatory Visit
Admission: RE | Admit: 2019-03-21 | Discharge: 2019-03-21 | Disposition: A | Discharge status: Home / Self Care | Payer: Medicare Other | Source: Ambulatory Visit | Attending: Interventional Radiology | Admitting: Interventional Radiology

## 2019-03-21 DIAGNOSIS — C7B8 Other secondary neuroendocrine tumors: Secondary | ICD-10-CM

## 2019-03-21 DIAGNOSIS — C7A8 Other malignant neuroendocrine tumors: Secondary | ICD-10-CM

## 2019-03-21 HISTORY — PX: IR RADIOLOGIST EVAL & MGMT: IMG5224

## 2019-03-21 LAB — CHROMOGRANIN A: Chromogranin A (ng/mL): 254.7 ng/mL — ABNORMAL HIGH (ref 0.0–101.8)

## 2019-03-21 NOTE — Progress Notes (Signed)
Patient ID: Jason Malone, male   DOB: 11-22-1940, 78 y.o.   MRN: [id number]       Chief Complaint:  11 months status post Y 90 radio embolization for metastatic neuroendocrine tumor to the liver  Referring Physician(s): [place]  History of Present Illness: Jason Malone is a 78 y.o. male with stage IV metastatic neuroendocrine tumor to the liver.  He is now 100-month status post right hepatic Y 90 radioembolization.  He also had a previous left hepatic Y 90 embolization.  So now, he is status post whole liver treatment.  He continues to do very well.  Excellent functional status.  Slight weight loss but stable appetite.  No current abdominal pain, flank pain, weakness or fatigue.  No signs of liver failure.  No significant diarrhea symptoms or flushing syndrome.  He remains on monthly Sandostatin injections.  No new complaints.  Continues to walk daily and do yard work.  Past Medical History:  Diagnosis Date  . Allergic rhinitis   . Benign prostatic hypertrophy with lower urinary tract symptoms (LUTS)   . Chest pain    a. 05/2013 MV: EF 70%, no ischemia.  . Diverticulitis    DIVERTICULOSIS  . Enlarged prostate   . Essential hypertension    CONTROLLED ON MEDS  . Full dentures    upper and lower  . H/O hypokalemia   . Hx of atrial fibrillation without current medication    CARDIOLOGIST-DR [facility]  . 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 Hyperlipemia   . Hypomagnesemia   . IFG (impaired fasting glucose)   . Liver cancer ([place])   . Liver cancer ([place]) 12/08/2016  . Liver mass, left lobe 08/28/2016   Probably hemangioma; will get MRI of liver, refer to GI  . Lung cancer ([place])    a. carcinoid, left lung, Stage 1b (T2a, N0, cM0);  b. 05/2013 s/p VATS & LULobectomy.  . Monocytosis   . Osteopenia   . Paroxysmal atrial flutter ([facility])    a. 03/2013->no recurrence;  b. CHA2DS2VASc = 2-->not currently on anticoagulation;  c. 05/2012 Echo: EF 55-60%, normal RV.   [name] Wears glasses     Past Surgical History:  Procedure Laterality Date  . BRONCHOSCOPY     04/06/2013  . CARDIOVASCULAR STRESS TEST  05/2013   a. No evidence of ischemia or infarct, EF 70%, no WMAs  . CATARACT EXTRACTION W/PHACO Left 07/14/2015   Procedure: CATARACT EXTRACTION PHACO AND INTRAOCULAR LENS PLACEMENT (IOC);  Surgeon: [name], MD;  Location: [place];  Service: Ophthalmology;  Laterality: Left;  . CATARACT EXTRACTION W/PHACO Right 10/01/2015   Procedure: CATARACT EXTRACTION PHACO AND INTRAOCULAR LENS PLACEMENT (IOC);  Surgeon: [name], MD;  Location: [place];  Service: Ophthalmology;  Laterality: Right;  . COLONOSCOPY W/ POLYPECTOMY     bleed after-had to go to surgery to stop bleeding via colonoscopy  . COLONOSCOPY WITH PROPOFOL N/A 11/19/2015   Procedure: COLONOSCOPY WITH PROPOFOL;  Surgeon: [name], MD;  Location: [facility] ENDOSCOPY;  Service: Endoscopy;  Laterality: N/A;  . DUPUYTREN CONTRACTURE RELEASE  12/15/2011   Procedure: DUPUYTREN CONTRACTURE RELEASE;  Surgeon: [name], MD;  Location: [place];  Service: Orthopedics;  Laterality: Left;  Fasciotomy left ring finger dupuytrens  . FASCIECTOMY Right 11/07/2018   Procedure: SEGMENTAL FASCIECTOMY RIGHT RING FINGER;  Surgeon: Daryll Brod, MD;  Location: [place];  Service: Orthopedics;  Laterality: Right;  AXILLARY BLOCK  .  IR ANGIOGRAM SELECTIVE EACH ADDITIONAL VESSEL  07/26/2017  . IR ANGIOGRAM SELECTIVE EACH ADDITIONAL VESSEL  07/26/2017  . IR ANGIOGRAM SELECTIVE EACH ADDITIONAL VESSEL  07/26/2017  . IR ANGIOGRAM SELECTIVE EACH ADDITIONAL VESSEL  07/26/2017  . IR ANGIOGRAM SELECTIVE EACH ADDITIONAL VESSEL  07/26/2017  . IR ANGIOGRAM SELECTIVE EACH ADDITIONAL VESSEL  08/11/2017  . IR ANGIOGRAM SELECTIVE EACH ADDITIONAL VESSEL  04/28/2018  . IR ANGIOGRAM VISCERAL SELECTIVE  07/26/2017  . IR ANGIOGRAM VISCERAL SELECTIVE  08/11/2017  . IR ANGIOGRAM  VISCERAL SELECTIVE  04/28/2018  . IR EMBO ARTERIAL NOT HEMORR HEMANG INC GUIDE ROADMAPPING  07/26/2017  . IR EMBO TUMOR ORGAN ISCHEMIA INFARCT INC GUIDE ROADMAPPING  08/11/2017  . IR EMBO TUMOR ORGAN ISCHEMIA INFARCT INC GUIDE ROADMAPPING  04/28/2018  . IR RADIOLOGIST EVAL & MGMT  07/07/2017  . IR RADIOLOGIST EVAL & MGMT  09/07/2017  . IR RADIOLOGIST EVAL & MGMT  12/21/2017  . IR RADIOLOGIST EVAL & MGMT  03/22/2018  . IR RADIOLOGIST EVAL & MGMT  06/08/2018  . IR RADIOLOGIST EVAL & MGMT  11/01/2018  . IR US GUIDE VASC ACCESS RIGHT  07/26/2017  . IR US GUIDE VASC ACCESS RIGHT  08/11/2017  . IR US GUIDE VASC ACCESS RIGHT  04/28/2018  . LUNG LOBECTOMY  06/10/13   upper left lung  . MULTIPLE TOOTH EXTRACTIONS    . REMOVAL RETAINED LENS Right 11/17/2015   Procedure: REMOVAL RETAINED LENS FROAGMENTS RIGHT EYE;  Surgeon: [name], MD;  Location: [place];  Service: Ophthalmology;  Laterality: Right;  . TONSILLECTOMY    . UPPER GASTROINTESTINAL ENDOSCOPY  11-03-15   Dr [name]  . VASECTOMY    . VIDEO ASSISTED THORACOSCOPY (VATS)/WEDGE RESECTION Left 06/04/2013   Procedure: VIDEO ASSISTED THORACOSCOPY (VATS)/WEDGE RESECTION;  Surgeon: [name], MD;  Location: [place];  Service: Thoracic;  Laterality: Left;  [name] VIDEO BRONCHOSCOPY N/A 06/04/2013   Procedure: VIDEO BRONCHOSCOPY;  Surgeon: [name], MD;  Location[facility] OR;  Service: Thoracic;  Laterality: N/A;    Allergies: Latex and Tape  Medications: Prior to Admission medications   Medication Sig Start Date End Date Taking? Authorizing Provider  amLODipine (NORVASC) 2.5 MG tablet Take 1 tablet by mouth once daily 01/01/19   Minna Merritts, MD  atorvastatin (LIPITOR) 40 MG tablet TAKE 1 TABLET BY MOUTH ONCE DAILY FOR CHOLESTEROL 10/10/18   Poulose, Bethel Born, NP  b complex vitamins capsule Take 1 capsule by mouth daily. AM    [provider]  Calcium Carbonate-Vitamin D3 (CALCIUM 600/VITAMIN D) 600-400 MG-UNIT  TABS Take by mouth daily.    [provider]  Cinnamon 500 MG capsule Take 500 mg by mouth daily. AM AND BEDTIME    [provider]  fexofenadine (ALLEGRA) 180 MG tablet Take 180 mg by mouth daily.    [provider]  finasteride (PROSCAR) 5 MG tablet Take 1 tablet (5 mg total) by mouth daily. 10/10/18   Poulose, Bethel Born, NP  LANREOTIDE ACETATE [place] Inject 1 Dose into the skin every 30 (thirty) days.    [provider]  losartan (COZAAR) 100 MG tablet Take 1 tablet by mouth once daily 01/22/19   Minna Merritts, MD  Multiple Vitamins-Minerals (MULTIVITAMIN WITH MINERALS) tablet Take 1 tablet by mouth daily. AM    [provider]  polycarbophil (FIBERCON) 625 MG tablet Take 625 mg by mouth daily. AM    [provider]  polyethylene glycol (MIRALAX / GLYCOLAX) packet Take 17  g by mouth daily as needed.     [provider]  tamsulosin (FLOMAX) 0.4 MG CAPS capsule Take 1 capsule (0.4 mg total) by mouth daily. 10/10/18   Poulose, Bethel Born, NP  vitamin C (ASCORBIC ACID) 500 MG tablet Take 500 mg by mouth daily. AM    [provider]     Family History  Problem Relation Age of Onset  . Stroke Mother   . Alzheimer's disease Mother   . Hypertension Sister   . Hyperlipidemia Sister   . Hypertension Brother   . Hyperlipidemia Brother   . Diabetes Brother     Social History   Socioeconomic History  . Marital status: Married    Spouse name: [name]  . Number of children: 3  . Years of education: Not on file  . Highest education level: High school graduate  Occupational History  . Not on file  Social Needs  . Financial resource strain: Not hard at all  . Food insecurity    Worry: Never true    Inability: Never true  . Transportation needs    Medical: No    Non-medical: No  Tobacco Use  . Smoking status: Former Smoker    Packs/day: 1.00    Years: 40.00    Pack years: 40.00    Types: Cigarettes    Quit date:  12/09/1998    Years since quitting: 20.2  . Smokeless tobacco: Never Used  Substance and Sexual Activity  . Alcohol use: No  . Drug use: No  . Sexual activity: Yes    Partners: Female  Lifestyle  . Physical activity    Days per week: 7 days    Minutes per session: 60 min  . Stress: Not at all  Relationships  . Social connections    Talks on phone: More than three times a week    Gets together: More than three times a week    Attends religious service: Never    Active member of club or organization: No    Attends meetings of clubs or organizations: Never    Relationship status: Married  Other Topics Concern  . Not on file  Social History Narrative  . Not on file    ECOG Status: 1 - Symptomatic but completely ambulatory  Review of Systems  Review of Systems: A 12 point ROS discussed and pertinent positives are indicated in the HPI above.  All other systems are negative.  Physical Exam No direct physical exam was performed, telephone visit only today because of Covid pandemic. Vital Signs: There were no vitals taken for this visit.  Imaging: Mr Abdomen W Wo Contrast  Result Date: 03/15/2019 CLINICAL DATA:  Follow-up neuroendocrine carcinoma EXAM: MRI ABDOMEN WITHOUT AND WITH CONTRAST TECHNIQUE: Multiplanar multisequence MR imaging of the abdomen was performed both before and after the administration of intravenous contrast. CONTRAST:  34mL GADAVIST GADOBUTROL 1 MMOL/ML IV SOLN COMPARISON:  09/15/2018 FINDINGS: Lower chest: Incidental imaging of the lung bases is unremarkable, limited assessment on MRI. Hepatobiliary: Multiple hepatic lesions. Lesions are best seen on diffusion-weighted imaging. The largest in the lateral segment of the left hepatic lobe along the inter lobar fissure measures 2.1 x 2.0 cm (image 30, series 19) this is difficult to compare to the most recent study as there was extensive motion artifact on the most recent prior. Internal enhancing characteristics and  lesion size with slight increase in central enhancement but similar to slightly decreased size dating back to November of 2019. There is  more enhancement along the inferior margin of the lesion then there as along the superior margin. Pericapsular lesion along the margin of hepatic segment VIII measures 1.8 cm in greatest axial dimension, approximately 2.3 cm in greatest axial dimension in November of 2019. When assessing diffusion-weighted sequences there is more robust diffusion along the inferior margin of the dominant left lobe the lesion than there was in November of 2019 and likely slight increase in solid diffusion restricting and enhancing area along the inferior margin of this area since May of 2020 as well; as measured on diffusion-weighted sequences, the areas measure phenomenon on today's exam best seen on image 83 of series 7, stable area of restricted diffusion in visible on later phase post-contrast images in hepatic segment VI is stable. Approximately 1.5 as compared to 1.0 cm. Lesion near the dome of the liver on diffusion shows subtle central restricted diffusion, closer to its appearance in the examination of May of 2020 but with improvement when compared to the study of November of 2019. Pancreas: No mass, inflammatory changes, or other parenchymal abnormality identified. Spleen:  Stable small splenic lesion. Adrenals/Urinary Tract: Normal adrenals, mild cortical scarring and left renal atrophy. Stomach/Bowel: No signs of acute gastrointestinal process. Signs of colonic diverticulosis. Vascular/Lymphatic: Signs of atheromatous change in the abdominal aorta, no signs of aneurysm. Other:  No signs of ascites. Musculoskeletal: No signs of destructive bone process or metastatic disease. IMPRESSION: 1. Multiple hepatic lesions are stable to slightly decreased in size dating back to November of 2019. The largest lesion in the lateral segment of the left hepatic lobe is stable to slightly decreased in  size dating back to November of 2019. There is however a solid component in the inferior aspect of the lesion that may show more restricted diffusion in slight increase size, attention on follow-up. 2. No new sites of metastatic disease in the abdomen. 3. Stable small splenic lesion likely hemangioma or hamartoma. 4. Colonic diverticulosis. Electronically Signed   By: Zetta Bills M.D.   On: 03/15/2019 15:29    Labs:  CBC: Recent Labs    12/21/18 1055 01/18/19 1113 02/15/19 1027 03/19/19 1008  WBC 7.9 8.4 8.4 8.7  HGB 14.6 14.6 14.8 14.5  HCT 43.7 43.9 44.6 44.7  PLT 163 154 162 151    COAGS: Recent Labs    04/28/18 0750  INR 0.91    BMP: Recent Labs    12/21/18 1055 01/18/19 1113 02/15/19 1027 03/19/19 1008  NA 141 140 140 137  K 4.1 4.7 4.4 3.8  CL 107 105 107 104  CO2 25 29 26 25   GLUCOSE 100* 117* 120* 152*  BUN 22 18 22 22   CALCIUM 9.4 9.4 9.3 9.0  CREATININE 0.78 1.07 0.94 0.98  GFRNONAA >60 >60 >60 >60  GFRAA >60 >60 >60 >60    LIVER FUNCTION TESTS: Recent Labs    12/21/18 1055 01/18/19 1113 02/15/19 1027 03/19/19 1008  BILITOT 1.0 0.8 0.7 0.8  AST 28 28 32 26  ALT 29 30 36 25  ALKPHOS 164* 153* 142* 139*  PROT 6.9 6.7 6.9 7.0  ALBUMIN 4.0 4.5 3.9 4.1    TUMOR MARKERS: Recent Labs    04/28/18 1950 05/11/18 1053 07/06/18 1127  CHROMGRNA 4 5 3     Assessment and Plan:  11 months status post right hepatic Y 90 radio embolization for metastatic neuroendocrine tumor to the liver.  He is now status post whole liver treatment.  Stable MRI without interval growth or disease  progression in the liver.  No new hepatic disease.  Plan: Continue monthly Sandostatin injections.  repeat MRI in 6 months at [facility].  Thank you for this interesting consult.  I greatly enjoyed meeting Trejuan W Mittleman and look forward to participating in their care.  A copy of this report was sent to the requesting provider on this date.   Electronically Signed: Greggory Keen 03/21/2019, 11:05 AM   I spent a total of    25 Minutes in remote  clinical consultation, greater than 50% of which was counseling/coordinating care for this patient with metastatic neuroendocrine tumor to the liver.    Visit type: Audio only (telephone). Audio (no video) only due to patient's lack of internet/smartphone capability. Alternative for in-person consultation at [facility], [address] [place], [place], [place]. This visit type was conducted due to national recommendations for restrictions regarding the COVID-19 Pandemic (e.g. social distancing).  This format is felt to be most appropriate for this patient at this time.  All issues noted in this document were discussed and addressed.

## 2019-03-23 ENCOUNTER — Ambulatory Visit: Payer: Medicare Other

## 2019-03-23 ENCOUNTER — Other Ambulatory Visit: Payer: Medicare Other

## 2019-03-23 ENCOUNTER — Ambulatory Visit: Payer: Medicare Other | Admitting: Oncology

## 2019-04-11 ENCOUNTER — Other Ambulatory Visit: Payer: Self-pay

## 2019-04-11 ENCOUNTER — Encounter: Payer: Self-pay | Admitting: Family Medicine

## 2019-04-11 ENCOUNTER — Ambulatory Visit (INDEPENDENT_AMBULATORY_CARE_PROVIDER_SITE_OTHER): Payer: Medicare Other | Admitting: Family Medicine

## 2019-04-11 VITALS — BP 120/64 | HR 70 | Temp 98.5°F | Resp 14 | Ht 64.0 in | Wt 149.7 lb

## 2019-04-11 DIAGNOSIS — E782 Mixed hyperlipidemia: Secondary | ICD-10-CM | POA: Diagnosis not present

## 2019-04-11 DIAGNOSIS — E119 Type 2 diabetes mellitus without complications: Secondary | ICD-10-CM | POA: Diagnosis not present

## 2019-04-11 DIAGNOSIS — I10 Essential (primary) hypertension: Secondary | ICD-10-CM

## 2019-04-11 DIAGNOSIS — N401 Enlarged prostate with lower urinary tract symptoms: Secondary | ICD-10-CM

## 2019-04-11 DIAGNOSIS — R3911 Hesitancy of micturition: Secondary | ICD-10-CM

## 2019-04-11 DIAGNOSIS — J309 Allergic rhinitis, unspecified: Secondary | ICD-10-CM

## 2019-04-11 DIAGNOSIS — I700 Atherosclerosis of aorta: Secondary | ICD-10-CM | POA: Diagnosis not present

## 2019-04-11 MED ORDER — LOSARTAN POTASSIUM 100 MG PO TABS: 100.0000 mg | ORAL_TABLET | Freq: Every day | ORAL | 1 refills | Status: DC

## 2019-04-11 MED ORDER — FINASTERIDE 5 MG PO TABS: 5.0000 mg | ORAL_TABLET | Freq: Every day | ORAL | 1 refills | Status: DC

## 2019-04-11 MED ORDER — TAMSULOSIN HCL 0.4 MG PO CAPS: 0.4000 mg | ORAL_CAPSULE | Freq: Every day | ORAL | 1 refills | Status: DC

## 2019-04-11 MED ORDER — ATORVASTATIN CALCIUM 40 MG PO TABS: ORAL_TABLET | ORAL | 1 refills | Status: DC

## 2019-04-11 NOTE — Progress Notes (Signed)
Name: Jason Malone   MRN: [id number]    DOB: Nov 03, 1940   Date:04/11/2019       Progress Note  Chief Complaint  Patient presents with  . Follow-up  . Hypertension  . Hyperlipidemia     Subjective:   Jason Malone is a 78 y.o. male, presents to clinic for routine follow up on the conditions listed above.  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  Did review his last labs, CMP showed normal LFTs except for mildly elevated alk phos, renal function and electrolytes were good  Hypertension:  Currently managed on norvasc 2.5 mg losartan 100 mg  Pt reports good med compliance and denies any SE.  No lightheadedness, hypotension, syncope. Blood pressure today is well controlled. BP Readings from Last 3 Encounters:  04/11/19 120/64  03/19/19 132/69  03/12/19 110/70  Pt denies CP, SOB, exertional sx, LE edema, palpitation, Ha's, visual disturbances Dietary efforts for BP?  None - but he doesn't add extra salt to foods   Hyperlipidemia: Current Medication Regimen:  lipitor 40 mg  Last Lipids: Lab Results  Component Value Date   CHOL 96 10/10/2018   HDL 40 10/10/2018   LDLCALC 38 10/10/2018   TRIG 95 10/10/2018   CHOLHDL 2.4 10/10/2018   - Current Diet:   Oatmeal and salads, but otherwise no other diet restrictions - Denies: Chest pain, shortness of breath, myalgias. - Documented aortic atherosclerosis? Yes - Risk factors for atherosclerosis: diabetes mellitus, hypercholesterolemia and hypertension  Diabetes Mellitus Type II: Currently managing with lifestyle   No hypoglycemic episodes - he checks his CBG's 80's-120's usually, and occasional higher around 140 if he "was bad" Denies: Polyuria, polydipsia, polyphagia, vision changes, or neuropathy  Recent pertinent labs: Lab Results  Component Value Date   HGBA1C 6.3 (H) 10/10/2018   HGBA1C 6.5 (H) 04/10/2018   HGBA1C 6.4 (H) 10/04/2017       Component Value Date/Time   NA 137 03/19/2019 1008   NA 143 12/17/2016 1423   NA 139 03/30/2013 0519   K 3.8 03/19/2019 1008   K 3.4 (L) 03/30/2013 0519   CL 104 03/19/2019 1008   CL 108 (H) 03/30/2013 0519   CO2 25 03/19/2019 1008   CO2 24 03/30/2013 0519   GLUCOSE 152 (H) 03/19/2019 1008   GLUCOSE 119 (H) 03/30/2013 0519   BUN 22 03/19/2019 1008   BUN 17 12/17/2016 1423   BUN 13 03/30/2013 0519   CREATININE 0.98 03/19/2019 1008   CREATININE 0.88 02/12/2016 1103   CALCIUM 9.0 03/19/2019 1008   CALCIUM 8.8 03/30/2013 0519   PROT 7.0 03/19/2019 1008   PROT 6.6 12/17/2016 1423   PROT 6.7 03/28/2013 2155   ALBUMIN 4.1 03/19/2019 1008   ALBUMIN 4.4 12/17/2016 1423   ALBUMIN 3.2 (L) 03/28/2013 2155   AST 26 03/19/2019 1008   AST 22 03/28/2013 2155   ALT 25 03/19/2019 1008   ALT 26 03/28/2013 2155   ALKPHOS 139 (H) 03/19/2019 1008   ALKPHOS 101 03/28/2013 2155   BILITOT 0.8 03/19/2019 1008   BILITOT 0.5 12/17/2016 1423   BILITOT 0.4 03/28/2013 2155   GFRNONAA >60 03/19/2019 1008   GFRNONAA 85 02/12/2016 1103   GFRAA >60 03/19/2019 1008   GFRAA >89 02/12/2016 1103    UTD on DM foot exam and eye exam - foot exam due but deferred today, eye exam they will call and schedule.  Hx of  microalbuminemia -  we will recheck urine microalbumin today ACEI/ARB: Yes Statin: Yes      Patient Active Problem List   Diagnosis Date Noted  . Contracture of palmar fascia 10/11/2018  . Microalbuminuria due to type 2 diabetes mellitus ([place]) 04/12/2018  . Type 2 diabetes mellitus ([place]) 04/10/2018  . Osteopenia determined by x-ray 01/07/2017  . Neuroendocrine carcinoma of lung ([place]) 12/28/2016  . Low back pain 12/17/2016  . Liver mass, left lobe 08/28/2016  . 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 History of lung cancer 06/29/2016  . Aortic atherosclerosis ([place]) 04/06/2016  . Coronary artery disease involving native heart without angina pectoris 04/06/2016  .  History of colonic polyps 10/28/2015  . Medication monitoring encounter 08/15/2015  . Paroxysmal atrial flutter ([place])   . Elevated serum alkaline phosphatase level 03/05/2015  . Allergic rhinitis   . BPH without urinary obstruction   . Hyperlipidemia 02/13/2014  . Sinus bradycardia 07/18/2013  . S/P lobectomy of lung 06/04/2013  . Atrial flutter ([place]) 04/09/2013  . Smoking history 04/09/2013  . Essential hypertension 04/09/2013  . Rectal bleeding 12/15/2012    Past Surgical History:  Procedure Laterality Date  . BRONCHOSCOPY     04/06/2013  . CARDIOVASCULAR STRESS TEST  05/2013   a. No evidence of ischemia or infarct, EF 70%, no WMAs  . CATARACT EXTRACTION W/PHACO Left 07/14/2015   Procedure: CATARACT EXTRACTION PHACO AND INTRAOCULAR LENS PLACEMENT (IOC);  Surgeon: [name], MD;  Location: [facility];  Service: Ophthalmology;  Laterality: Left;  . CATARACT EXTRACTION W/PHACO Right 10/01/2015   Procedure: CATARACT EXTRACTION PHACO AND INTRAOCULAR LENS PLACEMENT (IOC);  Surgeon: [name], MD;  Location: [facility];  Service: Ophthalmology;  Laterality: Right;  . COLONOSCOPY W/ POLYPECTOMY     bleed after-had to go to surgery to stop bleeding via colonoscopy  . COLONOSCOPY WITH PROPOFOL N/A 11/19/2015   Procedure: COLONOSCOPY WITH PROPOFOL;  Surgeon: [name], MD;  Location: [facility] ENDOSCOPY;  Service: Endoscopy;  Laterality: N/A;  . DUPUYTREN CONTRACTURE RELEASE  12/15/2011   Procedure: DUPUYTREN CONTRACTURE RELEASE;  Surgeon: [name], MD;  Location: [facility];  Service: Orthopedics;  Laterality: Left;  Fasciotomy left ring finger dupuytrens  . FASCIECTOMY Right 11/07/2018   Procedure: SEGMENTAL FASCIECTOMY RIGHT RING FINGER;  Surgeon: Daryll Brod, MD;  Location: [facility];  Service: Orthopedics;  Laterality: Right;  AXILLARY BLOCK  . IR ANGIOGRAM SELECTIVE EACH ADDITIONAL VESSEL  07/26/2017  . IR ANGIOGRAM  SELECTIVE EACH ADDITIONAL VESSEL  07/26/2017  . IR ANGIOGRAM SELECTIVE EACH ADDITIONAL VESSEL  07/26/2017  . IR ANGIOGRAM SELECTIVE EACH ADDITIONAL VESSEL  07/26/2017  . IR ANGIOGRAM SELECTIVE EACH ADDITIONAL VESSEL  07/26/2017  . IR ANGIOGRAM SELECTIVE EACH ADDITIONAL VESSEL  08/11/2017  . IR ANGIOGRAM SELECTIVE EACH ADDITIONAL VESSEL  04/28/2018  . IR ANGIOGRAM VISCERAL SELECTIVE  07/26/2017  . IR ANGIOGRAM VISCERAL SELECTIVE  08/11/2017  . IR ANGIOGRAM VISCERAL SELECTIVE  04/28/2018  . IR EMBO ARTERIAL NOT HEMORR HEMANG INC GUIDE ROADMAPPING  07/26/2017  . IR EMBO TUMOR ORGAN ISCHEMIA INFARCT INC GUIDE ROADMAPPING  08/11/2017  . IR EMBO TUMOR ORGAN ISCHEMIA INFARCT INC GUIDE ROADMAPPING  04/28/2018  . IR RADIOLOGIST EVAL & MGMT  07/07/2017  . IR RADIOLOGIST EVAL & MGMT  09/07/2017  . IR RADIOLOGIST EVAL & MGMT  12/21/2017  . IR RADIOLOGIST EVAL & MGMT  03/22/2018  . IR RADIOLOGIST EVAL & MGMT  06/08/2018  . IR  RADIOLOGIST EVAL & MGMT  11/01/2018  . IR RADIOLOGIST EVAL & MGMT  03/21/2019  . IR US GUIDE VASC ACCESS RIGHT  07/26/2017  . IR US GUIDE VASC ACCESS RIGHT  08/11/2017  . IR US GUIDE VASC ACCESS RIGHT  04/28/2018  . LUNG LOBECTOMY  06/10/13   upper left lung  . MULTIPLE TOOTH EXTRACTIONS    . REMOVAL RETAINED LENS Right 11/17/2015   Procedure: REMOVAL RETAINED LENS FROAGMENTS RIGHT EYE;  Surgeon: [name], MD;  Location: [facility];  Service: Ophthalmology;  Laterality: Right;  . TONSILLECTOMY    . UPPER GASTROINTESTINAL ENDOSCOPY  11-03-15   Dr [name]  . VASECTOMY    . VIDEO ASSISTED THORACOSCOPY (VATS)/WEDGE RESECTION Left 06/04/2013   Procedure: VIDEO ASSISTED THORACOSCOPY (VATS)/WEDGE RESECTION;  Surgeon: [name], MD;  Location: [facility];  Service: Thoracic;  Laterality: Left;  [name] VIDEO BRONCHOSCOPY N/A 06/04/2013   Procedure: VIDEO BRONCHOSCOPY;  Surgeon: [name], MD;  Location: [facility] OR;  Service: Thoracic;  Laterality: N/A;    Family History  Problem  Relation Age of Onset  . Stroke Mother   . Alzheimer's disease Mother   . Hypertension Sister   . Hyperlipidemia Sister   . Hypertension Brother   . Hyperlipidemia Brother   . Diabetes Brother     Social History   Socioeconomic History  . Marital status: Married    Spouse name: [name]  . Number of children: 3  . Years of education: Not on file  . Highest education level: High school graduate  Occupational History  . Not on file  Social Needs  . Financial resource strain: Not hard at all  . Food insecurity    Worry: Never true    Inability: Never true  . Transportation needs    Medical: No    Non-medical: No  Tobacco Use  . Smoking status: Former Smoker    Packs/day: 1.00    Years: 40.00    Pack years: 40.00    Types: Cigarettes    Quit date: 12/09/1998    Years since quitting: 20.3  . Smokeless tobacco: Never Used  Substance and Sexual Activity  . Alcohol use: No  . Drug use: No  . Sexual activity: Yes    Partners: Female  Lifestyle  . Physical activity    Days per week: 7 days    Minutes per session: 60 min  . Stress: Not at all  Relationships  . Social connections    Talks on phone: More than three times a week    Gets together: More than three times a week    Attends religious service: Never    Active member of club or organization: No    Attends meetings of clubs or organizations: Never    Relationship status: Married  . Intimate partner violence    Fear of current or ex partner: No    Emotionally abused: No    Physically abused: No    Forced sexual activity: No  Other Topics Concern  . Not on file  Social History Narrative  . Not on file     Current Outpatient Medications:  .  amLODipine (NORVASC) 2.5 MG tablet, Take 1 tablet by mouth once daily, Disp: 90 tablet, Rfl: 3 .  atorvastatin (LIPITOR) 40 MG tablet, TAKE 1 TABLET BY MOUTH ONCE DAILY FOR CHOLESTEROL, Disp: 90 tablet, Rfl: 1 .  b complex vitamins capsule, Take 1 capsule by mouth daily.  AM, Disp: , Rfl:  .  Calcium Carbonate-Vitamin D3 (CALCIUM 600/VITAMIN D) 600-400 MG-UNIT TABS, Take by mouth daily., Disp: , Rfl:  .  Cinnamon 500 MG capsule, Take 500 mg by mouth daily. AM AND BEDTIME, Disp: , Rfl:  .  finasteride (PROSCAR) 5 MG tablet, Take 1 tablet (5 mg total) by mouth daily., Disp: 90 tablet, Rfl: 1 .  LANREOTIDE ACETATE [place], Inject 1 Dose into the skin every 30 (thirty) days., Disp: , Rfl:  .  losartan (COZAAR) 100 MG tablet, Take 1 tablet by mouth once daily, Disp: 90 tablet, Rfl: 0 .  Multiple Vitamins-Minerals (MULTIVITAMIN WITH MINERALS) tablet, Take 1 tablet by mouth daily. AM, Disp: , Rfl:  .  polycarbophil (FIBERCON) 625 MG tablet, Take 625 mg by mouth daily. AM, Disp: , Rfl:  .  polyethylene glycol (MIRALAX / GLYCOLAX) packet, Take 17 g by mouth daily as needed. , Disp: , Rfl:  .  tamsulosin (FLOMAX) 0.4 MG CAPS capsule, Take 1 capsule (0.4 mg total) by mouth daily., Disp: 90 capsule, Rfl: 1 .  vitamin C (ASCORBIC ACID) 500 MG tablet, Take 500 mg by mouth daily. AM, Disp: , Rfl:  .  fexofenadine (ALLEGRA) 180 MG tablet, Take 180 mg by mouth daily., Disp: , Rfl:  No current facility-administered medications for this visit.   Facility-Administered Medications Ordered in Other Visits:  .  lanreotide acetate (SOMATULINE DEPOT) injection 120 mg, 120 mg, Subcutaneous, Once, [place] Ormond, Kathlene November, MD  Allergies  Allergen Reactions  . Latex Itching    Tape only- REACTED TO BANDAGE ON ABDOMEN  . Tape Itching    Surgical tapes     I personally reviewed active problem list, medication list, allergies, family history, social history, health maintenance, notes from last encounter, lab results, imaging with the patient/caregiver today.  Review of Systems  Constitutional: Negative.   HENT: Negative.   Eyes: Negative.   Respiratory: Negative.   Cardiovascular: Negative.   Gastrointestinal: Negative.   Endocrine: Negative.   Genitourinary: Negative.   Musculoskeletal:  Negative.   Skin: Negative.   Allergic/Immunologic: Negative.   Neurological: Negative.   Hematological: Negative.   Psychiatric/Behavioral: Negative.   All other systems reviewed and are negative.    Objective:    Vitals:   04/11/19 1113  BP: 120/64  Pulse: 70  Resp: 14  Temp: 98.5 F (36.9 C)  SpO2: 96%  Weight: 149 lb 11.2 oz (67.9 kg)  Height: '5\' 4"'$  (1.626 m)    Body mass index is 25.7 kg/m.  Physical Exam Vitals signs and nursing note reviewed.  Constitutional:      General: He is not in acute distress.    Appearance: Normal appearance. He is well-developed. He is not toxic-appearing or diaphoretic.  HENT:     Head: Normocephalic and atraumatic.     Jaw: There is normal jaw occlusion. No trismus.     Right Ear: Tympanic membrane, ear canal and external ear normal.     Left Ear: Tympanic membrane, ear canal and external ear normal.     Nose: Mucosal edema present.     Right Turbinates: Enlarged and pale.     Left Turbinates: Enlarged and pale.     Right Sinus: No maxillary sinus tenderness or frontal sinus tenderness.     Left Sinus: No maxillary sinus tenderness or frontal sinus tenderness.     Mouth/Throat:     Mouth: Mucous membranes are moist. Mucous membranes are not pale, not dry and not cyanotic.     Pharynx: Uvula midline. Posterior oropharyngeal  erythema present. No pharyngeal swelling, oropharyngeal exudate or uvula swelling.     Tonsils: No tonsillar exudate or tonsillar abscesses.  Eyes:     General: Lids are normal.        Right eye: No discharge.        Left eye: No discharge.     Conjunctiva/sclera: Conjunctivae normal.     Pupils: Pupils are equal, round, and reactive to light.  Neck:     Musculoskeletal: Normal range of motion and neck supple.     Trachea: Trachea and phonation normal. No tracheal deviation.  Cardiovascular:     Rate and Rhythm: Normal rate and regular rhythm.     Pulses:          Radial pulses are 2+ on the right side  and 2+ on the left side.     Heart sounds: Normal heart sounds. No murmur. No friction rub. No gallop.   Pulmonary:     Effort: Pulmonary effort is normal. No tachypnea, accessory muscle usage or respiratory distress.     Breath sounds: Normal breath sounds. No stridor. No decreased breath sounds, wheezing, rhonchi or rales.  Abdominal:     General: Bowel sounds are normal. There is no distension.     Palpations: Abdomen is soft.     Tenderness: There is no abdominal tenderness.  Musculoskeletal: Normal range of motion.  Skin:    General: Skin is warm and dry.     Capillary Refill: Capillary refill takes less than 2 seconds.     Coloration: Skin is not pale.     Findings: No rash.     Nails: There is no clubbing.   Neurological:     Mental Status: He is alert and oriented to person, place, and time.     Motor: No abnormal muscle tone.     Coordination: Coordination normal.     Gait: Gait normal.  Psychiatric:        Speech: Speech normal.        Behavior: Behavior normal. Behavior is cooperative.      Recent Results (from the past 2160 hour(s))  Chromogranin A     Status: Abnormal   Collection Time: 01/18/19 11:13 AM  Result Value Ref Range   Chromogranin A (ng/mL) 241.2 (H) 0.0 - 101.8 ng/mL    Comment: (NOTE) Results of this test are labeled for research purposes only by the assay's manufacturer. The performance characteristics of this assay have not been established by the manufacturer. The result should not be used for treatment or for diagnostic purposes without confirmation of the diagnosis by another medically established diagnostic product or procedure. The performance characteristics were determined by [facility]. Chromogranin A performed by Thermofisher/BRAHMS KRYPTOR methodology Values obtained with different assay methods or kits cannot be used interchangeably. Performed At: [facility] [place], [place] [id number] Rush Farmer MD  NL:[id number]   Comprehensive metabolic panel     Status: Abnormal   Collection Time: 01/18/19 11:13 AM  Result Value Ref Range   Sodium 140 135 - 145 mmol/L   Potassium 4.7 3.5 - 5.1 mmol/L   Chloride 105 98 - 111 mmol/L   CO2 29 22 - 32 mmol/L   Glucose, Bld 117 (H) 70 - 99 mg/dL   BUN 18 8 - 23 mg/dL   Creatinine, Ser 1.07 0.61 - 1.24 mg/dL   Calcium 9.4 8.9 - 10.3 mg/dL   Total Protein 6.7 6.5 - 8.1 g/dL   Albumin 4.5 3.5 -  5.0 g/dL   AST 28 15 - 41 U/L   ALT 30 0 - 44 U/L   Alkaline Phosphatase 153 (H) 38 - 126 U/L   Total Bilirubin 0.8 0.3 - 1.2 mg/dL   GFR calc non Af Amer >60 >60 mL/min   GFR calc Af Amer >60 >60 mL/min   Anion gap 6 5 - 15    Comment: Performed at [facility], [address] [place], [place] [address]  CBC with Differential/Platelet     Status: Abnormal   Collection Time: 01/18/19 11:13 AM  Result Value Ref Range   WBC 8.4 4.0 - 10.5 K/uL   RBC 4.77 4.22 - 5.81 MIL/uL   Hemoglobin 14.6 13.0 - 17.0 g/dL   HCT 43.9 39.0 - 52.0 %   MCV 92.0 80.0 - 100.0 fL   MCH 30.6 26.0 - 34.0 pg   MCHC 33.3 30.0 - 36.0 g/dL   RDW 13.7 11.5 - 15.5 %   Platelets 154 150 - 400 K/uL   nRBC 0.0 0.0 - 0.2 %   Neutrophils Relative % 72 %   Neutro Abs 6.0 1.7 - 7.7 K/uL   Lymphocytes Relative 8 %   Lymphs Abs 0.7 0.7 - 4.0 K/uL   Monocytes Relative 16 %   Monocytes Absolute 1.4 (H) 0.1 - 1.0 K/uL   Eosinophils Relative 3 %   Eosinophils Absolute 0.2 0.0 - 0.5 K/uL   Basophils Relative 1 %   Basophils Absolute 0.1 0.0 - 0.1 K/uL   Immature Granulocytes 0 %   Abs Immature Granulocytes 0.03 0.00 - 0.07 K/uL    Comment: Performed at [facility], [place], [place] [address]  Chromogranin A     Status: Abnormal   Collection Time: 02/15/19 10:27 AM  Result Value Ref Range   Chromogranin A (ng/mL) 213.3 (H) 0.0 - 101.8 ng/mL    Comment: (NOTE) Results of this test are labeled for research purposes only by the assay's manufacturer. The  performance characteristics of this assay have not been established by the manufacturer. The result should not be used for treatment or for diagnostic purposes without confirmation of the diagnosis by another medically established diagnostic product or procedure. The performance characteristics were determined by [facility]. Chromogranin A performed by Thermofisher/BRAHMS KRYPTOR methodology Values obtained with different assay methods or kits cannot be used interchangeably. Performed At: [facility] [place], [place] [id number] Rush Farmer MD GG:[id number]   Comprehensive metabolic panel     Status: Abnormal   Collection Time: 02/15/19 10:27 AM  Result Value Ref Range   Sodium 140 135 - 145 mmol/L   Potassium 4.4 3.5 - 5.1 mmol/L   Chloride 107 98 - 111 mmol/L   CO2 26 22 - 32 mmol/L   Glucose, Bld 120 (H) 70 - 99 mg/dL   BUN 22 8 - 23 mg/dL   Creatinine, Ser 0.94 0.61 - 1.24 mg/dL   Calcium 9.3 8.9 - 10.3 mg/dL   Total Protein 6.9 6.5 - 8.1 g/dL   Albumin 3.9 3.5 - 5.0 g/dL   AST 32 15 - 41 U/L   ALT 36 0 - 44 U/L   Alkaline Phosphatase 142 (H) 38 - 126 U/L   Total Bilirubin 0.7 0.3 - 1.2 mg/dL   GFR calc non Af Amer >60 >60 mL/min   GFR calc Af Amer >60 >60 mL/min   Anion gap 7 5 - 15    Comment: Performed at [facility],  [place], [place] [address]  CBC with Differential/Platelet     Status: Abnormal   Collection Time: 02/15/19 10:27 AM  Result Value Ref Range   WBC 8.4 4.0 - 10.5 K/uL   RBC 4.84 4.22 - 5.81 MIL/uL   Hemoglobin 14.8 13.0 - 17.0 g/dL   HCT 44.6 39.0 - 52.0 %   MCV 92.1 80.0 - 100.0 fL   MCH 30.6 26.0 - 34.0 pg   MCHC 33.2 30.0 - 36.0 g/dL   RDW 13.4 11.5 - 15.5 %   Platelets 162 150 - 400 K/uL   nRBC 0.0 0.0 - 0.2 %   Neutrophils Relative % 70 %   Neutro Abs 5.8 1.7 - 7.7 K/uL   Lymphocytes Relative 9 %   Lymphs Abs 0.8 0.7 - 4.0 K/uL   Monocytes Relative 16 %   Monocytes Absolute 1.4 (H) 0.1 - 1.0  K/uL   Eosinophils Relative 3 %   Eosinophils Absolute 0.3 0.0 - 0.5 K/uL   Basophils Relative 2 %   Basophils Absolute 0.1 0.0 - 0.1 K/uL   Immature Granulocytes 0 %   Abs Immature Granulocytes 0.02 0.00 - 0.07 K/uL    Comment: Performed at [facility], [place], [place] [address]  Chromogranin A     Status: Abnormal   Collection Time: 03/19/19 10:08 AM  Result Value Ref Range   Chromogranin A (ng/mL) 254.7 (H) 0.0 - 101.8 ng/mL    Comment: (NOTE) Results of this test are labeled for research purposes only by the assay's manufacturer. The performance characteristics of this assay have not been established by the manufacturer. The result should not be used for treatment or for diagnostic purposes without confirmation of the diagnosis by another medically established diagnostic product or procedure. The performance characteristics were determined by [facility]. Chromogranin A performed by Thermofisher/BRAHMS KRYPTOR methodology Values obtained with different assay methods or kits cannot be used interchangeably. Performed At: [facility] [place], [place] [id number] Rush Farmer MD IW:[id number]   Comprehensive metabolic panel     Status: Abnormal   Collection Time: 03/19/19 10:08 AM  Result Value Ref Range   Sodium 137 135 - 145 mmol/L   Potassium 3.8 3.5 - 5.1 mmol/L   Chloride 104 98 - 111 mmol/L   CO2 25 22 - 32 mmol/L   Glucose, Bld 152 (H) 70 - 99 mg/dL   BUN 22 8 - 23 mg/dL   Creatinine, Ser 0.98 0.61 - 1.24 mg/dL   Calcium 9.0 8.9 - 10.3 mg/dL   Total Protein 7.0 6.5 - 8.1 g/dL   Albumin 4.1 3.5 - 5.0 g/dL   AST 26 15 - 41 U/L   ALT 25 0 - 44 U/L   Alkaline Phosphatase 139 (H) 38 - 126 U/L   Total Bilirubin 0.8 0.3 - 1.2 mg/dL   GFR calc non Af Amer >60 >60 mL/min   GFR calc Af Amer >60 >60 mL/min   Anion gap 8 5 - 15    Comment: Performed at [facility], [address] [place], [place] [address]  CBC with  Differential/Platelet     Status: Abnormal   Collection Time: 03/19/19 10:08 AM  Result Value Ref Range   WBC 8.7 4.0 - 10.5 K/uL   RBC 4.81 4.22 - 5.81 MIL/uL   Hemoglobin 14.5 13.0 - 17.0 g/dL   HCT 44.7 39.0 - 52.0 %   MCV 92.9 80.0 - 100.0 fL   MCH  30.1 26.0 - 34.0 pg   MCHC 32.4 30.0 - 36.0 g/dL   RDW 13.7 11.5 - 15.5 %   Platelets 151 150 - 400 K/uL   nRBC 0.0 0.0 - 0.2 %   Neutrophils Relative % 73 %   Neutro Abs 6.3 1.7 - 7.7 K/uL   Lymphocytes Relative 9 %   Lymphs Abs 0.8 0.7 - 4.0 K/uL   Monocytes Relative 15 %   Monocytes Absolute 1.3 (H) 0.1 - 1.0 K/uL   Eosinophils Relative 2 %   Eosinophils Absolute 0.2 0.0 - 0.5 K/uL   Basophils Relative 1 %   Basophils Absolute 0.1 0.0 - 0.1 K/uL   Immature Granulocytes 0 %   Abs Immature Granulocytes 0.02 0.00 - 0.07 K/uL    Comment: Performed at [facility], [address] [place], [place] [address]    PHQ2/9: Depression screen [facility] 2/9 04/11/2019 10/10/2018 04/24/2018 04/10/2018 10/04/2017  Decreased Interest 0 0 0 0 0  Down, Depressed, Hopeless 0 0 0 0 0  PHQ - 2 Score 0 0 0 0 0  Altered sleeping 0 0 - 0 -  Tired, decreased energy 0 0 - 0 -  Change in appetite 0 0 - 0 -  Feeling bad or failure about yourself  0 0 - 0 -  Trouble concentrating 0 0 - 0 -  Moving slowly or fidgety/restless 0 0 - 0 -  Suicidal thoughts 0 0 - 0 -  PHQ-9 Score 0 0 - 0 -  Difficult doing work/chores Not difficult at all Not difficult at all - Not difficult at all -  Some recent data might be hidden    phq 9 is negative Mood good, reviewed today  Fall Risk: Fall Risk  04/11/2019 10/10/2018 04/24/2018 04/10/2018 10/04/2017  Falls in the past year? 0 0 0 0 No  Number falls in past yr: 0 - - 0 -  Injury with Fall? 0 - - - -  Comment - - - - -    Functional Status Survey: Is the patient deaf or have difficulty hearing?: No Does the patient have difficulty seeing, even when wearing glasses/contacts?: No Does the patient have difficulty  concentrating, remembering, or making decisions?: No Does the patient have difficulty walking or climbing stairs?: No Does the patient have difficulty dressing or bathing?: No Does the patient have difficulty doing errands alone such as visiting a doctor's office or shopping?: No    Assessment & Plan:   1. Mixed hyperlipidemia Compliant with meds, no SE, no myalgias, fatigue or jaundice Due for FLP - recent CMP reviewed normal LFT's Diet and exercise recommendations for HLD reviewed and handout given - Lipid Panel - atorvastatin (LIPITOR) 40 MG tablet; TAKE 1 TABLET BY MOUTH ONCE DAILY FOR CHOLESTEROL  Dispense: 90 tablet; Refill: 1  2. Aortic atherosclerosis (HCC) - Lipid Panel  3. Essential hypertension Well controlled, stable  4. Type 2 diabetes mellitus without complication, without long-term current use of insulin (HCC) Diet controlled, well controlled and at goal with A1C's 6-6.5 - Microalbumin, urine - A1C  5. Benign prostatic hyperplasia with urinary hesitancy Pt asked for refills on his meds for BPH with [facility], states they work for him, and he denies any SE with meds. - finasteride (PROSCAR) 5 MG tablet; Take 1 tablet (5 mg total) by mouth daily.  Dispense: 90 tablet; Refill: 1 - tamsulosin (FLOMAX) 0.4 MG CAPS capsule; Take 1 capsule (0.4 mg total) by mouth daily.  Dispense: 90 capsule; Refill:  1  6. Allergic rhinitis, unspecified seasonality, unspecified trigger      tx with flonase and 2nd gen antihistamine daily     Return in about 6 months (around 10/10/2019) for Routine follow-up.   Delsa Grana, PA-C 04/11/19 11:30 AM

## 2019-04-12 LAB — MICROALBUMIN, URINE: Microalb, Ur: 0.6 mg/dL

## 2019-04-12 LAB — LIPID PANEL
Cholesterol: 114 mg/dL (ref <200)
HDL: 43 mg/dL (ref >=40)
LDL Cholesterol (Calc): 53 mg/dL (calc)
Non-HDL Cholesterol (Calc): 71 mg/dL (calc) (ref <130)
Total CHOL/HDL Ratio: 2.7 (calc) (ref <5.0)
Triglycerides: 96 mg/dL (ref <150)

## 2019-04-12 LAB — HEMOGLOBIN A1C
Hgb A1c MFr Bld: 6.3 % of total Hgb — ABNORMAL HIGH (ref <5.7)
Mean Plasma Glucose: 134 (calc)
eAG (mmol/L): 7.4 (calc)

## 2019-04-16 ENCOUNTER — Other Ambulatory Visit: Payer: Self-pay

## 2019-04-16 ENCOUNTER — Inpatient Hospital Stay: Payer: Medicare Other | Attending: Oncology

## 2019-04-16 ENCOUNTER — Inpatient Hospital Stay: Payer: Medicare Other

## 2019-04-16 DIAGNOSIS — N400 Enlarged prostate without lower urinary tract symptoms: Secondary | ICD-10-CM | POA: Diagnosis not present

## 2019-04-16 DIAGNOSIS — I10 Essential (primary) hypertension: Secondary | ICD-10-CM | POA: Insufficient documentation

## 2019-04-16 DIAGNOSIS — E785 Hyperlipidemia, unspecified: Secondary | ICD-10-CM | POA: Insufficient documentation

## 2019-04-16 DIAGNOSIS — C7A8 Other malignant neuroendocrine tumors: Secondary | ICD-10-CM

## 2019-04-16 DIAGNOSIS — Z79899 Other long term (current) drug therapy: Secondary | ICD-10-CM | POA: Insufficient documentation

## 2019-04-16 DIAGNOSIS — F17210 Nicotine dependence, cigarettes, uncomplicated: Secondary | ICD-10-CM | POA: Insufficient documentation

## 2019-04-16 DIAGNOSIS — M8580 Other specified disorders of bone density and structure, unspecified site: Secondary | ICD-10-CM | POA: Diagnosis not present

## 2019-04-16 DIAGNOSIS — I4891 Unspecified atrial fibrillation: Secondary | ICD-10-CM | POA: Diagnosis not present

## 2019-04-16 DIAGNOSIS — C7B02 Secondary carcinoid tumors of liver: Secondary | ICD-10-CM | POA: Diagnosis not present

## 2019-04-16 DIAGNOSIS — C7A090 Malignant carcinoid tumor of the bronchus and lung: Secondary | ICD-10-CM | POA: Diagnosis not present

## 2019-04-16 LAB — CBC WITH DIFFERENTIAL/PLATELET
Abs Immature Granulocytes: 0.02 10*3/uL (ref 0.00–0.07)
Basophils Absolute: 0.1 10*3/uL (ref 0.0–0.1)
Basophils Relative: 1 %
Eosinophils Absolute: 0.2 10*3/uL (ref 0.0–0.5)
Eosinophils Relative: 3 %
HCT: 43.6 % (ref 39.0–52.0)
Hemoglobin: 14.1 g/dL (ref 13.0–17.0)
Immature Granulocytes: 0 %
Lymphocytes Relative: 12 %
Lymphs Abs: 0.8 10*3/uL (ref 0.7–4.0)
MCH: 30 pg (ref 26.0–34.0)
MCHC: 32.3 g/dL (ref 30.0–36.0)
MCV: 92.8 fL (ref 80.0–100.0)
Monocytes Absolute: 1.2 10*3/uL — ABNORMAL HIGH (ref 0.1–1.0)
Monocytes Relative: 18 %
Neutro Abs: 4.4 10*3/uL (ref 1.7–7.7)
Neutrophils Relative %: 66 %
Platelets: 182 10*3/uL (ref 150–400)
RBC: 4.7 MIL/uL (ref 4.22–5.81)
RDW: 14.2 % (ref 11.5–15.5)
WBC: 6.8 10*3/uL (ref 4.0–10.5)
nRBC: 0 % (ref 0.0–0.2)

## 2019-04-16 LAB — COMPREHENSIVE METABOLIC PANEL
ALT: 27 U/L (ref 0–44)
AST: 30 U/L (ref 15–41)
Albumin: 4 g/dL (ref 3.5–5.0)
Alkaline Phosphatase: 140 U/L — ABNORMAL HIGH (ref 38–126)
Anion gap: 7 (ref 5–15)
BUN: 19 mg/dL (ref 8–23)
CO2: 26 mmol/L (ref 22–32)
Calcium: 9.2 mg/dL (ref 8.9–10.3)
Chloride: 106 mmol/L (ref 98–111)
Creatinine, Ser: 0.93 mg/dL (ref 0.61–1.24)
GFR calc Af Amer: >60 mL/min (ref >60)
GFR calc non Af Amer: >60 mL/min (ref >60)
Glucose, Bld: 95 mg/dL (ref 70–99)
Potassium: 4.3 mmol/L (ref 3.5–5.1)
Sodium: 139 mmol/L (ref 135–145)
Total Bilirubin: 0.9 mg/dL (ref 0.3–1.2)
Total Protein: 6.9 g/dL (ref 6.5–8.1)

## 2019-04-16 MED ORDER — LANREOTIDE ACETATE 120 MG/0.5ML ~~LOC~~ SOLN
120.0000 mg | Freq: Once | SUBCUTANEOUS | Status: AC
  Administered 2019-04-16: 11:00:00 120 mg via SUBCUTANEOUS
  Filled 2019-04-16: qty 120

## 2019-04-19 LAB — CHROMOGRANIN A: Chromogranin A (ng/mL): 265.4 ng/mL — ABNORMAL HIGH (ref 0.0–101.8)

## 2019-05-10 ENCOUNTER — Other Ambulatory Visit: Payer: Self-pay

## 2019-05-14 ENCOUNTER — Other Ambulatory Visit: Payer: Self-pay

## 2019-05-14 ENCOUNTER — Inpatient Hospital Stay: Payer: Medicare Other | Attending: Oncology

## 2019-05-14 ENCOUNTER — Inpatient Hospital Stay: Payer: Medicare Other

## 2019-05-14 DIAGNOSIS — C7A8 Other malignant neuroendocrine tumors: Secondary | ICD-10-CM

## 2019-05-14 DIAGNOSIS — Z79899 Other long term (current) drug therapy: Secondary | ICD-10-CM | POA: Diagnosis not present

## 2019-05-14 DIAGNOSIS — C7B8 Other secondary neuroendocrine tumors: Secondary | ICD-10-CM

## 2019-05-14 DIAGNOSIS — C7B02 Secondary carcinoid tumors of liver: Secondary | ICD-10-CM | POA: Insufficient documentation

## 2019-05-14 LAB — COMPREHENSIVE METABOLIC PANEL
ALT: 36 U/L (ref 0–44)
AST: 31 U/L (ref 15–41)
Albumin: 4.3 g/dL (ref 3.5–5.0)
Alkaline Phosphatase: 140 U/L — ABNORMAL HIGH (ref 38–126)
Anion gap: 6 (ref 5–15)
BUN: 21 mg/dL (ref 8–23)
CO2: 29 mmol/L (ref 22–32)
Calcium: 9.6 mg/dL (ref 8.9–10.3)
Chloride: 105 mmol/L (ref 98–111)
Creatinine, Ser: 0.95 mg/dL (ref 0.61–1.24)
GFR calc Af Amer: >60 mL/min (ref >60)
GFR calc non Af Amer: >60 mL/min (ref >60)
Glucose, Bld: 97 mg/dL (ref 70–99)
Potassium: 4.7 mmol/L (ref 3.5–5.1)
Sodium: 140 mmol/L (ref 135–145)
Total Bilirubin: 0.8 mg/dL (ref 0.3–1.2)
Total Protein: 7.2 g/dL (ref 6.5–8.1)

## 2019-05-14 LAB — CBC WITH DIFFERENTIAL/PLATELET
Abs Immature Granulocytes: 0.02 10*3/uL (ref 0.00–0.07)
Basophils Absolute: 0.1 10*3/uL (ref 0.0–0.1)
Basophils Relative: 1 %
Eosinophils Absolute: 0.3 10*3/uL (ref 0.0–0.5)
Eosinophils Relative: 3 %
HCT: 47.1 % (ref 39.0–52.0)
Hemoglobin: 14.9 g/dL (ref 13.0–17.0)
Immature Granulocytes: 0 %
Lymphocytes Relative: 8 %
Lymphs Abs: 0.8 10*3/uL (ref 0.7–4.0)
MCH: 29.8 pg (ref 26.0–34.0)
MCHC: 31.6 g/dL (ref 30.0–36.0)
MCV: 94.2 fL (ref 80.0–100.0)
Monocytes Absolute: 1.5 10*3/uL — ABNORMAL HIGH (ref 0.1–1.0)
Monocytes Relative: 16 %
Neutro Abs: 7 10*3/uL (ref 1.7–7.7)
Neutrophils Relative %: 72 %
Platelets: 170 10*3/uL (ref 150–400)
RBC: 5 MIL/uL (ref 4.22–5.81)
RDW: 14.6 % (ref 11.5–15.5)
WBC: 9.7 10*3/uL (ref 4.0–10.5)
nRBC: 0 % (ref 0.0–0.2)

## 2019-05-14 MED ORDER — LANREOTIDE ACETATE 120 MG/0.5ML ~~LOC~~ SOLN
120.0000 mg | Freq: Once | SUBCUTANEOUS | Status: AC
  Administered 2019-05-14: 120 mg via SUBCUTANEOUS
  Filled 2019-05-14: qty 120

## 2019-05-16 LAB — CHROMOGRANIN A: Chromogranin A (ng/mL): 258.7 ng/mL — ABNORMAL HIGH (ref 0.0–101.8)

## 2019-05-18 ENCOUNTER — Encounter: Payer: Self-pay | Admitting: Oncology

## 2019-06-07 ENCOUNTER — Encounter: Payer: Self-pay | Admitting: Family Medicine

## 2019-06-07 ENCOUNTER — Encounter: Payer: Self-pay | Admitting: Emergency Medicine

## 2019-06-07 ENCOUNTER — Ambulatory Visit (INDEPENDENT_AMBULATORY_CARE_PROVIDER_SITE_OTHER): Payer: Medicare Other

## 2019-06-07 ENCOUNTER — Other Ambulatory Visit: Payer: Self-pay

## 2019-06-07 ENCOUNTER — Ambulatory Visit (INDEPENDENT_AMBULATORY_CARE_PROVIDER_SITE_OTHER): Payer: Medicare Other | Admitting: Family Medicine

## 2019-06-07 ENCOUNTER — Ambulatory Visit (INDEPENDENT_AMBULATORY_CARE_PROVIDER_SITE_OTHER)
Admission: EM | Admit: 2019-06-07 | Discharge: 2019-06-07 | Disposition: A | Discharge status: Home / Self Care | Payer: Medicare Other | Source: Home / Self Care

## 2019-06-07 VITALS — BP 150/76 | HR 60 | Temp 97.0°F | Ht 64.0 in | Wt 145.0 lb

## 2019-06-07 DIAGNOSIS — C7A8 Other malignant neuroendocrine tumors: Secondary | ICD-10-CM

## 2019-06-07 DIAGNOSIS — R05 Cough: Secondary | ICD-10-CM

## 2019-06-07 DIAGNOSIS — M8580 Other specified disorders of bone density and structure, unspecified site: Secondary | ICD-10-CM | POA: Insufficient documentation

## 2019-06-07 DIAGNOSIS — J069 Acute upper respiratory infection, unspecified: Secondary | ICD-10-CM | POA: Diagnosis not present

## 2019-06-07 DIAGNOSIS — E785 Hyperlipidemia, unspecified: Secondary | ICD-10-CM | POA: Insufficient documentation

## 2019-06-07 DIAGNOSIS — Z8249 Family history of ischemic heart disease and other diseases of the circulatory system: Secondary | ICD-10-CM | POA: Insufficient documentation

## 2019-06-07 DIAGNOSIS — Z833 Family history of diabetes mellitus: Secondary | ICD-10-CM | POA: Insufficient documentation

## 2019-06-07 DIAGNOSIS — Z79899 Other long term (current) drug therapy: Secondary | ICD-10-CM | POA: Insufficient documentation

## 2019-06-07 DIAGNOSIS — Z87891 Personal history of nicotine dependence: Secondary | ICD-10-CM | POA: Insufficient documentation

## 2019-06-07 DIAGNOSIS — R197 Diarrhea, unspecified: Secondary | ICD-10-CM

## 2019-06-07 DIAGNOSIS — I4891 Unspecified atrial fibrillation: Secondary | ICD-10-CM | POA: Insufficient documentation

## 2019-06-07 DIAGNOSIS — I10 Essential (primary) hypertension: Secondary | ICD-10-CM | POA: Insufficient documentation

## 2019-06-07 DIAGNOSIS — K921 Melena: Secondary | ICD-10-CM

## 2019-06-07 DIAGNOSIS — I700 Atherosclerosis of aorta: Secondary | ICD-10-CM | POA: Insufficient documentation

## 2019-06-07 DIAGNOSIS — R195 Other fecal abnormalities: Secondary | ICD-10-CM | POA: Diagnosis not present

## 2019-06-07 DIAGNOSIS — U071 COVID-19: Secondary | ICD-10-CM | POA: Insufficient documentation

## 2019-06-07 DIAGNOSIS — Z888 Allergy status to other drugs, medicaments and biological substances status: Secondary | ICD-10-CM | POA: Insufficient documentation

## 2019-06-07 DIAGNOSIS — R110 Nausea: Secondary | ICD-10-CM | POA: Diagnosis not present

## 2019-06-07 DIAGNOSIS — Z8505 Personal history of malignant neoplasm of liver: Secondary | ICD-10-CM | POA: Insufficient documentation

## 2019-06-07 DIAGNOSIS — Z8349 Family history of other endocrine, nutritional and metabolic diseases: Secondary | ICD-10-CM | POA: Insufficient documentation

## 2019-06-07 DIAGNOSIS — R059 Cough, unspecified: Secondary | ICD-10-CM

## 2019-06-07 DIAGNOSIS — Z902 Acquired absence of lung [part of]: Secondary | ICD-10-CM | POA: Insufficient documentation

## 2019-06-07 DIAGNOSIS — Z823 Family history of stroke: Secondary | ICD-10-CM | POA: Insufficient documentation

## 2019-06-07 DIAGNOSIS — Z85118 Personal history of other malignant neoplasm of bronchus and lung: Secondary | ICD-10-CM | POA: Insufficient documentation

## 2019-06-07 DIAGNOSIS — Z91040 Latex allergy status: Secondary | ICD-10-CM | POA: Insufficient documentation

## 2019-06-07 DIAGNOSIS — I4892 Unspecified atrial flutter: Secondary | ICD-10-CM | POA: Insufficient documentation

## 2019-06-07 DIAGNOSIS — N401 Enlarged prostate with lower urinary tract symptoms: Secondary | ICD-10-CM | POA: Insufficient documentation

## 2019-06-07 DIAGNOSIS — Z9841 Cataract extraction status, right eye: Secondary | ICD-10-CM | POA: Insufficient documentation

## 2019-06-07 DIAGNOSIS — E119 Type 2 diabetes mellitus without complications: Secondary | ICD-10-CM | POA: Insufficient documentation

## 2019-06-07 DIAGNOSIS — I2510 Atherosclerotic heart disease of native coronary artery without angina pectoris: Secondary | ICD-10-CM | POA: Insufficient documentation

## 2019-06-07 LAB — CBC WITH DIFFERENTIAL/PLATELET
Abs Immature Granulocytes: 0.06 10*3/uL (ref 0.00–0.07)
Basophils Absolute: 0 10*3/uL (ref 0.0–0.1)
Basophils Relative: 0 %
Eosinophils Absolute: 0 10*3/uL (ref 0.0–0.5)
Eosinophils Relative: 0 %
HCT: 43.4 % (ref 39.0–52.0)
Hemoglobin: 14.6 g/dL (ref 13.0–17.0)
Immature Granulocytes: 1 %
Lymphocytes Relative: 7 %
Lymphs Abs: 0.5 10*3/uL — ABNORMAL LOW (ref 0.7–4.0)
MCH: 29.8 pg (ref 26.0–34.0)
MCHC: 33.6 g/dL (ref 30.0–36.0)
MCV: 88.6 fL (ref 80.0–100.0)
Monocytes Absolute: 1.3 10*3/uL — ABNORMAL HIGH (ref 0.1–1.0)
Monocytes Relative: 17 %
Neutro Abs: 5.5 10*3/uL (ref 1.7–7.7)
Neutrophils Relative %: 75 %
Platelets: 143 10*3/uL — ABNORMAL LOW (ref 150–400)
RBC: 4.9 MIL/uL (ref 4.22–5.81)
RDW: 14.6 % (ref 11.5–15.5)
WBC: 7.4 10*3/uL (ref 4.0–10.5)
nRBC: 0 % (ref 0.0–0.2)

## 2019-06-07 LAB — BASIC METABOLIC PANEL
Anion gap: 10 (ref 5–15)
BUN: 18 mg/dL (ref 8–23)
CO2: 25 mmol/L (ref 22–32)
Calcium: 8.5 mg/dL — ABNORMAL LOW (ref 8.9–10.3)
Chloride: 99 mmol/L (ref 98–111)
Creatinine, Ser: 0.89 mg/dL (ref 0.61–1.24)
GFR calc Af Amer: >60 mL/min (ref >60)
GFR calc non Af Amer: >60 mL/min (ref >60)
Glucose, Bld: 119 mg/dL — ABNORMAL HIGH (ref 70–99)
Potassium: 3.4 mmol/L — ABNORMAL LOW (ref 3.5–5.1)
Sodium: 134 mmol/L — ABNORMAL LOW (ref 135–145)

## 2019-06-07 LAB — OCCULT BLOOD X 1 CARD TO LAB, STOOL: Fecal Occult Bld: NEGATIVE

## 2019-06-07 NOTE — Progress Notes (Signed)
Name: Jason Malone   MRN: [id number]    DOB: February 01, 1941   Date:06/07/2019       Progress Note  Subjective:    Chief Complaint  Chief Complaint  Patient presents with  . Fever    onset 1 week, symptoms include: headache,cough, diarrhea, body aches,fatigue stool very black/green.  got covid vaccine on the jan14    I connected with  Jason Malone on 06/07/19 at  8:40 AM EST by telephone and verified that I am speaking with the correct person using two identifiers.   I discussed the limitations, risks, security and privacy concerns of performing an evaluation and management service by telephone and the availability of in person appointments. Staff also discussed with the patient that there may be a patient responsible charge related to this service. Patient Location: home Provider Location: [facility]  Additional Individuals present: pts wife also in background on the phonecall  HPI Patient presents with complaints of 1 week of cough, body aches, chills, fever, headache, fatigue and change in stools to loose and black and green in color.  He states his chills have improved, he denies any wheeze, chest pain or shortness of breath.  He states his stomach is very upset frequent loose stools but he denies any vomiting.  He is being treated by Dr. Grayland Malone for stage IV lung cancer, receiving injections monthly of lanreotide, when asked if he is on any medication that suppresses his immune system he says no, but his wife states they are hesitant to take him anywhere - including Dr offices due to covid.  They have not contacted their oncologist yet.  He also recently got his first Covid vaccination on January 14 and his symptoms started just over a week ago, within a week of his first Covid vaccine. He has no known sick contacts He denies any lethargy, confusion, difficulty getting around his home, near syncope, exertional shortness of breath    Patient Active Problem List   Diagnosis Date Noted  .  Contracture of palmar fascia 10/11/2018  . Microalbuminuria due to type 2 diabetes mellitus ([place]) 04/12/2018  . Type 2 diabetes mellitus ([place]) 04/10/2018  . Osteopenia determined by x-ray 01/07/2017  . 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 Low back pain 12/17/2016  . Liver mass, left lobe 08/28/2016  . 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 History of lung cancer 06/29/2016  . Aortic atherosclerosis ([place]) 04/06/2016  . Coronary artery disease involving native heart without angina pectoris 04/06/2016  . History of colonic polyps 10/28/2015  . Medication monitoring encounter 08/15/2015  . Paroxysmal atrial flutter ([place])   . Elevated serum alkaline phosphatase level 03/05/2015  . Allergic rhinitis   . BPH without urinary obstruction   . Hyperlipidemia 02/13/2014  . Sinus bradycardia 07/18/2013  . S/P lobectomy of lung 06/04/2013  . Atrial flutter ([place]) 04/09/2013  . Smoking history 04/09/2013  . Essential hypertension 04/09/2013  . Rectal bleeding 12/15/2012    Social History   Tobacco Use  . Smoking status: Former Smoker    Packs/day: 1.00    Years: 40.00    Pack years: 40.00    Types: Cigarettes    Quit date: 12/09/1998    Years since quitting: 20.5  . Smokeless tobacco: Never Used  Substance Use Topics  . Alcohol use: No     Current Outpatient Medications:  .  amLODipine (NORVASC) 2.5 MG tablet, Take 1 tablet by mouth once daily, Disp:  90 tablet, Rfl: 3 .  atorvastatin (LIPITOR) 40 MG tablet, TAKE 1 TABLET BY MOUTH ONCE DAILY FOR CHOLESTEROL, Disp: 90 tablet, Rfl: 1 .  b complex vitamins capsule, Take 1 capsule by mouth daily. AM, Disp: , Rfl:  .  Calcium Carbonate-Vitamin D3 (CALCIUM 600/VITAMIN D) 600-400 MG-UNIT TABS, Take by mouth daily., Disp: , Rfl:  .  Cinnamon 500 MG capsule, Take 500 mg by mouth daily. AM AND BEDTIME, Disp: , Rfl:  .  fexofenadine (ALLEGRA) 180 MG tablet, Take 180 mg by mouth daily., Disp: , Rfl:  .   finasteride (PROSCAR) 5 MG tablet, Take 1 tablet (5 mg total) by mouth daily., Disp: 90 tablet, Rfl: 1 .  LANREOTIDE ACETATE [place], Inject 1 Dose into the skin every 30 (thirty) days., Disp: , Rfl:  .  losartan (COZAAR) 100 MG tablet, Take 1 tablet (100 mg total) by mouth daily., Disp: 90 tablet, Rfl: 1 .  Multiple Vitamins-Minerals (MULTIVITAMIN WITH MINERALS) tablet, Take 1 tablet by mouth daily. AM, Disp: , Rfl:  .  polycarbophil (FIBERCON) 625 MG tablet, Take 625 mg by mouth daily. AM, Disp: , Rfl:  .  polyethylene glycol (MIRALAX / GLYCOLAX) packet, Take 17 g by mouth daily as needed. , Disp: , Rfl:  .  tamsulosin (FLOMAX) 0.4 MG CAPS capsule, Take 1 capsule (0.4 mg total) by mouth daily., Disp: 90 capsule, Rfl: 1 .  vitamin C (ASCORBIC ACID) 500 MG tablet, Take 500 mg by mouth daily. AM, Disp: , Rfl:  No current facility-administered medications for this visit.  Facility-Administered Medications Ordered in Other Visits:  .  lanreotide acetate (SOMATULINE DEPOT) injection 120 mg, 120 mg, Subcutaneous, Once, Jason Malone, Kathlene November, MD  Allergies  Allergen Reactions  . Latex Itching    Tape only- REACTED TO BANDAGE ON ABDOMEN  . Tape Itching    Surgical tapes     Chart Review: I personally reviewed active problem list, medication list, allergies, family history, social history, health maintenance, notes from last encounter, lab results, imaging with the patient/caregiver today.  Review of Systems  Constitutional: Negative.   HENT: Negative.   Eyes: Negative.   Respiratory: Negative.   Cardiovascular: Negative.   Gastrointestinal: Negative.   Endocrine: Negative.   Genitourinary: Negative.   Musculoskeletal: Negative.   Skin: Negative.   Allergic/Immunologic: Negative.   Neurological: Negative.   Hematological: Negative.   Psychiatric/Behavioral: Negative.   All other systems reviewed and are negative.    Objective:    Virtual encounter, vitals limited, only able to obtain  the following Today's Vitals   06/07/19 0818  BP: (!) 150/76  Pulse: 60  Temp: (!) 97 F (36.1 C)  SpO2: 97%  Weight: 145 lb (65.8 kg)  Height: 5\' 4"  (1.626 m)   Body mass index is 24.89 kg/m. Nursing Note and Vital Signs reviewed.  Physical Exam  Pt alert, phonation clear, answering questions appropriately, able to speak in short sentences, did believe I heard some audible wheeze on the phone call no audible distress PE limited by telephone encounter  No results found for this or any previous visit (from the past 72 hour(s)).  Assessment and Plan:     ICD-10-CM   1. Diarrhea, unspecified type  R19.7   2. Melena  K92.1   3. Cough  R05   4. 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  Melena is concerning to me I did  explain that we would need to test him for Covid likely get blood work because of his other medical history and test his stool possibly a chest x-ray.  He denied any alarming symptoms such as near syncope, chest pain, shortness of breath, lethargy and he is adamant about denying any blood in his stool but I did try to explain to him that the black stool needs to be tested for microscopic blood.  We discussed options of how to work this up today and did decide that referring him to urgent care may be most efficient to get everything done at once that they feels appropriate.  Patient and his wife state they will go to [facility] urgent care.    -Red flags and when to present for emergency care or RTC including but not limited to new/worsening/un-resolving symptoms,  reviewed with patient at time of visit. Follow up and care instructions discussed and provided in AVS. - I discussed the assessment and treatment plan with the patient. The patient was provided an opportunity to ask questions and all were answered. The patient agreed with the plan and demonstrated an understanding of  the instructions.  - The patient was advised to call back or seek an in-person evaluation if the symptoms worsen or if the condition fails to improve as anticipated.  I provided 15 minutes of non-face-to-face time during this encounter.  Delsa Grana, PA-C 06/07/19 9:14 AM

## 2019-06-07 NOTE — ED Triage Notes (Signed)
Patient states he had a COVID vaccine on 1/14. He started to get chills, bodyaches, cough, headache, intermittent fever, nausea and diarrhea. He states this has been going on for about 2 weeks. He states yesterday his stool was black in color.

## 2019-06-07 NOTE — Discharge Instructions (Addendum)
Get plenty of rest.  Drink adequate fluids.  Use Tylenol or Motrin for body aches or fever.  Follow-up with your primary care physician next week.  If you worsen go immediately to the emergency room.  The coronavirus testing will be available in 12 to 24 hours and you will be notified through web chart

## 2019-06-07 NOTE — Patient Instructions (Signed)
Referred to [facility] for further evaluation

## 2019-06-07 NOTE — ED Provider Notes (Signed)
[facility]    CSN: [id number] Arrival date & time: 06/07/19  1829      History   Chief Complaint Chief Complaint  Patient presents with  . Chills  . Generalized Body Aches  . Cough  . Diarrhea    HPI Jason Malone is a 79 y.o. Malone.   HPI  79 year old Malone presents with chills body aches cough headache intermittent fever to 100.3 along with nausea and diarrhea.  States has been going on for about 2 weeks.  He had a Covid vaccine on 1/14 and a week afterwards developed his symptoms.  Has been taking Pepto-Bismol for the stomach upset that he is experiencing.  At this visit he is complaining of that being the prominent symptom.  He has noticed black stools that started yesterday but does not have a tarry characteristic to them.  His last bowel movement was yesterday which he classified as normal.  He has been having 1 bowel movement per day.  Denies any blood in his stool. At first his stools were loose but now is back to normal stools.  He has had no vomiting.  Denies any abdominal pain.  He has had a mildly productive cough.  O2 sats 96-97% on room air.  He is afebrile today.  Vitals signs show: pulse of 60 blood pressure 150/76.  Has a history of lung cancer with a left upper lobectomy performed in 2015.  He is under the care of Dr. Grayland Ormond.  He was referred to our clinic today for evaluation after he had a telemedicine visit  with his primary care provider.        Past Medical History:  Diagnosis Date  . Allergic rhinitis   . Benign prostatic hypertrophy with lower urinary tract symptoms (LUTS)   . Chest pain    a. 05/2013 MV: EF 70%, no ischemia.  . Diverticulitis    DIVERTICULOSIS  . Enlarged prostate   . Essential hypertension    CONTROLLED ON MEDS  . Full dentures    upper and lower  . H/O hypokalemia   . Hx of atrial fibrillation without current medication    CARDIOLOGIST-DR [facility]  . 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  08/28/2016  . Hyperlipemia   . Hypomagnesemia   . IFG (impaired fasting glucose)   . Liver cancer ([place])   . Liver cancer ([place]) 12/08/2016  . Liver mass, left lobe 08/28/2016   Probably hemangioma; will get MRI of liver, refer to GI  . Lung cancer ([place])    a. carcinoid, left lung, Stage 1b (T2a, N0, cM0);  b. 05/2013 s/p VATS & LULobectomy.  . Monocytosis   . Osteopenia   . Paroxysmal atrial flutter ([facility])    a. 03/2013->no recurrence;  b. CHA2DS2VASc = 2-->not currently on anticoagulation;  c. 05/2012 Echo: EF 55-60%, normal RV.  [name] Wears glasses     Patient Active Problem List   Diagnosis Date Noted  . Contracture of palmar fascia 10/11/2018  . Microalbuminuria due to type 2 diabetes mellitus ([place]) 04/12/2018  . Type 2 diabetes mellitus ([place]) 04/10/2018  . Osteopenia determined by x-ray 01/07/2017  . 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 Low back pain 12/17/2016  . Liver mass, left lobe 08/28/2016  . 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 History of lung cancer 06/29/2016  . Aortic atherosclerosis ([place]) 04/06/2016  . Coronary artery disease involving native heart without angina pectoris 04/06/2016  .  History of colonic polyps 10/28/2015  . Medication monitoring encounter 08/15/2015  . Paroxysmal atrial flutter ([place])   . Elevated serum alkaline phosphatase level 03/05/2015  . Allergic rhinitis   . BPH without urinary obstruction   . Hyperlipidemia 02/13/2014  . Sinus bradycardia 07/18/2013  . S/P lobectomy of lung 06/04/2013  . Atrial flutter ([place]) 04/09/2013  . Smoking history 04/09/2013  . Essential hypertension 04/09/2013  . Rectal bleeding 12/15/2012    Past Surgical History:  Procedure Laterality Date  . BRONCHOSCOPY     04/06/2013  . CARDIOVASCULAR STRESS TEST  05/2013   a. No evidence of ischemia or infarct, EF 70%, no WMAs  . CATARACT EXTRACTION W/PHACO Left 07/14/2015   Procedure: CATARACT EXTRACTION PHACO AND INTRAOCULAR  LENS PLACEMENT (IOC);  Surgeon: [name], MD;  Location: [place];  Service: Ophthalmology;  Laterality: Left;  . CATARACT EXTRACTION W/PHACO Right 10/01/2015   Procedure: CATARACT EXTRACTION PHACO AND INTRAOCULAR LENS PLACEMENT (IOC);  Surgeon: [name], MD;  Location: [place];  Service: Ophthalmology;  Laterality: Right;  . COLONOSCOPY W/ POLYPECTOMY     bleed after-had to go to surgery to stop bleeding via colonoscopy  . COLONOSCOPY WITH PROPOFOL N/A 11/19/2015   Procedure: COLONOSCOPY WITH PROPOFOL;  Surgeon: [name], MD;  Location[facility];  Service: Endoscopy;  Laterality: N/A;  . DUPUYTREN CONTRACTURE RELEASE  12/15/2011   Procedure: DUPUYTREN CONTRACTURE RELEASE;  Surgeon: [name], MD;  Location: [place];  Service: Orthopedics;  Laterality: Left;  Fasciotomy left ring finger dupuytrens  . FASCIECTOMY Right 11/07/2018   Procedure: SEGMENTAL FASCIECTOMY RIGHT RING FINGER;  Surgeon: Daryll Brod, MD;  Location: [place];  Service: Orthopedics;  Laterality: Right;  AXILLARY BLOCK  . IR ANGIOGRAM SELECTIVE EACH ADDITIONAL VESSEL  07/26/2017  . IR ANGIOGRAM SELECTIVE EACH ADDITIONAL VESSEL  07/26/2017  . IR ANGIOGRAM SELECTIVE EACH ADDITIONAL VESSEL  07/26/2017  . IR ANGIOGRAM SELECTIVE EACH ADDITIONAL VESSEL  07/26/2017  . IR ANGIOGRAM SELECTIVE EACH ADDITIONAL VESSEL  07/26/2017  . IR ANGIOGRAM SELECTIVE EACH ADDITIONAL VESSEL  08/11/2017  . IR ANGIOGRAM SELECTIVE EACH ADDITIONAL VESSEL  04/28/2018  . IR ANGIOGRAM VISCERAL SELECTIVE  07/26/2017  . IR ANGIOGRAM VISCERAL SELECTIVE  08/11/2017  . IR ANGIOGRAM VISCERAL SELECTIVE  04/28/2018  . IR EMBO ARTERIAL NOT HEMORR HEMANG INC GUIDE ROADMAPPING  07/26/2017  . IR EMBO TUMOR ORGAN ISCHEMIA INFARCT INC GUIDE ROADMAPPING  08/11/2017  . IR EMBO TUMOR ORGAN ISCHEMIA INFARCT INC GUIDE ROADMAPPING  04/28/2018  . IR RADIOLOGIST EVAL & MGMT  07/07/2017  . IR  RADIOLOGIST EVAL & MGMT  09/07/2017  . IR RADIOLOGIST EVAL & MGMT  12/21/2017  . IR RADIOLOGIST EVAL & MGMT  03/22/2018  . IR RADIOLOGIST EVAL & MGMT  06/08/2018  . IR RADIOLOGIST EVAL & MGMT  11/01/2018  . IR RADIOLOGIST EVAL & MGMT  03/21/2019  . IR US GUIDE VASC ACCESS RIGHT  07/26/2017  . IR US GUIDE VASC ACCESS RIGHT  08/11/2017  . IR US GUIDE VASC ACCESS RIGHT  04/28/2018  . LUNG LOBECTOMY  06/10/13   upper left lung  . MULTIPLE TOOTH EXTRACTIONS    . REMOVAL RETAINED LENS Right 11/17/2015   Procedure: REMOVAL RETAINED LENS FROAGMENTS RIGHT EYE;  Surgeon: [name], MD;  Location: [place];  Service: Ophthalmology;  Laterality: Right;  . TONSILLECTOMY    . UPPER GASTROINTESTINAL ENDOSCOPY  11-03-15   Dr [name]  . VASECTOMY    .  VIDEO ASSISTED THORACOSCOPY (VATS)/WEDGE RESECTION Left 06/04/2013   Procedure: VIDEO ASSISTED THORACOSCOPY (VATS)/WEDGE RESECTION;  Surgeon: [name], MD;  Location: [place];  Service: Thoracic;  Laterality: Left;  [name] VIDEO BRONCHOSCOPY N/A 06/04/2013   Procedure: VIDEO BRONCHOSCOPY;  Surgeon: [name], MD;  Location: [facility] OR;  Service: Thoracic;  Laterality: N/A;       Home Medications    Prior to Admission medications   Medication Sig Start Date End Date Taking? Authorizing Provider  amLODipine (NORVASC) 2.5 MG tablet Take 1 tablet by mouth once daily 01/01/19  Yes Gollan, Kathlene November, MD  atorvastatin (LIPITOR) 40 MG tablet TAKE 1 TABLET BY MOUTH ONCE DAILY FOR CHOLESTEROL 04/11/19  Yes Delsa Grana, PA-C  b complex vitamins capsule Take 1 capsule by mouth daily. AM   Yes [provider]  Calcium Carbonate-Vitamin D3 (CALCIUM 600/VITAMIN D) 600-400 MG-UNIT TABS Take by mouth daily.   Yes [provider]  Cinnamon 500 MG capsule Take 500 mg by mouth daily. AM AND BEDTIME   Yes [provider]  fexofenadine (ALLEGRA) 180 MG tablet Take 180 mg by mouth daily.   Yes [provider]  finasteride  (PROSCAR) 5 MG tablet Take 1 tablet (5 mg total) by mouth daily. 04/11/19  Yes Delsa Grana, PA-C  losartan (COZAAR) 100 MG tablet Take 1 tablet (100 mg total) by mouth daily. 04/11/19  Yes Delsa Grana, PA-C  Multiple Vitamins-Minerals (MULTIVITAMIN WITH MINERALS) tablet Take 1 tablet by mouth daily. AM   Yes [provider]  polycarbophil (FIBERCON) 625 MG tablet Take 625 mg by mouth daily. AM   Yes [provider]  polyethylene glycol (MIRALAX / GLYCOLAX) packet Take 17 g by mouth daily as needed.    Yes [provider]  tamsulosin (FLOMAX) 0.4 MG CAPS capsule Take 1 capsule (0.4 mg total) by mouth daily. 04/11/19  Yes Delsa Grana, PA-C  vitamin C (ASCORBIC ACID) 500 MG tablet Take 500 mg by mouth daily. AM   Yes [provider]  LANREOTIDE ACETATE [place] Inject 1 Dose into the skin every 30 (thirty) days.    [provider]    Family History Family History  Problem Relation Age of Onset  . Stroke Mother   . Alzheimer's disease Mother   . Hypertension Sister   . Hyperlipidemia Sister   . Hypertension Brother   . Hyperlipidemia Brother   . Diabetes Brother     Social History Social History   Tobacco Use  . Smoking status: Former Smoker    Packs/day: 1.00    Years: 40.00    Pack years: 40.00    Types: Cigarettes    Quit date: 12/09/1998    Years since quitting: 20.5  . Smokeless tobacco: Never Used  Substance Use Topics  . Alcohol use: No  . Drug use: No     Allergies   Latex and Tape   Review of Systems Review of Systems  Constitutional: Positive for activity change, appetite change, fatigue and fever. Negative for chills and diaphoresis.  Respiratory: Positive for cough. Negative for wheezing and stridor.   Gastrointestinal: Positive for diarrhea and nausea. Negative for abdominal distention, abdominal pain, blood in stool, constipation, rectal pain and vomiting.  All other systems reviewed and are negative.    Physical  Exam Triage Vital Signs ED Triage Vitals  Enc Vitals Group     BP 06/07/19 1017 113/64     Pulse Rate 06/07/19 1017 68     Resp  06/07/19 1017 18     Temp 06/07/19 1017 98.1 F (36.7 C)     Temp Source 06/07/19 1017 Oral     SpO2 06/07/19 1017 96 %     Weight 06/07/19 1015 145 lb (65.8 kg)     Height 06/07/19 1015 5\' 4"  (1.626 m)     Head Circumference --      Peak Flow --      Pain Score 06/07/19 1013 0     Pain Loc --      Pain Edu? --      Excl. in [place]? --    No data found.  Updated Vital Signs BP 113/64 (BP Location: Right Arm)   Pulse 68   Temp 98.1 F (36.7 C) (Oral)   Resp 18   Ht 5\' 4"  (1.626 m)   Wt 145 lb (65.8 kg)   SpO2 96%   BMI 24.89 kg/m   Visual Acuity Right Eye Distance:   Left Eye Distance:   Bilateral Distance:    Right Eye Near:   Left Eye Near:    Bilateral Near:     Physical Exam Vitals and nursing note reviewed.  Constitutional:      General: He is not in acute distress.    Appearance: Normal appearance. He is normal weight. He is not ill-appearing, toxic-appearing or diaphoretic.  HENT:     Head: Normocephalic and atraumatic.     Nose: Nose normal.     Mouth/Throat:     Mouth: Mucous membranes are moist.     Pharynx: Oropharynx is clear.  Eyes:     Conjunctiva/sclera: Conjunctivae normal.  Cardiovascular:     Rate and Rhythm: Normal rate and regular rhythm.     Pulses: Normal pulses.     Heart sounds: Normal heart sounds.  Pulmonary:     Effort: Pulmonary effort is normal. No respiratory distress.     Breath sounds: Normal breath sounds. No stridor. No wheezing, rhonchi or rales.  Abdominal:     General: Abdomen is flat. There is no distension.     Palpations: Abdomen is soft.     Tenderness: There is no abdominal tenderness. There is no right CVA tenderness, left CVA tenderness, guarding or rebound.  Genitourinary:    Rectum: Normal. Guaiac result negative.     Comments: Hee had very minimal stool in the rectal vault.   Prostate was enlarged but nontender with no palpable masses present. Musculoskeletal:        General: Normal range of motion.     Cervical back: Normal range of motion and neck supple.  Skin:    General: Skin is warm and dry.  Neurological:     General: No focal deficit present.     Mental Status: He is alert and oriented to person, place, and time.  Psychiatric:        Mood and Affect: Mood normal.        Behavior: Behavior normal.        Thought Content: Thought content normal.        Judgment: Judgment normal.      UC Treatments / Results  Labs (all labs ordered are listed, but only abnormal results are displayed) Labs Reviewed  CBC WITH DIFFERENTIAL/PLATELET - Abnormal; Notable for the following components:      Result Value   Platelets 143 (*)    Lymphs Abs 0.5 (*)    Monocytes Absolute 1.3 (*)    All other components within normal limits  BASIC METABOLIC PANEL - Abnormal; Notable for the following components:   Sodium 134 (*)    Potassium 3.4 (*)    Glucose, Bld 119 (*)    Calcium 8.5 (*)    All other components within normal limits  NOVEL CORONAVIRUS, NAA (HOSP ORDER, SEND-OUT TO REF LAB; TAT 18-24 HRS)  OCCULT BLOOD X 1 CARD TO LAB, STOOL    EKG   Radiology DG Chest 2 View  Result Date: 06/07/2019 CLINICAL DATA:  Cough EXAM: CHEST - 2 VIEW COMPARISON:  2019 FINDINGS: The heart size and mediastinal contours are within normal limits. Postoperative changes at the left hilum. No new consolidation or edema. Right mid lung calcified granuloma. Degenerative changes of the spine. IMPRESSION: No acute process in the chest Electronically Signed   By: Macy Mis M.D.   On: 06/07/2019 11:31    Procedures Procedures (including critical care time)  Medications Ordered in UC Medications - No data to display  Initial Impression / Assessment and Plan / UC Course  I have reviewed the triage vital signs and the nursing notes.  Pertinent labs & imaging results that  were available during my care of the patient were reviewed by me and considered in my medical decision making (see chart for details).   Jason Malone presents with history of chills, body aches, cough, headache, intermittent fever up to 100.3, nausea and mild diarrhea.  Received a Covid vaccine on 05/24/2019 and about 1 week afterwards developed the above symptoms.  He was evaluated today by his primary care provider in a video conference and he mentioned dark stools which alarmed the provider and she recommended he be worked up here.  He has a previous history of lung cancer status post left upper lobectomy in 2015.  He is under the care of Dr. Grayland Ormond.  He does not take anticoagulants.  He does not report any blood in the stool.  The stool does not have a tarry consistency.  His major complaint today is that of abdominal nausea without vomiting.  He states he just does not feel well.  Examination today did not show any significant red flag findings.  Rectal exam was performed with a very minimal amount of stool in the rectal vault and did not show any occult blood.  Laboratory showed normal H&H as well as WBCs.  He did have very mild hypokalemia and hyponatremia.  Chest x-ray did not show any active processes.  His symptoms seem to have occurred long after his vaccine to be associated with it but it is possible.  Is more likely has a viral upper respiratory infection.  The dark stools are likely from bismuth from Pepto-Bismol.  COVID-19 testing by PCR was obtained today and will be available typically in 12 to 24 hours.  In the meantime I have told him to continue with fluids get adequate rest take Tylenol or Motrin as necessary for body aches or fever.  No medications are indicated at this time.  He was encouraged to follow-up with his primary care next week or if he worsens go to the emergency room immediately.  Was fully explained to the patient.       Final Clinical Impressions(s) / UC Diagnoses    Final diagnoses:  Cough  Upper respiratory tract infection, unspecified type  Nausea without vomiting  Dark stools     Discharge Instructions     Get plenty of rest.  Drink adequate fluids.  Use Tylenol or Motrin for body aches or  fever.  Follow-up with your primary care physician next week.  If you worsen go immediately to the emergency room.  The coronavirus testing will be available in 12 to 24 hours and you will be notified through web chart    ED Prescriptions    None     PDMP not reviewed this encounter.   Lorin Picket, PA-C 06/07/19 2026

## 2019-06-08 LAB — NOVEL CORONAVIRUS, NAA (HOSP ORDER, SEND-OUT TO REF LAB; TAT 18-24 HRS): SARS-CoV-2, NAA: DETECTED — AB

## 2019-06-08 NOTE — Progress Notes (Deleted)
Called patient no answer left message  

## 2019-06-08 NOTE — Progress Notes (Deleted)
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  INTERVAL HISTORY: Patient returns to clinic today for laboratory work, further evaluation, discussion of his imaging results, and continuation of lanreotide.  He continues to feel well and remains asymptomatic.  He has no neurologic complaints.  He denies any weakness or fatigue.  He has a good appetite and denies weight loss.  He denies any chest pain, shortness of breath, cough, or hemoptysis.  He denies any abdominal pain.  He has no nausea, vomiting, constipation, or diarrhea.  He has no urinary complaints.  Patient feels at his baseline offers no specific complaints today.  REVIEW OF SYSTEMS:   Review of Systems  Constitutional: Negative.  Negative for fever, malaise/fatigue and weight loss.  Respiratory: Negative.  Negative for cough, hemoptysis and shortness of breath.   Cardiovascular: Negative.  Negative for chest pain and leg swelling.  Gastrointestinal: Negative.  Negative for abdominal pain, diarrhea, nausea and vomiting.  Genitourinary: Negative.  Negative for dysuria.  Musculoskeletal: Negative.  Negative for back pain.  Skin: Negative.  Negative for rash.  Neurological: Negative.  Negative for dizziness, sensory change, focal weakness and weakness.  Psychiatric/Behavioral: Negative.  The patient is not nervous/anxious.      As per HPI. Otherwise, a complete review of systems is negative.  PAST MEDICAL HISTORY: Past Medical History:  Diagnosis Date  . Allergic rhinitis   . Benign prostatic hypertrophy with lower urinary tract symptoms (LUTS)   . Chest pain    a. 05/2013 MV: EF 70%, no ischemia.  . Diverticulitis    DIVERTICULOSIS  . Enlarged prostate   . Essential hypertension    CONTROLLED ON MEDS  . Full dentures    upper and lower  . H/O hypokalemia   . Hx of atrial fibrillation without current medication    CARDIOLOGIST-DR [facility]  . 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 Hyperlipemia   . Hypomagnesemia   . IFG (impaired fasting glucose)   . Liver cancer ([place])   . Liver cancer ([place]) 12/08/2016  . Liver mass, left lobe 08/28/2016   Probably hemangioma; will get MRI of liver, refer to GI  . Lung cancer ([place])    a. carcinoid, left lung, Stage 1b (T2a, N0, cM0);  b. 05/2013 s/p VATS & LULobectomy.  . Monocytosis   . Osteopenia   . Paroxysmal atrial flutter ([facility])    a. 03/2013->no recurrence;  b. CHA2DS2VASc = 2-->not currently on anticoagulation;  c. 05/2012 Echo: EF 55-60%, normal RV.  [name] Wears glasses     PAST SURGICAL HISTORY: Past Surgical History:  Procedure Laterality Date  . BRONCHOSCOPY     04/06/2013  . CARDIOVASCULAR STRESS TEST  05/2013   a. No evidence of ischemia or infarct, EF 70%, no WMAs  . CATARACT EXTRACTION W/PHACO Left 07/14/2015   Procedure: CATARACT EXTRACTION PHACO AND INTRAOCULAR LENS PLACEMENT (IOC);  Surgeon: [name], MD;  Location: [place];  Service: Ophthalmology;  Laterality:  Left;  . CATARACT EXTRACTION W/PHACO Right 10/01/2015   Procedure: CATARACT EXTRACTION PHACO AND INTRAOCULAR LENS PLACEMENT (IOC);  Surgeon: [name], MD;  Location: [place];  Service: Ophthalmology;  Laterality: Right;  . COLONOSCOPY W/ POLYPECTOMY     bleed after-had to go to surgery to stop bleeding via  colonoscopy  . COLONOSCOPY WITH PROPOFOL N/A 11/19/2015   Procedure: COLONOSCOPY WITH PROPOFOL;  Surgeon: [name], MD;  Location[facility] ENDOSCOPY;  Service: Endoscopy;  Laterality: N/A;  . DUPUYTREN CONTRACTURE RELEASE  12/15/2011   Procedure: DUPUYTREN CONTRACTURE RELEASE;  Surgeon: [name], MD;  Location: [place];  Service: Orthopedics;  Laterality: Left;  Fasciotomy left ring finger dupuytrens  . FASCIECTOMY Right 11/07/2018   Procedure: SEGMENTAL FASCIECTOMY RIGHT RING FINGER;  Surgeon: Daryll Brod, MD;  Location: [place];  Service: Orthopedics;  Laterality: Right;  AXILLARY BLOCK  . IR ANGIOGRAM SELECTIVE EACH ADDITIONAL VESSEL  07/26/2017  . IR ANGIOGRAM SELECTIVE EACH ADDITIONAL VESSEL  07/26/2017  . IR ANGIOGRAM SELECTIVE EACH ADDITIONAL VESSEL  07/26/2017  . IR ANGIOGRAM SELECTIVE EACH ADDITIONAL VESSEL  07/26/2017  . IR ANGIOGRAM SELECTIVE EACH ADDITIONAL VESSEL  07/26/2017  . IR ANGIOGRAM SELECTIVE EACH ADDITIONAL VESSEL  08/11/2017  . IR ANGIOGRAM SELECTIVE EACH ADDITIONAL VESSEL  04/28/2018  . IR ANGIOGRAM VISCERAL SELECTIVE  07/26/2017  . IR ANGIOGRAM VISCERAL SELECTIVE  08/11/2017  . IR ANGIOGRAM VISCERAL SELECTIVE  04/28/2018  . IR EMBO ARTERIAL NOT HEMORR HEMANG INC GUIDE ROADMAPPING  07/26/2017  . IR EMBO TUMOR ORGAN ISCHEMIA INFARCT INC GUIDE ROADMAPPING  08/11/2017  . IR EMBO TUMOR ORGAN ISCHEMIA INFARCT INC GUIDE ROADMAPPING  04/28/2018  . IR RADIOLOGIST EVAL & MGMT  07/07/2017  . IR RADIOLOGIST EVAL & MGMT  09/07/2017  . IR RADIOLOGIST EVAL & MGMT  12/21/2017  . IR RADIOLOGIST EVAL & MGMT  03/22/2018  . IR RADIOLOGIST EVAL & MGMT  06/08/2018  . IR RADIOLOGIST EVAL & MGMT  11/01/2018  . IR RADIOLOGIST EVAL & MGMT  03/21/2019  . IR US GUIDE VASC ACCESS RIGHT  07/26/2017  . IR US GUIDE VASC ACCESS RIGHT  08/11/2017  . IR US GUIDE VASC ACCESS RIGHT  04/28/2018  . LUNG LOBECTOMY  06/10/13   upper left lung  . MULTIPLE TOOTH EXTRACTIONS    .  REMOVAL RETAINED LENS Right 11/17/2015   Procedure: REMOVAL RETAINED LENS FROAGMENTS RIGHT EYE;  Surgeon: [name], MD;  Location: [place];  Service: Ophthalmology;  Laterality: Right;  . TONSILLECTOMY    . UPPER GASTROINTESTINAL ENDOSCOPY  11-03-15   Dr [name]  . VASECTOMY    . VIDEO ASSISTED THORACOSCOPY (VATS)/WEDGE RESECTION Left 06/04/2013   Procedure: VIDEO ASSISTED THORACOSCOPY (VATS)/WEDGE RESECTION;  Surgeon: [name], MD;  Location: [place];  Service: Thoracic;  Laterality: Left;  [name] VIDEO BRONCHOSCOPY N/A 06/04/2013   Procedure: VIDEO BRONCHOSCOPY;  Surgeon: [name], MD;  Location: [facility] OR;  Service: Thoracic;  Laterality: N/A;    FAMILY HISTORY: Family History  Problem Relation Age of Onset  . Stroke Mother   . Alzheimer's disease Mother   . Hypertension Sister   . Hyperlipidemia Sister   . Hypertension Brother   . Hyperlipidemia Brother   . Diabetes Brother     ADVANCED DIRECTIVES (Y/N):  N  HEALTH MAINTENANCE: Social History   Tobacco Use  . Smoking status: Former Smoker    Packs/day: 1.00    Years: 40.00    Pack years:  40.00    Types: Cigarettes    Quit date: 12/09/1998    Years since quitting: 20.5  . Smokeless tobacco: Never Used  Substance Use Topics  . Alcohol use: No  . Drug use: No     Colonoscopy:  PAP:  Bone density:  Lipid panel:  Allergies  Allergen Reactions  . Latex Itching    Tape only- REACTED TO BANDAGE ON ABDOMEN  . Tape Itching    Surgical tapes     Current Outpatient Medications  Medication Sig Dispense Refill  . amLODipine (NORVASC) 2.5 MG tablet Take 1 tablet by mouth once daily 90 tablet 3  . atorvastatin (LIPITOR) 40 MG tablet TAKE 1 TABLET BY MOUTH ONCE DAILY FOR CHOLESTEROL 90 tablet 1  . b complex vitamins capsule Take 1 capsule by mouth daily. AM    . Calcium Carbonate-Vitamin D3 (CALCIUM 600/VITAMIN D) 600-400 MG-UNIT TABS Take by mouth daily.    . Cinnamon 500 MG capsule Take 500  mg by mouth daily. AM AND BEDTIME    . fexofenadine (ALLEGRA) 180 MG tablet Take 180 mg by mouth daily.    . finasteride (PROSCAR) 5 MG tablet Take 1 tablet (5 mg total) by mouth daily. 90 tablet 1  . LANREOTIDE ACETATE [place] Inject 1 Dose into the skin every 30 (thirty) days.    [name] losartan (COZAAR) 100 MG tablet Take 1 tablet (100 mg total) by mouth daily. 90 tablet 1  . Multiple Vitamins-Minerals (MULTIVITAMIN WITH MINERALS) tablet Take 1 tablet by mouth daily. AM    . polycarbophil (FIBERCON) 625 MG tablet Take 625 mg by mouth daily. AM    . polyethylene glycol (MIRALAX / GLYCOLAX) packet Take 17 g by mouth daily as needed.     . tamsulosin (FLOMAX) 0.4 MG CAPS capsule Take 1 capsule (0.4 mg total) by mouth daily. 90 capsule 1  . vitamin C (ASCORBIC ACID) 500 MG tablet Take 500 mg by mouth daily. AM     No current facility-administered medications for this visit.   Facility-Administered Medications Ordered in Other Visits  Medication Dose Route Frequency Provider Last Rate Last Admin  . lanreotide acetate (SOMATULINE DEPOT) injection 120 mg  120 mg Subcutaneous Once Lloyd Huger, MD        OBJECTIVE: There were no vitals filed for this visit.   There is no height or weight on file to calculate BMI.    ECOG FS:0 - Asymptomatic  General: Well-developed, well-nourished, no acute distress. Eyes: Pink conjunctiva, anicteric sclera. HEENT: Normocephalic, moist mucous membranes. Lungs: Clear to auscultation bilaterally. Heart: Regular rate and rhythm. No rubs, murmurs, or gallops. Abdomen: Soft, nontender, nondistended. No organomegaly noted, normoactive bowel sounds. Musculoskeletal: No edema, cyanosis, or clubbing. Neuro: Alert, answering all questions appropriately. Cranial nerves grossly intact. Skin: No rashes or petechiae noted. Psych: Normal affect.  LAB RESULTS:  Lab Results  Component Value Date   NA 134 (L) 06/07/2019   K 3.4 (L) 06/07/2019   CL 99 06/07/2019   CO2 25  06/07/2019   GLUCOSE 119 (H) 06/07/2019   BUN 18 06/07/2019   CREATININE 0.89 06/07/2019   CALCIUM 8.5 (L) 06/07/2019   PROT 7.2 05/14/2019   ALBUMIN 4.3 05/14/2019   AST 31 05/14/2019   ALT 36 05/14/2019   ALKPHOS 140 (H) 05/14/2019   BILITOT 0.8 05/14/2019   GFRNONAA >60 06/07/2019   GFRAA >60 06/07/2019    Lab Results  Component Value Date   WBC 7.4 06/07/2019   NEUTROABS 5.5  06/07/2019   HGB 14.6 06/07/2019   HCT 43.4 06/07/2019   MCV 88.6 06/07/2019   PLT 143 (L) 06/07/2019     STUDIES: DG Chest 2 View  Result Date: 06/07/2019 CLINICAL DATA:  Cough EXAM: CHEST - 2 VIEW COMPARISON:  2019 FINDINGS: The heart size and mediastinal contours are within normal limits. Postoperative changes at the left hilum. No new consolidation or edema. Right mid lung calcified granuloma. Degenerative changes of the spine. IMPRESSION: No acute process in the chest Electronically Signed   By: Macy Mis M.D.   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  PLAN:    1. 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 Patient underwent left upper lobe lobectomy in [place] on June 04, 2013. Final pathology results revealed a well differentiated neuroendocrine tumor with positive bronchial margins. Patient did not receive adjuvant XRT or chemotherapy at that time.  Patient's most recent MRI on March 15, 2019 reviewed independently and reported as above with stable to decrease in size of known lesions in patient's liver.  His most recent Y 90 ablation occurred on April 28, 2018.  Patient's chromogranin a levels are elevated, but relatively stable ranging from 213-261.  Proceed with 120 mg subcutaneous lanreotide today.  Return to clinic in 4 and 8 weeks for lab and lanreotide only.  Patient then return to clinic in 12 weeks for further  evaluation and continuation of treatment.    I spent a total of 30 minutes face-to-face with the patient of which greater than 50% of the visit was spent in counseling and coordination of care as detailed above.   Patient expressed understanding and was in agreement with this plan. He also understands that He can call clinic at any time with any questions, concerns, or complaints.   Cancer Staging Neuroendocrine carcinoma of lung [facility]) Staging form: Lung, AJCC 8th Edition - Clinical stage from 12/28/2016: Stage IVA (cT2a, cN0, cM1b) - Signed by Lloyd Huger, MD on 12/28/2016   Lloyd Huger, MD   06/08/2019 6:32 AM

## 2019-06-09 ENCOUNTER — Telehealth (HOSPITAL_COMMUNITY): Payer: Self-pay | Admitting: Emergency Medicine

## 2019-06-09 NOTE — Telephone Encounter (Signed)

## 2019-06-10 ENCOUNTER — Emergency Department: Payer: Medicare Other

## 2019-06-10 ENCOUNTER — Other Ambulatory Visit: Payer: Self-pay

## 2019-06-10 ENCOUNTER — Inpatient Hospital Stay
Admission: EM | Admit: 2019-06-10 | Discharge: 2019-06-14 | DRG: 177 | Disposition: A | Discharge status: Other Acute Inpatient Hospital | Payer: Medicare Other | Attending: Hospitalist | Admitting: Hospitalist

## 2019-06-10 DIAGNOSIS — Z823 Family history of stroke: Secondary | ICD-10-CM

## 2019-06-10 DIAGNOSIS — C787 Secondary malignant neoplasm of liver and intrahepatic bile duct: Secondary | ICD-10-CM | POA: Diagnosis present

## 2019-06-10 DIAGNOSIS — R7989 Other specified abnormal findings of blood chemistry: Secondary | ICD-10-CM | POA: Diagnosis not present

## 2019-06-10 DIAGNOSIS — Z87891 Personal history of nicotine dependence: Secondary | ICD-10-CM | POA: Diagnosis not present

## 2019-06-10 DIAGNOSIS — J309 Allergic rhinitis, unspecified: Secondary | ICD-10-CM | POA: Diagnosis present

## 2019-06-10 DIAGNOSIS — Z85110 Personal history of malignant carcinoid tumor of bronchus and lung: Secondary | ICD-10-CM | POA: Diagnosis not present

## 2019-06-10 DIAGNOSIS — Z902 Acquired absence of lung [part of]: Secondary | ICD-10-CM | POA: Diagnosis not present

## 2019-06-10 DIAGNOSIS — J9690 Respiratory failure, unspecified, unspecified whether with hypoxia or hypercapnia: Secondary | ICD-10-CM | POA: Diagnosis present

## 2019-06-10 DIAGNOSIS — E119 Type 2 diabetes mellitus without complications: Secondary | ICD-10-CM

## 2019-06-10 DIAGNOSIS — M8580 Other specified disorders of bone density and structure, unspecified site: Secondary | ICD-10-CM | POA: Diagnosis present

## 2019-06-10 DIAGNOSIS — E7800 Pure hypercholesterolemia, unspecified: Secondary | ICD-10-CM | POA: Diagnosis not present

## 2019-06-10 DIAGNOSIS — Z833 Family history of diabetes mellitus: Secondary | ICD-10-CM | POA: Diagnosis not present

## 2019-06-10 DIAGNOSIS — Z8249 Family history of ischemic heart disease and other diseases of the circulatory system: Secondary | ICD-10-CM

## 2019-06-10 DIAGNOSIS — U071 COVID-19: Secondary | ICD-10-CM | POA: Diagnosis present

## 2019-06-10 DIAGNOSIS — N400 Enlarged prostate without lower urinary tract symptoms: Secondary | ICD-10-CM | POA: Diagnosis present

## 2019-06-10 DIAGNOSIS — E785 Hyperlipidemia, unspecified: Secondary | ICD-10-CM | POA: Diagnosis present

## 2019-06-10 DIAGNOSIS — J1282 Pneumonia due to coronavirus disease 2019: Secondary | ICD-10-CM

## 2019-06-10 DIAGNOSIS — Z8349 Family history of other endocrine, nutritional and metabolic diseases: Secondary | ICD-10-CM

## 2019-06-10 DIAGNOSIS — J9601 Acute respiratory failure with hypoxia: Secondary | ICD-10-CM

## 2019-06-10 DIAGNOSIS — Z820 Family history of epilepsy and other diseases of the nervous system: Secondary | ICD-10-CM

## 2019-06-10 DIAGNOSIS — Z85118 Personal history of other malignant neoplasm of bronchus and lung: Secondary | ICD-10-CM

## 2019-06-10 DIAGNOSIS — I10 Essential (primary) hypertension: Secondary | ICD-10-CM | POA: Diagnosis not present

## 2019-06-10 DIAGNOSIS — I480 Paroxysmal atrial fibrillation: Secondary | ICD-10-CM | POA: Diagnosis present

## 2019-06-10 DIAGNOSIS — R160 Hepatomegaly, not elsewhere classified: Secondary | ICD-10-CM | POA: Diagnosis present

## 2019-06-10 DIAGNOSIS — E118 Type 2 diabetes mellitus with unspecified complications: Secondary | ICD-10-CM | POA: Diagnosis not present

## 2019-06-10 DIAGNOSIS — I2510 Atherosclerotic heart disease of native coronary artery without angina pectoris: Secondary | ICD-10-CM | POA: Diagnosis not present

## 2019-06-10 DIAGNOSIS — I4892 Unspecified atrial flutter: Secondary | ICD-10-CM | POA: Diagnosis not present

## 2019-06-10 DIAGNOSIS — C7A8 Other malignant neuroendocrine tumors: Secondary | ICD-10-CM | POA: Diagnosis not present

## 2019-06-10 LAB — COMPREHENSIVE METABOLIC PANEL
ALT: 69 U/L — ABNORMAL HIGH (ref 0–44)
AST: 74 U/L — ABNORMAL HIGH (ref 15–41)
Albumin: 3.6 g/dL (ref 3.5–5.0)
Alkaline Phosphatase: 247 U/L — ABNORMAL HIGH (ref 38–126)
Anion gap: 12 (ref 5–15)
BUN: 17 mg/dL (ref 8–23)
CO2: 26 mmol/L (ref 22–32)
Calcium: 9 mg/dL (ref 8.9–10.3)
Chloride: 101 mmol/L (ref 98–111)
Creatinine, Ser: 0.92 mg/dL (ref 0.61–1.24)
GFR calc Af Amer: >60 mL/min (ref >60)
GFR calc non Af Amer: >60 mL/min (ref >60)
Glucose, Bld: 138 mg/dL — ABNORMAL HIGH (ref 70–99)
Potassium: 3.6 mmol/L (ref 3.5–5.1)
Sodium: 139 mmol/L (ref 135–145)
Total Bilirubin: 0.6 mg/dL (ref 0.3–1.2)
Total Protein: 7.1 g/dL (ref 6.5–8.1)

## 2019-06-10 LAB — CBC WITH DIFFERENTIAL/PLATELET
Abs Immature Granulocytes: 0.05 10*3/uL (ref 0.00–0.07)
Basophils Absolute: 0 10*3/uL (ref 0.0–0.1)
Basophils Relative: 0 %
Eosinophils Absolute: 0 10*3/uL (ref 0.0–0.5)
Eosinophils Relative: 0 %
HCT: 47.3 % (ref 39.0–52.0)
Hemoglobin: 16.4 g/dL (ref 13.0–17.0)
Immature Granulocytes: 1 %
Lymphocytes Relative: 8 %
Lymphs Abs: 0.6 10*3/uL — ABNORMAL LOW (ref 0.7–4.0)
MCH: 31 pg (ref 26.0–34.0)
MCHC: 34.7 g/dL (ref 30.0–36.0)
MCV: 89.4 fL (ref 80.0–100.0)
Monocytes Absolute: 1.3 10*3/uL — ABNORMAL HIGH (ref 0.1–1.0)
Monocytes Relative: 17 %
Neutro Abs: 5.5 10*3/uL (ref 1.7–7.7)
Neutrophils Relative %: 74 %
Platelets: 185 10*3/uL (ref 150–400)
RBC: 5.29 MIL/uL (ref 4.22–5.81)
RDW: 14.6 % (ref 11.5–15.5)
WBC: 7.3 10*3/uL (ref 4.0–10.5)
nRBC: 0 % (ref 0.0–0.2)

## 2019-06-10 LAB — LACTIC ACID, PLASMA: Lactic Acid, Venous: 1.6 mmol/L (ref 0.5–1.9)

## 2019-06-10 LAB — FIBRIN DERIVATIVES D-DIMER (ARMC ONLY): Fibrin derivatives D-dimer (ARMC): 1330.76 ng/mL (FEU) — ABNORMAL HIGH (ref 0.00–499.00)

## 2019-06-10 LAB — FERRITIN: Ferritin: 1342 ng/mL — ABNORMAL HIGH (ref 24–336)

## 2019-06-10 LAB — LACTATE DEHYDROGENASE: LDH: 385 U/L — ABNORMAL HIGH (ref 98–192)

## 2019-06-10 LAB — FIBRINOGEN: Fibrinogen: 717 mg/dL — ABNORMAL HIGH (ref 210–475)

## 2019-06-10 LAB — TRIGLYCERIDES: Triglycerides: 139 mg/dL (ref <150)

## 2019-06-10 LAB — PROCALCITONIN: Procalcitonin: 0.11 ng/mL

## 2019-06-10 MED ORDER — SODIUM CHLORIDE 0.9 % IV SOLN
200.0000 mg | Freq: Once | INTRAVENOUS | Status: AC
  Administered 2019-06-11: 03:00:00 200 mg via INTRAVENOUS
  Filled 2019-06-10: qty 200

## 2019-06-10 MED ORDER — GUAIFENESIN-DM 100-10 MG/5ML PO SYRP
10.0000 mL | ORAL_SOLUTION | ORAL | Status: DC | PRN
  Administered 2019-06-13: 10 mL via ORAL
  Filled 2019-06-10 (×2): qty 10

## 2019-06-10 MED ORDER — ENOXAPARIN SODIUM 40 MG/0.4ML ~~LOC~~ SOLN
40.0000 mg | SUBCUTANEOUS | Status: DC
  Administered 2019-06-11 – 2019-06-13 (×4): 40 mg via SUBCUTANEOUS
  Filled 2019-06-10 (×4): qty 0.4

## 2019-06-10 MED ORDER — DEXAMETHASONE SODIUM PHOSPHATE 10 MG/ML IJ SOLN
6.0000 mg | Freq: Once | INTRAMUSCULAR | Status: AC
  Administered 2019-06-11: 6 mg via INTRAVENOUS
  Filled 2019-06-10: qty 1

## 2019-06-10 MED ORDER — ONDANSETRON HCL 4 MG/2ML IJ SOLN
4.0000 mg | Freq: Four times a day (QID) | INTRAMUSCULAR | Status: DC | PRN
  Administered 2019-06-11: 4 mg via INTRAVENOUS
  Filled 2019-06-10: qty 2

## 2019-06-10 MED ORDER — DEXAMETHASONE 4 MG PO TABS: 6.0000 mg | ORAL_TABLET | Freq: Every day | ORAL | Status: DC

## 2019-06-10 MED ORDER — SODIUM CHLORIDE 0.9 % IV SOLN
100.0000 mg | Freq: Every day | INTRAVENOUS | Status: DC
  Administered 2019-06-11 – 2019-06-13 (×3): 100 mg via INTRAVENOUS
  Filled 2019-06-10 (×4): qty 20

## 2019-06-10 MED ORDER — ACETAMINOPHEN 325 MG PO TABS: 650.0000 mg | ORAL_TABLET | Freq: Four times a day (QID) | ORAL | Status: DC | PRN

## 2019-06-10 MED ORDER — ONDANSETRON HCL 4 MG PO TABS: 4.0000 mg | ORAL_TABLET | Freq: Four times a day (QID) | ORAL | Status: DC | PRN

## 2019-06-10 NOTE — H&P (Signed)
History and Physical    Jason Malone DOB: [birth year] DOA: 06/10/2019  Referring MD/NP/PA:   PCP: Delsa Grana, PA-C   Patient coming from:  The patient is coming from home.  At baseline, pt is independent for most of ADL.        Chief Complaint: cough for 10 days, SOB for a few days  HPI: Jason Malone is a 79 y.o. male with medical history significant of HTN, HLD, DM2, CAD, h/o bronchus and lung carcinoid tumor, liver cancer in remission, PAF not on AC, BPH, COVID-19 positive on 1/28, presented to ED with cough for 10 days, SOB for a few days. He reports dry cough, subjective fever, chills, headache, fatigue, poor oral intake, stomach upset. His O2 sat down to 84% at home, prompt ED visit. He denies chest pain, palpitation, abdominal pain, diarrhea, dysuria, leg swelling or calf pain. He got his first Covid vaccination on January 14.   ED Course: pt was afebrile, O2 sat > 96% on 4L.  Labs no leukocytosis.  CXR bilateral opacity.  D-dimer and CRP elevated.  Patient was given dexamethasone and admitted with COVID-19 pneumonia with hypoxia.  Review of Systems:   General: subjective fevers, chills, has poor appetite, has fatigue HEENT: no blurry vision, hearing changes or sore throat Respiratory: + dyspnea, coughing, no wheezing CV: no chest pain, no palpitations GI: + nausea, no vomiting, abdominal pain, diarrhea, constipation GU: no dysuria, burning on urination, increased urinary frequency, hematuria  Ext: no leg edema Neuro: no unilateral weakness, numbness, or tingling, no vision change or hearing loss Skin: no rash, no skin tear. MSK: No muscle spasm, no deformity, no limitation of range of movement in spin Heme: No easy bruising.  Travel history: No recent long distant travel.  Allergy:  Allergies  Allergen Reactions  . Latex Itching    Tape only- REACTED TO BANDAGE ON ABDOMEN  . Tape Itching    Surgical tapes     Past Medical History:  Diagnosis Date  .  Allergic rhinitis   . Benign prostatic hypertrophy with lower urinary tract symptoms (LUTS)   . Chest pain    a. 05/2013 MV: EF 70%, no ischemia.  . Diverticulitis    DIVERTICULOSIS  . Enlarged prostate   . Essential hypertension    CONTROLLED ON MEDS  . Full dentures    upper and lower  . H/O hypokalemia   . Hx of atrial fibrillation without current medication    CARDIOLOGIST-DR [facility]  . 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 Hyperlipemia   . Hypomagnesemia   . IFG (impaired fasting glucose)   . Liver cancer ([place])   . Liver cancer ([place]) 12/08/2016  . Liver mass, left lobe 08/28/2016   Probably hemangioma; will get MRI of liver, refer to GI  . Lung cancer ([place])    a. carcinoid, left lung, Stage 1b (T2a, N0, cM0);  b. 05/2013 s/p VATS & LULobectomy.  . Monocytosis   . Osteopenia   . Paroxysmal atrial flutter (HCC)    a. 03/2013->no recurrence;  b. CHA2DS2VASc = 2-->not currently on anticoagulation;  c. 05/2012 Echo: EF 55-60%, normal RV.  [name] Wears glasses     Past Surgical History:  Procedure Laterality Date  . BRONCHOSCOPY     04/06/2013  . CARDIOVASCULAR STRESS TEST  05/2013   a. No evidence of ischemia or infarct, EF 70%, no WMAs  . CATARACT EXTRACTION W/PHACO Left 07/14/2015   Procedure:  CATARACT EXTRACTION PHACO AND INTRAOCULAR LENS PLACEMENT (IOC);  Surgeon: [name], MD;  Location: [place];  Service: Ophthalmology;  Laterality: Left;  . CATARACT EXTRACTION W/PHACO Right 10/01/2015   Procedure: CATARACT EXTRACTION PHACO AND INTRAOCULAR LENS PLACEMENT (IOC);  Surgeon: [name], MD;  Location: [place];  Service: Ophthalmology;  Laterality: Right;  . COLONOSCOPY W/ POLYPECTOMY     bleed after-had to go to surgery to stop bleeding via colonoscopy  . COLONOSCOPY WITH PROPOFOL N/A 11/19/2015   Procedure: COLONOSCOPY WITH PROPOFOL;  Surgeon: [name], MD;  Location: [facility] ENDOSCOPY;  Service:  Endoscopy;  Laterality: N/A;  . DUPUYTREN CONTRACTURE RELEASE  12/15/2011   Procedure: DUPUYTREN CONTRACTURE RELEASE;  Surgeon: [name], MD;  Location: [place];  Service: Orthopedics;  Laterality: Left;  Fasciotomy left ring finger dupuytrens  . FASCIECTOMY Right 11/07/2018   Procedure: SEGMENTAL FASCIECTOMY RIGHT RING FINGER;  Surgeon: Daryll Brod, MD;  Location: [place];  Service: Orthopedics;  Laterality: Right;  AXILLARY BLOCK  . IR ANGIOGRAM SELECTIVE EACH ADDITIONAL VESSEL  07/26/2017  . IR ANGIOGRAM SELECTIVE EACH ADDITIONAL VESSEL  07/26/2017  . IR ANGIOGRAM SELECTIVE EACH ADDITIONAL VESSEL  07/26/2017  . IR ANGIOGRAM SELECTIVE EACH ADDITIONAL VESSEL  07/26/2017  . IR ANGIOGRAM SELECTIVE EACH ADDITIONAL VESSEL  07/26/2017  . IR ANGIOGRAM SELECTIVE EACH ADDITIONAL VESSEL  08/11/2017  . IR ANGIOGRAM SELECTIVE EACH ADDITIONAL VESSEL  04/28/2018  . IR ANGIOGRAM VISCERAL SELECTIVE  07/26/2017  . IR ANGIOGRAM VISCERAL SELECTIVE  08/11/2017  . IR ANGIOGRAM VISCERAL SELECTIVE  04/28/2018  . IR EMBO ARTERIAL NOT HEMORR HEMANG INC GUIDE ROADMAPPING  07/26/2017  . IR EMBO TUMOR ORGAN ISCHEMIA INFARCT INC GUIDE ROADMAPPING  08/11/2017  . IR EMBO TUMOR ORGAN ISCHEMIA INFARCT INC GUIDE ROADMAPPING  04/28/2018  . IR RADIOLOGIST EVAL & MGMT  07/07/2017  . IR RADIOLOGIST EVAL & MGMT  09/07/2017  . IR RADIOLOGIST EVAL & MGMT  12/21/2017  . IR RADIOLOGIST EVAL & MGMT  03/22/2018  . IR RADIOLOGIST EVAL & MGMT  06/08/2018  . IR RADIOLOGIST EVAL & MGMT  11/01/2018  . IR RADIOLOGIST EVAL & MGMT  03/21/2019  . IR US GUIDE VASC ACCESS RIGHT  07/26/2017  . IR US GUIDE VASC ACCESS RIGHT  08/11/2017  . IR US GUIDE VASC ACCESS RIGHT  04/28/2018  . LUNG LOBECTOMY  06/10/13   upper left lung  . MULTIPLE TOOTH EXTRACTIONS    . REMOVAL RETAINED LENS Right 11/17/2015   Procedure: REMOVAL RETAINED LENS FROAGMENTS RIGHT EYE;  Surgeon: [name], MD;  Location: [place];   Service: Ophthalmology;  Laterality: Right;  . TONSILLECTOMY    . UPPER GASTROINTESTINAL ENDOSCOPY  11-03-15   Dr [name]  . VASECTOMY    . VIDEO ASSISTED THORACOSCOPY (VATS)/WEDGE RESECTION Left 06/04/2013   Procedure: VIDEO ASSISTED THORACOSCOPY (VATS)/WEDGE RESECTION;  Surgeon: [name], MD;  Location: [place];  Service: Thoracic;  Laterality: Left;  [name] VIDEO BRONCHOSCOPY N/A 06/04/2013   Procedure: VIDEO BRONCHOSCOPY;  Surgeon: [name], MD;  Location: [place];  Service: Thoracic;  Laterality: N/A;    Social History:  reports that he quit smoking about 20 years ago. His smoking use included cigarettes. He has a 40.00 pack-year smoking history. He has never used smokeless tobacco. He reports that he does not drink alcohol or use drugs.  Family History:  Family History  Problem Relation Age of Onset  . Stroke Mother   . Alzheimer's  disease Mother   . Hypertension Sister   . Hyperlipidemia Sister   . Hypertension Brother   . Hyperlipidemia Brother   . Diabetes Brother      Prior to Admission medications   Medication Sig Start Date End Date Taking? Authorizing Provider  amLODipine (NORVASC) 2.5 MG tablet Take 1 tablet by mouth once daily 01/01/19  Yes Gollan, Kathlene November, MD  atorvastatin (LIPITOR) 40 MG tablet TAKE 1 TABLET BY MOUTH ONCE DAILY FOR CHOLESTEROL 04/11/19  Yes Delsa Grana, PA-C  b complex vitamins capsule Take 1 capsule by mouth daily. AM   Yes [provider]  Calcium Carbonate-Vitamin D3 (CALCIUM 600/VITAMIN D) 600-400 MG-UNIT TABS Take 1 tablet by mouth daily.    Yes [provider]  Cinnamon 500 MG capsule Take 500 mg by mouth at bedtime. BEDTIME   Yes [provider]  fexofenadine (ALLEGRA) 180 MG tablet Take 180 mg by mouth daily.   Yes [provider]  finasteride (PROSCAR) 5 MG tablet Take 1 tablet (5 mg total) by mouth daily. 04/11/19  Yes Delsa Grana, PA-C  LANREOTIDE ACETATE [place] Inject 1 Dose into the skin every 30  (thirty) days.   Yes [provider]  losartan (COZAAR) 100 MG tablet Take 1 tablet (100 mg total) by mouth daily. 04/11/19  Yes Delsa Grana, PA-C  Multiple Vitamins-Minerals (MULTIVITAMIN WITH MINERALS) tablet Take 1 tablet by mouth daily. AM   Yes [provider]  polycarbophil (FIBERCON) 625 MG tablet Take 625 mg by mouth 2 (two) times daily. AM   Yes [provider]  polyethylene glycol (MIRALAX / GLYCOLAX) packet Take 17 g by mouth daily as needed.    Yes [provider]  tamsulosin (FLOMAX) 0.4 MG CAPS capsule Take 1 capsule (0.4 mg total) by mouth daily. Patient taking differently: Take 0.4 mg by mouth at bedtime.  04/11/19  Yes Delsa Grana, PA-C  vitamin C (ASCORBIC ACID) 500 MG tablet Take 500 mg by mouth daily. AM   Yes [provider]    Physical Exam: Vitals:   06/10/19 2300 06/11/19 0000 06/11/19 0030 06/11/19 0116  BP: 136/73 (!) 141/64 (!) 142/76 (!) 164/71  Pulse: 60 64 63 66  Resp: (!) 22 (!) 23  (!) 23  Temp:    99 F (37.2 C)  TempSrc:    Oral  SpO2:  94% 96% 93%  Weight:      Height:       General: Not in acute distress HEENT:       Eyes: PERRL, EOMI, no scleral icterus.       ENT: No discharge from the ears and nose, no pharynx injection, no tonsillar enlargement.        Neck: No JVD, no bruit, no mass felt. Heme: No neck lymph node enlargement. Cardiac: S1/S2, RRR, No murmurs, No gallops or rubs. Respiratory: Breath sounds diminished at left base, no rales, wheezing, rhonchi or rubs. GI: Soft, nondistended, nontender, no rebound pain, no organomegaly, BS present. GU: No hematuria Ext: No pitting leg edema bilaterally. Musculoskeletal: No joint deformities, No joint redness or warmth, no limitation of ROM in spin. Skin: No rashes.  Neuro: Alert, oriented X3, cranial nerves II-XII grossly intact, moves all extremities normally. Muscle strength 5/5 in all extremities. Psych: Patient is not psychotic, no suicidal or  hemocidal ideation.  Labs on Admission: I have personally reviewed following labs and imaging studies  CBC: Recent Labs  Lab 06/07/19 1100 06/10/19 2112  WBC 7.4 7.3  NEUTROABS 5.5 5.5  HGB 14.6 16.4  HCT 43.4 47.3  MCV 88.6 89.4  PLT 143* 102   Basic Metabolic Panel: Recent Labs  Lab 06/07/19 1100 06/10/19 2112  NA 134* 139  K 3.4* 3.6  CL 99 101  CO2 25 26  GLUCOSE 119* 138*  BUN 18 17  CREATININE 0.89 0.92  CALCIUM 8.5* 9.0   GFR: Estimated Creatinine Clearance: 57.2 mL/min (by C-G formula based on SCr of 0.92 mg/dL). Liver Function Tests: Recent Labs  Lab 06/10/19 2112  AST 74*  ALT 69*  ALKPHOS 247*  BILITOT 0.6  PROT 7.1  ALBUMIN 3.6   No results for input(s): LIPASE, AMYLASE in the last 168 hours. No results for input(s): AMMONIA in the last 168 hours. Coagulation Profile: No results for input(s): INR, PROTIME in the last 168 hours. Cardiac Enzymes: No results for input(s): CKTOTAL, CKMB, CKMBINDEX, TROPONINI in the last 168 hours. BNP (last 3 results) No results for input(s): PROBNP in the last 8760 hours. HbA1C: No results for input(s): HGBA1C in the last 72 hours. CBG: No results for input(s): GLUCAP in the last 168 hours. Lipid Profile: Recent Labs    06/10/19 2112  TRIG 139   Thyroid Function Tests: No results for input(s): TSH, T4TOTAL, FREET4, T3FREE, THYROIDAB in the last 72 hours. Anemia Panel: Recent Labs    06/10/19 2112  FERRITIN 1,342*   Urine analysis:    Component Value Date/Time   COLORURINE YELLOW 05/31/2013 1545   APPEARANCEUR Clear 12/17/2016 1423   LABSPEC 1.026 05/31/2013 1545   PHURINE 5.5 05/31/2013 1545   GLUCOSEU Negative 12/17/2016 1423   HGBUR NEGATIVE 05/31/2013 1545   BILIRUBINUR Negative 12/17/2016 1423   KETONESUR NEGATIVE 05/31/2013 1545   PROTEINUR Negative 12/17/2016 1423   PROTEINUR NEGATIVE 05/31/2013 1545   UROBILINOGEN 0.2 05/31/2013 1545   NITRITE Negative 12/17/2016 1423   NITRITE  NEGATIVE 05/31/2013 1545   LEUKOCYTESUR Negative 12/17/2016 1423   Sepsis Labs: @LABRCNTIP (procalcitonin:4,lacticidven:4) ) Recent Results (from the past 240 hour(s))  Novel Coronavirus, NAA (Hosp order, Send-out to Ref Lab; TAT 18-24 hrs     Status: Abnormal   Collection Time: 06/07/19 10:19 AM   Specimen: Nasopharyngeal Swab; Respiratory  Result Value Ref Range Status   SARS-CoV-2, NAA DETECTED (A) NOT DETECTED Final    Comment: [place] at [facility] notified at 1351 06/08/2019 by [place]. (NOTE)                  Client Requested Flag This nucleic acid amplification test was developed and its performance characteristics determined by [employer]. Nucleic acid amplification tests include RT-PCR and TMA. This test has not been FDA cleared or approved. This test has been authorized by FDA under an Emergency Use Authorization (EUA). This test is only authorized for the duration of time the declaration that circumstances exist justifying the authorization of the emergency use of in vitro diagnostic tests for detection of SARS-CoV-2 virus and/or diagnosis of COVID-19 infection under section 564(b)(1) of the Act, 21 U.S.C. 585IDP-8(E) (1), unless the authorization is terminated or revoked sooner. When diagnostic testing is negative, the possibility of a false negative result should be considered in the context of a patient's recent exposures and the presence of clinical signs and symptoms consistent with  COVID-19. An individual without symptoms of COVID- 19 and who is not shedding SARS-CoV-2 virus would expect to have a negative (not detected) result in this assay. Performed At: [facility] [address] [place], [place] [id number] Perlie Gold  Derinda Late MD UJ:[id number] Performed at [facility], [address], [place], [place] [address]    Coronavirus Source NASOPHARYNGEAL  Final    Comment: Performed at [facility], [address] [place], [place]  [id number]     Radiological Exams on Admission: CT ANGIO CHEST PE W OR WO CONTRAST  Result Date: 06/11/2019 CLINICAL DATA:  Shortness of breath.  COVID positive. EXAM: CT ANGIOGRAPHY CHEST WITH CONTRAST TECHNIQUE: Multidetector CT imaging of the chest was performed using the standard protocol during bolus administration of intravenous contrast. Multiplanar CT image reconstructions and MIPs were obtained to evaluate the vascular anatomy. CONTRAST:  49mL OMNIPAQUE IOHEXOL 350 MG/ML SOLN COMPARISON:  06/27/2016 FINDINGS: Cardiovascular: Normal heart size. No pericardial effusion. Atherosclerotic calcification of the aorta and coronaries. No pulmonary artery filling defect Mediastinum/Nodes: Negative for adenopathy or mass. Lungs/Pleura: Left upper lobectomy for bronchial carcinoid per chart. There is ground-glass opacity with a subpleural predilection in the right more than left lung consistent with history of COVID 19 positivity. Also, there is a irregular lobulated endobronchial mass at the level of the distal left mainstem bronchus, 2 cm in length. Bronchus appears somewhat thin along the esophageal margin but no clear esophageal fistulization. Upper Abdomen: Cholelithiasis. History of treated liver metastases, reference MRI 03/15/2019. Musculoskeletal: Spondylosis. Rounded sclerotic focus in the T11 vertebral body is stable from 2019. Review of the MIP images confirms the above findings. IMPRESSION: 1. Bilateral ground-glass pneumonia consistent with COVID-19 positivity. 2. 2 cm left main endobronchial mass close to a left upper lobectomy margin, presumably local recurrence of bronchial carcinoid. Electronically Signed   By: Monte Fantasia M.D.   On: 06/11/2019 04:28   DG Chest Port 1 View  Result Date: 06/10/2019 CLINICAL DATA:  Fever.  COVID-19 pneumonia. EXAM: PORTABLE CHEST 1 VIEW COMPARISON:  June 07, 2019 FINDINGS: Been significant interval development diffuse airspace opacities throughout the right  lung field with focal areas of consolidation involving the right lower lobe. There is an airspace opacity at the left lung base. There is no pneumothorax. No large pleural effusion. Aortic calcifications are noted. The heart size is normal. IMPRESSION: Significant interval development of bilateral airspace opacities, greatest on the right, concerning for multifocal pneumonia (viral or bacterial). Electronically Signed   By: Constance Holster M.D.   On: 06/10/2019 21:19    Assessment/Plan Principal Problem:   Pneumonia due to COVID-19 virus Active Problems:   Essential hypertension   S/P lobectomy of lung   Hyperlipidemia   BPH without urinary obstruction   Paroxysmal atrial flutter (HCC)   Coronary artery disease involving native heart without angina pectoris   History of lung cancer   Liver mass, left lobe   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   Type 2 diabetes mellitus ([place])   Acute respiratory failure with hypoxia (HCC)  Jason Malone is a 79 y.o. male with medical history significant of HTN,DM2,  CAD, h/o bronchus and lung carcinoid tumor, liver cancer in remission, PAF not on AC, BPH, COVID-19 positive on 1/28, presented with cough for 10 days, SOB for a few days.   Pneumonia due to COVID-19 virus Acute respiratory failure with hypoxia  COVID19 test positive on 1/28  chest x-ray showed bilateral opacity D dimer elevated, Chest CTA no PE, showed groundglass opacity Now requiring 4 L nasal cannula oxygen. continuous pulse Ox monitoring RT manage oxygen, breath treatment Proning Dexamethasone 6 mg IV daily Remdesivir IV CRP 9.2, Consider Tocilizumab Symptomatic and supportive  management   Elevated LFTs Likely due to COVID-19 infection  Endobronchial mass 2cm Seen on CT Consider recurrence of carcinoid tumor treated by Dr. Grayland Ormond for stage IV lung cancer, receiving injections monthly of lanreotide, will need follow up  HTN, HLD, CAD, PAF not on AC, BPH   Continue with home medications  DM2 SSI  DVT ppx: SQ Lovenox Code Status: Full code Family Communication: None at bed side.             Disposition Plan:  Anticipate discharge back to previous home environment Consults called:   Admission status:  Inpatient/tele          Date of Service 06/10/2019    [facility]   If 7PM-7AM, please contact night-coverage [email or web address] Password [facility] 06/11/2019, 4:58 AM

## 2019-06-10 NOTE — ED Triage Notes (Signed)
Pt arrives from home via [facility] w cc of shob and covid positive as of 1/28. Pt was sat at 84% on room air at home. Placed on 4L [place] and increased to 98%. Pt has hx lung cancer and had upper left lobe removed 2015.  Pt reported nausea. Given 4 zofran IV 20 G R hand

## 2019-06-10 NOTE — ED Provider Notes (Signed)
[facility] EMERGENCY DEPARTMENT Provider Note   CSN: [id number] Arrival date & time: 06/10/19  2058     History Chief Complaint  Patient presents with  . Shortness of Breath    Jason Malone is a 79 y.o. male history of hyperlipidemia, liver cancer, hypertension here presenting with shortness of breath .  Patient was tested positive for Covid on 1/28.  Patient had no known exposure .  Patient states that since then he has been monitoring his symptoms and has progressive shortness of breath .  He is also has pulse ox at home and states that today is consistently in the low 80 .  He has some low-grade temperature but denies any fevers.  Per EMS, his room air sat was 84% and patient was placed on 4 L nasal cannula.  The history is provided by the patient.       Past Medical History:  Diagnosis Date  . Allergic rhinitis   . Benign prostatic hypertrophy with lower urinary tract symptoms (LUTS)   . Chest pain    a. 05/2013 MV: EF 70%, no ischemia.  . Diverticulitis    DIVERTICULOSIS  . Enlarged prostate   . Essential hypertension    CONTROLLED ON MEDS  . Full dentures    upper and lower  . H/O hypokalemia   . Hx of atrial fibrillation without current medication    CARDIOLOGIST-DR [facility]  . 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 Hyperlipemia   . Hypomagnesemia   . IFG (impaired fasting glucose)   . Liver cancer ([place])   . Liver cancer ([place]) 12/08/2016  . Liver mass, left lobe 08/28/2016   Probably hemangioma; will get MRI of liver, refer to GI  . Lung cancer ([place])    a. carcinoid, left lung, Stage 1b (T2a, N0, cM0);  b. 05/2013 s/p VATS & LULobectomy.  . Monocytosis   . Osteopenia   . Paroxysmal atrial flutter ([facility])    a. 03/2013->no recurrence;  b. CHA2DS2VASc = 2-->not currently on anticoagulation;  c. 05/2012 Echo: EF 55-60%, normal RV.  [name] Wears glasses     Patient Active Problem List   Diagnosis Date Noted  .  Contracture of palmar fascia 10/11/2018  . Microalbuminuria due to type 2 diabetes mellitus ([place]) 04/12/2018  . Type 2 diabetes mellitus ([place]) 04/10/2018  . Osteopenia determined by x-ray 01/07/2017  . 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 Low back pain 12/17/2016  . Liver mass, left lobe 08/28/2016  . 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 History of lung cancer 06/29/2016  . Aortic atherosclerosis ([place]) 04/06/2016  . Coronary artery disease involving native heart without angina pectoris 04/06/2016  . History of colonic polyps 10/28/2015  . Medication monitoring encounter 08/15/2015  . Paroxysmal atrial flutter ([place])   . Elevated serum alkaline phosphatase level 03/05/2015  . Allergic rhinitis   . BPH without urinary obstruction   . Hyperlipidemia 02/13/2014  . Sinus bradycardia 07/18/2013  . S/P lobectomy of lung 06/04/2013  . Atrial flutter ([place]) 04/09/2013  . Smoking history 04/09/2013  . Essential hypertension 04/09/2013  . Rectal bleeding 12/15/2012    Past Surgical History:  Procedure Laterality Date  . BRONCHOSCOPY     04/06/2013  . CARDIOVASCULAR STRESS TEST  05/2013   a. No evidence of ischemia or infarct, EF 70%, no WMAs  . CATARACT EXTRACTION W/PHACO Left 07/14/2015   Procedure: CATARACT EXTRACTION PHACO  AND INTRAOCULAR LENS PLACEMENT (IOC);  Surgeon: [name], MD;  Location: [place];  Service: Ophthalmology;  Laterality: Left;  . CATARACT EXTRACTION W/PHACO Right 10/01/2015   Procedure: CATARACT EXTRACTION PHACO AND INTRAOCULAR LENS PLACEMENT (IOC);  Surgeon: [name], MD;  Location: [place];  Service: Ophthalmology;  Laterality: Right;  . COLONOSCOPY W/ POLYPECTOMY     bleed after-had to go to surgery to stop bleeding via colonoscopy  . COLONOSCOPY WITH PROPOFOL N/A 11/19/2015   Procedure: COLONOSCOPY WITH PROPOFOL;  Surgeon: [name], MD;  Location[facility] ENDOSCOPY;   Service: Endoscopy;  Laterality: N/A;  . DUPUYTREN CONTRACTURE RELEASE  12/15/2011   Procedure: DUPUYTREN CONTRACTURE RELEASE;  Surgeon: [name], MD;  Location: [place];  Service: Orthopedics;  Laterality: Left;  Fasciotomy left ring finger dupuytrens  . FASCIECTOMY Right 11/07/2018   Procedure: SEGMENTAL FASCIECTOMY RIGHT RING FINGER;  Surgeon: Daryll Brod, MD;  Location: [place];  Service: Orthopedics;  Laterality: Right;  AXILLARY BLOCK  . IR ANGIOGRAM SELECTIVE EACH ADDITIONAL VESSEL  07/26/2017  . IR ANGIOGRAM SELECTIVE EACH ADDITIONAL VESSEL  07/26/2017  . IR ANGIOGRAM SELECTIVE EACH ADDITIONAL VESSEL  07/26/2017  . IR ANGIOGRAM SELECTIVE EACH ADDITIONAL VESSEL  07/26/2017  . IR ANGIOGRAM SELECTIVE EACH ADDITIONAL VESSEL  07/26/2017  . IR ANGIOGRAM SELECTIVE EACH ADDITIONAL VESSEL  08/11/2017  . IR ANGIOGRAM SELECTIVE EACH ADDITIONAL VESSEL  04/28/2018  . IR ANGIOGRAM VISCERAL SELECTIVE  07/26/2017  . IR ANGIOGRAM VISCERAL SELECTIVE  08/11/2017  . IR ANGIOGRAM VISCERAL SELECTIVE  04/28/2018  . IR EMBO ARTERIAL NOT HEMORR HEMANG INC GUIDE ROADMAPPING  07/26/2017  . IR EMBO TUMOR ORGAN ISCHEMIA INFARCT INC GUIDE ROADMAPPING  08/11/2017  . IR EMBO TUMOR ORGAN ISCHEMIA INFARCT INC GUIDE ROADMAPPING  04/28/2018  . IR RADIOLOGIST EVAL & MGMT  07/07/2017  . IR RADIOLOGIST EVAL & MGMT  09/07/2017  . IR RADIOLOGIST EVAL & MGMT  12/21/2017  . IR RADIOLOGIST EVAL & MGMT  03/22/2018  . IR RADIOLOGIST EVAL & MGMT  06/08/2018  . IR RADIOLOGIST EVAL & MGMT  11/01/2018  . IR RADIOLOGIST EVAL & MGMT  03/21/2019  . IR US GUIDE VASC ACCESS RIGHT  07/26/2017  . IR US GUIDE VASC ACCESS RIGHT  08/11/2017  . IR US GUIDE VASC ACCESS RIGHT  04/28/2018  . LUNG LOBECTOMY  06/10/13   upper left lung  . MULTIPLE TOOTH EXTRACTIONS    . REMOVAL RETAINED LENS Right 11/17/2015   Procedure: REMOVAL RETAINED LENS FROAGMENTS RIGHT EYE;  Surgeon: [name], MD;  Location: [place];   Service: Ophthalmology;  Laterality: Right;  . TONSILLECTOMY    . UPPER GASTROINTESTINAL ENDOSCOPY  11-03-15   Dr [name]  . VASECTOMY    . VIDEO ASSISTED THORACOSCOPY (VATS)/WEDGE RESECTION Left 06/04/2013   Procedure: VIDEO ASSISTED THORACOSCOPY (VATS)/WEDGE RESECTION;  Surgeon: [name], MD;  Location: [place];  Service: Thoracic;  Laterality: Left;  [name] VIDEO BRONCHOSCOPY N/A 06/04/2013   Procedure: VIDEO BRONCHOSCOPY;  Surgeon: [name], MD;  Location: [facility] OR;  Service: Thoracic;  Laterality: N/A;       Family History  Problem Relation Age of Onset  . Stroke Mother   . Alzheimer's disease Mother   . Hypertension Sister   . Hyperlipidemia Sister   . Hypertension Brother   . Hyperlipidemia Brother   . Diabetes Brother     Social History   Tobacco Use  . Smoking status: Former Smoker  Packs/day: 1.00    Years: 40.00    Pack years: 40.00    Types: Cigarettes    Quit date: 12/09/1998    Years since quitting: 20.5  . Smokeless tobacco: Never Used  Substance Use Topics  . Alcohol use: No  . Drug use: No    Home Medications Prior to Admission medications   Medication Sig Start Date End Date Taking? Authorizing Provider  amLODipine (NORVASC) 2.5 MG tablet Take 1 tablet by mouth once daily 01/01/19   Minna Merritts, MD  atorvastatin (LIPITOR) 40 MG tablet TAKE 1 TABLET BY MOUTH ONCE DAILY FOR CHOLESTEROL 04/11/19   Delsa Grana, PA-C  b complex vitamins capsule Take 1 capsule by mouth daily. AM    [provider]  Calcium Carbonate-Vitamin D3 (CALCIUM 600/VITAMIN D) 600-400 MG-UNIT TABS Take by mouth daily.    [provider]  Cinnamon 500 MG capsule Take 500 mg by mouth daily. AM AND BEDTIME    [provider]  fexofenadine (ALLEGRA) 180 MG tablet Take 180 mg by mouth daily.    [provider]  finasteride (PROSCAR) 5 MG tablet Take 1 tablet (5 mg total) by mouth daily. 04/11/19   Delsa Grana, PA-C  LANREOTIDE ACETATE [place]  Inject 1 Dose into the skin every 30 (thirty) days.    [provider]  losartan (COZAAR) 100 MG tablet Take 1 tablet (100 mg total) by mouth daily. 04/11/19   Delsa Grana, PA-C  Multiple Vitamins-Minerals (MULTIVITAMIN WITH MINERALS) tablet Take 1 tablet by mouth daily. AM    [provider]  polycarbophil (FIBERCON) 625 MG tablet Take 625 mg by mouth daily. AM    [provider]  polyethylene glycol (MIRALAX / GLYCOLAX) packet Take 17 g by mouth daily as needed.     [provider]  tamsulosin (FLOMAX) 0.4 MG CAPS capsule Take 1 capsule (0.4 mg total) by mouth daily. 04/11/19   Delsa Grana, PA-C  vitamin C (ASCORBIC ACID) 500 MG tablet Take 500 mg by mouth daily. AM    [provider]    Allergies    Latex and Tape  Review of Systems   Review of Systems  Respiratory: Positive for shortness of breath.   All other systems reviewed and are negative.   Physical Exam Updated Vital Signs BP (!) 164/76 (BP Location: Right Arm)   Pulse 71   Temp 98.6 F (37 C) (Oral)   Resp (!) 24   Ht 5\' 4"  (1.626 m)   Wt 65.8 kg   SpO2 96%   BMI 24.89 kg/m   Physical Exam Vitals and nursing note reviewed.  Constitutional:      Comments: tachypneic   HENT:     Head: Normocephalic.     Mouth/Throat:     Mouth: Mucous membranes are moist.  Eyes:     Pupils: Pupils are equal, round, and reactive to light.  Cardiovascular:     Rate and Rhythm: Normal rate and regular rhythm.  Pulmonary:     Comments: Tachypneic, diminished throughout  Abdominal:     Palpations: Abdomen is soft.  Musculoskeletal:        General: Normal range of motion.     Cervical back: Normal range of motion.  Skin:    General: Skin is warm.     Capillary Refill: Capillary refill takes less than 2 seconds.  Neurological:     General: No focal deficit present.     Mental Status: He is oriented to person,  place, and time.  Psychiatric:        Mood and Affect: Mood normal.         Behavior: Behavior normal.     ED Results / Procedures / Treatments   Labs (all labs ordered are listed, but only abnormal results are displayed) Labs Reviewed  CBC WITH DIFFERENTIAL/PLATELET - Abnormal; Notable for the following components:      Result Value   Lymphs Abs 0.6 (*)    Monocytes Absolute 1.3 (*)    All other components within normal limits  COMPREHENSIVE METABOLIC PANEL - Abnormal; Notable for the following components:   Glucose, Bld 138 (*)    AST 74 (*)    ALT 69 (*)    Alkaline Phosphatase 247 (*)    All other components within normal limits  FIBRIN DERIVATIVES D-DIMER ([facility] ONLY) - Abnormal; Notable for the following components:   Fibrin derivatives D-dimer ([facility]) 1,330.76 (*)    All other components within normal limits  LACTATE DEHYDROGENASE - Abnormal; Notable for the following components:   LDH 385 (*)    All other components within normal limits  FERRITIN - Abnormal; Notable for the following components:   Ferritin 1,342 (*)    All other components within normal limits  FIBRINOGEN - Abnormal; Notable for the following components:   Fibrinogen 717 (*)    All other components within normal limits  CULTURE, BLOOD (ROUTINE X 2)  CULTURE, BLOOD (ROUTINE X 2)  LACTIC ACID, PLASMA  PROCALCITONIN  LACTIC ACID, PLASMA  TRIGLYCERIDES  C-REACTIVE PROTEIN    EKG EKG Interpretation  Date/Time:  Sunday June 10 2019 21:03:01 EST Ventricular Rate:  69 PR Interval:    QRS Duration: 86 QT Interval:  406 QTC Calculation: 435 R Axis:   21 Text Interpretation: Sinus rhythm Borderline T wave abnormalities Baseline wander in lead(s) V5 No significant change since last tracing Confirmed by Wandra Arthurs ([id number]) on 06/10/2019 9:03:44 PM   Radiology DG Chest Port 1 View  Result Date: 06/10/2019 CLINICAL DATA:  Fever.  COVID-19 pneumonia. EXAM: PORTABLE CHEST 1 VIEW COMPARISON:  June 07, 2019 FINDINGS: Been significant interval development diffuse airspace  opacities throughout the right lung field with focal areas of consolidation involving the right lower lobe. There is an airspace opacity at the left lung base. There is no pneumothorax. No large pleural effusion. Aortic calcifications are noted. The heart size is normal. IMPRESSION: Significant interval development of bilateral airspace opacities, greatest on the right, concerning for multifocal pneumonia (viral or bacterial). Electronically Signed   By: Constance Holster M.D.   On: 06/10/2019 21:19    Procedures Procedures (including critical care time)  CRITICAL CARE Performed by: Wandra Arthurs   Total critical care time: 30 minutes  Critical care time was exclusive of separately billable procedures and treating other patients.  Critical care was necessary to treat or prevent imminent or life-threatening deterioration.  Critical care was time spent personally by me on the following activities: development of treatment plan with patient and/or surrogate as well as nursing, discussions with consultants, evaluation of patient's response to treatment, examination of patient, obtaining history from patient or surrogate, ordering and performing treatments and interventions, ordering and review of laboratory studies, ordering and review of radiographic studies, pulse oximetry and re-evaluation of patient's condition.   Medications Ordered in ED Medications - No data to display  ED Course  I have reviewed the triage vital signs and the nursing notes.  Pertinent labs & imaging results  that were available during my care of the patient were reviewed by me and considered in my medical decision making (see chart for details).    MDM Rules/Calculators/A&P                      Ilya Viona Gilmore Bruemmer is a 79 y.o. male recent Covid diagnosis here presenting with hypoxia .  Patient is hypoxic to 84% at home.  Patient is doing well on 4 L nasal cannula .  Will get Covid preadmission labs and likely will need  admission.  10:37 PM Labs showed elevated inflammatory markers. CXR showed possible COVID. procalcitonin 0.11. Will admit for hypoxia from [place].   Final Clinical Impression(s) / ED Diagnoses Final diagnoses:  None    Rx / DC Orders ED Discharge Orders    None       Drenda Freeze, MD 06/10/19 2240

## 2019-06-11 ENCOUNTER — Inpatient Hospital Stay: Payer: Medicare Other

## 2019-06-11 ENCOUNTER — Encounter: Payer: Self-pay | Admitting: Internal Medicine

## 2019-06-11 ENCOUNTER — Inpatient Hospital Stay: Payer: Medicare Other | Admitting: Oncology

## 2019-06-11 DIAGNOSIS — J9690 Respiratory failure, unspecified, unspecified whether with hypoxia or hypercapnia: Secondary | ICD-10-CM | POA: Diagnosis present

## 2019-06-11 DIAGNOSIS — R7989 Other specified abnormal findings of blood chemistry: Secondary | ICD-10-CM | POA: Diagnosis present

## 2019-06-11 LAB — COMPREHENSIVE METABOLIC PANEL
ALT: 54 U/L — ABNORMAL HIGH (ref 0–44)
AST: 60 U/L — ABNORMAL HIGH (ref 15–41)
Albumin: 3 g/dL — ABNORMAL LOW (ref 3.5–5.0)
Alkaline Phosphatase: 198 U/L — ABNORMAL HIGH (ref 38–126)
Anion gap: 10 (ref 5–15)
BUN: 18 mg/dL (ref 8–23)
CO2: 23 mmol/L (ref 22–32)
Calcium: 8.2 mg/dL — ABNORMAL LOW (ref 8.9–10.3)
Chloride: 105 mmol/L (ref 98–111)
Creatinine, Ser: 0.77 mg/dL (ref 0.61–1.24)
GFR calc Af Amer: >60 mL/min (ref >60)
GFR calc non Af Amer: >60 mL/min (ref >60)
Glucose, Bld: 195 mg/dL — ABNORMAL HIGH (ref 70–99)
Potassium: 4.2 mmol/L (ref 3.5–5.1)
Sodium: 138 mmol/L (ref 135–145)
Total Bilirubin: 0.4 mg/dL (ref 0.3–1.2)
Total Protein: 6.2 g/dL — ABNORMAL LOW (ref 6.5–8.1)

## 2019-06-11 LAB — CBC WITH DIFFERENTIAL/PLATELET
Abs Immature Granulocytes: 0.05 10*3/uL (ref 0.00–0.07)
Basophils Absolute: 0 10*3/uL (ref 0.0–0.1)
Basophils Relative: 0 %
Eosinophils Absolute: 0 10*3/uL (ref 0.0–0.5)
Eosinophils Relative: 0 %
HCT: 41.7 % (ref 39.0–52.0)
Hemoglobin: 14 g/dL (ref 13.0–17.0)
Immature Granulocytes: 1 %
Lymphocytes Relative: 7 %
Lymphs Abs: 0.4 10*3/uL — ABNORMAL LOW (ref 0.7–4.0)
MCH: 30.3 pg (ref 26.0–34.0)
MCHC: 33.6 g/dL (ref 30.0–36.0)
MCV: 90.3 fL (ref 80.0–100.0)
Monocytes Absolute: 0.4 10*3/uL (ref 0.1–1.0)
Monocytes Relative: 7 %
Neutro Abs: 4.6 10*3/uL (ref 1.7–7.7)
Neutrophils Relative %: 85 %
Platelets: 186 10*3/uL (ref 150–400)
RBC: 4.62 MIL/uL (ref 4.22–5.81)
RDW: 14.7 % (ref 11.5–15.5)
WBC: 5.4 10*3/uL (ref 4.0–10.5)
nRBC: 0 % (ref 0.0–0.2)

## 2019-06-11 LAB — C-REACTIVE PROTEIN
CRP: 9.2 mg/dL — ABNORMAL HIGH (ref <1.0)
CRP: 8.9 mg/dL — ABNORMAL HIGH (ref <1.0)

## 2019-06-11 LAB — FIBRIN DERIVATIVES D-DIMER (ARMC ONLY): Fibrin derivatives D-dimer (ARMC): 1132.86 ng/mL (FEU) — ABNORMAL HIGH (ref 0.00–499.00)

## 2019-06-11 LAB — LACTIC ACID, PLASMA: Lactic Acid, Venous: 1.3 mmol/L (ref 0.5–1.9)

## 2019-06-11 LAB — FERRITIN: Ferritin: 1089 ng/mL — ABNORMAL HIGH (ref 24–336)

## 2019-06-11 LAB — MAGNESIUM: Magnesium: 1.9 mg/dL (ref 1.7–2.4)

## 2019-06-11 MED ORDER — AMLODIPINE BESYLATE 5 MG PO TABS
2.5000 mg | ORAL_TABLET | Freq: Every day | ORAL | Status: DC
  Administered 2019-06-11 – 2019-06-12 (×2): 2.5 mg via ORAL
  Filled 2019-06-11 (×2): qty 1

## 2019-06-11 MED ORDER — CALCIUM POLYCARBOPHIL 625 MG PO TABS
625.0000 mg | ORAL_TABLET | Freq: Two times a day (BID) | ORAL | Status: DC
  Administered 2019-06-11 – 2019-06-13 (×6): 625 mg via ORAL
  Filled 2019-06-11 (×8): qty 1

## 2019-06-11 MED ORDER — ADULT MULTIVITAMIN W/MINERALS CH
1.0000 | ORAL_TABLET | Freq: Every day | ORAL | Status: DC
  Administered 2019-06-11 – 2019-06-13 (×3): 1 via ORAL
  Filled 2019-06-11 (×3): qty 1

## 2019-06-11 MED ORDER — IPRATROPIUM-ALBUTEROL 20-100 MCG/ACT IN AERS
1.0000 | INHALATION_SPRAY | Freq: Four times a day (QID) | RESPIRATORY_TRACT | Status: DC
  Administered 2019-06-11 – 2019-06-13 (×9): 1 via RESPIRATORY_TRACT
  Filled 2019-06-11: qty 4

## 2019-06-11 MED ORDER — TAMSULOSIN HCL 0.4 MG PO CAPS
0.4000 mg | ORAL_CAPSULE | Freq: Every day | ORAL | Status: DC
  Administered 2019-06-11 – 2019-06-13 (×3): 0.4 mg via ORAL
  Filled 2019-06-11 (×3): qty 1

## 2019-06-11 MED ORDER — LORATADINE 10 MG PO TABS
10.0000 mg | ORAL_TABLET | Freq: Every day | ORAL | Status: DC
  Administered 2019-06-11 – 2019-06-13 (×3): 10 mg via ORAL
  Filled 2019-06-11 (×3): qty 1

## 2019-06-11 MED ORDER — ATORVASTATIN CALCIUM 20 MG PO TABS: 40.0000 mg | ORAL_TABLET | Freq: Every day | ORAL | Status: DC

## 2019-06-11 MED ORDER — ATORVASTATIN CALCIUM 20 MG PO TABS
40.0000 mg | ORAL_TABLET | Freq: Every day | ORAL | Status: DC
  Administered 2019-06-11 – 2019-06-13 (×3): 40 mg via ORAL
  Filled 2019-06-11 (×3): qty 2

## 2019-06-11 MED ORDER — ASCORBIC ACID 500 MG PO TABS
500.0000 mg | ORAL_TABLET | Freq: Every day | ORAL | Status: DC
  Administered 2019-06-11 – 2019-06-13 (×3): 500 mg via ORAL
  Filled 2019-06-11 (×3): qty 1

## 2019-06-11 MED ORDER — DEXAMETHASONE 4 MG PO TABS
6.0000 mg | ORAL_TABLET | Freq: Every day | ORAL | Status: DC
  Administered 2019-06-11 – 2019-06-13 (×3): 6 mg via ORAL
  Filled 2019-06-11: qty 2
  Filled 2019-06-11: qty 1.5
  Filled 2019-06-11: qty 2

## 2019-06-11 MED ORDER — LOSARTAN POTASSIUM 50 MG PO TABS
100.0000 mg | ORAL_TABLET | Freq: Every day | ORAL | Status: DC
  Administered 2019-06-11 – 2019-06-13 (×3): 100 mg via ORAL
  Filled 2019-06-11 (×3): qty 2

## 2019-06-11 MED ORDER — RENA-VITE PO TABS
1.0000 | ORAL_TABLET | Freq: Every day | ORAL | Status: DC
  Administered 2019-06-11 – 2019-06-13 (×3): 1 via ORAL
  Filled 2019-06-11 (×5): qty 1

## 2019-06-11 MED ORDER — FINASTERIDE 5 MG PO TABS
5.0000 mg | ORAL_TABLET | Freq: Every day | ORAL | Status: DC
  Administered 2019-06-11 – 2019-06-13 (×3): 5 mg via ORAL
  Filled 2019-06-11 (×3): qty 1

## 2019-06-11 MED ORDER — CALCIUM CARBONATE-VITAMIN D 500-200 MG-UNIT PO TABS
1.0000 | ORAL_TABLET | Freq: Every day | ORAL | Status: DC
  Administered 2019-06-11 – 2019-06-13 (×3): 1 via ORAL
  Filled 2019-06-11 (×3): qty 1

## 2019-06-11 MED ORDER — IOHEXOL 350 MG/ML SOLN
75.0000 mL | Freq: Once | INTRAVENOUS | Status: AC | PRN
  Administered 2019-06-11: 75 mL via INTRAVENOUS

## 2019-06-11 NOTE — Progress Notes (Signed)
Spoke with wife on phone and gave update on patient.

## 2019-06-11 NOTE — Progress Notes (Signed)
Remdesivir - Pharmacy Brief Note   O:  CXR: IMPRESSION: Significant interval development of bilateral airspace opacities, greatest on the right, concerning for multifocal pneumonia (viral or bacterial). SpO2: 95 - 92% on 4L [place]   A/P:  Remdesivir 200 mg IVPB once followed by 100 mg IVPB daily x 4 days.   Tobie Lords, PharmD, BCPS Clinical Pharmacist 06/11/2019 3:38 AM

## 2019-06-11 NOTE — Progress Notes (Signed)
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 Caucasian male with medical history significant of HTN, HLD, DM2, CAD, h/o bronchus and lung carcinoid tumor, liver cancer in remission, PAF not on AC, BPH, COVID-19 positive on 1/28, presented to ED with cough for 10 days, SOB for a few days.    ED Course: pt was afebrile, O2 sat > 96% on 4L.  Labs no leukocytosis.  CXR bilateral opacity.  D-dimer and CRP elevated.  Patient was given dexamethasone and admitted with COVID-19 pneumonia with hypoxia.   Pneumonia due to COVID-19 virus Acute respiratory failure with hypoxia  COVID19 test positive on 1/28  chest x-ray showed bilateral opacity D dimer elevated, Chest CTA no PE, showed groundglass opacity --requiring 4 L nasal cannula oxygen on presentation, increased to 8L HighFlow today, though in no distress. PLAN: --continuous pulse Ox monitoring --Dexamethasone 6 mg IV daily --Remdesivir IV --Start combivent QID --If O2 requirement reaches 10L, will initiate transfer to stepdown  Elevated LFTs Likely due to COVID-19 infection  Endobronchial mass 2cm Seen on CT Consider recurrence of carcinoid tumor treated by Dr. Grayland Ormond for stage IV lung cancer, receiving injections monthly oflanreotide, will need follow up  HTN, HLD, CAD, PAF not on AC, BPH  Continue with home medications  DM2 SSI    DVT prophylaxis: Lovenox SQ Code Status: Full code  Family Communication: not today Disposition Plan: Home   Subjective and Interval History:  Pt reported feeling better, N/V improved.  Eating and drinking ok.  No fever, chest pain, abdominal pain, N/V/D, dysuria.  Later, pt's O2 requirement from 4L to 8L, but asymptomatic and in no distress.     Objective: Vitals:   06/11/19 0030 06/11/19 0116 06/11/19 0905 06/11/19 1615  BP: (!) 142/76 (!) 164/71 129/90 (!) 146/69  Pulse: 63 66 62 66  Resp:  (!) 23 (!) 23 (!) 27  Temp:  99 F (37.2 C) 98.2 F (36.8 C) 98.1 F (36.7 C)  TempSrc:  Oral Oral Oral  SpO2: 96% 93% 92% (!) 86%  Weight:      Height:        Intake/Output Summary (Last 24 hours) at 06/11/2019 1741 Last data filed at 06/11/2019 0600 Gross per 24 hour  Intake 370 ml  Output 175 ml  Net 195 ml   Filed Weights   06/10/19 2108  Weight: 65.8 kg    Examination:   Constitutional: NAD, AAOx3 HEENT: conjunctivae and lids normal, EOMI CV: RRR 2+ systolic murmur at upper sternal boarders. Distal pulses +2.  No cyanosis.   RESP: CTA B/L, normal respiratory effort, on 4L GI: +BS, NTND Extremities: No effusions, edema, or tenderness in BLE SKIN: warm, dry and intact Neuro: II - XII grossly intact.  Sensation intact Psych: Normal mood and affect.  Appropriate judgement and reason  Data Reviewed: I have personally reviewed following labs and imaging studies  CBC: Recent Labs  Lab 06/07/19 1100 06/10/19 2112 06/11/19 0718  WBC 7.4 7.3 5.4  NEUTROABS 5.5 5.5 4.6  HGB 14.6 16.4 14.0  HCT 43.4 47.3 41.7  MCV 88.6 89.4 90.3  PLT 143* 185 010   Basic Metabolic Panel: Recent Labs  Lab 06/07/19 1100 06/10/19 2112 06/11/19 0718  NA 134* 139 138  K 3.4* 3.6 4.2  CL 99 101 105  CO2 25 26 23   GLUCOSE 119* 138* 195*  BUN 18 17 18   CREATININE 0.89 0.92 0.77  CALCIUM 8.5* 9.0 8.2*  MG  --   --  1.9   GFR: Estimated Creatinine Clearance: 65.8 mL/min (by C-G  formula based on SCr of 0.77 mg/dL). Liver Function Tests: Recent Labs  Lab 06/10/19 2112 06/11/19 0718  AST 74* 60*  ALT 69* 54*  ALKPHOS 247* 198*  BILITOT 0.6 0.4  PROT 7.1 6.2*  ALBUMIN 3.6 3.0*   No results for input(s): LIPASE, AMYLASE in the last 168 hours. No results for input(s): AMMONIA in the last 168 hours. Coagulation Profile: No results for input(s): INR, PROTIME in the last 168 hours. Cardiac Enzymes: No results for input(s): CKTOTAL, CKMB, CKMBINDEX, TROPONINI in the last 168 hours. BNP (last 3 results) No results for input(s): PROBNP in the last 8760 hours. HbA1C: No results for input(s): HGBA1C in the last 72 hours. CBG: No results for input(s): GLUCAP in the last 168 hours. Lipid Profile: Recent Labs    06/10/19 2112  TRIG 139   Thyroid Function Tests: No results for input(s): TSH, T4TOTAL, FREET4, T3FREE, THYROIDAB in the last 72 hours. Anemia Panel: Recent Labs    06/10/19 2112 06/11/19 0718  FERRITIN 1,342* 1,089*   Sepsis Labs: Recent Labs  Lab 06/10/19 2112 06/10/19 2319  PROCALCITON 0.11  --   LATICACIDVEN 1.6 1.3    Recent Results (from the past 240 hour(s))  Novel Coronavirus, NAA (Hosp order, Send-out to Ref Lab; TAT 18-24 hrs     Status: Abnormal   Collection Time: 06/07/19 10:19 AM   Specimen: Nasopharyngeal Swab; Respiratory  Result Value Ref Range Status   SARS-CoV-2, NAA DETECTED (A) NOT DETECTED Final    Comment: [place] at [facility] notified at 1351 06/08/2019 by [place]. (NOTE)                  Client Requested Flag This nucleic acid amplification test was developed and its performance characteristics determined by [employer]. Nucleic acid amplification tests include RT-PCR and TMA. This test has not been FDA cleared or approved. This test has been authorized by FDA under an Emergency Use Authorization (EUA). This test is only authorized for the duration of time the declaration that circumstances exist justifying the  authorization of the emergency use of in vitro diagnostic tests for detection of SARS-CoV-2 virus and/or diagnosis of COVID-19 infection under section 564(b)(1) of the Act, 21 U.S.C. 932TFT-7(D) (1), unless the authorization is terminated or revoked sooner. When diagnostic testing is negative, the possibility of a false negative result should be considered in the context of a patient's recent exposures and the presence of clinical signs and symptoms consistent with  COVID-19. An individual without symptoms of COVID- 19 and who is not shedding SARS-CoV-2 virus would expect to have a negative (not detected) result in this assay. Performed At: [facility] [place], [place] [id number] Rush Farmer MD WC:[id number] Performed at [facility], [place]  [address] [place], [place] [address]    Coronavirus Source NASOPHARYNGEAL  Final    Comment: Performed at [facility], [address] [place], [place] [address]  Blood Culture (routine x 2)     Status: None (Preliminary result)   Collection Time: 06/10/19  9:12 PM   Specimen: BLOOD  Result Value Ref Range Status   Specimen Description BLOOD BLOOD RIGHT WRIST  Final   Special Requests   Final    BOTTLES DRAWN AEROBIC AND ANAEROBIC Blood Culture adequate volume   Culture   Final    NO GROWTH < 12 HOURS Performed at [facility], [address], [place], [place] [address]    Report Status PENDING  Incomplete  Blood Culture (routine x 2)     Status: None (Preliminary result)   Collection Time: 06/10/19  9:12 PM   Specimen: BLOOD  Result Value Ref Range Status   Specimen Description BLOOD LEFT ANTECUBITAL  Final   Special Requests   Final    BOTTLES DRAWN AEROBIC AND ANAEROBIC Blood Culture adequate volume   Culture   Final    NO GROWTH < 12 HOURS Performed at [facility], [address], [place], [place] [address]    Report Status PENDING  Incomplete      Radiology  Studies: CT ANGIO CHEST PE W OR WO CONTRAST  Result Date: 06/11/2019 CLINICAL DATA:  Shortness of breath.  COVID positive. EXAM: CT ANGIOGRAPHY CHEST WITH CONTRAST TECHNIQUE: Multidetector CT imaging of the chest was performed using the standard protocol during bolus administration of intravenous contrast. Multiplanar CT image reconstructions and MIPs were obtained to evaluate the vascular anatomy. CONTRAST:  63mL OMNIPAQUE IOHEXOL 350 MG/ML SOLN COMPARISON:  06/27/2016 FINDINGS: Cardiovascular: Normal heart size. No pericardial effusion. Atherosclerotic calcification of the aorta and coronaries. No pulmonary artery filling defect Mediastinum/Nodes: Negative for adenopathy or mass. Lungs/Pleura: Left upper lobectomy for bronchial carcinoid per chart. There is ground-glass opacity with a subpleural predilection in the right more than left lung consistent with history of COVID 19 positivity. Also, there is a irregular lobulated endobronchial mass at the level of the distal left mainstem bronchus, 2 cm in length. Bronchus appears somewhat thin along the esophageal margin but no clear esophageal fistulization. Upper Abdomen: Cholelithiasis. History of treated liver metastases, reference MRI 03/15/2019. Musculoskeletal: Spondylosis. Rounded sclerotic focus in the T11 vertebral body is stable from 2019. Review of the MIP images confirms the above findings. IMPRESSION: 1. Bilateral ground-glass pneumonia consistent with COVID-19 positivity. 2. 2 cm left main endobronchial mass close to a left upper lobectomy margin, presumably local recurrence of bronchial carcinoid. Electronically Signed   By: Monte Fantasia M.D.   On: 06/11/2019 04:28   DG Chest Port 1 View  Result Date: 06/10/2019 CLINICAL DATA:  Fever.  COVID-19 pneumonia. EXAM: PORTABLE CHEST 1 VIEW COMPARISON:  June 07, 2019 FINDINGS: Been significant interval development diffuse airspace opacities throughout the right lung field with focal areas of  consolidation involving the right lower lobe. There is an airspace opacity at the left lung base. There is no pneumothorax. No large pleural effusion. Aortic calcifications are noted. The heart size is normal. IMPRESSION: Significant interval development of bilateral airspace opacities, greatest on the right, concerning for multifocal pneumonia (viral or bacterial). Electronically Signed   By: Constance Holster M.D.   On: 06/10/2019 21:19     Scheduled Meds: . amLODipine  2.5 mg Oral Daily  . vitamin C  500 mg Oral Daily  .  atorvastatin  40 mg Oral q1800  . calcium-vitamin D  1 tablet Oral Daily  . dexamethasone  6 mg Oral Daily  . enoxaparin (LOVENOX) injection  40 mg Subcutaneous Q24H  . finasteride  5 mg Oral Daily  . loratadine  10 mg Oral Daily  . losartan  100 mg Oral Daily  . multivitamin  1 tablet Oral Daily  . multivitamin with minerals  1 tablet Oral Daily  . polycarbophil  625 mg Oral BID  . tamsulosin  0.4 mg Oral QHS   Continuous Infusions: . remdesivir 100 mg in NS 100 mL Stopped (06/11/19 1032)     LOS: 1 day     Enzo Bi, MD [facility] If 7PM-7AM, please contact night-coverage 06/11/2019, 5:41 PM

## 2019-06-11 NOTE — Progress Notes (Signed)
Patients O2 sats dropped to 84%, turned him up to 6 LPM via [place] but O2 sats only coming up to 87%. Got pt to take deep breaths it comes up to 91% but as soon as he breathes normal its back down to the 80s. Spoke with Dr. Billie Ruddy she ordered to have patient put on HIGHFLOW O2 at 7-8 LPM but if pt goes toward 10 LPM he would have to be transferred.  Placed pt on 8LPM high Flow and is 91%. Dr. Billie Ruddy said goal for him is >=88%. If he is >10L or in distress, to let her know and she will transfer him to stepdown.Dr. Billie Ruddy will let the night team know too. Patient is asymptomatic but states he feels better with the HIGHFLOW on.

## 2019-06-12 LAB — CBC WITH DIFFERENTIAL/PLATELET
Abs Immature Granulocytes: 0.1 10*3/uL — ABNORMAL HIGH (ref 0.00–0.07)
Basophils Absolute: 0 10*3/uL (ref 0.0–0.1)
Basophils Relative: 0 %
Eosinophils Absolute: 0 10*3/uL (ref 0.0–0.5)
Eosinophils Relative: 0 %
HCT: 42.7 % (ref 39.0–52.0)
Hemoglobin: 14.2 g/dL (ref 13.0–17.0)
Immature Granulocytes: 1 %
Lymphocytes Relative: 5 %
Lymphs Abs: 0.7 10*3/uL (ref 0.7–4.0)
MCH: 29.9 pg (ref 26.0–34.0)
MCHC: 33.3 g/dL (ref 30.0–36.0)
MCV: 89.9 fL (ref 80.0–100.0)
Monocytes Absolute: 1.7 10*3/uL — ABNORMAL HIGH (ref 0.1–1.0)
Monocytes Relative: 12 %
Neutro Abs: 11 10*3/uL — ABNORMAL HIGH (ref 1.7–7.7)
Neutrophils Relative %: 82 %
Platelets: 208 10*3/uL (ref 150–400)
RBC: 4.75 MIL/uL (ref 4.22–5.81)
RDW: 14.8 % (ref 11.5–15.5)
WBC: 13.5 10*3/uL — ABNORMAL HIGH (ref 4.0–10.5)
nRBC: 0 % (ref 0.0–0.2)

## 2019-06-12 LAB — COMPREHENSIVE METABOLIC PANEL
ALT: 57 U/L — ABNORMAL HIGH (ref 0–44)
AST: 61 U/L — ABNORMAL HIGH (ref 15–41)
Albumin: 3.1 g/dL — ABNORMAL LOW (ref 3.5–5.0)
Alkaline Phosphatase: 184 U/L — ABNORMAL HIGH (ref 38–126)
Anion gap: 10 (ref 5–15)
BUN: 29 mg/dL — ABNORMAL HIGH (ref 8–23)
CO2: 27 mmol/L (ref 22–32)
Calcium: 8.8 mg/dL — ABNORMAL LOW (ref 8.9–10.3)
Chloride: 107 mmol/L (ref 98–111)
Creatinine, Ser: 0.95 mg/dL (ref 0.61–1.24)
GFR calc Af Amer: >60 mL/min (ref >60)
GFR calc non Af Amer: >60 mL/min (ref >60)
Glucose, Bld: 168 mg/dL — ABNORMAL HIGH (ref 70–99)
Potassium: 4.5 mmol/L (ref 3.5–5.1)
Sodium: 144 mmol/L (ref 135–145)
Total Bilirubin: 0.5 mg/dL (ref 0.3–1.2)
Total Protein: 6.2 g/dL — ABNORMAL LOW (ref 6.5–8.1)

## 2019-06-12 LAB — C-REACTIVE PROTEIN: CRP: 6.7 mg/dL — ABNORMAL HIGH (ref <1.0)

## 2019-06-12 LAB — MAGNESIUM: Magnesium: 2.1 mg/dL (ref 1.7–2.4)

## 2019-06-12 MED ORDER — DOCUSATE SODIUM 100 MG PO CAPS: 100.0000 mg | ORAL_CAPSULE | Freq: Two times a day (BID) | ORAL | Status: DC | PRN

## 2019-06-12 NOTE — Plan of Care (Signed)
  Problem: Respiratory: Goal: Complications related to the disease process, condition or treatment will be avoided or minimized Outcome: Progressing Note: Patient continues on HFNC at 8L throughout shift. Oxygen remain stable at 90-97% while in prone position asleep. Oxygen decreases with movement. No distress noted, will continue to monitor.    Problem: Respiratory: Goal: Will maintain a patent airway Outcome: Progressing

## 2019-06-12 NOTE — Progress Notes (Signed)
Patients oxygen requirements without change throughout night. Oxygen saturation 90-91% on HFNC 8 L. Nurse reports he does experience some decrease in oxygen saturation with activity but he does not become symptomatic and respirations remain non labored.  Nurse als0 reports patient self prone during night.

## 2019-06-12 NOTE — Progress Notes (Signed)
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 Caucasian male with medical history significant of HTN, HLD, DM2, CAD, h/o bronchus and lung carcinoid tumor, liver cancer in remission, PAF not on AC, BPH, COVID-19 positive on 1/28, presented to ED with cough for 10 days, SOB for a few days.    ED Course: pt was afebrile, O2 sat > 96% on 4L.  Labs no leukocytosis.  CXR bilateral opacity.  D-dimer and CRP elevated.  Patient was given dexamethasone and admitted with COVID-19 pneumonia with hypoxia.   Pneumonia due to COVID-19 virus Acute respiratory failure with hypoxia  COVID19 test positive on 1/28  chest x-ray showed bilateral opacity D dimer elevated, Chest CTA no PE, showed groundglass opacity --requiring 4 L nasal cannula oxygen on presentation, increased to 8L HighFlow the day after admission, though in no distress. PLAN: --continuous pulse Ox monitoring --Dexamethasone 6 mg IV daily --Remdesivir IV --continue combivent QID --If O2 requirement reaches 10L, will initiate transfer to stepdown  Elevated LFTs Likely due to COVID-19 infection  Endobronchial mass 2cm Seen on CT Consider recurrence of carcinoid tumor treated by Dr. Grayland Ormond for stage IV lung cancer, receiving injections monthly oflanreotide, will need follow up  HTN, HLD, CAD, PAF not on AC, BPH  Continue with home  medications  DM2 SSI   DVT prophylaxis: Lovenox SQ Code Status: Full code  Family Communication: not today Disposition Plan: Home.  Will likely be more than 5 days since O2 requirement is high at 10L.   Subjective and Interval History:  Pt's O2 requirement remained stable at 8L overnight.  Pt this morning reported some cough, but overall comfortable.  No fever, chest pain, abdominal pain, N/V/D, dysuria, increased swelling.  Normal PO intake.    Objective: Vitals:   06/11/19 2300 06/12/19 0035 06/12/19 0917 06/12/19 1617  BP:  (!) 114/56 126/62 (!) 132/51  Pulse: 63 64 67 70  Resp: 15 18 18  (!) 24  Temp:  99.2 F (37.3 C) 98 F (36.7 C) 98.4 F (36.9 C)  TempSrc:  Oral Oral Oral  SpO2: 97% 95% (!) 87% 96%  Weight:      Height:        Intake/Output Summary (Last 24 hours) at 06/12/2019 1652 Last data filed at 06/12/2019 0900 Gross per 24 hour  Intake 100 ml  Output 700 ml  Net -600 ml   Filed Weights   06/10/19 2108  Weight: 65.8 kg    Examination:   Constitutional: NAD, AAOx3 HEENT: conjunctivae and lids normal, EOMI CV: RRR. Distal pulses +2.  No cyanosis.   RESP: decreased lung sounds, normal respiratory effort, on 8L GI: +BS, NTND Extremities: No effusions, edema, or tenderness in BLE SKIN: warm, dry and intact Neuro: II - XII grossly intact.  Sensation intact Psych: Normal mood and affect.  Appropriate judgement and reason   Data Reviewed: I have personally reviewed following labs and imaging studies  CBC: Recent Labs  Lab 06/07/19 1100 06/10/19 2112 06/11/19 0718 06/12/19 0444  WBC 7.4 7.3 5.4 13.5*  NEUTROABS 5.5 5.5 4.6 11.0*  HGB 14.6 16.4 14.0 14.2  HCT 43.4 47.3 41.7 42.7  MCV 88.6 89.4 90.3 89.9  PLT 143* 185 186 494   Basic Metabolic Panel: Recent Labs  Lab 06/07/19 1100 06/10/19 2112 06/11/19 0718 06/12/19 0444  NA 134* 139 138 144  K 3.4* 3.6 4.2 4.5  CL 99 101 105 107  CO2 25 26 23 27   GLUCOSE 119* 138* 195* 168*  BUN  18 17 18  29*  CREATININE 0.89 0.92 0.77 0.95  CALCIUM 8.5* 9.0 8.2* 8.8*  MG  --   --  1.9 2.1   GFR: Estimated Creatinine Clearance: 55.4 mL/min (by C-G formula based on SCr of 0.95 mg/dL). Liver Function Tests: Recent Labs  Lab 06/10/19 2112 06/11/19 0718 06/12/19 0444  AST 74* 60* 61*  ALT 69* 54* 57*  ALKPHOS 247* 198* 184*  BILITOT 0.6 0.4 0.5  PROT 7.1 6.2* 6.2*  ALBUMIN 3.6 3.0* 3.1*   No results for input(s): LIPASE, AMYLASE in the last 168 hours. No results for input(s): AMMONIA in the last 168 hours. Coagulation Profile: No results for input(s): INR, PROTIME in the last 168 hours. Cardiac Enzymes: No results for input(s): CKTOTAL, CKMB, CKMBINDEX, TROPONINI in the last 168 hours. BNP (last 3 results) No results for input(s): PROBNP in the last 8760 hours. HbA1C: No results for input(s): HGBA1C in the last 72 hours. CBG: No results for input(s): GLUCAP in the last 168 hours. Lipid Profile: Recent Labs    06/10/19 2112  TRIG 139   Thyroid Function Tests: No results for input(s): TSH, T4TOTAL, FREET4, T3FREE, THYROIDAB in the last 72 hours. Anemia Panel: Recent Labs    06/10/19 2112 06/11/19 0718  FERRITIN 1,342* 1,089*   Sepsis Labs: Recent Labs  Lab 06/10/19 2112 06/10/19 2319  PROCALCITON 0.11  --   LATICACIDVEN 1.6 1.3    Recent Results (from the past 240 hour(s))  Novel Coronavirus, NAA (Hosp order, Send-out to Ref Lab; TAT 18-24 hrs     Status: Abnormal   Collection Time: 06/07/19 10:19 AM   Specimen: Nasopharyngeal Swab; Respiratory  Result Value Ref Range Status   SARS-CoV-2, NAA DETECTED (A) NOT DETECTED Final    Comment: [place] at [facility] notified at 1351 06/08/2019 by [place]. (NOTE)                  Client Requested Flag This nucleic acid amplification test was developed and its performance characteristics determined by [employer]. Nucleic acid amplification tests include RT-PCR and TMA. This test has not been FDA cleared or  approved. This test has been authorized by FDA under an Emergency Use Authorization (EUA). This test is only authorized for the duration of time the declaration that circumstances exist justifying the authorization of the emergency use of in vitro diagnostic tests for detection of SARS-CoV-2 virus and/or diagnosis of COVID-19 infection under section 564(b)(1) of the Act, 21 U.S.C. 496PRF-1(M) (1), unless the authorization is terminated or revoked sooner. When diagnostic testing is negative, the possibility of a false negative result should be considered in the context of a patient's recent exposures and the presence of clinical signs and symptoms consistent with  COVID-19. An individual without symptoms of COVID- 19 and who is not shedding SARS-CoV-2 virus would  expect to have a negative (not detected) result in this assay. Performed At: [facility] [place], [place] [id number] Rush Farmer MD EH:[id number] Performed at [facility], [address] [place], [place] [address]    Coronavirus Source NASOPHARYNGEAL  Final    Comment: Performed at [facility], [address], [place], [place] [address]  Blood Culture (routine x 2)     Status: None (Preliminary result)   Collection Time: 06/10/19  9:12 PM   Specimen: BLOOD  Result Value Ref Range Status   Specimen Description BLOOD BLOOD RIGHT WRIST  Final   Special Requests   Final    BOTTLES DRAWN AEROBIC AND ANAEROBIC Blood Culture adequate volume   Culture   Final    NO GROWTH 2 DAYS Performed at [facility], [address], [place], [place] [address]    Report Status PENDING  Incomplete  Blood Culture (routine x 2)     Status: None (Preliminary result)   Collection Time: 06/10/19  9:12 PM   Specimen: BLOOD  Result Value Ref Range Status   Specimen Description BLOOD LEFT ANTECUBITAL  Final   Special Requests   Final    BOTTLES DRAWN AEROBIC AND ANAEROBIC Blood Culture  adequate volume   Culture   Final    NO GROWTH 2 DAYS Performed at [facility], [address] [place],  [address]    Report Status PENDING  Incomplete      Radiology Studies: CT ANGIO CHEST PE W OR WO CONTRAST  Result Date: 06/11/2019 CLINICAL DATA:  Shortness of breath.  COVID positive. EXAM: CT ANGIOGRAPHY CHEST WITH CONTRAST TECHNIQUE: Multidetector CT imaging of the chest was performed using the standard protocol during bolus administration of intravenous contrast. Multiplanar CT image reconstructions and MIPs were obtained to evaluate the vascular anatomy. CONTRAST:  55mL OMNIPAQUE IOHEXOL 350 MG/ML SOLN COMPARISON:  06/27/2016 FINDINGS: Cardiovascular: Normal heart size. No pericardial effusion. Atherosclerotic calcification of the aorta and coronaries. No pulmonary artery filling defect Mediastinum/Nodes: Negative for adenopathy or mass. Lungs/Pleura: Left upper lobectomy for bronchial carcinoid per chart. There is ground-glass opacity with a subpleural predilection in the right more than left lung consistent with history of COVID 19 positivity. Also, there is a irregular lobulated endobronchial mass at the level of the distal left mainstem bronchus, 2 cm in length. Bronchus appears somewhat thin along the esophageal margin but no clear esophageal fistulization. Upper Abdomen: Cholelithiasis. History of treated liver metastases, reference MRI 03/15/2019. Musculoskeletal: Spondylosis. Rounded sclerotic focus in the T11 vertebral body is stable from 2019. Review of the MIP images confirms the above findings. IMPRESSION: 1. Bilateral ground-glass pneumonia consistent with COVID-19 positivity. 2. 2 cm left main endobronchial mass close to a left upper lobectomy margin, presumably local recurrence of bronchial carcinoid. Electronically Signed   By: Monte Fantasia M.D.   On: 06/11/2019 04:28   DG Chest Port 1 View  Result Date: 06/10/2019 CLINICAL DATA:  Fever.  COVID-19  pneumonia. EXAM: PORTABLE CHEST 1 VIEW COMPARISON:  June 07, 2019 FINDINGS: Been significant interval development diffuse airspace opacities throughout the right lung field with focal areas of consolidation involving the right lower lobe. There is an airspace opacity at the left lung base. There is no pneumothorax. No large pleural effusion. Aortic calcifications are noted. The heart size is normal. IMPRESSION: Significant interval development of bilateral airspace opacities, greatest on the right, concerning for multifocal pneumonia (viral or bacterial). Electronically Signed   By: Harrell Gave  Green M.D.   On: 06/10/2019 21:19     Scheduled Meds: . amLODipine  2.5 mg Oral Daily  . vitamin C  500 mg Oral Daily  . atorvastatin  40 mg Oral QHS  . calcium-vitamin D  1 tablet Oral Daily  . dexamethasone  6 mg Oral Daily  . enoxaparin (LOVENOX) injection  40 mg Subcutaneous Q24H  . finasteride  5 mg Oral Daily  . Ipratropium-Albuterol  1 puff Inhalation QID  . loratadine  10 mg Oral Daily  . losartan  100 mg Oral Daily  . multivitamin  1 tablet Oral Daily  . multivitamin with minerals  1 tablet Oral Daily  . polycarbophil  625 mg Oral BID  . tamsulosin  0.4 mg Oral QHS   Continuous Infusions: . remdesivir 100 mg in NS 100 mL 100 mg (06/12/19 0928)     LOS: 2 days     Enzo Bi, MD [facility] If 7PM-7AM, please contact night-coverage 06/12/2019, 4:52 PM

## 2019-06-12 NOTE — Progress Notes (Addendum)
MEWS yellow. New change. Made Dr. Billie Ruddy aware of same. Will continue to closely monitor patient and vs. New orders HFNC at 8 L.

## 2019-06-13 LAB — CBC WITH DIFFERENTIAL/PLATELET
Abs Immature Granulocytes: 0.21 10*3/uL — ABNORMAL HIGH (ref 0.00–0.07)
Basophils Absolute: 0 10*3/uL (ref 0.0–0.1)
Basophils Relative: 0 %
Eosinophils Absolute: 0 10*3/uL (ref 0.0–0.5)
Eosinophils Relative: 0 %
HCT: 42.2 % (ref 39.0–52.0)
Hemoglobin: 14.1 g/dL (ref 13.0–17.0)
Immature Granulocytes: 1 %
Lymphocytes Relative: 3 %
Lymphs Abs: 0.6 10*3/uL — ABNORMAL LOW (ref 0.7–4.0)
MCH: 30.1 pg (ref 26.0–34.0)
MCHC: 33.4 g/dL (ref 30.0–36.0)
MCV: 90.2 fL (ref 80.0–100.0)
Monocytes Absolute: 1.7 10*3/uL — ABNORMAL HIGH (ref 0.1–1.0)
Monocytes Relative: 8 %
Neutro Abs: 17.8 10*3/uL — ABNORMAL HIGH (ref 1.7–7.7)
Neutrophils Relative %: 88 %
Platelets: 225 10*3/uL (ref 150–400)
RBC: 4.68 MIL/uL (ref 4.22–5.81)
RDW: 14.9 % (ref 11.5–15.5)
WBC: 20.4 10*3/uL — ABNORMAL HIGH (ref 4.0–10.5)
nRBC: 0 % (ref 0.0–0.2)

## 2019-06-13 LAB — COMPREHENSIVE METABOLIC PANEL
ALT: 57 U/L — ABNORMAL HIGH (ref 0–44)
AST: 58 U/L — ABNORMAL HIGH (ref 15–41)
Albumin: 3 g/dL — ABNORMAL LOW (ref 3.5–5.0)
Alkaline Phosphatase: 162 U/L — ABNORMAL HIGH (ref 38–126)
Anion gap: 10 (ref 5–15)
BUN: 34 mg/dL — ABNORMAL HIGH (ref 8–23)
CO2: 26 mmol/L (ref 22–32)
Calcium: 8.6 mg/dL — ABNORMAL LOW (ref 8.9–10.3)
Chloride: 107 mmol/L (ref 98–111)
Creatinine, Ser: 0.9 mg/dL (ref 0.61–1.24)
GFR calc Af Amer: >60 mL/min (ref >60)
GFR calc non Af Amer: >60 mL/min (ref >60)
Glucose, Bld: 175 mg/dL — ABNORMAL HIGH (ref 70–99)
Potassium: 4.5 mmol/L (ref 3.5–5.1)
Sodium: 143 mmol/L (ref 135–145)
Total Bilirubin: 0.5 mg/dL (ref 0.3–1.2)
Total Protein: 5.8 g/dL — ABNORMAL LOW (ref 6.5–8.1)

## 2019-06-13 LAB — C-REACTIVE PROTEIN: CRP: 3.3 mg/dL — ABNORMAL HIGH (ref <1.0)

## 2019-06-13 LAB — MAGNESIUM: Magnesium: 2.1 mg/dL (ref 1.7–2.4)

## 2019-06-13 MED ORDER — POLYETHYLENE GLYCOL 3350 17 G PO PACK
17.0000 g | PACK | Freq: Two times a day (BID) | ORAL | Status: DC
  Administered 2019-06-13 (×2): 17 g via ORAL
  Filled 2019-06-13: qty 1

## 2019-06-13 NOTE — Care Management Important Message (Signed)
Important Message  Patient Details  Name: Jason Malone MRN: [id number] Date of Birth: 1940/09/12   Medicare Important Message Given:  N/A - LOS <3 / Initial given by admissions     Juliann Pulse A Kayse Puccini 06/13/2019, 8:08 AM

## 2019-06-13 NOTE — Progress Notes (Addendum)
Paged E. Ouma, NP concerning pt requiring 15L HFNC to maintain SpO2>92%. Also awaiting callback from [facility] concerning availability of mask/ alternative oxygen therapy, considering pt is a "mouth breather" while sleeping.

## 2019-06-13 NOTE — Progress Notes (Signed)
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 Caucasian male with medical history significant of HTN, HLD, DM2, CAD, h/o bronchus and lung carcinoid tumor, liver cancer in remission, PAF not on AC, BPH, COVID-19 positive on 1/28, presented to ED with cough for 10 days, SOB for a few days.    ED Course: pt was afebrile, O2 sat > 96% on 4L.  Labs no leukocytosis.  CXR bilateral opacity.  D-dimer and CRP elevated.  Patient was given dexamethasone and admitted with COVID-19 pneumonia with hypoxia.   Pneumonia due to COVID-19 virus Acute respiratory failure with hypoxia  COVID19 test positive on 1/28  chest x-ray showed bilateral opacity D dimer elevated, Chest CTA no PE, showed groundglass opacity --requiring 4 L nasal cannula oxygen on presentation, increased to 8L HighFlow the day after admission, though in no distress. PLAN: --continuous pulse Ox monitoring --Dexamethasone 6 mg IV daily --Remdesivir IV --continue combivent QID --If O2 requirement reaches 10L, will initiate transfer to stepdown  Elevated LFTs Likely due to COVID-19 infection  Endobronchial mass 2cm Seen on CT Consider recurrence of carcinoid tumor treated by Dr. Grayland Ormond for stage IV lung cancer, receiving injections monthly oflanreotide, will need follow up  HTN, HLD, CAD, PAF not on AC, BPH  Continue with home  medications  DM2 SSI   DVT prophylaxis: Lovenox SQ Code Status: Full code  Family Communication: not today Disposition Plan: Home.  Will likely be more than 5 days since O2 requirement is high at 10L.   Subjective and Interval History:  Pt reported coughing with some sputum production.  Hasn't had a BM.  Otherwise no complaints.  No fever, chest pain, abdominal pain, N/V/D, dysuria, increased swelling.   Objective: Vitals:   06/12/19 0917 06/12/19 1617 06/13/19 0000 06/13/19 0740  BP: 126/62 (!) 132/51 (!) 113/43 (!) 135/46  Pulse: 67 70 62 69  Resp: 18 (!) 24 16 16   Temp: 98 F (36.7 C) 98.4 F (36.9 C) 98.4 F (36.9 C)   TempSrc: Oral Oral Oral   SpO2: (!) 87% 96% 97% 94%  Weight:      Height:        Intake/Output Summary (Last 24 hours) at 06/13/2019 1444 Last data filed at 06/13/2019 1442 Gross per 24 hour  Intake 615 ml  Output 750 ml  Net -135 ml   Filed Weights   06/10/19 2108  Weight: 65.8 kg    Examination:   Constitutional: NAD, AAOx3 HEENT: conjunctivae and lids normal, EOMI CV: RRR. Distal pulses +2.  No cyanosis.   RESP: high-pitched bronchial breath sounds, normal respiratory effort, on 8L GI: +BS, NTND Extremities: No effusions, edema, or tenderness in BLE SKIN: warm, dry and intact Neuro: II - XII grossly intact.  Sensation intact Psych: Normal mood and affect.  Appropriate judgement and reason  Data Reviewed: I have personally reviewed following labs and imaging studies  CBC: Recent Labs  Lab 06/07/19 1100 06/10/19 2112 06/11/19 0718 06/12/19 0444 06/13/19 0401  WBC 7.4 7.3 5.4 13.5* 20.4*  NEUTROABS 5.5 5.5 4.6 11.0* 17.8*  HGB 14.6 16.4 14.0 14.2 14.1  HCT 43.4 47.3 41.7 42.7 42.2  MCV 88.6 89.4 90.3 89.9 90.2  PLT 143* 185 186 208 865   Basic Metabolic Panel: Recent Labs  Lab 06/07/19 1100 06/10/19 2112 06/11/19 0718 06/12/19 0444 06/13/19 0401  NA 134* 139 138 144 143  K 3.4* 3.6 4.2 4.5 4.5  CL 99 101 105 107 107   CO2 25 26 23 27 26   GLUCOSE 119* 138* 195* 168* 175*  BUN 18 17 18  29* 34*  CREATININE 0.89 0.92 0.77 0.95 0.90  CALCIUM 8.5* 9.0 8.2* 8.8* 8.6*  MG  --   --  1.9 2.1 2.1   GFR: Estimated Creatinine Clearance: 58.5 mL/min (by C-G formula based on SCr of 0.9 mg/dL). Liver Function Tests: Recent Labs  Lab 06/10/19 2112 06/11/19 0718 06/12/19 0444 06/13/19 0401  AST 74* 60* 61* 58*  ALT 69* 54* 57* 57*  ALKPHOS 247* 198* 184* 162*  BILITOT 0.6 0.4 0.5 0.5  PROT 7.1 6.2* 6.2* 5.8*  ALBUMIN 3.6 3.0* 3.1* 3.0*   No results for input(s): LIPASE, AMYLASE in the last 168 hours. No results for input(s): AMMONIA in the last 168 hours. Coagulation Profile: No results for input(s): INR, PROTIME in the last 168 hours. Cardiac Enzymes: No results for input(s): CKTOTAL, CKMB, CKMBINDEX, TROPONINI in the last 168 hours. BNP (last 3 results) No results for input(s): PROBNP in the last 8760 hours. HbA1C: No results for input(s): HGBA1C in the last 72 hours. CBG: No results for input(s): GLUCAP in the last 168 hours. Lipid Profile: Recent Labs    06/10/19 2112  TRIG 139   Thyroid Function Tests: No results for input(s): TSH, T4TOTAL, FREET4, T3FREE, THYROIDAB in the last 72 hours. Anemia Panel: Recent Labs    06/10/19 2112 06/11/19 0718  FERRITIN 1,342* 1,089*   Sepsis Labs: Recent Labs  Lab 06/10/19 2112 06/10/19 2319  PROCALCITON 0.11  --   LATICACIDVEN 1.6 1.3    Recent Results (from the past 240 hour(s))  Novel Coronavirus, NAA (Hosp order, Send-out to Ref Lab; TAT 18-24 hrs     Status: Abnormal   Collection Time: 06/07/19 10:19 AM   Specimen: Nasopharyngeal Swab; Respiratory  Result Value Ref Range Status   SARS-CoV-2, NAA DETECTED (A) NOT DETECTED Final    Comment: [place] at [facility] notified at 1351 06/08/2019 by [place]. (NOTE)                  Client Requested Flag This nucleic acid amplification test was developed and its performance characteristics determined by  [employer]. Nucleic acid amplification tests include RT-PCR and TMA. This test has not been FDA cleared or approved. This test has been authorized by FDA under an Emergency Use Authorization (EUA). This test is only authorized for the duration of time the declaration that circumstances exist justifying the authorization of the emergency use of in vitro diagnostic tests for detection of SARS-CoV-2 virus and/or diagnosis of COVID-19 infection under section 564(b)(1) of the Act, 21 U.S.C. 784ONG-2(X) (1), unless the authorization is terminated or revoked sooner. When diagnostic testing is negative, the possibility of a false negative result should be considered in the context of a patient's recent exposures and the presence of clinical signs  and symptoms consistent with  COVID-19. An individual without symptoms of COVID- 19 and who is not shedding SARS-CoV-2 virus would expect to have a negative (not detected) result in this assay. Performed At: [facility] [place], [place] [id number] Rush Farmer MD [id number] Performed at [facility], [address] [place], [place] [address]    Coronavirus Source NASOPHARYNGEAL  Final    Comment: Performed at [facility], [address], [place], [place] [address]  Blood Culture (routine x 2)     Status: None (Preliminary result)   Collection Time: 06/10/19  9:12 PM   Specimen: BLOOD  Result Value Ref Range Status   Specimen Description BLOOD BLOOD RIGHT WRIST  Final   Special Requests   Final    BOTTLES DRAWN AEROBIC AND ANAEROBIC Blood Culture adequate volume   Culture   Final    NO GROWTH 3 DAYS Performed at [facility], [address], [place], [place] [address]    Report Status PENDING  Incomplete  Blood Culture (routine x 2)     Status: None (Preliminary result)   Collection Time: 06/10/19  9:12 PM   Specimen: BLOOD  Result Value Ref Range Status   Specimen  Description BLOOD LEFT ANTECUBITAL  Final   Special Requests   Final    BOTTLES DRAWN AEROBIC AND ANAEROBIC Blood Culture adequate volume   Culture   Final    NO GROWTH 3 DAYS Performed at [facility], [address] [place], [place] [address]    Report Status PENDING  Incomplete      Radiology Studies: No results found.   Scheduled Meds: . vitamin C  500 mg Oral Daily  . atorvastatin  40 mg Oral QHS  . calcium-vitamin D  1 tablet Oral Daily  . dexamethasone  6 mg Oral Daily  . enoxaparin (LOVENOX) injection  40 mg Subcutaneous Q24H  . finasteride  5 mg Oral Daily  . Ipratropium-Albuterol  1 puff Inhalation QID  . loratadine  10 mg Oral Daily  . losartan  100 mg Oral Daily  . multivitamin  1 tablet Oral Daily  . multivitamin with minerals  1 tablet Oral Daily  . polycarbophil  625 mg Oral BID  . polyethylene glycol  17 g Oral BID  . tamsulosin  0.4 mg Oral QHS   Continuous Infusions: . remdesivir 100 mg in NS 100 mL 100 mg (06/13/19 1009)     LOS: 3 days     Enzo Bi, MD [facility] If 7PM-7AM, please contact night-coverage 06/13/2019, 2:44 PM

## 2019-06-13 NOTE — Progress Notes (Addendum)
Paged E. Ouma, NP concerning Pt Sp02 86-92% on 8L High flow @1915 . Have increased to 12L to maintain SpO2 >92%. BP 167/74. Pt is otherwise stable, sitting up at edge of bed with no complaints.

## 2019-06-13 NOTE — Progress Notes (Signed)
Patients BP is 136/44 map of 74 he has a losartan due. Messaged Dr. Billie Ruddy, she said to give the Losartan and she discontinued the Amlodipine.

## 2019-06-14 ENCOUNTER — Encounter (HOSPITAL_COMMUNITY): Payer: Self-pay | Admitting: Internal Medicine

## 2019-06-14 ENCOUNTER — Other Ambulatory Visit: Payer: Self-pay

## 2019-06-14 ENCOUNTER — Inpatient Hospital Stay (HOSPITAL_COMMUNITY)
Admission: AD | Admit: 2019-06-14 | Discharge: 2019-06-17 | DRG: 177 | Disposition: A | Discharge status: Home Health | Payer: Medicare Other | Source: Other Acute Inpatient Hospital | Attending: Internal Medicine | Admitting: Internal Medicine

## 2019-06-14 DIAGNOSIS — Z79899 Other long term (current) drug therapy: Secondary | ICD-10-CM | POA: Diagnosis not present

## 2019-06-14 DIAGNOSIS — E119 Type 2 diabetes mellitus without complications: Secondary | ICD-10-CM | POA: Diagnosis present

## 2019-06-14 DIAGNOSIS — I10 Essential (primary) hypertension: Secondary | ICD-10-CM | POA: Diagnosis present

## 2019-06-14 DIAGNOSIS — I2510 Atherosclerotic heart disease of native coronary artery without angina pectoris: Secondary | ICD-10-CM | POA: Diagnosis not present

## 2019-06-14 DIAGNOSIS — E118 Type 2 diabetes mellitus with unspecified complications: Secondary | ICD-10-CM | POA: Diagnosis not present

## 2019-06-14 DIAGNOSIS — N401 Enlarged prostate with lower urinary tract symptoms: Secondary | ICD-10-CM | POA: Diagnosis present

## 2019-06-14 DIAGNOSIS — J9690 Respiratory failure, unspecified, unspecified whether with hypoxia or hypercapnia: Secondary | ICD-10-CM | POA: Diagnosis present

## 2019-06-14 DIAGNOSIS — R160 Hepatomegaly, not elsewhere classified: Secondary | ICD-10-CM | POA: Diagnosis present

## 2019-06-14 DIAGNOSIS — N400 Enlarged prostate without lower urinary tract symptoms: Secondary | ICD-10-CM | POA: Diagnosis not present

## 2019-06-14 DIAGNOSIS — Z85118 Personal history of other malignant neoplasm of bronchus and lung: Secondary | ICD-10-CM

## 2019-06-14 DIAGNOSIS — Z87891 Personal history of nicotine dependence: Secondary | ICD-10-CM | POA: Diagnosis not present

## 2019-06-14 DIAGNOSIS — I4892 Unspecified atrial flutter: Secondary | ICD-10-CM | POA: Diagnosis present

## 2019-06-14 DIAGNOSIS — I4891 Unspecified atrial fibrillation: Secondary | ICD-10-CM | POA: Diagnosis present

## 2019-06-14 DIAGNOSIS — C787 Secondary malignant neoplasm of liver and intrahepatic bile duct: Secondary | ICD-10-CM | POA: Diagnosis present

## 2019-06-14 DIAGNOSIS — C7A8 Other malignant neuroendocrine tumors: Secondary | ICD-10-CM | POA: Diagnosis present

## 2019-06-14 DIAGNOSIS — E7800 Pure hypercholesterolemia, unspecified: Secondary | ICD-10-CM

## 2019-06-14 DIAGNOSIS — Z85110 Personal history of malignant carcinoid tumor of bronchus and lung: Secondary | ICD-10-CM

## 2019-06-14 DIAGNOSIS — U071 COVID-19: Secondary | ICD-10-CM | POA: Diagnosis present

## 2019-06-14 DIAGNOSIS — J1282 Pneumonia due to coronavirus disease 2019: Secondary | ICD-10-CM | POA: Diagnosis present

## 2019-06-14 DIAGNOSIS — Z902 Acquired absence of lung [part of]: Secondary | ICD-10-CM

## 2019-06-14 DIAGNOSIS — E785 Hyperlipidemia, unspecified: Secondary | ICD-10-CM | POA: Diagnosis present

## 2019-06-14 DIAGNOSIS — Z9221 Personal history of antineoplastic chemotherapy: Secondary | ICD-10-CM | POA: Diagnosis not present

## 2019-06-14 DIAGNOSIS — J9601 Acute respiratory failure with hypoxia: Secondary | ICD-10-CM | POA: Diagnosis present

## 2019-06-14 DIAGNOSIS — J398 Other specified diseases of upper respiratory tract: Secondary | ICD-10-CM | POA: Diagnosis present

## 2019-06-14 LAB — COMPREHENSIVE METABOLIC PANEL
ALT: 73 U/L — ABNORMAL HIGH (ref 0–44)
ALT: 75 U/L — ABNORMAL HIGH (ref 0–44)
AST: 62 U/L — ABNORMAL HIGH (ref 15–41)
AST: 59 U/L — ABNORMAL HIGH (ref 15–41)
Albumin: 3 g/dL — ABNORMAL LOW (ref 3.5–5.0)
Albumin: 3.2 g/dL — ABNORMAL LOW (ref 3.5–5.0)
Alkaline Phosphatase: 171 U/L — ABNORMAL HIGH (ref 38–126)
Alkaline Phosphatase: 182 U/L — ABNORMAL HIGH (ref 38–126)
Anion gap: 10 (ref 5–15)
Anion gap: 14 (ref 5–15)
BUN: 29 mg/dL — ABNORMAL HIGH (ref 8–23)
BUN: 30 mg/dL — ABNORMAL HIGH (ref 8–23)
CO2: 27 mmol/L (ref 22–32)
CO2: 26 mmol/L (ref 22–32)
Calcium: 8.8 mg/dL — ABNORMAL LOW (ref 8.9–10.3)
Calcium: 8.8 mg/dL — ABNORMAL LOW (ref 8.9–10.3)
Chloride: 104 mmol/L (ref 98–111)
Chloride: 103 mmol/L (ref 98–111)
Creatinine, Ser: 0.79 mg/dL (ref 0.61–1.24)
Creatinine, Ser: 0.86 mg/dL (ref 0.61–1.24)
GFR calc Af Amer: >60 mL/min (ref >60)
GFR calc Af Amer: >60 mL/min (ref >60)
GFR calc non Af Amer: >60 mL/min (ref >60)
GFR calc non Af Amer: >60 mL/min (ref >60)
Glucose, Bld: 175 mg/dL — ABNORMAL HIGH (ref 70–99)
Glucose, Bld: 104 mg/dL — ABNORMAL HIGH (ref 70–99)
Potassium: 4.5 mmol/L (ref 3.5–5.1)
Potassium: 4 mmol/L (ref 3.5–5.1)
Sodium: 141 mmol/L (ref 135–145)
Sodium: 143 mmol/L (ref 135–145)
Total Bilirubin: 0.9 mg/dL (ref 0.3–1.2)
Total Bilirubin: 1.1 mg/dL (ref 0.3–1.2)
Total Protein: 5.8 g/dL — ABNORMAL LOW (ref 6.5–8.1)
Total Protein: 6.1 g/dL — ABNORMAL LOW (ref 6.5–8.1)

## 2019-06-14 LAB — RESPIRATORY PANEL BY PCR

## 2019-06-14 LAB — CBC WITH DIFFERENTIAL/PLATELET
Abs Immature Granulocytes: 0.55 10*3/uL — ABNORMAL HIGH (ref 0.00–0.07)
Abs Immature Granulocytes: 0.41 10*3/uL — ABNORMAL HIGH (ref 0.00–0.07)
Basophils Absolute: 0.1 10*3/uL (ref 0.0–0.1)
Basophils Absolute: 0.1 10*3/uL (ref 0.0–0.1)
Basophils Relative: 0 %
Basophils Relative: 0 %
Eosinophils Absolute: 0 10*3/uL (ref 0.0–0.5)
Eosinophils Absolute: 0 10*3/uL (ref 0.0–0.5)
Eosinophils Relative: 0 %
Eosinophils Relative: 0 %
HCT: 42 % (ref 39.0–52.0)
HCT: 45.3 % (ref 39.0–52.0)
Hemoglobin: 14 g/dL (ref 13.0–17.0)
Hemoglobin: 15.2 g/dL (ref 13.0–17.0)
Immature Granulocytes: 3 %
Immature Granulocytes: 2 %
Lymphocytes Relative: 4 %
Lymphocytes Relative: 4 %
Lymphs Abs: 0.8 10*3/uL (ref 0.7–4.0)
Lymphs Abs: 0.8 10*3/uL (ref 0.7–4.0)
MCH: 29.9 pg (ref 26.0–34.0)
MCH: 30.3 pg (ref 26.0–34.0)
MCHC: 33.3 g/dL (ref 30.0–36.0)
MCHC: 33.6 g/dL (ref 30.0–36.0)
MCV: 89.6 fL (ref 80.0–100.0)
MCV: 90.2 fL (ref 80.0–100.0)
Monocytes Absolute: 2.4 10*3/uL — ABNORMAL HIGH (ref 0.1–1.0)
Monocytes Absolute: 3.9 10*3/uL — ABNORMAL HIGH (ref 0.1–1.0)
Monocytes Relative: 11 %
Monocytes Relative: 16 %
Neutro Abs: 18.3 10*3/uL — ABNORMAL HIGH (ref 1.7–7.7)
Neutro Abs: 18.8 10*3/uL — ABNORMAL HIGH (ref 1.7–7.7)
Neutrophils Relative %: 82 %
Neutrophils Relative %: 78 %
Platelets: 230 10*3/uL (ref 150–400)
Platelets: 239 10*3/uL (ref 150–400)
RBC: 4.69 MIL/uL (ref 4.22–5.81)
RBC: 5.02 MIL/uL (ref 4.22–5.81)
RDW: 14.6 % (ref 11.5–15.5)
RDW: 14.7 % (ref 11.5–15.5)
WBC: 22.2 10*3/uL — ABNORMAL HIGH (ref 4.0–10.5)
WBC: 24 10*3/uL — ABNORMAL HIGH (ref 4.0–10.5)
nRBC: 0 % (ref 0.0–0.2)
nRBC: 0 % (ref 0.0–0.2)

## 2019-06-14 LAB — GLUCOSE, CAPILLARY
Glucose-Capillary: 105 mg/dL — ABNORMAL HIGH (ref 70–99)
Glucose-Capillary: 248 mg/dL — ABNORMAL HIGH (ref 70–99)
Glucose-Capillary: 200 mg/dL — ABNORMAL HIGH (ref 70–99)
Glucose-Capillary: 154 mg/dL — ABNORMAL HIGH (ref 70–99)

## 2019-06-14 LAB — TROPONIN I (HIGH SENSITIVITY)
Troponin I (High Sensitivity): 7 ng/L (ref <18)
Troponin I (High Sensitivity): 7 ng/L (ref <18)

## 2019-06-14 LAB — ABO/RH: ABO/RH(D): A POS

## 2019-06-14 LAB — PHOSPHORUS: Phosphorus: 2.6 mg/dL (ref 2.5–4.6)

## 2019-06-14 LAB — HEMOGLOBIN A1C
Hgb A1c MFr Bld: 6.8 % — ABNORMAL HIGH (ref 4.8–5.6)
Mean Plasma Glucose: 148.46 mg/dL

## 2019-06-14 LAB — C-REACTIVE PROTEIN
CRP: 1.7 mg/dL — ABNORMAL HIGH (ref <1.0)
CRP: 2.4 mg/dL — ABNORMAL HIGH (ref <1.0)

## 2019-06-14 LAB — TYPE AND SCREEN
ABO/RH(D): A POS
Antibody Screen: NEGATIVE

## 2019-06-14 LAB — HEPATITIS B SURFACE ANTIGEN: Hepatitis B Surface Ag: NONREACTIVE

## 2019-06-14 LAB — PROCALCITONIN: Procalcitonin: <0.1 ng/mL

## 2019-06-14 LAB — MAGNESIUM
Magnesium: 2 mg/dL (ref 1.7–2.4)
Magnesium: 2.1 mg/dL (ref 1.7–2.4)

## 2019-06-14 LAB — D-DIMER, QUANTITATIVE: D-Dimer, Quant: 2.53 ug/mL-FEU — ABNORMAL HIGH (ref 0.00–0.50)

## 2019-06-14 LAB — FERRITIN: Ferritin: 814 ng/mL — ABNORMAL HIGH (ref 24–336)

## 2019-06-14 MED ORDER — POLYETHYLENE GLYCOL 3350 17 G PO PACK: 17.0000 g | PACK | Freq: Every day | ORAL | Status: DC | PRN

## 2019-06-14 MED ORDER — AMLODIPINE BESYLATE 5 MG PO TABS
2.5000 mg | ORAL_TABLET | Freq: Every day | ORAL | Status: DC
  Administered 2019-06-14 – 2019-06-17 (×4): 2.5 mg via ORAL
  Filled 2019-06-14 (×4): qty 1

## 2019-06-14 MED ORDER — ASPIRIN EC 81 MG PO TBEC
81.0000 mg | DELAYED_RELEASE_TABLET | Freq: Every day | ORAL | Status: DC
  Administered 2019-06-14 – 2019-06-17 (×4): 81 mg via ORAL
  Filled 2019-06-14 (×4): qty 1

## 2019-06-14 MED ORDER — OXYCODONE HCL 5 MG PO TABS: 5.0000 mg | ORAL_TABLET | ORAL | Status: DC | PRN

## 2019-06-14 MED ORDER — IPRATROPIUM-ALBUTEROL 20-100 MCG/ACT IN AERS
1.0000 | INHALATION_SPRAY | Freq: Four times a day (QID) | RESPIRATORY_TRACT | Status: DC
  Administered 2019-06-14 – 2019-06-17 (×12): 1 via RESPIRATORY_TRACT
  Filled 2019-06-14: qty 4

## 2019-06-14 MED ORDER — INSULIN ASPART 100 UNIT/ML ~~LOC~~ SOLN
0.0000 [IU] | SUBCUTANEOUS | Status: DC
  Administered 2019-06-14: 17:00:00 3 [IU] via SUBCUTANEOUS
  Administered 2019-06-14 (×2): 2 [IU] via SUBCUTANEOUS
  Administered 2019-06-15: 09:00:00 1 [IU] via SUBCUTANEOUS
  Administered 2019-06-15 (×2): 2 [IU] via SUBCUTANEOUS
  Administered 2019-06-15 (×2): 3 [IU] via SUBCUTANEOUS
  Administered 2019-06-16: 03:00:00 2 [IU] via SUBCUTANEOUS

## 2019-06-14 MED ORDER — SODIUM CHLORIDE 0.9 % IV SOLN: 200.0000 mg | Freq: Once | INTRAVENOUS | Status: DC

## 2019-06-14 MED ORDER — BISACODYL 5 MG PO TBEC: 5.0000 mg | DELAYED_RELEASE_TABLET | Freq: Every day | ORAL | Status: DC | PRN

## 2019-06-14 MED ORDER — HYDROCOD POLST-CPM POLST ER 10-8 MG/5ML PO SUER
5.0000 mL | Freq: Two times a day (BID) | ORAL | Status: DC | PRN
  Administered 2019-06-14: 5 mL via ORAL
  Filled 2019-06-14: qty 5

## 2019-06-14 MED ORDER — SODIUM CHLORIDE 0.9 % IV SOLN
100.0000 mg | Freq: Every day | INTRAVENOUS | Status: AC
  Administered 2019-06-14: 12:00:00 100 mg via INTRAVENOUS
  Filled 2019-06-14: qty 20

## 2019-06-14 MED ORDER — ASCORBIC ACID 500 MG PO TABS
500.0000 mg | ORAL_TABLET | Freq: Every day | ORAL | Status: DC
  Administered 2019-06-14 – 2019-06-17 (×4): 500 mg via ORAL
  Filled 2019-06-14 (×4): qty 1

## 2019-06-14 MED ORDER — FINASTERIDE 5 MG PO TABS
5.0000 mg | ORAL_TABLET | Freq: Every day | ORAL | Status: DC
  Administered 2019-06-14 – 2019-06-17 (×4): 5 mg via ORAL
  Filled 2019-06-14 (×4): qty 1

## 2019-06-14 MED ORDER — ACETAMINOPHEN 325 MG PO TABS: 650.0000 mg | ORAL_TABLET | Freq: Four times a day (QID) | ORAL | Status: DC | PRN

## 2019-06-14 MED ORDER — SODIUM CHLORIDE 0.9 % IV SOLN: 100.0000 mg | Freq: Every day | INTRAVENOUS | Status: DC

## 2019-06-14 MED ORDER — TOCILIZUMAB 400 MG/20ML IV SOLN
8.0000 mg/kg | Freq: Once | INTRAVENOUS | Status: DC
  Filled 2019-06-14: qty 26.3

## 2019-06-14 MED ORDER — ENOXAPARIN SODIUM 40 MG/0.4ML ~~LOC~~ SOLN
40.0000 mg | SUBCUTANEOUS | Status: DC
  Administered 2019-06-14 – 2019-06-16 (×3): 40 mg via SUBCUTANEOUS
  Filled 2019-06-14 (×3): qty 0.4

## 2019-06-14 MED ORDER — SODIUM CHLORIDE 0.9% IV SOLUTION: Freq: Once | INTRAVENOUS | Status: DC

## 2019-06-14 MED ORDER — TOCILIZUMAB 400 MG/20ML IV SOLN: 8.0000 mg/kg | Freq: Once | INTRAVENOUS | Status: DC

## 2019-06-14 MED ORDER — ZINC SULFATE 220 (50 ZN) MG PO CAPS
220.0000 mg | ORAL_CAPSULE | Freq: Every day | ORAL | Status: DC
  Administered 2019-06-14 – 2019-06-17 (×4): 220 mg via ORAL
  Filled 2019-06-14 (×4): qty 1

## 2019-06-14 MED ORDER — TAMSULOSIN HCL 0.4 MG PO CAPS
0.4000 mg | ORAL_CAPSULE | Freq: Every day | ORAL | Status: DC
  Administered 2019-06-14 – 2019-06-16 (×3): 0.4 mg via ORAL
  Filled 2019-06-14 (×4): qty 1

## 2019-06-14 MED ORDER — GUAIFENESIN-DM 100-10 MG/5ML PO SYRP
10.0000 mL | ORAL_SOLUTION | ORAL | Status: DC | PRN
  Administered 2019-06-16: 09:00:00 10 mL via ORAL
  Filled 2019-06-14: qty 10

## 2019-06-14 MED ORDER — LOSARTAN POTASSIUM 25 MG PO TABS
100.0000 mg | ORAL_TABLET | Freq: Every day | ORAL | Status: DC
  Administered 2019-06-14 – 2019-06-17 (×4): 100 mg via ORAL
  Filled 2019-06-14 (×4): qty 4

## 2019-06-14 MED ORDER — DEXAMETHASONE SODIUM PHOSPHATE 10 MG/ML IJ SOLN
6.0000 mg | INTRAMUSCULAR | Status: DC
  Administered 2019-06-14 – 2019-06-16 (×3): 6 mg via INTRAVENOUS
  Filled 2019-06-14 (×3): qty 1

## 2019-06-14 NOTE — Progress Notes (Signed)
Paged E. Ouma, NP concerning pt only maintaining SpO2 87-93% on Nonrebreather. Pt denies shortness of breath, resting comfortably.

## 2019-06-14 NOTE — Progress Notes (Signed)
Wife [name] called and updated about patient's status. Wife informed that patient was being transferred to [facility]. Wife verbalized understanding.

## 2019-06-14 NOTE — H&P (Addendum)
[facility] History and Physical  OBEDIAH WELLES [id number]:[id number] DOB: April 12, 1941 DOA: 06/14/2019  Referring physician: PCP: Delsa Grana, PA-C   Chief Complaint: S OB  HPI: Jason Malone is a 79 y.o. 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 BPH,   COVID-19 positive on 1/28, presented to ED with cough for 10 days, SOB for a few days. He reports dry cough, subjective fever, chills, headache, fatigue, poor oral intake, stomach upset. His O2 sat down to 84% at home, prompt ED visit. He denies chest pain, palpitation, abdominal pain, diarrhea, dysuria, leg swelling or calf pain. He got his first Covid [employer]) 1/19 on January 14.   States received first vaccination [employer]) 1/19    Review of Systems:  Constitutional:  No weight loss, night sweats, Fevers, chills, fatigue.  HEENT:  No headaches, Difficulty swallowing,Tooth/dental problems,Sore throat,  No sneezing, itching, ear ache, nasal congestion, post nasal drip,  Cardio-vascular:  No chest pain, Orthopnea, PND, swelling in lower extremities, anasarca, dizziness, palpitations  GI:  No heartburn, indigestion, abdominal pain, nausea, vomiting, diarrhea, change in bowel habits, loss of appetite  Resp:  Positive shortness of breath with exertion or at rest. No excess mucus, no productive cough, No non-productive cough, No coughing up of blood.No change in color of mucus.No wheezing.No chest wall deformity  Skin:  no rash or lesions.  GU:  no dysuria, change in color of urine, no urgency or frequency. No flank pain.  Musculoskeletal:  No joint pain or swelling. No decreased range of motion. No back pain.  Psych:  No change in mood or affect. No depression or anxiety. No memory loss.   Past Medical History:  Diagnosis Date  .  Allergic rhinitis   . Benign prostatic hypertrophy with lower urinary tract symptoms (LUTS)   . Chest pain    a. 05/2013 MV: EF 70%, no ischemia.  . Diverticulitis    DIVERTICULOSIS  . Enlarged prostate   . Essential hypertension    CONTROLLED ON MEDS  . Full dentures    upper and lower  . H/O hypokalemia   . Hx of atrial fibrillation without current medication    CARDIOLOGIST-DR [facility]  . 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 Hyperlipemia   . Hypomagnesemia   . IFG (impaired fasting glucose)   . Liver cancer ([place])   . Liver cancer ([employer]) 12/08/2016  . Liver mass, left lobe 08/28/2016   Probably hemangioma; will get MRI of liver, refer to GI  . Lung cancer ([place])    a. carcinoid, left lung, Stage 1b (T2a, N0, cM0);  b. 05/2013 s/p VATS & LULobectomy.  . Monocytosis   . Osteopenia   . Paroxysmal atrial flutter (HCC)    a. 03/2013->no recurrence;  b. CHA2DS2VASc = 2-->not currently on anticoagulation;  c. 05/2012 Echo: EF 55-60%, normal RV.  [name] Wears glasses    Past Surgical History:  Procedure Laterality Date  . BRONCHOSCOPY     04/06/2013  . CARDIOVASCULAR STRESS TEST  05/2013   a. No evidence of ischemia or infarct, EF 70%, no WMAs  . CATARACT EXTRACTION W/PHACO Left 07/14/2015   Procedure: CATARACT EXTRACTION PHACO AND INTRAOCULAR LENS PLACEMENT (IOC);  Surgeon: [name], MD;  Location: [place];  Service: Ophthalmology;  Laterality: Left;  .  CATARACT EXTRACTION W/PHACO Right 10/01/2015   Procedure: CATARACT EXTRACTION PHACO AND INTRAOCULAR LENS PLACEMENT (IOC);  Surgeon: [name], MD;  Location: [place];  Service: Ophthalmology;  Laterality: Right;  . COLONOSCOPY W/ POLYPECTOMY     bleed after-had to go to surgery to stop bleeding via colonoscopy  . COLONOSCOPY WITH PROPOFOL N/A 11/19/2015   Procedure: COLONOSCOPY WITH PROPOFOL;  Surgeon: [name], MD;  Location: [facility] ENDOSCOPY;  Service:  Endoscopy;  Laterality: N/A;  . DUPUYTREN CONTRACTURE RELEASE  12/15/2011   Procedure: DUPUYTREN CONTRACTURE RELEASE;  Surgeon: [name], MD;  Location: [place];  Service: Orthopedics;  Laterality: Left;  Fasciotomy left ring finger dupuytrens  . FASCIECTOMY Right 11/07/2018   Procedure: SEGMENTAL FASCIECTOMY RIGHT RING FINGER;  Surgeon: Daryll Brod, MD;  Location: [place];  Service: Orthopedics;  Laterality: Right;  AXILLARY BLOCK  . IR ANGIOGRAM SELECTIVE EACH ADDITIONAL VESSEL  07/26/2017  . IR ANGIOGRAM SELECTIVE EACH ADDITIONAL VESSEL  07/26/2017  . IR ANGIOGRAM SELECTIVE EACH ADDITIONAL VESSEL  07/26/2017  . IR ANGIOGRAM SELECTIVE EACH ADDITIONAL VESSEL  07/26/2017  . IR ANGIOGRAM SELECTIVE EACH ADDITIONAL VESSEL  07/26/2017  . IR ANGIOGRAM SELECTIVE EACH ADDITIONAL VESSEL  08/11/2017  . IR ANGIOGRAM SELECTIVE EACH ADDITIONAL VESSEL  04/28/2018  . IR ANGIOGRAM VISCERAL SELECTIVE  07/26/2017  . IR ANGIOGRAM VISCERAL SELECTIVE  08/11/2017  . IR ANGIOGRAM VISCERAL SELECTIVE  04/28/2018  . IR EMBO ARTERIAL NOT HEMORR HEMANG INC GUIDE ROADMAPPING  07/26/2017  . IR EMBO TUMOR ORGAN ISCHEMIA INFARCT INC GUIDE ROADMAPPING  08/11/2017  . IR EMBO TUMOR ORGAN ISCHEMIA INFARCT INC GUIDE ROADMAPPING  04/28/2018  . IR RADIOLOGIST EVAL & MGMT  07/07/2017  . IR RADIOLOGIST EVAL & MGMT  09/07/2017  . IR RADIOLOGIST EVAL & MGMT  12/21/2017  . IR RADIOLOGIST EVAL & MGMT  03/22/2018  . IR RADIOLOGIST EVAL & MGMT  06/08/2018  . IR RADIOLOGIST EVAL & MGMT  11/01/2018  . IR RADIOLOGIST EVAL & MGMT  03/21/2019  . IR US GUIDE VASC ACCESS RIGHT  07/26/2017  . IR US GUIDE VASC ACCESS RIGHT  08/11/2017  . IR US GUIDE VASC ACCESS RIGHT  04/28/2018  . LUNG LOBECTOMY  06/10/13   upper left lung  . MULTIPLE TOOTH EXTRACTIONS    . REMOVAL RETAINED LENS Right 11/17/2015   Procedure: REMOVAL RETAINED LENS FROAGMENTS RIGHT EYE;  Surgeon: [name], MD;  Location: [place];   Service: Ophthalmology;  Laterality: Right;  . TONSILLECTOMY    . UPPER GASTROINTESTINAL ENDOSCOPY  11-03-15   Dr [name]  . VASECTOMY    . VIDEO ASSISTED THORACOSCOPY (VATS)/WEDGE RESECTION Left 06/04/2013   Procedure: VIDEO ASSISTED THORACOSCOPY (VATS)/WEDGE RESECTION;  Surgeon: [name], MD;  Location: [place];  Service: Thoracic;  Laterality: Left;  [name] VIDEO BRONCHOSCOPY N/A 06/04/2013   Procedure: VIDEO BRONCHOSCOPY;  Surgeon: [name], MD;  Location: [place];  Service: Thoracic;  Laterality: N/A;   Social History:  reports that he quit smoking about 20 years ago. His smoking use included cigarettes. He has a 40.00 pack-year smoking history. He has never used smokeless tobacco. He reports that he does not drink alcohol or use drugs.  Allergies  Allergen Reactions  . Latex Itching    Tape only- REACTED TO BANDAGE ON ABDOMEN  . Tape Itching    Surgical tapes     Family History  Problem Relation Age of Onset  . Stroke Mother   .  Alzheimer's disease Mother   . Hypertension Sister   . Hyperlipidemia Sister   . Hypertension Brother   . Hyperlipidemia Brother   . Diabetes Brother     Prior to Admission medications   Medication Sig Start Date End Date Taking? Authorizing Provider  amLODipine (NORVASC) 2.5 MG tablet Take 1 tablet by mouth once daily 01/01/19   Minna Merritts, MD  atorvastatin (LIPITOR) 40 MG tablet TAKE 1 TABLET BY MOUTH ONCE DAILY FOR CHOLESTEROL 04/11/19   Delsa Grana, PA-C  b complex vitamins capsule Take 1 capsule by mouth daily. AM    [provider]  Calcium Carbonate-Vitamin D3 (CALCIUM 600/VITAMIN D) 600-400 MG-UNIT TABS Take 1 tablet by mouth daily.     [provider]  Cinnamon 500 MG capsule Take 500 mg by mouth at bedtime. BEDTIME    [provider]  fexofenadine (ALLEGRA) 180 MG tablet Take 180 mg by mouth daily.    [provider]  finasteride (PROSCAR) 5 MG tablet Take 1 tablet (5 mg total) by mouth  daily. 04/11/19   Delsa Grana, PA-C  LANREOTIDE ACETATE [place] Inject 1 Dose into the skin every 30 (thirty) days.    [provider]  losartan (COZAAR) 100 MG tablet Take 1 tablet (100 mg total) by mouth daily. 04/11/19   Delsa Grana, PA-C  Multiple Vitamins-Minerals (MULTIVITAMIN WITH MINERALS) tablet Take 1 tablet by mouth daily. AM    [provider]  polycarbophil (FIBERCON) 625 MG tablet Take 625 mg by mouth 2 (two) times daily. AM    [provider]  polyethylene glycol (MIRALAX / GLYCOLAX) packet Take 17 g by mouth daily as needed.     [provider]  tamsulosin (FLOMAX) 0.4 MG CAPS capsule Take 1 capsule (0.4 mg total) by mouth daily. Patient taking differently: Take 0.4 mg by mouth at bedtime.  04/11/19   Delsa Grana, PA-C  tocilizumab 526 mg in sodium chloride 0.9 % 73.7 mL Inject 526 mg into the vein once for 1 dose. 06/14/19 06/14/19  Enzo Bi, MD  vitamin C (ASCORBIC ACID) 500 MG tablet Take 500 mg by mouth daily. AM    [provider]     Consultants:    Procedures/Significant Events:  1/31 PCXR;-significant interval development of bilateral airspace opacities, greatest on the right, concerning for multifocal pneumonia (viral or bacterial). 2/1 CT chest PE protocol;1. Bilateral ground-glass pneumonia consistent with COVID-19 positivity. 2. 2 cm left main endobronchial mass close to a left upper lobectomy margin, presumably local recurrence of bronchial carcinoid.    I have personally reviewed and interpreted all radiology studies and my findings are as above.   VENTILATOR SETTINGS: NRB 2/4 Flow; 11 L/min FiO2; 100% SPO2; 93%   Cultures 1/28 Novel coronavirus positive 1/31 blood RIGHT wrist NGTD 1/31 LEFT AC NGTD 2/4 respiratory virus panel pending 2/4 blood pending   Antimicrobials: Anti-infectives (From admission, onward)   Start     Dose/Rate Stop   06/15/19 1000  remdesivir 100 mg in sodium chloride 0.9 % 100 mL  IVPB  Status:  Discontinued     100 mg 200 mL/hr over 30 Minutes 06/14/19 1119   06/14/19 1200  remdesivir 100 mg in sodium chloride 0.9 % 100 mL IVPB     100 mg 200 mL/hr over 30 Minutes 06/14/19 1238   06/14/19 1100  remdesivir 200 mg in sodium chloride 0.9% 250 mL IVPB  Status:  Discontinued     200 mg 580 mL/hr over 30 Minutes  06/14/19 1119       Devices    LINES / TUBES:      Continuous Infusions:  Physical Exam: Vitals:   06/14/19 1018 06/14/19 1025  BP: (!) 151/76 (!) 151/76  Pulse: 66 66  Resp: (!) 21 (!) 21  Temp: (!) 97 F (36.1 C) 98 F (36.7 C)  TempSrc: Oral Oral  SpO2: 93% (!) 89%  Weight:  65.8 kg  Height:  5' 4.02" (1.626 m)    Wt Readings from Last 3 Encounters:  06/14/19 65.8 kg  06/10/19 65.8 kg  06/07/19 65.8 kg    General: A/O x4, positive acute respiratory distress Eyes: negative scleral hemorrhage, negative anisocoria, negative icterus ENT: Negative Runny nose, negative gingival bleeding, Neck:  Negative scars, masses, torticollis, lymphadenopathy, JVD Lungs: Decreased breath sounds bilaterally without wheezes or crackles Cardiovascular: Regular rate and rhythm without murmur gallop or rub normal S1 and S2 Abdomen: negative abdominal pain, nondistended, positive soft, bowel sounds, no rebound, no ascites, no appreciable mass Extremities: No significant cyanosis, clubbing, or edema bilateral lower extremities Skin: Negative rashes, lesions, ulcers Psychiatric:  Negative depression, negative anxiety, negative fatigue, negative mania  Central nervous system:  Cranial nerves II through XII intact, tongue/uvula midline, all extremities muscle strength 5/5, sensation intact throughout, negative dysarthria, negative expressive aphasia, negative receptive aphasia.        Labs on Admission:  Basic Metabolic Panel: Recent Labs  Lab 06/11/19 0718 06/12/19 0444 06/13/19 0401 06/14/19 0355 06/14/19 1145  NA 138 144 143 141 143  K 4.2 4.5  4.5 4.5 4.0  CL 105 107 107 104 103  CO2 23 27 26 27 26   GLUCOSE 195* 168* 175* 175* 104*  BUN 18 29* 34* 29* 30*  CREATININE 0.77 0.95 0.90 0.79 0.86  CALCIUM 8.2* 8.8* 8.6* 8.8* 8.8*  MG 1.9 2.1 2.1 2.0 2.1  PHOS  --   --   --   --  2.6   Liver Function Tests: Recent Labs  Lab 06/11/19 0718 06/12/19 0444 06/13/19 0401 06/14/19 0355 06/14/19 1145  AST 60* 61* 58* 62* 59*  ALT 54* 57* 57* 73* 75*  ALKPHOS 198* 184* 162* 171* 182*  BILITOT 0.4 0.5 0.5 0.9 1.1  PROT 6.2* 6.2* 5.8* 5.8* 6.1*  ALBUMIN 3.0* 3.1* 3.0* 3.0* 3.2*   No results for input(s): LIPASE, AMYLASE in the last 168 hours. No results for input(s): AMMONIA in the last 168 hours. CBC: Recent Labs  Lab 06/11/19 0718 06/12/19 0444 06/13/19 0401 06/14/19 0355 06/14/19 1145  WBC 5.4 13.5* 20.4* 22.2* 24.0*  NEUTROABS 4.6 11.0* 17.8* 18.3* 18.8*  HGB 14.0 14.2 14.1 14.0 15.2  HCT 41.7 42.7 42.2 42.0 45.3  MCV 90.3 89.9 90.2 89.6 90.2  PLT 186 208 225 230 239   Cardiac Enzymes: No results for input(s): CKTOTAL, CKMB, CKMBINDEX, TROPONINI in the last 168 hours.  BNP (last 3 results) No results for input(s): BNP in the last 8760 hours.  ProBNP (last 3 results) No results for input(s): PROBNP in the last 8760 hours.  CBG: Recent Labs  Lab 06/14/19 1226  GLUCAP 105*    Radiological Exams on Admission: No results found.  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[place])   Pneumonia due to COVID-19 virus   Acute respiratory failure with hypoxia ([place])   Diabetes mellitus type 2, controlled, with complications ([place])   HLD (hyperlipidemia)   Tracheal mass   Covid pneumonia/acute  respiratory failure with hypoxia COVID-19 Labs  Recent Labs    06/13/19 0401 06/14/19 0355 06/14/19 1145  DDIMER  --   --  2.53*  FERRITIN  --   --  814*  CRP 3.3* 1.7* 2.4*    Lab Results  Component Value Date   SARSCOV2NAA DETECTED (A) 06/07/2019   [place] NEGATIVE 11/03/2018   -States received first vaccination [employer]) 1/19  -Decadron 6 mg daily -Remdesivir per pharmacy protocol -2/4 transfuse 1 unit Covid convalescent plasma -Vitamins per Covid protocol -Combivent -Flutter valve -Incentive spirometry -Titrate O2 to maintain SPO2> 88% -Prone patient 16 hours/day; if cannot tolerate prone 2 to 3 hours per shift   Endotracheal mass (2 cm) -Seen on CTA PE protocol see results above.  Per patient MRI chest 3 months ago did not show mass.  NOTE review oh EMR shows patient had MRI abdomen 03/15/2019 not chest. -Recurrence of carcinoid tumor? -treated by Dr. Grayland Ormond for stage IV lung cancer, receiving injections monthly oflanreotide, will need follow up -2/4 left message on Dr. Delight Hoh cell phone that I had admitted a patient of his and to give me a call back secondary to above new findings.  Essential HTN -Strict in and out -Daily weight -Amlodipine 2.5 mg daily -Losartan 100 mg daily  Paroxysmal atrial flutter  -See HTN -Currently NSR  CAD -See HTN -ASA 81 mg daily  Diabetes type 2 controlled with complication -06/13/5359 hemoglobin A1c= 6.3 -Sensitive SSI  HLD -Lipid panel pending  BPH -Finasteride 5 mg daily -Flomax 0.4 mg daily     Code Status: Full (DVT Prophylaxis: Lovenox Family Communication:  2/4 spoke [name] (wife) counseled on plan of care, answered all questions Disposition Plan: TBD   Data Reviewed: Care during the described time interval was provided by me .  I have reviewed this patient's available data, including medical history, events of note, physical examination, and all test results as part of my evaluation.   The  patient is critically ill with multiple organ systems failure and requires high complexity decision making for assessment and support, frequent evaluation and titration of therapies, application of advanced monitoring technologies and extensive interpretation of multiple databases. Critical Care Time devoted to patient care services described in this note  Time spent: 59 minutes   Najia Hurlbutt, [facility] Pager [phone]

## 2019-06-14 NOTE — Progress Notes (Signed)
Pt requesting to speak to wife on telephone, asked to come off NRB and use HFNC for a few minutes.

## 2019-06-14 NOTE — Care Management Important Message (Signed)
Important Message  Patient Details  Name: Jason Malone MRN: [id number] Date of Birth: [birth year]   Medicare Important Message Given:  Yes RNCM spoke with patient's wife on phone due to his status and inability to talk. Wife verbalizes understanding of important message.      Shelbie Ammons, RN 06/14/2019, 9:10 AM

## 2019-06-14 NOTE — Progress Notes (Signed)
Pt. Arrived at room 147. RN at bedside, vitals and assessement completed. Pt. Stable at arrival. Dr. Sherral Hammers notified. RN spoke with wife on the phone. Questions answered. Will continuo to monitor.

## 2019-06-14 NOTE — Plan of Care (Signed)
  Problem: Education: Goal: Knowledge of risk factors and measures for prevention of condition will improve Outcome: Progressing   Problem: Coping: Goal: Psychosocial and spiritual needs will be supported Outcome: Progressing   Problem: Respiratory: Goal: Will maintain a patent airway Outcome: Progressing Goal: Complications related to the disease process, condition or treatment will be avoided or minimized Outcome: Progressing   

## 2019-06-14 NOTE — Discharge Summary (Signed)
Jason Malone Crossis a 79 y.o.Caucasian malewith medical history significant ofHTN, HLD, DM2, CAD, h/o bronchus and lungcarcinoid tumor, liver cancer in remission, PAF not on AC, BPH,COVID-19 positive on1/28, presented to ED with cough for 10 days, SOB for a few days, D dimer elevated,Chest CTA no PE, showed groundglass opacity.  Initially required4L nasal cannula oxygen on presentation, was on 8 L high flow on 06/12/2018.  Now having increasing O2 requirement, now on 15 L NRB .

## 2019-06-14 NOTE — Care Plan (Addendum)
Request for patient transferred for increasing oxygen requirement.  79 year old with HTN, HLD, DM2, CKD, carcinoid tumor, liver cancer in remission, PAF, BPH who tested positive for SARS-CoV-2 on 06/07/2019, admitted 06/10/2019 with worsening shortness of breath.  Was started on dexamethasone and remdesivir, oxygen requirement is increased from 4 L to nonrebreather, transfer requested for further management.  Given rapid increase in oxygen requirement will transfer patient to the ICU.  Patient is full code.  Discussed with the patient's wife and son, went over the risks and benefits of Actemra and at this time they would want to proceed.  Went over the risk and benefits of convalescent plasma, at this time they would want to proceed as well.

## 2019-06-15 LAB — COMPREHENSIVE METABOLIC PANEL
ALT: 63 U/L — ABNORMAL HIGH (ref 0–44)
AST: 40 U/L (ref 15–41)
Albumin: 3 g/dL — ABNORMAL LOW (ref 3.5–5.0)
Alkaline Phosphatase: 161 U/L — ABNORMAL HIGH (ref 38–126)
Anion gap: 9 (ref 5–15)
BUN: 30 mg/dL — ABNORMAL HIGH (ref 8–23)
CO2: 27 mmol/L (ref 22–32)
Calcium: 8.6 mg/dL — ABNORMAL LOW (ref 8.9–10.3)
Chloride: 106 mmol/L (ref 98–111)
Creatinine, Ser: 0.85 mg/dL (ref 0.61–1.24)
GFR calc Af Amer: >60 mL/min (ref >60)
GFR calc non Af Amer: >60 mL/min (ref >60)
Glucose, Bld: 121 mg/dL — ABNORMAL HIGH (ref 70–99)
Potassium: 4 mmol/L (ref 3.5–5.1)
Sodium: 142 mmol/L (ref 135–145)
Total Bilirubin: 0.5 mg/dL (ref 0.3–1.2)
Total Protein: 5.9 g/dL — ABNORMAL LOW (ref 6.5–8.1)

## 2019-06-15 LAB — BPAM FFP
Blood Product Expiration Date: 202102051412
ISSUE DATE / TIME: 202102041418
Unit Type and Rh: 6200

## 2019-06-15 LAB — PHOSPHORUS: Phosphorus: 3.1 mg/dL (ref 2.5–4.6)

## 2019-06-15 LAB — GLUCOSE, CAPILLARY
Glucose-Capillary: 119 mg/dL — ABNORMAL HIGH (ref 70–99)
Glucose-Capillary: 126 mg/dL — ABNORMAL HIGH (ref 70–99)
Glucose-Capillary: 182 mg/dL — ABNORMAL HIGH (ref 70–99)
Glucose-Capillary: 228 mg/dL — ABNORMAL HIGH (ref 70–99)
Glucose-Capillary: 221 mg/dL — ABNORMAL HIGH (ref 70–99)

## 2019-06-15 LAB — CBC WITH DIFFERENTIAL/PLATELET
Abs Immature Granulocytes: 0.27 10*3/uL — ABNORMAL HIGH (ref 0.00–0.07)
Basophils Absolute: 0 10*3/uL (ref 0.0–0.1)
Basophils Relative: 0 %
Eosinophils Absolute: 0 10*3/uL (ref 0.0–0.5)
Eosinophils Relative: 0 %
HCT: 40.4 % (ref 39.0–52.0)
Hemoglobin: 13.6 g/dL (ref 13.0–17.0)
Immature Granulocytes: 2 %
Lymphocytes Relative: 3 %
Lymphs Abs: 0.5 10*3/uL — ABNORMAL LOW (ref 0.7–4.0)
MCH: 30.4 pg (ref 26.0–34.0)
MCHC: 33.7 g/dL (ref 30.0–36.0)
MCV: 90.2 fL (ref 80.0–100.0)
Monocytes Absolute: 1.7 10*3/uL — ABNORMAL HIGH (ref 0.1–1.0)
Monocytes Relative: 11 %
Neutro Abs: 13.4 10*3/uL — ABNORMAL HIGH (ref 1.7–7.7)
Neutrophils Relative %: 84 %
Platelets: 219 10*3/uL (ref 150–400)
RBC: 4.48 MIL/uL (ref 4.22–5.81)
RDW: 14.4 % (ref 11.5–15.5)
WBC: 15.9 10*3/uL — ABNORMAL HIGH (ref 4.0–10.5)
nRBC: 0 % (ref 0.0–0.2)

## 2019-06-15 LAB — C-REACTIVE PROTEIN: CRP: 1.6 mg/dL — ABNORMAL HIGH (ref <1.0)

## 2019-06-15 LAB — CULTURE, BLOOD (ROUTINE X 2)
Culture: NO GROWTH
Culture: NO GROWTH
Special Requests: ADEQUATE
Special Requests: ADEQUATE

## 2019-06-15 LAB — MAGNESIUM: Magnesium: 2.1 mg/dL (ref 1.7–2.4)

## 2019-06-15 LAB — PREPARE FRESH FROZEN PLASMA

## 2019-06-15 LAB — D-DIMER, QUANTITATIVE: D-Dimer, Quant: 2.57 ug/mL-FEU — ABNORMAL HIGH (ref 0.00–0.50)

## 2019-06-15 LAB — PATHOLOGIST SMEAR REVIEW

## 2019-06-15 LAB — LIPID PANEL
Cholesterol: 83 mg/dL (ref 0–200)
HDL: 32 mg/dL — ABNORMAL LOW (ref >40)
LDL Cholesterol: 42 mg/dL (ref 0–99)
Total CHOL/HDL Ratio: 2.6 RATIO
Triglycerides: 44 mg/dL (ref <150)
VLDL: 9 mg/dL (ref 0–40)

## 2019-06-15 LAB — FERRITIN: Ferritin: 673 ng/mL — ABNORMAL HIGH (ref 24–336)

## 2019-06-15 NOTE — Progress Notes (Signed)
Pt stable with no signs of distress. Report given to oncoming nurse. Safety maintained.

## 2019-06-15 NOTE — Plan of Care (Signed)
  Problem: Respiratory: Goal: Will maintain a patent airway Outcome: Progressing   Problem: Education: Goal: Knowledge of risk factors and measures for prevention of condition will improve Outcome: Progressing

## 2019-06-15 NOTE — Progress Notes (Signed)
PROGRESS NOTE    Jason Malone  WEX:[id number] DOB: 1940/10/22 DOA: 06/14/2019 PCP: Delsa Grana, PA-C   Brief Narrative:   Jason Malone is a 79 y.o.WM PMHx  diabetes type 2 controlled with complication, HLD, HTN, CAD, Paroxysmal Atrial Flutter not on anticoagulant, Hx Bronchus and Lungcarcinoid tumor s/p LUL lobectomy January 2015, metastasis lung cancer to liver 2019 receiving chemotherapy. BPH,  COVID-19 positive on1/28, presented to ED with cough for 10 days, SOB for a few days. He reports dry cough, subjective fever, chills, headache, fatigue, poor oral intake, stomach upset. His O2 sat down to 84% at home, prompt ED visit. He denieschest pain, palpitation, abdominal pain, diarrhea, dysuria, leg swelling or calf pain.He got his first Covid [employer]) 1/19 on January 14.  States received first vaccination [employer]) 1/19    Subjective: Afebrile overnight, A/O x4, positive S OB (improving)   Assessment & Plan:   Active Problems:   Essential hypertension   S/P lobectomy of lung   BPH without urinary obstruction   Paroxysmal atrial flutter (HCC)   Coronary artery disease involving native heart without angina pectoris   Liver mass, left lobe   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   Neuroendocrine carcinoma of lung (HCC)   Pneumonia due to COVID-19 virus   Acute respiratory failure with hypoxia (HCC)   Diabetes mellitus type 2, controlled, with complications (HCC)   HLD (hyperlipidemia)   Tracheal mass   Covid pneumonia/acute respiratory failure with hypoxia COVID-19 Labs  Recent Labs    06/14/19 0355 06/14/19 1145 06/15/19 0114  DDIMER  --  2.53* 2.57*  FERRITIN  --  814* 673*  CRP 1.7* 2.4* 1.6*    Lab Results  Component Value Date   SARSCOV2NAA DETECTED (A) 06/07/2019   [place] NEGATIVE 11/03/2018   -States received first vaccination [employer]) 1/19  -Decadron 6 mg daily -Remdesivir per pharmacy protocol (completed) -2/4 transfuse 1  unit Covid convalescent plasma -Vitamins per Covid protocol -Combivent -Flutter valve -Incentive spirometry -Titrate O2 to maintain SPO2> 88% -Prone patient 16 hours/day; if cannot tolerate prone 2 to 3 hours per shift  Endotracheal mass (2 cm) -Seen on CTA PE protocol see results above.  Per patient MRI chest 3 months ago did not show mass.  NOTE review oh EMR shows patient had MRI abdomen 03/15/2019 not chest. -Recurrence of carcinoid tumor? -treated by Dr. Grayland Ormond for stage IV lung cancer, receiving injections monthly oflanreotide,will need follow up -2/5 discussed case with Dr. Delight Hoh, states would like patient to follow-up ASAP following discharge from hospital in order to obtain biopsy endotracheal mass.  We both agreed most likely scenario is recurrence of his LUL carcinoid tumor.  Essential HTN -Strict in and out -643ml -Daily weight Filed Weights   06/14/19 1025 06/15/19 0332  Weight: 65.8 kg 68.5 kg  -Amlodipine 2.5 mg daily -Losartan 100 mg daily  Paroxysmal atrial flutter  -See HTN -Currently NSR  CAD -See HTN -ASA 81 mg daily  Diabetes type 2 controlled with complication -01/10/8100 hemoglobin A1c= 6.3 -Sensitive SSI  HLD -2/5 LDL= 42  BPH -Finasteride 5 mg daily -Flomax 0.4 mg daily    Goals of care -2/5 PT/OT consult; evaluate for SNF vs home health   DVT prophylaxis: Lovenox Code Status: Full Family Communication: 2/4 spoke [name] (wife) counseled on plan of care, answered all questions Disposition Plan:    Consultants:  Phone consult oncology Dr. Delight Hoh     Procedures/Significant Events:  1/31 PCXR;-significant interval  development of bilateral airspace opacities, greatest on the right, concerning for multifocal pneumonia (viral or bacterial). 2/1 CT chest PE protocol;1. Bilateral ground-glass pneumonia consistent with COVID-19 positivity. 2. 2 cm left main endobronchial mass close to a left upper  lobectomy margin, presumably local recurrence of bronchial carcinoid.    I have personally reviewed and interpreted all radiology studies and my findings are as above.  VENTILATOR SETTINGS: HFNC 2/5 Flow; 5 L/min SPO2; 94%   Cultures 1/28 Novel coronavirus positive 1/31 blood RIGHT wrist NGTD 1/31 LEFT AC NGTD 2/4 respiratory virus panel pending 2/4 blood pending   Antimicrobials: Anti-infectives (From admission, onward)   Start     Dose/Rate Stop   06/15/19 1000  remdesivir 100 mg in sodium chloride 0.9 % 100 mL IVPB  Status:  Discontinued     100 mg 200 mL/hr over 30 Minutes 06/14/19 1119   06/14/19 1200  remdesivir 100 mg in sodium chloride 0.9 % 100 mL IVPB     100 mg 200 mL/hr over 30 Minutes 06/14/19 1710   06/14/19 1100  remdesivir 200 mg in sodium chloride 0.9% 250 mL IVPB  Status:  Discontinued     200 mg 580 mL/hr over 30 Minutes 06/14/19 [place] / TUBES:      Continuous Infusions:   Objective: Vitals:   06/15/19 0000 06/15/19 0332 06/15/19 0400 06/15/19 0600  BP: (!) 115/49  129/63   Pulse: 61  62   Resp: 13  19   Temp: 98.5 F (36.9 C)  97.6 F (36.4 C)   TempSrc: Oral  Oral   SpO2: 91%  93% 90%  Weight:  68.5 kg    Height:        Intake/Output Summary (Last 24 hours) at 06/15/2019 0747 Last data filed at 06/15/2019 0500 Gross per 24 hour  Intake 523 ml  Output 1125 ml  Net -602 ml   Filed Weights   06/14/19 1025 06/15/19 0332  Weight: 65.8 kg 68.5 kg    Examination:  General: A/O x4, positive acute respiratory distress Eyes: negative scleral hemorrhage, negative anisocoria, negative icterus ENT: Negative Runny nose, negative gingival bleeding, Neck:  Negative scars, masses, torticollis, lymphadenopathy, JVD Lungs: Positive diffuse expiratory wheeze, RIGHT lung field crackles  Cardiovascular: Regular rate and rhythm without murmur gallop or rub normal S1 and S2 Abdomen: negative abdominal pain,  nondistended, positive soft, bowel sounds, no rebound, no ascites, no appreciable mass Extremities: No significant cyanosis, clubbing, or edema bilateral lower extremities Skin: Negative rashes, lesions, ulcers Psychiatric:  Negative depression, negative anxiety, negative fatigue, negative mania  Central nervous system:  Cranial nerves II through XII intact, tongue/uvula midline, all extremities muscle strength 5/5, sensation intact throughout, negative dysarthria, negative expressive aphasia, negative receptive aphasia.  .     Data Reviewed: Care during the described time interval was provided by me .  I have reviewed this patient's available data, including medical history, events of note, physical examination, and all test results as part of my evaluation.   CBC: Recent Labs  Lab 06/12/19 0444 06/13/19 0401 06/14/19 0355 06/14/19 1145 06/15/19 0114  WBC 13.5* 20.4* 22.2* 24.0* 15.9*  NEUTROABS 11.0* 17.8* 18.3* 18.8* 13.4*  HGB 14.2 14.1 14.0 15.2 13.6  HCT 42.7 42.2 42.0 45.3 40.4  MCV 89.9 90.2 89.6 90.2 90.2  PLT 208 225 230 239 469   Basic Metabolic Panel: Recent Labs  Lab 06/12/19 0444 06/13/19 0401 06/14/19 0355 06/14/19 1145  06/15/19 0114  NA 144 143 141 143 142  K 4.5 4.5 4.5 4.0 4.0  CL 107 107 104 103 106  CO2 27 26 27 26 27   GLUCOSE 168* 175* 175* 104* 121*  BUN 29* 34* 29* 30* 30*  CREATININE 0.95 0.90 0.79 0.86 0.85  CALCIUM 8.8* 8.6* 8.8* 8.8* 8.6*  MG 2.1 2.1 2.0 2.1 2.1  PHOS  --   --   --  2.6 3.1   GFR: Estimated Creatinine Clearance: 61.9 mL/min (by C-G formula based on SCr of 0.85 mg/dL). Liver Function Tests: Recent Labs  Lab 06/12/19 0444 06/13/19 0401 06/14/19 0355 06/14/19 1145 06/15/19 0114  AST 61* 58* 62* 59* 40  ALT 57* 57* 73* 75* 63*  ALKPHOS 184* 162* 171* 182* 161*  BILITOT 0.5 0.5 0.9 1.1 0.5  PROT 6.2* 5.8* 5.8* 6.1* 5.9*  ALBUMIN 3.1* 3.0* 3.0* 3.2* 3.0*   No results for input(s): LIPASE, AMYLASE in the last 168  hours. No results for input(s): AMMONIA in the last 168 hours. Coagulation Profile: No results for input(s): INR, PROTIME in the last 168 hours. Cardiac Enzymes: No results for input(s): CKTOTAL, CKMB, CKMBINDEX, TROPONINI in the last 168 hours. BNP (last 3 results) No results for input(s): PROBNP in the last 8760 hours. HbA1C: Recent Labs    06/14/19 1145  HGBA1C 6.8*   CBG: Recent Labs  Lab 06/14/19 1226 06/14/19 1707 06/14/19 2019 06/14/19 2323 06/15/19 0324  GLUCAP 105* 248* 200* 154* 119*   Lipid Profile: Recent Labs    06/15/19 0114  CHOL 83  HDL 32*  LDLCALC 42  TRIG 44  CHOLHDL 2.6   Thyroid Function Tests: No results for input(s): TSH, T4TOTAL, FREET4, T3FREE, THYROIDAB in the last 72 hours. Anemia Panel: Recent Labs    06/14/19 1145 06/15/19 0114  FERRITIN 814* 673*   Urine analysis:    Component Value Date/Time   COLORURINE YELLOW 05/31/2013 1545   APPEARANCEUR Clear 12/17/2016 1423   LABSPEC 1.026 05/31/2013 1545   PHURINE 5.5 05/31/2013 1545   GLUCOSEU Negative 12/17/2016 1423   HGBUR NEGATIVE 05/31/2013 1545   BILIRUBINUR Negative 12/17/2016 1423   KETONESUR NEGATIVE 05/31/2013 1545   PROTEINUR Negative 12/17/2016 1423   PROTEINUR NEGATIVE 05/31/2013 1545   UROBILINOGEN 0.2 05/31/2013 1545   NITRITE Negative 12/17/2016 1423   NITRITE NEGATIVE 05/31/2013 1545   LEUKOCYTESUR Negative 12/17/2016 1423   Sepsis Labs: @LABRCNTIP (procalcitonin:4,lacticidven:4)  ) Recent Results (from the past 240 hour(s))  Novel Coronavirus, NAA (Hosp order, Send-out to Ref Lab; TAT 18-24 hrs     Status: Abnormal   Collection Time: 06/07/19 10:19 AM   Specimen: Nasopharyngeal Swab; Respiratory  Result Value Ref Range Status   SARS-CoV-2, NAA DETECTED (A) NOT DETECTED Final    Comment: [place] at [facility] notified at 1351 06/08/2019 by [place]. (NOTE)                  Client Requested Flag This nucleic acid amplification test was developed and its performance  characteristics determined by [employer]. Nucleic acid amplification tests include RT-PCR and TMA. This test has not been FDA cleared or approved. This test has been authorized by FDA under an Emergency Use Authorization (EUA). This test is only authorized for the duration of time the declaration that circumstances exist justifying the authorization of the emergency use of in vitro diagnostic tests for detection of SARS-CoV-2 virus and/or diagnosis of COVID-19 infection under section 564(b)(1) of the Act, 21 U.S.C. 606TKZ-6(W) (1), unless the authorization  is terminated or revoked sooner. When diagnostic testing is negative, the possibility of a false negative result should be considered in the context of a patient's recent exposures and the presence of clinical signs and symptoms consistent with  COVID-19. An individual without symptoms of COVID- 19 and who is not shedding SARS-CoV-2 virus would expect to have a negative (not detected) result in this assay. Performed At: [facility] [place], [place] [id number] Rush Farmer MD CV:[id number] Performed at [facility], [address] [place], [place] [address]    Coronavirus Source NASOPHARYNGEAL  Final    Comment: Performed at [facility], [address], [place], [place] [address]  Blood Culture (routine x 2)     Status: None   Collection Time: 06/10/19  9:12 PM   Specimen: BLOOD  Result Value Ref Range Status   Specimen Description BLOOD BLOOD RIGHT WRIST  Final   Special Requests   Final    BOTTLES DRAWN AEROBIC AND ANAEROBIC Blood Culture adequate volume   Culture   Final    NO GROWTH 5 DAYS Performed at [facility], [address] [place], [place] [address]    Report Status 06/15/2019 FINAL  Final  Blood Culture (routine x 2)     Status: None   Collection Time: 06/10/19  9:12 PM   Specimen: BLOOD  Result Value Ref Range Status   Specimen Description  BLOOD LEFT ANTECUBITAL  Final   Special Requests   Final    BOTTLES DRAWN AEROBIC AND ANAEROBIC Blood Culture adequate volume   Culture   Final    NO GROWTH 5 DAYS Performed at [facility], [address] [place], [place] [address]    Report Status 06/15/2019 FINAL  Final  Respiratory Panel by PCR     Status: None   Collection Time: 06/14/19 12:52 PM   Specimen: Nasopharyngeal Swab; Respiratory  Result Value Ref Range Status   Adenovirus NOT DETECTED NOT DETECTED Final   Coronavirus 229E NOT DETECTED NOT DETECTED Final    Comment: (NOTE) The Coronavirus on the Respiratory Panel, DOES NOT test for the novel  Coronavirus (2019 nCoV)    Coronavirus HKU1 NOT DETECTED NOT DETECTED Final   Coronavirus NL63 NOT DETECTED NOT DETECTED Final   Coronavirus OC43 NOT DETECTED NOT DETECTED Final   Metapneumovirus NOT DETECTED NOT DETECTED Final   Rhinovirus / Enterovirus NOT DETECTED NOT DETECTED Final   Influenza A NOT DETECTED NOT DETECTED Final   Influenza B NOT DETECTED NOT DETECTED Final   Parainfluenza Virus 1 NOT DETECTED NOT DETECTED Final   Parainfluenza Virus 2 NOT DETECTED NOT DETECTED Final   Parainfluenza Virus 3 NOT DETECTED NOT DETECTED Final   Parainfluenza Virus 4 NOT DETECTED NOT DETECTED Final   Respiratory Syncytial Virus NOT DETECTED NOT DETECTED Final   Bordetella pertussis NOT DETECTED NOT DETECTED Final   Chlamydophila pneumoniae NOT DETECTED NOT DETECTED Final   Mycoplasma pneumoniae NOT DETECTED NOT DETECTED Final    Comment: Performed at [facility], [place] [address] [place], [place] [address]         Radiology Studies: No results found.      Scheduled Meds: . sodium chloride   Intravenous Once  . amLODipine  2.5 mg Oral Daily  . vitamin C  500 mg Oral Daily  . aspirin EC  81 mg Oral Daily  . dexamethasone (DECADRON) injection  6 mg Intravenous Q24H  . enoxaparin (LOVENOX) injection  40  mg Subcutaneous Q24H  . finasteride  5 mg Oral  Daily  . insulin aspart  0-9 Units Subcutaneous Q4H  . Ipratropium-Albuterol  1 puff Inhalation Q6H  . losartan  100 mg Oral Daily  . tamsulosin  0.4 mg Oral QHS  . zinc sulfate  220 mg Oral Daily   Continuous Infusions:   LOS: 1 day   The patient is critically ill with multiple organ systems failure and requires high complexity decision making for assessment and support, frequent evaluation and titration of therapies, application of advanced monitoring technologies and extensive interpretation of multiple databases. Critical Care Time devoted to patient care services described in this note  Time spent: 40 minutes     Jessee Mezera, Geraldo Docker, MD [facility] Pager [phone]  If 7PM-7AM, please contact night-coverage [email or web address] Password [facility] 06/15/2019, 7:47 AM

## 2019-06-15 NOTE — Evaluation (Signed)
Physical Therapy Evaluation Patient Details Name: Jason Malone MRN: [id number] DOB: [birth year] Today's Date: 06/15/2019   History of Present Illness  79 y.o.WM PMHx  diabetes type 2 controlled with complication, HLD, HTN, CAD, Paroxysmal Atrial Flutter not on anticoagulant, Hx Bronchus and Lungcarcinoid tumor s/p LUL lobectomy January 2015, metastasis lung cancer to liver 2019 receiving chemotherapy. BPH,COVID-19 positive on1/28, presented to ED with cough for 10 days, SOB for a few days. He reports dry cough, subjective fever, chills, headache, fatigue, poor oral intake, stomach upset. His O2 sat down to 84% at home, prompt ED visit. He got his first Covid [employer]) 1/19 on January 14.   Clinical Impression   Pleasant pt admitted with above hx and above dx. PTA was living home with spouse, tested negative, and was very independent despite hx of illness. Pt states he walks at least 20 mins a day on treadmill as instructed by physicians. This pm pt doing very well with mobility, he does present with deficits in balance and coordination, activity tolerance and independence. He was able to ambulate around room approx 167ft x 2 (with seated rest between two) with min guard assist and no assistive device. Initially pt was on 7L/min via HFNC and was satting in 90s at rest, with activity pt was increased to 8L/min via HFNC and sats did drop on initial 160ft to min 86% but was able to recover with seated rest and pursed lip breathing. On later 171ft ambulation pt was noted to desat to 77% briefly and again with cues for pursed lip breathing was able to recover to 90s. Pt will greatly benefit from continued PT tx while in hospital to address above mentioned deficits, pt will also benefit from home health PT once dc home.     Follow Up Recommendations Home health PT    Equipment Recommendations  None recommended by PT    Recommendations for Other Services OT consult     Precautions / Restrictions  Precautions Precautions: Fall Restrictions Weight Bearing Restrictions: No      Mobility  Bed Mobility               General bed mobility comments: pt sitting in recliner at therapist arrival  Transfers Overall transfer level: Needs assistance Equipment used: None Transfers: Sit to/from Stand Sit to Stand: Supervision            Ambulation/Gait Ambulation/Gait assistance: Min guard Gait Distance (Feet): 120 Feet Assistive device: 1 person hand held assist Gait Pattern/deviations: Step-through pattern;Narrow base of support Gait velocity: decreased slightly   General Gait Details: 1 LOB with ambulation min guard to correct, 2 x 121ft  Stairs            Wheelchair Mobility    Modified Rankin (Stroke Patients Only)       Balance Overall balance assessment: Needs assistance Sitting-balance support: Feet supported Sitting balance-Leahy Scale: Good     Standing balance support: During functional activity Standing balance-Leahy Scale: Fair Standing balance comment: 1 lob with ambulation                             Pertinent Vitals/Pain Pain Assessment: No/denies pain    Home Living Family/patient expects to be discharged to:: Private residence Living Arrangements: Spouse/significant other Available Help at Discharge: Family Type of Home: House Home Access: Stairs to enter   [employer] of Steps: 1 Home Layout: One level Home Equipment: None  Prior Function Level of Independence: Independent         Comments: walks few miles a day rides stationary bike     Hand Dominance   Dominant Hand: Right    Extremity/Trunk Assessment   Upper Extremity Assessment Upper Extremity Assessment: Generalized weakness    Lower Extremity Assessment Lower Extremity Assessment: Generalized weakness       Communication   Communication: No difficulties  Cognition Arousal/Alertness: Awake/alert Behavior During Therapy:  WFL for tasks assessed/performed Overall Cognitive Status: Within Functional Limits for tasks assessed                                        General Comments General comments (skin integrity, edema, etc.): 7L/min with rest and sats in 90s, inceased to 8L/min with ambulation desat to 86% on itial run and down to 77% (briefly) on latter trial    Exercises Other Exercises Other Exercises: flutter valve x10 Other Exercises: incentive spirometer x 10   Assessment/Plan    PT Assessment Patient needs continued PT services  PT Problem List Decreased strength;Decreased activity tolerance;Decreased balance;Decreased mobility;Decreased coordination;Decreased knowledge of use of DME;Decreased safety awareness       PT Treatment Interventions DME instruction;Gait training;Functional mobility training;Therapeutic activities;Therapeutic exercise;Balance training;Neuromuscular re-education;Patient/family education    PT Goals (Current goals can be found in the Care Plan section)  Acute Rehab PT Goals Patient Stated Goal: to get better and go home to spouse PT Goal Formulation: With patient Time For Goal Achievement: 06/29/19 Potential to Achieve Goals: Good    Frequency Min 3X/week   Barriers to discharge        Co-evaluation               AM-PAC PT "6 Clicks" Mobility  Outcome Measure Help needed turning from your back to your side while in a flat bed without using bedrails?: None Help needed moving from lying on your back to sitting on the side of a flat bed without using bedrails?: None Help needed moving to and from a bed to a chair (including a wheelchair)?: A Little Help needed standing up from a chair using your arms (e.g., wheelchair or bedside chair)?: A Little Help needed to walk in hospital room?: A Little Help needed climbing 3-5 steps with a railing? : A Lot 6 Click Score: 19    End of Session Equipment Utilized During Treatment: Oxygen Activity  Tolerance: Patient limited by fatigue;Treatment limited secondary to medical complications (Comment) Patient left: in chair;with call bell/phone within reach;with chair alarm set Nurse Communication: Mobility status PT Visit Diagnosis: Unsteadiness on feet (R26.81);Other abnormalities of gait and mobility (R26.89)    Time: 1761-6073 PT Time Calculation (min) (ACUTE ONLY): 27 min   Charges:   PT Evaluation $PT Eval Moderate Complexity: 1 Mod PT Treatments $Gait Training: 8-22 mins        Horald Chestnut, PT   Delford Field 06/15/2019, 4:53 PM

## 2019-06-16 LAB — COMPREHENSIVE METABOLIC PANEL
ALT: 51 U/L — ABNORMAL HIGH (ref 0–44)
AST: 30 U/L (ref 15–41)
Albumin: 2.9 g/dL — ABNORMAL LOW (ref 3.5–5.0)
Alkaline Phosphatase: 147 U/L — ABNORMAL HIGH (ref 38–126)
Anion gap: 13 (ref 5–15)
BUN: 31 mg/dL — ABNORMAL HIGH (ref 8–23)
CO2: 25 mmol/L (ref 22–32)
Calcium: 8.2 mg/dL — ABNORMAL LOW (ref 8.9–10.3)
Chloride: 101 mmol/L (ref 98–111)
Creatinine, Ser: 0.79 mg/dL (ref 0.61–1.24)
GFR calc Af Amer: >60 mL/min (ref >60)
GFR calc non Af Amer: >60 mL/min (ref >60)
Glucose, Bld: 176 mg/dL — ABNORMAL HIGH (ref 70–99)
Potassium: 4 mmol/L (ref 3.5–5.1)
Sodium: 139 mmol/L (ref 135–145)
Total Bilirubin: 0.8 mg/dL (ref 0.3–1.2)
Total Protein: 5.8 g/dL — ABNORMAL LOW (ref 6.5–8.1)

## 2019-06-16 LAB — CBC WITH DIFFERENTIAL/PLATELET
Abs Immature Granulocytes: 0.53 10*3/uL — ABNORMAL HIGH (ref 0.00–0.07)
Basophils Absolute: 0.1 10*3/uL (ref 0.0–0.1)
Basophils Relative: 0 %
Eosinophils Absolute: 0 10*3/uL (ref 0.0–0.5)
Eosinophils Relative: 0 %
HCT: 39.7 % (ref 39.0–52.0)
Hemoglobin: 13.3 g/dL (ref 13.0–17.0)
Immature Granulocytes: 3 %
Lymphocytes Relative: 3 %
Lymphs Abs: 0.7 10*3/uL (ref 0.7–4.0)
MCH: 30.2 pg (ref 26.0–34.0)
MCHC: 33.5 g/dL (ref 30.0–36.0)
MCV: 90.2 fL (ref 80.0–100.0)
Monocytes Absolute: 2 10*3/uL — ABNORMAL HIGH (ref 0.1–1.0)
Monocytes Relative: 10 %
Neutro Abs: 17 10*3/uL — ABNORMAL HIGH (ref 1.7–7.7)
Neutrophils Relative %: 84 %
Platelets: 217 10*3/uL (ref 150–400)
RBC: 4.4 MIL/uL (ref 4.22–5.81)
RDW: 14.6 % (ref 11.5–15.5)
WBC: 20.3 10*3/uL — ABNORMAL HIGH (ref 4.0–10.5)
nRBC: 0 % (ref 0.0–0.2)

## 2019-06-16 LAB — FERRITIN: Ferritin: 505 ng/mL — ABNORMAL HIGH (ref 24–336)

## 2019-06-16 LAB — C-REACTIVE PROTEIN: CRP: 1.3 mg/dL — ABNORMAL HIGH (ref <1.0)

## 2019-06-16 LAB — PHOSPHORUS: Phosphorus: 2.7 mg/dL (ref 2.5–4.6)

## 2019-06-16 LAB — D-DIMER, QUANTITATIVE: D-Dimer, Quant: 3.14 ug/mL-FEU — ABNORMAL HIGH (ref 0.00–0.50)

## 2019-06-16 LAB — MAGNESIUM: Magnesium: 2.2 mg/dL (ref 1.7–2.4)

## 2019-06-16 MED ORDER — INSULIN ASPART 100 UNIT/ML ~~LOC~~ SOLN
0.0000 [IU] | SUBCUTANEOUS | Status: DC
  Administered 2019-06-16: 3 [IU] via SUBCUTANEOUS
  Administered 2019-06-16 (×2): 5 [IU] via SUBCUTANEOUS
  Administered 2019-06-16 – 2019-06-17 (×2): 3 [IU] via SUBCUTANEOUS

## 2019-06-16 MED ORDER — SALINE SPRAY 0.65 % NA SOLN
2.0000 | NASAL | Status: DC | PRN
  Filled 2019-06-16: qty 44

## 2019-06-16 NOTE — Progress Notes (Signed)
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 BPH,COVID-19 positive on1/28, presented to ED with cough for 10 days, SOB for a few days. He reports dry cough, subjective fever, chills, headache, fatigue, poor oral intake, stomach upset. His O2 sat down to 84% at home, prompt ED visit. He got his first Covid [employer]) 1/19 on January 14.    Clinical Impression   Patient is pleasant.  He lives at home with wife and is independent and very active at prior level. He was on 5L O2 at start of session and resting SpO2 95 (see ambulatory sat note).  Patient was short of breath after walk (1/4 DS) and educated on pursed lip breathing/  Completed flutter valve and incentive spirometer (pulled 2000). Patient able to walk 100 ft with min guard and no assistive device.  He is supervision for standing ADLs. Patient would benefit from further OT to increase activity tolerance for increased independence.  Will continue to follow with OT acutely to address the deficits listed below.      Follow Up Recommendations  Home health OT    Equipment Recommendations  3 in 1 bedside commode    Recommendations for Other Services       Precautions / Restrictions Precautions Precautions: Fall Restrictions Weight Bearing Restrictions: No      Mobility Bed Mobility               General bed mobility comments: pt sitting in recliner at therapist arrival  Transfers Overall transfer level: Needs assistance Equipment used: None Transfers: Sit to/from Stand Sit to Stand: Supervision              Balance Overall balance assessment: Needs  assistance Sitting-balance support: Feet supported Sitting balance-Leahy Scale: Good     Standing balance support: During functional activity Standing balance-Leahy Scale: Fair                             ADL either performed or assessed with clinical judgement   ADL Overall ADL's : Needs assistance/impaired Eating/Feeding: Independent;Sitting   Grooming: Standing;Supervision/safety   Upper Body Bathing: Min guard;Standing   Lower Body Bathing: Min guard;Sitting/lateral leans   Upper Body Dressing : Supervision/safety;Sitting   Lower Body Dressing: Min guard;Sit to/from stand   Toilet Transfer: Min guard;Ambulation           Functional mobility during ADLs: Min guard       Vision         Perception     Praxis      Pertinent Vitals/Pain Pain Assessment: No/denies pain     Hand Dominance Right   Extremity/Trunk Assessment Upper Extremity Assessment Upper Extremity Assessment: Generalized weakness           Communication Communication Communication: No difficulties   Cognition Arousal/Alertness: Awake/alert Behavior During Therapy: WFL for tasks assessed/performed Overall Cognitive Status: Within Functional Limits for tasks assessed                                     General Comments  Exercises Exercises: Other exercises Other Exercises Other Exercises: flutter valve x10 Other Exercises: incentive spirometer x 10   Shoulder Instructions      Home Living Family/patient expects to be discharged to:: Private residence Living Arrangements: Spouse/significant other Available Help at Discharge: Family Type of Home: House Home Access: Stairs to enter Technical brewer of Steps: 1 Entrance Stairs-Rails: None Home Layout: One level     Bathroom Shower/Tub: Tub/shower unit;Walk-in shower         Home Equipment: None          Prior Functioning/Environment Level of Independence: Independent         Comments: walks few miles a day rides stationary bike        OT Problem List: Decreased strength;Decreased activity tolerance;Impaired balance (sitting and/or standing);Cardiopulmonary status limiting activity      OT Treatment/Interventions: Self-care/ADL training;Therapeutic exercise;Energy conservation;Therapeutic activities;Balance training;Patient/family education    OT Goals(Current goals can be found in the care plan section) Acute Rehab OT Goals Patient Stated Goal: to get better and go home to spouse OT Goal Formulation: With patient Time For Goal Achievement: 06/30/19 Potential to Achieve Goals: Good  OT Frequency: Min 3X/week   Barriers to D/C:            Co-evaluation              AM-PAC OT "6 Clicks" Daily Activity     Outcome Measure Help from another person eating meals?: None Help from another person taking care of personal grooming?: A Little Help from another person toileting, which includes using toliet, bedpan, or urinal?: A Little Help from another person bathing (including washing, rinsing, drying)?: A Little Help from another person to put on and taking off regular upper body clothing?: A Little Help from another person to put on and taking off regular lower body clothing?: A Little 6 Click Score: 19   End of Session Equipment Utilized During Treatment: Oxygen Nurse Communication: Mobility status  Activity Tolerance: Patient tolerated treatment well Patient left: in chair;with call bell/phone within reach  OT Visit Diagnosis: Unsteadiness on feet (R26.81);Muscle weakness (generalized) (M62.81)(Cardiopulminary limitations)                Time: 7290-2111 OT Time Calculation (min): 34 min Charges:  OT General Charges $OT Visit: 1 Visit OT Evaluation $OT Eval Moderate Complexity: 1 Mod OT Treatments $Self Care/Home Management : 8-22 mins  August Luz, OTR/L   Phylliss Bob 06/16/2019, 3:22 PM

## 2019-06-16 NOTE — Progress Notes (Signed)
SATURATION QUALIFICATIONS: (This note is used to comply with regulatory documentation for home oxygen)  Patient Saturations on Room Air at Rest = 90%  Patient Saturations on Room Air while Ambulating = 83%  Patient Saturations on 8 Liters of oxygen while Ambulating = 90%  Please briefly explain why patient needs home oxygen:  Patient requires O2 to keep SpO2 >90 with mobility.   August Luz, OTR/L

## 2019-06-16 NOTE — Plan of Care (Signed)
Pt remains on 5L HFNC. Sats 88-90%. Other VSS and WDL.    Problem: Education: Goal: Knowledge of risk factors and measures for prevention of condition will improve Outcome: Progressing   Problem: Coping: Goal: Psychosocial and spiritual needs will be supported Outcome: Progressing   Problem: Respiratory: Goal: Will maintain a patent airway Outcome: Progressing Goal: Complications related to the disease process, condition or treatment will be avoided or minimized Outcome: Progressing

## 2019-06-16 NOTE — Progress Notes (Signed)
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 BPH,  COVID-19 positive on1/28, presented to ED with cough for 10 days, SOB for a few days. He reports dry cough, subjective fever, chills, headache, fatigue, poor oral intake, stomach upset. His O2 sat down to 84% at home, prompt ED visit. He denieschest pain, palpitation, abdominal pain, diarrhea, dysuria, leg swelling or calf pain.He got his first Covid [employer]) 1/19 on January 14.  States received first vaccination [employer]) 1/19    Subjective: 2/6 afebrile last 24 hours    Assessment & Plan:   Active Problems:   Essential hypertension   S/P lobectomy of lung   BPH without urinary obstruction   Paroxysmal atrial flutter (HCC)   Coronary artery disease involving native heart without angina pectoris   Liver mass, left lobe   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   Neuroendocrine carcinoma of lung (HCC)   Pneumonia due to COVID-19 virus   Acute respiratory failure with hypoxia (HCC)   Diabetes mellitus type 2, controlled, with complications (HCC)   HLD (hyperlipidemia)   Tracheal mass   Covid pneumonia/acute respiratory failure with hypoxia COVID-19 Labs  Recent Labs    06/14/19 1145 06/15/19 0114 06/16/19 0218  DDIMER 2.53* 2.57* 3.14*  FERRITIN 814* 673* 505*  CRP 2.4* 1.6* 1.3*    Lab Results  Component Value Date   SARSCOV2NAA DETECTED (A) 06/07/2019   Bergoo NEGATIVE 11/03/2018   -States received first vaccination [employer]) 1/19  -Decadron 6 mg daily -Remdesivir per pharmacy protocol (completed) -2/4 transfuse 1 unit Covid convalescent  plasma -Vitamins per Covid protocol -Combivent -Flutter valve -Incentive spirometry -Titrate O2 to maintain SPO2> 88% -Prone patient 16 hours/day; if cannot tolerate prone 2 to 3 hours per shift SATURATION QUALIFICATIONS: (This note is used to comply with regulatory documentation for home oxygen) Patient Saturations on Room Air at Rest = 90% Patient Saturations on Room Air while Ambulating = 83% Patient Saturations on 8 Liters of oxygen while Ambulating = 90% Please briefly explain why patient needs home oxygen:  Patient requires O2 to keep SpO2 >90 with mobility. -Patient meets criteria for home O2 -8 L per  titrate to maintain SPO2> 88%. -Provide Inogen portable home O2 concentrator  Endotracheal mass (2 cm) -Seen on CTA PE protocol see results above.  Per patient MRI chest 3 months ago did not show mass.  NOTE review oh EMR shows patient had MRI abdomen 03/15/2019 not chest. -Recurrence of carcinoid tumor? -treated by Dr. Grayland Ormond for stage IV lung cancer, receiving injections monthly oflanreotide,will need follow up -2/5 discussed case with Dr. Delight Hoh, states would like patient to follow-up ASAP following discharge from hospital in order to obtain biopsy endotracheal mass.  We both agreed most likely scenario is recurrence of his LUL carcinoid tumor.  Essential HTN -Strict in and out -1.6 L -Daily weight Filed Weights   06/14/19 1025 06/15/19 0332 06/16/19 0407  Weight: 65.8 kg 68.5 kg 68.1 kg  -Amlodipine 2.5 mg daily -Losartan 100 mg daily  Paroxysmal atrial flutter  -See HTN -Currently NSR  CAD -See HTN -ASA 81 mg daily  Diabetes type 2 controlled with complication -  05/11/2018 hemoglobin A1c= 6.3 -2/16 increase moderate SSI   HLD -2/5 LDL= 42  BPH -Finasteride 5 mg daily -Flomax 0.4 mg daily    Goals of care -2/5 PT/OT consult; evaluate for SNF vs home health   DVT prophylaxis: Lovenox Code Status: Full Family Communication: 2/4 spoke  [name] (wife) counseled on plan of care, answered all questions Disposition Plan:    Consultants:  Phone consult oncology Dr. Delight Hoh     Procedures/Significant Events:  1/31 PCXR;-significant interval development of bilateral airspace opacities, greatest on the right, concerning for multifocal pneumonia (viral or bacterial). 2/1 CT chest PE protocol;1. Bilateral ground-glass pneumonia consistent with COVID-19 positivity. 2. 2 cm left main endobronchial mass close to a left upper lobectomy margin, presumably local recurrence of bronchial carcinoid.    I have personally reviewed and interpreted all radiology studies and my findings are as above.  VENTILATOR SETTINGS: HFNC 2/6 Flow; 5 L/min SPO2; 90%   Cultures 1/28 Novel coronavirus positive 1/31 blood RIGHT wrist NGTD 1/31 LEFT AC NGTD 2/4 respiratory virus panel pending 2/4 blood pending   Antimicrobials: Anti-infectives (From admission, onward)   Start     Dose/Rate Stop   06/15/19 1000  remdesivir 100 mg in sodium chloride 0.9 % 100 mL IVPB  Status:  Discontinued     100 mg 200 mL/hr over 30 Minutes 06/14/19 1119   06/14/19 1200  remdesivir 100 mg in sodium chloride 0.9 % 100 mL IVPB     100 mg 200 mL/hr over 30 Minutes 06/14/19 1710   06/14/19 1100  remdesivir 200 mg in sodium chloride 0.9% 250 mL IVPB  Status:  Discontinued     200 mg 580 mL/hr over 30 Minutes 06/14/19 [place] / TUBES:      Continuous Infusions:   Objective: Vitals:   06/15/19 2340 06/16/19 0313 06/16/19 0407 06/16/19 0750  BP: (!) 128/55 117/64  125/63  Pulse: 62 68    Resp: 16 18    Temp: 99 F (37.2 C) 98.3 F (36.8 C)  98.3 F (36.8 C)  TempSrc: Oral Axillary  Oral  SpO2: 94% 90%    Weight:   68.1 kg   Height:        Intake/Output Summary (Last 24 hours) at 06/16/2019 0912 Last data filed at 06/15/2019 2341 Gross per 24 hour  Intake 0 ml  Output 1050 ml  Net -1050 ml   Filed  Weights   06/14/19 1025 06/15/19 0332 06/16/19 0407  Weight: 65.8 kg 68.5 kg 68.1 kg   Physical Exam:  General: A/O x4, positive acute respiratory distress Eyes: negative scleral hemorrhage, negative anisocoria, negative icterus ENT: Negative Runny nose, negative gingival bleeding, Neck:  Negative scars, masses, torticollis, lymphadenopathy, JVD Lungs: Positive diffuse expiratory wheeze, RIGHT lung field crackles Cardiovascular: Regular rate and rhythm without murmur gallop or rub normal S1 and S2 Abdomen: negative abdominal pain, nondistended, positive soft, bowel sounds, no rebound, no ascites, no appreciable mass Extremities: No significant cyanosis, clubbing, or edema bilateral lower extremities Skin: Negative rashes, lesions, ulcers Psychiatric:  Negative depression, negative anxiety, negative fatigue, negative mania  Central nervous system:  Cranial nerves II through XII intact, tongue/uvula midline, all extremities muscle strength 5/5, sensation intact throughout, negative dysarthria, negative expressive aphasia, negative receptive aphasia.   .     Data Reviewed: Care during the described time interval was provided by me .  I have reviewed this patient's available data, including medical  history, events of note, physical examination, and all test results as part of my evaluation.   CBC: Recent Labs  Lab 06/13/19 0401 06/14/19 0355 06/14/19 1145 06/15/19 0114 06/16/19 0218  WBC 20.4* 22.2* 24.0* 15.9* 20.3*  NEUTROABS 17.8* 18.3* 18.8* 13.4* 17.0*  HGB 14.1 14.0 15.2 13.6 13.3  HCT 42.2 42.0 45.3 40.4 39.7  MCV 90.2 89.6 90.2 90.2 90.2  PLT 225 230 239 219 373   Basic Metabolic Panel: Recent Labs  Lab 06/13/19 0401 06/14/19 0355 06/14/19 1145 06/15/19 0114 06/16/19 0218  NA 143 141 143 142 139  K 4.5 4.5 4.0 4.0 4.0  CL 107 104 103 106 101  CO2 26 27 26 27 25   GLUCOSE 175* 175* 104* 121* 176*  BUN 34* 29* 30* 30* 31*  CREATININE 0.90 0.79 0.86 0.85 0.79    CALCIUM 8.6* 8.8* 8.8* 8.6* 8.2*  MG 2.1 2.0 2.1 2.1 2.2  PHOS  --   --  2.6 3.1 2.7   GFR: Estimated Creatinine Clearance: 65.8 mL/min (by C-G formula based on SCr of 0.79 mg/dL). Liver Function Tests: Recent Labs  Lab 06/13/19 0401 06/14/19 0355 06/14/19 1145 06/15/19 0114 06/16/19 0218  AST 58* 62* 59* 40 30  ALT 57* 73* 75* 63* 51*  ALKPHOS 162* 171* 182* 161* 147*  BILITOT 0.5 0.9 1.1 0.5 0.8  PROT 5.8* 5.8* 6.1* 5.9* 5.8*  ALBUMIN 3.0* 3.0* 3.2* 3.0* 2.9*   No results for input(s): LIPASE, AMYLASE in the last 168 hours. No results for input(s): AMMONIA in the last 168 hours. Coagulation Profile: No results for input(s): INR, PROTIME in the last 168 hours. Cardiac Enzymes: No results for input(s): CKTOTAL, CKMB, CKMBINDEX, TROPONINI in the last 168 hours. BNP (last 3 results) No results for input(s): PROBNP in the last 8760 hours. HbA1C: Recent Labs    06/14/19 1145  HGBA1C 6.8*   CBG: Recent Labs  Lab 06/15/19 0324 06/15/19 0746 06/15/19 1152 06/15/19 1552 06/15/19 2002  GLUCAP 119* 126* 182* 228* 221*   Lipid Profile: Recent Labs    06/15/19 0114  CHOL 83  HDL 32*  LDLCALC 42  TRIG 44  CHOLHDL 2.6   Thyroid Function Tests: No results for input(s): TSH, T4TOTAL, FREET4, T3FREE, THYROIDAB in the last 72 hours. Anemia Panel: Recent Labs    06/15/19 0114 06/16/19 0218  FERRITIN 673* 505*   Urine analysis:    Component Value Date/Time   COLORURINE YELLOW 05/31/2013 1545   APPEARANCEUR Clear 12/17/2016 1423   LABSPEC 1.026 05/31/2013 1545   PHURINE 5.5 05/31/2013 1545   GLUCOSEU Negative 12/17/2016 1423   HGBUR NEGATIVE 05/31/2013 1545   BILIRUBINUR Negative 12/17/2016 1423   KETONESUR NEGATIVE 05/31/2013 1545   PROTEINUR Negative 12/17/2016 1423   PROTEINUR NEGATIVE 05/31/2013 1545   UROBILINOGEN 0.2 05/31/2013 1545   NITRITE Negative 12/17/2016 1423   NITRITE NEGATIVE 05/31/2013 1545   LEUKOCYTESUR Negative 12/17/2016 1423    Sepsis Labs: @LABRCNTIP (procalcitonin:4,lacticidven:4)  ) Recent Results (from the past 240 hour(s))  Novel Coronavirus, NAA (Hosp order, Send-out to Ref Lab; TAT 18-24 hrs     Status: Abnormal   Collection Time: 06/07/19 10:19 AM   Specimen: Nasopharyngeal Swab; Respiratory  Result Value Ref Range Status   SARS-CoV-2, NAA DETECTED (A) NOT DETECTED Final    Comment: [place] at [facility] notified at 1351 06/08/2019 by [place]. (NOTE)                  Client Requested Flag This nucleic acid amplification test  was developed and its performance characteristics determined by [employer]. Nucleic acid amplification tests include RT-PCR and TMA. This test has not been FDA cleared or approved. This test has been authorized by FDA under an Emergency Use Authorization (EUA). This test is only authorized for the duration of time the declaration that circumstances exist justifying the authorization of the emergency use of in vitro diagnostic tests for detection of SARS-CoV-2 virus and/or diagnosis of COVID-19 infection under section 564(b)(1) of the Act, 21 U.S.C. 956LOV-5(I) (1), unless the authorization is terminated or revoked sooner. When diagnostic testing is negative, the possibility of a false negative result should be considered in the context of a patient's recent exposures and the presence of clinical signs and symptoms consistent with  COVID-19. An individual without symptoms of COVID- 19 and who is not shedding SARS-CoV-2 virus would expect to have a negative (not detected) result in this assay. Performed At: [facility] [place], [place] [id number] Rush Farmer MD [id number] Performed at [facility], [address] [place], [place] [address]    Coronavirus Source NASOPHARYNGEAL  Final    Comment: Performed at [facility], [address] [place], [place] [address]  Blood Culture (routine x 2)     Status: None   Collection  Time: 06/10/19  9:12 PM   Specimen: BLOOD  Result Value Ref Range Status   Specimen Description BLOOD BLOOD RIGHT WRIST  Final   Special Requests   Final    BOTTLES DRAWN AEROBIC AND ANAEROBIC Blood Culture adequate volume   Culture   Final    NO GROWTH 5 DAYS Performed at [facility], [address] [place], [place] [address]    Report Status 06/15/2019 FINAL  Final  Blood Culture (routine x 2)     Status: None   Collection Time: 06/10/19  9:12 PM   Specimen: BLOOD  Result Value Ref Range Status   Specimen Description BLOOD LEFT ANTECUBITAL  Final   Special Requests   Final    BOTTLES DRAWN AEROBIC AND ANAEROBIC Blood Culture adequate volume   Culture   Final    NO GROWTH 5 DAYS Performed at [facility], [address] [place], [place] [address]    Report Status 06/15/2019 FINAL  Final  Culture, blood (Routine X 2) w Reflex to ID Panel     Status: None (Preliminary result)   Collection Time: 06/14/19 11:45 AM   Specimen: BLOOD  Result Value Ref Range Status   Specimen Description   Final    BLOOD LEFT ARM Performed at [facility], [place] [address] [place], [place] [address]    Special Requests   Final    BOTTLES DRAWN AEROBIC AND ANAEROBIC Blood Culture adequate volume Performed at [place] [address], [place], [place] [address]    Culture   Final    NO GROWTH 2 DAYS Performed at [place] [address] [place], [place] [address]    Report Status PENDING  Incomplete  Culture, blood (Routine X 2) w Reflex to ID Panel     Status: None (Preliminary result)   Collection Time: 06/14/19 11:56 AM   Specimen: BLOOD  Result Value Ref Range Status   Specimen Description   Final    BLOOD LEFT ARM Performed at [place] [address] [place],  [address]    Special Requests   Final    BOTTLES DRAWN AEROBIC  AND ANAEROBIC Blood Culture adequate volume Performed at  [place] [address], [place], [place] [address]    Culture   Final    NO GROWTH 2 DAYS Performed at [place] [address], [place], [place] [address]    Report Status PENDING  Incomplete  Respiratory Panel by PCR     Status: None   Collection Time: 06/14/19 12:52 PM   Specimen: Nasopharyngeal Swab; Respiratory  Result Value Ref Range Status   Adenovirus NOT DETECTED NOT DETECTED Final   Coronavirus 229E NOT DETECTED NOT DETECTED Final    Comment: (NOTE) The Coronavirus on the Respiratory Panel, DOES NOT test for the novel  Coronavirus (2019 nCoV)    Coronavirus HKU1 NOT DETECTED NOT DETECTED Final   Coronavirus NL63 NOT DETECTED NOT DETECTED Final   Coronavirus OC43 NOT DETECTED NOT DETECTED Final   Metapneumovirus NOT DETECTED NOT DETECTED Final   Rhinovirus / Enterovirus NOT DETECTED NOT DETECTED Final   Influenza A NOT DETECTED NOT DETECTED Final   Influenza B NOT DETECTED NOT DETECTED Final   Parainfluenza Virus 1 NOT DETECTED NOT DETECTED Final   Parainfluenza Virus 2 NOT DETECTED NOT DETECTED Final   Parainfluenza Virus 3 NOT DETECTED NOT DETECTED Final   Parainfluenza Virus 4 NOT DETECTED NOT DETECTED Final   Respiratory Syncytial Virus NOT DETECTED NOT DETECTED Final   Bordetella pertussis NOT DETECTED NOT DETECTED Final   Chlamydophila pneumoniae NOT DETECTED NOT DETECTED Final   Mycoplasma pneumoniae NOT DETECTED NOT DETECTED Final    Comment: Performed at [facility], [place]. [address], [place], [place] [address]         Radiology Studies: No results found.      Scheduled Meds: . sodium chloride   Intravenous Once  . amLODipine  2.5 mg Oral Daily  . vitamin C  500 mg Oral Daily  . aspirin EC  81 mg Oral Daily  . dexamethasone (DECADRON) injection  6 mg Intravenous Q24H  . enoxaparin (LOVENOX) injection  40 mg Subcutaneous Q24H  . finasteride  5 mg Oral Daily  . insulin aspart  0-9 Units Subcutaneous Q4H  .  Ipratropium-Albuterol  1 puff Inhalation Q6H  . losartan  100 mg Oral Daily  . tamsulosin  0.4 mg Oral QHS  . zinc sulfate  220 mg Oral Daily   Continuous Infusions:   LOS: 2 days   The patient is critically ill with multiple organ systems failure and requires high complexity decision making for assessment and support, frequent evaluation and titration of therapies, application of advanced monitoring technologies and extensive interpretation of multiple databases. Critical Care Time devoted to patient care services described in this note  Time spent: 40 minutes     Carinne Brandenburger, Geraldo Docker, MD [facility] Pager [phone]  If 7PM-7AM, please contact night-coverage [email or web address] Password [email or web address] 06/16/2019, 9:12 AM

## 2019-06-16 NOTE — TOC Initial Note (Signed)
Transition of Care [facility]) - Initial/Assessment Note    Patient Details  Name: Jason Malone MRN: [id number] Date of Birth: 1940/05/21  Transition of Care [facility]) CM/SW Contact:    Bartholomew Crews, RN Phone Number: [phone] 06/16/2019, 4:56 PM  Clinical Narrative:                 Spoke with with patient on his room phone. PTA home with spouse. Independent. Still driving. Stated PCP in Epic is no longer his PCP, and advised NCM to contact his spouse to verify who his new PCP would be. Spoke with spouse on the phone who stated that she previous PCP was no longer in practice, but she had made arrangements to switch to Dr. Danny Lawless in [place], [place]. This is who she hopes patient will be set up with as well, howver, patient has to be the one to call the office and provide his information. She plans for patient to do this next week.   Discussed recommendations for DME oxygen. Referral placed to [employer]. Wife will be home tomorrow for delivery.   Discussed recommendations for [facility] PT, OT, and SW, and choice of [place] agency. Referral accepted by [facility].   Patient will need PTAR transport home stating his wife is unable to drive to [place] to pick him up. His son works 3rd shift and will be sleeping during the day. His other son does not drive.   TOC team following for transition needs.   Expected Discharge Plan: [place] Barriers to Discharge: Continued Medical Work up   Patient Goals and CMS Choice Patient states their goals for this hospitalization and ongoing recovery are:: return home with wife CMS Medicare.gov Compare Post Acute Care list provided to:: Patient Choice offered to / list presented to : Patient  Expected Discharge Plan and Services Expected Discharge Plan: [place]   Discharge Planning Services: CM Consult Post Acute Care Choice: Home Health, Durable Medical Equipment Living arrangements for the past 2 months: Single Family Home                  DME Arranged: Oxygen DME Agency: [facility] Date DME Agency Contacted: 06/16/19 Time DME Agency Contacted: [phone] Representative spoke with at DME Agency: [place]: PT, OT, Social Work [place] Date [place]: 06/16/19 Time [place]: [place] Representative spoke with at [place] Arrangements/Services Living arrangements for the past 2 months: [place] city  with:: Self, Spouse Patient language and need for interpreter reviewed:: Yes Do you feel safe going back to the place where you live?: Yes      Need for Family Participation in Patient Care: Yes (Comment) Care giver support system in place?: Yes (comment)   Criminal Activity/Legal Involvement Pertinent to Current Situation/Hospitalization: No - Comment as needed  Activities of Daily Living Home Assistive Devices/Equipment: None ADL Screening (condition at time of admission) Patient's cognitive ability adequate to safely complete daily activities?: Yes Is the patient deaf or have difficulty hearing?: Yes Does the patient have difficulty seeing, even when wearing glasses/contacts?: No Does the patient have difficulty concentrating, remembering, or making decisions?: No Patient able to express need for assistance with ADLs?: Yes Does the patient have difficulty dressing or bathing?: No Independently performs ADLs?: Yes (appropriate for developmental age) Does the patient have difficulty walking or climbing stairs?: No Weakness of Legs: None Weakness of Arms/Hands: None  Permission Sought/Granted Permission  sought to share information with : Family Supports Permission granted to share information with : Yes, Verbal Permission Granted  Share Information with NAME: Jason Malone     Permission granted to share info w Relationship: spouse  Permission granted to share info w Contact Information: [phone]  Emotional  Assessment Appearance:: Appears stated age Attitude/Demeanor/Rapport: Engaged Affect (typically observed): Accepting Orientation: : Oriented to Self, Oriented to  Time, Oriented to Place, Oriented to Situation Alcohol / Substance Use: Not Applicable Psych Involvement: No (comment)  Admission diagnosis:  Pneumonia due to COVID-19 virus [U07.1, J12.82] Patient Active Problem List   Diagnosis Date Noted  . Diabetes mellitus type 2, controlled, with complications ([place]) 56/97/9480  . HLD (hyperlipidemia) 06/14/2019  . 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 Acute respiratory failure with hypoxia ([place]) 06/11/2019  . Elevated LFTs 06/11/2019  . Pneumonia due to COVID-19 virus 06/10/2019  . Contracture of palmar fascia 10/11/2018  . Microalbuminuria due to type 2 diabetes mellitus ([place]) 04/12/2018  . Type 2 diabetes mellitus ([place]) 04/10/2018  . Osteopenia determined by x-ray 01/07/2017  . Neuroendocrine carcinoma of lung ([place]) 12/28/2016  . Low back pain 12/17/2016  . Liver mass, left lobe 08/28/2016  . 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 History of lung cancer 06/29/2016  . Aortic atherosclerosis ([place]) 04/06/2016  . Coronary artery disease involving native heart without angina pectoris 04/06/2016  . History of colonic polyps 10/28/2015  . Medication monitoring encounter 08/15/2015  . Paroxysmal atrial flutter ([place])   . Elevated serum alkaline phosphatase level 03/05/2015  . Allergic rhinitis   . BPH without urinary obstruction   . Hyperlipidemia 02/13/2014  . Sinus bradycardia 07/18/2013  . S/P lobectomy of lung 06/04/2013  . Atrial flutter ([place]) 04/09/2013  . Smoking history 04/09/2013  . Essential hypertension 04/09/2013  . Rectal bleeding 12/15/2012   PCP:  Delsa Grana, PA-C Pharmacy:   [place], [place] - [address] [place] [address] Phone: [phone] Fax: [phone]     Social Determinants of  Health (SDOH) Interventions    Readmission Risk Interventions No flowsheet data found.

## 2019-06-17 LAB — COMPREHENSIVE METABOLIC PANEL
ALT: 41 U/L (ref 0–44)
AST: 25 U/L (ref 15–41)
Albumin: 2.8 g/dL — ABNORMAL LOW (ref 3.5–5.0)
Alkaline Phosphatase: 136 U/L — ABNORMAL HIGH (ref 38–126)
Anion gap: 9 (ref 5–15)
BUN: 31 mg/dL — ABNORMAL HIGH (ref 8–23)
CO2: 25 mmol/L (ref 22–32)
Calcium: 8.5 mg/dL — ABNORMAL LOW (ref 8.9–10.3)
Chloride: 104 mmol/L (ref 98–111)
Creatinine, Ser: 0.85 mg/dL (ref 0.61–1.24)
GFR calc Af Amer: >60 mL/min (ref >60)
GFR calc non Af Amer: >60 mL/min (ref >60)
Glucose, Bld: 147 mg/dL — ABNORMAL HIGH (ref 70–99)
Potassium: 4.2 mmol/L (ref 3.5–5.1)
Sodium: 138 mmol/L (ref 135–145)
Total Bilirubin: 0.9 mg/dL (ref 0.3–1.2)
Total Protein: 5.7 g/dL — ABNORMAL LOW (ref 6.5–8.1)

## 2019-06-17 LAB — CBC WITH DIFFERENTIAL/PLATELET
Abs Immature Granulocytes: 0.72 10*3/uL — ABNORMAL HIGH (ref 0.00–0.07)
Basophils Absolute: 0.1 10*3/uL (ref 0.0–0.1)
Basophils Relative: 0 %
Eosinophils Absolute: 0 10*3/uL (ref 0.0–0.5)
Eosinophils Relative: 0 %
HCT: 40.3 % (ref 39.0–52.0)
Hemoglobin: 13.4 g/dL (ref 13.0–17.0)
Immature Granulocytes: 3 %
Lymphocytes Relative: 3 %
Lymphs Abs: 0.8 10*3/uL (ref 0.7–4.0)
MCH: 30.3 pg (ref 26.0–34.0)
MCHC: 33.3 g/dL (ref 30.0–36.0)
MCV: 91.2 fL (ref 80.0–100.0)
Monocytes Absolute: 2.7 10*3/uL — ABNORMAL HIGH (ref 0.1–1.0)
Monocytes Relative: 11 %
Neutro Abs: 19.4 10*3/uL — ABNORMAL HIGH (ref 1.7–7.7)
Neutrophils Relative %: 83 %
Platelets: 221 10*3/uL (ref 150–400)
RBC: 4.42 MIL/uL (ref 4.22–5.81)
RDW: 14.7 % (ref 11.5–15.5)
WBC: 23.6 10*3/uL — ABNORMAL HIGH (ref 4.0–10.5)
nRBC: 0 % (ref 0.0–0.2)

## 2019-06-17 LAB — PHOSPHORUS: Phosphorus: 3.2 mg/dL (ref 2.5–4.6)

## 2019-06-17 LAB — FERRITIN: Ferritin: 539 ng/mL — ABNORMAL HIGH (ref 24–336)

## 2019-06-17 LAB — MAGNESIUM: Magnesium: 2.2 mg/dL (ref 1.7–2.4)

## 2019-06-17 LAB — C-REACTIVE PROTEIN: CRP: 1 mg/dL — ABNORMAL HIGH (ref <1.0)

## 2019-06-17 LAB — D-DIMER, QUANTITATIVE: D-Dimer, Quant: 2.55 ug/mL-FEU — ABNORMAL HIGH (ref 0.00–0.50)

## 2019-06-17 MED ORDER — DEXAMETHASONE 6 MG PO TABS: 6.0000 mg | ORAL_TABLET | Freq: Every day | ORAL | 0 refills | Status: DC

## 2019-06-17 MED ORDER — GUAIFENESIN-DM 100-10 MG/5ML PO SYRP: 10.0000 mL | ORAL_SOLUTION | ORAL | 0 refills | Status: DC | PRN

## 2019-06-17 MED ORDER — IPRATROPIUM-ALBUTEROL 20-100 MCG/ACT IN AERS: 1.0000 | INHALATION_SPRAY | Freq: Four times a day (QID) | RESPIRATORY_TRACT | 0 refills | Status: DC

## 2019-06-17 MED ORDER — ZINC SULFATE 220 (50 ZN) MG PO CAPS: 220.0000 mg | ORAL_CAPSULE | Freq: Every day | ORAL | 0 refills | Status: AC

## 2019-06-17 NOTE — TOC Transition Note (Signed)
Transition of Care [facility]) - CM/SW Discharge Note Taylynn [place]on Weekend Coverage Transitions of Care RN Case Manager Marvetta Gibbons RN  Patient Details  Name: [name] MRN: [id number] Date of Birth: [birth year]  Transition [facility] Care [facility]) CM/SW Contact:  Dawayne Patricia, RN Phone Number: 06/17/2019, 9:55 AM   Clinical Narrative:    Pt stable for transition home today, per bedside RN- home 02 equipment delivered to the home last night by [employer]. Pt to be transported home via PTAR. Per previous CM note [place] referral has been made to [facility]- notified [name] with [facility] for start of care needs- no further needs noted from TOC.   Final next level of care: Home w Home Health Services Barriers to Discharge: Barriers Resolved, No Barriers Identified   Patient Goals and CMS Choice Patient states their goals for this hospitalization and ongoing recovery are:: return home with wife CMS Medicare.gov Compare Post Acute Care list provided to:: Patient Choice offered to / list presented to : Patient  Discharge Placement               Home with [facility]        Discharge Plan and Services   Discharge Planning Services: CM Consult Post Acute Care Choice: Home Health, Durable Medical Equipment          DME Arranged: Oxygen DME Agency: [facility] Date DME Agency Contacted: 06/16/19 Time DME Agency Contacted: [phone] Representative spoke with at DME Agency: [place]: PT, OT, Social Work [place] Date [place]: 06/16/19 Time [place]: [place] Representative spoke with at [place]: [place] ([place]) Interventions     Readmission Risk Interventions Readmission Risk Prevention Plan 06/17/2019  Transportation Screening Complete  PCP or Specialist Appt within 5-7 Days Complete  Home Care Screening Complete  Medication Review (RN CM) Complete  Some recent data might be hidden

## 2019-06-17 NOTE — Progress Notes (Signed)
CSW spoke with patient's spouse Jason Malone to inform of DC and confirm address in chart is correct home address for PTAR transport.   PTAR forms printed to floor, PTAR called and soonest avail transport requested per RN request.   Pricilla Riffle, LCSW [phone]

## 2019-06-17 NOTE — Discharge Summary (Signed)
Physician Discharge Summary  Jason Malone IOE:[id number] DOB: 1940/10/05 DOA: 06/14/2019  PCP: Delsa Grana, PA-C  Admit date: 06/14/2019 Discharge date: 06/17/2019  Time spent: 30 minutes  Recommendations for Outpatient Follow-up:   Covid pneumonia/acute respiratory failure with hypoxia COVID-19 Labs  Recent Labs    06/15/19 0114 06/16/19 0218 06/17/19 0330  DDIMER 2.57* 3.14* 2.55*  FERRITIN 673* 505* 539*  CRP 1.6* 1.3* 1.0*    Lab Results  Component Value Date   SARSCOV2NAA DETECTED (A) 06/07/2019   [place]ger NEGATIVE 11/03/2018   -States received first vaccination [employer]) 1/19 -Decadron6 mg daily -Remdesivirper pharmacy protocol (completed) -2/4transfuse 1 unit Covid convalescent plasma -Vitamins per Covid protocol -Combivent -Flutter valve -Incentive spirometry -Titrate O2 to maintain SPO2>88% -Prone patient 16 hours/day; if cannot tolerate prone 2 to 3 hours per shift SATURATION QUALIFICATIONS: (Thisnote is usedto comply with regulatory documentation for home oxygen) Patient Saturations on Room Air at Rest =90% Patient Saturations on [employer] while Ambulating =83% Patient Saturations on8Liters of oxygen while Ambulating = 90% Please briefly explain why patient needs home oxygen:Patient requires O2 to keep SpO2 >90 with mobility. -Patient meets criteria for home O2 -8 L per [place] titrate to maintain SPO2> 88%. -Provide Inogen portable home O2 concentrator -Patient will be considered contagious until 2/28. Consult must take precautions such as wearing mask around other people, frequent handwashing, disinfecting of counter surfaces and areas touched, quarantining as much as possible, etc.  Endotracheal mass (2 cm) -Seen on CTA PE protocol see results above. Per patient MRI chest 3 months ago did not show mass. NOTE review oh EMR shows patient had MRI abdomen 03/15/2019 not chest. -Recurrence of carcinoid tumor? -treated by Dr. Grayland Ormond for stage IV lung  cancer, receiving injections monthly oflanreotide,will need follow up -2/5 discussed case with Dr. Delight Hoh, states would like patient to follow-up ASAP following discharge from hospital in order to obtain biopsy endotracheal mass.  We both agreed most likely scenario is recurrence of his LUL carcinoid tumor.  Essential HTN -Strict in and out -1.6 L -Daily weight Filed Weights   06/15/19 0332 06/16/19 0407 06/17/19 0346  Weight: 68.5 kg 68.1 kg 66.1 kg    Paroxysmal atrial flutter -SeeHTN -Currently NSR  CAD -See HTN -ASA 81 mg daily  Diabetes type 2 controlled with complication -05/18/2991 hemoglobin A1c=6.3 -2/16 increase moderate SSI   HLD -2/5 LDL= 42  BPH -Finasteride 5 mg daily -Flomax 0.4 mg daily     Discharge Diagnoses:  Active Problems:   Essential hypertension   S/P lobectomy of lung   BPH without urinary obstruction   Paroxysmal atrial flutter (HCC)   Coronary artery disease involving native heart without angina pectoris   Liver mass, left lobe   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   Neuroendocrine carcinoma of lung (HCC)   Pneumonia due to COVID-19 virus   Acute respiratory failure with hypoxia (HCC)   Diabetes mellitus type 2, controlled, with complications (HCC)   HLD (hyperlipidemia)   Tracheal mass   Discharge Condition: Stable  Diet recommendation: Heart healthy  Filed Weights   06/15/19 0332 06/16/19 0407 06/17/19 0346  Weight: 68.5 kg 68.1 kg 66.1 kg    History of present illness:  Jason W Crossis a78 y.o.WM PMHxdiabetes type 2 controlled with complication, HLD, HTN, CAD, ParoxysmalAtrialFlutternot on anticoagulant,Hx Bronchus andLungcarcinoid tumors/p LUL lobectomy January 2015,metastasis lung cancer to liver 2019receiving chemotherapy.BPH,  COVID-19 positive on1/28,presented to ED with cough for 10 days, SOB for a few  days. He reports dry cough, subjective fever, chills, headache,  fatigue, poor oral intake, stomach upset. His O2 sat down to 84% at home, prompt ED visit. He denieschest pain, palpitation, abdominal pain, diarrhea, dysuria, leg swelling or calf pain.He got his first Covid([employer]) 1/19on January 14.  States received first vaccination [employer]) 1/19  Hospital Course:  Treated for Covid pneumonia/acute respiratory failure with hypoxia. Patient responded to Covid protocol and is stable for discharge. Unfortunately during the stay a 2 cm endotracheal mass was discovered. Case discussed with Dr. Grayland Ormond (oncology), who concurs that most likely recurrence of patient's left upper lung carcinoid tumor. Patient has been instructed to make appointment for follow-up as soon as he is discharged.   Procedures: 1/31 PCXR;-significant interval development of bilateral airspace opacities, greatest on the right, concerning for multifocal pneumonia (viral or bacterial). 2/1 CT chest PE protocol;1. Bilateral ground-glass pneumonia consistent with COVID-19 positivity. 2. 2 cm left main endobronchial mass close to a left upper lobectomy margin, presumably local recurrence of bronchial carcinoid.  Consultations: Phone consult oncology Dr. Delight Hoh  VENTILATOR SETTINGS: HFNC 2/6 Flow; 5 L/min SPO2; 90%  Cultures  1/28Novelcoronavirus positive 1/31 blood RIGHT wrist NGTD 1/31 LEFT AC NGTD 2/4 respiratory virus panel negative 2/4 blood pending   Antibiotics Anti-infectives (From admission, onward)   Start     Dose/Rate Stop   06/15/19 1000  remdesivir 100 mg in sodium chloride 0.9 % 100 mL IVPB  Status:  Discontinued     100 mg 200 mL/hr over 30 Minutes 06/14/19 1119   06/14/19 1200  remdesivir 100 mg in sodium chloride 0.9 % 100 mL IVPB     100 mg 200 mL/hr over 30 Minutes 06/14/19 1710   06/14/19 1100  remdesivir 200 mg in sodium chloride 0.9% 250 mL IVPB  Status:  Discontinued     200 mg 580 mL/hr over 30 Minutes 06/14/19 1119        Discharge Exam: Vitals:   06/16/19 1951 06/16/19 2346 06/17/19 0346 06/17/19 0805  BP: (!) 129/59 130/62 126/64 124/64  Pulse: 72 66 65   Resp: (!) 21 (!) 23 (!) 21 (!) 21  Temp: 98.7 F (37.1 C) 98.6 F (37 C) 98.7 F (37.1 C)   TempSrc: Oral Oral Oral   SpO2: 94% 90% (!) 89% 90%  Weight:   66.1 kg   Height:        General: A/O x4, positive acute respiratory distress Eyes: negative scleral hemorrhage, negative anisocoria, negative icterus ENT: Negative Runny nose, negative gingival bleeding, Neck:  Negative scars, masses, torticollis, lymphadenopathy, JVD Lungs: Positive diffuse expiratory wheeze, RIGHT lung field crackles Cardiovascular: Regular rate and rhythm without murmur gallop or rub normal S1 and S2   Discharge Instructions   Allergies as of 06/17/2019      Reactions   Latex Itching   Tape only- REACTED TO BANDAGE ON ABDOMEN   Tape Itching   Surgical tapes      Medication List    STOP taking these medications   tocilizumab 526 mg in sodium chloride 0.9 % 73.7 mL     TAKE these medications   amLODipine 2.5 MG tablet Commonly known as: NORVASC Take 1 tablet by mouth once daily   atorvastatin 40 MG tablet Commonly known as: LIPITOR TAKE 1 TABLET BY MOUTH ONCE DAILY FOR CHOLESTEROL What changed:   how much to take  how to take this  when to take this  additional instructions   b complex vitamins capsule Take 1  capsule by mouth daily. AM   Calcium 600/Vitamin D 600-400 MG-UNIT Tabs Generic drug: Calcium Carbonate-Vitamin D3 Take 1 tablet by mouth daily.   Cinnamon 500 MG capsule Take 500 mg by mouth at bedtime. BEDTIME   dexamethasone 6 MG tablet Commonly known as: DECADRON Take 1 tablet (6 mg total) by mouth daily.   fexofenadine 180 MG tablet Commonly known as: ALLEGRA Take 180 mg by mouth daily.   finasteride 5 MG tablet Commonly known as: PROSCAR Take 1 tablet (5 mg total) by mouth daily.   guaiFENesin-dextromethorphan  100-10 MG/5ML syrup Commonly known as: ROBITUSSIN DM Take 10 mLs by mouth every 4 (four) hours as needed for cough.   Ipratropium-Albuterol 20-100 MCG/ACT Aers respimat Commonly known as: COMBIVENT Inhale 1 puff into the lungs every 6 (six) hours.   LANREOTIDE ACETATE [place] Inject 1 Dose into the skin every 30 (thirty) days.   losartan 100 MG tablet Commonly known as: COZAAR Take 1 tablet (100 mg total) by mouth daily.   multivitamin with minerals tablet Take 1 tablet by mouth daily. AM   polycarbophil 625 MG tablet Commonly known as: FIBERCON Take 625 mg by mouth 2 (two) times daily. AM   polyethylene glycol 17 g packet Commonly known as: MIRALAX / GLYCOLAX Take 17 g by mouth daily as needed.   tamsulosin 0.4 MG Caps capsule Commonly known as: FLOMAX Take 1 capsule (0.4 mg total) by mouth daily. What changed: when to take this   vitamin C 500 MG tablet Commonly known as: ASCORBIC ACID Take 500 mg by mouth daily. AM   zinc sulfate 220 (50 Zn) MG capsule Take 1 capsule (220 mg total) by mouth daily. Start taking on: June 18, 2019            Durable Medical Equipment  (From admission, onward)         Start     Ordered   06/16/19 1506  For home use only DME oxygen  Once    Comments: -Prone patient 16 hours/day; if cannot tolerate prone 2 to 3 hours per shift SATURATION QUALIFICATIONS: (This note is used to comply with regulatory documentation for home oxygen) Patient Saturations on Room Air at Rest = 90% Patient Saturations on Room Air while Ambulating = 83% Patient Saturations on 8 Liters of oxygen while Ambulating = 90% Please briefly explain why patient needs home oxygen:  Patient requires O2 to keep SpO2 >90 with mobility. -Patient meets criteria for home O2 -8 L per [place] titrate to maintain SPO2> 88%. -Provide Inogen portable home O2 concentrator  Question Answer Comment  Length of Need 12 Months   Mode or (Route) Nasal cannula   Liters per Minute 8    Frequency Continuous (stationary and portable oxygen unit needed)   Oxygen conserving device Yes   Oxygen delivery system Gas      06/16/19 1506         Allergies  Allergen Reactions  . Latex Itching    Tape only- REACTED TO BANDAGE ON ABDOMEN  . Tape Itching    Surgical tapes    Follow-up Information    Lloyd Huger, MD. Schedule an appointment as soon as possible for a visit in 2 week(s).   Specialty: Oncology Why: Dr. Grayland Ormond aware of new 2 cm endotracheal mass. Schedule follow-up for 2 weeks for further work-up Contact information: [place] [address] [phone]            The results of significant  diagnostics from this hospitalization (including imaging, microbiology, ancillary and laboratory) are listed below for reference.    Significant Diagnostic Studies: DG Chest 2 View  Result Date: 06/07/2019 CLINICAL DATA:  Cough EXAM: CHEST - 2 VIEW COMPARISON:  2019 FINDINGS: The heart size and mediastinal contours are within normal limits. Postoperative changes at the left hilum. No new consolidation or edema. Right mid lung calcified granuloma. Degenerative changes of the spine. IMPRESSION: No acute process in the chest Electronically Signed   By: Macy Mis M.D.   On: 06/07/2019 11:31   CT ANGIO CHEST PE W OR WO CONTRAST  Result Date: 06/11/2019 CLINICAL DATA:  Shortness of breath.  COVID positive. EXAM: CT ANGIOGRAPHY CHEST WITH CONTRAST TECHNIQUE: Multidetector CT imaging of the chest was performed using the standard protocol during bolus administration of intravenous contrast. Multiplanar CT image reconstructions and MIPs were obtained to evaluate the vascular anatomy. CONTRAST:  23mL OMNIPAQUE IOHEXOL 350 MG/ML SOLN COMPARISON:  06/27/2016 FINDINGS: Cardiovascular: Normal heart size. No pericardial effusion. Atherosclerotic calcification of the aorta and coronaries. No pulmonary artery filling defect Mediastinum/Nodes: Negative for  adenopathy or mass. Lungs/Pleura: Left upper lobectomy for bronchial carcinoid per chart. There is ground-glass opacity with a subpleural predilection in the right more than left lung consistent with history of COVID 19 positivity. Also, there is a irregular lobulated endobronchial mass at the level of the distal left mainstem bronchus, 2 cm in length. Bronchus appears somewhat thin along the esophageal margin but no clear esophageal fistulization. Upper Abdomen: Cholelithiasis. History of treated liver metastases, reference MRI 03/15/2019. Musculoskeletal: Spondylosis. Rounded sclerotic focus in the T11 vertebral body is stable from 2019. Review of the MIP images confirms the above findings. IMPRESSION: 1. Bilateral ground-glass pneumonia consistent with COVID-19 positivity. 2. 2 cm left main endobronchial mass close to a left upper lobectomy margin, presumably local recurrence of bronchial carcinoid. Electronically Signed   By: Monte Fantasia M.D.   On: 06/11/2019 04:28   DG Chest Port 1 View  Result Date: 06/10/2019 CLINICAL DATA:  Fever.  COVID-19 pneumonia. EXAM: PORTABLE CHEST 1 VIEW COMPARISON:  June 07, 2019 FINDINGS: Been significant interval development diffuse airspace opacities throughout the right lung field with focal areas of consolidation involving the right lower lobe. There is an airspace opacity at the left lung base. There is no pneumothorax. No large pleural effusion. Aortic calcifications are noted. The heart size is normal. IMPRESSION: Significant interval development of bilateral airspace opacities, greatest on the right, concerning for multifocal pneumonia (viral or bacterial). Electronically Signed   By: Constance Holster M.D.   On: 06/10/2019 21:19    Microbiology: Recent Results (from the past 240 hour(s))  Novel Coronavirus, NAA (Hosp order, Send-out to Ref Lab; TAT 18-24 hrs     Status: Abnormal   Collection Time: 06/07/19 10:19 AM   Specimen: Nasopharyngeal Swab;  Respiratory  Result Value Ref Range Status   SARS-CoV-2, NAA DETECTED (A) NOT DETECTED Final    Comment: [place] at [facility] notified at 1351 06/08/2019 by [place]. (NOTE)                  Client Requested Flag This nucleic acid amplification test was developed and its performance characteristics determined by [employer]. Nucleic acid amplification tests include RT-PCR and TMA. This test has not been FDA cleared or approved. This test has been authorized by FDA under an Emergency Use Authorization (EUA). This test is only authorized for the duration of time the declaration that circumstances exist justifying  the authorization of the emergency use of in vitro diagnostic tests for detection of SARS-CoV-2 virus and/or diagnosis of COVID-19 infection under section 564(b)(1) of the Act, 21 U.S.C. 326ZTI-4(P) (1), unless the authorization is terminated or revoked sooner. When diagnostic testing is negative, the possibility of a false negative result should be considered in the context of a patient's recent exposures and the presence of clinical signs and symptoms consistent with  COVID-19. An individual without symptoms of COVID- 19 and who is not shedding SARS-CoV-2 virus would expect to have a negative (not detected) result in this assay. Performed At: [facility] [place], [place] [id number] Rush Farmer MD NK:[id number] Performed at [facility], [address] [place], [place] [address]    Coronavirus Source NASOPHARYNGEAL  Final    Comment: Performed at [facility], [address] [place], [place] [address]  Blood Culture (routine x 2)     Status: None   Collection Time: 06/10/19  9:12 PM   Specimen: BLOOD  Result Value Ref Range Status   Specimen Description BLOOD BLOOD RIGHT WRIST  Final   Special Requests   Final    BOTTLES DRAWN AEROBIC AND ANAEROBIC Blood Culture adequate volume   Culture   Final    NO GROWTH 5  DAYS Performed at [facility], [address] [place], [place] [address]    Report Status 06/15/2019 FINAL  Final  Blood Culture (routine x 2)     Status: None   Collection Time: 06/10/19  9:12 PM   Specimen: BLOOD  Result Value Ref Range Status   Specimen Description BLOOD LEFT ANTECUBITAL  Final   Special Requests   Final    BOTTLES DRAWN AEROBIC AND ANAEROBIC Blood Culture adequate volume   Culture   Final    NO GROWTH 5 DAYS Performed at [facility], [address] [place], [place] [address]    Report Status 06/15/2019 FINAL  Final  Culture, blood (Routine X 2) w Reflex to ID Panel     Status: None (Preliminary result)   Collection Time: 06/14/19 11:45 AM   Specimen: BLOOD  Result Value Ref Range Status   Specimen Description   Final    BLOOD LEFT ARM Performed at [facility], [place] [address] [place], [place] [address]    Special Requests   Final    BOTTLES DRAWN AEROBIC AND ANAEROBIC Blood Culture adequate volume Performed at [facility] [address] [place], [place] [address]    Culture   Final    NO GROWTH 3 DAYS Performed at [facility], [place] [address], [place], [place] [address]    Report Status PENDING  Incomplete  Culture, blood (Routine X 2) w Reflex to ID Panel     Status: None (Preliminary result)   Collection Time: 06/14/19 11:56 AM   Specimen: BLOOD  Result Value Ref Range Status   Specimen Description   Final    BLOOD LEFT ARM Performed at [place] [address] [place], [place] [address]    Special Requests   Final    BOTTLES DRAWN AEROBIC AND ANAEROBIC Blood Culture adequate volume Performed at [facility] [address], [place], [place] [address]    Culture   Final    NO GROWTH 3 DAYS Performed at [facility], [place] [address], [place], [place] [address]    Report Status PENDING  Incomplete  Respiratory  Panel by PCR      Status: None   Collection Time: 06/14/19 12:52 PM   Specimen: Nasopharyngeal Swab; Respiratory  Result Value Ref Range Status   Adenovirus NOT DETECTED NOT DETECTED Final   Coronavirus 229E NOT DETECTED NOT DETECTED Final    Comment: (NOTE) The Coronavirus on the Respiratory Panel, DOES NOT test for the novel  Coronavirus (2019 nCoV)    Coronavirus HKU1 NOT DETECTED NOT DETECTED Final   Coronavirus NL63 NOT DETECTED NOT DETECTED Final   Coronavirus OC43 NOT DETECTED NOT DETECTED Final   Metapneumovirus NOT DETECTED NOT DETECTED Final   Rhinovirus / Enterovirus NOT DETECTED NOT DETECTED Final   Influenza A NOT DETECTED NOT DETECTED Final   Influenza B NOT DETECTED NOT DETECTED Final   Parainfluenza Virus 1 NOT DETECTED NOT DETECTED Final   Parainfluenza Virus 2 NOT DETECTED NOT DETECTED Final   Parainfluenza Virus 3 NOT DETECTED NOT DETECTED Final   Parainfluenza Virus 4 NOT DETECTED NOT DETECTED Final   Respiratory Syncytial Virus NOT DETECTED NOT DETECTED Final   Bordetella pertussis NOT DETECTED NOT DETECTED Final   Chlamydophila pneumoniae NOT DETECTED NOT DETECTED Final   Mycoplasma pneumoniae NOT DETECTED NOT DETECTED Final    Comment: Performed at [facility], [place]. [address] [place], [place] [address]     Labs: Basic Metabolic Panel: Recent Labs  Lab 06/14/19 0355 06/14/19 1145 06/15/19 0114 06/16/19 0218 06/17/19 0330  NA 141 143 142 139 138  K 4.5 4.0 4.0 4.0 4.2  CL 104 103 106 101 104  CO2 27 26 27 25 25   GLUCOSE 175* 104* 121* 176* 147*  BUN 29* 30* 30* 31* 31*  CREATININE 0.79 0.86 0.85 0.79 0.85  CALCIUM 8.8* 8.8* 8.6* 8.2* 8.5*  MG 2.0 2.1 2.1 2.2 2.2  PHOS  --  2.6 3.1 2.7 3.2   Liver Function Tests: Recent Labs  Lab 06/14/19 0355 06/14/19 1145 06/15/19 0114 06/16/19 0218 06/17/19 0330  AST 62* 59* 40 30 25  ALT 73* 75* 63* 51* 41  ALKPHOS 171* 182* 161* 147* 136*  BILITOT 0.9 1.1 0.5 0.8 0.9  PROT 5.8* 6.1* 5.9* 5.8* 5.7*  ALBUMIN  3.0* 3.2* 3.0* 2.9* 2.8*   No results for input(s): LIPASE, AMYLASE in the last 168 hours. No results for input(s): AMMONIA in the last 168 hours. CBC: Recent Labs  Lab 06/14/19 0355 06/14/19 1145 06/15/19 0114 06/16/19 0218 06/17/19 0330  WBC 22.2* 24.0* 15.9* 20.3* 23.6*  NEUTROABS 18.3* 18.8* 13.4* 17.0* 19.4*  HGB 14.0 15.2 13.6 13.3 13.4  HCT 42.0 45.3 40.4 39.7 40.3  MCV 89.6 90.2 90.2 90.2 91.2  PLT 230 239 219 217 221   Cardiac Enzymes: No results for input(s): CKTOTAL, CKMB, CKMBINDEX, TROPONINI in the last 168 hours. BNP: BNP (last 3 results) No results for input(s): BNP in the last 8760 hours.  ProBNP (last 3 results) No results for input(s): PROBNP in the last 8760 hours.  CBG: Recent Labs  Lab 06/15/19 0324 06/15/19 0746 06/15/19 1152 06/15/19 1552 06/15/19 2002  GLUCAP 119* 126* 182* 228* 221*       Signed:  Dia Crawford, MD [facility] [phone] pager

## 2019-06-18 ENCOUNTER — Telehealth: Payer: Self-pay

## 2019-06-18 ENCOUNTER — Telehealth: Payer: Self-pay | Admitting: *Deleted

## 2019-06-18 NOTE — Telephone Encounter (Signed)
Pt cannot do a Video Visit, he can only do phone calls.

## 2019-06-18 NOTE — Telephone Encounter (Signed)
Transition Care Management Follow-up Telephone Call  Date of discharge and from where: 06/17/19 [facility]  How have you been since you were released from the hospital? Spoke to patient's wife, Jason Malone who states patient is doing okay but there was a learning curve with the oxygen.  Any questions or concerns? Yes - pt's wife states he is doing well on steroids and wants to know if they need to be continued at all past 7 days. Advised to discuss with PCP during follow up visit.  Items Reviewed:  Did the pt receive and understand the discharge instructions provided? Yes   Medications obtained and verified? Yes   Any new allergies since your discharge? No   Dietary orders reviewed? Yes  Do you have support at home? Yes   Functional Questionnaire: (I = Independent and D = Dependent) ADLs: I with assistance  Bathing/Dressing- D  Meal Prep- D  Eating- I  Maintaining continence- I  Transferring/Ambulation- I  Managing Meds- I  Follow up appointments reviewed:   PCP Hospital f/u appt confirmed? Yes  Scheduled to see Jason Malone PAC on 06/25/19 @ 1:00.  [facility] f/u appt confirmed? Yes  Scheduled to see Dr. Grayland Malone on 07/02/19. (will likely be earlier)  Are transportation arrangements needed? No   If their condition worsens, is the pt aware to call PCP or go to the Emergency Dept.? Yes  Was the patient provided with contact information for the PCP's office or ED? Yes  Was to pt encouraged to call back with questions or concerns? Yes

## 2019-06-18 NOTE — Telephone Encounter (Signed)
Patient called reporting that he is out of [facility] hospital and was told to see Dr Grayland Ormond about findings on his CT scan when he gets of hospital. He is scheduled for physician/ treatment on 2/22, but he is quarantined for the next 2 weeks and won't be able to come to that appointment. Can you perhaps do a video visit with him soon? Please advise. IMPRESSION: 1. Bilateral ground-glass pneumonia consistent with COVID-19 positivity. 2. 2 cm left main endobronchial mass close to a left upper lobectomy margin, presumably local recurrence of bronchial carcinoid.   Electronically Signed   By: Monte Fantasia M.D.   On: 06/11/2019 04:28

## 2019-06-18 NOTE — Telephone Encounter (Signed)
Yes, lets do a video visit to discuss.

## 2019-06-18 NOTE — Telephone Encounter (Signed)
I guess that will have to do then.

## 2019-06-19 LAB — CULTURE, BLOOD (ROUTINE X 2)
Culture: NO GROWTH
Culture: NO GROWTH
Special Requests: ADEQUATE
Special Requests: ADEQUATE

## 2019-06-21 ENCOUNTER — Encounter: Payer: Self-pay | Admitting: Oncology

## 2019-06-21 NOTE — Progress Notes (Signed)
Patient prescreened for appointment. Patient has no concerns or questions.  

## 2019-06-22 ENCOUNTER — Inpatient Hospital Stay: Payer: Medicare Other | Attending: Oncology | Admitting: Oncology

## 2019-06-22 DIAGNOSIS — C7A8 Other malignant neuroendocrine tumors: Secondary | ICD-10-CM | POA: Diagnosis not present

## 2019-06-23 NOTE — Progress Notes (Signed)
Jason Malone  [phone]) [phone] [phone]) [phone]  ID: Jason Malone OB: April 12, 1941  MR#: [id number]  CSN#:[id number]  Patient Care Team: Delsa Grana, PA-C as PCP - General (Family Medicine) [name], Forest Gleason, MD (General Surgery) Nestor Lewandowsky, MD as Referring Physician (Cardiothoracic Surgery) Minna Merritts, MD as Consulting Physician (Cardiology) [name], MD as Consulting Physician (Cardiothoracic Surgery) Lloyd Huger, MD as Consulting Physician (Oncology) Arnetha Courser, MD as Attending Physician (Family Medicine)  I connected with Jason Malone on 06/23/19 at  9:45 AM EST by telephone visit and verified that I am speaking with the correct person using two identifiers.   I discussed the limitations, risks, security and privacy concerns of performing an evaluation and management service by telemedicine and the availability of in-person appointments. I also discussed with the patient that there may be a patient responsible charge related to this service. The patient expressed understanding and agreed to proceed.   Other persons participating in the visit and their role in the encounter: Patient, MD.  Patient's location: Home. Provider's location: Clinic.  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  INTERVAL HISTORY: Patient agreed to further evaluation and discussion of his imaging results via telephone.  Patient is still under quarantine after being admitted to the hospital for approximately 7 days with Covid.  He continues to have weakness and fatigue, but states he continues to feel better on a daily basis. He has no neurologic complaints.  He has a good appetite and denies weight loss.  He denies any chest pain, shortness of breath, cough, or hemoptysis.  He denies any abdominal pain.  He has no nausea, vomiting, constipation, or diarrhea.  He has no urinary complaints.  Patient offers no  further specific complaints today.  REVIEW OF SYSTEMS:   Review of Systems  Constitutional: Positive for malaise/fatigue. Negative for fever and weight loss.  Respiratory: Negative.  Negative for cough, hemoptysis and shortness of breath.   Cardiovascular: Negative.  Negative for chest pain and leg swelling.  Gastrointestinal: Negative.  Negative for abdominal pain, diarrhea, nausea and vomiting.  Genitourinary: Negative.  Negative for dysuria.  Musculoskeletal: Negative.  Negative for back pain.  Skin: Negative.  Negative for rash.  Neurological: Positive for weakness. Negative for dizziness, sensory change and focal weakness.  Psychiatric/Behavioral: Negative.  The patient is not nervous/anxious.     As per HPI. Otherwise, a complete review of systems is negative.  PAST MEDICAL HISTORY: Past Medical History:  Diagnosis Date  . Allergic rhinitis   . Benign prostatic hypertrophy with lower urinary tract symptoms (LUTS)   . Chest pain    a. 05/2013 MV: EF 70%, no ischemia.  . Diverticulitis    DIVERTICULOSIS  . Enlarged prostate   . Essential hypertension    CONTROLLED ON MEDS  . Full dentures    upper and lower  . H/O hypokalemia   . Hx of atrial fibrillation without current medication    CARDIOLOGIST-DR [facility]  . 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 Hyperlipemia   . Hypomagnesemia   . IFG (impaired fasting glucose)   . Liver cancer ([place])   . Liver cancer ([place]) 12/08/2016  . Liver mass, left lobe 08/28/2016   Probably hemangioma; will get MRI of liver, refer to GI  . Lung cancer ([place])    a. carcinoid, left lung, Stage 1b (T2a, N0, cM0);  b. 05/2013 s/p VATS &  LULobectomy.  . Monocytosis   . Osteopenia   . Paroxysmal atrial flutter ([facility])    a. 03/2013->no recurrence;  b. CHA2DS2VASc = 2-->not currently on anticoagulation;  c. 05/2012 Echo: EF 55-60%, normal RV.  [name] Wears glasses     PAST SURGICAL HISTORY: Past Surgical History:    Procedure Laterality Date  . BRONCHOSCOPY     04/06/2013  . CARDIOVASCULAR STRESS TEST  05/2013   a. No evidence of ischemia or infarct, EF 70%, no WMAs  . CATARACT EXTRACTION W/PHACO Left 07/14/2015   Procedure: CATARACT EXTRACTION PHACO AND INTRAOCULAR LENS PLACEMENT (IOC);  Surgeon: [name], MD;  Location: [place];  Service: Ophthalmology;  Laterality: Left;  . CATARACT EXTRACTION W/PHACO Right 10/01/2015   Procedure: CATARACT EXTRACTION PHACO AND INTRAOCULAR LENS PLACEMENT (IOC);  Surgeon: [name], MD;  Location: [place];  Service: Ophthalmology;  Laterality: Right;  . COLONOSCOPY W/ POLYPECTOMY     bleed after-had to go to surgery to stop bleeding via colonoscopy  . COLONOSCOPY WITH PROPOFOL N/A 11/19/2015   Procedure: COLONOSCOPY WITH PROPOFOL;  Surgeon: [name], MD;  Location: [facility] ENDOSCOPY;  Service: Endoscopy;  Laterality: N/A;  . DUPUYTREN CONTRACTURE RELEASE  12/15/2011   Procedure: DUPUYTREN CONTRACTURE RELEASE;  Surgeon: [name], MD;  Location: [place];  Service: Orthopedics;  Laterality: Left;  Fasciotomy left ring finger dupuytrens  . FASCIECTOMY Right 11/07/2018   Procedure: SEGMENTAL FASCIECTOMY RIGHT RING FINGER;  Surgeon: Daryll Brod, MD;  Location: [place];  Service: Orthopedics;  Laterality: Right;  AXILLARY BLOCK  . IR ANGIOGRAM SELECTIVE EACH ADDITIONAL VESSEL  07/26/2017  . IR ANGIOGRAM SELECTIVE EACH ADDITIONAL VESSEL  07/26/2017  . IR ANGIOGRAM SELECTIVE EACH ADDITIONAL VESSEL  07/26/2017  . IR ANGIOGRAM SELECTIVE EACH ADDITIONAL VESSEL  07/26/2017  . IR ANGIOGRAM SELECTIVE EACH ADDITIONAL VESSEL  07/26/2017  . IR ANGIOGRAM SELECTIVE EACH ADDITIONAL VESSEL  08/11/2017  . IR ANGIOGRAM SELECTIVE EACH ADDITIONAL VESSEL  04/28/2018  . IR ANGIOGRAM VISCERAL SELECTIVE  07/26/2017  . IR ANGIOGRAM VISCERAL SELECTIVE  08/11/2017  . IR ANGIOGRAM VISCERAL SELECTIVE  04/28/2018  . IR EMBO  ARTERIAL NOT HEMORR HEMANG INC GUIDE ROADMAPPING  07/26/2017  . IR EMBO TUMOR ORGAN ISCHEMIA INFARCT INC GUIDE ROADMAPPING  08/11/2017  . IR EMBO TUMOR ORGAN ISCHEMIA INFARCT INC GUIDE ROADMAPPING  04/28/2018  . IR RADIOLOGIST EVAL & MGMT  07/07/2017  . IR RADIOLOGIST EVAL & MGMT  09/07/2017  . IR RADIOLOGIST EVAL & MGMT  12/21/2017  . IR RADIOLOGIST EVAL & MGMT  03/22/2018  . IR RADIOLOGIST EVAL & MGMT  06/08/2018  . IR RADIOLOGIST EVAL & MGMT  11/01/2018  . IR RADIOLOGIST EVAL & MGMT  03/21/2019  . IR US GUIDE VASC ACCESS RIGHT  07/26/2017  . IR US GUIDE VASC ACCESS RIGHT  08/11/2017  . IR US GUIDE VASC ACCESS RIGHT  04/28/2018  . LUNG LOBECTOMY  06/10/13   upper left lung  . MULTIPLE TOOTH EXTRACTIONS    . REMOVAL RETAINED LENS Right 11/17/2015   Procedure: REMOVAL RETAINED LENS FROAGMENTS RIGHT EYE;  Surgeon: [name], MD;  Location: [place];  Service: Ophthalmology;  Laterality: Right;  . TONSILLECTOMY    . UPPER GASTROINTESTINAL ENDOSCOPY  11-03-15   Dr [name]  . VASECTOMY    . VIDEO ASSISTED THORACOSCOPY (VATS)/WEDGE RESECTION Left 06/04/2013   Procedure: VIDEO ASSISTED THORACOSCOPY (VATS)/WEDGE RESECTION;  Surgeon: [name], MD;  Location: [place];  Service: Thoracic;  Laterality: Left;  [name] VIDEO BRONCHOSCOPY N/A 06/04/2013   Procedure: VIDEO BRONCHOSCOPY;  Surgeon: [name], MD;  Location[facility];  Service: Thoracic;  Laterality: N/A;    FAMILY HISTORY: Family History  Problem Relation Age of Onset  . Stroke Mother   . Alzheimer's disease Mother   . Hypertension Sister   . Hyperlipidemia Sister   . Hypertension Brother   . Hyperlipidemia Brother   . Diabetes Brother     ADVANCED DIRECTIVES (Y/N):  N  HEALTH MAINTENANCE: Social History   Tobacco Use  . Smoking status: Former Smoker    Packs/day: 1.00    Years: 40.00    Pack years: 40.00    Types: Cigarettes    Quit date: 12/09/1998    Years since quitting: 20.5  . Smokeless tobacco: Never  Used  Substance Use Topics  . Alcohol use: No  . Drug use: No     Colonoscopy:  PAP:  Bone density:  Lipid panel:  Allergies  Allergen Reactions  . Latex Itching    Tape only- REACTED TO BANDAGE ON ABDOMEN  . Tape Itching    Surgical tapes     Current Outpatient Medications  Medication Sig Dispense Refill  . amLODipine (NORVASC) 2.5 MG tablet Take 1 tablet by mouth once daily (Patient taking differently: Take 2.5 mg by mouth daily. ) 90 tablet 3  . atorvastatin (LIPITOR) 40 MG tablet TAKE 1 TABLET BY MOUTH ONCE DAILY FOR CHOLESTEROL (Patient taking differently: Take 40 mg by mouth daily. ) 90 tablet 1  . b complex vitamins capsule Take 1 capsule by mouth daily. AM    . Calcium Carbonate-Vitamin D3 (CALCIUM 600/VITAMIN D) 600-400 MG-UNIT TABS Take 1 tablet by mouth daily.     . cholecalciferol (VITAMIN D3) 25 MCG (1000 UNIT) tablet Take 1,000 Units by mouth daily.    . Cinnamon 500 MG capsule Take 500 mg by mouth at bedtime. BEDTIME    . dexamethasone (DECADRON) 6 MG tablet Take 1 tablet (6 mg total) by mouth daily. 7 tablet 0  . fexofenadine (ALLEGRA) 180 MG tablet Take 180 mg by mouth daily.    . finasteride (PROSCAR) 5 MG tablet Take 1 tablet (5 mg total) by mouth daily. 90 tablet 1  . guaiFENesin-dextromethorphan (ROBITUSSIN DM) 100-10 MG/5ML syrup Take 10 mLs by mouth every 4 (four) hours as needed for cough. 118 mL 0  . Ipratropium-Albuterol (COMBIVENT) 20-100 MCG/ACT AERS respimat Inhale 1 puff into the lungs every 6 (six) hours. 4 g 0  . LANREOTIDE ACETATE [place] Inject 1 Dose into the skin every 30 (thirty) days.    [name] losartan (COZAAR) 100 MG tablet Take 1 tablet (100 mg total) by mouth daily. 90 tablet 1  . Multiple Vitamins-Minerals (MULTIVITAMIN WITH MINERALS) tablet Take 1 tablet by mouth daily. AM    . polycarbophil (FIBERCON) 625 MG tablet Take 625 mg by mouth 2 (two) times daily. AM    . polyethylene glycol (MIRALAX / GLYCOLAX) packet Take 17 g by mouth daily as  needed.     . tamsulosin (FLOMAX) 0.4 MG CAPS capsule Take 1 capsule (0.4 mg total) by mouth daily. (Patient taking differently: Take 0.4 mg by mouth at bedtime. ) 90 capsule 1  . vitamin C (ASCORBIC ACID) 500 MG tablet Take 500 mg by mouth daily. AM    . zinc sulfate 220 (50 Zn) MG capsule Take 1 capsule (220 mg total) by mouth daily. 30 capsule 0   No current facility-administered medications  for this visit.   Facility-Administered Medications Ordered in Other Visits  Medication Dose Route Frequency Provider Last Rate Last Admin  . lanreotide acetate (SOMATULINE DEPOT) injection 120 mg  120 mg Subcutaneous Once Lloyd Huger, MD        OBJECTIVE: There were no vitals filed for this visit.   There is no height or weight on file to calculate BMI.    ECOG FS:0 - Asymptomatic  LAB RESULTS:  Lab Results  Component Value Date   NA 138 06/17/2019   K 4.2 06/17/2019   CL 104 06/17/2019   CO2 25 06/17/2019   GLUCOSE 147 (H) 06/17/2019   BUN 31 (H) 06/17/2019   CREATININE 0.85 06/17/2019   CALCIUM 8.5 (L) 06/17/2019   PROT 5.7 (L) 06/17/2019   ALBUMIN 2.8 (L) 06/17/2019   AST 25 06/17/2019   ALT 41 06/17/2019   ALKPHOS 136 (H) 06/17/2019   BILITOT 0.9 06/17/2019   GFRNONAA >60 06/17/2019   GFRAA >60 06/17/2019    Lab Results  Component Value Date   WBC 23.6 (H) 06/17/2019   NEUTROABS 19.4 (H) 06/17/2019   HGB 13.4 06/17/2019   HCT 40.3 06/17/2019   MCV 91.2 06/17/2019   PLT 221 06/17/2019     STUDIES: DG Chest 2 View  Result Date: 06/07/2019 CLINICAL DATA:  Cough EXAM: CHEST - 2 VIEW COMPARISON:  2019 FINDINGS: The heart size and mediastinal contours are within normal limits. Postoperative changes at the left hilum. No new consolidation or edema. Right mid lung calcified granuloma. Degenerative changes of the spine. IMPRESSION: No acute process in the chest Electronically Signed   By: Macy Mis M.D.   On: 06/07/2019 11:31   CT ANGIO CHEST PE W OR WO  CONTRAST  Result Date: 06/11/2019 CLINICAL DATA:  Shortness of breath.  COVID positive. EXAM: CT ANGIOGRAPHY CHEST WITH CONTRAST TECHNIQUE: Multidetector CT imaging of the chest was performed using the standard protocol during bolus administration of intravenous contrast. Multiplanar CT image reconstructions and MIPs were obtained to evaluate the vascular anatomy. CONTRAST:  65mL OMNIPAQUE IOHEXOL 350 MG/ML SOLN COMPARISON:  06/27/2016 FINDINGS: Cardiovascular: Normal heart size. No pericardial effusion. Atherosclerotic calcification of the aorta and coronaries. No pulmonary artery filling defect Mediastinum/Nodes: Negative for adenopathy or mass. Lungs/Pleura: Left upper lobectomy for bronchial carcinoid per chart. There is ground-glass opacity with a subpleural predilection in the right more than left lung consistent with history of COVID 19 positivity. Also, there is a irregular lobulated endobronchial mass at the level of the distal left mainstem bronchus, 2 cm in length. Bronchus appears somewhat thin along the esophageal margin but no clear esophageal fistulization. Upper Abdomen: Cholelithiasis. History of treated liver metastases, reference MRI 03/15/2019. Musculoskeletal: Spondylosis. Rounded sclerotic focus in the T11 vertebral body is stable from 2019. Review of the MIP images confirms the above findings. IMPRESSION: 1. Bilateral ground-glass pneumonia consistent with COVID-19 positivity. 2. 2 cm left main endobronchial mass close to a left upper lobectomy margin, presumably local recurrence of bronchial carcinoid. Electronically Signed   By: Monte Fantasia M.D.   On: 06/11/2019 04:28   DG Chest Port 1 View  Result Date: 06/10/2019 CLINICAL DATA:  Fever.  COVID-19 pneumonia. EXAM: PORTABLE CHEST 1 VIEW COMPARISON:  June 07, 2019 FINDINGS: Been significant interval development diffuse airspace opacities throughout the right lung field with focal areas of consolidation involving the right lower  lobe. There is an airspace opacity at the left lung base. There is no pneumothorax. No large pleural  effusion. Aortic calcifications are noted. The heart size is normal. IMPRESSION: Significant interval development of bilateral airspace opacities, greatest on the right, concerning for multifocal pneumonia (viral or bacterial). Electronically Signed   By: Constance Holster M.D.   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  PLAN:    1. 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 Patient underwent left upper lobe lobectomy in [place] on June 04, 2013. Final pathology results revealed a well differentiated neuroendocrine tumor with positive bronchial margins. Patient did not receive adjuvant XRT or chemotherapy at that time.  Patient's most recent abdominal MRI on March 15, 2019 reviewed independently with stable to decrease in size of known lesions in patient's liver.  His most recent Y 90 ablation occurred on April 28, 2018.  Patient's chromogranin a levels are elevated, but relatively stable ranging from 213-261.  Patient has been instructed to keep his previously scheduled follow-up appointment for further evaluation and continuation of 120 mg subcutaneous lanreotide. 2.  Left mainstem endobronchial mass: Mass is closer to the left upper lobe lobectomy margin, therefore likely a local recurrence of disease given his positive margins after surgery in January 2015.  To be sure, patient was given a referral to pulmonary for biopsy. 3.  Covid: Patient was admitted to the hospital for 7 days and now is slowly recovering.  Return to clinic as above.  I provided 25 minutes of non face-to-face telephone visit time during this encounter which included chart review, counseling, and coordination of care as documented above.   Patient  expressed understanding and was in agreement with this plan. He also understands that He can call clinic at any time with any questions, concerns, or complaints.   Cancer Staging Neuroendocrine carcinoma of lung [facility]) Staging form: Lung, AJCC 8th Edition - Clinical stage from 12/28/2016: Stage IVA (cT2a, cN0, cM1b) - Signed by Lloyd Huger, MD on 12/28/2016   Lloyd Huger, MD   06/23/2019 7:52 AM

## 2019-06-25 ENCOUNTER — Encounter: Payer: Self-pay | Admitting: Family Medicine

## 2019-06-25 ENCOUNTER — Ambulatory Visit (INDEPENDENT_AMBULATORY_CARE_PROVIDER_SITE_OTHER): Payer: Medicare Other | Admitting: Family Medicine

## 2019-06-25 VITALS — BP 100/57 | HR 79 | Temp 96.8°F | Resp 18 | Ht 64.0 in | Wt 145.0 lb

## 2019-06-25 DIAGNOSIS — J9601 Acute respiratory failure with hypoxia: Secondary | ICD-10-CM

## 2019-06-25 DIAGNOSIS — Z85118 Personal history of other malignant neoplasm of bronchus and lung: Secondary | ICD-10-CM

## 2019-06-25 DIAGNOSIS — U071 COVID-19: Secondary | ICD-10-CM

## 2019-06-25 DIAGNOSIS — R222 Localized swelling, mass and lump, trunk: Secondary | ICD-10-CM | POA: Diagnosis not present

## 2019-06-25 DIAGNOSIS — J1282 Pneumonia due to coronavirus disease 2019: Secondary | ICD-10-CM

## 2019-06-25 DIAGNOSIS — Z09 Encounter for follow-up examination after completed treatment for conditions other than malignant neoplasm: Secondary | ICD-10-CM

## 2019-06-25 NOTE — Progress Notes (Signed)
Name: Jason Malone   MRN: [id number]    DOB: December 03, 1940   Date:06/25/2019       Progress Note  Subjective:    Chief Complaint  Chief Complaint  Patient presents with  . Hospitalization Follow-up    COVID, he does have in home oxygen  . Shortness of Breath    with excertion oxygen level will drop in the 80's, even on 3L     I connected with  Jason Malone on 06/25/19 at  1:00 PM EST by telephone and verified that I am speaking with the correct person using two identifiers.   I discussed the limitations, risks, security and privacy concerns of performing an evaluation and management service by telephone and the availability of in person appointments. Staff also discussed with the patient that there may be a patient responsible charge related to this service. Patient Location: home Provider Location: [facility] clinic  Additional Individuals present:   HPI  HFU visit -  Admit date: 06/10/2019 Discharge date: 06/17/2019 For Covid pneumonia/acute respiratory failure with hypoxia Covid + on 06/07/2019  Patient did a initial visit with me, he was encouraged to go to the urgent care he was evaluated and sent home, later worsened went to the emergency room at [facility] and later was transferred to [place] for the last 4 days of his admission Transition of care telephone call was initially done on 06/18/2019 by Clemetine Marker Presents today for continued HFU Per discharge summary: -States received first vaccination [employer]) 1/19 -Decadron6 mg daily -Remdesivirper pharmacy protocol (completed) -2/4transfuse 1 unit Covid convalescent plasma  -Vitamins per Covid protocol -Combivent -Flutter valve -Incentive spirometry -Titrate O2 to maintain SPO2>88% -Prone patient 16 hours/day; if cannot tolerate prone 2 to 3 hours per shift SATURATION QUALIFICATIONS: (Thisnote is usedto comply with regulatory documentation for home oxygen) Patient Saturations on Room Air at Rest =90% Patient  Saturations on [employer] while Ambulating =83% Patient Saturations on8Liters of oxygen while Ambulating = 90% Please briefly explain why patient needs home oxygen:Patient requires O2 to keep SpO2 >90 with mobility. -Patient meets criteria for home O2 -8 L per [place] titrate to maintain SPO2>88%. -ProvideInogenportable home O2 concentrator -Patient will be considered contagious until 2/28. Consult must take precautions such as wearing mask around other people, frequent handwashing, disinfecting of counter surfaces and areas touched, quarantining as much as possible, etc.   SpO2 today was 86% -patient and his wife state this was 3L O2 via [place] while bathing but do tend to go back and forth stating that it was with exertion without any oxygen supplementation and then state that it was after he got short of breath and was at rest put 3 L of oxygen back on and it only increased to 86%. Currently patient is at rest with 3L 93% but with any exertion drops even to low 80's or high 70's.3 He can walk from living room to the bedroom and get short of breath they are not titrating the oxygen when he is doing any exertion.  Overall patient pt feels like "every day he's feeling a little better", he denies any chest pain, wheeze, near syncope, confusion. Have pulmonology f/up March, we reviewed and discussed the mass found on the CT and the subsequent follow-up Patient and his wife was concerned that he was on insulin while in the hospital and now he is not on anything we reviewed his past A1c's his blood sugars while in the hospital while on steroids they are  concerned that he has nothing to check his blood sugar with at home.  Because of his past medical history, low A1c, and only mildly elevated blood sugars while actively on steroids discussed how it is unlikely that he will require any diabetes medications while back at home.  Patient does not have any polyuria, polydipsia, nocturia They have HH for PT OT and  SW coming in  BP soft today - 100/57 his BP tends to run low, he does not feel lightheaded, weak, denies near syncope -patient is wife state that his normal and they are not concerned about his readings and do not want change any of his other medication at this time.   Patient Active Problem List   Diagnosis Date Noted  . Diabetes mellitus type 2, controlled, with complications ([place]) 27/07/5007  . HLD (hyperlipidemia) 06/14/2019  . 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 Acute respiratory failure with hypoxia ([place]) 06/11/2019  . Elevated LFTs 06/11/2019  . Pneumonia due to COVID-19 virus 06/10/2019  . Contracture of palmar fascia 10/11/2018  . Microalbuminuria due to type 2 diabetes mellitus ([place]) 04/12/2018  . Type 2 diabetes mellitus ([place]) 04/10/2018  . Osteopenia determined by x-ray 01/07/2017  . Neuroendocrine carcinoma of lung ([place]) 12/28/2016  . Low back pain 12/17/2016  . Liver mass, left lobe 08/28/2016  . 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 History of lung cancer 06/29/2016  . Aortic atherosclerosis ([place]) 04/06/2016  . Coronary artery disease involving native heart without angina pectoris 04/06/2016  . History of colonic polyps 10/28/2015  . Medication monitoring encounter 08/15/2015  . Paroxysmal atrial flutter ([place])   . Elevated serum alkaline phosphatase level 03/05/2015  . Allergic rhinitis   . BPH without urinary obstruction   . Hyperlipidemia 02/13/2014  . Sinus bradycardia 07/18/2013  . S/P lobectomy of lung 06/04/2013  . Atrial flutter ([place]) 04/09/2013  . Smoking history 04/09/2013  . Essential hypertension 04/09/2013  . Rectal bleeding 12/15/2012    Social History   Tobacco Use  . Smoking status: Former Smoker    Packs/day: 1.00    Years: 40.00    Pack years: 40.00    Types: Cigarettes    Quit date: 12/09/1998    Years since quitting: 20.5  . Smokeless tobacco: Never Used  Substance Use Topics  . Alcohol use: No     Current  Outpatient Medications:  .  amLODipine (NORVASC) 2.5 MG tablet, Take 1 tablet by mouth once daily (Patient taking differently: Take 2.5 mg by mouth daily. ), Disp: 90 tablet, Rfl: 3 .  atorvastatin (LIPITOR) 40 MG tablet, TAKE 1 TABLET BY MOUTH ONCE DAILY FOR CHOLESTEROL (Patient taking differently: Take 40 mg by mouth daily. ), Disp: 90 tablet, Rfl: 1 .  b complex vitamins capsule, Take 1 capsule by mouth daily. AM, Disp: , Rfl:  .  Calcium Carbonate-Vitamin D3 (CALCIUM 600/VITAMIN D) 600-400 MG-UNIT TABS, Take 1 tablet by mouth daily. , Disp: , Rfl:  .  cholecalciferol (VITAMIN D3) 25 MCG (1000 UNIT) tablet, Take 1,000 Units by mouth daily., Disp: , Rfl:  .  Cinnamon 500 MG capsule, Take 500 mg by mouth at bedtime. BEDTIME, Disp: , Rfl:  .  fexofenadine (ALLEGRA) 180 MG tablet, Take 180 mg by mouth daily., Disp: , Rfl:  .  finasteride (PROSCAR) 5 MG tablet, Take 1 tablet (5 mg total) by mouth daily., Disp: 90 tablet, Rfl: 1 .  guaiFENesin-dextromethorphan (ROBITUSSIN DM) 100-10 MG/5ML syrup, Take 10 mLs by mouth  every 4 (four) hours as needed for cough., Disp: 118 mL, Rfl: 0 .  Ipratropium-Albuterol (COMBIVENT) 20-100 MCG/ACT AERS respimat, Inhale 1 puff into the lungs every 6 (six) hours., Disp: 4 g, Rfl: 0 .  LANREOTIDE ACETATE [employer], Inject 1 Dose into the skin every 30 (thirty) days., Disp: , Rfl:  .  losartan (COZAAR) 100 MG tablet, Take 1 tablet (100 mg total) by mouth daily., Disp: 90 tablet, Rfl: 1 .  Multiple Vitamins-Minerals (MULTIVITAMIN WITH MINERALS) tablet, Take 1 tablet by mouth daily. AM, Disp: , Rfl:  .  polycarbophil (FIBERCON) 625 MG tablet, Take 625 mg by mouth 2 (two) times daily. AM, Disp: , Rfl:  .  tamsulosin (FLOMAX) 0.4 MG CAPS capsule, Take 1 capsule (0.4 mg total) by mouth daily. (Patient taking differently: Take 0.4 mg by mouth at bedtime. ), Disp: 90 capsule, Rfl: 1 .  vitamin C (ASCORBIC ACID) 500 MG tablet, Take 500 mg by mouth daily. AM, Disp: , Rfl:  .  zinc sulfate  220 (50 Zn) MG capsule, Take 1 capsule (220 mg total) by mouth daily., Disp: 30 capsule, Rfl: 0 .  dexamethasone (DECADRON) 6 MG tablet, Take 1 tablet (6 mg total) by mouth daily. (Patient not taking: Reported on 06/25/2019), Disp: 7 tablet, Rfl: 0 .  polyethylene glycol (MIRALAX / GLYCOLAX) packet, Take 17 g by mouth daily as needed. , Disp: , Rfl:  No current facility-administered medications for this visit.  Facility-Administered Medications Ordered in Other Visits:  .  lanreotide acetate (SOMATULINE DEPOT) injection 120 mg, 120 mg, Subcutaneous, Once, Grayland Ormond, Kathlene November, MD  Allergies  Allergen Reactions  . Latex Itching    Tape only- REACTED TO BANDAGE ON ABDOMEN  . Tape Itching    Surgical tapes     Chart Review: I personally reviewed active problem list, medication list, allergies, family history, social history, health maintenance, notes from last encounter, lab results, imaging with the patient/caregiver today.   Review of Systems  Constitutional: Negative.   HENT: Negative.   Eyes: Negative.   Respiratory: Negative.   Cardiovascular: Negative.   Gastrointestinal: Negative.   Endocrine: Negative.   Genitourinary: Negative.   Musculoskeletal: Negative.   Skin: Negative.   Allergic/Immunologic: Negative.   Neurological: Negative.   Hematological: Negative.   Psychiatric/Behavioral: Negative.   All other systems reviewed and are negative.    Objective:    Virtual encounter, vitals limited, only able to obtain the following Today's Vitals   06/25/19 1150  BP: (!) 100/57  Pulse: 79  Resp: 18  Temp: (!) 96.8 F (36 C)  SpO2: (!) 86%  Weight: 145 lb (65.8 kg)  Height: 5\' 4"  (1.626 m)   Body mass index is 24.89 kg/m. Nursing Note and Vital Signs reviewed.  Physical Exam Vitals and nursing note reviewed.  Pulmonary:     Effort: No tachypnea or respiratory distress.     Comments: No audible tachypnea, respiratory distress, wheeze or stridor Neurological:      Mental Status: He is alert.     PE limited by telephone encounter  No results found for this or any previous visit (from the past 72 hour(s)).  Assessment and Plan:   1. Acute respiratory failure with hypoxia ([place]) Patient continues to require oxygen states that he was requiring 5 L via nasal cannula all the time even when at rest but that has improved to 3 L, still decreased oxygen saturation with any exertion or ambulation.  I reviewed with him and his wife at  length how to titrate flow to maintain oxygen saturation on his pulse ox to greater than 88%.  Was very concerned about some of the numbers they are giving me and they are slightly difficult historians tend to go back and forth contradicting themselves when attempting to clarify what history is given but overall the state that he is gradually improving he is feeling like he is getting little bit better every day and they do have pulmonology follow-up in the next couple weeks  2. Pneumonia due to COVID-19 virus He has finished his steroids and continues to use oxygen, see above, encouraged follow-up with any worsening symptoms at all any increasing oxygen demand chest pain cough lethargy respiratory distress, patient and his wife verbalized understanding  3. History of lung cancer A do see oncology still and patient has follow-up with pulmonology  4. Mass in chest irregular lobulated endobronchial mass at the level of the distal left mainstem bronchus, 2 cm in length.  It is near his prior lobectomy suspected to be a local recurrence of bronchial carcinoid, patient does have pulmonology follow-up believe for biopsy  5. Encounter for examination following treatment at hospital Reviewed at length his hospitalization, hospital course, blood work, discharge summary and reviewed it with the patient and his wife today  Additionally today blood pressure was soft at 100/57 encouraged him to monitor this, push fluids, and would want to  see him for follow-up as soon as we could in clinic or he should go to the ER if blood pressure continues to be under 100/60.  -Red flags and when to present for emergency care or RTC including but not limited to new/worsening/un-resolving symptoms,  reviewed with patient at time of visit. Follow up and care instructions discussed and provided in AVS. - I discussed the assessment and treatment plan with the patient. The patient was provided an opportunity to ask questions and all were answered. The patient agreed with the plan and demonstrated an understanding of the instructions.  - The patient was advised to call back or seek an in-person evaluation if the symptoms worsen or if the condition fails to improve as anticipated.  I provided 25 minutes of non-face-to-face time during this encounter.  Delsa Grana, PA-C 06/25/19 1:15 PM

## 2019-06-28 ENCOUNTER — Other Ambulatory Visit: Payer: Medicare Other

## 2019-06-28 NOTE — Progress Notes (Deleted)
[place]  [phone]) [phone] [phone]) [phone]  ID: Jason Malone OB: 08-23-1940  MR#: [id number]  AOZ#:[id number]  Patient Care Team: Delsa Grana, PA-C as PCP - General (Family Medicine) [name], Forest Gleason, MD (General Surgery) Nestor Lewandowsky, MD as Referring Physician (Cardiothoracic Surgery) Minna Merritts, MD as Consulting Physician (Cardiology) [name], MD as Consulting Physician (Cardiothoracic Surgery) Lloyd Huger, MD as Consulting Physician (Oncology) Arnetha Courser, MD as Attending Physician (Family Medicine)  I connected with Jason Malone on 06/28/19 at  1:00 PM EST by telephone visit and verified that I am speaking with the correct person using two identifiers.   I discussed the limitations, risks, security and privacy concerns of performing an evaluation and management service by telemedicine and the availability of in-person appointments. I also discussed with the patient that there may be a patient responsible charge related to this service. The patient expressed understanding and agreed to proceed.   Other persons participating in the visit and their role in the encounter: Patient, MD.  Patient's location: Home. Provider's location: Clinic.  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  INTERVAL HISTORY: Patient agreed to further evaluation and discussion of his imaging results via telephone.  Patient is still under quarantine after being admitted to the hospital for approximately 7 days with Covid.  He continues to have weakness and fatigue, but states he continues to feel better on a daily basis. He has no neurologic complaints.  He has a good appetite and denies weight loss.  He denies any chest pain, shortness of breath, cough, or hemoptysis.  He denies any abdominal pain.  He has no nausea, vomiting, constipation, or diarrhea.  He has no urinary complaints.  Patient offers no  further specific complaints today.  REVIEW OF SYSTEMS:   Review of Systems  Constitutional: Positive for malaise/fatigue. Negative for fever and weight loss.  Respiratory: Negative.  Negative for cough, hemoptysis and shortness of breath.   Cardiovascular: Negative.  Negative for chest pain and leg swelling.  Gastrointestinal: Negative.  Negative for abdominal pain, diarrhea, nausea and vomiting.  Genitourinary: Negative.  Negative for dysuria.  Musculoskeletal: Negative.  Negative for back pain.  Skin: Negative.  Negative for rash.  Neurological: Positive for weakness. Negative for dizziness, sensory change and focal weakness.  Psychiatric/Behavioral: Negative.  The patient is not nervous/anxious.     As per HPI. Otherwise, a complete review of systems is negative.  PAST MEDICAL HISTORY: Past Medical History:  Diagnosis Date  . Allergic rhinitis   . Benign prostatic hypertrophy with lower urinary tract symptoms (LUTS)   . Chest pain    a. 05/2013 MV: EF 70%, no ischemia.  . Diverticulitis    DIVERTICULOSIS  . Enlarged prostate   . Essential hypertension    CONTROLLED ON MEDS  . Full dentures    upper and lower  . H/O hypokalemia   . Hx of atrial fibrillation without current medication    CARDIOLOGIST-DR [facility]  . 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 Hyperlipemia   . Hypomagnesemia   . IFG (impaired fasting glucose)   . Liver cancer ([place])   . Liver cancer ([place]) 12/08/2016  . Liver mass, left lobe 08/28/2016   Probably hemangioma; will get MRI of liver, refer to GI  . Lung cancer ([place])    a. carcinoid, left lung, Stage 1b (T2a, N0, cM0);  b. 05/2013 s/p VATS &  LULobectomy.  . Monocytosis   . Osteopenia   . Paroxysmal atrial flutter ([facility])    a. 03/2013->no recurrence;  b. CHA2DS2VASc = 2-->not currently on anticoagulation;  c. 05/2012 Echo: EF 55-60%, normal RV.  [name] Wears glasses     PAST SURGICAL HISTORY: Past Surgical History:    Procedure Laterality Date  . BRONCHOSCOPY     04/06/2013  . CARDIOVASCULAR STRESS TEST  05/2013   a. No evidence of ischemia or infarct, EF 70%, no WMAs  . CATARACT EXTRACTION W/PHACO Left 07/14/2015   Procedure: CATARACT EXTRACTION PHACO AND INTRAOCULAR LENS PLACEMENT (IOC);  Surgeon: [name], MD;  Location: [place];  Service: Ophthalmology;  Laterality: Left;  . CATARACT EXTRACTION W/PHACO Right 10/01/2015   Procedure: CATARACT EXTRACTION PHACO AND INTRAOCULAR LENS PLACEMENT (IOC);  Surgeon: [name], MD;  Location: [place];  Service: Ophthalmology;  Laterality: Right;  . COLONOSCOPY W/ POLYPECTOMY     bleed after-had to go to surgery to stop bleeding via colonoscopy  . COLONOSCOPY WITH PROPOFOL N/A 11/19/2015   Procedure: COLONOSCOPY WITH PROPOFOL;  Surgeon: [name], MD;  Location: [facility] ENDOSCOPY;  Service: Endoscopy;  Laterality: N/A;  . DUPUYTREN CONTRACTURE RELEASE  12/15/2011   Procedure: DUPUYTREN CONTRACTURE RELEASE;  Surgeon: [name], MD;  Location: [place];  Service: Orthopedics;  Laterality: Left;  Fasciotomy left ring finger dupuytrens  . FASCIECTOMY Right 11/07/2018   Procedure: SEGMENTAL FASCIECTOMY RIGHT RING FINGER;  Surgeon: Daryll Brod, MD;  Location: [place];  Service: Orthopedics;  Laterality: Right;  AXILLARY BLOCK  . IR ANGIOGRAM SELECTIVE EACH ADDITIONAL VESSEL  07/26/2017  . IR ANGIOGRAM SELECTIVE EACH ADDITIONAL VESSEL  07/26/2017  . IR ANGIOGRAM SELECTIVE EACH ADDITIONAL VESSEL  07/26/2017  . IR ANGIOGRAM SELECTIVE EACH ADDITIONAL VESSEL  07/26/2017  . IR ANGIOGRAM SELECTIVE EACH ADDITIONAL VESSEL  07/26/2017  . IR ANGIOGRAM SELECTIVE EACH ADDITIONAL VESSEL  08/11/2017  . IR ANGIOGRAM SELECTIVE EACH ADDITIONAL VESSEL  04/28/2018  . IR ANGIOGRAM VISCERAL SELECTIVE  07/26/2017  . IR ANGIOGRAM VISCERAL SELECTIVE  08/11/2017  . IR ANGIOGRAM VISCERAL SELECTIVE  04/28/2018  . IR EMBO  ARTERIAL NOT HEMORR HEMANG INC GUIDE ROADMAPPING  07/26/2017  . IR EMBO TUMOR ORGAN ISCHEMIA INFARCT INC GUIDE ROADMAPPING  08/11/2017  . IR EMBO TUMOR ORGAN ISCHEMIA INFARCT INC GUIDE ROADMAPPING  04/28/2018  . IR RADIOLOGIST EVAL & MGMT  07/07/2017  . IR RADIOLOGIST EVAL & MGMT  09/07/2017  . IR RADIOLOGIST EVAL & MGMT  12/21/2017  . IR RADIOLOGIST EVAL & MGMT  03/22/2018  . IR RADIOLOGIST EVAL & MGMT  06/08/2018  . IR RADIOLOGIST EVAL & MGMT  11/01/2018  . IR RADIOLOGIST EVAL & MGMT  03/21/2019  . IR US GUIDE VASC ACCESS RIGHT  07/26/2017  . IR US GUIDE VASC ACCESS RIGHT  08/11/2017  . IR US GUIDE VASC ACCESS RIGHT  04/28/2018  . LUNG LOBECTOMY  06/10/13   upper left lung  . MULTIPLE TOOTH EXTRACTIONS    . REMOVAL RETAINED LENS Right 11/17/2015   Procedure: REMOVAL RETAINED LENS FROAGMENTS RIGHT EYE;  Surgeon: [name], MD;  Location: [place];  Service: Ophthalmology;  Laterality: Right;  . TONSILLECTOMY    . UPPER GASTROINTESTINAL ENDOSCOPY  11-03-15   Dr [name]  . VASECTOMY    . VIDEO ASSISTED THORACOSCOPY (VATS)/WEDGE RESECTION Left 06/04/2013   Procedure: VIDEO ASSISTED THORACOSCOPY (VATS)/WEDGE RESECTION;  Surgeon: [name], MD;  Location: [place];  Service: Thoracic;  Laterality: Left;  [name] VIDEO BRONCHOSCOPY N/A 06/04/2013   Procedure: VIDEO BRONCHOSCOPY;  Surgeon: [name], MD;  Location: [facility] OR;  Service: Thoracic;  Laterality: N/A;    FAMILY HISTORY: Family History  Problem Relation Age of Onset  . Stroke Mother   . Alzheimer's disease Mother   . Hypertension Sister   . Hyperlipidemia Sister   . Hypertension Brother   . Hyperlipidemia Brother   . Diabetes Brother     ADVANCED DIRECTIVES (Y/N):  N  HEALTH MAINTENANCE: Social History   Tobacco Use  . Smoking status: Former Smoker    Packs/day: 1.00    Years: 40.00    Pack years: 40.00    Types: Cigarettes    Quit date: 12/09/1998    Years since quitting: 20.5  . Smokeless tobacco: Never  Used  Substance Use Topics  . Alcohol use: No  . Drug use: No     Colonoscopy:  PAP:  Bone density:  Lipid panel:  Allergies  Allergen Reactions  . Latex Itching    Tape only- REACTED TO BANDAGE ON ABDOMEN  . Tape Itching    Surgical tapes     Current Outpatient Medications  Medication Sig Dispense Refill  . amLODipine (NORVASC) 2.5 MG tablet Take 1 tablet by mouth once daily (Patient taking differently: Take 2.5 mg by mouth daily. ) 90 tablet 3  . atorvastatin (LIPITOR) 40 MG tablet TAKE 1 TABLET BY MOUTH ONCE DAILY FOR CHOLESTEROL (Patient taking differently: Take 40 mg by mouth daily. ) 90 tablet 1  . b complex vitamins capsule Take 1 capsule by mouth daily. AM    . Calcium Carbonate-Vitamin D3 (CALCIUM 600/VITAMIN D) 600-400 MG-UNIT TABS Take 1 tablet by mouth daily.     . cholecalciferol (VITAMIN D3) 25 MCG (1000 UNIT) tablet Take 1,000 Units by mouth daily.    . Cinnamon 500 MG capsule Take 500 mg by mouth at bedtime. BEDTIME    . dexamethasone (DECADRON) 6 MG tablet Take 1 tablet (6 mg total) by mouth daily. (Patient not taking: Reported on 06/25/2019) 7 tablet 0  . fexofenadine (ALLEGRA) 180 MG tablet Take 180 mg by mouth daily.    . finasteride (PROSCAR) 5 MG tablet Take 1 tablet (5 mg total) by mouth daily. 90 tablet 1  . guaiFENesin-dextromethorphan (ROBITUSSIN DM) 100-10 MG/5ML syrup Take 10 mLs by mouth every 4 (four) hours as needed for cough. 118 mL 0  . Ipratropium-Albuterol (COMBIVENT) 20-100 MCG/ACT AERS respimat Inhale 1 puff into the lungs every 6 (six) hours. 4 g 0  . LANREOTIDE ACETATE [place] Inject 1 Dose into the skin every 30 (thirty) days.    [name] losartan (COZAAR) 100 MG tablet Take 1 tablet (100 mg total) by mouth daily. 90 tablet 1  . Multiple Vitamins-Minerals (MULTIVITAMIN WITH MINERALS) tablet Take 1 tablet by mouth daily. AM    . polycarbophil (FIBERCON) 625 MG tablet Take 625 mg by mouth 2 (two) times daily. AM    . polyethylene glycol (MIRALAX /  GLYCOLAX) packet Take 17 g by mouth daily as needed.     . tamsulosin (FLOMAX) 0.4 MG CAPS capsule Take 1 capsule (0.4 mg total) by mouth daily. (Patient taking differently: Take 0.4 mg by mouth at bedtime. ) 90 capsule 1  . vitamin C (ASCORBIC ACID) 500 MG tablet Take 500 mg by mouth daily. AM    . zinc sulfate 220 (50 Zn) MG capsule Take 1 capsule (220 mg total) by mouth daily. 30 capsule 0  No current facility-administered medications for this visit.   Facility-Administered Medications Ordered in Other Visits  Medication Dose Route Frequency Provider Last Rate Last Admin  . lanreotide acetate (SOMATULINE DEPOT) injection 120 mg  120 mg Subcutaneous Once Lloyd Huger, MD        OBJECTIVE: There were no vitals filed for this visit.   There is no height or weight on file to calculate BMI.    ECOG FS:0 - Asymptomatic  LAB RESULTS:  Lab Results  Component Value Date   NA 138 06/17/2019   K 4.2 06/17/2019   CL 104 06/17/2019   CO2 25 06/17/2019   GLUCOSE 147 (H) 06/17/2019   BUN 31 (H) 06/17/2019   CREATININE 0.85 06/17/2019   CALCIUM 8.5 (L) 06/17/2019   PROT 5.7 (L) 06/17/2019   ALBUMIN 2.8 (L) 06/17/2019   AST 25 06/17/2019   ALT 41 06/17/2019   ALKPHOS 136 (H) 06/17/2019   BILITOT 0.9 06/17/2019   GFRNONAA >60 06/17/2019   GFRAA >60 06/17/2019    Lab Results  Component Value Date   WBC 23.6 (H) 06/17/2019   NEUTROABS 19.4 (H) 06/17/2019   HGB 13.4 06/17/2019   HCT 40.3 06/17/2019   MCV 91.2 06/17/2019   PLT 221 06/17/2019     STUDIES: DG Chest 2 View  Result Date: 06/07/2019 CLINICAL DATA:  Cough EXAM: CHEST - 2 VIEW COMPARISON:  2019 FINDINGS: The heart size and mediastinal contours are within normal limits. Postoperative changes at the left hilum. No new consolidation or edema. Right mid lung calcified granuloma. Degenerative changes of the spine. IMPRESSION: No acute process in the chest Electronically Signed   By: Macy Mis M.D.   On: 06/07/2019  11:31   CT ANGIO CHEST PE W OR WO CONTRAST  Result Date: 06/11/2019 CLINICAL DATA:  Shortness of breath.  COVID positive. EXAM: CT ANGIOGRAPHY CHEST WITH CONTRAST TECHNIQUE: Multidetector CT imaging of the chest was performed using the standard protocol during bolus administration of intravenous contrast. Multiplanar CT image reconstructions and MIPs were obtained to evaluate the vascular anatomy. CONTRAST:  63mL OMNIPAQUE IOHEXOL 350 MG/ML SOLN COMPARISON:  06/27/2016 FINDINGS: Cardiovascular: Normal heart size. No pericardial effusion. Atherosclerotic calcification of the aorta and coronaries. No pulmonary artery filling defect Mediastinum/Nodes: Negative for adenopathy or mass. Lungs/Pleura: Left upper lobectomy for bronchial carcinoid per chart. There is ground-glass opacity with a subpleural predilection in the right more than left lung consistent with history of COVID 19 positivity. Also, there is a irregular lobulated endobronchial mass at the level of the distal left mainstem bronchus, 2 cm in length. Bronchus appears somewhat thin along the esophageal margin but no clear esophageal fistulization. Upper Abdomen: Cholelithiasis. History of treated liver metastases, reference MRI 03/15/2019. Musculoskeletal: Spondylosis. Rounded sclerotic focus in the T11 vertebral body is stable from 2019. Review of the MIP images confirms the above findings. IMPRESSION: 1. Bilateral ground-glass pneumonia consistent with COVID-19 positivity. 2. 2 cm left main endobronchial mass close to a left upper lobectomy margin, presumably local recurrence of bronchial carcinoid. Electronically Signed   By: Monte Fantasia M.D.   On: 06/11/2019 04:28   DG Chest Port 1 View  Result Date: 06/10/2019 CLINICAL DATA:  Fever.  COVID-19 pneumonia. EXAM: PORTABLE CHEST 1 VIEW COMPARISON:  June 07, 2019 FINDINGS: Been significant interval development diffuse airspace opacities throughout the right lung field with focal areas of  consolidation involving the right lower lobe. There is an airspace opacity at the left lung base. There is no  pneumothorax. No large pleural effusion. Aortic calcifications are noted. The heart size is normal. IMPRESSION: Significant interval development of bilateral airspace opacities, greatest on the right, concerning for multifocal pneumonia (viral or bacterial). Electronically Signed   By: Constance Holster M.D.   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  PLAN:    1. 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 Patient underwent left upper lobe lobectomy in [place] on June 04, 2013. Final pathology results revealed a well differentiated neuroendocrine tumor with positive bronchial margins. Patient did not receive adjuvant XRT or chemotherapy at that time.  Patient's most recent abdominal MRI on March 15, 2019 reviewed independently with stable to decrease in size of known lesions in patient's liver.  His most recent Y 90 ablation occurred on April 28, 2018.  Patient's chromogranin a levels are elevated, but relatively stable ranging from 213-261.  Patient has been instructed to keep his previously scheduled follow-up appointment for further evaluation and continuation of 120 mg subcutaneous lanreotide. 2.  Left mainstem endobronchial mass: Mass is closer to the left upper lobe lobectomy margin, therefore likely a local recurrence of disease given his positive margins after surgery in January 2015.  To be sure, patient was given a referral to pulmonary for biopsy. 3.  Covid: Patient was admitted to the hospital for 7 days and now is slowly recovering.  Return to clinic as above.  I provided 25 minutes of non face-to-face telephone visit time during this encounter which included chart review, counseling, and coordination of care  as documented above.   Patient expressed understanding and was in agreement with this plan. He also understands that He can call clinic at any time with any questions, concerns, or complaints.   Cancer Staging Neuroendocrine carcinoma of lung [facility]) Staging form: Lung, AJCC 8th Edition - Clinical stage from 12/28/2016: Stage IVA (cT2a, cN0, cM1b) - Signed by Lloyd Huger, MD on 12/28/2016   Lloyd Huger, MD   06/28/2019 2:09 PM

## 2019-06-28 NOTE — Progress Notes (Signed)
Tumor Board Documentation  Jason Malone was presented by Dr Grayland Ormond at our Tumor Board on 06/28/2019, which included representatives from medical oncology, radiation oncology, navigation, pathology, radiology, surgical, internal medicine, genetics, pulmonology, palliative care, research.  Jason Malone currently presents as a current patient, for discussion with history of the following treatments: surgical intervention(s)(Y90 Ablation).  Additionally, we reviewed previous medical and familial history, history of present illness, and recent lab results along with all available histopathologic and imaging studies. The tumor board considered available treatment options and made the following recommendations: Active surveillance Refer to Dr Theresia Bough for Cryotherapy Ablation and possibly to Radiation Oncology  The following procedures/referrals were also placed: No orders of the defined types were placed in this encounter.   Clinical Trial Status: not discussed   Staging used: Pathologic Stage  AJCC Staging:       Group: Stage 4A Neuroendocrine Tumor of lung with liver mets   National site-specific guidelines   were discussed with respect to the case.  Tumor board is a meeting of clinicians from various specialty areas who evaluate and discuss patients for whom a multidisciplinary approach is being considered. Final determinations in the plan of care are those of the provider(s). The responsibility for follow up of recommendations given during tumor board is that of the provider.   Today's extended care, comprehensive team conference, Jason Malone was not present for the discussion and was not examined.   Multidisciplinary Tumor Board is a multidisciplinary case peer review process.  Decisions discussed in the Multidisciplinary Tumor Board reflect the opinions of the specialists present at the conference without having examined the patient.  Ultimately, treatment and diagnostic decisions rest with the  primary provider(s) and the patient.

## 2019-06-30 ENCOUNTER — Emergency Department
Admission: EM | Admit: 2019-06-30 | Discharge: 2019-06-30 | Disposition: A | Discharge status: Home / Self Care | Payer: Medicare Other | Attending: Student | Admitting: Student

## 2019-06-30 ENCOUNTER — Encounter: Payer: Self-pay | Admitting: Intensive Care

## 2019-06-30 ENCOUNTER — Other Ambulatory Visit: Payer: Self-pay

## 2019-06-30 DIAGNOSIS — I2510 Atherosclerotic heart disease of native coronary artery without angina pectoris: Secondary | ICD-10-CM | POA: Insufficient documentation

## 2019-06-30 DIAGNOSIS — Z91040 Latex allergy status: Secondary | ICD-10-CM | POA: Diagnosis not present

## 2019-06-30 DIAGNOSIS — Z8505 Personal history of malignant neoplasm of liver: Secondary | ICD-10-CM | POA: Diagnosis not present

## 2019-06-30 DIAGNOSIS — Z85118 Personal history of other malignant neoplasm of bronchus and lung: Secondary | ICD-10-CM | POA: Diagnosis not present

## 2019-06-30 DIAGNOSIS — R300 Dysuria: Secondary | ICD-10-CM | POA: Diagnosis not present

## 2019-06-30 DIAGNOSIS — Z87891 Personal history of nicotine dependence: Secondary | ICD-10-CM | POA: Insufficient documentation

## 2019-06-30 DIAGNOSIS — I10 Essential (primary) hypertension: Secondary | ICD-10-CM | POA: Diagnosis not present

## 2019-06-30 DIAGNOSIS — Z79899 Other long term (current) drug therapy: Secondary | ICD-10-CM | POA: Diagnosis not present

## 2019-06-30 DIAGNOSIS — R339 Retention of urine, unspecified: Secondary | ICD-10-CM | POA: Diagnosis present

## 2019-06-30 DIAGNOSIS — E119 Type 2 diabetes mellitus without complications: Secondary | ICD-10-CM | POA: Insufficient documentation

## 2019-06-30 LAB — CBC WITH DIFFERENTIAL/PLATELET
Abs Immature Granulocytes: 0.24 10*3/uL — ABNORMAL HIGH (ref 0.00–0.07)
Basophils Absolute: 0.1 10*3/uL (ref 0.0–0.1)
Basophils Relative: 0 %
Eosinophils Absolute: 0 10*3/uL (ref 0.0–0.5)
Eosinophils Relative: 0 %
HCT: 40 % (ref 39.0–52.0)
Hemoglobin: 13.3 g/dL (ref 13.0–17.0)
Immature Granulocytes: 1 %
Lymphocytes Relative: 2 %
Lymphs Abs: 0.5 10*3/uL — ABNORMAL LOW (ref 0.7–4.0)
MCH: 30.4 pg (ref 26.0–34.0)
MCHC: 33.3 g/dL (ref 30.0–36.0)
MCV: 91.5 fL (ref 80.0–100.0)
Monocytes Absolute: 2.1 10*3/uL — ABNORMAL HIGH (ref 0.1–1.0)
Monocytes Relative: 9 %
Neutro Abs: 20.3 10*3/uL — ABNORMAL HIGH (ref 1.7–7.7)
Neutrophils Relative %: 88 %
Platelets: 181 10*3/uL (ref 150–400)
RBC: 4.37 MIL/uL (ref 4.22–5.81)
RDW: 14.8 % (ref 11.5–15.5)
WBC: 23.2 10*3/uL — ABNORMAL HIGH (ref 4.0–10.5)
nRBC: 0 % (ref 0.0–0.2)

## 2019-06-30 LAB — COMPREHENSIVE METABOLIC PANEL
ALT: 35 U/L (ref 0–44)
AST: 26 U/L (ref 15–41)
Albumin: 2.4 g/dL — ABNORMAL LOW (ref 3.5–5.0)
Alkaline Phosphatase: 111 U/L (ref 38–126)
Anion gap: 10 (ref 5–15)
BUN: 13 mg/dL (ref 8–23)
CO2: 25 mmol/L (ref 22–32)
Calcium: 8.5 mg/dL — ABNORMAL LOW (ref 8.9–10.3)
Chloride: 98 mmol/L (ref 98–111)
Creatinine, Ser: 0.9 mg/dL (ref 0.61–1.24)
GFR calc Af Amer: >60 mL/min (ref >60)
GFR calc non Af Amer: >60 mL/min (ref >60)
Glucose, Bld: 231 mg/dL — ABNORMAL HIGH (ref 70–99)
Potassium: 4.3 mmol/L (ref 3.5–5.1)
Sodium: 133 mmol/L — ABNORMAL LOW (ref 135–145)
Total Bilirubin: 0.9 mg/dL (ref 0.3–1.2)
Total Protein: 5.9 g/dL — ABNORMAL LOW (ref 6.5–8.1)

## 2019-06-30 LAB — URINALYSIS, COMPLETE (UACMP) WITH MICROSCOPIC
Bacteria, UA: NONE SEEN
Bilirubin Urine: NEGATIVE
Glucose, UA: NEGATIVE mg/dL
Hgb urine dipstick: NEGATIVE
Ketones, ur: NEGATIVE mg/dL
Leukocytes,Ua: NEGATIVE
Nitrite: NEGATIVE
Protein, ur: NEGATIVE mg/dL
Specific Gravity, Urine: 1.016 (ref 1.005–1.030)
pH: 6 (ref 5.0–8.0)

## 2019-06-30 MED ORDER — PHENAZOPYRIDINE HCL 100 MG PO TABS
100.0000 mg | ORAL_TABLET | Freq: Once | ORAL | Status: AC
  Administered 2019-06-30: 100 mg via ORAL
  Filled 2019-06-30: qty 1

## 2019-06-30 MED ORDER — PHENAZOPYRIDINE HCL 100 MG PO TABS: 100.0000 mg | ORAL_TABLET | Freq: Two times a day (BID) | ORAL | 0 refills | Status: DC | PRN

## 2019-06-30 NOTE — ED Notes (Signed)
Bladder scan revealed 258ml - reported to Gae Dry FNP

## 2019-06-30 NOTE — Discharge Instructions (Signed)
Please return to the emergency department for symptoms that change or worsen if you are unable to see primary care or the urologist.  Medication given today will cause your urine to be a bright orange color.

## 2019-06-30 NOTE — ED Notes (Signed)
Pt is resting comfortably, in no apparent acute distress and is not expressing any needs at this time.  Urinal at bedside; pt is able to use independently.  Pt reports burning when he urinates; he urinated twice and produced 50 ml of urine.

## 2019-06-30 NOTE — ED Notes (Addendum)
Pt transported to lobby on 2L, became winded upon moving to wheelchair and O2 sats dropped to 80s, recovered quickly to 90s on 2L.  Pt reports this is normal and his family is bringing oxygen with them.  MD is aware.

## 2019-06-30 NOTE — ED Notes (Signed)
Was not informed that pt was in room  Assessed pt at this time Pt reports that yesterday he started with urinary frequency/urgency/and pain with "dribbling" and inability to hold urine - Pt reports that he does have an enlarged prostate Denies pain at this time

## 2019-06-30 NOTE — ED Provider Notes (Signed)
[facility] Emergency Department Provider Note ____________________________________________   First MD Initiated Contact with Patient 06/30/19 1743     (approximate)  I have reviewed the triage vital signs and the nursing notes.   HISTORY  Chief Complaint Urinary Frequency  HPI Jason Malone is a 79 y.o. male presenting to the emergency department for evaluation of dysuria and urinary retention.  Patient states the symptoms started yesterday afternoon.  He takes finasteride and tamsulosin daily but has not had any relief.  He was recently hospitalized due to COVID-19.  He states those symptoms have pretty much resolved.     Past Medical History:  Diagnosis Date  . Allergic rhinitis   . Benign prostatic hypertrophy with lower urinary tract symptoms (LUTS)   . Chest pain    a. 05/2013 MV: EF 70%, no ischemia.  . Diverticulitis    DIVERTICULOSIS  . Enlarged prostate   . Essential hypertension    CONTROLLED ON MEDS  . Full dentures    upper and lower  . H/O hypokalemia   . Hx of atrial fibrillation without current medication    CARDIOLOGIST-DR [facility]  . 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 Hyperlipemia   . Hypomagnesemia   . IFG (impaired fasting glucose)   . Liver cancer ([place])   . Liver cancer ([place]) 12/08/2016  . Liver mass, left lobe 08/28/2016   Probably hemangioma; will get MRI of liver, refer to GI  . Lung cancer ([place])    a. carcinoid, left lung, Stage 1b (T2a, N0, cM0);  b. 05/2013 s/p VATS & LULobectomy.  . Monocytosis   . Osteopenia   . Paroxysmal atrial flutter (HCC)    a. 03/2013->no recurrence;  b. CHA2DS2VASc = 2-->not currently on anticoagulation;  c. 05/2012 Echo: EF 55-60%, normal RV.  [name] Wears glasses     Patient Active Problem List   Diagnosis Date Noted  . Diabetes mellitus type 2, controlled, with complications ([place]) 10/93/2355  . HLD (hyperlipidemia) 06/14/2019  . 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 Acute respiratory failure with hypoxia ([place]) 06/11/2019  . Elevated LFTs 06/11/2019  . Pneumonia due to COVID-19 virus 06/10/2019  . Contracture of palmar fascia 10/11/2018  . Microalbuminuria due to type 2 diabetes mellitus ([place]) 04/12/2018  . Type 2 diabetes mellitus ([place]) 04/10/2018  . Osteopenia determined by x-ray 01/07/2017  . Neuroendocrine carcinoma of lung ([place]) 12/28/2016  . Low back pain 12/17/2016  . Liver mass, left lobe 08/28/2016  . 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 History of lung cancer 06/29/2016  . Aortic atherosclerosis ([place]) 04/06/2016  . Coronary artery disease involving native heart without angina pectoris 04/06/2016  . History of colonic polyps 10/28/2015  . Medication monitoring encounter 08/15/2015  . Paroxysmal atrial flutter ([place])   . Elevated serum alkaline phosphatase level 03/05/2015  . Allergic rhinitis   . BPH without urinary obstruction   . Hyperlipidemia 02/13/2014  . Sinus bradycardia 07/18/2013  . S/P lobectomy of lung 06/04/2013  . Atrial flutter ([place]) 04/09/2013  . Smoking history 04/09/2013  . Essential hypertension 04/09/2013  . Rectal bleeding 12/15/2012    Past Surgical History:  Procedure Laterality Date  . BRONCHOSCOPY     04/06/2013  . CARDIOVASCULAR STRESS TEST  05/2013   a. No evidence of ischemia or infarct, EF 70%, no WMAs  . CATARACT EXTRACTION W/PHACO Left 07/14/2015   Procedure: CATARACT EXTRACTION PHACO AND INTRAOCULAR LENS PLACEMENT (  [place]);  Surgeon: [name], MD;  Location: [place];  Service: Ophthalmology;  Laterality: Left;  . CATARACT EXTRACTION W/PHACO Right 10/01/2015   Procedure: CATARACT EXTRACTION PHACO AND INTRAOCULAR LENS PLACEMENT (IOC);  Surgeon: [name], MD;  Location: [place];  Service: Ophthalmology;  Laterality: Right;  . COLONOSCOPY W/ POLYPECTOMY     bleed after-had to go to surgery to stop bleeding via colonoscopy  .  COLONOSCOPY WITH PROPOFOL N/A 11/19/2015   Procedure: COLONOSCOPY WITH PROPOFOL;  Surgeon: [name], MD;  Location: [facility] ENDOSCOPY;  Service: Endoscopy;  Laterality: N/A;  . DUPUYTREN CONTRACTURE RELEASE  12/15/2011   Procedure: DUPUYTREN CONTRACTURE RELEASE;  Surgeon: [name], MD;  Location: [place];  Service: Orthopedics;  Laterality: Left;  Fasciotomy left ring finger dupuytrens  . FASCIECTOMY Right 11/07/2018   Procedure: SEGMENTAL FASCIECTOMY RIGHT RING FINGER;  Surgeon: Daryll Brod, MD;  Location: [place];  Service: Orthopedics;  Laterality: Right;  AXILLARY BLOCK  . IR ANGIOGRAM SELECTIVE EACH ADDITIONAL VESSEL  07/26/2017  . IR ANGIOGRAM SELECTIVE EACH ADDITIONAL VESSEL  07/26/2017  . IR ANGIOGRAM SELECTIVE EACH ADDITIONAL VESSEL  07/26/2017  . IR ANGIOGRAM SELECTIVE EACH ADDITIONAL VESSEL  07/26/2017  . IR ANGIOGRAM SELECTIVE EACH ADDITIONAL VESSEL  07/26/2017  . IR ANGIOGRAM SELECTIVE EACH ADDITIONAL VESSEL  08/11/2017  . IR ANGIOGRAM SELECTIVE EACH ADDITIONAL VESSEL  04/28/2018  . IR ANGIOGRAM VISCERAL SELECTIVE  07/26/2017  . IR ANGIOGRAM VISCERAL SELECTIVE  08/11/2017  . IR ANGIOGRAM VISCERAL SELECTIVE  04/28/2018  . IR EMBO ARTERIAL NOT HEMORR HEMANG INC GUIDE ROADMAPPING  07/26/2017  . IR EMBO TUMOR ORGAN ISCHEMIA INFARCT INC GUIDE ROADMAPPING  08/11/2017  . IR EMBO TUMOR ORGAN ISCHEMIA INFARCT INC GUIDE ROADMAPPING  04/28/2018  . IR RADIOLOGIST EVAL & MGMT  07/07/2017  . IR RADIOLOGIST EVAL & MGMT  09/07/2017  . IR RADIOLOGIST EVAL & MGMT  12/21/2017  . IR RADIOLOGIST EVAL & MGMT  03/22/2018  . IR RADIOLOGIST EVAL & MGMT  06/08/2018  . IR RADIOLOGIST EVAL & MGMT  11/01/2018  . IR RADIOLOGIST EVAL & MGMT  03/21/2019  . IR US GUIDE VASC ACCESS RIGHT  07/26/2017  . IR US GUIDE VASC ACCESS RIGHT  08/11/2017  . IR US GUIDE VASC ACCESS RIGHT  04/28/2018  . LUNG LOBECTOMY  06/10/13   upper left lung  . MULTIPLE TOOTH EXTRACTIONS    . REMOVAL RETAINED  LENS Right 11/17/2015   Procedure: REMOVAL RETAINED LENS FROAGMENTS RIGHT EYE;  Surgeon: [name], MD;  Location: [place];  Service: Ophthalmology;  Laterality: Right;  . TONSILLECTOMY    . UPPER GASTROINTESTINAL ENDOSCOPY  11-03-15   Dr [name]  . VASECTOMY    . VIDEO ASSISTED THORACOSCOPY (VATS)/WEDGE RESECTION Left 06/04/2013   Procedure: VIDEO ASSISTED THORACOSCOPY (VATS)/WEDGE RESECTION;  Surgeon: [name], MD;  Location: [place];  Service: Thoracic;  Laterality: Left;  [name] VIDEO BRONCHOSCOPY N/A 06/04/2013   Procedure: VIDEO BRONCHOSCOPY;  Surgeon: [name], MD;  Location[facility] OR;  Service: Thoracic;  Laterality: N/A;    Prior to Admission medications   Medication Sig Start Date End Date Taking? Authorizing Provider  amLODipine (NORVASC) 2.5 MG tablet Take 1 tablet by mouth once daily Patient taking differently: Take 2.5 mg by mouth daily.  01/01/19  Yes Gollan, Kathlene November, MD  atorvastatin (LIPITOR) 40 MG tablet TAKE 1 TABLET BY MOUTH ONCE DAILY FOR CHOLESTEROL Patient taking differently: Take 40 mg by mouth  daily.  04/11/19  Yes Delsa Grana, PA-C  Calcium Carbonate-Vitamin D3 (CALCIUM 600/VITAMIN D) 600-400 MG-UNIT TABS Take 1 tablet by mouth daily.    Yes [provider]  cholecalciferol (VITAMIN D3) 25 MCG (1000 UNIT) tablet Take 1,000 Units by mouth daily.   Yes [provider]  Cinnamon 500 MG capsule Take 500 mg by mouth at bedtime. BEDTIME   Yes [provider]  finasteride (PROSCAR) 5 MG tablet Take 1 tablet (5 mg total) by mouth daily. 04/11/19  Yes Delsa Grana, PA-C  guaiFENesin-dextromethorphan (ROBITUSSIN DM) 100-10 MG/5ML syrup Take 10 mLs by mouth every 4 (four) hours as needed for cough. 06/17/19  Yes Allie Bossier, MD  Ipratropium-Albuterol (COMBIVENT) 20-100 MCG/ACT AERS respimat Inhale 1 puff into the lungs every 6 (six) hours. 06/17/19  Yes Allie Bossier, MD  LANREOTIDE ACETATE [place] Inject 1 Dose into the skin  every 30 (thirty) days.   Yes [provider]  losartan (COZAAR) 100 MG tablet Take 1 tablet (100 mg total) by mouth daily. 04/11/19  Yes Delsa Grana, PA-C  Multiple Vitamins-Minerals (MULTIVITAMIN WITH MINERALS) tablet Take 1 tablet by mouth daily. AM   Yes [provider]  polycarbophil (FIBERCON) 625 MG tablet Take 625 mg by mouth 2 (two) times daily. AM   Yes [provider]  tamsulosin (FLOMAX) 0.4 MG CAPS capsule Take 1 capsule (0.4 mg total) by mouth daily. Patient taking differently: Take 0.4 mg by mouth at bedtime.  04/11/19  Yes Delsa Grana, PA-C  vitamin C (ASCORBIC ACID) 500 MG tablet Take 500 mg by mouth daily. AM   Yes [provider]  zinc sulfate 220 (50 Zn) MG capsule Take 1 capsule (220 mg total) by mouth daily. 06/18/19  Yes Allie Bossier, MD  phenazopyridine (PYRIDIUM) 100 MG tablet Take 1 tablet (100 mg total) by mouth 2 (two) times daily as needed for pain. 06/30/19 06/29/20  Victorino Dike, FNP    Allergies Latex and Tape  Family History  Problem Relation Age of Onset  . Stroke Mother   . Alzheimer's disease Mother   . Hypertension Sister   . Hyperlipidemia Sister   . Hypertension Brother   . Hyperlipidemia Brother   . Diabetes Brother     Social History Social History   Tobacco Use  . Smoking status: Former Smoker    Packs/day: 1.00    Years: 40.00    Pack years: 40.00    Types: Cigarettes    Quit date: 12/09/1998    Years since quitting: 20.5  . Smokeless tobacco: Never Used  Substance Use Topics  . Alcohol use: No  . Drug use: No    Review of Systems  Constitutional: No fever/chills Eyes: No visual changes. ENT: No sore throat. Cardiovascular: Denies chest pain. Respiratory: Positive for shortness of breath. Gastrointestinal: No abdominal pain.  No nausea, no vomiting. Genitourinary: Positive for dysuria.  Positive for urinary retention. Musculoskeletal: Negative for back pain. Skin: Negative for rash.  Neurological: Negative for headaches, focal weakness or numbness. ____________________________________________   PHYSICAL EXAM:  VITAL SIGNS: ED Triage Vitals  Enc Vitals Group     BP 06/30/19 1524 117/63     Pulse Rate 06/30/19 1524 100     Resp 06/30/19 1524 20     Temp 06/30/19 1524 98.3 F (36.8 C)     Temp Source 06/30/19 1524 Oral     SpO2 06/30/19 1524 98 %     Weight 06/30/19 1521 145 lb (65.8  kg)     Height 06/30/19 1521 5\' 4"  (1.626 m)     Head Circumference --      Peak Flow --      Pain Score 06/30/19 1521 0     Pain Loc --      Pain Edu? --      Excl. in [place]? --     Constitutional: Alert and oriented. Well appearing and in no acute distress. Eyes: Conjunctivae are normal. Head: Atraumatic. Nose: No congestion/rhinnorhea. Mouth/Throat: Mucous membranes are moist.  Oropharynx non-erythematous. Neck: No stridor.   Hematological/Lymphatic/Immunilogical: No cervical lymphadenopathy. Cardiovascular: Normal rate, regular rhythm. Grossly normal heart sounds.  Good peripheral circulation. Respiratory: Normal respiratory effort.  No retractions.  Wheezing throughout. Gastrointestinal: Soft and nontender. No distention. No abdominal bruits. No CVA tenderness. Genitourinary:  Musculoskeletal: No lower extremity tenderness nor edema.  No joint effusions. Neurologic:  Normal speech and language. No gross focal neurologic deficits are appreciated. No gait instability. Skin:  Skin is warm, dry and intact. No rash noted. Psychiatric: Mood and affect are normal. Speech and behavior are normal.  ____________________________________________   LABS (all labs ordered are listed, but only abnormal results are displayed)  Labs Reviewed  URINALYSIS, COMPLETE (UACMP) WITH MICROSCOPIC - Abnormal; Notable for the following components:      Result Value   Color, Urine YELLOW (*)    APPearance CLEAR (*)    All other components within normal limits  CBC WITH DIFFERENTIAL/PLATELET -  Abnormal; Notable for the following components:   WBC 23.2 (*)    Neutro Abs 20.3 (*)    Lymphs Abs 0.5 (*)    Monocytes Absolute 2.1 (*)    Abs Immature Granulocytes 0.24 (*)    All other components within normal limits  COMPREHENSIVE METABOLIC PANEL - Abnormal; Notable for the following components:   Sodium 133 (*)    Glucose, Bld 231 (*)    Calcium 8.5 (*)    Total Protein 5.9 (*)    Albumin 2.4 (*)    All other components within normal limits   ____________________________________________  EKG  Not indicated ____________________________________________  RADIOLOGY  Official radiology report(s): No results found.  ____________________________________________   PROCEDURES  Procedure(s) performed (including Critical Care):  Procedures  ____________________________________________   INITIAL IMPRESSION / ASSESSMENT AND PLAN     79 year old male presenting to the emergency department for treatment and evaluation of dysuria and urinary retention.  Symptoms started yesterday evening.  See HPI for further details.  Plan will be to do a bladder scan and proceed from there.  On exam, he has no abdominal tenderness.  He does seem to be somewhat short of breath and is wheezing throughout.  He reports that this is his baseline and has recently been treated while having COVID-19.  DIFFERENTIAL DIAGNOSIS  Acute cystitis, urinary retention related to meds, urinary retention unspecified  ED COURSE  On review of labs, white blood cell count is elevated to 23.2.  This is most likely secondary to recent COVID-19 infection.  In comparison to labs from almost 2 weeks ago, it is lower.  Today, he has no new source for infection.  Plan will be for discharge home with pyridium and follow up with urology. He is to return to the ER for symptoms that change or worsen if unable to schedule an appointment. ____________________________________________   FINAL CLINICAL IMPRESSION(S) / ED  DIAGNOSES  Final diagnoses:  Dysuria     ED Discharge Orders  Ordered    phenazopyridine (PYRIDIUM) 100 MG tablet  2 times daily PRN     06/30/19 1948           Jason Malone was evaluated in Emergency Department on 06/30/2019 for the symptoms described in the history of present illness. He was evaluated in the context of the global COVID-19 pandemic, which necessitated consideration that the patient might be at risk for infection with the SARS-CoV-2 virus that causes COVID-19. Institutional protocols and algorithms that pertain to the evaluation of patients at risk for COVID-19 are in a state of rapid change based on information released by regulatory bodies including the CDC and federal and state organizations. These policies and algorithms were followed during the patient's care in the ED.   Note:  This document was prepared using Dragon voice recognition software and may include unintentional dictation errors.   Victorino Dike, FNP 06/30/19 1952    Lilia Pro., MD 07/01/19 1249

## 2019-06-30 NOTE — ED Notes (Signed)
Blood drawn and sent to lab by student Ubaldo Glassing

## 2019-06-30 NOTE — ED Notes (Signed)
MD at bedside. 

## 2019-06-30 NOTE — ED Triage Notes (Addendum)
Patient arrived by EMS from home for being unable to urinate since yesterday afternoon. C/o burning during urination. Diagnosed covid + January 31st. A&O x4. Patient wears oxygen at home as needed, but reports whenever he is up and moving he needs it. HX upper left lung removal, enlarged prostate, liver cancer. Reports getting chemo shot once a month. Patient denies feeling more SOB than normal and denies CP

## 2019-07-02 ENCOUNTER — Inpatient Hospital Stay: Payer: Medicare Other

## 2019-07-02 ENCOUNTER — Other Ambulatory Visit: Payer: Self-pay | Admitting: Emergency Medicine

## 2019-07-02 ENCOUNTER — Telehealth: Payer: Self-pay

## 2019-07-02 ENCOUNTER — Inpatient Hospital Stay: Payer: Medicare Other | Admitting: Oncology

## 2019-07-02 DIAGNOSIS — C7A8 Other malignant neuroendocrine tumors: Secondary | ICD-10-CM

## 2019-07-02 NOTE — Telephone Encounter (Signed)
Copied from [place] [phone]. Topic: General - Inquiry >> Jul 02, 2019  8:28 AM Richardo Priest, NT wrote: Reason for CRM: Patient's wife called in stating she would like to speak with the nurse in regards to husband having covid-19 recently and now is having an issue urinating. Wife expressed that he was released from hospital Saturday but has an appointment today with cardiologist. Pt's wife wants to know if it is safe to go and what to do in regards to urination. Advised them to get an appointment, but wife requesting a call from nurse prior to 1:00. Please advise.

## 2019-07-03 ENCOUNTER — Inpatient Hospital Stay (HOSPITAL_COMMUNITY)
Admission: EM | Admit: 2019-07-03 | Discharge: 2019-07-06 | DRG: 193 | Disposition: A | Discharge status: Home Health | Payer: Medicare Other | Attending: Internal Medicine | Admitting: Internal Medicine

## 2019-07-03 ENCOUNTER — Ambulatory Visit (INDEPENDENT_AMBULATORY_CARE_PROVIDER_SITE_OTHER): Payer: Medicare Other | Admitting: Family Medicine

## 2019-07-03 ENCOUNTER — Emergency Department (HOSPITAL_COMMUNITY): Payer: Medicare Other

## 2019-07-03 ENCOUNTER — Encounter (HOSPITAL_COMMUNITY): Payer: Self-pay | Admitting: Emergency Medicine

## 2019-07-03 ENCOUNTER — Other Ambulatory Visit: Payer: Self-pay

## 2019-07-03 DIAGNOSIS — C787 Secondary malignant neoplasm of liver and intrahepatic bile duct: Secondary | ICD-10-CM | POA: Diagnosis present

## 2019-07-03 DIAGNOSIS — R32 Unspecified urinary incontinence: Secondary | ICD-10-CM | POA: Diagnosis present

## 2019-07-03 DIAGNOSIS — M8580 Other specified disorders of bone density and structure, unspecified site: Secondary | ICD-10-CM | POA: Diagnosis present

## 2019-07-03 DIAGNOSIS — J189 Pneumonia, unspecified organism: Secondary | ICD-10-CM | POA: Diagnosis present

## 2019-07-03 DIAGNOSIS — Z8249 Family history of ischemic heart disease and other diseases of the circulatory system: Secondary | ICD-10-CM

## 2019-07-03 DIAGNOSIS — I10 Essential (primary) hypertension: Secondary | ICD-10-CM | POA: Diagnosis present

## 2019-07-03 DIAGNOSIS — Z8349 Family history of other endocrine, nutritional and metabolic diseases: Secondary | ICD-10-CM

## 2019-07-03 DIAGNOSIS — E871 Hypo-osmolality and hyponatremia: Secondary | ICD-10-CM | POA: Diagnosis present

## 2019-07-03 DIAGNOSIS — N401 Enlarged prostate with lower urinary tract symptoms: Secondary | ICD-10-CM | POA: Diagnosis present

## 2019-07-03 DIAGNOSIS — Z823 Family history of stroke: Secondary | ICD-10-CM | POA: Diagnosis not present

## 2019-07-03 DIAGNOSIS — J1282 Pneumonia due to coronavirus disease 2019: Secondary | ICD-10-CM

## 2019-07-03 DIAGNOSIS — T380X5A Adverse effect of glucocorticoids and synthetic analogues, initial encounter: Secondary | ICD-10-CM | POA: Diagnosis not present

## 2019-07-03 DIAGNOSIS — E872 Acidosis, unspecified: Secondary | ICD-10-CM

## 2019-07-03 DIAGNOSIS — R338 Other retention of urine: Secondary | ICD-10-CM | POA: Diagnosis not present

## 2019-07-03 DIAGNOSIS — E1165 Type 2 diabetes mellitus with hyperglycemia: Secondary | ICD-10-CM | POA: Diagnosis not present

## 2019-07-03 DIAGNOSIS — Z87891 Personal history of nicotine dependence: Secondary | ICD-10-CM | POA: Diagnosis not present

## 2019-07-03 DIAGNOSIS — U071 COVID-19: Secondary | ICD-10-CM | POA: Diagnosis present

## 2019-07-03 DIAGNOSIS — J440 Chronic obstructive pulmonary disease with acute lower respiratory infection: Secondary | ICD-10-CM | POA: Diagnosis present

## 2019-07-03 DIAGNOSIS — N390 Urinary tract infection, site not specified: Secondary | ICD-10-CM | POA: Diagnosis present

## 2019-07-03 DIAGNOSIS — Z820 Family history of epilepsy and other diseases of the nervous system: Secondary | ICD-10-CM

## 2019-07-03 DIAGNOSIS — E785 Hyperlipidemia, unspecified: Secondary | ICD-10-CM | POA: Diagnosis present

## 2019-07-03 DIAGNOSIS — I480 Paroxysmal atrial fibrillation: Secondary | ICD-10-CM | POA: Diagnosis present

## 2019-07-03 DIAGNOSIS — Z85118 Personal history of other malignant neoplasm of bronchus and lung: Secondary | ICD-10-CM

## 2019-07-03 DIAGNOSIS — N400 Enlarged prostate without lower urinary tract symptoms: Secondary | ICD-10-CM

## 2019-07-03 DIAGNOSIS — J9690 Respiratory failure, unspecified, unspecified whether with hypoxia or hypercapnia: Secondary | ICD-10-CM

## 2019-07-03 DIAGNOSIS — Z5329 Procedure and treatment not carried out because of patient's decision for other reasons: Secondary | ICD-10-CM

## 2019-07-03 DIAGNOSIS — J9601 Acute respiratory failure with hypoxia: Secondary | ICD-10-CM | POA: Diagnosis present

## 2019-07-03 DIAGNOSIS — Z85110 Personal history of malignant carcinoid tumor of bronchus and lung: Secondary | ICD-10-CM

## 2019-07-03 DIAGNOSIS — R339 Retention of urine, unspecified: Secondary | ICD-10-CM | POA: Diagnosis present

## 2019-07-03 DIAGNOSIS — Z833 Family history of diabetes mellitus: Secondary | ICD-10-CM

## 2019-07-03 DIAGNOSIS — E119 Type 2 diabetes mellitus without complications: Secondary | ICD-10-CM | POA: Diagnosis present

## 2019-07-03 DIAGNOSIS — Z79899 Other long term (current) drug therapy: Secondary | ICD-10-CM

## 2019-07-03 DIAGNOSIS — B964 Proteus (mirabilis) (morganii) as the cause of diseases classified elsewhere: Secondary | ICD-10-CM | POA: Diagnosis present

## 2019-07-03 DIAGNOSIS — Z902 Acquired absence of lung [part of]: Secondary | ICD-10-CM

## 2019-07-03 DIAGNOSIS — Z8616 Personal history of COVID-19: Secondary | ICD-10-CM

## 2019-07-03 DIAGNOSIS — R3911 Hesitancy of micturition: Secondary | ICD-10-CM | POA: Diagnosis not present

## 2019-07-03 LAB — CBG MONITORING, ED
Glucose-Capillary: 150 mg/dL — ABNORMAL HIGH (ref 70–99)
Glucose-Capillary: 125 mg/dL — ABNORMAL HIGH (ref 70–99)

## 2019-07-03 LAB — LACTATE DEHYDROGENASE: LDH: 193 U/L — ABNORMAL HIGH (ref 98–192)

## 2019-07-03 LAB — CBC WITH DIFFERENTIAL/PLATELET
Abs Immature Granulocytes: 0.35 10*3/uL — ABNORMAL HIGH (ref 0.00–0.07)
Basophils Absolute: 0 10*3/uL (ref 0.0–0.1)
Basophils Relative: 0 %
Eosinophils Absolute: 0 10*3/uL (ref 0.0–0.5)
Eosinophils Relative: 0 %
HCT: 38.5 % — ABNORMAL LOW (ref 39.0–52.0)
Hemoglobin: 13 g/dL (ref 13.0–17.0)
Immature Granulocytes: 2 %
Lymphocytes Relative: 2 %
Lymphs Abs: 0.4 10*3/uL — ABNORMAL LOW (ref 0.7–4.0)
MCH: 30.7 pg (ref 26.0–34.0)
MCHC: 33.8 g/dL (ref 30.0–36.0)
MCV: 90.8 fL (ref 80.0–100.0)
Monocytes Absolute: 2.4 10*3/uL — ABNORMAL HIGH (ref 0.1–1.0)
Monocytes Relative: 10 %
Neutro Abs: 20 10*3/uL — ABNORMAL HIGH (ref 1.7–7.7)
Neutrophils Relative %: 86 %
Platelets: 141 10*3/uL — ABNORMAL LOW (ref 150–400)
RBC: 4.24 MIL/uL (ref 4.22–5.81)
RDW: 14.3 % (ref 11.5–15.5)
WBC: 23.2 10*3/uL — ABNORMAL HIGH (ref 4.0–10.5)
nRBC: 0 % (ref 0.0–0.2)

## 2019-07-03 LAB — URINALYSIS, ROUTINE W REFLEX MICROSCOPIC
Bacteria, UA: NONE SEEN
Bilirubin Urine: NEGATIVE
Bilirubin Urine: NEGATIVE
Glucose, UA: NEGATIVE mg/dL
Glucose, UA: NEGATIVE mg/dL
Hgb urine dipstick: NEGATIVE
Ketones, ur: 20 mg/dL — AB
Ketones, ur: NEGATIVE mg/dL
Leukocytes,Ua: NEGATIVE
Leukocytes,Ua: NEGATIVE
Nitrite: NEGATIVE
Nitrite: NEGATIVE
Protein, ur: NEGATIVE mg/dL
Protein, ur: NEGATIVE mg/dL
Specific Gravity, Urine: 1.018 (ref 1.005–1.030)
Specific Gravity, Urine: 1.008 (ref 1.005–1.030)
pH: 6 (ref 5.0–8.0)
pH: 6 (ref 5.0–8.0)

## 2019-07-03 LAB — PROTIME-INR
INR: 1.1 (ref 0.8–1.2)
Prothrombin Time: 13.9 seconds (ref 11.4–15.2)

## 2019-07-03 LAB — FIBRINOGEN: Fibrinogen: 746 mg/dL — ABNORMAL HIGH (ref 210–475)

## 2019-07-03 LAB — COMPREHENSIVE METABOLIC PANEL
ALT: 34 U/L (ref 0–44)
AST: 32 U/L (ref 15–41)
Albumin: 2.2 g/dL — ABNORMAL LOW (ref 3.5–5.0)
Alkaline Phosphatase: 133 U/L — ABNORMAL HIGH (ref 38–126)
Anion gap: 9 (ref 5–15)
BUN: 11 mg/dL (ref 8–23)
CO2: 20 mmol/L — ABNORMAL LOW (ref 22–32)
Calcium: 8 mg/dL — ABNORMAL LOW (ref 8.9–10.3)
Chloride: 95 mmol/L — ABNORMAL LOW (ref 98–111)
Creatinine, Ser: 0.78 mg/dL (ref 0.61–1.24)
GFR calc Af Amer: >60 mL/min (ref >60)
GFR calc non Af Amer: >60 mL/min (ref >60)
Glucose, Bld: 204 mg/dL — ABNORMAL HIGH (ref 70–99)
Potassium: 3.5 mmol/L (ref 3.5–5.1)
Sodium: 124 mmol/L — ABNORMAL LOW (ref 135–145)
Total Bilirubin: 0.9 mg/dL (ref 0.3–1.2)
Total Protein: 5.8 g/dL — ABNORMAL LOW (ref 6.5–8.1)

## 2019-07-03 LAB — CREATININE, URINE, RANDOM: Creatinine, Urine: 22.13 mg/dL

## 2019-07-03 LAB — SODIUM, URINE, RANDOM: Sodium, Ur: 17 mmol/L

## 2019-07-03 LAB — GLUCOSE, CAPILLARY
Glucose-Capillary: 164 mg/dL — ABNORMAL HIGH (ref 70–99)
Glucose-Capillary: 203 mg/dL — ABNORMAL HIGH (ref 70–99)

## 2019-07-03 LAB — D-DIMER, QUANTITATIVE: D-Dimer, Quant: 2.68 ug/mL-FEU — ABNORMAL HIGH (ref 0.00–0.50)

## 2019-07-03 LAB — PROCALCITONIN: Procalcitonin: 0.11 ng/mL

## 2019-07-03 LAB — OSMOLALITY: Osmolality: 274 mOsm/kg — ABNORMAL LOW (ref 275–295)

## 2019-07-03 LAB — URIC ACID: Uric Acid, Serum: 3.5 mg/dL — ABNORMAL LOW (ref 3.7–8.6)

## 2019-07-03 LAB — FERRITIN: Ferritin: 453 ng/mL — ABNORMAL HIGH (ref 24–336)

## 2019-07-03 LAB — LACTIC ACID, PLASMA: Lactic Acid, Venous: 1.6 mmol/L (ref 0.5–1.9)

## 2019-07-03 LAB — APTT: aPTT: 30 seconds (ref 24–36)

## 2019-07-03 LAB — OSMOLALITY, URINE: Osmolality, Ur: 153 mOsm/kg — ABNORMAL LOW (ref 300–900)

## 2019-07-03 MED ORDER — SODIUM CHLORIDE 0.9 % IV SOLN: INTRAVENOUS | Status: AC

## 2019-07-03 MED ORDER — AMLODIPINE BESYLATE 5 MG PO TABS
2.5000 mg | ORAL_TABLET | Freq: Every day | ORAL | Status: DC
  Administered 2019-07-03 – 2019-07-06 (×4): 2.5 mg via ORAL
  Filled 2019-07-03 (×4): qty 1

## 2019-07-03 MED ORDER — INSULIN ASPART 100 UNIT/ML ~~LOC~~ SOLN
0.0000 [IU] | Freq: Every day | SUBCUTANEOUS | Status: DC
  Administered 2019-07-03 – 2019-07-05 (×2): 2 [IU] via SUBCUTANEOUS

## 2019-07-03 MED ORDER — VANCOMYCIN HCL 1250 MG/250ML IV SOLN
1250.0000 mg | Freq: Once | INTRAVENOUS | Status: AC
  Administered 2019-07-03: 1250 mg via INTRAVENOUS
  Filled 2019-07-03: qty 250

## 2019-07-03 MED ORDER — ACETAMINOPHEN 325 MG PO TABS: 650.0000 mg | ORAL_TABLET | Freq: Four times a day (QID) | ORAL | Status: DC | PRN

## 2019-07-03 MED ORDER — FINASTERIDE 5 MG PO TABS
5.0000 mg | ORAL_TABLET | Freq: Every day | ORAL | Status: DC
  Administered 2019-07-03 – 2019-07-06 (×4): 5 mg via ORAL
  Filled 2019-07-03 (×5): qty 1

## 2019-07-03 MED ORDER — IOHEXOL 300 MG/ML  SOLN
100.0000 mL | Freq: Once | INTRAMUSCULAR | Status: AC | PRN
  Administered 2019-07-03: 03:00:00 100 mL via INTRAVENOUS

## 2019-07-03 MED ORDER — DEXAMETHASONE SODIUM PHOSPHATE 10 MG/ML IJ SOLN: 6.0000 mg | INTRAMUSCULAR | Status: DC

## 2019-07-03 MED ORDER — DEXAMETHASONE SODIUM PHOSPHATE 10 MG/ML IJ SOLN
6.0000 mg | Freq: Once | INTRAMUSCULAR | Status: AC
  Administered 2019-07-03: 08:00:00 6 mg via INTRAVENOUS
  Filled 2019-07-03: qty 1

## 2019-07-03 MED ORDER — HYDROCOD POLST-CPM POLST ER 10-8 MG/5ML PO SUER: 5.0000 mL | Freq: Two times a day (BID) | ORAL | Status: DC | PRN

## 2019-07-03 MED ORDER — INSULIN ASPART 100 UNIT/ML ~~LOC~~ SOLN
0.0000 [IU] | Freq: Three times a day (TID) | SUBCUTANEOUS | Status: DC
  Administered 2019-07-03 (×2): 1 [IU] via SUBCUTANEOUS
  Administered 2019-07-03: 2 [IU] via SUBCUTANEOUS
  Administered 2019-07-04: 3 [IU] via SUBCUTANEOUS
  Administered 2019-07-04: 1 [IU] via SUBCUTANEOUS
  Administered 2019-07-05: 3 [IU] via SUBCUTANEOUS
  Administered 2019-07-05 – 2019-07-06 (×3): 2 [IU] via SUBCUTANEOUS

## 2019-07-03 MED ORDER — VANCOMYCIN HCL IN DEXTROSE 1-5 GM/200ML-% IV SOLN: 1000.0000 mg | Freq: Once | INTRAVENOUS | Status: DC

## 2019-07-03 MED ORDER — ALBUTEROL SULFATE HFA 108 (90 BASE) MCG/ACT IN AERS: 2.0000 | INHALATION_SPRAY | Freq: Four times a day (QID) | RESPIRATORY_TRACT | Status: DC | PRN

## 2019-07-03 MED ORDER — ENOXAPARIN SODIUM 40 MG/0.4ML ~~LOC~~ SOLN
40.0000 mg | SUBCUTANEOUS | Status: DC
  Administered 2019-07-03 – 2019-07-06 (×4): 40 mg via SUBCUTANEOUS
  Filled 2019-07-03 (×4): qty 0.4

## 2019-07-03 MED ORDER — ZINC SULFATE 220 (50 ZN) MG PO CAPS
220.0000 mg | ORAL_CAPSULE | Freq: Every day | ORAL | Status: DC
  Administered 2019-07-03 – 2019-07-06 (×4): 220 mg via ORAL
  Filled 2019-07-03 (×4): qty 1

## 2019-07-03 MED ORDER — METRONIDAZOLE IN NACL 5-0.79 MG/ML-% IV SOLN
500.0000 mg | Freq: Once | INTRAVENOUS | Status: AC
  Administered 2019-07-03: 03:00:00 500 mg via INTRAVENOUS
  Filled 2019-07-03: qty 100

## 2019-07-03 MED ORDER — LOSARTAN POTASSIUM 50 MG PO TABS: 100.0000 mg | ORAL_TABLET | Freq: Every day | ORAL | Status: DC

## 2019-07-03 MED ORDER — VANCOMYCIN HCL 1500 MG/300ML IV SOLN: 1500.0000 mg | INTRAVENOUS | Status: DC

## 2019-07-03 MED ORDER — PHENAZOPYRIDINE HCL 100 MG PO TABS
100.0000 mg | ORAL_TABLET | Freq: Two times a day (BID) | ORAL | Status: DC | PRN
  Filled 2019-07-03: qty 1

## 2019-07-03 MED ORDER — CHLORHEXIDINE GLUCONATE CLOTH 2 % EX PADS
6.0000 | MEDICATED_PAD | Freq: Every day | CUTANEOUS | Status: DC
  Administered 2019-07-03 – 2019-07-06 (×3): 6 via TOPICAL

## 2019-07-03 MED ORDER — SODIUM CHLORIDE 0.9 % IV SOLN
2.0000 g | Freq: Once | INTRAVENOUS | Status: AC
  Administered 2019-07-03: 02:00:00 2 g via INTRAVENOUS
  Filled 2019-07-03: qty 2

## 2019-07-03 MED ORDER — TAMSULOSIN HCL 0.4 MG PO CAPS
0.4000 mg | ORAL_CAPSULE | Freq: Every day | ORAL | Status: DC
  Administered 2019-07-03: 0.4 mg via ORAL
  Filled 2019-07-03: qty 1

## 2019-07-03 MED ORDER — SODIUM CHLORIDE 0.9 % IV SOLN: INTRAVENOUS | Status: DC

## 2019-07-03 MED ORDER — ACETAMINOPHEN 325 MG PO TABS
650.0000 mg | ORAL_TABLET | Freq: Once | ORAL | Status: AC
  Administered 2019-07-03: 650 mg via ORAL
  Filled 2019-07-03: qty 2

## 2019-07-03 MED ORDER — GUAIFENESIN-DM 100-10 MG/5ML PO SYRP
10.0000 mL | ORAL_SOLUTION | ORAL | Status: DC | PRN
  Administered 2019-07-04 – 2019-07-05 (×3): 10 mL via ORAL
  Filled 2019-07-03 (×3): qty 10

## 2019-07-03 MED ORDER — ATORVASTATIN CALCIUM 40 MG PO TABS
40.0000 mg | ORAL_TABLET | Freq: Every day | ORAL | Status: DC
  Administered 2019-07-03 – 2019-07-06 (×4): 40 mg via ORAL
  Filled 2019-07-03 (×4): qty 1

## 2019-07-03 MED ORDER — ASCORBIC ACID 500 MG PO TABS
500.0000 mg | ORAL_TABLET | Freq: Every day | ORAL | Status: DC
  Administered 2019-07-03 – 2019-07-06 (×4): 500 mg via ORAL
  Filled 2019-07-03 (×4): qty 1

## 2019-07-03 MED ORDER — CALCIUM POLYCARBOPHIL 625 MG PO TABS
625.0000 mg | ORAL_TABLET | Freq: Two times a day (BID) | ORAL | Status: DC
  Administered 2019-07-03 – 2019-07-06 (×6): 625 mg via ORAL
  Filled 2019-07-03 (×8): qty 1

## 2019-07-03 MED ORDER — SODIUM CHLORIDE 0.9 % IV SOLN
2.0000 g | Freq: Three times a day (TID) | INTRAVENOUS | Status: DC
  Administered 2019-07-03 – 2019-07-04 (×4): 2 g via INTRAVENOUS
  Filled 2019-07-03 (×6): qty 2

## 2019-07-03 NOTE — ED Notes (Signed)
Vital signs stable.Patient denies pain and is resting comfortably. Patient is resting comfortably.

## 2019-07-03 NOTE — ED Triage Notes (Signed)
Pt c/o urinary frequency, states he is having to go every 15-20 minutes, but only going small amounts. C/o pain with urination. Pt also reports that he was diagnosed with COVID, and sent home with oxygen, pt reports he is to wear 4L [place] always. SpO2 initially 84%, pt appears short of breath, stopping between phrases for a breath. O2 improved to 90.

## 2019-07-03 NOTE — ED Provider Notes (Signed)
[facility] EMERGENCY DEPARTMENT Provider Note   CSN: [id number] Arrival date & time: 07/03/19  0044     History Chief Complaint  Patient presents with  . Urinary Frequency  . Shortness of Breath    Jason Malone is a 79 y.o. male.  Patient presents to the emergency department with a chief complaint of urinary frequency and urgency.  He reports having the symptoms for the past 3 days.  States that he is also having lower abdominal pain.  He was diagnosed with Covid-19 at the end of January.  He has been wearing oxygen while recovering from Covid, but states that for the most part he has been off of oxygen recently.  He states that over the past 3 days he has begun wearing oxygen again due to some shortness of breath.  He states that he still has some lingering cough.  He denies any fever at home.  He was seen at [facility] 3 days ago, and was prescribed Pyridium and symptoms were thought to be secondary to BPH.  The history is provided by the patient. No language interpreter was used.       Past Medical History:  Diagnosis Date  . Allergic rhinitis   . Benign prostatic hypertrophy with lower urinary tract symptoms (LUTS)   . Chest pain    a. 05/2013 MV: EF 70%, no ischemia.  . Diverticulitis    DIVERTICULOSIS  . Enlarged prostate   . Essential hypertension    CONTROLLED ON MEDS  . Full dentures    upper and lower  . H/O hypokalemia   . Hx of atrial fibrillation without current medication    CARDIOLOGIST-DR [facility]  . 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 Hyperlipemia   . Hypomagnesemia   . IFG (impaired fasting glucose)   . Liver cancer ([place])   . Liver cancer ([place]) 12/08/2016  . Liver mass, left lobe 08/28/2016   Probably hemangioma; will get MRI of liver, refer to GI  . Lung cancer ([place])    a. carcinoid, left lung, Stage 1b (T2a, N0, cM0);  b. 05/2013 s/p VATS & LULobectomy.  . Monocytosis   . Osteopenia   . Paroxysmal  atrial flutter ([facility])    a. 03/2013->no recurrence;  b. CHA2DS2VASc = 2-->not currently on anticoagulation;  c. 05/2012 Echo: EF 55-60%, normal RV.  [name] Wears glasses     Patient Active Problem List   Diagnosis Date Noted  . Diabetes mellitus type 2, controlled, with complications ([place]) 23/53/6144  . HLD (hyperlipidemia) 06/14/2019  . 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 Acute respiratory failure with hypoxia ([place]) 06/11/2019  . Elevated LFTs 06/11/2019  . Pneumonia due to COVID-19 virus 06/10/2019  . Contracture of palmar fascia 10/11/2018  . Microalbuminuria due to type 2 diabetes mellitus ([place]) 04/12/2018  . Type 2 diabetes mellitus ([place]) 04/10/2018  . Osteopenia determined by x-ray 01/07/2017  . Neuroendocrine carcinoma of lung ([place]) 12/28/2016  . Low back pain 12/17/2016  . Liver mass, left lobe 08/28/2016  . 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 History of lung cancer 06/29/2016  . Aortic atherosclerosis ([place]) 04/06/2016  . Coronary artery disease involving native heart without angina pectoris 04/06/2016  . History of colonic polyps 10/28/2015  . Medication monitoring encounter 08/15/2015  . Paroxysmal atrial flutter ([place])   . Elevated serum alkaline phosphatase level 03/05/2015  . Allergic rhinitis   . BPH without urinary obstruction   .  Hyperlipidemia 02/13/2014  . Sinus bradycardia 07/18/2013  . S/P lobectomy of lung 06/04/2013  . Atrial flutter ([place]) 04/09/2013  . Smoking history 04/09/2013  . Essential hypertension 04/09/2013  . Rectal bleeding 12/15/2012    Past Surgical History:  Procedure Laterality Date  . BRONCHOSCOPY     04/06/2013  . CARDIOVASCULAR STRESS TEST  05/2013   a. No evidence of ischemia or infarct, EF 70%, no WMAs  . CATARACT EXTRACTION W/PHACO Left 07/14/2015   Procedure: CATARACT EXTRACTION PHACO AND INTRAOCULAR LENS PLACEMENT (IOC);  Surgeon: [name], MD;  Location: [place];  Service: Ophthalmology;   Laterality: Left;  . CATARACT EXTRACTION W/PHACO Right 10/01/2015   Procedure: CATARACT EXTRACTION PHACO AND INTRAOCULAR LENS PLACEMENT (IOC);  Surgeon: [name], MD;  Location: [place];  Service: Ophthalmology;  Laterality: Right;  . COLONOSCOPY W/ POLYPECTOMY     bleed after-had to go to surgery to stop bleeding via colonoscopy  . COLONOSCOPY WITH PROPOFOL N/A 11/19/2015   Procedure: COLONOSCOPY WITH PROPOFOL;  Surgeon: Kyoko Elsea Bellow, MD;  Location: [facility] ENDOSCOPY;  Service: Endoscopy;  Laterality: N/A;  . DUPUYTREN CONTRACTURE RELEASE  12/15/2011   Procedure: DUPUYTREN CONTRACTURE RELEASE;  Surgeon: [name], MD;  Location[place];  Service: Orthopedics;  Laterality: Left;  Fasciotomy left ring finger dupuytrens  . FASCIECTOMY Right 11/07/2018   Procedure: SEGMENTAL FASCIECTOMY RIGHT RING FINGER;  Surgeon: Daryll Brod, MD;  Location: [place];  Service: Orthopedics;  Laterality: Right;  AXILLARY BLOCK  . IR ANGIOGRAM SELECTIVE EACH ADDITIONAL VESSEL  07/26/2017  . IR ANGIOGRAM SELECTIVE EACH ADDITIONAL VESSEL  07/26/2017  . IR ANGIOGRAM SELECTIVE EACH ADDITIONAL VESSEL  07/26/2017  . IR ANGIOGRAM SELECTIVE EACH ADDITIONAL VESSEL  07/26/2017  . IR ANGIOGRAM SELECTIVE EACH ADDITIONAL VESSEL  07/26/2017  . IR ANGIOGRAM SELECTIVE EACH ADDITIONAL VESSEL  08/11/2017  . IR ANGIOGRAM SELECTIVE EACH ADDITIONAL VESSEL  04/28/2018  . IR ANGIOGRAM VISCERAL SELECTIVE  07/26/2017  . IR ANGIOGRAM VISCERAL SELECTIVE  08/11/2017  . IR ANGIOGRAM VISCERAL SELECTIVE  04/28/2018  . IR EMBO ARTERIAL NOT HEMORR HEMANG INC GUIDE ROADMAPPING  07/26/2017  . IR EMBO TUMOR ORGAN ISCHEMIA INFARCT INC GUIDE ROADMAPPING  08/11/2017  . IR EMBO TUMOR ORGAN ISCHEMIA INFARCT INC GUIDE ROADMAPPING  04/28/2018  . IR RADIOLOGIST EVAL & MGMT  07/07/2017  . IR RADIOLOGIST EVAL & MGMT  09/07/2017  . IR RADIOLOGIST EVAL & MGMT  12/21/2017  . IR RADIOLOGIST EVAL & MGMT  03/22/2018  .  IR RADIOLOGIST EVAL & MGMT  06/08/2018  . IR RADIOLOGIST EVAL & MGMT  11/01/2018  . IR RADIOLOGIST EVAL & MGMT  03/21/2019  . IR US GUIDE VASC ACCESS RIGHT  07/26/2017  . IR US GUIDE VASC ACCESS RIGHT  08/11/2017  . IR US GUIDE VASC ACCESS RIGHT  04/28/2018  . LUNG LOBECTOMY  06/10/13   upper left lung  . MULTIPLE TOOTH EXTRACTIONS    . REMOVAL RETAINED LENS Right 11/17/2015   Procedure: REMOVAL RETAINED LENS FROAGMENTS RIGHT EYE;  Surgeon: [name], MD;  Location: [place];  Service: Ophthalmology;  Laterality: Right;  . TONSILLECTOMY    . UPPER GASTROINTESTINAL ENDOSCOPY  11-03-15   Dr [name]  . VASECTOMY    . VIDEO ASSISTED THORACOSCOPY (VATS)/WEDGE RESECTION Left 06/04/2013   Procedure: VIDEO ASSISTED THORACOSCOPY (VATS)/WEDGE RESECTION;  Surgeon: [name], MD;  Location: [place];  Service: Thoracic;  Laterality: Left;  [name] VIDEO BRONCHOSCOPY N/A 06/04/2013  Procedure: VIDEO BRONCHOSCOPY;  Surgeon: [name], MD;  Location: [facility] OR;  Service: Thoracic;  Laterality: N/A;       Family History  Problem Relation Age of Onset  . Stroke Mother   . Alzheimer's disease Mother   . Hypertension Sister   . Hyperlipidemia Sister   . Hypertension Brother   . Hyperlipidemia Brother   . Diabetes Brother     Social History   Tobacco Use  . Smoking status: Former Smoker    Packs/day: 1.00    Years: 40.00    Pack years: 40.00    Types: Cigarettes    Quit date: 12/09/1998    Years since quitting: 20.5  . Smokeless tobacco: Never Used  Substance Use Topics  . Alcohol use: No  . Drug use: No    Home Medications Prior to Admission medications   Medication Sig Start Date End Date Taking? Authorizing Provider  amLODipine (NORVASC) 2.5 MG tablet Take 1 tablet by mouth once daily Patient taking differently: Take 2.5 mg by mouth daily.  01/01/19   Minna Merritts, MD  atorvastatin (LIPITOR) 40 MG tablet TAKE 1 TABLET BY MOUTH ONCE DAILY FOR CHOLESTEROL Patient  taking differently: Take 40 mg by mouth daily.  04/11/19   Delsa Grana, PA-C  Calcium Carbonate-Vitamin D3 (CALCIUM 600/VITAMIN D) 600-400 MG-UNIT TABS Take 1 tablet by mouth daily.     [provider]  cholecalciferol (VITAMIN D3) 25 MCG (1000 UNIT) tablet Take 1,000 Units by mouth daily.    [provider]  Cinnamon 500 MG capsule Take 500 mg by mouth at bedtime. BEDTIME    [provider]  finasteride (PROSCAR) 5 MG tablet Take 1 tablet (5 mg total) by mouth daily. 04/11/19   Delsa Grana, PA-C  guaiFENesin-dextromethorphan (ROBITUSSIN DM) 100-10 MG/5ML syrup Take 10 mLs by mouth every 4 (four) hours as needed for cough. 06/17/19   Allie Bossier, MD  Ipratropium-Albuterol (COMBIVENT) 20-100 MCG/ACT AERS respimat Inhale 1 puff into the lungs every 6 (six) hours. 06/17/19   Allie Bossier, MD  LANREOTIDE ACETATE [place] Inject 1 Dose into the skin every 30 (thirty) days.    [provider]  losartan (COZAAR) 100 MG tablet Take 1 tablet (100 mg total) by mouth daily. 04/11/19   Delsa Grana, PA-C  Multiple Vitamins-Minerals (MULTIVITAMIN WITH MINERALS) tablet Take 1 tablet by mouth daily. AM    [provider]  phenazopyridine (PYRIDIUM) 100 MG tablet Take 1 tablet (100 mg total) by mouth 2 (two) times daily as needed for pain. 06/30/19 06/29/20  Triplett, Dessa Phi, FNP  polycarbophil (FIBERCON) 625 MG tablet Take 625 mg by mouth 2 (two) times daily. AM    [provider]  tamsulosin (FLOMAX) 0.4 MG CAPS capsule Take 1 capsule (0.4 mg total) by mouth daily. Patient taking differently: Take 0.4 mg by mouth at bedtime.  04/11/19   Delsa Grana, PA-C  vitamin C (ASCORBIC ACID) 500 MG tablet Take 500 mg by mouth daily. AM    [provider]  zinc sulfate 220 (50 Zn) MG capsule Take 1 capsule (220 mg total) by mouth daily. 06/18/19   Allie Bossier, MD    Allergies    Latex and Tape  Review of Systems   Review of Systems  All other systems reviewed  and are negative.   Physical Exam Updated Vital Signs BP 127/67 (BP Location: Right Arm)   Pulse (!) 122   Temp (!) 97.5 F (36.4 C) (Oral)  Resp (!) 24   Ht 5\' 4"  (1.626 m)   Wt 65.8 kg   SpO2 (!) 86%   BMI 24.89 kg/m   Physical Exam Vitals and nursing note reviewed.  Constitutional:      Appearance: He is well-developed.  HENT:     Head: Normocephalic and atraumatic.  Eyes:     Conjunctiva/sclera: Conjunctivae normal.  Cardiovascular:     Rate and Rhythm: Regular rhythm. Tachycardia present.     Heart sounds: No murmur.  Pulmonary:     Effort: No respiratory distress.     Breath sounds: Wheezing present.     Comments: Increased work of breathing, mild tachypnea, wheezing Abdominal:     Palpations: Abdomen is soft.     Tenderness: There is no abdominal tenderness.  Musculoskeletal:        General: Normal range of motion.     Cervical back: Neck supple.  Skin:    General: Skin is warm and dry.  Neurological:     Mental Status: He is alert and oriented to person, place, and time.  Psychiatric:        Mood and Affect: Mood normal.        Behavior: Behavior normal.     ED Results / Procedures / Treatments   Labs (all labs ordered are listed, but only abnormal results are displayed) Labs Reviewed  CULTURE, BLOOD (ROUTINE X 2)  CULTURE, BLOOD (ROUTINE X 2)  URINE CULTURE  LACTIC ACID, PLASMA  LACTIC ACID, PLASMA  COMPREHENSIVE METABOLIC PANEL  CBC WITH DIFFERENTIAL/PLATELET  APTT  PROTIME-INR  URINALYSIS, ROUTINE W REFLEX MICROSCOPIC    EKG EKG Interpretation  Date/Time:  Tuesday July 03 2019 00:52:15 EST Ventricular Rate:  112 PR Interval:  146 QRS Duration: 72 QT Interval:  332 QTC Calculation: 453 R Axis:   27 Text Interpretation: Sinus tachycardia Minimal voltage criteria for LVH, may be normal variant ( R in aVL ) Nonspecific ST and T wave abnormality Abnormal ECG Confirmed by Thayer Jew [phone]) on 07/03/2019 1:27:15  AM   Radiology No results found.  Procedures .Critical Care Performed by: Montine Circle, PA-C Authorized by: Montine Circle, PA-C   Critical care provider statement:    Critical care time (minutes):  40   Critical care was necessary to treat or prevent imminent or life-threatening deterioration of the following conditions:  Sepsis   Critical care was time spent personally by me on the following activities:  Discussions with consultants, evaluation of patient's response to treatment, examination of patient, ordering and performing treatments and interventions, ordering and review of laboratory studies, ordering and review of radiographic studies, pulse oximetry, re-evaluation of patient's condition, obtaining history from patient or surrogate and review of old charts   (including critical care time)  Medications Ordered in ED Medications  ceFEPIme (MAXIPIME) 2 g in sodium chloride 0.9 % 100 mL IVPB (has no administration in time range)  metroNIDAZOLE (FLAGYL) IVPB 500 mg (has no administration in time range)  vancomycin (VANCOREADY) IVPB 1250 mg/250 mL (has no administration in time range)    ED Course  I have reviewed the triage vital signs and the nursing notes.  Pertinent labs & imaging results that were available during my care of the patient were reviewed by me and considered in my medical decision making (see chart for details).    MDM Rules/Calculators/A&P                      Patient here with urinary  frequency, urgency, and suprapubic pain.  He is noted to be tachycardic to the 120s, tachypneic to the mid 20s, and hypoxic to 86% on 4 L nasal cannula.  He was diagnosed with COVID-19 at the end of January, and did require oxygen while recovering.  He states that more recently he has been off of oxygen for the most part.  He has begun requiring oxygen again since having the urinary symptoms and abdominal pain.  He is now requiring 6 L nasal cannula to maintain O2 sats  greater than 90% in the emergency department.  He is afebrile, but does appear uncomfortable.  Bladder scan shows 164 mL and greater than 200 mL on 2 separate readings.  Will recheck after he voids.  Patient seen by discussed with Dr. Dina Rich, who agrees with plan for sepsis work-up and for CT abdomen/pelvis given focal abdominal tenderness.  CT of the abdomen reveals no acute intra-abdominal process, but does show worsening airspace disease.  Given worsening symptoms and increased oxygen requirement, felt the patient should be admitted to the hospital.  Case discussed with Dr. Marlowe Sax, who will admit.   Final Clinical Impression(s) / ED Diagnoses Final diagnoses:  Acute respiratory failure with hypoxia [facility])    Rx / DC Orders ED Discharge Orders    None       Montine Circle, PA-C 07/03/19 [id number]    Merryl Hacker, MD 07/03/19 0530

## 2019-07-03 NOTE — Progress Notes (Signed)
PROGRESS NOTE                                                                                                                                                                                                             Patient Demographics:    Jason Malone, is a 79 y.o. male, DOB - 1940-11-06, NGX:[id number]  Outpatient Primary MD for the patient is Delsa Grana, PA-C    LOS - 0  Admit date - 07/03/2019    Chief Complaint  Patient presents with  . Urinary Frequency  . Shortness of Breath       Brief Narrative  Jason Malone is a 79 y.o. male with medical history significant of BPH with LUTS, hypertension, hyperlipidemia, metastatic lung cancer, paroxysmal A. fib currently not on anticoagulation, diet-controlled type 2 diabetes, recent hospital admission in January 2021 for COVID-19 viral pneumonia presenting with complaints of lower urinary tract symptoms.  His main complaint was inability to pass urine and making less urine, his work-up suggested some pneumonia and urinary retention, he was admitted to the hospital for further care.   Subjective:    Jason Malone today has, No headache, No chest pain, No abdominal pain - No Nausea, No new weakness tingling or numbness, cough or shortness of breath but has problems passing urine.   Assessment  & Plan :     1.  History of BPH with difficulty in passing urine. Had suprapubic fullness on exam, patient states he is having difficulty in passing urine for the last several days, bladder scan bedside shows over a liter of urine, Foley placed, continue home medications for BPH with outpatient urology follow-up.  We will also send UA for culture and sensitivity.  For now antibiotics for pneumonia will be continued.   2. Pneumonia with history of recent fully treated COVID-19 pneumonia - do not think he has any signs of active COVID-19 infection, he is beyond 21 days of quarantine, will  titrate down antibiotics, follow culture sensitivity and treat him for bacterial pneumonia.  Encouraged the patient to sit up in chair in the daytime use I-S and flutter valve for pulmonary toiletry and then prone in bed when at night.    SpO2: 99 % O2 Flow Rate (L/min): 6 L/min  Recent Labs  Lab 07/03/19 0705  DDIMER 2.68*  FERRITIN 453*  PROCALCITON 0.11    Hepatic Function Latest Ref Rng & Units 07/03/2019 06/30/2019 06/17/2019  Total Protein 6.5 - 8.1 g/dL 5.8(L) 5.9(L) 5.7(L)  Albumin 3.5 - 5.0 g/dL 2.2(L) 2.4(L) 2.8(L)  AST 15 - 41 U/L 32 26 25  ALT 0 - 44 U/L 34 35 41  Alk Phosphatase 38 - 126 U/L 133(H) 111 136(H)  Total Bilirubin 0.3 - 1.2 mg/dL 0.9 0.9 0.9  Bilirubin, Direct 0.0 - 0.2 mg/dL - - -      3.  Hyponatremia.  Appears dehydrated, will provide IV fluids and check urine and serum electrolytes and osmolality, will also check serum uric acid.  4.  Hypertension.  Continue Norvasc.  5.  Recent finding of endobronchial mass found last admission.  Outpatient follow-up with PCP for age-appropriate follow-up and work-up.  PCP Dr. Grayland Ormond was informed last admission by Dr. Dia Crawford.  6.  Dyslipidemia.  On statin.  7.  Diet-controlled DM 2 .  Recent A1c 6.8 on 06/14/2019.  ISS.  CBG (last 3)  Recent Labs    07/03/19 0757  GLUCAP 150*     Condition - Fair  Family Communication  : wife - 07/03/19  Code Status :  Full  Diet :   Diet Order            Diet Carb Modified Fluid consistency: Thin; Room service appropriate? Yes  Diet effective now               Disposition Plan  : In the hospital getting treatment for pneumonia with IV antibiotics  Consults  :  None  Procedures  :    CT Abdomen and pelvis -  basilar infiltrate, enlarged prostate.  PUD Prophylaxis :  None  DVT Prophylaxis  :  Lovenox   Lab Results  Component Value Date   PLT 141 (L) 07/03/2019    Inpatient Medications  Scheduled Meds: . amLODipine  2.5 mg Oral Daily  .  vitamin C  500 mg Oral Daily  . atorvastatin  40 mg Oral Daily  . [START ON 07/04/2019] dexamethasone (DECADRON) injection  6 mg Intravenous Q24H  . enoxaparin (LOVENOX) injection  40 mg Subcutaneous Q24H  . finasteride  5 mg Oral Daily  . insulin aspart  0-5 Units Subcutaneous QHS  . insulin aspart  0-9 Units Subcutaneous TID WC  . polycarbophil  625 mg Oral BID  . tamsulosin  0.4 mg Oral QHS  . zinc sulfate  220 mg Oral Daily   Continuous Infusions: . sodium chloride 100 mL/hr at 07/03/19 0821  . ceFEPime (MAXIPIME) IV    . [START ON 07/04/2019] vancomycin     PRN Meds:.acetaminophen, albuterol, chlorpheniramine-HYDROcodone, guaiFENesin-dextromethorphan, phenazopyridine  Antibiotics  :    Anti-infectives (From admission, onward)   Start     Dose/Rate Route Frequency Ordered Stop   07/04/19 0500  vancomycin (VANCOREADY) IVPB 1500 mg/300 mL     1,500 mg 150 mL/hr over 120 Minutes Intravenous Every 24 hours 07/03/19 0654     07/03/19 1100  ceFEPIme (MAXIPIME) 2 g in sodium chloride 0.9 % 100 mL IVPB     2 g 200 mL/hr over 30 Minutes Intravenous Every 8 hours 07/03/19 0654     07/03/19 0145  vancomycin (VANCOREADY) IVPB 1250 mg/250 mL     1,250 mg 166.7 mL/hr over 90 Minutes Intravenous  Once 07/03/19 0128 07/03/19 0419   07/03/19 0130  ceFEPIme (MAXIPIME) 2 g in sodium chloride 0.9 % 100 mL IVPB  2 g 200 mL/hr over 30 Minutes Intravenous  Once 07/03/19 0124 07/03/19 0229   07/03/19 0130  metroNIDAZOLE (FLAGYL) IVPB 500 mg     500 mg 100 mL/hr over 60 Minutes Intravenous  Once 07/03/19 0124 07/03/19 0337   07/03/19 0130  vancomycin (VANCOCIN) IVPB 1000 mg/200 mL premix  Status:  Discontinued     1,000 mg 200 mL/hr over 60 Minutes Intravenous  Once 07/03/19 0124 07/03/19 0128       Time Spent in minutes  30   Lala Lund M.D on 07/03/2019 at 10:22 AM  To page go to [email or web address] - password [email or web address]  [facility] -  Office  [phone]    See all Orders from  today for further details    Objective:   Vitals:   07/03/19 0900 07/03/19 0915 07/03/19 0930 07/03/19 0945  BP: 125/67 108/61 (!) 117/58 (!) 114/59  Pulse: 67 69 69 68  Resp: '20 20 20 19  '$ Temp:      TempSrc:      SpO2: 98% 98% 98% 99%  Weight:      Height:        Wt Readings from Last 3 Encounters:  07/03/19 65.8 kg  06/30/19 65.8 kg  06/25/19 65.8 kg    No intake or output data in the 24 hours ending 07/03/19 1022   Physical Exam  Awake Alert, No new F.N deficits, Normal affect [place].AT,PERRAL Supple Neck,No JVD, No cervical lymphadenopathy appriciated.  Symmetrical Chest wall movement, Good air movement bilaterally, CTAB RRR,No Gallops,Rubs or new Murmurs, No Parasternal Heave +ve B.Sounds, Abd Soft,++ suprapubic fullness, nontender. No Cyanosis,     Data Review:    CBC Recent Labs  Lab 06/30/19 1530 07/03/19 0139  WBC 23.2* 23.2*  HGB 13.3 13.0  HCT 40.0 38.5*  PLT 181 141*  MCV 91.5 90.8  MCH 30.4 30.7  MCHC 33.3 33.8  RDW 14.8 14.3  LYMPHSABS 0.5* 0.4*  MONOABS 2.1* 2.4*  EOSABS 0.0 0.0  BASOSABS 0.1 0.0    Chemistries  Recent Labs  Lab 06/30/19 1530 07/03/19 0139  NA 133* 124*  K 4.3 3.5  CL 98 95*  CO2 25 20*  GLUCOSE 231* 204*  BUN 13 11  CREATININE 0.90 0.78  CALCIUM 8.5* 8.0*  AST 26 32  ALT 35 34  ALKPHOS 111 133*  BILITOT 0.9 0.9   ------------------------------------------------------------------------------------------------------------------ No results for input(s): CHOL, HDL, LDLCALC, TRIG, CHOLHDL, LDLDIRECT in the last 72 hours.  Lab Results  Component Value Date   HGBA1C 6.8 (H) 06/14/2019   ------------------------------------------------------------------------------------------------------------------ No results for input(s): TSH, T4TOTAL, T3FREE, THYROIDAB in the last 72 hours.  Invalid input(s): FREET3  Cardiac Enzymes No results for input(s): CKMB, TROPONINI, MYOGLOBIN in the last 168 hours.  Invalid  input(s): CK ------------------------------------------------------------------------------------------------------------------ No results found for: BNP  Micro Results No results found for this or any previous visit (from the past 240 hour(s)).  Radiology Reports DG Chest 2 View  Result Date: 06/07/2019 CLINICAL DATA:  Cough EXAM: CHEST - 2 VIEW COMPARISON:  2019 FINDINGS: The heart size and mediastinal contours are within normal limits. Postoperative changes at the left hilum. No new consolidation or edema. Right mid lung calcified granuloma. Degenerative changes of the spine. IMPRESSION: No acute process in the chest Electronically Signed   By: Macy Mis M.D.   On: 06/07/2019 11:31   CT ANGIO CHEST PE W OR WO CONTRAST  Result Date: 06/11/2019 CLINICAL DATA:  Shortness of breath.  COVID positive. EXAM: CT  ANGIOGRAPHY CHEST WITH CONTRAST TECHNIQUE: Multidetector CT imaging of the chest was performed using the standard protocol during bolus administration of intravenous contrast. Multiplanar CT image reconstructions and MIPs were obtained to evaluate the vascular anatomy. CONTRAST:  36m OMNIPAQUE IOHEXOL 350 MG/ML SOLN COMPARISON:  06/27/2016 FINDINGS: Cardiovascular: Normal heart size. No pericardial effusion. Atherosclerotic calcification of the aorta and coronaries. No pulmonary artery filling defect Mediastinum/Nodes: Negative for adenopathy or mass. Lungs/Pleura: Left upper lobectomy for bronchial carcinoid per chart. There is ground-glass opacity with a subpleural predilection in the right more than left lung consistent with history of COVID 19 positivity. Also, there is a irregular lobulated endobronchial mass at the level of the distal left mainstem bronchus, 2 cm in length. Bronchus appears somewhat thin along the esophageal margin but no clear esophageal fistulization. Upper Abdomen: Cholelithiasis. History of treated liver metastases, reference MRI 03/15/2019. Musculoskeletal:  Spondylosis. Rounded sclerotic focus in the T11 vertebral body is stable from 2019. Review of the MIP images confirms the above findings. IMPRESSION: 1. Bilateral ground-glass pneumonia consistent with COVID-19 positivity. 2. 2 cm left main endobronchial mass close to a left upper lobectomy margin, presumably local recurrence of bronchial carcinoid. Electronically Signed   By: JMonte FantasiaM.D.   On: 06/11/2019 04:28   CT ABDOMEN PELVIS W CONTRAST  Result Date: 07/03/2019 CLINICAL DATA:  Dysuria, urinary frequency, history of lung and liver cancer EXAM: CT ABDOMEN AND PELVIS WITH CONTRAST TECHNIQUE: Multidetector CT imaging of the abdomen and pelvis was performed using the standard protocol following bolus administration of intravenous contrast. CONTRAST:  1044mOMNIPAQUE IOHEXOL 300 MG/ML  SOLN COMPARISON:  06/11/2019, 03/15/2019 FINDINGS: Lower chest: There is progressive consolidation at the right lung base consistent with persistent pneumonia. Trace bilateral pleural effusions have developed in the interim. Hepatobiliary: 2.1 cm mass within the lateral segment left lobe liver, reference image 20, consistent with known metastatic disease. No other discrete liver abnormalities are identified. Calcified gallstones are seen without cholecystitis. Pancreas: Unremarkable. No pancreatic ductal dilatation or surrounding inflammatory changes. Spleen: Normal in size without focal abnormality. Adrenals/Urinary Tract: There is mild diffuse left renal atrophy, stable. Chronic scarring lateral aspect right kidney. Bladder is moderately distended without focal abnormality. The adrenals are normal. Stomach/Bowel: Normal appendix right lower quadrant. No bowel obstruction or ileus. Scattered diverticulosis of the distal colon without diverticulitis. Vascular/Lymphatic: Aortic atherosclerosis. No enlarged abdominal or pelvic lymph nodes. Reproductive: Prostate is enlarged measuring 5.6 x 5.6 cm in transverse dimension.  Other: No abdominal wall hernia or abnormality. No abdominopelvic ascites. Musculoskeletal: There are no acute or destructive bony lesions. Punctate sclerotic foci within the lumbar spine and bony pelvis are stable likely reflecting bone islands. Reconstructed images demonstrate no additional findings. IMPRESSION: 1. Progressive right basilar airspace disease consistent with pneumonia. 2. Trace bilateral pleural effusions. 3. 2.1 cm left lobe liver lesion consistent with known metastatic disease. 4. Enlarged prostate. 5. Diverticulosis without diverticulitis. Electronically Signed   By: MiRanda Ngo.D.   On: 07/03/2019 03:46   DG Chest Port 1 View  Result Date: 07/03/2019 CLINICAL DATA:  Urinary frequency EXAM: PORTABLE CHEST 1 VIEW COMPARISON:  CT 06/11/2019, radiograph 06/10/2019 FINDINGS: Worsening consolidative and interstitial opacity throughout the right hemithorax. Additional reticular changes in the left lung base likely reflecting scarring with associated tenting of the left hemidiaphragm. Suspect at least small right effusion and likely left effusion as well. No pneumothorax. Prominence of the cardiac silhouette may be related to portable technique. Calcified aorta is noted. Coronary calcification versus stent  overlying the left heart border. No acute osseous or soft tissue abnormality. Degenerative changes are present in the imaged spine and shoulders. IMPRESSION: Worsening interstitial and airspace disease in the right hemithorax with associated volume loss and likely right pleural effusion. Additional scarring and opacity in the left lung base is present as well likely with trace left effusion. Features could reflect persistent infection versus recrudescence or superinfection. Aortic Atherosclerosis (ICD10-I70.0). Mild prominence of the cardiac silhouette, possibly related to portable technique. Electronically Signed   By: Lovena Le M.D.   On: 07/03/2019 01:51   DG Chest Port 1  View  Result Date: 06/10/2019 CLINICAL DATA:  Fever.  COVID-19 pneumonia. EXAM: PORTABLE CHEST 1 VIEW COMPARISON:  June 07, 2019 FINDINGS: Been significant interval development diffuse airspace opacities throughout the right lung field with focal areas of consolidation involving the right lower lobe. There is an airspace opacity at the left lung base. There is no pneumothorax. No large pleural effusion. Aortic calcifications are noted. The heart size is normal. IMPRESSION: Significant interval development of bilateral airspace opacities, greatest on the right, concerning for multifocal pneumonia (viral or bacterial). Electronically Signed   By: Constance Holster M.D.   On: 06/10/2019 21:19

## 2019-07-03 NOTE — ED Notes (Signed)
PT's family (son, and wife) in lobby requesting update on PT, [phone] (son's phone number). Family brought cellphone in for PT, and this tech took cellphone to patient in his RM. Wife provided home phone number in case he is admitted since they share the cellphone provided to PT. [phone]) (home phone number). However, family will be here through duration of pt's ED stay waiting in the car so best contact would be son's cell number provided above^^

## 2019-07-03 NOTE — Progress Notes (Signed)
Pharmacy Antibiotic Note  Jason Malone is a 79 y.o. male admitted on 07/03/2019 with pneumonia.  Pharmacy has been consulted for Vancomycin and Cefepime dosing. Noted recent admission to [facility] with COVID (d/c 06/17/19)  Pt received Cefepime 2gm and Vanc 1250mg  in ED.   Plan: Cefepime 2gm IV q8h Vancomycin 1500 mg IV Q 24 hrs. Goal AUC 400-550. Expected AUC: 536 SCr used: 0.8 Will f/u renal function, micro data, and pt's clinical condition Vanc levels prn   Height: 5\' 4"  (162.6 cm) Weight: 145 lb (65.8 kg) IBW/kg (Calculated) : 59.2  Temp (24hrs), Avg:97.5 F (36.4 C), Min:97.5 F (36.4 C), Max:97.5 F (36.4 C)  Recent Labs  Lab 06/30/19 1530 07/03/19 0139  WBC 23.2* 23.2*  CREATININE 0.90 0.78  LATICACIDVEN  --  1.6    Estimated Creatinine Clearance: 65.8 mL/min (by C-G formula based on SCr of 0.78 mg/dL).    Allergies  Allergen Reactions  . Latex Itching    Tape only- REACTED TO BANDAGE ON ABDOMEN  . Tape Itching    Surgical tapes     Antimicrobials this admission: 2/23 Vanc >>  2/23 Cefepime >>   Microbiology results: 2/23 BCx:  2/23 UCx:    MRSA PCR:   Thank you for allowing pharmacy to be a part of this patient's care.  Franky Macho 07/03/2019 6:47 AM

## 2019-07-03 NOTE — ED Notes (Signed)
[facility] called. 

## 2019-07-03 NOTE — ED Notes (Signed)
This tech gave pt pillow.

## 2019-07-03 NOTE — ED Notes (Signed)
Attempted to give report to RN at [facility], was asked to call back in 38min. Will call back at [phone]

## 2019-07-03 NOTE — H&P (Signed)
History and Physical    Jason Malone HFW:[id number] DOB: Nov 10, 1940 DOA: 07/03/2019  PCP: Delsa Grana, PA-C Patient coming from: Home  Chief Complaint: Lower urinary tract symptoms  HPI: Jason Malone is a 79 y.o. male with medical history significant of BPH with LUTS, hypertension, hyperlipidemia, metastatic lung cancer, paroxysmal A. fib currently not on anticoagulation, diet-controlled type 2 diabetes, recent hospital admission in January 2021 for COVID-19 viral pneumonia presenting with complaints of lower urinary tract symptoms.  Patient states he has BPH and takes finasteride and Flomax.  He has not seen a urologist in 5 years.  For the past few days he is having urinary frequency, post micturition dribbling, and significant dysuria.  No fevers or chills.  No nausea, vomiting, or abdominal pain.  States he was admitted for COVID-19 in January and sent home on oxygen.  He continues to use 4 to 5 L of supplemental oxygen in the day as needed and 1 to 2 L at night.  He thinks his breathing is at baseline and denies shortness of breath.  Denies chest pain or cough.  No other complaints.  ED Course: Afebrile.  Tachycardic.  Oxygen saturation 84% on 4 L supplemental oxygen on arrival, improved to low 90s with 6 L supplemental oxygen.  Tachypneic and speaking in short sentences.  Labs showing leukocytosis with WBC count 23.2, persistent compared to labs done 2 weeks ago.  Lactic acid level normal.  Corrected sodium 126, previously normal.  Bicarb 20, anion gap 9.  Blood glucose 204.  BUN 11, creatinine 0.7.  UA not suggestive of infection.  Urine culture pending.  Blood culture x2 pending. Chest x-ray showing worsening interstitial and airspace disease in the right hemithorax with associated volume loss and likely right pleural effusion.  In addition, there is scarring and opacity in the left lung base likely with trace left effusion.  Features suspicious for persistent infection versus recrudescence or  superinfection. CT abdomen pelvis showing a 2.1 cm left lobe liver lesion consistent with known metastatic disease.  Enlarged prostate.  Diverticulosis without diverticulitis.  Progressive right basilar airspace disease consistent with pneumonia.  Trace bilateral pleural effusions. Patient received broad-spectrum antibiotics including vancomycin, cefepime, and metronidazole.  Review of Systems:  All systems reviewed and apart from history of presenting illness, are negative.  Past Medical History:  Diagnosis Date  . Allergic rhinitis   . Benign prostatic hypertrophy with lower urinary tract symptoms (LUTS)   . Chest pain    a. 05/2013 MV: EF 70%, no ischemia.  . Diverticulitis    DIVERTICULOSIS  . Enlarged prostate   . Essential hypertension    CONTROLLED ON MEDS  . Full dentures    upper and lower  . H/O hypokalemia   . Hx of atrial fibrillation without current medication    CARDIOLOGIST-DR [facility]  . 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 Hyperlipemia   . Hypomagnesemia   . IFG (impaired fasting glucose)   . Liver cancer ([place])   . Liver cancer ([place]) 12/08/2016  . Liver mass, left lobe 08/28/2016   Probably hemangioma; will get MRI of liver, refer to GI  . Lung cancer ([place])    a. carcinoid, left lung, Stage 1b (T2a, N0, cM0);  b. 05/2013 s/p VATS & LULobectomy.  . Monocytosis   . Osteopenia   . Paroxysmal atrial flutter (HCC)    a. 03/2013->no recurrence;  b. CHA2DS2VASc = 2-->not currently on anticoagulation;  c. 05/2012 Echo:  EF 55-60%, normal RV.  [name] Wears glasses     Past Surgical History:  Procedure Laterality Date  . BRONCHOSCOPY     04/06/2013  . CARDIOVASCULAR STRESS TEST  05/2013   a. No evidence of ischemia or infarct, EF 70%, no WMAs  . CATARACT EXTRACTION W/PHACO Left 07/14/2015   Procedure: CATARACT EXTRACTION PHACO AND INTRAOCULAR LENS PLACEMENT (IOC);  Surgeon: [name], MD;  Location: [place];  Service:  Ophthalmology;  Laterality: Left;  . CATARACT EXTRACTION W/PHACO Right 10/01/2015   Procedure: CATARACT EXTRACTION PHACO AND INTRAOCULAR LENS PLACEMENT (IOC);  Surgeon: [name], MD;  Location: [place];  Service: Ophthalmology;  Laterality: Right;  . COLONOSCOPY W/ POLYPECTOMY     bleed after-had to go to surgery to stop bleeding via colonoscopy  . COLONOSCOPY WITH PROPOFOL N/A 11/19/2015   Procedure: COLONOSCOPY WITH PROPOFOL;  Surgeon: [name], MD;  Location[facility] ENDOSCOPY;  Service: Endoscopy;  Laterality: N/A;  . DUPUYTREN CONTRACTURE RELEASE  12/15/2011   Procedure: DUPUYTREN CONTRACTURE RELEASE;  Surgeon: [name], MD;  Location: [place];  Service: Orthopedics;  Laterality: Left;  Fasciotomy left ring finger dupuytrens  . FASCIECTOMY Right 11/07/2018   Procedure: SEGMENTAL FASCIECTOMY RIGHT RING FINGER;  Surgeon: Daryll Brod, MD;  Location: [place];  Service: Orthopedics;  Laterality: Right;  AXILLARY BLOCK  . IR ANGIOGRAM SELECTIVE EACH ADDITIONAL VESSEL  07/26/2017  . IR ANGIOGRAM SELECTIVE EACH ADDITIONAL VESSEL  07/26/2017  . IR ANGIOGRAM SELECTIVE EACH ADDITIONAL VESSEL  07/26/2017  . IR ANGIOGRAM SELECTIVE EACH ADDITIONAL VESSEL  07/26/2017  . IR ANGIOGRAM SELECTIVE EACH ADDITIONAL VESSEL  07/26/2017  . IR ANGIOGRAM SELECTIVE EACH ADDITIONAL VESSEL  08/11/2017  . IR ANGIOGRAM SELECTIVE EACH ADDITIONAL VESSEL  04/28/2018  . IR ANGIOGRAM VISCERAL SELECTIVE  07/26/2017  . IR ANGIOGRAM VISCERAL SELECTIVE  08/11/2017  . IR ANGIOGRAM VISCERAL SELECTIVE  04/28/2018  . IR EMBO ARTERIAL NOT HEMORR HEMANG INC GUIDE ROADMAPPING  07/26/2017  . IR EMBO TUMOR ORGAN ISCHEMIA INFARCT INC GUIDE ROADMAPPING  08/11/2017  . IR EMBO TUMOR ORGAN ISCHEMIA INFARCT INC GUIDE ROADMAPPING  04/28/2018  . IR RADIOLOGIST EVAL & MGMT  07/07/2017  . IR RADIOLOGIST EVAL & MGMT  09/07/2017  . IR RADIOLOGIST EVAL & MGMT  12/21/2017  . IR RADIOLOGIST EVAL & MGMT   03/22/2018  . IR RADIOLOGIST EVAL & MGMT  06/08/2018  . IR RADIOLOGIST EVAL & MGMT  11/01/2018  . IR RADIOLOGIST EVAL & MGMT  03/21/2019  . IR US GUIDE VASC ACCESS RIGHT  07/26/2017  . IR US GUIDE VASC ACCESS RIGHT  08/11/2017  . IR US GUIDE VASC ACCESS RIGHT  04/28/2018  . LUNG LOBECTOMY  06/10/13   upper left lung  . MULTIPLE TOOTH EXTRACTIONS    . REMOVAL RETAINED LENS Right 11/17/2015   Procedure: REMOVAL RETAINED LENS FROAGMENTS RIGHT EYE;  Surgeon: [name], MD;  Location: [place];  Service: Ophthalmology;  Laterality: Right;  . TONSILLECTOMY    . UPPER GASTROINTESTINAL ENDOSCOPY  11-03-15   Dr [name]  . VASECTOMY    . VIDEO ASSISTED THORACOSCOPY (VATS)/WEDGE RESECTION Left 06/04/2013   Procedure: VIDEO ASSISTED THORACOSCOPY (VATS)/WEDGE RESECTION;  Surgeon: [name], MD;  Location: [place];  Service: Thoracic;  Laterality: Left;  [name] VIDEO BRONCHOSCOPY N/A 06/04/2013   Procedure: VIDEO BRONCHOSCOPY;  Surgeon: [name], MD;  Location: [place];  Service: Thoracic;  Laterality: N/A;     reports that he  quit smoking about 20 years ago. His smoking use included cigarettes. He has a 40.00 pack-year smoking history. He has never used smokeless tobacco. He reports that he does not drink alcohol or use drugs.  Allergies  Allergen Reactions  . Latex Itching    Tape only- REACTED TO BANDAGE ON ABDOMEN  . Tape Itching    Surgical tapes     Family History  Problem Relation Age of Onset  . Stroke Mother   . Alzheimer's disease Mother   . Hypertension Sister   . Hyperlipidemia Sister   . Hypertension Brother   . Hyperlipidemia Brother   . Diabetes Brother     Prior to Admission medications   Medication Sig Start Date End Date Taking? Authorizing Provider  amLODipine (NORVASC) 2.5 MG tablet Take 1 tablet by mouth once daily Patient taking differently: Take 2.5 mg by mouth daily.  01/01/19  Yes Gollan, Kathlene November, MD  atorvastatin (LIPITOR) 40 MG tablet  TAKE 1 TABLET BY MOUTH ONCE DAILY FOR CHOLESTEROL Patient taking differently: Take 40 mg by mouth daily.  04/11/19  Yes Delsa Grana, PA-C  Calcium Carbonate-Vitamin D3 (CALCIUM 600/VITAMIN D) 600-400 MG-UNIT TABS Take 1 tablet by mouth daily.    Yes [provider]  cholecalciferol (VITAMIN D3) 25 MCG (1000 UNIT) tablet Take 1,000 Units by mouth daily.   Yes [provider]  Cinnamon 500 MG capsule Take 500 mg by mouth at bedtime. BEDTIME   Yes [provider]  finasteride (PROSCAR) 5 MG tablet Take 1 tablet (5 mg total) by mouth daily. 04/11/19  Yes Delsa Grana, PA-C  guaiFENesin-dextromethorphan (ROBITUSSIN DM) 100-10 MG/5ML syrup Take 10 mLs by mouth every 4 (four) hours as needed for cough. 06/17/19  Yes Allie Bossier, MD  Ipratropium-Albuterol (COMBIVENT) 20-100 MCG/ACT AERS respimat Inhale 1 puff into the lungs every 6 (six) hours. 06/17/19  Yes Allie Bossier, MD  LANREOTIDE ACETATE [place] Inject 1 Dose into the skin every 30 (thirty) days.   Yes [provider]  losartan (COZAAR) 100 MG tablet Take 1 tablet (100 mg total) by mouth daily. 04/11/19  Yes Delsa Grana, PA-C  Multiple Vitamins-Minerals (MULTIVITAMIN WITH MINERALS) tablet Take 1 tablet by mouth daily. AM   Yes [provider]  phenazopyridine (PYRIDIUM) 100 MG tablet Take 1 tablet (100 mg total) by mouth 2 (two) times daily as needed for pain. 06/30/19 06/29/20 Yes Triplett, Cari B, FNP  polycarbophil (FIBERCON) 625 MG tablet Take 625 mg by mouth 2 (two) times daily. AM   Yes [provider]  tamsulosin (FLOMAX) 0.4 MG CAPS capsule Take 1 capsule (0.4 mg total) by mouth daily. Patient taking differently: Take 0.4 mg by mouth at bedtime.  04/11/19  Yes Delsa Grana, PA-C  vitamin C (ASCORBIC ACID) 500 MG tablet Take 500 mg by mouth daily. AM   Yes [provider]  zinc sulfate 220 (50 Zn) MG capsule Take 1 capsule (220 mg total) by mouth daily. 06/18/19  Yes Allie Bossier, MD     Physical Exam: Vitals:   07/03/19 0530 07/03/19 0545 07/03/19 0600 07/03/19 0615  BP: (!) 142/71 (!) 157/85 (!) 156/89 (!) 151/78  Pulse: 80 100 98 91  Resp: (!) 25 (!) 29 (!) 29 (!) 27  Temp:      TempSrc:      SpO2: 97% 93% 93% 97%  Weight:      Height:        Physical Exam  Constitutional: He is oriented to  person, place, and time. He appears well-developed and well-nourished. No distress.  HENT:  Head: Normocephalic.  Eyes: Right eye exhibits no discharge. Left eye exhibits no discharge.  Cardiovascular: Normal rate, regular rhythm and intact distal pulses.  Pulmonary/Chest: Effort normal. He has no wheezes. He has no rales.  On 6 L supplemental oxygen  Abdominal: Soft. Bowel sounds are normal. He exhibits no distension. There is no abdominal tenderness. There is no guarding.  Musculoskeletal:        General: No edema.     Cervical back: Neck supple.  Neurological: He is alert and oriented to person, place, and time.  Skin: Skin is warm and dry. He is not diaphoretic.     Labs on Admission: I have personally reviewed following labs and imaging studies  CBC: Recent Labs  Lab 06/30/19 1530 07/03/19 0139  WBC 23.2* 23.2*  NEUTROABS 20.3* 20.0*  HGB 13.3 13.0  HCT 40.0 38.5*  MCV 91.5 90.8  PLT 181 462*   Basic Metabolic Panel: Recent Labs  Lab 06/30/19 1530 07/03/19 0139  NA 133* 124*  K 4.3 3.5  CL 98 95*  CO2 25 20*  GLUCOSE 231* 204*  BUN 13 11  CREATININE 0.90 0.78  CALCIUM 8.5* 8.0*   GFR: Estimated Creatinine Clearance: 65.8 mL/min (by C-G formula based on SCr of 0.78 mg/dL). Liver Function Tests: Recent Labs  Lab 06/30/19 1530 07/03/19 0139  AST 26 32  ALT 35 34  ALKPHOS 111 133*  BILITOT 0.9 0.9  PROT 5.9* 5.8*  ALBUMIN 2.4* 2.2*   No results for input(s): LIPASE, AMYLASE in the last 168 hours. No results for input(s): AMMONIA in the last 168 hours. Coagulation Profile: Recent Labs  Lab 07/03/19 0139  INR 1.1   Cardiac  Enzymes: No results for input(s): CKTOTAL, CKMB, CKMBINDEX, TROPONINI in the last 168 hours. BNP (last 3 results) No results for input(s): PROBNP in the last 8760 hours. HbA1C: No results for input(s): HGBA1C in the last 72 hours. CBG: No results for input(s): GLUCAP in the last 168 hours. Lipid Profile: No results for input(s): CHOL, HDL, LDLCALC, TRIG, CHOLHDL, LDLDIRECT in the last 72 hours. Thyroid Function Tests: No results for input(s): TSH, T4TOTAL, FREET4, T3FREE, THYROIDAB in the last 72 hours. Anemia Panel: No results for input(s): VITAMINB12, FOLATE, FERRITIN, TIBC, IRON, RETICCTPCT in the last 72 hours. Urine analysis:    Component Value Date/Time   COLORURINE YELLOW 07/03/2019 0124   APPEARANCEUR CLEAR 07/03/2019 0124   APPEARANCEUR Clear 12/17/2016 1423   LABSPEC 1.018 07/03/2019 0124   PHURINE 6.0 07/03/2019 0124   GLUCOSEU NEGATIVE 07/03/2019 0124   HGBUR NEGATIVE 07/03/2019 0124   BILIRUBINUR NEGATIVE 07/03/2019 0124   BILIRUBINUR Negative 12/17/2016 1423   KETONESUR 20 (A) 07/03/2019 0124   PROTEINUR NEGATIVE 07/03/2019 0124   UROBILINOGEN 0.2 05/31/2013 1545   NITRITE NEGATIVE 07/03/2019 0124   LEUKOCYTESUR NEGATIVE 07/03/2019 0124    Radiological Exams on Admission: CT ABDOMEN PELVIS W CONTRAST  Result Date: 07/03/2019 CLINICAL DATA:  Dysuria, urinary frequency, history of lung and liver cancer EXAM: CT ABDOMEN AND PELVIS WITH CONTRAST TECHNIQUE: Multidetector CT imaging of the abdomen and pelvis was performed using the standard protocol following bolus administration of intravenous contrast. CONTRAST:  124mL OMNIPAQUE IOHEXOL 300 MG/ML  SOLN COMPARISON:  06/11/2019, 03/15/2019 FINDINGS: Lower chest: There is progressive consolidation at the right lung base consistent with persistent pneumonia. Trace bilateral pleural effusions have developed in the interim. Hepatobiliary: 2.1 cm mass within the lateral segment  left lobe liver, reference image 20, consistent  with known metastatic disease. No other discrete liver abnormalities are identified. Calcified gallstones are seen without cholecystitis. Pancreas: Unremarkable. No pancreatic ductal dilatation or surrounding inflammatory changes. Spleen: Normal in size without focal abnormality. Adrenals/Urinary Tract: There is mild diffuse left renal atrophy, stable. Chronic scarring lateral aspect right kidney. Bladder is moderately distended without focal abnormality. The adrenals are normal. Stomach/Bowel: Normal appendix right lower quadrant. No bowel obstruction or ileus. Scattered diverticulosis of the distal colon without diverticulitis. Vascular/Lymphatic: Aortic atherosclerosis. No enlarged abdominal or pelvic lymph nodes. Reproductive: Prostate is enlarged measuring 5.6 x 5.6 cm in transverse dimension. Other: No abdominal wall hernia or abnormality. No abdominopelvic ascites. Musculoskeletal: There are no acute or destructive bony lesions. Punctate sclerotic foci within the lumbar spine and bony pelvis are stable likely reflecting bone islands. Reconstructed images demonstrate no additional findings. IMPRESSION: 1. Progressive right basilar airspace disease consistent with pneumonia. 2. Trace bilateral pleural effusions. 3. 2.1 cm left lobe liver lesion consistent with known metastatic disease. 4. Enlarged prostate. 5. Diverticulosis without diverticulitis. Electronically Signed   By: Randa Ngo M.D.   On: 07/03/2019 03:46   DG Chest Port 1 View  Result Date: 07/03/2019 CLINICAL DATA:  Urinary frequency EXAM: PORTABLE CHEST 1 VIEW COMPARISON:  CT 06/11/2019, radiograph 06/10/2019 FINDINGS: Worsening consolidative and interstitial opacity throughout the right hemithorax. Additional reticular changes in the left lung base likely reflecting scarring with associated tenting of the left hemidiaphragm. Suspect at least small right effusion and likely left effusion as well. No pneumothorax. Prominence of the cardiac  silhouette may be related to portable technique. Calcified aorta is noted. Coronary calcification versus stent overlying the left heart border. No acute osseous or soft tissue abnormality. Degenerative changes are present in the imaged spine and shoulders. IMPRESSION: Worsening interstitial and airspace disease in the right hemithorax with associated volume loss and likely right pleural effusion. Additional scarring and opacity in the left lung base is present as well likely with trace left effusion. Features could reflect persistent infection versus recrudescence or superinfection. Aortic Atherosclerosis (ICD10-I70.0). Mild prominence of the cardiac silhouette, possibly related to portable technique. Electronically Signed   By: Lovena Le M.D.   On: 07/03/2019 01:51    EKG: Independently reviewed.  Sinus tachycardia, nonspecific ST abnormality.  Assessment/Plan Principal Problem:   Pneumonia due to COVID-19 virus Active Problems:   Respiratory failure (HCC)   Hyponatremia   Metabolic acidosis   BPH (benign prostatic hyperplasia)   SIRS and acute on subacute hypoxic respiratory failure secondary to persistent COVID-19 viral pneumonia versus recrudescence or superinfection: Diagnosed with COVID-19 on 1/28 and was treated with full course of remdesivir, Decadron, and convalescent plasma.  Now presenting with worsening hypoxia and worsening airspace disease on imaging. -Continue antibiotic coverage with vancomycin and cefepime. Order MRSA PCR screen. Check PCT level. -IV Decadron 6 mg daily -Vitamin C, zinc -Antitussives as needed -Tylenol as needed -Albuterol inhaler as needed -Flutter valve, incentive spirometry -Check D-dimer level, if elevated CT angiogram to rule out PE given increased risk of thromboembolism with COVID-19 infection -Check inflammatory markers including ferritin, fibrinogen, CRP, LDH -Continue airborne and contact precautions -Continuous pulse ox -Supplemental oxygen as  needed to keep oxygen saturation above 90% -Blood culture x2 pending -Continue to monitor WBC count  Hyponatremia: Corrected sodium 126, previously normal.  Not on thiazide diuretic.  Not endorsing vomiting or diarrhea.  Give gentle IV fluid hydration.  Check serum osmolarity.  Continue to monitor sodium  level.  Normal anion gap metabolic acidosis: Bicarb 20, anion gap 9.  Not on a diuretic and not endorsing diarrhea.  Give gentle IV fluid hydration and continue to monitor.  BPH with LUTS: UA not suggestive of infection.  Urine culture pending.  Continue home Flomax, finasteride, and Pyridium.  Consult urology in a.m.  Endobronchial mass, history of metastatic lung cancer: CT angiogram chest done on 06/11/2019 showing a 2 cm left main endobronchial mass close to a left upper lobectomy margin concerning for recurrence of LUL carcinoid tumor.  Hospitalist Dr. Dia Crawford had discussed the case with patient's oncologist Dr. Delight Hoh who had treated the patient for stage IV lung cancer.  He recommended outpatient follow-up as soon as possible to obtain biopsy of the endotracheal mass.  Please ensure oncology follow-up.  Hypertension: Stable.  Continue home amlodipine.  Hyperlipidemia: Continue home Lipitor  Diet-controlled type 2 diabetes: A1c 6.8 on 06/14/2019.  Sliding scale insulin sensitive ACHS and CBG monitoring.  DVT prophylaxis: Lovenox Code Status: Patient wishes to be full code. Family Communication: No family available at this time. Disposition Plan: Anticipate discharge after clinical improvement. Consults called: None Admission status: It is my clinical opinion that admission to INPATIENT is reasonable and necessary in this 79 y.o. male . presenting with SIRS and acute on subacute hypoxic respiratory failure secondary to persistent COVID-19 viral pneumonia versus recrudescence or superinfection. Very high risk of decompensation.   Given the aforementioned, the predictability  of an adverse outcome is felt to be significant. I expect that the patient will require at least 2 midnights in the hospital to treat this condition.   The medical decision making on this patient was of high complexity and the patient is at high risk for clinical deterioration, therefore this is a level 3 visit.   Shela Leff MD [facility]  If 7PM-7AM, please contact night-coverage [email or web address] Password [facility]  07/03/2019, 6:47 AM

## 2019-07-03 NOTE — ED Notes (Signed)
Bladder scan showed 900 ml. Foley catheter placed.

## 2019-07-03 NOTE — ED Notes (Signed)
Pt able to update family by personal cellphone

## 2019-07-03 NOTE — ED Notes (Signed)
Pt able to call & update family using personal cell phone

## 2019-07-04 DIAGNOSIS — R338 Other retention of urine: Secondary | ICD-10-CM

## 2019-07-04 DIAGNOSIS — J9601 Acute respiratory failure with hypoxia: Secondary | ICD-10-CM

## 2019-07-04 DIAGNOSIS — N401 Enlarged prostate with lower urinary tract symptoms: Secondary | ICD-10-CM

## 2019-07-04 LAB — CBC WITH DIFFERENTIAL/PLATELET
Abs Immature Granulocytes: 0.21 10*3/uL — ABNORMAL HIGH (ref 0.00–0.07)
Basophils Absolute: 0 10*3/uL (ref 0.0–0.1)
Basophils Relative: 0 %
Eosinophils Absolute: 0 10*3/uL (ref 0.0–0.5)
Eosinophils Relative: 0 %
HCT: 33.4 % — ABNORMAL LOW (ref 39.0–52.0)
Hemoglobin: 11.3 g/dL — ABNORMAL LOW (ref 13.0–17.0)
Immature Granulocytes: 1 %
Lymphocytes Relative: 2 %
Lymphs Abs: 0.4 10*3/uL — ABNORMAL LOW (ref 0.7–4.0)
MCH: 30.8 pg (ref 26.0–34.0)
MCHC: 33.8 g/dL (ref 30.0–36.0)
MCV: 91 fL (ref 80.0–100.0)
Monocytes Absolute: 2.1 10*3/uL — ABNORMAL HIGH (ref 0.1–1.0)
Monocytes Relative: 10 %
Neutro Abs: 18.1 10*3/uL — ABNORMAL HIGH (ref 1.7–7.7)
Neutrophils Relative %: 87 %
Platelets: 135 10*3/uL — ABNORMAL LOW (ref 150–400)
RBC: 3.67 MIL/uL — ABNORMAL LOW (ref 4.22–5.81)
RDW: 15 % (ref 11.5–15.5)
WBC: 20.8 10*3/uL — ABNORMAL HIGH (ref 4.0–10.5)
nRBC: 0 % (ref 0.0–0.2)

## 2019-07-04 LAB — COMPREHENSIVE METABOLIC PANEL
ALT: 27 U/L (ref 0–44)
AST: 21 U/L (ref 15–41)
Albumin: 1.8 g/dL — ABNORMAL LOW (ref 3.5–5.0)
Alkaline Phosphatase: 95 U/L (ref 38–126)
Anion gap: 8 (ref 5–15)
BUN: 17 mg/dL (ref 8–23)
CO2: 25 mmol/L (ref 22–32)
Calcium: 8.3 mg/dL — ABNORMAL LOW (ref 8.9–10.3)
Chloride: 106 mmol/L (ref 98–111)
Creatinine, Ser: 0.78 mg/dL (ref 0.61–1.24)
GFR calc Af Amer: >60 mL/min (ref >60)
GFR calc non Af Amer: >60 mL/min (ref >60)
Glucose, Bld: 141 mg/dL — ABNORMAL HIGH (ref 70–99)
Potassium: 3.6 mmol/L (ref 3.5–5.1)
Sodium: 139 mmol/L (ref 135–145)
Total Bilirubin: 0.8 mg/dL (ref 0.3–1.2)
Total Protein: 5 g/dL — ABNORMAL LOW (ref 6.5–8.1)

## 2019-07-04 LAB — URINE CULTURE: Culture: 20000 — AB

## 2019-07-04 LAB — PROCALCITONIN: Procalcitonin: <0.1 ng/mL

## 2019-07-04 LAB — GLUCOSE, CAPILLARY
Glucose-Capillary: 127 mg/dL — ABNORMAL HIGH (ref 70–99)
Glucose-Capillary: 115 mg/dL — ABNORMAL HIGH (ref 70–99)
Glucose-Capillary: 212 mg/dL — ABNORMAL HIGH (ref 70–99)
Glucose-Capillary: 112 mg/dL — ABNORMAL HIGH (ref 70–99)

## 2019-07-04 LAB — D-DIMER, QUANTITATIVE: D-Dimer, Quant: 2.59 ug/mL-FEU — ABNORMAL HIGH (ref 0.00–0.50)

## 2019-07-04 LAB — MAGNESIUM: Magnesium: 2.1 mg/dL (ref 1.7–2.4)

## 2019-07-04 LAB — C-REACTIVE PROTEIN: CRP: 12.5 mg/dL — ABNORMAL HIGH (ref <1.0)

## 2019-07-04 LAB — BRAIN NATRIURETIC PEPTIDE: B Natriuretic Peptide: 168.2 pg/mL — ABNORMAL HIGH (ref 0.0–100.0)

## 2019-07-04 MED ORDER — SODIUM CHLORIDE 0.9 % IV SOLN
2.0000 g | INTRAVENOUS | Status: DC
  Administered 2019-07-04 – 2019-07-05 (×2): 2 g via INTRAVENOUS
  Filled 2019-07-04: qty 20
  Filled 2019-07-04 (×2): qty 2

## 2019-07-04 MED ORDER — TAMSULOSIN HCL 0.4 MG PO CAPS
0.8000 mg | ORAL_CAPSULE | Freq: Every day | ORAL | Status: DC
  Administered 2019-07-04 – 2019-07-05 (×2): 0.8 mg via ORAL
  Filled 2019-07-04 (×2): qty 2

## 2019-07-04 MED ORDER — AZITHROMYCIN 250 MG PO TABS
500.0000 mg | ORAL_TABLET | Freq: Every day | ORAL | Status: DC
  Administered 2019-07-04 – 2019-07-06 (×3): 500 mg via ORAL
  Filled 2019-07-04 (×3): qty 2

## 2019-07-04 MED ORDER — METHYLPREDNISOLONE SODIUM SUCC 40 MG IJ SOLR
40.0000 mg | Freq: Three times a day (TID) | INTRAMUSCULAR | Status: DC
  Administered 2019-07-04 – 2019-07-05 (×4): 40 mg via INTRAVENOUS
  Filled 2019-07-04 (×4): qty 1

## 2019-07-04 NOTE — Plan of Care (Signed)
  Problem: Activity: Goal: Risk for activity intolerance will decrease Outcome: Not Progressing  Pt remains SOB with minimal exertion

## 2019-07-04 NOTE — Evaluation (Signed)
Physical Therapy Evaluation Patient Details Name: Jason Malone MRN: [id number] DOB: 02-May-1941 Today's Date: 07/04/2019   History of Present Illness  Pt is 79 yo male with PMH significant of BPH with LUTS, HTN, hyperlipidemia, metastatic lung cancer, afib, and DM2, recent hospital admission in Jan 2021 for COVID 19 viral PNE.  Pt admitted with pne and urinary retention.  Clinical Impression  Pt admitted with above diagnosis. Pt able to ambulate and transfer with supervision/min guard for safety.  Pt's greatest limitation is DOE and decreasing O2 sats with activity.  He was on 6 LPM O2 with sats down to 85% with 24' of ambulation taking 1 minute to recover. Pt currently with functional limitations due to the deficits listed below (see PT Problem List). Pt will benefit from skilled PT to increase their independence and safety with mobility to allow discharge to the venue listed below.       Follow Up Recommendations Home health PT;Supervision - Intermittent    Equipment Recommendations  Rolling walker with 5" wheels    Recommendations for Other Services       Precautions / Restrictions Precautions Precautions: Fall Precaution Comments: Watch O2      Mobility  Bed Mobility               General bed mobility comments: pt sitting in recliner at therapist arrival  Transfers Overall transfer level: Needs assistance Equipment used: None Transfers: Sit to/from Stand Sit to Stand: Supervision         General transfer comment: performed x 3  Ambulation/Gait Ambulation/Gait assistance: Min guard Gait Distance (Feet): 24 Feet(x3) Assistive device: Rolling walker (2 wheeled) Gait Pattern/deviations: Step-through pattern     General Gait Details: fatigued easily; required sitting rest breaks; DOE 3/4  Stairs            Wheelchair Mobility    Modified Rankin (Stroke Patients Only)       Balance Overall balance assessment: Needs assistance Sitting-balance  support: Feet supported Sitting balance-Leahy Scale: Normal     Standing balance support: During functional activity Standing balance-Leahy Scale: Fair                               Pertinent Vitals/Pain Pain Assessment: No/denies pain    Home Living Family/patient expects to be discharged to:: Private residence Living Arrangements: Spouse/significant other Available Help at Discharge: Family;Available 24 hours/day Type of Home: House Home Access: Stairs to enter Entrance Stairs-Rails: None Entrance Stairs-Number of Steps: 2 Home Layout: One level Home Equipment: None      Prior Function Level of Independence: Independent;Needs assistance      ADL's / Homemaking Assistance Needed: Independent prior to COVID, since then having difficulty bathing self due to endurance.  Comments: Prior to having COVID in January he  walked on treadmill (20 mins twice a day).  Since then just ambulating in house and on O2 and had HHPT.     Hand Dominance        Extremity/Trunk Assessment   Upper Extremity Assessment Upper Extremity Assessment: Overall WFL for tasks assessed    Lower Extremity Assessment Lower Extremity Assessment: LLE deficits/detail;RLE deficits/detail RLE Deficits / Details: ROM WFL; MMT 5/5 LLE Deficits / Details: ROM WFL; MMT 5/5       Communication   Communication: HOH  Cognition Arousal/Alertness: Awake/alert Behavior During Therapy: WFL for tasks assessed/performed Overall Cognitive Status: Within Functional Limits for tasks assessed  General Comments General comments (skin integrity, edema, etc.): Pt on 6 LPM O2 with sats 96% rest.  Sats during ambulation 90% but dropped to 85% during recovery.  Took ~ 1 min to recover after each bout of ambulation.    Exercises     Assessment/Plan    PT Assessment Patient needs continued PT services  PT Problem List Decreased strength;Decreased  activity tolerance;Decreased balance;Decreased mobility;Decreased coordination;Decreased knowledge of use of DME;Decreased safety awareness;Cardiopulmonary status limiting activity       PT Treatment Interventions DME instruction;Gait training;Functional mobility training;Therapeutic activities;Therapeutic exercise;Balance training;Neuromuscular re-education;Patient/family education;Stair training    PT Goals (Current goals can be found in the Care Plan section)  Acute Rehab PT Goals Patient Stated Goal: to get better and go home to spouse PT Goal Formulation: With patient Time For Goal Achievement: 07/18/19 Potential to Achieve Goals: Good    Frequency Min 3X/week   Barriers to discharge        Co-evaluation               AM-PAC PT "6 Clicks" Mobility  Outcome Measure Help needed turning from your back to your side while in a flat bed without using bedrails?: None Help needed moving from lying on your back to sitting on the side of a flat bed without using bedrails?: None Help needed moving to and from a bed to a chair (including a wheelchair)?: None Help needed standing up from a chair using your arms (e.g., wheelchair or bedside chair)?: None Help needed to walk in hospital room?: A Little Help needed climbing 3-5 steps with a railing? : A Little 6 Click Score: 22    End of Session Equipment Utilized During Treatment: Oxygen;Gait belt Activity Tolerance: Patient limited by fatigue;Treatment limited secondary to medical complications (Comment)(O2 sats) Patient left: in chair;with call bell/phone within reach Nurse Communication: Mobility status PT Visit Diagnosis: Unsteadiness on feet (R26.81);Other abnormalities of gait and mobility (R26.89)    Time: 8295-6213 PT Time Calculation (min) (ACUTE ONLY): 29 min   Charges:   PT Evaluation $PT Eval Low Complexity: 1 Low PT Treatments $Gait Training: 8-22 mins        Maggie Font, PT Acute Rehab Services Pager  [phone] [facility] [phone] [facility] [phone]   Karlton Lemon 07/04/2019, 2:27 PM

## 2019-07-04 NOTE — Progress Notes (Signed)
PROGRESS NOTE                                                                                                                                                                                                             Patient Demographics:    Jason Malone, is a 79 y.o. male, DOB - 1941/02/09, CBS:[id number]  Outpatient Primary MD for the patient is Delsa Grana, PA-C    LOS - 1  Admit date - 07/03/2019    Chief Complaint  Patient presents with  . Urinary Frequency  . Shortness of Breath       Brief Narrative    Jason Malone is a 79 y.o. male with medical history significant of BPH with LUTS, hypertension, hyperlipidemia, metastatic lung cancer, paroxysmal A. fib currently not on anticoagulation, diet-controlled type 2 diabetes, recent hospital admission in January 2021 for COVID-19 viral pneumonia presenting with complaints of lower urinary tract symptoms.  His main complaint was inability to pass urine and making less urine, his work-up suggested some pneumonia and urinary retention, he was admitted to the hospital for further care.   Subjective:    Jason Malone today has, No headache, No chest pain, No abdominal pain - No Nausea, he does report some cough.   Assessment  & Plan :     History of BPH with urinary retention.  -Patient with evidence of urinary retention, had 1000 cc post void on bladder scan, required Foley catheter insertion . -We will increase his Flomax dose . -We will attempt voiding trial today for ambulation and activity , patient reports history of previous incision where he required to be discharged home with Foley catheter after prolonged hospital stay . -Continue with IV antibiotics for Proteus in the urine .   COPD for patient/pneumonia with history of recent fully treated COVID-19 pneumonia  - do not think he has any signs of active COVID-19 infection, he is beyond 21 days of  quarantine. -Patient with significant wheezing today, will start on IV steroids, continue with Rocephin and azithromycin . - Encouraged the patient to sit up in chair in the daytime use I-S and flutter valve for pulmonary toiletry and then prone in bed when at night.    SpO2: 96 % O2 Flow Rate (L/min): 4 L/min  Recent Labs  Lab 07/03/19 0705 07/04/19 0320  CRP  --  12.5*  DDIMER 2.68* 2.59*  FERRITIN 453*  --   BNP  --  168.2*  PROCALCITON 0.11 <0.10    Hepatic Function Latest Ref Rng & Units 07/04/2019 07/03/2019 06/30/2019  Total Protein 6.5 - 8.1 g/dL 5.0(L) 5.8(L) 5.9(L)  Albumin 3.5 - 5.0 g/dL 1.8(L) 2.2(L) 2.4(L)  AST 15 - 41 U/L 21 32 26  ALT 0 - 44 U/L 27 34 35  Alk Phosphatase 38 - 126 U/L 95 133(H) 111  Total Bilirubin 0.3 - 1.2 mg/dL 0.8 0.9 0.9  Bilirubin, Direct 0.0 - 0.2 mg/dL - - -      Hyponatremia.   -Appears dehydrated, will provide IV fluids and check urine and serum electrolytes and osmolality, will also check serum uric acid.  Hypertension.   -Continue Norvasc.  History of stage IVa neuroendocrine tumor  - recent finding of endobronchial mass found last admission patient is following with Dr. Grayland Ormond as an outpatient, he was referred to pulmonary for biopsy   dyslipidemia.  On statin.  Diet-controlled DM 2 .  Recent A1c 6.8 on 06/14/2019.  ISS.  CBG (last 3)  Recent Labs    07/03/19 2111 07/04/19 0749 07/04/19 1132  GLUCAP 203* 127* 115*     Condition - Fair  Family Communication  : none at bedside  Code Status :  Full  Diet :   Diet Order            Diet Carb Modified Fluid consistency: Thin; Room service appropriate? Yes  Diet effective now               Disposition Plan  : In the hospital getting treatment for pneumonia with IV antibiotics  Consults  :  None  Procedures  :    CT Abdomen and pelvis -  basilar infiltrate, enlarged prostate.  PUD Prophylaxis :  None  DVT Prophylaxis  :  Lovenox   Lab Results   Component Value Date   PLT 135 (L) 07/04/2019    Inpatient Medications  Scheduled Meds: . amLODipine  2.5 mg Oral Daily  . vitamin C  500 mg Oral Daily  . atorvastatin  40 mg Oral Daily  . Chlorhexidine Gluconate Cloth  6 each Topical Daily  . enoxaparin (LOVENOX) injection  40 mg Subcutaneous Q24H  . finasteride  5 mg Oral Daily  . insulin aspart  0-5 Units Subcutaneous QHS  . insulin aspart  0-9 Units Subcutaneous TID WC  . methylPREDNISolone (SOLU-MEDROL) injection  40 mg Intravenous Q8H  . polycarbophil  625 mg Oral BID  . tamsulosin  0.8 mg Oral QHS  . zinc sulfate  220 mg Oral Daily   Continuous Infusions: . ceFEPime (MAXIPIME) IV 2 g (07/04/19 1430)   PRN Meds:.acetaminophen, albuterol, chlorpheniramine-HYDROcodone, guaiFENesin-dextromethorphan, phenazopyridine  Antibiotics  :    Anti-infectives (From admission, onward)   Start     Dose/Rate Route Frequency Ordered Stop   07/04/19 0500  vancomycin (VANCOREADY) IVPB 1500 mg/300 mL  Status:  Discontinued     1,500 mg 150 mL/hr over 120 Minutes Intravenous Every 24 hours 07/03/19 0654 07/03/19 1029   07/03/19 1100  ceFEPIme (MAXIPIME) 2 g in sodium chloride 0.9 % 100 mL IVPB     2 g 200 mL/hr over 30 Minutes Intravenous Every 8 hours 07/03/19 0654     07/03/19 0145  vancomycin (VANCOREADY) IVPB 1250 mg/250 mL     1,250 mg 166.7 mL/hr over 90 Minutes Intravenous  Once 07/03/19 0128 07/03/19 0419   07/03/19 0130  ceFEPIme (MAXIPIME) 2 g in sodium chloride 0.9 % 100 mL IVPB     2 g 200 mL/hr over 30 Minutes Intravenous  Once 07/03/19 0124 07/03/19 0229   07/03/19 0130  metroNIDAZOLE (FLAGYL) IVPB 500 mg     500 mg 100 mL/hr over 60 Minutes Intravenous  Once 07/03/19 0124 07/03/19 0337   07/03/19 0130  vancomycin (VANCOCIN) IVPB 1000 mg/200 mL premix  Status:  Discontinued     1,000 mg 200 mL/hr over 60 Minutes Intravenous  Once 07/03/19 0124 07/03/19 0128       Jarvin Ogren M.D on 07/04/2019 at 3:07 PM  To  page go to [email or web address] - password [facility]  [facility] -  Office  [phone]    See all Orders from today for further details    Objective:   Vitals:   07/03/19 1330 07/03/19 1435 07/03/19 2335 07/04/19 0752  BP: (!) 147/69 132/75 138/65 (!) 142/64  Pulse: 71 81 75 64  Resp: (!) 26 (!) 22  20  Temp:  (!) 97.5 F (36.4 C) 98.4 F (36.9 C) 98 F (36.7 C)  TempSrc:  Oral    SpO2: 94% 91% 96% 96%  Weight:      Height:        Wt Readings from Last 3 Encounters:  07/03/19 65.8 kg  06/30/19 65.8 kg  06/25/19 65.8 kg     Intake/Output Summary (Last 24 hours) at 07/04/2019 1507 Last data filed at 07/04/2019 7622 Gross per 24 hour  Intake 1649 ml  Output 2400 ml  Net -751 ml     Physical Exam  Awake Alert, Oriented X 3, No new F.N deficits, Normal affect Symmetrical Chest wall movement, Good air movement bilaterally, CTAB RRR,No Gallops,Rubs or new Murmurs, No Parasternal Heave +ve B.Sounds, Abd Soft, No tenderness, No rebound - guarding or rigidity. No Cyanosis, Clubbing or edema, No new Rash or bruise       Data Review:    CBC Recent Labs  Lab 06/30/19 1530 07/03/19 0139 07/04/19 0320  WBC 23.2* 23.2* 20.8*  HGB 13.3 13.0 11.3*  HCT 40.0 38.5* 33.4*  PLT 181 141* 135*  MCV 91.5 90.8 91.0  MCH 30.4 30.7 30.8  MCHC 33.3 33.8 33.8  RDW 14.8 14.3 15.0  LYMPHSABS 0.5* 0.4* 0.4*  MONOABS 2.1* 2.4* 2.1*  EOSABS 0.0 0.0 0.0  BASOSABS 0.1 0.0 0.0    Chemistries  Recent Labs  Lab 06/30/19 1530 07/03/19 0139 07/04/19 0320  NA 133* 124* 139  K 4.3 3.5 3.6  CL 98 95* 106  CO2 25 20* 25  GLUCOSE 231* 204* 141*  BUN '13 11 17  '$ CREATININE 0.90 0.78 0.78  CALCIUM 8.5* 8.0* 8.3*  MG  --   --  2.1  AST 26 32 21  ALT 35 34 27  ALKPHOS 111 133* 95  BILITOT 0.9 0.9 0.8   ------------------------------------------------------------------------------------------------------------------ No results for input(s): CHOL, HDL, LDLCALC, TRIG, CHOLHDL,  LDLDIRECT in the last 72 hours.  Lab Results  Component Value Date   HGBA1C 6.8 (H) 06/14/2019   ------------------------------------------------------------------------------------------------------------------ No results for input(s): TSH, T4TOTAL, T3FREE, THYROIDAB in the last 72 hours.  Invalid input(s): FREET3  Cardiac Enzymes No results for input(s): CKMB, TROPONINI, MYOGLOBIN in the last 168 hours.  Invalid input(s): CK ------------------------------------------------------------------------------------------------------------------    Component Value Date/Time   BNP 168.2 (H) 07/04/2019 0320    Micro Results Recent Results (from the past 240 hour(s))  Urine culture     Status: Abnormal  Collection Time: 07/03/19  1:24 AM   Specimen: In/Out Cath Urine  Result Value Ref Range Status   Specimen Description IN/OUT CATH URINE  Final   Special Requests   Final    NONE Performed at [facility], [place] [address], [place], [place] [address]    Culture 20,000 COLONIES/mL PROTEUS MIRABILIS (A)  Final   Report Status 07/04/2019 FINAL  Final   Organism ID, Bacteria PROTEUS MIRABILIS (A)  Final      Susceptibility   Proteus mirabilis - MIC*    AMPICILLIN <=2 SENSITIVE Sensitive     CEFAZOLIN <=4 SENSITIVE Sensitive     CEFTRIAXONE <=0.25 SENSITIVE Sensitive     CIPROFLOXACIN <=0.25 SENSITIVE Sensitive     GENTAMICIN <=1 SENSITIVE Sensitive     IMIPENEM 4 SENSITIVE Sensitive     NITROFURANTOIN 128 RESISTANT Resistant     TRIMETH/SULFA <=20 SENSITIVE Sensitive     AMPICILLIN/SULBACTAM <=2 SENSITIVE Sensitive     PIP/TAZO <=4 SENSITIVE Sensitive     * 20,000 COLONIES/mL PROTEUS MIRABILIS    Radiology Reports DG Chest 2 View  Result Date: 06/07/2019 CLINICAL DATA:  Cough EXAM: CHEST - 2 VIEW COMPARISON:  2019 FINDINGS: The heart size and mediastinal contours are within normal limits. Postoperative changes at the left hilum. No new consolidation or edema. Right mid  lung calcified granuloma. Degenerative changes of the spine. IMPRESSION: No acute process in the chest Electronically Signed   By: Macy Mis M.D.   On: 06/07/2019 11:31   CT ANGIO CHEST PE W OR WO CONTRAST  Result Date: 06/11/2019 CLINICAL DATA:  Shortness of breath.  COVID positive. EXAM: CT ANGIOGRAPHY CHEST WITH CONTRAST TECHNIQUE: Multidetector CT imaging of the chest was performed using the standard protocol during bolus administration of intravenous contrast. Multiplanar CT image reconstructions and MIPs were obtained to evaluate the vascular anatomy. CONTRAST:  24m OMNIPAQUE IOHEXOL 350 MG/ML SOLN COMPARISON:  06/27/2016 FINDINGS: Cardiovascular: Normal heart size. No pericardial effusion. Atherosclerotic calcification of the aorta and coronaries. No pulmonary artery filling defect Mediastinum/Nodes: Negative for adenopathy or mass. Lungs/Pleura: Left upper lobectomy for bronchial carcinoid per chart. There is ground-glass opacity with a subpleural predilection in the right more than left lung consistent with history of COVID 19 positivity. Also, there is a irregular lobulated endobronchial mass at the level of the distal left mainstem bronchus, 2 cm in length. Bronchus appears somewhat thin along the esophageal margin but no clear esophageal fistulization. Upper Abdomen: Cholelithiasis. History of treated liver metastases, reference MRI 03/15/2019. Musculoskeletal: Spondylosis. Rounded sclerotic focus in the T11 vertebral body is stable from 2019. Review of the MIP images confirms the above findings. IMPRESSION: 1. Bilateral ground-glass pneumonia consistent with COVID-19 positivity. 2. 2 cm left main endobronchial mass close to a left upper lobectomy margin, presumably local recurrence of bronchial carcinoid. Electronically Signed   By: JMonte FantasiaM.D.   On: 06/11/2019 04:28   CT ABDOMEN PELVIS W CONTRAST  Result Date: 07/03/2019 CLINICAL DATA:  Dysuria, urinary frequency, history of lung  and liver cancer EXAM: CT ABDOMEN AND PELVIS WITH CONTRAST TECHNIQUE: Multidetector CT imaging of the abdomen and pelvis was performed using the standard protocol following bolus administration of intravenous contrast. CONTRAST:  1047mOMNIPAQUE IOHEXOL 300 MG/ML  SOLN COMPARISON:  06/11/2019, 03/15/2019 FINDINGS: Lower chest: There is progressive consolidation at the right lung base consistent with persistent pneumonia. Trace bilateral pleural effusions have developed in the interim. Hepatobiliary: 2.1 cm mass within the lateral segment left lobe liver,  reference image 20, consistent with known metastatic disease. No other discrete liver abnormalities are identified. Calcified gallstones are seen without cholecystitis. Pancreas: Unremarkable. No pancreatic ductal dilatation or surrounding inflammatory changes. Spleen: Normal in size without focal abnormality. Adrenals/Urinary Tract: There is mild diffuse left renal atrophy, stable. Chronic scarring lateral aspect right kidney. Bladder is moderately distended without focal abnormality. The adrenals are normal. Stomach/Bowel: Normal appendix right lower quadrant. No bowel obstruction or ileus. Scattered diverticulosis of the distal colon without diverticulitis. Vascular/Lymphatic: Aortic atherosclerosis. No enlarged abdominal or pelvic lymph nodes. Reproductive: Prostate is enlarged measuring 5.6 x 5.6 cm in transverse dimension. Other: No abdominal wall hernia or abnormality. No abdominopelvic ascites. Musculoskeletal: There are no acute or destructive bony lesions. Punctate sclerotic foci within the lumbar spine and bony pelvis are stable likely reflecting bone islands. Reconstructed images demonstrate no additional findings. IMPRESSION: 1. Progressive right basilar airspace disease consistent with pneumonia. 2. Trace bilateral pleural effusions. 3. 2.1 cm left lobe liver lesion consistent with known metastatic disease. 4. Enlarged prostate. 5. Diverticulosis  without diverticulitis. Electronically Signed   By: Randa Ngo M.D.   On: 07/03/2019 03:46   DG Chest Port 1 View  Result Date: 07/03/2019 CLINICAL DATA:  Urinary frequency EXAM: PORTABLE CHEST 1 VIEW COMPARISON:  CT 06/11/2019, radiograph 06/10/2019 FINDINGS: Worsening consolidative and interstitial opacity throughout the right hemithorax. Additional reticular changes in the left lung base likely reflecting scarring with associated tenting of the left hemidiaphragm. Suspect at least small right effusion and likely left effusion as well. No pneumothorax. Prominence of the cardiac silhouette may be related to portable technique. Calcified aorta is noted. Coronary calcification versus stent overlying the left heart border. No acute osseous or soft tissue abnormality. Degenerative changes are present in the imaged spine and shoulders. IMPRESSION: Worsening interstitial and airspace disease in the right hemithorax with associated volume loss and likely right pleural effusion. Additional scarring and opacity in the left lung base is present as well likely with trace left effusion. Features could reflect persistent infection versus recrudescence or superinfection. Aortic Atherosclerosis (ICD10-I70.0). Mild prominence of the cardiac silhouette, possibly related to portable technique. Electronically Signed   By: Lovena Le M.D.   On: 07/03/2019 01:51   DG Chest Port 1 View  Result Date: 06/10/2019 CLINICAL DATA:  Fever.  COVID-19 pneumonia. EXAM: PORTABLE CHEST 1 VIEW COMPARISON:  June 07, 2019 FINDINGS: Been significant interval development diffuse airspace opacities throughout the right lung field with focal areas of consolidation involving the right lower lobe. There is an airspace opacity at the left lung base. There is no pneumothorax. No large pleural effusion. Aortic calcifications are noted. The heart size is normal. IMPRESSION: Significant interval development of bilateral airspace opacities,  greatest on the right, concerning for multifocal pneumonia (viral or bacterial). Electronically Signed   By: Constance Holster M.D.   On: 06/10/2019 21:19

## 2019-07-04 NOTE — Progress Notes (Signed)
   07/04/19 0800  SLP Visit Information  SLP Received On 07/04/19  General Information  HPI Jason Malone a 78 y.o.malewith medical history significant ofBPH withLUTS, hypertension, hyperlipidemia, metastatic lung cancer, paroxysmal A. fib currently not on anticoagulation, diet-controlled type 2 diabetes, recent hospital admission in January 2021 for COVID-19 viral pneumonia presenting with complaints oflower urinary tract symptoms. His main complaint was inability to pass urine and making less urine, his work-up suggested some pneumonia and urinary retention  Type of Study Bedside Swallow Evaluation  Diet Prior to this Study Regular;Thin liquids  Temperature Spikes Noted No  Respiratory Status Room air  History of Recent Intubation No  Behavior/Cognition Alert;Cooperative;Pleasant mood  Oral Cavity Assessment WFL  Oral Care Completed by SLP No  Oral Cavity - Dentition Edentulous  Self-Feeding Abilities Able to feed self  Patient Positioning Upright in chair  Baseline Vocal Quality Normal  Thin Liquid  Thin Liquid WFL  Nectar Thick Liquid  Nectar Thick Liquid NT  Honey Thick Liquid  Honey Thick Liquid NT  Puree  Puree NT  Solid  Solid [facility]  SLP Assessment  Clinical Impression Statement (ACUTE ONLY) Pt demosntrates no difficulty swallowing; no complaints and no signs of aspiration observed. RN confirms. Pt to continue current diet and SLP will sign off.   Impact on safety and function Mild aspiration risk  Swallow Evaluation Recommendations  SLP Diet Recommendations Regular;Thin liquid  Treatment Plan  Treatment Recommendations No treatment recommended at this time  Follow up Recommendations None  Individuals Consulted  Consulted and Agree with Results and Recommendations Patient  SLP Time Calculation  SLP Start Time (ACUTE ONLY) 1134  SLP Stop Time (ACUTE ONLY) 1144  SLP Time Calculation (min) (ACUTE ONLY) 10 min  SLP Evaluations  $ SLP Speech Visit 1 Visit  SLP  Evaluations  $BSS Swallow 1 Procedure

## 2019-07-04 NOTE — Progress Notes (Signed)
   07/04/19 1322  OT Visit Information  Last OT Received On 07/04/19  Reason Eval/Treat Not Completed Patient at procedure or test/ unavailable;Other (comment) (with PT)   Plan to reattempt at a later time.  Tyrone Schimke, OT Acute Rehabilitation Services Pager: [phone] Office: [phone]

## 2019-07-05 LAB — CBC WITH DIFFERENTIAL/PLATELET
Abs Immature Granulocytes: 0.26 10*3/uL — ABNORMAL HIGH (ref 0.00–0.07)
Basophils Absolute: 0 10*3/uL (ref 0.0–0.1)
Basophils Relative: 0 %
Eosinophils Absolute: 0 10*3/uL (ref 0.0–0.5)
Eosinophils Relative: 0 %
HCT: 37 % — ABNORMAL LOW (ref 39.0–52.0)
Hemoglobin: 12.2 g/dL — ABNORMAL LOW (ref 13.0–17.0)
Immature Granulocytes: 1 %
Lymphocytes Relative: 2 %
Lymphs Abs: 0.3 10*3/uL — ABNORMAL LOW (ref 0.7–4.0)
MCH: 30.4 pg (ref 26.0–34.0)
MCHC: 33 g/dL (ref 30.0–36.0)
MCV: 92.3 fL (ref 80.0–100.0)
Monocytes Absolute: 0.6 10*3/uL (ref 0.1–1.0)
Monocytes Relative: 3 %
Neutro Abs: 19.3 10*3/uL — ABNORMAL HIGH (ref 1.7–7.7)
Neutrophils Relative %: 94 %
Platelets: 137 10*3/uL — ABNORMAL LOW (ref 150–400)
RBC: 4.01 MIL/uL — ABNORMAL LOW (ref 4.22–5.81)
RDW: 15.1 % (ref 11.5–15.5)
WBC: 20.5 10*3/uL — ABNORMAL HIGH (ref 4.0–10.5)
nRBC: 0 % (ref 0.0–0.2)

## 2019-07-05 LAB — GLUCOSE, CAPILLARY
Glucose-Capillary: 169 mg/dL — ABNORMAL HIGH (ref 70–99)
Glucose-Capillary: 225 mg/dL — ABNORMAL HIGH (ref 70–99)
Glucose-Capillary: 169 mg/dL — ABNORMAL HIGH (ref 70–99)
Glucose-Capillary: 238 mg/dL — ABNORMAL HIGH (ref 70–99)

## 2019-07-05 LAB — COMPREHENSIVE METABOLIC PANEL
ALT: 28 U/L (ref 0–44)
AST: 24 U/L (ref 15–41)
Albumin: 2 g/dL — ABNORMAL LOW (ref 3.5–5.0)
Alkaline Phosphatase: 127 U/L — ABNORMAL HIGH (ref 38–126)
Anion gap: 9 (ref 5–15)
BUN: 19 mg/dL (ref 8–23)
CO2: 25 mmol/L (ref 22–32)
Calcium: 8.5 mg/dL — ABNORMAL LOW (ref 8.9–10.3)
Chloride: 103 mmol/L (ref 98–111)
Creatinine, Ser: 0.71 mg/dL (ref 0.61–1.24)
GFR calc Af Amer: >60 mL/min (ref >60)
GFR calc non Af Amer: >60 mL/min (ref >60)
Glucose, Bld: 249 mg/dL — ABNORMAL HIGH (ref 70–99)
Potassium: 4 mmol/L (ref 3.5–5.1)
Sodium: 137 mmol/L (ref 135–145)
Total Bilirubin: 0.6 mg/dL (ref 0.3–1.2)
Total Protein: 5.5 g/dL — ABNORMAL LOW (ref 6.5–8.1)

## 2019-07-05 LAB — URINALYSIS, ROUTINE W REFLEX MICROSCOPIC
Bilirubin Urine: NEGATIVE
Glucose, UA: NEGATIVE mg/dL
Hgb urine dipstick: NEGATIVE
Ketones, ur: NEGATIVE mg/dL
Leukocytes,Ua: NEGATIVE
Nitrite: NEGATIVE
Protein, ur: NEGATIVE mg/dL
Specific Gravity, Urine: 1.01 (ref 1.005–1.030)
pH: 5 (ref 5.0–8.0)

## 2019-07-05 LAB — HEMOGLOBIN A1C
Hgb A1c MFr Bld: 6.9 % — ABNORMAL HIGH (ref 4.8–5.6)
Mean Plasma Glucose: 151.33 mg/dL

## 2019-07-05 LAB — D-DIMER, QUANTITATIVE: D-Dimer, Quant: 2.69 ug/mL-FEU — ABNORMAL HIGH (ref 0.00–0.50)

## 2019-07-05 LAB — C-REACTIVE PROTEIN: CRP: 10.2 mg/dL — ABNORMAL HIGH (ref <1.0)

## 2019-07-05 LAB — PROCALCITONIN: Procalcitonin: 0.1 ng/mL

## 2019-07-05 LAB — BRAIN NATRIURETIC PEPTIDE: B Natriuretic Peptide: 197.2 pg/mL — ABNORMAL HIGH (ref 0.0–100.0)

## 2019-07-05 LAB — MAGNESIUM: Magnesium: 2 mg/dL (ref 1.7–2.4)

## 2019-07-05 MED ORDER — PHENAZOPYRIDINE HCL 200 MG PO TABS
200.0000 mg | ORAL_TABLET | Freq: Three times a day (TID) | ORAL | Status: DC
  Administered 2019-07-05 – 2019-07-06 (×5): 200 mg via ORAL
  Filled 2019-07-05 (×6): qty 1

## 2019-07-05 MED ORDER — METHYLPREDNISOLONE SODIUM SUCC 40 MG IJ SOLR
40.0000 mg | Freq: Two times a day (BID) | INTRAMUSCULAR | Status: DC
  Administered 2019-07-06: 40 mg via INTRAVENOUS
  Filled 2019-07-05: qty 1

## 2019-07-05 NOTE — Progress Notes (Signed)
PROGRESS NOTE                                                                                                                                                                                                             Patient Demographics:    Jason Malone, is a 79 y.o. male, DOB - 06/09/1940, IRW:[id number]  Outpatient Primary MD for the patient is Delsa Grana, PA-C    LOS - 2  Admit date - 07/03/2019    Chief Complaint  Patient presents with  . Urinary Frequency  . Shortness of Breath       Brief Narrative    Jason Malone is a 79 y.o. male with medical history significant of BPH with LUTS, hypertension, hyperlipidemia, metastatic lung cancer, paroxysmal A. fib currently not on anticoagulation, diet-controlled type 2 diabetes, recent hospital admission in January 2021 for COVID-19 viral pneumonia presenting with complaints of lower urinary tract symptoms.  His main complaint was inability to pass urine and making less urine, his work-up suggested some pneumonia and urinary retention, he was admitted to the hospital for further care.   Subjective:    Jason Malone today still reports incontinence, and difficulty urinating, reports some cough, dyspnea has improved .   Assessment  & Plan :     History of BPH with urinary retention.  -Patient with evidence of urinary retention, had 1000 cc post void on bladder scan, required Foley catheter insertion . -We will increase his Flomax dose . -DC his Foley catheter 2/24, patient faculty to urinate, but overall no high residual volume, averaging 2-300, but reports multiple episodes, for now continue with IV antibiotics, continue with increased dose of Flomax to see if it helps, but have high clinical suspicion may need his Foley to be reinserted, for now continue to monitor bladder scan every 8 hours. -Reports he used to follow with Dr. Jasmine December from [facility] urology -We will  attempt voiding trial today for ambulation and activity , patient reports history of previous incision where he required to be discharged home with Foley catheter after prolonged hospital stay . -Continue with IV antibiotics for Proteus in the urine .   COPD for patient/pneumonia with history of recent fully treated COVID-19 pneumonia  - do not think he has any signs of active COVID-19 infection, he is beyond 21 days of quarantine. -  Patient with significant wheezing today, will start on IV steroids, continue with Rocephin and azithromycin . - Encouraged the patient to sit up in chair in the daytime use I-S and flutter valve for pulmonary toiletry and then prone in bed when at night.    SpO2: 93 % O2 Flow Rate (L/min): 4 L/min  Recent Labs  Lab 07/03/19 0705 07/04/19 0320 07/05/19 0314  CRP  --  12.5* 10.2*  DDIMER 2.68* 2.59* 2.69*  FERRITIN 453*  --   --   BNP  --  168.2* 197.2*  PROCALCITON 0.11 <0.10 0.10    Hepatic Function Latest Ref Rng & Units 07/05/2019 07/04/2019 07/03/2019  Total Protein 6.5 - 8.1 g/dL 5.5(L) 5.0(L) 5.8(L)  Albumin 3.5 - 5.0 g/dL 2.0(L) 1.8(L) 2.2(L)  AST 15 - 41 U/L 24 21 32  ALT 0 - 44 U/L 28 27 34  Alk Phosphatase 38 - 126 U/L 127(H) 95 133(H)  Total Bilirubin 0.3 - 1.2 mg/dL 0.6 0.8 0.9  Bilirubin, Direct 0.0 - 0.2 mg/dL - - -      Hyponatremia.   -Improving with IV fluids .  Hyperglycemia -Likely due to steroids, but will check A1c  Hypertension.   -Continue Norvasc.  History of stage IVa neuroendocrine tumor  - recent finding of endobronchial mass found last admission patient is following with Dr. Grayland Ormond as an outpatient, he was referred to pulmonary for biopsy   dyslipidemia.  On statin.  Diet-controlled DM 2 .  Recent A1c 6.8 on 06/14/2019.  ISS.  CBG (last 3)  Recent Labs    07/04/19 2116 07/05/19 0737 07/05/19 1117  GLUCAP 112* 169* 225*     Condition - Fair  Family Communication  : none at bedside  Code Status :   Full  Diet :   Diet Order            Diet Carb Modified Fluid consistency: Thin; Room service appropriate? Yes  Diet effective now               Disposition Plan  : In the hospital getting treatment for pneumonia with IV antibiotics  Consults  :  None  Procedures  :    CT Abdomen and pelvis -  basilar infiltrate, enlarged prostate.  PUD Prophylaxis :  None  DVT Prophylaxis  :  Lovenox   Lab Results  Component Value Date   PLT 137 (L) 07/05/2019    Inpatient Medications  Scheduled Meds: . amLODipine  2.5 mg Oral Daily  . vitamin C  500 mg Oral Daily  . atorvastatin  40 mg Oral Daily  . azithromycin  500 mg Oral Daily  . Chlorhexidine Gluconate Cloth  6 each Topical Daily  . enoxaparin (LOVENOX) injection  40 mg Subcutaneous Q24H  . finasteride  5 mg Oral Daily  . insulin aspart  0-5 Units Subcutaneous QHS  . insulin aspart  0-9 Units Subcutaneous TID WC  . methylPREDNISolone (SOLU-MEDROL) injection  40 mg Intravenous Q8H  . phenazopyridine  200 mg Oral TID WC  . polycarbophil  625 mg Oral BID  . tamsulosin  0.8 mg Oral QHS  . zinc sulfate  220 mg Oral Daily   Continuous Infusions: . cefTRIAXone (ROCEPHIN)  IV 2 g (07/04/19 2156)   PRN Meds:.acetaminophen, albuterol, chlorpheniramine-HYDROcodone, guaiFENesin-dextromethorphan, phenazopyridine  Antibiotics  :    Anti-infectives (From admission, onward)   Start     Dose/Rate Route Frequency Ordered Stop   07/04/19 2200  cefTRIAXone (ROCEPHIN) 2 g in sodium  chloride 0.9 % 100 mL IVPB     2 g 200 mL/hr over 30 Minutes Intravenous Every 24 hours 07/04/19 1607     07/04/19 1700  azithromycin (ZITHROMAX) tablet 500 mg     500 mg Oral Daily 07/04/19 1607     07/04/19 0500  vancomycin (VANCOREADY) IVPB 1500 mg/300 mL  Status:  Discontinued     1,500 mg 150 mL/hr over 120 Minutes Intravenous Every 24 hours 07/03/19 0654 07/03/19 1029   07/03/19 1100  ceFEPIme (MAXIPIME) 2 g in sodium chloride 0.9 % 100 mL IVPB   Status:  Discontinued     2 g 200 mL/hr over 30 Minutes Intravenous Every 8 hours 07/03/19 0654 07/04/19 1607   07/03/19 0145  vancomycin (VANCOREADY) IVPB 1250 mg/250 mL     1,250 mg 166.7 mL/hr over 90 Minutes Intravenous  Once 07/03/19 0128 07/03/19 0419   07/03/19 0130  ceFEPIme (MAXIPIME) 2 g in sodium chloride 0.9 % 100 mL IVPB     2 g 200 mL/hr over 30 Minutes Intravenous  Once 07/03/19 0124 07/03/19 0229   07/03/19 0130  metroNIDAZOLE (FLAGYL) IVPB 500 mg     500 mg 100 mL/hr over 60 Minutes Intravenous  Once 07/03/19 0124 07/03/19 0337   07/03/19 0130  vancomycin (VANCOCIN) IVPB 1000 mg/200 mL premix  Status:  Discontinued     1,000 mg 200 mL/hr over 60 Minutes Intravenous  Once 07/03/19 0124 07/03/19 0128       Christal Lagerstrom M.D on 07/05/2019 at 2:55 PM  To page go to [email or web address] - password [email or web address]  [facility] -  Office  [phone]    See all Orders from today for further details    Objective:   Vitals:   07/04/19 1742 07/04/19 2145 07/04/19 2258 07/05/19 1013  BP: 129/66  136/67 (!) 142/67  Pulse: 71  69 74  Resp: (!) 23 (!) '21 20 19  '$ Temp: 98 F (36.7 C)  98.2 F (36.8 C) 98.2 F (36.8 C)  TempSrc:   Oral Oral  SpO2: 95%  92% 93%  Weight:      Height:        Wt Readings from Last 3 Encounters:  07/03/19 65.8 kg  06/30/19 65.8 kg  06/25/19 65.8 kg     Intake/Output Summary (Last 24 hours) at 07/05/2019 1455 Last data filed at 07/05/2019 1404 Gross per 24 hour  Intake 1420 ml  Output 2700 ml  Net -1280 ml     Physical Exam  Awake Alert, Oriented X 3, No new F.N deficits, Normal affect Symmetrical Chest wall movement, Good air movement bilaterally, wheezing improved RRR,No Gallops,Rubs or new Murmurs, No Parasternal Heave +ve B.Sounds, Abd Soft, No tenderness, No rebound - guarding or rigidity. No Cyanosis, Clubbing or edema, No new Rash or bruise      Data Review:    CBC Recent Labs  Lab 06/30/19 1530 07/03/19 0139  07/04/19 0320 07/05/19 0314  WBC 23.2* 23.2* 20.8* 20.5*  HGB 13.3 13.0 11.3* 12.2*  HCT 40.0 38.5* 33.4* 37.0*  PLT 181 141* 135* 137*  MCV 91.5 90.8 91.0 92.3  MCH 30.4 30.7 30.8 30.4  MCHC 33.3 33.8 33.8 33.0  RDW 14.8 14.3 15.0 15.1  LYMPHSABS 0.5* 0.4* 0.4* 0.3*  MONOABS 2.1* 2.4* 2.1* 0.6  EOSABS 0.0 0.0 0.0 0.0  BASOSABS 0.1 0.0 0.0 0.0    Chemistries  Recent Labs  Lab 06/30/19 1530 07/03/19 0139 07/04/19 0320 07/05/19 0314  NA 133* 124* 139 137  K 4.3 3.5 3.6 4.0  CL 98 95* 106 103  CO2 25 20* 25 25  GLUCOSE 231* 204* 141* 249*  BUN '13 11 17 19  '$ CREATININE 0.90 0.78 0.78 0.71  CALCIUM 8.5* 8.0* 8.3* 8.5*  MG  --   --  2.1 2.0  AST 26 32 21 24  ALT 35 34 27 28  ALKPHOS 111 133* 95 127*  BILITOT 0.9 0.9 0.8 0.6   ------------------------------------------------------------------------------------------------------------------ No results for input(s): CHOL, HDL, LDLCALC, TRIG, CHOLHDL, LDLDIRECT in the last 72 hours.  Lab Results  Component Value Date   HGBA1C 6.8 (H) 06/14/2019   ------------------------------------------------------------------------------------------------------------------ No results for input(s): TSH, T4TOTAL, T3FREE, THYROIDAB in the last 72 hours.  Invalid input(s): FREET3  Cardiac Enzymes No results for input(s): CKMB, TROPONINI, MYOGLOBIN in the last 168 hours.  Invalid input(s): CK ------------------------------------------------------------------------------------------------------------------    Component Value Date/Time   BNP 197.2 (H) 07/05/2019 0314    Micro Results Recent Results (from the past 240 hour(s))  Urine culture     Status: Abnormal   Collection Time: 07/03/19  1:24 AM   Specimen: In/Out Cath Urine  Result Value Ref Range Status   Specimen Description IN/OUT CATH URINE  Final   Special Requests   Final    NONE Performed at [facility], [address] [place], [place] [address]    Culture  20,000 COLONIES/mL PROTEUS MIRABILIS (A)  Final   Report Status 07/04/2019 FINAL  Final   Organism ID, Bacteria PROTEUS MIRABILIS (A)  Final      Susceptibility   Proteus mirabilis - MIC*    AMPICILLIN <=2 SENSITIVE Sensitive     CEFAZOLIN <=4 SENSITIVE Sensitive     CEFTRIAXONE <=0.25 SENSITIVE Sensitive     CIPROFLOXACIN <=0.25 SENSITIVE Sensitive     GENTAMICIN <=1 SENSITIVE Sensitive     IMIPENEM 4 SENSITIVE Sensitive     NITROFURANTOIN 128 RESISTANT Resistant     TRIMETH/SULFA <=20 SENSITIVE Sensitive     AMPICILLIN/SULBACTAM <=2 SENSITIVE Sensitive     PIP/TAZO <=4 SENSITIVE Sensitive     * 20,000 COLONIES/mL PROTEUS MIRABILIS  Blood Culture (routine x 2)     Status: None (Preliminary result)   Collection Time: 07/03/19  1:29 AM   Specimen: BLOOD  Result Value Ref Range Status   Specimen Description BLOOD RIGHT WRIST  Final   Special Requests   Final    BOTTLES DRAWN AEROBIC AND ANAEROBIC Blood Culture adequate volume   Culture   Final    NO GROWTH 1 DAY Performed at [facility], [address], [place], [place] [address]    Report Status PENDING  Incomplete  Blood Culture (routine x 2)     Status: None (Preliminary result)   Collection Time: 07/03/19  1:39 AM   Specimen: BLOOD  Result Value Ref Range Status   Specimen Description BLOOD LEFT ARM  Final   Special Requests   Final    BOTTLES DRAWN AEROBIC AND ANAEROBIC Blood Culture adequate volume   Culture   Final    NO GROWTH 1 DAY Performed at [facility], [place] [address], [place], [place] [address]    Report Status PENDING  Incomplete    Radiology Reports DG Chest 2 View  Result Date: 06/07/2019 CLINICAL DATA:  Cough EXAM: CHEST - 2 VIEW COMPARISON:  2019 FINDINGS: The heart size and mediastinal contours are within normal limits. Postoperative changes at the left hilum. No new consolidation or edema. Right mid  lung calcified granuloma. Degenerative changes of the spine. IMPRESSION: No acute process  in the chest Electronically Signed   By: Macy Mis M.D.   On: 06/07/2019 11:31   CT ANGIO CHEST PE W OR WO CONTRAST  Result Date: 06/11/2019 CLINICAL DATA:  Shortness of breath.  COVID positive. EXAM: CT ANGIOGRAPHY CHEST WITH CONTRAST TECHNIQUE: Multidetector CT imaging of the chest was performed using the standard protocol during bolus administration of intravenous contrast. Multiplanar CT image reconstructions and MIPs were obtained to evaluate the vascular anatomy. CONTRAST:  52m OMNIPAQUE IOHEXOL 350 MG/ML SOLN COMPARISON:  06/27/2016 FINDINGS: Cardiovascular: Normal heart size. No pericardial effusion. Atherosclerotic calcification of the aorta and coronaries. No pulmonary artery filling defect Mediastinum/Nodes: Negative for adenopathy or mass. Lungs/Pleura: Left upper lobectomy for bronchial carcinoid per chart. There is ground-glass opacity with a subpleural predilection in the right more than left lung consistent with history of COVID 19 positivity. Also, there is a irregular lobulated endobronchial mass at the level of the distal left mainstem bronchus, 2 cm in length. Bronchus appears somewhat thin along the esophageal margin but no clear esophageal fistulization. Upper Abdomen: Cholelithiasis. History of treated liver metastases, reference MRI 03/15/2019. Musculoskeletal: Spondylosis. Rounded sclerotic focus in the T11 vertebral body is stable from 2019. Review of the MIP images confirms the above findings. IMPRESSION: 1. Bilateral ground-glass pneumonia consistent with COVID-19 positivity. 2. 2 cm left main endobronchial mass close to a left upper lobectomy margin, presumably local recurrence of bronchial carcinoid. Electronically Signed   By: JMonte FantasiaM.D.   On: 06/11/2019 04:28   CT ABDOMEN PELVIS W CONTRAST  Result Date: 07/03/2019 CLINICAL DATA:  Dysuria, urinary frequency, history of lung and liver cancer EXAM: CT ABDOMEN AND PELVIS WITH CONTRAST TECHNIQUE: Multidetector CT  imaging of the abdomen and pelvis was performed using the standard protocol following bolus administration of intravenous contrast. CONTRAST:  1063mOMNIPAQUE IOHEXOL 300 MG/ML  SOLN COMPARISON:  06/11/2019, 03/15/2019 FINDINGS: Lower chest: There is progressive consolidation at the right lung base consistent with persistent pneumonia. Trace bilateral pleural effusions have developed in the interim. Hepatobiliary: 2.1 cm mass within the lateral segment left lobe liver, reference image 20, consistent with known metastatic disease. No other discrete liver abnormalities are identified. Calcified gallstones are seen without cholecystitis. Pancreas: Unremarkable. No pancreatic ductal dilatation or surrounding inflammatory changes. Spleen: Normal in size without focal abnormality. Adrenals/Urinary Tract: There is mild diffuse left renal atrophy, stable. Chronic scarring lateral aspect right kidney. Bladder is moderately distended without focal abnormality. The adrenals are normal. Stomach/Bowel: Normal appendix right lower quadrant. No bowel obstruction or ileus. Scattered diverticulosis of the distal colon without diverticulitis. Vascular/Lymphatic: Aortic atherosclerosis. No enlarged abdominal or pelvic lymph nodes. Reproductive: Prostate is enlarged measuring 5.6 x 5.6 cm in transverse dimension. Other: No abdominal wall hernia or abnormality. No abdominopelvic ascites. Musculoskeletal: There are no acute or destructive bony lesions. Punctate sclerotic foci within the lumbar spine and bony pelvis are stable likely reflecting bone islands. Reconstructed images demonstrate no additional findings. IMPRESSION: 1. Progressive right basilar airspace disease consistent with pneumonia. 2. Trace bilateral pleural effusions. 3. 2.1 cm left lobe liver lesion consistent with known metastatic disease. 4. Enlarged prostate. 5. Diverticulosis without diverticulitis. Electronically Signed   By: MiRanda Ngo.D.   On: 07/03/2019  03:46   DG Chest Port 1 View  Result Date: 07/03/2019 CLINICAL DATA:  Urinary frequency EXAM: PORTABLE CHEST 1 VIEW COMPARISON:  CT 06/11/2019, radiograph 06/10/2019 FINDINGS: Worsening consolidative and interstitial opacity  throughout the right hemithorax. Additional reticular changes in the left lung base likely reflecting scarring with associated tenting of the left hemidiaphragm. Suspect at least small right effusion and likely left effusion as well. No pneumothorax. Prominence of the cardiac silhouette may be related to portable technique. Calcified aorta is noted. Coronary calcification versus stent overlying the left heart border. No acute osseous or soft tissue abnormality. Degenerative changes are present in the imaged spine and shoulders. IMPRESSION: Worsening interstitial and airspace disease in the right hemithorax with associated volume loss and likely right pleural effusion. Additional scarring and opacity in the left lung base is present as well likely with trace left effusion. Features could reflect persistent infection versus recrudescence or superinfection. Aortic Atherosclerosis (ICD10-I70.0). Mild prominence of the cardiac silhouette, possibly related to portable technique. Electronically Signed   By: Lovena Le M.D.   On: 07/03/2019 01:51   DG Chest Port 1 View  Result Date: 06/10/2019 CLINICAL DATA:  Fever.  COVID-19 pneumonia. EXAM: PORTABLE CHEST 1 VIEW COMPARISON:  June 07, 2019 FINDINGS: Been significant interval development diffuse airspace opacities throughout the right lung field with focal areas of consolidation involving the right lower lobe. There is an airspace opacity at the left lung base. There is no pneumothorax. No large pleural effusion. Aortic calcifications are noted. The heart size is normal. IMPRESSION: Significant interval development of bilateral airspace opacities, greatest on the right, concerning for multifocal pneumonia (viral or bacterial).  Electronically Signed   By: Constance Holster M.D.   On: 06/10/2019 21:19

## 2019-07-05 NOTE — Evaluation (Signed)
Occupational Therapy Evaluation Patient Details Name: Jason Malone MRN: [id number] DOB: [birth year] Today's Date: 07/05/2019    History of Present Illness Pt is 80 yo male with PMH significant of BPH with LUTS, HTN, hyperlipidemia, metastatic lung cancer, afib, and DM2, recent hospital admission in Jan 2021 for COVID 19 viral PNE.  Pt admitted with pne and urinary retention.   Clinical Impression   Pt with decline in function and safety with ADLs and ADL mobility with impaired strength, balance and endurance. Pt reports that he lives at home with his wife ad prior to his COVID 27 infection January 2021, he was totally  independent, driving and waking daily, was not on O2. Pt currently, fatigues easily, requires min guard A with LB ADLs and sup with mobility with no AD. Pt on 4 L O2, SATs decreased to 82% with minimal activity/exertion, increased O2 to 5 L as pt not recovering to 88% after 2 minutes deep/pursed lip breathing. Pt educated on energy conservation techniques with handout provided. Pt would benefit from acute OT services to address impairments to maximize level of function and safety    Follow Up Recommendations  No OT follow up    Equipment Recommendations  3 in 1 bedside commode;Other (comment);Tub/shower seat(reacher, LH bath sponge)    Recommendations for Other Services       Precautions / Restrictions Precautions Precautions: Fall Precaution Comments: Watch O2 Restrictions Weight Bearing Restrictions: No      Mobility Bed Mobility Overal bed mobility: Needs Assistance Bed Mobility: Supine to Sit     Supine to sit: Supervision        Transfers Overall transfer level: Needs assistance Equipment used: None Transfers: Sit to/from Stand Sit to Stand: Supervision              Balance Overall balance assessment: Needs assistance Sitting-balance support: Feet supported Sitting balance-Leahy Scale: Good     Standing balance support: Single extremity  supported;During functional activity Standing balance-Leahy Scale: Fair                             ADL either performed or assessed with clinical judgement   ADL Overall ADL's : Needs assistance/impaired Eating/Feeding: Independent;Sitting   Grooming: Set up;Supervision/safety;Standing   Upper Body Bathing: Set up;Supervision/ safety;Sitting   Lower Body Bathing: Min guard   Upper Body Dressing : Set up;Supervision/safety;Sitting   Lower Body Dressing: Min guard   Toilet Transfer: Supervision/safety;Ambulation   Toileting- Clothing Manipulation and Hygiene: Supervision/safety;Sit to/from stand       Functional mobility during ADLs: Supervision/safety General ADL Comments: Pt on 4 L O2, SATs decreased to 82% with minimal activity/exertion, increased O2 to 5 L as pt not recovering to 88% after 2 minutes deep/pursed lip  breathing     Vision Baseline Vision/History: Wears glasses Wears Glasses: Reading only Patient Visual Report: No change from baseline       Perception     Praxis      Pertinent Vitals/Pain Pain Assessment: No/denies pain     Hand Dominance Right   Extremity/Trunk Assessment Upper Extremity Assessment Upper Extremity Assessment: Overall WFL for tasks assessed   Lower Extremity Assessment Lower Extremity Assessment: Defer to PT evaluation   Cervical / Trunk Assessment Cervical / Trunk Assessment: Normal   Communication Communication Communication: HOH   Cognition Arousal/Alertness: Awake/alert Behavior During Therapy: WFL for tasks assessed/performed Overall Cognitive Status: Within Functional Limits for tasks assessed  General Comments       Exercises     Shoulder Instructions      Home Living Family/patient expects to be discharged to:: Private residence Living Arrangements: Spouse/significant other Available Help at Discharge: Family;Available 24 hours/day Type of  Home: House Home Access: Stairs to enter [employer] of Steps: 2 Entrance Stairs-Rails: None Home Layout: One level     Bathroom Shower/Tub: Tub/shower unit;Walk-in shower   Bathroom Toilet: Standard     Home Equipment: None          Prior Functioning/Environment Level of Independence: Independent;Needs assistance    ADL's / Homemaking Assistance Needed: Independent prior to COVID, since then having difficulty bathing and dressing self due to endurance.   Comments: Prior to having COVID in January he  walked on treadmill (20 mins twice a day).  Since then just ambulating in house and on O2 and had HHPT.        OT Problem List: Decreased strength;Decreased activity tolerance;Impaired balance (sitting and/or standing);Cardiopulmonary status limiting activity      OT Treatment/Interventions: Self-care/ADL training;Therapeutic exercise;Energy conservation;Therapeutic activities;Balance training;Patient/family education    OT Goals(Current goals can be found in the care plan section) Acute Rehab OT Goals Patient Stated Goal: to get better and go home to spouse OT Goal Formulation: With patient Time For Goal Achievement: 07/19/19 Potential to Achieve Goals: Good ADL Goals Pt Will Perform Grooming: with supervision;with set-up;standing;with caregiver independent in assisting Pt Will Perform Upper Body Bathing: with set-up;with modified independence;sitting;with caregiver independent in assisting Pt Will Perform Lower Body Bathing: with supervision;with set-up;sit to/from stand;with adaptive equipment;with caregiver independent in assisting Pt Will Perform Upper Body Dressing: with set-up;with modified independence;sitting;with caregiver independent in assisting Pt Will Perform Lower Body Dressing: with supervision;with set-up;sit to/from stand;with caregiver independent in assisting Pt Will Transfer to Toilet: with modified independence;ambulating Pt Will Perform  Toileting - Clothing Manipulation and hygiene: with supervision Pt Will Perform Tub/Shower Transfer: with supervision;with modified independence;ambulating;3 in 1;shower seat Additional ADL Goal #1: Pt will verbalize and demo 3 energy conservation techniques for ADLs and ADL mobility  OT Frequency: Min 2X/week   Barriers to D/C:            Co-evaluation              AM-PAC OT "6 Clicks" Daily Activity     Outcome Measure Help from another person eating meals?: None Help from another person taking care of personal grooming?: A Little Help from another person toileting, which includes using toliet, bedpan, or urinal?: A Little Help from another person bathing (including washing, rinsing, drying)?: A Little Help from another person to put on and taking off regular upper body clothing?: A Little Help from another person to put on and taking off regular lower body clothing?: A Little 6 Click Score: 19   End of Session Equipment Utilized During Treatment: Oxygen  Activity Tolerance: Patient tolerated treatment well(some SOB, able to speak unlabored) Patient left: in chair;with call bell/phone within reach  OT Visit Diagnosis: Unsteadiness on feet (R26.81);Muscle weakness (generalized) (M62.81)                Time: 1950-9326 OT Time Calculation (min): 27 min Charges:  OT General Charges $OT Visit: 1 Visit OT Evaluation $OT Eval Moderate Complexity: 1 Mod OT Treatments $Therapeutic Activity: 8-22 mins    Britt Bottom 07/05/2019, 12:37 PM

## 2019-07-05 NOTE — Progress Notes (Addendum)
Pt was complaining of urinating every 10-15 minutes. Patient put out a total of 175 mL since foley was d/c today at 4:30 pm. When bladder scanned, there was approximately 309 mL present. Water was given and a heat pack was applied to stimulate a urine flow. The physician was notified. Will continue to monitor to patient to make sure he does not retain more fluid.

## 2019-07-06 DIAGNOSIS — R3911 Hesitancy of micturition: Secondary | ICD-10-CM

## 2019-07-06 DIAGNOSIS — E871 Hypo-osmolality and hyponatremia: Secondary | ICD-10-CM

## 2019-07-06 LAB — CBC WITH DIFFERENTIAL/PLATELET
Abs Immature Granulocytes: 0.23 10*3/uL — ABNORMAL HIGH (ref 0.00–0.07)
Basophils Absolute: 0 10*3/uL (ref 0.0–0.1)
Basophils Relative: 0 %
Eosinophils Absolute: 0 10*3/uL (ref 0.0–0.5)
Eosinophils Relative: 0 %
HCT: 35.7 % — ABNORMAL LOW (ref 39.0–52.0)
Hemoglobin: 11.6 g/dL — ABNORMAL LOW (ref 13.0–17.0)
Immature Granulocytes: 1 %
Lymphocytes Relative: 2 %
Lymphs Abs: 0.5 10*3/uL — ABNORMAL LOW (ref 0.7–4.0)
MCH: 30 pg (ref 26.0–34.0)
MCHC: 32.5 g/dL (ref 30.0–36.0)
MCV: 92.2 fL (ref 80.0–100.0)
Monocytes Absolute: 1.4 10*3/uL — ABNORMAL HIGH (ref 0.1–1.0)
Monocytes Relative: 7 %
Neutro Abs: 19.1 10*3/uL — ABNORMAL HIGH (ref 1.7–7.7)
Neutrophils Relative %: 90 %
Platelets: 144 10*3/uL — ABNORMAL LOW (ref 150–400)
RBC: 3.87 MIL/uL — ABNORMAL LOW (ref 4.22–5.81)
RDW: 15 % (ref 11.5–15.5)
WBC: 21.3 10*3/uL — ABNORMAL HIGH (ref 4.0–10.5)
nRBC: 0.1 % (ref 0.0–0.2)

## 2019-07-06 LAB — GLUCOSE, CAPILLARY
Glucose-Capillary: 117 mg/dL — ABNORMAL HIGH (ref 70–99)
Glucose-Capillary: 178 mg/dL — ABNORMAL HIGH (ref 70–99)

## 2019-07-06 LAB — COMPREHENSIVE METABOLIC PANEL
ALT: 32 U/L (ref 0–44)
AST: 24 U/L (ref 15–41)
Albumin: 1.9 g/dL — ABNORMAL LOW (ref 3.5–5.0)
Alkaline Phosphatase: 118 U/L (ref 38–126)
Anion gap: 10 (ref 5–15)
BUN: 19 mg/dL (ref 8–23)
CO2: 27 mmol/L (ref 22–32)
Calcium: 8.5 mg/dL — ABNORMAL LOW (ref 8.9–10.3)
Chloride: 104 mmol/L (ref 98–111)
Creatinine, Ser: 0.91 mg/dL (ref 0.61–1.24)
GFR calc Af Amer: >60 mL/min (ref >60)
GFR calc non Af Amer: >60 mL/min (ref >60)
Glucose, Bld: 147 mg/dL — ABNORMAL HIGH (ref 70–99)
Potassium: 3.9 mmol/L (ref 3.5–5.1)
Sodium: 141 mmol/L (ref 135–145)
Total Bilirubin: 0.7 mg/dL (ref 0.3–1.2)
Total Protein: 5.1 g/dL — ABNORMAL LOW (ref 6.5–8.1)

## 2019-07-06 LAB — D-DIMER, QUANTITATIVE: D-Dimer, Quant: 2.85 ug/mL-FEU — ABNORMAL HIGH (ref 0.00–0.50)

## 2019-07-06 LAB — BRAIN NATRIURETIC PEPTIDE: B Natriuretic Peptide: 158.7 pg/mL — ABNORMAL HIGH (ref 0.0–100.0)

## 2019-07-06 LAB — MAGNESIUM: Magnesium: 2 mg/dL (ref 1.7–2.4)

## 2019-07-06 LAB — C-REACTIVE PROTEIN: CRP: 5.5 mg/dL — ABNORMAL HIGH (ref <1.0)

## 2019-07-06 MED ORDER — PREDNISONE 10 MG PO TABS: ORAL_TABLET | ORAL | 0 refills | Status: DC

## 2019-07-06 MED ORDER — TAMSULOSIN HCL 0.4 MG PO CAPS: 0.8000 mg | ORAL_CAPSULE | Freq: Every day | ORAL | 0 refills | Status: DC

## 2019-07-06 MED ORDER — CEPHALEXIN 500 MG PO CAPS: 500.0000 mg | ORAL_CAPSULE | Freq: Three times a day (TID) | ORAL | 0 refills | Status: AC

## 2019-07-06 NOTE — Discharge Instructions (Signed)
Follow with Primary MD Delsa Grana, PA-C in 7 days   Get CBC, CMP, 2 view  checked  by Primary MD next visit.    Activity: As tolerated with Full fall precautions use walker/cane & assistance as needed   Disposition Home    Diet: Carb Modified  , with feeding assistance and aspiration precautions.  For Heart failure patients - Check your Weight same time everyday, if you gain over 2 pounds, or you develop in leg swelling, experience more shortness of breath or chest pain, call your Primary MD immediately. Follow Cardiac Low Salt Diet and 1.5 lit/day fluid restriction.   On your next visit with your primary care physician please Get Medicines reviewed and adjusted.   Please request your Prim.MD to go over all Hospital Tests and Procedure/Radiological results at the follow up, please get all Hospital records sent to your Prim MD by signing hospital release before you go home.   If you experience worsening of your admission symptoms, develop shortness of breath, life threatening emergency, suicidal or homicidal thoughts you must seek medical attention immediately by calling 911 or calling your MD immediately  if symptoms less severe.  You Must read complete instructions/literature along with all the possible adverse reactions/side effects for all the Medicines you take and that have been prescribed to you. Take any new Medicines after you have completely understood and accpet all the possible adverse reactions/side effects.   Do not drive, operating heavy machinery, perform activities at heights, swimming or participation in water activities or provide baby sitting services if your were admitted for syncope or siezures until you have seen by Primary MD or a Neurologist and advised to do so again.  Do not drive when taking Pain medications.    Do not take more than prescribed Pain, Sleep and Anxiety Medications  Special Instructions: If you have smoked or chewed Tobacco  in the last 2  yrs please stop smoking, stop any regular Alcohol  and or any Recreational drug use.  Wear Seat belts while driving.   Please note  You were cared for by a hospitalist during your hospital stay. If you have any questions about your discharge medications or the care you received while you were in the hospital after you are discharged, you can call the unit and asked to speak with the hospitalist on call if the hospitalist that took care of you is not available. Once you are discharged, your primary care physician will handle any further medical issues. Please note that NO REFILLS for any discharge medications will be authorized once you are discharged, as it is imperative that you return to your primary care physician (or establish a relationship with a primary care physician if you do not have one) for your aftercare needs so that they can reassess your need for medications and monitor your lab values.

## 2019-07-06 NOTE — Discharge Summary (Addendum)
Jason Malone, is a 79 y.o. male  DOB [birth year]  MRN [id number].  Admission date:  07/03/2019  Admitting Physician  Thurnell Lose, MD  Discharge Date:  07/06/2019   Primary MD  Delsa Grana, PA-C  Recommendations for primary care physician for things to follow:  -Please check CBC, CMP during next visit. -Will need to follow with urology as an outpatient for a voiding trial, appointment has been arranged. -Patient will need to keep up his appointment with pulmonary service for evaluation of new lung mass evaluation.   Admission Diagnosis  Acute respiratory failure with hypoxia (HCC) [J96.01] PNA (pneumonia) [J18.9] Pneumonia due to COVID-19 virus [U07.1, J12.82]   Discharge Diagnosis  Acute respiratory failure with hypoxia (HCC) [J96.01] PNA (pneumonia) [J18.9] Pneumonia due to COVID-19 virus [U07.1, J12.82]    Principal Problem:   Pneumonia due to COVID-19 virus Active Problems:   Respiratory failure (HCC)   Hyponatremia   Metabolic acidosis   BPH (benign prostatic hyperplasia)   PNA (pneumonia)      Past Medical History:  Diagnosis Date  . Allergic rhinitis   . Benign prostatic hypertrophy with lower urinary tract symptoms (LUTS)   . Chest pain    a. 05/2013 MV: EF 70%, no ischemia.  . Diverticulitis    DIVERTICULOSIS  . Enlarged prostate   . Essential hypertension    CONTROLLED ON MEDS  . Full dentures    upper and lower  . H/O hypokalemia   . Hx of atrial fibrillation without current medication    CARDIOLOGIST-DR [facility]  . 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 Hyperlipemia   . Hypomagnesemia   . IFG (impaired fasting glucose)   . Liver cancer ([place])   . Liver cancer ([place]) 12/08/2016  . Liver mass, left lobe 08/28/2016   Probably hemangioma; will get MRI of liver, refer to GI  . Lung cancer ([place])    a. carcinoid, left lung, Stage 1b (T2a,  N0, cM0);  b. 05/2013 s/p VATS & LULobectomy.  . Monocytosis   . Osteopenia   . Paroxysmal atrial flutter (HCC)    a. 03/2013->no recurrence;  b. CHA2DS2VASc = 2-->not currently on anticoagulation;  c. 05/2012 Echo: EF 55-60%, normal RV.  [name] Wears glasses     Past Surgical History:  Procedure Laterality Date  . BRONCHOSCOPY     04/06/2013  . CARDIOVASCULAR STRESS TEST  05/2013   a. No evidence of ischemia or infarct, EF 70%, no WMAs  . CATARACT EXTRACTION W/PHACO Left 07/14/2015   Procedure: CATARACT EXTRACTION PHACO AND INTRAOCULAR LENS PLACEMENT (IOC);  Surgeon: [name], MD;  Location: [place];  Service: Ophthalmology;  Laterality: Left;  . CATARACT EXTRACTION W/PHACO Right 10/01/2015   Procedure: CATARACT EXTRACTION PHACO AND INTRAOCULAR LENS PLACEMENT (IOC);  Surgeon: [name], MD;  Location: [place];  Service: Ophthalmology;  Laterality: Right;  . COLONOSCOPY W/ POLYPECTOMY     bleed after-had to go to surgery to stop bleeding via colonoscopy  .  COLONOSCOPY WITH PROPOFOL N/A 11/19/2015   Procedure: COLONOSCOPY WITH PROPOFOL;  Surgeon: [name], MD;  Location: [facility] ENDOSCOPY;  Service: Endoscopy;  Laterality: N/A;  . DUPUYTREN CONTRACTURE RELEASE  12/15/2011   Procedure: DUPUYTREN CONTRACTURE RELEASE;  Surgeon: [name], MD;  Location: [place];  Service: Orthopedics;  Laterality: Left;  Fasciotomy left ring finger dupuytrens  . FASCIECTOMY Right 11/07/2018   Procedure: SEGMENTAL FASCIECTOMY RIGHT RING FINGER;  Surgeon: Daryll Brod, MD;  Location: [place];  Service: Orthopedics;  Laterality: Right;  AXILLARY BLOCK  . IR ANGIOGRAM SELECTIVE EACH ADDITIONAL VESSEL  07/26/2017  . IR ANGIOGRAM SELECTIVE EACH ADDITIONAL VESSEL  07/26/2017  . IR ANGIOGRAM SELECTIVE EACH ADDITIONAL VESSEL  07/26/2017  . IR ANGIOGRAM SELECTIVE EACH ADDITIONAL VESSEL  07/26/2017  . IR ANGIOGRAM SELECTIVE EACH ADDITIONAL VESSEL   07/26/2017  . IR ANGIOGRAM SELECTIVE EACH ADDITIONAL VESSEL  08/11/2017  . IR ANGIOGRAM SELECTIVE EACH ADDITIONAL VESSEL  04/28/2018  . IR ANGIOGRAM VISCERAL SELECTIVE  07/26/2017  . IR ANGIOGRAM VISCERAL SELECTIVE  08/11/2017  . IR ANGIOGRAM VISCERAL SELECTIVE  04/28/2018  . IR EMBO ARTERIAL NOT HEMORR HEMANG INC GUIDE ROADMAPPING  07/26/2017  . IR EMBO TUMOR ORGAN ISCHEMIA INFARCT INC GUIDE ROADMAPPING  08/11/2017  . IR EMBO TUMOR ORGAN ISCHEMIA INFARCT INC GUIDE ROADMAPPING  04/28/2018  . IR RADIOLOGIST EVAL & MGMT  07/07/2017  . IR RADIOLOGIST EVAL & MGMT  09/07/2017  . IR RADIOLOGIST EVAL & MGMT  12/21/2017  . IR RADIOLOGIST EVAL & MGMT  03/22/2018  . IR RADIOLOGIST EVAL & MGMT  06/08/2018  . IR RADIOLOGIST EVAL & MGMT  11/01/2018  . IR RADIOLOGIST EVAL & MGMT  03/21/2019  . IR US GUIDE VASC ACCESS RIGHT  07/26/2017  . IR US GUIDE VASC ACCESS RIGHT  08/11/2017  . IR US GUIDE VASC ACCESS RIGHT  04/28/2018  . LUNG LOBECTOMY  06/10/13   upper left lung  . MULTIPLE TOOTH EXTRACTIONS    . REMOVAL RETAINED LENS Right 11/17/2015   Procedure: REMOVAL RETAINED LENS FROAGMENTS RIGHT EYE;  Surgeon: [name], MD;  Location: [place];  Service: Ophthalmology;  Laterality: Right;  . TONSILLECTOMY    . UPPER GASTROINTESTINAL ENDOSCOPY  11-03-15   Dr [name]  . VASECTOMY    . VIDEO ASSISTED THORACOSCOPY (VATS)/WEDGE RESECTION Left 06/04/2013   Procedure: VIDEO ASSISTED THORACOSCOPY (VATS)/WEDGE RESECTION;  Surgeon: [name], MD;  Location: [place];  Service: Thoracic;  Laterality: Left;  [name] VIDEO BRONCHOSCOPY N/A 06/04/2013   Procedure: VIDEO BRONCHOSCOPY;  Surgeon: [name], MD;  Location[facility] OR;  Service: Thoracic;  Laterality: N/A;       History of present illness and  Hospital Course:     Kindly see H&P for history of present illness and admission details, please review complete Labs, Consult reports and Test reports for all details in brief  HPI  from the history and  physical done on the day of admission 07/03/2019  HPI: Jason Malone is a 79 y.o. male with medical history significant of BPH with LUTS, hypertension, hyperlipidemia, metastatic lung cancer, paroxysmal A. fib currently not on anticoagulation, diet-controlled type 2 diabetes, recent hospital admission in January 2021 for COVID-19 viral pneumonia presenting with complaints of lower urinary tract symptoms.  Patient states he has BPH and takes finasteride and Flomax.  He has not seen a urologist in 5 years.  For the past few days he is having urinary frequency, post micturition  dribbling, and significant dysuria.  No fevers or chills.  No nausea, vomiting, or abdominal pain.  States he was admitted for COVID-19 in January and sent home on oxygen.  He continues to use 4 to 5 L of supplemental oxygen in the day as needed and 1 to 2 L at night.  He thinks his breathing is at baseline and denies shortness of breath.  Denies chest pain or cough.  No other complaints.  ED Course: Afebrile.  Tachycardic.  Oxygen saturation 84% on 4 L supplemental oxygen on arrival, improved to low 90s with 6 L supplemental oxygen.  Tachypneic and speaking in short sentences.  Labs showing leukocytosis with WBC count 23.2, persistent compared to labs done 2 weeks ago.  Lactic acid level normal.  Corrected sodium 126, previously normal.  Bicarb 20, anion gap 9.  Blood glucose 204.  BUN 11, creatinine 0.7.  UA not suggestive of infection.  Urine culture pending.  Blood culture x2 pending. Chest x-ray showing worsening interstitial and airspace disease in the right hemithorax with associated volume loss and likely right pleural effusion.  In addition, there is scarring and opacity in the left lung base likely with trace left effusion.  Features suspicious for persistent infection versus recrudescence or superinfection. CT abdomen pelvis showing a 2.1 cm left lobe liver lesion consistent with known metastatic disease.  Enlarged prostate.   Diverticulosis without diverticulitis.  Progressive right basilar airspace disease consistent with pneumonia.  Trace bilateral pleural effusions. Patient received broad-spectrum antibiotics including vancomycin, cefepime, and metronidazole.    Hospital Course   Asser W Crossis a 78 y.o.malewith medical history significant ofBPH withLUTS, hypertension, hyperlipidemia, metastatic lung cancer, paroxysmal A. fib currently not on anticoagulation, diet-controlled type 2 diabetes, recent hospital admission in January 2021 for COVID-19 viral pneumonia presenting with complaints oflower urinary tract symptoms.  His main complaint was inability to pass urine and making less urine, his work-up suggested some pneumonia and urinary retention, he was admitted to the hospital for further care.    History of BPH with urinary retention.  -Patient with evidence of urinary retention, had 1000 cc post void on bladder scan, required Foley catheter insertion .  Patient had voiding trial on 2/24, unfortunately was unsuccessful, he required Foley catheter reinsertion, patient report urinary retention in the past, used to follow with Dr. Jasmine December from [facility] urology, as well his UA showing Proteus bacteriuria, discussed with urology Dr. Junious Silk, will need Foley catheter to remain on discharge, and they will arrange for outpatient follow-up for voiding trial, for now we will continue with increased dose Flomax 0.8 mg p.o. nightly, continue to treat his Proteus UTI, received Rocephin during hospital stay, will be discharged on another 5 days of oral Keflex.   COPD for patient/pneumonia with history of recent fully treated COVID-19 pneumonia  - do not think he has any signs of active COVID-19 infection, he is beyond 21 days of quarantine. -Patient with significant wheezing , he was treated with steroids, as well he was treated with rocephin  and azithromycin given cough with productive sputum . -He will be  discharged on prednisone taper. -Patient remains with leukocytosis, but this is most likely due to steroids as he is nontoxic appearing, with normal procalcitonin   Hyponatremia.   -resolved  Diabetes mellitus, controlled -Patient with elevated CBG requiring sliding scale during hospital stay, A1c was 6.9, for now continue with carb modified diet, follow with PCP as an outpatient  Hypertension.   -Continue with home medications  History of stage IVa neuroendocrine tumor  - recent finding of endobronchial mass found last admission patient is following with Dr. Grayland Ormond as an outpatient, he was referred to pulmonary for biopsy, reports he has his follow-up appointment with pulmonary in March.   dyslipidemia.  On statin.    Discharge Condition:    Follow UP  Follow-up Information    Delsa Grana, PA-C Follow up in 1 week(s).   Specialty: Family Medicine Contact information: [address] [address] [place] Follow up.   Contact information: [place] [phone]            Discharge Instructions  and  Discharge Medications     Discharge Instructions    Diet - low sodium heart healthy   Complete by: As directed    Increase activity slowly   Complete by: As directed      Allergies as of 07/06/2019      Reactions   Latex Itching   Tape only- REACTED TO BANDAGE ON ABDOMEN   Tape Itching   Surgical tapes      Medication List    TAKE these medications   amLODipine 2.5 MG tablet Commonly known as: NORVASC Take 1 tablet by mouth once daily   atorvastatin 40 MG tablet Commonly known as: LIPITOR TAKE 1 TABLET BY MOUTH ONCE DAILY FOR CHOLESTEROL What changed:   how much to take  how to take this  when to take this  additional instructions   Calcium 600/Vitamin D 600-400 MG-UNIT Tabs Generic drug: Calcium Carbonate-Vitamin D3 Take 1  tablet by mouth daily.   cephALEXin 500 MG capsule Commonly known as: KEFLEX Take 1 capsule (500 mg total) by mouth 3 (three) times daily for 5 days.   cholecalciferol 25 MCG (1000 UNIT) tablet Commonly known as: VITAMIN D3 Take 1,000 Units by mouth daily.   Cinnamon 500 MG capsule Take 500 mg by mouth at bedtime. BEDTIME   finasteride 5 MG tablet Commonly known as: PROSCAR Take 1 tablet (5 mg total) by mouth daily.   guaiFENesin-dextromethorphan 100-10 MG/5ML syrup Commonly known as: ROBITUSSIN DM Take 10 mLs by mouth every 4 (four) hours as needed for cough.   Ipratropium-Albuterol 20-100 MCG/ACT Aers respimat Commonly known as: COMBIVENT Inhale 1 puff into the lungs every 6 (six) hours.   LANREOTIDE ACETATE [place] Inject 1 Dose into the skin every 30 (thirty) days.   losartan 100 MG tablet Commonly known as: COZAAR Take 1 tablet (100 mg total) by mouth daily.   multivitamin with minerals tablet Take 1 tablet by mouth daily. AM   phenazopyridine 100 MG tablet Commonly known as: PYRIDIUM Take 1 tablet (100 mg total) by mouth 2 (two) times daily as needed for pain.   polycarbophil 625 MG tablet Commonly known as: FIBERCON Take 625 mg by mouth 2 (two) times daily. AM   predniSONE 10 MG tablet Commonly known as: DELTASONE Take 30 mg for 1 day, then  20 mg for 2 days, then 10 mg for 2 day, then stop   tamsulosin 0.4 MG Caps capsule Commonly known as: FLOMAX Take 2 capsules (0.8 mg total) by mouth at bedtime. What changed:   how much to take  when to take this   vitamin C 500 MG tablet Commonly known as: ASCORBIC ACID Take 500 mg by mouth daily. AM   zinc sulfate 220 (50 Zn) MG capsule  Take 1 capsule (220 mg total) by mouth daily.         Diet and Activity recommendation: See Discharge Instructions above   Consults obtained -  None   Major procedures and Radiology Reports - PLEASE review detailed and final reports for all details, in brief -       DG Chest 2 View  Result Date: 06/07/2019 CLINICAL DATA:  Cough EXAM: CHEST - 2 VIEW COMPARISON:  2019 FINDINGS: The heart size and mediastinal contours are within normal limits. Postoperative changes at the left hilum. No new consolidation or edema. Right mid lung calcified granuloma. Degenerative changes of the spine. IMPRESSION: No acute process in the chest Electronically Signed   By: Macy Mis M.D.   On: 06/07/2019 11:31   CT ANGIO CHEST PE W OR WO CONTRAST  Result Date: 06/11/2019 CLINICAL DATA:  Shortness of breath.  COVID positive. EXAM: CT ANGIOGRAPHY CHEST WITH CONTRAST TECHNIQUE: Multidetector CT imaging of the chest was performed using the standard protocol during bolus administration of intravenous contrast. Multiplanar CT image reconstructions and MIPs were obtained to evaluate the vascular anatomy. CONTRAST:  49mL OMNIPAQUE IOHEXOL 350 MG/ML SOLN COMPARISON:  06/27/2016 FINDINGS: Cardiovascular: Normal heart size. No pericardial effusion. Atherosclerotic calcification of the aorta and coronaries. No pulmonary artery filling defect Mediastinum/Nodes: Negative for adenopathy or mass. Lungs/Pleura: Left upper lobectomy for bronchial carcinoid per chart. There is ground-glass opacity with a subpleural predilection in the right more than left lung consistent with history of COVID 19 positivity. Also, there is a irregular lobulated endobronchial mass at the level of the distal left mainstem bronchus, 2 cm in length. Bronchus appears somewhat thin along the esophageal margin but no clear esophageal fistulization. Upper Abdomen: Cholelithiasis. History of treated liver metastases, reference MRI 03/15/2019. Musculoskeletal: Spondylosis. Rounded sclerotic focus in the T11 vertebral body is stable from 2019. Review of the MIP images confirms the above findings. IMPRESSION: 1. Bilateral ground-glass pneumonia consistent with COVID-19 positivity. 2. 2 cm left main endobronchial mass close to a  left upper lobectomy margin, presumably local recurrence of bronchial carcinoid. Electronically Signed   By: Monte Fantasia M.D.   On: 06/11/2019 04:28   CT ABDOMEN PELVIS W CONTRAST  Result Date: 07/03/2019 CLINICAL DATA:  Dysuria, urinary frequency, history of lung and liver cancer EXAM: CT ABDOMEN AND PELVIS WITH CONTRAST TECHNIQUE: Multidetector CT imaging of the abdomen and pelvis was performed using the standard protocol following bolus administration of intravenous contrast. CONTRAST:  160mL OMNIPAQUE IOHEXOL 300 MG/ML  SOLN COMPARISON:  06/11/2019, 03/15/2019 FINDINGS: Lower chest: There is progressive consolidation at the right lung base consistent with persistent pneumonia. Trace bilateral pleural effusions have developed in the interim. Hepatobiliary: 2.1 cm mass within the lateral segment left lobe liver, reference image 20, consistent with known metastatic disease. No other discrete liver abnormalities are identified. Calcified gallstones are seen without cholecystitis. Pancreas: Unremarkable. No pancreatic ductal dilatation or surrounding inflammatory changes. Spleen: Normal in size without focal abnormality. Adrenals/Urinary Tract: There is mild diffuse left renal atrophy, stable. Chronic scarring lateral aspect right kidney. Bladder is moderately distended without focal abnormality. The adrenals are normal. Stomach/Bowel: Normal appendix right lower quadrant. No bowel obstruction or ileus. Scattered diverticulosis of the distal colon without diverticulitis. Vascular/Lymphatic: Aortic atherosclerosis. No enlarged abdominal or pelvic lymph nodes. Reproductive: Prostate is enlarged measuring 5.6 x 5.6 cm in transverse dimension. Other: No abdominal wall hernia or abnormality. No abdominopelvic ascites. Musculoskeletal: There are no acute or destructive bony lesions. Punctate sclerotic foci  within the lumbar spine and bony pelvis are stable likely reflecting bone islands. Reconstructed images  demonstrate no additional findings. IMPRESSION: 1. Progressive right basilar airspace disease consistent with pneumonia. 2. Trace bilateral pleural effusions. 3. 2.1 cm left lobe liver lesion consistent with known metastatic disease. 4. Enlarged prostate. 5. Diverticulosis without diverticulitis. Electronically Signed   By: Randa Ngo M.D.   On: 07/03/2019 03:46   DG Chest Port 1 View  Result Date: 07/03/2019 CLINICAL DATA:  Urinary frequency EXAM: PORTABLE CHEST 1 VIEW COMPARISON:  CT 06/11/2019, radiograph 06/10/2019 FINDINGS: Worsening consolidative and interstitial opacity throughout the right hemithorax. Additional reticular changes in the left lung base likely reflecting scarring with associated tenting of the left hemidiaphragm. Suspect at least small right effusion and likely left effusion as well. No pneumothorax. Prominence of the cardiac silhouette may be related to portable technique. Calcified aorta is noted. Coronary calcification versus stent overlying the left heart border. No acute osseous or soft tissue abnormality. Degenerative changes are present in the imaged spine and shoulders. IMPRESSION: Worsening interstitial and airspace disease in the right hemithorax with associated volume loss and likely right pleural effusion. Additional scarring and opacity in the left lung base is present as well likely with trace left effusion. Features could reflect persistent infection versus recrudescence or superinfection. Aortic Atherosclerosis (ICD10-I70.0). Mild prominence of the cardiac silhouette, possibly related to portable technique. Electronically Signed   By: Lovena Le M.D.   On: 07/03/2019 01:51   DG Chest Port 1 View  Result Date: 06/10/2019 CLINICAL DATA:  Fever.  COVID-19 pneumonia. EXAM: PORTABLE CHEST 1 VIEW COMPARISON:  June 07, 2019 FINDINGS: Been significant interval development diffuse airspace opacities throughout the right lung field with focal areas of consolidation  involving the right lower lobe. There is an airspace opacity at the left lung base. There is no pneumothorax. No large pleural effusion. Aortic calcifications are noted. The heart size is normal. IMPRESSION: Significant interval development of bilateral airspace opacities, greatest on the right, concerning for multifocal pneumonia (viral or bacterial). Electronically Signed   By: Constance Holster M.D.   On: 06/10/2019 21:19    Micro Results     Recent Results (from the past 240 hour(s))  Urine culture     Status: Abnormal   Collection Time: 07/03/19  1:24 AM   Specimen: In/Out Cath Urine  Result Value Ref Range Status   Specimen Description IN/OUT CATH URINE  Final   Special Requests   Final    NONE Performed at [facility], [address] [place], [place] [address]    Culture 20,000 COLONIES/mL PROTEUS MIRABILIS (A)  Final   Report Status 07/04/2019 FINAL  Final   Organism ID, Bacteria PROTEUS MIRABILIS (A)  Final      Susceptibility   Proteus mirabilis - MIC*    AMPICILLIN <=2 SENSITIVE Sensitive     CEFAZOLIN <=4 SENSITIVE Sensitive     CEFTRIAXONE <=0.25 SENSITIVE Sensitive     CIPROFLOXACIN <=0.25 SENSITIVE Sensitive     GENTAMICIN <=1 SENSITIVE Sensitive     IMIPENEM 4 SENSITIVE Sensitive     NITROFURANTOIN 128 RESISTANT Resistant     TRIMETH/SULFA <=20 SENSITIVE Sensitive     AMPICILLIN/SULBACTAM <=2 SENSITIVE Sensitive     PIP/TAZO <=4 SENSITIVE Sensitive     * 20,000 COLONIES/mL PROTEUS MIRABILIS  Blood Culture (routine x 2)     Status: None (Preliminary result)   Collection Time: 07/03/19  1:29 AM   Specimen: BLOOD  Result Value  Ref Range Status   Specimen Description BLOOD RIGHT WRIST  Final   Special Requests   Final    BOTTLES DRAWN AEROBIC AND ANAEROBIC Blood Culture adequate volume   Culture NO GROWTH 2 DAYS  Final   Report Status PENDING  Incomplete  Blood Culture (routine x 2)     Status: None (Preliminary result)   Collection Time: 07/03/19  1:39  AM   Specimen: BLOOD  Result Value Ref Range Status   Specimen Description BLOOD LEFT ARM  Final   Special Requests   Final    BOTTLES DRAWN AEROBIC AND ANAEROBIC Blood Culture adequate volume   Culture NO GROWTH 2 DAYS  Final   Report Status PENDING  Incomplete       Today   Subjective:   Jason Malone today has no headache,no chest abdominal pain,no new weakness tingling or numbness, feels much better wants to go home today.   Objective:   Blood pressure 134/61, pulse 64, temperature 97.7 F (36.5 C), resp. rate 19, height 5\' 4"  (1.626 m), weight 65.8 kg, SpO2 92 %.   Intake/Output Summary (Last 24 hours) at 07/06/2019 1159 Last data filed at 07/06/2019 0500 Gross per 24 hour  Intake 1280 ml  Output 1200 ml  Net 80 ml    Exam Awake Alert, Oriented x 3, No new F.N deficits, Normal affect Symmetrical Chest wall movement, Good air movement bilaterally, CTAB RRR,No Gallops,Rubs or new Murmurs, No Parasternal Heave +ve B.Sounds, Abd Soft, Non tender, No organomegaly appriciated, No rebound -guarding or rigidity. No Cyanosis, Clubbing or edema, No new Rash or bruise  Data Review   CBC w Diff:  Lab Results  Component Value Date   WBC 21.3 (H) 07/06/2019   HGB 11.6 (L) 07/06/2019   HGB 14.5 12/17/2016   HCT 35.7 (L) 07/06/2019   HCT 44.1 12/17/2016   PLT 144 (L) 07/06/2019   PLT 218 12/17/2016   LYMPHOPCT 2 07/06/2019   LYMPHOPCT 10.8 03/30/2013   MONOPCT 7 07/06/2019   MONOPCT 15.9 03/30/2013   EOSPCT 0 07/06/2019   EOSPCT 1.6 03/30/2013   BASOPCT 0 07/06/2019   BASOPCT 0.5 03/30/2013    CMP:  Lab Results  Component Value Date   NA 141 07/06/2019   NA 143 12/17/2016   NA 139 03/30/2013   K 3.9 07/06/2019   K 3.4 (L) 03/30/2013   CL 104 07/06/2019   CL 108 (H) 03/30/2013   CO2 27 07/06/2019   CO2 24 03/30/2013   BUN 19 07/06/2019   BUN 17 12/17/2016   BUN 13 03/30/2013   CREATININE 0.91 07/06/2019   CREATININE 0.88 02/12/2016   PROT 5.1 (L)  07/06/2019   PROT 6.6 12/17/2016   PROT 6.7 03/28/2013   ALBUMIN 1.9 (L) 07/06/2019   ALBUMIN 4.4 12/17/2016   ALBUMIN 3.2 (L) 03/28/2013   BILITOT 0.7 07/06/2019   BILITOT 0.5 12/17/2016   BILITOT 0.4 03/28/2013   ALKPHOS 118 07/06/2019   ALKPHOS 101 03/28/2013   AST 24 07/06/2019   AST 22 03/28/2013   ALT 32 07/06/2019   ALT 26 03/28/2013  .   Total Time in preparing paper work, data evaluation and todays exam - 87 minutes  Phillips Climes M.D on 07/06/2019 at 11:59 AM  [facility]   Office  [phone]

## 2019-07-06 NOTE — Progress Notes (Signed)
Occupational Therapy Treatment Patient Details Name: FORREST JAROSZEWSKI MRN: [id number] DOB: 1940-11-14 Today's Date: 07/06/2019    History of present illness Pt is 79 yo male with PMH significant of BPH with LUTS, HTN, hyperlipidemia, metastatic lung cancer, afib, and DM2, recent hospital admission in Jan 2021 for COVID 19 viral PNE.  Pt admitted with pne and urinary retention.   OT comments  Pt. Seen for skilled OT treatment session.  Pt. Able to complete bed mobility and short distance ambulation to recliner.  Completion of grooming tasks with integration of energy conservation strategies.  Pt. Initiates rest breaks appropriately without cues.  Provided energy conservation strategies handouts for pt. To review.  Pt. Is eager for d/c home when able.    Follow Up Recommendations  No OT follow up    Equipment Recommendations  3 in 1 bedside commode;Other (comment);Tub/shower seat    Recommendations for Other Services      Precautions / Restrictions Precautions Precautions: Fall Precaution Comments: Watch O2       Mobility Bed Mobility Overal bed mobility: Needs Assistance Bed Mobility: Supine to Sit     Supine to sit: Supervision        Transfers Overall transfer level: Needs assistance Equipment used: None Transfers: Sit to/from [employer] Transfers Sit to Stand: Supervision Stand pivot transfers: Supervision            Balance                                           ADL either performed or assessed with clinical judgement   ADL Overall ADL's : Needs assistance/impaired     Grooming: Set up;Supervision/safety;Sitting;Wash/dry face;Oral care Grooming Details (indicate cue type and reason): initiated rest breaks appropriately after each task-washing face and oral care                 Toilet Transfer: Supervision/safety;Ambulation;Stand-pivot Toilet Transfer Details (indicate cue type and reason): simulated during eob to  recliner Toileting- Clothing Manipulation and Hygiene: Supervision/safety;Sit to/from stand Toileting - Clothing Manipulation Details (indicate cue type and reason): simulated with in room mobility     Functional mobility during ADLs: Supervision/safety General ADL Comments: initiated rest breaks and pursed lip breathing without cues.  provided energy conservation handouts and reviewed with pt.     Vision       Perception     Praxis      Cognition Arousal/Alertness: Awake/alert Behavior During Therapy: WFL for tasks assessed/performed Overall Cognitive Status: Within Functional Limits for tasks assessed                                          Exercises     Shoulder Instructions       General Comments      Pertinent Vitals/ Pain          Home Living                                          Prior Functioning/Environment              Frequency  Min 2X/week        Progress Toward Goals  OT Goals(current  goals can now be found in the care plan section)  Progress towards OT goals: Progressing toward goals     Plan Discharge plan remains appropriate    Co-evaluation                 AM-PAC OT "6 Clicks" Daily Activity     Outcome Measure   Help from another person eating meals?: None Help from another person taking care of personal grooming?: A Little Help from another person toileting, which includes using toliet, bedpan, or urinal?: A Little Help from another person bathing (including washing, rinsing, drying)?: A Little Help from another person to put on and taking off regular upper body clothing?: A Little Help from another person to put on and taking off regular lower body clothing?: A Little 6 Click Score: 19    End of Session Equipment Utilized During Treatment: Oxygen  OT Visit Diagnosis: Unsteadiness on feet (R26.81);Muscle weakness (generalized) (M62.81)   Activity Tolerance Patient tolerated  treatment well   Patient Left in chair;with call bell/phone within reach   Nurse Communication          Time: 0912-0931 OT Time Calculation (min): 19 min  Charges: OT General Charges $OT Visit: 1 Visit OT Treatments $Self Care/Home Management : 8-22 mins  Sonia Baller, [place]   Janice Coffin 07/06/2019, 12:45 PM

## 2019-07-06 NOTE — TOC Transition Note (Addendum)
Transition of Care [facility]) - CM/SW Discharge Note   Patient Details  Name: Jason Malone MRN: [id number] Date of Birth: [birth year]  [facility] CM/SW Contact:  Maryclare Labrador, RN Phone Number: 07/06/2019, 1:33 PM   Clinical Narrative:   Pt admitted earlier this month for COVID now presenting with PNA.  Pt deemed stable to discharge home today.  Pt informed CM that he lives with his wife and that she will pick him up at discharge.  Pt also confirms that he is active with [facility] - agency aware and will resume [place] services. Pt would benefit from [facility] as he was recently admitted for [place] and now readmitted with PNA - agency accepts addition of [place].   Pt declined all recommended equipment.  Pt in agreement for CM to speak with his wife.  Wife aware that pt will discharge home today and plans to pick him up at 2pm.  Wife informed that pt declined DME - wife chuckled and told CM that even if he accepted it he would not use it.  Wife informed that she can also purchase equipment at a local store if deemed necessary post discharge and pt in agreement.  No other CM needs determined - CM signing off    Final next level of care: [place] Barriers to Discharge: Barriers Resolved   Patient Goals and CMS Choice Patient states their goals for this hospitalization and ongoing recovery are:: Pt denied having goals for hosptialization or ongoing recovery CMS Medicare.gov Compare Post Acute Care list provided to:: Patient Choice offered to / list presented to : Patient  Discharge Placement                       Discharge Plan and Services                          HH Arranged: PT, OT, Social Work, RN [facility] Agency: [facility] Date [place]: 07/06/19 Time [place]: [place] Representative spoke with at [place]: [name]  Social Determinants of Health ([place]) Interventions     Readmission Risk Interventions Readmission Risk  Prevention Plan 07/06/2019 06/17/2019  Transportation Screening Complete Complete  PCP or Specialist Appt within 5-7 Days - Complete  PCP or Specialist Appt within 3-5 Days Complete -  Home Care Screening - Complete  Medication Review (RN CM) - Complete  HRI or Home Care Consult Complete -  Palliative Care Screening Not Applicable -  Medication Review (RN Care Manager) Complete -  Some recent data might be hidden

## 2019-07-06 NOTE — Progress Notes (Signed)
Physical Therapy Treatment Patient Details Name: Jason Malone MRN: [id number] DOB: [birth year] Today's Date: 07/06/2019    History of Present Illness Pt is a 79 y.o. male with recent admission for COVID-19 PNA (05/2019), now admitted 07/03/19 with PNA and urinary retention. PMH includes BPH, HTN, HLD, DM2, afib, metastatic lung CA.   PT Comments    Pt preparing for d/c home this afternoon. Able to perform dynamic seated and standing tasks at supervision-level without DME, requiring prolonged seated rest breaks after minimal activity due to fatigue; SpO2 down to 83% on 5L O2 [place], returning to 90% with seated rest and pursed lip breathing. Pt pleasantly declining RW for home. Reinforced educ re: energy conservation, activity recommendations, self-monitoring activity tolerance and pulse ox use. Pt demonstrates good insight into current deficits.    Follow Up Recommendations  Home health PT;Supervision - Intermittent     Equipment Recommendations  Rolling walker with 5" wheels(pt declined)    Recommendations for Other Services       Precautions / Restrictions Precautions Precautions: Fall;Other (comment) Precaution Comments: Watch O2, quick to desaturate with minimal activity Restrictions Weight Bearing Restrictions: No    Mobility  Bed Mobility Overal bed mobility: Needs Assistance Bed Mobility: Supine to Sit     Supine to sit: Supervision     General bed mobility comments: Received sitting in recliner  Transfers Overall transfer level: Needs assistance Equipment used: None Transfers: Sit to/from Stand Sit to Stand: Supervision Stand pivot transfers: Supervision       General transfer comment: Declined use of DME; performed multiple sit<>stands from recliner at supervision-level; SpO2 down to 84% after ~30 sec of static standing (on 5L O2 [place])  Ambulation/Gait                 Stairs             Wheelchair Mobility    Modified Rankin (Stroke Patients  Only)       Balance Overall balance assessment: Needs assistance   Sitting balance-Leahy Scale: Good Sitting balance - Comments: Indep to don shoes and pants sitting at EOB recliner; SpO2 down to 83% on 5L O2 with this activity requiring prolonged seated rest to recover to 90%   Standing balance support: Single extremity supported;During functional activity Standing balance-Leahy Scale: Fair Standing balance comment: Static and dynamic standing tasks without UE support, supervision for safety                            Cognition Arousal/Alertness: Awake/alert Behavior During Therapy: WFL for tasks assessed/performed Overall Cognitive Status: Within Functional Limits for tasks assessed                                 General Comments: Very good insight into current deficits, great self-monitoring of symptoms and pulse ox      Exercises      General Comments General comments (skin integrity, edema, etc.): Reviewed precautions and education regarding energy conservation, activity recommendations, self-monitoring symptoms, pulse ox use; pt declining DME for d/c home      Pertinent Vitals/Pain Pain Assessment: No/denies pain    Home Living                      Prior Function            PT Goals (current goals can now be found in  the care plan section) Progress towards PT goals: Progressing toward goals    Frequency    Min 3X/week      PT Plan Current plan remains appropriate    Co-evaluation              AM-PAC PT "6 Clicks" Mobility   Outcome Measure  Help needed turning from your back to your side while in a flat bed without using bedrails?: None Help needed moving from lying on your back to sitting on the side of a flat bed without using bedrails?: None Help needed moving to and from a bed to a chair (including a wheelchair)?: None Help needed standing up from a chair using your arms (e.g., wheelchair or bedside  chair)?: None Help needed to walk in hospital room?: A Little Help needed climbing 3-5 steps with a railing? : A Little 6 Click Score: 22    End of Session Equipment Utilized During Treatment: Oxygen Activity Tolerance: Patient tolerated treatment well;Treatment limited secondary to agitation Patient left: in chair;with call bell/phone within reach Nurse Communication: Mobility status PT Visit Diagnosis: Unsteadiness on feet (R26.81);Other abnormalities of gait and mobility (R26.89)     Time: 8325-4982 PT Time Calculation (min) (ACUTE ONLY): 20 min  Charges:  $Therapeutic Activity: 8-22 mins                    Mabeline Caras, PT, DPT Acute Rehabilitation Services  Pager [phone] Office [place] 07/06/2019, 3:32 PM

## 2019-07-08 LAB — CULTURE, BLOOD (ROUTINE X 2)
Culture: NO GROWTH
Culture: NO GROWTH
Special Requests: ADEQUATE
Special Requests: ADEQUATE

## 2019-07-09 ENCOUNTER — Telehealth: Payer: Self-pay | Admitting: Cardiovascular Disease

## 2019-07-09 ENCOUNTER — Telehealth: Payer: Self-pay

## 2019-07-09 NOTE — Telephone Encounter (Signed)
Spoke with patients wife per release form and she reports his heart rates this morning was sustaining in the 120's this morning and would not go down. Finally she gave him 1 tablet of Carvedilol 3.125 mg with heart rate of 78 and blood pressure is currently at 150/66. Nurse is there assessing patient and provided that manual blood pressure reading. Patients wife wanted to know if they could restart this medication and if so how frequently could he take it safely. Advised that I would send this message to Dr. Rockey Situ for review and further recommendations. She did want to know if she could give him another pill if it happens before hearing back from provider. Advised that should be fine and I will mark this as urgent for him to review. She verbalized understanding with no further questions at this time.

## 2019-07-09 NOTE — Telephone Encounter (Signed)
Transition Care Management Follow-up Telephone Call  Date of discharge and from where: 07/06/19 Jason Malone  How have you been since you were released from the hospital? Pt's wife Jason Malone states he is c/o constipation type pain but he is still having bowel movements. He does have foley catheter. Anytime he gets up to do anything his heart rate goes up and oxygen level goes down.   Any questions or concerns? Yes  - increased heart rate.   [facility] home [facility] RN scheduled to come out this afternoon.  Pt's wife gave him a carvedilol at 9:55 am this morning to try to reduce heart rate even though that has been discontinued by Dr. Rockey Situ. Heart rate currently still in the 90s. She is considering calling Dr. Rockey Situ regarding heart rate. Pt is on 4L continuous O2 that is occasionally increased for short intervals after exertion.   Items Reviewed:  Did the pt receive and understand the discharge instructions provided? Yes   Medications obtained and verified? Yes   Any new allergies since your discharge? No   Dietary orders reviewed? Yes  Do you have support at home? Yes   Functional Questionnaire: (I = Independent and D = Dependent) ADLs: D  Bathing/Dressing- D  Meal Prep- D  Eating- I  Maintaining continence- D  Transferring/Ambulation- I with assistance  Managing Meds- D  Follow up appointments reviewed:   PCP Hospital f/u appt confirmed? Yes  Scheduled to see Delsa Grana PAC on 07/10/19 @ 3:20.  [facility] f/u appt confirmed? Yes    Dr. Grayland Ormond 07/16/19   Dr. Karsten Ro 07/17/19 urology  Dr. Patsey Berthold 07/31/19 pulmonology   Are transportation arrangements needed? No   If their condition worsens, is the pt aware to call PCP or go to the Emergency Dept.? Yes  Was the patient provided with contact information for the PCP's office or ED? Yes  Was to pt encouraged to call back with questions or concerns? Yes

## 2019-07-09 NOTE — Telephone Encounter (Signed)
STAT if HR is under 50 or over 120 (normal HR is 60-100 beats per minute)  1) What is your heart rate?  Right now is 74  2) Do you have a log of your heart rate readings (document readings)? 9:55 am Carvedilol was taken , 127 this morning for several hours  3) Do you have any other symptoms?   Patient wife states this happens frequently around 3 am. She asks if she can give Carvedilol before he goes to bed.

## 2019-07-10 ENCOUNTER — Ambulatory Visit (INDEPENDENT_AMBULATORY_CARE_PROVIDER_SITE_OTHER): Payer: Medicare Other | Admitting: Family Medicine

## 2019-07-10 ENCOUNTER — Other Ambulatory Visit: Payer: Self-pay

## 2019-07-10 ENCOUNTER — Encounter: Payer: Self-pay | Admitting: Family Medicine

## 2019-07-10 VITALS — BP 124/67 | HR 92 | Temp 99.3°F | Ht 64.0 in | Wt 133.0 lb

## 2019-07-10 DIAGNOSIS — J9601 Acute respiratory failure with hypoxia: Secondary | ICD-10-CM | POA: Diagnosis not present

## 2019-07-10 DIAGNOSIS — Z09 Encounter for follow-up examination after completed treatment for conditions other than malignant neoplasm: Secondary | ICD-10-CM | POA: Diagnosis not present

## 2019-07-10 DIAGNOSIS — R222 Localized swelling, mass and lump, trunk: Secondary | ICD-10-CM

## 2019-07-10 DIAGNOSIS — J1282 Pneumonia due to coronavirus disease 2019: Secondary | ICD-10-CM

## 2019-07-10 DIAGNOSIS — U071 COVID-19: Secondary | ICD-10-CM | POA: Diagnosis not present

## 2019-07-10 DIAGNOSIS — R000 Tachycardia, unspecified: Secondary | ICD-10-CM

## 2019-07-10 DIAGNOSIS — E119 Type 2 diabetes mellitus without complications: Secondary | ICD-10-CM

## 2019-07-10 MED ORDER — BLOOD GLUCOSE TEST VI STRP: ORAL_STRIP | 1 refills | Status: DC

## 2019-07-10 NOTE — Progress Notes (Signed)
Pt was admitted to the hospital early morning before appt

## 2019-07-10 NOTE — Progress Notes (Signed)
Name: Jason Malone   MRN: [id number]    DOB: 1941/01/15   Date:07/10/2019       Progress Note  Chief Complaint  Patient presents with  . Hypotension  . hypoxia     Subjective:   Jason Malone is a 79 y.o. male, presents to clinic for routine follow up on the conditions listed above.  Here for hospital follow up/transition of care.  Admit date: 07/03/2019 Discharge date: 07/06/2019 Transition of care was initiated previously by Clemetine Marker on 07/09/2019 and med changes, diagnosis, specialist follow ups and pts symptoms and condition were all reviewed.  Pt was admitted for acute respiratory failure with hypoxia secondary to COVID pneumonia New medications started per hospitalization include - finishing prednisone and keflex Pt has indwelling catheter - has f/up with urology next week Labs due today are CBC and CMP - pt's wife did not feel she could get him to the clinic Pt feels like he is getting a little better again.   Hospital course, H&P, labs, imaging, discharge summary all reviewed today Chest x-ray showing worsening interstitial and airspace disease in the right hemithorax with associated volume loss and likely right pleural effusion. In addition, there is scarring and opacity in the left lung base likely with trace left effusion. Features suspicious for persistent infection versus recrudescence or superinfection.    His wife is very concerned about his HR - states its "all over the place"  Today has been 90-110 With exertion she states it has been 127, 114 She has been giving him carvedilol to keep his heart rate low Currently pt is at rest, Hr 96, Oxygen on 4L [place] and SpO2 94-98% Pt denies any CP, diaphoresis, syncope/near syncope.  He does feel fatigued and feels his heart beat faster with any walking around his home.  Has HH, nurse coming in 2x a week Right now pt's wife has trouble getting him around with O2 and foley - they do have appts next week and later in the  month  Recommendations for primary care physician for things to follow:  -Please check CBC, CMP during next visit. -Will need to follow with urology as an outpatient for a voiding trial, appointment has been arranged. -Patient will need to keep up his appointment with pulmonary service for evaluation of new lung mass evaluation. Pt can not come in today and his wife is hesitant to bring him in at all, feels like any visits will be difficult, she asks if labs can be done at oncology and not here.   Pt is still having some wheeze - is finishing abx and prednisone  He is not using albuterol because wife was concerned that is was causing him diarrhea after his first admission to the hospital, still have not tried it after 2nd admission to the hospital for respiratory failure.  Pt's wife concerned that [facility] nurse heard wheezing when she examined him at home      Patient Active Problem List   Diagnosis Date Noted  . Hyponatremia 07/03/2019  . Metabolic acidosis 15/09/6977  . BPH (benign prostatic hyperplasia) 07/03/2019  . PNA (pneumonia) 07/03/2019  . Diabetes mellitus type 2, controlled, with complications ([place]) 48/05/6551  . HLD (hyperlipidemia) 06/14/2019  . 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 Respiratory failure ([place]) 06/11/2019  . Elevated LFTs 06/11/2019  . Pneumonia due to COVID-19 virus 06/10/2019  . Contracture of palmar fascia 10/11/2018  . Microalbuminuria due to type 2 diabetes mellitus ([place]) 04/12/2018  . Type 2  diabetes mellitus ([place]) 04/10/2018  . Osteopenia determined by x-ray 01/07/2017  . Neuroendocrine carcinoma of lung ([place]) 12/28/2016  . Low back pain 12/17/2016  . Liver mass, left lobe 08/28/2016  . 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 History of lung cancer 06/29/2016  . Aortic atherosclerosis ([place]) 04/06/2016  . Coronary artery disease involving native heart without angina pectoris 04/06/2016  . History of colonic polyps 10/28/2015  .  Medication monitoring encounter 08/15/2015  . Paroxysmal atrial flutter ([place])   . Elevated serum alkaline phosphatase level 03/05/2015  . Allergic rhinitis   . BPH without urinary obstruction   . Hyperlipidemia 02/13/2014  . Sinus bradycardia 07/18/2013  . S/P lobectomy of lung 06/04/2013  . Atrial flutter ([place]) 04/09/2013  . Smoking history 04/09/2013  . Essential hypertension 04/09/2013  . Rectal bleeding 12/15/2012    Past Surgical History:  Procedure Laterality Date  . BRONCHOSCOPY     04/06/2013  . CARDIOVASCULAR STRESS TEST  05/2013   a. No evidence of ischemia or infarct, EF 70%, no WMAs  . CATARACT EXTRACTION W/PHACO Left 07/14/2015   Procedure: CATARACT EXTRACTION PHACO AND INTRAOCULAR LENS PLACEMENT (IOC);  Surgeon: [name], MD;  Location: [place];  Service: Ophthalmology;  Laterality: Left;  . CATARACT EXTRACTION W/PHACO Right 10/01/2015   Procedure: CATARACT EXTRACTION PHACO AND INTRAOCULAR LENS PLACEMENT (IOC);  Surgeon: [name], MD;  Location: [place];  Service: Ophthalmology;  Laterality: Right;  . COLONOSCOPY W/ POLYPECTOMY     bleed after-had to go to surgery to stop bleeding via colonoscopy  . COLONOSCOPY WITH PROPOFOL N/A 11/19/2015   Procedure: COLONOSCOPY WITH PROPOFOL;  Surgeon: [name], MD;  [facility] ENDOSCOPY;  Service: Endoscopy;  Laterality: N/A;  . DUPUYTREN CONTRACTURE RELEASE  12/15/2011   Procedure: DUPUYTREN CONTRACTURE RELEASE;  Surgeon: [name], MD;  Location: [place];  Service: Orthopedics;  Laterality: Left;  Fasciotomy left ring finger dupuytrens  . FASCIECTOMY Right 11/07/2018   Procedure: SEGMENTAL FASCIECTOMY RIGHT RING FINGER;  Surgeon: Daryll Brod, MD;  Location: [place];  Service: Orthopedics;  Laterality: Right;  AXILLARY BLOCK  . IR ANGIOGRAM SELECTIVE EACH ADDITIONAL VESSEL  07/26/2017  . IR ANGIOGRAM SELECTIVE EACH ADDITIONAL VESSEL  07/26/2017   . IR ANGIOGRAM SELECTIVE EACH ADDITIONAL VESSEL  07/26/2017  . IR ANGIOGRAM SELECTIVE EACH ADDITIONAL VESSEL  07/26/2017  . IR ANGIOGRAM SELECTIVE EACH ADDITIONAL VESSEL  07/26/2017  . IR ANGIOGRAM SELECTIVE EACH ADDITIONAL VESSEL  08/11/2017  . IR ANGIOGRAM SELECTIVE EACH ADDITIONAL VESSEL  04/28/2018  . IR ANGIOGRAM VISCERAL SELECTIVE  07/26/2017  . IR ANGIOGRAM VISCERAL SELECTIVE  08/11/2017  . IR ANGIOGRAM VISCERAL SELECTIVE  04/28/2018  . IR EMBO ARTERIAL NOT HEMORR HEMANG INC GUIDE ROADMAPPING  07/26/2017  . IR EMBO TUMOR ORGAN ISCHEMIA INFARCT INC GUIDE ROADMAPPING  08/11/2017  . IR EMBO TUMOR ORGAN ISCHEMIA INFARCT INC GUIDE ROADMAPPING  04/28/2018  . IR RADIOLOGIST EVAL & MGMT  07/07/2017  . IR RADIOLOGIST EVAL & MGMT  09/07/2017  . IR RADIOLOGIST EVAL & MGMT  12/21/2017  . IR RADIOLOGIST EVAL & MGMT  03/22/2018  . IR RADIOLOGIST EVAL & MGMT  06/08/2018  . IR RADIOLOGIST EVAL & MGMT  11/01/2018  . IR RADIOLOGIST EVAL & MGMT  03/21/2019  . IR US GUIDE VASC ACCESS RIGHT  07/26/2017  . IR US GUIDE VASC ACCESS RIGHT  08/11/2017  . IR US GUIDE VASC ACCESS RIGHT  04/28/2018  .  LUNG LOBECTOMY  06/10/13   upper left lung  . MULTIPLE TOOTH EXTRACTIONS    . REMOVAL RETAINED LENS Right 11/17/2015   Procedure: REMOVAL RETAINED LENS FROAGMENTS RIGHT EYE;  Surgeon: [name], MD;  Location: [place];  Service: Ophthalmology;  Laterality: Right;  . TONSILLECTOMY    . UPPER GASTROINTESTINAL ENDOSCOPY  11-03-15   Dr [name]  . VASECTOMY    . VIDEO ASSISTED THORACOSCOPY (VATS)/WEDGE RESECTION Left 06/04/2013   Procedure: VIDEO ASSISTED THORACOSCOPY (VATS)/WEDGE RESECTION;  Surgeon: [name], MD;  Location: [place];  Service: Thoracic;  Laterality: Left;  [name] VIDEO BRONCHOSCOPY N/A 06/04/2013   Procedure: VIDEO BRONCHOSCOPY;  Surgeon: [name], MD;  Location: [facility] OR;  Service: Thoracic;  Laterality: N/A;    Family History  Problem Relation Age of Onset  . Stroke Mother   .  Alzheimer's disease Mother   . Hypertension Sister   . Hyperlipidemia Sister   . Hypertension Brother   . Hyperlipidemia Brother   . Diabetes Brother     Social History   Tobacco Use  . Smoking status: Former Smoker    Packs/day: 1.00    Years: 40.00    Pack years: 40.00    Types: Cigarettes    Quit date: 12/09/1998    Years since quitting: 20.5  . Smokeless tobacco: Never Used  Substance Use Topics  . Alcohol use: No  . Drug use: No      Current Outpatient Medications:  .  amLODipine (NORVASC) 2.5 MG tablet, Take 1 tablet by mouth once daily (Patient taking differently: Take 2.5 mg by mouth daily. ), Disp: 90 tablet, Rfl: 3 .  atorvastatin (LIPITOR) 40 MG tablet, TAKE 1 TABLET BY MOUTH ONCE DAILY FOR CHOLESTEROL (Patient taking differently: Take 40 mg by mouth daily. ), Disp: 90 tablet, Rfl: 1 .  Calcium Carbonate-Vitamin D3 (CALCIUM 600/VITAMIN D) 600-400 MG-UNIT TABS, Take 1 tablet by mouth daily. , Disp: , Rfl:  .  cephALEXin (KEFLEX) 500 MG capsule, Take 1 capsule (500 mg total) by mouth 3 (three) times daily for 5 days., Disp: 15 capsule, Rfl: 0 .  cholecalciferol (VITAMIN D3) 25 MCG (1000 UNIT) tablet, Take 1,000 Units by mouth daily., Disp: , Rfl:  .  Cinnamon 500 MG capsule, Take 500 mg by mouth at bedtime. BEDTIME, Disp: , Rfl:  .  finasteride (PROSCAR) 5 MG tablet, Take 1 tablet (5 mg total) by mouth daily., Disp: 90 tablet, Rfl: 1 .  guaiFENesin-dextromethorphan (ROBITUSSIN DM) 100-10 MG/5ML syrup, Take 10 mLs by mouth every 4 (four) hours as needed for cough., Disp: 118 mL, Rfl: 0 .  LANREOTIDE ACETATE [place], Inject 1 Dose into the skin every 30 (thirty) days., Disp: , Rfl:  .  losartan (COZAAR) 100 MG tablet, Take 1 tablet (100 mg total) by mouth daily., Disp: 90 tablet, Rfl: 1 .  Multiple Vitamins-Minerals (MULTIVITAMIN WITH MINERALS) tablet, Take 1 tablet by mouth daily. AM, Disp: , Rfl:  .  polycarbophil (FIBERCON) 625 MG tablet, Take 625 mg by mouth 2 (two) times  daily. AM, Disp: , Rfl:  .  predniSONE (DELTASONE) 10 MG tablet, Take 30 mg for 1 day, then  20 mg for 2 days, then 10 mg for 2 day, then stop, Disp: 9 tablet, Rfl: 0 .  tamsulosin (FLOMAX) 0.4 MG CAPS capsule, Take 2 capsules (0.8 mg total) by mouth at bedtime., Disp: 60 capsule, Rfl: 0 .  vitamin C (ASCORBIC ACID) 500 MG tablet, Take 500 mg by mouth  daily. AM, Disp: , Rfl:  .  zinc sulfate 220 (50 Zn) MG capsule, Take 1 capsule (220 mg total) by mouth daily., Disp: 30 capsule, Rfl: 0 No current facility-administered medications for this visit.  Facility-Administered Medications Ordered in Other Visits:  .  lanreotide acetate (SOMATULINE DEPOT) injection 120 mg, 120 mg, Subcutaneous, Once, Grayland Ormond, Kathlene November, MD  Allergies  Allergen Reactions  . Latex Itching    Tape only- REACTED TO BANDAGE ON ABDOMEN  . Tape Itching    Surgical tapes     Chart Review Today: I personally reviewed active problem list, medication list, allergies, family history, social history, health maintenance, notes from last encounter, lab results, imaging with the patient/caregiver today. (additional chart and hospitalization review as noted in HPI)  Review of Systems  Constitutional: Negative for diaphoresis and unexpected weight change.  HENT: Negative.   Eyes: Negative.   Respiratory: Positive for shortness of breath and wheezing.   Cardiovascular: Negative for chest pain, palpitations and leg swelling.  Gastrointestinal: Negative.   Endocrine: Negative.   Genitourinary: Negative.        Foley  Skin: Negative.  Negative for pallor.  Neurological: Negative for syncope.  Hematological: Negative.   All other systems reviewed and are negative.    Objective:    Vitals:   07/10/19 1521  BP: 124/67  Pulse: 92  Temp: 99.3 F (37.4 C)  TempSrc: Oral  SpO2: 93%  Weight: 133 lb (60.3 kg)  Height: 5\' 4"  (1.626 m)    Body mass index is 22.83 kg/m.  Physical Exam  Pt alert able to speak in short  sentences, no audible wheeze  PHQ2/9: Depression screen [facility] 2/9 07/10/2019 06/07/2019 04/11/2019 10/10/2018 04/24/2018  Decreased Interest 0 0 0 0 0  Down, Depressed, Hopeless 0 0 0 0 0  PHQ - 2 Score 0 0 0 0 0  Altered sleeping 2 0 0 0 -  Tired, decreased energy 3 0 0 0 -  Change in appetite 0 0 0 0 -  Feeling bad or failure about yourself  0 0 0 0 -  Trouble concentrating 0 0 0 0 -  Moving slowly or fidgety/restless 0 0 0 0 -  Suicidal thoughts 0 0 0 0 -  PHQ-9 Score 5 0 0 0 -  Difficult doing work/chores Not difficult at all - Not difficult at all Not difficult at all -  Some recent data might be hidden    phq 9 is positive - still fatigued, moods ok  Fall Risk: Fall Risk  07/10/2019 06/07/2019 04/11/2019 10/10/2018 04/24/2018  Falls in the past year? 0 0 0 0 0  Number falls in past yr: 0 0 0 - -  Injury with Fall? 0 0 0 - -  Comment - - - - -    Functional Status Survey: Is the patient deaf or have difficulty hearing?: No Does the patient have difficulty seeing, even when wearing glasses/contacts?: No Does the patient have difficulty concentrating, remembering, or making decisions?: Yes Does the patient have difficulty walking or climbing stairs?: Yes Does the patient have difficulty dressing or bathing?: Yes Does the patient have difficulty doing errands alone such as visiting a doctor's office or shopping?: Yes   Assessment & Plan:     ICD-10-CM   1. Acute respiratory failure with hypoxia (HCC)  J96.01 Comprehensive metabolic panel    CBC with Differential/Platelet  2. Mass in chest  R22.2 Comprehensive metabolic panel    CBC with Differential/Platelet  3.  Pneumonia due to COVID-19 virus  U07.1 Comprehensive metabolic panel   G99.24 CBC with Differential/Platelet  4. Encounter for examination following treatment at hospital  Z09 Comprehensive metabolic panel    CBC with Differential/Platelet  5. Type 2 diabetes mellitus without complication, without long-term current use of  insulin (HCC)  E11.9 Glucose Blood (BLOOD GLUCOSE TEST STRIPS) STRP    Comprehensive metabolic panel    CBC with Differential/Platelet  6. Tachycardia  R00.0 Comprehensive metabolic panel    CBC with Differential/Platelet   Encouraged pt to continue to keep SpO2 > 90% with Oxygen supplementation, finish steroid taper, contact me if any breathing/wheeze worsens in days following completion - encouraged pt to use albuterol inhaler PRN every 4-6 hour for wheeze, SOB, chest tightness.   Encouraged wife to desist from giving old d/c medications to control his HR - explained that with PNA and lung mass with with COVID, he still is having difficulty oxygenating his blood, when he does physical activity his HR will need to increase to help circulate RBC faster to keep everything oxygenated, and if she gives him old expired meds then it will actually be to his detriment - explained it will stop his body's ability to compensate for this respiratory failure I explained I'm OK with HR higher than his normal 80-130 - especially with exertion, and I would be concerned with much high HR or fast HR with CP, diaphoresis, syncope etc - pt said Dr. Rockey Situ was going to call them also  Will try and get labs done with [facility] nurse so pt does not have to go anywhere extra and can stay home and rest  He is still using O2 as previously prescribed, his BP is stable, I'm okay with some permissive tachycardia  We'll do a 2 week f/up to keep close watch on Jason Malone, Jason Malone 07/10/19 4:16 PM

## 2019-07-12 ENCOUNTER — Ambulatory Visit: Payer: Self-pay | Admitting: *Deleted

## 2019-07-12 ENCOUNTER — Telehealth: Payer: Self-pay | Admitting: Family Medicine

## 2019-07-12 DIAGNOSIS — R059 Cough, unspecified: Secondary | ICD-10-CM

## 2019-07-12 DIAGNOSIS — J9601 Acute respiratory failure with hypoxia: Secondary | ICD-10-CM

## 2019-07-12 DIAGNOSIS — R062 Wheezing: Secondary | ICD-10-CM

## 2019-07-12 DIAGNOSIS — R05 Cough: Secondary | ICD-10-CM

## 2019-07-12 DIAGNOSIS — J1282 Pneumonia due to coronavirus disease 2019: Secondary | ICD-10-CM

## 2019-07-12 MED ORDER — BENZONATATE 100 MG PO CAPS: 100.0000 mg | ORAL_CAPSULE | Freq: Three times a day (TID) | ORAL | 0 refills | Status: DC | PRN

## 2019-07-12 MED ORDER — PREDNISONE 10 MG PO TABS: ORAL_TABLET | ORAL | 0 refills | Status: DC

## 2019-07-12 MED ORDER — BUDESONIDE-FORMOTEROL FUMARATE 160-4.5 MCG/ACT IN AERO: 2.0000 | INHALATION_SPRAY | Freq: Two times a day (BID) | RESPIRATORY_TRACT | 3 refills | Status: DC

## 2019-07-12 NOTE — Telephone Encounter (Signed)
Can you please call and try and triage the patient.  He has respiratory failure from COVID pneumonia, also has lung mass.  He has been hospitalized twice in the last month for the above He is SOB and has rapid HR with exertion and is on O2 trying to keep SpO2 > 90%  He finished steroids yesterday, but as of Tuesday he had not been using the inhaler at all for wheeze.  He should be using the inhaler 2 puffs every 4-6 hours as needed for chest tightness/SOB/Wheeze.    I need to know if in the last two days he is feeling worse at all?   Requiring more O2 while at rest? having any more chest tightness?  I expect that he will still have SOB and some wheeze - I explained that with COVID and pneumonia it would be several weeks to months gradual recovery.  I explained to his wife that IF HE WORSENED after he finished the steroids that I could call in a little more to help taper off.    If he is having significant changes in his breathing then he needs to go to UC to get evaluated or come in here in person to get evaluated - I cannot do much more for him talking to his wife over the phone - it will be very helpful to examine him.    But if he is feeling worse after done with steroids lmk so I can call in a little more.  Please find out what you can!  Thanks! Kristeen Miss

## 2019-07-12 NOTE — Telephone Encounter (Signed)
Pharmacy notified regarding prescriptions of Symbicort inhaler and prednisone. Pharmacy stated that they did not receive the prescriptions. Looked like they had been phoned in. Prescriptions resent per phone call. Pt notified and voiced understanding.

## 2019-07-12 NOTE — Addendum Note (Signed)
Addended by: Delsa Grana on: 07/12/2019 04:02 PM   Modules accepted: Orders

## 2019-07-12 NOTE — Telephone Encounter (Signed)
Medication:predniSONE (DELTASONE) 10 MG tablet [[id number]] , budesonide-formoterol (SYMBICORT) 160-4.5 MCG/ACT inhaler [[id number]]   Can this please be resent? The pharmacy did not receive it.  Has the patient contacted their pharmacy? Yes  (Agent: If no, request that the patient contact the pharmacy for the refill.) (Agent: If yes, when and what did the pharmacy advise?)  Preferred Pharmacy (with phone number or street name): [place], [place] - [place]  Phone:  [phone] Fax:  [phone]     Agent: Please be advised that RX refills may take up to 3 business days. We ask that you follow-up with your pharmacy.

## 2019-07-12 NOTE — Telephone Encounter (Signed)
Information relayed to patient by phone, Mychart message sent to them as well with information in case they have questions to refer back to.

## 2019-07-12 NOTE — Telephone Encounter (Signed)
I spoke with the patient's wife and they had a nurse come by today and she listened to his lungs and said there was wheezing in every part of his lungs"lobes" Ria Comment, [place], [facility]). Patient's wife states he is coughing more and more at night, they would like more steroids and antibiotics called in if possible. He is using his inhaler, he is not using more O2 while resting and his chest is not tight, just sore from all the coughing.

## 2019-07-12 NOTE — Telephone Encounter (Signed)
[facility] home health RN, Mendel Ryder reports that the patient has wheeing in all his lungs. Had covid and pneumonia on Jan 31 st. Denies fever. Has sob with exertion Has used albuterol and incentive spirometry,today. Finished up the prednisone on Wednesday.   Pt stated that he was supposed to call back if he had not got any better and his provider would order more prednisone for him. [facility] notified regarding recommendations.  Lindsey,RN # to call is [phone] for recommendations. Routing to the practice for review.   Reason for Disposition . Nursing judgment or information in reference  Answer Assessment - Initial Assessment Questions 1. REASON FOR CALL: "What is your main concern right now?"     wheezing 2. ONSET: "When did the  start?"      3. SEVERITY: "How bad is the wheezing?"     Noted in all lungs 4. FEVER: "Do you have a fever?"     no 5. OTHER SYMPTOMS: "Do you have any other new symptoms?"     no 6. INTERVENTIONS AND RESPONSE: "What have you done so far to try to make this better? What medications have you used?"     Using the inhaler and finished prednisone 7. PREGNANCY: "Is there any chance you are pregnant?"     n/a  Protocols used: NO GUIDELINE AVAILABLE-A-AH

## 2019-07-13 MED ORDER — METOPROLOL TARTRATE 25 MG PO TABS: 25.0000 mg | ORAL_TABLET | Freq: Two times a day (BID) | ORAL | 3 refills | Status: DC | PRN

## 2019-07-13 NOTE — Telephone Encounter (Signed)
Called home health nurse lyndsey with [facility]: verbal orders given for CBC and CMP.  They will do at next visit Monday or Tuesday and fax results to [place].

## 2019-07-13 NOTE — Telephone Encounter (Signed)
Spoke with patients wife per release form and she states he has COVID. She reports that he has not had any more problems with fast heart rates since Monday. Advised that I would send in medication but that he needs to take sparingly only when he has fast heart rates. She verbalized understanding with no further questions at this time.

## 2019-07-13 NOTE — Telephone Encounter (Signed)
Carvedilol previously held for bradycardia orthostasis Would recommend he take metoprolol tartrate 25 mg twice daily as needed for episodes of tachycardia but not on a regular basis

## 2019-07-15 NOTE — Progress Notes (Signed)
[place]  [phone] Fax:(336) [phone]  ID: Danne Harbor OB: 1940/12/12  MR#: [id number]  ZSM#:[id number]  Patient Care Team: Delsa Grana, PA-C as PCP - General (Family Medicine) [name], Forest Gleason, MD (General Surgery) Nestor Lewandowsky, MD as Referring Physician (Cardiothoracic Surgery) Minna Merritts, MD as Consulting Physician (Cardiology) [name], MD as Consulting Physician (Cardiothoracic Surgery) Lloyd Huger, MD as Consulting Physician (Oncology) Sanda Klein Satira Anis, MD as Attending Physician (Family Medicine)   CHIEF COMPLAINT: 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  INTERVAL HISTORY: Patient returns to clinic today for further evaluation and continuation of lanreotide.  He continues to have an oxygen requirement, shortness of breath, cough from his recent Covid infection.  He continues to have weakness and fatigue, but states this is improving.  He has urinary retention and a Foley placed. He has no neurologic complaints.  He has a good appetite and denies weight loss.  He denies any chest pain or hemoptysis.  He denies any abdominal pain.  He has no nausea, vomiting, constipation, or diarrhea.  He has no urinary complaints.  Patient offers no further specific complaints today.  REVIEW OF SYSTEMS:   Review of Systems  Constitutional: Positive for malaise/fatigue. Negative for fever and weight loss.  Respiratory: Positive for cough and shortness of breath. Negative for hemoptysis.   Cardiovascular: Negative.  Negative for chest pain and leg swelling.  Gastrointestinal: Negative.  Negative for abdominal pain, diarrhea, nausea and vomiting.  Genitourinary: Negative.  Negative for dysuria.  Musculoskeletal: Negative.  Negative for back pain.  Skin: Negative.  Negative for rash.  Neurological: Positive for weakness. Negative for dizziness, sensory change and focal weakness.  Psychiatric/Behavioral:  Negative.  The patient is not nervous/anxious.     As per HPI. Otherwise, a complete review of systems is negative.  PAST MEDICAL HISTORY: Past Medical History:  Diagnosis Date  . Allergic rhinitis   . Benign prostatic hypertrophy with lower urinary tract symptoms (LUTS)   . Chest pain    a. 05/2013 MV: EF 70%, no ischemia.  . Diverticulitis    DIVERTICULOSIS  . Enlarged prostate   . Essential hypertension    CONTROLLED ON MEDS  . Full dentures    upper and lower  . H/O hypokalemia   . Hx of atrial fibrillation without current medication    CARDIOLOGIST-DR [facility]  . 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 Hyperlipemia   . Hypomagnesemia   . IFG (impaired fasting glucose)   . Liver cancer ([place])   . Liver cancer ([place]) 12/08/2016  . Liver mass, left lobe 08/28/2016   Probably hemangioma; will get MRI of liver, refer to GI  . Lung cancer ([place])    a. carcinoid, left lung, Stage 1b (T2a, N0, cM0);  b. 05/2013 s/p VATS & LULobectomy.  . Monocytosis   . Osteopenia   . Paroxysmal atrial flutter ([facility])    a. 03/2013->no recurrence;  b. CHA2DS2VASc = 2-->not currently on anticoagulation;  c. 05/2012 Echo: EF 55-60%, normal RV.  [name] Wears glasses     PAST SURGICAL HISTORY: Past Surgical History:  Procedure Laterality Date  . BRONCHOSCOPY     04/06/2013  . CARDIOVASCULAR STRESS TEST  05/2013   a. No evidence of ischemia or infarct, EF 70%, no WMAs  . CATARACT EXTRACTION W/PHACO Left 07/14/2015   Procedure: CATARACT EXTRACTION PHACO AND INTRAOCULAR LENS PLACEMENT (IOC);  Surgeon: [name], MD;  Location: [place];  Service: Ophthalmology;  Laterality: Left;  . CATARACT EXTRACTION W/PHACO Right 10/01/2015   Procedure: CATARACT EXTRACTION PHACO AND INTRAOCULAR LENS PLACEMENT (IOC);  Surgeon: [name], MD;  Location: [place];  Service: Ophthalmology;  Laterality: Right;  . COLONOSCOPY W/ POLYPECTOMY     bleed  after-had to go to surgery to stop bleeding via colonoscopy  . COLONOSCOPY WITH PROPOFOL N/A 11/19/2015   Procedure: COLONOSCOPY WITH PROPOFOL;  Surgeon: [name], MD;  Location: [facility] ENDOSCOPY;  Service: Endoscopy;  Laterality: N/A;  . DUPUYTREN CONTRACTURE RELEASE  12/15/2011   Procedure: DUPUYTREN CONTRACTURE RELEASE;  Surgeon: [name], MD;  Location: [place];  Service: Orthopedics;  Laterality: Left;  Fasciotomy left ring finger dupuytrens  . FASCIECTOMY Right 11/07/2018   Procedure: SEGMENTAL FASCIECTOMY RIGHT RING FINGER;  Surgeon: Daryll Brod, MD;  Location: [place];  Service: Orthopedics;  Laterality: Right;  AXILLARY BLOCK  . IR ANGIOGRAM SELECTIVE EACH ADDITIONAL VESSEL  07/26/2017  . IR ANGIOGRAM SELECTIVE EACH ADDITIONAL VESSEL  07/26/2017  . IR ANGIOGRAM SELECTIVE EACH ADDITIONAL VESSEL  07/26/2017  . IR ANGIOGRAM SELECTIVE EACH ADDITIONAL VESSEL  07/26/2017  . IR ANGIOGRAM SELECTIVE EACH ADDITIONAL VESSEL  07/26/2017  . IR ANGIOGRAM SELECTIVE EACH ADDITIONAL VESSEL  08/11/2017  . IR ANGIOGRAM SELECTIVE EACH ADDITIONAL VESSEL  04/28/2018  . IR ANGIOGRAM VISCERAL SELECTIVE  07/26/2017  . IR ANGIOGRAM VISCERAL SELECTIVE  08/11/2017  . IR ANGIOGRAM VISCERAL SELECTIVE  04/28/2018  . IR EMBO ARTERIAL NOT HEMORR HEMANG INC GUIDE ROADMAPPING  07/26/2017  . IR EMBO TUMOR ORGAN ISCHEMIA INFARCT INC GUIDE ROADMAPPING  08/11/2017  . IR EMBO TUMOR ORGAN ISCHEMIA INFARCT INC GUIDE ROADMAPPING  04/28/2018  . IR RADIOLOGIST EVAL & MGMT  07/07/2017  . IR RADIOLOGIST EVAL & MGMT  09/07/2017  . IR RADIOLOGIST EVAL & MGMT  12/21/2017  . IR RADIOLOGIST EVAL & MGMT  03/22/2018  . IR RADIOLOGIST EVAL & MGMT  06/08/2018  . IR RADIOLOGIST EVAL & MGMT  11/01/2018  . IR RADIOLOGIST EVAL & MGMT  03/21/2019  . IR US GUIDE VASC ACCESS RIGHT  07/26/2017  . IR US GUIDE VASC ACCESS RIGHT  08/11/2017  . IR US GUIDE VASC ACCESS RIGHT  04/28/2018  . LUNG LOBECTOMY  06/10/13   upper  left lung  . MULTIPLE TOOTH EXTRACTIONS    . REMOVAL RETAINED LENS Right 11/17/2015   Procedure: REMOVAL RETAINED LENS FROAGMENTS RIGHT EYE;  Surgeon: [name], MD;  Location: [place];  Service: Ophthalmology;  Laterality: Right;  . TONSILLECTOMY    . UPPER GASTROINTESTINAL ENDOSCOPY  11-03-15   Dr [name]  . VASECTOMY    . VIDEO ASSISTED THORACOSCOPY (VATS)/WEDGE RESECTION Left 06/04/2013   Procedure: VIDEO ASSISTED THORACOSCOPY (VATS)/WEDGE RESECTION;  Surgeon: [name], MD;  Location: [place];  Service: Thoracic;  Laterality: Left;  [name] VIDEO BRONCHOSCOPY N/A 06/04/2013   Procedure: VIDEO BRONCHOSCOPY;  Surgeon: [name], MD;  Location: [facility] OR;  Service: Thoracic;  Laterality: N/A;    FAMILY HISTORY: Family History  Problem Relation Age of Onset  . Stroke Mother   . Alzheimer's disease Mother   . Hypertension Sister   . Hyperlipidemia Sister   . Hypertension Brother   . Hyperlipidemia Brother   . Diabetes Brother     ADVANCED DIRECTIVES (Y/N):  N  HEALTH MAINTENANCE: Social History   Tobacco Use  . Smoking status: Former Smoker    Packs/day: 1.00  Years: 40.00    Pack years: 40.00    Types: Cigarettes    Quit date: 12/09/1998    Years since quitting: 20.6  . Smokeless tobacco: Never Used  Substance Use Topics  . Alcohol use: No  . Drug use: No     Colonoscopy:  PAP:  Bone density:  Lipid panel:  Allergies  Allergen Reactions  . Latex Itching    Tape only- REACTED TO BANDAGE ON ABDOMEN  . Tape Itching    Surgical tapes     Current Outpatient Medications  Medication Sig Dispense Refill  . amLODipine (NORVASC) 2.5 MG tablet Take 1 tablet by mouth once daily (Patient taking differently: Take 2.5 mg by mouth daily. ) 90 tablet 3  . atorvastatin (LIPITOR) 40 MG tablet TAKE 1 TABLET BY MOUTH ONCE DAILY FOR CHOLESTEROL (Patient taking differently: Take 40 mg by mouth daily. ) 90 tablet 1  . benzonatate (TESSALON) 100 MG capsule  Take 1 capsule (100 mg total) by mouth 3 (three) times daily as needed for cough. 30 capsule 0  . Calcium Carbonate-Vitamin D3 (CALCIUM 600/VITAMIN D) 600-400 MG-UNIT TABS Take 1 tablet by mouth daily.     . cholecalciferol (VITAMIN D3) 25 MCG (1000 UNIT) tablet Take 1,000 Units by mouth daily.    . Cinnamon 500 MG capsule Take 500 mg by mouth at bedtime. BEDTIME    . finasteride (PROSCAR) 5 MG tablet Take 1 tablet (5 mg total) by mouth daily. 90 tablet 1  . Glucose Blood (BLOOD GLUCOSE TEST STRIPS) STRP Use as directed to monitor FSBS once daily. Dx: E11.9. 50 strip 1  . guaiFENesin-dextromethorphan (ROBITUSSIN DM) 100-10 MG/5ML syrup Take 10 mLs by mouth every 4 (four) hours as needed for cough. 118 mL 0  . LANREOTIDE ACETATE [place] Inject 1 Dose into the skin every 30 (thirty) days.    [name] losartan (COZAAR) 100 MG tablet Take 1 tablet (100 mg total) by mouth daily. 90 tablet 1  . metoprolol tartrate (LOPRESSOR) 25 MG tablet Take 1 tablet (25 mg total) by mouth 2 (two) times daily as needed (As needed for episodes of fast heart rate.). 180 tablet 3  . Multiple Vitamins-Minerals (MULTIVITAMIN WITH MINERALS) tablet Take 1 tablet by mouth daily. AM    . polycarbophil (FIBERCON) 625 MG tablet Take 625 mg by mouth 2 (two) times daily. AM    . predniSONE (DELTASONE) 10 MG tablet Take 20 mg (two tabs) poqam x 3 d, then take 10 mg poqam x 3 d, then take 5 mg (1/2 tablet) poqam x 4 d 11 tablet 0  . tamsulosin (FLOMAX) 0.4 MG CAPS capsule Take 2 capsules (0.8 mg total) by mouth at bedtime. 60 capsule 0  . vitamin C (ASCORBIC ACID) 500 MG tablet Take 500 mg by mouth daily. AM    . zinc sulfate 220 (50 Zn) MG capsule Take 1 capsule (220 mg total) by mouth daily. 30 capsule 0  . budesonide-formoterol (SYMBICORT) 160-4.5 MCG/ACT inhaler Inhale 2 puffs into the lungs 2 (two) times daily. (Patient not taking: Reported on 07/16/2019) 1 Inhaler 3   No current facility-administered medications for this visit.    Facility-Administered Medications Ordered in Other Visits  Medication Dose Route Frequency Provider Last Rate Last Admin  . lanreotide acetate (SOMATULINE DEPOT) injection 120 mg  120 mg Subcutaneous Once Lloyd Huger, MD        OBJECTIVE: Vitals:   07/16/19 1403  BP: 125/69  Pulse: 81  Resp: 20  Temp: [name])  97.1 F (36.2 C)  SpO2: 99%     Body mass index is 23.55 kg/m.    ECOG FS:2 - Symptomatic, <50% confined to bed  General: Well-developed, well-nourished, no acute distress.  Sitting in wheelchair. Eyes: Pink conjunctiva, anicteric sclera. HEENT: Normocephalic, moist mucous membranes. Lungs: No audible wheezing or coughing. Heart: Regular rate and rhythm. Abdomen: Soft, nontender, no obvious distention. Musculoskeletal: No edema, cyanosis, or clubbing. Neuro: Alert, answering all questions appropriately. Cranial nerves grossly intact. Skin: No rashes or petechiae noted. Psych: Normal affect.  LAB RESULTS:  Lab Results  Component Value Date   NA 132 (L) 07/16/2019   K 3.9 07/16/2019   CL 96 (L) 07/16/2019   CO2 25 07/16/2019   GLUCOSE 343 (H) 07/16/2019   BUN 21 07/16/2019   CREATININE 0.73 07/16/2019   CALCIUM 8.6 (L) 07/16/2019   PROT 6.5 07/16/2019   ALBUMIN 2.4 (L) 07/16/2019   AST 72 (H) 07/16/2019   ALT 111 (H) 07/16/2019   ALKPHOS 235 (H) 07/16/2019   BILITOT 0.5 07/16/2019   GFRNONAA >60 07/16/2019   GFRAA >60 07/16/2019    Lab Results  Component Value Date   WBC 37.7 (H) 07/16/2019   NEUTROABS 33.2 (H) 07/16/2019   HGB 12.0 (L) 07/16/2019   HCT 37.9 (L) 07/16/2019   MCV 93.8 07/16/2019   PLT 410 (H) 07/16/2019     STUDIES: CT ABDOMEN PELVIS W CONTRAST  Result Date: 07/03/2019 CLINICAL DATA:  Dysuria, urinary frequency, history of lung and liver cancer EXAM: CT ABDOMEN AND PELVIS WITH CONTRAST TECHNIQUE: Multidetector CT imaging of the abdomen and pelvis was performed using the standard protocol following bolus administration of  intravenous contrast. CONTRAST:  173mL OMNIPAQUE IOHEXOL 300 MG/ML  SOLN COMPARISON:  06/11/2019, 03/15/2019 FINDINGS: Lower chest: There is progressive consolidation at the right lung base consistent with persistent pneumonia. Trace bilateral pleural effusions have developed in the interim. Hepatobiliary: 2.1 cm mass within the lateral segment left lobe liver, reference image 20, consistent with known metastatic disease. No other discrete liver abnormalities are identified. Calcified gallstones are seen without cholecystitis. Pancreas: Unremarkable. No pancreatic ductal dilatation or surrounding inflammatory changes. Spleen: Normal in size without focal abnormality. Adrenals/Urinary Tract: There is mild diffuse left renal atrophy, stable. Chronic scarring lateral aspect right kidney. Bladder is moderately distended without focal abnormality. The adrenals are normal. Stomach/Bowel: Normal appendix right lower quadrant. No bowel obstruction or ileus. Scattered diverticulosis of the distal colon without diverticulitis. Vascular/Lymphatic: Aortic atherosclerosis. No enlarged abdominal or pelvic lymph nodes. Reproductive: Prostate is enlarged measuring 5.6 x 5.6 cm in transverse dimension. Other: No abdominal wall hernia or abnormality. No abdominopelvic ascites. Musculoskeletal: There are no acute or destructive bony lesions. Punctate sclerotic foci within the lumbar spine and bony pelvis are stable likely reflecting bone islands. Reconstructed images demonstrate no additional findings. IMPRESSION: 1. Progressive right basilar airspace disease consistent with pneumonia. 2. Trace bilateral pleural effusions. 3. 2.1 cm left lobe liver lesion consistent with known metastatic disease. 4. Enlarged prostate. 5. Diverticulosis without diverticulitis. Electronically Signed   By: Randa Ngo M.D.   On: 07/03/2019 03:46   DG Chest Port 1 View  Result Date: 07/03/2019 CLINICAL DATA:  Urinary frequency EXAM: PORTABLE CHEST  1 VIEW COMPARISON:  CT 06/11/2019, radiograph 06/10/2019 FINDINGS: Worsening consolidative and interstitial opacity throughout the right hemithorax. Additional reticular changes in the left lung base likely reflecting scarring with associated tenting of the left hemidiaphragm. Suspect at least small right effusion and likely left effusion as well.  No pneumothorax. Prominence of the cardiac silhouette may be related to portable technique. Calcified aorta is noted. Coronary calcification versus stent overlying the left heart border. No acute osseous or soft tissue abnormality. Degenerative changes are present in the imaged spine and shoulders. IMPRESSION: Worsening interstitial and airspace disease in the right hemithorax with associated volume loss and likely right pleural effusion. Additional scarring and opacity in the left lung base is present as well likely with trace left effusion. Features could reflect persistent infection versus recrudescence or superinfection. Aortic Atherosclerosis (ICD10-I70.0). Mild prominence of the cardiac silhouette, possibly related to portable technique. Electronically Signed   By: Lovena Le M.D.   On: 07/03/2019 01:51    ASSESSMENT: 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  PLAN:    1. 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 Patient underwent left upper lobe lobectomy in [place] on June 04, 2013. Final pathology results revealed a well differentiated neuroendocrine tumor with positive bronchial margins. Patient did not receive adjuvant XRT or chemotherapy at that time.  Patient's most recent abdominal MRI on March 15, 2019 reviewed independently with stable to decrease in size of known lesions in patient's liver.  His most recent Y 90 ablation occurred on April 28, 2018.  Patient's chromogranin a levels are elevated, but  relatively stable ranging from 213-261.  Proceed with 120 mg subcutaneous lanreotide today.  Return to clinic in 4 weeks for further evaluation and continuation of treatment. 2.  Left mainstem endobronchial mass: Mass is closer to the left upper lobe lobectomy margin, therefore likely a local recurrence of disease given his positive margins after surgery in January 2015.  Patient has an appointment with pulmonary on July 31, 2019 for biopsy and possible ablation of lesion. 3.  Covid: Resolved.  Patient slowly recovering and continues to have an oxygen requirement. 4.  Urinary retention: Foley in place.  Patient has an appointment with urology tomorrow. 5.  Elevated liver enzymes: Unclear etiology, monitor. 6.  Leukocytosis: Possibly reactive, monitor.   Patient expressed understanding and was in agreement with this plan. He also understands that He can call clinic at any time with any questions, concerns, or complaints.   Cancer Staging Neuroendocrine carcinoma of lung [facility]) Staging form: Lung, AJCC 8th Edition - Clinical stage from 12/28/2016: Stage IVA (cT2a, cN0, cM1b) - Signed by Lloyd Huger, MD on 12/28/2016   Lloyd Huger, MD   07/17/2019 6:41 AM

## 2019-07-16 ENCOUNTER — Inpatient Hospital Stay: Payer: Medicare Other | Attending: Oncology | Admitting: Oncology

## 2019-07-16 ENCOUNTER — Other Ambulatory Visit: Payer: Self-pay

## 2019-07-16 ENCOUNTER — Inpatient Hospital Stay: Payer: Medicare Other

## 2019-07-16 VITALS — BP 125/69 | HR 81 | Temp 97.1°F | Resp 20 | Wt 137.2 lb

## 2019-07-16 DIAGNOSIS — Z85118 Personal history of other malignant neoplasm of bronchus and lung: Secondary | ICD-10-CM | POA: Diagnosis not present

## 2019-07-16 DIAGNOSIS — C7A8 Other malignant neuroendocrine tumors: Secondary | ICD-10-CM | POA: Diagnosis not present

## 2019-07-16 DIAGNOSIS — Z8616 Personal history of COVID-19: Secondary | ICD-10-CM | POA: Diagnosis not present

## 2019-07-16 DIAGNOSIS — C7B02 Secondary carcinoid tumors of liver: Secondary | ICD-10-CM | POA: Insufficient documentation

## 2019-07-16 DIAGNOSIS — C7B8 Other secondary neuroendocrine tumors: Secondary | ICD-10-CM

## 2019-07-16 DIAGNOSIS — Z79899 Other long term (current) drug therapy: Secondary | ICD-10-CM | POA: Diagnosis not present

## 2019-07-16 LAB — CBC WITH DIFFERENTIAL/PLATELET
Abs Immature Granulocytes: 2.6 10*3/uL — ABNORMAL HIGH (ref 0.00–0.07)
Band Neutrophils: 4 %
Basophils Absolute: 0 10*3/uL (ref 0.0–0.1)
Basophils Relative: 0 %
Eosinophils Absolute: 0 10*3/uL (ref 0.0–0.5)
Eosinophils Relative: 0 %
HCT: 37.9 % — ABNORMAL LOW (ref 39.0–52.0)
Hemoglobin: 12 g/dL — ABNORMAL LOW (ref 13.0–17.0)
Lymphocytes Relative: 2 %
Lymphs Abs: 0.8 10*3/uL (ref 0.7–4.0)
MCH: 29.7 pg (ref 26.0–34.0)
MCHC: 31.7 g/dL (ref 30.0–36.0)
MCV: 93.8 fL (ref 80.0–100.0)
Metamyelocytes Relative: 1 %
Monocytes Absolute: 1.1 10*3/uL — ABNORMAL HIGH (ref 0.1–1.0)
Monocytes Relative: 3 %
Myelocytes: 6 %
Neutro Abs: 33.2 10*3/uL — ABNORMAL HIGH (ref 1.7–7.7)
Neutrophils Relative %: 84 %
Platelets: 410 10*3/uL — ABNORMAL HIGH (ref 150–400)
RBC: 4.04 MIL/uL — ABNORMAL LOW (ref 4.22–5.81)
RDW: 14.9 % (ref 11.5–15.5)
Smear Review: INCREASED
WBC: 37.7 10*3/uL — ABNORMAL HIGH (ref 4.0–10.5)
nRBC: 0 % (ref 0.0–0.2)

## 2019-07-16 LAB — COMPREHENSIVE METABOLIC PANEL WITH GFR
ALT: 111 U/L — ABNORMAL HIGH (ref 0–44)
AST: 72 U/L — ABNORMAL HIGH (ref 15–41)
Albumin: 2.4 g/dL — ABNORMAL LOW (ref 3.5–5.0)
Alkaline Phosphatase: 235 U/L — ABNORMAL HIGH (ref 38–126)
Anion gap: 11 (ref 5–15)
BUN: 21 mg/dL (ref 8–23)
CO2: 25 mmol/L (ref 22–32)
Calcium: 8.6 mg/dL — ABNORMAL LOW (ref 8.9–10.3)
Chloride: 96 mmol/L — ABNORMAL LOW (ref 98–111)
Creatinine, Ser: 0.73 mg/dL (ref 0.61–1.24)
GFR calc Af Amer: >60 mL/min
GFR calc non Af Amer: >60 mL/min
Glucose, Bld: 343 mg/dL — ABNORMAL HIGH (ref 70–99)
Potassium: 3.9 mmol/L (ref 3.5–5.1)
Sodium: 132 mmol/L — ABNORMAL LOW (ref 135–145)
Total Bilirubin: 0.5 mg/dL (ref 0.3–1.2)
Total Protein: 6.5 g/dL (ref 6.5–8.1)

## 2019-07-16 MED ORDER — LANREOTIDE ACETATE 120 MG/0.5ML ~~LOC~~ SOLN
120.0000 mg | Freq: Once | SUBCUTANEOUS | Status: AC
  Administered 2019-07-16: 120 mg via SUBCUTANEOUS
  Filled 2019-07-16: qty 120

## 2019-07-16 NOTE — Progress Notes (Signed)
Patient had question about new spot in his lung Per CT scan

## 2019-07-17 LAB — CHROMOGRANIN A: Chromogranin A (ng/mL): 210.9 ng/mL — ABNORMAL HIGH (ref 0.0–101.8)

## 2019-07-31 ENCOUNTER — Other Ambulatory Visit: Payer: Self-pay

## 2019-07-31 ENCOUNTER — Encounter: Payer: Self-pay | Admitting: Pulmonary Disease

## 2019-07-31 ENCOUNTER — Ambulatory Visit: Payer: Medicare Other | Admitting: Pulmonary Disease

## 2019-07-31 VITALS — BP 110/62 | HR 100 | Temp 97.8°F | Ht 64.0 in | Wt 137.2 lb

## 2019-07-31 DIAGNOSIS — C7A8 Other malignant neuroendocrine tumors: Secondary | ICD-10-CM

## 2019-07-31 DIAGNOSIS — E43 Unspecified severe protein-calorie malnutrition: Secondary | ICD-10-CM | POA: Diagnosis not present

## 2019-07-31 DIAGNOSIS — J449 Chronic obstructive pulmonary disease, unspecified: Secondary | ICD-10-CM | POA: Diagnosis not present

## 2019-07-31 DIAGNOSIS — Z8616 Personal history of COVID-19: Secondary | ICD-10-CM

## 2019-07-31 DIAGNOSIS — J9611 Chronic respiratory failure with hypoxia: Secondary | ICD-10-CM | POA: Diagnosis not present

## 2019-07-31 MED ORDER — BUDESONIDE 0.25 MG/2ML IN SUSP: 0.2500 mg | Freq: Two times a day (BID) | RESPIRATORY_TRACT | 2 refills | Status: DC

## 2019-07-31 MED ORDER — PERFOROMIST 20 MCG/2ML IN NEBU: 20.0000 ug | INHALATION_SOLUTION | Freq: Two times a day (BID) | RESPIRATORY_TRACT | 2 refills | Status: DC

## 2019-07-31 NOTE — Patient Instructions (Signed)
You will be getting 2 medications for your nebulizer.  None of these medications are going to affect the issues that you are having with retaining urine.  The first medication is PERFOROMIST (formoterol) you will use this twice a day morning and evening.  You will follow this medication with BUDESONIDE twice a day morning and evening.  It is best not to mix the medications as they tend to precipitate out.  We are getting a chest CT to review the issues noted on your left lung.  We will see you back in 2 to 3 weeks time to reassess how you are doing.

## 2019-07-31 NOTE — Progress Notes (Signed)
Subjective:    Patient ID: Jason Malone, male    DOB: February 15, 1941, 79 y.o.   MRN: [id number]  HPI Patient is a 79 year old former smoker with stage IVa neuroendocrine tumor of the lung metastatic to the liver who presents for evaluation of left mainstem endobronchial mass for potential bronchoscopy with ablation. He is kindly referred by Dr. Delight Hoh. The patient had undergone left upper lobectomy by Dr. Eleanora Neighbor in January 2015. This revealed a neuroendocrine tumor (carcinoid) which was 4.7 cm in diameter. There was endobronchial involvement at the time. The mass was in the hilum and initially it was thought that patient would need pneumonectomy. During surgery it was noted that the tumor encircled the pulmonary artery. The dissection was noted to be "tedious". The initial margins of the bronchial stump were positive for tumor. However, once this was recognized additional resection was done and margins were clear on the final pathology. After resection the patient was presented at the multidisciplinary thoracic oncology conference and no further treatment other than observation was recommended. He was classified as a stage Ib. Patient was noted to have a recurrence of neuroendocrine disease on December 20, 2016 when liver involvement was noted. He was started on lanreotide every 4 weeks.  The patient had been doing relatively well with this regimen until June 10, 2019 when he contracted Covid. He was [facility] to the [facility].  The patient was treated for Covid but remains with significant hypoxemia.  He initially was on 5 L/min nasal cannula O2 but now is down 1-1/2 to 2 l/min.  However, when he exerts himself sometimes has to increase the liter flow to 8 L.  The patient had a CT scan of the chest on 1 February during his admission for Covid.  This showed the typical parenchymal involvement of COVID-19 disease and it also showed left mainstem bronchus mass along the suture line  from the prior resection.  This is likely recurrent carcinoid tumor.  Mass is 2 cm in length.  There is a worrisome finding of thinning along the esophageal margin of the bronchus but there does not appear to be clear esophageal fistulization.  Complicating matters the patient has had issues with urinary retention and is currently dependent on a Foley catheter.  He is being followed by urology in [place].  He was told that the reason for his retention was likely due to albuterol given after his COVID-19 diagnosis.  However, albuterol does not cause urinary retention.  I discussed this issue further with the patient and his wife and apparently he was on DuoNeb which does contain ipratropium which may cause urinary retention.  He has been without any respiratory medication since he discontinued the DuoNeb.  He has noted increasing wheezing and  shortness of breath since he stopped using nebulizers.  Patient has had weight loss that he attributed to taking protein supplements and then not being hungry to eat his regular meals.  Most recent laboratory data shows that he has significant protein calorie malnutrition.  He has been very debilitated and requires assistance for activities of daily living currently.  Presented today in a transport chair.  He has not had any fevers, chills or sweats.  No lower extremity edema.  No orthopnea or paroxysmal nocturnal dyspnea.  Does not endorse chest pain.  Past medical history, surgical history and family history have been reviewed.  Patient is a former smoker smoked 1 pack of cigarettes per day for 30 to 40 years  quit in 2001.  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  He did not serve in the [employer] (father died in action during [place] patient was only surviving male of the family not allowed to join [employer]).  Other than [place] he has also resided in [place] in years  past.    Review of Systems A 10 point review of systems was performed and it is as noted above otherwise negative.    Objective:   Physical Exam BP 110/62 (BP Location: Right Arm, Cuff Size: Normal)   Pulse 100   Temp 97.8 F (36.6 C) (Temporal)   Ht 5\' 4"  (1.626 m)   Wt 137 lb 3.2 oz (62.2 kg)   SpO2 96%   BMI 23.55 kg/m O2 2 L/min. GENERAL: Awake and alert, frail-appearing gentleman, comfortable on nasal cannula O2. HEAD: Normocephalic, atraumatic.  EYES: Pupils equal, round, reactive to light.  No scleral icterus.  MOUTH: Nose/mouth/throat not examined due to masking requirements for COVID 19. NECK: Supple. No thyromegaly. No nodules. No JVD.  Trachea is midline PULMONARY: Diminished breath sounds throughout, faint end expiratory wheezes louder over the left midlung zone.  Left wheezing is monophonic.  No rhonchi. CARDIOVASCULAR: S1 and S2.  Tachycardic rate with regular rhythm.  No rubs murmurs or gallops appreciated. GASTROINTESTINAL: Nondistended, soft. GU: Foley in place draining clear urine. MUSCULOSKELETAL: No joint deformity, no clubbing, no edema.  NEUROLOGIC: Awake and alert, no gross focal deficit.  Speech is fluent. SKIN: Intact,warm,dry.  Skin is somewhat pale.  No rashes noted on limited exam. PSYCH: Mood and behavior normal.   Representative slice of CT performed 11 June 2019, independently reviewed this slice shows the thickening between the bronchial wall and esophageal wall.      Assessment & Plan:   COPD poorly compensated Chronic hypoxic respiratory failure Will initiate Perforomist and budesonide via nebulizer (LABA/ICS) LABA/ICS should have no effect on urinary retention Continue oxygen at 1-1/2 to 2 L/min  Neuroendocrine carcinoma of the lung stage IVa Appears to have recurrence on prior suture line Concern is thinning between bronchial wall and esophageal wall Follow-up chest CT particularly now that he has recovered from COVID-19 Reassess  anatomy for potential bronchoscopy planning We will see him in follow-up in 2 to 3 weeks time  Protein calorie malnutrition, moderate to severe Debility and deconditioning Recommend that he take protein supplements Recommended BOOST glucose control between meals  Personal history of COVID-19 Appears to have resolved without sequela   Thank you for allowing me to participate in this patient's care.  Renold Don, MD [facility]   *This note was dictated using voice recognition software/Dragon.  Despite best efforts to proofread, errors can occur which can change the meaning.  Any change was purely unintentional.

## 2019-08-01 ENCOUNTER — Telehealth: Payer: Self-pay | Admitting: Pulmonary Disease

## 2019-08-01 DIAGNOSIS — J9611 Chronic respiratory failure with hypoxia: Secondary | ICD-10-CM

## 2019-08-01 NOTE — Telephone Encounter (Signed)
I called and spoke with [name] and she states that Jason Malone does not have a nebulizer machine. I advised her that I would place an order for one to be delivered to his home. She verbalized understanding.

## 2019-08-03 ENCOUNTER — Other Ambulatory Visit: Payer: Self-pay

## 2019-08-06 ENCOUNTER — Telehealth: Payer: Self-pay | Admitting: Pulmonary Disease

## 2019-08-06 NOTE — Telephone Encounter (Signed)
Called spoke with patient's wife [name] per [place]. Wife states patient started budesonide BID and perforomist BID on Friday night and started coughing and increased wheezing Saturday afternoon. Wife has also seen feet swelling starting since Saturday and Sunday night.  Patient on 2L oxygen all the time.   Wife believes patient is having a reaction or some kind of side effect from the nebulizer treatments.    Aaron Edelman please advise.

## 2019-08-06 NOTE — Telephone Encounter (Signed)
This is difficult to fully ascertain based off the information provided.  I do not believe that the nebulizers would essentially increase patient's wheezing or coughing.  They are more than welcome to hold the nebulizer treatments for 24 to 48 hours and see if symptoms improve.  If they do not they need to seek emergent evaluation in urgent care or emergency room.  Will route message to Dr. Patsey Berthold as Juluis Rainier.  They need to keep their upcoming appointment with Dr. Patsey Berthold on 08/17/2019.Wyn Quaker, FNP

## 2019-08-06 NOTE — Telephone Encounter (Signed)
Spoke with the pt's spouse and notified of response per Wyn Quaker and she verbalized understanding. Will now forward to Dr Patsey Berthold per Brian's request.

## 2019-08-07 NOTE — Telephone Encounter (Signed)
Patient aware , closing this message. When LG returns to office April 8 if she has any further recommendations we will address them at that time. Nothing further is needed at this time.

## 2019-08-09 NOTE — Progress Notes (Signed)
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  INTERVAL HISTORY: Patient returns to clinic today for further evaluation and continuation of lanreotide.  He continues to have an oxygen requirement, but feels significantly improved over the past several weeks.  He has chronic weakness and fatigue, but these are improving as well.  He continues to have a Foley catheter for urinary retention.  He has no neurologic complaints.  He has a good appetite and denies weight loss.  He denies any chest pain or hemoptysis.  He denies any abdominal pain.  He has no nausea, vomiting, constipation, or diarrhea.  He has no urinary complaints.  Patient offers no further specific complaints today.  REVIEW OF SYSTEMS:   Review of Systems  Constitutional: Positive for malaise/fatigue. Negative for fever and weight loss.  Respiratory: Positive for shortness of breath. Negative for cough and hemoptysis.   Cardiovascular: Negative.  Negative for chest pain and leg swelling.  Gastrointestinal: Negative.  Negative for abdominal pain, diarrhea, nausea and vomiting.  Genitourinary: Negative.  Negative for dysuria.  Musculoskeletal: Negative.  Negative for back pain.  Skin: Negative.  Negative for rash.  Neurological: Positive for weakness. Negative for dizziness, sensory change and focal weakness.    Psychiatric/Behavioral: Negative.  The patient is not nervous/anxious.     As per HPI. Otherwise, a complete review of systems is negative.  PAST MEDICAL HISTORY: Past Medical History:  Diagnosis Date  . Allergic rhinitis   . Benign prostatic hypertrophy with lower urinary tract symptoms (LUTS)   . Chest pain    a. 05/2013 MV: EF 70%, no ischemia.  . Diverticulitis    DIVERTICULOSIS  . Enlarged prostate   . Essential hypertension    CONTROLLED ON MEDS  . Full dentures    upper and lower  . H/O hypokalemia   . Hx of atrial fibrillation without current medication    CARDIOLOGIST-DR [facility]  . 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 Hyperlipemia   . Hypomagnesemia   . IFG (impaired fasting glucose)   . Liver cancer ([place])   . Liver cancer ([place]) 12/08/2016  . Liver mass, left lobe 08/28/2016   Probably hemangioma; will get MRI of liver, refer to GI  . Lung cancer ([place])    a. carcinoid, left lung, Stage 1b (T2a, N0, cM0);  b. 05/2013 s/p VATS & LULobectomy.  . Monocytosis   . Osteopenia   . Paroxysmal atrial flutter ([facility])    a. 03/2013->no recurrence;  b. CHA2DS2VASc = 2-->not currently on anticoagulation;  c. 05/2012 Echo: EF 55-60%, normal RV.  [name] Wears glasses     PAST SURGICAL HISTORY: Past Surgical History:  Procedure Laterality Date  . BRONCHOSCOPY     04/06/2013  . CARDIOVASCULAR STRESS TEST  05/2013   a. No evidence of ischemia or infarct, EF 70%, no WMAs  . CATARACT EXTRACTION W/PHACO Left 07/14/2015   Procedure: CATARACT EXTRACTION PHACO AND INTRAOCULAR LENS PLACEMENT (IOC);  Surgeon:  [name], MD;  Location: [facility];  Service: Ophthalmology;  Laterality: Left;  . CATARACT EXTRACTION W/PHACO Right 10/01/2015   Procedure: CATARACT EXTRACTION PHACO AND INTRAOCULAR LENS PLACEMENT (IOC);  Surgeon: [name], MD;  Location: [place];  Service: Ophthalmology;  Laterality: Right;  . COLONOSCOPY W/  POLYPECTOMY     bleed after-had to go to surgery to stop bleeding via colonoscopy  . COLONOSCOPY WITH PROPOFOL N/A 11/19/2015   Procedure: COLONOSCOPY WITH PROPOFOL;  Surgeon: [name], MD;  [facility] ENDOSCOPY;  Service: Endoscopy;  Laterality: N/A;  . DUPUYTREN CONTRACTURE RELEASE  12/15/2011   Procedure: DUPUYTREN CONTRACTURE RELEASE;  Surgeon: [name], MD;  Location: [place];  Service: Orthopedics;  Laterality: Left;  Fasciotomy left ring finger dupuytrens  . FASCIECTOMY Right 11/07/2018   Procedure: SEGMENTAL FASCIECTOMY RIGHT RING FINGER;  Surgeon: Daryll Brod, MD;  Location: [place];  Service: Orthopedics;  Laterality: Right;  AXILLARY BLOCK  . IR ANGIOGRAM SELECTIVE EACH ADDITIONAL VESSEL  07/26/2017  . IR ANGIOGRAM SELECTIVE EACH ADDITIONAL VESSEL  07/26/2017  . IR ANGIOGRAM SELECTIVE EACH ADDITIONAL VESSEL  07/26/2017  . IR ANGIOGRAM SELECTIVE EACH ADDITIONAL VESSEL  07/26/2017  . IR ANGIOGRAM SELECTIVE EACH ADDITIONAL VESSEL  07/26/2017  . IR ANGIOGRAM SELECTIVE EACH ADDITIONAL VESSEL  08/11/2017  . IR ANGIOGRAM SELECTIVE EACH ADDITIONAL VESSEL  04/28/2018  . IR ANGIOGRAM VISCERAL SELECTIVE  07/26/2017  . IR ANGIOGRAM VISCERAL SELECTIVE  08/11/2017  . IR ANGIOGRAM VISCERAL SELECTIVE  04/28/2018  . IR EMBO ARTERIAL NOT HEMORR HEMANG INC GUIDE ROADMAPPING  07/26/2017  . IR EMBO TUMOR ORGAN ISCHEMIA INFARCT INC GUIDE ROADMAPPING  08/11/2017  . IR EMBO TUMOR ORGAN ISCHEMIA INFARCT INC GUIDE ROADMAPPING  04/28/2018  . IR RADIOLOGIST EVAL & MGMT  07/07/2017  . IR RADIOLOGIST EVAL & MGMT  09/07/2017  . IR RADIOLOGIST EVAL & MGMT  12/21/2017  . IR RADIOLOGIST EVAL & MGMT  03/22/2018  . IR RADIOLOGIST EVAL & MGMT  06/08/2018  . IR RADIOLOGIST EVAL & MGMT  11/01/2018  . IR RADIOLOGIST EVAL & MGMT  03/21/2019  . IR US GUIDE VASC ACCESS RIGHT  07/26/2017  . IR US GUIDE VASC ACCESS RIGHT  08/11/2017  . IR US GUIDE VASC ACCESS RIGHT  04/28/2018  . LUNG  LOBECTOMY  06/10/13   upper left lung  . MULTIPLE TOOTH EXTRACTIONS    . REMOVAL RETAINED LENS Right 11/17/2015   Procedure: REMOVAL RETAINED LENS FROAGMENTS RIGHT EYE;  Surgeon: [name], MD;  Location: [place];  Service: Ophthalmology;  Laterality: Right;  . TONSILLECTOMY    . UPPER GASTROINTESTINAL ENDOSCOPY  11-03-15   Dr [name]  . VASECTOMY    . VIDEO ASSISTED THORACOSCOPY (VATS)/WEDGE RESECTION Left 06/04/2013   Procedure: VIDEO ASSISTED THORACOSCOPY (VATS)/WEDGE RESECTION;  Surgeon: [name], MD;  Location: [facility];  Service: Thoracic;  Laterality: Left;  [name] VIDEO BRONCHOSCOPY N/A 06/04/2013   Procedure: VIDEO BRONCHOSCOPY;  Surgeon: [name], MD;  Location: [facility] OR;  Service: Thoracic;  Laterality: N/A;    FAMILY HISTORY: Family History  Problem Relation Age of Onset  . Stroke Mother   . Alzheimer's disease Mother   . Hypertension Sister   . Hyperlipidemia Sister   . Hypertension Brother   . Hyperlipidemia Brother   . Diabetes Brother     ADVANCED DIRECTIVES (Y/N):  N  HEALTH MAINTENANCE: Social History   Tobacco Use  . Smoking status: Former Smoker  Packs/day: 1.00    Years: 40.00    Pack years: 40.00    Types: Cigarettes    Quit date: 12/09/1998    Years since quitting: 20.6  . Smokeless tobacco: Never Used  Substance Use Topics  . Alcohol use: No  . Drug use: No     Colonoscopy:  PAP:  Bone density:  Lipid panel:  Allergies  Allergen Reactions  . Latex Itching    Tape only- REACTED TO BANDAGE ON ABDOMEN  . Tape Itching    Surgical tapes     Current Outpatient Medications  Medication Sig Dispense Refill  . amLODipine (NORVASC) 2.5 MG tablet Take 1 tablet by mouth once daily (Patient taking differently: Take 2.5 mg by mouth daily. ) 90 tablet 3  . aspirin EC 81 MG tablet Take 81 mg by mouth daily.    [name] atorvastatin (LIPITOR) 40 MG tablet TAKE 1 TABLET BY MOUTH ONCE DAILY FOR CHOLESTEROL (Patient taking  differently: Take 40 mg by mouth daily. ) 90 tablet 1  . Calcium Carbonate-Vitamin D3 (CALCIUM 600/VITAMIN D) 600-400 MG-UNIT TABS Take 1 tablet by mouth daily.     . cholecalciferol (VITAMIN D3) 25 MCG (1000 UNIT) tablet Take 1,000 Units by mouth daily.    . Cinnamon 500 MG capsule Take 500 mg by mouth at bedtime. BEDTIME    . finasteride (PROSCAR) 5 MG tablet Take 1 tablet (5 mg total) by mouth daily. 90 tablet 1  . Glucose Blood (BLOOD GLUCOSE TEST STRIPS) STRP Use as directed to monitor FSBS once daily. Dx: E11.9. 50 strip 1  . guaiFENesin-dextromethorphan (ROBITUSSIN DM) 100-10 MG/5ML syrup Take 10 mLs by mouth every 4 (four) hours as needed for cough. 118 mL 0  . LANREOTIDE ACETATE [place] Inject 1 Dose into the skin every 30 (thirty) days.    [name] losartan (COZAAR) 100 MG tablet Take 1 tablet (100 mg total) by mouth daily. 90 tablet 1  . Multiple Vitamins-Minerals (MULTIVITAMIN WITH MINERALS) tablet Take 1 tablet by mouth daily. AM    . polycarbophil (FIBERCON) 625 MG tablet Take 625 mg by mouth 2 (two) times daily. AM    . tamsulosin (FLOMAX) 0.4 MG CAPS capsule Take 2 capsules (0.8 mg total) by mouth at bedtime. 60 capsule 0  . vitamin C (ASCORBIC ACID) 500 MG tablet Take 500 mg by mouth daily. AM    . zinc sulfate 220 (50 Zn) MG capsule Take 1 capsule (220 mg total) by mouth daily. 30 capsule 0  . budesonide (PULMICORT) 0.25 MG/2ML nebulizer solution Take 2 mLs (0.25 mg total) by nebulization 2 (two) times daily. (Patient not taking: Reported on 08/13/2019) 120 mL 2  . formoterol (PERFOROMIST) 20 MCG/2ML nebulizer solution Take 2 mLs (20 mcg total) by nebulization 2 (two) times daily. (Patient not taking: Reported on 08/13/2019) 120 mL 2   No current facility-administered medications for this visit.   Facility-Administered Medications Ordered in Other Visits  Medication Dose Route Frequency Provider Last Rate Last Admin  . lanreotide acetate (SOMATULINE DEPOT) injection 120 mg  120 mg Subcutaneous  Once Lloyd Huger, MD        OBJECTIVE: Vitals:   08/13/19 0953  BP: (!) 116/57  Pulse: 82  Resp: 20  Temp: (!) 96.3 F (35.7 C)  SpO2: 97%     Body mass index is 24.17 kg/m.    ECOG FS:2 - Symptomatic, <50% confined to bed  General: Well-developed, well-nourished, no acute distress.  Sitting in a wheelchair. Eyes: Pink conjunctiva,  anicteric sclera. HEENT: Normocephalic, moist mucous membranes. Lungs: No audible wheezing or coughing. Heart: Regular rate and rhythm. Abdomen: Soft, nontender, no obvious distention. Musculoskeletal: No edema, cyanosis, or clubbing. Neuro: Alert, answering all questions appropriately. Cranial nerves grossly intact. Skin: No rashes or petechiae noted. Psych: Normal affect.   LAB RESULTS:  Lab Results  Component Value Date   NA 139 08/13/2019   K 3.9 08/13/2019   CL 102 08/13/2019   CO2 29 08/13/2019   GLUCOSE 201 (H) 08/13/2019   BUN 15 08/13/2019   CREATININE 0.78 08/13/2019   CALCIUM 9.2 08/13/2019   PROT 6.8 08/13/2019   ALBUMIN 3.3 (L) 08/13/2019   AST 27 08/13/2019   ALT 27 08/13/2019   ALKPHOS 178 (H) 08/13/2019   BILITOT 0.6 08/13/2019   GFRNONAA >60 08/13/2019   GFRAA >60 08/13/2019    Lab Results  Component Value Date   WBC 8.0 08/13/2019   NEUTROABS 5.9 08/13/2019   HGB 13.6 08/13/2019   HCT 42.1 08/13/2019   MCV 92.9 08/13/2019   PLT 203 08/13/2019     STUDIES: No results found.  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  PLAN:    1. 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 Patient underwent left upper lobe lobectomy in [place] on June 04, 2013. Final pathology results revealed a well differentiated neuroendocrine tumor with positive bronchial margins. Patient did not receive adjuvant XRT or chemotherapy at that time.  Patient's most recent abdominal MRI  on March 15, 2019 reviewed independently with stable to decrease in size of known lesions in patient's liver.  His most recent Y 90 ablation occurred on April 28, 2018.  Patient's chromogranin a levels are elevated, but relatively stable ranging from 213-261.  Proceed with 120 mg subcutaneous lanreotide today.  Return to clinic in 4 weeks for further evaluation and continuation of treatment.   2.  Left mainstem endobronchial mass: Mass is closer to the left upper lobe lobectomy margin, therefore likely a local recurrence of disease given his positive margins after surgery in January 2015.  Patient has follow-up with pulmonology on August 17, 2019 for further evaluation and to schedule possible intervention.  3.  Covid: Resolved.  Patient continues to have an oxygen requirement. 4.  Urinary retention: Foley in place.  Continue follow-up with urology as scheduled.   5.  Elevated liver enzymes: Resolved. 6.  Leukocytosis: Resolved.  Patient expressed understanding and was in agreement with this plan. He also understands that He can call clinic at any time with any questions, concerns, or complaints.   Cancer Staging Neuroendocrine carcinoma of lung [facility]) Staging form: Lung, AJCC 8th Edition - Clinical stage from 12/28/2016: Stage IVA (cT2a, cN0, cM1b) - Signed by Lloyd Huger, MD on 12/28/2016   Lloyd Huger, MD   08/13/2019 1:12 PM

## 2019-08-13 ENCOUNTER — Other Ambulatory Visit: Payer: Self-pay

## 2019-08-13 ENCOUNTER — Encounter: Payer: Self-pay | Admitting: Oncology

## 2019-08-13 ENCOUNTER — Inpatient Hospital Stay (HOSPITAL_BASED_OUTPATIENT_CLINIC_OR_DEPARTMENT_OTHER): Payer: Medicare Other | Admitting: Oncology

## 2019-08-13 ENCOUNTER — Inpatient Hospital Stay: Payer: Medicare Other | Attending: Oncology

## 2019-08-13 ENCOUNTER — Inpatient Hospital Stay: Payer: Medicare Other

## 2019-08-13 VITALS — BP 116/57 | HR 82 | Temp 96.3°F | Resp 20 | Wt 140.8 lb

## 2019-08-13 DIAGNOSIS — Z85118 Personal history of other malignant neoplasm of bronchus and lung: Secondary | ICD-10-CM | POA: Insufficient documentation

## 2019-08-13 DIAGNOSIS — C7A8 Other malignant neuroendocrine tumors: Secondary | ICD-10-CM

## 2019-08-13 DIAGNOSIS — Z79899 Other long term (current) drug therapy: Secondary | ICD-10-CM | POA: Diagnosis not present

## 2019-08-13 DIAGNOSIS — C7B02 Secondary carcinoid tumors of liver: Secondary | ICD-10-CM | POA: Diagnosis present

## 2019-08-13 DIAGNOSIS — C7B8 Other secondary neuroendocrine tumors: Secondary | ICD-10-CM

## 2019-08-13 LAB — CBC WITH DIFFERENTIAL/PLATELET
Abs Immature Granulocytes: 0.03 10*3/uL (ref 0.00–0.07)
Basophils Absolute: 0.1 10*3/uL (ref 0.0–0.1)
Basophils Relative: 1 %
Eosinophils Absolute: 0.2 10*3/uL (ref 0.0–0.5)
Eosinophils Relative: 3 %
HCT: 42.1 % (ref 39.0–52.0)
Hemoglobin: 13.6 g/dL (ref 13.0–17.0)
Immature Granulocytes: 0 %
Lymphocytes Relative: 7 %
Lymphs Abs: 0.5 10*3/uL — ABNORMAL LOW (ref 0.7–4.0)
MCH: 30 pg (ref 26.0–34.0)
MCHC: 32.3 g/dL (ref 30.0–36.0)
MCV: 92.9 fL (ref 80.0–100.0)
Monocytes Absolute: 1.3 10*3/uL — ABNORMAL HIGH (ref 0.1–1.0)
Monocytes Relative: 16 %
Neutro Abs: 5.9 10*3/uL (ref 1.7–7.7)
Neutrophils Relative %: 73 %
Platelets: 203 10*3/uL (ref 150–400)
RBC: 4.53 MIL/uL (ref 4.22–5.81)
RDW: 15.5 % (ref 11.5–15.5)
WBC: 8 10*3/uL (ref 4.0–10.5)
nRBC: 0 % (ref 0.0–0.2)

## 2019-08-13 LAB — COMPREHENSIVE METABOLIC PANEL
ALT: 27 U/L (ref 0–44)
AST: 27 U/L (ref 15–41)
Albumin: 3.3 g/dL — ABNORMAL LOW (ref 3.5–5.0)
Alkaline Phosphatase: 178 U/L — ABNORMAL HIGH (ref 38–126)
Anion gap: 8 (ref 5–15)
BUN: 15 mg/dL (ref 8–23)
CO2: 29 mmol/L (ref 22–32)
Calcium: 9.2 mg/dL (ref 8.9–10.3)
Chloride: 102 mmol/L (ref 98–111)
Creatinine, Ser: 0.78 mg/dL (ref 0.61–1.24)
GFR calc Af Amer: >60 mL/min (ref >60)
GFR calc non Af Amer: >60 mL/min (ref >60)
Glucose, Bld: 201 mg/dL — ABNORMAL HIGH (ref 70–99)
Potassium: 3.9 mmol/L (ref 3.5–5.1)
Sodium: 139 mmol/L (ref 135–145)
Total Bilirubin: 0.6 mg/dL (ref 0.3–1.2)
Total Protein: 6.8 g/dL (ref 6.5–8.1)

## 2019-08-13 MED ORDER — LANREOTIDE ACETATE 120 MG/0.5ML ~~LOC~~ SOLN
120.0000 mg | Freq: Once | SUBCUTANEOUS | Status: AC
  Administered 2019-08-13: 11:00:00 120 mg via SUBCUTANEOUS
  Filled 2019-08-13: qty 120

## 2019-08-13 NOTE — Telephone Encounter (Signed)
Aware, patient is being seen on the ninth.  He is seeing oncology today.  I have already discussed with Dr. Grayland Ormond.

## 2019-08-14 LAB — CHROMOGRANIN A: Chromogranin A (ng/mL): 231.7 ng/mL — ABNORMAL HIGH (ref 0.0–101.8)

## 2019-08-15 ENCOUNTER — Ambulatory Visit
Admission: RE | Admit: 2019-08-15 | Discharge: 2019-08-15 | Disposition: A | Discharge status: Home / Self Care | Payer: Medicare Other | Source: Ambulatory Visit | Attending: Pulmonary Disease | Admitting: Pulmonary Disease

## 2019-08-15 ENCOUNTER — Other Ambulatory Visit: Payer: Self-pay

## 2019-08-15 DIAGNOSIS — J449 Chronic obstructive pulmonary disease, unspecified: Secondary | ICD-10-CM | POA: Diagnosis present

## 2019-08-15 DIAGNOSIS — C7A8 Other malignant neuroendocrine tumors: Secondary | ICD-10-CM | POA: Insufficient documentation

## 2019-08-16 NOTE — Telephone Encounter (Signed)
Noted will sign off

## 2019-08-17 ENCOUNTER — Ambulatory Visit: Payer: Medicare Other | Admitting: Pulmonary Disease

## 2019-08-17 ENCOUNTER — Encounter: Payer: Self-pay | Admitting: Pulmonary Disease

## 2019-08-17 ENCOUNTER — Other Ambulatory Visit: Payer: Self-pay

## 2019-08-17 VITALS — BP 112/62 | HR 81 | Temp 97.7°F | Wt 139.0 lb

## 2019-08-17 DIAGNOSIS — U071 COVID-19: Secondary | ICD-10-CM

## 2019-08-17 DIAGNOSIS — C7A8 Other malignant neuroendocrine tumors: Secondary | ICD-10-CM

## 2019-08-17 DIAGNOSIS — J8410 Pulmonary fibrosis, unspecified: Secondary | ICD-10-CM

## 2019-08-17 DIAGNOSIS — J9611 Chronic respiratory failure with hypoxia: Secondary | ICD-10-CM

## 2019-08-17 DIAGNOSIS — J1282 Pneumonia due to coronavirus disease 2019: Secondary | ICD-10-CM

## 2019-08-17 NOTE — Progress Notes (Signed)
Subjective:    Patient ID: Jason Malone, male    DOB: 1940/07/02, 79 y.o.   MRN: [id number]  HPI Patient is a 79 year old former smoker with stage IVa neuroendocrine tumor of the lung metastatic to the liver who presents for follow-up of left mainstem endobronchial mass for potential bronchoscopy with cryoablation.  He is being followed at the cancer center by Dr. Delight Hoh. The patient had undergone left upper lobectomy by Dr. Eleanora Neighbor in January 2015. This revealed a neuroendocrine tumor (carcinoid) which was 4.7 cm in diameter. There was endobronchial involvement at the time. The mass was in the hilum and initially it was thought that patient would need pneumonectomy. During surgery it was noted that the tumor encircled the pulmonary artery. The dissection was noted to be "tedious". The initial margins of the bronchial stump were positive for tumor. However, once this was recognized additional resection was done and margins were clear on the final pathology. After resection the patient was presented at the multidisciplinary thoracic oncology conference and no further treatment other than observation was recommended. He was classified as a stage Ib. Patient was noted to have a recurrence of neuroendocrine disease on December 20, 2016 when liver involvement was noted. He was started on lanreotide every 4 weeks.  In June 10, 2019 he contracted COVID-19.  He was admitted to the [facility].  He developed acute hypoxic respiratory failure due to COVID-19.  A CT scan performed on 11 June 2019 showed typical parenchymal involvement of COVID-19 on the right lung.  On the left lung he had a lobulated mass in the left mainstem bronchus.  This appeared to be arising from the suture line from the prior resection.  This is likely recurrent carcinoid tumor.  There was also worrisome finding of thinning along the esophageal margin of the bronchus but there appeared to be no clear esophageal  fistulization.  On follow-up CT performed 7 April this potential thinning along the esophageal margin appears to be distinctly separate from the lesion in question.  I suspect that the initial findings were related to inflammatory changes due to COVID-19.  On his initial presentation here 23 March he appeared quite debilitated, and was requiring oxygen at rest but sometimes up to 8 L with activity.  He was placed on Brovana and budesonide however did not tolerate it due to cough.  Suspect that this was due to the [facility] component.  We are avoiding LAMA/SAMAs due to issues with urinary retention.  He is currently still with an indwelling Foley due to this issue.  He is being followed by urology in this regard.  Overall however he is down to 1-1/2 L/min of oxygen.  He has not had to increase his oxygen flow with activity over the last week.  He has not had any fevers, chills or sweats.  No lower extremity edema.  No orthopnea or paroxysmal nocturnal dyspnea.  He does not endorse any chest pain.  He has noted that his appetite has improved 2 pounds of weight since his last visit.  He is more spry and alert during this visit.  He voices no other complaint.  I have reviewed all radiographic studies with the patient and his wife.  Review of Systems A 10 point review of systems was performed and it is as noted above otherwise negative.    Objective:   Physical Exam BP 112/62 (BP Location: Right Arm, Cuff Size: Normal)   Pulse 81   Temp 97.7  F (36.5 C) (Temporal)   Wt 139 lb (63 kg)   SpO2 97%   BMI 23.86 kg/m O2 at 1-1/2 L/min. GENERAL: Awake and alert, thin appearing gentleman, comfortable on nasal cannula O2. HEAD: Normocephalic, atraumatic.  EYES: Pupils equal, round, reactive to light.  No scleral icterus.  MOUTH: Nose/mouth/throat not examined due to masking requirements for COVID 19. NECK: Supple. No thyromegaly. No nodules. No JVD.  Trachea is midline PULMONARY: Clear lung sounds, somewhat  diminished air entry on the left, no monophonic wheeze noted today. CARDIOVASCULAR: S1 and S2.    Regular rate and rhythm.  No rubs murmurs or gallops appreciated. GASTROINTESTINAL: Nondistended, soft. GU: Foley in place draining clear urine. MUSCULOSKELETAL: No joint deformity, no clubbing, no edema.  NEUROLOGIC: Awake and alert, no gross focal deficit.  Speech is fluent. SKIN: Intact,warm,dry.  Skin is somewhat pale.  No rashes noted on limited exam. PSYCH: Mood and behavior normal.  CT scan performed 15 August 2019, independently reviewed, slices as below:        Assessment & Plan:   Neuroendocrine carcinoma of the lung (carcinoid) stage IV Large lobulated endobronchial lesion left mainstem bronchus  Patient is at risk for complete occlusion of the left mainstem bronchus  There is significant postinfectious scarring on the right lung  This would create significant airway compromise for the patient  He has been debilitated proving significantly.  Proposed bronchoscopy with cryoablation (spray cryotherapy)  Tumor destruction/debulking/resection and/or potential stent placement  Tumor appears to be originating from prior suture line  Suspect due to micrometastasis (note that during original surgical case there was positive margin HOWEVER, further resection was not done until margin was clear by frozen section)  Benefits, limitations and potential complications of the procedure were discussed with the patient/family  including, but not limited to bleeding, hemoptysis, respiratory failure requiring intubation and/or prolongued mechanical ventilation, infection, pneumothorax (collapse of lung) requiring chest tube placement, stroke from air embolism or even death  We will reconvene to post procedure in approximately 2 weeks   The patient understands that he will likely need to be admitted overnight for observation due to underlying respiratory failure and also due to the nature of  the neuroendocrine tumor to manage potential carcinoid symptoms post manipulation of the tumor  Postinflammatory pulmonary fibrosis Personal history of COVID-19 pneumonia Budesonide via nebulizer twice daily Will withhold Perforomist  Chronic hypoxic respiratory failure [place]ela of COVID-19 Improving slowly This issue adds complexity to his management  Protein calorie malnutrition Debility and deconditioning Improving with dietary changes   C. Derrill Kay, MD [facility]  Total visit time 45 minutes  *This note was dictated using voice recognition software/Dragon.  Despite best efforts to proofread, errors can occur which can change the meaning.  Any change was purely unintentional.

## 2019-08-17 NOTE — Patient Instructions (Signed)
We will see you in follow-up in 2 to 3 weeks time  In the meantime we are arranging for a procedure to try to debulk/remove your tumor  You will likely need to spend overnight in the hospital for observation  Start taking budesonide nebulizer twice a day this should not irritate you or cause you to cough more  Rinse your mouth well after use of budesonide

## 2019-08-20 ENCOUNTER — Telehealth: Payer: Self-pay | Admitting: Pulmonary Disease

## 2019-08-20 NOTE — Telephone Encounter (Signed)
I called and spoke with the patient and he states that since starting the Budesonide back he has been coughing constantly and its keeping him up at night. He also states that he gets short of breath quicker than he did before since starting back on it.   Dr. Patsey Berthold please advise on how you would like to proceed.

## 2019-08-20 NOTE — Telephone Encounter (Signed)
Quite perplexing.  He may discontinue budesonide.

## 2019-08-21 NOTE — Telephone Encounter (Signed)
I called and spoke with the patient and made him aware that Dr. Patsey Berthold said it was ok for him to  stop the Budesonide. He verbalized understanding. He states that the cough has subsided some since he stopped.

## 2019-08-29 ENCOUNTER — Other Ambulatory Visit: Payer: Self-pay | Admitting: Interventional Radiology

## 2019-08-29 DIAGNOSIS — C7A8 Other malignant neuroendocrine tumors: Secondary | ICD-10-CM

## 2019-08-29 DIAGNOSIS — C7B8 Other secondary neuroendocrine tumors: Secondary | ICD-10-CM

## 2019-09-07 ENCOUNTER — Telehealth: Payer: Self-pay | Admitting: Family Medicine

## 2019-09-07 NOTE — Telephone Encounter (Signed)
Attempted to schedule AWV. Unable to LVM.  Will try at later time. No voicemail. 

## 2019-09-08 NOTE — Progress Notes (Signed)
[place]  [phone] [phone]  ID: Jason Malone OB: 1940-05-16  MR#: [id number]  QMG#:[id number]  Patient Care Team: Delsa Grana, PA-C as PCP - General (Family Medicine) [name], Forest Gleason, MD (General Surgery) Nestor Lewandowsky, MD as Referring Physician (Cardiothoracic Surgery) Minna Merritts, MD as Consulting Physician (Cardiology) [name], MD as Consulting Physician (Cardiothoracic Surgery) Lloyd Huger, MD as Consulting Physician (Oncology) Sanda Klein Satira Anis, MD as Attending Physician (Family Medicine)   CHIEF COMPLAINT: 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  INTERVAL HISTORY: Patient returns to clinic today for further evaluation and continuation of lanreotide.  He continues to have an oxygen requirement, but only during activity and at night.  He continues to have chronic weakness and fatigue.  His Foley catheter has been removed. He has no neurologic complaints.  He has a good appetite and denies weight loss.  He denies any chest pain, cough, or hemoptysis.  He denies any abdominal pain.  He has no nausea, vomiting, constipation, or diarrhea.  He has no urinary complaints.  Patient offers no further specific complaints today.  REVIEW OF SYSTEMS:   Review of Systems  Constitutional: Positive for malaise/fatigue. Negative for fever and weight loss.  Respiratory: Positive for shortness of breath. Negative for cough and hemoptysis.   Cardiovascular: Negative.  Negative for chest pain and leg swelling.  Gastrointestinal: Negative.  Negative for abdominal pain, diarrhea, nausea and vomiting.  Genitourinary: Negative.  Negative for dysuria.  Musculoskeletal: Negative.  Negative for back pain.  Skin: Negative.  Negative for rash.  Neurological: Positive for weakness. Negative for dizziness, sensory change and focal weakness.  Psychiatric/Behavioral: Negative.  The patient is not nervous/anxious.       As per HPI. Otherwise, a complete review of systems is negative.  PAST MEDICAL HISTORY: Past Medical History:  Diagnosis Date  . Allergic rhinitis   . Benign prostatic hypertrophy with lower urinary tract symptoms (LUTS)   . Chest pain    a. 05/2013 MV: EF 70%, no ischemia.  . Diverticulitis    DIVERTICULOSIS  . Enlarged prostate   . Essential hypertension    CONTROLLED ON MEDS  . Full dentures    upper and lower  . H/O hypokalemia   . Hx of atrial fibrillation without current medication    CARDIOLOGIST-DR [facility]  . 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 Hyperlipemia   . Hypomagnesemia   . IFG (impaired fasting glucose)   . Liver cancer ([place])   . Liver cancer ([place]) 12/08/2016  . Liver mass, left lobe 08/28/2016   Probably hemangioma; will get MRI of liver, refer to GI  . Lung cancer ([place])    a. carcinoid, left lung, Stage 1b (T2a, N0, cM0);  b. 05/2013 s/p VATS & LULobectomy.  . Monocytosis   . Osteopenia   . Paroxysmal atrial flutter ([facility])    a. 03/2013->no recurrence;  b. CHA2DS2VASc = 2-->not currently on anticoagulation;  c. 05/2012 Echo: EF 55-60%, normal RV.  [name] Wears glasses     PAST SURGICAL HISTORY: Past Surgical History:  Procedure Laterality Date  . BRONCHOSCOPY     04/06/2013  . CARDIOVASCULAR STRESS TEST  05/2013   a. No evidence of ischemia or infarct, EF 70%, no WMAs  . CATARACT EXTRACTION W/PHACO Left 07/14/2015   Procedure: CATARACT EXTRACTION PHACO AND INTRAOCULAR LENS PLACEMENT (IOC);  Surgeon: [name], MD;  Location: [place];  Service:  Ophthalmology;  Laterality: Left;  . CATARACT EXTRACTION W/PHACO Right 10/01/2015   Procedure: CATARACT EXTRACTION PHACO AND INTRAOCULAR LENS PLACEMENT (IOC);  Surgeon: [name], MD;  Location: [place];  Service: Ophthalmology;  Laterality: Right;  . COLONOSCOPY W/ POLYPECTOMY     bleed after-had to go to surgery to stop bleeding via  colonoscopy  . COLONOSCOPY WITH PROPOFOL N/A 11/19/2015   Procedure: COLONOSCOPY WITH PROPOFOL;  Surgeon: [name], MD;  Location: [facility] ENDOSCOPY;  Service: Endoscopy;  Laterality: N/A;  . DUPUYTREN CONTRACTURE RELEASE  12/15/2011   Procedure: DUPUYTREN CONTRACTURE RELEASE;  Surgeon: [name], MD;  Location: [place];  Service: Orthopedics;  Laterality: Left;  Fasciotomy left ring finger dupuytrens  . FASCIECTOMY Right 11/07/2018   Procedure: SEGMENTAL FASCIECTOMY RIGHT RING FINGER;  Surgeon: Daryll Brod, MD;  Location: [place];  Service: Orthopedics;  Laterality: Right;  AXILLARY BLOCK  . IR ANGIOGRAM SELECTIVE EACH ADDITIONAL VESSEL  07/26/2017  . IR ANGIOGRAM SELECTIVE EACH ADDITIONAL VESSEL  07/26/2017  . IR ANGIOGRAM SELECTIVE EACH ADDITIONAL VESSEL  07/26/2017  . IR ANGIOGRAM SELECTIVE EACH ADDITIONAL VESSEL  07/26/2017  . IR ANGIOGRAM SELECTIVE EACH ADDITIONAL VESSEL  07/26/2017  . IR ANGIOGRAM SELECTIVE EACH ADDITIONAL VESSEL  08/11/2017  . IR ANGIOGRAM SELECTIVE EACH ADDITIONAL VESSEL  04/28/2018  . IR ANGIOGRAM VISCERAL SELECTIVE  07/26/2017  . IR ANGIOGRAM VISCERAL SELECTIVE  08/11/2017  . IR ANGIOGRAM VISCERAL SELECTIVE  04/28/2018  . IR EMBO ARTERIAL NOT HEMORR HEMANG INC GUIDE ROADMAPPING  07/26/2017  . IR EMBO TUMOR ORGAN ISCHEMIA INFARCT INC GUIDE ROADMAPPING  08/11/2017  . IR EMBO TUMOR ORGAN ISCHEMIA INFARCT INC GUIDE ROADMAPPING  04/28/2018  . IR RADIOLOGIST EVAL & MGMT  07/07/2017  . IR RADIOLOGIST EVAL & MGMT  09/07/2017  . IR RADIOLOGIST EVAL & MGMT  12/21/2017  . IR RADIOLOGIST EVAL & MGMT  03/22/2018  . IR RADIOLOGIST EVAL & MGMT  06/08/2018  . IR RADIOLOGIST EVAL & MGMT  11/01/2018  . IR RADIOLOGIST EVAL & MGMT  03/21/2019  . IR US GUIDE VASC ACCESS RIGHT  07/26/2017  . IR US GUIDE VASC ACCESS RIGHT  08/11/2017  . IR US GUIDE VASC ACCESS RIGHT  04/28/2018  . LUNG LOBECTOMY  06/10/13   upper left lung  . MULTIPLE TOOTH EXTRACTIONS    .  REMOVAL RETAINED LENS Right 11/17/2015   Procedure: REMOVAL RETAINED LENS FROAGMENTS RIGHT EYE;  Surgeon: [name], MD;  Location: [place];  Service: Ophthalmology;  Laterality: Right;  . TONSILLECTOMY    . UPPER GASTROINTESTINAL ENDOSCOPY  11-03-15   Dr [name]  . VASECTOMY    . VIDEO ASSISTED THORACOSCOPY (VATS)/WEDGE RESECTION Left 06/04/2013   Procedure: VIDEO ASSISTED THORACOSCOPY (VATS)/WEDGE RESECTION;  Surgeon: [name], MD;  Location: [place];  Service: Thoracic;  Laterality: Left;  [name] VIDEO BRONCHOSCOPY N/A 06/04/2013   Procedure: VIDEO BRONCHOSCOPY;  Surgeon: [name], MD;  Location[facility] OR;  Service: Thoracic;  Laterality: N/A;    FAMILY HISTORY: Family History  Problem Relation Age of Onset  . Stroke Mother   . Alzheimer's disease Mother   . Hypertension Sister   . Hyperlipidemia Sister   . Hypertension Brother   . Hyperlipidemia Brother   . Diabetes Brother     ADVANCED DIRECTIVES (Y/N):  N  HEALTH MAINTENANCE: Social History   Tobacco Use  . Smoking status: Former Smoker    Packs/day: 1.00    Years: 40.00  Pack years: 40.00    Types: Cigarettes    Quit date: 12/09/1998    Years since quitting: 20.7  . Smokeless tobacco: Never Used  Substance Use Topics  . Alcohol use: No  . Drug use: No     Colonoscopy:  PAP:  Bone density:  Lipid panel:  Allergies  Allergen Reactions  . Latex Itching    Tape only- REACTED TO BANDAGE ON ABDOMEN  . Tape Itching    Surgical tapes     Current Outpatient Medications  Medication Sig Dispense Refill  . amLODipine (NORVASC) 2.5 MG tablet Take 1 tablet by mouth once daily (Patient taking differently: Take 2.5 mg by mouth daily. ) 90 tablet 3  . aspirin EC 81 MG tablet Take 81 mg by mouth daily.    [name] atorvastatin (LIPITOR) 40 MG tablet TAKE 1 TABLET BY MOUTH ONCE DAILY FOR CHOLESTEROL (Patient taking differently: Take 40 mg by mouth daily. ) 90 tablet 1  . Calcium Carbonate-Vitamin  D3 (CALCIUM 600/VITAMIN D) 600-400 MG-UNIT TABS Take 1 tablet by mouth daily.     . cholecalciferol (VITAMIN D3) 25 MCG (1000 UNIT) tablet Take 1,000 Units by mouth daily.    . Cinnamon 500 MG capsule Take 500 mg by mouth at bedtime. BEDTIME    . ciprofloxacin (CIPRO) 500 MG tablet Take 500 mg by mouth 2 (two) times daily.    . finasteride (PROSCAR) 5 MG tablet Take 1 tablet (5 mg total) by mouth daily. 90 tablet 1  . Glucose Blood (BLOOD GLUCOSE TEST STRIPS) STRP Use as directed to monitor FSBS once daily. Dx: E11.9. 50 strip 1  . guaiFENesin-dextromethorphan (ROBITUSSIN DM) 100-10 MG/5ML syrup Take 10 mLs by mouth every 4 (four) hours as needed for cough. 118 mL 0  . LANREOTIDE ACETATE Frederic Inject 1 Dose into the skin every 30 (thirty) days.    [name] losartan (COZAAR) 100 MG tablet Take 1 tablet (100 mg total) by mouth daily. 90 tablet 1  . Multiple Vitamins-Minerals (MULTIVITAMIN WITH MINERALS) tablet Take 1 tablet by mouth daily. AM    . polycarbophil (FIBERCON) 625 MG tablet Take 625 mg by mouth 2 (two) times daily. AM    . tamsulosin (FLOMAX) 0.4 MG CAPS capsule Take 2 capsules (0.8 mg total) by mouth at bedtime. 60 capsule 0  . vitamin C (ASCORBIC ACID) 500 MG tablet Take 500 mg by mouth daily. AM    . zinc sulfate 220 (50 Zn) MG capsule Take 1 capsule (220 mg total) by mouth daily. 30 capsule 0   No current facility-administered medications for this visit.   Facility-Administered Medications Ordered in Other Visits  Medication Dose Route Frequency Provider Last Rate Last Admin  . lanreotide acetate (SOMATULINE DEPOT) injection 120 mg  120 mg Subcutaneous Once Lloyd Huger, MD        OBJECTIVE: Vitals:   09/10/19 1049  BP: 140/76  Pulse: 70  Resp: 20  Temp: (!) 94.2 F (34.6 C)  SpO2: 97%     Body mass index is 24.13 kg/m.    ECOG FS:2 - Symptomatic, <50% confined to bed  General: Well-developed, well-nourished, no acute distress.  Sitting in a wheelchair. Eyes: Pink  conjunctiva, anicteric sclera. HEENT: Normocephalic, moist mucous membranes. Lungs: No audible wheezing or coughing. Heart: Regular rate and rhythm. Abdomen: Soft, nontender, no obvious distention. Musculoskeletal: No edema, cyanosis, or clubbing. Neuro: Alert, answering all questions appropriately. Cranial nerves grossly intact. Skin: No rashes or petechiae noted. Psych: Normal affect.  LAB RESULTS:  Lab Results  Component Value Date   NA 140 09/10/2019   K 5.0 09/10/2019   CL 103 09/10/2019   CO2 30 09/10/2019   GLUCOSE 171 (H) 09/10/2019   BUN 17 09/10/2019   CREATININE 0.84 09/10/2019   CALCIUM 9.6 09/10/2019   PROT 7.0 09/10/2019   ALBUMIN 4.0 09/10/2019   AST 21 09/10/2019   ALT 16 09/10/2019   ALKPHOS 116 09/10/2019   BILITOT 0.9 09/10/2019   GFRNONAA >60 09/10/2019   GFRAA >60 09/10/2019    Lab Results  Component Value Date   WBC 8.9 09/10/2019   NEUTROABS 7.0 09/10/2019   HGB 15.0 09/10/2019   HCT 46.8 09/10/2019   MCV 93.4 09/10/2019   PLT 196 09/10/2019     STUDIES: CT CHEST WO CONTRAST  Result Date: 08/15/2019 CLINICAL DATA:  79 year old male with recent COVID-19 presenting with shortness of breath. COPD. EXAM: CT CHEST WITHOUT CONTRAST TECHNIQUE: Multidetector CT imaging of the chest was performed following the standard protocol without IV contrast. COMPARISON:  Chest CT dated 06/11/2019. FINDINGS: Evaluation of this exam is limited in the absence of intravenous contrast. Cardiovascular: There is no cardiomegaly or pericardial effusion. Three-vessel coronary vascular calcification. There is moderate atherosclerotic calcification of the thoracic aorta. The pulmonary arteries are grossly unremarkable on this noncontrast CT. Mediastinum/Nodes: No hilar or mediastinal adenopathy. The esophagus is grossly unremarkable. No mediastinal fluid collection. Lungs/Pleura: Postsurgical changes of left upper lobectomy. Diffuse linear and streaky densities throughout the  right lung likely post infection scarring. Focal patchy ground-glass density in the right lower lobe may may be sequela of prior infection although recurrent pneumonia is not excluded. Clinical correlation is recommended. There is background of emphysema. There is no pleural effusion or pneumothorax. A bilobed or 2 adjacent nodular density in the left mainstem bronchus with combined length of 2 cm (66/2) as seen previously concerning for an endobronchial lesion. This can be better evaluated with bronchoscopy. Upper Abdomen: Cirrhosis. Gallstone. Thickened gallbladder wall with pericholecystic stranding may be related to cirrhosis. If there is clinical concern for acute cholecystitis further evaluation with ultrasound is recommended. Musculoskeletal: Degenerative changes of the spine. No acute osseous pathology. IMPRESSION: 1. Diffuse right lung scarring, likely post infectious/inflammatory. Patchy ground-glass density in the right lower lobe may be sequela prior infection or represent recurrent infection. Clinical correlation is recommended. 2. Postsurgical changes of left upper lobectomy. Bilobed lesion in the left mainstem bronchus as seen on the prior CT. Bronchoscopy may provide better evaluation. 3. Cirrhosis. 4. Cholelithiasis. Thickened gallbladder wall with pericholecystic stranding may be related to cirrhosis. If there is clinical concern for acute cholecystitis further evaluation with ultrasound is recommended. 5. Aortic Atherosclerosis (ICD10-I70.0) and Emphysema (ICD10-J43.9). Electronically Signed   By: Anner Crete M.D.   On: 08/15/2019 20:02    ASSESSMENT: 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  PLAN:    1. 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 Patient underwent left upper lobe lobectomy in [place] on June 04, 2013. Final pathology results revealed  a well differentiated neuroendocrine tumor with positive bronchial margins. Patient did not receive adjuvant XRT or chemotherapy at that time.  Patient's most recent abdominal MRI on March 15, 2019 reviewed independently with stable to decrease in size of known lesions in patient's liver.  His most recent Y 90 ablation occurred on April 28, 2018.  Patient's chromogranin a levels are elevated,  but relatively stable ranging from 213-261.  Proceed with 120 mg subcutaneous lanreotide today.  Return to clinic in 4 weeks for further evaluation and continuation of treatment.   2.  Left mainstem endobronchial mass: Mass is closer to the left upper lobe lobectomy margin, therefore likely a local recurrence of disease given his positive margins after surgery in January 2015.  Patient has follow-up with pulmonology on Sep 12, 2019 for further evaluation and to set a date for possible ablation. 3.  Covid: Resolved.  Patient continues to have an oxygen requirement as well as lung scarring as indicated on CT scan on August 15, 2019. 4.  Urinary retention: Foley has been removed.  Patient reports he has follow-up with urology later this week.   Patient expressed understanding and was in agreement with this plan. He also understands that He can call clinic at any time with any questions, concerns, or complaints.   Cancer Staging Neuroendocrine carcinoma of lung [facility]) Staging form: Lung, AJCC 8th Edition - Clinical stage from 12/28/2016: Stage IVA (cT2a, cN0, cM1b) - Signed by Lloyd Huger, MD on 12/28/2016   Lloyd Huger, MD   09/10/2019 12:39 PM

## 2019-09-08 NOTE — H&P (View-Only) (Signed)
[name]  [phone] [phone]  ID: Jason Malone OB: November 23, 1940  MR#: [id number]  MGQ#:[id number]  Patient Care Team: Delsa Grana, PA-C as PCP - General (Family Medicine) [name], Forest Gleason, MD (General Surgery) Nestor Lewandowsky, MD as Referring Physician (Cardiothoracic Surgery) Minna Merritts, MD as Consulting Physician (Cardiology) [name], MD as Consulting Physician (Cardiothoracic Surgery) Lloyd Huger, MD as Consulting Physician (Oncology) Sanda Klein Satira Anis, MD as Attending Physician (Family Medicine)   CHIEF COMPLAINT: 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  INTERVAL HISTORY: Patient returns to clinic today for further evaluation and continuation of lanreotide.  He continues to have an oxygen requirement, but only during activity and at night.  He continues to have chronic weakness and fatigue.  His Foley catheter has been removed. He has no neurologic complaints.  He has a good appetite and denies weight loss.  He denies any chest pain, cough, or hemoptysis.  He denies any abdominal pain.  He has no nausea, vomiting, constipation, or diarrhea.  He has no urinary complaints.  Patient offers no further specific complaints today.  REVIEW OF SYSTEMS:   Review of Systems  Constitutional: Positive for malaise/fatigue. Negative for fever and weight loss.  Respiratory: Positive for shortness of breath. Negative for cough and hemoptysis.   Cardiovascular: Negative.  Negative for chest pain and leg swelling.  Gastrointestinal: Negative.  Negative for abdominal pain, diarrhea, nausea and vomiting.  Genitourinary: Negative.  Negative for dysuria.  Musculoskeletal: Negative.  Negative for back pain.  Skin: Negative.  Negative for rash.  Neurological: Positive for weakness. Negative for dizziness, sensory change and focal weakness.  Psychiatric/Behavioral: Negative.  The patient is not nervous/anxious.      As per HPI. Otherwise, a complete review of systems is negative.  PAST MEDICAL HISTORY: Past Medical History:  Diagnosis Date  . Allergic rhinitis   . Benign prostatic hypertrophy with lower urinary tract symptoms (LUTS)   . Chest pain    a. 05/2013 MV: EF 70%, no ischemia.  . Diverticulitis    DIVERTICULOSIS  . Enlarged prostate   . Essential hypertension    CONTROLLED ON MEDS  . Full dentures    upper and lower  . H/O hypokalemia   . Hx of atrial fibrillation without current medication    CARDIOLOGIST-DR [facility]  . 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 Hyperlipemia   . Hypomagnesemia   . IFG (impaired fasting glucose)   . Liver cancer ([place])   . Liver cancer ([place]) 12/08/2016  . Liver mass, left lobe 08/28/2016   Probably hemangioma; will get MRI of liver, refer to GI  . Lung cancer ([place])    a. carcinoid, left lung, Stage 1b (T2a, N0, cM0);  b. 05/2013 s/p VATS & LULobectomy.  . Monocytosis   . Osteopenia   . Paroxysmal atrial flutter ([facility])    a. 03/2013->no recurrence;  b. CHA2DS2VASc = 2-->not currently on anticoagulation;  c. 05/2012 Echo: EF 55-60%, normal RV.  [name] Wears glasses     PAST SURGICAL HISTORY: Past Surgical History:  Procedure Laterality Date  . BRONCHOSCOPY     04/06/2013  . CARDIOVASCULAR STRESS TEST  05/2013   a. No evidence of ischemia or infarct, EF 70%, no WMAs  . CATARACT EXTRACTION W/PHACO Left 07/14/2015   Procedure: CATARACT EXTRACTION PHACO AND INTRAOCULAR LENS PLACEMENT (IOC);  Surgeon: [name], MD;  Location: [place];  Service: Ophthalmology;  Laterality: Left;  . CATARACT EXTRACTION W/PHACO Right 10/01/2015   Procedure: CATARACT EXTRACTION PHACO AND INTRAOCULAR LENS PLACEMENT (IOC);  Surgeon: [name], MD;  Location: [place];  Service: Ophthalmology;  Laterality: Right;  . COLONOSCOPY W/ POLYPECTOMY     bleed after-had to go to surgery to stop bleeding via  colonoscopy  . COLONOSCOPY WITH PROPOFOL N/A 11/19/2015   Procedure: COLONOSCOPY WITH PROPOFOL;  Surgeon: [name], MD;  Location: [facility] ENDOSCOPY;  Service: Endoscopy;  Laterality: N/A;  . DUPUYTREN CONTRACTURE RELEASE  12/15/2011   Procedure: DUPUYTREN CONTRACTURE RELEASE;  Surgeon: [name], MD;  Location: [place];  Service: Orthopedics;  Laterality: Left;  Fasciotomy left ring finger dupuytrens  . FASCIECTOMY Right 11/07/2018   Procedure: SEGMENTAL FASCIECTOMY RIGHT RING FINGER;  Surgeon: Daryll Brod, MD;  Location: [place];  Service: Orthopedics;  Laterality: Right;  AXILLARY BLOCK  . IR ANGIOGRAM SELECTIVE EACH ADDITIONAL VESSEL  07/26/2017  . IR ANGIOGRAM SELECTIVE EACH ADDITIONAL VESSEL  07/26/2017  . IR ANGIOGRAM SELECTIVE EACH ADDITIONAL VESSEL  07/26/2017  . IR ANGIOGRAM SELECTIVE EACH ADDITIONAL VESSEL  07/26/2017  . IR ANGIOGRAM SELECTIVE EACH ADDITIONAL VESSEL  07/26/2017  . IR ANGIOGRAM SELECTIVE EACH ADDITIONAL VESSEL  08/11/2017  . IR ANGIOGRAM SELECTIVE EACH ADDITIONAL VESSEL  04/28/2018  . IR ANGIOGRAM VISCERAL SELECTIVE  07/26/2017  . IR ANGIOGRAM VISCERAL SELECTIVE  08/11/2017  . IR ANGIOGRAM VISCERAL SELECTIVE  04/28/2018  . IR EMBO ARTERIAL NOT HEMORR HEMANG INC GUIDE ROADMAPPING  07/26/2017  . IR EMBO TUMOR ORGAN ISCHEMIA INFARCT INC GUIDE ROADMAPPING  08/11/2017  . IR EMBO TUMOR ORGAN ISCHEMIA INFARCT INC GUIDE ROADMAPPING  04/28/2018  . IR RADIOLOGIST EVAL & MGMT  07/07/2017  . IR RADIOLOGIST EVAL & MGMT  09/07/2017  . IR RADIOLOGIST EVAL & MGMT  12/21/2017  . IR RADIOLOGIST EVAL & MGMT  03/22/2018  . IR RADIOLOGIST EVAL & MGMT  06/08/2018  . IR RADIOLOGIST EVAL & MGMT  11/01/2018  . IR RADIOLOGIST EVAL & MGMT  03/21/2019  . IR US GUIDE VASC ACCESS RIGHT  07/26/2017  . IR US GUIDE VASC ACCESS RIGHT  08/11/2017  . IR US GUIDE VASC ACCESS RIGHT  04/28/2018  . LUNG LOBECTOMY  06/10/13   upper left lung  . MULTIPLE TOOTH EXTRACTIONS    .  REMOVAL RETAINED LENS Right 11/17/2015   Procedure: REMOVAL RETAINED LENS FROAGMENTS RIGHT EYE;  Surgeon: [name], MD;  Location: [place];  Service: Ophthalmology;  Laterality: Right;  . TONSILLECTOMY    . UPPER GASTROINTESTINAL ENDOSCOPY  11-03-15   Dr [name]  . VASECTOMY    . VIDEO ASSISTED THORACOSCOPY (VATS)/WEDGE RESECTION Left 06/04/2013   Procedure: VIDEO ASSISTED THORACOSCOPY (VATS)/WEDGE RESECTION;  Surgeon: [name], MD;  Location: [place];  Service: Thoracic;  Laterality: Left;  [name] VIDEO BRONCHOSCOPY N/A 06/04/2013   Procedure: VIDEO BRONCHOSCOPY;  Surgeon: [name], MD;  [facility];  Service: Thoracic;  Laterality: N/A;    FAMILY HISTORY: Family History  Problem Relation Age of Onset  . Stroke Mother   . Alzheimer's disease Mother   . Hypertension Sister   . Hyperlipidemia Sister   . Hypertension Brother   . Hyperlipidemia Brother   . Diabetes Brother     ADVANCED DIRECTIVES (Y/N):  N  HEALTH MAINTENANCE: Social History   Tobacco Use  . Smoking status: Former Smoker    Packs/day: 1.00    Years: 40.00    Pack  years: 40.00    Types: Cigarettes    Quit date: 12/09/1998    Years since quitting: 20.7  . Smokeless tobacco: Never Used  Substance Use Topics  . Alcohol use: No  . Drug use: No     Colonoscopy:  PAP:  Bone density:  Lipid panel:  Allergies  Allergen Reactions  . Latex Itching    Tape only- REACTED TO BANDAGE ON ABDOMEN  . Tape Itching    Surgical tapes     Current Outpatient Medications  Medication Sig Dispense Refill  . amLODipine (NORVASC) 2.5 MG tablet Take 1 tablet by mouth once daily (Patient taking differently: Take 2.5 mg by mouth daily. ) 90 tablet 3  . aspirin EC 81 MG tablet Take 81 mg by mouth daily.    [name] atorvastatin (LIPITOR) 40 MG tablet TAKE 1 TABLET BY MOUTH ONCE DAILY FOR CHOLESTEROL (Patient taking differently: Take 40 mg by mouth daily. ) 90 tablet 1  . Calcium Carbonate-Vitamin  D3 (CALCIUM 600/VITAMIN D) 600-400 MG-UNIT TABS Take 1 tablet by mouth daily.     . cholecalciferol (VITAMIN D3) 25 MCG (1000 UNIT) tablet Take 1,000 Units by mouth daily.    . Cinnamon 500 MG capsule Take 500 mg by mouth at bedtime. BEDTIME    . ciprofloxacin (CIPRO) 500 MG tablet Take 500 mg by mouth 2 (two) times daily.    . finasteride (PROSCAR) 5 MG tablet Take 1 tablet (5 mg total) by mouth daily. 90 tablet 1  . Glucose Blood (BLOOD GLUCOSE TEST STRIPS) STRP Use as directed to monitor FSBS once daily. Dx: E11.9. 50 strip 1  . guaiFENesin-dextromethorphan (ROBITUSSIN DM) 100-10 MG/5ML syrup Take 10 mLs by mouth every 4 (four) hours as needed for cough. 118 mL 0  . LANREOTIDE ACETATE [place] Inject 1 Dose into the skin every 30 (thirty) days.    [name] losartan (COZAAR) 100 MG tablet Take 1 tablet (100 mg total) by mouth daily. 90 tablet 1  . Multiple Vitamins-Minerals (MULTIVITAMIN WITH MINERALS) tablet Take 1 tablet by mouth daily. AM    . polycarbophil (FIBERCON) 625 MG tablet Take 625 mg by mouth 2 (two) times daily. AM    . tamsulosin (FLOMAX) 0.4 MG CAPS capsule Take 2 capsules (0.8 mg total) by mouth at bedtime. 60 capsule 0  . vitamin C (ASCORBIC ACID) 500 MG tablet Take 500 mg by mouth daily. AM    . zinc sulfate 220 (50 Zn) MG capsule Take 1 capsule (220 mg total) by mouth daily. 30 capsule 0   No current facility-administered medications for this visit.   Facility-Administered Medications Ordered in Other Visits  Medication Dose Route Frequency Provider Last Rate Last Admin  . lanreotide acetate (SOMATULINE DEPOT) injection 120 mg  120 mg Subcutaneous Once Lloyd Huger, MD        OBJECTIVE: Vitals:   09/10/19 1049  BP: 140/76  Pulse: 70  Resp: 20  Temp: (!) 94.2 F (34.6 C)  SpO2: 97%     Body mass index is 24.13 kg/m.    ECOG FS:2 - Symptomatic, <50% confined to bed  General: Well-developed, well-nourished, no acute distress.  Sitting in a wheelchair. Eyes: Pink  conjunctiva, anicteric sclera. HEENT: Normocephalic, moist mucous membranes. Lungs: No audible wheezing or coughing. Heart: Regular rate and rhythm. Abdomen: Soft, nontender, no obvious distention. Musculoskeletal: No edema, cyanosis, or clubbing. Neuro: Alert, answering all questions appropriately. Cranial nerves grossly intact. Skin: No rashes or petechiae noted. Psych: Normal affect.   LAB  RESULTS:  Lab Results  Component Value Date   NA 140 09/10/2019   K 5.0 09/10/2019   CL 103 09/10/2019   CO2 30 09/10/2019   GLUCOSE 171 (H) 09/10/2019   BUN 17 09/10/2019   CREATININE 0.84 09/10/2019   CALCIUM 9.6 09/10/2019   PROT 7.0 09/10/2019   ALBUMIN 4.0 09/10/2019   AST 21 09/10/2019   ALT 16 09/10/2019   ALKPHOS 116 09/10/2019   BILITOT 0.9 09/10/2019   GFRNONAA >60 09/10/2019   GFRAA >60 09/10/2019    Lab Results  Component Value Date   WBC 8.9 09/10/2019   NEUTROABS 7.0 09/10/2019   HGB 15.0 09/10/2019   HCT 46.8 09/10/2019   MCV 93.4 09/10/2019   PLT 196 09/10/2019     STUDIES: CT CHEST WO CONTRAST  Result Date: 08/15/2019 CLINICAL DATA:  79 year old male with recent COVID-19 presenting with shortness of breath. COPD. EXAM: CT CHEST WITHOUT CONTRAST TECHNIQUE: Multidetector CT imaging of the chest was performed following the standard protocol without IV contrast. COMPARISON:  Chest CT dated 06/11/2019. FINDINGS: Evaluation of this exam is limited in the absence of intravenous contrast. Cardiovascular: There is no cardiomegaly or pericardial effusion. Three-vessel coronary vascular calcification. There is moderate atherosclerotic calcification of the thoracic aorta. The pulmonary arteries are grossly unremarkable on this noncontrast CT. Mediastinum/Nodes: No hilar or mediastinal adenopathy. The esophagus is grossly unremarkable. No mediastinal fluid collection. Lungs/Pleura: Postsurgical changes of left upper lobectomy. Diffuse linear and streaky densities throughout the  right lung likely post infection scarring. Focal patchy ground-glass density in the right lower lobe may may be sequela of prior infection although recurrent pneumonia is not excluded. Clinical correlation is recommended. There is background of emphysema. There is no pleural effusion or pneumothorax. A bilobed or 2 adjacent nodular density in the left mainstem bronchus with combined length of 2 cm (66/2) as seen previously concerning for an endobronchial lesion. This can be better evaluated with bronchoscopy. Upper Abdomen: Cirrhosis. Gallstone. Thickened gallbladder wall with pericholecystic stranding may be related to cirrhosis. If there is clinical concern for acute cholecystitis further evaluation with ultrasound is recommended. Musculoskeletal: Degenerative changes of the spine. No acute osseous pathology. IMPRESSION: 1. Diffuse right lung scarring, likely post infectious/inflammatory. Patchy ground-glass density in the right lower lobe may be sequela prior infection or represent recurrent infection. Clinical correlation is recommended. 2. Postsurgical changes of left upper lobectomy. Bilobed lesion in the left mainstem bronchus as seen on the prior CT. Bronchoscopy may provide better evaluation. 3. Cirrhosis. 4. Cholelithiasis. Thickened gallbladder wall with pericholecystic stranding may be related to cirrhosis. If there is clinical concern for acute cholecystitis further evaluation with ultrasound is recommended. 5. Aortic Atherosclerosis (ICD10-I70.0) and Emphysema (ICD10-J43.9). Electronically Signed   By: Anner Crete M.D.   On: 08/15/2019 20:02    ASSESSMENT: 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  PLAN:    1. 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 Patient underwent left upper lobe lobectomy in [place] on June 04, 2013. Final pathology results revealed  a well differentiated neuroendocrine tumor with positive bronchial margins. Patient did not receive adjuvant XRT or chemotherapy at that time.  Patient's most recent abdominal MRI on March 15, 2019 reviewed independently with stable to decrease in size of known lesions in patient's liver.  His most recent Y 90 ablation occurred on April 28, 2018.  Patient's chromogranin a levels are elevated, but  relatively stable ranging from 213-261.  Proceed with 120 mg subcutaneous lanreotide today.  Return to clinic in 4 weeks for further evaluation and continuation of treatment.   2.  Left mainstem endobronchial mass: Mass is closer to the left upper lobe lobectomy margin, therefore likely a local recurrence of disease given his positive margins after surgery in January 2015.  Patient has follow-up with pulmonology on Sep 12, 2019 for further evaluation and to set a date for possible ablation. 3.  Covid: Resolved.  Patient continues to have an oxygen requirement as well as lung scarring as indicated on CT scan on August 15, 2019. 4.  Urinary retention: Foley has been removed.  Patient reports he has follow-up with urology later this week.   Patient expressed understanding and was in agreement with this plan. He also understands that He can call clinic at any time with any questions, concerns, or complaints.   Cancer Staging Neuroendocrine carcinoma of lung [facility]) Staging form: Lung, AJCC 8th Edition - Clinical stage from 12/28/2016: Stage IVA (cT2a, cN0, cM1b) - Signed by Lloyd Huger, MD on 12/28/2016   Lloyd Huger, MD   09/10/2019 12:39 PM

## 2019-09-10 ENCOUNTER — Inpatient Hospital Stay: Payer: Medicare Other

## 2019-09-10 ENCOUNTER — Inpatient Hospital Stay: Payer: Medicare Other | Attending: Oncology

## 2019-09-10 ENCOUNTER — Encounter: Payer: Self-pay | Admitting: Oncology

## 2019-09-10 ENCOUNTER — Other Ambulatory Visit: Payer: Self-pay

## 2019-09-10 ENCOUNTER — Inpatient Hospital Stay (HOSPITAL_BASED_OUTPATIENT_CLINIC_OR_DEPARTMENT_OTHER): Payer: Medicare Other | Admitting: Oncology

## 2019-09-10 VITALS — BP 140/76 | HR 70 | Temp 94.2°F | Resp 20 | Wt 140.6 lb

## 2019-09-10 DIAGNOSIS — C7B8 Other secondary neuroendocrine tumors: Secondary | ICD-10-CM

## 2019-09-10 DIAGNOSIS — C7A8 Other malignant neuroendocrine tumors: Secondary | ICD-10-CM

## 2019-09-10 DIAGNOSIS — R338 Other retention of urine: Secondary | ICD-10-CM | POA: Insufficient documentation

## 2019-09-10 DIAGNOSIS — R918 Other nonspecific abnormal finding of lung field: Secondary | ICD-10-CM | POA: Diagnosis not present

## 2019-09-10 DIAGNOSIS — R7989 Other specified abnormal findings of blood chemistry: Secondary | ICD-10-CM | POA: Diagnosis not present

## 2019-09-10 DIAGNOSIS — Z8616 Personal history of COVID-19: Secondary | ICD-10-CM | POA: Diagnosis not present

## 2019-09-10 LAB — CBC WITH DIFFERENTIAL/PLATELET
Abs Immature Granulocytes: 0.03 10*3/uL (ref 0.00–0.07)
Basophils Absolute: 0.1 10*3/uL (ref 0.0–0.1)
Basophils Relative: 1 %
Eosinophils Absolute: 0.1 10*3/uL (ref 0.0–0.5)
Eosinophils Relative: 1 %
HCT: 46.8 % (ref 39.0–52.0)
Hemoglobin: 15 g/dL (ref 13.0–17.0)
Immature Granulocytes: 0 %
Lymphocytes Relative: 7 %
Lymphs Abs: 0.6 10*3/uL — ABNORMAL LOW (ref 0.7–4.0)
MCH: 29.9 pg (ref 26.0–34.0)
MCHC: 32.1 g/dL (ref 30.0–36.0)
MCV: 93.4 fL (ref 80.0–100.0)
Monocytes Absolute: 1 10*3/uL (ref 0.1–1.0)
Monocytes Relative: 11 %
Neutro Abs: 7 10*3/uL (ref 1.7–7.7)
Neutrophils Relative %: 80 %
Platelets: 196 10*3/uL (ref 150–400)
RBC: 5.01 MIL/uL (ref 4.22–5.81)
RDW: 14.6 % (ref 11.5–15.5)
WBC: 8.9 10*3/uL (ref 4.0–10.5)
nRBC: 0 % (ref 0.0–0.2)

## 2019-09-10 LAB — COMPREHENSIVE METABOLIC PANEL
ALT: 16 U/L (ref 0–44)
AST: 21 U/L (ref 15–41)
Albumin: 4 g/dL (ref 3.5–5.0)
Alkaline Phosphatase: 116 U/L (ref 38–126)
Anion gap: 7 (ref 5–15)
BUN: 17 mg/dL (ref 8–23)
CO2: 30 mmol/L (ref 22–32)
Calcium: 9.6 mg/dL (ref 8.9–10.3)
Chloride: 103 mmol/L (ref 98–111)
Creatinine, Ser: 0.84 mg/dL (ref 0.61–1.24)
GFR calc Af Amer: >60 mL/min (ref >60)
GFR calc non Af Amer: >60 mL/min (ref >60)
Glucose, Bld: 171 mg/dL — ABNORMAL HIGH (ref 70–99)
Potassium: 5 mmol/L (ref 3.5–5.1)
Sodium: 140 mmol/L (ref 135–145)
Total Bilirubin: 0.9 mg/dL (ref 0.3–1.2)
Total Protein: 7 g/dL (ref 6.5–8.1)

## 2019-09-10 MED ORDER — LANREOTIDE ACETATE 120 MG/0.5ML ~~LOC~~ SOLN
120.0000 mg | Freq: Once | SUBCUTANEOUS | Status: AC
  Administered 2019-09-10: 120 mg via SUBCUTANEOUS
  Filled 2019-09-10: qty 120

## 2019-09-11 LAB — CHROMOGRANIN A: Chromogranin A (ng/mL): 210.6 ng/mL — ABNORMAL HIGH (ref 0.0–101.8)

## 2019-09-12 ENCOUNTER — Encounter: Payer: Self-pay | Admitting: Pulmonary Disease

## 2019-09-12 ENCOUNTER — Other Ambulatory Visit: Payer: Self-pay

## 2019-09-12 ENCOUNTER — Ambulatory Visit (INDEPENDENT_AMBULATORY_CARE_PROVIDER_SITE_OTHER): Payer: Medicare Other | Admitting: Pulmonary Disease

## 2019-09-12 VITALS — BP 122/62 | HR 73 | Temp 97.1°F | Ht 64.0 in | Wt 145.8 lb

## 2019-09-12 DIAGNOSIS — J9611 Chronic respiratory failure with hypoxia: Secondary | ICD-10-CM | POA: Diagnosis not present

## 2019-09-12 DIAGNOSIS — J8410 Pulmonary fibrosis, unspecified: Secondary | ICD-10-CM | POA: Diagnosis not present

## 2019-09-12 DIAGNOSIS — Z8616 Personal history of COVID-19: Secondary | ICD-10-CM

## 2019-09-12 DIAGNOSIS — C7A8 Other malignant neuroendocrine tumors: Secondary | ICD-10-CM

## 2019-09-12 NOTE — H&P (View-Only) (Signed)
Subjective:    Patient ID: Jason Malone, male    DOB: Nov 20, 1940, 79 y.o.   MRN: [id number]  HPI The patient is a 79 year old former smoker with stage IVa neuroendocrine tumor of the lung metastatic to the liver who presents for follow-up of left mainstem endobronchial mass for potential bronchoscopy with cryoablation.  He is being followed at the cancer center by Dr. Delight Hoh. The patient had undergone left upper lobectomy by Dr. Eleanora Neighbor in January 2015. This revealedaneuroendocrine tumor (carcinoid) which was 4.7 cm in diameter. There was endobronchial involvement at the time. The mass was in the hilum and initially it was thought that patient would need pneumonectomy. During surgery it was noted that the tumor encircled the pulmonary artery. The dissection was noted to be "tedious". The initial margins of the bronchial stump were positive for tumor. However, once this was recognized additional resection was done and margins were clear on the final pathology. After resection the patient was presented at the multidisciplinary thoracic oncology conference and no further treatment other than observation was recommended. He was classified as a stage Ib. Patient was noted to have a recurrence of neuroendocrine disease on August 13, 2018when liver involvement was noted. He was started on lanreotide every 4 weeks.  In June 10, 2019 he contracted COVID-19.  He was admitted to the [facility].  He developed acute hypoxic respiratory failure due to COVID-19.  A CT scan performed on 11 June 2019 showed typical parenchymal involvement of COVID-19 on the right lung.  On the left lung he had a lobulated mass in the left mainstem bronchus.  This appeared to be arising from the suture line from the prior resection.  This is likely recurrent carcinoid tumor.  There was also worrisome finding of thinning along the esophageal margin of the bronchus but there appeared to be no clear esophageal  fistulization.  On follow-up CT performed 7 April this potential thinning along the esophageal margin appears to be distinctly separate from the lesion in question.  I suspect that the initial findings were related to inflammatory changes due to COVID-19.  On his initial presentation here 23 March he appeared quite debilitated, and was requiring oxygen at rest but sometimes up to 8 L with activity.  He was placed on Brovana and budesonide however did not tolerate it due to cough.  Suspect that this was due to the [facility] component.  We are avoiding LAMA/SAMAs due to issues with urinary retention.  He had an indwelling Foley due to urinary retention however this has now been removed.  He is being followed by urology in this regard.  He has been doing much better since his last visit in April 2021. He has not had any fevers, chills or sweats.  No lower extremity edema.  No orthopnea or paroxysmal nocturnal dyspnea.  He does not endorse any chest pain.  He has noted that his appetite has improved.  He is very spry and alert during this visit, he is very interactive.  He does note occasional wheezing but has not had dyspnea.  Recall that his new tumor in the left mainstem bronchus is occluding part of the bronchial lumen.  He has had no other complaint.  He has not required use of his oxygen over the last few days.  I have reviewed all radiographic studies with the patient and his wife.  We revisited the procedure of bronchoscopy with cryoablation (spray cryotherapy) and destruction of tumor.  He understands  of the procedure will have to be done under general anesthesia.  He also understands the potential risks involved.   Review of Systems A 10 point review of systems was performed and it is as noted above otherwise negative.  Immunization History  Administered Date(s) Administered   Fluad Quad(high Dose 65+) 01/24/2019   Influenza, High Dose Seasonal PF 02/12/2016, 02/14/2017, 01/17/2018    Influenza,inj,Quad PF,6+ Mos 02/14/2015   PFIZER SARS-COV-2 Vaccination 05/24/2019   Pneumococcal Conjugate-13 02/01/2014   Pneumococcal Polysaccharide-23 05/10/2008   Current Meds  Medication Sig   amLODipine (NORVASC) 2.5 MG tablet Take 1 tablet by mouth once daily (Patient taking differently: Take 2.5 mg by mouth daily. )   aspirin EC 81 MG tablet Take 81 mg by mouth daily.   atorvastatin (LIPITOR) 40 MG tablet TAKE 1 TABLET BY MOUTH ONCE DAILY FOR CHOLESTEROL (Patient taking differently: Take 40 mg by mouth daily. )   b complex vitamins capsule Take 1 capsule by mouth daily.   Calcium Carbonate-Vitamin D3 (CALCIUM 600/VITAMIN D) 600-400 MG-UNIT TABS Take 1 tablet by mouth daily.    cholecalciferol (VITAMIN D3) 25 MCG (1000 UNIT) tablet Take 1,000 Units by mouth daily.   Cinnamon 500 MG capsule Take 500 mg by mouth at bedtime. BEDTIME   finasteride (PROSCAR) 5 MG tablet Take 1 tablet (5 mg total) by mouth daily.   Glucose Blood (BLOOD GLUCOSE TEST STRIPS) STRP Use as directed to monitor FSBS once daily. Dx: E11.9.   guaiFENesin-dextromethorphan (ROBITUSSIN DM) 100-10 MG/5ML syrup Take 10 mLs by mouth every 4 (four) hours as needed for cough.   LANREOTIDE ACETATE [place] Inject 1 Dose into the skin every 30 (thirty) days.   losartan (COZAAR) 100 MG tablet Take 1 tablet (100 mg total) by mouth daily.   Multiple Vitamins-Minerals (MULTIVITAMIN WITH MINERALS) tablet Take 1 tablet by mouth daily. AM   polycarbophil (FIBERCON) 625 MG tablet Take 625 mg by mouth 2 (two) times daily. AM   tamsulosin (FLOMAX) 0.4 MG CAPS capsule Take 2 capsules (0.8 mg total) by mouth at bedtime.   vitamin C (ASCORBIC ACID) 500 MG tablet Take 500 mg by mouth daily. AM   zinc sulfate 220 (50 Zn) MG capsule Take 1 capsule (220 mg total) by mouth daily.        Objective:   Physical Exam BP 122/62 (BP Location: Left Arm, Patient Position: Sitting, Cuff Size: Normal)    Pulse 73    Temp (!) 97.1 F  (36.2 C) (Temporal)    Ht 5\' 4"  (1.626 m)    Wt 145 lb 12.8 oz (66.1 kg)    SpO2 92%    BMI 25.03 kg/m   GENERAL: Awake and alert, thin appearing gentleman, comfortable on room air.  No conversational dyspnea. HEAD: Normocephalic, atraumatic.  EYES: Pupils equal, round, reactive to light. No scleral icterus.  MOUTH: Nose/mouth/throat not examined due to masking requirements for COVID 19. NECK: Supple. No thyromegaly. No nodules. No JVD. Trachea is midline, no crepitus. PULMONARY:  Good air entry bilaterally, monophonic wheeze on the left.  No other adventitious sounds. CARDIOVASCULAR: S1 and S2.   Regular rate and rhythm. No rubs murmurs or gallops appreciated. GASTROINTESTINAL: Nondistended, soft. GU:No Foley. MUSCULOSKELETAL: No joint deformity, no clubbing, no edema.  NEUROLOGIC: Awake and alert, no gross focal deficit. Speech is fluent.  No gait disturbance. SKIN: Intact,warm,dry. Skin is somewhat pale. No rashes noted on limited exam. PSYCH: Mood and behavior normal.  We performed ambulatory oximetry, no oxygen desaturations noted.  Patient tolerated well.  CT chest shows lesion in the left main         Assessment & Plan:   Neuroendocrine carcinoma of the lung (carcinoid) stage IV Large lobulated endobronchial lesion left mainstem bronchus  Patient is at risk for complete occlusion of the left mainstem bronchus  There is significant postinfectious scarring on the right lung  This would create significant airway compromise for the patient  Proposed bronchoscopy with cryoablation (spray cryotherapy)  Tumor destruction/debulking/resection and/or potential stent placement (less likely)  Tumor appears to be originating from prior suture line  Suspect due to micrometastasis (note that during original surgical case there was positive margin HOWEVER, further resection was done until margin was clear by frozen section)  Benefits, limitations and potential  complications of the procedure were discussed with the patient and wife including, but not limited to bleeding, hemoptysis, respiratory failure requiring intubation and/or prolongued mechanical ventilation, infection, pneumothorax (collapse of lung) requiring chest tube placement, stroke from air embolism or even death  Proposed date of the procedure 19 Sep 2019 1 PM  The patient understands that he may need to be admitted overnight for observation due to underlying respiratory failure and also due to the nature of the neuroendocrine tumor to manage potential carcinoid symptoms post manipulation of the tumor  Postinflammatory pulmonary fibrosis Personal history of COVID-19 pneumonia Chronic hypoxic respiratory failure Sequela of COVID-19 Respiratory failure has RESOLVED  These issues add complexity to his management  Protein calorie malnutrition Debility and deconditioning He has made dramatic improvement Continue nutritional changes and exercise  Discussed with Dr. Grayland Ormond.  Renold Don, MD [place] [facility]   *This note was dictated using voice recognition software/Dragon.  Despite best efforts to proofread, errors can occur which can change the meaning.  Any change was purely unintentional.

## 2019-09-12 NOTE — Patient Instructions (Signed)
Call us as soon as your prostate procedure is completed so that we can schedule you for your bronchoscopy.  Procedure for bronchoscopy would have to be done within 3 to 4 weeks from this visit.  We will see you in follow-up in 6 weeks time.  We will do the procedure before that of course.

## 2019-09-12 NOTE — Progress Notes (Signed)
Subjective:    Patient ID: Jason Malone, male    DOB: 02-Jun-1940, 79 y.o.   MRN: [id number]  HPI The patient is a 79 year old former smoker with stage IVa neuroendocrine tumor of the lung metastatic to the liver who presents for follow-up of left mainstem endobronchial mass for potential bronchoscopy with cryoablation.  He is being followed at the cancer center by Dr. Delight Hoh. The patient had undergone left upper lobectomy by Dr. Eleanora Neighbor in January 2015. This revealedaneuroendocrine tumor (carcinoid) which was 4.7 cm in diameter. There was endobronchial involvement at the time. The mass was in the hilum and initially it was thought that patient would need pneumonectomy. During surgery it was noted that the tumor encircled the pulmonary artery. The dissection was noted to be "tedious". The initial margins of the bronchial stump were positive for tumor. However, once this was recognized additional resection was done and margins were clear on the final pathology. After resection the patient was presented at the multidisciplinary thoracic oncology conference and no further treatment other than observation was recommended. He was classified as a stage Ib. Patient was noted to have a recurrence of neuroendocrine disease on August 13, 2018when liver involvement was noted. He was started on lanreotide every 4 weeks.  In June 10, 2019 he contracted COVID-19.  He was admitted to the [facility].  He developed acute hypoxic respiratory failure due to COVID-19.  A CT scan performed on 11 June 2019 showed typical parenchymal involvement of COVID-19 on the right lung.  On the left lung he had a lobulated mass in the left mainstem bronchus.  This appeared to be arising from the suture line from the prior resection.  This is likely recurrent carcinoid tumor.  There was also worrisome finding of thinning along the esophageal margin of the bronchus but there appeared to be no clear esophageal  fistulization.  On follow-up CT performed 7 April this potential thinning along the esophageal margin appears to be distinctly separate from the lesion in question.  I suspect that the initial findings were related to inflammatory changes due to COVID-19.  On his initial presentation here 23 March he appeared quite debilitated, and was requiring oxygen at rest but sometimes up to 8 L with activity.  He was placed on Brovana and budesonide however did not tolerate it due to cough.  Suspect that this was due to the [facility] component.  We are avoiding LAMA/SAMAs due to issues with urinary retention.  He had an indwelling Foley due to urinary retention however this has now been removed.  He is being followed by urology in this regard.  He has been doing much better since his last visit in April 2021. He has not had any fevers, chills or sweats.  No lower extremity edema.  No orthopnea or paroxysmal nocturnal dyspnea.  He does not endorse any chest pain.  He has noted that his appetite has improved.  He is very spry and alert during this visit, he is very interactive.  He does note occasional wheezing but has not had dyspnea.  Recall that his new tumor in the left mainstem bronchus is occluding part of the bronchial lumen.  He has had no other complaint.  He has not required use of his oxygen over the last few days.  I have reviewed all radiographic studies with the patient and his wife.  We revisited the procedure of bronchoscopy with cryoablation (spray cryotherapy) and destruction of tumor.  He understands  of the procedure will have to be done under general anesthesia.  He also understands the potential risks involved.   Review of Systems A 10 point review of systems was performed and it is as noted above otherwise negative.  Immunization History  Administered Date(s) Administered  . Fluad Quad(high Dose 65+) 01/24/2019  . Influenza, High Dose Seasonal PF 02/12/2016, 02/14/2017, 01/17/2018  .  Influenza,inj,Quad PF,6+ Mos 02/14/2015  . PFIZER SARS-COV-2 Vaccination 05/24/2019  . Pneumococcal Conjugate-13 02/01/2014  . Pneumococcal Polysaccharide-23 05/10/2008   Current Meds  Medication Sig  . amLODipine (NORVASC) 2.5 MG tablet Take 1 tablet by mouth once daily (Patient taking differently: Take 2.5 mg by mouth daily. )  . aspirin EC 81 MG tablet Take 81 mg by mouth daily.  [name] atorvastatin (LIPITOR) 40 MG tablet TAKE 1 TABLET BY MOUTH ONCE DAILY FOR CHOLESTEROL (Patient taking differently: Take 40 mg by mouth daily. )  . b complex vitamins capsule Take 1 capsule by mouth daily.  . Calcium Carbonate-Vitamin D3 (CALCIUM 600/VITAMIN D) 600-400 MG-UNIT TABS Take 1 tablet by mouth daily.   . cholecalciferol (VITAMIN D3) 25 MCG (1000 UNIT) tablet Take 1,000 Units by mouth daily.  . Cinnamon 500 MG capsule Take 500 mg by mouth at bedtime. BEDTIME  . finasteride (PROSCAR) 5 MG tablet Take 1 tablet (5 mg total) by mouth daily.  . Glucose Blood (BLOOD GLUCOSE TEST STRIPS) STRP Use as directed to monitor FSBS once daily. Dx: E11.9.  . guaiFENesin-dextromethorphan (ROBITUSSIN DM) 100-10 MG/5ML syrup Take 10 mLs by mouth every 4 (four) hours as needed for cough.  [name] LANREOTIDE ACETATE [place] Inject 1 Dose into the skin every 30 (thirty) days.  [name] losartan (COZAAR) 100 MG tablet Take 1 tablet (100 mg total) by mouth daily.  . Multiple Vitamins-Minerals (MULTIVITAMIN WITH MINERALS) tablet Take 1 tablet by mouth daily. AM  . polycarbophil (FIBERCON) 625 MG tablet Take 625 mg by mouth 2 (two) times daily. AM  . tamsulosin (FLOMAX) 0.4 MG CAPS capsule Take 2 capsules (0.8 mg total) by mouth at bedtime.  . vitamin C (ASCORBIC ACID) 500 MG tablet Take 500 mg by mouth daily. AM  . zinc sulfate 220 (50 Zn) MG capsule Take 1 capsule (220 mg total) by mouth daily.        Objective:   Physical Exam BP 122/62 (BP Location: Left Arm, Patient Position: Sitting, Cuff Size: Normal)   Pulse 73   Temp (!) 97.1 F  (36.2 C) (Temporal)   Ht 5\' 4"  (1.626 m)   Wt 145 lb 12.8 oz (66.1 kg)   SpO2 92%   BMI 25.03 kg/m   GENERAL: Awake and alert, thin appearing gentleman, comfortable on room air.  No conversational dyspnea. HEAD: Normocephalic, atraumatic.  EYES: Pupils equal, round, reactive to light. No scleral icterus.  MOUTH: Nose/mouth/throat not examined due to masking requirements for COVID 19. NECK: Supple. No thyromegaly. No nodules. No JVD. Trachea is midline, no crepitus. PULMONARY:  Good air entry bilaterally, monophonic wheeze on the left.  No other adventitious sounds. CARDIOVASCULAR: S1 and S2.   Regular rate and rhythm. No rubs murmurs or gallops appreciated. GASTROINTESTINAL: Nondistended, soft. GU:No Foley. MUSCULOSKELETAL: No joint deformity, no clubbing, no edema.  NEUROLOGIC: Awake and alert, no gross focal deficit. Speech is fluent.  No gait disturbance. SKIN: Intact,warm,dry. Skin is somewhat pale. No rashes noted on limited exam. PSYCH: Mood and behavior normal.  We performed ambulatory oximetry, no oxygen desaturations noted.  Patient tolerated well.  CT  chest shows lesion in the left main         Assessment & Plan:   Neuroendocrine carcinoma of the lung (carcinoid) stage IV Large lobulated endobronchial lesion left mainstem bronchus  Patient is at risk for complete occlusion of the left mainstem bronchus  There is significant postinfectious scarring on the right lung  This would create significant airway compromise for the patient  Proposed bronchoscopy with cryoablation (spray cryotherapy)  Tumor destruction/debulking/resection and/or potential stent placement (less likely)  Tumor appears to be originating from prior suture line  Suspect due to micrometastasis (note that during original surgical case there was positive margin HOWEVER, further resection was done until margin was clear by frozen section)  Benefits, limitations and potential  complications of the procedure were discussed with the patient and wife including, but not limited to bleeding, hemoptysis, respiratory failure requiring intubation and/or prolongued mechanical ventilation, infection, pneumothorax (collapse of lung) requiring chest tube placement, stroke from air embolism or even death  Proposed date of the procedure 19 Sep 2019 1 PM  The patient understands that he may need to be admitted overnight for observation due to underlying respiratory failure and also due to the nature of the neuroendocrine tumor to manage potential carcinoid symptoms post manipulation of the tumor  Postinflammatory pulmonary fibrosis Personal history of COVID-19 pneumonia Chronic hypoxic respiratory failure Sequela of COVID-19 Respiratory failure has RESOLVED  These issues add complexity to his management  Protein calorie malnutrition Debility and deconditioning He has made dramatic improvement Continue nutritional changes and exercise  Discussed with Dr. Grayland Ormond.  Renold Don, MD [place] [facility]   *This note was dictated using voice recognition software/Dragon.  Despite best efforts to proofread, errors can occur which can change the meaning.  Any change was purely unintentional.

## 2019-09-12 NOTE — H&P (View-Only) (Signed)
Subjective:    Patient ID: Jason Malone, male    DOB: 1941/03/02, 79 y.o.   MRN: [id number]  HPI The patient is a 79 year old former smoker with stage IVa neuroendocrine tumor of the lung metastatic to the liver who presents for follow-up of left mainstem endobronchial mass for potential bronchoscopy with cryoablation.  He is being followed at the cancer center by Dr. Delight Hoh. The patient had undergone left upper lobectomy by Dr. Eleanora Neighbor in January 2015. This revealedaneuroendocrine tumor (carcinoid) which was 4.7 cm in diameter. There was endobronchial involvement at the time. The mass was in the hilum and initially it was thought that patient would need pneumonectomy. During surgery it was noted that the tumor encircled the pulmonary artery. The dissection was noted to be "tedious". The initial margins of the bronchial stump were positive for tumor. However, once this was recognized additional resection was done and margins were clear on the final pathology. After resection the patient was presented at the multidisciplinary thoracic oncology conference and no further treatment other than observation was recommended. He was classified as a stage Ib. Patient was noted to have a recurrence of neuroendocrine disease on August 13, 2018when liver involvement was noted. He was started on lanreotide every 4 weeks.  In June 10, 2019 he contracted COVID-19.  He was admitted to the [facility].  He developed acute hypoxic respiratory failure due to COVID-19.  A CT scan performed on 11 June 2019 showed typical parenchymal involvement of COVID-19 on the right lung.  On the left lung he had a lobulated mass in the left mainstem bronchus.  This appeared to be arising from the suture line from the prior resection.  This is likely recurrent carcinoid tumor.  There was also worrisome finding of thinning along the esophageal margin of the bronchus but there appeared to be no clear esophageal  fistulization.  On follow-up CT performed 7 April this potential thinning along the esophageal margin appears to be distinctly separate from the lesion in question.  I suspect that the initial findings were related to inflammatory changes due to COVID-19.  On his initial presentation here 23 March he appeared quite debilitated, and was requiring oxygen at rest but sometimes up to 8 L with activity.  He was placed on Brovana and budesonide however did not tolerate it due to cough.  Suspect that this was due to the [facility] component.  We are avoiding LAMA/SAMAs due to issues with urinary retention.  He had an indwelling Foley due to urinary retention however this has now been removed.  He is being followed by urology in this regard.  He has been doing much better since his last visit in April 2021. He has not had any fevers, chills or sweats.  No lower extremity edema.  No orthopnea or paroxysmal nocturnal dyspnea.  He does not endorse any chest pain.  He has noted that his appetite has improved.  He is very spry and alert during this visit, he is very interactive.  He does note occasional wheezing but has not had dyspnea.  Recall that his new tumor in the left mainstem bronchus is occluding part of the bronchial lumen.  He has had no other complaint.  He has not required use of his oxygen over the last few days.  I have reviewed all radiographic studies with the patient and his wife.  We revisited the procedure of bronchoscopy with cryoablation (spray cryotherapy) and destruction of tumor.  He understands  of the procedure will have to be done under general anesthesia.  He also understands the potential risks involved.   Review of Systems A 10 point review of systems was performed and it is as noted above otherwise negative.  Immunization History  Administered Date(s) Administered  . Fluad Quad(high Dose 65+) 01/24/2019  . Influenza, High Dose Seasonal PF 02/12/2016, 02/14/2017, 01/17/2018  .  Influenza,inj,Quad PF,6+ Mos 02/14/2015  . PFIZER SARS-COV-2 Vaccination 05/24/2019  . Pneumococcal Conjugate-13 02/01/2014  . Pneumococcal Polysaccharide-23 05/10/2008   Current Meds  Medication Sig  . amLODipine (NORVASC) 2.5 MG tablet Take 1 tablet by mouth once daily (Patient taking differently: Take 2.5 mg by mouth daily. )  . aspirin EC 81 MG tablet Take 81 mg by mouth daily.  [name] atorvastatin (LIPITOR) 40 MG tablet TAKE 1 TABLET BY MOUTH ONCE DAILY FOR CHOLESTEROL (Patient taking differently: Take 40 mg by mouth daily. )  . b complex vitamins capsule Take 1 capsule by mouth daily.  . Calcium Carbonate-Vitamin D3 (CALCIUM 600/VITAMIN D) 600-400 MG-UNIT TABS Take 1 tablet by mouth daily.   . cholecalciferol (VITAMIN D3) 25 MCG (1000 UNIT) tablet Take 1,000 Units by mouth daily.  . Cinnamon 500 MG capsule Take 500 mg by mouth at bedtime. BEDTIME  . finasteride (PROSCAR) 5 MG tablet Take 1 tablet (5 mg total) by mouth daily.  . Glucose Blood (BLOOD GLUCOSE TEST STRIPS) STRP Use as directed to monitor FSBS once daily. Dx: E11.9.  . guaiFENesin-dextromethorphan (ROBITUSSIN DM) 100-10 MG/5ML syrup Take 10 mLs by mouth every 4 (four) hours as needed for cough.  [name] LANREOTIDE ACETATE [place] Inject 1 Dose into the skin every 30 (thirty) days.  [name] losartan (COZAAR) 100 MG tablet Take 1 tablet (100 mg total) by mouth daily.  . Multiple Vitamins-Minerals (MULTIVITAMIN WITH MINERALS) tablet Take 1 tablet by mouth daily. AM  . polycarbophil (FIBERCON) 625 MG tablet Take 625 mg by mouth 2 (two) times daily. AM  . tamsulosin (FLOMAX) 0.4 MG CAPS capsule Take 2 capsules (0.8 mg total) by mouth at bedtime.  . vitamin C (ASCORBIC ACID) 500 MG tablet Take 500 mg by mouth daily. AM  . zinc sulfate 220 (50 Zn) MG capsule Take 1 capsule (220 mg total) by mouth daily.        Objective:   Physical Exam BP 122/62 (BP Location: Left Arm, Patient Position: Sitting, Cuff Size: Normal)   Pulse 73   Temp (!) 97.1 F  (36.2 C) (Temporal)   Ht 5\' 4"  (1.626 m)   Wt 145 lb 12.8 oz (66.1 kg)   SpO2 92%   BMI 25.03 kg/m   GENERAL: Awake and alert, thin appearing gentleman, comfortable on room air.  No conversational dyspnea. HEAD: Normocephalic, atraumatic.  EYES: Pupils equal, round, reactive to light. No scleral icterus.  MOUTH: Nose/mouth/throat not examined due to masking requirements for COVID 19. NECK: Supple. No thyromegaly. No nodules. No JVD. Trachea is midline, no crepitus. PULMONARY:  Good air entry bilaterally, monophonic wheeze on the left.  No other adventitious sounds. CARDIOVASCULAR: S1 and S2.   Regular rate and rhythm. No rubs murmurs or gallops appreciated. GASTROINTESTINAL: Nondistended, soft. GU:No Foley. MUSCULOSKELETAL: No joint deformity, no clubbing, no edema.  NEUROLOGIC: Awake and alert, no gross focal deficit. Speech is fluent.  No gait disturbance. SKIN: Intact,warm,dry. Skin is somewhat pale. No rashes noted on limited exam. PSYCH: Mood and behavior normal.  We performed ambulatory oximetry, no oxygen desaturations noted.  Patient tolerated well.  CT  chest shows lesion in the left main         Assessment & Plan:   Neuroendocrine carcinoma of the lung (carcinoid) stage IV Large lobulated endobronchial lesion left mainstem bronchus  Patient is at risk for complete occlusion of the left mainstem bronchus  There is significant postinfectious scarring on the right lung  This would create significant airway compromise for the patient  Proposed bronchoscopy with cryoablation (spray cryotherapy)  Tumor destruction/debulking/resection and/or potential stent placement (less likely)  Tumor appears to be originating from prior suture line  Suspect due to micrometastasis (note that during original surgical case there was positive margin HOWEVER, further resection was done until margin was clear by frozen section)  Benefits, limitations and potential  complications of the procedure were discussed with the patient and wife including, but not limited to bleeding, hemoptysis, respiratory failure requiring intubation and/or prolongued mechanical ventilation, infection, pneumothorax (collapse of lung) requiring chest tube placement, stroke from air embolism or even death  Proposed date of the procedure 19 Sep 2019 1 PM  The patient understands that he may need to be admitted overnight for observation due to underlying respiratory failure and also due to the nature of the neuroendocrine tumor to manage potential carcinoid symptoms post manipulation of the tumor  Postinflammatory pulmonary fibrosis Personal history of COVID-19 pneumonia Chronic hypoxic respiratory failure Sequela of COVID-19 Respiratory failure has RESOLVED  These issues add complexity to his management  Protein calorie malnutrition Debility and deconditioning He has made dramatic improvement Continue nutritional changes and exercise  Discussed with Dr. Grayland Ormond.  Renold Don, MD [facility]   *This note was dictated using voice recognition software/Dragon.  Despite best efforts to proofread, errors can occur which can change the meaning.  Any change was purely unintentional.

## 2019-09-13 ENCOUNTER — Telehealth: Payer: Self-pay | Admitting: Pulmonary Disease

## 2019-09-13 ENCOUNTER — Encounter: Payer: Self-pay | Admitting: Pulmonary Disease

## 2019-09-13 NOTE — Telephone Encounter (Signed)
Patient's wife stopped by the office and states that they saw the Urologist today and wanted to make Dr. Patsey Berthold aware that he would like him to go ahead and proceed with the Bronchoscopy procedure first and that it was more important than his prostate right now. After speaking with Dr. Patsey Berthold and making her aware I advised Jason Malone that we will try to arrange for 09/19/19 at 1pm.

## 2019-09-14 NOTE — Telephone Encounter (Signed)
Dr. Patsey Berthold confirmed the procedure for 09/19/19 at 1pm. I called and spoke with patient's wife and made her aware to arrive at 12 noon, location and for Mr. Ostermiller not to eat or drink after midnight. She verbalized understanding.

## 2019-09-14 NOTE — Telephone Encounter (Signed)
I called and left a voicemail making patient aware that his Covid test has been scheduled here at [facility] for 09/17/19 between 8am-1pm and location of testing.

## 2019-09-14 NOTE — Telephone Encounter (Signed)
Upon speaking with centralized scheduling on 09/13/19, they advised me that they could not authorize and schedule for a Wednesday Bronch procedure even with me explaining the type and the reasoning. I made Dr. Patsey Berthold aware and she is working on getting this fixed.

## 2019-09-17 ENCOUNTER — Other Ambulatory Visit
Admission: RE | Admit: 2019-09-17 | Discharge: 2019-09-17 | Disposition: A | Discharge status: Home / Self Care | Payer: Medicare Other | Source: Ambulatory Visit | Attending: Pulmonary Disease | Admitting: Pulmonary Disease

## 2019-09-17 DIAGNOSIS — Z20822 Contact with and (suspected) exposure to covid-19: Secondary | ICD-10-CM | POA: Insufficient documentation

## 2019-09-17 DIAGNOSIS — Z01812 Encounter for preprocedural laboratory examination: Secondary | ICD-10-CM | POA: Diagnosis present

## 2019-09-17 LAB — SARS CORONAVIRUS 2 (TAT 6-24 HRS): SARS Coronavirus 2: NEGATIVE

## 2019-09-19 ENCOUNTER — Encounter: Payer: Self-pay | Admitting: Pulmonary Disease

## 2019-09-19 ENCOUNTER — Encounter
Admission: RE | Disposition: A | Discharge status: Home / Self Care | Payer: Self-pay | Source: Home / Self Care | Attending: Pulmonary Disease

## 2019-09-19 ENCOUNTER — Ambulatory Visit: Payer: Medicare Other | Admitting: Certified Registered"

## 2019-09-19 ENCOUNTER — Other Ambulatory Visit: Payer: Self-pay

## 2019-09-19 ENCOUNTER — Ambulatory Visit
Admission: RE | Admit: 2019-09-19 | Discharge: 2019-09-19 | Disposition: A | Discharge status: Home / Self Care | Payer: Medicare Other | Attending: Pulmonary Disease | Admitting: Pulmonary Disease

## 2019-09-19 ENCOUNTER — Ambulatory Visit: Payer: Medicare Other

## 2019-09-19 DIAGNOSIS — I4891 Unspecified atrial fibrillation: Secondary | ICD-10-CM | POA: Insufficient documentation

## 2019-09-19 DIAGNOSIS — E46 Unspecified protein-calorie malnutrition: Secondary | ICD-10-CM | POA: Diagnosis not present

## 2019-09-19 DIAGNOSIS — I700 Atherosclerosis of aorta: Secondary | ICD-10-CM | POA: Insufficient documentation

## 2019-09-19 DIAGNOSIS — Z87891 Personal history of nicotine dependence: Secondary | ICD-10-CM | POA: Insufficient documentation

## 2019-09-19 DIAGNOSIS — K5790 Diverticulosis of intestine, part unspecified, without perforation or abscess without bleeding: Secondary | ICD-10-CM | POA: Diagnosis not present

## 2019-09-19 DIAGNOSIS — C7A090 Malignant carcinoid tumor of the bronchus and lung: Secondary | ICD-10-CM | POA: Insufficient documentation

## 2019-09-19 DIAGNOSIS — I10 Essential (primary) hypertension: Secondary | ICD-10-CM | POA: Diagnosis not present

## 2019-09-19 DIAGNOSIS — I4892 Unspecified atrial flutter: Secondary | ICD-10-CM | POA: Insufficient documentation

## 2019-09-19 DIAGNOSIS — Z7982 Long term (current) use of aspirin: Secondary | ICD-10-CM | POA: Diagnosis not present

## 2019-09-19 DIAGNOSIS — E119 Type 2 diabetes mellitus without complications: Secondary | ICD-10-CM | POA: Diagnosis not present

## 2019-09-19 DIAGNOSIS — Z8616 Personal history of COVID-19: Secondary | ICD-10-CM | POA: Insufficient documentation

## 2019-09-19 DIAGNOSIS — N400 Enlarged prostate without lower urinary tract symptoms: Secondary | ICD-10-CM | POA: Insufficient documentation

## 2019-09-19 DIAGNOSIS — J9611 Chronic respiratory failure with hypoxia: Secondary | ICD-10-CM | POA: Insufficient documentation

## 2019-09-19 DIAGNOSIS — C787 Secondary malignant neoplasm of liver and intrahepatic bile duct: Secondary | ICD-10-CM | POA: Insufficient documentation

## 2019-09-19 DIAGNOSIS — Z79899 Other long term (current) drug therapy: Secondary | ICD-10-CM | POA: Diagnosis not present

## 2019-09-19 DIAGNOSIS — Z9889 Other specified postprocedural states: Secondary | ICD-10-CM

## 2019-09-19 HISTORY — PX: ELECTROMAGNETIC NAVIGATION BROCHOSCOPY: SHX5369

## 2019-09-19 LAB — GLUCOSE, CAPILLARY: Glucose-Capillary: 126 mg/dL — ABNORMAL HIGH (ref 70–99)

## 2019-09-19 SURGERY — ELECTROMAGNETIC NAVIGATION BRONCHOSCOPY
Anesthesia: General | Laterality: Left

## 2019-09-19 MED ORDER — FAMOTIDINE 20 MG PO TABS
ORAL_TABLET | ORAL | Status: AC
  Administered 2019-09-19: 20 mg via ORAL
  Filled 2019-09-19: qty 1

## 2019-09-19 MED ORDER — EPHEDRINE SULFATE 50 MG/ML IJ SOLN
INTRAMUSCULAR | Status: DC | PRN
  Administered 2019-09-19 (×2): 5 mg via INTRAVENOUS

## 2019-09-19 MED ORDER — BUTAMBEN-TETRACAINE-BENZOCAINE 2-2-14 % EX AERO
1.0000 | INHALATION_SPRAY | Freq: Once | CUTANEOUS | Status: DC
  Filled 2019-09-19: qty 20

## 2019-09-19 MED ORDER — SODIUM CHLORIDE 0.9 % IV SOLN: INTRAVENOUS | Status: DC | PRN

## 2019-09-19 MED ORDER — FAMOTIDINE 20 MG PO TABS: 20.0000 mg | ORAL_TABLET | Freq: Once | ORAL | Status: AC

## 2019-09-19 MED ORDER — ROCURONIUM BROMIDE 100 MG/10ML IV SOLN
INTRAVENOUS | Status: DC | PRN
  Administered 2019-09-19: 15 mg via INTRAVENOUS
  Administered 2019-09-19: 10 mg via INTRAVENOUS

## 2019-09-19 MED ORDER — PHENYLEPHRINE HCL (PRESSORS) 10 MG/ML IV SOLN
INTRAVENOUS | Status: AC
  Filled 2019-09-19: qty 1

## 2019-09-19 MED ORDER — SUCCINYLCHOLINE CHLORIDE 200 MG/10ML IV SOSY
PREFILLED_SYRINGE | INTRAVENOUS | Status: AC
  Filled 2019-09-19: qty 10

## 2019-09-19 MED ORDER — SUCCINYLCHOLINE CHLORIDE 20 MG/ML IJ SOLN
INTRAMUSCULAR | Status: DC | PRN
  Administered 2019-09-19: 120 mg via INTRAVENOUS

## 2019-09-19 MED ORDER — ONDANSETRON HCL 4 MG/2ML IJ SOLN: 4.0000 mg | Freq: Once | INTRAMUSCULAR | Status: DC | PRN

## 2019-09-19 MED ORDER — SUGAMMADEX SODIUM 200 MG/2ML IV SOLN
INTRAVENOUS | Status: DC | PRN
  Administered 2019-09-19: 132.2 mg via INTRAVENOUS

## 2019-09-19 MED ORDER — SODIUM CHLORIDE 0.9 % IV SOLN: Freq: Once | INTRAVENOUS | Status: AC

## 2019-09-19 MED ORDER — FENTANYL CITRATE (PF) 100 MCG/2ML IJ SOLN: 25.0000 ug | INTRAMUSCULAR | Status: DC | PRN

## 2019-09-19 MED ORDER — FENTANYL CITRATE (PF) 100 MCG/2ML IJ SOLN
INTRAMUSCULAR | Status: DC | PRN
  Administered 2019-09-19 (×4): 25 ug via INTRAVENOUS

## 2019-09-19 MED ORDER — LIDOCAINE HCL (PF) 2 % IJ SOLN
INTRAMUSCULAR | Status: AC
  Filled 2019-09-19: qty 10

## 2019-09-19 MED ORDER — LIDOCAINE HCL (CARDIAC) PF 100 MG/5ML IV SOSY
PREFILLED_SYRINGE | INTRAVENOUS | Status: DC | PRN
  Administered 2019-09-19: 70 mg via INTRAVENOUS

## 2019-09-19 MED ORDER — PROPOFOL 10 MG/ML IV BOLUS
INTRAVENOUS | Status: DC | PRN
  Administered 2019-09-19: 100 mg via INTRAVENOUS

## 2019-09-19 MED ORDER — PROPOFOL 10 MG/ML IV BOLUS
INTRAVENOUS | Status: AC
  Filled 2019-09-19: qty 20

## 2019-09-19 MED ORDER — PHENYLEPHRINE HCL (PRESSORS) 10 MG/ML IV SOLN
INTRAVENOUS | Status: DC | PRN
  Administered 2019-09-19: 100 ug via INTRAVENOUS
  Administered 2019-09-19 (×2): 50 ug via INTRAVENOUS

## 2019-09-19 MED ORDER — ROCURONIUM BROMIDE 10 MG/ML (PF) SYRINGE
PREFILLED_SYRINGE | INTRAVENOUS | Status: AC
  Filled 2019-09-19: qty 10

## 2019-09-19 MED ORDER — FENTANYL CITRATE (PF) 100 MCG/2ML IJ SOLN
INTRAMUSCULAR | Status: AC
  Filled 2019-09-19: qty 2

## 2019-09-19 NOTE — OR Nursing (Signed)
May resume aspirin tomorrow per Dr Patsey Berthold secure chat - added to discharge intstructions med section.

## 2019-09-19 NOTE — Discharge Instructions (Addendum)

## 2019-09-19 NOTE — Interval H&P Note (Signed)
History and Physical Interval Note:  09/19/2019 12:31 PM  Jason Malone  has presented today for surgery, with the diagnosis of Left mainstem bronchus tumor.  The various methods of treatment have been discussed with the patient and family. After consideration of risks, benefits and other options for treatment, the patient has consented to  [place] for tumor destruction as a surgical intervention.  The patient's history has been reviewed, patient examined, no change in status, stable for surgery.  I have reviewed the patient's chart and labs.  Questions were answered to the patient's satisfaction.     Renold Don, MD [place] [facility]

## 2019-09-19 NOTE — Anesthesia Procedure Notes (Signed)
Procedure Name: Intubation Date/Time: 09/19/2019 1:16 PM Performed by: Jerrye Noble, CRNA Pre-anesthesia Checklist: Patient identified, Emergency Drugs available, Suction available and Patient being monitored Patient Re-evaluated:Patient Re-evaluated prior to induction Oxygen Delivery Method: Circle system utilized Preoxygenation: Pre-oxygenation with 100% oxygen Induction Type: IV induction Ventilation: Mask ventilation without difficulty and Oral airway inserted - appropriate to patient size Laryngoscope Size: McGraph and 4 Grade View: Grade I Tube type: Oral Tube size: 9.0 mm Number of attempts: 1 Airway Equipment and Method: Stylet and Oral airway Placement Confirmation: ETT inserted through vocal cords under direct vision,  positive ETCO2 and breath sounds checked- equal and bilateral Secured at: 22 cm Tube secured with: Tape Dental Injury: Teeth and Oropharynx as per pre-operative assessment

## 2019-09-19 NOTE — Anesthesia Preprocedure Evaluation (Addendum)
Anesthesia Evaluation  Patient identified by MRN, date of birth, ID band Patient awake    Reviewed: Allergy & Precautions, H&P , NPO status , Patient's Chart, lab work & pertinent test results, reviewed documented beta blocker date and time   Airway Mallampati: II  TM Distance: >3 FB Neck ROM: full    Dental  (+) Edentulous Upper, Edentulous Lower   Pulmonary shortness of breath and with exertion, neg sleep apnea, pneumonia, resolved, neg COPD, Recent URI  (Had COVID and is still SOB), Patient abstained from smoking., former smoker,  S/p LULobectomy   Pulmonary exam normal breath sounds clear to auscultation       Cardiovascular Exercise Tolerance: Good hypertension, On Home Beta Blockers and On Medications (-) angina(-) CAD, (-) Past MI, (-) Cardiac Stents and (-) CABG Normal cardiovascular exam+ dysrhythmias Atrial Fibrillation (-) Valvular Problems/Murmurs Rhythm:regular Rate:Normal     Neuro/Psych negative neurological ROS  negative psych ROS   GI/Hepatic negative GI ROS, Neg liver ROS,   Endo/Other  diabetes, Type 2  Renal/GU Renal disease  negative genitourinary   Musculoskeletal   Abdominal   Peds  Hematology negative hematology ROS (+)   Anesthesia Other Findings Past Medical History:   Full dentures                                                  Comment:upper and lower   Wears glasses                                                Enlarged prostate                                            Hypomagnesemia                                               H/O hypokalemia                                              Diverticulitis                                                 Comment:DIVERTICULOSIS   Hyperlipemia                                                 Paroxysmal atrial flutter (HCC)                                Comment:a. 03/2013->no recurrence;  b. CHA2DS2VASc =  2-->not currently  on anticoagulation;  c.               05/2012 Echo: EF 55-60%, normal RV.   Allergic rhinitis                                            IFG (impaired fasting glucose)                               Monocytosis                                                  Benign prostatic hypertrophy with lower urinar*              Chest pain                                                     Comment:a. 05/2013 MV: EF 70%, no ischemia.   Lung cancer ([place])                                              Comment:a. carcinoid, left lung, Stage 1b (T2a, N0,               cM0);  b. 05/2013 s/p VATS & LULobectomy.   Essential hypertension                                         Comment:CONTROLLED ON MEDS   Hx of atrial fibrillation without current medi*                Comment:CARDIOLOGIST-DR GOLLAN/ [facility]   Reproductive/Obstetrics negative OB ROS                            Anesthesia Physical  Anesthesia Plan  ASA: III  Anesthesia Plan: General   Post-op Pain Management:    Induction: Intravenous  PONV Risk Score and Plan: 1 and Ondansetron, Dexamethasone and Treatment may vary due to age or medical condition  Airway Management Planned: Oral ETT  Additional Equipment:   Intra-op Plan:   Post-operative Plan: Extubation in OR and Possible Post-op intubation/ventilation  Informed Consent: I have reviewed the patients History and Physical, chart, labs and discussed the procedure including the risks, benefits and alternatives for the proposed anesthesia with the patient or authorized representative who has indicated his/her understanding and acceptance.     Dental Advisory Given  Plan Discussed with: Anesthesiologist, CRNA and Surgeon  Anesthesia Plan Comments:         Anesthesia Quick Evaluation

## 2019-09-19 NOTE — Transfer of Care (Signed)
Immediate Anesthesia Transfer of Care Note  Patient: Jason Malone  Procedure(s) Performed: ELECTROMAGNETIC NAVIGATION BRONCHOSCOPY (Left )  Patient Location: PACU  Anesthesia Type:General  Level of Consciousness: awake, alert  and oriented  Airway & Oxygen Therapy: Patient Spontanous Breathing and Patient connected to face mask oxygen  Post-op Assessment: Report given to RN and Post -op Vital signs reviewed and stable  Post vital signs: Reviewed and stable  Last Vitals:  Vitals Value Taken Time  BP 113/55 09/19/19 1407  Temp 36.1 C 09/19/19 1407  Pulse 70 09/19/19 1415  Resp 17 09/19/19 1415  SpO2 95 % 09/19/19 1415  Vitals shown include unvalidated device data.  Last Pain:  Vitals:   09/19/19 1407  TempSrc:   PainSc: 0-No pain         Complications: No apparent anesthesia complications

## 2019-09-19 NOTE — Op Note (Signed)
PROCEDURE: BRONCHOSCOPY with Cryoablation of tumor and tumor destruction, LEFT mainstem bronchus.  PROCEDURE DATE: 09/19/2019  TIME: 1330 hrs. NAME:  Jason Malone  DOB:1941-02-01  MRN: [id number] LOC:  ARPO/None    HOSP DAY: @LENGTHOFSTAYDAYS @ CODE STATUS:   Code Status History    Date Active Date Inactive Code Status Order ID Comments User Context   07/03/2019 0644 07/06/2019 1936 Full Code [id number]  Shela Leff, MD ED   06/14/2019 1054 06/17/2019 1537 Full Code [id number]  Allie Bossier, MD Inpatient   06/10/2019 2352 06/14/2019 1008 Full Code [id number]  Anda Latina, MD ED   06/04/2013 1642 06/10/2013 1753 Full Code [id number]  Caryn Bee Inpatient   Advance Care Planning Activity    Indications/Preliminary Diagnosis:   1.  Recurrent carcinoid tumor, left mainstem bronchus 2.  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      Unable to obtain consent; procedure performed on emergent basis.     Other:    Benefits, limitations and potential complications of the procedure were discussed with the patient/family  including, but not limited to bleeding, hemoptysis, respiratory failure requiring intubation and/or prolongued mechanical ventilation, infection, pneumothorax (collapse of lung) requiring chest tube placement, stroke from air embolism or even death.  Patient is aware that procedure will be done under general anesthesia.  Patient is also aware of the potential complications from spray cryotherapy.  Patient agrees to proceed.   PRESEDATION  ASSESSMENT: History and Physical has been performed. Patient meds and allergies have been reviewed. Presedation airway examination has been performed and documented. Baseline vital signs, sedation score, oxygenation status, and cardiac rhythm were reviewed. Patient was deemed to be in satisfactory condition to undergo the procedure.   Type of anesthesia: General endotracheal  Operator: Renold Don, MD Assistant/scrub: Liborio Nixon, RRT Circulator: Annia Belt, RRT Anesthesiologist/CRNA: Martha Clan, MD/Crystal Clarisa [facility], CRNA   PROCEDURE DETAILS: The patient was taken to Procedure Room 2 (Bronchoscopy Suite) where appropriate route was performed and correct patient, name, & ID confirmed.  Patient was inducted under general anesthesia and intubated by anesthesiologist/CRNA.  He was intubated with an #9.0 ETT to allow for passive venting during spray cryotherapy.  A Portex adapter was placed on the flange of the ET tube.  Utilizing the Portex adapter anatomic tour of the bronchial tree was performed with post therapeutic video bronchoscope.  The right bronchial tree showed no endobronchial lesions.  On the left there was a polypoid lesion obstructing the left mainstem bronchus approximately 90%.  At this point we proceeded to perform cryoablation of the mass with the truFreeze spray cryotherapy probe.  The patient received 3 cycles of 5-second total freeze of the lesion. Passive venting was allowed during the cycles.  Close monitoring of the patient checking for chest rise etc. during the spray procedure was performed by the circulator.  CRNA managed time off anesthesia circuit.  Once this was completed the lesion was blanched with 3 mL of 1-10,000 epinephrine.  A 10 mm [employer] "Captivator" snare was then utilized to capture the polypoid mass and  transected at the base.  The mass was then removed and placed in formalin for histologic analysis.  Once this was removed, a second polypoid mass  could be seen beyond this first mass.  Again 3 cycles of 5-second total freeze of the lesion with passive venting was performed on this mass.  The edges of the bronchial wall were also sprayed at this point.  Careful monitoring of the patient and monitoring of the patient off the anesthesia circuit time was performed as above.  At this point the area was lavaged with normal saline.  Small amounts of blood were suctioned.  This area was again blanch with 1-10,000 epinephrine solution a total of 3 mL.  The mass was then snared with the Captivator snare.  This was removed.  Remnants of the mass were then removed by use of Precisor forceps.  At this point the lumen of the left mainstem bronchus was totally open and clear.  The previous surgical suture line seem to be the origin of these polypoid lesions.  This is highly suspicious for recurrent carcinoid tumor.  There was some studding of the suture line area, this will need to be likely cryoablated on a subsequent bronchoscopy.  The patient had received maximum treatment for today.  Bronchial lumen was patent after excision/destruction of the tumor mass.  Once this was completed the airway was suctioned and lavaged and cleared of any residual heme.  Total blood loss was less than 5 mL.  At this point the patient received a total of 9 mL of 1% lidocaine via bronchial lavage.  The bronchoscope was removed and the procedure was terminated.  Patient was allowed to emerge from general anesthesia and was extubated in the procedure room without sequela.  He was transferred to the PACU in satisfactory condition.  Chest x-ray showed no pneumothorax.  Patient tolerated the procedure well.    Insertion Route (Place X beside choice below)   Nasal   Oral  X Endotracheal Tube   Tracheostomy   INTRAPROCEDURE MEDICATIONS:  Medication Amt Dose  Medication Amt Dose  Lidocaine 1% 9 mL  Epinephrine 1:10,000 sol 6 mL  Xylocaine 4%  cc  Cocaine  cc   TECHNICAL PROCEDURES:  (Place X beside choice below)   Procedures  Description    None     Electrocautery   X  Spray cryotherapy     Balloon Dilatation     Bronchography     Stent Placement     Therapeutic Aspiration     Laser/Argon Plasma    Brachytherapy Catheter Placement   X Destruction of tumor     SPECIMENS (Sites): (Place X beside choice below)  Specimens Description   No Specimens Obtained     Washings    Lavage   X Excised tumor  polypoid mass   Fine Needle Aspirates    Brushings    Sputum    FINDINGS:  Polypoid mass originating from prior suture line, occluding LEFT mainstem bronchus   ESTIMATED BLOOD LOSS: Less than 5 mL  COMPLICATIONS/RESOLUTION: none   IMPRESSION:POST-PROCEDURE DX:  1.  Recurrent carcinoid tumor, LEFT main 2.  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  Patent LEFT main after intervention.  RECOMMENDATION/PLAN:  1.  Await pathology reports 2.  Will need rebronchoscopy in 3 weeks with repeat cryotherapy to treat margins.   Renold Don, MD [place] [facility]

## 2019-09-20 NOTE — Anesthesia Postprocedure Evaluation (Signed)
Anesthesia Post Note  Patient: Jason Malone  Procedure(s) Performed: ELECTROMAGNETIC NAVIGATION BRONCHOSCOPY (Left )  Patient location during evaluation: PACU Anesthesia Type: General Level of consciousness: awake and alert Pain management: pain level controlled Vital Signs Assessment: post-procedure vital signs reviewed and stable Respiratory status: spontaneous breathing, nonlabored ventilation, respiratory function stable and patient connected to nasal cannula oxygen Cardiovascular status: blood pressure returned to baseline and stable Postop Assessment: no apparent nausea or vomiting Anesthetic complications: no     Last Vitals:  Vitals:   09/19/19 1443 09/19/19 1502  BP: 138/75 (!) 141/66  Pulse: 70 76  Resp: 18 18  Temp: (!) 36.1 C (!) 36.2 C  SpO2: 92% 94%    Last Pain:  Vitals:   09/19/19 1502  TempSrc: Temporal  PainSc: 0-No pain                 Martha Clan

## 2019-09-21 LAB — SURGICAL PATHOLOGY

## 2019-09-24 ENCOUNTER — Telehealth: Payer: Self-pay | Admitting: Pulmonary Disease

## 2019-09-24 NOTE — Telephone Encounter (Signed)
Dr. Patsey Berthold called and spoke with patient about repeat procedure. Patient expressed understanding. Email has been sent. Surgery request faxed to [phone]. Case #:[id number]  [id number] DX: Carcinoid tumor of left lung Type of bronch: Repeat bronchoscopy for second cryo treatment General cryoablation October 10, 2019 at 1pm

## 2019-09-24 NOTE — Telephone Encounter (Signed)
Called and spoke with Olive at [facility]. Procedure codes [phone] and [phone] are valid and billable codes which does not require PA. Call Ref # [id number]. Rhonda J Cobb

## 2019-09-25 NOTE — Telephone Encounter (Signed)
Called and spoke with patient to update him about Covid testing date and time. Testing is 6/1 between 8-10:30. Patient expressed understanding. Nothing further needed at this time.

## 2019-09-27 ENCOUNTER — Telehealth: Payer: Self-pay | Admitting: Family Medicine

## 2019-09-27 DIAGNOSIS — N401 Enlarged prostate with lower urinary tract symptoms: Secondary | ICD-10-CM

## 2019-09-27 MED ORDER — TAMSULOSIN HCL 0.4 MG PO CAPS: 0.8000 mg | ORAL_CAPSULE | Freq: Every day | ORAL | 3 refills | Status: DC

## 2019-09-27 NOTE — Telephone Encounter (Signed)
Pt request refill   tamsulosin (FLOMAX) 0.4 MG CAPS capsule Pt takes TID  Pt states he had refills from the dr when dc'd from hospital for this med several months back, and Kristeen Miss has never filled for him..  Pt states he called our office 2 days ago and was told they would send request to [facility], but I do not see anything recorded. Pt has no idea who prescribed this for him last, he is at [facility] to pick up this med, and does not have the bottle. Pt is requesting this refill, as soon as possible sent to   [place], [place] Phone:  [phone]  Fax:  [phone]

## 2019-10-01 ENCOUNTER — Other Ambulatory Visit: Payer: Self-pay

## 2019-10-01 ENCOUNTER — Encounter
Admission: RE | Admit: 2019-10-01 | Discharge: 2019-10-01 | Disposition: A | Discharge status: Home / Self Care | Payer: Medicare Other | Source: Ambulatory Visit | Attending: Pulmonary Disease | Admitting: Pulmonary Disease

## 2019-10-01 NOTE — Patient Instructions (Addendum)
Your procedure is scheduled on: 10-10-19 [facility] Report to Same Day Surgery 2nd floor medical mall [facility] Entrance-take elevator on left to 2nd floor.  Check in with surgery information desk.) To find out your arrival time please call [phone] between 1PM - 3PM on 10-09-19 TUESDAY  Remember: Instructions that are not followed completely may result in serious medical risk, up to and including death, or upon the discretion of your surgeon and anesthesiologist your surgery may need to be rescheduled.    _x___ 1. Do not eat food after midnight the night before your procedure. You may drink clear liquids up to 2 hours before you are scheduled to arrive at the hospital for your procedure.  Do not drink clear liquids within 2 hours of your scheduled arrival to the hospital.  Clear liquids include  --Water or Apple juice without pulp  --Gatorade  --Black Coffee or Clear Tea (No milk, no creamers, do not add anything to the coffee or Tea-ok to add sugar if needed)   ____Ensure clear carbohydrate drink on the way to the hospital for bariatric patients  ____Ensure clear carbohydrate drink 3 hours before surgery.    __x__ 2. No Alcohol for 24 hours before or after surgery.   __x__3. No Smoking or e-cigarettes for 24 prior to surgery.  Do not use any chewable tobacco products for at least 6 hour prior to surgery   ____  4. Bring all medications with you on the day of surgery if instructed.    __x__ 5. Notify your doctor if there is any change in your medical condition     (cold, fever, infections).    x___6. On the morning of surgery brush your teeth with toothpaste and water.  You may rinse your mouth with mouth wash if you wish.  Do not swallow any toothpaste or mouthwash.   Do not wear jewelry, make-up, hairpins, clips or nail polish.  Do not wear lotions, powders, or perfumes. You may wear deodorant.  Do not shave 48 hours prior to surgery. Men may shave face and neck.  Do not  bring valuables to the hospital.    [facility] is not responsible for any belongings or valuables.               Contacts, dentures or bridgework may not be worn into surgery.  Leave your suitcase in the car. After surgery it may be brought to your room.  For patients admitted to the hospital, discharge time is determined by your treatment team.  _  Patients discharged the day of surgery will not be allowed to drive home.  You will need someone to drive you home and stay with you the night of your procedure.    Please read over the following fact sheets that you were given:   [facility] Preparing for Surgery   _x___ TAKE THE FOLLOWING MEDICATION THE MORNING OF SURGERY WITH A SMALL SIP OF WATER. These include:  1. NORVASC (AMLODIPINE)  2. BENADRYL (DIPHENHYDRAMINE)  3.  4.  5.  6.  ____Fleets enema or Magnesium Citrate as directed.   ____ Use CHG Soap or sage wipes as directed on instruction sheet   ____ Use inhalers on the day of surgery and bring to hospital day of surgery  ____ Stop Metformin and Janumet 2 days prior to surgery.    ____ Take 1/2 of usual insulin dose the night before surgery and none on the morning surgery.   _x___ Follow recommendations from Cardiologist,  Pulmonologist or PCP regarding stopping Aspirin, Coumadin, Plavix ,Eliquis, Effient, or Pradaxa, and Pletal-CALL DR GONZALEZ'S OFFICE ABOUT WHEN TO STOP YOUR ASPIRIN   X____Stop Anti-inflammatories such as Advil, Aleve, Ibuprofen, Motrin, Naproxen, Naprosyn, Goodies powders or aspirin products 7 DAYS PRIOR TO YOUR SURGERY-OK to take Tylenol    _x___ Stop supplements until after surgery-STOP CINNAMON 7 DAYS PRIOR TO SURGERY-YOU MAY RESUME AFTER YOUR SURGERY   ____ Bring C-Pap to the hospital.

## 2019-10-05 NOTE — Progress Notes (Signed)
[place]  [phone] [phone]  ID: Jason Malone OB: 1940/06/22  MR#: [id number]  YHC#:[id number]  Patient Care Team: Delsa Grana, PA-C as PCP - General (Family Medicine) [name], Forest Gleason, MD (General Surgery) Nestor Lewandowsky, MD as Referring Physician (Cardiothoracic Surgery) Minna Merritts, MD as Consulting Physician (Cardiology) [name], MD as Consulting Physician (Cardiothoracic Surgery) Lloyd Huger, MD as Consulting Physician (Oncology) Sanda Klein Satira Anis, MD as Attending Physician (Family Medicine)   CHIEF COMPLAINT: 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  INTERVAL HISTORY: Patient returns to clinic today for further evaluation and continuation of lanreotide.  He recently had biopsy and ablation of an endobronchial lesion that was consistent with a neuroendocrine tumor.  He has a second procedure scheduled for tomorrow.  Since his treatment, patient's breathing has significantly improved and he no longer requires oxygen.  He continues to have weakness and fatigue, but admits this is improving as well. He has no neurologic complaints.  He has a good appetite and denies weight loss.  He denies any chest pain, cough, or hemoptysis.  He denies any abdominal pain.  He has no nausea, vomiting, constipation, or diarrhea.  He has no urinary complaints.  Patient offers no further specific complaints today.  REVIEW OF SYSTEMS:   Review of Systems  Constitutional: Positive for malaise/fatigue. Negative for fever and weight loss.  Respiratory: Negative.  Negative for cough, hemoptysis and shortness of breath.   Cardiovascular: Negative.  Negative for chest pain and leg swelling.  Gastrointestinal: Negative.  Negative for abdominal pain, diarrhea, nausea and vomiting.  Genitourinary: Negative.  Negative for dysuria.  Musculoskeletal: Negative.  Negative for back pain.  Skin: Negative.  Negative for rash.   Neurological: Positive for weakness. Negative for dizziness, sensory change and focal weakness.  Psychiatric/Behavioral: Negative.  The patient is not nervous/anxious.     As per HPI. Otherwise, a complete review of systems is negative.  PAST MEDICAL HISTORY: Past Medical History:  Diagnosis Date  . Allergic rhinitis   . Benign prostatic hypertrophy with lower urinary tract symptoms (LUTS)   . Chest pain    a. 05/2013 MV: EF 70%, no ischemia.  . Diverticulitis    DIVERTICULOSIS  . Enlarged prostate   . Essential hypertension    CONTROLLED ON MEDS  . Full dentures    upper and lower  . H/O hypokalemia   . Hx of atrial fibrillation without current medication    CARDIOLOGIST-DR [facility]  . 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 Hyperlipemia   . Hypomagnesemia   . IFG (impaired fasting glucose)   . Liver cancer ([place])   . Liver cancer ([place]) 12/08/2016  . Liver mass, left lobe 08/28/2016   Probably hemangioma; will get MRI of liver, refer to GI  . Lung cancer ([place])    a. carcinoid, left lung, Stage 1b (T2a, N0, cM0);  b. 05/2013 s/p VATS & LULobectomy.  . Monocytosis   . Osteopenia   . Paroxysmal atrial flutter (H[facility])    a. 03/2013->no recurrence;  b. CHA2DS2VASc = 2-->not currently on anticoagulation;  c. 05/2012 Echo: EF 55-60%, normal RV.  [name] Wears glasses     PAST SURGICAL HISTORY: Past Surgical History:  Procedure Laterality Date  . BRONCHOSCOPY     04/06/2013  . CARDIOVASCULAR STRESS TEST  05/2013   a. No evidence of ischemia or infarct, EF 70%, no WMAs  . CATARACT EXTRACTION W/PHACO  Left 07/14/2015   Procedure: CATARACT EXTRACTION PHACO AND INTRAOCULAR LENS PLACEMENT (IOC);  Surgeon: [name], MD;  Location: [place];  Service: Ophthalmology;  Laterality: Left;  . CATARACT EXTRACTION W/PHACO Right 10/01/2015   Procedure: CATARACT EXTRACTION PHACO AND INTRAOCULAR LENS PLACEMENT (IOC);  Surgeon: [name], MD;   Location: [place];  Service: Ophthalmology;  Laterality: Right;  . COLONOSCOPY W/ POLYPECTOMY     bleed after-had to go to surgery to stop bleeding via colonoscopy  . COLONOSCOPY WITH PROPOFOL N/A 11/19/2015   Procedure: COLONOSCOPY WITH PROPOFOL;  Surgeon: [name], MD;  Location[facility];  Service: Endoscopy;  Laterality: N/A;  . DUPUYTREN CONTRACTURE RELEASE  12/15/2011   Procedure: DUPUYTREN CONTRACTURE RELEASE;  Surgeon: [name], MD;  Location: [place];  Service: Orthopedics;  Laterality: Left;  Fasciotomy left ring finger dupuytrens  . ELECTROMAGNETIC NAVIGATION BROCHOSCOPY Left 09/19/2019   Procedure: ELECTROMAGNETIC NAVIGATION BRONCHOSCOPY;  Surgeon: Tyler Pita, MD;  Location: [facility];  Service: Cardiopulmonary;  Laterality: Left;  . FASCIECTOMY Right 11/07/2018   Procedure: SEGMENTAL FASCIECTOMY RIGHT RING FINGER;  Surgeon: Daryll Brod, MD;  Location: [place];  Service: Orthopedics;  Laterality: Right;  AXILLARY BLOCK  . IR ANGIOGRAM SELECTIVE EACH ADDITIONAL VESSEL  07/26/2017  . IR ANGIOGRAM SELECTIVE EACH ADDITIONAL VESSEL  07/26/2017  . IR ANGIOGRAM SELECTIVE EACH ADDITIONAL VESSEL  07/26/2017  . IR ANGIOGRAM SELECTIVE EACH ADDITIONAL VESSEL  07/26/2017  . IR ANGIOGRAM SELECTIVE EACH ADDITIONAL VESSEL  07/26/2017  . IR ANGIOGRAM SELECTIVE EACH ADDITIONAL VESSEL  08/11/2017  . IR ANGIOGRAM SELECTIVE EACH ADDITIONAL VESSEL  04/28/2018  . IR ANGIOGRAM VISCERAL SELECTIVE  07/26/2017  . IR ANGIOGRAM VISCERAL SELECTIVE  08/11/2017  . IR ANGIOGRAM VISCERAL SELECTIVE  04/28/2018  . IR EMBO ARTERIAL NOT HEMORR HEMANG INC GUIDE ROADMAPPING  07/26/2017  . IR EMBO TUMOR ORGAN ISCHEMIA INFARCT INC GUIDE ROADMAPPING  08/11/2017  . IR EMBO TUMOR ORGAN ISCHEMIA INFARCT INC GUIDE ROADMAPPING  04/28/2018  . IR RADIOLOGIST EVAL & MGMT  07/07/2017  . IR RADIOLOGIST EVAL & MGMT  09/07/2017  . IR RADIOLOGIST EVAL & MGMT  12/21/2017  . IR  RADIOLOGIST EVAL & MGMT  03/22/2018  . IR RADIOLOGIST EVAL & MGMT  06/08/2018  . IR RADIOLOGIST EVAL & MGMT  11/01/2018  . IR RADIOLOGIST EVAL & MGMT  03/21/2019  . IR US GUIDE VASC ACCESS RIGHT  07/26/2017  . IR US GUIDE VASC ACCESS RIGHT  08/11/2017  . IR US GUIDE VASC ACCESS RIGHT  04/28/2018  . LUNG LOBECTOMY  06/10/13   upper left lung  . MULTIPLE TOOTH EXTRACTIONS    . REMOVAL RETAINED LENS Right 11/17/2015   Procedure: REMOVAL RETAINED LENS FROAGMENTS RIGHT EYE;  Surgeon: [name], MD;  Location: [place];  Service: Ophthalmology;  Laterality: Right;  . TONSILLECTOMY    . UPPER GASTROINTESTINAL ENDOSCOPY  11-03-15   Dr [name]  . VASECTOMY    . VIDEO ASSISTED THORACOSCOPY (VATS)/WEDGE RESECTION Left 06/04/2013   Procedure: VIDEO ASSISTED THORACOSCOPY (VATS)/WEDGE RESECTION;  Surgeon: [name], MD;  Location: [place];  Service: Thoracic;  Laterality: Left;  [name] VIDEO BRONCHOSCOPY N/A 06/04/2013   Procedure: VIDEO BRONCHOSCOPY;  Surgeon: [name], MD;  [facility];  Service: Thoracic;  Laterality: N/A;    FAMILY HISTORY: Family History  Problem Relation Age of Onset  . Stroke Mother   . Alzheimer's disease Mother   . Hypertension Sister   .  Hyperlipidemia Sister   . Hypertension Brother   . Hyperlipidemia Brother   . Diabetes Brother     ADVANCED DIRECTIVES (Y/N):  N  HEALTH MAINTENANCE: Social History   Tobacco Use  . Smoking status: Former Smoker    Packs/day: 1.00    Years: 40.00    Pack years: 40.00    Types: Cigarettes    Quit date: 12/09/1998    Years since quitting: 20.8  . Smokeless tobacco: Never Used  Substance Use Topics  . Alcohol use: No  . Drug use: No     Colonoscopy:  PAP:  Bone density:  Lipid panel:  Allergies  Allergen Reactions  . Tape Itching    Surgical tapes     Current Outpatient Medications  Medication Sig Dispense Refill  . amLODipine (NORVASC) 2.5 MG tablet Take 1 tablet by mouth once daily  90 tablet 3  . aspirin EC 81 MG tablet Take 81 mg by mouth daily.    [name] atorvastatin (LIPITOR) 40 MG tablet TAKE 1 TABLET BY MOUTH ONCE DAILY FOR CHOLESTEROL 90 tablet 1  . b complex vitamins capsule Take 1 capsule by mouth daily.    . cholecalciferol (VITAMIN D3) 25 MCG (1000 UNIT) tablet Take 1,000 Units by mouth daily.    . Cinnamon 500 MG capsule Take 500 mg by mouth at bedtime.     . diphenhydrAMINE (BENADRYL) 25 mg capsule Take 25 mg by mouth every morning.     [name] DM-APAP-CPM (CORICIDIN HBP PO) Take 2 tablets by mouth daily as needed (allergies/cold symptoms).    . finasteride (PROSCAR) 5 MG tablet Take 1 tablet (5 mg total) by mouth daily. 90 tablet 1  . Glucose Blood (BLOOD GLUCOSE TEST STRIPS) STRP Use as directed to monitor FSBS once daily. Dx: E11.9. 50 strip 1  . losartan (COZAAR) 100 MG tablet Take 1 tablet (100 mg total) by mouth daily. 90 tablet 1  . Multiple Vitamins-Minerals (MULTIVITAMIN WITH MINERALS) tablet Take 1 tablet by mouth daily.     . polycarbophil (FIBERCON) 625 MG tablet Take 625 mg by mouth 2 (two) times daily.     . sodium chloride (OCEAN) 0.65 % SOLN nasal spray Place 1 spray into both nostrils as needed for congestion.    . tamsulosin (FLOMAX) 0.4 MG CAPS capsule Take 2 capsules (0.8 mg total) by mouth at bedtime. 180 capsule 3  . vitamin C (ASCORBIC ACID) 500 MG tablet Take 500 mg by mouth daily.     [name] zinc sulfate 220 (50 Zn) MG capsule Take 1 capsule (220 mg total) by mouth daily. 30 capsule 0   No current facility-administered medications for this visit.   Facility-Administered Medications Ordered in Other Visits  Medication Dose Route Frequency Provider Last Rate Last Admin  . lanreotide acetate (SOMATULINE DEPOT) injection 120 mg  120 mg Subcutaneous Once Lloyd Huger, MD        OBJECTIVE: Vitals:   10/09/19 0940  BP: 126/73  Pulse: 69  Resp: 20  Temp: (!) 97.3 F (36.3 C)  SpO2: 99%     Body mass index is 25.7 kg/m.    ECOG FS:1 -  Symptomatic but completely ambulatory  General: Well-developed, well-nourished, no acute distress. Eyes: Pink conjunctiva, anicteric sclera. HEENT: Normocephalic, moist mucous membranes. Lungs: No audible wheezing or coughing. Heart: Regular rate and rhythm. Abdomen: Soft, nontender, no obvious distention. Musculoskeletal: No edema, cyanosis, or clubbing. Neuro: Alert, answering all questions appropriately. Cranial nerves grossly intact. Skin: No rashes or petechiae  noted. Psych: Normal affect.   LAB RESULTS:  Lab Results  Component Value Date   NA 141 10/09/2019   K 4.1 10/09/2019   CL 106 10/09/2019   CO2 27 10/09/2019   GLUCOSE 93 10/09/2019   BUN 24 (H) 10/09/2019   CREATININE 0.79 10/09/2019   CALCIUM 9.3 10/09/2019   PROT 6.8 10/09/2019   ALBUMIN 3.9 10/09/2019   AST 29 10/09/2019   ALT 36 10/09/2019   ALKPHOS 143 (H) 10/09/2019   BILITOT 0.5 10/09/2019   GFRNONAA >60 10/09/2019   GFRAA >60 10/09/2019    Lab Results  Component Value Date   WBC 10.9 (H) 10/09/2019   NEUTROABS 8.3 (H) 10/09/2019   HGB 15.0 10/09/2019   HCT 45.2 10/09/2019   MCV 90.8 10/09/2019   PLT 188 10/09/2019     STUDIES: DG Chest Port 1 View  Result Date: 09/19/2019 CLINICAL DATA:  Post biopsy and cryoablation EXAM: PORTABLE CHEST 1 VIEW COMPARISON:  CT 08/15/2019, radiograph 07/03/2019 FINDINGS: Diffuse scarring and architectural distortion throughout the right lung with a predominantly peripheral distribution. Improved consolidation in the right lung base when compared to prior imaging. Postsurgical changes from prior left upper lobectomy and additional scarring in the left lung base are similar to priors. No new consolidation. No new intrabronchial lesion is difficult to visualize on radiography though there is some abrupt truncation of the left mainstem bronchus. No pneumothorax or visible effusion. The aorta is calcified. The remaining cardiomediastinal contours are unremarkable. No  acute osseous or soft tissue abnormality. Degenerative changes are present in the imaged spine and shoulders. IMPRESSION: Diffuse scarring and architectural distortion throughout the right lung and left lung base, similar to priors. Improved consolidation in the right lung base when compared to prior imaging. No new consolidation. Known left endobronchial lesion difficult to visualize though there is some apparent truncation on this radiograph. No pneumothorax. Aortic Atherosclerosis (ICD10-I70.0). Electronically Signed   By: Lovena Le M.D.   On: 09/19/2019 16:29    ASSESSMENT: 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  PLAN:    1. 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 Patient underwent left upper lobe lobectomy in [place] on June 04, 2013. Final pathology results revealed a well differentiated neuroendocrine tumor with positive bronchial margins. Patient did not receive adjuvant XRT or chemotherapy at that time.  Patient's most recent abdominal MRI on March 15, 2019 reviewed independently with stable to decrease in size of known lesions in patient's liver.  His most recent Y 90 ablation occurred on April 28, 2018.  Patient's chromogranin a levels are elevated, but relatively stable ranging from 213-261.  Proceed with 120 mg subcutaneous lanreotide today.  Return to clinic in 4 weeks for treatment only and then in 8 weeks for further evaluation and continuation of lanreotide.    2.  Left mainstem endobronchial mass: Consistent with neuroendocrine tumor.  Patient underwent ablation on Sep 19, 2019 and has a second procedure scheduled for October 10, 2019.  Will discuss with pulmonology as to when radiology is appropriate.   3.  Covid: Resolved.  4.  Urinary retention: Foley has been removed.    I spent a total of 30 minutes reviewing chart data, face-to-face  evaluation with the patient, counseling and coordination of care as detailed above.   Patient expressed understanding and was in agreement with this plan. He also understands that He can call clinic  at any time with any questions, concerns, or complaints.   Cancer Staging Neuroendocrine carcinoma of lung [facility]) Staging form: Lung, AJCC 8th Edition - Clinical stage from 12/28/2016: Stage IVA (cT2a, cN0, cM1b) - Signed by Lloyd Huger, MD on 12/28/2016   Lloyd Huger, MD   10/10/2019 6:26 AM

## 2019-10-09 ENCOUNTER — Encounter: Payer: Self-pay | Admitting: Oncology

## 2019-10-09 ENCOUNTER — Inpatient Hospital Stay: Payer: Medicare Other | Attending: Oncology

## 2019-10-09 ENCOUNTER — Inpatient Hospital Stay: Payer: Medicare Other

## 2019-10-09 ENCOUNTER — Other Ambulatory Visit: Payer: Self-pay

## 2019-10-09 ENCOUNTER — Other Ambulatory Visit
Admission: RE | Admit: 2019-10-09 | Discharge: 2019-10-09 | Disposition: A | Discharge status: Home / Self Care | Payer: Medicare Other | Source: Ambulatory Visit | Attending: Pulmonary Disease | Admitting: Pulmonary Disease

## 2019-10-09 ENCOUNTER — Inpatient Hospital Stay (HOSPITAL_BASED_OUTPATIENT_CLINIC_OR_DEPARTMENT_OTHER): Payer: Medicare Other | Admitting: Oncology

## 2019-10-09 VITALS — BP 126/73 | HR 69 | Temp 97.3°F | Resp 20 | Wt 149.7 lb

## 2019-10-09 DIAGNOSIS — R918 Other nonspecific abnormal finding of lung field: Secondary | ICD-10-CM | POA: Diagnosis not present

## 2019-10-09 DIAGNOSIS — R7989 Other specified abnormal findings of blood chemistry: Secondary | ICD-10-CM | POA: Diagnosis not present

## 2019-10-09 DIAGNOSIS — R338 Other retention of urine: Secondary | ICD-10-CM | POA: Diagnosis not present

## 2019-10-09 DIAGNOSIS — N401 Enlarged prostate with lower urinary tract symptoms: Secondary | ICD-10-CM | POA: Diagnosis not present

## 2019-10-09 DIAGNOSIS — C7A8 Other malignant neuroendocrine tumors: Secondary | ICD-10-CM | POA: Insufficient documentation

## 2019-10-09 DIAGNOSIS — C787 Secondary malignant neoplasm of liver and intrahepatic bile duct: Secondary | ICD-10-CM | POA: Diagnosis not present

## 2019-10-09 DIAGNOSIS — Z01812 Encounter for preprocedural laboratory examination: Secondary | ICD-10-CM | POA: Insufficient documentation

## 2019-10-09 DIAGNOSIS — C7B8 Other secondary neuroendocrine tumors: Secondary | ICD-10-CM

## 2019-10-09 DIAGNOSIS — Z79899 Other long term (current) drug therapy: Secondary | ICD-10-CM | POA: Insufficient documentation

## 2019-10-09 DIAGNOSIS — Z20822 Contact with and (suspected) exposure to covid-19: Secondary | ICD-10-CM | POA: Diagnosis not present

## 2019-10-09 DIAGNOSIS — Z8616 Personal history of COVID-19: Secondary | ICD-10-CM | POA: Insufficient documentation

## 2019-10-09 LAB — COMPREHENSIVE METABOLIC PANEL
ALT: 36 U/L (ref 0–44)
AST: 29 U/L (ref 15–41)
Albumin: 3.9 g/dL (ref 3.5–5.0)
Alkaline Phosphatase: 143 U/L — ABNORMAL HIGH (ref 38–126)
Anion gap: 8 (ref 5–15)
BUN: 24 mg/dL — ABNORMAL HIGH (ref 8–23)
CO2: 27 mmol/L (ref 22–32)
Calcium: 9.3 mg/dL (ref 8.9–10.3)
Chloride: 106 mmol/L (ref 98–111)
Creatinine, Ser: 0.79 mg/dL (ref 0.61–1.24)
GFR calc Af Amer: >60 mL/min (ref >60)
GFR calc non Af Amer: >60 mL/min (ref >60)
Glucose, Bld: 93 mg/dL (ref 70–99)
Potassium: 4.1 mmol/L (ref 3.5–5.1)
Sodium: 141 mmol/L (ref 135–145)
Total Bilirubin: 0.5 mg/dL (ref 0.3–1.2)
Total Protein: 6.8 g/dL (ref 6.5–8.1)

## 2019-10-09 LAB — CBC WITH DIFFERENTIAL/PLATELET
Abs Immature Granulocytes: 0.03 10*3/uL (ref 0.00–0.07)
Basophils Absolute: 0.1 10*3/uL (ref 0.0–0.1)
Basophils Relative: 1 %
Eosinophils Absolute: 0.2 10*3/uL (ref 0.0–0.5)
Eosinophils Relative: 2 %
HCT: 45.2 % (ref 39.0–52.0)
Hemoglobin: 15 g/dL (ref 13.0–17.0)
Immature Granulocytes: 0 %
Lymphocytes Relative: 7 %
Lymphs Abs: 0.8 10*3/uL (ref 0.7–4.0)
MCH: 30.1 pg (ref 26.0–34.0)
MCHC: 33.2 g/dL (ref 30.0–36.0)
MCV: 90.8 fL (ref 80.0–100.0)
Monocytes Absolute: 1.5 10*3/uL — ABNORMAL HIGH (ref 0.1–1.0)
Monocytes Relative: 14 %
Neutro Abs: 8.3 10*3/uL — ABNORMAL HIGH (ref 1.7–7.7)
Neutrophils Relative %: 76 %
Platelets: 188 10*3/uL (ref 150–400)
RBC: 4.98 MIL/uL (ref 4.22–5.81)
RDW: 13.5 % (ref 11.5–15.5)
WBC: 10.9 10*3/uL — ABNORMAL HIGH (ref 4.0–10.5)
nRBC: 0 % (ref 0.0–0.2)

## 2019-10-09 LAB — SARS CORONAVIRUS 2 (TAT 6-24 HRS): SARS Coronavirus 2: NEGATIVE

## 2019-10-09 MED ORDER — LANREOTIDE ACETATE 120 MG/0.5ML ~~LOC~~ SOLN
120.0000 mg | Freq: Once | SUBCUTANEOUS | Status: AC
  Administered 2019-10-09: 120 mg via SUBCUTANEOUS
  Filled 2019-10-09: qty 120

## 2019-10-09 NOTE — Progress Notes (Signed)
Patient here today for follow up. Denies pain or concerns at this time.

## 2019-10-10 ENCOUNTER — Other Ambulatory Visit: Payer: Self-pay

## 2019-10-10 ENCOUNTER — Ambulatory Visit: Payer: Medicare Other | Admitting: Family Medicine

## 2019-10-10 ENCOUNTER — Other Ambulatory Visit: Payer: Self-pay | Admitting: Oncology

## 2019-10-10 ENCOUNTER — Ambulatory Visit: Payer: Medicare Other | Admitting: Certified Registered"

## 2019-10-10 ENCOUNTER — Encounter: Payer: Self-pay | Admitting: Pulmonary Disease

## 2019-10-10 ENCOUNTER — Ambulatory Visit
Admission: RE | Admit: 2019-10-10 | Discharge: 2019-10-10 | Disposition: A | Discharge status: Home / Self Care | Payer: Medicare Other | Attending: Pulmonary Disease | Admitting: Pulmonary Disease

## 2019-10-10 ENCOUNTER — Encounter
Admission: RE | Disposition: A | Discharge status: Home / Self Care | Payer: Self-pay | Source: Home / Self Care | Attending: Pulmonary Disease

## 2019-10-10 DIAGNOSIS — Z79899 Other long term (current) drug therapy: Secondary | ICD-10-CM | POA: Insufficient documentation

## 2019-10-10 DIAGNOSIS — Z87891 Personal history of nicotine dependence: Secondary | ICD-10-CM | POA: Insufficient documentation

## 2019-10-10 DIAGNOSIS — E119 Type 2 diabetes mellitus without complications: Secondary | ICD-10-CM | POA: Diagnosis not present

## 2019-10-10 DIAGNOSIS — I10 Essential (primary) hypertension: Secondary | ICD-10-CM | POA: Diagnosis not present

## 2019-10-10 DIAGNOSIS — Z902 Acquired absence of lung [part of]: Secondary | ICD-10-CM | POA: Insufficient documentation

## 2019-10-10 DIAGNOSIS — C787 Secondary malignant neoplasm of liver and intrahepatic bile duct: Secondary | ICD-10-CM | POA: Diagnosis not present

## 2019-10-10 DIAGNOSIS — Z8249 Family history of ischemic heart disease and other diseases of the circulatory system: Secondary | ICD-10-CM | POA: Diagnosis not present

## 2019-10-10 DIAGNOSIS — N401 Enlarged prostate with lower urinary tract symptoms: Secondary | ICD-10-CM | POA: Diagnosis not present

## 2019-10-10 DIAGNOSIS — I4891 Unspecified atrial fibrillation: Secondary | ICD-10-CM | POA: Insufficient documentation

## 2019-10-10 DIAGNOSIS — R338 Other retention of urine: Secondary | ICD-10-CM | POA: Diagnosis not present

## 2019-10-10 DIAGNOSIS — Z7982 Long term (current) use of aspirin: Secondary | ICD-10-CM | POA: Diagnosis not present

## 2019-10-10 DIAGNOSIS — C7B8 Other secondary neuroendocrine tumors: Secondary | ICD-10-CM

## 2019-10-10 DIAGNOSIS — C7A090 Malignant carcinoid tumor of the bronchus and lung: Secondary | ICD-10-CM | POA: Diagnosis not present

## 2019-10-10 DIAGNOSIS — I4892 Unspecified atrial flutter: Secondary | ICD-10-CM | POA: Insufficient documentation

## 2019-10-10 DIAGNOSIS — Z8616 Personal history of COVID-19: Secondary | ICD-10-CM | POA: Insufficient documentation

## 2019-10-10 DIAGNOSIS — M8580 Other specified disorders of bone density and structure, unspecified site: Secondary | ICD-10-CM | POA: Insufficient documentation

## 2019-10-10 DIAGNOSIS — C7A8 Other malignant neuroendocrine tumors: Secondary | ICD-10-CM | POA: Diagnosis present

## 2019-10-10 LAB — GLUCOSE, CAPILLARY: Glucose-Capillary: 96 mg/dL (ref 70–99)

## 2019-10-10 SURGERY — BRONCHOSCOPY, WITH NEOPLASM DESTRUCTION
Anesthesia: General | Laterality: Left

## 2019-10-10 MED ORDER — CHLORHEXIDINE GLUCONATE 0.12 % MT SOLN
15.0000 mL | Freq: Once | OROMUCOSAL | Status: AC
  Administered 2019-10-10: 15 mL via OROMUCOSAL

## 2019-10-10 MED ORDER — EPHEDRINE 5 MG/ML INJ
INTRAVENOUS | Status: AC
  Filled 2019-10-10: qty 10

## 2019-10-10 MED ORDER — ONDANSETRON HCL 4 MG/2ML IJ SOLN: 4.0000 mg | Freq: Once | INTRAMUSCULAR | Status: DC | PRN

## 2019-10-10 MED ORDER — PROPOFOL 10 MG/ML IV BOLUS
INTRAVENOUS | Status: AC
  Filled 2019-10-10: qty 20

## 2019-10-10 MED ORDER — ONDANSETRON HCL 4 MG/2ML IJ SOLN
INTRAMUSCULAR | Status: AC
  Filled 2019-10-10: qty 2

## 2019-10-10 MED ORDER — SODIUM CHLORIDE 0.9 % IV SOLN: INTRAVENOUS | Status: DC | PRN

## 2019-10-10 MED ORDER — ONDANSETRON HCL 4 MG/2ML IJ SOLN
INTRAMUSCULAR | Status: DC | PRN
  Administered 2019-10-10: 4 mg via INTRAVENOUS

## 2019-10-10 MED ORDER — FAMOTIDINE 20 MG PO TABS: 20.0000 mg | ORAL_TABLET | Freq: Once | ORAL | Status: AC

## 2019-10-10 MED ORDER — FENTANYL CITRATE (PF) 100 MCG/2ML IJ SOLN
INTRAMUSCULAR | Status: AC
  Filled 2019-10-10: qty 2

## 2019-10-10 MED ORDER — ROCURONIUM BROMIDE 100 MG/10ML IV SOLN
INTRAVENOUS | Status: DC | PRN
  Administered 2019-10-10: 50 mg via INTRAVENOUS

## 2019-10-10 MED ORDER — SODIUM CHLORIDE 0.9 % IV SOLN: Freq: Once | INTRAVENOUS | Status: AC

## 2019-10-10 MED ORDER — ROCURONIUM BROMIDE 10 MG/ML (PF) SYRINGE
PREFILLED_SYRINGE | INTRAVENOUS | Status: AC
  Filled 2019-10-10: qty 10

## 2019-10-10 MED ORDER — SUGAMMADEX SODIUM 200 MG/2ML IV SOLN
INTRAVENOUS | Status: DC | PRN
  Administered 2019-10-10: 150 mg via INTRAVENOUS

## 2019-10-10 MED ORDER — SUCCINYLCHOLINE CHLORIDE 20 MG/ML IJ SOLN
INTRAMUSCULAR | Status: DC | PRN
  Administered 2019-10-10: 80 mg via INTRAVENOUS

## 2019-10-10 MED ORDER — FENTANYL CITRATE (PF) 100 MCG/2ML IJ SOLN: 25.0000 ug | INTRAMUSCULAR | Status: DC | PRN

## 2019-10-10 MED ORDER — BUTAMBEN-TETRACAINE-BENZOCAINE 2-2-14 % EX AERO
1.0000 | INHALATION_SPRAY | Freq: Once | CUTANEOUS | Status: DC
  Filled 2019-10-10 (×2): qty 20

## 2019-10-10 MED ORDER — FAMOTIDINE 20 MG PO TABS
ORAL_TABLET | ORAL | Status: AC
  Administered 2019-10-10: 20 mg via ORAL
  Filled 2019-10-10: qty 1

## 2019-10-10 MED ORDER — CHLORHEXIDINE GLUCONATE 0.12 % MT SOLN
OROMUCOSAL | Status: AC
  Filled 2019-10-10: qty 15

## 2019-10-10 MED ORDER — PROPOFOL 10 MG/ML IV BOLUS
INTRAVENOUS | Status: DC | PRN
  Administered 2019-10-10: 120 mg via INTRAVENOUS

## 2019-10-10 MED ORDER — LIDOCAINE HCL (CARDIAC) PF 100 MG/5ML IV SOSY
PREFILLED_SYRINGE | INTRAVENOUS | Status: DC | PRN
  Administered 2019-10-10: 60 mg via INTRAVENOUS
  Administered 2019-10-10: 40 mg via INTRAVENOUS

## 2019-10-10 MED ORDER — OXYCODONE HCL 5 MG PO TABS: 5.0000 mg | ORAL_TABLET | Freq: Once | ORAL | Status: DC | PRN

## 2019-10-10 MED ORDER — OXYCODONE HCL 5 MG/5ML PO SOLN: 5.0000 mg | Freq: Once | ORAL | Status: DC | PRN

## 2019-10-10 MED ORDER — DEXAMETHASONE SODIUM PHOSPHATE 10 MG/ML IJ SOLN
INTRAMUSCULAR | Status: DC | PRN
  Administered 2019-10-10: 10 mg via INTRAVENOUS

## 2019-10-10 MED ORDER — FENTANYL CITRATE (PF) 100 MCG/2ML IJ SOLN
INTRAMUSCULAR | Status: DC | PRN
  Administered 2019-10-10 (×2): 50 ug via INTRAVENOUS

## 2019-10-10 MED ORDER — ACETAMINOPHEN 10 MG/ML IV SOLN: 1000.0000 mg | Freq: Once | INTRAVENOUS | Status: DC | PRN

## 2019-10-10 MED ORDER — ORAL CARE MOUTH RINSE: 15.0000 mL | Freq: Once | OROMUCOSAL | Status: AC

## 2019-10-10 MED ORDER — SEVOFLURANE IN SOLN
RESPIRATORY_TRACT | Status: AC
  Filled 2019-10-10: qty 250

## 2019-10-10 MED ORDER — DEXAMETHASONE SODIUM PHOSPHATE 10 MG/ML IJ SOLN
INTRAMUSCULAR | Status: AC
  Filled 2019-10-10: qty 1

## 2019-10-10 MED ORDER — EPHEDRINE SULFATE 50 MG/ML IJ SOLN
INTRAMUSCULAR | Status: DC | PRN
  Administered 2019-10-10: 5 mg via INTRAVENOUS
  Administered 2019-10-10: 10 mg via INTRAVENOUS

## 2019-10-10 NOTE — Interval H&P Note (Signed)
History and Physical Interval Note:   History and Physical Interval Note:   10/10/2019 12:49 PM   The patient underwent the first of a STAGED bronchoscopy cryo ablation procedure for recurrent tumor of the LEFT mainstem bronchus on 19 Sep 2019.  Patient did well after that procedure and actually has noted marked improvement in his breathing.  He presents today for follow-up cryoablation of tumor remnants.  There have been no new issues in the interim.  Physical exam today shows clear lung sounds bilaterally and no other changes from prior.   Jason Malone  has presented today for surgery, with the diagnosis of carcinoid tumor.  The various methods of treatment have been discussed with the patient and family. After consideration of risks, benefits and other options for treatment, the patient has consented to  Procedure(s): THERAPEUTIC SPRAY CRYOTHERAPY WITH TUMOR DESTRUCTION (Left) as a surgical intervention.  The patient's history has been reviewed, patient examined, no change in status, stable for surgery.  I have reviewed the patient's chart and labs.  Questions were answered to the patient's satisfaction.           Renold Don, MD [place] [facility]   *This note was dictated using voice recognition software/Dragon.  Despite best efforts to proofread, errors can occur which can change the meaning.  Any change was purely unintentional.

## 2019-10-10 NOTE — Transfer of Care (Signed)
Immediate Anesthesia Transfer of Care Note  Patient: Jason Malone  Procedure(s) Performed: THERAPEUTIC SPRAY CRYOTHERAPY WITH TUMOR DESTRUCTION (Left )  Patient Location: PACU  Anesthesia Type:General  Level of Consciousness: awake, alert  and oriented  Airway & Oxygen Therapy: Patient Spontanous Breathing  Post-op Assessment: Report given to RN and Post -op Vital signs reviewed and stable  Post vital signs: Reviewed and stable  Last Vitals:  Vitals Value Taken Time  BP 115/61 10/10/19 1412  Temp    Pulse 74 10/10/19 1413  Resp 17 10/10/19 1413  SpO2 97 % 10/10/19 1413  Vitals shown include unvalidated device data.  Last Pain:  Vitals:   10/10/19 1107  TempSrc: Temporal  PainSc: 0-No pain         Complications: No apparent anesthesia complications

## 2019-10-10 NOTE — Interval H&P Note (Signed)
History and Physical Interval Note:   History and Physical Interval Note:   10/10/2019 12:49 PM   The patient underwent the first of a STAGED bronchoscopy cryo ablation procedure for recurrent tumor of the LEFT mainstem bronchus on 19 Sep 2019.  Patient did well after that procedure and actually has noted marked improvement in his breathing.  He presents today for follow-up cryoablation of tumor remnants.  There have been no new issues in the interim.  Physical exam today shows clear lung sounds bilaterally and no other changes from prior.   Jason Malone  has presented today for surgery, with the diagnosis of carcinoid tumor.  The various methods of treatment have been discussed with the patient and family. After consideration of risks, benefits and other options for treatment, the patient has consented to  Procedure(s): THERAPEUTIC SPRAY CRYOTHERAPY WITH TUMOR DESTRUCTION (Left) as a surgical intervention.  The patient's history has been reviewed, patient examined, no change in status, stable for surgery.  I have reviewed the patient's chart and labs.  Questions were answered to the patient's satisfaction.           Renold Don, MD [facility]   *This note was dictated using voice recognition software/Dragon.  Despite best efforts to proofread, errors can occur which can change the meaning.  Any change was purely unintentional.

## 2019-10-10 NOTE — Anesthesia Preprocedure Evaluation (Signed)
Anesthesia Evaluation  Patient identified by MRN, date of birth, ID band Patient awake    Reviewed: Allergy & Precautions, H&P , NPO status , Patient's Chart, lab work & pertinent test results, reviewed documented beta blocker date and time   Airway Mallampati: I  TM Distance: >3 FB Neck ROM: full    Dental  (+) Edentulous Upper, Edentulous Lower   Pulmonary shortness of breath and with exertion, neg sleep apnea, pneumonia, resolved, neg COPD, Recent URI  (Had COVID and is still SOB), Patient abstained from smoking., former smoker,  S/p LULobectomy   Pulmonary exam normal  + decreased breath sounds      Cardiovascular Exercise Tolerance: Good hypertension, On Home Beta Blockers and On Medications (-) angina(-) CAD, (-) Past MI, (-) Cardiac Stents and (-) CABG Normal cardiovascular exam+ dysrhythmias Atrial Fibrillation (-) Valvular Problems/Murmurs Rhythm:regular Rate:Normal     Neuro/Psych negative neurological ROS  negative psych ROS   GI/Hepatic negative GI ROS, Neg liver ROS,   Endo/Other  diabetes, Type 2  Renal/GU Renal disease  negative genitourinary   Musculoskeletal   Abdominal   Peds  Hematology negative hematology ROS (+)   Anesthesia Other Findings Past Medical History:   Full dentures                                                  Comment:upper and lower   Wears glasses                                                Enlarged prostate                                            Hypomagnesemia                                               H/O hypokalemia                                              Diverticulitis                                                 Comment:DIVERTICULOSIS   Hyperlipemia                                                 Paroxysmal atrial flutter (HCC)                                Comment:a. 03/2013->no recurrence;  b. CHA2DS2VASc =  2-->not currently on  anticoagulation;  c.               05/2012 Echo: EF 55-60%, normal RV.   Allergic rhinitis                                            IFG (impaired fasting glucose)                               Monocytosis                                                  Benign prostatic hypertrophy with lower urinar*              Chest pain                                                     Comment:a. 05/2013 MV: EF 70%, no ischemia.   Lung cancer ([place])                                              Comment:a. carcinoid, left lung, Stage 1b (T2a, N0,               cM0);  b. 05/2013 s/p VATS & LULobectomy.   Essential hypertension                                         Comment:CONTROLLED ON MEDS   Hx of atrial fibrillation without current medi*                Comment:CARDIOLOGIST-DR GOLLAN/ [facility]   Reproductive/Obstetrics negative OB ROS                             Anesthesia Physical  Anesthesia Plan  ASA: III  Anesthesia Plan: General   Post-op Pain Management:    Induction: Intravenous  PONV Risk Score and Plan: 2 and Ondansetron, Dexamethasone and Treatment may vary due to age or medical condition  Airway Management Planned: Oral ETT  Additional Equipment: None  Intra-op Plan:   Post-operative Plan: Extubation in OR and Possible Post-op intubation/ventilation  Informed Consent: I have reviewed the patients History and Physical, chart, labs and discussed the procedure including the risks, benefits and alternatives for the proposed anesthesia with the patient or authorized representative who has indicated his/her understanding and acceptance.     Dental Advisory Given  Plan Discussed with: Anesthesiologist, CRNA and Surgeon  Anesthesia Plan Comments: (Discussed risks of anesthesia with patient, including PONV, sore throat, lip/dental damage. Rare risks discussed as well, such as cardiorespiratory and neurological sequelae. Patient understands.)         Anesthesia Quick Evaluation

## 2019-10-10 NOTE — Interval H&P Note (Deleted)
History and Physical Interval Note:  10/10/2019 12:49 PM  The patient underwent the first of a STAGED bronchoscopy cryo ablation procedure for recurrent tumor of the LEFT mainstem bronchus on 19 Sep 2019.  Patient did well after that procedure and actually has noted marked improvement in his breathing.  He presents today for follow-up cryoablation of tumor remnants.  There have been no new issues in the interim.  Physical exam today shows clear lung sounds bilaterally and no other changes from prior.  Zayvien W Mccleod  has presented today for surgery, with the diagnosis of carcinoid tumor.  The various methods of treatment have been discussed with the patient and family. After consideration of risks, benefits and other options for treatment, the patient has consented to  Procedure(s): THERAPEUTIC SPRAY CRYOTHERAPY WITH TUMOR DESTRUCTION (Left) as a surgical intervention.  The patient's history has been reviewed, patient examined, no change in status, stable for surgery.  I have reviewed the patient's chart and labs.  Questions were answered to the patient's satisfaction.       Renold Don, MD [facility]   *This note was dictated using voice recognition software/Dragon.  Despite best efforts to proofread, errors can occur which can change the meaning.  Any change was purely unintentional.

## 2019-10-10 NOTE — Anesthesia Procedure Notes (Signed)
Procedure Name: Intubation Date/Time: 10/10/2019 1:31 PM Performed by: Jonna Clark, CRNA Pre-anesthesia Checklist: Patient identified, Patient being monitored, Timeout performed, Emergency Drugs available and Suction available Patient Re-evaluated:Patient Re-evaluated prior to induction Oxygen Delivery Method: Circle system utilized Preoxygenation: Pre-oxygenation with 100% oxygen Induction Type: IV induction Ventilation: Mask ventilation without difficulty Laryngoscope Size: 3 and McGraph Grade View: Grade I Tube type: Oral Tube size: 9.0 mm Number of attempts: 1 Airway Equipment and Method: Stylet Placement Confirmation: ETT inserted through vocal cords under direct vision,  positive ETCO2 and breath sounds checked- equal and bilateral Secured at: 22 cm Tube secured with: Tape Dental Injury: Teeth and Oropharynx as per pre-operative assessment

## 2019-10-10 NOTE — Interval H&P Note (Signed)
History and Physical Interval Note:   History and Physical Interval Note:   10/10/2019 12:49 PM   The patient underwent the first of a STAGED bronchoscopy cryo ablation procedure for recurrent tumor of the LEFT mainstem bronchus on 19 Sep 2019.  Patient did well after that procedure and actually has noted marked improvement in his breathing.  He presents today for follow-up cryoablation of tumor remnants.  There have been no new issues in the interim.  Physical exam today shows clear lung sounds bilaterally and no other changes from prior.   Jason Malone  has presented today for surgery, with the diagnosis of carcinoid tumor.  The various methods of treatment have been discussed with the patient and family. After consideration of risks, benefits and other options for treatment, the patient has consented to  Procedure(s): THERAPEUTIC SPRAY CRYOTHERAPY WITH TUMOR DESTRUCTION (Left) as a surgical intervention.  The patient's history has been reviewed, patient examined, no change in status, stable for surgery.  I have reviewed the patient's chart and labs.  Questions were answered to the patient's satisfaction.           Renold Don, MD [facility]   *This note was dictated using voice recognition software/Dragon.  Despite best efforts to proofread, errors can occur which can change the meaning.  Any change was purely unintentional.

## 2019-10-10 NOTE — Op Note (Signed)
PROCEDURE: BRONCHOSCOPY with Cryoablation of tumor and tumor destruction, LEFT mainstem bronchus.   PROCEDURE DATE: 10/10/2019  TIME:  NAME:  Jason Malone  DOB:1940-06-05  MRN: [id number] LOC:  ARPO/None    HOSP DAY: @LENGTHOFSTAYDAYS @ CODE STATUS:   Code Status History    Date Active Date Inactive Code Status Order ID Comments User Context   07/03/2019 0644 07/06/2019 1936 Full Code [id number]  Shela Leff, MD ED   06/14/2019 1054 06/17/2019 1537 Full Code [id number]  Allie Bossier, MD Inpatient   06/10/2019 2352 06/14/2019 1008 Full Code [id number]  Anda Latina, MD ED   06/04/2013 1642 06/10/2013 1753 Full Code [id number]  Caryn Bee Inpatient   Advance Care Planning Activity        Indications/Preliminary Diagnosis:   1.  Recurrent carcinoid tumor, left mainstem bronchus 2.  Subsequent cryoablation for control of tumor growth  Proposed procedure: Bronchoscopy with cryoablation of tumor and tumor destruction    Consent: (Place X beside choice/s below)  The benefits, risks and possible complications of the procedure were        explained to:  _X__ patient  ___ patient's family  ___ other:___________  who verbalized understanding and gave:  ___ verbal  ___ written  _X_ verbal and written  ___ telephone  ___ other:________ consent.      Unable to obtain consent; procedure performed on emergent basis.     Other:    Benefits, limitations and potential complications of the procedure were discussed with the patient/family  including, but not limited to bleeding, hemoptysis, respiratory failure requiring intubation and/or prolongued mechanical ventilation, infection, pneumothorax (collapse of lung) requiring chest tube placement, stroke from air embolism or even death.  Patient is aware that procedure will be done under general anesthesia.  Patient is also aware of the potential complications from spray cryotherapy.  Patient agrees to proceed.    PRESEDATION ASSESSMENT:  History and Physical has been performed. Patient meds and allergies have been reviewed. Presedation airway examination has been performed and documented. Baseline vital signs, sedation score, oxygenation status, and cardiac rhythm were reviewed. Patient was deemed to be in satisfactory condition to undergo the procedure.    Type of anesthesia: General endotracheal  Operator: Renold Don, MD Assistant/scrub: Liborio Nixon, RRT Circulator: Annia Belt, RRT Anesthesiologist/CRNA: Arita Miss, MD/Janice Rise Patience, CRNA [facility] present   PROCEDURE DETAILS:The patient was taken to Procedure Room 2 (Bronchoscopy Suite) where appropriate timeout was performed and correct patient, name, & ID confirmed.  Patient was inducted under general anesthesia and intubated by anesthesiologist/CRNA.  He was intubated with an #9.0 ETT to allow for passive venting during spray cryotherapy.  A Portex adapter was placed on the flange of the ET tube.  Utilizing the Portex adapter to introduce the bronchoscope, anatomic tour of the bronchial tree was performed with the Olympus therapeutic video bronchoscope.  The right bronchial tree showed no endobronchial lesions.  On the left there was studding of the left mainstem bronchus mucosa at the site of prior cryoablation and tumor destruction.  There was a small polypoid lesion still attached to the suture line of the prior left upper lobe resection. At this point we proceeded to perform cryoablation of the tumor studded mucosa and the polypoid lesion with the truFreeze spray cryotherapy probe.  The patient received 3 cycles of 5-second total freeze to the lesions. Passive venting was allowed during the cycles.  Close monitoring of the patient checking for chest rise etc. during the spray procedure was  performed by the circulator.  CRNA managed time off anesthesia circuit.  Once this was completed the lesion was blanched with 3 mL of 1-10,000 epinephrine.  A 10 mm [employer] "Captivator" snare was then utilized to attempt capture the polypoid mass the snare however was too large and the mass was then resected in piecemeal fashion with ConMed Precisor forceps.  The mass was placed in formalin for histologic analysis.  Once this was removed, 1 cycle of 5-second total freeze of the lesion with passive venting was performed on the remainder mass base.  The edges of the bronchial wall were also sprayed at this point.  Careful monitoring of the patient and monitoring of the patient off the anesthesia circuit time was performed as above.  At this point the area was lavaged with normal saline.  Small amounts of blood were suctioned.  The lumen of the LEFT mainstem bronchus remained patent.  The previous surgical suture line was inspected thoroughly and was noted to be undisrupted.  The patient has received maximum treatment for today. Once this was completed the airway was suctioned and lavaged and cleared of any residual heme.  Total blood loss was less than 2 mL.  At this point the patient received a total of 9 mL of 1% lidocaine via bronchial lavage.  The bronchoscope was removed and the procedure was terminated.  Patient was allowed to emerge from general anesthesia and was extubated in the procedure room without sequela.  He was transferred to the PACU in satisfactory condition.    Chest x-ray was not necessary at this point.  The patient was monitored clinically.  The patient tolerated the procedure well.    Insertion Route (Place X beside choice below)   Nasal   Oral  X Endotracheal Tube   Tracheostomy   INTRAPROCEDURE MEDICATIONS:  Medication Amt Dose  Medication Amt Dose  Lidocaine 1%  9  mL  Epinephrine 1:10,000 sol  3  mL  Xylocaine 4% -- cc  Cocaine -- cc   TECHNICAL PROCEDURES: (Place X beside choice below)   Procedures  Description    None     Electrocautery   X Spray cryotherapy     Balloon Dilatation     Bronchography     Stent Placement      Therapeutic Aspiration     Laser/Argon Plasma    Brachytherapy Catheter Placement   X Destruction of tumor        SPECIMENS (Sites): (Place X beside choice below)  Specimens Description   No Specimens Obtained     Washings    Lavage   X  Excised tumor  polypoid mass   Fine Needle Aspirates    Brushings    Sputum    FINDINGS:  Small residual polypoid mass originating from prior suture line, patent LEFT mainstem bronchus with tumor studding at prior excision of tumor site.  Good overall healing from prior cryotherapy.   ESTIMATED BLOOD LOSS: Less than 2 mL  COMPLICATIONS/RESOLUTION: No overt complications   IMPRESSION:POST-PROCEDURE DX:  1.  Recurrent carcinoid tumor, LEFT main-excised 2.  Patent LEFT main bronchus after prior cryoablation 3.  Minimal tumor studding at site of prior cryoablation   RECOMMENDATION/PLAN:  1.  Patient will be reimaged with CT of the chest in 2 weeks 2.  Will likely need follow-up bronchoscopy 3 to 4 weeks 3.  Repeat cryoablation as needed   C. Derrill Kay, MD [facility]   *This note was dictated using voice recognition  software/Dragon.  Despite best efforts to proofread, errors can occur which can change the meaning.  Any change was purely unintentional.

## 2019-10-10 NOTE — Discharge Instructions (Signed)

## 2019-10-11 LAB — SURGICAL PATHOLOGY

## 2019-10-11 LAB — CHROMOGRANIN A: Chromogranin A (ng/mL): 320.8 ng/mL — ABNORMAL HIGH (ref 0.0–101.8)

## 2019-10-11 NOTE — Anesthesia Postprocedure Evaluation (Signed)
Anesthesia Post Note  Patient: Ritter W Situ  Procedure(s) Performed: THERAPEUTIC SPRAY CRYOTHERAPY WITH TUMOR DESTRUCTION (Left )  Patient location during evaluation: PACU Anesthesia Type: General Level of consciousness: awake and alert Pain management: pain level controlled Vital Signs Assessment: post-procedure vital signs reviewed and stable Respiratory status: spontaneous breathing, nonlabored ventilation, respiratory function stable and patient connected to nasal cannula oxygen Cardiovascular status: blood pressure returned to baseline and stable Postop Assessment: no apparent nausea or vomiting Anesthetic complications: no     Last Vitals:  Vitals:   10/10/19 1440 10/10/19 1446  BP: 124/61 (!) 141/59  Pulse: 74 69  Resp: 20 20  Temp:  (!) 35.9 C  SpO2: 97% 98%    Last Pain:  Vitals:   10/11/19 0837  TempSrc:   PainSc: 0-No pain                 Arita Miss

## 2019-10-24 ENCOUNTER — Ambulatory Visit: Payer: Medicare Other | Admitting: Family Medicine

## 2019-10-24 NOTE — Progress Notes (Signed)
Patient ID: Jason Malone, male    DOB: June 01, 1940, 79 y.o.   MRN: [id number]  PCP: Delsa Grana, PA-C  Chief Complaint  Patient presents with  . Follow-up  . Prediabetes  . Hyperlipidemia  . Hypertension    Subjective:   Jason Malone is a 79 y.o. male, presents to clinic with CC of the following:  Chief Complaint  Patient presents with  . Follow-up  . Prediabetes  . Hyperlipidemia  . Hypertension    HPI:  Patient is a 79 year old male patient of Delsa Grana Her last visit with him was 07/10/2019 for a hospitalization follow-up: Pt was admitted for acute respiratory failure with hypoxia secondary to COVID pneumonia noted Follows up today, with his wife with him today  He notes in general he has been feeling improved with no longer needing the supplemental oxygen, but he notes he still gets short of breath when up and about and trying to do things, and also has been having episodes of dizziness in the past 3 to 4 weeks, especially problematic when getting up from a kneeling position or with change of positions.  See more details under hypertension below  He recently followed up with oncology on 10/09/2019, with the following summary noted,  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 -He continues to be followed by oncology INTERVAL HISTORY: He recently had biopsy and ablation of an endobronchial lesion that was consistent with a neuroendocrine tumor.  He has a second procedure scheduled for 10/10/19 and that was completed.  Since his treatment, patient's breathing has significantly improved and he no longer requires oxygen.  He continues to have weakness and fatigue, but admits this is improving as well. He has no neurologic complaints.  He has a good appetite and denies weight loss.  He denies any chest pain, cough, or hemoptysis.  He denies any abdominal pain.  He has no nausea, vomiting, constipation, or diarrhea.  He has no urinary complaints.  Patient  offers no further specific complaints today. A/P from that last visit: PLAN:   1. 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 Patient underwent left upper lobe lobectomy in [place] on June 04, 2013. Final pathology results revealed a well differentiated neuroendocrine tumor with positive bronchial margins. Patient did not receive adjuvant XRT or chemotherapy at that time.  Patient's most recent abdominal MRI on March 15, 2019 reviewed independently with stable to decrease in size of known lesions in patient's liver.  His most recent Y 90 ablation occurred on April 28, 2018.  Patient's chromogranin a levels are elevated, but relatively stable ranging from 213-261.  Proceed with 120 mg subcutaneous lanreotide today.  Return to clinic in 4 weeks for treatment only and then in 8 weeks for further evaluation and continuation of lanreotide.    2.  Left mainstem endobronchial mass: Consistent with neuroendocrine tumor.  Patient underwent ablation on Sep 19, 2019 and has a second procedure scheduled for October 10, 2019.  Will discuss with pulmonology as to when radiology is appropriate.   3.  Covid: Resolved.  4.  Urinary retention: Foley has been removed.   He has also continue to have follow-up with pulmonary involved with the above management. Next appt with oncology and pulm on June 29  Hypertension:  Currently managed on norvasc 2.5 mg losartan 100 mg  Patient is taking medications regularly He does check  his blood pressures at home, are low when gets dizzy spell. 122/76 this am.  Notes his blood pressure is about 120 over 70s before takes meds, then off decreases after takes med. Two weeks ago, took 1/2 losartan for 2 days, and BP was 470'J systolic after lessening the dose and then resumed 100mg  dose.  BP Readings from Last 3 Encounters:  10/25/19 126/76  10/10/19 (!) 141/59  10/09/19  126/73    When gets very active, has occas chest pain across the chest, not more left sided and no radiation, also notes and irreg heart beat at times, has chronic shortness of breath with some improvements recently has followed with oncology and pulmonology, no increased lower extremity swelling, legs at times feel heavy, denied increased headaches, vision changes Notes tries to stay well-hydrated He has seen cardiology in the past, Dr. Rockey Situ, although not in the recent past. His last visit was 03/12/2019, with carvedilol stopped at that visit to 2 bradycardia and orthostasis symptoms, and it was noted if his blood pressure continued to run low, may need to hold the amlodipine. I did recommend calling the office to schedule a follow-up again with cardiology.  Hyperlipidemia: Current Medication Regimen:  lipitor 40 mg  Taking medication regularly Lab Results  Component Value Date   CHOL 83 06/15/2019   HDL 32 (L) 06/15/2019   LDLCALC 42 06/15/2019   TRIG 44 06/15/2019   CHOLHDL 2.6 06/15/2019   Denies increased myalgias  Diabetes Mellitus Type II: Medication regimen-none,  currently managing with lifestyle  he checks his blood sugars, usually run in 120's, denies any really low blood sugars (lowest 96) Denies: Increased thirst, urinates frequently at night due to prostate,  ACEI/ARB: Yes Statin: Yes  Lab Results  Component Value Date   HGBA1C 6.9 (H) 07/05/2019   HGBA1C 6.8 (H) 06/14/2019   HGBA1C 6.3 (H) 04/11/2019   Lab Results  Component Value Date   MICROALBUR 0.6 04/11/2019   LDLCALC 42 06/15/2019   CREATININE 0.79 10/09/2019    BPH with LUTS Medication regimen-finasteride, for which he requests a refill Continues to be followed by urology, although I cannot find any documentation from encounters on epic. Planned prostate procedure - June 28th, Dr. Gloriann Loan   Patient Active Problem List   Diagnosis Date Noted  . Hyponatremia 07/03/2019  . Metabolic acidosis  62/83/6629  . BPH (benign prostatic hyperplasia) 07/03/2019  . PNA (pneumonia) 07/03/2019  . Diabetes mellitus type 2, controlled, with complications ([place]) 47/65/4650  . HLD (hyperlipidemia) 06/14/2019  . 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 Respiratory failure ([place]) 06/11/2019  . Elevated LFTs 06/11/2019  . Pneumonia due to COVID-19 virus 06/10/2019  . Contracture of palmar fascia 10/11/2018  . Microalbuminuria due to type 2 diabetes mellitus ([place]) 04/12/2018  . Type 2 diabetes mellitus ([place]) 04/10/2018  . Osteopenia determined by x-ray 01/07/2017  . Neuroendocrine carcinoma of lung ([place]) 12/28/2016  . Low back pain 12/17/2016  . Liver mass, left lobe 08/28/2016  . 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 History of lung cancer 06/29/2016  . Aortic atherosclerosis ([place]) 04/06/2016  . Coronary artery disease involving native heart without angina pectoris 04/06/2016  . History of colonic polyps 10/28/2015  . Medication monitoring encounter 08/15/2015  . Paroxysmal atrial flutter ([place])   . Elevated serum alkaline phosphatase level 03/05/2015  . Allergic rhinitis   . BPH without urinary obstruction   . Hyperlipidemia 02/13/2014  . Sinus bradycardia 07/18/2013  . S/P lobectomy of  lung 06/04/2013  . Atrial flutter ([place]) 04/09/2013  . Smoking history 04/09/2013  . Essential hypertension 04/09/2013  . Rectal bleeding 12/15/2012      Current Outpatient Medications:  .  amLODipine (NORVASC) 2.5 MG tablet, Take 1 tablet by mouth once daily, Disp: 90 tablet, Rfl: 3 .  aspirin EC 81 MG tablet, Take 81 mg by mouth daily., Disp: , Rfl:  .  atorvastatin (LIPITOR) 40 MG tablet, TAKE 1 TABLET BY MOUTH ONCE DAILY FOR CHOLESTEROL, Disp: 90 tablet, Rfl: 1 .  b complex vitamins capsule, Take 1 capsule by mouth daily., Disp: , Rfl:  .  cholecalciferol (VITAMIN D3) 25 MCG (1000 UNIT) tablet, Take 1,000 Units by mouth daily., Disp: , Rfl:  .  Cinnamon 500 MG capsule, Take 500 mg  by mouth at bedtime. , Disp: , Rfl:  .  diphenhydrAMINE (BENADRYL) 25 mg capsule, Take 25 mg by mouth every morning. , Disp: , Rfl:  .  DM-APAP-CPM (CORICIDIN HBP PO), Take 2 tablets by mouth daily as needed (allergies/cold symptoms)., Disp: , Rfl:  .  finasteride (PROSCAR) 5 MG tablet, Take 1 tablet (5 mg total) by mouth daily., Disp: 90 tablet, Rfl: 1 .  Glucose Blood (BLOOD GLUCOSE TEST STRIPS) STRP, Use as directed to monitor FSBS once daily. Dx: E11.9., Disp: 50 strip, Rfl: 1 .  losartan (COZAAR) 100 MG tablet, Take 1 tablet (100 mg total) by mouth daily., Disp: 90 tablet, Rfl: 1 .  Multiple Vitamins-Minerals (MULTIVITAMIN WITH MINERALS) tablet, Take 1 tablet by mouth daily. , Disp: , Rfl:  .  polycarbophil (FIBERCON) 625 MG tablet, Take 625 mg by mouth 2 (two) times daily. , Disp: , Rfl:  .  sodium chloride (OCEAN) 0.65 % SOLN nasal spray, Place 1 spray into both nostrils as needed for congestion., Disp: , Rfl:  .  tamsulosin (FLOMAX) 0.4 MG CAPS capsule, Take 2 capsules (0.8 mg total) by mouth at bedtime., Disp: 180 capsule, Rfl: 3 .  vitamin C (ASCORBIC ACID) 500 MG tablet, Take 500 mg by mouth daily. , Disp: , Rfl:  .  zinc sulfate 220 (50 Zn) MG capsule, Take 1 capsule (220 mg total) by mouth daily., Disp: 30 capsule, Rfl: 0 No current facility-administered medications for this visit.  Facility-Administered Medications Ordered in Other Visits:  .  lanreotide acetate (SOMATULINE DEPOT) injection 120 mg, 120 mg, Subcutaneous, Once, Grayland Ormond, Kathlene November, MD   Allergies  Allergen Reactions  . Tape Itching    Surgical tapes      Past Surgical History:  Procedure Laterality Date  . BRONCHOSCOPY     04/06/2013  . CARDIOVASCULAR STRESS TEST  05/2013   a. No evidence of ischemia or infarct, EF 70%, no WMAs  . CATARACT EXTRACTION W/PHACO Left 07/14/2015   Procedure: CATARACT EXTRACTION PHACO AND INTRAOCULAR LENS PLACEMENT (IOC);  Surgeon: [name], MD;  Location: [place];  Service: Ophthalmology;  Laterality: Left;  . CATARACT EXTRACTION W/PHACO Right 10/01/2015   Procedure: CATARACT EXTRACTION PHACO AND INTRAOCULAR LENS PLACEMENT (IOC);  Surgeon: [name], MD;  Location: [place];  Service: Ophthalmology;  Laterality: Right;  . COLONOSCOPY W/ POLYPECTOMY     bleed after-had to go to surgery to stop bleeding via colonoscopy  . COLONOSCOPY WITH PROPOFOL N/A 11/19/2015   Procedure: COLONOSCOPY WITH PROPOFOL;  Surgeon: [name], MD;  Location: [facility] END[facility]COPY;  Service: Endoscopy;  Laterality: N/A;  . DUPUYTREN CONTRACTURE RELEASE  12/15/2011   Procedure: DUPUYTREN CONTRACTURE RELEASE;  Surgeon: Dominica Severin  Beola Cord, MD;  Location: [place];  Service: Orthopedics;  Laterality: Left;  Fasciotomy left ring finger dupuytrens  . ELECTROMAGNETIC NAVIGATION BROCHOSCOPY Left 09/19/2019   Procedure: ELECTROMAGNETIC NAVIGATION BRONCHOSCOPY;  Surgeon: Tyler Pita, MD;  Location: [facility];  Service: Cardiopulmonary;  Laterality: Left;  . FASCIECTOMY Right 11/07/2018   Procedure: SEGMENTAL FASCIECTOMY RIGHT RING FINGER;  Surgeon: Daryll Brod, MD;  Location: [place];  Service: Orthopedics;  Laterality: Right;  AXILLARY BLOCK  . IR ANGIOGRAM SELECTIVE EACH ADDITIONAL VESSEL  07/26/2017  . IR ANGIOGRAM SELECTIVE EACH ADDITIONAL VESSEL  07/26/2017  . IR ANGIOGRAM SELECTIVE EACH ADDITIONAL VESSEL  07/26/2017  . IR ANGIOGRAM SELECTIVE EACH ADDITIONAL VESSEL  07/26/2017  . IR ANGIOGRAM SELECTIVE EACH ADDITIONAL VESSEL  07/26/2017  . IR ANGIOGRAM SELECTIVE EACH ADDITIONAL VESSEL  08/11/2017  . IR ANGIOGRAM SELECTIVE EACH ADDITIONAL VESSEL  04/28/2018  . IR ANGIOGRAM VISCERAL SELECTIVE  07/26/2017  . IR ANGIOGRAM VISCERAL SELECTIVE  08/11/2017  . IR ANGIOGRAM VISCERAL SELECTIVE  04/28/2018  . IR EMBO ARTERIAL NOT HEMORR HEMANG INC GUIDE ROADMAPPING  07/26/2017  . IR EMBO TUMOR ORGAN ISCHEMIA INFARCT INC GUIDE ROADMAPPING   08/11/2017  . IR EMBO TUMOR ORGAN ISCHEMIA INFARCT INC GUIDE ROADMAPPING  04/28/2018  . IR RADIOLOGIST EVAL & MGMT  07/07/2017  . IR RADIOLOGIST EVAL & MGMT  09/07/2017  . IR RADIOLOGIST EVAL & MGMT  12/21/2017  . IR RADIOLOGIST EVAL & MGMT  03/22/2018  . IR RADIOLOGIST EVAL & MGMT  06/08/2018  . IR RADIOLOGIST EVAL & MGMT  11/01/2018  . IR RADIOLOGIST EVAL & MGMT  03/21/2019  . IR US GUIDE VASC ACCESS RIGHT  07/26/2017  . IR US GUIDE VASC ACCESS RIGHT  08/11/2017  . IR US GUIDE VASC ACCESS RIGHT  04/28/2018  . LUNG LOBECTOMY  06/10/13   upper left lung  . MULTIPLE TOOTH EXTRACTIONS    . REMOVAL RETAINED LENS Right 11/17/2015   Procedure: REMOVAL RETAINED LENS FROAGMENTS RIGHT EYE;  Surgeon: [name], MD;  Location: [place];  Service: Ophthalmology;  Laterality: Right;  . TONSILLECTOMY    . UPPER GASTROINTESTINAL ENDOSCOPY  11-03-15   Dr [name]  . VASECTOMY    . VIDEO ASSISTED THORACOSCOPY (VATS)/WEDGE RESECTION Left 06/04/2013   Procedure: VIDEO ASSISTED THORACOSCOPY (VATS)/WEDGE RESECTION;  Surgeon: [name], MD;  Location: [place];  Service: Thoracic;  Laterality: Left;  [name] VIDEO BRONCHOSCOPY N/A 06/04/2013   Procedure: VIDEO BRONCHOSCOPY;  Surgeon: [name], MD;  Location: [facility] OR;  Service: Thoracic;  Laterality: N/A;     Family History  Problem Relation Age of Onset  . Stroke Mother   . Alzheimer's disease Mother   . Hypertension Sister   . Hyperlipidemia Sister   . Hypertension Brother   . Hyperlipidemia Brother   . Diabetes Brother      Social History   Tobacco Use  . Smoking status: Former Smoker    Packs/day: 1.00    Years: 40.00    Pack years: 40.00    Types: Cigarettes    Quit date: 12/09/1998    Years since quitting: 20.8  . Smokeless tobacco: Never Used  Substance Use Topics  . Alcohol use: No    With staff assistance, above reviewed with the patient today.  ROS: As per HPI, otherwise no specific complaints on a limited and  focused system review   No results found for this or any previous visit (from the past 72 hour(s)).  PHQ2/9: Depression screen [facility] 2/9 10/25/2019 07/10/2019 06/07/2019 04/11/2019 10/10/2018  Decreased Interest 0 0 0 0 0  Down, Depressed, Hopeless 0 0 0 0 0  PHQ - 2 Score 0 0 0 0 0  Altered sleeping 0 2 0 0 0  Tired, decreased energy 3 3 0 0 0  Change in appetite 0 0 0 0 0  Feeling bad or failure about yourself  0 0 0 0 0  Trouble concentrating 0 0 0 0 0  Moving slowly or fidgety/restless 0 0 0 0 0  Suicidal thoughts 0 0 0 0 0  PHQ-9 Score 3 5 0 0 0  Difficult doing work/chores Not difficult at all Not difficult at all - Not difficult at all Not difficult at all  Some recent data might be hidden   PHQ-2/9 Result reviewed  Fall Risk: Fall Risk  10/25/2019 07/10/2019 06/07/2019 04/11/2019 10/10/2018  Falls in the past year? - 0 0 0 0  Number falls in past yr: 0 0 0 0 -  Injury with Fall? 0 0 0 0 -  Comment - - - - -      Objective:   Vitals:   10/25/19 0932  BP: 126/76  Pulse: 62  Resp: 16  Temp: 98.2 F (36.8 C)  TempSrc: Temporal  SpO2: 97%  Weight: 152 lb (68.9 kg)  Height: 5\' 4"  (1.626 m)    Body mass index is 26.09 kg/m.  Physical Exam   NAD, masked, pleasant HEENT - [place]/AT, sclera anicteric, PERRL, EOMI, positive glasses , conj - non-inj'ed, pharynx clear Neck - supple, no adenopathy, no TM, carotids 2+ and = without bruits bilat Car -regular rate and rhythm with occasional ectopic beat heard,  without m/g/r Pulm- CTA without wheeze or rales Abd - soft, NT diffusely, ND, Back - no CVA tenderness Ext - no LE edema,  Neuro/psychiatric - affect was not flat, appropriate with conversation  Alert  Grossly non-focal - good strength on testing extremities, sensation intact to LT in distal extremities  Speech normal   Results for orders placed or performed during the hospital encounter of 10/10/19  Glucose, capillary  Result Value Ref Range   Glucose-Capillary 96 70 -  99 mg/dL  Surgical pathology  Result Value Ref Range   SURGICAL PATHOLOGY      SURGICAL PATHOLOGY CASE: [id number] PATIENT: Kemari Wax Surgical Pathology Report     Specimen Submitted: A. Lung, left mainstem  Clinical History: Recurrent carcinoid tumor left lung.      DIAGNOSIS: A. LUNG, LEFT MAINSTEM; BIOPSY: - ATYPICAL CARCINOID TUMOR, CLINICALLY RECURRENT (WHO CLASSIFICATION).   GROSS DESCRIPTION: A. Labeled: Lung, left mainstem biopsy Received: The specimen is retrieved via bronchoscopy and placed into formalin. Tissue fragment(s): Multiple Size: Aggregate, 0.4 x 0.3 x 0.1 cm Description: Received are irregular fragments of tan-red soft tissue. No stained slides are performed at the time of procedure. The specimen is filtered into a mesh bag and submitted entirely for routine histology in cassette 1.    Final Diagnosis performed by Raynelle Bring, MD.   Electronically signed 10/11/2019 9:08:28AM The electronic signature indicates that the named Attending Pathologist has evaluated the specimen Technical component perform ed at [facility], [address], [place], [place] [address] Lab: [phone] Dir: Rush Farmer, MD, MMM  Professional component performed at [facility], [facility], [place], [place], [place] [address] Lab: [phone] Dir: Dellia Nims. Reuel Derby, MD        Assessment & Plan:   1. Mixed hyperlipidemia  Remains on a statin, with the last lipid panel very good. Tolerating statin presently, and can continue. - atorvastatin (LIPITOR) 40 MG tablet; TAKE 1 TABLET BY MOUTH ONCE DAILY FOR CHOLESTEROL  Dispense: 90 tablet; Refill: 1  2. Benign prostatic hyperplasia with urinary hesitancy Seeing urology, although I cannot find documentation from their visits on epic He notes he has a planned procedure on the 28th of this month for his prostate. Discussed how the pulmonary procedures and follow-ups should be top priority, and he  notes he is understanding of that. He requested a refill of the finasteride, and states he has been on that for some time now, although I did note the Flomax in his medication list as well. He was convinced he is on this and should continue, and it was refilled. He does have a follow-up later this month with urology, with his prostate medications to be further reviewed at that time as well. - finasteride (PROSCAR) 5 MG tablet; Take 1 tablet (5 mg total) by mouth daily.  Dispense: 90 tablet; Refill: 1  3. Essential hypertension/Orthostasis His blood pressures remain controlled with his medication regimen, although concern may be too well controlled.  Having significant orthostatic symptoms, and he did try to lower the losartan dose to 50 mg a couple days, with his blood pressure increasing when doing so. Felt best to hold the amlodipine presently and have him check blood pressures after to assess. If after holding the amlodipine, the top number is in the high 140s or higher, and the bottom number is closer to 90 and higher, he should return to taking the amlodipine, but try to take that in the evening and the losartan in the morning to see if that is more helpful. Also ask that he follow-up again with cardiology, who has been seeing him in the past. Also would like their opinion with him noting some irregular heartbeat at times. Both him and his wife felt this was a good idea and will call cardiology office to to get that follow-up appointment moved up. - losartan (COZAAR) 100 MG tablet; Take 1 tablet (100 mg total) by mouth daily.  Dispense: 90 tablet; Refill: 1  4. Type 2 diabetes mellitus without complication, without long-term current use of insulin (HCC) His A1c was better today, with his sugars at home remaining well controlled. Continue without medications presently  - POCT HgB A1C  5. Neuroendocrine carcinoma of lung ([place]) Continue follow-up with oncology and pulmonary as planned with  another procedure planned in the very near future.  (At the end of this month) I did note if he gets more shortness of breath concerns that seem to be increasing, he should contact pulmonary to discuss potential recommendations.  We will tentatively schedule follow-up in about 3 months time, can follow-up sooner as needed.   Towanda Malkin, MD 10/25/19 10:10 AM

## 2019-10-25 ENCOUNTER — Encounter: Payer: Self-pay | Admitting: Internal Medicine

## 2019-10-25 ENCOUNTER — Other Ambulatory Visit: Payer: Self-pay

## 2019-10-25 ENCOUNTER — Ambulatory Visit (INDEPENDENT_AMBULATORY_CARE_PROVIDER_SITE_OTHER): Payer: Medicare Other | Admitting: Internal Medicine

## 2019-10-25 VITALS — BP 126/76 | HR 62 | Temp 98.2°F | Resp 16 | Ht 64.0 in | Wt 152.0 lb

## 2019-10-25 DIAGNOSIS — I10 Essential (primary) hypertension: Secondary | ICD-10-CM

## 2019-10-25 DIAGNOSIS — I951 Orthostatic hypotension: Secondary | ICD-10-CM

## 2019-10-25 DIAGNOSIS — E782 Mixed hyperlipidemia: Secondary | ICD-10-CM | POA: Diagnosis not present

## 2019-10-25 DIAGNOSIS — N401 Enlarged prostate with lower urinary tract symptoms: Secondary | ICD-10-CM | POA: Diagnosis not present

## 2019-10-25 DIAGNOSIS — C7A8 Other malignant neuroendocrine tumors: Secondary | ICD-10-CM

## 2019-10-25 DIAGNOSIS — R3911 Hesitancy of micturition: Secondary | ICD-10-CM

## 2019-10-25 DIAGNOSIS — E119 Type 2 diabetes mellitus without complications: Secondary | ICD-10-CM | POA: Diagnosis not present

## 2019-10-25 LAB — POCT GLYCOSYLATED HEMOGLOBIN (HGB A1C): Hemoglobin A1C: 6.6 % — AB (ref 4.0–5.6)

## 2019-10-25 MED ORDER — LOSARTAN POTASSIUM 100 MG PO TABS: 100.0000 mg | ORAL_TABLET | Freq: Every day | ORAL | 1 refills | Status: DC

## 2019-10-25 MED ORDER — FINASTERIDE 5 MG PO TABS: 5.0000 mg | ORAL_TABLET | Freq: Every day | ORAL | 1 refills | Status: DC

## 2019-10-25 MED ORDER — ATORVASTATIN CALCIUM 40 MG PO TABS: ORAL_TABLET | ORAL | 1 refills | Status: DC

## 2019-10-25 NOTE — Patient Instructions (Signed)
Please contact cardiology to make your f/u visit sooner

## 2019-11-02 ENCOUNTER — Other Ambulatory Visit: Payer: Self-pay

## 2019-11-02 ENCOUNTER — Ambulatory Visit
Admission: RE | Admit: 2019-11-02 | Discharge: 2019-11-02 | Disposition: A | Discharge status: Home / Self Care | Payer: Medicare Other | Source: Ambulatory Visit | Attending: Oncology | Admitting: Oncology

## 2019-11-02 DIAGNOSIS — C7B8 Other secondary neuroendocrine tumors: Secondary | ICD-10-CM | POA: Diagnosis present

## 2019-11-02 DIAGNOSIS — C7A8 Other malignant neuroendocrine tumors: Secondary | ICD-10-CM | POA: Insufficient documentation

## 2019-11-02 MED ORDER — IOHEXOL 300 MG/ML  SOLN
100.0000 mL | Freq: Once | INTRAMUSCULAR | Status: AC | PRN
  Administered 2019-11-02: 100 mL via INTRAVENOUS

## 2019-11-06 ENCOUNTER — Inpatient Hospital Stay: Payer: Medicare Other

## 2019-11-06 ENCOUNTER — Ambulatory Visit (INDEPENDENT_AMBULATORY_CARE_PROVIDER_SITE_OTHER): Payer: Medicare Other | Admitting: Family

## 2019-11-06 ENCOUNTER — Telehealth: Payer: Self-pay

## 2019-11-06 ENCOUNTER — Ambulatory Visit (INDEPENDENT_AMBULATORY_CARE_PROVIDER_SITE_OTHER): Payer: Medicare Other | Admitting: Pulmonary Disease

## 2019-11-06 ENCOUNTER — Encounter: Payer: Self-pay | Admitting: Pulmonary Disease

## 2019-11-06 ENCOUNTER — Encounter: Payer: Self-pay | Admitting: Family

## 2019-11-06 ENCOUNTER — Ambulatory Visit (INDEPENDENT_AMBULATORY_CARE_PROVIDER_SITE_OTHER): Payer: Medicare Other

## 2019-11-06 ENCOUNTER — Other Ambulatory Visit: Payer: Self-pay

## 2019-11-06 VITALS — BP 124/64 | HR 72 | Temp 97.7°F | Ht 64.0 in | Wt 155.8 lb

## 2019-11-06 VITALS — BP 121/68 | HR 70 | Ht 64.0 in | Wt 157.0 lb

## 2019-11-06 DIAGNOSIS — I10 Essential (primary) hypertension: Secondary | ICD-10-CM

## 2019-11-06 DIAGNOSIS — J8410 Pulmonary fibrosis, unspecified: Secondary | ICD-10-CM | POA: Diagnosis not present

## 2019-11-06 DIAGNOSIS — I4892 Unspecified atrial flutter: Secondary | ICD-10-CM

## 2019-11-06 DIAGNOSIS — I2510 Atherosclerotic heart disease of native coronary artery without angina pectoris: Secondary | ICD-10-CM

## 2019-11-06 DIAGNOSIS — C7B8 Other secondary neuroendocrine tumors: Secondary | ICD-10-CM

## 2019-11-06 DIAGNOSIS — Z8616 Personal history of COVID-19: Secondary | ICD-10-CM

## 2019-11-06 DIAGNOSIS — I700 Atherosclerosis of aorta: Secondary | ICD-10-CM

## 2019-11-06 DIAGNOSIS — C7A8 Other malignant neuroendocrine tumors: Secondary | ICD-10-CM

## 2019-11-06 DIAGNOSIS — R0600 Dyspnea, unspecified: Secondary | ICD-10-CM | POA: Diagnosis not present

## 2019-11-06 DIAGNOSIS — I951 Orthostatic hypotension: Secondary | ICD-10-CM

## 2019-11-06 DIAGNOSIS — J449 Chronic obstructive pulmonary disease, unspecified: Secondary | ICD-10-CM | POA: Diagnosis not present

## 2019-11-06 LAB — CBC WITH DIFFERENTIAL/PLATELET
Abs Immature Granulocytes: 0.08 10*3/uL — ABNORMAL HIGH (ref 0.00–0.07)
Basophils Absolute: 0.1 10*3/uL (ref 0.0–0.1)
Basophils Relative: 0 %
Eosinophils Absolute: 0 10*3/uL (ref 0.0–0.5)
Eosinophils Relative: 0 %
HCT: 45.4 % (ref 39.0–52.0)
Hemoglobin: 15 g/dL (ref 13.0–17.0)
Immature Granulocytes: 0 %
Lymphocytes Relative: 3 %
Lymphs Abs: 0.7 10*3/uL (ref 0.7–4.0)
MCH: 29.2 pg (ref 26.0–34.0)
MCHC: 33 g/dL (ref 30.0–36.0)
MCV: 88.5 fL (ref 80.0–100.0)
Monocytes Absolute: 2.4 10*3/uL — ABNORMAL HIGH (ref 0.1–1.0)
Monocytes Relative: 13 %
Neutro Abs: 15.7 10*3/uL — ABNORMAL HIGH (ref 1.7–7.7)
Neutrophils Relative %: 84 %
Platelets: 163 10*3/uL (ref 150–400)
RBC: 5.13 MIL/uL (ref 4.22–5.81)
RDW: 13.9 % (ref 11.5–15.5)
WBC: 19 10*3/uL — ABNORMAL HIGH (ref 4.0–10.5)
nRBC: 0 % (ref 0.0–0.2)

## 2019-11-06 LAB — COMPREHENSIVE METABOLIC PANEL
ALT: 30 U/L (ref 0–44)
AST: 29 U/L (ref 15–41)
Albumin: 3.9 g/dL (ref 3.5–5.0)
Alkaline Phosphatase: 135 U/L — ABNORMAL HIGH (ref 38–126)
Anion gap: 7 (ref 5–15)
BUN: 21 mg/dL (ref 8–23)
CO2: 26 mmol/L (ref 22–32)
Calcium: 8.8 mg/dL — ABNORMAL LOW (ref 8.9–10.3)
Chloride: 102 mmol/L (ref 98–111)
Creatinine, Ser: 1.22 mg/dL (ref 0.61–1.24)
GFR calc Af Amer: >60 mL/min (ref >60)
GFR calc non Af Amer: 57 mL/min — ABNORMAL LOW (ref >60)
Glucose, Bld: 176 mg/dL — ABNORMAL HIGH (ref 70–99)
Potassium: 4.4 mmol/L (ref 3.5–5.1)
Sodium: 135 mmol/L (ref 135–145)
Total Bilirubin: 0.8 mg/dL (ref 0.3–1.2)
Total Protein: 6.8 g/dL (ref 6.5–8.1)

## 2019-11-06 MED ORDER — LOSARTAN POTASSIUM 50 MG PO TABS
75.0000 mg | ORAL_TABLET | Freq: Every day | ORAL | 2 refills | Status: DC
Start: 2019-11-06 — End: 2019-12-19

## 2019-11-06 MED ORDER — LANREOTIDE ACETATE 120 MG/0.5ML ~~LOC~~ SOLN
120.0000 mg | Freq: Once | SUBCUTANEOUS | Status: AC
  Administered 2019-11-06: 120 mg via SUBCUTANEOUS
  Filled 2019-11-06: qty 120

## 2019-11-06 MED ORDER — BREO ELLIPTA 100-25 MCG/INH IN AEPB
1.0000 | INHALATION_SPRAY | Freq: Every day | RESPIRATORY_TRACT | 0 refills | Status: DC
Start: 2019-11-06 — End: 2019-12-17

## 2019-11-06 NOTE — Telephone Encounter (Signed)
Called [facility] and spoke with Marlena Clipper. Procedure codes [phone] and [phone] are V/B Codes which PA is not required. Call Ref # [id number]. Rhonda J Cobb

## 2019-11-06 NOTE — Progress Notes (Addendum)
Subjective:    Patient ID: Jason Malone, male    DOB: 1940/11/22, 79 y.o.   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 Delight Hoh. The patient had undergone left upper lobectomy by Dr. Eleanora Neighbor in January 2015. This revealedaneuroendocrine tumor (carcinoid) which was 4.7 cm in diameter. There was endobronchial involvement at the time. The mass was in the hilum and initially it was thought that patient would need pneumonectomy. During surgery it was noted that the tumor encircled the pulmonary artery. The dissection was noted to be "tedious". The initial margins of the bronchial stump were positive for tumor. However, once this was recognized additional resection was done and margins were clear on the final pathology.After resection the patient was presented at the multidisciplinary thoracic oncology conference and no further treatment other than observation was recommended. He was classified as a stage Ib.Patient was noted to have a recurrence of neuroendocrine disease on August 13, 2018when liver involvement was noted. He was started on lanreotide every 4 weeks.In June 10, 2019 he contracted COVID-19. He was admitted to the [facility]. He developed acute hypoxic respiratory failure due to COVID-19. A CT scan performed on 11 June 2019 showed typical parenchymal involvement of COVID-19 on the right lung. On the left lung he had a lobulated mass in the left mainstem bronchus. This appeared to be arising from the suture line from the prior resection. This is likely recurrent carcinoid tumor. There was also worrisome finding of thinning along the esophageal margin of the bronchus but there appearedto be no clear esophageal  fistulization. On follow-up CT performed 7 April this potential thinning along the esophageal margin appears to be distinctly separate from the lesion in question. I suspect that the initial findings were related to inflammatory changes due to COVID-19.  The patient has undergone 2 bronchoscopies  with cryoablation and tumor destruction.  The first 1 was on 19 Sep 2019 where the majority of the tumor mass in the left mainstem bronchus was removed.  Second treatment was on 10 October 2019 and the remnant of neuroendocrine tumor remaining was ablated and resected.  Since his initial treatment he has not required oxygen.  Patient underwent went CT scan of the chest on 02 November 2019 that shows resolution of the previous bilobed lesions in the left mainstem bronchus with only minimal residual wall irregularity at the area of the prior suture line.  This will require 1 more cryotherapy session to ensure the best therapeutic response.  Patient and his wife are aware of this.  Patient has been doing well since his initial cryoablation and tumor destruction.  His breathing has improved dramatically he still gets short of breath on occasion however it is noted that he does have significant emphysema and this of her fibrotic changes after COVID-19.  He has not had any fevers, chills or sweats.  Overall feels well.   Review of Systems A 10 point review of systems was performed and it is as noted above otherwise negative.  Allergies  Allergen Reactions  . Tape Itching    Surgical tapes    Current Meds  Medication Sig  . aspirin EC 81 MG tablet Take 81 mg by mouth daily.  [name] atorvastatin (LIPITOR) 40 MG tablet TAKE 1 TABLET BY MOUTH  ONCE DAILY FOR CHOLESTEROL (Patient taking differently: Take 40 mg by mouth daily. TAKE 1 TABLET BY MOUTH ONCE DAILY FOR CHOLESTEROL)  . b complex vitamins capsule Take 1 capsule by mouth daily.  . cholecalciferol (VITAMIN D3) 25 MCG (1000 UNIT) tablet Take 1,000 Units by mouth daily.   . Cinnamon 500 MG capsule Take 500 mg by mouth at bedtime.   . diphenhydrAMINE (BENADRYL) 25 mg capsule Take 25 mg by mouth every morning.   [name] DM-APAP-CPM (CORICIDIN HBP PO) Take 2 tablets by mouth daily as needed (allergies/cold symptoms).  . finasteride (PROSCAR) 5 MG tablet Take 1 tablet (5 mg total) by mouth daily.  . Glucose Blood (BLOOD GLUCOSE TEST STRIPS) STRP Use as directed to monitor FSBS once daily. Dx: E11.9.  . Multiple Vitamins-Minerals (MULTIVITAMIN WITH MINERALS) tablet Take 1 tablet by mouth daily.   . polycarbophil (FIBERCON) 625 MG tablet Take 625 mg by mouth 2 (two) times daily.   . tamsulosin (FLOMAX) 0.4 MG CAPS capsule Take 2 capsules (0.8 mg total) by mouth at bedtime.  . vitamin C (ASCORBIC ACID) 500 MG tablet Take 500 mg by mouth daily.   [name] zinc sulfate 220 (50 Zn) MG capsule Take 1 capsule (220 mg total) by mouth daily.  . [DISCONTINUED] losartan (COZAAR) 100 MG tablet Take 1 tablet (100 mg total) by mouth daily.  . [DISCONTINUED] sodium chloride (OCEAN) 0.65 % SOLN nasal spray Place 1 spray into both nostrils as needed for congestion.   Immunization History  Administered Date(s) Administered  . Fluad Quad(high Dose 65+) 01/24/2019  . Influenza, High Dose Seasonal PF 02/12/2016, 02/14/2017, 01/17/2018  . Influenza,inj,Quad PF,6+ Mos 02/14/2015  . PFIZER SARS-COV-2 Vaccination 05/24/2019, 10/02/2019  . Pneumococcal Conjugate-13 02/01/2014  . Pneumococcal Polysaccharide-23 05/10/2008       Objective:   Physical Exam BP 124/64 (BP Location: Right Arm, Cuff Size: Normal)   Pulse 72   Temp 97.7 F (36.5 C) (Temporal)   Ht 5\' 4"  (1.626 m)   Wt 155 lb 12.8 oz (70.7 kg)   SpO2 98%   BMI 26.74 kg/m   GENERAL: Awake and alert, well developed, well nourished gentleman, in no respiratory distress, fully ambulatory HEAD: Normocephalic, atraumatic.  EYES: Pupils equal, round, reactive to light. No scleral icterus.  MOUTH: Nose/mouth/throat not examined due to  masking requirements for COVID 19. NECK: Supple. No thyromegaly. No nodules. No JVD. Trachea is midline PULMONARY:  Symmetrical air entry, somewhat distant sounds, no wheezes or rhonchi noted. CARDIOVASCULAR: S1 and S2.   Regular rate and rhythm. No rubs murmurs or gallops appreciated. GASTROINTESTINAL: Nondistended, soft. ON:GEXBM in place draining clear urine (had "prostate steaming" yesterday). MUSCULOSKELETAL: No joint deformity, no clubbing, no edema.  NEUROLOGIC: Awake and alert, no gross focal deficit. Speech is fluent. SKIN: Intact,warm,dry. Skin is somewhat pale. No rashes noted on limited exam. PSYCH: Mood and behavior normal.  Representative slice of CT from 15 August 2019, bilobed lesion obstructing left main bronchus:     Representative slice from CT scan of the chest performed 02 November 2019 showing resolution of the bilobed mass left mainstem bronchus with only residual irregularity left:    Assessment & Plan:     ICD-10-CM   1. Neuroendocrine carcinoma of lung ([place])  C7A.8    Status post cryoablation and tumor destruction x2 Will need final cryoablation to potential remaining tissue Scheduled for 26 November 2019 1300 hrs.  2. COPD mixed type ([place])  J44.9    COPD on the basis of emphysema  and mucopurulent bronchitis Trial of Breo Ellipta Avoiding LAMA's due to prior issue with urinary retention  3. 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 Personal history of covid-19  Z86.16    This issue adds complexity to his management   Meds ordered this encounter  Medications  . fluticasone furoate-vilanterol (BREO ELLIPTA) 100-25 MCG/INH AEPB    Sig: Inhale 1 puff into the lungs daily.    Dispense:  28 each    Refill:  0    Order Specific Question:   Lot Number?    Answer:   [id number]    Order Specific Question:   Manufacturer?    Answer:   Gl[employer]oSmithKline [[id number]]    Order Specific  Question:   Quantity    Answer:   1   Discussion:  Patient will require one more and hopefully last treatment with cryotherapy to the area previously involved with carcinoid tumor on the left mainstem bronchus.  There has been marked radiographic improvement as well as functional improvement.  However there is an area of irregularity still remaining by CT scan of the chest and will proceed with treating this area.  Overall there has been marked improvement however a final cryoablation will ensure delaying potential recurrence.  Benefits, limitations and potential complications of the procedure were discussed with the patient/wife including, but not limited to bleeding, hemoptysis, respiratory failure requiring intubation and/or prolongued mechanical ventilation, infection, pneumothorax (collapse of lung) requiring chest tube placement, stroke from air embolism or even death.  Patient agrees to proceed.  He understands the procedure will be done under the general anesthesia.  Procedure has been schedule tentatively for 19 July at 1 PM.  C. Derrill Kay, MD [facility]   *This note was dictated using voice recognition software/Dragon.  Despite best efforts to proofread, errors can occur which can change the meaning.  Any change was purely unintentional.

## 2019-11-06 NOTE — H&P (View-Only) (Signed)
Subjective:    Patient ID: Jason Malone, male    DOB: November 13, 1940, 79 y.o.   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 Delight Hoh. The patient had undergone left upper lobectomy by Dr. Eleanora Neighbor in January 2015. This revealedaneuroendocrine tumor (carcinoid) which was 4.7 cm in diameter. There was endobronchial involvement at the time. The mass was in the hilum and initially it was thought that patient would need pneumonectomy. During surgery it was noted that the tumor encircled the pulmonary artery. The dissection was noted to be "tedious". The initial margins of the bronchial stump were positive for tumor. However, once this was recognized additional resection was done and margins were clear on the final pathology.After resection the patient was presented at the multidisciplinary thoracic oncology conference and no further treatment other than observation was recommended. He was classified as a stage Ib.Patient was noted to have a recurrence of neuroendocrine disease on August 13, 2018when liver involvement was noted. He was started on lanreotide every 4 weeks.In June 10, 2019 he contracted COVID-19. He was admitted to the [facility]. He developed acute hypoxic respiratory failure due to COVID-19. A CT scan performed on 11 June 2019 showed typical parenchymal involvement of COVID-19 on the right lung. On the left lung he had a lobulated mass in the left mainstem bronchus. This appeared to be arising from the suture line from the prior resection. This is likely recurrent carcinoid tumor. There was also worrisome finding of thinning along the esophageal margin of the bronchus but there appearedto be no clear esophageal  fistulization. On follow-up CT performed 7 April this potential thinning along the esophageal margin appears to be distinctly separate from the lesion in question. I suspect that the initial findings were related to inflammatory changes due to COVID-19.  The patient has undergone 2 bronchoscopies  with cryoablation and tumor destruction.  The first 1 was on 19 Sep 2019 where the majority of the tumor mass in the left mainstem bronchus was removed.  Second treatment was on 10 October 2019 and the remnant of neuroendocrine tumor remaining was ablated and resected.  Since his initial treatment he has not required oxygen.  Patient underwent went CT scan of the chest on 02 November 2019 that shows resolution of the previous bilobed lesions in the left mainstem bronchus with only minimal residual wall irregularity at the area of the prior suture line.  This will require 1 more cryotherapy session to ensure the best therapeutic response.  Patient and his wife are aware of this.  Patient has been doing well since his initial cryoablation and tumor destruction.  His breathing has improved dramatically he still gets short of breath on occasion however it is noted that he does have significant emphysema and this of her fibrotic changes after COVID-19.  He has not had any fevers, chills or sweats.  Overall feels well.   Review of Systems A 10 point review of systems was performed and it is as noted above otherwise negative.  Allergies  Allergen Reactions  . Tape Itching    Surgical tapes    Current Meds  Medication Sig  . aspirin EC 81 MG tablet Take 81 mg by mouth daily.  [name] atorvastatin (LIPITOR) 40 MG tablet TAKE 1 TABLET BY MOUTH  ONCE DAILY FOR CHOLESTEROL (Patient taking differently: Take 40 mg by mouth daily. TAKE 1 TABLET BY MOUTH ONCE DAILY FOR CHOLESTEROL)  . b complex vitamins capsule Take 1 capsule by mouth daily.  . cholecalciferol (VITAMIN D3) 25 MCG (1000 UNIT) tablet Take 1,000 Units by mouth daily.   . Cinnamon 500 MG capsule Take 500 mg by mouth at bedtime.   . diphenhydrAMINE (BENADRYL) 25 mg capsule Take 25 mg by mouth every morning.   [name] DM-APAP-CPM (CORICIDIN HBP PO) Take 2 tablets by mouth daily as needed (allergies/cold symptoms).  . finasteride (PROSCAR) 5 MG tablet Take 1 tablet (5 mg total) by mouth daily.  . Glucose Blood (BLOOD GLUCOSE TEST STRIPS) STRP Use as directed to monitor FSBS once daily. Dx: E11.9.  . Multiple Vitamins-Minerals (MULTIVITAMIN WITH MINERALS) tablet Take 1 tablet by mouth daily.   . polycarbophil (FIBERCON) 625 MG tablet Take 625 mg by mouth 2 (two) times daily.   . tamsulosin (FLOMAX) 0.4 MG CAPS capsule Take 2 capsules (0.8 mg total) by mouth at bedtime.  . vitamin C (ASCORBIC ACID) 500 MG tablet Take 500 mg by mouth daily.   [name] zinc sulfate 220 (50 Zn) MG capsule Take 1 capsule (220 mg total) by mouth daily.  . [DISCONTINUED] losartan (COZAAR) 100 MG tablet Take 1 tablet (100 mg total) by mouth daily.  . [DISCONTINUED] sodium chloride (OCEAN) 0.65 % SOLN nasal spray Place 1 spray into both nostrils as needed for congestion.   Immunization History  Administered Date(s) Administered  . Fluad Quad(high Dose 65+) 01/24/2019  . Influenza, High Dose Seasonal PF 02/12/2016, 02/14/2017, 01/17/2018  . Influenza,inj,Quad PF,6+ Mos 02/14/2015  . PFIZER SARS-COV-2 Vaccination 05/24/2019, 10/02/2019  . Pneumococcal Conjugate-13 02/01/2014  . Pneumococcal Polysaccharide-23 05/10/2008       Objective:   Physical Exam BP 124/64 (BP Location: Right Arm, Cuff Size: Normal)   Pulse 72   Temp 97.7 F (36.5 C) (Temporal)   Ht 5\' 4"  (1.626 m)   Wt 155 lb 12.8 oz (70.7 kg)   SpO2 98%   BMI 26.74 kg/m   GENERAL: Awake and alert, well developed, well nourished gentleman, in no respiratory distress, fully ambulatory HEAD: Normocephalic, atraumatic.  EYES: Pupils equal, round, reactive to light. No scleral icterus.  MOUTH: Nose/mouth/throat not examined due to  masking requirements for COVID 19. NECK: Supple. No thyromegaly. No nodules. No JVD. Trachea is midline PULMONARY:  Symmetrical air entry, somewhat distant sounds, no wheezes or rhonchi noted. CARDIOVASCULAR: S1 and S2.   Regular rate and rhythm. No rubs murmurs or gallops appreciated. GASTROINTESTINAL: Nondistended, soft. OE:VOJJK in place draining clear urine (had "prostate steaming" yesterday). MUSCULOSKELETAL: No joint deformity, no clubbing, no edema.  NEUROLOGIC: Awake and alert, no gross focal deficit. Speech is fluent. SKIN: Intact,warm,dry. Skin is somewhat pale. No rashes noted on limited exam. PSYCH: Mood and behavior normal.  Representative slice of CT from 15 August 2019, bilobed lesion obstructing left main bronchus:     Representative slice from CT scan of the chest performed 02 November 2019 showing resolution of the bilobed mass left mainstem bronchus with only residual irregularity left:    Assessment & Plan:     ICD-10-CM   1. Neuroendocrine carcinoma of lung ([place])  C7A.8    Status post cryoablation and tumor destruction x2 Will need final cryoablation to potential remaining tissue Scheduled for 26 November 2019 1300 hrs.  2. COPD mixed type ([place])  J44.9    COPD on the basis of emphysema  and mucopurulent bronchitis Trial of Breo Ellipta Avoiding LAMA's due to prior issue with urinary retention  3. 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 Personal history of covid-19  Z86.16    This issue adds complexity to his management   Meds ordered this encounter  Medications  . fluticasone furoate-vilanterol (BREO ELLIPTA) 100-25 MCG/INH AEPB    Sig: Inhale 1 puff into the lungs daily.    Dispense:  28 each    Refill:  0    Order Specific Question:   Lot Number?    Answer:   [id number]    Order Specific Question:   Manufacturer?    Answer:   GlaxoSmithKline [[id number]]    Order Specific  Question:   Quantity    Answer:   1   Discussion:  Patient will require one more and hopefully last treatment with cryotherapy to the area previously involved with carcinoid tumor on the left mainstem bronchus.  There has been marked radiographic improvement as well as functional improvement.  However there is an area of irregularity still remaining by CT scan of the chest and will proceed with treating this area.  Overall there has been marked improvement however a final cryoablation will ensure delaying potential recurrence.  Benefits, limitations and potential complications of the procedure were discussed with the patient/wife including, but not limited to bleeding, hemoptysis, respiratory failure requiring intubation and/or prolongued mechanical ventilation, infection, pneumothorax (collapse of lung) requiring chest tube placement, stroke from air embolism or even death.  Patient agrees to proceed.  He understands the procedure will be done under the general anesthesia.  Procedure has been schedule tentatively for 19 July at 1 PM.  C. Derrill Kay, MD [facility]   *This note was dictated using voice recognition software/Dragon.  Despite best efforts to proofread, errors can occur which can change the meaning.  Any change was purely unintentional.

## 2019-11-06 NOTE — Progress Notes (Signed)
Office Visit    Patient Name: Jason Malone Date of Encounter: 11/06/2019  Primary Care Provider:  Towanda Malkin, MD Primary Cardiologist:  Ida Rogue, MD Electrophysiologist:  None   Chief Complaint    Jason Malone is a 79 y.o. 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   Past Medical History    Past Medical History:  Diagnosis Date  . Allergic rhinitis   . Benign prostatic hypertrophy with lower urinary tract symptoms (LUTS)   . Chest pain    a. 05/2013 MV: EF 70%, no ischemia.  . Diverticulitis    DIVERTICULOSIS  . Enlarged prostate   . Essential hypertension    CONTROLLED ON MEDS  . Full dentures    upper and lower  . H/O hypokalemia   . Hx of atrial fibrillation without current medication    CARDIOLOGIST-DR [facility]  . 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 Hyperlipemia   . Hypomagnesemia   . IFG (impaired fasting glucose)   . Liver cancer ([place])   . Liver cancer ([place]) 12/08/2016  . Liver mass, left lobe 08/28/2016   Probably hemangioma; will get MRI of liver, refer to GI  . Lung cancer ([place])    a. carcinoid, left lung, Stage 1b (T2a, N0, cM0);  b. 05/2013 s/p VATS & LULobectomy.  . Monocytosis   . Osteopenia   . Paroxysmal atrial flutter ([facility])    a. 03/2013->no recurrence;  b. CHA2DS2VASc = 2-->not currently on anticoagulation;  c. 05/2012 Echo: EF 55-60%, normal RV.  [name] Wears glasses    Past Surgical History:  Procedure Laterality Date  . BRONCHOSCOPY     04/06/2013  . CARDIOVASCULAR STRESS TEST  05/2013   a. No evidence of ischemia or infarct, EF 70%, no WMAs  . CATARACT EXTRACTION W/PHACO Left 07/14/2015   Procedure: CATARACT EXTRACTION PHACO AND INTRAOCULAR LENS PLACEMENT (IOC);  Surgeon: [name], MD;  Location: [place];  Service: Ophthalmology;  Laterality: Left;  . CATARACT EXTRACTION W/PHACO Right 10/01/2015   Procedure: CATARACT EXTRACTION PHACO AND INTRAOCULAR LENS PLACEMENT (IOC);  Surgeon: [name], MD;  Location: [place];  Service: Ophthalmology;  Laterality: Right;  . COLONOSCOPY W/ POLYPECTOMY     bleed after-had to go to surgery to stop bleeding via colonoscopy  . COLONOSCOPY WITH PROPOFOL N/A 11/19/2015   Procedure: COLONOSCOPY WITH PROPOFOL;  Surgeon: [name], MD;  Location: [facility];  Service: Endoscopy;  Laterality: N/A;  . DUPUYTREN CONTRACTURE RELEASE  12/15/2011   Procedure: DUPUYTREN CONTRACTURE RELEASE;  Surgeon: [name], MD;  Location: [place];  Service: Orthopedics;  Laterality: Left;  Fasciotomy left ring finger dupuytrens  . ELECTROMAGNETIC NAVIGATION BROCHOSCOPY Left 09/19/2019   Procedure: ELECTROMAGNETIC NAVIGATION BRONCHOSCOPY;  Surgeon: Tyler Pita, MD;  Location: [facility];  Service: Cardiopulmonary;  Laterality: Left;  . FASCIECTOMY Right 11/07/2018   Procedure: SEGMENTAL FASCIECTOMY RIGHT RING FINGER;  Surgeon: Daryll Brod, MD;  Location: [place];  Service: Orthopedics;  Laterality: Right;  AXILLARY BLOCK  . IR ANGIOGRAM SELECTIVE EACH ADDITIONAL VESSEL  07/26/2017  . IR ANGIOGRAM SELECTIVE EACH ADDITIONAL VESSEL  07/26/2017  . IR ANGIOGRAM SELECTIVE EACH ADDITIONAL VESSEL  07/26/2017  . IR ANGIOGRAM SELECTIVE EACH ADDITIONAL  VESSEL  07/26/2017  . IR ANGIOGRAM SELECTIVE EACH ADDITIONAL VESSEL  07/26/2017  . IR ANGIOGRAM SELECTIVE EACH ADDITIONAL VESSEL  08/11/2017  . IR ANGIOGRAM SELECTIVE EACH ADDITIONAL VESSEL  04/28/2018  . IR ANGIOGRAM VISCERAL SELECTIVE  07/26/2017  . IR ANGIOGRAM VISCERAL SELECTIVE  08/11/2017  . IR ANGIOGRAM VISCERAL SELECTIVE  04/28/2018  . IR EMBO ARTERIAL NOT HEMORR HEMANG INC GUIDE ROADMAPPING  07/26/2017  . IR EMBO TUMOR ORGAN  ISCHEMIA INFARCT INC GUIDE ROADMAPPING  08/11/2017  . IR EMBO TUMOR ORGAN ISCHEMIA INFARCT INC GUIDE ROADMAPPING  04/28/2018  . IR RADIOLOGIST EVAL & MGMT  07/07/2017  . IR RADIOLOGIST EVAL & MGMT  09/07/2017  . IR RADIOLOGIST EVAL & MGMT  12/21/2017  . IR RADIOLOGIST EVAL & MGMT  03/22/2018  . IR RADIOLOGIST EVAL & MGMT  06/08/2018  . IR RADIOLOGIST EVAL & MGMT  11/01/2018  . IR RADIOLOGIST EVAL & MGMT  03/21/2019  . IR US GUIDE VASC ACCESS RIGHT  07/26/2017  . IR US GUIDE VASC ACCESS RIGHT  08/11/2017  . IR US GUIDE VASC ACCESS RIGHT  04/28/2018  . LUNG LOBECTOMY  06/10/13   upper left lung  . MULTIPLE TOOTH EXTRACTIONS    . REMOVAL RETAINED LENS Right 11/17/2015   Procedure: REMOVAL RETAINED LENS FROAGMENTS RIGHT EYE;  Surgeon: [name], MD;  Location: [place];  Service: Ophthalmology;  Laterality: Right;  . TONSILLECTOMY    . UPPER GASTROINTESTINAL ENDOSCOPY  11-03-15   Dr [name]  . VASECTOMY    . VIDEO ASSISTED THORACOSCOPY (VATS)/WEDGE RESECTION Left 06/04/2013   Procedure: VIDEO ASSISTED THORACOSCOPY (VATS)/WEDGE RESECTION;  Surgeon: [name], MD;  Location: [place];  Service: Thoracic;  Laterality: Left;  [name] VIDEO BRONCHOSCOPY N/A 06/04/2013   Procedure: VIDEO BRONCHOSCOPY;  Surgeon: [name], MD;  Location: [facility] OR;  Service: Thoracic;  Laterality: N/A;    Allergies  Allergies  Allergen Reactions  . Tape Itching    Surgical tapes     History of Present Illness    Jason Malone is a 79 y.o. 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 Rockey Situ 03/2019.  Atrial flutter was treated with rate controlling medications and he self converted. Initial diagnosis during hospitalization 03/2013.   HCTZ previously stopped due to hypokalemia and hypomagnesia.  Coreg previously  held for orthostasis. Elevated HR 07/2019. Started on metoprolol tartrate 25mg  PRN for elevated heart rates. TElls me he has not had to take this medication.   He had COVID pneumonia 07/2019. He is presently following with Dr. Grayland Ormond of oncology for endobronchial lesion which was determined to be neuroendocrine tumor of the lung with metastasis to liver. Presently on lanreotide. He had ablation 5/1/221 and 10/10/19. He has upcoming cyroablation with Dr. Patsey Berthold 11/26/19.   Patient reports DOE, lightheadedness and weakness in his legs. Reports dyspnea not with regular walking but if he does more exertional activity. This has been going on for the last 2-3 months. Seems to be getting worse. He notes weakness in his legs and dyspnea with activities such as standing to shower. However, he is able to walk 20 minutes per day on the treadmill without difficulty. No symptoms concerning for claudication - no pain in legs with ambulation.   Longstanding non productive cough.   Reports feeling lightheaded and as  if his vision gets blurred. Has felt like he has needed to pass out, but no true syncope. Notices particularly with position changes. Orthostatic vital signs were positive in the office today.   BP at home used to be in the 120s all the time. Has been as low 79/46 when checked recently.   He eats a low sodium diet. He endorses only drinking about 2 ottles of water and one bottle of soda throughout the day.   EKGs/Labs/Other Studies Reviewed:   The following studies were reviewed today:   EKG:  EKG is  ordered today.  The ekg ordered today demonstrates NSR 70 bpm with no acute ST/T wave changes.   Recent Labs: 07/06/2019: B Natriuretic Peptide 158.7; Magnesium 2.0 11/06/2019: ALT 30; BUN 21; Creatinine, Ser 1.22; Hemoglobin 15.0; Platelets 163; Potassium 4.4; Sodium 135  Recent Lipid Panel    Component Value Date/Time   CHOL 83 06/15/2019 0114   CHOL 133 02/14/2017 1046   TRIG 44 06/15/2019  0114   HDL 32 (L) 06/15/2019 0114   HDL 39 (L) 02/14/2017 1046   CHOLHDL 2.6 06/15/2019 0114   VLDL 9 06/15/2019 0114   LDLCALC 42 06/15/2019 0114   LDLCALC 53 04/11/2019 0000    Home Medications   Current Meds  Medication Sig  . aspirin EC 81 MG tablet Take 81 mg by mouth daily.  [name] atorvastatin (LIPITOR) 40 MG tablet TAKE 1 TABLET BY MOUTH ONCE DAILY FOR CHOLESTEROL  . b complex vitamins capsule Take 1 capsule by mouth daily.  . cholecalciferol (VITAMIN D3) 25 MCG (1000 UNIT) tablet Take 1,000 Units by mouth daily.  . Cinnamon 500 MG capsule Take 500 mg by mouth at bedtime.   . diphenhydrAMINE (BENADRYL) 25 mg capsule Take 25 mg by mouth every morning.   [name] DM-APAP-CPM (CORICIDIN HBP PO) Take 2 tablets by mouth daily as needed (allergies/cold symptoms).  . finasteride (PROSCAR) 5 MG tablet Take 1 tablet (5 mg total) by mouth daily.  . fluticasone furoate-vilanterol (BREO ELLIPTA) 100-25 MCG/INH AEPB Inhale 1 puff into the lungs daily.  . Glucose Blood (BLOOD GLUCOSE TEST STRIPS) STRP Use as directed to monitor FSBS once daily. Dx: E11.9.  . Multiple Vitamins-Minerals (MULTIVITAMIN WITH MINERALS) tablet Take 1 tablet by mouth daily.   . polycarbophil (FIBERCON) 625 MG tablet Take 625 mg by mouth 2 (two) times daily.   . sodium chloride (OCEAN) 0.65 % SOLN nasal spray Place 1 spray into both nostrils as needed for congestion.  . tamsulosin (FLOMAX) 0.4 MG CAPS capsule Take 2 capsules (0.8 mg total) by mouth at bedtime.  . vitamin C (ASCORBIC ACID) 500 MG tablet Take 500 mg by mouth daily.   [name] zinc sulfate 220 (50 Zn) MG capsule Take 1 capsule (220 mg total) by mouth daily.  . [DISCONTINUED] losartan (COZAAR) 100 MG tablet Take 1 tablet (100 mg total) by mouth daily.      Review of Systems      Review of Systems  Constitutional: Negative for chills, fever and malaise/fatigue.  Cardiovascular: Positive for dyspnea on exertion and near-syncope. Negative for chest pain, leg swelling,  orthopnea, palpitations and syncope.  Respiratory: Negative for cough, shortness of breath and wheezing.   Gastrointestinal: Negative for nausea and vomiting.  Neurological: Positive for light-headedness. Negative for dizziness and weakness.   All other systems reviewed and are otherwise negative except as noted above.  Physical Exam    VS:  BP 121/68 (BP Location: Left Arm, Patient Position: Sitting, Cuff  Size: Normal)   Pulse 70   Ht 5\' 4"  (1.626 m)   Wt 157 lb (71.2 kg)   SpO2 94%   BMI 26.95 kg/m  , BMI Body mass index is 26.95 kg/m. GEN: Well nourished, well developed, in no acute distress. HEENT: normal. Neck: Supple, no JVD, carotid bruits, or masses. Cardiac: RRR, no murmurs, rubs, or gallops. No clubbing, cyanosis, edema.  Radials/DP/PT 2+ and equal bilaterally.  Respiratory:  Respirations regular and unlabored, clear to auscultation bilaterally. GI: Soft, nontender, nondistended, BS + x 4. MS: No deformity or atrophy. Skin: Warm and dry, no rash. Neuro:  Strength and sensation are intact. Psych: Normal affect.   Assessment & Plan    1. HTN/Orthostatic hypotension -  Positive orthostatic vitals on exam today. Reduce Losartan to 75mg  daily. Encouraged adequate hydration, slow position changes. Reduce antihypertensive regimen, as below. Plan for echocardiogram to evaluate for valvular abnormalities.   2. Paroxysmal atrial flutter - Noted in 2014. No known recurrent arrhythmia. Previous atrial flutter in setting of lung cancer and recent diagnosis of neuroendocrine tumor. Plan for 14 day ZIO to rule out arrhythmia in setting of near-syncope.   3. Lung cancer - Continue to follow with pulmonology and oncology.   4. Coronary artery calcification on CT/Aortic atherosclerosis - Stable finding. No anginal symptoms. EKG today with no acute ST/T wave changes. No indication for ischemic evaluation at this time.   5. DOE - Likely multifactorial deconditioning, lung cancer. Plan  for echo and ZIO as above to assess for cardiac etiology particularly as he had COVID PNA.   Disposition: Follow up in 5 week(s) with Dr. Rockey Situ or APP.   Loel Dubonnet, NP 11/06/2019, 8:34 PM

## 2019-11-06 NOTE — Telephone Encounter (Addendum)
Pre admit is scheduled for 11/20/19 between 8-1p. This will be a phone visit.  covid test will be 11/22/2019. Pt is aware and voiced his understanding. Nothing further is needed.

## 2019-11-06 NOTE — Patient Instructions (Addendum)
Medication Instructions:  Your physician has recommended you make the following change in your medication:   REDUCE Losartan to 75mg  daily for 1 week  *If you need a refill on your cardiac medications before your next appointment, please call your pharmacy*   Lab Work: No lab work today.   Testing/Procedures:  Your physician has requested that you have an echocardiogram. Echocardiography is a painless test that uses sound waves to create images of your heart. It provides your doctor with information about the size and shape of your heart and how well your heart's chambers and valves are working. This procedure takes approximately one hour. There are no restrictions for this procedure.  Please wear your ZIO monitor for 2 weeks.  Follow-Up: At [facility], you and your health needs are our priority.  As part of our continuing mission to provide you with exceptional heart care, we have created designated Provider Care Teams.  These Care Teams include your primary Cardiologist (physician) and Advanced Practice Providers (APPs -  Physician Assistants and Nurse Practitioners) who all work together to provide you with the care you need, when you need it.  We recommend signing up for the patient portal called "MyChart".  Sign up information is provided on this After Visit Summary.  MyChart is used to connect with patients for Virtual Visits (Telemedicine).  Patients are able to view lab/test results, encounter notes, upcoming appointments, etc.  Non-urgent messages can be sent to your provider as well.   To learn more about what you can do with MyChart, go to [email or web address].    Your next appointment:  In 4-6 weeks with Dr. Rockey Situ or APP  Other Instructions:   Orthostatic Hypotension Blood pressure is a measurement of how strongly, or weakly, your blood is pressing against the walls of your arteries. Orthostatic hypotension is a sudden drop in blood pressure that happens when you  quickly change positions, such as when you get up from sitting or lying down. Arteries are blood vessels that carry blood from your heart throughout your body. When blood pressure is too low, you may not get enough blood to your brain or to the rest of your organs. This can cause weakness, light-headedness, rapid heartbeat, and fainting. This can last for just a few seconds or for up to a few minutes. Orthostatic hypotension is usually not a serious problem. However, if it happens frequently or gets worse, it may be a sign of something more serious. What are the causes? This condition may be caused by:  Sudden changes in posture, such as standing up quickly after you have been sitting or lying down.  Blood loss.  Loss of body fluids (dehydration).  Heart problems.  Hormone (endocrine) problems.  Pregnancy.  Severe infection.  Lack of certain nutrients.  Severe allergic reactions (anaphylaxis).  Certain medicines, such as blood pressure medicine or medicines that make the body lose excess fluids (diuretics). Sometimes, this condition can be caused by not taking medicine as directed, such as taking too much of a certain medicine. What increases the risk? The following factors may make you more likely to develop this condition:  Age. Risk increases as you get older.  Conditions that affect the heart or the central nervous system.  Taking certain medicines, such as blood pressure medicine or diuretics.  Being pregnant. What are the signs or symptoms? Symptoms of this condition may include:  Weakness.  Light-headedness.  Dizziness.  Blurred vision.  Fatigue.  Rapid heartbeat.  Fainting, in severe  cases. How is this diagnosed? This condition is diagnosed based on:  Your medical history.  Your symptoms.  Your blood pressure measurement. Your health care provider will check your blood pressure when you are: ? Lying down. ? Sitting. ? Standing. A blood pressure  reading is recorded as two numbers, such as "120 over 80" (or 120/80). The first ("top") number is called the systolic pressure. It is a measure of the pressure in your arteries as your heart beats. The second ("bottom") number is called the diastolic pressure. It is a measure of the pressure in your arteries when your heart relaxes between beats. Blood pressure is measured in a unit called mm Hg. Healthy blood pressure for most adults is 120/80. If your blood pressure is below 90/60, you may be diagnosed with hypotension. Other information or tests that may be used to diagnose orthostatic hypotension include:  Your other vital signs, such as your heart rate and temperature.  Blood tests.  Tilt table test. For this test, you will be safely secured to a table that moves you from a lying position to an upright position. Your heart rhythm and blood pressure will be monitored during the test. How is this treated? This condition may be treated by:  Changing your diet. This may involve eating more salt (sodium) or drinking more water.  Taking medicines to raise your blood pressure.  Changing the dosage of certain medicines you are taking that might be lowering your blood pressure.  Wearing compression stockings. These stockings help to prevent blood clots and reduce swelling in your legs. In some cases, you may need to go to the hospital for:  Fluid replacement. This means you will receive fluids through an IV.  Blood replacement. This means you will receive donated blood through an IV (transfusion).  Treating an infection or heart problems, if this applies.  Monitoring. You may need to be monitored while medicines that you are taking wear off. Follow these instructions at home: Eating and drinking   Drink enough fluid to keep your urine pale yellow.  Eat a healthy diet, and follow instructions from your health care provider about eating or drinking restrictions. A healthy diet  includes: ? Fresh fruits and vegetables. ? Whole grains. ? Lean meats. ? Low-fat dairy products.  Eat extra salt only as directed. Do not add extra salt to your diet unless your health care provider told you to do that.  Eat frequent, small meals.  Avoid standing up suddenly after eating. Medicines  Take over-the-counter and prescription medicines only as told by your health care provider. ? Follow instructions from your health care provider about changing the dosage of your current medicines, if this applies. ? Do not stop or adjust any of your medicines on your own. General instructions   Wear compression stockings as told by your health care provider.  Get up slowly from lying down or sitting positions. This gives your blood pressure a chance to adjust.  Avoid hot showers and excessive heat as directed by your health care provider.  Return to your normal activities as told by your health care provider. Ask your health care provider what activities are safe for you.  Do not use any products that contain nicotine or tobacco, such as cigarettes, e-cigarettes, and chewing tobacco. If you need help quitting, ask your health care provider.  Keep all follow-up visits as told by your health care provider. This is important. Contact a health care provider if you:  Vomit.  Have diarrhea.  Have a fever for more than 2-3 days.  Feel more thirsty than usual.  Feel weak and tired. Get help right away if you:  Have chest pain.  Have a fast or irregular heartbeat.  Develop numbness in any part of your body.  Cannot move your arms or your legs.  Have trouble speaking.  Become sweaty or feel light-headed.  Faint.  Feel short of breath.  Have trouble staying awake.  Feel confused. Summary  Orthostatic hypotension is a sudden drop in blood pressure that happens when you quickly change positions.  Orthostatic hypotension is usually not a serious problem.  It is  diagnosed by having your blood pressure taken lying down, sitting, and then standing.  It may be treated by changing your diet or adjusting your medicines. This information is not intended to replace advice given to you by your health care provider. Make sure you discuss any questions you have with your health care provider. Document Revised: 10/20/2017 Document Reviewed: 10/20/2017 Elsevier Patient Education  [place].

## 2019-11-06 NOTE — Telephone Encounter (Signed)
Bronch with cry has been scheduled for 11/26/2019. Dx: Carcinoid Tumor Dx code: 90301, [place], please see bronch info. Thanks

## 2019-11-06 NOTE — Patient Instructions (Signed)
What we discussed today:   We will do the last cryotherapy to that area and your left lung on 19 July at 1 PM  We will see you in follow-up in 4 to 6 weeks time  We will give you a trial of Breo 100, 1 inhalation daily, make sure you rinse your mouth well after you use it you may use a little baking soda (sodium bicarbonate) in the rinsing water to make sure you do not get any thrush.  Let us know how this does for you.

## 2019-11-07 ENCOUNTER — Encounter: Payer: Self-pay | Admitting: Pulmonary Disease

## 2019-11-08 ENCOUNTER — Encounter: Payer: Self-pay | Admitting: *Deleted

## 2019-11-08 ENCOUNTER — Ambulatory Visit
Admission: RE | Admit: 2019-11-08 | Discharge: 2019-11-08 | Disposition: A | Discharge status: Home / Self Care | Payer: Medicare Other | Source: Ambulatory Visit | Attending: Interventional Radiology | Admitting: Interventional Radiology

## 2019-11-08 ENCOUNTER — Other Ambulatory Visit: Payer: Self-pay

## 2019-11-08 DIAGNOSIS — C7B8 Other secondary neuroendocrine tumors: Secondary | ICD-10-CM

## 2019-11-08 HISTORY — PX: IR RADIOLOGIST EVAL & MGMT: IMG5224

## 2019-11-08 LAB — CHROMOGRANIN A: Chromogranin A (ng/mL): 323.2 ng/mL — ABNORMAL HIGH (ref 0.0–101.8)

## 2019-11-08 NOTE — Progress Notes (Signed)
Patient ID: Jason Malone, male   DOB: 07-Oct-1940, 79 y.o.   MRN: [id number]       Chief Complaint:  65-month status post Y 90 radioembolization for metastatic neuroendocrine tumor to the liver  Referring Physician(s): [place]  History of Present Illness: Jason Malone is a 79 y.o. male with stage IV metastatic neuroendocrine tumor to the liver.  He is now 86-month status post right hepatic Y 90 embolization.  He also had a prior left hepatic Y 90 embolization.  From a liver disease standpoint he has been stable.  Most recent CT from 11/02/2019 demonstrates no disease progression in the liver and a stable residual dominant left hepatic lesion measures only 2.1 cm in greatest dimension.  Following the Y 90, there are minor signs of hepatic cirrhosis by CT.  No extrahepatic disease within the abdomen or pelvis.  Since our last visit he has had Covid 19 which he has recovered from.  CT does show residual chronic appearing pulmonary fibrosis bilaterally, worse in the right lung.  He is also had bronchoscopic resection of recurrent left mainstem bronchus carcinoid by Dr. Patsey Berthold.  Since this bronchoscopic procedure he seems to be doing fairly well and recovering.  No new complaints.  No recent illness or fever.  Past Medical History:  Diagnosis Date  . Allergic rhinitis   . Benign prostatic hypertrophy with lower urinary tract symptoms (LUTS)   . Chest pain    a. 05/2013 MV: EF 70%, no ischemia.  . Diverticulitis    DIVERTICULOSIS  . Enlarged prostate   . Essential hypertension    CONTROLLED ON MEDS  . Full dentures    upper and lower  . H/O hypokalemia   . Hx of atrial fibrillation without current medication    CARDIOLOGIST-DR [facility]  . 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 Hyperlipemia   . Hypomagnesemia   . IFG (impaired fasting glucose)   . Liver cancer ([place])   . Liver cancer ([place]) 12/08/2016  . Liver mass, left lobe 08/28/2016   Probably  hemangioma; will get MRI of liver, refer to GI  . Lung cancer ([place])    a. carcinoid, left lung, Stage 1b (T2a, N0, cM0);  b. 05/2013 s/p VATS & LULobectomy.  . Monocytosis   . Osteopenia   . Paroxysmal atrial flutter ([facility])    a. 03/2013->no recurrence;  b. CHA2DS2VASc = 2-->not currently on anticoagulation;  c. 05/2012 Echo: EF 55-60%, normal RV.  [name] Wears glasses     Past Surgical History:  Procedure Laterality Date  . BRONCHOSCOPY     04/06/2013  . CARDIOVASCULAR STRESS TEST  05/2013   a. No evidence of ischemia or infarct, EF 70%, no WMAs  . CATARACT EXTRACTION W/PHACO Left 07/14/2015   Procedure: CATARACT EXTRACTION PHACO AND INTRAOCULAR LENS PLACEMENT (IOC);  Surgeon: [name], MD;  Location: [place];  Service: Ophthalmology;  Laterality: Left;  . CATARACT EXTRACTION W/PHACO Right 10/01/2015   Procedure: CATARACT EXTRACTION PHACO AND INTRAOCULAR LENS PLACEMENT (IOC);  Surgeon: [name], MD;  Location: [place];  Service: Ophthalmology;  Laterality: Right;  . COLONOSCOPY W/ POLYPECTOMY     bleed after-had to go to surgery to stop bleeding via colonoscopy  . COLONOSCOPY WITH PROPOFOL N/A 11/19/2015   Procedure: COLONOSCOPY WITH PROPOFOL;  Surgeon: [name], MD;  Location: [facility];  Service: Endoscopy;  Laterality: N/A;  . DUPUYTREN CONTRACTURE RELEASE  12/15/2011   Procedure: DUPUYTREN CONTRACTURE RELEASE;  Surgeon: [name], MD;  Location: [place];  Service: Orthopedics;  Laterality: Left;  Fasciotomy left ring finger dupuytrens  . ELECTROMAGNETIC NAVIGATION BROCHOSCOPY Left 09/19/2019   Procedure: ELECTROMAGNETIC NAVIGATION BRONCHOSCOPY;  Surgeon: Tyler Pita, MD;  Location: [facility];  Service: Cardiopulmonary;  Laterality: Left;  . FASCIECTOMY Right 11/07/2018   Procedure: SEGMENTAL FASCIECTOMY RIGHT RING FINGER;  Surgeon: Daryll Brod, MD;  Location: [place];  Service: Orthopedics;   Laterality: Right;  AXILLARY BLOCK  . IR ANGIOGRAM SELECTIVE EACH ADDITIONAL VESSEL  07/26/2017  . IR ANGIOGRAM SELECTIVE EACH ADDITIONAL VESSEL  07/26/2017  . IR ANGIOGRAM SELECTIVE EACH ADDITIONAL VESSEL  07/26/2017  . IR ANGIOGRAM SELECTIVE EACH ADDITIONAL VESSEL  07/26/2017  . IR ANGIOGRAM SELECTIVE EACH ADDITIONAL VESSEL  07/26/2017  . IR ANGIOGRAM SELECTIVE EACH ADDITIONAL VESSEL  08/11/2017  . IR ANGIOGRAM SELECTIVE EACH ADDITIONAL VESSEL  04/28/2018  . IR ANGIOGRAM VISCERAL SELECTIVE  07/26/2017  . IR ANGIOGRAM VISCERAL SELECTIVE  08/11/2017  . IR ANGIOGRAM VISCERAL SELECTIVE  04/28/2018  . IR EMBO ARTERIAL NOT HEMORR HEMANG INC GUIDE ROADMAPPING  07/26/2017  . IR EMBO TUMOR ORGAN ISCHEMIA INFARCT INC GUIDE ROADMAPPING  08/11/2017  . IR EMBO TUMOR ORGAN ISCHEMIA INFARCT INC GUIDE ROADMAPPING  04/28/2018  . IR RADIOLOGIST EVAL & MGMT  07/07/2017  . IR RADIOLOGIST EVAL & MGMT  09/07/2017  . IR RADIOLOGIST EVAL & MGMT  12/21/2017  . IR RADIOLOGIST EVAL & MGMT  03/22/2018  . IR RADIOLOGIST EVAL & MGMT  06/08/2018  . IR RADIOLOGIST EVAL & MGMT  11/01/2018  . IR RADIOLOGIST EVAL & MGMT  03/21/2019  . IR US GUIDE VASC ACCESS RIGHT  07/26/2017  . IR US GUIDE VASC ACCESS RIGHT  08/11/2017  . IR US GUIDE VASC ACCESS RIGHT  04/28/2018  . LUNG LOBECTOMY  06/10/13   upper left lung  . MULTIPLE TOOTH EXTRACTIONS    . REMOVAL RETAINED LENS Right 11/17/2015   Procedure: REMOVAL RETAINED LENS FROAGMENTS RIGHT EYE;  Surgeon: [name], MD;  Location: [place];  Service: Ophthalmology;  Laterality: Right;  . TONSILLECTOMY    . UPPER GASTROINTESTINAL ENDOSCOPY  11-03-15   Dr [name]  . VASECTOMY    . VIDEO ASSISTED THORACOSCOPY (VATS)/WEDGE RESECTION Left 06/04/2013   Procedure: VIDEO ASSISTED THORACOSCOPY (VATS)/WEDGE RESECTION;  Surgeon: [name], MD;  Location: [place];  Service: Thoracic;  Laterality: Left;  [name] VIDEO BRONCHOSCOPY N/A 06/04/2013   Procedure: VIDEO BRONCHOSCOPY;  Surgeon:  [name], MD;  Location: [facility];  Service: Thoracic;  Laterality: N/A;    Allergies: Tape  Medications: Prior to Admission medications   Medication Sig Start Date End Date Taking? Authorizing Provider  aspirin EC 81 MG tablet Take 81 mg by mouth daily.    [provider]  atorvastatin (LIPITOR) 40 MG tablet TAKE 1 TABLET BY MOUTH ONCE DAILY FOR CHOLESTEROL Patient taking differently: Take 40 mg by mouth daily. TAKE 1 TABLET BY MOUTH ONCE DAILY FOR CHOLESTEROL 10/25/19   Lebron Conners D, MD  b complex vitamins capsule Take 1 capsule by mouth daily.    [provider]  cholecalciferol (VITAMIN D3) 25 MCG (1000 UNIT) tablet Take 1,000 Units by mouth daily.    [provider]  Cinnamon 500 MG capsule Take 500 mg by mouth at bedtime.     [provider]  diphenhydrAMINE (BENADRYL) 25 mg capsule Take 25 mg by mouth every morning.     [provider]  DM-APAP-CPM (CORICIDIN HBP PO) Take 2 tablets by mouth daily as needed (allergies/cold symptoms).    [provider]  finasteride (PROSCAR) 5 MG tablet Take 1 tablet (5 mg total) by mouth daily. 10/25/19   Towanda Malkin, MD  fluticasone furoate-vilanterol (BREO ELLIPTA) 100-25 MCG/INH AEPB Inhale 1 puff into the lungs daily. 11/06/19   Tyler Pita, MD  Glucose Blood (BLOOD GLUCOSE TEST STRIPS) STRP Use as directed to monitor FSBS once daily. Dx: E11.9. 07/10/19   Delsa Grana, PA-C  losartan (COZAAR) 50 MG tablet Take 1.5 tablets (75 mg total) by mouth daily. 11/06/19 02/04/20  Loel Dubonnet, NP  Multiple Vitamins-Minerals (MULTIVITAMIN WITH MINERALS) tablet Take 1 tablet by mouth daily.     [provider]  polycarbophil (FIBERCON) 625 MG tablet Take 625 mg by mouth 2 (two) times daily.     [provider]  tamsulosin (FLOMAX) 0.4 MG CAPS capsule Take 2 capsules (0.8 mg total) by mouth at bedtime. 09/27/19   Delsa Grana, PA-C  vitamin C (ASCORBIC  ACID) 500 MG tablet Take 500 mg by mouth daily.     [provider]  zinc sulfate 220 (50 Zn) MG capsule Take 1 capsule (220 mg total) by mouth daily. 06/18/19   Allie Bossier, MD     Family History  Problem Relation Age of Onset  . Stroke Mother   . Alzheimer's disease Mother   . Hypertension Sister   . Hyperlipidemia Sister   . Hypertension Brother   . Hyperlipidemia Brother   . Diabetes Brother     Social History   Socioeconomic History  . Marital status: Married    Spouse name: [name]  . Number of children: 3  . Years of education: Not on file  . Highest education level: High school graduate  Occupational History  . Not on file  Tobacco Use  . Smoking status: Former Smoker    Packs/day: 1.00    Years: 40.00    Pack years: 40.00    Types: Cigarettes    Quit date: 12/09/1998    Years since quitting: 20.9  . Smokeless tobacco: Never Used  Vaping Use  . Vaping Use: Never used  Substance and Sexual Activity  . Alcohol use: No  . Drug use: No  . Sexual activity: Yes    Partners: Female  Other Topics Concern  . Not on file  Social History Narrative  . Not on file   Social Determinants of Health   Financial Resource Strain:   . Difficulty of Paying Living Expenses:   Food Insecurity:   . Worried About Charity fundraiser in the Last Year:   . Arboriculturist in the Last Year:   Transportation Needs:   . Film/video editor (Medical):   [name] Lack of Transportation (Non-Medical):   Physical Activity:   . Days of Exercise per Week:   . Minutes of Exercise per Session:   Stress:   . Feeling of Stress :   Social Connections:   . Frequency of Communication with Friends and Family:   . Frequency of Social Gatherings with Friends and Family:   . Attends Religious Services:   . Active Member of Clubs or Organizations:   . Attends Archivist Meetings:   [name] Marital Status:     ECOG Status: 2 - Symptomatic, <50% confined to bed  Review of  Systems  Review of Systems: A 12 point ROS discussed and pertinent positives  are indicated in the HPI above.  All other systems are negative.  Physical Exam No direct physical exam was performed telephone health visit only today to review imaging Vital Signs: There were no vitals taken for this visit.  Imaging: CT CHEST W CONTRAST  Result Date: 11/02/2019 CLINICAL DATA:  Restaging of stage IVa neuroendocrine tumor of the lung metastatic to the liver. Prior left upper lobectomy May 25, 2013. Most recent Y 90 ablation April 28, 2018. Left mainstem bronchus endobronchial mass consistent with neuroendocrine tumor, interval ablation therapies. Lanreotide therapy. EXAM: CT CHEST, ABDOMEN, AND PELVIS WITH CONTRAST TECHNIQUE: Multidetector CT imaging of the chest, abdomen and pelvis was performed following the standard protocol during bolus administration of intravenous contrast. CONTRAST:  171mL OMNIPAQUE IOHEXOL 300 MG/ML  SOLN COMPARISON:  Multiple exams, including 08/15/2019 07/03/2019. FINDINGS: CT CHEST FINDINGS Cardiovascular: Coronary, aortic arch, and branch vessel atherosclerotic vascular disease. No pathologic adenopathy. Mediastinum/Nodes: Unremarkable Lungs/Pleura: Emphysema and scattered scarring with subpleural reticulation especially in the right lung which is moderately improved from prior. Scattered subpleural spurring particularly in the right lung. Some of the previous ground-glass opacities in the right lung have resolved. Calcified right upper lobe pulmonary nodule compatible with granuloma, stable. Left upper lobectomy. The previous bilobed lesions in the left mainstem bronchus appear resolved or at least markedly reduced, with only minimal residual wall irregularity in this vicinity on images 70 through 73 of series 3, indicating interval highly effective therapy. No new nodule identified. Musculoskeletal: Healing fracture of the left anterior eleventh rib on image 163/3. This is  just proximal to the costochondral junction. Mild sclerosis anteriorly in the right sixth rib, not changed from prior exams. Thoracic spondylosis. 1.0 cm rim sclerotic lesion in the T11 vertebral body eccentric to the left, image 109/204, nonspecific, not changed from 08/15/2019. Multiple Schmorl's nodes in the thoracic spine. CT ABDOMEN PELVIS FINDINGS Hepatobiliary: Partially exophytic 2.0 cm lesion with some central necrosis anteriorly segment 3 of the liver on image 55/2, no change from 07/03/2019, consistent with known metastatic disease. 3 mm hypodense lesion in the right hepatic lobe on image 62/2, stable. Calcified gallstones in the gallbladder neck. Subtle transient hepatic attenuation difference posteriorly along the dome of the right hepatic lobe on image 48/4 without a well-defined underlying lesion observed. Pancreas: Unremarkable Spleen: 1.2 cm hypodensity in the spleen on image 55/2 is retrospectively stable across prior exams including 03/15/2019, probably benign but attention on follow up is suggested. Adrenals/Urinary Tract: Right mid kidney scarring laterally. Left renal atrophy. 5 mm hypodense lesion left kidney upper pole is technically too small to characterize although statistically likely to be a cyst. Mildly irregular 1.9 by 0.6 by 0.2 cm hyperdensity dependently in the urinary bladder. This has a very flat appearance and is not highly characteristic of bladder calculus, but is new compared to 08/15/2019, query retained Foley catheter balloon fragment versus interval urologic procedure, correlate with patient history. Stomach/Bowel: Sigmoid colon diverticulosis. No active diverticulitis. No dilated bowel. Vascular/Lymphatic: Aortoiliac atherosclerotic vascular disease. No pathologic adenopathy. Reproductive: Stable prostatomegaly. Other: No supplemental non-categorized findings. Musculoskeletal: Stable small sclerotic lesions in the bony pelvis and lumbar spine. Some of these resemble bone  islands. A more rim sclerotic 0.9 cm lesion anteriorly in the L2 vertebral body is stable. Small lesion along the inferior endplate of L3 is chronic and shown on prior MRI examinations. IMPRESSION: 1. Resolution of the previous bilobed lesions in the left mainstem bronchus, with only minimal residual wall irregularity in this vicinity currently. 2. Stable  appearance of the known metastatic disease to the liver. 3. Healing fracture of the left anterior eleventh rib. 4. Other imaging findings of potential clinical significance: Coronary, aortic arch, and branch vessel atherosclerotic vascular disease. Emphysema and scattered scarring with subpleural reticulation especially in the right lung which is moderately improved from prior. Cholelithiasis. Left renal atrophy. Sigmoid colon diverticulosis. Stable prostatomegaly. Several chronic but nonspecific bony lesions which may well be degenerative/benign but which merit surveillance. 5. Emphysema and aortic atherosclerosis. Aortic Atherosclerosis (ICD10-I70.0) and Emphysema (ICD10-J43.9). Electronically Signed   By: Van Clines M.D.   On: 11/02/2019 14:31   CT Abdomen Pelvis W Contrast  Result Date: 11/02/2019 CLINICAL DATA:  Restaging of stage IVa neuroendocrine tumor of the lung metastatic to the liver. Prior left upper lobectomy May 25, 2013. Most recent Y 90 ablation April 28, 2018. Left mainstem bronchus endobronchial mass consistent with neuroendocrine tumor, interval ablation therapies. Lanreotide therapy. EXAM: CT CHEST, ABDOMEN, AND PELVIS WITH CONTRAST TECHNIQUE: Multidetector CT imaging of the chest, abdomen and pelvis was performed following the standard protocol during bolus administration of intravenous contrast. CONTRAST:  12mL OMNIPAQUE IOHEXOL 300 MG/ML  SOLN COMPARISON:  Multiple exams, including 08/15/2019 07/03/2019. FINDINGS: CT CHEST FINDINGS Cardiovascular: Coronary, aortic arch, and branch vessel atherosclerotic vascular disease.  No pathologic adenopathy. Mediastinum/Nodes: Unremarkable Lungs/Pleura: Emphysema and scattered scarring with subpleural reticulation especially in the right lung which is moderately improved from prior. Scattered subpleural spurring particularly in the right lung. Some of the previous ground-glass opacities in the right lung have resolved. Calcified right upper lobe pulmonary nodule compatible with granuloma, stable. Left upper lobectomy. The previous bilobed lesions in the left mainstem bronchus appear resolved or at least markedly reduced, with only minimal residual wall irregularity in this vicinity on images 70 through 73 of series 3, indicating interval highly effective therapy. No new nodule identified. Musculoskeletal: Healing fracture of the left anterior eleventh rib on image 163/3. This is just proximal to the costochondral junction. Mild sclerosis anteriorly in the right sixth rib, not changed from prior exams. Thoracic spondylosis. 1.0 cm rim sclerotic lesion in the T11 vertebral body eccentric to the left, image 109/204, nonspecific, not changed from 08/15/2019. Multiple Schmorl's nodes in the thoracic spine. CT ABDOMEN PELVIS FINDINGS Hepatobiliary: Partially exophytic 2.0 cm lesion with some central necrosis anteriorly segment 3 of the liver on image 55/2, no change from 07/03/2019, consistent with known metastatic disease. 3 mm hypodense lesion in the right hepatic lobe on image 62/2, stable. Calcified gallstones in the gallbladder neck. Subtle transient hepatic attenuation difference posteriorly along the dome of the right hepatic lobe on image 48/4 without a well-defined underlying lesion observed. Pancreas: Unremarkable Spleen: 1.2 cm hypodensity in the spleen on image 55/2 is retrospectively stable across prior exams including 03/15/2019, probably benign but attention on follow up is suggested. Adrenals/Urinary Tract: Right mid kidney scarring laterally. Left renal atrophy. 5 mm hypodense lesion  left kidney upper pole is technically too small to characterize although statistically likely to be a cyst. Mildly irregular 1.9 by 0.6 by 0.2 cm hyperdensity dependently in the urinary bladder. This has a very flat appearance and is not highly characteristic of bladder calculus, but is new compared to 08/15/2019, query retained Foley catheter balloon fragment versus interval urologic procedure, correlate with patient history. Stomach/Bowel: Sigmoid colon diverticulosis. No active diverticulitis. No dilated bowel. Vascular/Lymphatic: Aortoiliac atherosclerotic vascular disease. No pathologic adenopathy. Reproductive: Stable prostatomegaly. Other: No supplemental non-categorized findings. Musculoskeletal: Stable small sclerotic lesions in the bony pelvis and  lumbar spine. Some of these resemble bone islands. A more rim sclerotic 0.9 cm lesion anteriorly in the L2 vertebral body is stable. Small lesion along the inferior endplate of L3 is chronic and shown on prior MRI examinations. IMPRESSION: 1. Resolution of the previous bilobed lesions in the left mainstem bronchus, with only minimal residual wall irregularity in this vicinity currently. 2. Stable appearance of the known metastatic disease to the liver. 3. Healing fracture of the left anterior eleventh rib. 4. Other imaging findings of potential clinical significance: Coronary, aortic arch, and branch vessel atherosclerotic vascular disease. Emphysema and scattered scarring with subpleural reticulation especially in the right lung which is moderately improved from prior. Cholelithiasis. Left renal atrophy. Sigmoid colon diverticulosis. Stable prostatomegaly. Several chronic but nonspecific bony lesions which may well be degenerative/benign but which merit surveillance. 5. Emphysema and aortic atherosclerosis. Aortic Atherosclerosis (ICD10-I70.0) and Emphysema (ICD10-J43.9). Electronically Signed   By: Van Clines M.D.   On: 11/02/2019 14:31     Labs:  CBC: Recent Labs    08/13/19 0920 09/10/19 1019 10/09/19 0918 11/06/19 1257  WBC 8.0 8.9 10.9* 19.0*  HGB 13.6 15.0 15.0 15.0  HCT 42.1 46.8 45.2 45.4  PLT 203 196 188 163    COAGS: Recent Labs    07/03/19 0139  INR 1.1  APTT 30    BMP: Recent Labs    08/13/19 0920 09/10/19 1019 10/09/19 0918 11/06/19 1257  NA 139 140 141 135  K 3.9 5.0 4.1 4.4  CL 102 103 106 102  CO2 29 30 27 26   GLUCOSE 201* 171* 93 176*  BUN 15 17 24* 21  CALCIUM 9.2 9.6 9.3 8.8*  CREATININE 0.78 0.84 0.79 1.22  GFRNONAA >60 >60 >60 57*  GFRAA >60 >60 >60 >60    LIVER FUNCTION TESTS: Recent Labs    08/13/19 0920 09/10/19 1019 10/09/19 0918 11/06/19 1257  BILITOT 0.6 0.9 0.5 0.8  AST 27 21 29 29   ALT 27 16 36 30  ALKPHOS 178* 116 143* 135*  PROT 6.8 7.0 6.8 6.8  ALBUMIN 3.3* 4.0 3.9 3.9    TUMOR MARKERS: No results for input(s): AFPTM, CEA, CA199, CHROMGRNA in the last 8760 hours.  Assessment and Plan:  Stage IV metastatic neuroendocrine tumor to the liver.  Status post whole liver Y 90 embolization.  Most recent procedure was December 2019 approximately 18 months ago.  Surveillance imaging demonstrates stable residual hepatic disease with a 2.1 cm left hepatic lesion (unchanged).  No extrahepatic disease.  No signs of liver failure.  Minor cirrhotic changes noted of the liver by CT.  Plan: Stable hepatic metastatic disease following Y 90 embolization.  Currently no need for additional Y 90 embolization.  Continue surveillance imaging either with CT or MRI at 6 months intervals.    Electronically Signed: Greggory Keen 11/08/2019, 11:17 AM   I spent a total of    25 Minutes in remote  clinical consultation, greater than 50% of which was counseling/coordinating care for this patient with metastatic neuroendocrine tumor to the liver.    Visit type: Audio only (telephone). Audio (no video) only due to patient's lack of internet/smartphone capability. Alternative  for in-person consultation at [facility], [address], [place], [place]. This visit type was conducted due to national recommendations for restrictions regarding the COVID-19 Pandemic (e.g. social distancing).  This format is felt to be most appropriate for this patient at this time.  All issues noted in this document were discussed and addressed.

## 2019-11-20 ENCOUNTER — Other Ambulatory Visit: Payer: Self-pay

## 2019-11-20 ENCOUNTER — Other Ambulatory Visit
Admission: RE | Admit: 2019-11-20 | Discharge: 2019-11-20 | Disposition: A | Discharge status: Home / Self Care | Payer: Medicare Other | Source: Ambulatory Visit | Attending: Pulmonary Disease | Admitting: Pulmonary Disease

## 2019-11-20 NOTE — Patient Instructions (Signed)
Your procedure is scheduled on: Monday November 26, 2019. Report to Day Surgery inside [place] 2nd floor. To find out your arrival time please call [phone] between 1PM - 3PM on Friday November 23, 2019.  Remember: Instructions that are not followed completely may result in serious medical risk,  up to and including death, or upon the discretion of your surgeon and anesthesiologist your  surgery may need to be rescheduled.     _X__ 1. Do not eat food after midnight the night before your procedure.                 No gum chewing or hard candies. You may drink clear liquids up to 2 hours                 before you are scheduled to arrive for your surgery- DO not drink clear                 liquids within 2 hours of the start of your surgery.                 Clear Liquids include:  water, apple juice without pulp, clear Gatorade, G2 or                  Gatorade Zero (avoid Red/Purple/Blue), Black Coffee or Tea (Do not add                 anything to coffee or tea).  __X__2.  On the morning of surgery brush your teeth with toothpaste and water, you                may rinse your mouth with mouthwash if you wish.  Do not swallow any toothpaste of mouthwash.     _X__ 3.  No Alcohol for 24 hours before or after surgery.   _X__ 4.  Do Not Smoke or use e-cigarettes For 24 Hours Prior to Your Surgery.                 Do not use any chewable tobacco products for at least 6 hours prior to                 Surgery.  _X__  5.  Do not use any recreational drugs (marijuana, cocaine, heroin, ecstacy, MDMA or other)                For at least one week prior to your surgery.  Combination of these drugs with anesthesia                May have life threatening results.  __X__ 6.  Notify your doctor if there is any change in your medical condition      (cold, fever, infections).     Do not wear jewelry, make-up, hairpins, clips or nail polish. Do not wear lotions, powders, or  perfumes. You may wear deodorant. Do not shave 48 hours prior to surgery. Men may shave face and neck. Do not bring valuables to the hospital.    [facility] is not responsible for any belongings or valuables.  Contacts, dentures or bridgework may not be worn into surgery. Leave your suitcase in the car. After surgery it may be brought to your room. For patients admitted to the hospital, discharge time is determined by your treatment team.   Patients discharged the day of surgery will not be allowed to drive home.   Make arrangements for someone to be  with you for the first 24 hours of your Same Day Discharge.   ____ Take these medicines the morning of surgery with A SIP OF WATER:    1. None      __X__ Use inhalers on the day of surgery  fluticasone furoate-vilanterol (BREO ELLIPTA) 100-25 MCG/INH AEPB  __X__ Stop aspirin as instructed by provider.   __X__ Stop Anti-inflammatories such as Ibuprofen, Aleve, Advil, naproxen, and or BC powders.   __X__ Stop supplements until after surgery.    __X__ Do not start any herbal supplements before your surgery.

## 2019-11-22 ENCOUNTER — Other Ambulatory Visit: Payer: Self-pay

## 2019-11-22 ENCOUNTER — Other Ambulatory Visit
Admission: RE | Admit: 2019-11-22 | Discharge: 2019-11-22 | Disposition: A | Discharge status: Home / Self Care | Payer: Medicare Other | Source: Ambulatory Visit | Attending: Pulmonary Disease | Admitting: Pulmonary Disease

## 2019-11-22 DIAGNOSIS — Z20822 Contact with and (suspected) exposure to covid-19: Secondary | ICD-10-CM | POA: Diagnosis not present

## 2019-11-22 DIAGNOSIS — Z01812 Encounter for preprocedural laboratory examination: Secondary | ICD-10-CM | POA: Diagnosis present

## 2019-11-22 LAB — SARS CORONAVIRUS 2 (TAT 6-24 HRS): SARS Coronavirus 2: NEGATIVE

## 2019-11-23 ENCOUNTER — Telehealth: Payer: Self-pay | Admitting: Pulmonary Disease

## 2019-11-23 MED ORDER — AZITHROMYCIN 250 MG PO TABS: ORAL_TABLET | ORAL | 0 refills | Status: AC

## 2019-11-23 NOTE — Telephone Encounter (Signed)
Dr. Please see below message and advise. Thanks

## 2019-11-23 NOTE — Telephone Encounter (Signed)
Yes he can take it with a sip of water the morning of the procedure however if he starts that this afternoon it can easily be given after the procedure without any difficulty

## 2019-11-23 NOTE — Telephone Encounter (Signed)
Pt is aware below message/recommendations. Rx for zpak has been sent to preferred pharmacy. Nothing further needed at this time.

## 2019-11-23 NOTE — Telephone Encounter (Signed)
Pt is aware of below message and voiced his understanding. Nothing further is needed.  

## 2019-11-23 NOTE — Telephone Encounter (Signed)
This should be fine.  We will call him in a Z-Pak.  If I see anything that looks infectious we can culture it at the time of the procedure.  If loss is not having fever should be okay.

## 2019-11-23 NOTE — Telephone Encounter (Signed)
Pt is scheduled for bronch 11/26/2019.  Pt stated that he developed a cough one week ago. Cough is dry but at times prod with yellow mucus. He is also experiencing  nasal drainage clear in color. Sob is baseline per pt.  Pt is questioning if bronch can be preformed with his cough?  Dr. Patsey Berthold, please advise. Thanks

## 2019-11-26 ENCOUNTER — Ambulatory Visit
Admission: RE | Admit: 2019-11-26 | Discharge: 2019-11-26 | Disposition: A | Discharge status: Home / Self Care | Payer: Medicare Other | Attending: Pulmonary Disease | Admitting: Pulmonary Disease

## 2019-11-26 ENCOUNTER — Ambulatory Visit: Payer: Medicare Other | Admitting: Anesthesiology

## 2019-11-26 ENCOUNTER — Other Ambulatory Visit: Payer: Self-pay

## 2019-11-26 ENCOUNTER — Encounter
Admission: RE | Disposition: A | Discharge status: Home / Self Care | Payer: Self-pay | Source: Home / Self Care | Attending: Pulmonary Disease

## 2019-11-26 ENCOUNTER — Encounter: Payer: Self-pay | Admitting: Pulmonary Disease

## 2019-11-26 DIAGNOSIS — J439 Emphysema, unspecified: Secondary | ICD-10-CM | POA: Diagnosis not present

## 2019-11-26 DIAGNOSIS — Z7982 Long term (current) use of aspirin: Secondary | ICD-10-CM | POA: Insufficient documentation

## 2019-11-26 DIAGNOSIS — C787 Secondary malignant neoplasm of liver and intrahepatic bile duct: Secondary | ICD-10-CM | POA: Diagnosis not present

## 2019-11-26 DIAGNOSIS — R0600 Dyspnea, unspecified: Secondary | ICD-10-CM | POA: Diagnosis not present

## 2019-11-26 DIAGNOSIS — Z8616 Personal history of COVID-19: Secondary | ICD-10-CM | POA: Insufficient documentation

## 2019-11-26 DIAGNOSIS — C7A8 Other malignant neuroendocrine tumors: Secondary | ICD-10-CM

## 2019-11-26 DIAGNOSIS — Z79899 Other long term (current) drug therapy: Secondary | ICD-10-CM | POA: Insufficient documentation

## 2019-11-26 DIAGNOSIS — Z87891 Personal history of nicotine dependence: Secondary | ICD-10-CM | POA: Insufficient documentation

## 2019-11-26 DIAGNOSIS — D3A090 Benign carcinoid tumor of the bronchus and lung: Secondary | ICD-10-CM | POA: Diagnosis not present

## 2019-11-26 HISTORY — PX: FLEXIBLE BRONCHOSCOPY: SHX5094

## 2019-11-26 SURGERY — Surgical Case
Anesthesia: *Unknown

## 2019-11-26 SURGERY — BRONCHOSCOPY, FLEXIBLE
Anesthesia: General | Laterality: Left

## 2019-11-26 MED ORDER — OXYCODONE HCL 5 MG/5ML PO SOLN: 5.0000 mg | Freq: Once | ORAL | Status: DC | PRN

## 2019-11-26 MED ORDER — SODIUM CHLORIDE (PF) 0.9 % IJ SOLN
INTRAMUSCULAR | Status: AC
  Filled 2019-11-26: qty 10

## 2019-11-26 MED ORDER — CHLORHEXIDINE GLUCONATE 0.12 % MT SOLN: 15.0000 mL | Freq: Once | OROMUCOSAL | Status: AC

## 2019-11-26 MED ORDER — ONDANSETRON HCL 4 MG/2ML IJ SOLN
INTRAMUSCULAR | Status: DC | PRN
  Administered 2019-11-26: 4 mg via INTRAVENOUS

## 2019-11-26 MED ORDER — DEXAMETHASONE SODIUM PHOSPHATE 10 MG/ML IJ SOLN
INTRAMUSCULAR | Status: DC | PRN
  Administered 2019-11-26: 10 mg via INTRAVENOUS

## 2019-11-26 MED ORDER — ROCURONIUM BROMIDE 10 MG/ML (PF) SYRINGE
PREFILLED_SYRINGE | INTRAVENOUS | Status: AC
  Filled 2019-11-26: qty 10

## 2019-11-26 MED ORDER — ORAL CARE MOUTH RINSE: 15.0000 mL | Freq: Once | OROMUCOSAL | Status: AC

## 2019-11-26 MED ORDER — PHENYLEPHRINE HCL (PRESSORS) 10 MG/ML IV SOLN
INTRAVENOUS | Status: DC | PRN
  Administered 2019-11-26 (×2): 100 ug via INTRAVENOUS

## 2019-11-26 MED ORDER — SUGAMMADEX SODIUM 200 MG/2ML IV SOLN
INTRAVENOUS | Status: DC | PRN
  Administered 2019-11-26: 200 mg via INTRAVENOUS

## 2019-11-26 MED ORDER — FENTANYL CITRATE (PF) 100 MCG/2ML IJ SOLN: 25.0000 ug | INTRAMUSCULAR | Status: DC | PRN

## 2019-11-26 MED ORDER — FAMOTIDINE 20 MG PO TABS
ORAL_TABLET | ORAL | Status: AC
  Administered 2019-11-26: 20 mg via ORAL
  Filled 2019-11-26: qty 1

## 2019-11-26 MED ORDER — LIDOCAINE HCL (CARDIAC) PF 100 MG/5ML IV SOSY
PREFILLED_SYRINGE | INTRAVENOUS | Status: DC | PRN
  Administered 2019-11-26: 80 mg via INTRAVENOUS

## 2019-11-26 MED ORDER — PROPOFOL 10 MG/ML IV BOLUS
INTRAVENOUS | Status: DC | PRN
  Administered 2019-11-26: 100 mg via INTRAVENOUS

## 2019-11-26 MED ORDER — SUCCINYLCHOLINE CHLORIDE 200 MG/10ML IV SOSY
PREFILLED_SYRINGE | INTRAVENOUS | Status: AC
  Filled 2019-11-26: qty 10

## 2019-11-26 MED ORDER — PROPOFOL 10 MG/ML IV BOLUS
INTRAVENOUS | Status: AC
  Filled 2019-11-26: qty 20

## 2019-11-26 MED ORDER — SODIUM CHLORIDE 0.9 % IV SOLN: Freq: Once | INTRAVENOUS | Status: DC

## 2019-11-26 MED ORDER — ROCURONIUM BROMIDE 100 MG/10ML IV SOLN
INTRAVENOUS | Status: DC | PRN
  Administered 2019-11-26: 50 mg via INTRAVENOUS

## 2019-11-26 MED ORDER — FENTANYL CITRATE (PF) 100 MCG/2ML IJ SOLN
INTRAMUSCULAR | Status: AC
  Filled 2019-11-26: qty 2

## 2019-11-26 MED ORDER — SODIUM CHLORIDE 0.9 % IV SOLN: INTRAVENOUS | Status: DC

## 2019-11-26 MED ORDER — OXYCODONE HCL 5 MG PO TABS: 5.0000 mg | ORAL_TABLET | Freq: Once | ORAL | Status: DC | PRN

## 2019-11-26 MED ORDER — FAMOTIDINE 20 MG PO TABS: 20.0000 mg | ORAL_TABLET | Freq: Once | ORAL | Status: AC

## 2019-11-26 MED ORDER — CHLORHEXIDINE GLUCONATE 0.12 % MT SOLN
OROMUCOSAL | Status: AC
Start: 2019-11-26 — End: 2019-11-26
  Administered 2019-11-26: 15 mL via OROMUCOSAL
  Filled 2019-11-26: qty 15

## 2019-11-26 MED ORDER — FENTANYL CITRATE (PF) 100 MCG/2ML IJ SOLN
INTRAMUSCULAR | Status: DC | PRN
  Administered 2019-11-26 (×2): 50 ug via INTRAVENOUS

## 2019-11-26 MED ORDER — EPHEDRINE SULFATE 50 MG/ML IJ SOLN
INTRAMUSCULAR | Status: DC | PRN
  Administered 2019-11-26 (×3): 10 mg via INTRAVENOUS

## 2019-11-26 MED ORDER — BUTAMBEN-TETRACAINE-BENZOCAINE 2-2-14 % EX AERO
1.0000 | INHALATION_SPRAY | Freq: Once | CUTANEOUS | Status: DC
  Filled 2019-11-26: qty 20

## 2019-11-26 NOTE — Anesthesia Preprocedure Evaluation (Signed)
Anesthesia Evaluation  Patient identified by MRN, date of birth, ID band Patient awake    Reviewed: Allergy & Precautions, H&P , NPO status , Patient's Chart, lab work & pertinent test results, reviewed documented beta blocker date and time   Airway Mallampati: II  TM Distance: <3 FB Neck ROM: limited    Dental  (+) Edentulous Upper, Edentulous Lower, Poor Dentition   Pulmonary shortness of breath and with exertion, neg sleep apnea, pneumonia, resolved, neg COPD, Recent URI  (Had COVID and is still SOB), Patient abstained from smoking., former smoker,  S/p LULobectomy    + decreased breath sounds      Cardiovascular Exercise Tolerance: Good hypertension, On Home Beta Blockers and On Medications (-) angina+ CAD  (-) Past MI, (-) Cardiac Stents and (-) CABG Normal cardiovascular exam+ dysrhythmias Atrial Fibrillation (-) Valvular Problems/Murmurs     Neuro/Psych negative neurological ROS  negative psych ROS   GI/Hepatic negative GI ROS, Neg liver ROS,   Endo/Other  diabetes, Type 2  Renal/GU Renal disease  negative genitourinary   Musculoskeletal   Abdominal   Peds  Hematology negative hematology ROS (+)   Anesthesia Other Findings Past Medical History:   Full dentures                                                  Comment:upper and lower   Wears glasses                                                Enlarged prostate                                            Hypomagnesemia                                               H/O hypokalemia                                              Diverticulitis                                                 Comment:DIVERTICULOSIS   Hyperlipemia                                                 Paroxysmal atrial flutter (HCC)                                Comment:a. 03/2013->no recurrence;  b. CHA2DS2VASc =  2-->not currently on anticoagulation;  c.                05/2012 Echo: EF 55-60%, normal RV.   Allergic rhinitis                                            IFG (impaired fasting glucose)                               Monocytosis                                                  Benign prostatic hypertrophy with lower urinar*              Chest pain                                                     Comment:a. 05/2013 MV: EF 70%, no ischemia.   Lung cancer ([place])                                              Comment:a. carcinoid, left lung, Stage 1b (T2a, N0,               cM0);  b. 05/2013 s/p VATS & LULobectomy.   Essential hypertension                                         Comment:CONTROLLED ON MEDS   Hx of atrial fibrillation without current medi*                Comment:CARDIOLOGIST-DR GOLLAN/ [facility]   Reproductive/Obstetrics negative OB ROS                             Anesthesia Physical  Anesthesia Plan  ASA: III  Anesthesia Plan: General   Post-op Pain Management:    Induction: Intravenous  PONV Risk Score and Plan: 2 and Ondansetron, Dexamethasone and Treatment may vary due to age or medical condition  Airway Management Planned: Oral ETT  Additional Equipment: None  Intra-op Plan:   Post-operative Plan: Extubation in OR and Possible Post-op intubation/ventilation  Informed Consent: I have reviewed the patients History and Physical, chart, labs and discussed the procedure including the risks, benefits and alternatives for the proposed anesthesia with the patient or authorized representative who has indicated his/her understanding and acceptance.     Dental Advisory Given  Plan Discussed with: Anesthesiologist, CRNA and Surgeon  Anesthesia Plan Comments: (Patient consented for risks of anesthesia including but not limited to:  - adverse reactions to medications - damage to eyes, teeth, lips or other oral mucosa - nerve damage due to positioning  - sore throat or hoarseness - Damage to heart,  brain, nerves, lungs, other parts  of body or loss of life  Patient voiced understanding.)        Anesthesia Quick Evaluation

## 2019-11-26 NOTE — Anesthesia Procedure Notes (Signed)
Procedure Name: Intubation Date/Time: 11/26/2019 1:15 PM Performed by: Johnna Acosta, CRNA Pre-anesthesia Checklist: Patient identified, Emergency Drugs available, Suction available, Patient being monitored and Timeout performed Patient Re-evaluated:Patient Re-evaluated prior to induction Oxygen Delivery Method: Circle system utilized Preoxygenation: Pre-oxygenation with 100% oxygen Induction Type: IV induction Ventilation: Mask ventilation without difficulty and Oral airway inserted - appropriate to patient size Laryngoscope Size: McGraph and 3 Grade View: Grade I Tube type: Oral Tube size: 9.0 mm Number of attempts: 1 Airway Equipment and Method: Stylet,  Rigid stylet and Oral airway Placement Confirmation: ETT inserted through vocal cords under direct vision,  positive ETCO2 and breath sounds checked- equal and bilateral Secured at: 19 cm Tube secured with: Tape Dental Injury: Teeth and Oropharynx as per pre-operative assessment

## 2019-11-26 NOTE — Discharge Instructions (Signed)

## 2019-11-26 NOTE — Interval H&P Note (Signed)
History and Physical Interval Note:  11/26/2019 12:51 PM  Jason Malone  has presented today for surgery, with the diagnosis of CARCINOID TUMOR.  The various methods of treatment have been discussed with the patient and family. After consideration of risks, benefits and other options for treatment, the patient has consented to  Procedure(s): FLEXIBLE BRONCHOSCOPY WITH CRYO THERAPY (Left) as a surgical intervention.  The patient's history has been reviewed, patient examined, no change in status, stable for surgery.  I have reviewed the patient's chart and labs.  Questions were answered to the patient's satisfaction.     [name]

## 2019-11-26 NOTE — Op Note (Signed)
PROCEDURE: Bronchoscopy with Therapeutic Spray Cryotherapy Tumor Ablation  PROCEDURE DATE: 11/26/2019  TIME: 1300 hrs. NAME:  Jason Malone  DOB:[birth year]  MRN: [id number] LOC:  ARPO/None    HOSP DAY: SDS     Indications/Preliminary Diagnosis: Recurrent carcinoid tumor of the left mainstem bronchus  Consent: (Place X beside choice/s below)  The benefits, risks and possible complications of the procedure were        explained to:  _X__ patient  ___ patient's family  ___ other:___________  who verbalized understanding and gave:  ___ verbal  ___ written  _X__ verbal and written  ___ telephone  ___ other:________ consent.      Unable to obtain consent; procedure performed on emergent basis.     Other:    Benefits, limitations and potential complications of the procedure were discussed with the patient and wife including, but not limited to bleeding, hemoptysis, respiratory failure requiring intubation and/or prolongued mechanical ventilation, infection, pneumothorax (collapse of lung) requiring chest tube placement, stroke from air embolism or even death.   PRESEDATION ASSESSMENT: History and Physical has been performed. Patient meds and allergies have been reviewed. Preanesthesia airway examination has been performed and documented by anesthesiologist/CRNA. Baseline vital signs, oxygenation status, and cardiac rhythm were reviewed. Patient was deemed to be in satisfactory condition to undergo the procedure.   Operator: Renold Don, MD  Assistant/Scrub: Liborio Nixon, RRT  Circulator: Annia Belt, RRT  Anesthesiologist/Nurse Anesthetist: Katy Fitch, MD/Stephanie Michelet, CRNA  ANESTHESIA: GA   PROCEDURE DETAILS: Patient was brought to Procedure Room 2 (Bronchoscopy Suite) where appropriate timeout was performed and correct patient, name, ID and laterality were confirmed. The patient was inducted under general anesthesia and intubated with a #9 ET tube by the anesthesia  team. A Portex adapter was placed on the ETT flange. After adequate general anesthesia was achieved, the Olympus video therapeutic bronchoscope was then advanced through the Portex adapter into the airway. The visible portions of the trachea were normal. Carina was sharp. The right tracheobronchial tree was normal with only some scant mucoid secretions scattered throughout easily lavaged and suctioned. No evidence of purulence. At this point the bronchoscope was brought to the left and the previously treated area on the orifice of the left lower lobe bronchus had some residual mucosal abnormality but no frank mass. Previously a large mass had been resected from this area. The prior lobectomy sutures/staple were intact. At this point the mucosal irregularity noted on the previously resected area was treated with TruFreeze spray cryotherapy. The patient underwent 4 cycles of 15 seconds with 5-second freeze. 4 cycles were needed as the first cycle did not freeze completely. Had passive venting during the spray sessions and the circulator had hands-on chest to enter for potential chest expansion during therapy. Passive venting including because of the ventilation cycle, deflation of the ET tube balloon and disconnection of the respiratory circuit during the 15-second spray. The mucosa looked very pink and viable post freezing. No bleeding noted. No specimens were collected as these had been previously obtained confirming recurrent carcinoid tumor. This procedure was to complete cryoablation to the previously resected carcinoid tumor of the left mainstem bronchus. After the procedure was concluded the patient received 1% lidocaine anesthesia a total of 9 mL via bronchial lavage into the airway. The bronchoscope was retrieved and the procedure was terminated. The patient was allowed to emerge from general anesthesia and extubated successfully in the Procedure Room and transferred to the PACU in satisfactory condition.  Patient tolerated  the procedure well, no immediate complications noted.   Insertion Route (Place X beside choice below)   Nasal   Oral  X Endotracheal Tube   Tracheostomy   INTRAPROCEDURE MEDICATIONS:   Medication Amt Dose  Medication Amt Dose  Lidocaine 1% 9 ml  Epinephrine 1:10,000 sol  cc  Xylocaine 4%  cc  Cocaine  cc   TECHNICAL PROCEDURES: (Place X beside choice below)   Procedures  Description    None     Electrocautery   X  Cryotherapy  spray cryotherapy    Balloon Dilatation     Bronchography     Stent Placement     Therapeutic Aspiration     Laser/Argon Plasma    Brachytherapy Catheter Placement    Foreign Body Removal      SPECIMENS (Sites): (Place X beside choice below)  Specimens Description  X No Specimens Obtained     Washings    Lavage    Biopsies    Fine Needle Aspirates    Brushings    Sputum    FINDINGS: Healing mucosa at the site of prior recurrent carcinoid tumor resection and spray cryotherapy for tumor ablation.  ESTIMATED BLOOD LOSS: none   COMPLICATIONS/RESOLUTION: none   IMPRESSION:POST-PROCEDURE DX:   Recurrent carcinoid tumor of the left lung  Healing area of prior endobronchial tumor resection  RECOMMENDATION/PLAN:   Continue to monitor patient clinically  Periodic CT chest as per oncology recommendations and as needed for recurrent symptoms of airway obstruction   C. Derrill Kay, MD [facility]   *This note was dictated using voice recognition software/Dragon.  Despite best efforts to proofread, errors can occur which can change the meaning.  Any change was purely unintentional.

## 2019-11-26 NOTE — Transfer of Care (Signed)
Immediate Anesthesia Transfer of Care Note  Patient: Jason Malone  Procedure(s) Performed: FLEXIBLE BRONCHOSCOPY WITH CRYO THERAPY (Left )  Patient Location: PACU  Anesthesia Type:General  Level of Consciousness: awake, alert  and oriented  Airway & Oxygen Therapy: Patient Spontanous Breathing and Patient connected to face mask oxygen  Post-op Assessment: Report given to RN and Post -op Vital signs reviewed and stable  Post vital signs: Reviewed and stable  Last Vitals:  Vitals Value Taken Time  BP 136/60 11/26/19 1357  Temp 36 C 11/26/19 1357  Pulse 63 11/26/19 1359  Resp 11 11/26/19 1359  SpO2 100 % 11/26/19 1359    Last Pain:  Vitals:   11/26/19 1357  TempSrc:   PainSc: 0-No pain         Complications: No complications documented.

## 2019-11-27 ENCOUNTER — Encounter: Payer: Self-pay | Admitting: Pulmonary Disease

## 2019-11-27 NOTE — Anesthesia Postprocedure Evaluation (Signed)
Anesthesia Post Note  Patient: Jason Malone  Procedure(s) Performed: FLEXIBLE BRONCHOSCOPY WITH CRYO THERAPY (Left )  Patient location during evaluation: PACU Anesthesia Type: General Level of consciousness: awake and alert Pain management: pain level controlled Vital Signs Assessment: post-procedure vital signs reviewed and stable Respiratory status: spontaneous breathing, nonlabored ventilation, respiratory function stable and patient connected to nasal cannula oxygen Cardiovascular status: blood pressure returned to baseline and stable Postop Assessment: no apparent nausea or vomiting Anesthetic complications: no   No complications documented.   Last Vitals:  Vitals:   11/26/19 1425 11/26/19 1434  BP: (!) 122/55 (!) 138/56  Pulse: 64 63  Resp: 15 18  Temp: (!) 36 C (!) 36.2 C  SpO2: 94% 96%    Last Pain:  Vitals:   11/26/19 1434  TempSrc: Temporal  PainSc: 0-No pain                 Precious Haws Mahlon Gabrielle

## 2019-12-01 NOTE — Progress Notes (Signed)
[place]  [phone] [phone]) [phone]  ID: Jason Malone OB: [birth year]  MR#: [id number]  EHU#:[id number]  Patient Care Team: Towanda Malkin, MD as PCP - General (Internal Medicine) Minna Merritts, MD as PCP - Cardiology (Cardiology) [name], Forest Gleason, MD (General Surgery) Nestor Lewandowsky, MD as Referring Physician (Cardiothoracic Surgery) Minna Merritts, MD as Consulting Physician (Cardiology) [name], MD as Consulting Physician (Cardiothoracic Surgery) Lloyd Huger, MD as Consulting Physician (Oncology) Sanda Klein Satira Anis, MD as Attending Physician (Family Medicine)   CHIEF COMPLAINT: 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  INTERVAL HISTORY: Patient returns to clinic today for further evaluation and continuation of lanreotide.  He recently underwent his third bronchoscopy and ablation and tolerated the procedure well.  He currently feels well and is asymptomatic.  He does not complain of weakness or fatigue today.  He has no neurologic complaints.  He has a good appetite and denies weight loss.  He denies any chest pain, shortness of breath, cough, or hemoptysis.  He denies any abdominal pain.  He has no nausea, vomiting, constipation, or diarrhea.  He has no urinary complaints.  Patient offers no specific complaints today.  REVIEW OF SYSTEMS:   Review of Systems  Constitutional: Negative.  Negative for fever, malaise/fatigue and weight loss.  Respiratory: Negative.  Negative for cough, hemoptysis and shortness of breath.   Cardiovascular: Negative.  Negative for chest pain and leg swelling.  Gastrointestinal: Negative.  Negative for abdominal pain, diarrhea, nausea and vomiting.  Genitourinary: Negative.  Negative for dysuria.  Musculoskeletal: Negative.  Negative for back pain.  Skin: Negative.  Negative for rash.  Neurological: Negative.  Negative for dizziness, sensory change, focal  weakness and weakness.  Psychiatric/Behavioral: Negative.  The patient is not nervous/anxious.     As per HPI. Otherwise, a complete review of systems is negative.  PAST MEDICAL HISTORY: Past Medical History:  Diagnosis Date   Allergic rhinitis    Benign prostatic hypertrophy with lower urinary tract symptoms (LUTS)    Chest pain    a. 05/2013 MV: EF 70%, no ischemia.   Diverticulitis    DIVERTICULOSIS   Enlarged prostate    Essential hypertension    CONTROLLED ON MEDS   Full dentures    upper and lower   H/O hypokalemia    Hx of atrial fibrillation without current medication    CARDIOLOGIST-DR GOLLAN/ [facility]   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 08/28/2016   Hyperlipemia    Hypomagnesemia    IFG (impaired fasting glucose)    Liver cancer (HCC)    Liver cancer ([place]) 12/08/2016   Liver mass, left lobe 08/28/2016   Probably hemangioma; will get MRI of liver, refer to GI   Lung cancer ([place])    a. carcinoid, left lung, Stage 1b (T2a, N0, cM0);  b. 05/2013 s/p VATS & LULobectomy.   Monocytosis    Osteopenia    Paroxysmal atrial flutter (HCC)    a. 03/2013->no recurrence;  b. CHA2DS2VASc = 2-->not currently on anticoagulation;  c. 05/2012 Echo: EF 55-60%, normal RV.   Wears glasses     PAST SURGICAL HISTORY: Past Surgical History:  Procedure Laterality Date   BRONCHOSCOPY     04/06/2013   CARDIOVASCULAR STRESS TEST  05/2013   a. No evidence of ischemia or infarct, EF 70%, no WMAs   CATARACT EXTRACTION W/PHACO Left 07/14/2015   Procedure: CATARACT EXTRACTION PHACO AND INTRAOCULAR LENS PLACEMENT (  [place]);  Surgeon: [name], MD;  Location: [place];  Service: Ophthalmology;  Laterality: Left;   CATARACT EXTRACTION W/PHACO Right 10/01/2015   Procedure: CATARACT EXTRACTION PHACO AND INTRAOCULAR LENS PLACEMENT ([facility]);  Surgeon: [name], MD;  Location: [place];  Service: Ophthalmology;  Laterality: Right;     COLONOSCOPY W/ POLYPECTOMY     bleed after-had to go to surgery to stop bleeding via colonoscopy   COLONOSCOPY WITH PROPOFOL N/A 11/19/2015   Procedure: COLONOSCOPY WITH PROPOFOL;  Surgeon: [name], MD;  Location: [facility];  Service: Endoscopy;  Laterality: N/A;   DUPUYTREN CONTRACTURE RELEASE  12/15/2011   Procedure: DUPUYTREN CONTRACTURE RELEASE;  Surgeon: [name], MD;  Location: [place];  Service: Orthopedics;  Laterality: Left;  Fasciotomy left ring finger dupuytrens   ELECTROMAGNETIC NAVIGATION BROCHOSCOPY Left 09/19/2019   Procedure: ELECTROMAGNETIC NAVIGATION BRONCHOSCOPY;  Surgeon: Tyler Pita, MD;  Location: [facility];  Service: Cardiopulmonary;  Laterality: Left;   FASCIECTOMY Right 11/07/2018   Procedure: SEGMENTAL FASCIECTOMY RIGHT RING FINGER;  Surgeon: Daryll Brod, MD;  Location: [place];  Service: Orthopedics;  Laterality: Right;  AXILLARY BLOCK   FLEXIBLE BRONCHOSCOPY Left 11/26/2019   Procedure: FLEXIBLE BRONCHOSCOPY WITH CRYO THERAPY;  Surgeon: Tyler Pita, MD;  Location: [facility];  Service: Cardiopulmonary;  Laterality: Left;   IR ANGIOGRAM SELECTIVE EACH ADDITIONAL VESSEL  07/26/2017   IR ANGIOGRAM SELECTIVE EACH ADDITIONAL VESSEL  07/26/2017   IR ANGIOGRAM SELECTIVE EACH ADDITIONAL VESSEL  07/26/2017   IR ANGIOGRAM SELECTIVE EACH ADDITIONAL VESSEL  07/26/2017   IR ANGIOGRAM SELECTIVE EACH ADDITIONAL VESSEL  07/26/2017   IR ANGIOGRAM SELECTIVE EACH ADDITIONAL VESSEL  08/11/2017   IR ANGIOGRAM SELECTIVE EACH ADDITIONAL VESSEL  04/28/2018   IR ANGIOGRAM VISCERAL SELECTIVE  07/26/2017   IR ANGIOGRAM VISCERAL SELECTIVE  08/11/2017   IR ANGIOGRAM VISCERAL SELECTIVE  04/28/2018   IR EMBO ARTERIAL NOT HEMORR HEMANG INC GUIDE ROADMAPPING  07/26/2017   IR EMBO TUMOR ORGAN ISCHEMIA INFARCT INC GUIDE ROADMAPPING  08/11/2017   IR EMBO TUMOR ORGAN ISCHEMIA INFARCT INC GUIDE ROADMAPPING  04/28/2018   IR  RADIOLOGIST EVAL & MGMT  07/07/2017   IR RADIOLOGIST EVAL & MGMT  09/07/2017   IR RADIOLOGIST EVAL & MGMT  12/21/2017   IR RADIOLOGIST EVAL & MGMT  03/22/2018   IR RADIOLOGIST EVAL & MGMT  06/08/2018   IR RADIOLOGIST EVAL & MGMT  11/01/2018   IR RADIOLOGIST EVAL & MGMT  03/21/2019   IR RADIOLOGIST EVAL & MGMT  11/08/2019   IR US GUIDE VASC ACCESS RIGHT  07/26/2017   IR US GUIDE VASC ACCESS RIGHT  08/11/2017   IR US GUIDE VASC ACCESS RIGHT  04/28/2018   LUNG LOBECTOMY  06/10/13   upper left lung   MULTIPLE TOOTH EXTRACTIONS     REMOVAL RETAINED LENS Right 11/17/2015   Procedure: REMOVAL RETAINED LENS FROAGMENTS RIGHT EYE;  Surgeon: [name], MD;  Location: [place];  Service: Ophthalmology;  Laterality: Right;   TONSILLECTOMY     UPPER GASTROINTESTINAL ENDOSCOPY  11-03-15   Dr [name]   VASECTOMY     VIDEO ASSISTED THORACOSCOPY (VATS)/WEDGE RESECTION Left 06/04/2013   Procedure: VIDEO ASSISTED THORACOSCOPY (VATS)/WEDGE RESECTION;  Surgeon: [name], MD;  Location: [place];  Service: Thoracic;  Laterality: Left;   VIDEO BRONCHOSCOPY N/A 06/04/2013   Procedure: VIDEO BRONCHOSCOPY;  Surgeon: [name], MD;  Location: [place];  Service: Thoracic;  Laterality: N/A;  FAMILY HISTORY: Family History  Problem Relation Age of Onset   Stroke Mother    Alzheimer's disease Mother    Hypertension Sister    Hyperlipidemia Sister    Hypertension Brother    Hyperlipidemia Brother    Diabetes Brother     ADVANCED DIRECTIVES (Y/N):  N  HEALTH MAINTENANCE: Social History   Tobacco Use   Smoking status: Former Smoker    Packs/day: 1.00    Years: 40.00    Pack years: 40.00    Types: Cigarettes    Quit date: 12/09/1998    Years since quitting: 21.0   Smokeless tobacco: Never Used  Scientific laboratory technician Use: Never used  Substance Use Topics   Alcohol use: No   Drug use: No     Colonoscopy:  PAP:  Bone density:  Lipid  panel:  Allergies  Allergen Reactions   Tape Itching    Surgical tapes     Current Outpatient Medications  Medication Sig Dispense Refill   aspirin EC 81 MG tablet Take 81 mg by mouth daily.      atorvastatin (LIPITOR) 40 MG tablet TAKE 1 TABLET BY MOUTH ONCE DAILY FOR CHOLESTEROL (Patient taking differently: Take 40 mg by mouth daily. TAKE 1 TABLET BY MOUTH ONCE DAILY FOR CHOLESTEROL) 90 tablet 1   b complex vitamins capsule Take 1 capsule by mouth daily.     cholecalciferol (VITAMIN D3) 25 MCG (1000 UNIT) tablet Take 1,000 Units by mouth daily.     Cinnamon 500 MG capsule Take 500 mg by mouth at bedtime.      diphenhydrAMINE (BENADRYL) 25 mg capsule Take 25 mg by mouth every morning.      DM-APAP-CPM (CORICIDIN HBP PO) Take 2 tablets by mouth daily as needed (allergies/cold symptoms).     finasteride (PROSCAR) 5 MG tablet Take 1 tablet (5 mg total) by mouth daily. 90 tablet 1   fluticasone furoate-vilanterol (BREO ELLIPTA) 100-25 MCG/INH AEPB Inhale 1 puff into the lungs daily. 28 each 0   Glucose Blood (BLOOD GLUCOSE TEST STRIPS) STRP Use as directed to monitor FSBS once daily. Dx: E11.9. 50 strip 1   losartan (COZAAR) 50 MG tablet Take 1.5 tablets (75 mg total) by mouth daily. 45 tablet 2   Multiple Vitamins-Minerals (MULTIVITAMIN WITH MINERALS) tablet Take 1 tablet by mouth daily.      polycarbophil (FIBERCON) 625 MG tablet Take 625 mg by mouth 2 (two) times daily.      tamsulosin (FLOMAX) 0.4 MG CAPS capsule Take 2 capsules (0.8 mg total) by mouth at bedtime. 180 capsule 3   vitamin C (ASCORBIC ACID) 500 MG tablet Take 500 mg by mouth daily.      zinc sulfate 220 (50 Zn) MG capsule Take 1 capsule (220 mg total) by mouth daily. 30 capsule 0   No current facility-administered medications for this visit.   Facility-Administered Medications Ordered in Other Visits  Medication Dose Route Frequency Provider Last Rate Last Admin   lanreotide acetate (SOMATULINE DEPOT)  injection 120 mg  120 mg Subcutaneous Once Lloyd Huger, MD        OBJECTIVE: Vitals:   12/04/19 1101  BP: (!) 140/72  Pulse: 57  Resp: 16  Temp: (!) 96.8 F (36 C)  SpO2: 99%     Body mass index is 26.57 kg/m.    ECOG FS:1 - Symptomatic but completely ambulatory  General: Well-developed, well-nourished, no acute distress. Eyes: Pink conjunctiva, anicteric sclera. HEENT: Normocephalic, moist mucous membranes. Lungs:  No audible wheezing or coughing. Heart: Regular rate and rhythm. Abdomen: Soft, nontender, no obvious distention. Musculoskeletal: No edema, cyanosis, or clubbing. Neuro: Alert, answering all questions appropriately. Cranial nerves grossly intact. Skin: No rashes or petechiae noted. Psych: Normal affect.  LAB RESULTS:  Lab Results  Component Value Date   NA 138 12/04/2019   K 4.5 12/04/2019   CL 103 12/04/2019   CO2 25 12/04/2019   GLUCOSE 163 (H) 12/04/2019   BUN 23 12/04/2019   CREATININE 1.02 12/04/2019   CALCIUM 9.3 12/04/2019   PROT 6.8 12/04/2019   ALBUMIN 3.9 12/04/2019   AST 31 12/04/2019   ALT 49 (H) 12/04/2019   ALKPHOS 149 (H) 12/04/2019   BILITOT 0.9 12/04/2019   GFRNONAA >60 12/04/2019   GFRAA >60 12/04/2019    Lab Results  Component Value Date   WBC 11.1 (H) 12/04/2019   NEUTROABS 8.0 (H) 12/04/2019   HGB 14.9 12/04/2019   HCT 44.8 12/04/2019   MCV 88.4 12/04/2019   PLT 175 12/04/2019     STUDIES: IR Radiologist Eval & Mgmt  Result Date: 11/08/2019 Please refer to notes tab for details about interventional procedure. 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  PLAN:    1. 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 Patient underwent left upper lobe lobectomy in [place] on June 04, 2013. Final pathology results revealed a well differentiated neuroendocrine tumor  with positive bronchial margins. Patient did not receive adjuvant XRT or chemotherapy at that time.  Patient's most recent abdominal MRI on March 15, 2019 reviewed independently with stable to decrease in size of known lesions in patient's liver.  His most recent Y 90 ablation occurred on April 28, 2018.  Patient's chromogranin a levels are elevated, but relatively stable ranging from 213-261.  Proceed with 120 mg subcutaneous lanreotide today.  Return to clinic in 4 weeks for further evaluation and continuation of treatment.  We will repeat abdominal MRI prior to next clinic visit. 2.  Left mainstem endobronchial mass: Resolved.  Consistent with neuroendocrine tumor.  Patient underwent his third and final bronchoscopy with therapeutic cryoablation ablation on November 26, 2019.  Follow-up with pulmonology as indicated.   3.  Covid: Resolved.  4.  Urinary retention: Foley has been removed.    I spent a total of 30 minutes reviewing chart data, face-to-face evaluation with the patient, counseling and coordination of care as detailed above.    Patient expressed understanding and was in agreement with this plan. He also understands that He can call clinic at any time with any questions, concerns, or complaints.   Cancer Staging Neuroendocrine carcinoma of lung [facility]) Staging form: Lung, AJCC 8th Edition - Clinical stage from 12/28/2016: Stage IVA (cT2a, cN0, cM1b) - Signed by Lloyd Huger, MD on 12/28/2016   Lloyd Huger, MD   12/06/2019 5:49 AM

## 2019-12-04 ENCOUNTER — Encounter: Payer: Self-pay | Admitting: Oncology

## 2019-12-04 ENCOUNTER — Inpatient Hospital Stay: Payer: Medicare Other | Attending: Oncology

## 2019-12-04 ENCOUNTER — Inpatient Hospital Stay (HOSPITAL_BASED_OUTPATIENT_CLINIC_OR_DEPARTMENT_OTHER): Payer: Medicare Other | Admitting: Oncology

## 2019-12-04 ENCOUNTER — Other Ambulatory Visit: Payer: Self-pay

## 2019-12-04 ENCOUNTER — Inpatient Hospital Stay: Payer: Medicare Other

## 2019-12-04 VITALS — BP 140/72 | HR 57 | Temp 96.8°F | Resp 16 | Wt 154.8 lb

## 2019-12-04 DIAGNOSIS — N401 Enlarged prostate with lower urinary tract symptoms: Secondary | ICD-10-CM | POA: Diagnosis not present

## 2019-12-04 DIAGNOSIS — C7A8 Other malignant neuroendocrine tumors: Secondary | ICD-10-CM

## 2019-12-04 DIAGNOSIS — C7B8 Other secondary neuroendocrine tumors: Secondary | ICD-10-CM | POA: Diagnosis not present

## 2019-12-04 DIAGNOSIS — R338 Other retention of urine: Secondary | ICD-10-CM | POA: Insufficient documentation

## 2019-12-04 DIAGNOSIS — Z79899 Other long term (current) drug therapy: Secondary | ICD-10-CM | POA: Insufficient documentation

## 2019-12-04 DIAGNOSIS — C787 Secondary malignant neoplasm of liver and intrahepatic bile duct: Secondary | ICD-10-CM | POA: Insufficient documentation

## 2019-12-04 LAB — COMPREHENSIVE METABOLIC PANEL
ALT: 49 U/L — ABNORMAL HIGH (ref 0–44)
AST: 31 U/L (ref 15–41)
Albumin: 3.9 g/dL (ref 3.5–5.0)
Alkaline Phosphatase: 149 U/L — ABNORMAL HIGH (ref 38–126)
Anion gap: 10 (ref 5–15)
BUN: 23 mg/dL (ref 8–23)
CO2: 25 mmol/L (ref 22–32)
Calcium: 9.3 mg/dL (ref 8.9–10.3)
Chloride: 103 mmol/L (ref 98–111)
Creatinine, Ser: 1.02 mg/dL (ref 0.61–1.24)
GFR calc Af Amer: >60 mL/min (ref >60)
GFR calc non Af Amer: >60 mL/min (ref >60)
Glucose, Bld: 163 mg/dL — ABNORMAL HIGH (ref 70–99)
Potassium: 4.5 mmol/L (ref 3.5–5.1)
Sodium: 138 mmol/L (ref 135–145)
Total Bilirubin: 0.9 mg/dL (ref 0.3–1.2)
Total Protein: 6.8 g/dL (ref 6.5–8.1)

## 2019-12-04 LAB — CBC WITH DIFFERENTIAL/PLATELET
Abs Immature Granulocytes: 0.05 10*3/uL (ref 0.00–0.07)
Basophils Absolute: 0.1 10*3/uL (ref 0.0–0.1)
Basophils Relative: 1 %
Eosinophils Absolute: 0.2 10*3/uL (ref 0.0–0.5)
Eosinophils Relative: 1 %
HCT: 44.8 % (ref 39.0–52.0)
Hemoglobin: 14.9 g/dL (ref 13.0–17.0)
Immature Granulocytes: 1 %
Lymphocytes Relative: 9 %
Lymphs Abs: 1 10*3/uL (ref 0.7–4.0)
MCH: 29.4 pg (ref 26.0–34.0)
MCHC: 33.3 g/dL (ref 30.0–36.0)
MCV: 88.4 fL (ref 80.0–100.0)
Monocytes Absolute: 1.8 10*3/uL — ABNORMAL HIGH (ref 0.1–1.0)
Monocytes Relative: 16 %
Neutro Abs: 8 10*3/uL — ABNORMAL HIGH (ref 1.7–7.7)
Neutrophils Relative %: 72 %
Platelets: 175 10*3/uL (ref 150–400)
RBC: 5.07 MIL/uL (ref 4.22–5.81)
RDW: 14.8 % (ref 11.5–15.5)
WBC: 11.1 10*3/uL — ABNORMAL HIGH (ref 4.0–10.5)
nRBC: 0 % (ref 0.0–0.2)

## 2019-12-04 MED ORDER — LANREOTIDE ACETATE 120 MG/0.5ML ~~LOC~~ SOLN
120.0000 mg | Freq: Once | SUBCUTANEOUS | Status: AC
  Administered 2019-12-04: 120 mg via SUBCUTANEOUS
  Filled 2019-12-04: qty 120

## 2019-12-05 LAB — CHROMOGRANIN A: Chromogranin A (ng/mL): 265 ng/mL — ABNORMAL HIGH (ref 0.0–101.8)

## 2019-12-11 ENCOUNTER — Other Ambulatory Visit: Payer: Self-pay

## 2019-12-11 ENCOUNTER — Ambulatory Visit (INDEPENDENT_AMBULATORY_CARE_PROVIDER_SITE_OTHER): Payer: Medicare Other

## 2019-12-11 DIAGNOSIS — R0600 Dyspnea, unspecified: Secondary | ICD-10-CM

## 2019-12-11 LAB — ECHOCARDIOGRAM COMPLETE
AR max vel: 2.82 cm2
AV Area VTI: 2.86 cm2
AV Area mean vel: 2.72 cm2
AV Mean grad: 3 mmHg
AV Peak grad: 4.8 mmHg
Ao pk vel: 1.09 m/s
Area-P 1/2: 4.21 cm2
Calc EF: 54.8 %
S' Lateral: 3.3 cm
Single Plane A2C EF: 54.4 %
Single Plane A4C EF: 54.9 %

## 2019-12-12 ENCOUNTER — Telehealth: Payer: Self-pay

## 2019-12-12 NOTE — Telephone Encounter (Signed)
Called to give the patient echo results. [email or web address].

## 2019-12-12 NOTE — Telephone Encounter (Signed)
-----   Message from Loel Dubonnet, NP sent at 12/11/2019  8:34 PM EDT ----- Echocardiogram shows normal pumping function. Heart is moderately stiff - likely due to his history of HTN. No significant valvular abnormalities. Left atrium was mild to moderately dilated.

## 2019-12-12 NOTE — Telephone Encounter (Signed)
Patient made aware of echo results with verbalized understanding. 

## 2019-12-17 ENCOUNTER — Other Ambulatory Visit: Payer: Self-pay

## 2019-12-17 ENCOUNTER — Ambulatory Visit: Payer: Medicare Other | Admitting: Pulmonary Disease

## 2019-12-17 ENCOUNTER — Encounter: Payer: Self-pay | Admitting: Pulmonary Disease

## 2019-12-17 VITALS — BP 124/60 | HR 53 | Temp 97.5°F | Ht 64.0 in | Wt 152.0 lb

## 2019-12-17 DIAGNOSIS — C7A8 Other malignant neuroendocrine tumors: Secondary | ICD-10-CM

## 2019-12-17 DIAGNOSIS — J449 Chronic obstructive pulmonary disease, unspecified: Secondary | ICD-10-CM

## 2019-12-17 DIAGNOSIS — Z8616 Personal history of COVID-19: Secondary | ICD-10-CM

## 2019-12-17 DIAGNOSIS — J8410 Pulmonary fibrosis, unspecified: Secondary | ICD-10-CM

## 2019-12-17 MED ORDER — BREO ELLIPTA 100-25 MCG/INH IN AEPB: 1.0000 | INHALATION_SPRAY | Freq: Every day | RESPIRATORY_TRACT | 11 refills | Status: DC

## 2019-12-17 MED ORDER — BREO ELLIPTA 100-25 MCG/INH IN AEPB: 1.0000 | INHALATION_SPRAY | Freq: Every day | RESPIRATORY_TRACT | 0 refills | Status: AC

## 2019-12-17 NOTE — Patient Instructions (Signed)
We will see you n follow-up in 3 months time call sooner should any new problems arise.   Prescription for Adair Patter was sent to your pharmacy.

## 2019-12-17 NOTE — Progress Notes (Signed)
 Assessment & Plan:  1. Neuroendocrine carcinoma of lung (HCC) (Primary)  2. COPD mixed type (HCC)  3. Postinflammatory pulmonary fibrosis (HCC)  4. Personal history of covid-19   Patient Instructions  We will see you n follow-up in 3 months time call sooner should any new problems arise.   Prescription for Breo Ellipta  was sent to your pharmacy.  Please note: late entry documentation due to logistical difficulties during COVID-19 pandemic. This note is filed for information purposes only, and is not intended to be used for billing, nor does it represent the full scope/nature of the visit in question. Please see any associated scanned media linked to date of encounter for additional pertinent information.  Subjective:    HPI: Jason Malone is a 79 y.o. male presenting to the pulmonology clinic on 12/17/2019 with report of: Follow-up (c/o sob with exertion and non prod cough. )     Outpatient Encounter Medications as of 12/17/2019  Medication Sig Note   aspirin  EC 81 MG tablet Take 81 mg by mouth daily.    b complex vitamins capsule Take 1 capsule by mouth daily.    cholecalciferol (VITAMIN D3) 25 MCG (1000 UNIT) tablet Take 1,000 Units by mouth daily.    Cinnamon 500 MG capsule Take 500 mg by mouth at bedtime.    DM-APAP-CPM (CORICIDIN HBP PO) Take 2 tablets by mouth daily as needed (allergies/cold symptoms). (Patient not taking: Reported on 12/12/2023)    Multiple Vitamins-Minerals (MULTIVITAMIN WITH MINERALS) tablet Take 1 tablet by mouth daily.    polycarbophil (FIBERCON) 625 MG tablet Take 625 mg by mouth 2 (two) times daily.    vitamin C (ASCORBIC ACID ) 500 MG tablet Take 500 mg by mouth daily.    zinc  sulfate 220 (50 Zn) MG capsule Take 1 capsule (220 mg total) by mouth daily.    [DISCONTINUED] atorvastatin  (LIPITOR) 40 MG tablet TAKE 1 TABLET BY MOUTH ONCE DAILY FOR CHOLESTEROL (Patient taking differently: Take 40 mg by mouth daily. TAKE 1 TABLET BY MOUTH ONCE DAILY FOR  CHOLESTEROL)    [DISCONTINUED] diphenhydrAMINE  (BENADRYL ) 25 mg capsule Take 25 mg by mouth every morning. (Patient not taking: Reported on 08/02/2022)    [DISCONTINUED] finasteride  (PROSCAR ) 5 MG tablet Take 1 tablet (5 mg total) by mouth daily. (Patient not taking: No sig reported)    [DISCONTINUED] Glucose Blood (BLOOD GLUCOSE TEST STRIPS) STRP Use as directed to monitor FSBS once daily. Dx: E11.9.    [DISCONTINUED] losartan  (COZAAR ) 50 MG tablet Take 1.5 tablets (75 mg total) by mouth daily.    [DISCONTINUED] tamsulosin  (FLOMAX ) 0.4 MG CAPS capsule Take 2 capsules (0.8 mg total) by mouth at bedtime.    [EXPIRED] fluticasone  furoate-vilanterol (BREO ELLIPTA ) 100-25 MCG/INH AEPB Inhale 1 puff into the lungs daily for 1 day.    [DISCONTINUED] fluticasone  furoate-vilanterol (BREO ELLIPTA ) 100-25 MCG/INH AEPB Inhale 1 puff into the lungs daily. (Patient not taking: Reported on 12/17/2019) 11/07/2019: ON HOLD    [DISCONTINUED] fluticasone  furoate-vilanterol (BREO ELLIPTA ) 100-25 MCG/INH AEPB Inhale 1 puff into the lungs daily.    Facility-Administered Encounter Medications as of 12/17/2019  Medication   lanreotide acetate  (SOMATULINE DEPOT ) injection 120 mg      Objective:   Vitals:   12/17/19 1119  BP: 124/60  Pulse: (!) 53  Temp: (!) 97.5 F (36.4 C)  Height: 5' 4 (1.626 m)  Weight: 152 lb (68.9 kg)  SpO2: 100%  TempSrc: Temporal  BMI (Calculated): 26.08     Physical exam documentation  is limited by delayed entry of information.

## 2019-12-19 ENCOUNTER — Ambulatory Visit (INDEPENDENT_AMBULATORY_CARE_PROVIDER_SITE_OTHER): Payer: Medicare Other | Admitting: Family

## 2019-12-19 ENCOUNTER — Other Ambulatory Visit: Payer: Self-pay

## 2019-12-19 ENCOUNTER — Encounter: Payer: Self-pay | Admitting: Family

## 2019-12-19 VITALS — BP 150/78 | HR 54 | Ht 64.0 in | Wt 151.0 lb

## 2019-12-19 DIAGNOSIS — I700 Atherosclerosis of aorta: Secondary | ICD-10-CM | POA: Diagnosis not present

## 2019-12-19 DIAGNOSIS — I951 Orthostatic hypotension: Secondary | ICD-10-CM

## 2019-12-19 DIAGNOSIS — I4892 Unspecified atrial flutter: Secondary | ICD-10-CM

## 2019-12-19 DIAGNOSIS — I2510 Atherosclerotic heart disease of native coronary artery without angina pectoris: Secondary | ICD-10-CM | POA: Diagnosis not present

## 2019-12-19 DIAGNOSIS — I10 Essential (primary) hypertension: Secondary | ICD-10-CM

## 2019-12-19 DIAGNOSIS — R0600 Dyspnea, unspecified: Secondary | ICD-10-CM

## 2019-12-19 MED ORDER — LOSARTAN POTASSIUM 50 MG PO TABS: 75.0000 mg | ORAL_TABLET | Freq: Every day | ORAL | 1 refills | Status: DC

## 2019-12-19 NOTE — Progress Notes (Signed)
Office Visit    Patient Name: Jason Malone Date of Encounter: 12/19/2019  Primary Care Provider:  Towanda Malkin, MD Primary Cardiologist:  Ida Rogue, MD Electrophysiologist:  None   Chief Complaint    Jason Malone is a 79 y.o. 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  Past Medical History    Past Medical History:  Diagnosis Date  . Allergic rhinitis   . Benign prostatic hypertrophy with lower urinary tract symptoms (LUTS)   . Chest pain    a. 05/2013 MV: EF 70%, no ischemia.  . Diverticulitis    DIVERTICULOSIS  . Enlarged prostate   . Essential hypertension    CONTROLLED ON MEDS  . Full dentures    upper and lower  . H/O hypokalemia   . Hx of atrial fibrillation without current medication    CARDIOLOGIST-DR [facility]  . 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 Hyperlipemia   . Hypomagnesemia   . IFG (impaired fasting glucose)   . Liver cancer ([place])   . Liver cancer ([place]) 12/08/2016  . Liver mass, left lobe 08/28/2016   Probably hemangioma; will get MRI of liver, refer to GI  . Lung cancer ([place])    a. carcinoid, left lung, Stage 1b (T2a, N0, cM0);  b. 05/2013 s/p VATS & LULobectomy.  . Monocytosis   . Osteopenia   . Paroxysmal atrial flutter ([facility])    a. 03/2013->no recurrence;  b. CHA2DS2VASc = 2-->not currently on anticoagulation;  c. 05/2012 Echo: EF 55-60%, normal RV.  [name] Wears glasses    Past Surgical History:  Procedure Laterality Date  . BRONCHOSCOPY     04/06/2013  . CARDIOVASCULAR STRESS TEST  05/2013   a. No evidence of ischemia or infarct, EF 70%, no WMAs  . CATARACT EXTRACTION W/PHACO Left 07/14/2015   Procedure: CATARACT EXTRACTION PHACO AND INTRAOCULAR LENS PLACEMENT (IOC);  Surgeon:  [name], MD;  Location: [place];  Service: Ophthalmology;  Laterality: Left;  . CATARACT EXTRACTION W/PHACO Right 10/01/2015   Procedure: CATARACT EXTRACTION PHACO AND INTRAOCULAR LENS PLACEMENT (IOC);  Surgeon: [name], MD;  Location: [place];  Service: Ophthalmology;  Laterality: Right;  . COLONOSCOPY W/ POLYPECTOMY     bleed after-had to go to surgery to stop bleeding via colonoscopy  . COLONOSCOPY WITH PROPOFOL N/A 11/19/2015   Procedure: COLONOSCOPY WITH PROPOFOL;  Surgeon: [name], MD;  Location: [facility] ENDOSCOPY;  Service: Endoscopy;  Laterality: N/A;  . DUPUYTREN CONTRACTURE RELEASE  12/15/2011   Procedure: DUPUYTREN CONTRACTURE RELEASE;  Surgeon: [name], MD;  Location: [place];  Service: Orthopedics;  Laterality: Left;  Fasciotomy left ring finger dupuytrens  . ELECTROMAGNETIC NAVIGATION BROCHOSCOPY Left 09/19/2019   Procedure: ELECTROMAGNETIC NAVIGATION BRONCHOSCOPY;  Surgeon: Tyler Pita, MD;  Location: [facility];  Service: Cardiopulmonary;  Laterality: Left;  . FASCIECTOMY Right 11/07/2018   Procedure: SEGMENTAL FASCIECTOMY RIGHT RING FINGER;  Surgeon: Daryll Brod, MD;  Location: [place];  Service: Orthopedics;  Laterality: Right;  AXILLARY BLOCK  . FLEXIBLE BRONCHOSCOPY Left 11/26/2019   Procedure: FLEXIBLE BRONCHOSCOPY WITH CRYO THERAPY;  Surgeon: Tyler Pita, MD;  Location: [facility];  Service: Cardiopulmonary;  Laterality: Left;  . IR ANGIOGRAM SELECTIVE  EACH ADDITIONAL VESSEL  07/26/2017  . IR ANGIOGRAM SELECTIVE EACH ADDITIONAL VESSEL  07/26/2017  . IR ANGIOGRAM SELECTIVE EACH ADDITIONAL VESSEL  07/26/2017  . IR ANGIOGRAM SELECTIVE EACH ADDITIONAL VESSEL  07/26/2017  . IR ANGIOGRAM SELECTIVE EACH ADDITIONAL VESSEL  07/26/2017  . IR ANGIOGRAM SELECTIVE EACH ADDITIONAL VESSEL  08/11/2017  . IR ANGIOGRAM SELECTIVE EACH ADDITIONAL VESSEL  04/28/2018  . IR ANGIOGRAM VISCERAL SELECTIVE   07/26/2017  . IR ANGIOGRAM VISCERAL SELECTIVE  08/11/2017  . IR ANGIOGRAM VISCERAL SELECTIVE  04/28/2018  . IR EMBO ARTERIAL NOT HEMORR HEMANG INC GUIDE ROADMAPPING  07/26/2017  . IR EMBO TUMOR ORGAN ISCHEMIA INFARCT INC GUIDE ROADMAPPING  08/11/2017  . IR EMBO TUMOR ORGAN ISCHEMIA INFARCT INC GUIDE ROADMAPPING  04/28/2018  . IR RADIOLOGIST EVAL & MGMT  07/07/2017  . IR RADIOLOGIST EVAL & MGMT  09/07/2017  . IR RADIOLOGIST EVAL & MGMT  12/21/2017  . IR RADIOLOGIST EVAL & MGMT  03/22/2018  . IR RADIOLOGIST EVAL & MGMT  06/08/2018  . IR RADIOLOGIST EVAL & MGMT  11/01/2018  . IR RADIOLOGIST EVAL & MGMT  03/21/2019  . IR RADIOLOGIST EVAL & MGMT  11/08/2019  . IR US GUIDE VASC ACCESS RIGHT  07/26/2017  . IR US GUIDE VASC ACCESS RIGHT  08/11/2017  . IR US GUIDE VASC ACCESS RIGHT  04/28/2018  . LUNG LOBECTOMY  06/10/13   upper left lung  . MULTIPLE TOOTH EXTRACTIONS    . REMOVAL RETAINED LENS Right 11/17/2015   Procedure: REMOVAL RETAINED LENS FROAGMENTS RIGHT EYE;  Surgeon: [name], MD;  Location: [place];  Service: Ophthalmology;  Laterality: Right;  . TONSILLECTOMY    . UPPER GASTROINTESTINAL ENDOSCOPY  11-03-15   Dr [name]  . VASECTOMY    . VIDEO ASSISTED THORACOSCOPY (VATS)/WEDGE RESECTION Left 06/04/2013   Procedure: VIDEO ASSISTED THORACOSCOPY (VATS)/WEDGE RESECTION;  Surgeon: [name], MD;  Location: [place];  Service: Thoracic;  Laterality: Left;  [name] VIDEO BRONCHOSCOPY N/A 06/04/2013   Procedure: VIDEO BRONCHOSCOPY;  Surgeon: [name], MD;  Location: [facility] OR;  Service: Thoracic;  Laterality: N/A;    Allergies  Allergies  Allergen Reactions  . Tape Itching    Surgical tapes     History of Present Illness    Jason Malone is a 79 y.o. male with a hx of previous tobaco use, lung cancer on left s/p resection 05/2013 and stabe IVA neuroendocrine tumor of the lung metastatic ot hte liver, HTN, HLD, atrial flutter, orthostasis, COVID pneumonia 07/2019 last seen  11/06/19.  Atrial flutter was treated with rate controlling medications and he self converted. Initial diagnosis during hospitalization 03/2013.   HCTZ previously stopped due to hypokalemia and hypomagnesia.  Coreg previously held for orthostasis. Elevated HR 07/2019. Started on metoprolol tartrate 61m PRN for elevated heart rates. TElls me he has not had to take this medication.   He had COVID pneumonia 07/2019. He is presently following with Dr. F[facility]land [facility] oncology for endobronchial lesion which was determined to be neuroendocrine tumor of the lung with metastasis to liver. Presently on lanreotide. He had ablation 5/1/221 and 10/10/19. He has upcoming cyroablation with Dr. GPatsey Berthold7/19/21.   He was seen in clinic 11/06/19.  He reported DOE, lightheadedness, weakness in his legs.  He was walking 20 minutes/day on the treadmill without difficulty.  Longstanding nonproductive cough.  He reported near syncope but no true syncope.  Was worse with position changes.  Orthostatic vital signs were positive.  Blood pressure  was running low at home.  He was recommended for echocardiogram and heart monitor.  His losartan was reduced.  Echo 12/10/2019 with LVEF 55-60%, mild LVH, grade 2 diastolic dysfunction, RV normal size and function, LA mild to moderately dilated, mild MR.  His monitor has not been yet reviewed by MD preliminary report shows predominantly NSR with minimum HR 40, avg 63, and max 164 bpm. He had 27 runs of SVT with longest 15 beats with rate of 98 bpm and fastest 10 beats with rate 164 bpm. he had raree PVC <1% and rare PACs 1.3%. He had rare ventricular bigeminy and trigeminy. No significant pauses. There were no patient triggered events.  Since last seen he has undergone his third bronchoscopy and ablation and tolerated well.  He continues lanreotide at the direction of oncology.  He was seen by pulmonology 12/17/2019 and started on Breo.  Presents today for follow-up with his wife.  Reports  feeling overall well.  He has only had one episode of dizziness since last seen.  We reviewed his echocardiogram in detail.  We also reviewed preliminary report of his ZIO monitor.  They were reassured by the results. BP at home 120s-130s.  He has been taking 1.25 of his losartan 50 mg tablets.  Discussed BP goal <140/90.   EKGs/Labs/Other Studies Reviewed:   The following studies were reviewed today:  Echo 12/11/19  1. Left ventricular ejection fraction, by estimation, is 55 to 60%. The  left ventricle has normal function. The left ventricle has no regional  wall motion abnormalities. There is mild left ventricular hypertrophy.  Left ventricular diastolic parameters  are consistent with Grade II diastolic dysfunction (pseudonormalization).   2. Right ventricular systolic function is normal. The right ventricular  size is normal. Tricuspid regurgitation signal is inadequate for assessing  PA pressure.   3. Left atrial size was mild to moderately dilated.   4. The mitral valve is normal in structure. Mild mitral valve  regurgitation. No evidence of mitral stenosis.   5. The aortic valve is tricuspid. Aortic valve regurgitation is not  visualized. No aortic stenosis is present.   Long term monitor 11/06/19  EKG:  EKG is  ordered today.  The ekg ordered today demonstrates sinus bradycardia 54 bpm with PACs and trigeminy pattern.  However, when accounting for PACs heart rate is 90 bpm.  No acute ST/T wave changes.  Recent Labs: 07/06/2019: B Natriuretic Peptide 158.7; Magnesium 2.0 12/04/2019: ALT 49; BUN 23; Creatinine, Ser 1.02; Hemoglobin 14.9; Platelets 175; Potassium 4.5; Sodium 138  Recent Lipid Panel    Component Value Date/Time   CHOL 83 06/15/2019 0114   CHOL 133 02/14/2017 1046   TRIG 44 06/15/2019 0114   HDL 32 (L) 06/15/2019 0114   HDL 39 (L) 02/14/2017 1046   CHOLHDL 2.6 06/15/2019 0114   VLDL 9 06/15/2019 0114   LDLCALC 42 06/15/2019 0114   LDLCALC 53 04/11/2019 0000     Home Medications   Current Meds  Medication Sig  . aspirin EC 81 MG tablet Take 81 mg by mouth daily.   [name] atorvastatin (LIPITOR) 40 MG tablet TAKE 1 TABLET BY MOUTH ONCE DAILY FOR CHOLESTEROL (Patient taking differently: Take 40 mg by mouth daily. TAKE 1 TABLET BY MOUTH ONCE DAILY FOR CHOLESTEROL)  . b complex vitamins capsule Take 1 capsule by mouth daily.  . cholecalciferol (VITAMIN D3) 25 MCG (1000 UNIT) tablet Take 1,000 Units by mouth daily.  . Cinnamon 500 MG capsule Take 500 mg  by mouth at bedtime.   . diphenhydrAMINE (BENADRYL) 25 mg capsule Take 25 mg by mouth every morning.   [name] DM-APAP-CPM (CORICIDIN HBP PO) Take 2 tablets by mouth daily as needed (allergies/cold symptoms).  . finasteride (PROSCAR) 5 MG tablet Take 1 tablet (5 mg total) by mouth daily.  . fluticasone furoate-vilanterol (BREO ELLIPTA) 100-25 MCG/INH AEPB Inhale 1 puff into the lungs daily.  . Glucose Blood (BLOOD GLUCOSE TEST STRIPS) STRP Use as directed to monitor FSBS once daily. Dx: E11.9.  . losartan (COZAAR) 50 MG tablet Take 1.5 tablets (75 mg total) by mouth daily.  . Multiple Vitamins-Minerals (MULTIVITAMIN WITH MINERALS) tablet Take 1 tablet by mouth daily.   . polycarbophil (FIBERCON) 625 MG tablet Take 625 mg by mouth 2 (two) times daily.   . vitamin C (ASCORBIC ACID) 500 MG tablet Take 500 mg by mouth daily.   [name] zinc sulfate 220 (50 Zn) MG capsule Take 1 capsule (220 mg total) by mouth daily.  . [DISCONTINUED] losartan (COZAAR) 50 MG tablet Take 1.5 tablets (75 mg total) by mouth daily.    Review of Systems      Review of Systems  Constitutional: Negative for chills, fever and malaise/fatigue.  Cardiovascular: Negative for chest pain, dyspnea on exertion, leg swelling, near-syncope, orthopnea, palpitations and syncope.  Respiratory: Negative for cough, shortness of breath and wheezing.   Gastrointestinal: Negative for nausea and vomiting.  Neurological: Positive for light-headedness. Negative  for dizziness and weakness.   All other systems reviewed and are otherwise negative except as noted above.  Physical Exam    VS:  BP (!) 150/78 (BP Location: Left Arm, Patient Position: Sitting, Cuff Size: Normal)   Pulse (!) 54   Ht _0  (1.626 m)   Wt 151 lb (68.5 kg)   SpO2 98%   BMI 25.92 kg/m  , BMI Body mass index is 25.92 kg/m. GEN: Well nourished, well developed, in no acute distress. HEENT: normal. Neck: Supple, no JVD, carotid bruits, or masses. Cardiac: RRR, no murmurs, rubs, or gallops. No clubbing, cyanosis, edema.  Radials/DP/PT 2+ and equal bilaterally.  Respiratory:  Respirations regular and unlabored, clear to auscultation bilaterally. GI: Soft, nontender, nondistended, BS + x 4. MS: No deformity or atrophy. Skin: Warm and dry, no rash. Neuro:  Strength and sensation are intact. Psych: Normal affect.  Assessment & Plan    1. HTN/Orthostatic hypotension -  Improvement in orthostasis symptoms since reduction dose of Losartan. Encouraged adequate hydration, slow position changes. Recent echo with normal LVEF and no significant valvular abnormalities. Continue Losartan at present dose - he has been taking 1.25 tablets of his 14m tablet. BP goal of <140/90. May be able to further reduce Losartan to 516mdaily. He will monitor BP at home.   2. Paroxysmal atrial flutter - Noted in 2014. Previous atrial flutter in setting of lung cancer and recent diagnosis of neuroendocrine tumor. Recent 14 day ZIO with no evidence of recurrence.   3. Lung cancer - Continue to follow with pulmonology and oncology.   4. Coronary artery calcification on CT/Aortic atherosclerosis - Stable finding. No anginal symptoms. EKG today with no acute ST/T wave changes. No indication for ischemic evaluation at this time.   5. DOE - Likely multifactorial deconditioning, lung cancer. Recent echo and ZI ounrevealing. Reports improvement in dyspnea.   Disposition: Follow up in 6 month(s) with Dr.  GoRockey Situr APP.   CaLoel DubonnetNP 12/19/2019, 7:06 PM

## 2019-12-19 NOTE — Patient Instructions (Signed)
Medication Instructions:  No medication changes today.   *If you need a refill on your cardiac medications before your next appointment, please call your pharmacy*  Lab Work: No lab work today.   Testing/Procedures: Your EKG today shows normal sinus rhythm with PACs (premature atrial contractions) which are early beats in the top chambers of the heart that are not dangerous.  Follow-Up: At [facility], you and your health needs are our priority.  As part of our continuing mission to provide you with exceptional heart care, we have created designated Provider Care Teams.  These Care Teams include your primary Cardiologist (physician) and Advanced Practice Providers (APPs -  Physician Assistants and Nurse Practitioners) who all work together to provide you with the care you need, when you need it.  We recommend signing up for the patient portal called "MyChart".  Sign up information is provided on this After Visit Summary.  MyChart is used to connect with patients for Virtual Visits (Telemedicine).  Patients are able to view lab/test results, encounter notes, upcoming appointments, etc.  Non-urgent messages can be sent to your provider as well.   To learn more about what you can do with MyChart, go to [email or web address].    Your next appointment:   4-6 month(s)  The format for your next appointment:   In Person  Provider:   You may see Ida Rogue, MD or one of the following Advanced Practice Providers on your designated Care Team:    Murray Hodgkins, NP  Christell Faith, PA-C  Marrianne Mood, PA-C  Laurann Montana, NP  Other Instructions   Premature Atrial Contraction  A premature atrial contraction [facility]) is a kind of irregular heartbeat (arrhythmia). It happens when the heart beats too early and then pauses before beating again. The heart has four areas, or chambers. Normally, electrical signals spread across the heart and make all the chambers beat together. During a  PAC, the upper chambers of the heart (atria) beat too early, before they have had time to fill with blood. The heartbeat pauses afterward so the heart can fill with blood for the next beat. Sometimes PAC can be a warning sign of another type of arrhythmia called atrial fibrillation. Atrial fibrillation may allow blood to pool in the atria and form clots. If a clot travels to the brain, it can cause a stroke. What are the causes? The cause of this condition is often unknown. Sometimes, this condition may be caused by heart disease or injury to the heart. What increases the risk? You are more likely to develop this condition if:  You are a child.  You are an adult who is 77 years of age or older. Episodes may be triggered by:  Caffeine.  Alcohol.  Tobacco use.  Stimulant drugs.  Some medicines or supplements.  Stress.  Heart disease. What are the signs or symptoms? Symptoms of this condition include:  A feeling that your heart skipped a beat. The first heartbeat after the "skipped" beat may feel more forceful.  A feeling that your heart is fluttering. How is this diagnosed? This condition is diagnosed based on:  Your symptoms.  A physical exam. Your health care provider may listen to your heart.  An electrocardiogram (ECG). This is a test that records the electrical impulses of the heart.  An ambulatory cardiac monitor. This device records your heartbeats for 24 hours or more. You may also have:  An echocardiogram to check for any heart conditions. This is a type of imaging  test that uses sound waves (ultrasound) to make images of your heart.  Blood tests. How is this treated? Treatment depends on the frequency of your symptoms and other risk factors. Treatments may include:  Medicines (beta-blockers).  Catheter ablation. This is done to destroy the part of the heart tissue that sends abnormal signals. In some cases, treatment may not be needed for this  condition. Follow these instructions at home: Lifestyle  Do not use any products that contain nicotine or tobacco, such as cigarettes, e-cigarettes, and chewing tobacco. If you need help quitting, ask your health care provider.  Exercise regularly. Ask your health care provider what type of exercise is safe for you.  Find healthy ways to manage stress.  Try to get at least 7-9 hours of sleep each night, or as much as recommended by your health care provider. Alcohol use  Do not drink alcohol if: ? Your health care provider tells you not to drink. ? You are pregnant, may be pregnant, or are planning to become pregnant. ? Alcohol triggers your episodes.  If you drink alcohol: ? Limit how much you use to:  0-1 drink a day for women.  0-2 drinks a day for men. ? Be aware of how much alcohol is in your drink. In the [place] one drink equals one 12 oz bottle of beer (355 mL), one 5 oz glass of wine (148 mL), or one 1 oz glass of hard liquor (44 mL). General instructions  Take over-the-counter and prescription medicines only as told by your health care provider.  If caffeine triggers episodes, do not eat, drink, or use anything with caffeine in it.  Keep all follow-up visits as told by your health care provider. This is important. Contact a health care provider if:  You feel your heart skipping beats.  Your heart skips beats and you feel dizzy, light-headed, or very tired. Summary  A premature atrial contraction [facility]) is a kind of irregular heartbeat (arrhythmia). It happens when the heart beats too early and then pauses before beating again.  Treatment depends on your symptoms and whether you have other underlying heart conditions.  Contact a health care provider if your heart skips beats and you feel dizzy, light-headed, or very tired.  In some cases, this condition may lead to a stroke. "BE FAST" is an easy way to remember the warning signs of stroke. Get help right away if  you have any of the "BE FAST" signs. This information is not intended to replace advice given to you by your health care provider. Make sure you discuss any questions you have with your health care provider. Document Revised: 01/19/2018 Document Reviewed: 01/19/2018 Elsevier Patient Education  [place].  Orthostatic Hypotension Blood pressure is a measurement of how strongly, or weakly, your blood is pressing against the walls of your arteries. Orthostatic hypotension is a sudden drop in blood pressure that happens when you quickly change positions, such as when you get up from sitting or lying down. Arteries are blood vessels that carry blood from your heart throughout your body. When blood pressure is too low, you may not get enough blood to your brain or to the rest of your organs. This can cause weakness, light-headedness, rapid heartbeat, and fainting. This can last for just a few seconds or for up to a few minutes. Orthostatic hypotension is usually not a serious problem. However, if it happens frequently or gets worse, it may be a sign of something more serious. What  are the causes? This condition may be caused by:  Sudden changes in posture, such as standing up quickly after you have been sitting or lying down.  Blood loss.  Loss of body fluids (dehydration).  Heart problems.  Hormone (endocrine) problems.  Pregnancy.  Severe infection.  Lack of certain nutrients.  Severe allergic reactions (anaphylaxis).  Certain medicines, such as blood pressure medicine or medicines that make the body lose excess fluids (diuretics). Sometimes, this condition can be caused by not taking medicine as directed, such as taking too much of a certain medicine. What increases the risk? The following factors may make you more likely to develop this condition:  Age. Risk increases as you get older.  Conditions that affect the heart or the central nervous system.  Taking certain  medicines, such as blood pressure medicine or diuretics.  Being pregnant. What are the signs or symptoms? Symptoms of this condition may include:  Weakness.  Light-headedness.  Dizziness.  Blurred vision.  Fatigue.  Rapid heartbeat.  Fainting, in severe cases. How is this diagnosed? This condition is diagnosed based on:  Your medical history.  Your symptoms.  Your blood pressure measurement. Your health care provider will check your blood pressure when you are: ? Lying down. ? Sitting. ? Standing. A blood pressure reading is recorded as two numbers, such as "120 over 80" (or 120/80). The first ("top") number is called the systolic pressure. It is a measure of the pressure in your arteries as your heart beats. The second ("bottom") number is called the diastolic pressure. It is a measure of the pressure in your arteries when your heart relaxes between beats. Blood pressure is measured in a unit called mm Hg. Healthy blood pressure for most adults is 120/80. If your blood pressure is below 90/60, you may be diagnosed with hypotension. Other information or tests that may be used to diagnose orthostatic hypotension include:  Your other vital signs, such as your heart rate and temperature.  Blood tests.  Tilt table test. For this test, you will be safely secured to a table that moves you from a lying position to an upright position. Your heart rhythm and blood pressure will be monitored during the test. How is this treated? This condition may be treated by:  Changing your diet. This may involve eating more salt (sodium) or drinking more water.  Taking medicines to raise your blood pressure.  Changing the dosage of certain medicines you are taking that might be lowering your blood pressure.  Wearing compression stockings. These stockings help to prevent blood clots and reduce swelling in your legs. In some cases, you may need to go to the hospital for:  Fluid replacement.  This means you will receive fluids through an IV.  Blood replacement. This means you will receive donated blood through an IV (transfusion).  Treating an infection or heart problems, if this applies.  Monitoring. You may need to be monitored while medicines that you are taking wear off. Follow these instructions at home: Eating and drinking   Drink enough fluid to keep your urine pale yellow.  Eat a healthy diet, and follow instructions from your health care provider about eating or drinking restrictions. A healthy diet includes: ? Fresh fruits and vegetables. ? Whole grains. ? Lean meats. ? Low-fat dairy products.  Eat extra salt only as directed. Do not add extra salt to your diet unless your health care provider told you to do that.  Eat frequent, small meals.  Avoid  standing up suddenly after eating. Medicines  Take over-the-counter and prescription medicines only as told by your health care provider. ? Follow instructions from your health care provider about changing the dosage of your current medicines, if this applies. ? Do not stop or adjust any of your medicines on your own. General instructions   Wear compression stockings as told by your health care provider.  Get up slowly from lying down or sitting positions. This gives your blood pressure a chance to adjust.  Avoid hot showers and excessive heat as directed by your health care provider.  Return to your normal activities as told by your health care provider. Ask your health care provider what activities are safe for you.  Do not use any products that contain nicotine or tobacco, such as cigarettes, e-cigarettes, and chewing tobacco. If you need help quitting, ask your health care provider.  Keep all follow-up visits as told by your health care provider. This is important. Contact a health care provider if you:  Vomit.  Have diarrhea.  Have a fever for more than 2-3 days.  Feel more thirsty than  usual.  Feel weak and tired. Get help right away if you:  Have chest pain.  Have a fast or irregular heartbeat.  Develop numbness in any part of your body.  Cannot move your arms or your legs.  Have trouble speaking.  Become sweaty or feel light-headed.  Faint.  Feel short of breath.  Have trouble staying awake.  Feel confused. Summary  Orthostatic hypotension is a sudden drop in blood pressure that happens when you quickly change positions.  Orthostatic hypotension is usually not a serious problem.  It is diagnosed by having your blood pressure taken lying down, sitting, and then standing.  It may be treated by changing your diet or adjusting your medicines. This information is not intended to replace advice given to you by your health care provider. Make sure you discuss any questions you have with your health care provider. Document Revised: 10/20/2017 Document Reviewed: 10/20/2017 Elsevier Patient Education  [place].

## 2019-12-21 ENCOUNTER — Inpatient Hospital Stay: Payer: Medicare Other | Attending: Oncology

## 2019-12-21 ENCOUNTER — Ambulatory Visit
Admission: RE | Admit: 2019-12-21 | Discharge: 2019-12-21 | Disposition: A | Discharge status: Home / Self Care | Payer: Medicare Other | Source: Ambulatory Visit | Attending: Oncology | Admitting: Oncology

## 2019-12-21 ENCOUNTER — Other Ambulatory Visit: Payer: Self-pay

## 2019-12-21 DIAGNOSIS — R338 Other retention of urine: Secondary | ICD-10-CM | POA: Insufficient documentation

## 2019-12-21 DIAGNOSIS — Z79899 Other long term (current) drug therapy: Secondary | ICD-10-CM | POA: Insufficient documentation

## 2019-12-21 DIAGNOSIS — C787 Secondary malignant neoplasm of liver and intrahepatic bile duct: Secondary | ICD-10-CM | POA: Diagnosis not present

## 2019-12-21 DIAGNOSIS — C7B8 Other secondary neuroendocrine tumors: Secondary | ICD-10-CM | POA: Insufficient documentation

## 2019-12-21 DIAGNOSIS — N401 Enlarged prostate with lower urinary tract symptoms: Secondary | ICD-10-CM | POA: Diagnosis not present

## 2019-12-21 DIAGNOSIS — C7A8 Other malignant neuroendocrine tumors: Secondary | ICD-10-CM | POA: Insufficient documentation

## 2019-12-21 LAB — CBC WITH DIFFERENTIAL/PLATELET
Abs Immature Granulocytes: 0.04 10*3/uL (ref 0.00–0.07)
Basophils Absolute: 0.1 10*3/uL (ref 0.0–0.1)
Basophils Relative: 1 %
Eosinophils Absolute: 0.1 10*3/uL (ref 0.0–0.5)
Eosinophils Relative: 1 %
HCT: 47 % (ref 39.0–52.0)
Hemoglobin: 15.9 g/dL (ref 13.0–17.0)
Immature Granulocytes: 0 %
Lymphocytes Relative: 11 %
Lymphs Abs: 1.1 10*3/uL (ref 0.7–4.0)
MCH: 29.4 pg (ref 26.0–34.0)
MCHC: 33.8 g/dL (ref 30.0–36.0)
MCV: 87 fL (ref 80.0–100.0)
Monocytes Absolute: 1.5 10*3/uL — ABNORMAL HIGH (ref 0.1–1.0)
Monocytes Relative: 16 %
Neutro Abs: 6.6 10*3/uL (ref 1.7–7.7)
Neutrophils Relative %: 71 %
Platelets: 171 10*3/uL (ref 150–400)
RBC: 5.4 MIL/uL (ref 4.22–5.81)
RDW: 14.7 % (ref 11.5–15.5)
WBC: 9.3 10*3/uL (ref 4.0–10.5)
nRBC: 0 % (ref 0.0–0.2)

## 2019-12-21 LAB — COMPREHENSIVE METABOLIC PANEL
ALT: 25 U/L (ref 0–44)
AST: 27 U/L (ref 15–41)
Albumin: 4.2 g/dL (ref 3.5–5.0)
Alkaline Phosphatase: 137 U/L — ABNORMAL HIGH (ref 38–126)
Anion gap: 11 (ref 5–15)
BUN: 21 mg/dL (ref 8–23)
CO2: 26 mmol/L (ref 22–32)
Calcium: 9.8 mg/dL (ref 8.9–10.3)
Chloride: 104 mmol/L (ref 98–111)
Creatinine, Ser: 0.97 mg/dL (ref 0.61–1.24)
GFR calc Af Amer: >60 mL/min (ref >60)
GFR calc non Af Amer: >60 mL/min (ref >60)
Glucose, Bld: 105 mg/dL — ABNORMAL HIGH (ref 70–99)
Potassium: 4.8 mmol/L (ref 3.5–5.1)
Sodium: 141 mmol/L (ref 135–145)
Total Bilirubin: 0.9 mg/dL (ref 0.3–1.2)
Total Protein: 7.2 g/dL (ref 6.5–8.1)

## 2019-12-21 MED ORDER — GADOBUTROL 1 MMOL/ML IV SOLN
6.0000 mL | Freq: Once | INTRAVENOUS | Status: AC | PRN
  Administered 2019-12-21: 6 mL via INTRAVENOUS

## 2019-12-25 LAB — CHROMOGRANIN A: Chromogranin A (ng/mL): 300.6 ng/mL — ABNORMAL HIGH (ref 0.0–101.8)

## 2019-12-27 NOTE — Progress Notes (Signed)
[place]  [phone] [phone]  ID: Jason Malone OB: 1940/07/09  MR#: [id number]  VHQ#:[id number]  Patient Care Team: Towanda Malkin, MD as PCP - General (Internal Medicine) Rockey Situ Kathlene November, MD as PCP - Cardiology (Cardiology) [name], Forest Gleason, MD (General Surgery) Nestor Lewandowsky, MD as Referring Physician (Cardiothoracic Surgery) Minna Merritts, MD as Consulting Physician (Cardiology) [name], MD as Consulting Physician (Cardiothoracic Surgery) Lloyd Huger, MD as Consulting Physician (Oncology) Arnetha Courser, MD as Attending Physician (Family Medicine)  I connected with Jason Malone on 01/01/20 at 10:15 AM EDT by video enabled telemedicine visit and verified that I am speaking with the correct person using two identifiers.   I discussed the limitations, risks, security and privacy concerns of performing an evaluation and management service by telemedicine and the availability of in-person appointments. I also discussed with the patient that there may be a patient responsible charge related to this service. The patient expressed understanding and agreed to proceed.   Other persons participating in the visit and their role in the encounter: Patient, MD.  Patient's location: Clinic. Provider's location: Home.  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  INTERVAL HISTORY: Patient agreed to video assisted telemedicine visit for further evaluation, discussion of his imaging results, and continuation of lanreotide. He currently feels well and is asymptomatic. He does not complain of pain. He denies any weakness or fatigue today.  He has no neurologic complaints.  He has a good appetite and denies weight loss.  He denies any chest pain, shortness of breath, cough, or hemoptysis.  He denies any abdominal pain.  He has no nausea, vomiting, constipation, or diarrhea.  He has no  urinary complaints. Patient offers no specific complaints today.  REVIEW OF SYSTEMS:   Review of Systems  Constitutional: Negative.  Negative for fever, malaise/fatigue and weight loss.  Respiratory: Negative.  Negative for cough, hemoptysis and shortness of breath.   Cardiovascular: Negative.  Negative for chest pain and leg swelling.  Gastrointestinal: Negative.  Negative for abdominal pain, diarrhea, nausea and vomiting.  Genitourinary: Negative.  Negative for dysuria.  Musculoskeletal: Negative.  Negative for back pain.  Skin: Negative.  Negative for rash.  Neurological: Negative.  Negative for dizziness, sensory change, focal weakness and weakness.  Psychiatric/Behavioral: Negative.  The patient is not nervous/anxious.     As per HPI. Otherwise, a complete review of systems is negative.  PAST MEDICAL HISTORY: Past Medical History:  Diagnosis Date  . Allergic rhinitis   . Benign prostatic hypertrophy with lower urinary tract symptoms (LUTS)   . Chest pain    a. 05/2013 MV: EF 70%, no ischemia.  . Diverticulitis    DIVERTICULOSIS  . Enlarged prostate   . Essential hypertension    CONTROLLED ON MEDS  . Full dentures    upper and lower  . H/O hypokalemia   . Hx of atrial fibrillation without current medication    CARDIOLOGIST-DR [facility]  . 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 Hyperlipemia   . Hypomagnesemia   . IFG (impaired fasting glucose)   . Liver cancer ([place])   . Liver cancer ([place]) 12/08/2016  . Liver mass, left lobe 08/28/2016   Probably hemangioma; will get MRI of liver, refer to GI  . Lung cancer ([place])    a. carcinoid, left lung, Stage 1b (T2a, N0, cM0);  b. 05/2013 s/p VATS & LULobectomy.  [name]  Monocytosis   . Osteopenia   . Paroxysmal atrial flutter ([facility])    a. 03/2013->no recurrence;  b. CHA2DS2VASc = 2-->not currently on anticoagulation;  c. 05/2012 Echo: EF 55-60%, normal RV.  [name] Wears glasses     PAST SURGICAL HISTORY: Past  Surgical History:  Procedure Laterality Date  . BRONCHOSCOPY     04/06/2013  . CARDIOVASCULAR STRESS TEST  05/2013   a. No evidence of ischemia or infarct, EF 70%, no WMAs  . CATARACT EXTRACTION W/PHACO Left 07/14/2015   Procedure: CATARACT EXTRACTION PHACO AND INTRAOCULAR LENS PLACEMENT (IOC);  Surgeon: [name], MD;  Location: [facility];  Service: Ophthalmology;  Laterality: Left;  . CATARACT EXTRACTION W/PHACO Right 10/01/2015   Procedure: CATARACT EXTRACTION PHACO AND INTRAOCULAR LENS PLACEMENT (IOC);  Surgeon: [name], MD;  Location: [place];  Service: Ophthalmology;  Laterality: Right;  . COLONOSCOPY W/ POLYPECTOMY     bleed after-had to go to surgery to stop bleeding via colonoscopy  . COLONOSCOPY WITH PROPOFOL N/A 11/19/2015   Procedure: COLONOSCOPY WITH PROPOFOL;  Surgeon: [name], MD;  Location: [facility] ENDOSCOPY;  Service: Endoscopy;  Laterality: N/A;  . DUPUYTREN CONTRACTURE RELEASE  12/15/2011   Procedure: DUPUYTREN CONTRACTURE RELEASE;  Surgeon: [name], MD;  Location: [place];  Service: Orthopedics;  Laterality: Left;  Fasciotomy left ring finger dupuytrens  . ELECTROMAGNETIC NAVIGATION BROCHOSCOPY Left 09/19/2019   Procedure: ELECTROMAGNETIC NAVIGATION BRONCHOSCOPY;  Surgeon: Tyler Pita, MD;  Location: [facility];  Service: Cardiopulmonary;  Laterality: Left;  . FASCIECTOMY Right 11/07/2018   Procedure: SEGMENTAL FASCIECTOMY RIGHT RING FINGER;  Surgeon: Daryll Brod, MD;  Location: [place];  Service: Orthopedics;  Laterality: Right;  AXILLARY BLOCK  . FLEXIBLE BRONCHOSCOPY Left 11/26/2019   Procedure: FLEXIBLE BRONCHOSCOPY WITH CRYO THERAPY;  Surgeon: Tyler Pita, MD;  Location: [facility];  Service: Cardiopulmonary;  Laterality: Left;  . IR ANGIOGRAM SELECTIVE EACH ADDITIONAL VESSEL  07/26/2017  . IR ANGIOGRAM SELECTIVE EACH ADDITIONAL VESSEL  07/26/2017  . IR ANGIOGRAM SELECTIVE EACH  ADDITIONAL VESSEL  07/26/2017  . IR ANGIOGRAM SELECTIVE EACH ADDITIONAL VESSEL  07/26/2017  . IR ANGIOGRAM SELECTIVE EACH ADDITIONAL VESSEL  07/26/2017  . IR ANGIOGRAM SELECTIVE EACH ADDITIONAL VESSEL  08/11/2017  . IR ANGIOGRAM SELECTIVE EACH ADDITIONAL VESSEL  04/28/2018  . IR ANGIOGRAM VISCERAL SELECTIVE  07/26/2017  . IR ANGIOGRAM VISCERAL SELECTIVE  08/11/2017  . IR ANGIOGRAM VISCERAL SELECTIVE  04/28/2018  . IR EMBO ARTERIAL NOT HEMORR HEMANG INC GUIDE ROADMAPPING  07/26/2017  . IR EMBO TUMOR ORGAN ISCHEMIA INFARCT INC GUIDE ROADMAPPING  08/11/2017  . IR EMBO TUMOR ORGAN ISCHEMIA INFARCT INC GUIDE ROADMAPPING  04/28/2018  . IR RADIOLOGIST EVAL & MGMT  07/07/2017  . IR RADIOLOGIST EVAL & MGMT  09/07/2017  . IR RADIOLOGIST EVAL & MGMT  12/21/2017  . IR RADIOLOGIST EVAL & MGMT  03/22/2018  . IR RADIOLOGIST EVAL & MGMT  06/08/2018  . IR RADIOLOGIST EVAL & MGMT  11/01/2018  . IR RADIOLOGIST EVAL & MGMT  03/21/2019  . IR RADIOLOGIST EVAL & MGMT  11/08/2019  . IR US GUIDE VASC ACCESS RIGHT  07/26/2017  . IR US GUIDE VASC ACCESS RIGHT  08/11/2017  . IR US GUIDE VASC ACCESS RIGHT  04/28/2018  . LUNG LOBECTOMY  06/10/13   upper left lung  . MULTIPLE TOOTH EXTRACTIONS    . REMOVAL RETAINED LENS Right 11/17/2015   Procedure: REMOVAL RETAINED LENS FROAGMENTS RIGHT EYE;  Surgeon: Nila Nephew  Brasington, MD;  Location: [place];  Service: Ophthalmology;  Laterality: Right;  . TONSILLECTOMY    . UPPER GASTROINTESTINAL ENDOSCOPY  11-03-15   Dr [name]  . VASECTOMY    . VIDEO ASSISTED THORACOSCOPY (VATS)/WEDGE RESECTION Left 06/04/2013   Procedure: VIDEO ASSISTED THORACOSCOPY (VATS)/WEDGE RESECTION;  Surgeon: [name], MD;  Location: [place];  Service: Thoracic;  Laterality: Left;  [name] VIDEO BRONCHOSCOPY N/A 06/04/2013   Procedure: VIDEO BRONCHOSCOPY;  Surgeon: [name], MD;  Location: [facility] OR;  Service: Thoracic;  Laterality: N/A;    FAMILY HISTORY: Family History  Problem Relation Age of Onset   . Stroke Mother   . Alzheimer's disease Mother   . Hypertension Sister   . Hyperlipidemia Sister   . Hypertension Brother   . Hyperlipidemia Brother   . Diabetes Brother     ADVANCED DIRECTIVES (Y/N):  N  HEALTH MAINTENANCE: Social History   Tobacco Use  . Smoking status: Former Smoker    Packs/day: 1.00    Years: 40.00    Pack years: 40.00    Types: Cigarettes    Quit date: 12/09/1998    Years since quitting: 21.0  . Smokeless tobacco: Never Used  Vaping Use  . Vaping Use: Never used  Substance Use Topics  . Alcohol use: No  . Drug use: No     Colonoscopy:  PAP:  Bone density:  Lipid panel:  Allergies  Allergen Reactions  . Tape Itching    Surgical tapes     Current Outpatient Medications  Medication Sig Dispense Refill  . aspirin EC 81 MG tablet Take 81 mg by mouth daily.     [name] atorvastatin (LIPITOR) 40 MG tablet TAKE 1 TABLET BY MOUTH ONCE DAILY FOR CHOLESTEROL (Patient taking differently: Take 40 mg by mouth daily. TAKE 1 TABLET BY MOUTH ONCE DAILY FOR CHOLESTEROL) 90 tablet 1  . b complex vitamins capsule Take 1 capsule by mouth daily.    . cholecalciferol (VITAMIN D3) 25 MCG (1000 UNIT) tablet Take 1,000 Units by mouth daily.    . Cinnamon 500 MG capsule Take 500 mg by mouth at bedtime.     . diphenhydrAMINE (BENADRYL) 25 mg capsule Take 25 mg by mouth every morning.     [name] DM-APAP-CPM (CORICIDIN HBP PO) Take 2 tablets by mouth daily as needed (allergies/cold symptoms).    . finasteride (PROSCAR) 5 MG tablet Take 1 tablet (5 mg total) by mouth daily. 90 tablet 1  . fluticasone furoate-vilanterol (BREO ELLIPTA) 100-25 MCG/INH AEPB Inhale 1 puff into the lungs daily. 28 each 11  . Glucose Blood (BLOOD GLUCOSE TEST STRIPS) STRP Use as directed to monitor FSBS once daily. Dx: E11.9. 50 strip 1  . losartan (COZAAR) 50 MG tablet Take 1.5 tablets (75 mg total) by mouth daily. 135 tablet 1  . Multiple Vitamins-Minerals (MULTIVITAMIN WITH MINERALS) tablet Take 1  tablet by mouth daily.     . polycarbophil (FIBERCON) 625 MG tablet Take 625 mg by mouth 2 (two) times daily.     . vitamin C (ASCORBIC ACID) 500 MG tablet Take 500 mg by mouth daily.     [name] zinc sulfate 220 (50 Zn) MG capsule Take 1 capsule (220 mg total) by mouth daily. 30 capsule 0   No current facility-administered medications for this visit.   Facility-Administered Medications Ordered in Other Visits  Medication Dose Route Frequency Provider Last Rate Last Admin  . lanreotide acetate (SOMATULINE DEPOT) injection 120 mg  120 mg Subcutaneous  Once Lloyd Huger, MD      . lanreotide acetate (SOMATULINE DEPOT) injection 120 mg  120 mg Subcutaneous Once Lloyd Huger, MD        OBJECTIVE: Vitals:   01/01/20 1008  BP: 135/72  Pulse: 62  Resp: 20  Temp: (!) 96.8 F (36 C)  SpO2: 97%     Body mass index is 26.43 kg/m.    ECOG FS:0 - Asymptomatic  General: Well-developed, well-nourished, no acute distress. HEENT: Normocephalic. Neuro: Alert, answering all questions appropriately. Cranial nerves grossly intact. Psych: Normal affect.   LAB RESULTS:  Lab Results  Component Value Date   NA 141 12/21/2019   K 4.8 12/21/2019   CL 104 12/21/2019   CO2 26 12/21/2019   GLUCOSE 105 (H) 12/21/2019   BUN 21 12/21/2019   CREATININE 0.97 12/21/2019   CALCIUM 9.8 12/21/2019   PROT 7.2 12/21/2019   ALBUMIN 4.2 12/21/2019   AST 27 12/21/2019   ALT 25 12/21/2019   ALKPHOS 137 (H) 12/21/2019   BILITOT 0.9 12/21/2019   GFRNONAA >60 12/21/2019   GFRAA >60 12/21/2019    Lab Results  Component Value Date   WBC 9.3 12/21/2019   NEUTROABS 6.6 12/21/2019   HGB 15.9 12/21/2019   HCT 47.0 12/21/2019   MCV 87.0 12/21/2019   PLT 171 12/21/2019     STUDIES: MR Abdomen W Wo Contrast  Result Date: 12/21/2019 CLINICAL DATA:  Follow-up stage IV metastatic neuroendocrine tumor to liver. Primary lesion of bronchogenic origin. A status post yttrium 90 micro severe radio  embolization to the LEFT RIGHT hepatic lobe 2019. EXAM: MRI ABDOMEN WITHOUT AND WITH CONTRAST TECHNIQUE: Multiplanar multisequence MR imaging of the abdomen was performed both before and after the administration of intravenous contrast. CONTRAST:  82m GADAVIST GADOBUTROL 1 MMOL/ML IV SOLN COMPARISON:  CT 11/02/2019, MRI 03/15/2019 FINDINGS: Lower chest:  Lung bases are clear. Hepatobiliary: Lesions are best detected as T2 hyperintensities on T2 weighted diffusion imaging (series 4). Lesions are not changed in size or number from comparison MRI. Small lesion in the RIGHT hepatic lobe measuring 9 mm (image 19/4). Large lesion along the medial aspect of the central LEFT hepatic lobe measuring 21 mm on image 16/4. These lesions also depicted on postcontrast T1 weighted imaging on image 32/15 in LEFT hepatic lobe. RIGHT hepatic lobe lesion identified image 39/series 11 The gallbladder is collapsed. Pancreas: Normal pancreatic parenchymal intensity. No ductal dilatation or inflammation. Spleen: Normal spleen. Adrenals/urinary tract: Adrenal glands and kidneys are normal. Stomach/Bowel: Stomach and limited of the small bowel is unremarkable Vascular/Lymphatic: Abdominal aortic normal caliber. No retroperitoneal periportal lymphadenopathy. Musculoskeletal: No aggressive osseous lesion IMPRESSION: 1. Stable hepatic metastasis compared to comparison CT 11/02/2019 and more remote MRI 03/15/2019 2. No new or progressive liver metastasis. 3. Consider gallium 65DOTATATE PET scan for tumor receptor evaluation for potential future peptide receptor radiotherapy. Electronically Signed   By: SSuzy BouchardM.D.   On: 12/21/2019 14:40   ECHOCARDIOGRAM COMPLETE  Result Date: 12/11/2019    ECHOCARDIOGRAM REPORT   Patient Name:   DMUSTAPHA COLSONCROSS Date of Exam: 12/11/2019 Medical Rec #:  [id number]    Height:       64.0 in Accession #:    [id number]   Weight:       154.8 lb Date of Birth:  6Dec 09, 1942     BSA:          1.755 m Patient  Age:    89years  BP:           120/68 mmHg Patient Gender: M             HR:           56 bpm. Exam Location:  [place] Procedure: 2D Echo, Cardiac Doppler and Color Doppler Indications:    R06.02 SOB  History:        Patient has prior history of Echocardiogram examinations, most                 recent 03/29/2013. CAD, Arrythmias:Atrial Flutter and                 Bradycardia, Signs/Symptoms:Shortness of Breath; Risk                 Factors:Hypertension, Diabetes, Dyslipidemia and Former Smoker.  Sonographer:    Pilar Jarvis RDMS, RVT, RDCS Referring Phys: [id number] [place]  1. Left ventricular ejection fraction, by estimation, is 55 to 60%. The left ventricle has normal function. The left ventricle has no regional wall motion abnormalities. There is mild left ventricular hypertrophy. Left ventricular diastolic parameters are consistent with Grade II diastolic dysfunction (pseudonormalization).  2. Right ventricular systolic function is normal. The right ventricular size is normal. Tricuspid regurgitation signal is inadequate for assessing PA pressure.  3. Left atrial size was mild to moderately dilated.  4. The mitral valve is normal in structure. Mild mitral valve regurgitation. No evidence of mitral stenosis.  5. The aortic valve is tricuspid. Aortic valve regurgitation is not visualized. No aortic stenosis is present. FINDINGS  Left Ventricle: Left ventricular ejection fraction, by estimation, is 55 to 60%. The left ventricle has normal function. The left ventricle has no regional wall motion abnormalities. The left ventricular internal cavity size was normal in size. There is  mild left ventricular hypertrophy. Left ventricular diastolic parameters are consistent with Grade II diastolic dysfunction (pseudonormalization). Right Ventricle: The right ventricular size is normal. No increase in right ventricular wall thickness. Right ventricular systolic function is normal. Tricuspid  regurgitation signal is inadequate for assessing PA pressure. Left Atrium: Left atrial size was mild to moderately dilated. Right Atrium: Right atrial size was normal in size. Pericardium: There is no evidence of pericardial effusion. Mitral Valve: The mitral valve is normal in structure. Mild mitral valve regurgitation. No evidence of mitral valve stenosis. Tricuspid Valve: The tricuspid valve is grossly normal. Tricuspid valve regurgitation is not demonstrated. Aortic Valve: The aortic valve is tricuspid. Aortic valve regurgitation is not visualized. No aortic stenosis is present. Aortic valve mean gradient measures 3.0 mmHg. Aortic valve peak gradient measures 4.8 mmHg. Aortic valve area, by VTI measures 2.86 cm. Pulmonic Valve: The pulmonic valve was normal in structure. Pulmonic valve regurgitation is trivial. No evidence of pulmonic stenosis. Aorta: The aortic root and ascending aorta are structurally normal, with no evidence of dilitation. Pulmonary Artery: The pulmonary artery is not well seen. Venous: The inferior vena cava was not well visualized. IAS/Shunts: No atrial level shunt detected by color flow Doppler.  LEFT VENTRICLE PLAX 2D LVIDd:         4.50 cm     Diastology LVIDs:         3.30 cm     LV e' lateral:   9.57 cm/s LV PW:         1.10 cm     LV E/e' lateral: 7.3 LV IVS:        1.20 cm  LV e' medial:    7.94 cm/s LVOT diam:     2.10 cm     LV E/e' medial:  8.8 LV SV:         72 LV SV Index:   41 LVOT Area:     3.46 cm  LV Volumes (MOD) LV vol d, MOD A2C: 78.8 ml LV vol d, MOD A4C: 69.6 ml LV vol s, MOD A2C: 35.9 ml LV vol s, MOD A4C: 31.4 ml LV SV MOD A2C:     42.9 ml LV SV MOD A4C:     69.6 ml LV SV MOD BP:      40.6 ml RIGHT VENTRICLE RV Basal diam:  3.30 cm RV S prime:     11.30 cm/s TAPSE (M-mode): 1.8 cm LEFT ATRIUM             Index       RIGHT ATRIUM           Index LA diam:        4.70 cm 2.68 cm/m  RA Area:     13.30 cm LA Vol (A2C):   89.7 ml 51.12 ml/m RA Volume:   28.60 ml   16.30 ml/m LA Vol (A4C):   61.5 ml 35.05 ml/m LA Biplane Vol: 76.8 ml 43.77 ml/m  AORTIC VALVE                   PULMONIC VALVE AV Area (Vmax):    2.82 cm    PV Vmax:       0.60 m/s AV Area (Vmean):   2.72 cm    PV Peak grad:  1.5 mmHg AV Area (VTI):     2.86 cm AV Vmax:           109.00 cm/s AV Vmean:          74.500 cm/s AV VTI:            0.253 m AV Peak Grad:      4.8 mmHg AV Mean Grad:      3.0 mmHg LVOT Vmax:         88.80 cm/s LVOT Vmean:        58.500 cm/s LVOT VTI:          0.209 m LVOT/AV VTI ratio: 0.83  AORTA Ao Root diam: 2.90 cm Ao Asc diam:  3.10 cm Ao Arch diam: 2.7 cm MITRAL VALVE MV Area (PHT): 4.21 cm    SHUNTS MV Decel Time: 180 msec    Systemic VTI:  0.21 m MV E velocity: 69.80 cm/s  Systemic Diam: 2.10 cm MV A velocity: 48.00 cm/s MV E/A ratio:  1.45 Harrell Gave End MD Electronically signed by Nelva Bush MD Signature Date/Time: 12/11/2019/4:53:37 PM    Final    LONG TERM MONITOR (3-14 DAYS)  Result Date: 12/19/2019 Event Monitor Normal sinus rhythm avg HR of 63 bpm. 27 Supraventricular Tachycardia runs occurred, the run with the fastest interval lasting 10 beats with a max rate of 164 bpm, the longest lasting 15 beats with an avg rate of 98 bpm. Isolated SVEs were occasional (3.8%, 47682), SVE Couplets were rare (<1.0%, 595), and SVE Triplets were rare (<1.0%, 84). Isolated VEs were occasional (1.3%, 16635), VE Couplets were rare (<1.0%, 22), and no VE Triplets were present. Ventricular Bigeminy and Trigeminy were present. No patient triggered events Signed, Esmond Plants, MD, Ph.D [facility]    ASSESSMENT: 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  PLAN:    1. 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 Patient underwent left upper lobe lobectomy in [place] on June 04, 2013. Final pathology results revealed a well differentiated  neuroendocrine tumor with positive bronchial margins. Patient did not receive adjuvant XRT or chemotherapy at that time. Patient's most recent abdominal MRI on December 21, 2019 reviewed independently and reported as above with no obvious evidence of progression or recurrent disease. His most recent Y 90 ablation occurred on April 28, 2018.  Patient's chromogranin a levels are elevated, but relatively stable ranging from 213-261. Proceed with 120 mg subcutaneous lanreotide today. Return to clinic in 4 and 8 weeks for laboratory work and treatment only. Patient will then return to clinic in 12 weeks for further evaluation and continuation of treatment. Plan to reimage in approximately 6 months. 2.  Left mainstem endobronchial mass: Resolved.  Consistent with neuroendocrine tumor.  Patient underwent his third and final bronchoscopy with therapeutic cryoablation ablation on November 26, 2019.  Follow-up with pulmonology as indicated.   3.  Covid: Resolved.  4.  Urinary retention: Resolved. Foley has been removed.    I spent a total of 30 minutes reviewing chart data, face-to-face evaluation with the patient, counseling and coordination of care as detailed above.    Patient expressed understanding and was in agreement with this plan. He also understands that He can call clinic at any time with any questions, concerns, or complaints.   Cancer Staging Neuroendocrine carcinoma of lung [facility]) Staging form: Lung, AJCC 8th Edition - Clinical stage from 12/28/2016: Stage IVA (cT2a, cN0, cM1b) - Signed by Lloyd Huger, MD on 12/28/2016   Lloyd Huger, MD   01/01/2020 10:36 AM

## 2019-12-31 ENCOUNTER — Encounter: Payer: Self-pay | Admitting: Oncology

## 2019-12-31 NOTE — Progress Notes (Signed)
Patient was called for assessment. He denies any pain or concerns at this time.

## 2020-01-01 ENCOUNTER — Other Ambulatory Visit: Payer: Self-pay

## 2020-01-01 ENCOUNTER — Inpatient Hospital Stay (HOSPITAL_BASED_OUTPATIENT_CLINIC_OR_DEPARTMENT_OTHER): Payer: Medicare Other | Admitting: Oncology

## 2020-01-01 ENCOUNTER — Inpatient Hospital Stay: Payer: Medicare Other

## 2020-01-01 VITALS — BP 135/72 | HR 62 | Temp 96.8°F | Resp 20 | Wt 154.0 lb

## 2020-01-01 DIAGNOSIS — C7A8 Other malignant neuroendocrine tumors: Secondary | ICD-10-CM

## 2020-01-01 DIAGNOSIS — C7B8 Other secondary neuroendocrine tumors: Secondary | ICD-10-CM | POA: Diagnosis not present

## 2020-01-01 MED ORDER — LANREOTIDE ACETATE 120 MG/0.5ML ~~LOC~~ SOLN
120.0000 mg | Freq: Once | SUBCUTANEOUS | Status: AC
  Administered 2020-01-01: 120 mg via SUBCUTANEOUS
  Filled 2020-01-01: qty 120

## 2020-01-05 ENCOUNTER — Other Ambulatory Visit: Payer: Self-pay | Admitting: Family Medicine

## 2020-01-05 DIAGNOSIS — R3911 Hesitancy of micturition: Secondary | ICD-10-CM

## 2020-01-21 ENCOUNTER — Other Ambulatory Visit: Payer: Medicare Other

## 2020-01-22 NOTE — Progress Notes (Signed)
Patient ID: [name], male    DOB: 1940/06/14, 78 y.o.   MRN: [id number]  PCP: Jason Malkin, MD  Chief Complaint  Patient presents with  . Follow-up    Subjective:   Jason Malone is a 79 y.o. male, presents to clinic with CC of the following:  Chief Complaint  Patient presents with  . Follow-up    HPI:  Patient is a 79 year old male Last visit was 10/25/2019 Follows up today. Wife present with him for the assessment.  He notes in general he has been feeling improved. he notes he still gets short of breath when up and about and trying to do things, especially in the hotter weather, although in general, it is better than previous.  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-He continues to be followed by oncology Last visit with oncology was 01/01/2020 with the following update and assessment/plan: INTERVAL HISTORY:He currently feels well and is asymptomatic. He does not complain of pain. He denies anyweakness or fatigue today. He has no neurologic complaints. He has a good appetite and denies weight loss. He denies any chest pain, shortness of breath, cough, or hemoptysis. He denies any abdominal pain. He has no nausea, vomiting, constipation, or diarrhea. He has no urinary complaints.Patient offers no specific complaints today.  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 PLAN:   1. 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 Patient underwent left upper lobe lobectomy in [place] on June 04, 2013. Final pathology results revealed a well differentiated neuroendocrine tumor with positive bronchial margins. Patient did not receive adjuvant XRT or chemotherapy at that time.Patient's most recent abdominal MRI on December 21, 2019 reviewed independently and reported as above with no  obvious evidence of progression or recurrent disease.His most recent Y 90 ablation occurred on April 28, 2018. Patient's chromogranin a levels are elevated, but relatively stable ranging from 213-261.Proceed with 120 mg subcutaneous lanreotide today. Return to clinic in 4 and 8 weeks for laboratory work and treatment only. Patient will then return to clinic in 12 weeks for further evaluation and continuation of treatment. Plan to reimage in approximately 6 months. 2. Left mainstem endobronchial mass: Resolved. Consistent with neuroendocrine tumor. Patient underwent his third and final bronchoscopy with therapeutic cryoablation ablation on November 26, 2019.  Follow-up with pulmonology as indicated.   3.  Covid: Resolved.  4.  Urinary retention: Resolved. Foley has been removed.    He saw cardiology again 12/19/2019 with the following assessment/plan noted:  1.  HTN/Orthostatic hypotension -  Improvement in orthostasis symptoms since reduction dose of Losartan. Encouraged adequate hydration, slow position changes. Recent echo with normal LVEF and no significant valvular abnormalities. Continue Losartan at present dose - he has been taking 1.25 tablets of his 48m tablet. BP goal of <140/90. May be able to further reduce Losartan to 579mdaily. He will monitor BP at home.   2. Paroxysmal atrial flutter - Noted in 2014. Previous atrial flutter in setting of lung cancer and recent diagnosis of neuroendocrine tumor. Recent 14 day ZIO with no evidence of recurrence.   3. Lung cancer - Continue to follow with pulmonology and oncology.   4. Coronary artery calcification on CT/Aortic atherosclerosis - Stable finding. No anginal symptoms. EKG today with no acute ST/T wave changes. No indication for ischemic evaluation at this time.  5. DOE - Likely multifactorial deconditioning, lung cancer. Recent echo and ZI ounrevealing. Reports improvement in dyspnea.   Disposition: Follow up in 6 month(s) with  Dr. Rockey Malone or APP.   He also had follow-up with pulmonary involved, last in early August with another follow-up scheduled in 3 months and his Brio Ellipta continued at that visit.   Hypertension:  Currently managed on losartan -was taking 1.25 tablets of his 50 mg tablet, but recently increased back to 166m losartan about 3 weeks ago as his blood pressures were slightly higher on home checks, better since the increase Patient is taking medication regularly BP checks at home averaging in the 130's/80-82 in recent past Just saw cardiology again on follow-up with improvement in orthostasis symptoms noted on that visit with lowering the losartan, and the orthostasis symptoms remain improved even with the increase again of the losartan He is staying well-hydrated BP Readings from Last 3 Encounters:  01/23/20 132/70  01/01/20 135/72  12/19/19 (!) 150/78   Denies any CP's, palp's, increased SOB, no dizzy spells recently. No increased leg swelling, no HA's,    Hyperlipidemia: Current Medication Regimen:lipitor40 mg Taking medication regularly Lab Results  Component Value Date   CHOL 83 06/15/2019   HDL 32 (L) 06/15/2019   LDLCALC 42 06/15/2019   TRIG 44 06/15/2019   CHOLHDL 2.6 06/15/2019    Denies increased myalgias  Diabetes Mellitus Type II: Medication regimen-none,  currently managing withlifestyle he checks his blood sugars, usually run in 120's, denies any really low blood sugars., was 97 this am. Sugars go up for about a week after his monthly shots.  Denies: Increased thirst, urinates frequently at night due to prostate, although that is much improved after the procedure in early August ACEI/ARB:Yes Statin:Yes  Lab Results  Component Value Date   HGBA1C 6.6 (A) 10/25/2019   HGBA1C 6.9 (H) 07/05/2019   HGBA1C 6.8 (H) 06/14/2019   Lab Results  Component Value Date   MICROALBUR 0.6 04/11/2019   LDLCALC 42 06/15/2019   CREATININE 0.97 12/21/2019     BPH  with LUTS Medication regimen-finasteride Continues to be followed by urology, Had prostate procedure ("steaming")- August, now up only 2 times a night to urinate, off flomax now, f/u again in Oct and possibly may go off the finasteride      Patient Active Problem List   Diagnosis Date Noted  . Hyponatremia 07/03/2019  . Metabolic acidosis 024/58/0998 . BPH (benign prostatic hyperplasia) 07/03/2019  . PNA (pneumonia) 07/03/2019  . Diabetes mellitus type 2, controlled, with complications ([facility] 033/82/5053 . HLD (hyperlipidemia) 06/14/2019  . 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 Respiratory failure ([facility] 06/11/2019  . Elevated LFTs 06/11/2019  . Pneumonia due to COVID-19 virus 06/10/2019  . Contracture of palmar fascia 10/11/2018  . Microalbuminuria due to type 2 diabetes mellitus ([facility] [place] 04/12/2018  . Type 2 diabetes mellitus ([facility] 04/10/2018  . Osteopenia determined by x-ray 01/07/2017  . Neuroendocrine carcinoma of lung ([facility] 12/28/2016  . Low back pain 12/17/2016  . Liver mass, left lobe 08/28/2016  . 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 History of lung cancer 06/29/2016  . Aortic atherosclerosis ([facility] 04/06/2016  . Coronary artery disease involving native heart without angina pectoris 04/06/2016  . History of colonic polyps 10/28/2015  . Medication monitoring encounter 08/15/2015  . Paroxysmal atrial flutter ([facility]   . Elevated serum alkaline phosphatase level 03/05/2015  . Allergic rhinitis   . BPH without urinary obstruction   .  Hyperlipidemia 02/13/2014  . Orthostasis 07/18/2013  . Sinus bradycardia 07/18/2013  . S/P lobectomy of lung 06/04/2013  . Atrial flutter ([place]) 04/09/2013  . Smoking history 04/09/2013  . Essential hypertension 04/09/2013  . Rectal bleeding 12/15/2012      Current Outpatient Medications:  .  aspirin EC 81 MG tablet, Take 81 mg by mouth daily. , Disp: , Rfl:  .  atorvastatin (LIPITOR) 40 MG tablet, TAKE 1 TABLET BY  MOUTH ONCE DAILY FOR CHOLESTEROL (Patient taking differently: Take 40 mg by mouth daily. TAKE 1 TABLET BY MOUTH ONCE DAILY FOR CHOLESTEROL), Disp: 90 tablet, Rfl: 1 .  b complex vitamins capsule, Take 1 capsule by mouth daily., Disp: , Rfl:  .  cholecalciferol (VITAMIN D3) 25 MCG (1000 UNIT) tablet, Take 1,000 Units by mouth daily., Disp: , Rfl:  .  Cinnamon 500 MG capsule, Take 500 mg by mouth at bedtime. , Disp: , Rfl:  .  diphenhydrAMINE (BENADRYL) 25 mg capsule, Take 25 mg by mouth every morning. , Disp: , Rfl:  .  DM-APAP-CPM (CORICIDIN HBP PO), Take 2 tablets by mouth daily as needed (allergies/cold symptoms)., Disp: , Rfl:  .  finasteride (PROSCAR) 5 MG tablet, Take 1 tablet (5 mg total) by mouth daily., Disp: 90 tablet, Rfl: 1 .  fluticasone furoate-vilanterol (BREO ELLIPTA) 100-25 MCG/INH AEPB, Inhale 1 puff into the lungs daily., Disp: 28 each, Rfl: 11 .  Glucose Blood (BLOOD GLUCOSE TEST STRIPS) STRP, Use as directed to monitor FSBS once daily. Dx: E11.9., Disp: 50 strip, Rfl: 1 .  losartan (COZAAR) 50 MG tablet, Take 1.5 tablets (75 mg total) by mouth daily., Disp: 135 tablet, Rfl: 1 .  Multiple Vitamins-Minerals (MULTIVITAMIN WITH MINERALS) tablet, Take 1 tablet by mouth daily. , Disp: , Rfl:  .  polycarbophil (FIBERCON) 625 MG tablet, Take 625 mg by mouth 2 (two) times daily. , Disp: , Rfl:  .  vitamin C (ASCORBIC ACID) 500 MG tablet, Take 500 mg by mouth daily. , Disp: , Rfl:  .  zinc sulfate 220 (50 Zn) MG capsule, Take 1 capsule (220 mg total) by mouth daily., Disp: 30 capsule, Rfl: 0 No current facility-administered medications for this visit.  Facility-Administered Medications Ordered in Other Visits:  .  lanreotide acetate (SOMATULINE DEPOT) injection 120 mg, 120 mg, Subcutaneous, Once, Grayland Ormond, Kathlene November, MD   Allergies  Allergen Reactions  . Tape Itching    Surgical tapes      Past Surgical History:  Procedure Laterality Date  . BRONCHOSCOPY     04/06/2013  .  CARDIOVASCULAR STRESS TEST  05/2013   a. No evidence of ischemia or infarct, EF 70%, no WMAs  . CATARACT EXTRACTION W/PHACO Left 07/14/2015   Procedure: CATARACT EXTRACTION PHACO AND INTRAOCULAR LENS PLACEMENT (IOC);  Surgeon: [name], MD;  Location: [place];  Service: Ophthalmology;  Laterality: Left;  . CATARACT EXTRACTION W/PHACO Right 10/01/2015   Procedure: CATARACT EXTRACTION PHACO AND INTRAOCULAR LENS PLACEMENT (IOC);  Surgeon: [name], MD;  Location: [place];  Service: Ophthalmology;  Laterality: Right;  . COLONOSCOPY W/ POLYPECTOMY     bleed after-had to go to surgery to stop bleeding via colonoscopy  . COLONOSCOPY WITH PROPOFOL N/A 11/19/2015   Procedure: COLONOSCOPY WITH PROPOFOL;  Surgeon: [name], MD;  [facility] ENDOSCOPY;  Service: Endoscopy;  Laterality: N/A;  . DUPUYTREN CONTRACTURE RELEASE  12/15/2011   Procedure: DUPUYTREN CONTRACTURE RELEASE;  Surgeon: [name], MD;  Location: [place];  Service: Orthopedics;  Laterality: Left;  Fasciotomy left ring finger dupuytrens  . ELECTROMAGNETIC NAVIGATION BROCHOSCOPY Left 09/19/2019   Procedure: ELECTROMAGNETIC NAVIGATION BRONCHOSCOPY;  Surgeon: Tyler Pita, MD;  Location: [facility];  Service: Cardiopulmonary;  Laterality: Left;  . FASCIECTOMY Right 11/07/2018   Procedure: SEGMENTAL FASCIECTOMY RIGHT RING FINGER;  Surgeon: Daryll Brod, MD;  Location: [place];  Service: Orthopedics;  Laterality: Right;  AXILLARY BLOCK  . FLEXIBLE BRONCHOSCOPY Left 11/26/2019   Procedure: FLEXIBLE BRONCHOSCOPY WITH CRYO THERAPY;  Surgeon: Tyler Pita, MD;  Location: [facility];  Service: Cardiopulmonary;  Laterality: Left;  . IR ANGIOGRAM SELECTIVE EACH ADDITIONAL VESSEL  07/26/2017  . IR ANGIOGRAM SELECTIVE EACH ADDITIONAL VESSEL  07/26/2017  . IR ANGIOGRAM SELECTIVE EACH ADDITIONAL VESSEL  07/26/2017  . IR ANGIOGRAM SELECTIVE EACH ADDITIONAL VESSEL   07/26/2017  . IR ANGIOGRAM SELECTIVE EACH ADDITIONAL VESSEL  07/26/2017  . IR ANGIOGRAM SELECTIVE EACH ADDITIONAL VESSEL  08/11/2017  . IR ANGIOGRAM SELECTIVE EACH ADDITIONAL VESSEL  04/28/2018  . IR ANGIOGRAM VISCERAL SELECTIVE  07/26/2017  . IR ANGIOGRAM VISCERAL SELECTIVE  08/11/2017  . IR ANGIOGRAM VISCERAL SELECTIVE  04/28/2018  . IR EMBO ARTERIAL NOT HEMORR HEMANG INC GUIDE ROADMAPPING  07/26/2017  . IR EMBO TUMOR ORGAN ISCHEMIA INFARCT INC GUIDE ROADMAPPING  08/11/2017  . IR EMBO TUMOR ORGAN ISCHEMIA INFARCT INC GUIDE ROADMAPPING  04/28/2018  . IR RADIOLOGIST EVAL & MGMT  07/07/2017  . IR RADIOLOGIST EVAL & MGMT  09/07/2017  . IR RADIOLOGIST EVAL & MGMT  12/21/2017  . IR RADIOLOGIST EVAL & MGMT  03/22/2018  . IR RADIOLOGIST EVAL & MGMT  06/08/2018  . IR RADIOLOGIST EVAL & MGMT  11/01/2018  . IR RADIOLOGIST EVAL & MGMT  03/21/2019  . IR RADIOLOGIST EVAL & MGMT  11/08/2019  . IR US GUIDE VASC ACCESS RIGHT  07/26/2017  . IR US GUIDE VASC ACCESS RIGHT  08/11/2017  . IR US GUIDE VASC ACCESS RIGHT  04/28/2018  . LUNG LOBECTOMY  06/10/13   upper left lung  . MULTIPLE TOOTH EXTRACTIONS    . REMOVAL RETAINED LENS Right 11/17/2015   Procedure: REMOVAL RETAINED LENS FROAGMENTS RIGHT EYE;  Surgeon: [name], MD;  Location: [place];  Service: Ophthalmology;  Laterality: Right;  . TONSILLECTOMY    . UPPER GASTROINTESTINAL ENDOSCOPY  11-03-15   Dr [name]  . VASECTOMY    . VIDEO ASSISTED THORACOSCOPY (VATS)/WEDGE RESECTION Left 06/04/2013   Procedure: VIDEO ASSISTED THORACOSCOPY (VATS)/WEDGE RESECTION;  Surgeon: [name], MD;  Location: [place];  Service: Thoracic;  Laterality: Left;  [name] VIDEO BRONCHOSCOPY N/A 06/04/2013   Procedure: VIDEO BRONCHOSCOPY;  Surgeon: [name], MD;  Location: [facility] OR;  Service: Thoracic;  Laterality: N/A;     Family History  Problem Relation Age of Onset  . Stroke Mother   . Alzheimer's disease Mother   . Hypertension Sister   . Hyperlipidemia  Sister   . Hypertension Brother   . Hyperlipidemia Brother   . Diabetes Brother      Social History   Tobacco Use  . Smoking status: Former Smoker    Packs/day: 1.00    Years: 40.00    Pack years: 40.00    Types: Cigarettes    Quit date: 12/09/1998    Years since quitting: 21.1  . Smokeless tobacco: Never Used  Substance Use Topics  . Alcohol use: No    With staff assistance, above reviewed with the patient  today.  ROS: As  per HPI, question of slight hearing deficit in the recent past, noted by the wife in the room as a concern, otherwise no specific complaints on a limited and focused system review   No results found for this or any previous visit (from the past 72 hour(s)).   PHQ2/9: Depression screen [facility] 2/9 01/23/2020 10/25/2019 07/10/2019 06/07/2019 04/11/2019  Decreased Interest 0 0 0 0 0  Down, Depressed, Hopeless 0 0 0 0 0  PHQ - 2 Score 0 0 0 0 0  Altered sleeping - 0 2 0 0  Tired, decreased energy - 3 3 0 0  Change in appetite - 0 0 0 0  Feeling bad or failure about yourself  - 0 0 0 0  Trouble concentrating - 0 0 0 0  Moving slowly or fidgety/restless - 0 0 0 0  Suicidal thoughts - 0 0 0 0  PHQ-9 Score - 3 5 0 0  Difficult doing work/chores - Not difficult at all Not difficult at all - Not difficult at all  Some recent data might be hidden   PHQ-2/9 Result is neg  Fall Risk: Fall Risk  01/23/2020 10/25/2019 07/10/2019 06/07/2019 04/11/2019  Falls in the past year? 0 - 0 0 0  Number falls in past yr: 0 0 0 0 0  Injury with Fall? 0 0 0 0 0  Comment - - - - -  Follow up Falls evaluation completed - - - -      Objective:   Vitals:   01/23/20 0946  BP: 132/70  Pulse: 70  Resp: 16  Temp: 97.7 F (36.5 C)  TempSrc: Oral  SpO2: 99%  Weight: 153 lb 4.8 oz (69.5 kg)  Height: _0  (1.626 m)    Body mass index is 26.31 kg/m.  Physical Exam   NAD, masked, pleasant, looks well HEENT - [place]/AT, sclera anicteric, PERRL, EOMI, conj - non-inj'ed, TMs and canals  clear with just some mild cerumen noted in the right ear canal that was not impacted, pharynx clear Neck - supple, no adenopathy, carotids 2+ and = without bruits bilat Car -regular rate and rhythm with a rare ectopic beat heard,  without m/g/r Pulm- CTA without wheeze or rales Abd - soft, NT diffusely, ND, Back - no CVA tenderness Ext - no LE edema,  Neuro/psychiatric - affect was not flat, appropriate with conversation      Alert      Grossly non-focal - good strength on testing extremities, sensation intact to LT in distal extremities      Speech normal   Results for orders placed or performed in visit on 12/21/19  Chromogranin A  Result Value Ref Range   Chromogranin A (ng/mL) 300.6 (H) 0.0 - 101.8 ng/mL  Comprehensive metabolic panel  Result Value Ref Range   Sodium 141 135 - 145 mmol/L   Potassium 4.8 3.5 - 5.1 mmol/L   Chloride 104 98 - 111 mmol/L   CO2 26 22 - 32 mmol/L   Glucose, Bld 105 (H) 70 - 99 mg/dL   BUN 21 8 - 23 mg/dL   Creatinine, Ser 0.97 0.61 - 1.24 mg/dL   Calcium 9.8 8.9 - 10.3 mg/dL   Total Protein 7.2 6.5 - 8.1 g/dL   Albumin 4.2 3.5 - 5.0 g/dL   AST 27 15 - 41 U/L   ALT 25 0 - 44 U/L   Alkaline Phosphatase 137 (H) 38 - 126 U/L   Total Bilirubin 0.9 0.3 - 1.2 mg/dL  GFR calc non Af Amer >60 >60 mL/min   GFR calc Af Amer >60 >60 mL/min   Anion gap 11 5 - 15  CBC with Differential/Platelet  Result Value Ref Range   WBC 9.3 4.0 - 10.5 K/uL   RBC 5.40 4.22 - 5.81 MIL/uL   Hemoglobin 15.9 13.0 - 17.0 g/dL   HCT 47.0 39 - 52 %   MCV 87.0 80.0 - 100.0 fL   MCH 29.4 26.0 - 34.0 pg   MCHC 33.8 30.0 - 36.0 g/dL   RDW 14.7 11.5 - 15.5 %   Platelets 171 150 - 400 K/uL   nRBC 0.0 0.0 - 0.2 %   Neutrophils Relative % 71 %   Neutro Abs 6.6 1.7 - 7.7 K/uL   Lymphocytes Relative 11 %   Lymphs Abs 1.1 0.7 - 4.0 K/uL   Monocytes Relative 16 %   Monocytes Absolute 1.5 (H) 0 - 1 K/uL   Eosinophils Relative 1 %   Eosinophils Absolute 0.1 0 - 0 K/uL    Basophils Relative 1 %   Basophils Absolute 0.1 0 - 0 K/uL   Immature Granulocytes 0 %   Abs Immature Granulocytes 0.04 0.00 - 0.07 K/uL   Last labs reviewed A1c - 6.2 today    Assessment & Plan:  1. Need for influenza vaccination  - Flu Vaccine QUAD High Dose(Fluad)  2. Essential hypertension Blood pressure remains controlled with the losartan dose recently increased by the patient to 100 mg daily again.  He continues to monitor these at home. Cardiology has been involved in helping with blood pressure management as well. We will continue the losartan presently and continue with blood pressure checks as he is doing.  3. Orthostasis He notes he has not had orthostatic symptoms in many weeks now, even with the losartan dose increased again to 100 mg. Continue to stay well hydrated recommended Continue to monitor.   4. Type 2 diabetes mellitus without complication, without long-term current use of insulin (HCC) A1c today in the office remains good at 6.2 Continues without medications to manage presently He will continue to check blood sugars at home Continues a statin presently  5. Mixed hyperlipidemia Remains on a statin with last lipid check good. Continue the statin presently.  6. Benign prostatic hyperplasia with urinary hesitancy Followed by urology, with symptoms improved with the recent procedure in early August noted. Continue to follow-up with urology as planned  7. Neuroendocrine carcinoma of lung ([place]) Continues to follow with oncology  8. Decreased hearing, unspecified laterality Agreed to continue to monitor presently, although if this is more problematic in the near future, may need to have a hearing test done, possibly evaluated for hearing aids if felt would be helpful. No big obstructive cerumen in the canal today noted.    Jason Malkin, MD 01/23/20 10:00 AM

## 2020-01-23 ENCOUNTER — Ambulatory Visit (INDEPENDENT_AMBULATORY_CARE_PROVIDER_SITE_OTHER): Payer: Medicare Other | Admitting: Internal Medicine

## 2020-01-23 ENCOUNTER — Other Ambulatory Visit: Payer: Self-pay

## 2020-01-23 ENCOUNTER — Encounter: Payer: Self-pay | Admitting: Internal Medicine

## 2020-01-23 VITALS — BP 132/70 | HR 70 | Temp 97.7°F | Resp 16 | Ht 64.0 in | Wt 153.3 lb

## 2020-01-23 DIAGNOSIS — H9190 Unspecified hearing loss, unspecified ear: Secondary | ICD-10-CM

## 2020-01-23 DIAGNOSIS — C7A8 Other malignant neuroendocrine tumors: Secondary | ICD-10-CM

## 2020-01-23 DIAGNOSIS — I10 Essential (primary) hypertension: Secondary | ICD-10-CM

## 2020-01-23 DIAGNOSIS — R3911 Hesitancy of micturition: Secondary | ICD-10-CM

## 2020-01-23 DIAGNOSIS — E119 Type 2 diabetes mellitus without complications: Secondary | ICD-10-CM | POA: Diagnosis not present

## 2020-01-23 DIAGNOSIS — Z23 Encounter for immunization: Secondary | ICD-10-CM

## 2020-01-23 DIAGNOSIS — I951 Orthostatic hypotension: Secondary | ICD-10-CM

## 2020-01-23 DIAGNOSIS — E782 Mixed hyperlipidemia: Secondary | ICD-10-CM | POA: Diagnosis not present

## 2020-01-23 DIAGNOSIS — N401 Enlarged prostate with lower urinary tract symptoms: Secondary | ICD-10-CM

## 2020-01-24 ENCOUNTER — Ambulatory Visit: Payer: Medicare Other | Admitting: Internal Medicine

## 2020-01-29 ENCOUNTER — Inpatient Hospital Stay: Payer: Medicare Other | Attending: Oncology

## 2020-01-29 ENCOUNTER — Inpatient Hospital Stay: Payer: Medicare Other

## 2020-01-29 ENCOUNTER — Other Ambulatory Visit: Payer: Self-pay

## 2020-01-29 DIAGNOSIS — N401 Enlarged prostate with lower urinary tract symptoms: Secondary | ICD-10-CM | POA: Insufficient documentation

## 2020-01-29 DIAGNOSIS — Z79899 Other long term (current) drug therapy: Secondary | ICD-10-CM | POA: Insufficient documentation

## 2020-01-29 DIAGNOSIS — C787 Secondary malignant neoplasm of liver and intrahepatic bile duct: Secondary | ICD-10-CM | POA: Diagnosis not present

## 2020-01-29 DIAGNOSIS — R338 Other retention of urine: Secondary | ICD-10-CM | POA: Diagnosis not present

## 2020-01-29 DIAGNOSIS — C7B8 Other secondary neuroendocrine tumors: Secondary | ICD-10-CM

## 2020-01-29 DIAGNOSIS — C7A8 Other malignant neuroendocrine tumors: Secondary | ICD-10-CM | POA: Diagnosis present

## 2020-01-29 LAB — CBC WITH DIFFERENTIAL/PLATELET
Abs Immature Granulocytes: 0.06 10*3/uL (ref 0.00–0.07)
Basophils Absolute: 0.1 10*3/uL (ref 0.0–0.1)
Basophils Relative: 1 %
Eosinophils Absolute: 0.1 10*3/uL (ref 0.0–0.5)
Eosinophils Relative: 1 %
HCT: 45 % (ref 39.0–52.0)
Hemoglobin: 15.1 g/dL (ref 13.0–17.0)
Immature Granulocytes: 1 %
Lymphocytes Relative: 11 %
Lymphs Abs: 1 10*3/uL (ref 0.7–4.0)
MCH: 29.8 pg (ref 26.0–34.0)
MCHC: 33.6 g/dL (ref 30.0–36.0)
MCV: 88.9 fL (ref 80.0–100.0)
Monocytes Absolute: 1.2 10*3/uL — ABNORMAL HIGH (ref 0.1–1.0)
Monocytes Relative: 13 %
Neutro Abs: 6.7 10*3/uL (ref 1.7–7.7)
Neutrophils Relative %: 73 %
Platelets: 172 10*3/uL (ref 150–400)
RBC: 5.06 MIL/uL (ref 4.22–5.81)
RDW: 15 % (ref 11.5–15.5)
WBC: 9.2 10*3/uL (ref 4.0–10.5)
nRBC: 0 % (ref 0.0–0.2)

## 2020-01-29 LAB — COMPREHENSIVE METABOLIC PANEL
ALT: 28 U/L (ref 0–44)
AST: 30 U/L (ref 15–41)
Albumin: 4 g/dL (ref 3.5–5.0)
Alkaline Phosphatase: 119 U/L (ref 38–126)
Anion gap: 8 (ref 5–15)
BUN: 18 mg/dL (ref 8–23)
CO2: 27 mmol/L (ref 22–32)
Calcium: 9.3 mg/dL (ref 8.9–10.3)
Chloride: 105 mmol/L (ref 98–111)
Creatinine, Ser: 1 mg/dL (ref 0.61–1.24)
GFR calc Af Amer: >60 mL/min (ref >60)
GFR calc non Af Amer: >60 mL/min (ref >60)
Glucose, Bld: 122 mg/dL — ABNORMAL HIGH (ref 70–99)
Potassium: 4.3 mmol/L (ref 3.5–5.1)
Sodium: 140 mmol/L (ref 135–145)
Total Bilirubin: 0.9 mg/dL (ref 0.3–1.2)
Total Protein: 6.8 g/dL (ref 6.5–8.1)

## 2020-01-29 MED ORDER — LANREOTIDE ACETATE 120 MG/0.5ML ~~LOC~~ SOLN
120.0000 mg | Freq: Once | SUBCUTANEOUS | Status: AC
  Administered 2020-01-29: 120 mg via SUBCUTANEOUS
  Filled 2020-01-29: qty 120

## 2020-02-01 LAB — CHROMOGRANIN A: Chromogranin A (ng/mL): 349.5 ng/mL — ABNORMAL HIGH (ref 0.0–101.8)

## 2020-02-03 ENCOUNTER — Other Ambulatory Visit: Payer: Self-pay | Admitting: Family Medicine

## 2020-02-03 DIAGNOSIS — E119 Type 2 diabetes mellitus without complications: Secondary | ICD-10-CM

## 2020-02-26 ENCOUNTER — Telehealth: Payer: Self-pay | Admitting: Internal Medicine

## 2020-02-26 ENCOUNTER — Inpatient Hospital Stay: Payer: Medicare Other | Attending: Oncology

## 2020-02-26 ENCOUNTER — Other Ambulatory Visit: Payer: Self-pay

## 2020-02-26 ENCOUNTER — Inpatient Hospital Stay: Payer: Medicare Other

## 2020-02-26 DIAGNOSIS — C7B02 Secondary carcinoid tumors of liver: Secondary | ICD-10-CM | POA: Insufficient documentation

## 2020-02-26 DIAGNOSIS — C7A090 Malignant carcinoid tumor of the bronchus and lung: Secondary | ICD-10-CM | POA: Diagnosis not present

## 2020-02-26 DIAGNOSIS — C7A8 Other malignant neuroendocrine tumors: Secondary | ICD-10-CM

## 2020-02-26 LAB — CBC WITH DIFFERENTIAL/PLATELET
Abs Immature Granulocytes: 0.04 10*3/uL (ref 0.00–0.07)
Basophils Absolute: 0.1 10*3/uL (ref 0.0–0.1)
Basophils Relative: 1 %
Eosinophils Absolute: 0.1 10*3/uL (ref 0.0–0.5)
Eosinophils Relative: 1 %
HCT: 48 % (ref 39.0–52.0)
Hemoglobin: 16.1 g/dL (ref 13.0–17.0)
Immature Granulocytes: 0 %
Lymphocytes Relative: 10 %
Lymphs Abs: 1 10*3/uL (ref 0.7–4.0)
MCH: 30.6 pg (ref 26.0–34.0)
MCHC: 33.5 g/dL (ref 30.0–36.0)
MCV: 91.3 fL (ref 80.0–100.0)
Monocytes Absolute: 1.4 10*3/uL — ABNORMAL HIGH (ref 0.1–1.0)
Monocytes Relative: 14 %
Neutro Abs: 7.2 10*3/uL (ref 1.7–7.7)
Neutrophils Relative %: 74 %
Platelets: 176 10*3/uL (ref 150–400)
RBC: 5.26 MIL/uL (ref 4.22–5.81)
RDW: 14.3 % (ref 11.5–15.5)
WBC: 9.9 10*3/uL (ref 4.0–10.5)
nRBC: 0 % (ref 0.0–0.2)

## 2020-02-26 LAB — COMPREHENSIVE METABOLIC PANEL
ALT: 30 U/L (ref 0–44)
AST: 26 U/L (ref 15–41)
Albumin: 4.2 g/dL (ref 3.5–5.0)
Alkaline Phosphatase: 131 U/L — ABNORMAL HIGH (ref 38–126)
Anion gap: 8 (ref 5–15)
BUN: 22 mg/dL (ref 8–23)
CO2: 27 mmol/L (ref 22–32)
Calcium: 9.4 mg/dL (ref 8.9–10.3)
Chloride: 103 mmol/L (ref 98–111)
Creatinine, Ser: 0.88 mg/dL (ref 0.61–1.24)
GFR, Estimated: >60 mL/min (ref >60)
Glucose, Bld: 123 mg/dL — ABNORMAL HIGH (ref 70–99)
Potassium: 4.7 mmol/L (ref 3.5–5.1)
Sodium: 138 mmol/L (ref 135–145)
Total Bilirubin: 0.8 mg/dL (ref 0.3–1.2)
Total Protein: 7.2 g/dL (ref 6.5–8.1)

## 2020-02-26 MED ORDER — LANREOTIDE ACETATE 120 MG/0.5ML ~~LOC~~ SOLN
120.0000 mg | Freq: Once | SUBCUTANEOUS | Status: AC
  Administered 2020-02-26: 120 mg via SUBCUTANEOUS
  Filled 2020-02-26: qty 120

## 2020-02-26 NOTE — Telephone Encounter (Signed)
Patient declined the Medicare Wellness Visit with [facility]. No reason given

## 2020-02-28 LAB — CHROMOGRANIN A: Chromogranin A (ng/mL): 408.8 ng/mL — ABNORMAL HIGH (ref 0.0–101.8)

## 2020-03-25 ENCOUNTER — Inpatient Hospital Stay (HOSPITAL_BASED_OUTPATIENT_CLINIC_OR_DEPARTMENT_OTHER): Payer: Medicare Other | Admitting: Oncology

## 2020-03-25 ENCOUNTER — Inpatient Hospital Stay: Payer: Medicare Other | Attending: Oncology

## 2020-03-25 ENCOUNTER — Other Ambulatory Visit: Payer: Self-pay | Admitting: *Deleted

## 2020-03-25 ENCOUNTER — Inpatient Hospital Stay: Payer: Medicare Other

## 2020-03-25 ENCOUNTER — Encounter: Payer: Self-pay | Admitting: Oncology

## 2020-03-25 VITALS — BP 136/85 | HR 56 | Temp 97.6°F | Resp 20 | Wt 156.0 lb

## 2020-03-25 DIAGNOSIS — Z79899 Other long term (current) drug therapy: Secondary | ICD-10-CM | POA: Insufficient documentation

## 2020-03-25 DIAGNOSIS — C7A8 Other malignant neuroendocrine tumors: Secondary | ICD-10-CM | POA: Diagnosis not present

## 2020-03-25 DIAGNOSIS — C7B8 Other secondary neuroendocrine tumors: Secondary | ICD-10-CM

## 2020-03-25 DIAGNOSIS — C7B02 Secondary carcinoid tumors of liver: Secondary | ICD-10-CM | POA: Diagnosis present

## 2020-03-25 DIAGNOSIS — C7A090 Malignant carcinoid tumor of the bronchus and lung: Secondary | ICD-10-CM | POA: Insufficient documentation

## 2020-03-25 LAB — CBC WITH DIFFERENTIAL/PLATELET
Abs Immature Granulocytes: 0.04 10*3/uL (ref 0.00–0.07)
Basophils Absolute: 0.1 10*3/uL (ref 0.0–0.1)
Basophils Relative: 1 %
Eosinophils Absolute: 0.1 10*3/uL (ref 0.0–0.5)
Eosinophils Relative: 1 %
HCT: 47.5 % (ref 39.0–52.0)
Hemoglobin: 15.9 g/dL (ref 13.0–17.0)
Immature Granulocytes: 0 %
Lymphocytes Relative: 10 %
Lymphs Abs: 1 10*3/uL (ref 0.7–4.0)
MCH: 30.4 pg (ref 26.0–34.0)
MCHC: 33.5 g/dL (ref 30.0–36.0)
MCV: 90.8 fL (ref 80.0–100.0)
Monocytes Absolute: 1.4 10*3/uL — ABNORMAL HIGH (ref 0.1–1.0)
Monocytes Relative: 14 %
Neutro Abs: 7.4 10*3/uL (ref 1.7–7.7)
Neutrophils Relative %: 74 %
Platelets: 163 10*3/uL (ref 150–400)
RBC: 5.23 MIL/uL (ref 4.22–5.81)
RDW: 13.9 % (ref 11.5–15.5)
WBC: 10 10*3/uL (ref 4.0–10.5)
nRBC: 0 % (ref 0.0–0.2)

## 2020-03-25 LAB — COMPREHENSIVE METABOLIC PANEL
ALT: 36 U/L (ref 0–44)
AST: 30 U/L (ref 15–41)
Albumin: 4.1 g/dL (ref 3.5–5.0)
Alkaline Phosphatase: 115 U/L (ref 38–126)
Anion gap: 9 (ref 5–15)
BUN: 21 mg/dL (ref 8–23)
CO2: 27 mmol/L (ref 22–32)
Calcium: 9.8 mg/dL (ref 8.9–10.3)
Chloride: 104 mmol/L (ref 98–111)
Creatinine, Ser: 1.01 mg/dL (ref 0.61–1.24)
GFR, Estimated: >60 mL/min (ref >60)
Glucose, Bld: 121 mg/dL — ABNORMAL HIGH (ref 70–99)
Potassium: 4.4 mmol/L (ref 3.5–5.1)
Sodium: 140 mmol/L (ref 135–145)
Total Bilirubin: 0.7 mg/dL (ref 0.3–1.2)
Total Protein: 7 g/dL (ref 6.5–8.1)

## 2020-03-25 MED ORDER — LANREOTIDE ACETATE 120 MG/0.5ML ~~LOC~~ SOLN
120.0000 mg | Freq: Once | SUBCUTANEOUS | Status: AC
  Administered 2020-03-25: 120 mg via SUBCUTANEOUS
  Filled 2020-03-25: qty 120

## 2020-03-25 NOTE — Progress Notes (Signed)
[place]  [phone] [phone]  ID: Jason Malone OB: January 13, 1941  MR#: [id number]  EGB#:[id number]  Patient Care Team: Towanda Malkin, MD as PCP - General (Internal Medicine) Minna Merritts, MD as PCP - Cardiology (Cardiology) [name], Forest Gleason, MD (General Surgery) Nestor Lewandowsky, MD as Referring Physician (Cardiothoracic Surgery) Minna Merritts, MD as Consulting Physician (Cardiology) [name], MD as Consulting Physician (Cardiothoracic Surgery) Lloyd Huger, MD as Consulting Physician (Oncology) Sanda Klein Satira Anis, MD as Attending Physician (Family Medicine)   CHIEF COMPLAINT: 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  INTERVAL HISTORY: [name] is a 79 year old male with past medical history significant for stage IVa neuroendocrine tumor of the lung metastatic to the liver.  He has recovered from COVID-19 where he was hospitalized for several weeks.  He continues to tolerate lanreotide well.  He currently feels well and is asymptomatic. He does not complain of pain. He denies any weakness or fatigue today.  He has no neurologic complaints.  He has a good appetite and denies weight loss.  He denies any chest pain, shortness of breath, cough, or hemoptysis.  He denies any abdominal pain.  He has no nausea, vomiting, constipation, or diarrhea.  He has no urinary complaints. Patient offers no specific complaints today.  REVIEW OF SYSTEMS:   Review of Systems  Constitutional: Negative.  Negative for fever, malaise/fatigue and weight loss.  Respiratory: Negative.  Negative for cough, hemoptysis and shortness of breath.   Cardiovascular: Negative.  Negative for chest pain and leg swelling.  Gastrointestinal: Negative.  Negative for abdominal pain, diarrhea, nausea and vomiting.  Genitourinary: Negative.  Negative for dysuria.  Musculoskeletal: Negative.  Negative for back pain.  Skin:  Negative.  Negative for rash.  Neurological: Negative.  Negative for dizziness, sensory change, focal weakness and weakness.  Psychiatric/Behavioral: Negative.  The patient is not nervous/anxious.     As per HPI. Otherwise, a complete review of systems is negative.  PAST MEDICAL HISTORY: Past Medical History:  Diagnosis Date  . Allergic rhinitis   . Benign prostatic hypertrophy with lower urinary tract symptoms (LUTS)   . Chest pain    a. 05/2013 MV: EF 70%, no ischemia.  . Diverticulitis    DIVERTICULOSIS  . Enlarged prostate   . Essential hypertension    CONTROLLED ON MEDS  . Full dentures    upper and lower  . H/O hypokalemia   . Hx of atrial fibrillation without current medication    CARDIOLOGIST-DR [facility]  . 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 Hyperlipemia   . Hypomagnesemia   . IFG (impaired fasting glucose)   . Liver cancer ([place])   . Liver cancer ([place]) 12/08/2016  . Liver mass, left lobe 08/28/2016   Probably hemangioma; will get MRI of liver, refer to GI  . Lung cancer ([place])    a. carcinoid, left lung, Stage 1b (T2a, N0, cM0);  b. 05/2013 s/p VATS & LULobectomy.  . Monocytosis   . Osteopenia   . Paroxysmal atrial flutter ([facility])    a. 03/2013->no recurrence;  b. CHA2DS2VASc = 2-->not currently on anticoagulation;  c. 05/2012 Echo: EF 55-60%, normal RV.  [name] Wears glasses     PAST SURGICAL HISTORY: Past Surgical History:  Procedure Laterality Date  . BRONCHOSCOPY     04/06/2013  . CARDIOVASCULAR STRESS TEST  05/2013   a. No evidence of ischemia or infarct, EF 70%,  no WMAs  . CATARACT EXTRACTION W/PHACO Left 07/14/2015   Procedure: CATARACT EXTRACTION PHACO AND INTRAOCULAR LENS PLACEMENT (IOC);  Surgeon: [name], MD;  Location: [place];  Service: Ophthalmology;  Laterality: Left;  . CATARACT EXTRACTION W/PHACO Right 10/01/2015   Procedure: CATARACT EXTRACTION PHACO AND INTRAOCULAR LENS PLACEMENT (IOC);  Surgeon:  [name], MD;  Location: [place];  Service: Ophthalmology;  Laterality: Right;  . COLONOSCOPY W/ POLYPECTOMY     bleed after-had to go to surgery to stop bleeding via colonoscopy  . COLONOSCOPY WITH PROPOFOL N/A 11/19/2015   Procedure: COLONOSCOPY WITH PROPOFOL;  Surgeon: [name], MD;  Location: [facility] ENDOSCOPY;  Service: Endoscopy;  Laterality: N/A;  . DUPUYTREN CONTRACTURE RELEASE  12/15/2011   Procedure: DUPUYTREN CONTRACTURE RELEASE;  Surgeon: [name], MD;  Location: [place];  Service: Orthopedics;  Laterality: Left;  Fasciotomy left ring finger dupuytrens  . ELECTROMAGNETIC NAVIGATION BROCHOSCOPY Left 09/19/2019   Procedure: ELECTROMAGNETIC NAVIGATION BRONCHOSCOPY;  Surgeon: Tyler Pita, MD;  Location: [facility];  Service: Cardiopulmonary;  Laterality: Left;  . FASCIECTOMY Right 11/07/2018   Procedure: SEGMENTAL FASCIECTOMY RIGHT RING FINGER;  Surgeon: Daryll Brod, MD;  Location: [place];  Service: Orthopedics;  Laterality: Right;  AXILLARY BLOCK  . FLEXIBLE BRONCHOSCOPY Left 11/26/2019   Procedure: FLEXIBLE BRONCHOSCOPY WITH CRYO THERAPY;  Surgeon: Tyler Pita, MD;  Location: [facility];  Service: Cardiopulmonary;  Laterality: Left;  . IR ANGIOGRAM SELECTIVE EACH ADDITIONAL VESSEL  07/26/2017  . IR ANGIOGRAM SELECTIVE EACH ADDITIONAL VESSEL  07/26/2017  . IR ANGIOGRAM SELECTIVE EACH ADDITIONAL VESSEL  07/26/2017  . IR ANGIOGRAM SELECTIVE EACH ADDITIONAL VESSEL  07/26/2017  . IR ANGIOGRAM SELECTIVE EACH ADDITIONAL VESSEL  07/26/2017  . IR ANGIOGRAM SELECTIVE EACH ADDITIONAL VESSEL  08/11/2017  . IR ANGIOGRAM SELECTIVE EACH ADDITIONAL VESSEL  04/28/2018  . IR ANGIOGRAM VISCERAL SELECTIVE  07/26/2017  . IR ANGIOGRAM VISCERAL SELECTIVE  08/11/2017  . IR ANGIOGRAM VISCERAL SELECTIVE  04/28/2018  . IR EMBO ARTERIAL NOT HEMORR HEMANG INC GUIDE ROADMAPPING  07/26/2017  . IR EMBO TUMOR ORGAN ISCHEMIA INFARCT INC GUIDE ROADMAPPING   08/11/2017  . IR EMBO TUMOR ORGAN ISCHEMIA INFARCT INC GUIDE ROADMAPPING  04/28/2018  . IR RADIOLOGIST EVAL & MGMT  07/07/2017  . IR RADIOLOGIST EVAL & MGMT  09/07/2017  . IR RADIOLOGIST EVAL & MGMT  12/21/2017  . IR RADIOLOGIST EVAL & MGMT  03/22/2018  . IR RADIOLOGIST EVAL & MGMT  06/08/2018  . IR RADIOLOGIST EVAL & MGMT  11/01/2018  . IR RADIOLOGIST EVAL & MGMT  03/21/2019  . IR RADIOLOGIST EVAL & MGMT  11/08/2019  . IR US GUIDE VASC ACCESS RIGHT  07/26/2017  . IR US GUIDE VASC ACCESS RIGHT  08/11/2017  . IR US GUIDE VASC ACCESS RIGHT  04/28/2018  . LUNG LOBECTOMY  06/10/13   upper left lung  . MULTIPLE TOOTH EXTRACTIONS    . REMOVAL RETAINED LENS Right 11/17/2015   Procedure: REMOVAL RETAINED LENS FROAGMENTS RIGHT EYE;  Surgeon: [name], MD;  Location: [place];  Service: Ophthalmology;  Laterality: Right;  . TONSILLECTOMY    . UPPER GASTROINTESTINAL ENDOSCOPY  11-03-15   Dr [name]  . VASECTOMY    . VIDEO ASSISTED THORACOSCOPY (VATS)/WEDGE RESECTION Left 06/04/2013   Procedure: VIDEO ASSISTED THORACOSCOPY (VATS)/WEDGE RESECTION;  Surgeon: [name], MD;  Location: [place];  Service: Thoracic;  Laterality: Left;  [name] VIDEO BRONCHOSCOPY N/A 06/04/2013   Procedure: VIDEO BRONCHOSCOPY;  Surgeon: [name], MD;  Location: [place];  Service: Thoracic;  Laterality: N/A;    FAMILY HISTORY: Family History  Problem Relation Age of Onset  . Stroke Mother   . Alzheimer's disease Mother   . Hypertension Sister   . Hyperlipidemia Sister   . Hypertension Brother   . Hyperlipidemia Brother   . Diabetes Brother     ADVANCED DIRECTIVES (Y/N):  N  HEALTH MAINTENANCE: Social History   Tobacco Use  . Smoking status: Former Smoker    Packs/day: 1.00    Years: 40.00    Pack years: 40.00    Types: Cigarettes    Quit date: 12/09/1998    Years since quitting: 21.3  . Smokeless tobacco: Never Used  Vaping Use  . Vaping Use: Never used  Substance Use Topics  . Alcohol  use: No  . Drug use: No     Colonoscopy:  PAP:  Bone density:  Lipid panel:  Allergies  Allergen Reactions  . Tape Itching    Surgical tapes     Current Outpatient Medications  Medication Sig Dispense Refill  . aspirin EC 81 MG tablet Take 81 mg by mouth daily.     [name] atorvastatin (LIPITOR) 40 MG tablet TAKE 1 TABLET BY MOUTH ONCE DAILY FOR CHOLESTEROL (Patient taking differently: Take 40 mg by mouth daily. TAKE 1 TABLET BY MOUTH ONCE DAILY FOR CHOLESTEROL) 90 tablet 1  . b complex vitamins capsule Take 1 capsule by mouth daily.    . cholecalciferol (VITAMIN D3) 25 MCG (1000 UNIT) tablet Take 1,000 Units by mouth daily.    . Cinnamon 500 MG capsule Take 500 mg by mouth at bedtime.     . diphenhydrAMINE (BENADRYL) 25 mg capsule Take 25 mg by mouth every morning.     [name] DM-APAP-CPM (CORICIDIN HBP PO) Take 2 tablets by mouth daily as needed (allergies/cold symptoms).    . finasteride (PROSCAR) 5 MG tablet Take 1 tablet (5 mg total) by mouth daily. 90 tablet 1  . fluticasone furoate-vilanterol (BREO ELLIPTA) 100-25 MCG/INH AEPB Inhale 1 puff into the lungs daily. 28 each 11  . losartan (COZAAR) 50 MG tablet Take 1.5 tablets (75 mg total) by mouth daily. 135 tablet 1  . Multiple Vitamins-Minerals (MULTIVITAMIN WITH MINERALS) tablet Take 1 tablet by mouth daily.     Glory Rosebush VERIO test strip USE 1 STRIP TO CHECK GLUCOSE ONCE DAILY 50 each 0  . polycarbophil (FIBERCON) 625 MG tablet Take 625 mg by mouth 2 (two) times daily.     . vitamin C (ASCORBIC ACID) 500 MG tablet Take 500 mg by mouth daily.     [name] zinc sulfate 220 (50 Zn) MG capsule Take 1 capsule (220 mg total) by mouth daily. 30 capsule 0   No current facility-administered medications for this visit.   Facility-Administered Medications Ordered in Other Visits  Medication Dose Route Frequency Provider Last Rate Last Admin  . lanreotide acetate (SOMATULINE DEPOT) injection 120 mg  120 mg Subcutaneous Once Lloyd Huger, MD       . lanreotide acetate (SOMATULINE DEPOT) injection 120 mg  120 mg Subcutaneous Once Lloyd Huger, MD        OBJECTIVE: Vitals:   03/25/20 1044  BP: 136/85  Pulse: (!) 56  Resp: 20  Temp: 97.6 F (36.4 C)     Body mass index is 26.78 kg/m.    ECOG FS:0 - Asymptomatic  General: Well-developed, well-nourished, no acute distress. HEENT: Normocephalic. Neuro: Alert,  answering all questions appropriately. Cranial nerves grossly intact. Psych: Normal affect.   LAB RESULTS:  Lab Results  Component Value Date   NA 140 03/25/2020   K 4.4 03/25/2020   CL 104 03/25/2020   CO2 27 03/25/2020   GLUCOSE 121 (H) 03/25/2020   BUN 21 03/25/2020   CREATININE 1.01 03/25/2020   CALCIUM 9.8 03/25/2020   PROT 7.0 03/25/2020   ALBUMIN 4.1 03/25/2020   AST 30 03/25/2020   ALT 36 03/25/2020   ALKPHOS 115 03/25/2020   BILITOT 0.7 03/25/2020   GFRNONAA >60 03/25/2020   GFRAA >60 01/29/2020    Lab Results  Component Value Date   WBC 10.0 03/25/2020   NEUTROABS 7.4 03/25/2020   HGB 15.9 03/25/2020   HCT 47.5 03/25/2020   MCV 90.8 03/25/2020   PLT 163 03/25/2020     STUDIES: No results found.  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  PLAN:    1. 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 Patient underwent left upper lobe lobectomy in [place] on June 04, 2013. Final pathology results revealed a well differentiated neuroendocrine tumor with positive bronchial margins. Patient did not receive adjuvant XRT or chemotherapy at that time. Patient's most recent abdominal MRI on December 21, 2019 reviewed independently and reported as above with no obvious evidence of progression or recurrent disease. His most recent Y 90 ablation occurred on April 28, 2018.  Patient's chromogranin a levels are elevated, but relatively stable ranging from  213-261. Proceed with 120 mg subcutaneous lanreotide today. Return to clinic in 4 and 8 weeks for laboratory work and treatment only. Patient will then return to clinic in 12 weeks for further evaluation and continuation of treatment.  We will plan to get CT chest abdomen pelvis in January/February 2022.  Orders placed.. 2.  Left mainstem endobronchial mass: Resolved.  Consistent with neuroendocrine tumor.  Patient underwent his third and final bronchoscopy with therapeutic cryoablation ablation on November 26, 2019.  Follow-up with pulmonology as indicated.   3.  Covid: Resolved.  4.  Urinary retention: Resolved. Foley has been removed.    Disposition: Return to clinic in 4 and 8 weeks for lanreotide and lab work only. Orders placed for CT chest abdomen pelvis in January/February 2022 prior to visit with Dr. Grayland Ormond. Return to clinic in 12 weeks for lanreotide injection, lab work and assessment and to review CT scans.  Greater than 50% was spent in counseling and coordination of care with this patient including but not limited to discussion of the relevant topics above (See A&P) including, but not limited to diagnosis and management of acute and chronic medical conditions.   Patient expressed understanding and was in agreement with this plan. He also understands that He can call clinic at any time with any questions, concerns, or complaints.   Cancer Staging Neuroendocrine carcinoma of lung [facility]) Staging form: Lung, AJCC 8th Edition - Clinical stage from 12/28/2016: Stage IVA (cT2a, cN0, cM1b) - Signed by Lloyd Huger, MD on 12/28/2016   Jacquelin Hawking, NP   03/25/2020 10:54 AM

## 2020-03-25 NOTE — Progress Notes (Signed)
Patient denies any concerns today.  

## 2020-03-27 LAB — CHROMOGRANIN A: Chromogranin A (ng/mL): 353.6 ng/mL — ABNORMAL HIGH (ref 0.0–101.8)

## 2020-04-22 ENCOUNTER — Inpatient Hospital Stay: Payer: Medicare Other | Attending: Oncology

## 2020-04-22 ENCOUNTER — Inpatient Hospital Stay: Payer: Medicare Other

## 2020-04-22 DIAGNOSIS — Z79899 Other long term (current) drug therapy: Secondary | ICD-10-CM | POA: Insufficient documentation

## 2020-04-22 DIAGNOSIS — C7B02 Secondary carcinoid tumors of liver: Secondary | ICD-10-CM | POA: Insufficient documentation

## 2020-04-22 DIAGNOSIS — C7A090 Malignant carcinoid tumor of the bronchus and lung: Secondary | ICD-10-CM | POA: Diagnosis not present

## 2020-04-22 DIAGNOSIS — C7A8 Other malignant neuroendocrine tumors: Secondary | ICD-10-CM

## 2020-04-22 LAB — COMPREHENSIVE METABOLIC PANEL
ALT: 38 U/L (ref 0–44)
AST: 28 U/L (ref 15–41)
Albumin: 4.3 g/dL (ref 3.5–5.0)
Alkaline Phosphatase: 129 U/L — ABNORMAL HIGH (ref 38–126)
Anion gap: 9 (ref 5–15)
BUN: 23 mg/dL (ref 8–23)
CO2: 28 mmol/L (ref 22–32)
Calcium: 9.9 mg/dL (ref 8.9–10.3)
Chloride: 101 mmol/L (ref 98–111)
Creatinine, Ser: 0.92 mg/dL (ref 0.61–1.24)
GFR, Estimated: >60 mL/min (ref >60)
Glucose, Bld: 110 mg/dL — ABNORMAL HIGH (ref 70–99)
Potassium: 4.8 mmol/L (ref 3.5–5.1)
Sodium: 138 mmol/L (ref 135–145)
Total Bilirubin: 0.8 mg/dL (ref 0.3–1.2)
Total Protein: 7.2 g/dL (ref 6.5–8.1)

## 2020-04-22 LAB — CBC WITH DIFFERENTIAL/PLATELET
Abs Immature Granulocytes: 0.06 10*3/uL (ref 0.00–0.07)
Basophils Absolute: 0.1 10*3/uL (ref 0.0–0.1)
Basophils Relative: 1 %
Eosinophils Absolute: 0.1 10*3/uL (ref 0.0–0.5)
Eosinophils Relative: 1 %
HCT: 48.6 % (ref 39.0–52.0)
Hemoglobin: 16 g/dL (ref 13.0–17.0)
Immature Granulocytes: 1 %
Lymphocytes Relative: 11 %
Lymphs Abs: 1.3 10*3/uL (ref 0.7–4.0)
MCH: 30.2 pg (ref 26.0–34.0)
MCHC: 32.9 g/dL (ref 30.0–36.0)
MCV: 91.7 fL (ref 80.0–100.0)
Monocytes Absolute: 1.3 10*3/uL — ABNORMAL HIGH (ref 0.1–1.0)
Monocytes Relative: 12 %
Neutro Abs: 8.5 10*3/uL — ABNORMAL HIGH (ref 1.7–7.7)
Neutrophils Relative %: 74 %
Platelets: 191 10*3/uL (ref 150–400)
RBC: 5.3 MIL/uL (ref 4.22–5.81)
RDW: 13.9 % (ref 11.5–15.5)
WBC: 11.4 10*3/uL — ABNORMAL HIGH (ref 4.0–10.5)
nRBC: 0 % (ref 0.0–0.2)

## 2020-04-22 MED ORDER — LANREOTIDE ACETATE 120 MG/0.5ML ~~LOC~~ SOLN
120.0000 mg | Freq: Once | SUBCUTANEOUS | Status: AC
  Administered 2020-04-22: 11:00:00 120 mg via SUBCUTANEOUS
  Filled 2020-04-22: qty 120

## 2020-04-23 LAB — CHROMOGRANIN A: Chromogranin A (ng/mL): 380.8 ng/mL — ABNORMAL HIGH (ref 0.0–101.8)

## 2020-04-28 ENCOUNTER — Ambulatory Visit: Payer: Medicare Other | Admitting: Pulmonary Disease

## 2020-04-30 ENCOUNTER — Ambulatory Visit: Payer: Medicare Other | Admitting: Acute Care

## 2020-05-12 ENCOUNTER — Encounter: Payer: Self-pay | Admitting: Pulmonary Disease

## 2020-05-12 ENCOUNTER — Other Ambulatory Visit: Payer: Self-pay

## 2020-05-12 ENCOUNTER — Ambulatory Visit: Payer: Medicare Other | Admitting: Pulmonary Disease

## 2020-05-12 ENCOUNTER — Other Ambulatory Visit: Payer: Self-pay | Admitting: Internal Medicine

## 2020-05-12 VITALS — BP 130/78 | HR 58 | Temp 97.3°F | Ht 64.0 in | Wt 157.6 lb

## 2020-05-12 DIAGNOSIS — C7A8 Other malignant neuroendocrine tumors: Secondary | ICD-10-CM

## 2020-05-12 DIAGNOSIS — Z8616 Personal history of COVID-19: Secondary | ICD-10-CM | POA: Diagnosis not present

## 2020-05-12 DIAGNOSIS — E119 Type 2 diabetes mellitus without complications: Secondary | ICD-10-CM

## 2020-05-12 DIAGNOSIS — J449 Chronic obstructive pulmonary disease, unspecified: Secondary | ICD-10-CM

## 2020-05-12 DIAGNOSIS — E782 Mixed hyperlipidemia: Secondary | ICD-10-CM

## 2020-05-12 DIAGNOSIS — J9611 Chronic respiratory failure with hypoxia: Secondary | ICD-10-CM | POA: Insufficient documentation

## 2020-05-12 DIAGNOSIS — J8410 Pulmonary fibrosis, unspecified: Secondary | ICD-10-CM | POA: Diagnosis not present

## 2020-05-12 NOTE — Assessment & Plan Note (Signed)
Plan: We will continue clinically monitor

## 2020-05-12 NOTE — Assessment & Plan Note (Signed)
Plan: Continue follow-up with oncology

## 2020-05-12 NOTE — Assessment & Plan Note (Signed)
Okay to stop Memory Dance  We will continue clinically monitor symptoms

## 2020-05-12 NOTE — Assessment & Plan Note (Signed)
Plan: We will continue clinically monitor Up-to-date with COVID-19 vaccinations

## 2020-05-12 NOTE — Progress Notes (Signed)
@Patient  ID: Jason Malone, male    DOB: 06/25/1940, 80 y.o.   MRN: [id number]  Chief Complaint  Patient presents with  . Follow-up    C/o sob with exertion and occ dry cough.     Referring provider: Towanda Malkin*  HPI:  80 year old male former smoker followed in our office for neuroendocrine carcinoma of lung, history of COVID-19 infection  PMH: History of COVID-19, BPH, aortic arthrosclerosis, history of atrial flutter, type 2 diabetes, hyperlipidemia Smoker/ Smoking History: Former smoker.  40-pack-year smoking history.  Quit August/2000 Maintenance: Breo Ellipta 100 Pt of: Dr. Patsey Berthold  05/12/2020  - Visit   80 year old male former smoker followed in our office by Dr. Patsey Berthold.  Patient was last seen in August/2021.  Plan of care at that office visit was for him to remain on [place] and follow-up in 3 months.  Patient also remains established with oncology.  Last office visit with oncology was in November/2021.  An excerpt of that summary is listed below:   ASSESSMENT: 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  PLAN:    1. 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 Patient underwent left upper lobe lobectomy in [place] on June 04, 2013. Final pathology results revealed a well differentiated neuroendocrine tumor with positive bronchial margins. Patient did not receive adjuvant XRT or chemotherapy at that time. Patient's most recent abdominal MRI on December 21, 2019 reviewed independently and reported as above with no obvious evidence of progression or recurrent disease. His most recent Y 90 ablation occurred on April 28, 2018.  Patient's chromogranin a levels are elevated, but relatively stable ranging from 213-261. Proceed with 120 mg subcutaneous lanreotide today. Return to clinic in 4 and 8 weeks for laboratory work and treatment  only. Patient will then return to clinic in 12 weeks for further evaluation and continuation of treatment.  We will plan to get CT chest abdomen pelvis in January/February 2022.  Orders placed.. 2.  Left mainstem endobronchial mass: Resolved.  Consistent with neuroendocrine tumor.  Patient underwent his third and final bronchoscopy with therapeutic cryoablation ablation on November 26, 2019.  Follow-up with pulmonology as indicated.   3.  Covid: Resolved.  4.  Urinary retention: Resolved. Foley has been removed.    Disposition: Return to clinic in 4 and 8 weeks for lanreotide and lab work only. Orders placed for CT chest abdomen pelvis in January/February 2022 prior to visit with Dr. Grayland Ormond. Return to clinic in 12 weeks for lanreotide injection, lab work and assessment and to review CT scans.  Patient presenting today as a 10-month follow-up visit.  Overall he reports he is doing well.  Plans on seeing oncology next week.  He remains physically active.  He reports he walks on his treadmill 10 minutes in the morning and 20 minutes at night.  He is able to tolerate this without any dyspnea.  He does not believe that the [facility] helps him with his breathing.  Overall he feels that the [facility] actually worsens his cough.  He is unsure if he actually needs this.  We will discuss this today.  He is up-to-date with his COVID-19 vaccinations as well as seasonal flu vaccine.  Questionaires / Pulmonary Flowsheets:   ACT:  No flowsheet data found.  MMRC: No flowsheet data found.  Epworth:  No flowsheet data found.  Tests:   11/02/2019-CT chest with  contrast-resolution of previously bilobate lesions of the left mainstem bronchus with only minimal residual wall irregularity, stable appearance of known metastatic disease to liver, emphysema, scattered scarring with subpleural reticulation especially in the right lung, moderately improved from prior  FENO:  No results found for:  NITRICOXIDE  PFT: No flowsheet data found.  WALK:  No flowsheet data found.  Imaging: No results found.  Lab Results:  CBC    Component Value Date/Time   WBC 11.4 (H) 04/22/2020 1042   RBC 5.30 04/22/2020 1042   HGB 16.0 04/22/2020 1042   HGB 14.5 12/17/2016 1423   HCT 48.6 04/22/2020 1042   HCT 44.1 12/17/2016 1423   PLT 191 04/22/2020 1042   PLT 218 12/17/2016 1423   MCV 91.7 04/22/2020 1042   MCV 87 12/17/2016 1423   MCV 89 03/30/2013 0519   MCH 30.2 04/22/2020 1042   MCHC 32.9 04/22/2020 1042   RDW 13.9 04/22/2020 1042   RDW 14.1 12/17/2016 1423   RDW 13.6 03/30/2013 0519   LYMPHSABS 1.3 04/22/2020 1042   LYMPHSABS 1.1 12/17/2016 1423   LYMPHSABS 1.2 03/30/2013 0519   MONOABS 1.3 (H) 04/22/2020 1042   MONOABS 1.7 (H) 03/30/2013 0519   EOSABS 0.1 04/22/2020 1042   EOSABS 0.2 12/17/2016 1423   EOSABS 0.2 03/30/2013 0519   BASOSABS 0.1 04/22/2020 1042   BASOSABS 0.1 12/17/2016 1423   BASOSABS 0.1 03/30/2013 0519    BMET    Component Value Date/Time   NA 138 04/22/2020 1042   NA 143 12/17/2016 1423   NA 139 03/30/2013 0519   K 4.8 04/22/2020 1042   K 3.4 (L) 03/30/2013 0519   CL 101 04/22/2020 1042   CL 108 (H) 03/30/2013 0519   CO2 28 04/22/2020 1042   CO2 24 03/30/2013 0519   GLUCOSE 110 (H) 04/22/2020 1042   GLUCOSE 119 (H) 03/30/2013 0519   BUN 23 04/22/2020 1042   BUN 17 12/17/2016 1423   BUN 13 03/30/2013 0519   CREATININE 0.92 04/22/2020 1042   CREATININE 0.88 02/12/2016 1103   CALCIUM 9.9 04/22/2020 1042   CALCIUM 8.8 03/30/2013 0519   GFRNONAA >60 04/22/2020 1042   GFRNONAA 85 02/12/2016 1103   GFRAA >60 01/29/2020 1106   GFRAA >89 02/12/2016 1103    BNP    Component Value Date/Time   BNP 158.7 (H) 07/06/2019 0239    ProBNP No results found for: PROBNP  Specialty Problems      Pulmonary Problems   Allergic rhinitis   Neuroendocrine carcinoma of lung ([place])   Pneumonia due to COVID-19 virus   Respiratory failure (HCC)    PNA (pneumonia)   Chronic respiratory failure with hypoxia (HCC)   COPD mixed type (HCC)   Postinflammatory pulmonary fibrosis (HCC)      Allergies  Allergen Reactions  . Tape Itching    Surgical tapes     Immunization History  Administered Date(s) Administered  . Fluad Quad(high Dose 65+) 01/24/2019, 01/23/2020  . Influenza, High Dose Seasonal PF 02/12/2016, 02/14/2017, 01/17/2018  . Influenza,inj,Quad PF,6+ Mos 02/14/2015  . PFIZER SARS-COV-2 Vaccination 05/24/2019, 10/02/2019, 04/14/2020  . Pneumococcal Conjugate-13 02/01/2014  . Pneumococcal Polysaccharide-23 05/10/2008    Past Medical History:  Diagnosis Date  . Allergic rhinitis   . Benign prostatic hypertrophy with lower urinary tract symptoms (LUTS)   . Chest pain    a. 05/2013 MV: EF 70%, no ischemia.  . Diverticulitis    DIVERTICULOSIS  . Enlarged prostate   . Essential hypertension  CONTROLLED ON MEDS  . Full dentures    upper and lower  . H/O hypokalemia   . Hx of atrial fibrillation without current medication    CARDIOLOGIST-DR [facility]  . 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 Hyperlipemia   . Hypomagnesemia   . IFG (impaired fasting glucose)   . Liver cancer ([place])   . Liver cancer ([place]) 12/08/2016  . Liver mass, left lobe 08/28/2016   Probably hemangioma; will get MRI of liver, refer to GI  . Lung cancer ([place])    a. carcinoid, left lung, Stage 1b (T2a, N0, cM0);  b. 05/2013 s/p VATS & LULobectomy.  . Monocytosis   . Osteopenia   . Paroxysmal atrial flutter (HCC)    a. 03/2013->no recurrence;  b. CHA2DS2VASc = 2-->not currently on anticoagulation;  c. 05/2012 Echo: EF 55-60%, normal RV.  [name] Wears glasses     Tobacco History: Social History   Tobacco Use  Smoking Status Former Smoker  . Packs/day: 1.00  . Years: 40.00  . Pack years: 40.00  . Types: Cigarettes  . Quit date: 12/09/1998  . Years since quitting: 21.4  Smokeless Tobacco Never Used   Counseling  given: Not Answered   Continue to not smoke  Outpatient Encounter Medications as of 05/12/2020  Medication Sig  . aspirin EC 81 MG tablet Take 81 mg by mouth daily.   [name] atorvastatin (LIPITOR) 40 MG tablet TAKE 1 TABLET BY MOUTH ONCE DAILY FOR CHOLESTEROL (Patient taking differently: Take 40 mg by mouth daily. TAKE 1 TABLET BY MOUTH ONCE DAILY FOR CHOLESTEROL)  . b complex vitamins capsule Take 1 capsule by mouth daily.  . cholecalciferol (VITAMIN D3) 25 MCG (1000 UNIT) tablet Take 1,000 Units by mouth daily.  . Cinnamon 500 MG capsule Take 500 mg by mouth at bedtime.   . diphenhydrAMINE (BENADRYL) 25 mg capsule Take 25 mg by mouth every morning.   [name] DM-APAP-CPM (CORICIDIN HBP PO) Take 2 tablets by mouth daily as needed (allergies/cold symptoms).  . finasteride (PROSCAR) 5 MG tablet Take 1 tablet (5 mg total) by mouth daily.  . fluticasone furoate-vilanterol (BREO ELLIPTA) 100-25 MCG/INH AEPB Inhale 1 puff into the lungs daily.  [name] losartan (COZAAR) 50 MG tablet Take 1.5 tablets (75 mg total) by mouth daily.  . Multiple Vitamins-Minerals (MULTIVITAMIN WITH MINERALS) tablet Take 1 tablet by mouth daily.   Glory Rosebush VERIO test strip USE 1 STRIP TO CHECK GLUCOSE ONCE DAILY  . polycarbophil (FIBERCON) 625 MG tablet Take 625 mg by mouth 2 (two) times daily.   . vitamin C (ASCORBIC ACID) 500 MG tablet Take 500 mg by mouth daily.   [name] zinc sulfate 220 (50 Zn) MG capsule Take 1 capsule (220 mg total) by mouth daily.   Facility-Administered Encounter Medications as of 05/12/2020  Medication  . lanreotide acetate (SOMATULINE DEPOT) injection 120 mg     Review of Systems  Review of Systems  Constitutional: Negative for activity change, chills, fatigue, fever and unexpected weight change.  HENT: Negative for postnasal drip, rhinorrhea, sinus pressure, sinus pain and sore throat.   Eyes: Negative.   Respiratory: Negative for cough, shortness of breath and wheezing.   Cardiovascular: Negative for  chest pain and palpitations.  Gastrointestinal: Negative for constipation, diarrhea, nausea and vomiting.  Endocrine: Negative.   Genitourinary: Negative.   Musculoskeletal: Negative.   Skin: Negative.   Neurological: Negative for dizziness and headaches.  Psychiatric/Behavioral: Negative.  Negative for dysphoric mood. The  patient is not nervous/anxious.   All other systems reviewed and are negative.    Physical Exam  BP 130/78 (BP Location: Left Arm, Cuff Size: Normal)   Pulse (!) 58   Temp (!) 97.3 F (36.3 C) (Temporal)   Ht 5\' 4"  (1.626 m)   Wt 157 lb 9.6 oz (71.5 kg)   SpO2 99%   BMI 27.05 kg/m   Wt Readings from Last 5 Encounters:  05/12/20 157 lb 9.6 oz (71.5 kg)  03/25/20 156 lb (70.8 kg)  01/23/20 153 lb 4.8 oz (69.5 kg)  01/01/20 154 lb (69.9 kg)  12/19/19 151 lb (68.5 kg)    BMI Readings from Last 5 Encounters:  05/12/20 27.05 kg/m  03/25/20 26.78 kg/m  01/23/20 26.31 kg/m  01/01/20 26.43 kg/m  12/19/19 25.92 kg/m     Physical Exam Vitals and nursing note reviewed.  Constitutional:      General: He is not in acute distress.    Appearance: Normal appearance. He is normal weight.  HENT:     Head: Normocephalic and atraumatic.     Right Ear: Hearing and external ear normal.     Left Ear: Hearing and external ear normal.     Ears:     Comments: Hearing aides bilaterally     Nose: No mucosal edema.     Right Turbinates: Not enlarged.     Left Turbinates: Not enlarged.     Mouth/Throat:     Mouth: Mucous membranes are dry.     Pharynx: Oropharynx is clear. No oropharyngeal exudate.  Eyes:     Pupils: Pupils are equal, round, and reactive to light.  Cardiovascular:     Rate and Rhythm: Normal rate and regular rhythm.     Pulses: Normal pulses.     Heart sounds: Normal heart sounds. No murmur heard.   Pulmonary:     Effort: Pulmonary effort is normal.     Breath sounds: Normal breath sounds. No decreased breath sounds, wheezing or rales.   Musculoskeletal:     Cervical back: Normal range of motion.     Right lower leg: No edema.     Left lower leg: No edema.  Lymphadenopathy:     Cervical: No cervical adenopathy.  Skin:    General: Skin is warm and dry.     Capillary Refill: Capillary refill takes less than 2 seconds.     Findings: No erythema or rash.  Neurological:     General: No focal deficit present.     Mental Status: He is alert and oriented to person, place, and time.     Motor: No weakness.     Coordination: Coordination normal.     Gait: Gait is intact. Gait normal.  Psychiatric:        Mood and Affect: Mood normal.        Behavior: Behavior normal. Behavior is cooperative.        Thought Content: Thought content normal.        Judgment: Judgment normal.       Assessment & Plan:   COPD mixed type ([place]) Okay to stop Breo  We will continue clinically monitor symptoms  Neuroendocrine carcinoma of lung ([place]) Plan: Continue follow-up with oncology  Postinflammatory pulmonary fibrosis ([place]) Plan: We will continue clinically monitor  History of COVID-19 Plan: We will continue clinically monitor Up-to-date with COVID-19 vaccinations    Return in about 4 months (around 09/09/2020), or if symptoms worsen or fail to improve, for [employer] [facility] -  Dr. Patsey Berthold.   Lauraine Rinne, NP 05/12/2020   This appointment required 24 minutes of patient care (this includes precharting, chart review, review of results, face-to-face care, etc.).

## 2020-05-12 NOTE — Patient Instructions (Addendum)
You were seen today by Lauraine Rinne, NP  for:   1. COPD mixed type ([place]) 2. Postinflammatory pulmonary fibrosis (HCC) 3. Chronic respiratory failure with hypoxia (HCC) 4. History of COVID-19  Okay to stop [employer]  Continue to work on increasing your overall physical activity keep up the hard work!  Great job being up-to-date with your COVID-19 vaccinations  Please notify our office if you have worsened breathing, cough, congestion  Note your daily symptoms > remember "red flags" for COPD:   >>>Increase in cough >>>increase in sputum production >>>increase in shortness of breath or activity  intolerance.   If you notice these symptoms, please call the office to be seen.   5. Neuroendocrine carcinoma of lung ([place])  Continue follow-up with oncology   Follow Up:    Return in about 4 months (around 09/09/2020), or if symptoms worsen or fail to improve, for [facility] - Dr. Patsey Berthold.   Notification of test results are managed in the following manner: If there are  any recommendations or changes to the  plan of care discussed in office today,  we will contact you and let you know what they are. If you do not hear from [place], then your results are normal and you can view them through your  MyChart account , or a letter will be sent to you. Thank you again for trusting [place] with your care  - Thank you, [facility]    It is flu season:   >>> Best ways to protect herself from the flu: Receive the yearly flu vaccine, practice good hand hygiene washing with soap and also using hand sanitizer when available, eat a nutritious meals, get adequate rest, hydrate appropriately       Please contact the office if your symptoms worsen or you have concerns that you are not improving.   Thank you for choosing [facility] for your healthcare, and for allowing [place] to partner with you on your healthcare journey. I am thankful to be able to provide care to you today.   Wyn Quaker FNP-C

## 2020-05-20 ENCOUNTER — Inpatient Hospital Stay: Payer: Medicare Other | Attending: Oncology

## 2020-05-20 ENCOUNTER — Inpatient Hospital Stay: Payer: Medicare Other

## 2020-05-20 DIAGNOSIS — C7A090 Malignant carcinoid tumor of the bronchus and lung: Secondary | ICD-10-CM | POA: Diagnosis present

## 2020-05-20 DIAGNOSIS — Z79899 Other long term (current) drug therapy: Secondary | ICD-10-CM | POA: Insufficient documentation

## 2020-05-20 DIAGNOSIS — C7A8 Other malignant neuroendocrine tumors: Secondary | ICD-10-CM

## 2020-05-20 LAB — COMPREHENSIVE METABOLIC PANEL
ALT: 25 U/L (ref 0–44)
AST: 26 U/L (ref 15–41)
Albumin: 3.9 g/dL (ref 3.5–5.0)
Alkaline Phosphatase: 144 U/L — ABNORMAL HIGH (ref 38–126)
Anion gap: 8 (ref 5–15)
BUN: 19 mg/dL (ref 8–23)
CO2: 28 mmol/L (ref 22–32)
Calcium: 9.6 mg/dL (ref 8.9–10.3)
Chloride: 102 mmol/L (ref 98–111)
Creatinine, Ser: 0.91 mg/dL (ref 0.61–1.24)
GFR, Estimated: >60 mL/min (ref >60)
Glucose, Bld: 108 mg/dL — ABNORMAL HIGH (ref 70–99)
Potassium: 4.6 mmol/L (ref 3.5–5.1)
Sodium: 138 mmol/L (ref 135–145)
Total Bilirubin: 0.8 mg/dL (ref 0.3–1.2)
Total Protein: 7.3 g/dL (ref 6.5–8.1)

## 2020-05-20 LAB — CBC WITH DIFFERENTIAL/PLATELET
Abs Immature Granulocytes: 0.05 10*3/uL (ref 0.00–0.07)
Basophils Absolute: 0.1 10*3/uL (ref 0.0–0.1)
Basophils Relative: 1 %
Eosinophils Absolute: 0.1 10*3/uL (ref 0.0–0.5)
Eosinophils Relative: 1 %
HCT: 47.8 % (ref 39.0–52.0)
Hemoglobin: 15.8 g/dL (ref 13.0–17.0)
Immature Granulocytes: 1 %
Lymphocytes Relative: 9 %
Lymphs Abs: 1 10*3/uL (ref 0.7–4.0)
MCH: 30.3 pg (ref 26.0–34.0)
MCHC: 33.1 g/dL (ref 30.0–36.0)
MCV: 91.6 fL (ref 80.0–100.0)
Monocytes Absolute: 1.5 10*3/uL — ABNORMAL HIGH (ref 0.1–1.0)
Monocytes Relative: 14 %
Neutro Abs: 8.1 10*3/uL — ABNORMAL HIGH (ref 1.7–7.7)
Neutrophils Relative %: 74 %
Platelets: 172 10*3/uL (ref 150–400)
RBC: 5.22 MIL/uL (ref 4.22–5.81)
RDW: 14.1 % (ref 11.5–15.5)
WBC: 10.9 10*3/uL — ABNORMAL HIGH (ref 4.0–10.5)
nRBC: 0 % (ref 0.0–0.2)

## 2020-05-20 MED ORDER — LANREOTIDE ACETATE 120 MG/0.5ML ~~LOC~~ SOLN
120.0000 mg | Freq: Once | SUBCUTANEOUS | Status: AC
  Administered 2020-05-20: 120 mg via SUBCUTANEOUS
  Filled 2020-05-20: qty 120

## 2020-05-21 LAB — CHROMOGRANIN A: Chromogranin A (ng/mL): 380.8 ng/mL — ABNORMAL HIGH (ref 0.0–101.8)

## 2020-05-22 NOTE — Progress Notes (Signed)
Patient ID: Jason Malone, male    DOB: [birth year], 80 y.o.   MRN: [id number]  PCP: Towanda Malkin, MD  Chief Complaint  Patient presents with  . Follow-up    Subjective:   Jason Malone is a 80 y.o. male, presents to clinic with CC of the following:  Chief Complaint  Patient presents with  . Follow-up    HPI:  Patient is a 80 year old male Last visit was 01/23/2020 Follows up today. Wife present with him for the assessment.  He notes in general he has been feeling very good.  He notes his breathing has been fairly stable.  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-He continues to be followed by oncology Last visit with oncology was 03/25/2020 with the following assessment/plan: ASSESSMENT: 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 PLAN:   1. 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 Patient underwent left upper lobe lobectomy in [place] on June 04, 2013. Final pathology results revealed a well differentiated neuroendocrine tumor with positive bronchial margins. Patient did not receive adjuvant XRT or chemotherapy at that time. Patient's most recent abdominal MRI on December 21, 2019 reviewed independently and reported as above with no obvious evidence of progression or recurrent disease. His most recent Y 90 ablation occurred on April 28, 2018.  Patient's chromogranin a levels are elevated, but relatively stable ranging from 213-261. Proceed with 120 mg subcutaneous lanreotide today. Return to clinic in 4 and 8 weeks for laboratory work and treatment only. Patient will then return to clinic in 12 weeks for further evaluation and continuation of treatment.  We will plan to get CT chest abdomen pelvis in January/February 2022.  Orders placed.. 2.  Left mainstem endobronchial mass: Resolved.  Consistent with  neuroendocrine tumor.  Patient underwent his third and final bronchoscopy with therapeutic cryoablation ablation on November 26, 2019.  Follow-up with pulmonology as indicated.   3.  Covid: Resolved.  4.  Urinary retention: Resolved. Foley has been removed.    Disposition: Return to clinic in 4 and 8 weeks for lanreotide and lab work only. Orders placed for CT chest abdomen pelvis in January/February 2022 prior to visit with Dr. Grayland Ormond. Return to clinic in 12 weeks for lanreotide injection, lab work and assessment and to review CT scans.    His last visit with pulmonary was 05/12/2020 with the following assessment/plan noted: COPD mixed type ([place]) Okay to stop Breo We will continue clinically monitor symptoms  Neuroendocrine carcinoma of lung ([place]) Plan: Continue follow-up with oncology  Postinflammatory pulmonary fibrosis ([place]) Plan: We will continue clinically monitor  History of COVID-19 Plan: We will continue clinically monitor Up-to-date with COVID-19 vaccinations    Return in about 4 months (around 09/09/2020), or if symptoms worsen or fail to improve, for [facility] - Dr. Patsey Berthold.   He last saw cardiology  12/19/2019 with the following assessment/plan noted:  1.            HTN/Orthostatic hypotension -Improvement in orthostasis symptoms since reduction dose of Losartan.Encouraged adequate hydration, slow position changes.Recent echo with normal LVEF and no significant valvular abnormalities. Continue Losartan at present dose - he has been taking 1.25 tablets of his 72m tablet. BP goal of <140/90. May be able to further reduce Losartan to 525mdaily. He will monitor BP at home.  2. Paroxysmal atrial flutter - Noted  in 2014. Previous atrial flutter in setting of lung cancer and recent diagnosis of neuroendocrine tumor.Recent 14 day ZIO with no evidence of recurrence.  3. Lung cancer - Continue to follow with pulmonology and oncology.   4. Coronary  artery calcification on CT/Aortic atherosclerosis - Stable finding. No anginal symptoms. EKG today with no acute ST/T wave changes. No indication for ischemic evaluation at this time.   5. DOE - Likely multifactorial deconditioning, lung cancer.Recent echo and ZI ounrevealing. Reports improvement in dyspnea.  Disposition: Follow upin23months)with Dr. GRockey Situor APP.     Hypertension:  Currently managed on losartan -was taking 1.25 tablets of his 50 mg tablet, but recently increased back to 1076mlosartan about 3 weeks ago as his blood pressures were slightly higher on home checks, better since the increase Patient is taking medication regularly BP checks at home averaging in the 130's/78-80 in recent past  the orthostasis symptoms remain improved even with the increase again of the losartan He is staying well-hydrated BP Readings from Last 3 Encounters:  05/23/20 130/60  05/12/20 130/78  03/25/20 136/85    Denies any CP's, palp's, increased SOB, no dizzy spells recently. No increased leg swelling, notes an occasional HA, not problematic   Hyperlipidemia: Current Medication Regimen:lipitor40 mg Taking medication regularly Lab Results  Component Value Date   CHOL 83 06/15/2019   HDL 32 (L) 06/15/2019   LDLCALC 42 06/15/2019   TRIG 44 06/15/2019   CHOLHDL 2.6 06/15/2019   Denies increased myalgias  Diabetes Mellitus Type II: Medication regimen-none,currently managing withlifestyle he checkshis blood sugars, usually runin 120-140,denies any really low blood sugars. Denies:Increased thirst,urinates frequently at night due to prostate, although that is much improved after the procedure in early August Has eye appt in May scheduled ACEI/ARB:Yes Statin:Yes  Lab Results  Component Value Date   HGBA1C 6.5 (A) 05/23/2020   HGBA1C 6.6 (A) 10/25/2019   HGBA1C 6.9 (H) 07/05/2019   Lab Results  Component Value Date   MICROALBUR 50 05/23/2020    LDLCALC 42 06/15/2019   CREATININE 0.91 05/20/2020     BPH with LUTS Medication regimen - off finasteride now Continues to be followed by urology, Had prostate procedure ("steaming")- August, symptoms remain improved.      Patient Active Problem List   Diagnosis Date Noted  . History of COVID-19 05/12/2020  . Chronic respiratory failure with hypoxia (HCWalnut Park01/07/2020  . Postinflammatory pulmonary fibrosis (HCEndicott01/07/2020  . COPD mixed type (HCFremont01/07/2020  . Decreased hearing 01/23/2020  . Hyponatremia 07/03/2019  . Metabolic acidosis 0232/20/2542. BPH (benign prostatic hyperplasia) 07/03/2019  . PNA (pneumonia) 07/03/2019  . Diabetes mellitus type 2, controlled, with complications (HCConnerton0270/62/3762. HLD (hyperlipidemia) 06/14/2019  . 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 Respiratory failure (HCScaggsville02/05/2019  . Elevated LFTs 06/11/2019  . Pneumonia due to COVID-19 virus 06/10/2019  . Contracture of palmar fascia 10/11/2018  . Microalbuminuria due to type 2 diabetes mellitus (HCRosebush12/08/2017  . Type 2 diabetes mellitus (HCSanta Rosa12/06/2017  . Osteopenia determined by x-ray 01/07/2017  . Neuroendocrine carcinoma of lung (HCNeihart08/21/2018  . Low back pain 12/17/2016  . Liver mass, left lobe 08/28/2016  . 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 History of lung cancer 06/29/2016  . Aortic atherosclerosis (HCAnton Chico11/28/2017  . Coronary artery disease involving native heart without angina pectoris 04/06/2016  . History of colonic polyps 10/28/2015  . Medication monitoring encounter 08/15/2015  . Paroxysmal atrial flutter (HCHidden Valley  .  Elevated serum alkaline phosphatase level 03/05/2015  . Allergic rhinitis   . BPH without urinary obstruction   . Hyperlipidemia 02/13/2014  . Orthostasis 07/18/2013  . Sinus bradycardia 07/18/2013  . S/P lobectomy of lung 06/04/2013  . Atrial flutter ([place]) 04/09/2013  . Smoking history 04/09/2013  . Essential hypertension  04/09/2013  . Rectal bleeding 12/15/2012      Current Outpatient Medications:  .  aspirin EC 81 MG tablet, Take 81 mg by mouth daily. , Disp: , Rfl:  .  atorvastatin (LIPITOR) 40 MG tablet, TAKE 1 TABLET BY MOUTH ONCE DAILY FOR CHOLESTEROL, Disp: 90 tablet, Rfl: 1 .  b complex vitamins capsule, Take 1 capsule by mouth daily., Disp: , Rfl:  .  cholecalciferol (VITAMIN D3) 25 MCG (1000 UNIT) tablet, Take 1,000 Units by mouth daily., Disp: , Rfl:  .  Cinnamon 500 MG capsule, Take 500 mg by mouth at bedtime. , Disp: , Rfl:  .  diphenhydrAMINE (BENADRYL) 25 mg capsule, Take 25 mg by mouth every morning. , Disp: , Rfl:  .  DM-APAP-CPM (CORICIDIN HBP PO), Take 2 tablets by mouth daily as needed (allergies/cold symptoms)., Disp: , Rfl:  .  fluticasone furoate-vilanterol (BREO ELLIPTA) 100-25 MCG/INH AEPB, Inhale 1 puff into the lungs daily., Disp: 28 each, Rfl: 11 .  losartan (COZAAR) 50 MG tablet, Take 1.5 tablets (75 mg total) by mouth daily., Disp: 135 tablet, Rfl: 1 .  Multiple Vitamins-Minerals (MULTIVITAMIN WITH MINERALS) tablet, Take 1 tablet by mouth daily. , Disp: , Rfl:  .  ONETOUCH VERIO test strip, USE 1 STRIP TO CHECK GLUCOSE ONCE DAILY, Disp: 50 each, Rfl: 0 .  polycarbophil (FIBERCON) 625 MG tablet, Take 625 mg by mouth 2 (two) times daily. , Disp: , Rfl:  .  vitamin C (ASCORBIC ACID) 500 MG tablet, Take 500 mg by mouth daily. , Disp: , Rfl:  .  zinc sulfate 220 (50 Zn) MG capsule, Take 1 capsule (220 mg total) by mouth daily., Disp: 30 capsule, Rfl: 0 .  finasteride (PROSCAR) 5 MG tablet, Take 1 tablet (5 mg total) by mouth daily. (Patient not taking: Reported on 05/23/2020), Disp: 90 tablet, Rfl: 1 No current facility-administered medications for this visit.  Facility-Administered Medications Ordered in Other Visits:  .  lanreotide acetate (SOMATULINE DEPOT) injection 120 mg, 120 mg, Subcutaneous, Once, Grayland Ormond, Kathlene November, MD   Allergies  Allergen Reactions  . Tape Itching     Surgical tapes      Past Surgical History:  Procedure Laterality Date  . BRONCHOSCOPY     04/06/2013  . CARDIOVASCULAR STRESS TEST  05/2013   a. No evidence of ischemia or infarct, EF 70%, no WMAs  . CATARACT EXTRACTION W/PHACO Left 07/14/2015   Procedure: CATARACT EXTRACTION PHACO AND INTRAOCULAR LENS PLACEMENT (IOC);  Surgeon: [name], MD;  Location: [place];  Service: Ophthalmology;  Laterality: Left;  . CATARACT EXTRACTION W/PHACO Right 10/01/2015   Procedure: CATARACT EXTRACTION PHACO AND INTRAOCULAR LENS PLACEMENT (IOC);  Surgeon: [name], MD;  Location: [place];  Service: Ophthalmology;  Laterality: Right;  . COLONOSCOPY W/ POLYPECTOMY     bleed after-had to go to surgery to stop bleeding via colonoscopy  . COLONOSCOPY WITH PROPOFOL N/A 11/19/2015   Procedure: COLONOSCOPY WITH PROPOFOL;  Surgeon: [name], MD;  [facility];  Service: Endoscopy;  Laterality: N/A;  . DUPUYTREN CONTRACTURE RELEASE  12/15/2011   Procedure: DUPUYTREN CONTRACTURE RELEASE;  Surgeon: [name], MD;  Location: [place]  [facility];  Service: Orthopedics;  Laterality: Left;  Fasciotomy left ring finger dupuytrens  . ELECTROMAGNETIC NAVIGATION BROCHOSCOPY Left 09/19/2019   Procedure: ELECTROMAGNETIC NAVIGATION BRONCHOSCOPY;  Surgeon: Tyler Pita, MD;  Location: [facility];  Service: Cardiopulmonary;  Laterality: Left;  . FASCIECTOMY Right 11/07/2018   Procedure: SEGMENTAL FASCIECTOMY RIGHT RING FINGER;  Surgeon: Daryll Brod, MD;  Location: [place];  Service: Orthopedics;  Laterality: Right;  AXILLARY BLOCK  . FLEXIBLE BRONCHOSCOPY Left 11/26/2019   Procedure: FLEXIBLE BRONCHOSCOPY WITH CRYO THERAPY;  Surgeon: Tyler Pita, MD;  Location: [facility];  Service: Cardiopulmonary;  Laterality: Left;  . IR ANGIOGRAM SELECTIVE EACH ADDITIONAL VESSEL  07/26/2017  . IR ANGIOGRAM SELECTIVE EACH ADDITIONAL VESSEL  07/26/2017  .  IR ANGIOGRAM SELECTIVE EACH ADDITIONAL VESSEL  07/26/2017  . IR ANGIOGRAM SELECTIVE EACH ADDITIONAL VESSEL  07/26/2017  . IR ANGIOGRAM SELECTIVE EACH ADDITIONAL VESSEL  07/26/2017  . IR ANGIOGRAM SELECTIVE EACH ADDITIONAL VESSEL  08/11/2017  . IR ANGIOGRAM SELECTIVE EACH ADDITIONAL VESSEL  04/28/2018  . IR ANGIOGRAM VISCERAL SELECTIVE  07/26/2017  . IR ANGIOGRAM VISCERAL SELECTIVE  08/11/2017  . IR ANGIOGRAM VISCERAL SELECTIVE  04/28/2018  . IR EMBO ARTERIAL NOT HEMORR HEMANG INC GUIDE ROADMAPPING  07/26/2017  . IR EMBO TUMOR ORGAN ISCHEMIA INFARCT INC GUIDE ROADMAPPING  08/11/2017  . IR EMBO TUMOR ORGAN ISCHEMIA INFARCT INC GUIDE ROADMAPPING  04/28/2018  . IR RADIOLOGIST EVAL & MGMT  07/07/2017  . IR RADIOLOGIST EVAL & MGMT  09/07/2017  . IR RADIOLOGIST EVAL & MGMT  12/21/2017  . IR RADIOLOGIST EVAL & MGMT  03/22/2018  . IR RADIOLOGIST EVAL & MGMT  06/08/2018  . IR RADIOLOGIST EVAL & MGMT  11/01/2018  . IR RADIOLOGIST EVAL & MGMT  03/21/2019  . IR RADIOLOGIST EVAL & MGMT  11/08/2019  . IR US GUIDE VASC ACCESS RIGHT  07/26/2017  . IR US GUIDE VASC ACCESS RIGHT  08/11/2017  . IR US GUIDE VASC ACCESS RIGHT  04/28/2018  . LUNG LOBECTOMY  06/10/13   upper left lung  . MULTIPLE TOOTH EXTRACTIONS    . REMOVAL RETAINED LENS Right 11/17/2015   Procedure: REMOVAL RETAINED LENS FROAGMENTS RIGHT EYE;  Surgeon: [name], MD;  Location: [place];  Service: Ophthalmology;  Laterality: Right;  . TONSILLECTOMY    . UPPER GASTROINTESTINAL ENDOSCOPY  11-03-15   Dr [name]  . VASECTOMY    . VIDEO ASSISTED THORACOSCOPY (VATS)/WEDGE RESECTION Left 06/04/2013   Procedure: VIDEO ASSISTED THORACOSCOPY (VATS)/WEDGE RESECTION;  Surgeon: [name], MD;  Location: [place];  Service: Thoracic;  Laterality: Left;  [name] VIDEO BRONCHOSCOPY N/A 06/04/2013   Procedure: VIDEO BRONCHOSCOPY;  Surgeon: [name], MD;  Location: [facility];  Service: Thoracic;  Laterality: N/A;     Family History  Problem Relation  Age of Onset  . Stroke Mother   . Alzheimer's disease Mother   . Hypertension Sister   . Hyperlipidemia Sister   . Hypertension Brother   . Hyperlipidemia Brother   . Diabetes Brother      Social History   Tobacco Use  . Smoking status: Former Smoker    Packs/day: 1.00    Years: 40.00    Pack years: 40.00    Types: Cigarettes    Quit date: 12/09/1998    Years since quitting: 21.4  . Smokeless tobacco: Never Used  Substance Use Topics  . Alcohol use: No    With staff assistance, above reviewed with the patient today.  ROS: As per HPI, otherwise no specific complaints on a limited and focused system review   Results for orders placed or performed in visit on 05/23/20 (from the past 72 hour(s))  POCT glycosylated hemoglobin (Hb A1C)     Status: Abnormal   Collection Time: 05/23/20  9:57 AM  Result Value Ref Range   Hemoglobin A1C 6.5 (A) 4.0 - 5.6 %   HbA1c POC (<> result, manual entry)     HbA1c, POC (prediabetic range)     HbA1c, POC (controlled diabetic range)    POCT UA - Microalbumin     Status: None   Collection Time: 05/23/20  9:57 AM  Result Value Ref Range   Microalbumin Ur, POC 50 mg/L   Creatinine, POC     Albumin/Creatinine Ratio, Urine, POC       PHQ2/9: Depression screen [facility] 2/9 05/23/2020 01/23/2020 10/25/2019 07/10/2019 06/07/2019  Decreased Interest 0 0 0 0 0  Down, Depressed, Hopeless 0 0 0 0 0  PHQ - 2 Score 0 0 0 0 0  Altered sleeping - - 0 2 0  Tired, decreased energy - - 3 3 0  Change in appetite - - 0 0 0  Feeling bad or failure about yourself  - - 0 0 0  Trouble concentrating - - 0 0 0  Moving slowly or fidgety/restless - - 0 0 0  Suicidal thoughts - - 0 0 0  PHQ-9 Score - - 3 5 0  Difficult doing work/chores - - Not difficult at all Not difficult at all -  Some recent data might be hidden   PHQ-2/9 Result is neg  Fall Risk: Fall Risk  05/23/2020 01/23/2020 10/25/2019 07/10/2019 06/07/2019  Falls in the past year? 0 0 - 0 0  Number falls in  past yr: 0 0 0 0 0  Injury with Fall? 0 0 0 0 0  Comment - - - - -  Follow up - Falls evaluation completed - - -      Objective:   Vitals:   05/23/20 0955  BP: 130/60  Pulse: 67  Resp: 16  Temp: 97.9 F (36.6 C)  TempSrc: Oral  SpO2: 99%  Weight: 158 lb 1.6 oz (71.7 kg)  Height: _0  (1.626 m)    Body mass index is 27.14 kg/m.  Physical Exam   NAD, masked,pleasant, looks well, pleasant HEENT - [place]/AT, sclera anicteric, PERRL, EOMI,conj - non-inj'ed, pharynx clear Neck - supple, no adenopathy, carotids 2+ and = without bruits bilat Car -regular rate and rhythmwithout m/g/r Pulm- CTA without wheeze or rales Abd - soft, NTdiffusely, ND, Back - no CVA tenderness Ext - no LE edema,  Neuro/psychiatric - affect was not flat, appropriate with conversation Alert Grossly non-focal Speech normal  Results for orders placed or performed in visit on 05/23/20  POCT glycosylated hemoglobin (Hb A1C)  Result Value Ref Range   Hemoglobin A1C 6.5 (A) 4.0 - 5.6 %   HbA1c POC (<> result, manual entry)     HbA1c, POC (prediabetic range)     HbA1c, POC (controlled diabetic range)    POCT UA - Microalbumin  Result Value Ref Range   Microalbumin Ur, POC 50 mg/L   Creatinine, POC     Albumin/Creatinine Ratio, Urine, POC     A1c was 6.5 today. Urine microalbumin result noted. Assessment & Plan:     1. Dyspnea/COPD/postinflammatory pulmonary fibrosis Remains stable, continues to have pulmonary input with recent follow-up in early January 2022 noted It  was noted okay to stop the Breo at that visit, as he noted today it really was not helping at all.  2. Essential hypertension Blood pressure remains controlled with the losartan dose recently increased by the patient to 100 mg daily again.  He continues to monitor these at home. Cardiology has been involved in helping with blood pressure management as well. We will continue the losartan presently and continue with  blood pressure checks as he is doing.  3. Orthostasis He notes he has not had orthostatic symptoms in the recent past, even with the losartan dose increased again to 100 mg. Continue to stay well hydrated recommended Continue to monitor.   4. Type 2 diabetes mellitus without complication, without long-term current use of insulin (HCC) A1c checked today was 6.5.  Improved some from previous, with goal of A1c between 6 and 7 Continues without medications to manage presently He will continue to check blood sugars at home Continues a statin presently Has eye appointment scheduled this spring.  5. Mixed hyperlipidemia Remains on a statin with last lipid check good. Continue the statin presently.  6. Benign prostatic hyperplasia with urinary hesitancy Followed by urology, with symptoms improved with the recent procedure in early August noted. Continue to follow-up with urology as planned  7. Neuroendocrine carcinoma of lung ([place]) Continues to follow with oncology  8. Decreased hearing, unspecified laterality Agreed to continue to monitor presently, although if this is more problematic in the near future, may need to have a hearing test done, possibly evaluated for hearing aids if felt would be helpful.  Discussed follow-up, and he would prefer to follow-up in a 71-monthtimeframe and feel reasonable.  He sees other providers as well and will continue to do so.  Can follow-up sooner as needed He is aware that his follow-up will be with a new provider here as I will be leaving the practice prior to that planned follow-up      CTowanda Malkin MD 05/23/20 10:01 AM

## 2020-05-23 ENCOUNTER — Other Ambulatory Visit: Payer: Self-pay

## 2020-05-23 ENCOUNTER — Encounter: Payer: Self-pay | Admitting: Internal Medicine

## 2020-05-23 ENCOUNTER — Ambulatory Visit (INDEPENDENT_AMBULATORY_CARE_PROVIDER_SITE_OTHER): Payer: Medicare Other | Admitting: Internal Medicine

## 2020-05-23 ENCOUNTER — Other Ambulatory Visit: Payer: Self-pay | Admitting: Interventional Radiology

## 2020-05-23 VITALS — BP 130/60 | HR 67 | Temp 97.9°F | Resp 16 | Ht 64.0 in | Wt 158.1 lb

## 2020-05-23 DIAGNOSIS — I10 Essential (primary) hypertension: Secondary | ICD-10-CM

## 2020-05-23 DIAGNOSIS — I951 Orthostatic hypotension: Secondary | ICD-10-CM

## 2020-05-23 DIAGNOSIS — H9190 Unspecified hearing loss, unspecified ear: Secondary | ICD-10-CM

## 2020-05-23 DIAGNOSIS — C7A8 Other malignant neuroendocrine tumors: Secondary | ICD-10-CM

## 2020-05-23 DIAGNOSIS — E119 Type 2 diabetes mellitus without complications: Secondary | ICD-10-CM | POA: Diagnosis not present

## 2020-05-23 DIAGNOSIS — R3911 Hesitancy of micturition: Secondary | ICD-10-CM

## 2020-05-23 DIAGNOSIS — C7B8 Other secondary neuroendocrine tumors: Secondary | ICD-10-CM

## 2020-05-23 DIAGNOSIS — E782 Mixed hyperlipidemia: Secondary | ICD-10-CM | POA: Diagnosis not present

## 2020-05-23 DIAGNOSIS — N401 Enlarged prostate with lower urinary tract symptoms: Secondary | ICD-10-CM

## 2020-05-23 DIAGNOSIS — J449 Chronic obstructive pulmonary disease, unspecified: Secondary | ICD-10-CM

## 2020-05-23 LAB — POCT GLYCOSYLATED HEMOGLOBIN (HGB A1C): Hemoglobin A1C: 6.5 % — AB (ref 4.0–5.6)

## 2020-05-23 LAB — POCT UA - MICROALBUMIN: Microalbumin Ur, POC: 50 mg/L

## 2020-06-12 ENCOUNTER — Ambulatory Visit
Admission: RE | Admit: 2020-06-12 | Discharge: 2020-06-12 | Disposition: A | Discharge status: Home / Self Care | Payer: Medicare Other | Source: Ambulatory Visit | Attending: Oncology | Admitting: Oncology

## 2020-06-12 ENCOUNTER — Other Ambulatory Visit: Payer: Self-pay

## 2020-06-12 DIAGNOSIS — C7A8 Other malignant neuroendocrine tumors: Secondary | ICD-10-CM | POA: Diagnosis not present

## 2020-06-12 DIAGNOSIS — C7B8 Other secondary neuroendocrine tumors: Secondary | ICD-10-CM | POA: Insufficient documentation

## 2020-06-12 MED ORDER — IOHEXOL 300 MG/ML  SOLN
100.0000 mL | Freq: Once | INTRAMUSCULAR | Status: AC | PRN
  Administered 2020-06-12: 100 mL via INTRAVENOUS

## 2020-06-16 NOTE — Progress Notes (Signed)
Jason Malone  [phone] [phone]  ID: Jason Malone OB: Mar 01, 1941  MR#: [id number]  AOZ#:[id number]  Patient Care Team: Towanda Malkin, MD (Inactive) as PCP - General (Internal Medicine) Minna Merritts, MD as PCP - Cardiology (Cardiology) [name], Forest Gleason, MD (General Surgery) Nestor Lewandowsky, MD as Referring Physician (Cardiothoracic Surgery) Minna Merritts, MD as Consulting Physician (Cardiology) [name], MD as Consulting Physician (Cardiothoracic Surgery) Lloyd Huger, MD as Consulting Physician (Oncology) Sanda Klein Satira Anis, MD as Attending Physician (Family Medicine)   CHIEF COMPLAINT: 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  INTERVAL HISTORY: Patient returns to clinic today for further evaluation, discussion of his imaging results, and continuation of treatment. He currently feels well and is asymptomatic. He does not complain of pain. He denies any weakness or fatigue today.  He has no neurologic complaints.  He has a good appetite and denies weight loss.  He denies any chest pain, shortness of breath, cough, or hemoptysis.  He denies any abdominal pain.  He has no nausea, vomiting, constipation, or diarrhea.  He has no urinary complaints. Patient offers no specific complaints today.   REVIEW OF SYSTEMS:   Review of Systems  Constitutional: Negative.  Negative for fever, malaise/fatigue and weight loss.  Respiratory: Negative.  Negative for cough, hemoptysis and shortness of breath.   Cardiovascular: Negative.  Negative for chest pain and leg swelling.  Gastrointestinal: Negative.  Negative for abdominal pain, diarrhea, nausea and vomiting.  Genitourinary: Negative.  Negative for dysuria.  Musculoskeletal: Negative.  Negative for back pain.  Skin: Negative.  Negative for rash.  Neurological: Negative.  Negative for dizziness, sensory change, focal weakness and weakness.   Psychiatric/Behavioral: Negative.  The patient is not nervous/anxious.     As per HPI. Otherwise, a complete review of systems is negative.  PAST MEDICAL HISTORY: Past Medical History:  Diagnosis Date  . Allergic rhinitis   . Benign prostatic hypertrophy with lower urinary tract symptoms (LUTS)   . Chest pain    a. 05/2013 MV: EF 70%, no ischemia.  . Diverticulitis    DIVERTICULOSIS  . Enlarged prostate   . Essential hypertension    CONTROLLED ON MEDS  . Full dentures    upper and lower  . H/O hypokalemia   . Hx of atrial fibrillation without current medication    CARDIOLOGIST-DR [facility]  . 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 Hyperlipemia   . Hypomagnesemia   . IFG (impaired fasting glucose)   . Liver cancer ([place])   . Liver cancer ([place]) 12/08/2016  . Liver mass, left lobe 08/28/2016   Probably hemangioma; will get MRI of liver, refer to GI  . Lung cancer ([place])    a. carcinoid, left lung, Stage 1b (T2a, N0, cM0);  b. 05/2013 s/p VATS & LULobectomy.  . Monocytosis   . Osteopenia   . Paroxysmal atrial flutter ([facility])    a. 03/2013->no recurrence;  b. CHA2DS2VASc = 2-->not currently on anticoagulation;  c. 05/2012 Echo: EF 55-60%, normal RV.  [name] Wears glasses     PAST SURGICAL HISTORY: Past Surgical History:  Procedure Laterality Date  . BRONCHOSCOPY     04/06/2013  . CARDIOVASCULAR STRESS TEST  05/2013   a. No evidence of ischemia or infarct, EF 70%, no WMAs  . CATARACT EXTRACTION W/PHACO Left 07/14/2015   Procedure: CATARACT EXTRACTION PHACO AND INTRAOCULAR LENS PLACEMENT (IOC);  Surgeon: [name], MD;  Location: [place];  Service: Ophthalmology;  Laterality: Left;  . CATARACT EXTRACTION W/PHACO Right 10/01/2015   Procedure: CATARACT EXTRACTION PHACO AND INTRAOCULAR LENS PLACEMENT (IOC);  Surgeon: [name], MD;  Location: [place];  Service: Ophthalmology;  Laterality: Right;  . COLONOSCOPY W/  POLYPECTOMY     bleed after-had to go to surgery to stop bleeding via colonoscopy  . COLONOSCOPY WITH PROPOFOL N/A 11/19/2015   Procedure: COLONOSCOPY WITH PROPOFOL;  Surgeon: [name], MD;  Location: [facility] ENDOSCOPY;  Service: Endoscopy;  Laterality: N/A;  . DUPUYTREN CONTRACTURE RELEASE  12/15/2011   Procedure: DUPUYTREN CONTRACTURE RELEASE;  Surgeon: [name], MD;  Location: [place];  Service: Orthopedics;  Laterality: Left;  Fasciotomy left ring finger dupuytrens  . ELECTROMAGNETIC NAVIGATION BROCHOSCOPY Left 09/19/2019   Procedure: ELECTROMAGNETIC NAVIGATION BRONCHOSCOPY;  Surgeon: Tyler Pita, MD;  Location: [facility];  Service: Cardiopulmonary;  Laterality: Left;  . FASCIECTOMY Right 11/07/2018   Procedure: SEGMENTAL FASCIECTOMY RIGHT RING FINGER;  Surgeon: Daryll Brod, MD;  Location: [place];  Service: Orthopedics;  Laterality: Right;  AXILLARY BLOCK  . FLEXIBLE BRONCHOSCOPY Left 11/26/2019   Procedure: FLEXIBLE BRONCHOSCOPY WITH CRYO THERAPY;  Surgeon: Tyler Pita, MD;  Location: [facility];  Service: Cardiopulmonary;  Laterality: Left;  . IR ANGIOGRAM SELECTIVE EACH ADDITIONAL VESSEL  07/26/2017  . IR ANGIOGRAM SELECTIVE EACH ADDITIONAL VESSEL  07/26/2017  . IR ANGIOGRAM SELECTIVE EACH ADDITIONAL VESSEL  07/26/2017  . IR ANGIOGRAM SELECTIVE EACH ADDITIONAL VESSEL  07/26/2017  . IR ANGIOGRAM SELECTIVE EACH ADDITIONAL VESSEL  07/26/2017  . IR ANGIOGRAM SELECTIVE EACH ADDITIONAL VESSEL  08/11/2017  . IR ANGIOGRAM SELECTIVE EACH ADDITIONAL VESSEL  04/28/2018  . IR ANGIOGRAM VISCERAL SELECTIVE  07/26/2017  . IR ANGIOGRAM VISCERAL SELECTIVE  08/11/2017  . IR ANGIOGRAM VISCERAL SELECTIVE  04/28/2018  . IR EMBO ARTERIAL NOT HEMORR HEMANG INC GUIDE ROADMAPPING  07/26/2017  . IR EMBO TUMOR ORGAN ISCHEMIA INFARCT INC GUIDE ROADMAPPING  08/11/2017  . IR EMBO TUMOR ORGAN ISCHEMIA INFARCT INC GUIDE ROADMAPPING  04/28/2018  . IR RADIOLOGIST EVAL & MGMT   07/07/2017  . IR RADIOLOGIST EVAL & MGMT  09/07/2017  . IR RADIOLOGIST EVAL & MGMT  12/21/2017  . IR RADIOLOGIST EVAL & MGMT  03/22/2018  . IR RADIOLOGIST EVAL & MGMT  06/08/2018  . IR RADIOLOGIST EVAL & MGMT  11/01/2018  . IR RADIOLOGIST EVAL & MGMT  03/21/2019  . IR RADIOLOGIST EVAL & MGMT  11/08/2019  . IR US GUIDE VASC ACCESS RIGHT  07/26/2017  . IR US GUIDE VASC ACCESS RIGHT  08/11/2017  . IR US GUIDE VASC ACCESS RIGHT  04/28/2018  . LUNG LOBECTOMY  06/10/13   upper left lung  . MULTIPLE TOOTH EXTRACTIONS    . REMOVAL RETAINED LENS Right 11/17/2015   Procedure: REMOVAL RETAINED LENS FROAGMENTS RIGHT EYE;  Surgeon: [name], MD;  Location: [place];  Service: Ophthalmology;  Laterality: Right;  . TONSILLECTOMY    . UPPER GASTROINTESTINAL ENDOSCOPY  11-03-15   Dr [name]  . VASECTOMY    . VIDEO ASSISTED THORACOSCOPY (VATS)/WEDGE RESECTION Left 06/04/2013   Procedure: VIDEO ASSISTED THORACOSCOPY (VATS)/WEDGE RESECTION;  Surgeon: [name], MD;  Location: [place];  Service: Thoracic;  Laterality: Left;  [name] VIDEO BRONCHOSCOPY N/A 06/04/2013   Procedure: VIDEO BRONCHOSCOPY;  Surgeon: [name], MD;  Location: [place];  Service: Thoracic;  Laterality: N/A;    FAMILY HISTORY: Family History  Problem Relation  Age of Onset  . Stroke Mother   . Alzheimer's disease Mother   . Hypertension Sister   . Hyperlipidemia Sister   . Hypertension Brother   . Hyperlipidemia Brother   . Diabetes Brother     ADVANCED DIRECTIVES (Y/N):  N  HEALTH MAINTENANCE: Social History   Tobacco Use  . Smoking status: Former Smoker    Packs/day: 1.00    Years: 40.00    Pack years: 40.00    Types: Cigarettes    Quit date: 12/09/1998    Years since quitting: 21.5  . Smokeless tobacco: Never Used  Vaping Use  . Vaping Use: Never used  Substance Use Topics  . Alcohol use: No  . Drug use: No     Colonoscopy:  PAP:  Bone density:  Lipid panel:  Allergies  Allergen Reactions   . Tape Itching    Surgical tapes     Current Outpatient Medications  Medication Sig Dispense Refill  . aspirin EC 81 MG tablet Take 81 mg by mouth daily.     [name] atorvastatin (LIPITOR) 40 MG tablet TAKE 1 TABLET BY MOUTH ONCE DAILY FOR CHOLESTEROL 90 tablet 1  . b complex vitamins capsule Take 1 capsule by mouth daily.    . cholecalciferol (VITAMIN D3) 25 MCG (1000 UNIT) tablet Take 1,000 Units by mouth daily.    . Cinnamon 500 MG capsule Take 500 mg by mouth at bedtime.     . diphenhydrAMINE (BENADRYL) 25 mg capsule Take 25 mg by mouth every morning.     [name] DM-APAP-CPM (CORICIDIN HBP PO) Take 2 tablets by mouth daily as needed (allergies/cold symptoms).    . fluticasone furoate-vilanterol (BREO ELLIPTA) 100-25 MCG/INH AEPB Inhale 1 puff into the lungs daily. 28 each 11  . losartan (COZAAR) 50 MG tablet Take 1.5 tablets (75 mg total) by mouth daily. 135 tablet 1  . Multiple Vitamins-Minerals (MULTIVITAMIN WITH MINERALS) tablet Take 1 tablet by mouth daily.     Glory Rosebush VERIO test strip USE 1 STRIP TO CHECK GLUCOSE ONCE DAILY 50 each 0  . polycarbophil (FIBERCON) 625 MG tablet Take 625 mg by mouth 2 (two) times daily.     . vitamin C (ASCORBIC ACID) 500 MG tablet Take 500 mg by mouth daily.     [name] zinc sulfate 220 (50 Zn) MG capsule Take 1 capsule (220 mg total) by mouth daily. 30 capsule 0   No current facility-administered medications for this visit.   Facility-Administered Medications Ordered in Other Visits  Medication Dose Route Frequency Provider Last Rate Last Admin  . lanreotide acetate (SOMATULINE DEPOT) injection 120 mg  120 mg Subcutaneous Once Lloyd Huger, MD        OBJECTIVE: Vitals:   06/17/20 1055  BP: (!) 155/79  Pulse: (!) 55  Resp: 18  Temp: 97.8 F (36.6 C)     Body mass index is 26.98 kg/m.    ECOG FS:0 - Asymptomatic  General: Well-developed, well-nourished, no acute distress. Eyes: Pink conjunctiva, anicteric sclera. HEENT: Normocephalic, moist  mucous membranes. Lungs: No audible wheezing or coughing. Heart: Regular rate and rhythm. Abdomen: Soft, nontender, no obvious distention. Musculoskeletal: No edema, cyanosis, or clubbing. Neuro: Alert, answering all questions appropriately. Cranial nerves grossly intact. Skin: No rashes or petechiae noted. Psych: Normal affect.   LAB RESULTS:  Lab Results  Component Value Date   NA 139 06/17/2020   K 4.4 06/17/2020   CL 103 06/17/2020   CO2 26 06/17/2020   GLUCOSE 90  06/17/2020   BUN 21 06/17/2020   CREATININE 1.01 06/17/2020   CALCIUM 9.6 06/17/2020   PROT 7.2 06/17/2020   ALBUMIN 4.2 06/17/2020   AST 26 06/17/2020   ALT 27 06/17/2020   ALKPHOS 124 06/17/2020   BILITOT 0.6 06/17/2020   GFRNONAA >60 06/17/2020   GFRAA >60 01/29/2020    Lab Results  Component Value Date   WBC 10.7 (H) 06/17/2020   NEUTROABS 7.8 (H) 06/17/2020   HGB 15.7 06/17/2020   HCT 47.0 06/17/2020   MCV 90.9 06/17/2020   PLT 175 06/17/2020     STUDIES: CT CHEST ABDOMEN PELVIS W CONTRAST  Result Date: 06/12/2020 CLINICAL DATA:  Restaging of stage IVa neuroendocrine tumor of the lung metastatic to the liver. Prior left upper lobectomy May 25, 2013. Most recent Y 90 ablation April 28, 2018. Left mainstem bronchus endobronchial mass consistent with neuroendocrine tumor, with ablation therapies. Lanreotide therapy. EXAM: CT CHEST, ABDOMEN, AND PELVIS WITH CONTRAST TECHNIQUE: Multidetector CT imaging of the chest, abdomen and pelvis was performed following the standard protocol during bolus administration of intravenous contrast. CONTRAST:  155m OMNIPAQUE IOHEXOL 300 MG/ML  SOLN COMPARISON:  MRI abdomen December 21, 2019 and CT chest abdomen pelvis November 02 2019. FINDINGS: CT CHEST FINDINGS Cardiovascular: Normal size heart. No pericardial effusion. Aortic and branch vessel atherosclerosis. No central pulmonary embolus Mediastinum/Nodes: No mediastinal, hilar or axillary lymphadenopathy. Thyroid  unremarkable. Esophagus unremarkable trachea central airways are patent. There is similar minimal residual wall irregularity in the vicinity of prior bilobar lesions in the left mainstem bronchus (series 2, image 29). Lungs/Pleura: Emphysema with similar right greater than left scattered scarring with subpleural reticulation and unchanged right-sided ground-glass. Stable changes of left lobectomy. Right upper lobe calcified. No new suspicious pulmonary nodules or masses. Musculoskeletal: Remote fractures the anterior right fifth and sixth ribs, unchanged. Mild sclerosis anteriorly in the right sixth rib is unchanged from prior examinations. Multilevel degenerative changes spine. Sclerotic rim 1 cm lesion in the T11 vertebral body eccentric to the left similar appearance since 08/15/2019. Multiple Schmorl's nodes of the thoracic spine. CT ABDOMEN PELVIS FINDINGS Hepatobiliary: Similar size of the partially exophytic segment III hepatic metastasis which measures 1.9 cm previously 2.0 cm (series 2, image 54). Unchanged size of the 3 mm hypodense lesion in the right hepatic lobe (series 2, image 62). No new suspicious hepatic lesions. Gallbladder is decompressed. No biliary ductal dilatation. Pancreas: Unremarkable Spleen: Unchanged size of the 1.2 cm hypodense lesion in the spleen (series 2, image 55), which again is stable across multiple prior examinations dating back to at least 03/15/2019 and is favored to be benign. Adrenals/Urinary Tract: Left renal atrophy. Scarring in the right midpole unchanged. Unchanged subcentimeter hypodense left renal lesion in the upper pole which is technically too small to accurately characterize but favored represent a renal cyst. Bladder is unremarkable. Stomach/Bowel: Stomach is unremarkable. No small bowel dilation. Normal appendix. Colonic diverticulosis without findings of diverticulitis. Vascular/Lymphatic: Aortic atherosclerosis. No enlarged abdominal or pelvic lymph nodes.  Reproductive: Curvilinear hyperdensities along the prosthetic urethra image 119/2, which were not prior examination appears similar to the curvilinear densities seen in the bladder on prior examination in are suspicious for retained Foley catheter balloon fragments. Other: No abdominopelvic ascites. Musculoskeletal: Stable appearance of the small sclerotic lesions of the bony pelvis and lumbar spine. Some of which resemble bone islands. Small sclerotic lesions along the anterior endplate of L2 and inferior endplate L3 are stable across multiple prior examinations. No new suspicious sclerotic lesions.  IMPRESSION: 1. Stable appearance of the known hepatic metastasis. No new suspicious hepatic lesions. 2. Similar minimal residual wall irregularity in the vicinity of prior bilobar lesion in the left mainstem bronchus. 3. Stable appearance of the several small sclerotic lesions of the bony pelvis and lumbar spine, which may be degenerative/benign but warrant continued attention on follow-up imaging. No new suspicious sclerotic lesions. 4. Curvilinear hyperdensities in the expected location of the prosthetic urethra were not seen on prior examination but do appear similar to the curvilinear density seen layering in the bladder and are suspicious for retained Foley catheter balloon fragments or possibly related to prior urological procedure. 5. Emphysema and aortic atherosclerosis. Aortic Atherosclerosis (ICD10-I70.0) and Emphysema (ICD10-J43.9). Electronically Signed   By: Dahlia Bailiff MD   On: 06/12/2020 10:07    ASSESSMENT: 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  PLAN:    1. 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 Patient underwent left upper lobe lobectomy in [place] on June 04, 2013. Final pathology results revealed a well differentiated neuroendocrine tumor with  positive bronchial margins. Patient did not receive adjuvant XRT or chemotherapy at that time.  His most recent Y 90 ablation occurred on April 28, 2018.  Patient's chromogranin a levels are elevated, but relatively stable ranging from 213-261. Although more recently this is trended up to 380.8. CT scan results from June 12, 2020 reviewed independently and report as above with no evidence of recurrent or progressive disease. Proceed with 120 mg subcutaneous lanreotide today. Return to clinic in 4 in 8 weeks for laboratory work and treatment only. Patient will then return to clinic in 12 weeks for further evaluation and continuation of treatment. Continue imaging every 6 months. 2.  Left mainstem endobronchial mass: Resolved.  Consistent with neuroendocrine tumor.  Patient underwent his third and final bronchoscopy with therapeutic cryoablation ablation on November 26, 2019.  Follow-up with pulmonology as indicated.   3.  Covid: Resolved.  4.  Urinary retention: Resolved. Foley has been removed.     Patient expressed understanding and was in agreement with this plan. He also understands that He can call clinic at any time with any questions, concerns, or complaints.   Cancer Staging Neuroendocrine carcinoma of lung [facility]) Staging form: Lung, AJCC 8th Edition - Clinical stage from 12/28/2016: Stage IVA (cT2a, cN0, cM1b) - Signed by Lloyd Huger, MD on 12/28/2016 Sites of metastasis: Liver   Lloyd Huger, MD   06/18/2020 6:37 AM

## 2020-06-17 ENCOUNTER — Encounter: Payer: Self-pay | Admitting: Oncology

## 2020-06-17 ENCOUNTER — Inpatient Hospital Stay (HOSPITAL_BASED_OUTPATIENT_CLINIC_OR_DEPARTMENT_OTHER): Payer: Medicare Other | Admitting: Oncology

## 2020-06-17 ENCOUNTER — Inpatient Hospital Stay: Payer: Medicare Other

## 2020-06-17 ENCOUNTER — Inpatient Hospital Stay: Payer: Medicare Other | Attending: Oncology

## 2020-06-17 VITALS — BP 155/79 | HR 55 | Temp 97.8°F | Resp 18 | Wt 157.2 lb

## 2020-06-17 DIAGNOSIS — C7A8 Other malignant neuroendocrine tumors: Secondary | ICD-10-CM | POA: Diagnosis not present

## 2020-06-17 DIAGNOSIS — C7B02 Secondary carcinoid tumors of liver: Secondary | ICD-10-CM | POA: Insufficient documentation

## 2020-06-17 DIAGNOSIS — Z902 Acquired absence of lung [part of]: Secondary | ICD-10-CM | POA: Diagnosis not present

## 2020-06-17 DIAGNOSIS — Z87891 Personal history of nicotine dependence: Secondary | ICD-10-CM | POA: Diagnosis not present

## 2020-06-17 DIAGNOSIS — C7B8 Other secondary neuroendocrine tumors: Secondary | ICD-10-CM | POA: Diagnosis not present

## 2020-06-17 DIAGNOSIS — E340 Carcinoid syndrome: Secondary | ICD-10-CM | POA: Diagnosis present

## 2020-06-17 DIAGNOSIS — C7A090 Malignant carcinoid tumor of the bronchus and lung: Secondary | ICD-10-CM | POA: Insufficient documentation

## 2020-06-17 DIAGNOSIS — Z8616 Personal history of COVID-19: Secondary | ICD-10-CM | POA: Diagnosis not present

## 2020-06-17 LAB — CBC WITH DIFFERENTIAL/PLATELET
Abs Immature Granulocytes: 0.04 10*3/uL (ref 0.00–0.07)
Basophils Absolute: 0.1 10*3/uL (ref 0.0–0.1)
Basophils Relative: 1 %
Eosinophils Absolute: 0.1 10*3/uL (ref 0.0–0.5)
Eosinophils Relative: 1 %
HCT: 47 % (ref 39.0–52.0)
Hemoglobin: 15.7 g/dL (ref 13.0–17.0)
Immature Granulocytes: 0 %
Lymphocytes Relative: 9 %
Lymphs Abs: 1 10*3/uL (ref 0.7–4.0)
MCH: 30.4 pg (ref 26.0–34.0)
MCHC: 33.4 g/dL (ref 30.0–36.0)
MCV: 90.9 fL (ref 80.0–100.0)
Monocytes Absolute: 1.6 10*3/uL — ABNORMAL HIGH (ref 0.1–1.0)
Monocytes Relative: 15 %
Neutro Abs: 7.8 10*3/uL — ABNORMAL HIGH (ref 1.7–7.7)
Neutrophils Relative %: 74 %
Platelets: 175 10*3/uL (ref 150–400)
RBC: 5.17 MIL/uL (ref 4.22–5.81)
RDW: 14.2 % (ref 11.5–15.5)
WBC: 10.7 10*3/uL — ABNORMAL HIGH (ref 4.0–10.5)
nRBC: 0 % (ref 0.0–0.2)

## 2020-06-17 LAB — COMPREHENSIVE METABOLIC PANEL
ALT: 27 U/L (ref 0–44)
AST: 26 U/L (ref 15–41)
Albumin: 4.2 g/dL (ref 3.5–5.0)
Alkaline Phosphatase: 124 U/L (ref 38–126)
Anion gap: 10 (ref 5–15)
BUN: 21 mg/dL (ref 8–23)
CO2: 26 mmol/L (ref 22–32)
Calcium: 9.6 mg/dL (ref 8.9–10.3)
Chloride: 103 mmol/L (ref 98–111)
Creatinine, Ser: 1.01 mg/dL (ref 0.61–1.24)
GFR, Estimated: >60 mL/min (ref >60)
Glucose, Bld: 90 mg/dL (ref 70–99)
Potassium: 4.4 mmol/L (ref 3.5–5.1)
Sodium: 139 mmol/L (ref 135–145)
Total Bilirubin: 0.6 mg/dL (ref 0.3–1.2)
Total Protein: 7.2 g/dL (ref 6.5–8.1)

## 2020-06-17 MED ORDER — LANREOTIDE ACETATE 120 MG/0.5ML ~~LOC~~ SOLN
120.0000 mg | Freq: Once | SUBCUTANEOUS | Status: AC
  Administered 2020-06-17: 120 mg via SUBCUTANEOUS
  Filled 2020-06-17: qty 120

## 2020-06-17 NOTE — Progress Notes (Signed)
Pt in for follow up, denies any difficulties or concerns.

## 2020-06-23 ENCOUNTER — Other Ambulatory Visit: Payer: Self-pay | Admitting: Internal Medicine

## 2020-06-23 LAB — CHROMOGRANIN A: Chromogranin A (ng/mL): 397.7 ng/mL — ABNORMAL HIGH (ref 0.0–101.8)

## 2020-06-23 NOTE — Telephone Encounter (Signed)
Medication Refill - Medication: losartan (COZAAR) 50 MG tablet     Preferred Pharmacy (with phone number or street name):  [place], [place] - [place] Phone:  [phone]  Fax:  [phone]       Agent: Please be advised that RX refills may take up to 3 business days. We ask that you follow-up with your pharmacy.

## 2020-06-23 NOTE — Telephone Encounter (Signed)
Requested medication (s) are due for refill today - yes  Requested medication (s) are on the active medication list -yes  Future visit scheduled -yes  Last refill: 6 months ago  Notes to clinic: Patient is requesting RF of Rx by outside provider.  Requested Prescriptions  Pending Prescriptions Disp Refills   losartan (COZAAR) 50 MG tablet 135 tablet 1    Sig: Take 1.5 tablets (75 mg total) by mouth daily.      Cardiovascular:  Angiotensin Receptor Blockers Failed - 06/23/2020  4:16 PM      Failed - Last BP in normal range    BP Readings from Last 1 Encounters:  06/17/20 (!) 155/79          Passed - Cr in normal range and within 180 days    Creat  Date Value Ref Range Status  02/12/2016 0.88 0.70 - 1.18 mg/dL Final    Comment:      For patients > or = 78 years of age: The upper reference limit for Creatinine is approximately 13% higher for people identified as African-American.      Creatinine, Ser  Date Value Ref Range Status  06/17/2020 1.01 0.61 - 1.24 mg/dL Final   Creatinine, Urine  Date Value Ref Range Status  07/03/2019 22.13 mg/dL Final    Comment:    Performed at [facility] [address], [place], [place] [address]          Passed - K in normal range and within 180 days    Potassium  Date Value Ref Range Status  06/17/2020 4.4 3.5 - 5.1 mmol/L Final  03/30/2013 3.4 (L) 3.5 - 5.1 mmol/L Final          Passed - Patient is not pregnant      Passed - Valid encounter within last 6 months    Recent Outpatient Visits           1 month ago Essential hypertension   [facility] Lebron Conners D, MD   5 months ago Need for influenza vaccination   [facility] Towanda Malkin, MD   8 months ago Essential hypertension   [facility] Lebron Conners D, MD   11 months ago Acute respiratory failure with hypoxia [facility])   [facility] Delsa Grana, PA-C    11 months ago No-show for appointment   [facility] Delsa Grana, PA-C       Future Appointments             In 1 week Gollan, Kathlene November, MD [place], [facility]   In 1 week  [place], [facility]                 Requested Prescriptions  Pending Prescriptions Disp Refills   losartan (COZAAR) 50 MG tablet 135 tablet 1    Sig: Take 1.5 tablets (75 mg total) by mouth daily.      Cardiovascular:  Angiotensin Receptor Blockers Failed - 06/23/2020  4:16 PM      Failed - Last BP in normal range    BP Readings from Last 1 Encounters:  06/17/20 (!) 155/79          Passed - Cr in normal range and within 180 days    Creat  Date Value Ref Range Status  02/12/2016 0.88 0.70 - 1.18 mg/dL Final    Comment:      For  patients > or = 78 years of age: The upper reference limit for Creatinine is approximately 13% higher for people identified as African-American.      Creatinine, Ser  Date Value Ref Range Status  06/17/2020 1.01 0.61 - 1.24 mg/dL Final   Creatinine, Urine  Date Value Ref Range Status  07/03/2019 22.13 mg/dL Final    Comment:    Performed at [facility] [address] [place], [place] [address]          Passed - K in normal range and within 180 days    Potassium  Date Value Ref Range Status  06/17/2020 4.4 3.5 - 5.1 mmol/L Final  03/30/2013 3.4 (L) 3.5 - 5.1 mmol/L Final          Passed - Patient is not pregnant      Passed - Valid encounter within last 6 months    Recent Outpatient Visits           1 month ago Essential hypertension   [place], MD   5 months ago Need for influenza vaccination   [place], MD   8 months ago Essential hypertension   [facility] Lebron Conners D, MD   11 months ago Acute respiratory failure with hypoxia [facility])    [facility] Delsa Grana, PA-C   11 months ago No-show for appointment   [facility] Delsa Grana, PA-C       Future Appointments             In 1 week Gollan, Kathlene November, MD [facility], [facility]   In 1 week  [place], [facility]

## 2020-06-24 MED ORDER — LOSARTAN POTASSIUM 50 MG PO TABS: 75.0000 mg | ORAL_TABLET | Freq: Every day | ORAL | 1 refills | Status: DC

## 2020-06-25 ENCOUNTER — Ambulatory Visit: Payer: Medicare Other | Admitting: Cardiovascular Disease

## 2020-06-27 NOTE — Progress Notes (Signed)
Agree with the details of the visit as noted by Brian Mack, NP.  C. Laura Stefani Baik, MD [place] 

## 2020-06-28 NOTE — Progress Notes (Signed)
Cardiology Office Note  Date:  06/30/2020   ID:  Janoah, Menna [birth year], MRN [id number]  PCP:  Towanda Malkin, MD (Inactive)   Chief Complaint  Patient presents with  . Other    Office visit-No new cardiac concerns    HPI:  Mr. Vinsant is a 80 year-old gentleman with long history of  smoking,   stopped 2001 lung cancer on the left, status post resection January 2015,  hypertension,  hyperlipidemia  hospital 03/29/2013 with tachycardia, shortness of breath, atrial flutter, lung CA  treated with rate controlling medications, and converted to NSR on arrival to the hospital with potassium 3.1, low magnesium felt secondary to his HCTZ. This was stopped. 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  Echo 12/10/2019 with LVEF 55-60%, mild LVH, grade 2 diastolic dysfunction, RV normal size and function, LA mild to moderately dilated, mild MR.  zio monitor NSR with minimum HR 40, avg 63, and max 164 bpm. He had 27 runs of SVT with longest 15 beats with rate of 98 bpm and fastest 10 beats with rate 164 bpm. he had raree PVC <1% and rare PACs 1.3%. He had rare ventricular bigeminy and trigeminy. No significant pauses. There were no patient triggered events  Active, exercising daily No chest pain or shortness of breath on exertion  Discussed his cancer treatments Still receiving shots, prior radioactive seeds and liver, had lung mets from lung  Lab work reviewed with him HBA1C 6.5 Total cholesterol 90, LDL in the 30s  EKG personally reviewed by myself on todays visit Shows sinus bradycardia rate 54 bpm no significant ST or T wave changes  Other past medical history reviewed  emergency room January 27, 2018 Atypical chest pain  Previous studies CT chest  Mild aortic atherosclerosis, arch,  descending aorta, Minimal CAD, calcification aortic valve (?left main)  Echocardiogram 03/29/2013 shows atrial flutter, ejection fraction 55-60%, otherwise normal study  CT scan of the chest shows 4.6 x 4.1 x 4.2 cm left hilar mass encasing the left pulmonary artery branches Recent lab work showing total cholesterol 171, HDL 39, LDL 86 prior  stress test 05/22/2013 showing no ischemia   PMH:   has a past medical history of Allergic rhinitis, Benign prostatic hypertrophy with lower urinary tract symptoms (LUTS), Chest pain, Diverticulitis, Enlarged prostate, Essential hypertension, Full dentures, H/O hypokalemia, atrial fibrillation without current medication, 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  PSH:    Past Surgical History:  Procedure Laterality Date  . BRONCHOSCOPY     04/06/2013  . CARDIOVASCULAR STRESS TEST  05/2013   a. No evidence of ischemia or infarct, EF 70%, no WMAs  . CATARACT EXTRACTION W/PHACO Left 07/14/2015   Procedure: CATARACT EXTRACTION PHACO AND INTRAOCULAR LENS PLACEMENT (IOC);  Surgeon: [name], MD;  Location: [facility];  Service: Ophthalmology;  Laterality: Left;  . CATARACT EXTRACTION W/PHACO Right 10/01/2015   Procedure: CATARACT EXTRACTION PHACO AND INTRAOCULAR LENS PLACEMENT (IOC);  Surgeon: [name], MD;  Location: [facility] [place];  Service: Ophthalmology;  Laterality: Right;  . COLONOSCOPY W/ POLYPECTOMY     bleed after-had to go to surgery  to stop bleeding via colonoscopy  . COLONOSCOPY WITH PROPOFOL N/A 11/19/2015   Procedure: COLONOSCOPY WITH PROPOFOL;  Surgeon: [name], MD;  Location: [facility];  Service: Endoscopy;  Laterality: N/A;  .  DUPUYTREN CONTRACTURE RELEASE  12/15/2011   Procedure: DUPUYTREN CONTRACTURE RELEASE;  Surgeon: [name], MD;  Location: [facility];  Service: Orthopedics;  Laterality: Left;  Fasciotomy left ring finger dupuytrens  . ELECTROMAGNETIC NAVIGATION BROCHOSCOPY Left 09/19/2019   Procedure: ELECTROMAGNETIC NAVIGATION BRONCHOSCOPY;  Surgeon: Tyler Pita, MD;  Location: [facility];  Service: Cardiopulmonary;  Laterality: Left;  . FASCIECTOMY Right 11/07/2018   Procedure: SEGMENTAL FASCIECTOMY RIGHT RING FINGER;  Surgeon: Daryll Brod, MD;  Location: [place];  Service: Orthopedics;  Laterality: Right;  AXILLARY BLOCK  . FLEXIBLE BRONCHOSCOPY Left 11/26/2019   Procedure: FLEXIBLE BRONCHOSCOPY WITH CRYO THERAPY;  Surgeon: Tyler Pita, MD;  Location: [facility];  Service: Cardiopulmonary;  Laterality: Left;  . IR ANGIOGRAM SELECTIVE EACH ADDITIONAL VESSEL  07/26/2017  . IR ANGIOGRAM SELECTIVE EACH ADDITIONAL VESSEL  07/26/2017  . IR ANGIOGRAM SELECTIVE EACH ADDITIONAL VESSEL  07/26/2017  . IR ANGIOGRAM SELECTIVE EACH ADDITIONAL VESSEL  07/26/2017  . IR ANGIOGRAM SELECTIVE EACH ADDITIONAL VESSEL  07/26/2017  . IR ANGIOGRAM SELECTIVE EACH ADDITIONAL VESSEL  08/11/2017  . IR ANGIOGRAM SELECTIVE EACH ADDITIONAL VESSEL  04/28/2018  . IR ANGIOGRAM VISCERAL SELECTIVE  07/26/2017  . IR ANGIOGRAM VISCERAL SELECTIVE  08/11/2017  . IR ANGIOGRAM VISCERAL SELECTIVE  04/28/2018  . IR EMBO ARTERIAL NOT HEMORR HEMANG INC GUIDE ROADMAPPING  07/26/2017  . IR EMBO TUMOR ORGAN ISCHEMIA INFARCT INC GUIDE ROADMAPPING  08/11/2017  . IR EMBO TUMOR ORGAN ISCHEMIA INFARCT INC GUIDE ROADMAPPING  04/28/2018  . IR RADIOLOGIST EVAL & MGMT  07/07/2017  . IR RADIOLOGIST EVAL & MGMT  09/07/2017  . IR RADIOLOGIST EVAL & MGMT  12/21/2017  . IR RADIOLOGIST EVAL & MGMT  03/22/2018  . IR RADIOLOGIST EVAL & MGMT  06/08/2018  . IR RADIOLOGIST EVAL & MGMT  11/01/2018  . IR RADIOLOGIST EVAL & MGMT  03/21/2019  . IR  RADIOLOGIST EVAL & MGMT  11/08/2019  . IR US GUIDE VASC ACCESS RIGHT  07/26/2017  . IR US GUIDE VASC ACCESS RIGHT  08/11/2017  . IR US GUIDE VASC ACCESS RIGHT  04/28/2018  . LUNG LOBECTOMY  06/10/13   upper left lung  . MULTIPLE TOOTH EXTRACTIONS    . REMOVAL RETAINED LENS Right 11/17/2015   Procedure: REMOVAL RETAINED LENS FROAGMENTS RIGHT EYE;  Surgeon: [name], MD;  Location: [facility];  Service: Ophthalmology;  Laterality: Right;  . TONSILLECTOMY    . UPPER GASTROINTESTINAL ENDOSCOPY  11-03-15   Dr [name]  . VASECTOMY    . VIDEO ASSISTED THORACOSCOPY (VATS)/WEDGE RESECTION Left 06/04/2013   Procedure: VIDEO ASSISTED THORACOSCOPY (VATS)/WEDGE RESECTION;  Surgeon: [name], MD;  Location: [facility];  Service: Thoracic;  Laterality: Left;  [name] VIDEO BRONCHOSCOPY N/A 06/04/2013   Procedure: VIDEO BRONCHOSCOPY;  Surgeon: [name], MD;  Location: [facility];  Service: Thoracic;  Laterality: N/A;    Current Outpatient Medications  Medication Sig Dispense Refill  . aspirin EC 81 MG tablet Take 81 mg by mouth daily.     [name] atorvastatin (LIPITOR) 40 MG tablet TAKE 1 TABLET BY MOUTH ONCE DAILY FOR CHOLESTEROL 90 tablet 1  . b complex vitamins capsule Take 1 capsule by mouth daily.    . cholecalciferol (VITAMIN D3) 25 MCG (1000 UNIT)  tablet Take 1,000 Units by mouth daily.    . Cinnamon 500 MG capsule Take 500 mg by mouth at bedtime.     . diphenhydrAMINE (BENADRYL) 25 mg capsule Take 25 mg by mouth every morning.     [name] DM-APAP-CPM (CORICIDIN HBP PO) Take 2 tablets by mouth daily as needed (allergies/cold symptoms).    . fluticasone furoate-vilanterol (BREO ELLIPTA) 100-25 MCG/INH AEPB Inhale 1 puff into the lungs daily. 28 each 11  . losartan (COZAAR) 100 MG tablet Take 1 tablet (100 mg total) by mouth daily. 90 tablet 1  . Multiple Vitamins-Minerals (MULTIVITAMIN WITH MINERALS) tablet Take 1 tablet by mouth daily.     Glory Rosebush VERIO test strip USE 1 STRIP TO CHECK GLUCOSE  ONCE DAILY 50 each 0  . polycarbophil (FIBERCON) 625 MG tablet Take 625 mg by mouth 2 (two) times daily.     . vitamin C (ASCORBIC ACID) 500 MG tablet Take 500 mg by mouth daily.     [name] zinc sulfate 220 (50 Zn) MG capsule Take 1 capsule (220 mg total) by mouth daily. 30 capsule 0   No current facility-administered medications for this visit.   Facility-Administered Medications Ordered in Other Visits  Medication Dose Route Frequency Provider Last Rate Last Admin  . lanreotide acetate (SOMATULINE DEPOT) injection 120 mg  120 mg Subcutaneous Once Lloyd Huger, MD        Allergies:   Tape   Social History:  The patient  reports that he quit smoking about 21 years ago. His smoking use included cigarettes. He has a 40.00 pack-year smoking history. He has never used smokeless tobacco. He reports that he does not drink alcohol and does not use drugs.   Family History:   family history includes Alzheimer's disease in his mother; Diabetes in his brother; Hyperlipidemia in his brother and sister; Hypertension in his brother and sister; Stroke in his mother.    Review of Systems: Review of Systems  Constitutional: Negative.   HENT: Negative.   Respiratory: Negative.   Cardiovascular: Negative.   Gastrointestinal: Negative.   Musculoskeletal: Negative.   Neurological: Positive for dizziness.  Psychiatric/Behavioral: Negative.   All other systems reviewed and are negative.   PHYSICAL EXAM: VS:  BP 140/70 (BP Location: Left Arm, Patient Position: Sitting, Cuff Size: Normal)   Pulse (!) 54   Ht _0  (1.626 m)   Wt 158 lb (71.7 kg)   SpO2 96%   BMI 27.12 kg/m  , BMI Body mass index is 27.12 kg/m. Constitutional:  oriented to person, place, and time. No distress.  HENT:  Head: Grossly normal Eyes:  no discharge. No scleral icterus.  Neck: No JVD, no carotid bruits  Cardiovascular: Regular rate and rhythm, no murmurs appreciated Pulmonary/Chest: Clear to auscultation bilaterally,  no wheezes or rails Abdominal: Soft.  no distension.  no tenderness.  Musculoskeletal: Normal range of motion Neurological:  normal muscle tone. Coordination normal. No atrophy Skin: Skin warm and dry Psychiatric: normal affect, pleasant   Recent Labs: 07/06/2019: B Natriuretic Peptide 158.7; Magnesium 2.0 06/17/2020: ALT 27; BUN 21; Creatinine, Ser 1.01; Hemoglobin 15.7; Platelets 175; Potassium 4.4; Sodium 139    Lipid Panel Lab Results  Component Value Date   CHOL 83 06/15/2019   HDL 32 (L) 06/15/2019   LDLCALC 42 06/15/2019   TRIG 44 06/15/2019      Wt Readings from Last 3 Encounters:  06/30/20 158 lb (71.7 kg)  06/17/20 157 lb 3.2 oz (71.3 kg)  05/23/20 158 lb 1.6 oz (71.7 kg)      ASSESSMENT AND PLAN:  Chest pain No recent episodes, no further work-up needed at this time  Essential hypertension - Plan: EKG 12-Lead Blood pressure stable, no medication changes made  Paroxysmal atrial flutter ([place]) - Plan: EKG 12-Lead Maintaining normal sinus rhythm Carvedilol previously held for orthostasis  Mixed hyperlipidemia Low numbers May need to decrease the Lipitor in half  Sinus bradycardia Carvedilol previously held for orthostasis Still with asymptomatic bradycardia  Smoking history Stopped 2001  S/P lobectomy of lung Metastasis to liver Followed by [facility]  Coronary artery disease involving native heart without angina pectoris, unspecified vessel or lesion type CT scan heart chest minimal coronary calcifications noted Does regular exercise, no angina  Aortic atherosclerosis ([facility]) Mild diffuse aortic atherosclerosis noted Lipids at goal   Total encounter time more than 25 minutes  Greater than 50% was spent in counseling and coordination of care with the patient   Disposition:   F/U  12 months   Orders Placed This Encounter  Procedures  . EKG 12-Lead     Signed, Esmond Plants, M.D., Ph.D. 06/30/2020  [place],  [place]

## 2020-06-30 ENCOUNTER — Ambulatory Visit: Payer: Medicare Other | Admitting: Cardiovascular Disease

## 2020-06-30 ENCOUNTER — Other Ambulatory Visit: Payer: Self-pay | Admitting: Family Medicine

## 2020-06-30 ENCOUNTER — Encounter: Payer: Self-pay | Admitting: Cardiovascular Disease

## 2020-06-30 ENCOUNTER — Other Ambulatory Visit: Payer: Self-pay

## 2020-06-30 VITALS — BP 140/70 | HR 54 | Ht 64.0 in | Wt 158.0 lb

## 2020-06-30 DIAGNOSIS — I2510 Atherosclerotic heart disease of native coronary artery without angina pectoris: Secondary | ICD-10-CM

## 2020-06-30 DIAGNOSIS — R001 Bradycardia, unspecified: Secondary | ICD-10-CM

## 2020-06-30 DIAGNOSIS — Z87891 Personal history of nicotine dependence: Secondary | ICD-10-CM

## 2020-06-30 DIAGNOSIS — I4892 Unspecified atrial flutter: Secondary | ICD-10-CM

## 2020-06-30 DIAGNOSIS — I700 Atherosclerosis of aorta: Secondary | ICD-10-CM

## 2020-06-30 DIAGNOSIS — I951 Orthostatic hypotension: Secondary | ICD-10-CM | POA: Diagnosis not present

## 2020-06-30 DIAGNOSIS — E782 Mixed hyperlipidemia: Secondary | ICD-10-CM

## 2020-06-30 MED ORDER — LOSARTAN POTASSIUM 100 MG PO TABS
100.0000 mg | ORAL_TABLET | Freq: Every day | ORAL | 1 refills | Status: DC
Start: 2020-06-30 — End: 2020-12-15

## 2020-06-30 NOTE — Patient Instructions (Signed)
Medication Instructions:  No changes  If you need a refill on your cardiac medications before your next appointment, please call your pharmacy.    Lab work: No new labs needed   If you have labs (blood work) drawn today and your tests are completely normal, you will receive your results only by: . MyChart Message (if you have MyChart) OR . A paper copy in the mail If you have any lab test that is abnormal or we need to change your treatment, we will call you to review the results.   Testing/Procedures: No new testing needed   Follow-Up: At [facility], you and your health needs are our priority.  As part of our continuing mission to provide you with exceptional heart care, we have created designated Provider Care Teams.  These Care Teams include your primary Cardiologist (physician) and Advanced Practice Providers (APPs -  Physician Assistants and Nurse Practitioners) who all work together to provide you with the care you need, when you need it.  . You will need a follow up appointment in 12 months  . Providers on your designated Care Team:   . Christopher Berge, NP . Ryan Dunn, PA-C . Jacquelyn Visser, PA-C  Any Other Special Instructions Will Be Listed Below (If Applicable).  COVID-19 Vaccine Information can be found at: [email or web address] For questions related to vaccine distribution or appointments, please email [email or web address] or call [phone].     

## 2020-07-01 ENCOUNTER — Encounter: Payer: Self-pay | Admitting: *Deleted

## 2020-07-01 ENCOUNTER — Ambulatory Visit
Admission: RE | Admit: 2020-07-01 | Discharge: 2020-07-01 | Disposition: A | Discharge status: Home / Self Care | Payer: Medicare Other | Source: Ambulatory Visit | Attending: Interventional Radiology | Admitting: Interventional Radiology

## 2020-07-01 DIAGNOSIS — C7B8 Other secondary neuroendocrine tumors: Secondary | ICD-10-CM

## 2020-07-01 DIAGNOSIS — C7A8 Other malignant neuroendocrine tumors: Secondary | ICD-10-CM

## 2020-07-01 HISTORY — PX: IR RADIOLOGIST EVAL & MGMT: IMG5224

## 2020-07-01 NOTE — Progress Notes (Signed)
Patient ID: Jason Malone, male   DOB: 09-07-1940, 80 y.o.   MRN: [id number]       Chief Complaint:  2 years status post Y 90 radioembolization for metastatic neuroendocrine tumor to the liver   Referring Physician(s): Finnegan  History of Present Illness: Jason Malone is a 80 y.o. male with stage IV metastatic neuroendocrine tumor to the liver.  He is just over 2 years status post right hepatic Y 90 embolization.  He also had a prior left hepatic Y 90 embolization.  Overall from a liver disease standpoint he remains stable.  Most recent CT 06/12/2020 demonstrates stable hepatic changes.  No new suspicious liver abnormality.  Overall he is symptom-free.  No new complaints.  Excellent functional status.  No recent illness or fevers.  Past Medical History:  Diagnosis Date   Allergic rhinitis    Benign prostatic hypertrophy with lower urinary tract symptoms (LUTS)    Chest pain    a. 05/2013 MV: EF 70%, no ischemia.   Diverticulitis    DIVERTICULOSIS   Enlarged prostate    Essential hypertension    CONTROLLED ON MEDS   Full dentures    upper and lower   H/O hypokalemia    Hx of atrial fibrillation without current medication    CARDIOLOGIST-DR GOLLAN/ [facility]   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 08/28/2016   Hyperlipemia    Hypomagnesemia    IFG (impaired fasting glucose)    Liver cancer (HCC)    Liver cancer ([place]) 12/08/2016   Liver mass, left lobe 08/28/2016   Probably hemangioma; will get MRI of liver, refer to GI   Lung cancer ([facility])    a. carcinoid, left lung, Stage 1b (T2a, N0, cM0);  b. 05/2013 s/p VATS & LULobectomy.   Monocytosis    Osteopenia    Paroxysmal atrial flutter (HCC)    a. 03/2013->no recurrence;  b. CHA2DS2VASc = 2-->not currently on anticoagulation;  c. 05/2012 Echo: EF 55-60%, normal RV.   Wears glasses     Past Surgical History:  Procedure Laterality Date   BRONCHOSCOPY     04/06/2013   CARDIOVASCULAR STRESS TEST   05/2013   a. No evidence of ischemia or infarct, EF 70%, no WMAs   CATARACT EXTRACTION W/PHACO Left 07/14/2015   Procedure: CATARACT EXTRACTION PHACO AND INTRAOCULAR LENS PLACEMENT (IOC);  Surgeon: [name], MD;  Location: [place];  Service: Ophthalmology;  Laterality: Left;   CATARACT EXTRACTION W/PHACO Right 10/01/2015   Procedure: CATARACT EXTRACTION PHACO AND INTRAOCULAR LENS PLACEMENT (IOC);  Surgeon: [name], MD;  Location: [place];  Service: Ophthalmology;  Laterality: Right;   COLONOSCOPY W/ POLYPECTOMY     bleed after-had to go to surgery to stop bleeding via colonoscopy   COLONOSCOPY WITH PROPOFOL N/A 11/19/2015   Procedure: COLONOSCOPY WITH PROPOFOL;  Surgeon: [name], MD;  Location: [facility] ENDOSCOPY;  Service: Endoscopy;  Laterality: N/A;   DUPUYTREN CONTRACTURE RELEASE  12/15/2011   Procedure: DUPUYTREN CONTRACTURE RELEASE;  Surgeon: [name], MD;  Location: [place];  Service: Orthopedics;  Laterality: Left;  Fasciotomy left ring finger dupuytrens   ELECTROMAGNETIC NAVIGATION BROCHOSCOPY Left 09/19/2019   Procedure: ELECTROMAGNETIC NAVIGATION BRONCHOSCOPY;  Surgeon: Tyler Pita, MD;  Location: [facility];  Service: Cardiopulmonary;  Laterality: Left;   FASCIECTOMY Right 11/07/2018   Procedure: SEGMENTAL FASCIECTOMY RIGHT RING FINGER;  Surgeon: Daryll Brod, MD;  Location: [place];  Service: Orthopedics;  Laterality: Right;  AXILLARY BLOCK   FLEXIBLE BRONCHOSCOPY Left 11/26/2019   Procedure: FLEXIBLE BRONCHOSCOPY WITH CRYO THERAPY;  Surgeon: Tyler Pita, MD;  Location: [facility];  Service: Cardiopulmonary;  Laterality: Left;   IR ANGIOGRAM SELECTIVE EACH ADDITIONAL VESSEL  07/26/2017   IR ANGIOGRAM SELECTIVE EACH ADDITIONAL VESSEL  07/26/2017   IR ANGIOGRAM SELECTIVE EACH ADDITIONAL VESSEL  07/26/2017   IR ANGIOGRAM SELECTIVE EACH ADDITIONAL VESSEL  07/26/2017   IR ANGIOGRAM  SELECTIVE EACH ADDITIONAL VESSEL  07/26/2017   IR ANGIOGRAM SELECTIVE EACH ADDITIONAL VESSEL  08/11/2017   IR ANGIOGRAM SELECTIVE EACH ADDITIONAL VESSEL  04/28/2018   IR ANGIOGRAM VISCERAL SELECTIVE  07/26/2017   IR ANGIOGRAM VISCERAL SELECTIVE  08/11/2017   IR ANGIOGRAM VISCERAL SELECTIVE  04/28/2018   IR EMBO ARTERIAL NOT HEMORR HEMANG INC GUIDE ROADMAPPING  07/26/2017   IR EMBO TUMOR ORGAN ISCHEMIA INFARCT INC GUIDE ROADMAPPING  08/11/2017   IR EMBO TUMOR ORGAN ISCHEMIA INFARCT INC GUIDE ROADMAPPING  04/28/2018   IR RADIOLOGIST EVAL & MGMT  07/07/2017   IR RADIOLOGIST EVAL & MGMT  09/07/2017   IR RADIOLOGIST EVAL & MGMT  12/21/2017   IR RADIOLOGIST EVAL & MGMT  03/22/2018   IR RADIOLOGIST EVAL & MGMT  06/08/2018   IR RADIOLOGIST EVAL & MGMT  11/01/2018   IR RADIOLOGIST EVAL & MGMT  03/21/2019   IR RADIOLOGIST EVAL & MGMT  11/08/2019   IR US GUIDE VASC ACCESS RIGHT  07/26/2017   IR US GUIDE VASC ACCESS RIGHT  08/11/2017   IR US GUIDE VASC ACCESS RIGHT  04/28/2018   LUNG LOBECTOMY  06/10/13   upper left lung   MULTIPLE TOOTH EXTRACTIONS     REMOVAL RETAINED LENS Right 11/17/2015   Procedure: REMOVAL RETAINED LENS FROAGMENTS RIGHT EYE;  Surgeon: [name], MD;  Location: [place];  Service: Ophthalmology;  Laterality: Right;   TONSILLECTOMY     UPPER GASTROINTESTINAL ENDOSCOPY  11-03-15   Dr [name]   VASECTOMY     VIDEO ASSISTED THORACOSCOPY (VATS)/WEDGE RESECTION Left 06/04/2013   Procedure: VIDEO ASSISTED THORACOSCOPY (VATS)/WEDGE RESECTION;  Surgeon: [name], MD;  Location: [place];  Service: Thoracic;  Laterality: Left;   VIDEO BRONCHOSCOPY N/A 06/04/2013   Procedure: VIDEO BRONCHOSCOPY;  Surgeon: [name], MD;  Location: [facility] OR;  Service: Thoracic;  Laterality: N/A;    Allergies: Tape  Medications: Prior to Admission medications   Medication Sig Start Date End Date Taking? Authorizing Provider  aspirin EC 81 MG tablet Take 81 mg by  mouth daily.     [provider]  atorvastatin (LIPITOR) 40 MG tablet TAKE 1 TABLET BY MOUTH ONCE DAILY FOR CHOLESTEROL 05/12/20   Towanda Malkin, MD  b complex vitamins capsule Take 1 capsule by mouth daily.    [provider]  cholecalciferol (VITAMIN D3) 25 MCG (1000 UNIT) tablet Take 1,000 Units by mouth daily.    [provider]  Cinnamon 500 MG capsule Take 500 mg by mouth at bedtime.     [provider]  diphenhydrAMINE (BENADRYL) 25 mg capsule Take 25 mg by mouth every morning.     [provider]  DM-APAP-CPM (CORICIDIN HBP PO) Take 2 tablets by mouth daily as needed (allergies/cold symptoms).    [provider]  fluticasone furoate-vilanterol (BREO ELLIPTA) 100-25 MCG/INH AEPB Inhale 1 puff into the lungs daily. 12/17/19   Tyler Pita, MD  losartan (COZAAR) 100 MG tablet Take 1 tablet (100 mg total) by mouth daily. 06/30/20 09/28/20  Myles Gip, DO  Multiple Vitamins-Minerals (MULTIVITAMIN WITH MINERALS) tablet Take 1 tablet by mouth daily.     [provider]  [facility] VERIO test strip USE 1 STRIP TO CHECK GLUCOSE ONCE DAILY 05/12/20   Towanda Malkin, MD  polycarbophil (FIBERCON) 625 MG tablet Take 625 mg by mouth 2 (two) times daily.     [provider]  vitamin C (ASCORBIC ACID) 500 MG tablet Take 500 mg by mouth daily.     [provider]  zinc sulfate 220 (50 Zn) MG capsule Take 1 capsule (220 mg total) by mouth daily. 06/18/19   Allie Bossier, MD     Family History  Problem Relation Age of Onset   Stroke Mother    Alzheimer's disease Mother    Hypertension Sister    Hyperlipidemia Sister    Hypertension Brother    Hyperlipidemia Brother    Diabetes Brother     Social History   Socioeconomic History   Marital status: Married    Spouse name: [name]   Number of children: 3   Years of education: Not on file   Highest education level: High school graduate   Occupational History   Not on file  Tobacco Use   Smoking status: Former Smoker    Packs/day: 1.00    Years: 40.00    Pack years: 40.00    Types: Cigarettes    Quit date: 12/09/1998    Years since quitting: 21.5   Smokeless tobacco: Never Used  Scientific laboratory technician Use: Never used  Substance and Sexual Activity   Alcohol use: No   Drug use: No   Sexual activity: Yes    Partners: Female  Other Topics Concern   Not on file  Social History Narrative   Not on file   Social Determinants of Health   Financial Resource Strain: Not on file  Food Insecurity: Not on file  Transportation Needs: Not on file  Physical Activity: Not on file  Stress: Not on file  Social Connections: Not on file    ECOG Status: 1 - Symptomatic but completely ambulatory  Review of Systems  Review of Systems: A 12 point ROS discussed and pertinent positives are indicated in the HPI above.  All other systems are negative.  Physical Exam No direct physical exam was performed, telephone health visit only today because of Covid pandemic   Vital Signs: There were no vitals taken for this visit.  Imaging: CT CHEST ABDOMEN PELVIS W CONTRAST  Result Date: 06/12/2020 CLINICAL DATA:  Restaging of stage IVa neuroendocrine tumor of the lung metastatic to the liver. Prior left upper lobectomy May 25, 2013. Most recent Y 90 ablation April 28, 2018. Left mainstem bronchus endobronchial mass consistent with neuroendocrine tumor, with ablation therapies. Lanreotide therapy. EXAM: CT CHEST, ABDOMEN, AND PELVIS WITH CONTRAST TECHNIQUE: Multidetector CT imaging of the chest, abdomen and pelvis was performed following the standard protocol during bolus administration of intravenous contrast. CONTRAST:  157m OMNIPAQUE IOHEXOL 300 MG/ML  SOLN COMPARISON:  MRI abdomen December 21, 2019 and CT chest abdomen pelvis November 02 2019. FINDINGS: CT CHEST FINDINGS Cardiovascular: Normal size heart. No pericardial  effusion. Aortic and branch vessel atherosclerosis. No central pulmonary embolus Mediastinum/Nodes: No mediastinal, hilar or axillary lymphadenopathy. Thyroid unremarkable. Esophagus unremarkable trachea central airways are patent. There is similar minimal residual wall irregularity in the vicinity of prior bilobar lesions in the left mainstem bronchus (series 2, image 29). Lungs/Pleura: Emphysema with similar right greater  than left scattered scarring with subpleural reticulation and unchanged right-sided ground-glass. Stable changes of left lobectomy. Right upper lobe calcified. No new suspicious pulmonary nodules or masses. Musculoskeletal: Remote fractures the anterior right fifth and sixth ribs, unchanged. Mild sclerosis anteriorly in the right sixth rib is unchanged from prior examinations. Multilevel degenerative changes spine. Sclerotic rim 1 cm lesion in the T11 vertebral body eccentric to the left similar appearance since 08/15/2019. Multiple Schmorl's nodes of the thoracic spine. CT ABDOMEN PELVIS FINDINGS Hepatobiliary: Similar size of the partially exophytic segment III hepatic metastasis which measures 1.9 cm previously 2.0 cm (series 2, image 54). Unchanged size of the 3 mm hypodense lesion in the right hepatic lobe (series 2, image 62). No new suspicious hepatic lesions. Gallbladder is decompressed. No biliary ductal dilatation. Pancreas: Unremarkable Spleen: Unchanged size of the 1.2 cm hypodense lesion in the spleen (series 2, image 55), which again is stable across multiple prior examinations dating back to at least 03/15/2019 and is favored to be benign. Adrenals/Urinary Tract: Left renal atrophy. Scarring in the right midpole unchanged. Unchanged subcentimeter hypodense left renal lesion in the upper pole which is technically too small to accurately characterize but favored represent a renal cyst. Bladder is unremarkable. Stomach/Bowel: Stomach is unremarkable. No small bowel dilation. Normal  appendix. Colonic diverticulosis without findings of diverticulitis. Vascular/Lymphatic: Aortic atherosclerosis. No enlarged abdominal or pelvic lymph nodes. Reproductive: Curvilinear hyperdensities along the prosthetic urethra image 119/2, which were not prior examination appears similar to the curvilinear densities seen in the bladder on prior examination in are suspicious for retained Foley catheter balloon fragments. Other: No abdominopelvic ascites. Musculoskeletal: Stable appearance of the small sclerotic lesions of the bony pelvis and lumbar spine. Some of which resemble bone islands. Small sclerotic lesions along the anterior endplate of L2 and inferior endplate L3 are stable across multiple prior examinations. No new suspicious sclerotic lesions. IMPRESSION: 1. Stable appearance of the known hepatic metastasis. No new suspicious hepatic lesions. 2. Similar minimal residual wall irregularity in the vicinity of prior bilobar lesion in the left mainstem bronchus. 3. Stable appearance of the several small sclerotic lesions of the bony pelvis and lumbar spine, which may be degenerative/benign but warrant continued attention on follow-up imaging. No new suspicious sclerotic lesions. 4. Curvilinear hyperdensities in the expected location of the prosthetic urethra were not seen on prior examination but do appear similar to the curvilinear density seen layering in the bladder and are suspicious for retained Foley catheter balloon fragments or possibly related to prior urological procedure. 5. Emphysema and aortic atherosclerosis. Aortic Atherosclerosis (ICD10-I70.0) and Emphysema (ICD10-J43.9). Electronically Signed   By: Dahlia Bailiff MD   On: 06/12/2020 10:07    Labs:  CBC: Recent Labs    03/25/20 1019 04/22/20 1042 05/20/20 1033 06/17/20 1035  WBC 10.0 11.4* 10.9* 10.7*  HGB 15.9 16.0 15.8 15.7  HCT 47.5 48.6 47.8 47.0  PLT 163 191 172 175    COAGS: Recent Labs    07/03/19 0139  INR 1.1   APTT 30    BMP: Recent Labs    11/06/19 1257 12/04/19 0959 12/21/19 1014 01/29/20 1106 02/26/20 1105 03/25/20 1019 04/22/20 1042 05/20/20 1033 06/17/20 1035  NA 135 138 141 140   < > 140 138 138 139  K 4.4 4.5 4.8 4.3   < > 4.4 4.8 4.6 4.4  CL 102 103 104 105   < > 104 101 102 103  CO2 _0 < > 27 28  28 26  GLUCOSE 176* 163* 105* 122*   < > 121* 110* 108* 90  BUN _0 < > _1 CALCIUM 8.8* 9.3 9.8 9.3   < > 9.8 9.9 9.6 9.6  CREATININE 1.22 1.02 0.97 1.00   < > 1.01 0.92 0.91 1.01  GFRNONAA 57* >60 >60 >60   < > >60 >60 >60 >60  GFRAA >60 >60 >60 >60  --   --   --   --   --    < > = values in this interval not displayed.    LIVER FUNCTION TESTS: Recent Labs    03/25/20 1019 04/22/20 1042 05/20/20 1033 06/17/20 1035  BILITOT 0.7 0.8 0.8 0.6  AST _2 ALT 36 38 25 27  ALKPHOS 115 129* 144* 124  PROT 7.0 7.2 7.3 7.2  ALBUMIN 4.1 4.3 3.9 4.2    TUMOR MARKERS: No results for input(s): AFPTM, CEA, CA199, CHROMGRNA in the last 8760 hours.  Assessment and Plan:  Known stage IV metastatic neuroendocrine tumor to the liver.  He is status post whole liver Y 90 embolization.  Last procedure was performed December 2019 just over 2 years ago.  Surveillance imaging demonstrates a stable hepatic changes with no new liver abnormality or extrahepatic disease.  Stable cirrhotic changes of the liver by CT.  Plan: Stable hepatic changes following Y 90 embolization.  Last treatment December 2019.  He remains on monthly Sandostatin injections and is closely followed by Dr. Grayland Ormond in [place].  Plan: Continue surveillance imaging with CT at 18-monthintervals.   Electronically Signed: MGreggory Keen2/22/2022, 10:16 AM   I spent a total of    25 Minutes in remote  clinical consultation, greater than 50% of which was counseling/coordinating care for this patient with metastatic neuroendocrine tumor..    Visit type: Audio only (telephone).  Audio (no video) only due to patient's lack of internet/smartphone capability. [facility] This visit type was conducted due to national recommendations for restrictions regarding the COVID-19 Pandemic (e.g. social distancing).  This format is felt to be most appropriate for this patient at this time.  All issues noted in this document were discussed and addressed.

## 2020-07-02 ENCOUNTER — Other Ambulatory Visit: Payer: Self-pay

## 2020-07-02 DIAGNOSIS — E119 Type 2 diabetes mellitus without complications: Secondary | ICD-10-CM

## 2020-07-07 ENCOUNTER — Other Ambulatory Visit: Payer: Self-pay

## 2020-07-07 DIAGNOSIS — E119 Type 2 diabetes mellitus without complications: Secondary | ICD-10-CM

## 2020-07-07 MED ORDER — ONETOUCH VERIO VI STRP: ORAL_STRIP | 3 refills | Status: DC

## 2020-07-15 ENCOUNTER — Inpatient Hospital Stay: Payer: Medicare Other | Attending: Oncology

## 2020-07-15 ENCOUNTER — Inpatient Hospital Stay: Payer: Medicare Other

## 2020-07-15 DIAGNOSIS — C7A8 Other malignant neuroendocrine tumors: Secondary | ICD-10-CM

## 2020-07-15 DIAGNOSIS — Z79899 Other long term (current) drug therapy: Secondary | ICD-10-CM | POA: Diagnosis not present

## 2020-07-15 DIAGNOSIS — C7B02 Secondary carcinoid tumors of liver: Secondary | ICD-10-CM | POA: Insufficient documentation

## 2020-07-15 DIAGNOSIS — Z87891 Personal history of nicotine dependence: Secondary | ICD-10-CM | POA: Diagnosis not present

## 2020-07-15 DIAGNOSIS — Z7982 Long term (current) use of aspirin: Secondary | ICD-10-CM | POA: Insufficient documentation

## 2020-07-15 DIAGNOSIS — C7A090 Malignant carcinoid tumor of the bronchus and lung: Secondary | ICD-10-CM | POA: Insufficient documentation

## 2020-07-15 LAB — CBC WITH DIFFERENTIAL/PLATELET
Abs Immature Granulocytes: 0.03 10*3/uL (ref 0.00–0.07)
Basophils Absolute: 0.1 10*3/uL (ref 0.0–0.1)
Basophils Relative: 1 %
Eosinophils Absolute: 0.1 10*3/uL (ref 0.0–0.5)
Eosinophils Relative: 1 %
HCT: 47.4 % (ref 39.0–52.0)
Hemoglobin: 15.4 g/dL (ref 13.0–17.0)
Immature Granulocytes: 0 %
Lymphocytes Relative: 9 %
Lymphs Abs: 1 10*3/uL (ref 0.7–4.0)
MCH: 30 pg (ref 26.0–34.0)
MCHC: 32.5 g/dL (ref 30.0–36.0)
MCV: 92.4 fL (ref 80.0–100.0)
Monocytes Absolute: 1.7 10*3/uL — ABNORMAL HIGH (ref 0.1–1.0)
Monocytes Relative: 16 %
Neutro Abs: 8 10*3/uL — ABNORMAL HIGH (ref 1.7–7.7)
Neutrophils Relative %: 73 %
Platelets: 167 10*3/uL (ref 150–400)
RBC: 5.13 MIL/uL (ref 4.22–5.81)
RDW: 14.4 % (ref 11.5–15.5)
WBC: 11 10*3/uL — ABNORMAL HIGH (ref 4.0–10.5)
nRBC: 0 % (ref 0.0–0.2)

## 2020-07-15 LAB — COMPREHENSIVE METABOLIC PANEL
ALT: 23 U/L (ref 0–44)
AST: 23 U/L (ref 15–41)
Albumin: 4.1 g/dL (ref 3.5–5.0)
Alkaline Phosphatase: 111 U/L (ref 38–126)
Anion gap: 9 (ref 5–15)
BUN: 20 mg/dL (ref 8–23)
CO2: 26 mmol/L (ref 22–32)
Calcium: 9.5 mg/dL (ref 8.9–10.3)
Chloride: 105 mmol/L (ref 98–111)
Creatinine, Ser: 0.91 mg/dL (ref 0.61–1.24)
GFR, Estimated: >60 mL/min (ref >60)
Glucose, Bld: 88 mg/dL (ref 70–99)
Potassium: 4.8 mmol/L (ref 3.5–5.1)
Sodium: 140 mmol/L (ref 135–145)
Total Bilirubin: 0.9 mg/dL (ref 0.3–1.2)
Total Protein: 6.9 g/dL (ref 6.5–8.1)

## 2020-07-15 MED ORDER — LANREOTIDE ACETATE 120 MG/0.5ML ~~LOC~~ SOLN
120.0000 mg | Freq: Once | SUBCUTANEOUS | Status: AC
  Administered 2020-07-15: 120 mg via SUBCUTANEOUS
  Filled 2020-07-15: qty 120

## 2020-07-17 LAB — CHROMOGRANIN A: Chromogranin A (ng/mL): 431 ng/mL — ABNORMAL HIGH (ref 0.0–101.8)

## 2020-08-12 ENCOUNTER — Inpatient Hospital Stay: Payer: Medicare Other | Attending: Oncology

## 2020-08-12 ENCOUNTER — Inpatient Hospital Stay: Payer: Medicare Other

## 2020-08-12 VITALS — BP 164/78 | HR 81

## 2020-08-12 DIAGNOSIS — C7B02 Secondary carcinoid tumors of liver: Secondary | ICD-10-CM | POA: Insufficient documentation

## 2020-08-12 DIAGNOSIS — C7A090 Malignant carcinoid tumor of the bronchus and lung: Secondary | ICD-10-CM | POA: Insufficient documentation

## 2020-08-12 DIAGNOSIS — C7A8 Other malignant neuroendocrine tumors: Secondary | ICD-10-CM

## 2020-08-12 LAB — CBC WITH DIFFERENTIAL/PLATELET
Abs Immature Granulocytes: 0.06 10*3/uL (ref 0.00–0.07)
Basophils Absolute: 0.1 10*3/uL (ref 0.0–0.1)
Basophils Relative: 1 %
Eosinophils Absolute: 0.1 10*3/uL (ref 0.0–0.5)
Eosinophils Relative: 1 %
HCT: 48 % (ref 39.0–52.0)
Hemoglobin: 15.7 g/dL (ref 13.0–17.0)
Immature Granulocytes: 1 %
Lymphocytes Relative: 8 %
Lymphs Abs: 1 10*3/uL (ref 0.7–4.0)
MCH: 30.3 pg (ref 26.0–34.0)
MCHC: 32.7 g/dL (ref 30.0–36.0)
MCV: 92.7 fL (ref 80.0–100.0)
Monocytes Absolute: 1.9 10*3/uL — ABNORMAL HIGH (ref 0.1–1.0)
Monocytes Relative: 15 %
Neutro Abs: 9.6 10*3/uL — ABNORMAL HIGH (ref 1.7–7.7)
Neutrophils Relative %: 74 %
Platelets: 170 10*3/uL (ref 150–400)
RBC: 5.18 MIL/uL (ref 4.22–5.81)
RDW: 14.3 % (ref 11.5–15.5)
WBC: 12.8 10*3/uL — ABNORMAL HIGH (ref 4.0–10.5)
nRBC: 0 % (ref 0.0–0.2)

## 2020-08-12 LAB — COMPREHENSIVE METABOLIC PANEL
ALT: 20 U/L (ref 0–44)
AST: 22 U/L (ref 15–41)
Albumin: 4.3 g/dL (ref 3.5–5.0)
Alkaline Phosphatase: 117 U/L (ref 38–126)
Anion gap: 9 (ref 5–15)
BUN: 22 mg/dL (ref 8–23)
CO2: 26 mmol/L (ref 22–32)
Calcium: 9.3 mg/dL (ref 8.9–10.3)
Chloride: 104 mmol/L (ref 98–111)
Creatinine, Ser: 0.99 mg/dL (ref 0.61–1.24)
GFR, Estimated: >60 mL/min (ref >60)
Glucose, Bld: 83 mg/dL (ref 70–99)
Potassium: 4.2 mmol/L (ref 3.5–5.1)
Sodium: 139 mmol/L (ref 135–145)
Total Bilirubin: 0.8 mg/dL (ref 0.3–1.2)
Total Protein: 7 g/dL (ref 6.5–8.1)

## 2020-08-12 MED ORDER — LANREOTIDE ACETATE 120 MG/0.5ML ~~LOC~~ SOLN
120.0000 mg | Freq: Once | SUBCUTANEOUS | Status: AC
  Administered 2020-08-12: 120 mg via SUBCUTANEOUS
  Filled 2020-08-12: qty 120

## 2020-08-13 LAB — CHROMOGRANIN A: Chromogranin A (ng/mL): 527.2 ng/mL — ABNORMAL HIGH (ref 0.0–101.8)

## 2020-09-06 NOTE — Progress Notes (Signed)
[place]  [phone] [phone]  ID: Jason Malone OB: [birth year]  MR#: [id number]  ASN#:[id number]  Patient Care Team: Towanda Malkin, MD as PCP - General (Internal Medicine) Minna Merritts, MD as PCP - Cardiology (Cardiology) [name], Forest Gleason, MD (General Surgery) Nestor Lewandowsky, MD as Referring Physician (Cardiothoracic Surgery) Minna Merritts, MD as Consulting Physician (Cardiology) [name], MD (Inactive) as Consulting Physician (Cardiothoracic Surgery) Lloyd Huger, MD as Consulting Physician (Oncology) Sanda Klein Satira Anis, MD as Attending Physician (Family Medicine)   CHIEF COMPLAINT: 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  INTERVAL HISTORY: Patient returns to clinic today for further evaluation and continuation of lanreotide.  He currently feels well and is asymptomatic.  He denies any pain. He denies any weakness or fatigue today.  He has no neurologic complaints.  He has a good appetite and denies weight loss.  He denies any chest pain, shortness of breath, cough, or hemoptysis.  He denies any abdominal pain.  He has no nausea, vomiting, constipation, or diarrhea.  He has no urinary complaints.  Patient offers no specific complaints today.  REVIEW OF SYSTEMS:   Review of Systems  Constitutional: Negative.  Negative for fever, malaise/fatigue and weight loss.  Respiratory: Negative.  Negative for cough, hemoptysis and shortness of breath.   Cardiovascular: Negative.  Negative for chest pain and leg swelling.  Gastrointestinal: Negative.  Negative for abdominal pain, diarrhea, nausea and vomiting.  Genitourinary: Negative.  Negative for dysuria.  Musculoskeletal: Negative.  Negative for back pain.  Skin: Negative.  Negative for rash.  Neurological: Negative.  Negative for dizziness, sensory change, focal weakness and weakness.  Psychiatric/Behavioral: Negative.  The patient is  not nervous/anxious.     As per HPI. Otherwise, a complete review of systems is negative.  PAST MEDICAL HISTORY: Past Medical History:  Diagnosis Date  . Allergic rhinitis   . Benign prostatic hypertrophy with lower urinary tract symptoms (LUTS)   . Chest pain    a. 05/2013 MV: EF 70%, no ischemia.  . Diverticulitis    DIVERTICULOSIS  . Enlarged prostate   . Essential hypertension    CONTROLLED ON MEDS  . Full dentures    upper and lower  . H/O hypokalemia   . Hx of atrial fibrillation without current medication    CARDIOLOGIST-DR [facility]  . 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 Hyperlipemia   . Hypomagnesemia   . IFG (impaired fasting glucose)   . Liver cancer ([place])   . Liver cancer ([place]) 12/08/2016  . Liver mass, left lobe 08/28/2016   Probably hemangioma; will get MRI of liver, refer to GI  . Lung cancer ([place])    a. carcinoid, left lung, Stage 1b (T2a, N0, cM0);  b. 05/2013 s/p VATS & LULobectomy.  . Monocytosis   . Osteopenia   . Paroxysmal atrial flutter (HCC)    a. 03/2013->no recurrence;  b. CHA2DS2VASc = 2-->not currently on anticoagulation;  c. 05/2012 Echo: EF 55-60%, normal RV.  [name] Wears glasses     PAST SURGICAL HISTORY: Past Surgical History:  Procedure Laterality Date  . BRONCHOSCOPY     04/06/2013  . CARDIOVASCULAR STRESS TEST  05/2013   a. No evidence of ischemia or infarct, EF 70%, no WMAs  . CATARACT EXTRACTION W/PHACO Left 07/14/2015   Procedure: CATARACT EXTRACTION PHACO AND INTRAOCULAR LENS PLACEMENT (IOC);  Surgeon: [name], MD;  Location: [place];  Service: Ophthalmology;  Laterality: Left;  . CATARACT EXTRACTION W/PHACO Right 10/01/2015   Procedure: CATARACT EXTRACTION PHACO AND INTRAOCULAR LENS PLACEMENT (IOC);  Surgeon: [name], MD;  Location: [place];  Service: Ophthalmology;  Laterality: Right;  . COLONOSCOPY W/ POLYPECTOMY     bleed after-had to go to surgery to stop  bleeding via colonoscopy  . COLONOSCOPY WITH PROPOFOL N/A 11/19/2015   Procedure: COLONOSCOPY WITH PROPOFOL;  Surgeon: [name], MD;  Location: [facility] ENDOSCOPY;  Service: Endoscopy;  Laterality: N/A;  . DUPUYTREN CONTRACTURE RELEASE  12/15/2011   Procedure: DUPUYTREN CONTRACTURE RELEASE;  Surgeon: [name], MD;  Location: [place];  Service: Orthopedics;  Laterality: Left;  Fasciotomy left ring finger dupuytrens  . ELECTROMAGNETIC NAVIGATION BROCHOSCOPY Left 09/19/2019   Procedure: ELECTROMAGNETIC NAVIGATION BRONCHOSCOPY;  Surgeon: Tyler Pita, MD;  Location: [facility];  Service: Cardiopulmonary;  Laterality: Left;  . FASCIECTOMY Right 11/07/2018   Procedure: SEGMENTAL FASCIECTOMY RIGHT RING FINGER;  Surgeon: Daryll Brod, MD;  Location: [place];  Service: Orthopedics;  Laterality: Right;  AXILLARY BLOCK  . FLEXIBLE BRONCHOSCOPY Left 11/26/2019   Procedure: FLEXIBLE BRONCHOSCOPY WITH CRYO THERAPY;  Surgeon: Tyler Pita, MD;  Location: [facility];  Service: Cardiopulmonary;  Laterality: Left;  . IR ANGIOGRAM SELECTIVE EACH ADDITIONAL VESSEL  07/26/2017  . IR ANGIOGRAM SELECTIVE EACH ADDITIONAL VESSEL  07/26/2017  . IR ANGIOGRAM SELECTIVE EACH ADDITIONAL VESSEL  07/26/2017  . IR ANGIOGRAM SELECTIVE EACH ADDITIONAL VESSEL  07/26/2017  . IR ANGIOGRAM SELECTIVE EACH ADDITIONAL VESSEL  07/26/2017  . IR ANGIOGRAM SELECTIVE EACH ADDITIONAL VESSEL  08/11/2017  . IR ANGIOGRAM SELECTIVE EACH ADDITIONAL VESSEL  04/28/2018  . IR ANGIOGRAM VISCERAL SELECTIVE  07/26/2017  . IR ANGIOGRAM VISCERAL SELECTIVE  08/11/2017  . IR ANGIOGRAM VISCERAL SELECTIVE  04/28/2018  . IR EMBO ARTERIAL NOT HEMORR HEMANG INC GUIDE ROADMAPPING  07/26/2017  . IR EMBO TUMOR ORGAN ISCHEMIA INFARCT INC GUIDE ROADMAPPING  08/11/2017  . IR EMBO TUMOR ORGAN ISCHEMIA INFARCT INC GUIDE ROADMAPPING  04/28/2018  . IR RADIOLOGIST EVAL & MGMT  07/07/2017  . IR RADIOLOGIST EVAL & MGMT  09/07/2017  . IR  RADIOLOGIST EVAL & MGMT  12/21/2017  . IR RADIOLOGIST EVAL & MGMT  03/22/2018  . IR RADIOLOGIST EVAL & MGMT  06/08/2018  . IR RADIOLOGIST EVAL & MGMT  11/01/2018  . IR RADIOLOGIST EVAL & MGMT  03/21/2019  . IR RADIOLOGIST EVAL & MGMT  11/08/2019  . IR RADIOLOGIST EVAL & MGMT  07/01/2020  . IR US GUIDE VASC ACCESS RIGHT  07/26/2017  . IR US GUIDE VASC ACCESS RIGHT  08/11/2017  . IR US GUIDE VASC ACCESS RIGHT  04/28/2018  . LUNG LOBECTOMY  06/10/13   upper left lung  . MULTIPLE TOOTH EXTRACTIONS    . REMOVAL RETAINED LENS Right 11/17/2015   Procedure: REMOVAL RETAINED LENS FROAGMENTS RIGHT EYE;  Surgeon: [name], MD;  Location: [place];  Service: Ophthalmology;  Laterality: Right;  . TONSILLECTOMY    . UPPER GASTROINTESTINAL ENDOSCOPY  11-03-15   Dr [name]  . VASECTOMY    . VIDEO ASSISTED THORACOSCOPY (VATS)/WEDGE RESECTION Left 06/04/2013   Procedure: VIDEO ASSISTED THORACOSCOPY (VATS)/WEDGE RESECTION;  Surgeon: [name], MD;  Location: [place];  Service: Thoracic;  Laterality: Left;  [name] VIDEO BRONCHOSCOPY N/A 06/04/2013   Procedure: VIDEO BRONCHOSCOPY;  Surgeon: [name], MD;  Location: [place];  Service: Thoracic;  Laterality: N/A;    FAMILY HISTORY: Family  History  Problem Relation Age of Onset  . Stroke Mother   . Alzheimer's disease Mother   . Hypertension Sister   . Hyperlipidemia Sister   . Hypertension Brother   . Hyperlipidemia Brother   . Diabetes Brother     ADVANCED DIRECTIVES (Y/N):  N  HEALTH MAINTENANCE: Social History   Tobacco Use  . Smoking status: Former Smoker    Packs/day: 1.00    Years: 40.00    Pack years: 40.00    Types: Cigarettes    Quit date: 12/09/1998    Years since quitting: 21.7  . Smokeless tobacco: Never Used  Vaping Use  . Vaping Use: Never used  Substance Use Topics  . Alcohol use: No  . Drug use: No     Colonoscopy:  PAP:  Bone density:  Lipid panel:  Allergies  Allergen Reactions  . Tape Itching     Surgical tapes     Current Outpatient Medications  Medication Sig Dispense Refill  . aspirin EC 81 MG tablet Take 81 mg by mouth daily.     [name] atorvastatin (LIPITOR) 40 MG tablet TAKE 1 TABLET BY MOUTH ONCE DAILY FOR CHOLESTEROL 90 tablet 1  . b complex vitamins capsule Take 1 capsule by mouth daily.    . cholecalciferol (VITAMIN D3) 25 MCG (1000 UNIT) tablet Take 1,000 Units by mouth daily.    . Cinnamon 500 MG capsule Take 500 mg by mouth at bedtime.     . diphenhydrAMINE (BENADRYL) 25 mg capsule Take 25 mg by mouth every morning.     [name] DM-APAP-CPM (CORICIDIN HBP PO) Take 2 tablets by mouth daily as needed (allergies/cold symptoms).    . fluticasone furoate-vilanterol (BREO ELLIPTA) 100-25 MCG/INH AEPB Inhale 1 puff into the lungs daily. 28 each 11  . glucose blood (ONETOUCH VERIO) test strip USE 1 STRIP TO CHECK GLUCOSE ONCE DAILY 100 each 3  . losartan (COZAAR) 100 MG tablet Take 1 tablet (100 mg total) by mouth daily. 90 tablet 1  . Multiple Vitamins-Minerals (MULTIVITAMIN WITH MINERALS) tablet Take 1 tablet by mouth daily.     . polycarbophil (FIBERCON) 625 MG tablet Take 625 mg by mouth 2 (two) times daily.     . vitamin C (ASCORBIC ACID) 500 MG tablet Take 500 mg by mouth daily.     [name] zinc sulfate 220 (50 Zn) MG capsule Take 1 capsule (220 mg total) by mouth daily. 30 capsule 0   No current facility-administered medications for this visit.   Facility-Administered Medications Ordered in Other Visits  Medication Dose Route Frequency Provider Last Rate Last Admin  . lanreotide acetate (SOMATULINE DEPOT) injection 120 mg  120 mg Subcutaneous Once Lloyd Huger, MD        OBJECTIVE: Vitals:   09/09/20 1030  BP: (!) 161/81  Pulse: (!) 57  Resp: 17  Temp: (!) 96.2 F (35.7 C)  SpO2: 98%     Body mass index is 26.25 kg/m.    ECOG FS:0 - Asymptomatic  General: Well-developed, well-nourished, no acute distress. Eyes: Pink conjunctiva, anicteric sclera. HEENT:  Normocephalic, moist mucous membranes. Lungs: No audible wheezing or coughing. Heart: Regular rate and rhythm. Abdomen: Soft, nontender, no obvious distention. Musculoskeletal: No edema, cyanosis, or clubbing. Neuro: Alert, answering all questions appropriately. Cranial nerves grossly intact. Skin: No rashes or petechiae noted. Psych: Normal affect.   LAB RESULTS:  Lab Results  Component Value Date   NA 138 09/09/2020   K 4.0 09/09/2020   CL 103  09/09/2020   CO2 24 09/09/2020   GLUCOSE 115 (H) 09/09/2020   BUN 16 09/09/2020   CREATININE 0.80 09/09/2020   CALCIUM 9.4 09/09/2020   PROT 7.0 09/09/2020   ALBUMIN 4.0 09/09/2020   AST 27 09/09/2020   ALT 23 09/09/2020   ALKPHOS 132 (H) 09/09/2020   BILITOT 0.8 09/09/2020   GFRNONAA >60 09/09/2020   GFRAA >60 01/29/2020    Lab Results  Component Value Date   WBC 10.0 09/09/2020   NEUTROABS 7.1 09/09/2020   HGB 15.5 09/09/2020   HCT 46.8 09/09/2020   MCV 90.7 09/09/2020   PLT 172 09/09/2020     STUDIES: No results found.  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  PLAN:    1. 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 Patient underwent left upper lobe lobectomy in [place] on June 04, 2013. Final pathology results revealed a well differentiated neuroendocrine tumor with positive bronchial margins. Patient did not receive adjuvant XRT or chemotherapy at that time.  His most recent Y 90 ablation occurred on April 28, 2018.  Patient's chromogranin a levels are elevated, but relatively stable ranging from 213-261. Although more recently this is trended up to 527.2.  Unclear the clinical significance. CT scan results from June 12, 2020 reviewed independently with no evidence of recurrent or progressive disease.  Proceed with 120 mg subcutaneous lanreotide today.  Return to clinic  in 4 and 8 weeks for laboratory work and treatment only.  Patient will then return to clinic in 12 weeks for further evaluation, repeat imaging, and continuation of treatment.  Continue imaging every 6 months. 2.  Left mainstem endobronchial mass: Resolved.  Consistent with neuroendocrine tumor.  Patient underwent his third and final bronchoscopy with therapeutic cryoablation ablation on November 26, 2019.  Follow-up with pulmonology as indicated.   3.  Covid: Resolved.  4.  Urinary retention: Resolved. Foley has been removed.     Patient expressed understanding and was in agreement with this plan. He also understands that He can call clinic at any time with any questions, concerns, or complaints.   Cancer Staging Neuroendocrine carcinoma of lung [facility]) Staging form: Lung, AJCC 8th Edition - Clinical stage from 12/28/2016: Stage IVA (cT2a, cN0, cM1b) - Signed by Lloyd Huger, MD on 12/28/2016 Sites of metastasis: Liver   Lloyd Huger, MD   09/11/2020 6:27 AM

## 2020-09-09 ENCOUNTER — Ambulatory Visit: Payer: Medicare Other | Admitting: Oncology

## 2020-09-09 ENCOUNTER — Other Ambulatory Visit: Payer: Self-pay

## 2020-09-09 ENCOUNTER — Ambulatory Visit: Payer: Medicare Other

## 2020-09-09 ENCOUNTER — Inpatient Hospital Stay: Payer: Medicare Other

## 2020-09-09 ENCOUNTER — Inpatient Hospital Stay: Payer: Medicare Other | Attending: Oncology | Admitting: Oncology

## 2020-09-09 ENCOUNTER — Encounter: Payer: Self-pay | Admitting: Oncology

## 2020-09-09 ENCOUNTER — Other Ambulatory Visit: Payer: Medicare Other

## 2020-09-09 VITALS — BP 161/81 | HR 57 | Temp 96.2°F | Resp 17 | Ht 64.0 in | Wt 152.9 lb

## 2020-09-09 DIAGNOSIS — C7A8 Other malignant neuroendocrine tumors: Secondary | ICD-10-CM

## 2020-09-09 DIAGNOSIS — Z79899 Other long term (current) drug therapy: Secondary | ICD-10-CM | POA: Insufficient documentation

## 2020-09-09 DIAGNOSIS — C7B02 Secondary carcinoid tumors of liver: Secondary | ICD-10-CM | POA: Diagnosis not present

## 2020-09-09 DIAGNOSIS — C7A090 Malignant carcinoid tumor of the bronchus and lung: Secondary | ICD-10-CM | POA: Insufficient documentation

## 2020-09-09 LAB — CBC WITH DIFFERENTIAL/PLATELET
Abs Immature Granulocytes: 0.03 10*3/uL (ref 0.00–0.07)
Basophils Absolute: 0.1 10*3/uL (ref 0.0–0.1)
Basophils Relative: 1 %
Eosinophils Absolute: 0.1 10*3/uL (ref 0.0–0.5)
Eosinophils Relative: 1 %
HCT: 46.8 % (ref 39.0–52.0)
Hemoglobin: 15.5 g/dL (ref 13.0–17.0)
Immature Granulocytes: 0 %
Lymphocytes Relative: 10 %
Lymphs Abs: 1 10*3/uL (ref 0.7–4.0)
MCH: 30 pg (ref 26.0–34.0)
MCHC: 33.1 g/dL (ref 30.0–36.0)
MCV: 90.7 fL (ref 80.0–100.0)
Monocytes Absolute: 1.7 10*3/uL — ABNORMAL HIGH (ref 0.1–1.0)
Monocytes Relative: 17 %
Neutro Abs: 7.1 10*3/uL (ref 1.7–7.7)
Neutrophils Relative %: 71 %
Platelets: 172 10*3/uL (ref 150–400)
RBC: 5.16 MIL/uL (ref 4.22–5.81)
RDW: 14.2 % (ref 11.5–15.5)
WBC: 10 10*3/uL (ref 4.0–10.5)
nRBC: 0 % (ref 0.0–0.2)

## 2020-09-09 LAB — COMPREHENSIVE METABOLIC PANEL
ALT: 23 U/L (ref 0–44)
AST: 27 U/L (ref 15–41)
Albumin: 4 g/dL (ref 3.5–5.0)
Alkaline Phosphatase: 132 U/L — ABNORMAL HIGH (ref 38–126)
Anion gap: 11 (ref 5–15)
BUN: 16 mg/dL (ref 8–23)
CO2: 24 mmol/L (ref 22–32)
Calcium: 9.4 mg/dL (ref 8.9–10.3)
Chloride: 103 mmol/L (ref 98–111)
Creatinine, Ser: 0.8 mg/dL (ref 0.61–1.24)
GFR, Estimated: >60 mL/min (ref >60)
Glucose, Bld: 115 mg/dL — ABNORMAL HIGH (ref 70–99)
Potassium: 4 mmol/L (ref 3.5–5.1)
Sodium: 138 mmol/L (ref 135–145)
Total Bilirubin: 0.8 mg/dL (ref 0.3–1.2)
Total Protein: 7 g/dL (ref 6.5–8.1)

## 2020-09-09 MED ORDER — LANREOTIDE ACETATE 120 MG/0.5ML ~~LOC~~ SOLN: 120.0000 mg | Freq: Once | SUBCUTANEOUS | Status: DC

## 2020-09-09 MED ORDER — LANREOTIDE ACETATE 120 MG/0.5ML ~~LOC~~ SOLN
120.0000 mg | Freq: Once | SUBCUTANEOUS | Status: AC
Start: 2020-09-09 — End: 2020-09-09
  Administered 2020-09-09: 120 mg via SUBCUTANEOUS
  Filled 2020-09-09: qty 120

## 2020-09-11 LAB — CHROMOGRANIN A: Chromogranin A (ng/mL): 571 ng/mL — ABNORMAL HIGH (ref 0.0–101.8)

## 2020-10-07 ENCOUNTER — Inpatient Hospital Stay: Payer: Medicare Other

## 2020-10-07 ENCOUNTER — Other Ambulatory Visit: Payer: Self-pay

## 2020-10-07 DIAGNOSIS — C7A8 Other malignant neuroendocrine tumors: Secondary | ICD-10-CM

## 2020-10-07 DIAGNOSIS — C7A090 Malignant carcinoid tumor of the bronchus and lung: Secondary | ICD-10-CM | POA: Diagnosis not present

## 2020-10-07 LAB — CBC WITH DIFFERENTIAL/PLATELET
Abs Immature Granulocytes: 0.07 10*3/uL (ref 0.00–0.07)
Basophils Absolute: 0.1 10*3/uL (ref 0.0–0.1)
Basophils Relative: 1 %
Eosinophils Absolute: 0.2 10*3/uL (ref 0.0–0.5)
Eosinophils Relative: 1 %
HCT: 45.4 % (ref 39.0–52.0)
Hemoglobin: 15 g/dL (ref 13.0–17.0)
Immature Granulocytes: 1 %
Lymphocytes Relative: 8 %
Lymphs Abs: 1.2 10*3/uL (ref 0.7–4.0)
MCH: 30.2 pg (ref 26.0–34.0)
MCHC: 33 g/dL (ref 30.0–36.0)
MCV: 91.3 fL (ref 80.0–100.0)
Monocytes Absolute: 2.5 10*3/uL — ABNORMAL HIGH (ref 0.1–1.0)
Monocytes Relative: 17 %
Neutro Abs: 10.8 10*3/uL — ABNORMAL HIGH (ref 1.7–7.7)
Neutrophils Relative %: 72 %
Platelets: 164 10*3/uL (ref 150–400)
RBC: 4.97 MIL/uL (ref 4.22–5.81)
RDW: 14.6 % (ref 11.5–15.5)
WBC: 14.8 10*3/uL — ABNORMAL HIGH (ref 4.0–10.5)
nRBC: 0 % (ref 0.0–0.2)

## 2020-10-07 LAB — COMPREHENSIVE METABOLIC PANEL
ALT: 27 U/L (ref 0–44)
AST: 26 U/L (ref 15–41)
Albumin: 3.7 g/dL (ref 3.5–5.0)
Alkaline Phosphatase: 129 U/L — ABNORMAL HIGH (ref 38–126)
Anion gap: 10 (ref 5–15)
BUN: 15 mg/dL (ref 8–23)
CO2: 24 mmol/L (ref 22–32)
Calcium: 9.1 mg/dL (ref 8.9–10.3)
Chloride: 106 mmol/L (ref 98–111)
Creatinine, Ser: 0.88 mg/dL (ref 0.61–1.24)
GFR, Estimated: >60 mL/min (ref >60)
Glucose, Bld: 123 mg/dL — ABNORMAL HIGH (ref 70–99)
Potassium: 3.8 mmol/L (ref 3.5–5.1)
Sodium: 140 mmol/L (ref 135–145)
Total Bilirubin: 0.6 mg/dL (ref 0.3–1.2)
Total Protein: 6.7 g/dL (ref 6.5–8.1)

## 2020-10-07 MED ORDER — LANREOTIDE ACETATE 120 MG/0.5ML ~~LOC~~ SOLN
120.0000 mg | Freq: Once | SUBCUTANEOUS | Status: AC
  Administered 2020-10-07: 120 mg via SUBCUTANEOUS
  Filled 2020-10-07: qty 120

## 2020-10-09 LAB — CHROMOGRANIN A: Chromogranin A (ng/mL): 532.6 ng/mL — ABNORMAL HIGH (ref 0.0–101.8)

## 2020-11-04 ENCOUNTER — Inpatient Hospital Stay: Payer: Medicare Other

## 2020-11-04 ENCOUNTER — Inpatient Hospital Stay: Payer: Medicare Other | Attending: Oncology

## 2020-11-04 ENCOUNTER — Other Ambulatory Visit: Payer: Self-pay

## 2020-11-04 DIAGNOSIS — C7A090 Malignant carcinoid tumor of the bronchus and lung: Secondary | ICD-10-CM | POA: Diagnosis not present

## 2020-11-04 DIAGNOSIS — Z79899 Other long term (current) drug therapy: Secondary | ICD-10-CM | POA: Diagnosis not present

## 2020-11-04 DIAGNOSIS — C7A8 Other malignant neuroendocrine tumors: Secondary | ICD-10-CM

## 2020-11-04 LAB — COMPREHENSIVE METABOLIC PANEL
ALT: 21 U/L (ref 0–44)
AST: 25 U/L (ref 15–41)
Albumin: 3.9 g/dL (ref 3.5–5.0)
Alkaline Phosphatase: 120 U/L (ref 38–126)
Anion gap: 8 (ref 5–15)
BUN: 20 mg/dL (ref 8–23)
CO2: 25 mmol/L (ref 22–32)
Calcium: 9.4 mg/dL (ref 8.9–10.3)
Chloride: 103 mmol/L (ref 98–111)
Creatinine, Ser: 0.96 mg/dL (ref 0.61–1.24)
GFR, Estimated: >60 mL/min (ref >60)
Glucose, Bld: 123 mg/dL — ABNORMAL HIGH (ref 70–99)
Potassium: 4.4 mmol/L (ref 3.5–5.1)
Sodium: 136 mmol/L (ref 135–145)
Total Bilirubin: 0.8 mg/dL (ref 0.3–1.2)
Total Protein: 6.7 g/dL (ref 6.5–8.1)

## 2020-11-04 LAB — CBC WITH DIFFERENTIAL/PLATELET
Abs Immature Granulocytes: 0.05 10*3/uL (ref 0.00–0.07)
Basophils Absolute: 0.1 10*3/uL (ref 0.0–0.1)
Basophils Relative: 1 %
Eosinophils Absolute: 0.2 10*3/uL (ref 0.0–0.5)
Eosinophils Relative: 2 %
HCT: 46.2 % (ref 39.0–52.0)
Hemoglobin: 15.3 g/dL (ref 13.0–17.0)
Immature Granulocytes: 1 %
Lymphocytes Relative: 9 %
Lymphs Abs: 1 10*3/uL (ref 0.7–4.0)
MCH: 30.6 pg (ref 26.0–34.0)
MCHC: 33.1 g/dL (ref 30.0–36.0)
MCV: 92.4 fL (ref 80.0–100.0)
Monocytes Absolute: 1.6 10*3/uL — ABNORMAL HIGH (ref 0.1–1.0)
Monocytes Relative: 15 %
Neutro Abs: 7.6 10*3/uL (ref 1.7–7.7)
Neutrophils Relative %: 72 %
Platelets: 186 10*3/uL (ref 150–400)
RBC: 5 MIL/uL (ref 4.22–5.81)
RDW: 14.6 % (ref 11.5–15.5)
WBC: 10.5 10*3/uL (ref 4.0–10.5)
nRBC: 0 % (ref 0.0–0.2)

## 2020-11-04 MED ORDER — LANREOTIDE ACETATE 120 MG/0.5ML ~~LOC~~ SOLN
120.0000 mg | Freq: Once | SUBCUTANEOUS | Status: AC
  Administered 2020-11-04: 120 mg via SUBCUTANEOUS
  Filled 2020-11-04: qty 120

## 2020-11-05 LAB — CHROMOGRANIN A: Chromogranin A (ng/mL): 536.8 ng/mL — ABNORMAL HIGH (ref 0.0–101.8)

## 2020-11-06 ENCOUNTER — Telehealth: Payer: Self-pay

## 2020-11-06 NOTE — Telephone Encounter (Signed)
Spoke with the patient and let him know that he was due for his 5 year Colonoscopy follow up. He will think about where he would like to go and will give [place] a call back about this.

## 2020-11-14 ENCOUNTER — Other Ambulatory Visit: Payer: Self-pay | Admitting: Family Medicine

## 2020-11-14 DIAGNOSIS — E782 Mixed hyperlipidemia: Secondary | ICD-10-CM

## 2020-11-14 MED ORDER — ATORVASTATIN CALCIUM 40 MG PO TABS: 40.0000 mg | ORAL_TABLET | Freq: Every day | ORAL | 0 refills | Status: DC

## 2020-11-14 NOTE — Telephone Encounter (Signed)
Copied from [place] [phone]. Topic: Quick Communication - Rx Refill/Question >> Nov 14, 2020  1:38 PM Leward Quan A wrote: Medication: atorvastatin (LIPITOR) 40 MG tablet   Has the patient contacted their pharmacy? Yes.   (Agent: If no, request that the patient contact the pharmacy for the refill.) (Agent: If yes, when and what did the pharmacy advise?)  Preferred Pharmacy (with phone number or street name): [place], [place] - [place]  Phone:  [phone] Fax:  [phone]     Agent: Please be advised that RX refills may take up to 3 business days. We ask that you follow-up with your pharmacy.

## 2020-11-20 ENCOUNTER — Other Ambulatory Visit: Payer: Self-pay

## 2020-11-20 ENCOUNTER — Ambulatory Visit (INDEPENDENT_AMBULATORY_CARE_PROVIDER_SITE_OTHER): Payer: Medicare Other | Admitting: Pulmonary Disease

## 2020-11-20 ENCOUNTER — Other Ambulatory Visit: Payer: Self-pay | Admitting: Interventional Radiology

## 2020-11-20 ENCOUNTER — Encounter: Payer: Self-pay | Admitting: Pulmonary Disease

## 2020-11-20 VITALS — BP 132/80 | HR 58 | Temp 97.6°F | Ht 64.0 in | Wt 159.0 lb

## 2020-11-20 DIAGNOSIS — C7A8 Other malignant neuroendocrine tumors: Secondary | ICD-10-CM | POA: Diagnosis not present

## 2020-11-20 DIAGNOSIS — J449 Chronic obstructive pulmonary disease, unspecified: Secondary | ICD-10-CM | POA: Diagnosis not present

## 2020-11-20 DIAGNOSIS — Z8616 Personal history of COVID-19: Secondary | ICD-10-CM

## 2020-11-20 NOTE — Patient Instructions (Signed)
Use your Breo inhaler daily.  Make sure you rinse your mouth well after you use it.  This is to help control your cough.   Will be on the look out for the CT that he will have on 5 August.   We will we will see you in follow-up in 3 months time.  Call sooner should any new problems arise.

## 2020-11-20 NOTE — Progress Notes (Signed)
Subjective:    Patient ID: Jason Malone, male    DOB: 04/19/1941, 80 y.o.   MRN: [id number] Chief Complaint  Patient presents with   Follow-up    C/o non prod cough.    HPI [name] is 80 year old with stage IVa neuroendocrine tumor of the lung, metastatic to the liver, who presents for follow-up for the same as well as for COPD and cough.  He is being followed at the cancer center by Dr. Delight Hoh. The patient had undergone left upper lobectomy by Dr. Eleanora Neighbor in January 2015. This revealed a neuroendocrine tumor (carcinoid) which was 4.7 cm in diameter. There was endobronchial involvement at the time. The mass was in the hilum and initially it was thought that patient would need pneumonectomy. During surgery it was noted that the tumor encircled the pulmonary artery. The dissection was noted to be "tedious". The initial margins of the bronchial stump were positive for tumor. However, once this was recognized additional resection was done and margins were clear on the final pathology. After resection the patient was presented at the multidisciplinary thoracic oncology conference and no further treatment other than observation was recommended. He was classified as a stage Ib. Patient was noted to have a recurrence of neuroendocrine disease on December 20, 2016 when liver involvement was noted. He was started on lanreotide every 4 weeks.  In June 10, 2019 he contracted COVID-19.  He was admitted to the [facility].  He developed acute hypoxic respiratory failure due to COVID-19.  A CT scan performed on 11 June 2019 showed typical parenchymal involvement of COVID-19 on the right lung.  On the left lung he had a lobulated mass in the left mainstem bronchus.  This appeared to be arising from the suture line from the prior resection.  The patient underwent bronchoscopy on 3 separate occasions on 19 Sep 2019, 10 October 2019 and finally on 26 November 2019.  On these occasions he underwent spray  cryoablation of the left mainstem bronchus mass.  The mass was debulked, it was consistent with neuroendocrine carcinoma (carcinoid).  After his initial debulking procedure the patient's respiratory status improved dramatically to the point that he was able to be weaned off of oxygen altogether.  We continue to monitor this area as he is at high risk for recurrence.  He remains on lanreotide for management of his neuroendocrine tumor.  He presents today for follow-up mostly on issues with dry cough.  He has been prescribed Breo which helps him with reactive airways disease and cough.  For some reason he is not using this daily but rather 3 times a week.  He does note that on the days he does not use it his cough is worse.  He has not had any fevers, chills or sweats.  No sputum production.  No hemoptysis.  He has not had any chest pain, paroxysmal nocturnal dyspnea, orthopnea, lower extremity edema or calf pain.  He is to have a CT scan of the chest abdomen and pelvis on 12 December 2020 we will follow-up on that CT at that time.  The CT scan performed in February 2022 was reviewed distally and showed only residual irregularity at the area of the treated bilobar lesion in the left mainstem bronchus.  He does not endorse any other complaints or issues today.  Overall he feels well and looks well.  Review of Systems A 10 point review of systems was performed and it is as noted above otherwise  negative.  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   Respiratory failure ([place]) 06/11/2019   Elevated LFTs 06/11/2019   Pneumonia due to COVID-19 virus 06/10/2019   Contracture of palmar fascia 10/11/2018   Microalbuminuria due to type 2 diabetes mellitus ([place]) 04/12/2018   Type 2 diabetes mellitus ([place]) 04/10/2018   Osteopenia determined by x-ray 01/07/2017   Neuroendocrine carcinoma of lung ([place]) 12/28/2016   Low back pain 12/17/2016   Liver mass, left lobe 08/28/2016   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 08/28/2016   History of lung cancer 06/29/2016   Aortic atherosclerosis ([place]) 04/06/2016   Coronary artery disease involving native heart without angina pectoris 04/06/2016   History of colonic polyps 10/28/2015   Medication monitoring encounter 08/15/2015   Paroxysmal atrial flutter (HCC)    Elevated serum alkaline phosphatase level 03/05/2015   Allergic rhinitis    BPH without urinary obstruction    Hyperlipidemia 02/13/2014   Orthostasis 07/18/2013   Sinus bradycardia 07/18/2013   S/P lobectomy of lung 06/04/2013   Atrial flutter ([place]) 04/09/2013   Smoking history 04/09/2013   Essential hypertension 04/09/2013   Rectal bleeding 12/15/2012   Social History   Tobacco Use   Smoking status: Former    Packs/day: 1.00    Years: 40.00    Pack years: 40.00    Types: Cigarettes    Quit date: 12/09/1998    Years since quitting: 21.9   Smokeless tobacco: Never  Substance Use Topics   Alcohol use: No   Allergies  Allergen Reactions   Tape Itching    Surgical tapes    Current Meds  Medication Sig   aspirin EC 81 MG tablet Take 81 mg by mouth daily.    atorvastatin (LIPITOR) 40 MG tablet Take 1 tablet (40 mg total) by mouth daily. for cholesterol.   b complex vitamins capsule Take 1 capsule by mouth daily.   cholecalciferol (VITAMIN D3) 25 MCG (1000 UNIT) tablet Take 1,000 Units by mouth daily.   Cinnamon 500 MG capsule Take 500 mg by mouth at bedtime.     diphenhydrAMINE (BENADRYL) 25 mg capsule Take 25 mg by mouth every morning.    DM-APAP-CPM (CORICIDIN HBP PO) Take 2 tablets by mouth daily as needed (allergies/cold symptoms).   fluticasone furoate-vilanterol (BREO ELLIPTA) 100-25 MCG/INH AEPB Inhale 1 puff into the lungs daily.   glucose blood (ONETOUCH VERIO) test strip USE 1 STRIP TO CHECK GLUCOSE ONCE DAILY   Multiple Vitamins-Minerals (MULTIVITAMIN WITH MINERALS) tablet Take 1 tablet by mouth daily.    polycarbophil (FIBERCON) 625 MG tablet Take 625 mg by mouth 2 (two) times daily.    vitamin C (ASCORBIC ACID) 500 MG tablet Take 500 mg by mouth daily.    zinc sulfate 220 (50 Zn) MG capsule Take 1 capsule (220 mg total) by mouth daily.   Immunization History  Administered Date(s) Administered   Fluad Quad(high Dose 65+) 01/24/2019, 01/23/2020   Influenza, High Dose Seasonal PF 02/12/2016, 02/14/2017, 01/17/2018   Influenza,inj,Quad PF,6+ Mos 02/14/2015  PFIZER(Purple Top)SARS-COV-2 Vaccination 05/24/2019, 10/02/2019, 04/14/2020   Pneumococcal Conjugate-13 02/01/2014   Pneumococcal Polysaccharide-23 05/10/2008       Objective:   Physical Exam BP 132/80 (BP Location: Left Arm, Cuff Size: Normal)   Pulse (!) 58   Temp 97.6 F (36.4 C) (Temporal)   Ht 5\' 4"  (1.626 m)   Wt 159 lb (72.1 kg)   SpO2 99%   BMI 27.29 kg/m   GENERAL: Awake and alert, well developed, well nourished gentleman, in no respiratory distress, fully ambulatory, no conversational dyspnea. HEAD: Normocephalic, atraumatic. EYES: Pupils equal, round, reactive to light.  No scleral icterus. MOUTH: Nose/mouth/throat not examined due to masking requirements for COVID 19. NECK: Supple. No thyromegaly. No nodules. No JVD.  Trachea is midline PULMONARY:  Symmetrical air entry, somewhat distant sounds, no wheezes or rhonchi noted. CARDIOVASCULAR: S1 and S2.    Regular rate and rhythm.  No rubs murmurs or gallops appreciated. GASTROINTESTINAL: Benign. MUSCULOSKELETAL:  No joint deformity, no clubbing, no edema. NEUROLOGIC: No focal deficit, no gait disturbance, speech is fluent. SKIN: Intact,warm,dry. No rashes noted on limited exam. PSYCH: Mood and behavior normal.     Assessment & Plan:     ICD-10-CM   1. COPD mixed type ([place])  J44.9    He has been reminded to use his Breo daily     2. Neuroendocrine carcinoma of lung ([place])  C7A.8    We will review CT scan of the chest ordered for 5 August Will determine if he needs rebronchoscopy    3. Personal history of COVID-19  Z86.16    Mild postinflammatory fibrosis on the right lung No other sequela     Patient appears to be doing well with the exception of persistent cough.  This is better with Breo.  Inexplicably he has not been using the [place] daily.  We recommended daily use.  He is to have a repeat CT chest on 5 August.  I will check on that CT when available to determine whether he may need rebronchoscopy.  As of his last CT he did not have any evidence of significant recurrence with the exception of minimal residual wall irregularity in the vicinity of the prior bilobar lesion of the previously treated area in the left mainstem bronchus.  He follows up closely with IR with regards to prior Y 90 embolization of his liver metastatic disease.  Last embolization was in 2019.  We will see the patient in follow-up in 3 months time unless review of the CT that he is to have on 5 August dictates a sooner appointment.  He is also to contact us prior to that time should any new difficulties arise.  Renold Don, MD [facility]   *This note was dictated using voice recognition software/Dragon.  Despite best efforts to proofread, errors can occur which can change the meaning.  Any change was purely unintentional.

## 2020-11-21 ENCOUNTER — Encounter: Payer: Self-pay | Admitting: Pulmonary Disease

## 2020-12-01 ENCOUNTER — Ambulatory Visit: Payer: Medicare Other | Admitting: Family Medicine

## 2020-12-03 ENCOUNTER — Other Ambulatory Visit: Payer: Medicare Other

## 2020-12-03 ENCOUNTER — Ambulatory Visit: Payer: Medicare Other | Admitting: Family Medicine

## 2020-12-03 ENCOUNTER — Ambulatory Visit: Payer: Medicare Other

## 2020-12-03 ENCOUNTER — Ambulatory Visit: Payer: Medicare Other | Admitting: Oncology

## 2020-12-10 IMAGING — CT CT CHEST W/ CM
2 of 5 series · 11 of 36 positions shown, 13 images · IV contrast (omnipaque)
Comparison: Multiple exams, including 08/15/2019 07/03/2019.

CLINICAL DATA: Restaging of stage [place]k neuroendocrine tumor of the
lung metastatic to the liver. Prior left upper lobectomy May 25, 2013. Most recent [AGE] ablation April 28, 2018. Left mainstem
bronchus endobronchial mass consistent with neuroendocrine tumor,
interval ablation therapies. Lanreotide therapy.

EXAM:
CT CHEST, ABDOMEN, AND PELVIS WITH CONTRAST
TECHNIQUE: Multidetector CT imaging of the chest, abdomen and pelvis was
performed following the standard protocol during bolus
administration of intravenous contrast.
CONTRAST:  100mL OMNIPAQUE IOHEXOL 300 MG/ML  SOLN

[Series 2: axials cap 5.00 · axial · 0.81mm/px · z∈[-1457,-927]mm · 8 of 134 slices shown, 10 images]
[im 14/134  mediastinal]
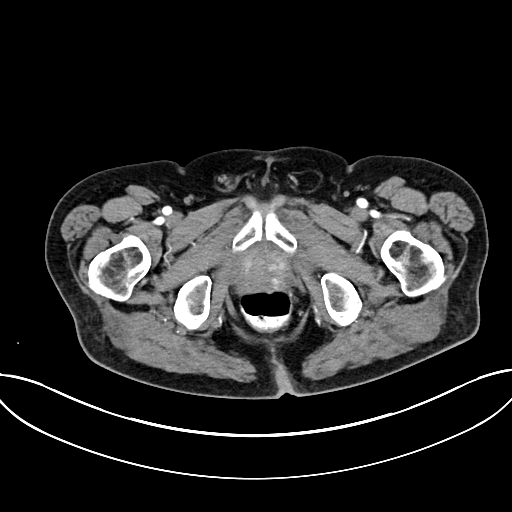
[im 14/134  lung]
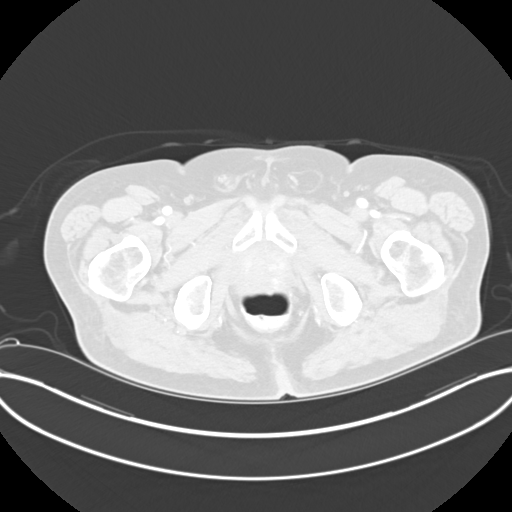
[im 27/134  lung]
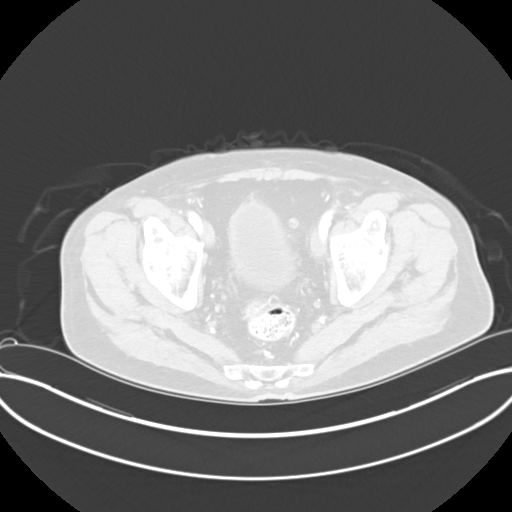
[im 40/134  lung]
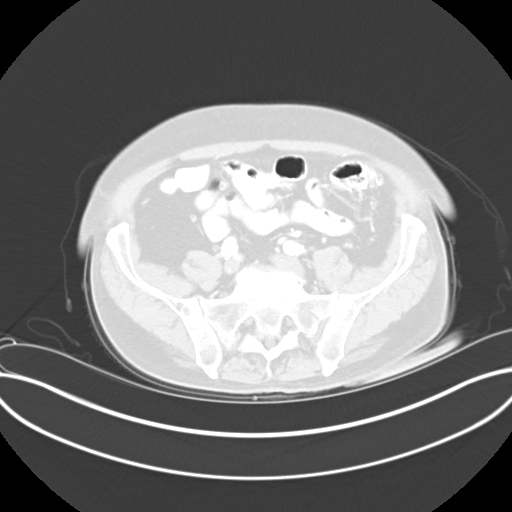
[im 54/134  lung]
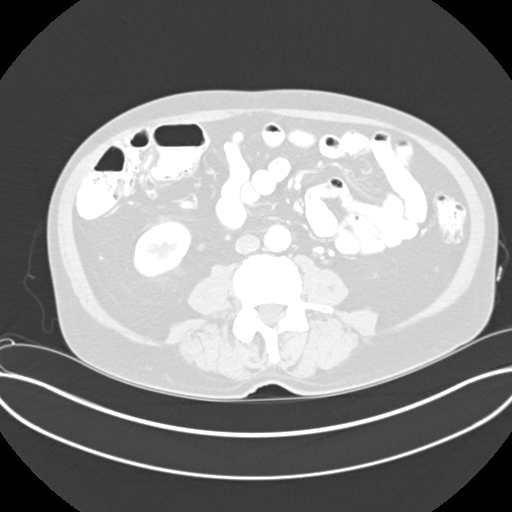
[im 80/134  mediastinal]
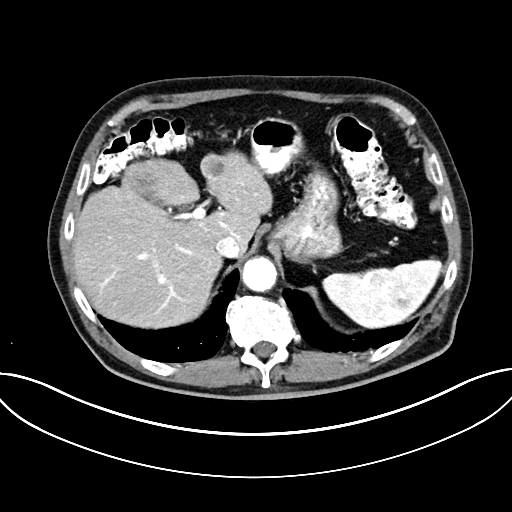
[im 80/134  lung]
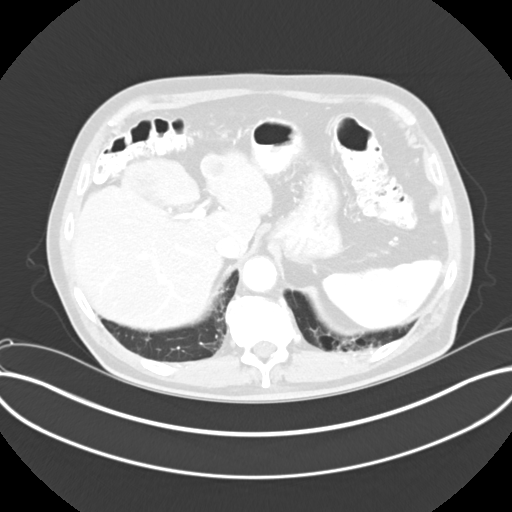
[im 94/134  lung]
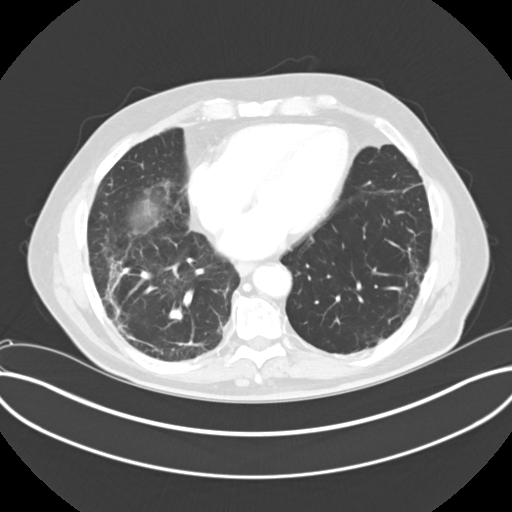
[im 107/134  lung]
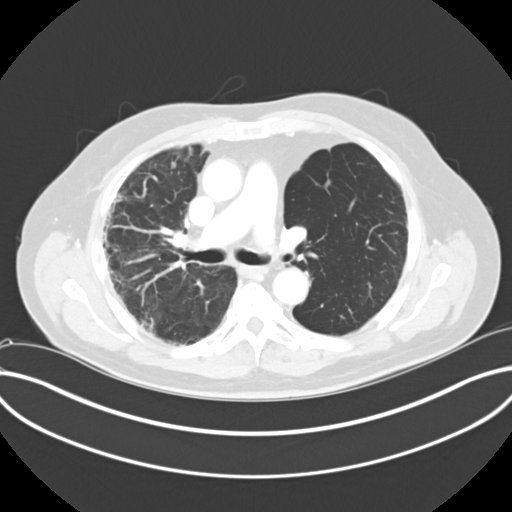
[im 120/134  lung]
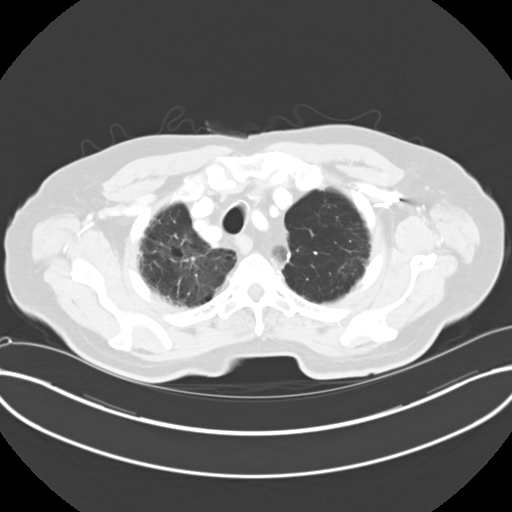

[Series 4: coronals cap 2.00 cor · coronal · 0.80mm/px · 3 of 136 slices shown]
[im 28/136  lung]
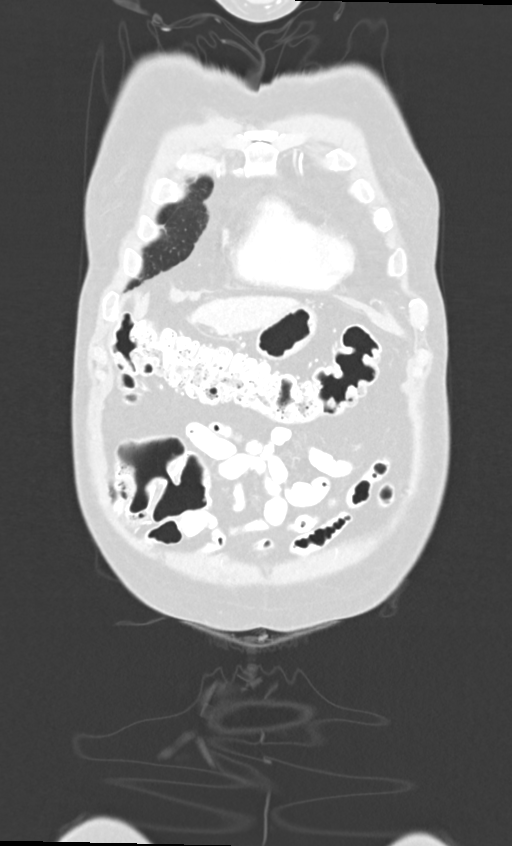
[im 55/136  lung]
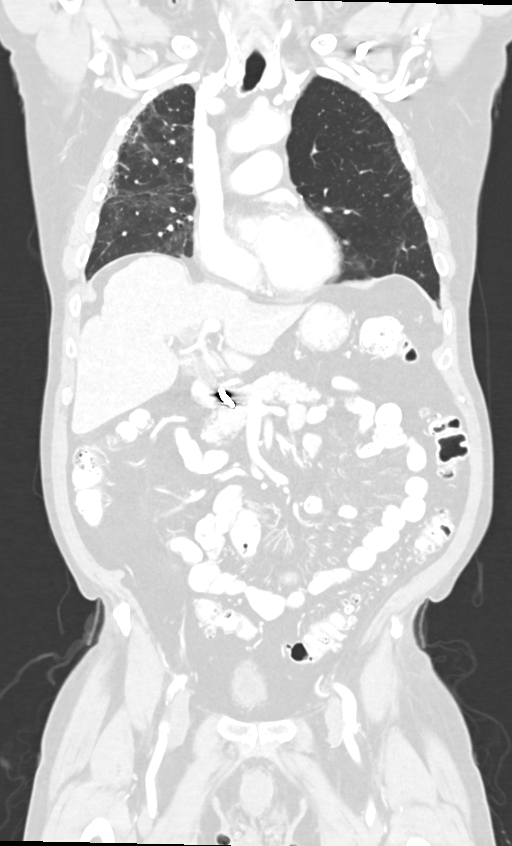
[im 82/136  lung]
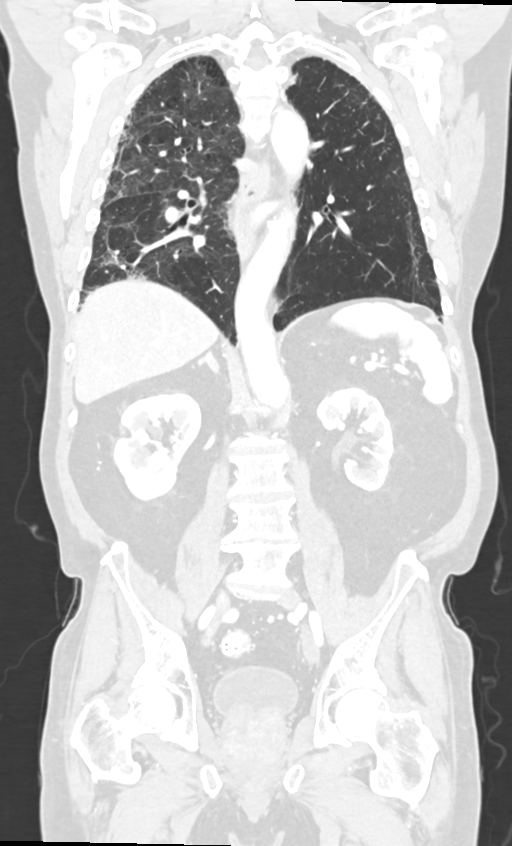

[11 of 36 positions shown; findings below may reference images not displayed]

FINDINGS: CT CHEST FINDINGS

Cardiovascular: Coronary, aortic arch, and branch vessel
atherosclerotic vascular disease. No pathologic adenopathy.

Mediastinum/Nodes: Unremarkable

Lungs/Pleura: Emphysema and scattered scarring with subpleural
reticulation especially in the right lung which is moderately
improved from prior. Scattered subpleural spurring particularly in
the right lung. Some of the previous ground-glass opacities in the
right lung have resolved.

Calcified right upper lobe pulmonary nodule compatible with
granuloma, stable. Left upper lobectomy. The previous bilobed
lesions in the left mainstem bronchus appear resolved or at least
markedly reduced, with only minimal residual wall irregularity in
this vicinity on images 70 through 73 of series 3, indicating
interval highly effective therapy. No new nodule identified.

Musculoskeletal: Healing fracture of the left anterior eleventh rib
on image 163/3. This is just proximal to the costochondral junction.
Mild sclerosis anteriorly in the right sixth rib, not changed from
prior exams.

Thoracic spondylosis. 1.0 cm rim sclerotic lesion in the T11
vertebral body eccentric to the left, image 109/204, nonspecific,
not changed from 08/15/2019. Multiple Schmorl's nodes in the
thoracic spine.

CT ABDOMEN PELVIS FINDINGS

Hepatobiliary: Partially exophytic 2.0 cm lesion with some central
necrosis anteriorly segment 3 of the liver on image 55/2, no change
from 07/03/2019, consistent with known metastatic disease. 3 mm
hypodense lesion in the right hepatic lobe on image 62/2, stable.

Calcified gallstones in the gallbladder neck.

Subtle transient hepatic attenuation difference posteriorly along
the dome of the right hepatic lobe on image 48/4 without a
well-defined underlying lesion observed.

Pancreas: Unremarkable

Spleen: 1.2 cm hypodensity in the spleen on image 55/2 is
retrospectively stable across prior exams including 03/15/2019,
probably benign but attention on follow up is suggested.

Adrenals/Urinary Tract: Right mid kidney scarring laterally. Left
renal atrophy. 5 mm hypodense lesion left kidney upper pole is
technically too small to characterize although statistically likely
to be a cyst.

Mildly irregular 1.9 by 0.6 by 0.2 cm hyperdensity dependently in
the urinary bladder. This has a very flat appearance and is not
highly characteristic of bladder calculus, but is new compared to
08/15/2019, query retained Foley catheter balloon fragment versus
interval urologic procedure, correlate with patient history.

Stomach/Bowel: Sigmoid colon diverticulosis. No active
diverticulitis. No dilated bowel.

Vascular/Lymphatic: Aortoiliac atherosclerotic vascular disease. No
pathologic adenopathy.

Reproductive: Stable prostatomegaly.

Other: No supplemental non-categorized findings.

Musculoskeletal: Stable small sclerotic lesions in the bony pelvis
and lumbar spine. Some of these resemble bone islands. A more rim
sclerotic 0.9 cm lesion anteriorly in the L2 vertebral body is
stable. Small lesion along the inferior endplate of L3 is chronic
and shown on prior MRI examinations.
IMPRESSION: 1. Resolution of the previous bilobed lesions in the left mainstem
bronchus, with only minimal residual wall irregularity in this
vicinity currently.
2. Stable appearance of the known metastatic disease to the liver.
3. Healing fracture of the left anterior eleventh rib.
4. Other imaging findings of potential clinical significance:
Coronary, aortic arch, and branch vessel atherosclerotic vascular
disease. Emphysema and scattered scarring with subpleural
reticulation especially in the right lung which is moderately
improved from prior. Cholelithiasis. Left renal atrophy. Sigmoid
colon diverticulosis. Stable prostatomegaly. Several chronic but
nonspecific bony lesions which may well be degenerative/benign but
which merit surveillance.
5. Emphysema and aortic atherosclerosis.

Aortic Atherosclerosis (CV37L-1C9.9) and Emphysema (CV37L-0CY.Y).

## 2020-12-12 ENCOUNTER — Other Ambulatory Visit: Payer: Self-pay

## 2020-12-12 ENCOUNTER — Ambulatory Visit
Admission: RE | Admit: 2020-12-12 | Discharge: 2020-12-12 | Disposition: A | Discharge status: Home / Self Care | Payer: Medicare Other | Source: Ambulatory Visit | Attending: Oncology | Admitting: Oncology

## 2020-12-12 DIAGNOSIS — C7A8 Other malignant neuroendocrine tumors: Secondary | ICD-10-CM | POA: Diagnosis not present

## 2020-12-12 LAB — POCT I-STAT CREATININE: Creatinine, Ser: 1 mg/dL (ref 0.61–1.24)

## 2020-12-12 MED ORDER — IOHEXOL 300 MG/ML  SOLN
100.0000 mL | Freq: Once | INTRAMUSCULAR | Status: AC | PRN
  Administered 2020-12-12: 100 mL via INTRAVENOUS

## 2020-12-12 NOTE — Progress Notes (Signed)
[place]  [phone] [phone]  ID: Jason Malone OB: Jul 04, 1940  MR#: [id number]  QZR#:[id number]  Patient Care Team: Towanda Malkin, MD as PCP - General (Internal Medicine) Minna Merritts, MD as PCP - Cardiology (Cardiology) [name], Forest Gleason, MD (General Surgery) Nestor Lewandowsky, MD (Inactive) as Referring Physician (Cardiothoracic Surgery) Minna Merritts, MD as Consulting Physician (Cardiology) [name], MD (Inactive) as Consulting Physician (Cardiothoracic Surgery) Lloyd Huger, MD as Consulting Physician (Oncology) Sanda Klein Satira Anis, MD as Attending Physician (Family Medicine)   CHIEF COMPLAINT: 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  INTERVAL HISTORY: Patient returns to clinic today for further evaluation, discussion of his imaging results, and continuation of lanreotide.  He has occasional weakness and fatigue, but otherwise feels well.  He denies any pain. He has no neurologic complaints.  He denies any recent fevers or illnesses.  He has a good appetite and denies weight loss.  He denies any chest pain, shortness of breath, cough, or hemoptysis.  He denies any abdominal pain.  He has no nausea, vomiting, constipation, or diarrhea.  He has no urinary complaints.  Patient feels at his baseline offers no further specific complaints today.  REVIEW OF SYSTEMS:   Review of Systems  Constitutional: Negative.  Negative for fever, malaise/fatigue and weight loss.  Respiratory: Negative.  Negative for cough, hemoptysis and shortness of breath.   Cardiovascular: Negative.  Negative for chest pain and leg swelling.  Gastrointestinal: Negative.  Negative for abdominal pain, diarrhea, nausea and vomiting.  Genitourinary: Negative.  Negative for dysuria.  Musculoskeletal: Negative.  Negative for back pain.  Skin: Negative.  Negative for rash.  Neurological: Negative.  Negative for dizziness,  sensory change, focal weakness and weakness.  Psychiatric/Behavioral: Negative.  The patient is not nervous/anxious.    As per HPI. Otherwise, a complete review of systems is negative.  PAST MEDICAL HISTORY: Past Medical History:  Diagnosis Date   Allergic rhinitis    Benign prostatic hypertrophy with lower urinary tract symptoms (LUTS)    Chest pain    a. 05/2013 MV: EF 70%, no ischemia.   Diverticulitis    DIVERTICULOSIS   Enlarged prostate    Essential hypertension    CONTROLLED ON MEDS   Full dentures    upper and lower   H/O hypokalemia    Hx of atrial fibrillation without current medication    CARDIOLOGIST-DR GOLLAN/ [facility]   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 08/28/2016   Hyperlipemia    Hypomagnesemia    IFG (impaired fasting glucose)    Liver cancer (HCC)    Liver cancer ([place]) 12/08/2016   Liver mass, left lobe 08/28/2016   Probably hemangioma; will get MRI of liver, refer to GI   Lung cancer ([place])    a. carcinoid, left lung, Stage 1b (T2a, N0, cM0);  b. 05/2013 s/p VATS & LULobectomy.   Monocytosis    Osteopenia    Paroxysmal atrial flutter (HCC)    a. 03/2013->no recurrence;  b. CHA2DS2VASc = 2-->not currently on anticoagulation;  c. 05/2012 Echo: EF 55-60%, normal RV.   Wears glasses     PAST SURGICAL HISTORY: Past Surgical History:  Procedure Laterality Date   BRONCHOSCOPY     04/06/2013   CARDIOVASCULAR STRESS TEST  05/2013   a. No evidence of ischemia or infarct, EF 70%, no WMAs   CATARACT EXTRACTION W/PHACO Left 07/14/2015   Procedure: CATARACT EXTRACTION PHACO AND INTRAOCULAR  LENS PLACEMENT (IOC);  Surgeon: [name], MD;  Location: [place];  Service: Ophthalmology;  Laterality: Left;   CATARACT EXTRACTION W/PHACO Right 10/01/2015   Procedure: CATARACT EXTRACTION PHACO AND INTRAOCULAR LENS PLACEMENT (IOC);  Surgeon: [name], MD;  Location: [place];  Service: Ophthalmology;  Laterality: Right;    COLONOSCOPY W/ POLYPECTOMY     bleed after-had to go to surgery to stop bleeding via colonoscopy   COLONOSCOPY WITH PROPOFOL N/A 11/19/2015   Procedure: COLONOSCOPY WITH PROPOFOL;  Surgeon: [name], MD;  Location: [facility] ENDOSCOPY;  Service: Endoscopy;  Laterality: N/A;   DUPUYTREN CONTRACTURE RELEASE  12/15/2011   Procedure: DUPUYTREN CONTRACTURE RELEASE;  Surgeon: [name], MD;  Location: [place];  Service: Orthopedics;  Laterality: Left;  Fasciotomy left ring finger dupuytrens   ELECTROMAGNETIC NAVIGATION BROCHOSCOPY Left 09/19/2019   Procedure: ELECTROMAGNETIC NAVIGATION BRONCHOSCOPY;  Surgeon: Tyler Pita, MD;  Location: [facility];  Service: Cardiopulmonary;  Laterality: Left;   FASCIECTOMY Right 11/07/2018   Procedure: SEGMENTAL FASCIECTOMY RIGHT RING FINGER;  Surgeon: Daryll Brod, MD;  Location: [place];  Service: Orthopedics;  Laterality: Right;  AXILLARY BLOCK   FLEXIBLE BRONCHOSCOPY Left 11/26/2019   Procedure: FLEXIBLE BRONCHOSCOPY WITH CRYO THERAPY;  Surgeon: Tyler Pita, MD;  Location: [facility];  Service: Cardiopulmonary;  Laterality: Left;   IR ANGIOGRAM SELECTIVE EACH ADDITIONAL VESSEL  07/26/2017   IR ANGIOGRAM SELECTIVE EACH ADDITIONAL VESSEL  07/26/2017   IR ANGIOGRAM SELECTIVE EACH ADDITIONAL VESSEL  07/26/2017   IR ANGIOGRAM SELECTIVE EACH ADDITIONAL VESSEL  07/26/2017   IR ANGIOGRAM SELECTIVE EACH ADDITIONAL VESSEL  07/26/2017   IR ANGIOGRAM SELECTIVE EACH ADDITIONAL VESSEL  08/11/2017   IR ANGIOGRAM SELECTIVE EACH ADDITIONAL VESSEL  04/28/2018   IR ANGIOGRAM VISCERAL SELECTIVE  07/26/2017   IR ANGIOGRAM VISCERAL SELECTIVE  08/11/2017   IR ANGIOGRAM VISCERAL SELECTIVE  04/28/2018   IR EMBO ARTERIAL NOT HEMORR HEMANG INC GUIDE ROADMAPPING  07/26/2017   IR EMBO TUMOR ORGAN ISCHEMIA INFARCT INC GUIDE ROADMAPPING  08/11/2017   IR EMBO TUMOR ORGAN ISCHEMIA INFARCT INC GUIDE ROADMAPPING  04/28/2018   IR RADIOLOGIST EVAL & MGMT   07/07/2017   IR RADIOLOGIST EVAL & MGMT  09/07/2017   IR RADIOLOGIST EVAL & MGMT  12/21/2017   IR RADIOLOGIST EVAL & MGMT  03/22/2018   IR RADIOLOGIST EVAL & MGMT  06/08/2018   IR RADIOLOGIST EVAL & MGMT  11/01/2018   IR RADIOLOGIST EVAL & MGMT  03/21/2019   IR RADIOLOGIST EVAL & MGMT  11/08/2019   IR RADIOLOGIST EVAL & MGMT  07/01/2020   IR US GUIDE VASC ACCESS RIGHT  07/26/2017   IR US GUIDE VASC ACCESS RIGHT  08/11/2017   IR US GUIDE VASC ACCESS RIGHT  04/28/2018   LUNG LOBECTOMY  06/10/13   upper left lung   MULTIPLE TOOTH EXTRACTIONS     REMOVAL RETAINED LENS Right 11/17/2015   Procedure: REMOVAL RETAINED LENS FROAGMENTS RIGHT EYE;  Surgeon: [name], MD;  Location: [place];  Service: Ophthalmology;  Laterality: Right;   TONSILLECTOMY     UPPER GASTROINTESTINAL ENDOSCOPY  11-03-15   Dr [name]   VASECTOMY     VIDEO ASSISTED THORACOSCOPY (VATS)/WEDGE RESECTION Left 06/04/2013   Procedure: VIDEO ASSISTED THORACOSCOPY (VATS)/WEDGE RESECTION;  Surgeon: [name], MD;  Location: [place];  Service: Thoracic;  Laterality: Left;   VIDEO BRONCHOSCOPY N/A 06/04/2013   Procedure: VIDEO BRONCHOSCOPY;  Surgeon: [name], MD;  [facility];  Service: Thoracic;  Laterality: N/A;    FAMILY HISTORY: Family History  Problem Relation Age of Onset   Stroke Mother    Alzheimer's disease Mother    Hypertension Sister    Hyperlipidemia Sister    Hypertension Brother    Hyperlipidemia Brother    Diabetes Brother     ADVANCED DIRECTIVES (Y/N):  N  HEALTH MAINTENANCE: Social History   Tobacco Use   Smoking status: Former    Packs/day: 1.00    Years: 40.00    Pack years: 40.00    Types: Cigarettes    Quit date: 12/09/1998    Years since quitting: 22.0   Smokeless tobacco: Never  Vaping Use   Vaping Use: Never used  Substance Use Topics   Alcohol use: No   Drug use: No     Colonoscopy:  PAP:  Bone density:  Lipid panel:  Allergies  Allergen Reactions    Tape Itching    Surgical tapes     Current Outpatient Medications  Medication Sig Dispense Refill   aspirin EC 81 MG tablet Take 81 mg by mouth daily.      atorvastatin (LIPITOR) 40 MG tablet Take 1 tablet (40 mg total) by mouth daily. for cholesterol. 90 tablet 3   b complex vitamins capsule Take 1 capsule by mouth daily.     cholecalciferol (VITAMIN D3) 25 MCG (1000 UNIT) tablet Take 1,000 Units by mouth daily.     Cinnamon 500 MG capsule Take 500 mg by mouth at bedtime.      diphenhydrAMINE (BENADRYL) 25 mg capsule Take 25 mg by mouth every morning.      DM-APAP-CPM (CORICIDIN HBP PO) Take 2 tablets by mouth daily as needed (allergies/cold symptoms).     fluticasone furoate-vilanterol (BREO ELLIPTA) 100-25 MCG/INH AEPB Inhale 1 puff into the lungs daily. 28 each 11   glucose blood (ONETOUCH VERIO) test strip USE 1 STRIP TO CHECK GLUCOSE ONCE DAILY 100 each 3   losartan (COZAAR) 100 MG tablet Take 1 tablet (100 mg total) by mouth daily. 90 tablet 3   Multiple Vitamins-Minerals (MULTIVITAMIN WITH MINERALS) tablet Take 1 tablet by mouth daily.      polycarbophil (FIBERCON) 625 MG tablet Take 625 mg by mouth 2 (two) times daily.      vitamin C (ASCORBIC ACID) 500 MG tablet Take 500 mg by mouth daily.      zinc sulfate 220 (50 Zn) MG capsule Take 1 capsule (220 mg total) by mouth daily. 30 capsule 0   No current facility-administered medications for this visit.   Facility-Administered Medications Ordered in Other Visits  Medication Dose Route Frequency Provider Last Rate Last Admin   lanreotide acetate (SOMATULINE DEPOT) injection 120 mg  120 mg Subcutaneous Once Lloyd Huger, MD        OBJECTIVE: Vitals:   12/16/20 1014  BP: 129/89  Pulse: 63  Resp: 16  Temp: 98.1 F (36.7 C)  SpO2: 97%     Body mass index is 27.29 kg/m.    ECOG FS:0 - Asymptomatic  General: Well-developed, well-nourished, no acute distress. Eyes: Pink conjunctiva, anicteric sclera. HEENT:  Normocephalic, moist mucous membranes. Lungs: No audible wheezing or coughing. Heart: Regular rate and rhythm. Abdomen: Soft, nontender, no obvious distention. Musculoskeletal: No edema, cyanosis, or clubbing. Neuro: Alert, answering all questions appropriately. Cranial nerves grossly intact. Skin: No rashes or petechiae noted. Psych: Normal affect.   LAB RESULTS:  Lab Results  Component Value Date   NA  138 12/16/2020   K 4.2 12/16/2020   CL 103 12/16/2020   CO2 28 12/16/2020   GLUCOSE 128 (H) 12/16/2020   BUN 22 12/16/2020   CREATININE 0.91 12/16/2020   CALCIUM 9.4 12/16/2020   PROT 7.0 12/16/2020   ALBUMIN 4.2 12/16/2020   AST 27 12/16/2020   ALT 25 12/16/2020   ALKPHOS 118 12/16/2020   BILITOT 0.8 12/16/2020   GFRNONAA >60 12/16/2020   GFRAA >60 01/29/2020    Lab Results  Component Value Date   WBC 10.3 12/16/2020   NEUTROABS 7.3 12/16/2020   HGB 15.6 12/16/2020   HCT 47.6 12/16/2020   MCV 93.0 12/16/2020   PLT 171 12/16/2020     STUDIES: CT CHEST ABDOMEN PELVIS W CONTRAST  Result Date: 12/15/2020 CLINICAL DATA:  80 year old male with history of neuroendocrine carcinoma. Liver metastases. Follow-up study. EXAM: CT CHEST, ABDOMEN, AND PELVIS WITH CONTRAST TECHNIQUE: Multidetector CT imaging of the chest, abdomen and pelvis was performed following the standard protocol during bolus administration of intravenous contrast. CONTRAST:  139m OMNIPAQUE IOHEXOL 300 MG/ML  SOLN COMPARISON:  CT the chest, abdomen and pelvis 06/12/2020. FINDINGS: CT CHEST FINDINGS Cardiovascular: Heart size is normal. There is no significant pericardial fluid, thickening or pericardial calcification. There is aortic atherosclerosis, as well as atherosclerosis of the great vessels of the mediastinum and the coronary arteries, including calcified atherosclerotic plaque in the left main, left anterior descending and right coronary arteries. Mediastinum/Nodes: No pathologically enlarged mediastinal  or hilar lymph nodes. Esophagus is unremarkable in appearance. No axillary lymphadenopathy. Lungs/Pleura: Status post left upper lobectomy. Compensatory hyperexpansion of the left lower lobe. Again noted is some focal soft tissue prominence and enhancement which is nodular in the anterior wall of the left mainstem bronchus best appreciated on axial image 26 of series 2 where this measures up to 8 mm in diameter. No definite suspicious appearing pulmonary nodules or masses are noted. Calcified granuloma in the posterior aspect of the right upper lobe. No acute consolidative airspace disease. No pleural effusions. Diffuse bronchial wall thickening with moderate centrilobular and paraseptal emphysema. Additionally, there are widespread but patchy areas of ground-glass attenuation, septal thickening, cylindrical bronchiectasis and peripheral bronchiolectasis in the lungs, concerning for interstitial lung disease. Musculoskeletal: Multiple sclerotic lesions are again noted throughout the visualized axial skeleton, similar in size and number to the prior examination, largest of which is again an 11 mm sclerotic lesion in the left side of the T11 vertebral body. CT ABDOMEN PELVIS FINDINGS Hepatobiliary: Again noted is a partially exophytic hypovascular lesion extending off the inferior aspect of segment 3 of the liver (axial image 53 of series 2 and coronal image 37 of series 4) which measures a 2.1 x 1.9 x 2.0 cm, similar to the prior study. Other tiny 3 mm lesion in segment 5 of the liver seen on the prior examination is not confidently identified on today's study. No new suspicious hepatic lesions are noted. No intra or extrahepatic biliary ductal dilatation. Gallbladder appears nearly completely decompressed. Pancreas: No pancreatic mass. No pancreatic ductal dilatation. No pancreatic or peripancreatic fluid collections or inflammatory changes. Spleen: 1.2 cm hypovascular lesion in the central aspect of the spleen,  stable over numerous prior examinations, incompletely characterize, but favored to be benign. Adrenals/Urinary Tract: Focal cortical scarring in the lateral aspect of the interpolar region of the right kidney. Mild left renal atrophy. No suspicious renal lesions. No hydroureteronephrosis. 1 cm calculus lying dependent in the lumen of the urinary bladder. Urinary bladder is otherwise  normal in appearance. Bilateral adrenal glands are normal in appearance. Stomach/Bowel: The appearance of the stomach is normal. No pathologic dilatation of small bowel or colon. Numerous colonic diverticulae are noted, particularly in the descending colon and sigmoid colon, without surrounding inflammatory changes to suggest an acute diverticulitis at this time. Normal appendix. Vascular/Lymphatic: Aortic atherosclerosis, without evidence of aneurysm or dissection in the abdominal or pelvic vasculature. Fusiform ectasia of the distal left common iliac artery measuring up to 1.4 cm in diameter. No lymphadenopathy noted in the abdomen or pelvis. Reproductive: Prostate gland and seminal vesicles are unremarkable in appearance. Other: No significant volume of ascites.  No pneumoperitoneum. Musculoskeletal: Multiple sclerotic lesions are again noted throughout the visualized axial skeleton, similar to recent prior examinations, largest of which is in the inferior aspect of the L3 vertebral body measuring 11 mm. IMPRESSION: 1. Stable size of metastatic lesion in segment 3 of the liver. No other suspicious hepatic lesions are noted on today's examination. Previously noted 3 mm lesion in segment 5 of the liver is no longer apparent. 2. Persistent small nodular focus of enhancing soft tissue along the anterior wall of the left mainstem bronchus. This is concerning for locally recurrent neoplasm, and further evaluation with nonemergent bronchoscopy is suggested if clinically appropriate. 3. Numerous sclerotic lesions are again noted throughout  the visualized axial skeleton. Several of these are very small, well-defined with narrow zones of transition, compatible with benign bone islands. However, other lesions are less well-defined and new compared to more remote prior examinations, likely to represent metastatic lesions, particularly notable in the T11, L2 and L3 vertebral bodies, similar to the most recent prior examination. 4. Aortic atherosclerosis, in addition to left main and 2 vessel coronary artery disease. Assessment for potential risk factor modification, dietary therapy or pharmacologic therapy may be warranted, if clinically indicated. 5. Diffuse bronchial wall thickening with moderate centrilobular and paraseptal emphysema; imaging findings suggestive of underlying COPD. 6. There is also evidence of interstitial lung disease, with a spectrum of findings categorized as probable usual interstitial pneumonia (UIP) per current ATS guidelines. 7. 1 cm calculus in the dependent portion of the urinary bladder. 8. Severe colonic diverticulosis without evidence of acute diverticulitis at this time. 9. Additional incidental findings, as above. Electronically Signed   By: Vinnie Langton M.D.   On: 12/15/2020 11:10    ASSESSMENT: 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  PLAN:    1. 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 Patient underwent left upper lobe lobectomy in [place] on June 04, 2013. Final pathology results revealed a well differentiated neuroendocrine tumor with positive bronchial margins. Patient did not receive adjuvant XRT or chemotherapy at that time.  His most recent Y 90 ablation occurred on April 28, 2018.  Patient's chromogranin a levels are elevated, but relatively stable ranging from 213-261. Although more recently this is trended up to 527.2 and 536.8.  Today's result is pending.   Unclear the clinical significance.  CT scan results from December 15, 2020 reviewed independently and reported as above with stable disease in patient's liver and a persistent small nodular focus of enhancing soft tissue along the left mainstem bronchus.  Case discussed with pulmonary and they did not feel bronchoscopy is necessary at this time, but will consider one in 6 months after next imaging.  Proceed with 120 mg subcutaneous lanreotide today.  Return to clinic in 4  and 8 weeks with laboratory work and treatment only.  Patient will then return to clinic in 12 weeks for further evaluation and continuation of treatment.  Patient's next imaging will occur in approximately February 2023.   2.  Left mainstem endobronchial mass: CT scan results as above.  Consistent with neuroendocrine tumor.  Patient underwent his third and final bronchoscopy with therapeutic cryoablation ablation on November 26, 2019.  Continue follow-up with pulmonary as scheduled. 3.  Covid: Resolved.  4.  Urinary retention: Resolved. Foley has been removed.    I spent a total of 30 minutes reviewing chart data, face-to-face evaluation with the patient, counseling and coordination of care as detailed above.    Patient expressed understanding and was in agreement with this plan. He also understands that He can call clinic at any time with any questions, concerns, or complaints.   Cancer Staging Neuroendocrine carcinoma of lung [facility]) Staging form: Lung, AJCC 8th Edition - Clinical stage from 12/28/2016: Stage IVA (cT2a, cN0, cM1b) - Signed by Lloyd Huger, MD on 12/28/2016 Sites of metastasis: Liver   Lloyd Huger, MD   12/16/2020 12:26 PM

## 2020-12-15 ENCOUNTER — Other Ambulatory Visit: Payer: Self-pay

## 2020-12-15 ENCOUNTER — Ambulatory Visit (INDEPENDENT_AMBULATORY_CARE_PROVIDER_SITE_OTHER): Payer: Medicare Other | Admitting: Family Medicine

## 2020-12-15 ENCOUNTER — Encounter: Payer: Self-pay | Admitting: Family Medicine

## 2020-12-15 VITALS — BP 138/78 | HR 66 | Temp 97.7°F | Resp 14 | Ht 64.0 in | Wt 160.0 lb

## 2020-12-15 DIAGNOSIS — I4892 Unspecified atrial flutter: Secondary | ICD-10-CM

## 2020-12-15 DIAGNOSIS — J449 Chronic obstructive pulmonary disease, unspecified: Secondary | ICD-10-CM

## 2020-12-15 DIAGNOSIS — E119 Type 2 diabetes mellitus without complications: Secondary | ICD-10-CM

## 2020-12-15 DIAGNOSIS — E782 Mixed hyperlipidemia: Secondary | ICD-10-CM

## 2020-12-15 DIAGNOSIS — I10 Essential (primary) hypertension: Secondary | ICD-10-CM | POA: Diagnosis not present

## 2020-12-15 DIAGNOSIS — R338 Other retention of urine: Secondary | ICD-10-CM

## 2020-12-15 DIAGNOSIS — E118 Type 2 diabetes mellitus with unspecified complications: Secondary | ICD-10-CM

## 2020-12-15 DIAGNOSIS — N401 Enlarged prostate with lower urinary tract symptoms: Secondary | ICD-10-CM

## 2020-12-15 LAB — POCT GLYCOSYLATED HEMOGLOBIN (HGB A1C): Hemoglobin A1C: 6.4 % — AB (ref 4.0–5.6)

## 2020-12-15 MED ORDER — LOSARTAN POTASSIUM 100 MG PO TABS: 100.0000 mg | ORAL_TABLET | Freq: Every day | ORAL | 3 refills | Status: DC

## 2020-12-15 MED ORDER — ATORVASTATIN CALCIUM 40 MG PO TABS: 40.0000 mg | ORAL_TABLET | Freq: Every day | ORAL | 3 refills | Status: DC

## 2020-12-15 NOTE — Assessment & Plan Note (Signed)
At goal for age without meds, continue to monitor.

## 2020-12-15 NOTE — Patient Instructions (Addendum)
It was great to see you!  Our plans for today:  - We sent refills to your pharmacy. - Try generic claritin (loratidine) or zyrtec (cetirizine) for your drainage. Diphenhydramine is generic for benadryl and is not great taking long term especially as we get older (risk for excessive drowsiness, falls, and urinary retention).  Take care and seek immediate care sooner if you develop any concerns.   Dr. Ky Barban

## 2020-12-15 NOTE — Assessment & Plan Note (Signed)
Counseled on switching benadryl to 2nd generation antihistamine given h/o BPH and urinary retention, otherwise currently asymptomatic.

## 2020-12-15 NOTE — Progress Notes (Signed)
   SUBJECTIVE:   CHIEF COMPLAINT / HPI:   Hypertension, paroxysmal atrial flutter, CAD - Medications: losartan - Compliance: good - Checking BP at home: occasionally - Denies any SOB, CP, vision changes, LE edema, medication SEs, or symptoms of hypotension  Diabetes, Type 2 - Last A1c 6.5 - Medications: none - Compliance: n/a - Checking BG at home: yes, 120s - Eye exam: due - Foot exam: due - Statin: yes - Denies symptoms of hypoglycemia, polyuria, polydipsia, numbness extremities, foot ulcers/trauma  HLD - medications: lipitor 28m - compliance: good - medication SEs: none  Emphysema/COPD, lung cancer with mets, postinflammatory lung fibrosis - Medications: breo - follows with Pulm, IR. Recent CT C/A/P 8/5 with stable liver met and persistent lung nodule - Compliance: taking 3x per week   OBJECTIVE:   BP 138/78   Pulse 66   Temp 97.7 F (36.5 C)   Resp 14   Ht _0  (1.626 m)   Wt 160 lb (72.6 kg)   SpO2 97%   BMI 27.46 kg/m   Gen: well appearing, in NAD Card: RRR Lungs: CTAB Ext: WWP, no edema   ASSESSMENT/PLAN:   Essential hypertension At goal for age, no changes made today. Refills provided. Labs previously obtained.  Paroxysmal atrial flutter (HCC) Reg rhythm today.  [place] Counseled on daily breo use. Continue to follow with pulm.  Type 2 diabetes mellitus ([place] At goal for age without meds, continue to monitor.  Hyperlipidemia Tolerant of statin, refill provided.   BPH (benign prostatic hyperplasia) Counseled on switching benadryl to 2nd generation antihistamine given h/o BPH and urinary retention, otherwise currently asymptomatic.     AMyles Gip DO

## 2020-12-15 NOTE — Assessment & Plan Note (Signed)
Reg rhythm today.

## 2020-12-15 NOTE — Assessment & Plan Note (Signed)
Counseled on daily breo use. Continue to follow with pulm.

## 2020-12-15 NOTE — Assessment & Plan Note (Signed)
Tolerant of statin, refill provided.

## 2020-12-15 NOTE — Assessment & Plan Note (Signed)
At goal for age, no changes made today. Refills provided. Labs previously obtained.

## 2020-12-16 ENCOUNTER — Inpatient Hospital Stay: Payer: Medicare Other

## 2020-12-16 ENCOUNTER — Encounter: Payer: Self-pay | Admitting: Oncology

## 2020-12-16 ENCOUNTER — Inpatient Hospital Stay: Payer: Medicare Other | Attending: Oncology | Admitting: Oncology

## 2020-12-16 VITALS — BP 129/89 | HR 63 | Temp 98.1°F | Resp 16 | Ht 64.0 in | Wt 159.0 lb

## 2020-12-16 DIAGNOSIS — C7800 Secondary malignant neoplasm of unspecified lung: Secondary | ICD-10-CM | POA: Insufficient documentation

## 2020-12-16 DIAGNOSIS — Z79899 Other long term (current) drug therapy: Secondary | ICD-10-CM | POA: Insufficient documentation

## 2020-12-16 DIAGNOSIS — C787 Secondary malignant neoplasm of liver and intrahepatic bile duct: Secondary | ICD-10-CM | POA: Diagnosis present

## 2020-12-16 DIAGNOSIS — C7A8 Other malignant neuroendocrine tumors: Secondary | ICD-10-CM | POA: Diagnosis not present

## 2020-12-16 DIAGNOSIS — C7B8 Other secondary neuroendocrine tumors: Secondary | ICD-10-CM

## 2020-12-16 DIAGNOSIS — D3A8 Other benign neuroendocrine tumors: Secondary | ICD-10-CM | POA: Insufficient documentation

## 2020-12-16 DIAGNOSIS — Z902 Acquired absence of lung [part of]: Secondary | ICD-10-CM | POA: Diagnosis not present

## 2020-12-16 LAB — COMPREHENSIVE METABOLIC PANEL
ALT: 25 U/L (ref 0–44)
AST: 27 U/L (ref 15–41)
Albumin: 4.2 g/dL (ref 3.5–5.0)
Alkaline Phosphatase: 118 U/L (ref 38–126)
Anion gap: 7 (ref 5–15)
BUN: 22 mg/dL (ref 8–23)
CO2: 28 mmol/L (ref 22–32)
Calcium: 9.4 mg/dL (ref 8.9–10.3)
Chloride: 103 mmol/L (ref 98–111)
Creatinine, Ser: 0.91 mg/dL (ref 0.61–1.24)
GFR, Estimated: >60 mL/min (ref >60)
Glucose, Bld: 128 mg/dL — ABNORMAL HIGH (ref 70–99)
Potassium: 4.2 mmol/L (ref 3.5–5.1)
Sodium: 138 mmol/L (ref 135–145)
Total Bilirubin: 0.8 mg/dL (ref 0.3–1.2)
Total Protein: 7 g/dL (ref 6.5–8.1)

## 2020-12-16 LAB — CBC WITH DIFFERENTIAL/PLATELET
Abs Immature Granulocytes: 0.15 10*3/uL — ABNORMAL HIGH (ref 0.00–0.07)
Basophils Absolute: 0.1 10*3/uL (ref 0.0–0.1)
Basophils Relative: 1 %
Eosinophils Absolute: 0.1 10*3/uL (ref 0.0–0.5)
Eosinophils Relative: 1 %
HCT: 47.6 % (ref 39.0–52.0)
Hemoglobin: 15.6 g/dL (ref 13.0–17.0)
Immature Granulocytes: 2 %
Lymphocytes Relative: 10 %
Lymphs Abs: 1 10*3/uL (ref 0.7–4.0)
MCH: 30.5 pg (ref 26.0–34.0)
MCHC: 32.8 g/dL (ref 30.0–36.0)
MCV: 93 fL (ref 80.0–100.0)
Monocytes Absolute: 1.6 10*3/uL — ABNORMAL HIGH (ref 0.1–1.0)
Monocytes Relative: 15 %
Neutro Abs: 7.3 10*3/uL (ref 1.7–7.7)
Neutrophils Relative %: 71 %
Platelets: 171 10*3/uL (ref 150–400)
RBC: 5.12 MIL/uL (ref 4.22–5.81)
RDW: 14.6 % (ref 11.5–15.5)
WBC: 10.3 10*3/uL (ref 4.0–10.5)
nRBC: 0 % (ref 0.0–0.2)

## 2020-12-16 MED ORDER — LANREOTIDE ACETATE 120 MG/0.5ML ~~LOC~~ SOLN
120.0000 mg | Freq: Once | SUBCUTANEOUS | Status: AC
  Administered 2020-12-16: 120 mg via SUBCUTANEOUS
  Filled 2020-12-16: qty 120

## 2020-12-17 LAB — CHROMOGRANIN A: Chromogranin A (ng/mL): 573.1 ng/mL — ABNORMAL HIGH (ref 0.0–101.8)

## 2020-12-18 ENCOUNTER — Other Ambulatory Visit: Payer: Self-pay

## 2020-12-18 ENCOUNTER — Ambulatory Visit
Admission: RE | Admit: 2020-12-18 | Discharge: 2020-12-18 | Disposition: A | Discharge status: Home / Self Care | Payer: Medicare Other | Source: Ambulatory Visit | Attending: Interventional Radiology | Admitting: Interventional Radiology

## 2020-12-18 ENCOUNTER — Encounter: Payer: Self-pay | Admitting: *Deleted

## 2020-12-18 DIAGNOSIS — C7A8 Other malignant neuroendocrine tumors: Secondary | ICD-10-CM

## 2020-12-18 HISTORY — PX: IR RADIOLOGIST EVAL & MGMT: IMG5224

## 2020-12-18 NOTE — Progress Notes (Signed)
Patient ID: Jason Malone, male   DOB: 06/10/1940, 80 y.o.   MRN: [id number]       Chief Complaint:  2 and half years status post Y 90 radioembolization for metastatic neuroendocrine tumor to the liver  Referring Physician(s): [place]  History of Present Illness: Jason Malone is a 80 y.o. male with stage IV metastatic neuroendocrine tumor to the liver.  He is now 2-1/2 years out from whole liver Y 90 radioembolization.  Overall, his liver disease has been stable.  Most recent CT a 522 demonstrates a stable hepatic changes.  No enlarging or new liver abnormality.  Overall he continues to do very well.  No new complaints or symptoms.  No recent illness or fever.  Excellent functional status.  He remains very active.  He continues monthly Sandostatin injections.  Past Medical History:  Diagnosis Date   Allergic rhinitis    Benign prostatic hypertrophy with lower urinary tract symptoms (LUTS)    Chest pain    a. 05/2013 MV: EF 70%, no ischemia.   Diverticulitis    DIVERTICULOSIS   Enlarged prostate    Essential hypertension    CONTROLLED ON MEDS   Full dentures    upper and lower   H/O hypokalemia    Hx of atrial fibrillation without current medication    CARDIOLOGIST-DR GOLLAN/ [facility]   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 08/28/2016   Hyperlipemia    Hypomagnesemia    IFG (impaired fasting glucose)    Liver cancer (HCC)    Liver cancer ([place]) 12/08/2016   Liver mass, left lobe 08/28/2016   Probably hemangioma; will get MRI of liver, refer to GI   Lung cancer ([place])    a. carcinoid, left lung, Stage 1b (T2a, N0, cM0);  b. 05/2013 s/p VATS & LULobectomy.   Monocytosis    Osteopenia    Paroxysmal atrial flutter (HCC)    a. 03/2013->no recurrence;  b. CHA2DS2VASc = 2-->not currently on anticoagulation;  c. 05/2012 Echo: EF 55-60%, normal RV.   Wears glasses     Past Surgical History:  Procedure Laterality Date   BRONCHOSCOPY     04/06/2013   CARDIOVASCULAR STRESS  TEST  05/2013   a. No evidence of ischemia or infarct, EF 70%, no WMAs   CATARACT EXTRACTION W/PHACO Left 07/14/2015   Procedure: CATARACT EXTRACTION PHACO AND INTRAOCULAR LENS PLACEMENT (IOC);  Surgeon: [name], MD;  Location: [place];  Service: Ophthalmology;  Laterality: Left;   CATARACT EXTRACTION W/PHACO Right 10/01/2015   Procedure: CATARACT EXTRACTION PHACO AND INTRAOCULAR LENS PLACEMENT (IOC);  Surgeon: [name], MD;  Location: [place];  Service: Ophthalmology;  Laterality: Right;   COLONOSCOPY W/ POLYPECTOMY     bleed after-had to go to surgery to stop bleeding via colonoscopy   COLONOSCOPY WITH PROPOFOL N/A 11/19/2015   Procedure: COLONOSCOPY WITH PROPOFOL;  Surgeon: [name], MD;  [facility] ENDOSCOPY;  Service: Endoscopy;  Laterality: N/A;   DUPUYTREN CONTRACTURE RELEASE  12/15/2011   Procedure: DUPUYTREN CONTRACTURE RELEASE;  Surgeon: [name], MD;  Location: [place];  Service: Orthopedics;  Laterality: Left;  Fasciotomy left ring finger dupuytrens   ELECTROMAGNETIC NAVIGATION BROCHOSCOPY Left 09/19/2019   Procedure: ELECTROMAGNETIC NAVIGATION BRONCHOSCOPY;  Surgeon: Tyler Pita, MD;  Location: [facility] ORS;  Service: Cardiopulmonary;  Laterality: Left;   FASCIECTOMY Right 11/07/2018   Procedure: SEGMENTAL FASCIECTOMY RIGHT RING FINGER;  Surgeon: Daryll Brod, MD;  Location: [place];  Service: Orthopedics;  Laterality: Right;  AXILLARY BLOCK   FLEXIBLE BRONCHOSCOPY Left 11/26/2019   Procedure: FLEXIBLE BRONCHOSCOPY WITH CRYO THERAPY;  Surgeon: Tyler Pita, MD;  Location: [facility] ORS;  Service: Cardiopulmonary;  Laterality: Left;   IR ANGIOGRAM SELECTIVE EACH ADDITIONAL VESSEL  07/26/2017   IR ANGIOGRAM SELECTIVE EACH ADDITIONAL VESSEL  07/26/2017   IR ANGIOGRAM SELECTIVE EACH ADDITIONAL VESSEL  07/26/2017   IR ANGIOGRAM SELECTIVE EACH ADDITIONAL VESSEL  07/26/2017   IR ANGIOGRAM SELECTIVE  EACH ADDITIONAL VESSEL  07/26/2017   IR ANGIOGRAM SELECTIVE EACH ADDITIONAL VESSEL  08/11/2017   IR ANGIOGRAM SELECTIVE EACH ADDITIONAL VESSEL  04/28/2018   IR ANGIOGRAM VISCERAL SELECTIVE  07/26/2017   IR ANGIOGRAM VISCERAL SELECTIVE  08/11/2017   IR ANGIOGRAM VISCERAL SELECTIVE  04/28/2018   IR EMBO ARTERIAL NOT HEMORR HEMANG INC GUIDE ROADMAPPING  07/26/2017   IR EMBO TUMOR ORGAN ISCHEMIA INFARCT INC GUIDE ROADMAPPING  08/11/2017   IR EMBO TUMOR ORGAN ISCHEMIA INFARCT INC GUIDE ROADMAPPING  04/28/2018   IR RADIOLOGIST EVAL & MGMT  07/07/2017   IR RADIOLOGIST EVAL & MGMT  09/07/2017   IR RADIOLOGIST EVAL & MGMT  12/21/2017   IR RADIOLOGIST EVAL & MGMT  03/22/2018   IR RADIOLOGIST EVAL & MGMT  06/08/2018   IR RADIOLOGIST EVAL & MGMT  11/01/2018   IR RADIOLOGIST EVAL & MGMT  03/21/2019   IR RADIOLOGIST EVAL & MGMT  11/08/2019   IR RADIOLOGIST EVAL & MGMT  07/01/2020   IR US GUIDE VASC ACCESS RIGHT  07/26/2017   IR US GUIDE VASC ACCESS RIGHT  08/11/2017   IR US GUIDE VASC ACCESS RIGHT  04/28/2018   LUNG LOBECTOMY  06/10/13   upper left lung   MULTIPLE TOOTH EXTRACTIONS     REMOVAL RETAINED LENS Right 11/17/2015   Procedure: REMOVAL RETAINED LENS FROAGMENTS RIGHT EYE;  Surgeon: [name], MD;  Location: [place];  Service: Ophthalmology;  Laterality: Right;   TONSILLECTOMY     UPPER GASTROINTESTINAL ENDOSCOPY  11-03-15   Dr [name]   VASECTOMY     VIDEO ASSISTED THORACOSCOPY (VATS)/WEDGE RESECTION Left 06/04/2013   Procedure: VIDEO ASSISTED THORACOSCOPY (VATS)/WEDGE RESECTION;  Surgeon: [name], MD;  Location: [place];  Service: Thoracic;  Laterality: Left;   VIDEO BRONCHOSCOPY N/A 06/04/2013   Procedure: VIDEO BRONCHOSCOPY;  Surgeon: [name], MD;  Location: [facility] OR;  Service: Thoracic;  Laterality: N/A;    Allergies: Tape  Medications: Prior to Admission medications   Medication Sig Start Date End Date Taking? Authorizing Provider  aspirin EC 81 MG tablet Take 81 mg  by mouth daily.     [provider]  atorvastatin (LIPITOR) 40 MG tablet Take 1 tablet (40 mg total) by mouth daily. for cholesterol. 12/15/20   Myles Gip, DO  b complex vitamins capsule Take 1 capsule by mouth daily.    [provider]  cholecalciferol (VITAMIN D3) 25 MCG (1000 UNIT) tablet Take 1,000 Units by mouth daily.    [provider]  Cinnamon 500 MG capsule Take 500 mg by mouth at bedtime.     [provider]  diphenhydrAMINE (BENADRYL) 25 mg capsule Take 25 mg by mouth every morning.     [provider]  DM-APAP-CPM (CORICIDIN HBP PO) Take 2 tablets by mouth daily as needed (allergies/cold symptoms).    [provider]  fluticasone furoate-vilanterol (BREO ELLIPTA) 100-25 MCG/INH AEPB Inhale 1 puff into the lungs daily. 12/17/19   Tyler Pita, MD  glucose blood (ONETOUCH VERIO) test strip USE 1 STRIP TO CHECK GLUCOSE ONCE DAILY 07/07/20   Myles Gip, DO  losartan (COZAAR) 100 MG tablet Take 1 tablet (100 mg total) by mouth daily. 12/15/20 03/15/21  Myles Gip, DO  Multiple Vitamins-Minerals (MULTIVITAMIN WITH MINERALS) tablet Take 1 tablet by mouth daily.     [provider]  polycarbophil (FIBERCON) 625 MG tablet Take 625 mg by mouth 2 (two) times daily.     [provider]  vitamin C (ASCORBIC ACID) 500 MG tablet Take 500 mg by mouth daily.     [provider]  zinc sulfate 220 (50 Zn) MG capsule Take 1 capsule (220 mg total) by mouth daily. 06/18/19   Allie Bossier, MD     Family History  Problem Relation Age of Onset   Stroke Mother    Alzheimer's disease Mother    Hypertension Sister    Hyperlipidemia Sister    Hypertension Brother    Hyperlipidemia Brother    Diabetes Brother     Social History   Socioeconomic History   Marital status: Married    Spouse name: [name]   Number of children: 3   Years of education: Not on file   Highest education level: High school  graduate  Occupational History   Not on file  Tobacco Use   Smoking status: Former    Packs/day: 1.00    Years: 40.00    Pack years: 40.00    Types: Cigarettes    Quit date: 12/09/1998    Years since quitting: 22.0   Smokeless tobacco: Never  Vaping Use   Vaping Use: Never used  Substance and Sexual Activity   Alcohol use: No   Drug use: No   Sexual activity: Yes    Partners: Female  Other Topics Concern   Not on file  Social History Narrative   Not on file   Social Determinants of Health   Financial Resource Strain: Not on file  Food Insecurity: Not on file  Transportation Needs: Not on file  Physical Activity: Not on file  Stress: Not on file  Social Connections: Not on file    ECOG Status: 1 - Symptomatic but completely ambulatory  Review of Systems  Review of Systems: A 12 point ROS discussed and pertinent positives are indicated in the HPI above.  All other systems are negative.  Physical Exam No direct physical exam was performed   Telephone health visit only today Vital Signs: There were no vitals taken for this visit.  Imaging: CT CHEST ABDOMEN PELVIS W CONTRAST  Result Date: 12/15/2020 CLINICAL DATA:  80 year old male with history of neuroendocrine carcinoma. Liver metastases. Follow-up study. EXAM: CT CHEST, ABDOMEN, AND PELVIS WITH CONTRAST TECHNIQUE: Multidetector CT imaging of the chest, abdomen and pelvis was performed following the standard protocol during bolus administration of intravenous contrast. CONTRAST:  131m OMNIPAQUE IOHEXOL 300 MG/ML  SOLN COMPARISON:  CT the chest, abdomen and pelvis 06/12/2020. FINDINGS: CT CHEST FINDINGS Cardiovascular: Heart size is normal. There is no significant pericardial fluid, thickening or pericardial calcification. There is aortic atherosclerosis, as well as atherosclerosis of the great vessels of the mediastinum and the coronary arteries, including calcified atherosclerotic plaque in the left main, left anterior  descending and right coronary arteries. Mediastinum/Nodes: No pathologically enlarged mediastinal or hilar lymph nodes. Esophagus is unremarkable in appearance. No axillary lymphadenopathy. Lungs/Pleura: Status post left upper lobectomy. Compensatory hyperexpansion of the left lower lobe. Again noted is some focal soft  tissue prominence and enhancement which is nodular in the anterior wall of the left mainstem bronchus best appreciated on axial image 26 of series 2 where this measures up to 8 mm in diameter. No definite suspicious appearing pulmonary nodules or masses are noted. Calcified granuloma in the posterior aspect of the right upper lobe. No acute consolidative airspace disease. No pleural effusions. Diffuse bronchial wall thickening with moderate centrilobular and paraseptal emphysema. Additionally, there are widespread but patchy areas of ground-glass attenuation, septal thickening, cylindrical bronchiectasis and peripheral bronchiolectasis in the lungs, concerning for interstitial lung disease. Musculoskeletal: Multiple sclerotic lesions are again noted throughout the visualized axial skeleton, similar in size and number to the prior examination, largest of which is again an 11 mm sclerotic lesion in the left side of the T11 vertebral body. CT ABDOMEN PELVIS FINDINGS Hepatobiliary: Again noted is a partially exophytic hypovascular lesion extending off the inferior aspect of segment 3 of the liver (axial image 53 of series 2 and coronal image 37 of series 4) which measures a 2.1 x 1.9 x 2.0 cm, similar to the prior study. Other tiny 3 mm lesion in segment 5 of the liver seen on the prior examination is not confidently identified on today's study. No new suspicious hepatic lesions are noted. No intra or extrahepatic biliary ductal dilatation. Gallbladder appears nearly completely decompressed. Pancreas: No pancreatic mass. No pancreatic ductal dilatation. No pancreatic or peripancreatic fluid collections or  inflammatory changes. Spleen: 1.2 cm hypovascular lesion in the central aspect of the spleen, stable over numerous prior examinations, incompletely characterize, but favored to be benign. Adrenals/Urinary Tract: Focal cortical scarring in the lateral aspect of the interpolar region of the right kidney. Mild left renal atrophy. No suspicious renal lesions. No hydroureteronephrosis. 1 cm calculus lying dependent in the lumen of the urinary bladder. Urinary bladder is otherwise normal in appearance. Bilateral adrenal glands are normal in appearance. Stomach/Bowel: The appearance of the stomach is normal. No pathologic dilatation of small bowel or colon. Numerous colonic diverticulae are noted, particularly in the descending colon and sigmoid colon, without surrounding inflammatory changes to suggest an acute diverticulitis at this time. Normal appendix. Vascular/Lymphatic: Aortic atherosclerosis, without evidence of aneurysm or dissection in the abdominal or pelvic vasculature. Fusiform ectasia of the distal left common iliac artery measuring up to 1.4 cm in diameter. No lymphadenopathy noted in the abdomen or pelvis. Reproductive: Prostate gland and seminal vesicles are unremarkable in appearance. Other: No significant volume of ascites.  No pneumoperitoneum. Musculoskeletal: Multiple sclerotic lesions are again noted throughout the visualized axial skeleton, similar to recent prior examinations, largest of which is in the inferior aspect of the L3 vertebral body measuring 11 mm. IMPRESSION: 1. Stable size of metastatic lesion in segment 3 of the liver. No other suspicious hepatic lesions are noted on today's examination. Previously noted 3 mm lesion in segment 5 of the liver is no longer apparent. 2. Persistent small nodular focus of enhancing soft tissue along the anterior wall of the left mainstem bronchus. This is concerning for locally recurrent neoplasm, and further evaluation with nonemergent bronchoscopy is  suggested if clinically appropriate. 3. Numerous sclerotic lesions are again noted throughout the visualized axial skeleton. Several of these are very small, well-defined with narrow zones of transition, compatible with benign bone islands. However, other lesions are less well-defined and new compared to more remote prior examinations, likely to represent metastatic lesions, particularly notable in the T11, L2 and L3 vertebral bodies, similar to the most recent prior examination.  4. Aortic atherosclerosis, in addition to left main and 2 vessel coronary artery disease. Assessment for potential risk factor modification, dietary therapy or pharmacologic therapy may be warranted, if clinically indicated. 5. Diffuse bronchial wall thickening with moderate centrilobular and paraseptal emphysema; imaging findings suggestive of underlying COPD. 6. There is also evidence of interstitial lung disease, with a spectrum of findings categorized as probable usual interstitial pneumonia (UIP) per current ATS guidelines. 7. 1 cm calculus in the dependent portion of the urinary bladder. 8. Severe colonic diverticulosis without evidence of acute diverticulitis at this time. 9. Additional incidental findings, as above. Electronically Signed   By: Vinnie Langton M.D.   On: 12/15/2020 11:10    Labs:  CBC: Recent Labs    09/09/20 1012 10/07/20 1008 11/04/20 0948 12/16/20 1004  WBC 10.0 14.8* 10.5 10.3  HGB 15.5 15.0 15.3 15.6  HCT 46.8 45.4 46.2 47.6  PLT 172 164 186 171    COAGS: No results for input(s): INR, APTT in the last 8760 hours.  BMP: Recent Labs    12/21/19 1014 01/29/20 1106 02/26/20 1105 09/09/20 1012 10/07/20 1008 11/04/20 0948 12/12/20 1039 12/16/20 1004  NA 141 140   < > 138 140 136  --  138  K 4.8 4.3   < > 4.0 3.8 4.4  --  4.2  CL 104 105   < > 103 106 103  --  103  CO2 26 27   < > _0 --  28  GLUCOSE 105* 122*   < > 115* 123* 123*  --  128*  BUN 21 18   < > _1 --  22   CALCIUM 9.8 9.3   < > 9.4 9.1 9.4  --  9.4  CREATININE 0.97 1.00   < > 0.80 0.88 0.96 1.00 0.91  GFRNONAA >60 >60   < > >60 >60 >60  --  >60  GFRAA >60 >60  --   --   --   --   --   --    < > = values in this interval not displayed.    LIVER FUNCTION TESTS: Recent Labs    09/09/20 1012 10/07/20 1008 11/04/20 0948 12/16/20 1004  BILITOT 0.8 0.6 0.8 0.8  AST _2 ALT _3 ALKPHOS 132* 129* 120 118  PROT 7.0 6.7 6.7 7.0  ALBUMIN 4.0 3.7 3.9 4.2       Assessment and Plan:  2-1/2 years status post whole liver Y 90 radioembolization.  Stable hepatic changes.  No new or enlarging hepatic lesion.  Plan: Continue surveillance imaging with CT every 6 months at [facility].  He remains on monthly Sandostatin injections and is closely followed by Dr. Grayland Ormond in [place].  Electronically Signed: Greggory Keen 12/18/2020, 10:26 AM   I spent a total of    25 Minutes in remote  clinical consultation, greater than 50% of which was counseling/coordinating care for this patient with metastatic neuroendocrine tumor to the liver.    Visit type: Audio only (telephone). Audio (no video) only due to patient's lack of internet/smartphone capability. Alternative for in-person consultation at [facility], [address], [place], [place]. This visit type was conducted due to national recommendations for restrictions regarding the COVID-19 Pandemic (e.g. social distancing).  This format is felt to be most appropriate for this patient at this time.  All issues noted in this document were discussed and addressed.

## 2021-01-13 ENCOUNTER — Inpatient Hospital Stay: Payer: Medicare Other | Attending: Oncology

## 2021-01-13 ENCOUNTER — Inpatient Hospital Stay: Payer: Medicare Other

## 2021-01-13 DIAGNOSIS — C7A8 Other malignant neuroendocrine tumors: Secondary | ICD-10-CM

## 2021-01-13 DIAGNOSIS — C7A090 Malignant carcinoid tumor of the bronchus and lung: Secondary | ICD-10-CM | POA: Insufficient documentation

## 2021-01-13 DIAGNOSIS — C7B8 Other secondary neuroendocrine tumors: Secondary | ICD-10-CM

## 2021-01-13 DIAGNOSIS — Z79899 Other long term (current) drug therapy: Secondary | ICD-10-CM | POA: Diagnosis not present

## 2021-01-13 LAB — CBC WITH DIFFERENTIAL/PLATELET
Abs Immature Granulocytes: 0.05 10*3/uL (ref 0.00–0.07)
Basophils Absolute: 0.1 10*3/uL (ref 0.0–0.1)
Basophils Relative: 1 %
Eosinophils Absolute: 0.1 10*3/uL (ref 0.0–0.5)
Eosinophils Relative: 1 %
HCT: 47.5 % (ref 39.0–52.0)
Hemoglobin: 15.8 g/dL (ref 13.0–17.0)
Immature Granulocytes: 0 %
Lymphocytes Relative: 10 %
Lymphs Abs: 1.1 10*3/uL (ref 0.7–4.0)
MCH: 30.9 pg (ref 26.0–34.0)
MCHC: 33.3 g/dL (ref 30.0–36.0)
MCV: 92.8 fL (ref 80.0–100.0)
Monocytes Absolute: 1.8 10*3/uL — ABNORMAL HIGH (ref 0.1–1.0)
Monocytes Relative: 16 %
Neutro Abs: 8 10*3/uL — ABNORMAL HIGH (ref 1.7–7.7)
Neutrophils Relative %: 72 %
Platelets: 168 10*3/uL (ref 150–400)
RBC: 5.12 MIL/uL (ref 4.22–5.81)
RDW: 14.5 % (ref 11.5–15.5)
WBC: 11.2 10*3/uL — ABNORMAL HIGH (ref 4.0–10.5)
nRBC: 0 % (ref 0.0–0.2)

## 2021-01-13 LAB — COMPREHENSIVE METABOLIC PANEL
ALT: 22 U/L (ref 0–44)
AST: 23 U/L (ref 15–41)
Albumin: 4.1 g/dL (ref 3.5–5.0)
Alkaline Phosphatase: 125 U/L (ref 38–126)
Anion gap: 8 (ref 5–15)
BUN: 21 mg/dL (ref 8–23)
CO2: 27 mmol/L (ref 22–32)
Calcium: 9.6 mg/dL (ref 8.9–10.3)
Chloride: 102 mmol/L (ref 98–111)
Creatinine, Ser: 0.98 mg/dL (ref 0.61–1.24)
GFR, Estimated: >60 mL/min (ref >60)
Glucose, Bld: 129 mg/dL — ABNORMAL HIGH (ref 70–99)
Potassium: 4.5 mmol/L (ref 3.5–5.1)
Sodium: 137 mmol/L (ref 135–145)
Total Bilirubin: 0.7 mg/dL (ref 0.3–1.2)
Total Protein: 7.2 g/dL (ref 6.5–8.1)

## 2021-01-13 MED ORDER — LANREOTIDE ACETATE 120 MG/0.5ML ~~LOC~~ SOLN
120.0000 mg | Freq: Once | SUBCUTANEOUS | Status: AC
  Administered 2021-01-13: 120 mg via SUBCUTANEOUS
  Filled 2021-01-13: qty 120

## 2021-01-14 LAB — CHROMOGRANIN A: Chromogranin A (ng/mL): 509.4 ng/mL — ABNORMAL HIGH (ref 0.0–101.8)

## 2021-01-28 ENCOUNTER — Other Ambulatory Visit: Payer: Self-pay | Admitting: Pulmonary Disease

## 2021-01-30 ENCOUNTER — Ambulatory Visit (INDEPENDENT_AMBULATORY_CARE_PROVIDER_SITE_OTHER): Payer: Medicare Other

## 2021-01-30 ENCOUNTER — Other Ambulatory Visit: Payer: Self-pay

## 2021-01-30 DIAGNOSIS — Z23 Encounter for immunization: Secondary | ICD-10-CM | POA: Diagnosis not present

## 2021-02-10 ENCOUNTER — Inpatient Hospital Stay: Payer: Medicare Other | Attending: Oncology

## 2021-02-10 ENCOUNTER — Inpatient Hospital Stay: Payer: Medicare Other

## 2021-02-10 DIAGNOSIS — Z79899 Other long term (current) drug therapy: Secondary | ICD-10-CM | POA: Insufficient documentation

## 2021-02-10 DIAGNOSIS — C7A8 Other malignant neuroendocrine tumors: Secondary | ICD-10-CM

## 2021-02-10 DIAGNOSIS — C7A090 Malignant carcinoid tumor of the bronchus and lung: Secondary | ICD-10-CM | POA: Diagnosis not present

## 2021-02-10 LAB — CBC WITH DIFFERENTIAL/PLATELET
Abs Immature Granulocytes: 0.04 10*3/uL (ref 0.00–0.07)
Basophils Absolute: 0.1 10*3/uL (ref 0.0–0.1)
Basophils Relative: 1 %
Eosinophils Absolute: 0.1 10*3/uL (ref 0.0–0.5)
Eosinophils Relative: 1 %
HCT: 48.5 % (ref 39.0–52.0)
Hemoglobin: 15.9 g/dL (ref 13.0–17.0)
Immature Granulocytes: 0 %
Lymphocytes Relative: 9 %
Lymphs Abs: 1 10*3/uL (ref 0.7–4.0)
MCH: 30.2 pg (ref 26.0–34.0)
MCHC: 32.8 g/dL (ref 30.0–36.0)
MCV: 92.2 fL (ref 80.0–100.0)
Monocytes Absolute: 1.7 10*3/uL — ABNORMAL HIGH (ref 0.1–1.0)
Monocytes Relative: 15 %
Neutro Abs: 8.2 10*3/uL — ABNORMAL HIGH (ref 1.7–7.7)
Neutrophils Relative %: 74 %
Platelets: 178 10*3/uL (ref 150–400)
RBC: 5.26 MIL/uL (ref 4.22–5.81)
RDW: 14.2 % (ref 11.5–15.5)
WBC: 11.2 10*3/uL — ABNORMAL HIGH (ref 4.0–10.5)
nRBC: 0 % (ref 0.0–0.2)

## 2021-02-10 LAB — COMPREHENSIVE METABOLIC PANEL
ALT: 21 U/L (ref 0–44)
AST: 23 U/L (ref 15–41)
Albumin: 4 g/dL (ref 3.5–5.0)
Alkaline Phosphatase: 124 U/L (ref 38–126)
Anion gap: 8 (ref 5–15)
BUN: 21 mg/dL (ref 8–23)
CO2: 27 mmol/L (ref 22–32)
Calcium: 9.5 mg/dL (ref 8.9–10.3)
Chloride: 101 mmol/L (ref 98–111)
Creatinine, Ser: 0.93 mg/dL (ref 0.61–1.24)
GFR, Estimated: >60 mL/min (ref >60)
Glucose, Bld: 101 mg/dL — ABNORMAL HIGH (ref 70–99)
Potassium: 4.2 mmol/L (ref 3.5–5.1)
Sodium: 136 mmol/L (ref 135–145)
Total Bilirubin: 0.7 mg/dL (ref 0.3–1.2)
Total Protein: 7.1 g/dL (ref 6.5–8.1)

## 2021-02-10 MED ORDER — LANREOTIDE ACETATE 120 MG/0.5ML ~~LOC~~ SOLN
120.0000 mg | Freq: Once | SUBCUTANEOUS | Status: AC
  Administered 2021-02-10: 120 mg via SUBCUTANEOUS
  Filled 2021-02-10: qty 120

## 2021-02-11 LAB — CHROMOGRANIN A: Chromogranin A (ng/mL): 642.2 ng/mL — ABNORMAL HIGH (ref 0.0–101.8)

## 2021-03-11 ENCOUNTER — Ambulatory Visit: Payer: Medicare Other | Admitting: Oncology

## 2021-03-11 ENCOUNTER — Other Ambulatory Visit: Payer: Medicare Other

## 2021-03-11 ENCOUNTER — Ambulatory Visit: Payer: Medicare Other

## 2021-03-13 ENCOUNTER — Inpatient Hospital Stay: Payer: Medicare Other

## 2021-03-13 ENCOUNTER — Encounter: Payer: Self-pay | Admitting: Oncology

## 2021-03-13 ENCOUNTER — Other Ambulatory Visit: Payer: Self-pay

## 2021-03-13 ENCOUNTER — Inpatient Hospital Stay: Payer: Medicare Other | Attending: Oncology | Admitting: Oncology

## 2021-03-13 VITALS — BP 163/75 | HR 60 | Temp 96.1°F | Resp 17 | Wt 163.0 lb

## 2021-03-13 DIAGNOSIS — E119 Type 2 diabetes mellitus without complications: Secondary | ICD-10-CM | POA: Diagnosis not present

## 2021-03-13 DIAGNOSIS — N400 Enlarged prostate without lower urinary tract symptoms: Secondary | ICD-10-CM | POA: Diagnosis not present

## 2021-03-13 DIAGNOSIS — C7800 Secondary malignant neoplasm of unspecified lung: Secondary | ICD-10-CM | POA: Insufficient documentation

## 2021-03-13 DIAGNOSIS — C7B8 Other secondary neuroendocrine tumors: Secondary | ICD-10-CM

## 2021-03-13 DIAGNOSIS — E785 Hyperlipidemia, unspecified: Secondary | ICD-10-CM | POA: Diagnosis not present

## 2021-03-13 DIAGNOSIS — J449 Chronic obstructive pulmonary disease, unspecified: Secondary | ICD-10-CM | POA: Insufficient documentation

## 2021-03-13 DIAGNOSIS — C787 Secondary malignant neoplasm of liver and intrahepatic bile duct: Secondary | ICD-10-CM | POA: Insufficient documentation

## 2021-03-13 DIAGNOSIS — C7A8 Other malignant neuroendocrine tumors: Secondary | ICD-10-CM | POA: Diagnosis not present

## 2021-03-13 DIAGNOSIS — Z7982 Long term (current) use of aspirin: Secondary | ICD-10-CM | POA: Insufficient documentation

## 2021-03-13 DIAGNOSIS — I10 Essential (primary) hypertension: Secondary | ICD-10-CM | POA: Insufficient documentation

## 2021-03-13 DIAGNOSIS — Z79899 Other long term (current) drug therapy: Secondary | ICD-10-CM | POA: Diagnosis not present

## 2021-03-13 DIAGNOSIS — I4891 Unspecified atrial fibrillation: Secondary | ICD-10-CM | POA: Diagnosis not present

## 2021-03-13 DIAGNOSIS — I2510 Atherosclerotic heart disease of native coronary artery without angina pectoris: Secondary | ICD-10-CM | POA: Diagnosis not present

## 2021-03-13 LAB — CBC WITH DIFFERENTIAL/PLATELET
Abs Immature Granulocytes: 0.06 10*3/uL (ref 0.00–0.07)
Basophils Absolute: 0.1 10*3/uL (ref 0.0–0.1)
Basophils Relative: 1 %
Eosinophils Absolute: 0.1 10*3/uL (ref 0.0–0.5)
Eosinophils Relative: 1 %
HCT: 46.8 % (ref 39.0–52.0)
Hemoglobin: 15.6 g/dL (ref 13.0–17.0)
Immature Granulocytes: 1 %
Lymphocytes Relative: 11 %
Lymphs Abs: 1.1 10*3/uL (ref 0.7–4.0)
MCH: 30.6 pg (ref 26.0–34.0)
MCHC: 33.3 g/dL (ref 30.0–36.0)
MCV: 91.9 fL (ref 80.0–100.0)
Monocytes Absolute: 1.6 10*3/uL — ABNORMAL HIGH (ref 0.1–1.0)
Monocytes Relative: 16 %
Neutro Abs: 7.1 10*3/uL (ref 1.7–7.7)
Neutrophils Relative %: 70 %
Platelets: 168 10*3/uL (ref 150–400)
RBC: 5.09 MIL/uL (ref 4.22–5.81)
RDW: 14.1 % (ref 11.5–15.5)
WBC: 10.1 10*3/uL (ref 4.0–10.5)
nRBC: 0 % (ref 0.0–0.2)

## 2021-03-13 LAB — COMPREHENSIVE METABOLIC PANEL
ALT: 23 U/L (ref 0–44)
AST: 24 U/L (ref 15–41)
Albumin: 4.1 g/dL (ref 3.5–5.0)
Alkaline Phosphatase: 110 U/L (ref 38–126)
Anion gap: 5 (ref 5–15)
BUN: 20 mg/dL (ref 8–23)
CO2: 29 mmol/L (ref 22–32)
Calcium: 9.2 mg/dL (ref 8.9–10.3)
Chloride: 103 mmol/L (ref 98–111)
Creatinine, Ser: 1.04 mg/dL (ref 0.61–1.24)
GFR, Estimated: >60 mL/min (ref >60)
Glucose, Bld: 157 mg/dL — ABNORMAL HIGH (ref 70–99)
Potassium: 4.1 mmol/L (ref 3.5–5.1)
Sodium: 137 mmol/L (ref 135–145)
Total Bilirubin: 0.6 mg/dL (ref 0.3–1.2)
Total Protein: 7 g/dL (ref 6.5–8.1)

## 2021-03-13 MED ORDER — LANREOTIDE ACETATE 120 MG/0.5ML ~~LOC~~ SOLN
120.0000 mg | Freq: Once | SUBCUTANEOUS | Status: AC
  Administered 2021-03-13: 120 mg via SUBCUTANEOUS
  Filled 2021-03-13: qty 120

## 2021-03-13 NOTE — Progress Notes (Signed)
Patient tolerated lanreotide injection well today, no concerns voiced. Patient discharged with wife, stable.

## 2021-03-13 NOTE — Progress Notes (Signed)
[place]  [phone] [phone]) [phone]  ID: Jason Malone OB: 03-15-1941  MR#: [id number]  ENI#:[id number]  Patient Care Team: Towanda Malkin, MD as PCP - General (Internal Medicine) Minna Merritts, MD as PCP - Cardiology (Cardiology) [name], Forest Gleason, MD (General Surgery) Nestor Lewandowsky, MD (Inactive) as Referring Physician (Cardiothoracic Surgery) Minna Merritts, MD as Consulting Physician (Cardiology) [name], MD (Inactive) as Consulting Physician (Cardiothoracic Surgery) Lloyd Huger, MD as Consulting Physician (Oncology) Sanda Klein Satira Anis, MD as Attending Physician (Family Medicine)   CHIEF COMPLAINT: 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  INTERVAL HISTORY: Mr. Branch is a 80 year old male with past medical history significant for atrial flutter, hypertension, hyperlipidemia, CAD, COPD, type 2 diabetes who is followed Dr. Grayland Ormond for stage IV neuroendocrine tumor of lung with metastatic disease to the liver.  He is currently on monthly lanreotide injections last given on 02/10/2021.   Reports occasional weakness and fatigue but otherwise is doing well.  He is with his wife today.  Denies any abdominal concerns or new respiratory issues.  Denies any recent hospitalizations.  Tolerating Sandostatin injections well except for occasional hematoma at injection site.  Was seen by interventional radiology for follow-up for Y 90 radioembolization for metastatic neuroendocrine tumor of the liver.  Per note, everything was stable.   REVIEW OF SYSTEMS:   Review of Systems  Constitutional: Negative.  Negative for chills, fever, malaise/fatigue and weight loss.  HENT:  Negative for congestion, ear pain and tinnitus.   Eyes: Negative.  Negative for blurred vision and double vision.  Respiratory: Negative.  Negative for cough, sputum production and shortness of breath.   Cardiovascular: Negative.   Negative for chest pain, palpitations and leg swelling.  Gastrointestinal: Negative.  Negative for abdominal pain, constipation, diarrhea, nausea and vomiting.  Genitourinary:  Negative for dysuria, frequency and urgency.  Musculoskeletal:  Negative for back pain and falls.  Skin: Negative.  Negative for rash.  Neurological: Negative.  Negative for weakness and headaches.  Endo/Heme/Allergies: Negative.  Does not bruise/bleed easily.  Psychiatric/Behavioral: Negative.  Negative for depression. The patient is not nervous/anxious and does not have insomnia.    As per HPI. Otherwise, a complete review of systems is negative.  PAST MEDICAL HISTORY: Past Medical History:  Diagnosis Date   Allergic rhinitis    Benign prostatic hypertrophy with lower urinary tract symptoms (LUTS)    Chest pain    a. 05/2013 MV: EF 70%, no ischemia.   Diverticulitis    DIVERTICULOSIS   Enlarged prostate    Essential hypertension    CONTROLLED ON MEDS   Full dentures    upper and lower   H/O hypokalemia    Hx of atrial fibrillation without current medication    CARDIOLOGIST-DR GOLLAN/ [facility]   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 08/28/2016   Hyperlipemia    Hypomagnesemia    IFG (impaired fasting glucose)    Liver cancer (HCC)    Liver cancer ([place]) 12/08/2016   Liver mass, left lobe 08/28/2016   Probably hemangioma; will get MRI of liver, refer to GI   Lung cancer ([place])    a. carcinoid, left lung, Stage 1b (T2a, N0, cM0);  b. 05/2013 s/p VATS & LULobectomy.   Monocytosis    Osteopenia    Paroxysmal atrial flutter (HCC)    a. 03/2013->no recurrence;  b. CHA2DS2VASc = 2-->not currently on anticoagulation;  c. 05/2012 Echo: EF 55-60%, normal  RV.   Wears glasses     PAST SURGICAL HISTORY: Past Surgical History:  Procedure Laterality Date   BRONCHOSCOPY     04/06/2013   CARDIOVASCULAR STRESS TEST  05/2013   a. No evidence of ischemia or infarct, EF 70%, no WMAs   CATARACT EXTRACTION  W/PHACO Left 07/14/2015   Procedure: CATARACT EXTRACTION PHACO AND INTRAOCULAR LENS PLACEMENT (IOC);  Surgeon: [name], MD;  Location: [place];  Service: Ophthalmology;  Laterality: Left;   CATARACT EXTRACTION W/PHACO Right 10/01/2015   Procedure: CATARACT EXTRACTION PHACO AND INTRAOCULAR LENS PLACEMENT (IOC);  Surgeon: [name], MD;  Location: [place];  Service: Ophthalmology;  Laterality: Right;   COLONOSCOPY W/ POLYPECTOMY     bleed after-had to go to surgery to stop bleeding via colonoscopy   COLONOSCOPY WITH PROPOFOL N/A 11/19/2015   Procedure: COLONOSCOPY WITH PROPOFOL;  Surgeon: [name], MD;  [facility];  Service: Endoscopy;  Laterality: N/A;   DUPUYTREN CONTRACTURE RELEASE  12/15/2011   Procedure: DUPUYTREN CONTRACTURE RELEASE;  Surgeon: [name], MD;  Location: [place];  Service: Orthopedics;  Laterality: Left;  Fasciotomy left ring finger dupuytrens   ELECTROMAGNETIC NAVIGATION BROCHOSCOPY Left 09/19/2019   Procedure: ELECTROMAGNETIC NAVIGATION BRONCHOSCOPY;  Surgeon: Tyler Pita, MD;  Location: [facility];  Service: Cardiopulmonary;  Laterality: Left;   FASCIECTOMY Right 11/07/2018   Procedure: SEGMENTAL FASCIECTOMY RIGHT RING FINGER;  Surgeon: Daryll Brod, MD;  Location: [place];  Service: Orthopedics;  Laterality: Right;  AXILLARY BLOCK   FLEXIBLE BRONCHOSCOPY Left 11/26/2019   Procedure: FLEXIBLE BRONCHOSCOPY WITH CRYO THERAPY;  Surgeon: Tyler Pita, MD;  Location: [facility];  Service: Cardiopulmonary;  Laterality: Left;   IR ANGIOGRAM SELECTIVE EACH ADDITIONAL VESSEL  07/26/2017   IR ANGIOGRAM SELECTIVE EACH ADDITIONAL VESSEL  07/26/2017   IR ANGIOGRAM SELECTIVE EACH ADDITIONAL VESSEL  07/26/2017   IR ANGIOGRAM SELECTIVE EACH ADDITIONAL VESSEL  07/26/2017   IR ANGIOGRAM SELECTIVE EACH ADDITIONAL VESSEL  07/26/2017   IR ANGIOGRAM SELECTIVE EACH ADDITIONAL VESSEL  08/11/2017    IR ANGIOGRAM SELECTIVE EACH ADDITIONAL VESSEL  04/28/2018   IR ANGIOGRAM VISCERAL SELECTIVE  07/26/2017   IR ANGIOGRAM VISCERAL SELECTIVE  08/11/2017   IR ANGIOGRAM VISCERAL SELECTIVE  04/28/2018   IR EMBO ARTERIAL NOT HEMORR HEMANG INC GUIDE ROADMAPPING  07/26/2017   IR EMBO TUMOR ORGAN ISCHEMIA INFARCT INC GUIDE ROADMAPPING  08/11/2017   IR EMBO TUMOR ORGAN ISCHEMIA INFARCT INC GUIDE ROADMAPPING  04/28/2018   IR RADIOLOGIST EVAL & MGMT  07/07/2017   IR RADIOLOGIST EVAL & MGMT  09/07/2017   IR RADIOLOGIST EVAL & MGMT  12/21/2017   IR RADIOLOGIST EVAL & MGMT  03/22/2018   IR RADIOLOGIST EVAL & MGMT  06/08/2018   IR RADIOLOGIST EVAL & MGMT  11/01/2018   IR RADIOLOGIST EVAL & MGMT  03/21/2019   IR RADIOLOGIST EVAL & MGMT  11/08/2019   IR RADIOLOGIST EVAL & MGMT  07/01/2020   IR RADIOLOGIST EVAL & MGMT  12/18/2020   IR US GUIDE VASC ACCESS RIGHT  07/26/2017   IR US GUIDE VASC ACCESS RIGHT  08/11/2017   IR US GUIDE VASC ACCESS RIGHT  04/28/2018   LUNG LOBECTOMY  06/10/13   upper left lung   MULTIPLE TOOTH EXTRACTIONS     REMOVAL RETAINED LENS Right 11/17/2015   Procedure: REMOVAL RETAINED LENS FROAGMENTS RIGHT EYE;  Surgeon: [name], MD;  Location: [place];  Service: Ophthalmology;  Laterality: Right;  TONSILLECTOMY     UPPER GASTROINTESTINAL ENDOSCOPY  11-03-15   Dr [name]   VASECTOMY     VIDEO ASSISTED THORACOSCOPY (VATS)/WEDGE RESECTION Left 06/04/2013   Procedure: VIDEO ASSISTED THORACOSCOPY (VATS)/WEDGE RESECTION;  Surgeon: [name], MD;  Location: [place];  Service: Thoracic;  Laterality: Left;   VIDEO BRONCHOSCOPY N/A 06/04/2013   Procedure: VIDEO BRONCHOSCOPY;  Surgeon: [name], MD;  Location: [place];  Service: Thoracic;  Laterality: N/A;    FAMILY HISTORY: Family History  Problem Relation Age of Onset   Stroke Mother    Alzheimer's disease Mother    Hypertension Sister    Hyperlipidemia Sister    Hypertension Brother    Hyperlipidemia Brother     Diabetes Brother     ADVANCED DIRECTIVES (Y/N):  N  HEALTH MAINTENANCE: Social History   Tobacco Use   Smoking status: Former    Packs/day: 1.00    Years: 40.00    Pack years: 40.00    Types: Cigarettes    Quit date: 12/09/1998    Years since quitting: 22.2   Smokeless tobacco: Never  Vaping Use   Vaping Use: Never used  Substance Use Topics   Alcohol use: No   Drug use: No     Colonoscopy:  PAP:  Bone density:  Lipid panel:  Allergies  Allergen Reactions   Tape Itching    Surgical tapes     Current Outpatient Medications  Medication Sig Dispense Refill   aspirin EC 81 MG tablet Take 81 mg by mouth daily.      atorvastatin (LIPITOR) 40 MG tablet Take 1 tablet (40 mg total) by mouth daily. for cholesterol. 90 tablet 3   b complex vitamins capsule Take 1 capsule by mouth daily.     BREO ELLIPTA 100-25 MCG/INH AEPB Inhale 1 puff by mouth once daily 60 each 0   cholecalciferol (VITAMIN D3) 25 MCG (1000 UNIT) tablet Take 1,000 Units by mouth daily.     Cinnamon 500 MG capsule Take 500 mg by mouth at bedtime.      diphenhydrAMINE (BENADRYL) 25 mg capsule Take 25 mg by mouth every morning.      DM-APAP-CPM (CORICIDIN HBP PO) Take 2 tablets by mouth daily as needed (allergies/cold symptoms).     glucose blood (ONETOUCH VERIO) test strip USE 1 STRIP TO CHECK GLUCOSE ONCE DAILY 100 each 3   losartan (COZAAR) 100 MG tablet Take 1 tablet (100 mg total) by mouth daily. 90 tablet 3   Multiple Vitamins-Minerals (MULTIVITAMIN WITH MINERALS) tablet Take 1 tablet by mouth daily.      polycarbophil (FIBERCON) 625 MG tablet Take 625 mg by mouth 2 (two) times daily.      vitamin C (ASCORBIC ACID) 500 MG tablet Take 500 mg by mouth daily.      zinc sulfate 220 (50 Zn) MG capsule Take 1 capsule (220 mg total) by mouth daily. 30 capsule 0   No current facility-administered medications for this visit.   Facility-Administered Medications Ordered in Other Visits  Medication Dose Route  Frequency Provider Last Rate Last Admin   lanreotide acetate (SOMATULINE DEPOT) injection 120 mg  120 mg Subcutaneous Once Lloyd Huger, MD        OBJECTIVE: Vitals:   03/13/21 0950  BP: (!) 163/75  Pulse: 60  Resp: 17  Temp: (!) 96.1 F (35.6 C)  SpO2: 99%     Body mass index is 27.98 kg/m.    ECOG FS:0 - Asymptomatic  Physical  Exam Constitutional:      Appearance: Normal appearance. He is obese.  HENT:     Head: Normocephalic and atraumatic.  Eyes:     Pupils: Pupils are equal, round, and reactive to light.  Cardiovascular:     Rate and Rhythm: Normal rate and regular rhythm.     Heart sounds: Normal heart sounds. No murmur heard. Pulmonary:     Effort: Pulmonary effort is normal.     Breath sounds: Normal breath sounds. No wheezing.  Abdominal:     General: Bowel sounds are normal. There is no distension.     Palpations: Abdomen is soft.     Tenderness: There is no abdominal tenderness.  Musculoskeletal:        General: Normal range of motion.     Cervical back: Normal range of motion.  Skin:    General: Skin is warm and dry.     Findings: No rash.  Neurological:     Mental Status: He is alert and oriented to person, place, and time.     Cranial Nerves: No cranial nerve deficit.  Psychiatric:        Judgment: Judgment normal.     LAB RESULTS:  Lab Results  Component Value Date   NA 136 02/10/2021   K 4.2 02/10/2021   CL 101 02/10/2021   CO2 27 02/10/2021   GLUCOSE 101 (H) 02/10/2021   BUN 21 02/10/2021   CREATININE 0.93 02/10/2021   CALCIUM 9.5 02/10/2021   PROT 7.1 02/10/2021   ALBUMIN 4.0 02/10/2021   AST 23 02/10/2021   ALT 21 02/10/2021   ALKPHOS 124 02/10/2021   BILITOT 0.7 02/10/2021   GFRNONAA >60 02/10/2021   GFRAA >60 01/29/2020    Lab Results  Component Value Date   WBC 10.1 03/13/2021   NEUTROABS 7.1 03/13/2021   HGB 15.6 03/13/2021   HCT 46.8 03/13/2021   MCV 91.9 03/13/2021   PLT 168 03/13/2021     STUDIES: No  results found.  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  PLAN:    1. Stage IVA neuroendocrine tumor of the lung metastatic to the liver:  Patient underwent left upper lobe lobectomy on 06/04/2013 and final pathology revealed well-differentiated neuroendocrine tumor with positive bronchial margins.  He did not receive adjuvant XRT or chemotherapy at that time.  Had Y 90 ablation on 04/28/2018 to liver [place].  Chromogranin A levels have been elevated but were relatively stable between 200-300 until recently.  Chromogranin A level from 02/10/2021 was 642.2 and today's is pending.  Most recent imaging from 12/15/2020 showed stable disease in patient's liver and small nodular focus of enhancing soft tissue along the mainstem bronchus.  Bronchoscopy is not necessary at this time but will continue imaging every 6 months.  Labs from today are fairly unremarkable.  Proceed with 120 mg subcu lanreotide today.  He will continue to return monthly for labs and lanreotide injection and will see Dr. Grayland Ormond back in 12 weeks.  Plan on imaging in February 2023.  He is also followed by Dr. Patsey Berthold whom he sees in less than 1 month.  Disposition-continue monthly Sandostatin injections with labs and see Dr. Grayland Ormond back in 3 months after CT scan to discuss results.  We will call patient with chromogranin A level from today.  Wife is concerned about increasing levels.  I spent 25 minutes dedicated to the care of this patient (face-to-face and non-face-to-face) on the date of the encounter to  include what is described in the assessment and plan.  Patient expressed understanding and was in agreement with this plan. He also understands that He can call clinic at any time with any questions, concerns, or complaints.   Cancer Staging Neuroendocrine carcinoma of lung [facility]) Staging form: Lung, AJCC 8th Edition - Clinical stage from 12/28/2016: Stage IVA (cT2a, cN0, cM1b) - Signed by  Lloyd Huger, MD on 12/28/2016 Sites of metastasis: Liver   Jacquelin Hawking, NP   03/13/2021 9:54 AM

## 2021-03-13 NOTE — Progress Notes (Signed)
Patient here for oncology follow-up appointment, expresses no complaints or concerns at this time.    

## 2021-03-17 LAB — CHROMOGRANIN A: Chromogranin A (ng/mL): 634.3 ng/mL — ABNORMAL HIGH (ref 0.0–101.8)

## 2021-03-20 ENCOUNTER — Telehealth: Payer: Self-pay | Admitting: Pulmonary Disease

## 2021-03-20 DIAGNOSIS — J449 Chronic obstructive pulmonary disease, unspecified: Secondary | ICD-10-CM

## 2021-03-23 MED ORDER — FLUTICASONE FUROATE-VILANTEROL 100-25 MCG/ACT IN AEPB: 1.0000 | INHALATION_SPRAY | Freq: Every day | RESPIRATORY_TRACT | Status: DC

## 2021-03-23 MED ORDER — FLUTICASONE FUROATE-VILANTEROL 100-25 MCG/ACT IN AEPB: 1.0000 | INHALATION_SPRAY | Freq: Every day | RESPIRATORY_TRACT | 5 refills | Status: DC

## 2021-03-23 NOTE — Telephone Encounter (Signed)
Called and spoke to patient about RX for [facility] sent in [place] to the pharmacy nothing further needed.

## 2021-04-09 ENCOUNTER — Other Ambulatory Visit: Payer: Self-pay

## 2021-04-09 ENCOUNTER — Encounter: Payer: Self-pay | Admitting: Pulmonary Disease

## 2021-04-09 ENCOUNTER — Ambulatory Visit (INDEPENDENT_AMBULATORY_CARE_PROVIDER_SITE_OTHER): Payer: Medicare Other | Admitting: Pulmonary Disease

## 2021-04-09 VITALS — BP 150/76 | HR 56 | Temp 97.3°F | Ht 64.0 in | Wt 164.6 lb

## 2021-04-09 DIAGNOSIS — J8410 Pulmonary fibrosis, unspecified: Secondary | ICD-10-CM

## 2021-04-09 DIAGNOSIS — C7A8 Other malignant neuroendocrine tumors: Secondary | ICD-10-CM

## 2021-04-09 DIAGNOSIS — J300 Vasomotor rhinitis: Secondary | ICD-10-CM

## 2021-04-09 DIAGNOSIS — J449 Chronic obstructive pulmonary disease, unspecified: Secondary | ICD-10-CM

## 2021-04-09 MED ORDER — ALBUTEROL SULFATE HFA 108 (90 BASE) MCG/ACT IN AERS: 2.0000 | INHALATION_SPRAY | Freq: Four times a day (QID) | RESPIRATORY_TRACT | 1 refills | Status: DC | PRN

## 2021-04-09 MED ORDER — IPRATROPIUM BROMIDE 0.03 % NA SOLN: 2.0000 | Freq: Three times a day (TID) | NASAL | 3 refills | Status: DC

## 2021-04-09 NOTE — Patient Instructions (Signed)
We have sent a prescription for a nasal spray to see if this helps with your runny nose and cough.  You can stop the Breo (blue and white inhaler).  You may continue using a rescue inhaler (albuterol) a prescription has been sent to your pharmacy to have on hand just in case.  We we will see him in follow-up after the first week in February to discuss your repeat CT scan that has been scheduled for then.  Call sooner should any new problems arise.

## 2021-04-09 NOTE — Progress Notes (Signed)
Subjective:    Patient ID: Jason Malone, male    DOB: 04-12-1941, 80 y.o.   MRN: [id number] Chief Complaint  Patient presents with   Follow-up    COPD-    HPI [place] is a 80 year old former smoker with a history of stage IVa neuroendocrine tumor of the lung metastatic to the liver who presents for follow-up of the same as well as for COPD and cough.  His last visit here was on 20 November 2020.  The patient underwent resection in 2015 however had recurrence noted in August 2018 when liver involvement was noted.  In January 2021 he noted to have severe case of COVID-19 and was admitted to the [facility].  CT scan performed on 11 June 2019 showed typical parenchymal involvement of COVID-19 on the right lung.  The left lung had a lobulated mass in the left mainstem bronchus that appeared to be arising from the suture line from prior resection.  He has undergone cryoablation to this area in 3 separate occasions namely 19 Sep 2019, 10 October 2019 and finally on 26 November 2019.  On these occasions the mass was debulked and significantly reduced in size.  Minimal residual was noted at the last session.  Biopsies were consistent with neuroendocrine carcinoma (carcinoid).  He had a CT chest abdomen and pelvis performed on 12 December 2020 that shows that he may have recurrence at this suture line.  Currently this is not obstructing the bronchus and the patient has not noticed any significant shortness of breath over his baseline.  Cough only occurs when he uses [facility] and he has had to limit the use of this.  Not note that the [facility] helps him.  He does note that occasional use of albuterol does help him.  He has not had any fevers, chills or sweats.  Neck CT is scheduled for February 2023.  He has had some issues with vasomotor rhinitis but otherwise he voices no other complaint today.  Overall he looks well and feels well.  Review of Systems A 10 point review of systems was performed and it is as noted  above otherwise negative.  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   Respiratory failure ([place]) 06/11/2019   Elevated LFTs 06/11/2019   Pneumonia due to COVID-19 virus 06/10/2019   Contracture of palmar fascia 10/11/2018   Microalbuminuria due to type 2 diabetes mellitus ([place]) 04/12/2018   Type 2 diabetes mellitus ([place]) 04/10/2018   Osteopenia determined by x-ray 01/07/2017   Neuroendocrine carcinoma of lung ([place]) 12/28/2016   Low back pain 12/17/2016   Liver mass, left lobe 08/28/2016   Aortic atherosclerosis ([place]) 04/06/2016   Coronary artery disease involving native heart without angina pectoris 04/06/2016   History of colonic polyps 10/28/2015   Medication monitoring encounter 08/15/2015   Paroxysmal atrial flutter (HCC)    Elevated serum alkaline phosphatase level 03/05/2015   Allergic rhinitis    BPH without urinary obstruction    Hyperlipidemia 02/13/2014   Orthostasis 07/18/2013   Sinus bradycardia  07/18/2013   S/P lobectomy of lung 06/04/2013   Atrial flutter ([place]) 04/09/2013   Smoking history 04/09/2013   Essential hypertension 04/09/2013   Rectal bleeding 12/15/2012   Social History   Tobacco Use   Smoking status: Former    Packs/day: 1.00    Years: 40.00    Pack years: 40.00    Types: Cigarettes    Quit date: 12/09/1998    Years since quitting: 22.3   Smokeless tobacco: Never  Substance Use Topics   Alcohol use: No   Allergies  Allergen Reactions   Tape Itching     Surgical tapes    Current Meds  Medication Sig   aspirin EC 81 MG tablet Take 81 mg by mouth daily.    atorvastatin (LIPITOR) 40 MG tablet Take 1 tablet (40 mg total) by mouth daily. for cholesterol.   b complex vitamins capsule Take 1 capsule by mouth daily.   BREO ELLIPTA 100-25 MCG/INH AEPB Inhale 1 puff by mouth once daily   cholecalciferol (VITAMIN D3) 25 MCG (1000 UNIT) tablet Take 1,000 Units by mouth daily.   Cinnamon 500 MG capsule Take 500 mg by mouth at bedtime.    diphenhydrAMINE (BENADRYL) 25 mg capsule Take 25 mg by mouth every morning.    DM-APAP-CPM (CORICIDIN HBP PO) Take 2 tablets by mouth daily as needed (allergies/cold symptoms).   fluticasone furoate-vilanterol (BREO ELLIPTA) 100-25 MCG/ACT AEPB Inhale 1 puff into the lungs daily.   glucose blood (ONETOUCH VERIO) test strip USE 1 STRIP TO CHECK GLUCOSE ONCE DAILY   losartan (COZAAR) 100 MG tablet Take 1 tablet (100 mg total) by mouth daily.   Multiple Vitamins-Minerals (MULTIVITAMIN WITH MINERALS) tablet Take 1 tablet by mouth daily.    polycarbophil (FIBERCON) 625 MG tablet Take 625 mg by mouth 2 (two) times daily.    vitamin C (ASCORBIC ACID) 500 MG tablet Take 500 mg by mouth daily.    zinc sulfate 220 (50 Zn) MG capsule Take 1 capsule (220 mg total) by mouth daily.   Immunization History  Administered Date(s) Administered   Fluad Quad(high Dose 65+) 01/24/2019, 01/23/2020, 01/30/2021   Influenza, High Dose Seasonal PF 02/12/2016, 02/14/2017, 01/17/2018   Influenza,inj,Quad PF,6+ Mos 02/14/2015   PFIZER(Purple Top)SARS-COV-2 Vaccination 05/24/2019, 10/02/2019, 04/14/2020   Pneumococcal Conjugate-13 02/01/2014   Pneumococcal Polysaccharide-23 05/10/2008       Objective:   Physical Exam BP (!) 150/76 (BP Location: Left Arm, Patient Position: Sitting, Cuff Size: Normal)   Pulse (!) 56   Temp (!) 97.3 F (36.3 C) (Oral)   Ht 5\' 4"  (1.626 m)   Wt 164 lb 9.6 oz (74.7 kg)   SpO2 96%   BMI 28.25 kg/m    GENERAL: Awake and alert, well developed, well nourished gentleman, in no respiratory distress, fully ambulatory, no conversational dyspnea. HEAD: Normocephalic, atraumatic. EYES: Pupils equal, round, reactive to light.  No scleral icterus. MOUTH: Nose/mouth/throat not examined due to masking requirements for COVID 19. NECK: Supple. No thyromegaly. No nodules. No JVD.  Trachea is midline PULMONARY:  Symmetrical and good air entry, somewhat distant sounds, no wheezes or rhonchi noted. CARDIOVASCULAR: S1 and S2.    Regular rate and rhythm.  No rubs murmurs or gallops appreciated. GASTROINTESTINAL: Benign. MUSCULOSKELETAL: No joint deformity, no clubbing, no edema. NEUROLOGIC: No focal deficit, no gait disturbance, speech is fluent. SKIN: Intact,warm,dry. No rashes noted on limited exam. PSYCH: Mood and behavior normal.      Assessment & Plan:     ICD-10-CM  1. COPD mixed type ([place])  J44.9    Notes that Breo does not help Has tried many inhalers, no help Prefers as needed albuterol Refilled albuterol    2. Neuroendocrine carcinoma of lung ([place])  C7A.8    Appears to have increased size of tumor on left Repeat CT scan scheduled for February 2023 Follow-up post CT scan May need repeat cryoablation    3. Postinflammatory pulmonary fibrosis (HCC)  J84.10    Stable, mild changes on the right Post COVID-19 No sequela    4. Chronic vasomotor rhinitis  J30.0    Trial of Atrovent nasal 0.03%     Meds ordered this encounter  Medications   ipratropium (ATROVENT) 0.03 % nasal spray    Sig: Place 2 sprays into both nostrils 3 (three) times daily.    Dispense:  30 mL    Refill:  3   albuterol (VENTOLIN HFA) 108 (90 Base) MCG/ACT inhaler    Sig: Inhale 2 puffs into the lungs every 6 (six) hours as needed for wheezing or shortness of breath.    Dispense:  8 g    Refill:  1   Overall [place] is doing well.  We will follow closely with regards of potential need for cryoablation on the  left.  He understands that this is mostly palliative.  We will see him in follow-up in February after CT scan of the chest is performed, this is scheduled for 3 February. He is to contact us prior to follow-up should his dyspnea worsen in the interim.  Renold Don, MD Advanced Bronchoscopy PCCM  [facility]    *This note was dictated using voice recognition software/Dragon.  Despite best efforts to proofread, errors can occur which can change the meaning.  Any change was purely unintentional.

## 2021-04-10 ENCOUNTER — Encounter: Payer: Self-pay | Admitting: Pulmonary Disease

## 2021-04-13 ENCOUNTER — Inpatient Hospital Stay: Payer: Medicare Other

## 2021-04-13 ENCOUNTER — Other Ambulatory Visit: Payer: Self-pay

## 2021-04-13 ENCOUNTER — Inpatient Hospital Stay: Payer: Medicare Other | Attending: Oncology

## 2021-04-13 DIAGNOSIS — Z79899 Other long term (current) drug therapy: Secondary | ICD-10-CM | POA: Insufficient documentation

## 2021-04-13 DIAGNOSIS — C7A8 Other malignant neuroendocrine tumors: Secondary | ICD-10-CM

## 2021-04-13 DIAGNOSIS — E785 Hyperlipidemia, unspecified: Secondary | ICD-10-CM | POA: Diagnosis not present

## 2021-04-13 DIAGNOSIS — I4891 Unspecified atrial fibrillation: Secondary | ICD-10-CM | POA: Insufficient documentation

## 2021-04-13 DIAGNOSIS — Z7982 Long term (current) use of aspirin: Secondary | ICD-10-CM | POA: Diagnosis not present

## 2021-04-13 DIAGNOSIS — E119 Type 2 diabetes mellitus without complications: Secondary | ICD-10-CM | POA: Diagnosis not present

## 2021-04-13 DIAGNOSIS — N400 Enlarged prostate without lower urinary tract symptoms: Secondary | ICD-10-CM | POA: Insufficient documentation

## 2021-04-13 DIAGNOSIS — I2510 Atherosclerotic heart disease of native coronary artery without angina pectoris: Secondary | ICD-10-CM | POA: Insufficient documentation

## 2021-04-13 DIAGNOSIS — C787 Secondary malignant neoplasm of liver and intrahepatic bile duct: Secondary | ICD-10-CM | POA: Insufficient documentation

## 2021-04-13 DIAGNOSIS — I10 Essential (primary) hypertension: Secondary | ICD-10-CM | POA: Diagnosis not present

## 2021-04-13 DIAGNOSIS — J449 Chronic obstructive pulmonary disease, unspecified: Secondary | ICD-10-CM | POA: Insufficient documentation

## 2021-04-13 DIAGNOSIS — C7800 Secondary malignant neoplasm of unspecified lung: Secondary | ICD-10-CM | POA: Insufficient documentation

## 2021-04-13 LAB — COMPREHENSIVE METABOLIC PANEL
ALT: 22 U/L (ref 0–44)
AST: 23 U/L (ref 15–41)
Albumin: 4.3 g/dL (ref 3.5–5.0)
Alkaline Phosphatase: 108 U/L (ref 38–126)
Anion gap: 9 (ref 5–15)
BUN: 19 mg/dL (ref 8–23)
CO2: 28 mmol/L (ref 22–32)
Calcium: 9.6 mg/dL (ref 8.9–10.3)
Chloride: 102 mmol/L (ref 98–111)
Creatinine, Ser: 0.85 mg/dL (ref 0.61–1.24)
GFR, Estimated: >60 mL/min (ref >60)
Glucose, Bld: 109 mg/dL — ABNORMAL HIGH (ref 70–99)
Potassium: 4.8 mmol/L (ref 3.5–5.1)
Sodium: 139 mmol/L (ref 135–145)
Total Bilirubin: 0.8 mg/dL (ref 0.3–1.2)
Total Protein: 7.3 g/dL (ref 6.5–8.1)

## 2021-04-13 LAB — CBC WITH DIFFERENTIAL/PLATELET
Abs Immature Granulocytes: 0.05 10*3/uL (ref 0.00–0.07)
Basophils Absolute: 0.1 10*3/uL (ref 0.0–0.1)
Basophils Relative: 1 %
Eosinophils Absolute: 0.1 10*3/uL (ref 0.0–0.5)
Eosinophils Relative: 1 %
HCT: 48.1 % (ref 39.0–52.0)
Hemoglobin: 15.8 g/dL (ref 13.0–17.0)
Immature Granulocytes: 0 %
Lymphocytes Relative: 9 %
Lymphs Abs: 1 10*3/uL (ref 0.7–4.0)
MCH: 30.4 pg (ref 26.0–34.0)
MCHC: 32.8 g/dL (ref 30.0–36.0)
MCV: 92.7 fL (ref 80.0–100.0)
Monocytes Absolute: 1.6 10*3/uL — ABNORMAL HIGH (ref 0.1–1.0)
Monocytes Relative: 14 %
Neutro Abs: 8.7 10*3/uL — ABNORMAL HIGH (ref 1.7–7.7)
Neutrophils Relative %: 75 %
Platelets: 200 10*3/uL (ref 150–400)
RBC: 5.19 MIL/uL (ref 4.22–5.81)
RDW: 14.4 % (ref 11.5–15.5)
Smear Review: NORMAL
WBC: 11.5 10*3/uL — ABNORMAL HIGH (ref 4.0–10.5)
nRBC: 0 % (ref 0.0–0.2)

## 2021-04-13 MED ORDER — LANREOTIDE ACETATE 120 MG/0.5ML ~~LOC~~ SOLN
120.0000 mg | Freq: Once | SUBCUTANEOUS | Status: AC
  Administered 2021-04-13: 120 mg via SUBCUTANEOUS
  Filled 2021-04-13: qty 120

## 2021-04-14 LAB — CHROMOGRANIN A: Chromogranin A (ng/mL): 698 ng/mL — ABNORMAL HIGH (ref 0.0–101.8)

## 2021-05-13 ENCOUNTER — Inpatient Hospital Stay: Payer: Medicare Other

## 2021-05-13 ENCOUNTER — Other Ambulatory Visit: Payer: Self-pay

## 2021-05-13 ENCOUNTER — Inpatient Hospital Stay: Payer: Medicare Other | Attending: Oncology

## 2021-05-13 DIAGNOSIS — Z79899 Other long term (current) drug therapy: Secondary | ICD-10-CM | POA: Insufficient documentation

## 2021-05-13 DIAGNOSIS — C7A8 Other malignant neuroendocrine tumors: Secondary | ICD-10-CM | POA: Diagnosis present

## 2021-05-13 LAB — COMPREHENSIVE METABOLIC PANEL
ALT: 24 U/L (ref 0–44)
AST: 23 U/L (ref 15–41)
Albumin: 4.4 g/dL (ref 3.5–5.0)
Alkaline Phosphatase: 116 U/L (ref 38–126)
Anion gap: 5 (ref 5–15)
BUN: 19 mg/dL (ref 8–23)
CO2: 27 mmol/L (ref 22–32)
Calcium: 9.8 mg/dL (ref 8.9–10.3)
Chloride: 104 mmol/L (ref 98–111)
Creatinine, Ser: 1.01 mg/dL (ref 0.61–1.24)
GFR, Estimated: >60 mL/min (ref >60)
Glucose, Bld: 98 mg/dL (ref 70–99)
Potassium: 4.7 mmol/L (ref 3.5–5.1)
Sodium: 136 mmol/L (ref 135–145)
Total Bilirubin: 0.9 mg/dL (ref 0.3–1.2)
Total Protein: 7.5 g/dL (ref 6.5–8.1)

## 2021-05-13 LAB — CBC WITH DIFFERENTIAL/PLATELET
Abs Immature Granulocytes: 0.04 10*3/uL (ref 0.00–0.07)
Basophils Absolute: 0.1 10*3/uL (ref 0.0–0.1)
Basophils Relative: 1 %
Eosinophils Absolute: 0.1 10*3/uL (ref 0.0–0.5)
Eosinophils Relative: 1 %
HCT: 48.1 % (ref 39.0–52.0)
Hemoglobin: 15.7 g/dL (ref 13.0–17.0)
Immature Granulocytes: 0 %
Lymphocytes Relative: 10 %
Lymphs Abs: 1.1 10*3/uL (ref 0.7–4.0)
MCH: 30.4 pg (ref 26.0–34.0)
MCHC: 32.6 g/dL (ref 30.0–36.0)
MCV: 93 fL (ref 80.0–100.0)
Monocytes Absolute: 1.7 10*3/uL — ABNORMAL HIGH (ref 0.1–1.0)
Monocytes Relative: 16 %
Neutro Abs: 7.8 10*3/uL — ABNORMAL HIGH (ref 1.7–7.7)
Neutrophils Relative %: 72 %
Platelets: 186 10*3/uL (ref 150–400)
RBC: 5.17 MIL/uL (ref 4.22–5.81)
RDW: 14.7 % (ref 11.5–15.5)
WBC: 10.9 10*3/uL — ABNORMAL HIGH (ref 4.0–10.5)
nRBC: 0 % (ref 0.0–0.2)

## 2021-05-13 MED ORDER — LANREOTIDE ACETATE 120 MG/0.5ML ~~LOC~~ SOLN
120.0000 mg | Freq: Once | SUBCUTANEOUS | Status: AC
  Administered 2021-05-13: 120 mg via SUBCUTANEOUS
  Filled 2021-05-13: qty 120

## 2021-05-14 LAB — CHROMOGRANIN A: Chromogranin A (ng/mL): 723.8 ng/mL — ABNORMAL HIGH (ref 0.0–101.8)

## 2021-05-18 ENCOUNTER — Telehealth: Payer: Self-pay | Admitting: Pulmonary Disease

## 2021-05-18 NOTE — Telephone Encounter (Signed)
Spoke to patient.  Patient stated that he is planning to attend a funeral in [place].  He is wanting Dr. Domingo Dimes input on traveling.  He has had two covid vaccines and one booster and vaccinated against flu.  He plans to drive.   Dr. Patsey Berthold, please advise. Thanks

## 2021-05-18 NOTE — Telephone Encounter (Signed)
He should be safe driving.  Just pay attention to hand hygiene.  If on larger crowds of people I would still wear a mask.

## 2021-05-18 NOTE — Telephone Encounter (Signed)
Patient is aware of below recommendations and voiced his understanding.  Nothing further needed at this time.

## 2021-06-04 ENCOUNTER — Other Ambulatory Visit: Payer: Self-pay | Admitting: Interventional Radiology

## 2021-06-04 DIAGNOSIS — C7A8 Other malignant neuroendocrine tumors: Secondary | ICD-10-CM

## 2021-06-12 ENCOUNTER — Ambulatory Visit
Admission: RE | Admit: 2021-06-12 | Discharge: 2021-06-12 | Disposition: A | Discharge status: Home / Self Care | Payer: Medicare Other | Source: Ambulatory Visit | Attending: Oncology | Admitting: Oncology

## 2021-06-12 ENCOUNTER — Other Ambulatory Visit: Payer: Self-pay

## 2021-06-12 DIAGNOSIS — C7A8 Other malignant neuroendocrine tumors: Secondary | ICD-10-CM | POA: Diagnosis present

## 2021-06-12 MED ORDER — IOHEXOL 300 MG/ML  SOLN
100.0000 mL | Freq: Once | INTRAMUSCULAR | Status: AC | PRN
  Administered 2021-06-12: 100 mL via INTRAVENOUS

## 2021-06-15 NOTE — Progress Notes (Signed)
[place]  [phone] [phone]) [phone]  ID: Danne Harbor OB: 08-Mar-1941  MR#: [id number]  XYI#:[id number]  Patient Care Team: Towanda Malkin, MD as PCP - General (Internal Medicine) Minna Merritts, MD as PCP - Cardiology (Cardiology) [name], Forest Gleason, MD (General Surgery) Nestor Lewandowsky, MD (Inactive) as Referring Physician (Cardiothoracic Surgery) Minna Merritts, MD as Consulting Physician (Cardiology) [name], MD (Inactive) as Consulting Physician (Cardiothoracic Surgery) Lloyd Huger, MD as Consulting Physician (Oncology) Sanda Klein Satira Anis, MD as Attending Physician (Family Medicine)   CHIEF COMPLAINT: 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  INTERVAL HISTORY: Patient returns to clinic today for further evaluation, discussion of his imaging results, and continuation of lanreotide.  He currently feels well and is asymptomatic.  He denies any pain. He has no neurologic complaints.  He denies any recent fevers or illnesses.  He has a good appetite and denies weight loss.  He denies any chest pain, shortness of breath, cough, or hemoptysis.  He denies any abdominal pain.  He has no nausea, vomiting, constipation, or diarrhea.  He has no urinary complaints.  Patient offers no specific complaints today.  REVIEW OF SYSTEMS:   Review of Systems  Constitutional: Negative.  Negative for fever, malaise/fatigue and weight loss.  Respiratory: Negative.  Negative for cough, hemoptysis and shortness of breath.   Cardiovascular: Negative.  Negative for chest pain and leg swelling.  Gastrointestinal: Negative.  Negative for abdominal pain, diarrhea, nausea and vomiting.  Genitourinary: Negative.  Negative for dysuria.  Musculoskeletal: Negative.  Negative for back pain.  Skin: Negative.  Negative for rash.  Neurological: Negative.  Negative for dizziness, sensory change, focal weakness and weakness.   Psychiatric/Behavioral: Negative.  The patient is not nervous/anxious.    As per HPI. Otherwise, a complete review of systems is negative.  PAST MEDICAL HISTORY: Past Medical History:  Diagnosis Date   Allergic rhinitis    Benign prostatic hypertrophy with lower urinary tract symptoms (LUTS)    Chest pain    a. 05/2013 MV: EF 70%, no ischemia.   Diverticulitis    DIVERTICULOSIS   Enlarged prostate    Essential hypertension    CONTROLLED ON MEDS   Full dentures    upper and lower   H/O hypokalemia    Hx of atrial fibrillation without current medication    CARDIOLOGIST-DR GOLLAN/ [facility]   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 08/28/2016   Hyperlipemia    Hypomagnesemia    IFG (impaired fasting glucose)    Liver cancer (HCC)    Liver cancer ([place]) 12/08/2016   Liver mass, left lobe 08/28/2016   Probably hemangioma; will get MRI of liver, refer to GI   Lung cancer ([place])    a. carcinoid, left lung, Stage 1b (T2a, N0, cM0);  b. 05/2013 s/p VATS & LULobectomy.   Monocytosis    Osteopenia    Paroxysmal atrial flutter (HCC)    a. 03/2013->no recurrence;  b. CHA2DS2VASc = 2-->not currently on anticoagulation;  c. 05/2012 Echo: EF 55-60%, normal RV.   Wears glasses     PAST SURGICAL HISTORY: Past Surgical History:  Procedure Laterality Date   BRONCHOSCOPY     04/06/2013   CARDIOVASCULAR STRESS TEST  05/2013   a. No evidence of ischemia or infarct, EF 70%, no WMAs   CATARACT EXTRACTION W/PHACO Left 07/14/2015   Procedure: CATARACT EXTRACTION PHACO AND INTRAOCULAR LENS PLACEMENT (IOC);  Surgeon: [name], MD;  Location: [place];  Service: Ophthalmology;  Laterality: Left;   CATARACT EXTRACTION W/PHACO Right 10/01/2015   Procedure: CATARACT EXTRACTION PHACO AND INTRAOCULAR LENS PLACEMENT (IOC);  Surgeon: [name], MD;  Location: [place];  Service: Ophthalmology;  Laterality: Right;   COLONOSCOPY W/ POLYPECTOMY     bleed after-had  to go to surgery to stop bleeding via colonoscopy   COLONOSCOPY WITH PROPOFOL N/A 11/19/2015   Procedure: COLONOSCOPY WITH PROPOFOL;  Surgeon: [name], MD;  Location: [facility]COPY;  Service: Endoscopy;  Laterality: N/A;   DUPUYTREN CONTRACTURE RELEASE  12/15/2011   Procedure: DUPUYTREN CONTRACTURE RELEASE;  Surgeon: [name], MD;  Location: [place];  Service: Orthopedics;  Laterality: Left;  Fasciotomy left ring finger dupuytrens   ELECTROMAGNETIC NAVIGATION BROCHOSCOPY Left 09/19/2019   Procedure: ELECTROMAGNETIC NAVIGATION BRONCHOSCOPY;  Surgeon: Tyler Pita, MD;  Location: [facility];  Service: Cardiopulmonary;  Laterality: Left;   FASCIECTOMY Right 11/07/2018   Procedure: SEGMENTAL FASCIECTOMY RIGHT RING FINGER;  Surgeon: Daryll Brod, MD;  Location: [place];  Service: Orthopedics;  Laterality: Right;  AXILLARY BLOCK   FLEXIBLE BRONCHOSCOPY Left 11/26/2019   Procedure: FLEXIBLE BRONCHOSCOPY WITH CRYO THERAPY;  Surgeon: Tyler Pita, MD;  Location: [facility];  Service: Cardiopulmonary;  Laterality: Left;   IR ANGIOGRAM SELECTIVE EACH ADDITIONAL VESSEL  07/26/2017   IR ANGIOGRAM SELECTIVE EACH ADDITIONAL VESSEL  07/26/2017   IR ANGIOGRAM SELECTIVE EACH ADDITIONAL VESSEL  07/26/2017   IR ANGIOGRAM SELECTIVE EACH ADDITIONAL VESSEL  07/26/2017   IR ANGIOGRAM SELECTIVE EACH ADDITIONAL VESSEL  07/26/2017   IR ANGIOGRAM SELECTIVE EACH ADDITIONAL VESSEL  08/11/2017   IR ANGIOGRAM SELECTIVE EACH ADDITIONAL VESSEL  04/28/2018   IR ANGIOGRAM VISCERAL SELECTIVE  07/26/2017   IR ANGIOGRAM VISCERAL SELECTIVE  08/11/2017   IR ANGIOGRAM VISCERAL SELECTIVE  04/28/2018   IR EMBO ARTERIAL NOT HEMORR HEMANG INC GUIDE ROADMAPPING  07/26/2017   IR EMBO TUMOR ORGAN ISCHEMIA INFARCT INC GUIDE ROADMAPPING  08/11/2017   IR EMBO TUMOR ORGAN ISCHEMIA INFARCT INC GUIDE ROADMAPPING  04/28/2018   IR RADIOLOGIST EVAL & MGMT  07/07/2017   IR RADIOLOGIST EVAL & MGMT  09/07/2017   IR  RADIOLOGIST EVAL & MGMT  12/21/2017   IR RADIOLOGIST EVAL & MGMT  03/22/2018   IR RADIOLOGIST EVAL & MGMT  06/08/2018   IR RADIOLOGIST EVAL & MGMT  11/01/2018   IR RADIOLOGIST EVAL & MGMT  03/21/2019   IR RADIOLOGIST EVAL & MGMT  11/08/2019   IR RADIOLOGIST EVAL & MGMT  07/01/2020   IR RADIOLOGIST EVAL & MGMT  12/18/2020   IR US GUIDE VASC ACCESS RIGHT  07/26/2017   IR US GUIDE VASC ACCESS RIGHT  08/11/2017   IR US GUIDE VASC ACCESS RIGHT  04/28/2018   LUNG LOBECTOMY  06/10/13   upper left lung   MULTIPLE TOOTH EXTRACTIONS     REMOVAL RETAINED LENS Right 11/17/2015   Procedure: REMOVAL RETAINED LENS FROAGMENTS RIGHT EYE;  Surgeon: [name], MD;  Location: [place];  Service: Ophthalmology;  Laterality: Right;   TONSILLECTOMY     UPPER GASTROINTESTINAL ENDOSCOPY  11-03-15   Dr [name]   VASECTOMY     VIDEO ASSISTED THORACOSCOPY (VATS)/WEDGE RESECTION Left 06/04/2013   Procedure: VIDEO ASSISTED THORACOSCOPY (VATS)/WEDGE RESECTION;  Surgeon: [name], MD;  Location: [place];  Service: Thoracic;  Laterality: Left;   VIDEO BRONCHOSCOPY N/A 06/04/2013   Procedure: VIDEO BRONCHOSCOPY;  Surgeon: [name], MD;  [facility];  Service: Thoracic;  Laterality: N/A;    FAMILY HISTORY: Family History  Problem Relation Age of Onset   Stroke Mother    Alzheimer's disease Mother    Hypertension Sister    Hyperlipidemia Sister    Hypertension Brother    Hyperlipidemia Brother    Diabetes Brother     ADVANCED DIRECTIVES (Y/N):  N  HEALTH MAINTENANCE: Social History   Tobacco Use   Smoking status: Former    Packs/day: 1.00    Years: 40.00    Pack years: 40.00    Types: Cigarettes    Quit date: 12/09/1998    Years since quitting: 22.5   Smokeless tobacco: Never  Vaping Use   Vaping Use: Never used  Substance Use Topics   Alcohol use: No   Drug use: No     Colonoscopy:  PAP:  Bone density:  Lipid panel:  Allergies  Allergen Reactions   Tape Itching     Surgical tapes     Current Outpatient Medications  Medication Sig Dispense Refill   albuterol (VENTOLIN HFA) 108 (90 Base) MCG/ACT inhaler Inhale 2 puffs into the lungs every 6 (six) hours as needed for wheezing or shortness of breath. 8 g 1   aspirin EC 81 MG tablet Take 81 mg by mouth daily.      atorvastatin (LIPITOR) 40 MG tablet Take 1 tablet (40 mg total) by mouth daily. for cholesterol. 90 tablet 3   b complex vitamins capsule Take 1 capsule by mouth daily.     BREO ELLIPTA 100-25 MCG/INH AEPB Inhale 1 puff by mouth once daily 60 each 0   cholecalciferol (VITAMIN D3) 25 MCG (1000 UNIT) tablet Take 1,000 Units by mouth daily.     Cinnamon 500 MG capsule Take 500 mg by mouth at bedtime.      diphenhydrAMINE (BENADRYL) 25 mg capsule Take 25 mg by mouth every morning.      DM-APAP-CPM (CORICIDIN HBP PO) Take 2 tablets by mouth daily as needed (allergies/cold symptoms).     fluticasone furoate-vilanterol (BREO ELLIPTA) 100-25 MCG/ACT AEPB Inhale 1 puff into the lungs daily. 28 each 5   glucose blood (ONETOUCH VERIO) test strip USE 1 STRIP TO CHECK GLUCOSE ONCE DAILY 100 each 3   ipratropium (ATROVENT) 0.03 % nasal spray Place 2 sprays into both nostrils 3 (three) times daily. 30 mL 3   Multiple Vitamins-Minerals (MULTIVITAMIN WITH MINERALS) tablet Take 1 tablet by mouth daily.      polycarbophil (FIBERCON) 625 MG tablet Take 625 mg by mouth 2 (two) times daily.      vitamin C (ASCORBIC ACID) 500 MG tablet Take 500 mg by mouth daily.      zinc sulfate 220 (50 Zn) MG capsule Take 1 capsule (220 mg total) by mouth daily. 30 capsule 0   losartan (COZAAR) 100 MG tablet Take 1 tablet (100 mg total) by mouth daily. 90 tablet 3   No current facility-administered medications for this visit.   Facility-Administered Medications Ordered in Other Visits  Medication Dose Route Frequency Provider Last Rate Last Admin   lanreotide acetate (SOMATULINE DEPOT) injection 120 mg  120 mg Subcutaneous  Once Lloyd Huger, MD        OBJECTIVE: Vitals:   06/16/21 1000  BP: (!) 152/72  Pulse: 63  Resp: 18  Temp: (!) 97 F (36.1 C)  SpO2: 97%     Body mass index is 27.69 kg/m.    ECOG FS:0 - Asymptomatic  General:  Well-developed, well-nourished, no acute distress. Eyes: Pink conjunctiva, anicteric sclera. HEENT: Normocephalic, moist mucous membranes. Lungs: No audible wheezing or coughing. Heart: Regular rate and rhythm. Abdomen: Soft, nontender, no obvious distention. Musculoskeletal: No edema, cyanosis, or clubbing. Neuro: Alert, answering all questions appropriately. Cranial nerves grossly intact. Skin: No rashes or petechiae noted. Psych: Normal affect.  LAB RESULTS:  Lab Results  Component Value Date   NA 137 06/16/2021   K 4.3 06/16/2021   CL 102 06/16/2021   CO2 26 06/16/2021   GLUCOSE 95 06/16/2021   BUN 22 06/16/2021   CREATININE 1.07 06/16/2021   CALCIUM 9.6 06/16/2021   PROT 7.0 06/16/2021   ALBUMIN 4.2 06/16/2021   AST 22 06/16/2021   ALT 22 06/16/2021   ALKPHOS 102 06/16/2021   BILITOT 0.4 06/16/2021   GFRNONAA >60 06/16/2021   GFRAA >60 01/29/2020    Lab Results  Component Value Date   WBC 9.8 06/16/2021   NEUTROABS 6.9 06/16/2021   HGB 15.8 06/16/2021   HCT 47.9 06/16/2021   MCV 92.3 06/16/2021   PLT 169 06/16/2021     STUDIES: CT CHEST ABDOMEN PELVIS W CONTRAST  Result Date: 06/14/2021 CLINICAL DATA:  Metastatic neuroendocrine tumor of the lung with liver metastases, history of left upper lobectomy and Y90 radioembolization EXAM: CT CHEST, ABDOMEN, AND PELVIS WITH CONTRAST TECHNIQUE: Multidetector CT imaging of the chest, abdomen and pelvis was performed following the standard protocol during bolus administration of intravenous contrast. RADIATION DOSE REDUCTION: This exam was performed according to the departmental dose-optimization program which includes automated exposure control, adjustment of the mA and/or kV according to patient  size and/or use of iterative reconstruction technique. CONTRAST:  13m OMNIPAQUE IOHEXOL 300 MG/ML SOLN, additional oral enteric contrast COMPARISON:  12/12/2020 FINDINGS: CT CHEST FINDINGS Cardiovascular: Aortic atherosclerosis. Normal heart size. Left and right coronary artery calcifications. No pericardial effusion. Mediastinum/Nodes: No enlarged mediastinal, hilar, or axillary lymph nodes. Thyroid gland, trachea, and esophagus demonstrate no significant findings. Lungs/Pleura: Interval enlargement of nodular soft tissue of the anterior aspect of the left mainstem bronchus, this nodule now measuring approximately 1.0 x 0.7 cm, previously no greater than 0.6 x 0.5 cm (series 2, image 26). Status post left upper lobectomy. Moderate centrilobular and paraseptal emphysema. Irregular peripheral interstitial opacity, subpleural thickening, and areas of subpleural bronchiolectasis, most conspicuous in the right lung, unchanged. Additional irregular peripheral interstitial opacity with arcing, bandlike elements and subpleural sparing, unchanged. No pleural effusion or pneumothorax. Musculoskeletal: No chest wall mass. CT ABDOMEN PELVIS FINDINGS Hepatobiliary: Unchanged post treatment appearance of the liver, with capsular retraction about an isoenhancing mass of the anterior liver dome, hepatic segment VIII, measuring 3.0 x 2.2 cm (series 2, image 43). Similar post treatment appearance with capsular retraction about an isoenhancing mass of the anterior left lobe of the liver, hepatic segment III, measuring 1.9 x 1.9 cm (series 2, image 53). Contracted gallbladder. No gallbladder wall thickening, or biliary dilatation. Pancreas: Unremarkable. No pancreatic ductal dilatation or surrounding inflammatory changes. Spleen: Normal in size without significant abnormality. Adrenals/Urinary Tract: Adrenal glands are unremarkable. Kidneys are normal, without renal calculi, solid lesion, or hydronephrosis. 1.2 cm rim calcified  calculus in the dependent urinary bladder (series 2, image 108). Stomach/Bowel: Stomach is within normal limits. Appendix is surgically absent. No evidence of bowel wall thickening, distention, or inflammatory changes. Descending and sigmoid diverticulosis. Vascular/Lymphatic: Aortic atherosclerosis. Coil material in the gastroduodenal artery. No enlarged abdominal or pelvic lymph nodes. Reproductive: No mass or other abnormality. Other: No abdominal wall  hernia or abnormality. No ascites. Musculoskeletal: No acute osseous findings. Multiple unchanged sclerotic osseous metastatic lesions, including of the inferior sternal body (series 6, image 87), the T1 vertebral body (series 3, image 14), T11 vertebral body (series 6, image 92), and the L2 and L3 vertebral bodies (series 6, image 87). Multiple soft tissue attenuation nodules in the subcutaneous fat of the buttocks, likely injection granulomata and unchanged (series 2, image 108). IMPRESSION: 1. Interval enlargement of nodular soft tissue of the anterior aspect of the left mainstem bronchus, this nodule now measuring approximately 1.0 x 0.7 cm, previously no greater than 0.6 x 0.5 cm. This finding is concerning for recurrent/metastatic disease. 2. Status post left upper lobectomy. 3. Unchanged post treatment appearance of the liver, with capsular retraction about masses of the anterior liver dome, hepatic segment VIII, and anterior left lobe of the liver, hepatic segment III. 4. Multiple unchanged sclerotic osseous metastatic lesions, including of the inferior sternal body, as well as the T1, T11, L2, and L3 vertebral bodies. 5. Bladder calculus measuring 1.2 cm. 6. Emphysema. 7. Unchanged evidence of fibrotic interstitial lung disease, in a "probable UIP" pattern, as well as possible superimposed sequelae of COVID pneumonia. 8. Coronary artery disease. Aortic Atherosclerosis (ICD10-I70.0) and Emphysema (ICD10-J43.9). Electronically Signed   By: Delanna Ahmadi M.D.    On: 06/14/2021 20:59    ASSESSMENT: 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  PLAN:    1. 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 Patient underwent left upper lobe lobectomy in [place] on June 04, 2013. Final pathology results revealed a well differentiated neuroendocrine tumor with positive bronchial margins. Patient did not receive adjuvant XRT or chemotherapy at that time.  His most recent Y 90 ablation to his liver occurred on April 28, 2018.  Patient's chromogranin A levels have trended up and are now 723.8.  Previously, they ranged between 213 and 261.  CT scan results from June 14, 2021 reviewed independently and reported as above with mild enlargement of patient's known lesion in his lung.  Case discussed with pulmonology and plan is to do repeat cryoablation in the near future.  He has an appointment with Dr. Patsey Berthold tomorrow.  Proceed with 120 mg subcutaneous lanreotide today.  Return to clinic in 4 and 8 weeks with laboratory work and treatment only.  Patient will then return to clinic in 12 weeks for further evaluation and continuation of treatment.  Repeat imaging in 6 months.   2.  Left mainstem endobronchial mass: CT scan results as above.  Consistent with neuroendocrine tumor.  Patient's most recent therapeutic cryoablation ablation occurred on November 26, 2019.  Follow-up tomorrow with pulmonology and cryoablation in the near future as mentioned.   3.  Covid: Resolved.  4.  Urinary retention: Resolved. Foley has been removed.    I spent a total of 30 minutes reviewing chart data, face-to-face evaluation with the patient, counseling and coordination of care as detailed above.   Patient expressed understanding and was in agreement with this plan. He also understands that He can call clinic at any time with any questions, concerns, or  complaints.    Cancer Staging  Neuroendocrine carcinoma of lung [facility]) Staging form: Lung, AJCC 8th Edition - Clinical stage from 12/28/2016: Stage IVA (cT2a, cN0, cM1b) - Signed by Lloyd Huger, MD on 12/28/2016 Sites of metastasis: Liver   Lloyd Huger, MD  06/16/2021 12:34 PM

## 2021-06-16 ENCOUNTER — Inpatient Hospital Stay: Payer: Medicare Other | Attending: Oncology

## 2021-06-16 ENCOUNTER — Inpatient Hospital Stay: Payer: Medicare Other

## 2021-06-16 ENCOUNTER — Other Ambulatory Visit: Payer: Self-pay

## 2021-06-16 ENCOUNTER — Inpatient Hospital Stay (HOSPITAL_BASED_OUTPATIENT_CLINIC_OR_DEPARTMENT_OTHER): Payer: Medicare Other | Admitting: Oncology

## 2021-06-16 VITALS — BP 152/72 | HR 63 | Temp 97.0°F | Resp 18 | Wt 161.3 lb

## 2021-06-16 DIAGNOSIS — C7951 Secondary malignant neoplasm of bone: Secondary | ICD-10-CM | POA: Diagnosis not present

## 2021-06-16 DIAGNOSIS — C7A8 Other malignant neuroendocrine tumors: Secondary | ICD-10-CM

## 2021-06-16 DIAGNOSIS — Z79899 Other long term (current) drug therapy: Secondary | ICD-10-CM | POA: Insufficient documentation

## 2021-06-16 DIAGNOSIS — C787 Secondary malignant neoplasm of liver and intrahepatic bile duct: Secondary | ICD-10-CM | POA: Diagnosis not present

## 2021-06-16 DIAGNOSIS — C7800 Secondary malignant neoplasm of unspecified lung: Secondary | ICD-10-CM | POA: Insufficient documentation

## 2021-06-16 LAB — COMPREHENSIVE METABOLIC PANEL
ALT: 22 U/L (ref 0–44)
AST: 22 U/L (ref 15–41)
Albumin: 4.2 g/dL (ref 3.5–5.0)
Alkaline Phosphatase: 102 U/L (ref 38–126)
Anion gap: 9 (ref 5–15)
BUN: 22 mg/dL (ref 8–23)
CO2: 26 mmol/L (ref 22–32)
Calcium: 9.6 mg/dL (ref 8.9–10.3)
Chloride: 102 mmol/L (ref 98–111)
Creatinine, Ser: 1.07 mg/dL (ref 0.61–1.24)
GFR, Estimated: >60 mL/min (ref >60)
Glucose, Bld: 95 mg/dL (ref 70–99)
Potassium: 4.3 mmol/L (ref 3.5–5.1)
Sodium: 137 mmol/L (ref 135–145)
Total Bilirubin: 0.4 mg/dL (ref 0.3–1.2)
Total Protein: 7 g/dL (ref 6.5–8.1)

## 2021-06-16 LAB — CBC WITH DIFFERENTIAL/PLATELET
Abs Immature Granulocytes: 0.04 10*3/uL (ref 0.00–0.07)
Basophils Absolute: 0.1 10*3/uL (ref 0.0–0.1)
Basophils Relative: 1 %
Eosinophils Absolute: 0.1 10*3/uL (ref 0.0–0.5)
Eosinophils Relative: 1 %
HCT: 47.9 % (ref 39.0–52.0)
Hemoglobin: 15.8 g/dL (ref 13.0–17.0)
Immature Granulocytes: 0 %
Lymphocytes Relative: 10 %
Lymphs Abs: 1 10*3/uL (ref 0.7–4.0)
MCH: 30.4 pg (ref 26.0–34.0)
MCHC: 33 g/dL (ref 30.0–36.0)
MCV: 92.3 fL (ref 80.0–100.0)
Monocytes Absolute: 1.7 10*3/uL — ABNORMAL HIGH (ref 0.1–1.0)
Monocytes Relative: 17 %
Neutro Abs: 6.9 10*3/uL (ref 1.7–7.7)
Neutrophils Relative %: 71 %
Platelets: 169 10*3/uL (ref 150–400)
RBC: 5.19 MIL/uL (ref 4.22–5.81)
RDW: 14 % (ref 11.5–15.5)
WBC: 9.8 10*3/uL (ref 4.0–10.5)
nRBC: 0 % (ref 0.0–0.2)

## 2021-06-16 MED ORDER — LANREOTIDE ACETATE 120 MG/0.5ML ~~LOC~~ SOLN
120.0000 mg | Freq: Once | SUBCUTANEOUS | Status: AC
Start: 2021-06-16 — End: 2021-06-16
  Administered 2021-06-16: 120 mg via SUBCUTANEOUS
  Filled 2021-06-16: qty 120

## 2021-06-17 ENCOUNTER — Encounter: Payer: Self-pay | Admitting: Pulmonary Disease

## 2021-06-17 ENCOUNTER — Telehealth: Payer: Self-pay | Admitting: Pulmonary Disease

## 2021-06-17 ENCOUNTER — Ambulatory Visit: Payer: Medicare Other | Admitting: Pulmonary Disease

## 2021-06-17 VITALS — BP 152/68 | HR 53 | Temp 97.9°F | Ht 64.0 in | Wt 161.2 lb

## 2021-06-17 DIAGNOSIS — C7A8 Other malignant neuroendocrine tumors: Secondary | ICD-10-CM | POA: Diagnosis not present

## 2021-06-17 DIAGNOSIS — J8410 Pulmonary fibrosis, unspecified: Secondary | ICD-10-CM

## 2021-06-17 DIAGNOSIS — J449 Chronic obstructive pulmonary disease, unspecified: Secondary | ICD-10-CM | POA: Diagnosis not present

## 2021-06-17 NOTE — Patient Instructions (Addendum)
We are scheduling your procedure for 20 February.   We will see you in follow-up in 4 to 6 weeks time call sooner should any new problems arise.  You may stop using your inhaler.

## 2021-06-17 NOTE — Telephone Encounter (Signed)
Noted  

## 2021-06-17 NOTE — Telephone Encounter (Signed)
Phone pre admit 06/23/2021 between 8-1 and covid test 06/26/2021 between 8-12.  Patient is aware of dates/times and voiced his understanding.

## 2021-06-17 NOTE — Progress Notes (Signed)
Subjective:    Patient ID: Jason Malone, male    DOB: 1940-11-29, 81 y.o.   MRN: [id number] Chief Complaint  Patient presents with   Follow-up    Using Breo 1-2x weekly--non prod cough.    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  His last visit here was on 09 April 2021.  The patient underwent resection of his carcinoid in 2015 however , had recurrence noted in August 2018 when liver involvement was noted.  In January 2021 he had a severe case of COVID-19 and was admitted to the [facility].  CT scan performed on 11 June 2019 showed typical parenchymal involvement of COVID-19 on the right lung.  The left lung had a lobulated mass in the left mainstem bronchus that appeared to be arising from the suture line from prior resection.  He has undergone cryoablation to this area on 3 separate occasions namely 19 Sep 2019, 10 October 2019 and finally on 26 November 2019.  On these occasions the mass was debulked and significantly reduced in size.  Minimal residual was noted at the last session.  Biopsies were consistent with neuroendocrine carcinoma (carcinoid).  He had a CT chest abdomen and pelvis performed on 12 December 2020 that showed that he may have recurrence at this suture line.  However at the time it was not obstructing the bronchus and the patient had not noticed any significant shortness of breath over his baseline.  He underwent chest abdomen and pelvis CT with contrast on 3 February and the nodule or soft tissue of the anterior aspect of the left mainstem bronchus has continued to increase and is now 1 cm x 0.7 mm.  I discussed with the patient that he is likely going to need  Cough only occurs when he uses [facility] and he has had to limit the use of this.  Not note that the [facility] helps him.  He does note that occasional use of albuterol  does help him.  He has not had any fevers, chills or sweats.  Neck CT is scheduled for February 2023.  He has had some issues with vasomotor rhinitis but otherwise he voices no other complaint today.  Overall he looks well and feels well.     Review of Systems A 10 point review of systems was performed and it is as noted above otherwise negative.  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   Respiratory failure ([place]) 06/11/2019   Elevated LFTs 06/11/2019   Pneumonia due to COVID-19 virus 06/10/2019   Contracture of palmar fascia 10/11/2018   Microalbuminuria due to type 2 diabetes mellitus ([place]) 04/12/2018   Type 2 diabetes mellitus ([place]) 04/10/2018   Osteopenia determined by x-ray 01/07/2017   Neuroendocrine carcinoma of lung ([place]) 12/28/2016   Low back pain 12/17/2016   Liver mass, left lobe 08/28/2016   Aortic  atherosclerosis ([place]) 04/06/2016   Coronary artery disease involving native heart without angina pectoris 04/06/2016   History of colonic polyps 10/28/2015   Medication monitoring encounter 08/15/2015   Paroxysmal atrial flutter (HCC)    Elevated serum alkaline phosphatase level 03/05/2015   Allergic rhinitis    BPH without urinary obstruction    Hyperlipidemia 02/13/2014   Orthostasis 07/18/2013   Sinus bradycardia 07/18/2013   S/P lobectomy of lung 06/04/2013   Atrial flutter ([place]) 04/09/2013   Smoking history 04/09/2013   Essential hypertension 04/09/2013   Rectal bleeding  12/15/2012   Social History   Tobacco Use   Smoking status: Former    Packs/day: 1.00    Years: 40.00    Pack years: 40.00    Types: Cigarettes    Quit date: 12/09/1998    Years since quitting: 22.5   Smokeless tobacco: Never  Substance Use Topics   Alcohol use: No   Allergies  Allergen Reactions   Tape Itching    Surgical tapes    Current Meds  Medication Sig   aspirin EC 81 MG tablet Take 81 mg by mouth daily.    atorvastatin (LIPITOR) 40 MG tablet Take 1 tablet (40 mg total) by mouth daily. for cholesterol.   b complex vitamins capsule Take 1 capsule by mouth daily.   cholecalciferol (VITAMIN D3) 25 MCG (1000 UNIT) tablet Take 1,000 Units by mouth daily.   Cinnamon 500 MG capsule Take 500 mg by mouth at bedtime.    diphenhydrAMINE (BENADRYL) 25 mg capsule Take 25 mg by mouth every morning.    DM-APAP-CPM (CORICIDIN HBP PO) Take 2 tablets by mouth daily as needed (allergies/cold symptoms).   fluticasone furoate-vilanterol (BREO ELLIPTA) 100-25 MCG/ACT AEPB Inhale 1 puff into the lungs daily.   glucose blood (ONETOUCH VERIO) test strip USE 1 STRIP TO CHECK GLUCOSE ONCE DAILY   ipratropium (ATROVENT) 0.03 % nasal spray Place 2 sprays into both nostrils 3 (three) times daily.   Multiple Vitamins-Minerals (MULTIVITAMIN WITH MINERALS) tablet Take 1 tablet by mouth daily.    polycarbophil (FIBERCON) 625 MG tablet Take 625 mg by mouth 2 (two) times daily.    vitamin C (ASCORBIC ACID) 500 MG tablet Take 500 mg by mouth daily.    zinc sulfate 220 (50 Zn) MG capsule Take 1 capsule (220 mg total) by mouth daily.   [DISCONTINUED] BREO ELLIPTA 100-25 MCG/INH AEPB Inhale 1 puff by mouth once daily   Immunization History  Administered Date(s) Administered   Fluad Quad(high Dose 65+) 01/24/2019, 01/23/2020, 01/30/2021   Influenza, High Dose Seasonal PF 02/12/2016, 02/14/2017, 01/17/2018   Influenza,inj,Quad PF,6+ Mos 02/14/2015   PFIZER(Purple Top)SARS-COV-2 Vaccination 05/24/2019,  10/02/2019, 04/14/2020   Pneumococcal Conjugate-13 02/01/2014   Pneumococcal Polysaccharide-23 05/10/2008       Objective:   Physical Exam BP (!) 152/68 (BP Location: Left Arm, Cuff Size: Normal)    Pulse (!) 53    Temp 97.9 F (36.6 C) (Temporal)    Ht 5\' 4"  (1.626 m)    Wt 161 lb 3.2 oz (73.1 kg)    SpO2 97%    BMI 27.67 kg/m  GENERAL: Awake and alert, well developed, well nourished gentleman, in no respiratory distress, fully ambulatory, no conversational dyspnea. HEAD: Normocephalic, atraumatic. EYES: Pupils equal, round, reactive to light.  No scleral icterus. MOUTH: Nose/mouth/throat not examined due to masking requirements for COVID 19. NECK: Supple. No thyromegaly. No nodules. No JVD.  Trachea is midline PULMONARY:  Symmetrical and good air entry, somewhat distant sounds,  no wheezes or rhonchi noted. CARDIOVASCULAR: S1 and S2.    Regular rate and rhythm.  No rubs murmurs or gallops appreciated. GASTROINTESTINAL: Benign. MUSCULOSKELETAL: No joint deformity, no clubbing, no edema. NEUROLOGIC: No focal deficit, no gait disturbance, speech is fluent. SKIN: Intact,warm,dry. No rashes noted on limited exam. PSYCH: Mood and behavior normal.   Representative image from the CT on 12 June 2021 showing recurrent tumor at suture line on the left mainstem bronchus:        Assessment & Plan:     ICD-10-CM   1. Neuroendocrine carcinoma of lung (HCC)  C7A.8    Recurrence on the left mainstem bronchus Impending obstruction if not treated Proceed with spray cryotherapy and tumor ablation GA for procedure    2. Postinflammatory pulmonary fibrosis (HCC)  J84.10    Residual on right lung Minimal changes    3. COPD mixed type ([place])  J44.9    Does not tolerate Breo Asymptomatic As needed albuterol only Discontinue Breo     Benefits, limitations and potential complications of the procedure (spray cryotherapy with tumor destruction) were discussed with the patient/family.   Complications from bronchoscopy are rare and most often minor, but if they occur they may include breathing difficulty, vocal cord spasm, hoarseness, slight fever, vomiting, dizziness, bronchospasm, infection, low blood oxygen, bleeding from biopsy site, or an allergic reaction to medications.  It is uncommon for patients to experience other more serious complications for example: Collapsed lung requiring chest tube placement, respiratory failure, heart attack and/or cardiac arrhythmia.   Patient will undergo bronchoscopic spray cryotherapy directed to the recurrent lesion on the left mainstem bronchus, tumor excision/ablation will then be done.  The procedure will be done under general anesthesia.  The patient understands the potential benefits, limitations and complications of the procedure, he agrees to go ahead.  Procedure has been scheduled for 20 February at 12:30 PM.  We will see the patient in follow-up in 4 to 6 weeks time he is to contact us sooner should any new difficulties arise.   Renold Don, MD Advanced Bronchoscopy P[facility]    *This note was dictated using voice recognition software/Dragon.  Despite best efforts to proofread, errors can occur which can change the meaning. Any transcriptional errors that result from this process are unintentional and may not be fully corrected at the time of dictation.

## 2021-06-17 NOTE — Telephone Encounter (Signed)
Regular bronchoscopy with cryo therapy and tumor debulking 06/29/2021 at 12:30. [id number], [place], [id number] NB:ZXYD mass  Rodena Piety, please see bronch info. Thanks

## 2021-06-17 NOTE — Telephone Encounter (Signed)
For the codes 31628, [phone], [phone] Prior Auth Not Required Refer # [id number]

## 2021-06-18 ENCOUNTER — Other Ambulatory Visit: Payer: Self-pay

## 2021-06-18 ENCOUNTER — Encounter: Payer: Self-pay | Admitting: Internal Medicine

## 2021-06-18 ENCOUNTER — Ambulatory Visit
Admission: RE | Admit: 2021-06-18 | Discharge: 2021-06-18 | Disposition: A | Discharge status: Home / Self Care | Payer: Medicare Other | Source: Ambulatory Visit | Attending: Interventional Radiology | Admitting: Interventional Radiology

## 2021-06-18 ENCOUNTER — Ambulatory Visit (INDEPENDENT_AMBULATORY_CARE_PROVIDER_SITE_OTHER): Payer: Medicare Other | Admitting: Internal Medicine

## 2021-06-18 VITALS — BP 136/78 | HR 97 | Temp 98.0°F | Resp 16 | Ht 64.0 in | Wt 161.7 lb

## 2021-06-18 DIAGNOSIS — C7B8 Other secondary neuroendocrine tumors: Secondary | ICD-10-CM

## 2021-06-18 DIAGNOSIS — I10 Essential (primary) hypertension: Secondary | ICD-10-CM

## 2021-06-18 DIAGNOSIS — C7A8 Other malignant neuroendocrine tumors: Secondary | ICD-10-CM

## 2021-06-18 DIAGNOSIS — Z1159 Encounter for screening for other viral diseases: Secondary | ICD-10-CM | POA: Diagnosis not present

## 2021-06-18 DIAGNOSIS — E782 Mixed hyperlipidemia: Secondary | ICD-10-CM

## 2021-06-18 DIAGNOSIS — E119 Type 2 diabetes mellitus without complications: Secondary | ICD-10-CM | POA: Diagnosis not present

## 2021-06-18 HISTORY — PX: IR RADIOLOGIST EVAL & MGMT: IMG5224

## 2021-06-18 LAB — CHROMOGRANIN A: Chromogranin A (ng/mL): 754.5 ng/mL — ABNORMAL HIGH (ref 0.0–101.8)

## 2021-06-18 MED ORDER — AMLODIPINE BESYLATE 2.5 MG PO TABS: 2.5000 mg | ORAL_TABLET | Freq: Every day | ORAL | 1 refills | Status: DC

## 2021-06-18 NOTE — Progress Notes (Signed)
Patient ID: Jason Malone, male   DOB: [birth year], 81 y.o.   MRN: [id number]       Chief Complaint:  3 years status post Y90 radioembolization for metastatic neuroendocrine tumor to the liver  Referring Physician(s): Phenergan  History of Present Illness: Jason Malone is a 81 y.o. male with stage IV metastatic neuroendocrine tumor to the liver.  He is now 3 years status post whole liver Y90 radioembolization.  Most recent CT demonstrates continued stable hepatic changes.  No enlarging or new liver abnormality.  No biliary dilatation or obstruction.  No new extrahepatic disease.  Negative for ascites.  Overall he continues to do very well.  No new abdominal complaints no recent illness or fever.  Excellent functional status.  He remains very active.  He continues on monthly Sandostatin injections.  Of note, most recent surveillance imaging also demonstrated a left mainstem bronchus recurrent nodule.  He is already seen Dr. Patsey Berthold with pulmonary and is scheduled for bronchoscopy with ablation later this month.  Past Medical History:  Diagnosis Date   Allergic rhinitis    Benign prostatic hypertrophy with lower urinary tract symptoms (LUTS)    Chest pain    a. 05/2013 MV: EF 70%, no ischemia.   Diverticulitis    DIVERTICULOSIS   Enlarged prostate    Essential hypertension    CONTROLLED ON MEDS   Full dentures    upper and lower   H/O hypokalemia    Hx of atrial fibrillation without current medication    CARDIOLOGIST-DR GOLLAN/ [facility]   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 08/28/2016   Hyperlipemia    Hypomagnesemia    IFG (impaired fasting glucose)    Liver cancer (HCC)    Liver cancer ([place]) 12/08/2016   Liver mass, left lobe 08/28/2016   Probably hemangioma; will get MRI of liver, refer to GI   Lung cancer ([place])    a. carcinoid, left lung, Stage 1b (T2a, N0, cM0);  b. 05/2013 s/p VATS & LULobectomy.   Monocytosis    Osteopenia    Paroxysmal atrial flutter (HCC)     a. 03/2013->no recurrence;  b. CHA2DS2VASc = 2-->not currently on anticoagulation;  c. 05/2012 Echo: EF 55-60%, normal RV.   Wears glasses     Past Surgical History:  Procedure Laterality Date   BRONCHOSCOPY     04/06/2013   CARDIOVASCULAR STRESS TEST  05/2013   a. No evidence of ischemia or infarct, EF 70%, no WMAs   CATARACT EXTRACTION W/PHACO Left 07/14/2015   Procedure: CATARACT EXTRACTION PHACO AND INTRAOCULAR LENS PLACEMENT (IOC);  Surgeon: [name], MD;  Location: [place];  Service: Ophthalmology;  Laterality: Left;   CATARACT EXTRACTION W/PHACO Right 10/01/2015   Procedure: CATARACT EXTRACTION PHACO AND INTRAOCULAR LENS PLACEMENT (IOC);  Surgeon: [name], MD;  Location: [place];  Service: Ophthalmology;  Laterality: Right;   COLONOSCOPY W/ POLYPECTOMY     bleed after-had to go to surgery to stop bleeding via colonoscopy   COLONOSCOPY WITH PROPOFOL N/A 11/19/2015   Procedure: COLONOSCOPY WITH PROPOFOL;  Surgeon: [name], MD;  Location: [facility] ENDOSCOPY;  Service: Endoscopy;  Laterality: N/A;   DUPUYTREN CONTRACTURE RELEASE  12/15/2011   Procedure: DUPUYTREN CONTRACTURE RELEASE;  Surgeon: [name], MD;  Location: [place];  Service: Orthopedics;  Laterality: Left;  Fasciotomy left ring finger dupuytrens   ELECTROMAGNETIC NAVIGATION BROCHOSCOPY Left 09/19/2019   Procedure: ELECTROMAGNETIC NAVIGATION BRONCHOSCOPY;  Surgeon: Tyler Pita, MD;  Location: [facility];  Service: Cardiopulmonary;  Laterality: Left;   FASCIECTOMY Right 11/07/2018   Procedure: SEGMENTAL FASCIECTOMY RIGHT RING FINGER;  Surgeon: Daryll Brod, MD;  Location: [place];  Service: Orthopedics;  Laterality: Right;  AXILLARY BLOCK   FLEXIBLE BRONCHOSCOPY Left 11/26/2019   Procedure: FLEXIBLE BRONCHOSCOPY WITH CRYO THERAPY;  Surgeon: Tyler Pita, MD;  Location: [facility];  Service: Cardiopulmonary;  Laterality: Left;   IR  ANGIOGRAM SELECTIVE EACH ADDITIONAL VESSEL  07/26/2017   IR ANGIOGRAM SELECTIVE EACH ADDITIONAL VESSEL  07/26/2017   IR ANGIOGRAM SELECTIVE EACH ADDITIONAL VESSEL  07/26/2017   IR ANGIOGRAM SELECTIVE EACH ADDITIONAL VESSEL  07/26/2017   IR ANGIOGRAM SELECTIVE EACH ADDITIONAL VESSEL  07/26/2017   IR ANGIOGRAM SELECTIVE EACH ADDITIONAL VESSEL  08/11/2017   IR ANGIOGRAM SELECTIVE EACH ADDITIONAL VESSEL  04/28/2018   IR ANGIOGRAM VISCERAL SELECTIVE  07/26/2017   IR ANGIOGRAM VISCERAL SELECTIVE  08/11/2017   IR ANGIOGRAM VISCERAL SELECTIVE  04/28/2018   IR EMBO ARTERIAL NOT HEMORR HEMANG INC GUIDE ROADMAPPING  07/26/2017   IR EMBO TUMOR ORGAN ISCHEMIA INFARCT INC GUIDE ROADMAPPING  08/11/2017   IR EMBO TUMOR ORGAN ISCHEMIA INFARCT INC GUIDE ROADMAPPING  04/28/2018   IR RADIOLOGIST EVAL & MGMT  07/07/2017   IR RADIOLOGIST EVAL & MGMT  09/07/2017   IR RADIOLOGIST EVAL & MGMT  12/21/2017   IR RADIOLOGIST EVAL & MGMT  03/22/2018   IR RADIOLOGIST EVAL & MGMT  06/08/2018   IR RADIOLOGIST EVAL & MGMT  11/01/2018   IR RADIOLOGIST EVAL & MGMT  03/21/2019   IR RADIOLOGIST EVAL & MGMT  11/08/2019   IR RADIOLOGIST EVAL & MGMT  07/01/2020   IR RADIOLOGIST EVAL & MGMT  12/18/2020   IR US GUIDE VASC ACCESS RIGHT  07/26/2017   IR US GUIDE VASC ACCESS RIGHT  08/11/2017   IR US GUIDE VASC ACCESS RIGHT  04/28/2018   LUNG LOBECTOMY  06/10/13   upper left lung   MULTIPLE TOOTH EXTRACTIONS     REMOVAL RETAINED LENS Right 11/17/2015   Procedure: REMOVAL RETAINED LENS FROAGMENTS RIGHT EYE;  Surgeon: [name], MD;  Location: [place];  Service: Ophthalmology;  Laterality: Right;   TONSILLECTOMY     UPPER GASTROINTESTINAL ENDOSCOPY  11-03-15   Dr [name]   VASECTOMY     VIDEO ASSISTED THORACOSCOPY (VATS)/WEDGE RESECTION Left 06/04/2013   Procedure: VIDEO ASSISTED THORACOSCOPY (VATS)/WEDGE RESECTION;  Surgeon: [name], MD;  Location: [place];  Service: Thoracic;  Laterality: Left;   VIDEO BRONCHOSCOPY N/A  06/04/2013   Procedure: VIDEO BRONCHOSCOPY;  Surgeon: [name], MD;  Location: [facility];  Service: Thoracic;  Laterality: N/A;    Allergies: Tape  Medications: Prior to Admission medications   Medication Sig Start Date End Date Taking? Authorizing Provider  amLODipine (NORVASC) 2.5 MG tablet Take 1 tablet (2.5 mg total) by mouth daily. 06/18/21   Teodora Medici, DO  aspirin EC 81 MG tablet Take 81 mg by mouth daily.     [provider]  atorvastatin (LIPITOR) 40 MG tablet Take 1 tablet (40 mg total) by mouth daily. for cholesterol. 12/15/20   Myles Gip, DO  b complex vitamins capsule Take 1 capsule by mouth daily.    [provider]  cholecalciferol (VITAMIN D3) 25 MCG (1000 UNIT) tablet Take 1,000 Units by mouth daily.    [provider]  Cinnamon 500 MG capsule Take 500 mg by mouth at bedtime.     [provider]  diphenhydrAMINE (BENADRYL) 25 mg capsule Take 25 mg by mouth every morning.     [provider]  DM-APAP-CPM (CORICIDIN HBP PO) Take 2 tablets by mouth daily as needed (allergies/cold symptoms).    [provider]  glucose blood (ONETOUCH VERIO) test strip USE 1 STRIP TO CHECK GLUCOSE ONCE DAILY 07/07/20   Myles Gip, DO  ipratropium (ATROVENT) 0.03 % nasal spray Place 2 sprays into both nostrils 3 (three) times daily. 04/09/21   Tyler Pita, MD  losartan (COZAAR) 100 MG tablet Take 1 tablet (100 mg total) by mouth daily. 12/15/20 04/09/21  Myles Gip, DO  Multiple Vitamins-Minerals (MULTIVITAMIN WITH MINERALS) tablet Take 1 tablet by mouth daily.     [provider]  polycarbophil (FIBERCON) 625 MG tablet Take 625 mg by mouth 2 (two) times daily.     [provider]  polyvinyl alcohol (LIQUIFILM TEARS) 1.4 % ophthalmic solution Place 1 drop into both eyes as needed for dry eyes.    [provider]  vitamin C (ASCORBIC ACID) 500 MG tablet Take 500 mg by mouth daily.      [provider]  zinc sulfate 220 (50 Zn) MG capsule Take 1 capsule (220 mg total) by mouth daily. 06/18/19   Allie Bossier, MD     Family History  Problem Relation Age of Onset   Stroke Mother    Alzheimer's disease Mother    Hypertension Sister    Hyperlipidemia Sister    Hypertension Brother    Hyperlipidemia Brother    Diabetes Brother     Social History   Socioeconomic History   Marital status: Married    Spouse name: [name]   Number of children: 3   Years of education: Not on file   Highest education level: High school graduate  Occupational History   Not on file  Tobacco Use   Smoking status: Former    Packs/day: 1.00    Years: 40.00    Pack years: 40.00    Types: Cigarettes    Quit date: 12/09/1998    Years since quitting: 22.5   Smokeless tobacco: Never  Vaping Use   Vaping Use: Never used  Substance and Sexual Activity   Alcohol use: No   Drug use: No   Sexual activity: Yes    Partners: Female  Other Topics Concern   Not on file  Social History Narrative   Not on file   Social Determinants of Health   Financial Resource Strain: Not on file  Food Insecurity: Not on file  Transportation Needs: Not on file  Physical Activity: Not on file  Stress: Not on file  Social Connections: Not on file    ECOG Status: 1 - Symptomatic but completely ambulatory  Review of Systems  Review of Systems: A 12 point ROS discussed and pertinent positives are indicated in the HPI above.  All other systems are negative.  Physical Exam No direct physical exam was performed telephone health visit only today Vital Signs: There were no vitals taken for this visit.  Imaging: CT CHEST ABDOMEN PELVIS W CONTRAST  Result Date: 06/14/2021 CLINICAL DATA:  Metastatic neuroendocrine tumor of the lung with liver metastases, history of left upper lobectomy and Y90 radioembolization EXAM: CT CHEST, ABDOMEN, AND PELVIS WITH CONTRAST TECHNIQUE: Multidetector CT imaging of  the chest, abdomen and pelvis was performed following the standard protocol during bolus administration of intravenous contrast. RADIATION DOSE REDUCTION: This exam was performed according to the  departmental dose-optimization program which includes automated exposure control, adjustment of the mA and/or kV according to patient size and/or use of iterative reconstruction technique. CONTRAST:  144m OMNIPAQUE IOHEXOL 300 MG/ML SOLN, additional oral enteric contrast COMPARISON:  12/12/2020 FINDINGS: CT CHEST FINDINGS Cardiovascular: Aortic atherosclerosis. Normal heart size. Left and right coronary artery calcifications. No pericardial effusion. Mediastinum/Nodes: No enlarged mediastinal, hilar, or axillary lymph nodes. Thyroid gland, trachea, and esophagus demonstrate no significant findings. Lungs/Pleura: Interval enlargement of nodular soft tissue of the anterior aspect of the left mainstem bronchus, this nodule now measuring approximately 1.0 x 0.7 cm, previously no greater than 0.6 x 0.5 cm (series 2, image 26). Status post left upper lobectomy. Moderate centrilobular and paraseptal emphysema. Irregular peripheral interstitial opacity, subpleural thickening, and areas of subpleural bronchiolectasis, most conspicuous in the right lung, unchanged. Additional irregular peripheral interstitial opacity with arcing, bandlike elements and subpleural sparing, unchanged. No pleural effusion or pneumothorax. Musculoskeletal: No chest wall mass. CT ABDOMEN PELVIS FINDINGS Hepatobiliary: Unchanged post treatment appearance of the liver, with capsular retraction about an isoenhancing mass of the anterior liver dome, hepatic segment VIII, measuring 3.0 x 2.2 cm (series 2, image 43). Similar post treatment appearance with capsular retraction about an isoenhancing mass of the anterior left lobe of the liver, hepatic segment III, measuring 1.9 x 1.9 cm (series 2, image 53). Contracted gallbladder. No gallbladder wall thickening,  or biliary dilatation. Pancreas: Unremarkable. No pancreatic ductal dilatation or surrounding inflammatory changes. Spleen: Normal in size without significant abnormality. Adrenals/Urinary Tract: Adrenal glands are unremarkable. Kidneys are normal, without renal calculi, solid lesion, or hydronephrosis. 1.2 cm rim calcified calculus in the dependent urinary bladder (series 2, image 108). Stomach/Bowel: Stomach is within normal limits. Appendix is surgically absent. No evidence of bowel wall thickening, distention, or inflammatory changes. Descending and sigmoid diverticulosis. Vascular/Lymphatic: Aortic atherosclerosis. Coil material in the gastroduodenal artery. No enlarged abdominal or pelvic lymph nodes. Reproductive: No mass or other abnormality. Other: No abdominal wall hernia or abnormality. No ascites. Musculoskeletal: No acute osseous findings. Multiple unchanged sclerotic osseous metastatic lesions, including of the inferior sternal body (series 6, image 87), the T1 vertebral body (series 3, image 14), T11 vertebral body (series 6, image 92), and the L2 and L3 vertebral bodies (series 6, image 87). Multiple soft tissue attenuation nodules in the subcutaneous fat of the buttocks, likely injection granulomata and unchanged (series 2, image 108). IMPRESSION: 1. Interval enlargement of nodular soft tissue of the anterior aspect of the left mainstem bronchus, this nodule now measuring approximately 1.0 x 0.7 cm, previously no greater than 0.6 x 0.5 cm. This finding is concerning for recurrent/metastatic disease. 2. Status post left upper lobectomy. 3. Unchanged post treatment appearance of the liver, with capsular retraction about masses of the anterior liver dome, hepatic segment VIII, and anterior left lobe of the liver, hepatic segment III. 4. Multiple unchanged sclerotic osseous metastatic lesions, including of the inferior sternal body, as well as the T1, T11, L2, and L3 vertebral bodies. 5. Bladder  calculus measuring 1.2 cm. 6. Emphysema. 7. Unchanged evidence of fibrotic interstitial lung disease, in a "probable UIP" pattern, as well as possible superimposed sequelae of COVID pneumonia. 8. Coronary artery disease. Aortic Atherosclerosis (ICD10-I70.0) and Emphysema (ICD10-J43.9). Electronically Signed   By: ADelanna AhmadiM.D.   On: 06/14/2021 20:59    Labs:  CBC: Recent Labs    03/13/21 0931 04/13/21 1058 05/13/21 1055 06/16/21 1002  WBC 10.1 11.5* 10.9* 9.8  HGB 15.6 15.8 15.7 15.8  HCT 46.8 48.1 48.1 47.9  PLT 168 200 186 169    COAGS: No results for input(s): INR, APTT in the last 8760 hours.  BMP: Recent Labs    03/13/21 0931 04/13/21 1058 05/13/21 1055 06/16/21 1002  NA 137 139 136 137  K 4.1 4.8 4.7 4.3  CL 103 102 104 102  CO2 _0 GLUCOSE 157* 109* 98 95  BUN _1 CALCIUM 9.2 9.6 9.8 9.6  CREATININE 1.04 0.85 1.01 1.07  GFRNONAA >60 >60 >60 >60    LIVER FUNCTION TESTS: Recent Labs    03/13/21 0931 04/13/21 1058 05/13/21 1055 06/16/21 1002  BILITOT 0.6 0.8 0.9 0.4  AST _2 ALT _3 ALKPHOS 110 108 116 102  PROT 7.0 7.3 7.5 7.0  ALBUMIN 4.1 4.3 4.4 4.2     Assessment and Plan:  Approximate 3 years status post whole liver Y90 radioembolization.  Stable hepatic changes.  No new or enlarging hepatic lesion, extrahepatic disease, or developing ascites.  Plan: Continue surveillance imaging with CT every 6 months at [facility].  He remains on monthly Sandostatin injection and is closely followed by Dr. Grayland Ormond and Dr. Patsey Berthold in [place].  Thank you for this interesting consult.  I greatly enjoyed meeting Endi W Wendling and look forward to participating in their care.  A copy of this report was sent to the requesting provider on this date.  Electronically Signed: Greggory Keen 06/18/2021, 2:53 PM   I spent a total of    25 Minutes in remote  clinical consultation, greater than 50% of which  was counseling/coordinating care for this patient with metastatic neuroendocrine tumor to the liver.    Visit type: Audio only (telephone). Audio (no video) only due to patient's lack of internet/smartphone capability. Alternative for in-person consultation at [facility], [address], [place], [place]. This visit type was conducted due to national recommendations for restrictions regarding the COVID-19 Pandemic (e.g. social distancing).  This format is felt to be most appropriate for this patient at this time.  All issues noted in this document were discussed and addressed.

## 2021-06-18 NOTE — Patient Instructions (Addendum)
It was great seeing you today!  Plan discussed at today's visit: -Blood work ordered today, results will be uploaded to [facility].  -Continue Losartan, added low dose Amlodipine, continue to check blood pressure at home   Follow up in: 3 months   Take care and let us know if you have any questions or concerns prior to your next visit.  Dr. Rosana Berger

## 2021-06-18 NOTE — Progress Notes (Signed)
Established Patient Office Visit  Subjective:  Patient ID: Jason Malone, male    DOB: 05/04/1941  Age: 81 y.o. MRN: [id number]  CC:  Chief Complaint  Patient presents with   Follow-up   Hypertension   Hyperlipidemia   Diabetes    HPI Jason Malone presents for follow up on chronic medical conditions.   Hypertension/A.Flutter: -Medications: Losartan 100  -Patient is compliant with above medications and reports no side effects. -Checking BP at home (average): 161/78 on average, was high at some recent appointments -Denies any SOB, CP, vision changes, LE edema or symptoms of hypotension. Does get dizzy when blood pressure high  HLD/CAD: -Medications: Lipitor 40 -Patient is compliant with above medications and reports no side effects.  -Last lipid panel: 2/21 Lipid Panel     Component Value Date/Time   CHOL 83 06/15/2019 0114   CHOL 133 02/14/2017 1046   TRIG 44 06/15/2019 0114   HDL 32 (L) 06/15/2019 0114   HDL 39 (L) 02/14/2017 1046   CHOLHDL 2.6 06/15/2019 0114   VLDL 9 06/15/2019 0114   LDLCALC 42 06/15/2019 0114   LDLCALC 53 04/11/2019 0000   LABVLDL 24 02/14/2017 1046    Diabetes, Type 2: -Last A1c 6.4 8/22 -Medications: None -Checking BG at home: doesn't check  -Eye exam: has an appointment in April  -Foot exam: due today -Microalbumin: due today -Statin: yes -PNA vaccine: 13 and 23  -Denies symptoms of hypoglycemia, polyuria, polydipsia, numbness extremities, foot ulcers/trauma.   COPD/Metastatic Lung Cancer/Post-inflammatory Lung Fibrosis: -Following with Pulmonology and IR -Most recent CT 06/12/21 showing interval enlargement of nodular soft tissue of left mainstem bronchus, unchanged appearance of liver, and multiple unchanged sclerotis osseous metastatic lesions.  -COPD status: controlled -Current medications: Breo discontinued by Pulmonology  -Satisfied with current treatment?: yes -Oxygen use: no -Dyspnea frequency: only with exertion -Cough  frequency: chronic non-productive cough -Rescue inhaler frequency:   -Limitation of activity: no -Productive cough: no -Pneumovax: Up to Date -Influenza: Up to Date   Past Medical History:  Diagnosis Date   Allergic rhinitis    Benign prostatic hypertrophy with lower urinary tract symptoms (LUTS)    Chest pain    a. 05/2013 MV: EF 70%, no ischemia.   Diverticulitis    DIVERTICULOSIS   Enlarged prostate    Essential hypertension    CONTROLLED ON MEDS   Full dentures    upper and lower   H/O hypokalemia    Hx of atrial fibrillation without current medication    CARDIOLOGIST-DR GOLLAN/ [facility]   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 08/28/2016   Hyperlipemia    Hypomagnesemia    IFG (impaired fasting glucose)    Liver cancer (HCC)    Liver cancer ([place]) 12/08/2016   Liver mass, left lobe 08/28/2016   Probably hemangioma; will get MRI of liver, refer to GI   Lung cancer ([place])    a. carcinoid, left lung, Stage 1b (T2a, N0, cM0);  b. 05/2013 s/p VATS & LULobectomy.   Monocytosis    Osteopenia    Paroxysmal atrial flutter (HCC)    a. 03/2013->no recurrence;  b. CHA2DS2VASc = 2-->not currently on anticoagulation;  c. 05/2012 Echo: EF 55-60%, normal RV.   Wears glasses     Past Surgical History:  Procedure Laterality Date   BRONCHOSCOPY     04/06/2013   CARDIOVASCULAR STRESS TEST  05/2013   a. No evidence of ischemia or infarct, EF 70%, no WMAs   CATARACT EXTRACTION  W/PHACO Left 07/14/2015   Procedure: CATARACT EXTRACTION PHACO AND INTRAOCULAR LENS PLACEMENT (IOC);  Surgeon: [name], MD;  Location: [place];  Service: Ophthalmology;  Laterality: Left;   CATARACT EXTRACTION W/PHACO Right 10/01/2015   Procedure: CATARACT EXTRACTION PHACO AND INTRAOCULAR LENS PLACEMENT (IOC);  Surgeon: [name], MD;  Location: [place];  Service: Ophthalmology;  Laterality: Right;   COLONOSCOPY W/ POLYPECTOMY     bleed after-had to go to surgery  to stop bleeding via colonoscopy   COLONOSCOPY WITH PROPOFOL N/A 11/19/2015   Procedure: COLONOSCOPY WITH PROPOFOL;  Surgeon: [name], MD;  Location: [facility] ENDOSCOPY;  Service: Endoscopy;  Laterality: N/A;   DUPUYTREN CONTRACTURE RELEASE  12/15/2011   Procedure: DUPUYTREN CONTRACTURE RELEASE;  Surgeon: [name], MD;  Location: [place];  Service: Orthopedics;  Laterality: Left;  Fasciotomy left ring finger dupuytrens   ELECTROMAGNETIC NAVIGATION BROCHOSCOPY Left 09/19/2019   Procedure: ELECTROMAGNETIC NAVIGATION BRONCHOSCOPY;  Surgeon: Tyler Pita, MD;  Location: [facility];  Service: Cardiopulmonary;  Laterality: Left;   FASCIECTOMY Right 11/07/2018   Procedure: SEGMENTAL FASCIECTOMY RIGHT RING FINGER;  Surgeon: Daryll Brod, MD;  Location: [place];  Service: Orthopedics;  Laterality: Right;  AXILLARY BLOCK   FLEXIBLE BRONCHOSCOPY Left 11/26/2019   Procedure: FLEXIBLE BRONCHOSCOPY WITH CRYO THERAPY;  Surgeon: Tyler Pita, MD;  Location: [facility];  Service: Cardiopulmonary;  Laterality: Left;   IR ANGIOGRAM SELECTIVE EACH ADDITIONAL VESSEL  07/26/2017   IR ANGIOGRAM SELECTIVE EACH ADDITIONAL VESSEL  07/26/2017   IR ANGIOGRAM SELECTIVE EACH ADDITIONAL VESSEL  07/26/2017   IR ANGIOGRAM SELECTIVE EACH ADDITIONAL VESSEL  07/26/2017   IR ANGIOGRAM SELECTIVE EACH ADDITIONAL VESSEL  07/26/2017   IR ANGIOGRAM SELECTIVE EACH ADDITIONAL VESSEL  08/11/2017   IR ANGIOGRAM SELECTIVE EACH ADDITIONAL VESSEL  04/28/2018   IR ANGIOGRAM VISCERAL SELECTIVE  07/26/2017   IR ANGIOGRAM VISCERAL SELECTIVE  08/11/2017   IR ANGIOGRAM VISCERAL SELECTIVE  04/28/2018   IR EMBO ARTERIAL NOT HEMORR HEMANG INC GUIDE ROADMAPPING  07/26/2017   IR EMBO TUMOR ORGAN ISCHEMIA INFARCT INC GUIDE ROADMAPPING  08/11/2017   IR EMBO TUMOR ORGAN ISCHEMIA INFARCT INC GUIDE ROADMAPPING  04/28/2018   IR RADIOLOGIST EVAL & MGMT  07/07/2017   IR RADIOLOGIST EVAL & MGMT  09/07/2017   IR RADIOLOGIST EVAL  & MGMT  12/21/2017   IR RADIOLOGIST EVAL & MGMT  03/22/2018   IR RADIOLOGIST EVAL & MGMT  06/08/2018   IR RADIOLOGIST EVAL & MGMT  11/01/2018   IR RADIOLOGIST EVAL & MGMT  03/21/2019   IR RADIOLOGIST EVAL & MGMT  11/08/2019   IR RADIOLOGIST EVAL & MGMT  07/01/2020   IR RADIOLOGIST EVAL & MGMT  12/18/2020   IR US GUIDE VASC ACCESS RIGHT  07/26/2017   IR US GUIDE VASC ACCESS RIGHT  08/11/2017   IR US GUIDE VASC ACCESS RIGHT  04/28/2018   LUNG LOBECTOMY  06/10/13   upper left lung   MULTIPLE TOOTH EXTRACTIONS     REMOVAL RETAINED LENS Right 11/17/2015   Procedure: REMOVAL RETAINED LENS FROAGMENTS RIGHT EYE;  Surgeon: [name], MD;  Location: [place];  Service: Ophthalmology;  Laterality: Right;   TONSILLECTOMY     UPPER GASTROINTESTINAL ENDOSCOPY  11-03-15   Dr [name]   VASECTOMY     VIDEO ASSISTED THORACOSCOPY (VATS)/WEDGE RESECTION Left 06/04/2013   Procedure: VIDEO ASSISTED THORACOSCOPY (VATS)/WEDGE RESECTION;  Surgeon: [name], MD;  Location: [place];  Service: Thoracic;  Laterality:  Left;   VIDEO BRONCHOSCOPY N/A 06/04/2013   Procedure: VIDEO BRONCHOSCOPY;  Surgeon: [name], MD;  Location: [facility] OR;  Service: Thoracic;  Laterality: N/A;    Family History  Problem Relation Age of Onset   Stroke Mother    Alzheimer's disease Mother    Hypertension Sister    Hyperlipidemia Sister    Hypertension Brother    Hyperlipidemia Brother    Diabetes Brother     Social History   Socioeconomic History   Marital status: Married    Spouse name: [name]   Number of children: 3   Years of education: Not on file   Highest education level: High school graduate  Occupational History   Not on file  Tobacco Use   Smoking status: Former    Packs/day: 1.00    Years: 40.00    Pack years: 40.00    Types: Cigarettes    Quit date: 12/09/1998    Years since quitting: 22.5   Smokeless tobacco: Never  Vaping Use   Vaping Use: Never used  Substance and Sexual Activity    Alcohol use: No   Drug use: No   Sexual activity: Yes    Partners: Female  Other Topics Concern   Not on file  Social History Narrative   Not on file   Social Determinants of Health   Financial Resource Strain: Not on file  Food Insecurity: Not on file  Transportation Needs: Not on file  Physical Activity: Not on file  Stress: Not on file  Social Connections: Not on file  Intimate Partner Violence: Not on file    Outpatient Medications Prior to Visit  Medication Sig Dispense Refill   aspirin EC 81 MG tablet Take 81 mg by mouth daily.      atorvastatin (LIPITOR) 40 MG tablet Take 1 tablet (40 mg total) by mouth daily. for cholesterol. 90 tablet 3   b complex vitamins capsule Take 1 capsule by mouth daily.     cholecalciferol (VITAMIN D3) 25 MCG (1000 UNIT) tablet Take 1,000 Units by mouth daily.     Cinnamon 500 MG capsule Take 500 mg by mouth at bedtime.      diphenhydrAMINE (BENADRYL) 25 mg capsule Take 25 mg by mouth every morning.      DM-APAP-CPM (CORICIDIN HBP PO) Take 2 tablets by mouth daily as needed (allergies/cold symptoms).     fluticasone furoate-vilanterol (BREO ELLIPTA) 100-25 MCG/ACT AEPB Inhale 1 puff into the lungs daily. 28 each 5   glucose blood (ONETOUCH VERIO) test strip USE 1 STRIP TO CHECK GLUCOSE ONCE DAILY 100 each 3   ipratropium (ATROVENT) 0.03 % nasal spray Place 2 sprays into both nostrils 3 (three) times daily. 30 mL 3   losartan (COZAAR) 100 MG tablet Take 1 tablet (100 mg total) by mouth daily. 90 tablet 3   Multiple Vitamins-Minerals (MULTIVITAMIN WITH MINERALS) tablet Take 1 tablet by mouth daily.      polycarbophil (FIBERCON) 625 MG tablet Take 625 mg by mouth 2 (two) times daily.      vitamin C (ASCORBIC ACID) 500 MG tablet Take 500 mg by mouth daily.      zinc sulfate 220 (50 Zn) MG capsule Take 1 capsule (220 mg total) by mouth daily. 30 capsule 0   Facility-Administered Medications Prior to Visit  Medication Dose Route Frequency  Provider Last Rate Last Admin   lanreotide acetate (SOMATULINE DEPOT) injection 120 mg  120 mg Subcutaneous Once Lloyd Huger, MD  Allergies  Allergen Reactions   Tape Itching    Surgical tapes     ROS Review of Systems  Constitutional:  Negative for chills and fever.  Eyes:  Negative for visual disturbance.  Respiratory:  Negative for cough and shortness of breath.   Cardiovascular:  Negative for chest pain.  Gastrointestinal:  Negative for abdominal pain.  Neurological:  Positive for dizziness. Negative for headaches.     Objective:    Physical Exam Constitutional:      Appearance: Normal appearance.  HENT:     Head: Normocephalic and atraumatic.  Eyes:     Conjunctiva/sclera: Conjunctivae normal.  Cardiovascular:     Rate and Rhythm: Normal rate and regular rhythm.     Pulses:          Dorsalis pedis pulses are 2+ on the right side and 2+ on the left side.  Pulmonary:     Effort: Pulmonary effort is normal.     Breath sounds: Normal breath sounds.  Musculoskeletal:     Right lower leg: No edema.     Left lower leg: No edema.     Right foot: Normal range of motion. No deformity, bunion, Charcot foot, foot drop or prominent metatarsal heads.     Left foot: Normal range of motion. No deformity, bunion, Charcot foot, foot drop or prominent metatarsal heads.  Feet:     Right foot:     Protective Sensation: 6 sites tested.  6 sites sensed.     Skin integrity: Skin integrity normal.     Toenail Condition: Right toenails are normal.     Left foot:     Protective Sensation: 6 sites tested.  6 sites sensed.     Skin integrity: Skin integrity normal.     Toenail Condition: Left toenails are normal.  Skin:    General: Skin is warm and dry.  Neurological:     General: No focal deficit present.     Mental Status: He is alert. Mental status is at baseline.  Psychiatric:        Mood and Affect: Mood normal.        Behavior: Behavior normal.    BP 136/78     Pulse 97    Temp 98 F (36.7 C)    Resp 16    Ht _0  (1.626 m)    Wt 161 lb 11.2 oz (73.3 kg)    SpO2 97%    BMI 27.76 kg/m  Wt Readings from Last 3 Encounters:  06/18/21 161 lb 11.2 oz (73.3 kg)  06/17/21 161 lb 3.2 oz (73.1 kg)  06/16/21 161 lb 4.8 oz (73.2 kg)     Health Maintenance Due  Topic Date Due   OPHTHALMOLOGY EXAM  Never done   Hepatitis C Screening  Never done   Zoster Vaccines- Shingrix (1 of 2) Never done   URINE MICROALBUMIN  05/23/2021   HEMOGLOBIN A1C  06/17/2021    There are no preventive care reminders to display for this patient.  Lab Results  Component Value Date   TSH 4.221 06/08/2013   Lab Results  Component Value Date   WBC 9.8 06/16/2021   HGB 15.8 06/16/2021   HCT 47.9 06/16/2021   MCV 92.3 06/16/2021   PLT 169 06/16/2021   Lab Results  Component Value Date   NA 137 06/16/2021   K 4.3 06/16/2021   CO2 26 06/16/2021   GLUCOSE 95 06/16/2021   BUN 22 06/16/2021   CREATININE 1.07 06/16/2021  BILITOT 0.4 06/16/2021   ALKPHOS 102 06/16/2021   AST 22 06/16/2021   ALT 22 06/16/2021   PROT 7.0 06/16/2021   ALBUMIN 4.2 06/16/2021   CALCIUM 9.6 06/16/2021   ANIONGAP 9 06/16/2021   Lab Results  Component Value Date   CHOL 83 06/15/2019   Lab Results  Component Value Date   HDL 32 (L) 06/15/2019   Lab Results  Component Value Date   LDLCALC 42 06/15/2019   Lab Results  Component Value Date   TRIG 44 06/15/2019   Lab Results  Component Value Date   CHOLHDL 2.6 06/15/2019   Lab Results  Component Value Date   HGBA1C 6.4 (A) 12/15/2020      Assessment & Plan:   1. Type 2 diabetes mellitus without complication, without long-term current use of insulin ([place]): Stable, recheck A1c and urine.  - Hemoglobin A1C - Urine Microalbumin w/creat. ratio  2. Encounter for hepatitis C screening test for low risk patient: Due for screening.   - Hepatitis C Antibody  3. Essential hypertension: The patient is very concerned about his  blood pressure being high at home, it was 136/78 here. He will continue Losartan and add 2.5 Amlodipine daily, continue checking blood pressures. Follow up in 3 months for recheck.   - amLODipine (NORVASC) 2.5 MG tablet; Take 1 tablet (2.5 mg total) by mouth daily.  Dispense: 30 tablet; Refill: 1  4. Mixed hyperlipidemia: Stable, continue statin and recheck lipid panel.  - Lipid Profile  Follow-up: Return in about 3 months (around 09/15/2021).    Teodora Medici, DO

## 2021-06-22 ENCOUNTER — Encounter: Payer: Self-pay | Admitting: Pulmonary Disease

## 2021-06-23 ENCOUNTER — Encounter: Payer: Self-pay | Admitting: Urgent Care

## 2021-06-23 ENCOUNTER — Encounter: Payer: Self-pay | Admitting: Pulmonary Disease

## 2021-06-23 ENCOUNTER — Other Ambulatory Visit
Admission: RE | Admit: 2021-06-23 | Discharge: 2021-06-23 | Disposition: A | Discharge status: Home / Self Care | Payer: Medicare Other | Source: Ambulatory Visit | Attending: Pulmonary Disease | Admitting: Pulmonary Disease

## 2021-06-23 ENCOUNTER — Other Ambulatory Visit: Payer: Self-pay

## 2021-06-23 HISTORY — DX: Cardiac arrhythmia, unspecified: I49.9

## 2021-06-23 HISTORY — DX: Chronic obstructive pulmonary disease, unspecified: J44.9

## 2021-06-23 NOTE — Patient Instructions (Addendum)
Your procedure is scheduled on: Monday June 29, 2021. Report to Day Surgery inside [facility] 2nd floor, stop by admissions desk before getting on elevator.  To find out your arrival time please call [phone] between 1PM - 3PM on Friday June 26, 2021.  Remember: Instructions that are not followed completely may result in serious medical risk,  up to and including death, or upon the discretion of your surgeon and anesthesiologist your  surgery may need to be rescheduled.     _X__ 1. Do not eat food or drink fluids after midnight the night before your procedure.                 No chewing gum or hard candies.   __X__2.  On the morning of surgery brush your teeth with toothpaste and water, you                may rinse your mouth with mouthwash if you wish.  Do not swallow any toothpaste or mouthwash.     _X__ 3.  No Alcohol for 24 hours before or after surgery.   _X__ 4.  Do Not Smoke or use e-cigarettes For 24 Hours Prior to Your Surgery.                 Do not use any chewable tobacco products for at least 6 hours prior to                 Surgery.  _X__  5.  Do not use any recreational drugs (marijuana, cocaine, heroin, ecstasy, MDMA or other)                For at least one week prior to your surgery.  Combination of these drugs with anesthesia                May have life threatening results.  ____  6.  Bring all medications with you on the day of surgery if instructed.   __X__  7.  Notify your doctor if there is any change in your medical condition      (cold, fever, infections).     Do not wear jewelry, make-up, hairpins, clips or nail polish. Do not wear lotions, powders, or perfumes. You may wear deodorant. Do not shave 48 hours prior to surgery. Men may shave face and neck. Do not bring valuables to the hospital.    [facility] is not responsible for any belongings or valuables.  Contacts, dentures or bridgework may not be worn into  surgery. Leave your suitcase in the car. After surgery it may be brought to your room. For patients admitted to the hospital, discharge time is determined by your treatment team.   Patients discharged the day of surgery will not be allowed to drive home.   Make arrangements for someone to be with you for the first 24 hours of your Same Day Discharge.  __X__ Take these medicines the morning of surgery with A SIP OF WATER:    1. amLODipine (NORVASC) 2.5 MG  2.   3.   4.  5.  6.  ____ Fleet Enema (as directed)   ____ Use CHG Soap (or wipes) as directed  ____ Use Benzoyl Peroxide Gel as instructed  ____ Use inhalers on the day of surgery  ____ Stop metformin 2 days prior to surgery    ____ Take 1/2 of usual insulin dose the night before surgery. No insulin the morning  of surgery.   __X__ Stop aspirin 81 mg 5 days before your surgery.   __X__ One Week prior to surgery- Stop Anti-inflammatories such as Ibuprofen, Aleve, Advil, Motrin, meloxicam (MOBIC), diclofenac, etodolac, ketorolac, Toradol, Daypro, piroxicam, Goody's or BC powders. OK TO USE TYLENOL IF NEEDED   __X__ Stop supplements until after surgery.    ____ Bring C-Pap to the hospital.    If you have any questions regarding your pre-procedure instructions,  Please call Pre-admit Testing at [phone]

## 2021-06-24 ENCOUNTER — Other Ambulatory Visit
Admission: RE | Admit: 2021-06-24 | Discharge: 2021-06-24 | Disposition: A | Discharge status: Home / Self Care | Payer: Medicare Other | Source: Ambulatory Visit | Attending: Pulmonary Disease | Admitting: Pulmonary Disease

## 2021-06-24 DIAGNOSIS — Z01810 Encounter for preprocedural cardiovascular examination: Secondary | ICD-10-CM | POA: Diagnosis not present

## 2021-06-24 DIAGNOSIS — I10 Essential (primary) hypertension: Secondary | ICD-10-CM | POA: Diagnosis not present

## 2021-06-26 ENCOUNTER — Other Ambulatory Visit
Admission: RE | Admit: 2021-06-26 | Discharge: 2021-06-26 | Disposition: A | Discharge status: Home / Self Care | Payer: Medicare Other | Source: Ambulatory Visit | Attending: Pulmonary Disease | Admitting: Pulmonary Disease

## 2021-06-26 ENCOUNTER — Telehealth: Payer: Self-pay | Admitting: Pulmonary Disease

## 2021-06-26 DIAGNOSIS — Z20822 Contact with and (suspected) exposure to covid-19: Secondary | ICD-10-CM

## 2021-06-26 DIAGNOSIS — Z01812 Encounter for preprocedural laboratory examination: Secondary | ICD-10-CM | POA: Insufficient documentation

## 2021-06-26 DIAGNOSIS — U071 COVID-19: Secondary | ICD-10-CM | POA: Insufficient documentation

## 2021-06-26 LAB — SARS CORONAVIRUS 2 (TAT 6-24 HRS): SARS Coronavirus 2: POSITIVE — AB

## 2021-06-26 MED ORDER — AZITHROMYCIN 250 MG PO TABS: ORAL_TABLET | ORAL | 0 refills | Status: AC

## 2021-06-26 NOTE — Telephone Encounter (Signed)
Spoke to patient.  He is scheduled for bronch 06/29/2021. He has developed a cold. C/o runny nose, dry cough, sore throat and low grade of 99 last night. Sx started yesterday. Negative home covid test last night.   He had PCR test this morning.   Dr. Patsey Berthold, please advise. Thanks.

## 2021-06-26 NOTE — Telephone Encounter (Signed)
Present I do not think he needs to reschedule what I would recommend is starting some Azithromycin just as a preventive measure.  If he does test positive for COVID then we would have to withhold.

## 2021-06-26 NOTE — Telephone Encounter (Signed)
Patient is aware of recommendations and voiced his understanding.  Zpak sent to preferred pharmacy.  Nothing further needed.

## 2021-06-27 ENCOUNTER — Other Ambulatory Visit: Payer: Self-pay

## 2021-06-27 ENCOUNTER — Telehealth: Payer: Self-pay | Admitting: Internal Medicine

## 2021-06-27 ENCOUNTER — Emergency Department: Payer: Medicare Other

## 2021-06-27 ENCOUNTER — Emergency Department
Admission: EM | Admit: 2021-06-27 | Discharge: 2021-06-27 | Disposition: A | Discharge status: Home / Self Care | Payer: Medicare Other | Attending: Emergency Medicine | Admitting: Emergency Medicine

## 2021-06-27 DIAGNOSIS — R059 Cough, unspecified: Secondary | ICD-10-CM | POA: Diagnosis present

## 2021-06-27 DIAGNOSIS — U071 COVID-19: Secondary | ICD-10-CM | POA: Diagnosis not present

## 2021-06-27 MED ORDER — NIRMATRELVIR/RITONAVIR (PAXLOVID)TABLET
3.0000 | ORAL_TABLET | Freq: Two times a day (BID) | ORAL | Status: DC
  Administered 2021-06-27: 3 via ORAL
  Filled 2021-06-27: qty 30

## 2021-06-27 MED ORDER — BENZONATATE 100 MG PO CAPS: 100.0000 mg | ORAL_CAPSULE | Freq: Three times a day (TID) | ORAL | 0 refills | Status: DC | PRN

## 2021-06-27 MED ORDER — ONDANSETRON 4 MG PO TBDP: 4.0000 mg | ORAL_TABLET | Freq: Three times a day (TID) | ORAL | 0 refills | Status: AC | PRN

## 2021-06-27 NOTE — ED Notes (Signed)
Provider evaluated pt at bedside.

## 2021-06-27 NOTE — Telephone Encounter (Signed)
Now tested positive for COVID  Already on Zmax and sen in ER   Will need to reschedule procedure

## 2021-06-27 NOTE — ED Triage Notes (Signed)
Pt via POV from home. Pt states he tested positive for COVID. Pt wants to be seen here. Pt c/o cough and sore throat. Pt is A&Ox4 and NAD. Ambulatory to triage

## 2021-06-27 NOTE — ED Provider Notes (Signed)
[facility] Provider Note    Event Date/Time   First MD Initiated Contact with Patient 06/27/21 1343     (approximate)   History   Cough and Sore Throat   HPI  Jason Malone is a 81 y.o. male with lung cancer, history of lung cancer who comes in with concern for being positive for COVID.  Patient is day 3 of symptoms including cough and sore throat.  Patient reports eating and drinking okay.  Denies any significant shortness of breath or chest discomfort.  He reports just the coughing is uncomfortable.  He states that he is here because he wants to get started on antiviral therapy due to him having his comorbidities and the fact that he when he had COVID previously had had to be hospitalized for 2 weeks.  However he does not feel as ill as he was at that time.  Physical Exam   Triage Vital Signs: ED Triage Vitals  Enc Vitals Group     BP 06/27/21 1232 (!) 159/82     Pulse Rate 06/27/21 1232 64     Resp 06/27/21 1232 19     Temp 06/27/21 1232 98.7 F (37.1 C)     Temp Source 06/27/21 1232 Oral     SpO2 06/27/21 1232 99 %     Weight 06/27/21 1232 156 lb (70.8 kg)     Height 06/27/21 1232 5\' 4"  (1.626 m)     Head Circumference --      Peak Flow --      Pain Score 06/27/21 1233 0     Pain Loc --      Pain Edu? --      Excl. in [place]? --     Most recent vital signs: Vitals:   06/27/21 1232  BP: (!) 159/82  Pulse: 64  Resp: 19  Temp: 98.7 F (37.1 C)  SpO2: 99%     General: Awake, no distress.  OP is clear  CV:  Good peripheral perfusion.  Resp:  Normal effort. Speaking full sentence  Abd:  No distention. Soft nontender  Other:    ED Results / Procedures / Treatments    RADIOLOGY I have reviewed the xray personally and dont see any pnuemonia agree   IMPRESSION: Chronic changes with no acute process identified.       MEDICATIONS ORDERED IN ED: Medications  nirmatrelvir/ritonavir EUA (PAXLOVID) 3 tablet (has no administration in  time range)     IMPRESSION / MDM / ASSESSMENT AND PLAN / ED COURSE  I reviewed the triage vital signs and the nursing notes.  Patient with comorbidities and high risk for COVID he comes in with concerns for recently positive COVID test wanting to be started on antiviral therapy.  We discussed repeating blood work today but he denies any difficulties eating or drinking and that he had labs drawn on the seventh that were reassuring.  Denies any significant shortness of breath or chest pain at this time.  Chest x-ray ordered from triage without evidence of pneumothorax or concurrent pneumonia.  Discussed with pharmacy and patient meets criteria for paxlovid.  Discussed with patient holding his atorvastatin.  Patient feels comfortable with discharge home and will return if he develops worsening shortness of breath or any other concerns   FINAL CLINICAL IMPRESSION(S) / ED DIAGNOSES   Final diagnoses:  COVID-19     Rx / DC Orders   ED Discharge Orders  Ordered    benzonatate (TESSALON PERLES) 100 MG capsule  3 times daily PRN        06/27/21 1449    ondansetron (ZOFRAN-ODT) 4 MG disintegrating tablet  Every 8 hours PRN        06/27/21 1449             Note:  This document was prepared using Dragon voice recognition software and may include unintentional dictation errors.   Vanessa [place], MD 06/27/21 1450

## 2021-06-27 NOTE — Discharge Instructions (Signed)
The most important medication is the Paxlovid which I have given you here but I have also prescribed some Zofran and Tessalon Perles to help with symptoms if you need them.  Return to the ER if develop worsening shortness of breath or any other concerns

## 2021-06-28 MED ORDER — LACTATED RINGERS IV SOLN: INTRAVENOUS | Status: DC

## 2021-06-28 MED ORDER — CHLORHEXIDINE GLUCONATE 0.12 % MT SOLN: 15.0000 mL | Freq: Once | OROMUCOSAL | Status: DC

## 2021-06-28 MED ORDER — ORAL CARE MOUTH RINSE: 15.0000 mL | Freq: Once | OROMUCOSAL | Status: DC

## 2021-06-28 MED ORDER — IPRATROPIUM-ALBUTEROL 0.5-2.5 (3) MG/3ML IN SOLN: 3.0000 mL | Freq: Once | RESPIRATORY_TRACT | Status: DC

## 2021-06-29 ENCOUNTER — Ambulatory Visit: Admission: RE | Admit: 2021-06-29 | Payer: Medicare Other | Source: Home / Self Care | Admitting: Pulmonary Disease

## 2021-06-29 ENCOUNTER — Encounter: Admission: RE | Payer: Self-pay | Source: Home / Self Care

## 2021-06-29 SURGERY — VIDEO BRONCHOSCOPY WITHOUT FLUORO
Anesthesia: General | Laterality: Left

## 2021-06-29 NOTE — Telephone Encounter (Signed)
Spoke to Jason Malone with OR and canceled procedure. Patient is aware and voiced his understanding.  Nothing further needed.

## 2021-07-14 ENCOUNTER — Inpatient Hospital Stay: Payer: Medicare Other | Attending: Oncology

## 2021-07-14 ENCOUNTER — Other Ambulatory Visit: Payer: Self-pay

## 2021-07-14 ENCOUNTER — Inpatient Hospital Stay: Payer: Medicare Other

## 2021-07-14 ENCOUNTER — Telehealth: Payer: Self-pay | Admitting: Pulmonary Disease

## 2021-07-14 DIAGNOSIS — C7A8 Other malignant neuroendocrine tumors: Secondary | ICD-10-CM | POA: Diagnosis not present

## 2021-07-14 DIAGNOSIS — Z79899 Other long term (current) drug therapy: Secondary | ICD-10-CM | POA: Insufficient documentation

## 2021-07-14 LAB — CBC WITH DIFFERENTIAL/PLATELET
Abs Immature Granulocytes: 0.04 10*3/uL (ref 0.00–0.07)
Basophils Absolute: 0.1 10*3/uL (ref 0.0–0.1)
Basophils Relative: 1 %
Eosinophils Absolute: 0.1 10*3/uL (ref 0.0–0.5)
Eosinophils Relative: 1 %
HCT: 45.8 % (ref 39.0–52.0)
Hemoglobin: 15.5 g/dL (ref 13.0–17.0)
Immature Granulocytes: 0 %
Lymphocytes Relative: 12 %
Lymphs Abs: 1.1 10*3/uL (ref 0.7–4.0)
MCH: 30.8 pg (ref 26.0–34.0)
MCHC: 33.8 g/dL (ref 30.0–36.0)
MCV: 91.1 fL (ref 80.0–100.0)
Monocytes Absolute: 1.5 10*3/uL — ABNORMAL HIGH (ref 0.1–1.0)
Monocytes Relative: 16 %
Neutro Abs: 6.7 10*3/uL (ref 1.7–7.7)
Neutrophils Relative %: 70 %
Platelets: 246 10*3/uL (ref 150–400)
RBC: 5.03 MIL/uL (ref 4.22–5.81)
RDW: 14 % (ref 11.5–15.5)
WBC: 9.6 10*3/uL (ref 4.0–10.5)
nRBC: 0 % (ref 0.0–0.2)

## 2021-07-14 LAB — COMPREHENSIVE METABOLIC PANEL
ALT: 27 U/L (ref 0–44)
AST: 25 U/L (ref 15–41)
Albumin: 4.1 g/dL (ref 3.5–5.0)
Alkaline Phosphatase: 112 U/L (ref 38–126)
Anion gap: 7 (ref 5–15)
BUN: 21 mg/dL (ref 8–23)
CO2: 28 mmol/L (ref 22–32)
Calcium: 9.6 mg/dL (ref 8.9–10.3)
Chloride: 103 mmol/L (ref 98–111)
Creatinine, Ser: 1.08 mg/dL (ref 0.61–1.24)
GFR, Estimated: >60 mL/min (ref >60)
Glucose, Bld: 112 mg/dL — ABNORMAL HIGH (ref 70–99)
Potassium: 4.4 mmol/L (ref 3.5–5.1)
Sodium: 138 mmol/L (ref 135–145)
Total Bilirubin: 0.9 mg/dL (ref 0.3–1.2)
Total Protein: 7 g/dL (ref 6.5–8.1)

## 2021-07-14 MED ORDER — LANREOTIDE ACETATE 120 MG/0.5ML ~~LOC~~ SOLN
120.0000 mg | Freq: Once | SUBCUTANEOUS | Status: AC
  Administered 2021-07-14: 120 mg via SUBCUTANEOUS
  Filled 2021-07-14: qty 120

## 2021-07-14 NOTE — Telephone Encounter (Signed)
Lets going ahead and see him on the 28th that way I can redo his H&P because the prior visit notes cannot be used at that time.  It would also give me an idea of how well he has recovered. ?

## 2021-07-14 NOTE — Telephone Encounter (Signed)
Patient is aware of below message and voiced his understanding.  Nothing further needed.   

## 2021-07-14 NOTE — Telephone Encounter (Signed)
Spoke to patient.  ?He would like to schedule bronch. He has an upcoming appt scheduled for 08/04/2021. ? ?Dr. Patsey Berthold, please advise. Thanks ?

## 2021-07-15 ENCOUNTER — Ambulatory Visit (INDEPENDENT_AMBULATORY_CARE_PROVIDER_SITE_OTHER): Payer: Medicare Other | Admitting: Internal Medicine

## 2021-07-15 ENCOUNTER — Encounter: Payer: Self-pay | Admitting: Internal Medicine

## 2021-07-15 VITALS — BP 155/82 | HR 66 | Temp 98.0°F | Resp 16 | Ht 64.0 in | Wt 159.5 lb

## 2021-07-15 DIAGNOSIS — I10 Essential (primary) hypertension: Secondary | ICD-10-CM

## 2021-07-15 DIAGNOSIS — Z8616 Personal history of COVID-19: Secondary | ICD-10-CM | POA: Diagnosis not present

## 2021-07-15 LAB — CHROMOGRANIN A: Chromogranin A (ng/mL): 763.2 ng/mL — ABNORMAL HIGH (ref 0.0–101.8)

## 2021-07-15 MED ORDER — AMLODIPINE BESYLATE 5 MG PO TABS: 5.0000 mg | ORAL_TABLET | Freq: Two times a day (BID) | ORAL | 3 refills | Status: DC

## 2021-07-15 NOTE — Patient Instructions (Addendum)
It was great seeing you today! ? ?Plan discussed at today's visit: ?-Continue Losartan 100 mg daily, Take Amlodipine 5 mg twice a day and continue to check blood pressure at home. If blood pressure is low (<120/80) can decrease back to the 7.5 mg.  ?-If blood pressure high (>200/100) and you have symptoms like headache, shortness of breath, chest pain, please go to ER. ? ?Follow up in: 1 month  ? ?Take care and let us know if you have any questions or concerns prior to your next visit. ? ?Dr. Rosana Berger ? ?

## 2021-07-15 NOTE — Assessment & Plan Note (Signed)
Continue Losartan 100, increase Amlodipine to 5 mg BID, continue to monitor closely and can start taking once daily if blood pressure becomes too low. Follow up here in 1 month for recheck. ?

## 2021-07-15 NOTE — Progress Notes (Signed)
Established Patient Office Visit  Subjective:  Patient ID: Jason Malone, male    DOB: July 06, 1940  Age: 81 y.o. MRN: [id number]  CC:  Chief Complaint  Patient presents with   Hypertension    Pt states since covid his bp has been running high.  He states he started taking 3 of the norvasc tablets daily of the 2.80m    HPI Jason Malone presents for follow-up on blood pressure.   Hypertension/A.Flutter: -Medications: Losartan 100, amlodipine 2.5 mg daily added at the last office visit - has been taking 3 tablets since COVID last month on 06/27/21 for the last 10 days -Patient is compliant with above medications and reports no side effects. -Checking BP at home (average): 140-160 since having COVID  -Denies any SOB, CP, vision changes, LE edema or symptoms of hypotension. Having some facial flushing and headaches recently from HTN.   COPD/Metastatic Lung Cancer/Post-inflammatory Lung Fibrosis: -Following with Pulmonology and IR -Most recent CT 06/12/21 showing interval enlargement of nodular soft tissue of left mainstem bronchus, unchanged appearance of liver, and multiple unchanged sclerotis osseous metastatic lesions.  -COPD status: controlled -Current medications: Breo discontinued by Pulmonology  -Satisfied with current treatment?: yes -Oxygen use: no -Dyspnea frequency: only with exertion -Cough frequency: chronic non-productive cough -Rescue inhaler frequency:   -Limitation of activity: no -Productive cough: no -Pneumovax: Up to Date -Influenza: Up to Date -Seeing the Pulmonologist 07/07/21, plan to reschedule bronchoscopy   Past Medical History:  Diagnosis Date   Allergic rhinitis    Benign prostatic hypertrophy with lower urinary tract symptoms (LUTS)    Chest pain    a. 05/2013 MV: EF 70%, no ischemia.   COPD (chronic obstructive pulmonary disease) (HCC)    Diverticulitis    DIVERTICULOSIS   Dysrhythmia    Enlarged prostate    Essential hypertension    CONTROLLED ON  MEDS   Full dentures    H/O hypokalemia    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 08/28/2016   Hyperlipemia    Hypomagnesemia    IFG (impaired fasting glucose)    Liver cancer ([facility] 12/08/2016   Liver mass, left lobe 08/28/2016   Probably hemangioma; will get MRI of liver, refer to GI   Lung cancer ([facility]    a. carcinoid, left lung, Stage 1b (T2a, N0, cM0);  b. 05/2013 s/p VATS & LULobectomy.   Monocytosis    Osteopenia    Paroxysmal atrial flutter (HCC)    a. 03/2013->no recurrence;  b. CHA2DS2VASc = 2-->not currently on anticoagulation;  c. 05/2012 Echo: EF 55-60%, normal RV.   Wears glasses     Past Surgical History:  Procedure Laterality Date   BRONCHOSCOPY     04/06/2013   CARDIOVASCULAR STRESS TEST  05/2013   a. No evidence of ischemia or infarct, EF 70%, no WMAs   CATARACT EXTRACTION W/PHACO Left 07/14/2015   Procedure: CATARACT EXTRACTION PHACO AND INTRAOCULAR LENS PLACEMENT (IOC);  Surgeon: CLeandrew Koyanagi MD;  [facility]  Service: Ophthalmology;  Laterality: Left;   CATARACT EXTRACTION W/PHACO Right 10/01/2015   Procedure: CATARACT EXTRACTION PHACO AND INTRAOCULAR LENS PLACEMENT (IOC);  Surgeon: CLeandrew Koyanagi MD;  [facility]  Service: Ophthalmology;  Laterality: Right;   COLONOSCOPY W/ POLYPECTOMY     bleed after-had to go to surgery to stop bleeding via colonoscopy   COLONOSCOPY WITH PROPOFOL N/A 11/19/2015   Procedure: COLONOSCOPY WITH PROPOFOL;  Surgeon: JRobert Bellow MD;  Location: [facility];  Service: Endoscopy;  Laterality: N/A;   DUPUYTREN CONTRACTURE RELEASE  12/15/2011   Procedure: DUPUYTREN CONTRACTURE RELEASE;  Surgeon: [name], MD;  Location: [place];  Service: Orthopedics;  Laterality: Left;  Fasciotomy left ring finger dupuytrens   ELECTROMAGNETIC NAVIGATION BROCHOSCOPY Left 09/19/2019   Procedure: ELECTROMAGNETIC NAVIGATION BRONCHOSCOPY;  Surgeon: Tyler Pita,  MD;  Location: [facility];  Service: Cardiopulmonary;  Laterality: Left;   FASCIECTOMY Right 11/07/2018   Procedure: SEGMENTAL FASCIECTOMY RIGHT RING FINGER;  Surgeon: Daryll Brod, MD;  Location: [place];  Service: Orthopedics;  Laterality: Right;  AXILLARY BLOCK   FLEXIBLE BRONCHOSCOPY Left 11/26/2019   Procedure: FLEXIBLE BRONCHOSCOPY WITH CRYO THERAPY;  Surgeon: Tyler Pita, MD;  Location: [facility];  Service: Cardiopulmonary;  Laterality: Left;   IR ANGIOGRAM SELECTIVE EACH ADDITIONAL VESSEL  07/26/2017   IR ANGIOGRAM SELECTIVE EACH ADDITIONAL VESSEL  07/26/2017   IR ANGIOGRAM SELECTIVE EACH ADDITIONAL VESSEL  07/26/2017   IR ANGIOGRAM SELECTIVE EACH ADDITIONAL VESSEL  07/26/2017   IR ANGIOGRAM SELECTIVE EACH ADDITIONAL VESSEL  07/26/2017   IR ANGIOGRAM SELECTIVE EACH ADDITIONAL VESSEL  08/11/2017   IR ANGIOGRAM SELECTIVE EACH ADDITIONAL VESSEL  04/28/2018   IR ANGIOGRAM VISCERAL SELECTIVE  07/26/2017   IR ANGIOGRAM VISCERAL SELECTIVE  08/11/2017   IR ANGIOGRAM VISCERAL SELECTIVE  04/28/2018   IR EMBO ARTERIAL NOT HEMORR HEMANG INC GUIDE ROADMAPPING  07/26/2017   IR EMBO TUMOR ORGAN ISCHEMIA INFARCT INC GUIDE ROADMAPPING  08/11/2017   IR EMBO TUMOR ORGAN ISCHEMIA INFARCT INC GUIDE ROADMAPPING  04/28/2018   IR RADIOLOGIST EVAL & MGMT  07/07/2017   IR RADIOLOGIST EVAL & MGMT  09/07/2017   IR RADIOLOGIST EVAL & MGMT  12/21/2017   IR RADIOLOGIST EVAL & MGMT  03/22/2018   IR RADIOLOGIST EVAL & MGMT  06/08/2018   IR RADIOLOGIST EVAL & MGMT  11/01/2018   IR RADIOLOGIST EVAL & MGMT  03/21/2019   IR RADIOLOGIST EVAL & MGMT  11/08/2019   IR RADIOLOGIST EVAL & MGMT  07/01/2020   IR RADIOLOGIST EVAL & MGMT  12/18/2020   IR RADIOLOGIST EVAL & MGMT  06/18/2021   IR US GUIDE VASC ACCESS RIGHT  07/26/2017   IR US GUIDE VASC ACCESS RIGHT  08/11/2017   IR US GUIDE VASC ACCESS RIGHT  04/28/2018   LUNG LOBECTOMY  06/10/13   upper left lung   MULTIPLE TOOTH EXTRACTIONS     REMOVAL RETAINED LENS Right  11/17/2015   Procedure: REMOVAL RETAINED LENS FROAGMENTS RIGHT EYE;  Surgeon: [name], MD;  Location: [place];  Service: Ophthalmology;  Laterality: Right;   TONSILLECTOMY     UPPER GASTROINTESTINAL ENDOSCOPY  11-03-15   Dr [name]   VASECTOMY     VIDEO ASSISTED THORACOSCOPY (VATS)/WEDGE RESECTION Left 06/04/2013   Procedure: VIDEO ASSISTED THORACOSCOPY (VATS)/WEDGE RESECTION;  Surgeon: [name], MD;  Location: [place];  Service: Thoracic;  Laterality: Left;   VIDEO BRONCHOSCOPY N/A 06/04/2013   Procedure: VIDEO BRONCHOSCOPY;  Surgeon: [name], MD;  Location: [place];  Service: Thoracic;  Laterality: N/A;    Family History  Problem Relation Age of Onset   Stroke Mother    Alzheimer's disease Mother    Hypertension Sister    Hyperlipidemia Sister    Hypertension Brother    Hyperlipidemia Brother    Diabetes Brother     Social History   Socioeconomic History   Marital status: Married    Spouse name: [name]   Number of  children: 3   Years of education: Not on file   Highest education level: High school graduate  Occupational History   Not on file  Tobacco Use   Smoking status: Former    Packs/day: 1.00    Years: 40.00    Pack years: 40.00    Types: Cigarettes    Quit date: 12/09/1998    Years since quitting: 22.6   Smokeless tobacco: Never  Vaping Use   Vaping Use: Never used  Substance and Sexual Activity   Alcohol use: No   Drug use: No   Sexual activity: Yes    Partners: Female  Other Topics Concern   Not on file  Social History Narrative   Not on file   Social Determinants of Health   Financial Resource Strain: Not on file  Food Insecurity: Not on file  Transportation Needs: Not on file  Physical Activity: Not on file  Stress: Not on file  Social Connections: Not on file  Intimate Partner Violence: Not on file    Outpatient Medications Prior to Visit  Medication Sig Dispense Refill   amLODipine (NORVASC) 2.5 MG  tablet Take 1 tablet (2.5 mg total) by mouth daily. 30 tablet 1   aspirin EC 81 MG tablet Take 81 mg by mouth daily.      atorvastatin (LIPITOR) 40 MG tablet Take 1 tablet (40 mg total) by mouth daily. for cholesterol. 90 tablet 3   b complex vitamins capsule Take 1 capsule by mouth daily.     benzonatate (TESSALON PERLES) 100 MG capsule Take 1 capsule (100 mg total) by mouth 3 (three) times daily as needed for cough. 30 capsule 0   cholecalciferol (VITAMIN D3) 25 MCG (1000 UNIT) tablet Take 1,000 Units by mouth daily.     Cinnamon 500 MG capsule Take 500 mg by mouth at bedtime.      diphenhydrAMINE (BENADRYL) 25 mg capsule Take 25 mg by mouth every morning.      DM-APAP-CPM (CORICIDIN HBP PO) Take 2 tablets by mouth daily as needed (allergies/cold symptoms).     glucose blood (ONETOUCH VERIO) test strip USE 1 STRIP TO CHECK GLUCOSE ONCE DAILY 100 each 3   ipratropium (ATROVENT) 0.03 % nasal spray Place 2 sprays into both nostrils 3 (three) times daily. 30 mL 3   losartan (COZAAR) 100 MG tablet Take 1 tablet (100 mg total) by mouth daily. 90 tablet 3   Multiple Vitamins-Minerals (MULTIVITAMIN WITH MINERALS) tablet Take 1 tablet by mouth daily.      polycarbophil (FIBERCON) 625 MG tablet Take 625 mg by mouth 2 (two) times daily.      polyvinyl alcohol (LIQUIFILM TEARS) 1.4 % ophthalmic solution Place 1 drop into both eyes as needed for dry eyes.     vitamin C (ASCORBIC ACID) 500 MG tablet Take 500 mg by mouth daily.      zinc sulfate 220 (50 Zn) MG capsule Take 1 capsule (220 mg total) by mouth daily. 30 capsule 0   Facility-Administered Medications Prior to Visit  Medication Dose Route Frequency Provider Last Rate Last Admin   lanreotide acetate (SOMATULINE DEPOT) injection 120 mg  120 mg Subcutaneous Once Lloyd Huger, MD        Allergies  Allergen Reactions   Tape Itching    Surgical tapes     ROS Review of Systems  Constitutional:  Negative for chills and fever.  HENT:   Positive for congestion, postnasal drip and rhinorrhea. Negative for sinus pressure, sinus pain  and sore throat.   Eyes:  Negative for visual disturbance.  Respiratory:  Negative for cough, shortness of breath and wheezing.   Neurological:  Positive for headaches. Negative for dizziness, weakness and light-headedness.     Objective:    Physical Exam Constitutional:      Appearance: Normal appearance.  HENT:     Head: Normocephalic and atraumatic.     Nose: Congestion present.     Mouth/Throat:     Mouth: Mucous membranes are moist.     Comments: PND present Eyes:     Conjunctiva/sclera: Conjunctivae normal.  Cardiovascular:     Rate and Rhythm: Normal rate and regular rhythm.  Pulmonary:     Effort: Pulmonary effort is normal.     Breath sounds: Normal breath sounds.  Musculoskeletal:     Right lower leg: No edema.     Left lower leg: No edema.  Skin:    General: Skin is warm and dry.  Neurological:     General: No focal deficit present.     Mental Status: He is alert. Mental status is at baseline.  Psychiatric:        Mood and Affect: Mood normal.        Behavior: Behavior normal.    BP (!) 160/82    Pulse 66    Temp 98 F (36.7 C)    Resp 16    Ht _0  (1.626 m)    Wt 159 lb 8 oz (72.3 kg)    SpO2 97%    BMI 27.38 kg/m  Wt Readings from Last 3 Encounters:  06/27/21 156 lb (70.8 kg)  06/23/21 156 lb (70.8 kg)  06/18/21 161 lb 11.2 oz (73.3 kg)     Health Maintenance Due  Topic Date Due   OPHTHALMOLOGY EXAM  Never done   Hepatitis C Screening  Never done   Zoster Vaccines- Shingrix (1 of 2) Never done   COVID-19 Vaccine (4 - Booster for Pfizer series) 06/09/2020   URINE MICROALBUMIN  05/23/2021   HEMOGLOBIN A1C  06/17/2021    There are no preventive care reminders to display for this patient.  Lab Results  Component Value Date   TSH 4.221 06/08/2013   Lab Results  Component Value Date   WBC 9.6 07/14/2021   HGB 15.5 07/14/2021   HCT 45.8 07/14/2021    MCV 91.1 07/14/2021   PLT 246 07/14/2021   Lab Results  Component Value Date   NA 138 07/14/2021   K 4.4 07/14/2021   CO2 28 07/14/2021   GLUCOSE 112 (H) 07/14/2021   BUN 21 07/14/2021   CREATININE 1.08 07/14/2021   BILITOT 0.9 07/14/2021   ALKPHOS 112 07/14/2021   AST 25 07/14/2021   ALT 27 07/14/2021   PROT 7.0 07/14/2021   ALBUMIN 4.1 07/14/2021   CALCIUM 9.6 07/14/2021   ANIONGAP 7 07/14/2021   Lab Results  Component Value Date   CHOL 83 06/15/2019   Lab Results  Component Value Date   HDL 32 (L) 06/15/2019   Lab Results  Component Value Date   LDLCALC 42 06/15/2019   Lab Results  Component Value Date   TRIG 44 06/15/2019   Lab Results  Component Value Date   CHOLHDL 2.6 06/15/2019   Lab Results  Component Value Date   HGBA1C 6.4 (A) 12/15/2020      Assessment & Plan:   Problem List Items Addressed This Visit       Cardiovascular and Mediastinum   Essential  hypertension - Primary    Continue Losartan 100, increase Amlodipine to 5 mg BID, continue to monitor closely and can start taking once daily if blood pressure becomes too low. Follow up here in 1 month for recheck.      Relevant Medications   amLODipine (NORVASC) 5 MG tablet     Other   History of COVID-19 - recovering well.     Meds ordered this encounter  Medications   amLODipine (NORVASC) 5 MG tablet    Sig: Take 1 tablet (5 mg total) by mouth 2 (two) times daily.    Dispense:  90 tablet    Refill:  3    Follow-up: Return in about 4 weeks (around 08/12/2021).    Teodora Medici, DO

## 2021-07-27 LAB — HM DIABETES EYE EXAM

## 2021-08-04 ENCOUNTER — Ambulatory Visit (INDEPENDENT_AMBULATORY_CARE_PROVIDER_SITE_OTHER): Payer: Medicare Other | Admitting: Pulmonary Disease

## 2021-08-04 ENCOUNTER — Telehealth: Payer: Self-pay

## 2021-08-04 ENCOUNTER — Encounter: Payer: Self-pay | Admitting: Pulmonary Disease

## 2021-08-04 ENCOUNTER — Other Ambulatory Visit: Payer: Self-pay

## 2021-08-04 VITALS — BP 120/60 | HR 64 | Temp 97.7°F | Ht 64.0 in | Wt 161.0 lb

## 2021-08-04 DIAGNOSIS — J8410 Pulmonary fibrosis, unspecified: Secondary | ICD-10-CM | POA: Diagnosis not present

## 2021-08-04 DIAGNOSIS — C7A8 Other malignant neuroendocrine tumors: Secondary | ICD-10-CM

## 2021-08-04 DIAGNOSIS — Z8616 Personal history of COVID-19: Secondary | ICD-10-CM

## 2021-08-04 NOTE — Patient Instructions (Signed)
We have scheduled your procedure for 5 April. ? ?We will see you in follow-up in 4-6 weeks time. ?

## 2021-08-04 NOTE — Telephone Encounter (Signed)
Regular bronchoscopy with cryo therapy and tumor debulking 06/29/2021 at 12:30. ?[id number], [id number], [id number] ?AS:NKNL mass ?  ?Rodena Piety, please see bronch info. Thanks ?

## 2021-08-04 NOTE — Telephone Encounter (Signed)
Someone has to place an ENT referral before we can process ?

## 2021-08-04 NOTE — H&P (View-Only) (Signed)
? ?Subjective:  ? ? Patient ID: Jason Malone, male    DOB: 1940-09-07, 81 y.o.   MRN: [id number] ?Patient Care Team: ?Teodora Medici, DO as PCP - General (Internal Medicine) ?Minna Merritts, MD as PCP - Cardiology (Cardiology) ?[name], MD (General Surgery) ?Nestor Lewandowsky, MD (Inactive) as Referring Physician (Cardiothoracic Surgery) ?Minna Merritts, MD as Consulting Physician (Cardiology) ?[name], MD (Inactive) as Consulting Physician (Cardiothoracic Surgery) ?Lloyd Huger, MD as Consulting Physician (Oncology) ?Arnetha Courser, MD as Attending Physician (Family Medicine) ? ?Chief Complaint  ?Patient presents with  ? Follow-up  ?  Reschedule bronchoscopy post COVID  ? 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  His last visit here was on 17 June 2021, this is a scheduled visit.  Recall that the patient underwent resection of his carcinoid in 2015 however, recurrence was noted in August 2018 when liver involvement was noted. In January 2021 he had a severe case of COVID-19 and was admitted to the [facility].  CT scan performed on 11 June 2019 showed typical parenchymal involvement of COVID-19 on the right lung.  The left lung had a lobulated mass in the left mainstem bronchus that appeared to be arising from the suture line from prior resection.  He has undergone cryoablation to this area on 3 separate occasions namely 19 Sep 2019, 10 October 2019 and finally on 26 November 2019.  On these occasions the mass was debulked and significantly reduced in size.  Minimal residual was noted at the last session.  Biopsies were consistent with neuroendocrine carcinoma (carcinoid).  He had a CT chest abdomen and pelvis performed on 12 December 2020 that showed that he may have recurrence at this suture line.  However at the time it  was not obstructing the bronchus and the patient had not noticed any significant shortness of breath over his baseline.  He underwent chest abdomen and pelvis CT with contrast on 3 February and the nodule or soft tissue of the anterior aspect of the left mainstem bronchus has continued to increase and is now 1 cm x 0.7 mm.  He was scheduled for bronchoscopy on 20 February at 12:30 PM however, patient had another episode of COVID on 17 February and this had to be postponed.  Was treated with Paxlovid.  He was only able to take 3 days worth due to poor tolerance of the medication.  Since his last visit he has progressively noted shortness of breath even before COVID.  This is likely related to the obstructive nature of the lesion on his left mainstem bronchus.  He has not had any fevers chills or sweats since his most recent bout with COVID.  He has had no chest pain.  No lower extremity edema.  No orthopnea or paroxysmal nocturnal dyspnea.  Does not endorse any other symptoms. ? ? ?Review of Systems ?A 10 point review of systems was performed and it is as noted above otherwise negative. ? ?Patient Active Problem List  ? Diagnosis Date Noted  ? History of COVID-19 05/12/2020  ? Chronic respiratory failure with hypoxia ([place]) 05/12/2020  ? Postinflammatory pulmonary fibrosis ([place]) 05/12/2020  ? COPD mixed type ([place]) 05/12/2020  ? Decreased hearing 01/23/2020  ? Hyponatremia 07/03/2019  ? Metabolic acidosis 54/65/0354  ? BPH (benign prostatic hyperplasia) 07/03/2019  ?  PNA (pneumonia) 07/03/2019  ? Diabetes mellitus type 2, controlled, with complications ([place]) 34/19/3790  ? HLD (hyperlipidemia) 06/14/2019  ? 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 Respiratory failure ([place]) 06/11/2019  ? Elevated LFTs 06/11/2019  ? Pneumonia due to COVID-19 virus 06/10/2019  ? Contracture of palmar fascia 10/11/2018  ? Microalbuminuria due to type 2 diabetes mellitus ([place]) 04/12/2018  ? Type 2 diabetes mellitus ([place]) 04/10/2018  ? Osteopenia  determined by x-ray 01/07/2017  ? Neuroendocrine carcinoma of lung ([place]) 12/28/2016  ? Low back pain 12/17/2016  ? Liver mass, left lobe 08/28/2016  ? Aortic atherosclerosis ([place]) 04/06/2016  ? Coronary artery disease involving native heart without angina pectoris 04/06/2016  ? History of colonic polyps 10/28/2015  ? Medication monitoring encounter 08/15/2015  ? Paroxysmal atrial flutter ([place])   ? Elevated serum alkaline phosphatase level 03/05/2015  ? Allergic rhinitis   ? BPH without urinary obstruction   ? Hyperlipidemia 02/13/2014  ? Orthostasis 07/18/2013  ? Sinus bradycardia 07/18/2013  ? S/P lobectomy of lung 06/04/2013  ? Atrial flutter ([place]) 04/09/2013  ? Smoking history 04/09/2013  ? Essential hypertension 04/09/2013  ? Rectal bleeding 12/15/2012  ? ?Social History  ? ?Tobacco Use  ? Smoking status: Former  ?  Packs/day: 1.00  ?  Years: 40.00  ?  Pack years: 40.00  ?  Types: Cigarettes  ?  Quit date: 12/09/1998  ?  Years since quitting: 22.6  ? Smokeless tobacco: Never  ?Substance Use Topics  ? Alcohol use: No  ? ?Allergies  ?Allergen Reactions  ? Tape Itching  ?  Surgical tapes ?  ? ?Current Meds  ?Medication Sig  ? amLODipine (NORVASC) 5 MG tablet Take 1 tablet (5 mg total) by mouth 2 (two) times daily.  ? aspirin EC 81 MG tablet Take 81 mg by mouth daily.   ? atorvastatin (LIPITOR) 40 MG tablet Take 1 tablet (40 mg total) by mouth daily. for cholesterol.  ? b complex vitamins capsule Take 1 capsule by mouth daily.  ? cholecalciferol (VITAMIN D3) 25 MCG (1000 UNIT) tablet Take 1,000 Units by mouth daily.  ? Cinnamon 500 MG capsule Take 500 mg by mouth at bedtime.   ? diphenhydrAMINE (BENADRYL) 25 mg capsule Take 25 mg by mouth every morning.   ? DM-APAP-CPM (CORICIDIN HBP PO) Take 2 tablets by mouth daily as needed (allergies/cold symptoms).  ? glucose blood (ONETOUCH VERIO) test strip USE 1 STRIP TO CHECK GLUCOSE ONCE DAILY  ? ipratropium (ATROVENT) 0.03 % nasal spray Place 2 sprays into both nostrils 3  (three) times daily.  ? Multiple Vitamins-Minerals (MULTIVITAMIN WITH MINERALS) tablet Take 1 tablet by mouth daily.   ? polycarbophil (FIBERCON) 625 MG tablet Take 625 mg by mouth 2 (two) times daily.   ? polyvinyl alcohol (LIQUIFILM TEARS) 1.4 % ophthalmic solution Place 1 drop into both eyes as needed for dry eyes.  ? vitamin C (ASCORBIC ACID) 500 MG tablet Take 500 mg by mouth daily.   ? zinc sulfate 220 (50 Zn) MG capsule Take 1 capsule (220 mg total) by mouth daily.  ? [DISCONTINUED] benzonatate (TESSALON PERLES) 100 MG capsule Take 1 capsule (100 mg total) by mouth 3 (three) times daily as needed for cough.  ? ?Immunization History  ?Administered Date(s) Administered  ? Fluad Quad(high Dose 65+) 01/24/2019, 01/23/2020, 01/30/2021  ? Influenza, High Dose Seasonal PF 02/12/2016, 02/14/2017, 01/17/2018  ? Influenza,inj,Quad PF,6+ Mos 02/14/2015  ? PFIZER(Purple Top)SARS-COV-2 Vaccination 05/24/2019, 10/02/2019, 04/14/2020  ? Pneumococcal Conjugate-13 02/01/2014  ?  Pneumococcal Polysaccharide-23 05/10/2008  ? ? ?   ?Objective:  ? Physical Exam ?BP 120/60 (BP Location: Left Arm, Patient Position: Sitting, Cuff Size: Normal)   Pulse 64   Temp 97.7 ?F (36.5 ?C) (Oral)   Ht 5\' 4"  (1.626 m)   Wt 161 lb (73 kg)   SpO2 96%   BMI 27.64 kg/m?  ?GENERAL: Awake and alert, well developed, well nourished gentleman, in no respiratory distress, fully ambulatory, no conversational dyspnea. ?HEAD: Normocephalic, atraumatic. ?EYES: Pupils equal, round, reactive to light.  No scleral icterus. ?MOUTH: Nose/mouth/throat not examined due to masking requirements for COVID 19. ?NECK: Supple. No thyromegaly. No nodules. No JVD.  Trachea is midline ?PULMONARY:  Symmetrical and good air entry, slightly diminished breath sounds in the left upper lung zone, no wheezes or rhonchi noted. ?CARDIOVASCULAR: S1 and S2.    Regular rate and rhythm.  No rubs murmurs or gallops appreciated. ?GASTROINTESTINAL: Benign. ?MUSCULOSKELETAL: No joint  deformity, no clubbing, no edema. ?NEUROLOGIC: No focal deficit, no gait disturbance, speech is fluent. ?SKIN: Intact,warm,dry. No rashes noted on limited exam. ?PSYCH: Mood and behavior normal. ? ?Recent Results (fr

## 2021-08-04 NOTE — Progress Notes (Signed)
? ?Subjective:  ? ? Patient ID: Jason Malone, male    DOB: 12-13-1940, 81 y.o.   MRN: [id number] ?Patient Care Team: ?Teodora Medici, DO as PCP - General (Internal Medicine) ?Minna Merritts, MD as PCP - Cardiology (Cardiology) ?[name], MD (General Surgery) ?Nestor Lewandowsky, MD (Inactive) as Referring Physician (Cardiothoracic Surgery) ?Minna Merritts, MD as Consulting Physician (Cardiology) ?[name], MD (Inactive) as Consulting Physician (Cardiothoracic Surgery) ?Lloyd Huger, MD as Consulting Physician (Oncology) ?Arnetha Courser, MD as Attending Physician (Family Medicine) ? ?Chief Complaint  ?Patient presents with  ? Follow-up  ?  Reschedule bronchoscopy post COVID  ? 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  His last visit here was on 17 June 2021, this is a scheduled visit.  Recall that the patient underwent resection of his carcinoid in 2015 however, recurrence was noted in August 2018 when liver involvement was noted. In January 2021 he had a severe case of COVID-19 and was admitted to the [facility].  CT scan performed on 11 June 2019 showed typical parenchymal involvement of COVID-19 on the right lung.  The left lung had a lobulated mass in the left mainstem bronchus that appeared to be arising from the suture line from prior resection.  He has undergone cryoablation to this area on 3 separate occasions namely 19 Sep 2019, 10 October 2019 and finally on 26 November 2019.  On these occasions the mass was debulked and significantly reduced in size.  Minimal residual was noted at the last session.  Biopsies were consistent with neuroendocrine carcinoma (carcinoid).  He had a CT chest abdomen and pelvis performed on 12 December 2020 that showed that he may have recurrence at this suture line.  However at the time it  was not obstructing the bronchus and the patient had not noticed any significant shortness of breath over his baseline.  He underwent chest abdomen and pelvis CT with contrast on 3 February and the nodule or soft tissue of the anterior aspect of the left mainstem bronchus has continued to increase and is now 1 cm x 0.7 mm.  He was scheduled for bronchoscopy on 20 February at 12:30 PM however, patient had another episode of COVID on 17 February and this had to be postponed.  Was treated with Paxlovid.  He was only able to take 3 days worth due to poor tolerance of the medication.  Since his last visit he has progressively noted shortness of breath even before COVID.  This is likely related to the obstructive nature of the lesion on his left mainstem bronchus.  He has not had any fevers chills or sweats since his most recent bout with COVID.  He has had no chest pain.  No lower extremity edema.  No orthopnea or paroxysmal nocturnal dyspnea.  Does not endorse any other symptoms. ? ? ?Review of Systems ?A 10 point review of systems was performed and it is as noted above otherwise negative. ? ?Patient Active Problem List  ? Diagnosis Date Noted  ? History of COVID-19 05/12/2020  ? Chronic respiratory failure with hypoxia ([place]) 05/12/2020  ? Postinflammatory pulmonary fibrosis ([place]) 05/12/2020  ? COPD mixed type ([place]) 05/12/2020  ? Decreased hearing 01/23/2020  ? Hyponatremia 07/03/2019  ? Metabolic acidosis 77/82/4235  ? BPH (benign prostatic hyperplasia) 07/03/2019  ?  PNA (pneumonia) 07/03/2019  ? Diabetes mellitus type 2, controlled, with complications ([place]) 41/28/7867  ? HLD (hyperlipidemia) 06/14/2019  ? 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 Respiratory failure ([place]) 06/11/2019  ? Elevated LFTs 06/11/2019  ? Pneumonia due to COVID-19 virus 06/10/2019  ? Contracture of palmar fascia 10/11/2018  ? Microalbuminuria due to type 2 diabetes mellitus ([place]) 04/12/2018  ? Type 2 diabetes mellitus ([place]) 04/10/2018  ? Osteopenia  determined by x-ray 01/07/2017  ? Neuroendocrine carcinoma of lung ([place]) 12/28/2016  ? Low back pain 12/17/2016  ? Liver mass, left lobe 08/28/2016  ? Aortic atherosclerosis ([place]) 04/06/2016  ? Coronary artery disease involving native heart without angina pectoris 04/06/2016  ? History of colonic polyps 10/28/2015  ? Medication monitoring encounter 08/15/2015  ? Paroxysmal atrial flutter ([place])   ? Elevated serum alkaline phosphatase level 03/05/2015  ? Allergic rhinitis   ? BPH without urinary obstruction   ? Hyperlipidemia 02/13/2014  ? Orthostasis 07/18/2013  ? Sinus bradycardia 07/18/2013  ? S/P lobectomy of lung 06/04/2013  ? Atrial flutter ([place]) 04/09/2013  ? Smoking history 04/09/2013  ? Essential hypertension 04/09/2013  ? Rectal bleeding 12/15/2012  ? ?Social History  ? ?Tobacco Use  ? Smoking status: Former  ?  Packs/day: 1.00  ?  Years: 40.00  ?  Pack years: 40.00  ?  Types: Cigarettes  ?  Quit date: 12/09/1998  ?  Years since quitting: 22.6  ? Smokeless tobacco: Never  ?Substance Use Topics  ? Alcohol use: No  ? ?Allergies  ?Allergen Reactions  ? Tape Itching  ?  Surgical tapes ?  ? ?Current Meds  ?Medication Sig  ? amLODipine (NORVASC) 5 MG tablet Take 1 tablet (5 mg total) by mouth 2 (two) times daily.  ? aspirin EC 81 MG tablet Take 81 mg by mouth daily.   ? atorvastatin (LIPITOR) 40 MG tablet Take 1 tablet (40 mg total) by mouth daily. for cholesterol.  ? b complex vitamins capsule Take 1 capsule by mouth daily.  ? cholecalciferol (VITAMIN D3) 25 MCG (1000 UNIT) tablet Take 1,000 Units by mouth daily.  ? Cinnamon 500 MG capsule Take 500 mg by mouth at bedtime.   ? diphenhydrAMINE (BENADRYL) 25 mg capsule Take 25 mg by mouth every morning.   ? DM-APAP-CPM (CORICIDIN HBP PO) Take 2 tablets by mouth daily as needed (allergies/cold symptoms).  ? glucose blood (ONETOUCH VERIO) test strip USE 1 STRIP TO CHECK GLUCOSE ONCE DAILY  ? ipratropium (ATROVENT) 0.03 % nasal spray Place 2 sprays into both nostrils 3  (three) times daily.  ? Multiple Vitamins-Minerals (MULTIVITAMIN WITH MINERALS) tablet Take 1 tablet by mouth daily.   ? polycarbophil (FIBERCON) 625 MG tablet Take 625 mg by mouth 2 (two) times daily.   ? polyvinyl alcohol (LIQUIFILM TEARS) 1.4 % ophthalmic solution Place 1 drop into both eyes as needed for dry eyes.  ? vitamin C (ASCORBIC ACID) 500 MG tablet Take 500 mg by mouth daily.   ? zinc sulfate 220 (50 Zn) MG capsule Take 1 capsule (220 mg total) by mouth daily.  ? [DISCONTINUED] benzonatate (TESSALON PERLES) 100 MG capsule Take 1 capsule (100 mg total) by mouth 3 (three) times daily as needed for cough.  ? ?Immunization History  ?Administered Date(s) Administered  ? Fluad Quad(high Dose 65+) 01/24/2019, 01/23/2020, 01/30/2021  ? Influenza, High Dose Seasonal PF 02/12/2016, 02/14/2017, 01/17/2018  ? Influenza,inj,Quad PF,6+ Mos 02/14/2015  ? PFIZER(Purple Top)SARS-COV-2 Vaccination 05/24/2019, 10/02/2019, 04/14/2020  ? Pneumococcal Conjugate-13 02/01/2014  ?  Pneumococcal Polysaccharide-23 05/10/2008  ? ? ?   ?Objective:  ? Physical Exam ?BP 120/60 (BP Location: Left Arm, Patient Position: Sitting, Cuff Size: Normal)   Pulse 64   Temp 97.7 ?F (36.5 ?C) (Oral)   Ht 5\' 4"  (1.626 m)   Wt 161 lb (73 kg)   SpO2 96%   BMI 27.64 kg/m?  ?GENERAL: Awake and alert, well developed, well nourished gentleman, in no respiratory distress, fully ambulatory, no conversational dyspnea. ?HEAD: Normocephalic, atraumatic. ?EYES: Pupils equal, round, reactive to light.  No scleral icterus. ?MOUTH: Nose/mouth/throat not examined due to masking requirements for COVID 19. ?NECK: Supple. No thyromegaly. No nodules. No JVD.  Trachea is midline ?PULMONARY:  Symmetrical and good air entry, slightly diminished breath sounds in the left upper lung zone, no wheezes or rhonchi noted. ?CARDIOVASCULAR: S1 and S2.    Regular rate and rhythm.  No rubs murmurs or gallops appreciated. ?GASTROINTESTINAL: Benign. ?MUSCULOSKELETAL: No joint  deformity, no clubbing, no edema. ?NEUROLOGIC: No focal deficit, no gait disturbance, speech is fluent. ?SKIN: Intact,warm,dry. No rashes noted on limited exam. ?PSYCH: Mood and behavior normal. ? ?Recent Results (fr

## 2021-08-05 NOTE — Telephone Encounter (Signed)
For the codes 31628, [phone], [phone] Prior Auth Not Required  ?Refer #   [id number] ?

## 2021-08-05 NOTE — Telephone Encounter (Signed)
Patient aware of upcoming pre-admit testing via telephone is 08/06/21 at 2:45pm. [facility] 08/10/2021 @9 :30 am. Nothing further needed.  ?

## 2021-08-05 NOTE — Telephone Encounter (Signed)
Sorry that was my message I clicked on the wrong patient and documented ?

## 2021-08-05 NOTE — Telephone Encounter (Signed)
I do not see where Dr. Patsey Berthold wanted ENT referral.  ? ?Rodena Piety, please advise if this message was created in error? Thanks ?

## 2021-08-06 ENCOUNTER — Other Ambulatory Visit: Payer: Self-pay

## 2021-08-06 ENCOUNTER — Encounter
Admission: RE | Admit: 2021-08-06 | Discharge: 2021-08-06 | Disposition: A | Discharge status: Home / Self Care | Payer: Medicare Other | Source: Ambulatory Visit | Attending: Pulmonary Disease | Admitting: Pulmonary Disease

## 2021-08-06 HISTORY — DX: Prediabetes: R73.03

## 2021-08-06 NOTE — Patient Instructions (Signed)
Your procedure is scheduled on: 08/12/21 Report to [facility]. To find out your arrival time please call [phone] between 1PM - 3PM on 08/11/21.  Remember: Instructions that are not followed completely may result in serious medical risk, up to and including death, or upon the discretion of your surgeon and anesthesiologist your surgery may need to be rescheduled.     _X__ 1. Do not eat food or drink any liquids after midnight the night before your procedure.                 No gum chewing or hard candies.   __X__2.  On the morning of surgery brush your teeth with toothpaste and water, you                 may rinse your mouth with mouthwash if you wish.  Do not swallow any              toothpaste of mouthwash.     _X__ 3.  No Alcohol for 24 hours before or after surgery.   _X__ 4.  Do Not Smoke or use e-cigarettes For 24 Hours Prior to Your Surgery.                 Do not use any chewable tobacco products for at least 6 hours prior to                 surgery.  ____  5.  Bring all medications with you on the day of surgery if instructed.   __X__  6.  Notify your doctor if there is any change in your medical condition      (cold, fever, infections).     Do not wear jewelry, make-up, hairpins, clips or nail polish. Do not wear lotions, powders, or perfumes.  Do not shave 48 hours prior to surgery. Men may shave face and neck. Do not bring valuables to the hospital.    [facility] is not responsible for any belongings or valuables.  Contacts, dentures/partials or body piercings may not be worn into surgery. Bring a case for your contacts, glasses or hearing aids, a denture cup will be supplied. Leave your suitcase in the car. After surgery it may be brought to your room. For patients admitted to the hospital, discharge time is determined by your treatment team.   Patients discharged the day of surgery will not be allowed to drive  home.   Please read over the following fact sheets that you were given:     __X__ Take these medicines the morning of surgery with A SIP OF WATER:    1. amLODipine (NORVASC) 7.5 MG tablet  2.   3.   4.  5.  6.  ____ Fleet Enema (as directed)   ____ Use CHG Soap/SAGE wipes as directed  ____ Use inhalers on the day of surgery  ____ Stop metformin/Janumet/Farxiga 2 days prior to surgery    ____ Take 1/2 of usual insulin dose the night before surgery. No insulin the morning          of surgery.   ____ Stop Blood Thinners Coumadin/Plavix/Xarelto/Pleta/Pradaxa/Eliquis/Effient/Aspirin  on   Or contact your Surgeon, Cardiologist or Medical Doctor regarding  ability to stop your blood thinners  __X__ Stop Anti-inflammatories 7 days before surgery such as Advil, Ibuprofen, Motrin,  BC or Goodies Powder, Naprosyn, Naproxen, Aleve, Aspirin    __X__ Stop all herbals and supplements, fish  oil or vitamins  until after surgery.  You may take your fiber  Stop Vitamin C, Zinc, Multi Vitamin, Cinnamon and Vitamin D  ____ Bring C-Pap to the hospital.    You may use your nasal spray the morning of surgery.

## 2021-08-07 ENCOUNTER — Other Ambulatory Visit: Payer: Self-pay | Admitting: Internal Medicine

## 2021-08-07 DIAGNOSIS — E119 Type 2 diabetes mellitus without complications: Secondary | ICD-10-CM

## 2021-08-07 MED ORDER — ONETOUCH VERIO VI STRP: ORAL_STRIP | 3 refills | Status: DC

## 2021-08-07 NOTE — Telephone Encounter (Signed)
Medication Refill - Medication: glucose blood (ONETOUCH VERIO) test strip ? ?Has the patient contacted their pharmacy? No. ?It has been over a year since he had to order,. ? ?Preferred Pharmacy (with phone number or street name): [facility], [place] ? ?Has the patient been seen for an appointment in the last year OR does the patient have an upcoming appointment? Yes.   ? ?Agent: Please be advised that RX refills may take up to 3 business days. We ask that you follow-up with your pharmacy. ? ?

## 2021-08-07 NOTE — Telephone Encounter (Signed)
Requested medication (s) are due for refill today - expired Rx ? ?Requested medication (s) are on the active medication list -yes ? ?Future visit scheduled -yes ? ?Last refill: 07/07/20 #100 3RF ? ?Notes to clinic: Request [id number] Rx ? ?Requested Prescriptions  ?Pending Prescriptions Disp Refills  ? glucose blood (ONETOUCH VERIO) test strip 100 each 3  ?  Sig: USE 1 STRIP TO CHECK GLUCOSE ONCE DAILY  ?  ? Endocrinology: Diabetes - Testing Supplies Passed - 08/07/2021 12:07 PM  ?  ?  Passed - Valid encounter within last 12 months  ?  Recent Outpatient Visits   ? ?      ? 3 weeks ago Essential hypertension  ? [facility] Teodora Medici, DO  ? 1 month ago Type 2 diabetes mellitus without complication, without long-term current use of insulin ([place])  ? [facility] Teodora Medici, DO  ? 7 months ago Type 2 diabetes mellitus without complication, without long-term current use of insulin ([place])  ? [place], DO  ? 1 year ago Essential hypertension  ? [facility] Lebron Conners D, MD  ? 1 year ago Need for influenza vaccination  ? [facility] Towanda Malkin, MD  ? ?  ?  ?Future Appointments   ? ?        ? In 1 month Teodora Medici, [facility] [place]  ? ?  ? ?  ?  ?  ? ? ? ?Requested Prescriptions  ?Pending Prescriptions Disp Refills  ? glucose blood (ONETOUCH VERIO) test strip 100 each 3  ?  Sig: USE 1 STRIP TO CHECK GLUCOSE ONCE DAILY  ?  ? Endocrinology: Diabetes - Testing Supplies Passed - 08/07/2021 12:07 PM  ?  ?  Passed - Valid encounter within last 12 months  ?  Recent Outpatient Visits   ? ?      ? 3 weeks ago Essential hypertension  ? [facility] Teodora Medici, DO  ? 1 month ago Type 2 diabetes mellitus without complication, without long-term current use of insulin ([place])  ? [facility] Teodora Medici,  DO  ? 7 months ago Type 2 diabetes mellitus without complication, without long-term current use of insulin ([place])  ? [place], DO  ? 1 year ago Essential hypertension  ? [facility] Lebron Conners D, MD  ? 1 year ago Need for influenza vaccination  ? [facility] Towanda Malkin, MD  ? ?  ?  ?Future Appointments   ? ?        ? In 1 month Teodora Medici, [facility] [place]  ? ?  ? ?  ?  ?  ? ? ? ?

## 2021-08-09 NOTE — Progress Notes (Signed)
?[place]  ?[phone] [phone] ? ?ID: Jason Malone OB: 01/20/1941  MR#: [id number]  EHO#:[id number] ? ?Patient Care Team: ?Teodora Medici, DO as PCP - General (Internal Medicine) ?Minna Merritts, MD as PCP - Cardiology (Cardiology) ?[name], MD (General Surgery) ?Nestor Lewandowsky, MD (Inactive) as Referring Physician (Cardiothoracic Surgery) ?Minna Merritts, MD as Consulting Physician (Cardiology) ?[name], MD (Inactive) as Consulting Physician (Cardiothoracic Surgery) ?Lloyd Huger, MD as Consulting Physician (Oncology) ?Arnetha Courser, MD as Attending Physician (Family Medicine) ? ? ?CHIEF COMPLAINT: 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 ? ?INTERVAL HISTORY: Patient returns to clinic today for further evaluation and continuation of lanreotide.  His cryoablation was postponed from last month secondary to COVID infection.  He has not fully recovered and back to his baseline.  His procedure is scheduled for tomorrow.  He currently feels well and is asymptomatic.  He continues to have chronic cough. He denies any pain. He has no neurologic complaints.  He denies any recent fevers or illnesses.  He has a good appetite and denies weight loss.  He denies any chest pain, shortness of breath, or hemoptysis.  He denies any abdominal pain.  He has no nausea, vomiting, constipation, or diarrhea.  He has no urinary complaints.  Patient offers no specific complaints today. ? ?REVIEW OF SYSTEMS:   ?Review of Systems  ?Constitutional: Negative.  Negative for fever, malaise/fatigue and weight loss.  ?Respiratory:  Positive for cough. Negative for hemoptysis and shortness of breath.   ?Cardiovascular: Negative.  Negative for chest pain and leg swelling.  ?Gastrointestinal: Negative.  Negative for abdominal pain, diarrhea, nausea and vomiting.  ?Genitourinary: Negative.  Negative for dysuria.  ?Musculoskeletal: Negative.   Negative for back pain.  ?Skin: Negative.  Negative for rash.  ?Neurological: Negative.  Negative for dizziness, sensory change, focal weakness and weakness.  ?Psychiatric/Behavioral: Negative.  The patient is not nervous/anxious.   ? ?As per HPI. Otherwise, a complete review of systems is negative. ? ?PAST MEDICAL HISTORY: ?Past Medical History:  ?Diagnosis Date  ? Allergic rhinitis   ? Benign prostatic hypertrophy with lower urinary tract symptoms (LUTS)   ? Chest pain   ? a. 05/2013 MV: EF 70%, no ischemia.  ? COPD (chronic obstructive pulmonary disease) ([place])   ? Diverticulitis   ? DIVERTICULOSIS  ? Dysrhythmia   ? Enlarged prostate   ? Essential hypertension   ? CONTROLLED ON MEDS  ? Full dentures   ? H/O hypokalemia   ? 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 08/28/2016  ? Hyperlipemia   ? Hypomagnesemia   ? IFG (impaired fasting glucose)   ? Liver cancer ([place]) 12/08/2016  ? Liver mass, left lobe 08/28/2016  ? Probably hemangioma; will get MRI of liver, refer to GI  ? Lung cancer ([place])   ? a. carcinoid, left lung, Stage 1b (T2a, N0, cM0);  b. 05/2013 s/p VATS & LULobectomy.  ? Monocytosis   ? Osteopenia   ? Paroxysmal atrial flutter ([place])   ? a. 03/2013->no recurrence;  b. CHA2DS2VASc = 2-->not currently on anticoagulation;  c. 05/2012 Echo: EF 55-60%, normal RV.  ? Pre-diabetes   ? Wears glasses   ? ? ?PAST SURGICAL HISTORY: ?Past Surgical History:  ?Procedure Laterality Date  ? BRONCHOSCOPY    ? 04/06/2013  ? CARDIOVASCULAR STRESS TEST  05/2013  ? a. No evidence of ischemia or infarct, EF 70%, no WMAs  ?  CATARACT EXTRACTION W/PHACO Left 07/14/2015  ? Procedure: CATARACT EXTRACTION PHACO AND INTRAOCULAR LENS PLACEMENT (IOC);  Surgeon: [name], MD;  Location: [place];  Service: Ophthalmology;  Laterality: Left;  ? CATARACT EXTRACTION W/PHACO Right 10/01/2015  ? Procedure: CATARACT EXTRACTION PHACO AND INTRAOCULAR LENS PLACEMENT (IOC);  Surgeon: [name], MD;  Location:  [place];  Service: Ophthalmology;  Laterality: Right;  ? COLONOSCOPY W/ POLYPECTOMY    ? bleed after-had to go to surgery to stop bleeding via colonoscopy  ? COLONOSCOPY WITH PROPOFOL N/A 11/19/2015  ? Procedure: COLONOSCOPY WITH PROPOFOL;  Surgeon: [name], MD;  Location: [facility] ENDOSCOPY;  Service: Endoscopy;  Laterality: N/A;  ? DUPUYTREN CONTRACTURE RELEASE  12/15/2011  ? Procedure: DUPUYTREN CONTRACTURE RELEASE;  Surgeon: [name], MD;  Location: [place];  Service: Orthopedics;  Laterality: Left;  Fasciotomy left ring finger dupuytrens  ? ELECTROMAGNETIC NAVIGATION BROCHOSCOPY Left 09/19/2019  ? Procedure: ELECTROMAGNETIC NAVIGATION BRONCHOSCOPY;  Surgeon: Tyler Pita, MD;  Location: [facility];  Service: Cardiopulmonary;  Laterality: Left;  ? FASCIECTOMY Right 11/07/2018  ? Procedure: SEGMENTAL FASCIECTOMY RIGHT RING FINGER;  Surgeon: Daryll Brod, MD;  Location: [place];  Service: Orthopedics;  Laterality: Right;  AXILLARY BLOCK  ? FLEXIBLE BRONCHOSCOPY Left 11/26/2019  ? Procedure: FLEXIBLE BRONCHOSCOPY WITH CRYO THERAPY;  Surgeon: Tyler Pita, MD;  Location: [facility];  Service: Cardiopulmonary;  Laterality: Left;  ? IR ANGIOGRAM SELECTIVE EACH ADDITIONAL VESSEL  07/26/2017  ? IR ANGIOGRAM SELECTIVE EACH ADDITIONAL VESSEL  07/26/2017  ? IR ANGIOGRAM SELECTIVE EACH ADDITIONAL VESSEL  07/26/2017  ? IR ANGIOGRAM SELECTIVE EACH ADDITIONAL VESSEL  07/26/2017  ? IR ANGIOGRAM SELECTIVE EACH ADDITIONAL VESSEL  07/26/2017  ? IR ANGIOGRAM SELECTIVE EACH ADDITIONAL VESSEL  08/11/2017  ? IR ANGIOGRAM SELECTIVE EACH ADDITIONAL VESSEL  04/28/2018  ? IR ANGIOGRAM VISCERAL SELECTIVE  07/26/2017  ? IR ANGIOGRAM VISCERAL SELECTIVE  08/11/2017  ? IR ANGIOGRAM VISCERAL SELECTIVE  04/28/2018  ? IR EMBO ARTERIAL NOT HEMORR HEMANG INC GUIDE ROADMAPPING  07/26/2017  ? IR EMBO TUMOR ORGAN ISCHEMIA INFARCT INC GUIDE ROADMAPPING  08/11/2017  ? IR EMBO TUMOR ORGAN ISCHEMIA INFARCT INC  GUIDE ROADMAPPING  04/28/2018  ? IR RADIOLOGIST EVAL & MGMT  07/07/2017  ? IR RADIOLOGIST EVAL & MGMT  09/07/2017  ? IR RADIOLOGIST EVAL & MGMT  12/21/2017  ? IR RADIOLOGIST EVAL & MGMT  03/22/2018  ? IR RADIOLOGIST EVAL & MGMT  06/08/2018  ? IR RADIOLOGIST EVAL & MGMT  11/01/2018  ? IR RADIOLOGIST EVAL & MGMT  03/21/2019  ? IR RADIOLOGIST EVAL & MGMT  11/08/2019  ? IR RADIOLOGIST EVAL & MGMT  07/01/2020  ? IR RADIOLOGIST EVAL & MGMT  12/18/2020  ? IR RADIOLOGIST EVAL & MGMT  06/18/2021  ? IR US GUIDE VASC ACCESS RIGHT  07/26/2017  ? IR US GUIDE VASC ACCESS RIGHT  08/11/2017  ? IR US GUIDE VASC ACCESS RIGHT  04/28/2018  ? LUNG LOBECTOMY  06/10/13  ? upper left lung  ? MULTIPLE TOOTH EXTRACTIONS    ? REMOVAL RETAINED LENS Right 11/17/2015  ? Procedure: REMOVAL RETAINED LENS FROAGMENTS RIGHT EYE;  Surgeon: [name], MD;  Location: [place];  Service: Ophthalmology;  Laterality: Right;  ? TONSILLECTOMY    ? UPPER GASTROINTESTINAL ENDOSCOPY  11-03-15  ? Dr [name]  ? VASECTOMY    ? VIDEO ASSISTED THORACOSCOPY (VATS)/WEDGE RESECTION Left 06/04/2013  ? Procedure: VIDEO ASSISTED THORACOSCOPY (VATS)/WEDGE RESECTION;  Surgeon: Lilia Argue  Servando Snare, MD;  Location: [place];  Service: Thoracic;  Laterality: Left;  ? VIDEO BRONCHOSCOPY N/A 06/04/2013  ? Procedure: VIDEO BRONCHOSCOPY;  Surgeon: [name], MD;  Location: [place];  Service: Thoracic;  Laterality: N/A;  ? ? ?FAMILY HISTORY: ?Family History  ?Problem Relation Age of Onset  ? Stroke Mother   ? Alzheimer's disease Mother   ? Hypertension Sister   ? Hyperlipidemia Sister   ? Hypertension Brother   ? Hyperlipidemia Brother   ? Diabetes Brother   ? ? ?ADVANCED DIRECTIVES (Y/N):  N ? ?HEALTH MAINTENANCE: ?Social History  ? ?Tobacco Use  ? Smoking status: Former  ?  Packs/day: 1.00  ?  Years: 40.00  ?  Pack years: 40.00  ?  Types: Cigarettes  ?  Quit date: 12/09/1998  ?  Years since quitting: 22.6  ? Smokeless tobacco: Never  ?Vaping Use  ? Vaping Use: Never used   ?Substance Use Topics  ? Alcohol use: No  ? Drug use: No  ? ? ? Colonoscopy: ? PAP: ? Bone density: ? Lipid panel: ? ?Allergies  ?Allergen Reactions  ? Tape Itching  ?  Surgical tapes ?  ? ? ?Current Outpati

## 2021-08-10 ENCOUNTER — Other Ambulatory Visit: Payer: Medicare Other

## 2021-08-10 ENCOUNTER — Other Ambulatory Visit
Admission: RE | Admit: 2021-08-10 | Discharge: 2021-08-10 | Disposition: A | Discharge status: Home / Self Care | Payer: Medicare Other | Source: Ambulatory Visit | Attending: Pulmonary Disease | Admitting: Pulmonary Disease

## 2021-08-10 ENCOUNTER — Encounter: Payer: Self-pay | Admitting: Oncology

## 2021-08-10 ENCOUNTER — Other Ambulatory Visit: Admission: RE | Admit: 2021-08-10 | Payer: Medicare Other | Source: Ambulatory Visit

## 2021-08-10 DIAGNOSIS — Z20822 Contact with and (suspected) exposure to covid-19: Secondary | ICD-10-CM | POA: Diagnosis not present

## 2021-08-10 DIAGNOSIS — Z01812 Encounter for preprocedural laboratory examination: Secondary | ICD-10-CM | POA: Insufficient documentation

## 2021-08-11 ENCOUNTER — Inpatient Hospital Stay: Payer: Medicare Other

## 2021-08-11 ENCOUNTER — Inpatient Hospital Stay: Payer: Medicare Other | Attending: Oncology

## 2021-08-11 ENCOUNTER — Inpatient Hospital Stay (HOSPITAL_BASED_OUTPATIENT_CLINIC_OR_DEPARTMENT_OTHER): Payer: Medicare Other | Admitting: Oncology

## 2021-08-11 VITALS — BP 132/68 | HR 62 | Temp 97.9°F | Resp 16 | Ht 64.0 in | Wt 161.5 lb

## 2021-08-11 DIAGNOSIS — C7A8 Other malignant neuroendocrine tumors: Secondary | ICD-10-CM | POA: Insufficient documentation

## 2021-08-11 DIAGNOSIS — Z79899 Other long term (current) drug therapy: Secondary | ICD-10-CM | POA: Diagnosis not present

## 2021-08-11 DIAGNOSIS — C787 Secondary malignant neoplasm of liver and intrahepatic bile duct: Secondary | ICD-10-CM | POA: Diagnosis not present

## 2021-08-11 DIAGNOSIS — C7B8 Other secondary neuroendocrine tumors: Secondary | ICD-10-CM

## 2021-08-11 LAB — CBC WITH DIFFERENTIAL/PLATELET
Abs Immature Granulocytes: 0.19 10*3/uL — ABNORMAL HIGH (ref 0.00–0.07)
Basophils Absolute: 0.1 10*3/uL (ref 0.0–0.1)
Basophils Relative: 1 %
Eosinophils Absolute: 0.1 10*3/uL (ref 0.0–0.5)
Eosinophils Relative: 1 %
HCT: 44.2 % (ref 39.0–52.0)
Hemoglobin: 14.5 g/dL (ref 13.0–17.0)
Immature Granulocytes: 2 %
Lymphocytes Relative: 9 %
Lymphs Abs: 1 10*3/uL (ref 0.7–4.0)
MCH: 30.7 pg (ref 26.0–34.0)
MCHC: 32.8 g/dL (ref 30.0–36.0)
MCV: 93.4 fL (ref 80.0–100.0)
Monocytes Absolute: 1.8 10*3/uL — ABNORMAL HIGH (ref 0.1–1.0)
Monocytes Relative: 15 %
Neutro Abs: 8.2 10*3/uL — ABNORMAL HIGH (ref 1.7–7.7)
Neutrophils Relative %: 72 %
Platelets: 180 10*3/uL (ref 150–400)
RBC: 4.73 MIL/uL (ref 4.22–5.81)
RDW: 14.2 % (ref 11.5–15.5)
WBC: 11.4 10*3/uL — ABNORMAL HIGH (ref 4.0–10.5)
nRBC: 0 % (ref 0.0–0.2)

## 2021-08-11 LAB — SARS CORONAVIRUS 2 (TAT 6-24 HRS): SARS Coronavirus 2: NEGATIVE

## 2021-08-11 LAB — COMPREHENSIVE METABOLIC PANEL
ALT: 17 U/L (ref 0–44)
AST: 20 U/L (ref 15–41)
Albumin: 4.1 g/dL (ref 3.5–5.0)
Alkaline Phosphatase: 110 U/L (ref 38–126)
Anion gap: 7 (ref 5–15)
BUN: 16 mg/dL (ref 8–23)
CO2: 26 mmol/L (ref 22–32)
Calcium: 9.3 mg/dL (ref 8.9–10.3)
Chloride: 105 mmol/L (ref 98–111)
Creatinine, Ser: 0.93 mg/dL (ref 0.61–1.24)
GFR, Estimated: >60 mL/min (ref >60)
Glucose, Bld: 140 mg/dL — ABNORMAL HIGH (ref 70–99)
Potassium: 4.4 mmol/L (ref 3.5–5.1)
Sodium: 138 mmol/L (ref 135–145)
Total Bilirubin: 0.6 mg/dL (ref 0.3–1.2)
Total Protein: 6.9 g/dL (ref 6.5–8.1)

## 2021-08-11 MED ORDER — LANREOTIDE ACETATE 120 MG/0.5ML ~~LOC~~ SOLN
120.0000 mg | Freq: Once | SUBCUTANEOUS | Status: AC
  Administered 2021-08-11: 120 mg via SUBCUTANEOUS
  Filled 2021-08-11: qty 120

## 2021-08-11 NOTE — Addendum Note (Signed)
Addended by: Delice Bison E on: 08/11/2021 03:15 PM ? ? Modules accepted: Orders ? ?

## 2021-08-12 ENCOUNTER — Ambulatory Visit
Admission: RE | Admit: 2021-08-12 | Discharge: 2021-08-12 | Disposition: A | Discharge status: Home / Self Care | Payer: Medicare Other | Attending: Pulmonary Disease | Admitting: Pulmonary Disease

## 2021-08-12 ENCOUNTER — Ambulatory Visit: Payer: Medicare Other | Admitting: Anesthesiology

## 2021-08-12 ENCOUNTER — Ambulatory Visit: Payer: Medicare Other | Admitting: Internal Medicine

## 2021-08-12 ENCOUNTER — Other Ambulatory Visit: Payer: Self-pay

## 2021-08-12 ENCOUNTER — Ambulatory Visit: Payer: Medicare Other

## 2021-08-12 ENCOUNTER — Encounter
Admission: RE | Disposition: A | Discharge status: Home / Self Care | Payer: Self-pay | Source: Home / Self Care | Attending: Pulmonary Disease

## 2021-08-12 ENCOUNTER — Encounter: Payer: Self-pay | Admitting: Pulmonary Disease

## 2021-08-12 DIAGNOSIS — E119 Type 2 diabetes mellitus without complications: Secondary | ICD-10-CM | POA: Insufficient documentation

## 2021-08-12 DIAGNOSIS — J9809 Other diseases of bronchus, not elsewhere classified: Secondary | ICD-10-CM

## 2021-08-12 DIAGNOSIS — Z87891 Personal history of nicotine dependence: Secondary | ICD-10-CM | POA: Insufficient documentation

## 2021-08-12 DIAGNOSIS — J8410 Pulmonary fibrosis, unspecified: Secondary | ICD-10-CM | POA: Diagnosis not present

## 2021-08-12 DIAGNOSIS — J449 Chronic obstructive pulmonary disease, unspecified: Secondary | ICD-10-CM | POA: Diagnosis not present

## 2021-08-12 DIAGNOSIS — I4891 Unspecified atrial fibrillation: Secondary | ICD-10-CM | POA: Insufficient documentation

## 2021-08-12 DIAGNOSIS — Z8616 Personal history of COVID-19: Secondary | ICD-10-CM | POA: Diagnosis not present

## 2021-08-12 DIAGNOSIS — C7A090 Malignant carcinoid tumor of the bronchus and lung: Secondary | ICD-10-CM | POA: Diagnosis present

## 2021-08-12 DIAGNOSIS — R0602 Shortness of breath: Secondary | ICD-10-CM | POA: Insufficient documentation

## 2021-08-12 DIAGNOSIS — C7A8 Other malignant neuroendocrine tumors: Secondary | ICD-10-CM | POA: Diagnosis not present

## 2021-08-12 DIAGNOSIS — Z902 Acquired absence of lung [part of]: Secondary | ICD-10-CM | POA: Diagnosis not present

## 2021-08-12 HISTORY — PX: VIDEO BRONCHOSCOPY: SHX5072

## 2021-08-12 SURGERY — VIDEO BRONCHOSCOPY WITHOUT FLUORO
Anesthesia: General | Laterality: Left

## 2021-08-12 MED ORDER — SEVOFLURANE IN SOLN
RESPIRATORY_TRACT | Status: AC
  Filled 2021-08-12: qty 250

## 2021-08-12 MED ORDER — DEXAMETHASONE SODIUM PHOSPHATE 10 MG/ML IJ SOLN
INTRAMUSCULAR | Status: DC | PRN
  Administered 2021-08-12: 5 mg via INTRAVENOUS

## 2021-08-12 MED ORDER — ACETAMINOPHEN 10 MG/ML IV SOLN: 1000.0000 mg | Freq: Once | INTRAVENOUS | Status: DC | PRN

## 2021-08-12 MED ORDER — IPRATROPIUM-ALBUTEROL 0.5-2.5 (3) MG/3ML IN SOLN
RESPIRATORY_TRACT | Status: AC
  Administered 2021-08-12: 3 mL via RESPIRATORY_TRACT
  Filled 2021-08-12: qty 3

## 2021-08-12 MED ORDER — OXYCODONE HCL 5 MG/5ML PO SOLN: 5.0000 mg | Freq: Once | ORAL | Status: DC | PRN

## 2021-08-12 MED ORDER — ONDANSETRON HCL 4 MG/2ML IJ SOLN: 4.0000 mg | Freq: Once | INTRAMUSCULAR | Status: DC | PRN

## 2021-08-12 MED ORDER — ORAL CARE MOUTH RINSE: 15.0000 mL | Freq: Once | OROMUCOSAL | Status: AC

## 2021-08-12 MED ORDER — LIDOCAINE HCL (PF) 2 % IJ SOLN
INTRAMUSCULAR | Status: AC
  Filled 2021-08-12: qty 5

## 2021-08-12 MED ORDER — SODIUM CHLORIDE 0.9 % IV SOLN: Freq: Once | INTRAVENOUS | Status: AC

## 2021-08-12 MED ORDER — ROCURONIUM BROMIDE 100 MG/10ML IV SOLN
INTRAVENOUS | Status: DC | PRN
  Administered 2021-08-12: 50 mg via INTRAVENOUS

## 2021-08-12 MED ORDER — FAMOTIDINE 20 MG PO TABS
ORAL_TABLET | ORAL | Status: AC
  Administered 2021-08-12: 20 mg via ORAL
  Filled 2021-08-12: qty 1

## 2021-08-12 MED ORDER — SUGAMMADEX SODIUM 500 MG/5ML IV SOLN
INTRAVENOUS | Status: DC | PRN
  Administered 2021-08-12: 400 mg via INTRAVENOUS

## 2021-08-12 MED ORDER — LIDOCAINE HCL (CARDIAC) PF 100 MG/5ML IV SOSY
PREFILLED_SYRINGE | INTRAVENOUS | Status: DC | PRN
  Administered 2021-08-12: 100 mg via INTRAVENOUS

## 2021-08-12 MED ORDER — CHLORHEXIDINE GLUCONATE 0.12 % MT SOLN: 15.0000 mL | Freq: Once | OROMUCOSAL | Status: AC

## 2021-08-12 MED ORDER — CHLORHEXIDINE GLUCONATE 0.12 % MT SOLN
OROMUCOSAL | Status: AC
  Administered 2021-08-12: 15 mL via OROMUCOSAL
  Filled 2021-08-12: qty 15

## 2021-08-12 MED ORDER — FAMOTIDINE 20 MG PO TABS: 20.0000 mg | ORAL_TABLET | Freq: Once | ORAL | Status: AC

## 2021-08-12 MED ORDER — SODIUM CHLORIDE 0.9 % IV SOLN: INTRAVENOUS | Status: DC | PRN

## 2021-08-12 MED ORDER — PROPOFOL 10 MG/ML IV BOLUS
INTRAVENOUS | Status: AC
  Filled 2021-08-12: qty 20

## 2021-08-12 MED ORDER — FENTANYL CITRATE (PF) 100 MCG/2ML IJ SOLN
INTRAMUSCULAR | Status: DC | PRN
  Administered 2021-08-12 (×3): 25 ug via INTRAVENOUS

## 2021-08-12 MED ORDER — FENTANYL CITRATE (PF) 100 MCG/2ML IJ SOLN: 25.0000 ug | INTRAMUSCULAR | Status: DC | PRN

## 2021-08-12 MED ORDER — FENTANYL CITRATE (PF) 100 MCG/2ML IJ SOLN
INTRAMUSCULAR | Status: AC
Start: 2021-08-12 — End: ?
  Filled 2021-08-12: qty 2

## 2021-08-12 MED ORDER — OXYCODONE HCL 5 MG PO TABS: 5.0000 mg | ORAL_TABLET | Freq: Once | ORAL | Status: DC | PRN

## 2021-08-12 MED ORDER — PROPOFOL 10 MG/ML IV BOLUS
INTRAVENOUS | Status: DC | PRN
  Administered 2021-08-12: 100 mg via INTRAVENOUS

## 2021-08-12 MED ORDER — ONDANSETRON HCL 4 MG/2ML IJ SOLN
INTRAMUSCULAR | Status: DC | PRN
  Administered 2021-08-12: 4 mg via INTRAVENOUS

## 2021-08-12 MED ORDER — IPRATROPIUM-ALBUTEROL 0.5-2.5 (3) MG/3ML IN SOLN: 3.0000 mL | Freq: Once | RESPIRATORY_TRACT | Status: AC

## 2021-08-12 NOTE — Anesthesia Postprocedure Evaluation (Signed)
Anesthesia Post Note ? ?Patient: Jason Malone ? ?Procedure(s) Performed: VIDEO BRONCHOSCOPY WITHOUT FLUORO (Left) ?THERAPEUTIC SPRAY CRYOTHERAPY WITH TUMOR DESTRUCTION (Left) ? ?Patient location during evaluation: PACU ?Anesthesia Type: General ?Level of consciousness: awake and alert ?Pain management: pain level controlled ?Vital Signs Assessment: post-procedure vital signs reviewed and stable ?Respiratory status: spontaneous breathing, nonlabored ventilation, respiratory function stable and patient connected to nasal cannula oxygen ?Cardiovascular status: blood pressure returned to baseline and stable ?Postop Assessment: no apparent nausea or vomiting ?Anesthetic complications: no ? ? ?No notable events documented. ? ? ?Last Vitals:  ?Vitals:  ? 08/12/21 1403 08/12/21 1426  ?BP: 128/61 (!) 142/71  ?Pulse: 65 69  ?Resp: 18 18  ?Temp:  (!) 36.2 ?C  ?SpO2: 100% 94%  ?  ?Last Pain:  ?Vitals:  ? 08/12/21 1426  ?TempSrc:   ?PainSc: 0-No pain  ? ? ?  ?  ?  ?  ?  ?  ? ?Arita Miss ? ? ? ? ?

## 2021-08-12 NOTE — Anesthesia Preprocedure Evaluation (Signed)
Anesthesia Evaluation  ?Patient identified by MRN, date of birth, ID band ?Patient awake ? ? ? ?Reviewed: ?Allergy & Precautions, H&P , NPO status , Patient's Chart, lab work & pertinent test results, reviewed documented beta blocker date and time  ? ?Airway ?Mallampati: I ? ?TM Distance: >3 FB ?Neck ROM: full ? ? ? Dental ? ?(+) Edentulous Upper, Edentulous Lower ?  ?Pulmonary ?shortness of breath and with exertion, neg sleep apnea, pneumonia, resolved, neg COPD, Recent URI  (Had COVID and is still SOB), Patient abstained from smoking., former smoker,  ?S/p LULobectomy ?  ?Pulmonary exam normal ? ?+ decreased breath sounds ? ? ? ? ? Cardiovascular ?Exercise Tolerance: Good ?hypertension, On Home Beta Blockers and On Medications ?(-) angina(-) CAD, (-) Past MI, (-) Cardiac Stents and (-) CABG Normal cardiovascular exam+ dysrhythmias Atrial Fibrillation (-) Valvular Problems/Murmurs ?Rhythm:regular Rate:Normal ? ?TTE 2021: ? ??1. Left ventricular ejection fraction, by estimation, is 55 to 60%. The  ?left ventricle has normal function. The left ventricle has no regional  ?wall motion abnormalities. There is mild left ventricular hypertrophy.  ?Left ventricular diastolic parameters  ?are consistent with Grade II diastolic dysfunction (pseudonormalization).  ??2. Right ventricular systolic function is normal. The right ventricular  ?size is normal. Tricuspid regurgitation signal is inadequate for assessing  ?PA pressure.  ??3. Left atrial size was mild to moderately dilated.  ??4. The mitral valve is normal in structure. Mild mitral valve  ?regurgitation. No evidence of mitral stenosis.  ??5. The aortic valve is tricuspid. Aortic valve regurgitation is not  ?visualized. No aortic stenosis is present.  ?  ?Neuro/Psych ?negative neurological ROS ? negative psych ROS  ? GI/Hepatic ?negative GI ROS, Neg liver ROS,   ?Endo/Other  ?diabetes, Type 2 ? Renal/GU ?Renal disease  ?negative  genitourinary ?  ?Musculoskeletal ? ? Abdominal ?  ?Peds ? Hematology ?negative hematology ROS ?(+)   ?Anesthesia Other Findings ?Past Medical History: ?  Full dentures                                              ?    Comment:upper and lower ?  Wears glasses                                              ?  Enlarged prostate                                          ?  Hypomagnesemia                                             ?  H/O hypokalemia                                            ?  Diverticulitis                                             ?  Comment:DIVERTICULOSIS ?  Hyperlipemia                                               ?  Paroxysmal atrial flutter ([place])                            ?    Comment:a. 03/2013->no recurrence;  b. CHA2DS2VASc =  ?             2-->not currently on anticoagulation;  c.  ?             05/2012 Echo: EF 55-60%, normal RV. ?  Allergic rhinitis                                          ?  IFG (impaired fasting glucose)                             ?  Monocytosis                                                ?  Benign prostatic hypertrophy with lower urinar*            ?  Chest pain                                                 ?    Comment:a. 05/2013 MV: EF 70%, no ischemia. ?  Lung cancer ([place])                                          ?    Comment:a. carcinoid, left lung, Stage 1b (T2a, N0,  ?             cM0);  b. 05/2013 s/p VATS & LULobectomy. ?  Essential hypertension                                     ?    Comment:CONTROLLED ON MEDS ?  Hx of atrial fibrillation without current medi*            ?    Comment:CARDIOLOGIST-DR GOLLAN/ [facility] ? ? Reproductive/Obstetrics ?negative OB ROS ? ?  ? ? ? ? ? ? ? ? ? ? ? ? ? ?  ?  ? ? ? ? ? ? ? ? ?Anesthesia Physical ? ?Anesthesia Plan ? ?ASA: 3 ? ?Anesthesia Plan: General  ? ?Post-op Pain Management: Minimal or no pain anticipated  ? ?Induction: Intravenous ? ?PONV Risk Score and Plan: 2 and Ondansetron, Dexamethasone and Treatment may vary  due to age or medical condition ? ?Airway Management Planned: Oral ETT ? ?Additional Equipment: None ? ?Intra-op Plan:  ? ?Post-operative Plan: Extubation in OR  and Possible Post-op intubation/ventilation ? ?Informed Consent: I have reviewed the patients History and Physical, chart, labs and discussed the procedure including the risks, benefits and alternatives for the proposed anesthesia with the patient or authorized representative who has indicated his/her understanding and acceptance.  ? ? ? ?Dental Advisory Given ? ?Plan Discussed with: Anesthesiologist, CRNA and Surgeon ? ?Anesthesia Plan Comments: (Discussed risks of anesthesia with patient, including PONV, sore throat, lip/dental damage. Rare risks discussed as well, such as cardiorespiratory and neurological sequelae. Patient understands.)  ? ? ? ? ? ? ?Anesthesia Quick Evaluation ? ?

## 2021-08-12 NOTE — Anesthesia Procedure Notes (Signed)
Procedure Name: Intubation ?Date/Time: 08/12/2021 1:08 PM ?Performed by: Loletha Grayer, CRNA ?Pre-anesthesia Checklist: Patient identified, Patient being monitored, Timeout performed, Emergency Drugs available and Suction available ?Patient Re-evaluated:Patient Re-evaluated prior to induction ?Oxygen Delivery Method: Circle system utilized ?Preoxygenation: Pre-oxygenation with 100% oxygen ?Induction Type: IV induction ?Ventilation: Mask ventilation without difficulty and Oral airway inserted - appropriate to patient size ?Laryngoscope Size: 3 and McGraph ?Grade View: Grade I ?Tube type: Oral ?Tube size: 9.0 mm ?Number of attempts: 1 ?Airway Equipment and Method: Stylet ?Placement Confirmation: ETT inserted through vocal cords under direct vision, positive ETCO2 and breath sounds checked- equal and bilateral ?Secured at: 21 cm ?Tube secured with: Tape ?Dental Injury: Teeth and Oropharynx as per pre-operative assessment  ? ? ? ? ?

## 2021-08-12 NOTE — Interval H&P Note (Signed)
History and Physical Interval Note: ? ?08/12/2021 ?11:43 AM ? ?Jason Malone  has presented today for surgery, with the diagnosis of LEFT LUNG MASS.  The various methods of treatment have been discussed with the patient and family. After consideration of risks, benefits and other options for treatment, the patient has consented to  Procedure(s): ?VIDEO BRONCHOSCOPY WITHOUT FLUORO (Left) ?THERAPEUTIC SPRAY CRYOTHERAPY WITH TUMOR DESTRUCTION (Left) as a surgical intervention.  The patient's history has been reviewed, patient examined, no change in status, stable for surgery.  I have reviewed the patient's chart and labs.  Questions were answered to the patient's satisfaction.   ? ? ?[name] ? ? ?

## 2021-08-12 NOTE — Transfer of Care (Signed)
Immediate Anesthesia Transfer of Care Note ? ?Patient: Jason Malone ? ?Procedure(s) Performed: VIDEO BRONCHOSCOPY WITHOUT FLUORO (Left) ?THERAPEUTIC SPRAY CRYOTHERAPY WITH TUMOR DESTRUCTION (Left) ? ?Patient Location: PACU ? ?Anesthesia Type:General ? ?Level of Consciousness: awake ? ?Airway & Oxygen Therapy: Patient Spontanous Breathing and Patient connected to face mask oxygen ? ?Post-op Assessment: Report given to RN and Post -op Vital signs reviewed and stable ? ?Post vital signs: Reviewed and stable ? ?Last Vitals:  ?Vitals Value Taken Time  ?BP 128/57 08/12/21 1354  ?Temp 36.2 ?C 08/12/21 1353  ?Pulse 66 08/12/21 1358  ?Resp 15 08/12/21 1358  ?SpO2 100 % 08/12/21 1358  ?Vitals shown include unvalidated device data. ? ?Last Pain:  ?Vitals:  ? 08/12/21 1353  ?TempSrc:   ?PainSc: 0-No pain  ?   ? ?  ? ?Complications: No notable events documented. ?

## 2021-08-12 NOTE — Discharge Instructions (Signed)

## 2021-08-12 NOTE — Op Note (Signed)
?PROCEDURE(S):  ?BRONCHOSCOPY WITH SPRAY CRYOTHERAPY ?TUMOR DESTRUCTION AND ABLATION ? ?  ? ?Indications/Preliminary Diagnosis:  ?Left main bronchial obstruction on CT scan ?Known neuroendocrine carcinoma of the lung (carcinoid), with recurrence ? ? ? ?Benefits, limitations and potential complications of the procedure were discussed with the patient/family.  Complications from bronchoscopy are rare and most often minor, but if they occur they may include breathing difficulty, vocal cord spasm, hoarseness, slight fever, vomiting, dizziness, bronchospasm, infection, low blood oxygen, bleeding from biopsy site, or an allergic reaction to medications.  It is uncommon for patients to experience other more serious complications for example: Collapsed lung requiring chest tube placement, respiratory failure, heart attack and/or cardiac arrhythmia.  Patient agreed to proceed. ? ?Surgeon: Renold Don, MD ?Assistant/Scrub: Liborio Nixon, RRT ?Circulator: Annia Belt, RRT ?Anesthesiologist/CRNA: Arita Miss, MD/Toby Mosetta Pigeon, CRNA ?Representatives: Three Ronnald Ramp, RN - Clinical Specialist [email or web address] ? ? ?Description of the procedure: ?The patient was taken to Procedure Room 2 (Bronchoscopy Suite) in the OR area, where appropriate timeout was taken with the staff.  Patient was inducted under general anesthesia by the anesthesia team and intubated with a #9.0 ET tube.  A Portex adapter was placed on the endotracheal tube flange.  Once the patient was under  general anesthesia, the Olympus therapeutic video bronchoscope was advanced via the Portex adapter.  The airways were examined.  The visible trachea was normal, carina was sharp, the right mainstem bronchus was without any lesions or secretions.  Right upper lobe subsegments were without lesions or inspissated secretions.  Right middle lobe and right lower lobe subsegments were normal.  Bronchoscope was then brought to the left mainstem bronchus where immediately was noted a  large lobulated mass with significant vascularity occluding the left mainstem bronchus.  There was less than 10% of lumen visible due to the occlusion of this mass.  At this point it was elected to treat the mass with spray cryotherapy to prevent excessive bleeding.  Utilizing the truFreeze catheter this was inserted through the working channel of the bronchoscope, the patient was disconnected from the anesthesia circuit, balloon on the ET tube was deflated, breath-hold was initiated and at this point the lesion was treated with a 5-second freeze to the lesion.  The chest was monitored during the procedure by the circulator.  Once this freeze was done, the patient was then placed back on the circuit and allowed to ventilate.  Patient was allowed to equilibrate.  This freezing procedure was repeated x2.  Once this was done the lesion was then blanched with 3 mL of 1-10,000 epinephrine.  At this point utilizing a 10 mm snare the lesion was snared, excised and retrieved in toto.  Material was placed in formalin.  There was minimal heme noted.  Inspection of the lumen after excision of this mass revealed that there were 2 smaller masses behind this.  Patient then received 1 more cycle of 5-second freeze to this area and the smaller mass that was closest to the excised larger mass was then excised using large volume forceps.  This opened the left mainstem bronchus further and the lower lobe subsegments were now visible.  Once this was completed another 2 cycles of freezing were performed as above this to ablate the remainder of the abnormal tissue and for hemostasis.  There was residual tumor left however, this will have to be treated at a separate time.  Once that was completed and adequate hemostasis was confirmed, the patient received 9 mL of 1% lidocaine  via bronchial lavage and the bronchoscope was retrieved and the procedure terminated.  The patient was then allowed to to emerge from anesthesia and was extubated in  the procedure room without difficulty.  Patient was transferred to the PACU in satisfactory and stable condition. ? ? ?TECHNICAL PROCEDURES: (Place X beside choice below) ?  Procedures  Description  ?  None   ?  Electrocautery   ?X Tumor destruction   ?  Balloon Dilatation   ?  Bronchography   ?  Stent Placement   ?  Therapeutic Aspiration   ?X Spray cryotherapy   ? Brachytherapy Catheter Placement   ? Foreign Body Removal    ? ? ? ? ? ?SPECIMENS (Sites): (Place X beside choice below) ? Specimens Description  ? No Specimens Obtained    ? Washings   ? Lavage   ?X Biopsies Excised tumor, left main bronchus  ? Fine Needle Aspirates   ? Brushings   ? Sputum   ? ?FINDINGS:  ? ? ?Right upper lobe, no obstruction, normal. ? ? ? ?Right lower lobe subsegments, no lesions. ? ? ?Mass obstructing the left mainstem bronchus with less than 5%  patency of the bronchus ? ? ?Very vascular appearance of the mass. ? ? ?Mass after cryotherapy treatment  prior to excision. ? ? ?Secondary mass occluding left mainstem with approximately 30% of lumen intact. ? ? ?After excision of the secondary mass, smaller mass remains on the upper bronchial segment above, this was treated with cryotherapy for ablation.  Hemostasis is excellent. ? ? ? ?Visible lower subsegments of the left lung now patent after relief of obstruction.  Some abnormal mucosa noted, this will need monitoring. ? ? ?ESTIMATED BLOOD LOSS:  < 5 ml ?COMPLICATIONS/RESOLUTION: none ?Postprocedure chest x-ray shows no pneumothorax ? ? ? ? ?IMPRESSION:POST-PROCEDURE DX:  ?Left main bronchial obstruction due to recurrent neuroendocrine tumor (carcinoid) in the lung ?Status post successful tumor ablation and destruction ? ? ?RECOMMENDATION/PLAN:  ?Patient will require retreatment in 3 to 4 weeks ?Continue lanreotide as per oncology ? ?C. Derrill Kay, MD ?Advanced Bronchoscopy ?PCCM [facility] ? ? ? ?*This note was dictated using voice recognition software/Dragon.   Despite best efforts to proofread, errors can occur which can change the meaning. Any transcriptional errors that result from this process are unintentional and may not be fully corrected at the time of dictation. ? ? ? ?  ?

## 2021-08-13 LAB — SURGICAL PATHOLOGY

## 2021-08-13 LAB — CHROMOGRANIN A: Chromogranin A (ng/mL): 760.9 ng/mL — ABNORMAL HIGH (ref 0.0–101.8)

## 2021-08-14 ENCOUNTER — Encounter: Payer: Self-pay | Admitting: Pulmonary Disease

## 2021-08-17 ENCOUNTER — Emergency Department: Payer: Medicare Other

## 2021-08-17 ENCOUNTER — Other Ambulatory Visit: Payer: Self-pay

## 2021-08-17 ENCOUNTER — Telehealth: Payer: Self-pay | Admitting: Pulmonary Disease

## 2021-08-17 ENCOUNTER — Emergency Department
Admission: EM | Admit: 2021-08-17 | Discharge: 2021-08-17 | Disposition: A | Discharge status: Home / Self Care | Payer: Medicare Other | Attending: Emergency Medicine | Admitting: Emergency Medicine

## 2021-08-17 ENCOUNTER — Encounter: Payer: Self-pay | Admitting: Emergency Medicine

## 2021-08-17 DIAGNOSIS — J449 Chronic obstructive pulmonary disease, unspecified: Secondary | ICD-10-CM | POA: Insufficient documentation

## 2021-08-17 DIAGNOSIS — Z85118 Personal history of other malignant neoplasm of bronchus and lung: Secondary | ICD-10-CM | POA: Insufficient documentation

## 2021-08-17 DIAGNOSIS — I809 Phlebitis and thrombophlebitis of unspecified site: Secondary | ICD-10-CM | POA: Diagnosis not present

## 2021-08-17 DIAGNOSIS — M79601 Pain in right arm: Secondary | ICD-10-CM | POA: Diagnosis present

## 2021-08-17 LAB — CBC
HCT: 47.3 % (ref 39.0–52.0)
Hemoglobin: 15.1 g/dL (ref 13.0–17.0)
MCH: 29.8 pg (ref 26.0–34.0)
MCHC: 31.9 g/dL (ref 30.0–36.0)
MCV: 93.3 fL (ref 80.0–100.0)
Platelets: 216 10*3/uL (ref 150–400)
RBC: 5.07 MIL/uL (ref 4.22–5.81)
RDW: 14.4 % (ref 11.5–15.5)
WBC: 13.3 10*3/uL — ABNORMAL HIGH (ref 4.0–10.5)
nRBC: 0 % (ref 0.0–0.2)

## 2021-08-17 LAB — BASIC METABOLIC PANEL
Anion gap: 10 (ref 5–15)
BUN: 20 mg/dL (ref 8–23)
CO2: 27 mmol/L (ref 22–32)
Calcium: 9.8 mg/dL (ref 8.9–10.3)
Chloride: 104 mmol/L (ref 98–111)
Creatinine, Ser: 1 mg/dL (ref 0.61–1.24)
GFR, Estimated: >60 mL/min (ref >60)
Glucose, Bld: 105 mg/dL — ABNORMAL HIGH (ref 70–99)
Potassium: 4 mmol/L (ref 3.5–5.1)
Sodium: 141 mmol/L (ref 135–145)

## 2021-08-17 NOTE — ED Provider Notes (Signed)
? ?[facility] ?Provider Note ? ? ? Event Date/Time  ? First MD Initiated Contact with Patient 08/17/21 1154   ?  (approximate) ? ? ?History  ? ?Arm pain ? ? ?HPI ? ?Jason Malone is a 81 y.o. male with history of COPD, lung CA, paroxysmal atrial flutter, prediabetes who recently had bronchoscopy procedure who presents with complaints of pain in his right arm.  Patient describes pain at the site of an attempted IV placement and also somewhat proximal to that area.  He is also concerned because his sugar was 203 today.  Denies fevers or chills.  No cough chest pain or shortness of breath. ?  ? ? ?Physical Exam  ? ?Triage Vital Signs: ?ED Triage Vitals  ?Enc Vitals Group  ?   BP 08/17/21 1147 (!) 158/75  ?   Pulse Rate 08/17/21 1147 67  ?   Resp 08/17/21 1147 20  ?   Temp 08/17/21 1147 97.9 ?F (36.6 ?C)  ?   Temp Source 08/17/21 1147 Oral  ?   SpO2 08/17/21 1147 98 %  ?   Weight 08/17/21 1144 69.9 kg (154 lb)  ?   Height 08/17/21 1144 1.626 m (5\' 4" )  ?   Head Circumference --   ?   Peak Flow --   ?   Pain Score 08/17/21 1143 9  ?   Pain Loc --   ?   Pain Edu? --   ?   Excl. in [place]? --   ? ? ?Most recent vital signs: ?Vitals:  ? 08/17/21 1147 08/17/21 1445  ?BP: (!) 158/75 (!) 150/70  ?Pulse: 67 70  ?Resp: 20 18  ?Temp: 97.9 ?F (36.6 ?C)   ?SpO2: 98% 98%  ? ? ? ?General: Awake, no distress.  ?CV:  Good peripheral perfusion.  ?Resp:  Normal effort.  ?Abd:  No distention.  ?Other:  Right arm: Bruising on the dorsal surface of the mid forearm from prior IV stick.  No cords or significant swelling.  No evidence of infection ? ? ?ED Results / Procedures / Treatments  ? ?Labs ?(all labs ordered are listed, but only abnormal results are displayed) ?Labs Reviewed  ?CBC - Abnormal; Notable for the following components:  ?    Result Value  ? WBC 13.3 (*)   ? All other components within normal limits  ?BASIC METABOLIC PANEL - Abnormal; Notable for the following components:  ? Glucose, Bld 105 (*)   ? All  other components within normal limits  ? ? ? ?EKG ? ? ? ? ?RADIOLOGY ?Ultrasound right upper extremity ? ? ? ?PROCEDURES: ? ?Critical Care performed:  ? ?Procedures ? ? ?MEDICATIONS ORDERED IN ED: ?Medications - No data to display ? ? ?IMPRESSION / MDM / ASSESSMENT AND PLAN / ED COURSE  ?I reviewed the triage vital signs and the nursing notes. ? ?I suspect the patient has thrombophlebitis although he does describe some pain more proximal to where the IV attempt was ? ?He is concerned that his sugar was 200 today however I doubt that is related to his presentation.  He is afebrile and overall quite well-appearing no evidence of redness to suggest cellulitis. ? ?We will check labs, send for ultrasound of the right upper extremity to rule out deeper DVT ? ?Glucose is 105, white blood cell count is 13.3 which is nonspecific. ? ?Ultrasound is negative for DVT ? ?Recommend treating for phlebitis with warm compresses, outpatient follow-up as needed ? ? ? ? ? ?  ? ? ?  FINAL CLINICAL IMPRESSION(S) / ED DIAGNOSES  ? ?Final diagnoses:  ?Phlebitis  ? ? ? ?Rx / DC Orders  ? ?ED Discharge Orders   ? ? None  ? ?  ? ? ? ?Note:  This document was prepared using Dragon voice recognition software and may include unintentional dictation errors. ?  ?Lavonia Drafts, MD ?08/17/21 1527 ? ?

## 2021-08-17 NOTE — Telephone Encounter (Signed)
Primary Pulmonologist: Patsey Berthold ?Last office visit and with whom: 08/04/21 Patsey Berthold, 08/12/21 Bronchial obstruction ?What do we see them for (pulmonary problems): Neuroendocrine carcinoma of lung, post inflammatory pulmonary fibrosis, hx of covid ?Last OV assessment/plan:  ? ?Assessment & Plan:  ?  ?    ICD-10-CM    ?1. Neuroendocrine carcinoma of lung ([place])  C7A.8    ?  Recurrence on the left mainstem bronchus ?Developing obstruction ?Will need spray cryotherapy and tumor debulking/ablation ?Will need GA for procedure  ?   ?2. 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  Residual right lung, minimal changes ?Chest x-ray does not show worsening after most recent COVID bout  ?   ?3. History of COVID-19  Z86.16    ?  This issue adds complexity to his management ?No significant sequela after most recent bout in February  ?   ?  ?Benefits, limitations and potential complications of the procedure were discussed with the patient/wife.  Complications from bronchoscopy are rare and most often minor, but if they occur they may include breathing difficulty, vocal cord spasm, hoarseness, slight fever, vomiting, dizziness, bronchospasm, infection, low blood oxygen, bleeding from biopsy site, or an allergic reaction to medications.  It is uncommon for patients to experience other more serious complications for example: Collapsed lung requiring chest tube placement, respiratory failure, heart attack and/or cardiac arrhythmia.  Patient understands and agrees to proceed.  All questions answered to satisfaction. ?  ?Patient will undergo bronchoscopic spray cryotherapy directed to the recurrent lesion on the left mainstem bronchus, tumor excision/ablation will be done.  The procedure will be performed under general anesthesia. The patient understands the potential benefits, limitations and complications of the procedure, he agrees to go ahead. ?  ?Procedure has been scheduled for 5 April at 12:30 PM.  We will see the patient  in follow-up in 4 to 6 weeks time he is to contact us sooner should any new difficulties arise. ?  ?C. Derrill Kay, MD ?Advanced Bronchoscopy ?PCCM [facility] ? ? ? ?*This note was dictated using voice recognition software/Dragon.  Despite best efforts to proofread, errors can occur which can change the meaning. Any transcriptional errors that result from this process are unintentional and may not be fully corrected at the time of dictation. ?  ?   ?  ? ? Patient Instructions by Tyler Pita, MD at 08/04/2021 3:00 PM ? ?Author: Tyler Pita, MD Author Type: Physician Filed: 08/04/2021  3:21 PM  ?Note Status: Signed Cosign: Cosign Not Required Encounter Date: 08/04/2021  ?Editor: Tyler Pita, MD (Physician)      ?       ?  ?We have scheduled your procedure for 5 April. ?  ?We will see you in follow-up in 4-6 weeks time. ?  ?  ? ? ?Orthostatic Vitals Recorded in This Encounter ? ? 08/04/2021  ?1456     ?Patient Position: Sitting  ?BP Location: Left Arm  ?Cuff Size: Normal  ? ?Instructions ? ?We have scheduled your procedure for 5 April. ?  ?We will see you in follow-up in 4-6 weeks time. ?   ?  ?  ?After Visit Summary (Printed 08/04/2021) ? ? ? ?Was appointment offered to patient (explain)?  no ? ? ?Reason for call: Patient states since his procedure his blood sugars have ranged from 140-206, prior to procedure they were 100-110.  He is also having pain in his right arm.  Painful to touch.  He took 2  tylenol and this releves the pain.  He states a vein was blown in this arm while at the hospital.  He states the fingers in this hand are also swollen.  Advised I would make Dr. Patsey Berthold aware and I would call him back with her recommendations. ? ?Dr. Patsey Berthold, please advise.  Thank you. ? ?(examples of things to ask: : When did symptoms start? Fever? Cough? Productive? Color to sputum? More sputum than usual? Wheezing? Have you needed increased oxygen? Are you taking your respiratory  medications? What over the counter measures have you tried?) ? ?Allergies  ?Allergen Reactions  ? Tape Itching  ?  Surgical tapes ?  ? ? ?Immunization History  ?Administered Date(s) Administered  ? Fluad Quad(high Dose 65+) 01/24/2019, 01/23/2020, 01/30/2021  ? Influenza, High Dose Seasonal PF 02/12/2016, 02/14/2017, 01/17/2018  ? Influenza,inj,Quad PF,6+ Mos 02/14/2015  ? PFIZER(Purple Top)SARS-COV-2 Vaccination 05/24/2019, 10/02/2019, 04/14/2020  ? Pneumococcal Conjugate-13 02/01/2014  ? Pneumococcal Polysaccharide-23 05/10/2008  ?  ?

## 2021-08-17 NOTE — ED Triage Notes (Signed)
Pt via POV from home. States that he had a bronchoscopy on Wednesday and since then his BP has been high. Pt c/o R arm pain where the IV was placed pt has a hematoma where he had his IV placed. Pt states they blew the vein. States the R arm is also swollen, no obvious swelling noted. No redness noted. Pt is A&OX4 and NAD ?

## 2021-08-17 NOTE — Telephone Encounter (Signed)
I recommend he come by the ED as he may have infection in the arm.  The sugars being out of control may be related to infection if it is present.  Other possibilities are from the tumor itself but I suspect that this is related to potential infectious issue. ?

## 2021-08-17 NOTE — Telephone Encounter (Signed)
Called and spoke with patient's wife, [name] ([place]), provided recommendations per Dr. Patsey Berthold.  She states she already tried to get him to go to the ED and he would not go.  I let her know that if he has an infection he will need an antibiotic to treat it.  Advised that infection can cause blood sugars go up and pain can cause the blood pressure to go up.  She states his blood pressure was 780 systolic and that is not typical for him.  She states she will try to get him to go to the ED and tell him that is what Dr. Butler Denmark recommends.  Nothing further needed. ?

## 2021-08-26 ENCOUNTER — Telehealth: Payer: Self-pay | Admitting: Pulmonary Disease

## 2021-08-26 NOTE — Telephone Encounter (Signed)
Covid test 09/07/2021 at 9:00 and phone pre admit visit 09/08/2021 between 1-5. ? ?Patient is aware of above dates/times and voiced his understanding.  ? ?

## 2021-08-26 NOTE — Telephone Encounter (Signed)
Regular bronchoscopy with cryo therapy and tumor debulking 09/09/2021 at 12:30. ?[id number], [id number], [id number] ?ER:DEYC mass ?  ?Rodena Piety, please see bronch info. Thanks ?

## 2021-08-28 NOTE — Telephone Encounter (Signed)
For the code [phone], [phone], [phone] Prior Auth Not Required  ?Refer # [id number] ?

## 2021-08-28 NOTE — Telephone Encounter (Signed)
Noted  

## 2021-09-07 ENCOUNTER — Other Ambulatory Visit
Admission: RE | Admit: 2021-09-07 | Discharge: 2021-09-07 | Disposition: A | Discharge status: Home / Self Care | Payer: Medicare Other | Source: Ambulatory Visit | Attending: Pulmonary Disease | Admitting: Pulmonary Disease

## 2021-09-07 DIAGNOSIS — Z01812 Encounter for preprocedural laboratory examination: Secondary | ICD-10-CM | POA: Diagnosis present

## 2021-09-07 DIAGNOSIS — Z20822 Contact with and (suspected) exposure to covid-19: Secondary | ICD-10-CM | POA: Insufficient documentation

## 2021-09-08 ENCOUNTER — Inpatient Hospital Stay: Payer: Medicare Other | Attending: Oncology

## 2021-09-08 ENCOUNTER — Other Ambulatory Visit: Payer: Medicare Other

## 2021-09-08 ENCOUNTER — Inpatient Hospital Stay: Payer: Medicare Other | Admitting: Oncology

## 2021-09-08 ENCOUNTER — Inpatient Hospital Stay: Payer: Medicare Other

## 2021-09-08 ENCOUNTER — Encounter
Admission: RE | Admit: 2021-09-08 | Discharge: 2021-09-08 | Disposition: A | Discharge status: Home / Self Care | Payer: Medicare Other | Source: Ambulatory Visit | Attending: Pulmonary Disease | Admitting: Pulmonary Disease

## 2021-09-08 DIAGNOSIS — Z79899 Other long term (current) drug therapy: Secondary | ICD-10-CM | POA: Insufficient documentation

## 2021-09-08 DIAGNOSIS — C7A8 Other malignant neuroendocrine tumors: Secondary | ICD-10-CM

## 2021-09-08 LAB — COMPREHENSIVE METABOLIC PANEL
ALT: 19 U/L (ref 0–44)
AST: 23 U/L (ref 15–41)
Albumin: 4.1 g/dL (ref 3.5–5.0)
Alkaline Phosphatase: 134 U/L — ABNORMAL HIGH (ref 38–126)
Anion gap: 7 (ref 5–15)
BUN: 21 mg/dL (ref 8–23)
CO2: 27 mmol/L (ref 22–32)
Calcium: 9.6 mg/dL (ref 8.9–10.3)
Chloride: 103 mmol/L (ref 98–111)
Creatinine, Ser: 1.01 mg/dL (ref 0.61–1.24)
GFR, Estimated: >60 mL/min (ref >60)
Glucose, Bld: 123 mg/dL — ABNORMAL HIGH (ref 70–99)
Potassium: 4.2 mmol/L (ref 3.5–5.1)
Sodium: 137 mmol/L (ref 135–145)
Total Bilirubin: 0.9 mg/dL (ref 0.3–1.2)
Total Protein: 7.1 g/dL (ref 6.5–8.1)

## 2021-09-08 LAB — CBC WITH DIFFERENTIAL/PLATELET
Abs Immature Granulocytes: 0.01 10*3/uL (ref 0.00–0.07)
Basophils Absolute: 0.1 10*3/uL (ref 0.0–0.1)
Basophils Relative: 1 %
Eosinophils Absolute: 0.1 10*3/uL (ref 0.0–0.5)
Eosinophils Relative: 1 %
HCT: 45.8 % (ref 39.0–52.0)
Hemoglobin: 15 g/dL (ref 13.0–17.0)
Immature Granulocytes: 0 %
Lymphocytes Relative: 10 %
Lymphs Abs: 0.9 10*3/uL (ref 0.7–4.0)
MCH: 30.1 pg (ref 26.0–34.0)
MCHC: 32.8 g/dL (ref 30.0–36.0)
MCV: 92 fL (ref 80.0–100.0)
Monocytes Absolute: 1.6 10*3/uL — ABNORMAL HIGH (ref 0.1–1.0)
Monocytes Relative: 17 %
Neutro Abs: 6.3 10*3/uL (ref 1.7–7.7)
Neutrophils Relative %: 71 %
Platelets: 200 10*3/uL (ref 150–400)
RBC: 4.98 MIL/uL (ref 4.22–5.81)
RDW: 14.4 % (ref 11.5–15.5)
WBC: 9 10*3/uL (ref 4.0–10.5)
nRBC: 0 % (ref 0.0–0.2)

## 2021-09-08 LAB — SARS CORONAVIRUS 2 (TAT 6-24 HRS): SARS Coronavirus 2: NEGATIVE

## 2021-09-08 MED ORDER — LANREOTIDE ACETATE 120 MG/0.5ML ~~LOC~~ SOLN
120.0000 mg | Freq: Once | SUBCUTANEOUS | Status: AC
  Administered 2021-09-08: 120 mg via SUBCUTANEOUS
  Filled 2021-09-08: qty 120

## 2021-09-08 NOTE — Patient Instructions (Signed)
Your procedure is scheduled on: 09/09/21 Report to [facility]. To find out your arrival time please call [phone] between 1PM - 3PM on 09/08/21.  Remember: Instructions that are not followed completely may result in serious medical risk, up to and including death, or upon the discretion of your surgeon and anesthesiologist your surgery may need to be rescheduled.     _X__ 1. Do not eat any food or drink any liquids after midnight the night before your procedure.                 No gum chewing or hard candies.   __X__2.  On the morning of surgery brush your teeth with toothpaste and water, you                 may rinse your mouth with mouthwash if you wish.  Do not swallow any              toothpaste of mouthwash.     _X__ 3.  No Alcohol for 24 hours before or after surgery.   _X__ 4.  Do Not Smoke or use e-cigarettes For 24 Hours Prior to Your Surgery.                 Do not use any chewable tobacco products for at least 6 hours prior to                 surgery.  ____  5.  Bring all medications with you on the day of surgery if instructed.   __X__  6.  Notify your doctor if there is any change in your medical condition      (cold, fever, infections).     Do not wear jewelry, make-up, hairpins, clips or nail polish. Do not wear lotions, powders, or perfumes.  Do not shave 48 hours prior to surgery. Men may shave face and neck. Do not bring valuables to the hospital.    [facility] is not responsible for any belongings or valuables.  Contacts, dentures/partials or body piercings may not be worn into surgery. Bring a case for your contacts, glasses or hearing aids, a denture cup will be supplied. Leave your suitcase in the car. After surgery it may be brought to your room. For patients admitted to the hospital, discharge time is determined by your treatment team.   Patients discharged the day of surgery will not be allowed to  drive home.   Please read over the following fact sheets that you were given:     __X__ Take these medicines the morning of surgery with A SIP OF WATER:    1. amLODipine (NORVASC) 7.5 MG tablet  2.   3.   4.  5.  6.  ____ Fleet Enema (as directed)   ____ Use CHG Soap/SAGE wipes as directed  ____ Use inhalers on the day of surgery  ____ Stop metformin/Janumet/Farxiga 2 days prior to surgery    ____ Take 1/2 of usual insulin dose the night before surgery. No insulin the morning          of surgery.   ____ Stop Blood Thinners Coumadin/Plavix/Xarelto/Pleta/Pradaxa/Eliquis/Effient/Aspirin  on   Or contact your Surgeon, Cardiologist or Medical Doctor regarding  ability to stop your blood thinners  __X__ Stop Anti-inflammatories 7 days before surgery such as Advil, Ibuprofen, Motrin,  BC or Goodies Powder, Naprosyn, Naproxen, Aleve, Aspirin   May take Tylenol today if needed  __X__ Stop all herbals and supplements, fish oil or vitamins  until after surgery.    ____ Bring C-Pap to the hospital.   You may use your nasal spray in the morning if needed

## 2021-09-09 ENCOUNTER — Ambulatory Visit: Payer: Medicare Other | Admitting: Anesthesiology

## 2021-09-09 ENCOUNTER — Encounter
Admission: RE | Disposition: A | Discharge status: Home / Self Care | Payer: Self-pay | Source: Ambulatory Visit | Attending: Pulmonary Disease

## 2021-09-09 ENCOUNTER — Encounter: Payer: Self-pay | Admitting: Pulmonary Disease

## 2021-09-09 ENCOUNTER — Ambulatory Visit
Admission: RE | Admit: 2021-09-09 | Discharge: 2021-09-09 | Disposition: A | Discharge status: Home / Self Care | Payer: Medicare Other | Source: Ambulatory Visit | Attending: Pulmonary Disease | Admitting: Pulmonary Disease

## 2021-09-09 ENCOUNTER — Other Ambulatory Visit: Payer: Self-pay

## 2021-09-09 DIAGNOSIS — J449 Chronic obstructive pulmonary disease, unspecified: Secondary | ICD-10-CM | POA: Diagnosis not present

## 2021-09-09 DIAGNOSIS — C787 Secondary malignant neoplasm of liver and intrahepatic bile duct: Secondary | ICD-10-CM | POA: Insufficient documentation

## 2021-09-09 DIAGNOSIS — Z87891 Personal history of nicotine dependence: Secondary | ICD-10-CM | POA: Diagnosis not present

## 2021-09-09 DIAGNOSIS — M8580 Other specified disorders of bone density and structure, unspecified site: Secondary | ICD-10-CM | POA: Insufficient documentation

## 2021-09-09 DIAGNOSIS — Z79899 Other long term (current) drug therapy: Secondary | ICD-10-CM | POA: Insufficient documentation

## 2021-09-09 DIAGNOSIS — I2510 Atherosclerotic heart disease of native coronary artery without angina pectoris: Secondary | ICD-10-CM | POA: Diagnosis not present

## 2021-09-09 DIAGNOSIS — I4891 Unspecified atrial fibrillation: Secondary | ICD-10-CM | POA: Insufficient documentation

## 2021-09-09 DIAGNOSIS — I10 Essential (primary) hypertension: Secondary | ICD-10-CM | POA: Insufficient documentation

## 2021-09-09 DIAGNOSIS — Z8616 Personal history of COVID-19: Secondary | ICD-10-CM | POA: Insufficient documentation

## 2021-09-09 DIAGNOSIS — E119 Type 2 diabetes mellitus without complications: Secondary | ICD-10-CM | POA: Insufficient documentation

## 2021-09-09 DIAGNOSIS — C7A090 Malignant carcinoid tumor of the bronchus and lung: Secondary | ICD-10-CM | POA: Insufficient documentation

## 2021-09-09 HISTORY — PX: VIDEO BRONCHOSCOPY: SHX5072

## 2021-09-09 LAB — GLUCOSE, CAPILLARY
Glucose-Capillary: 123 mg/dL — ABNORMAL HIGH (ref 70–99)
Glucose-Capillary: 147 mg/dL — ABNORMAL HIGH (ref 70–99)

## 2021-09-09 SURGERY — VIDEO BRONCHOSCOPY WITHOUT FLUORO
Anesthesia: General

## 2021-09-09 MED ORDER — FAMOTIDINE 20 MG PO TABS: 20.0000 mg | ORAL_TABLET | Freq: Once | ORAL | Status: AC

## 2021-09-09 MED ORDER — LIDOCAINE HCL (CARDIAC) PF 100 MG/5ML IV SOSY
PREFILLED_SYRINGE | INTRAVENOUS | Status: DC | PRN
  Administered 2021-09-09: 80 mg via INTRAVENOUS

## 2021-09-09 MED ORDER — CHLORHEXIDINE GLUCONATE 0.12 % MT SOLN: 15.0000 mL | Freq: Once | OROMUCOSAL | Status: AC

## 2021-09-09 MED ORDER — PROPOFOL 10 MG/ML IV BOLUS
INTRAVENOUS | Status: AC
  Filled 2021-09-09: qty 20

## 2021-09-09 MED ORDER — ROCURONIUM BROMIDE 100 MG/10ML IV SOLN
INTRAVENOUS | Status: DC | PRN
  Administered 2021-09-09: 50 mg via INTRAVENOUS

## 2021-09-09 MED ORDER — CHLORHEXIDINE GLUCONATE 0.12 % MT SOLN
OROMUCOSAL | Status: AC
  Administered 2021-09-09: 15 mL via OROMUCOSAL
  Filled 2021-09-09: qty 15

## 2021-09-09 MED ORDER — FAMOTIDINE 20 MG PO TABS
ORAL_TABLET | ORAL | Status: AC
  Administered 2021-09-09: 20 mg via ORAL
  Filled 2021-09-09: qty 1

## 2021-09-09 MED ORDER — FENTANYL CITRATE (PF) 100 MCG/2ML IJ SOLN: 25.0000 ug | INTRAMUSCULAR | Status: DC | PRN

## 2021-09-09 MED ORDER — IPRATROPIUM-ALBUTEROL 0.5-2.5 (3) MG/3ML IN SOLN: 3.0000 mL | Freq: Once | RESPIRATORY_TRACT | Status: AC

## 2021-09-09 MED ORDER — ONDANSETRON HCL 4 MG/2ML IJ SOLN: 4.0000 mg | Freq: Once | INTRAMUSCULAR | Status: DC | PRN

## 2021-09-09 MED ORDER — IPRATROPIUM-ALBUTEROL 0.5-2.5 (3) MG/3ML IN SOLN
RESPIRATORY_TRACT | Status: AC
Start: 2021-09-09 — End: 2021-09-09
  Administered 2021-09-09: 3 mL via RESPIRATORY_TRACT
  Filled 2021-09-09: qty 3

## 2021-09-09 MED ORDER — ORAL CARE MOUTH RINSE: 15.0000 mL | Freq: Once | OROMUCOSAL | Status: AC

## 2021-09-09 MED ORDER — SODIUM CHLORIDE 0.9 % IV SOLN: INTRAVENOUS | Status: DC

## 2021-09-09 MED ORDER — SODIUM CHLORIDE 0.9 % IV SOLN: Freq: Once | INTRAVENOUS | Status: DC

## 2021-09-09 MED ORDER — ONDANSETRON HCL 4 MG/2ML IJ SOLN
INTRAMUSCULAR | Status: DC | PRN
  Administered 2021-09-09: 4 mg via INTRAVENOUS

## 2021-09-09 MED ORDER — SUGAMMADEX SODIUM 500 MG/5ML IV SOLN
INTRAVENOUS | Status: DC | PRN
  Administered 2021-09-09: 275.6 mg via INTRAVENOUS

## 2021-09-09 MED ORDER — FENTANYL CITRATE (PF) 100 MCG/2ML IJ SOLN
INTRAMUSCULAR | Status: DC | PRN
Start: 2021-09-09 — End: 2021-09-09
  Administered 2021-09-09 (×2): 25 ug via INTRAVENOUS

## 2021-09-09 MED ORDER — PROPOFOL 10 MG/ML IV BOLUS
INTRAVENOUS | Status: DC | PRN
  Administered 2021-09-09: 100 mg via INTRAVENOUS

## 2021-09-09 MED ORDER — FENTANYL CITRATE (PF) 100 MCG/2ML IJ SOLN
INTRAMUSCULAR | Status: AC
  Filled 2021-09-09: qty 2

## 2021-09-09 MED ORDER — DEXAMETHASONE SODIUM PHOSPHATE 10 MG/ML IJ SOLN
INTRAMUSCULAR | Status: DC | PRN
  Administered 2021-09-09: 5 mg via INTRAVENOUS

## 2021-09-09 NOTE — Anesthesia Procedure Notes (Signed)
Procedure Name: Intubation ?Date/Time: 09/09/2021 12:55 PM ?Performed by: Nelda Marseille, CRNA ?Pre-anesthesia Checklist: Patient identified, Patient being monitored, Timeout performed, Emergency Drugs available and Suction available ?Patient Re-evaluated:Patient Re-evaluated prior to induction ?Oxygen Delivery Method: Circle system utilized ?Preoxygenation: Pre-oxygenation with 100% oxygen ?Induction Type: IV induction ?Ventilation: Mask ventilation without difficulty ?Laryngoscope Size: Mac and 3 ?Grade View: Grade I ?Tube type: Oral ?Tube size: 9.0 mm ?Number of attempts: 1 ?Airway Equipment and Method: Stylet ?Placement Confirmation: ETT inserted through vocal cords under direct vision, positive ETCO2 and breath sounds checked- equal and bilateral ?Secured at: 21 cm ?Tube secured with: Tape ?Dental Injury: Teeth and Oropharynx as per pre-operative assessment  ? ? ? ? ?

## 2021-09-09 NOTE — Op Note (Signed)
PROCEDURE(S):  ?BRONCHOSCOPY WITH SPRAY CRYOTHERAPY ?TUMOR DESTRUCTION AND ABLATION ?  ?  ?  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  ?  ?Benefits, limitations and potential complications of the procedure were discussed with the patient/family.  Complications from bronchoscopy are rare and most often minor, but if they occur they may include breathing difficulty, vocal cord spasm, hoarseness, slight fever, vomiting, dizziness, bronchospasm, infection, low blood oxygen, bleeding from biopsy site, or an allergic reaction to medications.  It is uncommon for patients to experience other more serious complications for example: Collapsed lung requiring chest tube placement, respiratory failure, heart attack and/or cardiac arrhythmia.  Patient agreed to proceed. ?  ?Surgeon: Renold Don, MD ?Assistant/Scrub: Liborio Nixon, RRT ?Circulator: Annia Belt, RRT ?Anesthesiologist/CRNA: Marline Backbone, MD/Mark Neldon Mc, CRNA ?Representatives: N/A ?  ?  ?Description of the procedure: ?The patient was taken to Procedure Room 2 (Bronchoscopy Suite) in the OR area, where appropriate timeout was taken with the staff.  Patient was inducted under general anesthesia by the anesthesia team and intubated with a #9.0 ET tube.  A Portex adapter was placed on the endotracheal tube flange.  Once the patient was under  general anesthesia, the Olympus therapeutic video bronchoscope was advanced via the Portex adapter.  The airways were examined.  The visible trachea was normal, carina was sharp, the right mainstem bronchus was without any lesions or secretions.  Right upper lobe subsegments were without lesions or inspissated secretions.  Right middle lobe and right lower lobe subsegments were normal. Bronchoscope was then brought to the left mainstem  bronchus where the suture line to the left upper lobe was intact (prior left upper lobectomy).  The previously noted lobulated complex mass was not present.  The area of residual mass in the superior segment of the left lower lobe also appeared to have regressed significantly to almost nonexistent (see photos).  There was some minimal studding of the mucosa noted where the prior masses had been resected and subjected to cryotherapy.  At this point it was elected to treat the mucosa of this area with spray cryotherapy to complete ablation of potential tumor.  Utilizing the truFreeze catheter this was inserted through the working channel of the bronchoscope, the patient was disconnected from the anesthesia circuit, balloon on the ET tube was deflated, breath-hold was initiated and at this point the mucosa in the left main was treated with a 5-second freeze to the area.  The chest was monitored for potential rise during the procedure by the circulator.  Once this spray cryotherapy was done, the patient was then placed back on the circuit and allowed to ventilate.  Patient was allowed to equilibrate.  This freezing procedure was repeated x 2 more times total of 3 sessions.  Inspection of the lumen after the freezing cycles showed no overt trauma to the airway. Once the procedure was completed and adequate hemostasis was confirmed, the patient received 10 mL of 1% lidocaine via bronchial lavage and the bronchoscope was retrieved and the procedure terminated.  The patient was then allowed to to emerge from anesthesia and was extubated in the procedure room without difficulty.  Patient was transferred to the PACU in satisfactory and stable condition. ? ? ?TECHNICAL PROCEDURES: (Place X beside choice below) ?  Procedures  Description  ?  None   ?  Electrocautery   ?  Cryotherapy   ?  Balloon Dilatation   ?  Bronchography   ?  Stent Placement   ?  Therapeutic Aspiration   ?X Spray cryotherapy X 3 cycles  ? Brachytherapy  Catheter Placement   ? Foreign Body Removal    ? ? ? ? ?SPECIMENS (Sites): (Place X beside choice below) ? Specimens Description  ?X No Specimens Obtained Patient has had multiple biopsies previous  ? Washings   ? Lavage   ? Biopsies   ? Fine Needle Aspirates   ? Brushings   ? Sputum   ? ?FINDINGS:  ?Left mainstem to lower lobe bronchi, patent: ? ? ?Left mainstem narrowing no residual mass, just minimal mucosal change after prior cryotherapy (circled lesion).  There are also areas of mucosal studding as noted on the brackets on photo below: ? ? ?Closer look to the area that previously had lobulated mass now just some flat mucosal changes: ? ? ?Same area post cryotherapy: ? ? ?Distal lower lobe bronchi showing no mucosal abnormality after prior cryotherapy on 12 August 2021: ? ? ?ESTIMATED BLOOD LOSS: none ? ?COMPLICATIONS/RESOLUTION: No complications noted. ? ? ?IMPRESSION:POST-PROCEDURE DX:  ?Minimal residual neuroendocrine tumor (atypical carcinoid) on left mainstem ?Post successful cryoablation of residual mucosal studding ?Continue lanreotide as per oncology ? ? ?RECOMMENDATION/PLAN:  ?Patient will need repeat bronchoscopy in 3 to 4 weeks to ensure results of cryotherapy ?Keep pulmonary and oncology appointments ? ?C. Derrill Kay, MD ?Advanced Bronchoscopy ?P[facility] ? ? ? ?*This note was dictated using voice recognition software/Dragon.  Despite best efforts to proofread, errors can occur which can change the meaning. Any transcriptional errors that result from this process are unintentional and may not be fully corrected at the time of dictation. ? ?

## 2021-09-09 NOTE — Discharge Instructions (Addendum)
Keep taking your medications as previous. ? ?AMBULATORY SURGERY  ?DISCHARGE INSTRUCTIONS ? ? ?The drugs that you were given will stay in your system until tomorrow so for the next 24 hours you should not: ? ?Drive an automobile ?Make any legal decisions ?Drink any alcoholic beverage ? ? ?You may resume regular meals tomorrow.  Today it is better to start with liquids and gradually work up to solid foods. ? ?You may eat anything you prefer, but it is better to start with liquids, then soup and crackers, and gradually work up to solid foods. ? ? ?Please notify your doctor immediately if you have any unusual bleeding, trouble breathing, redness and pain at the surgery site, drainage, fever, or pain not relieved by medication. ? ? ? ?Additional Instructions: ? ? ?Please contact your physician with any problems or Same Day Surgery at [phone], Monday through Friday 6 am to 4 pm, or [place] at [facility] number at [phone]. ?

## 2021-09-09 NOTE — Interval H&P Note (Signed)
Patient presents for cryotherapy and tumor ablation/destruction of recurrent carcinoid tumor of the LEFT mainstem bronchus. ? ?Benefits, limitations and potential complications of the procedure were discussed with the patient/family.  Complications from bronchoscopy are rare and most often minor, but if they occur they may include breathing difficulty, vocal cord spasm, hoarseness, slight fever, vomiting, dizziness, bronchospasm, infection, low blood oxygen, bleeding from biopsy site, or an allergic reaction to medications.  It is uncommon for patients to experience other more serious complications for example: Collapsed lung requiring chest tube placement, respiratory failure, heart attack and/or cardiac arrhythmia.  Patient understands that the procedure will be done under general anesthesia.  He agrees to proceed. ? ?C. Derrill Kay, MD ?Advanced Bronchoscopy ?PCCM [facility] ? ? ? ?

## 2021-09-09 NOTE — Transfer of Care (Signed)
Immediate Anesthesia Transfer of Care Note ? ?Patient: Jason Malone ? ?Procedure(s) Performed: VIDEO BRONCHOSCOPY WITHOUT FLUORO (Left) ?CRYOTHERAPY WITH TUMOR DEBULKING ? ?Patient Location: PACU ? ?Anesthesia Type:General ? ?Level of Consciousness: awake, alert  and oriented ? ?Airway & Oxygen Therapy: Patient Spontanous Breathing and Patient connected to face mask oxygen ? ?Post-op Assessment: Report given to RN and Post -op Vital signs reviewed and stable ? ?Post vital signs: Reviewed and stable ? ?Last Vitals:  ?Vitals Value Taken Time  ?BP 134/65 09/09/21 1330  ?Temp 36.2 ?C 09/09/21 1330  ?Pulse 59 09/09/21 1341  ?Resp 12 09/09/21 1341  ?SpO2 91 % 09/09/21 1341  ?Vitals shown include unvalidated device data. ? ?Last Pain:  ?Vitals:  ? 09/09/21 1330  ?TempSrc:   ?PainSc: 0-No pain  ?   ? ?  ? ?Complications: No notable events documented. ?

## 2021-09-09 NOTE — Anesthesia Preprocedure Evaluation (Signed)
Anesthesia Evaluation  ?Patient identified by MRN, date of birth, ID band ?Patient awake ? ? ? ?Reviewed: ?Allergy & Precautions, NPO status , Patient's Chart, lab work & pertinent test results ? ?Airway ?Mallampati: II ? ?TM Distance: >3 FB ?Neck ROM: full ? ? ? Dental ? ?(+) Edentulous Upper, Edentulous Lower ?  ?Pulmonary ?neg pulmonary ROS, Patient abstained from smoking., former smoker,  ?  ?Pulmonary exam normal ? ?+ decreased breath sounds ? ? ? ? ? Cardiovascular ?Exercise Tolerance: Good ?hypertension, Pt. on medications ?+ CAD  ?negative cardio ROS ?Normal cardiovascular exam+ dysrhythmias Atrial Fibrillation  ?Rhythm:Regular Rate:Normal ? ? ?  ?Neuro/Psych ?negative neurological ROS ? negative psych ROS  ? GI/Hepatic ?negative GI ROS, Neg liver ROS,   ?Endo/Other  ?negative endocrine ROSdiabetes, Type 2 ? Renal/GU ?  ?negative genitourinary ?  ?Musculoskeletal ? ? Abdominal ?Normal abdominal exam  (+)   ?Peds ?negative pediatric ROS ?(+)  Hematology ?negative hematology ROS ?(+)   ?Anesthesia Other Findings ?Past Medical History: ?No date: Allergic rhinitis ?No date: Benign prostatic hypertrophy with lower urinary tract  ?symptoms (LUTS) ?No date: Chest pain ?    Comment:  a. 05/2013 MV: EF 70%, no ischemia. ?No date: COPD (chronic obstructive pulmonary disease) ([place]) ?No date: Diverticulitis ?    Comment:  DIVERTICULOSIS ?No date: Dysrhythmia ?No date: Enlarged prostate ?No date: Essential hypertension ?    Comment:  CONTROLLED ON MEDS ?No date: Full dentures ?No date: H/O hypokalemia ?08/28/2016: 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 ?No date: Hyperlipemia ?No date: Hypomagnesemia ?No date: IFG (impaired fasting glucose) ?12/08/2016: Liver cancer ([place]) ?08/28/2016: Liver mass, left lobe ?    Comment:  Probably hemangioma; will get MRI of liver, refer to GI ?No date: Lung cancer [facility]) ?    Comment:  a. carcinoid, left lung, Stage 1b (T2a, N0, cM0);  b.  ?              05/2013 s/p VATS & LULobectomy. ?No date: Monocytosis ?No date: Osteopenia ?No date: Paroxysmal atrial flutter ([place]) ?    Comment:  a. 03/2013->no recurrence;  b. CHA2DS2VASc = 2-->not  ?             currently on anticoagulation;  c. 05/2012 Echo: EF 55-60%, ?             normal RV. ?No date: Pre-diabetes ?No date: Wears glasses ? ?Past Surgical History: ?No date: BRONCHOSCOPY ?    Comment:  04/06/2013 ?05/2013: CARDIOVASCULAR STRESS TEST ?    Comment:  a. No evidence of ischemia or infarct, EF 70%, no WMAs ?07/14/2015: CATARACT EXTRACTION W/PHACO; Left ?    Comment:  Procedure: CATARACT EXTRACTION PHACO AND INTRAOCULAR  ?             LENS PLACEMENT ([place]);  Surgeon: [name], MD;  ?             Location: [place];  Service: Ophthalmology;   ?             Laterality: Left; ?10/01/2015: CATARACT EXTRACTION W/PHACO; Right ?    Comment:  Procedure: CATARACT EXTRACTION PHACO AND INTRAOCULAR  ?             LENS PLACEMENT ([place]);  Surgeon: [name], MD;  ?             Location: [place];  Service: Ophthalmology;   ?             Laterality:  Right; ?No date: COLONOSCOPY W/ POLYPECTOMY ?    Comment:  bleed after-had to go to surgery to stop bleeding via  ?             colonoscopy ?11/19/2015: COLONOSCOPY WITH PROPOFOL; N/A ?    Comment:  Procedure: COLONOSCOPY WITH PROPOFOL;  Surgeon: Dellis Filbert  ?             Amedeo Kinsman, MD;  Location: [facility];  Service:  ?             Endoscopy;  Laterality: N/A; ?12/15/2011: DUPUYTREN CONTRACTURE RELEASE ?    Comment:  Procedure: DUPUYTREN CONTRACTURE RELEASE;  Surgeon: Dominica Severin ?             Beola Cord, MD;  Location: [place];   ?             Service: Orthopedics;  Laterality: Left;  Fasciotomy left ?             ring finger dupuytrens ?09/19/2019: ELECTROMAGNETIC NAVIGATION BROCHOSCOPY; Left ?    Comment:  Procedure: ELECTROMAGNETIC NAVIGATION BRONCHOSCOPY;   ?             Surgeon: Tyler Pita, MD;  Location: [facility];   ?              Service: Cardiopulmonary;  Laterality: Left; ?11/07/2018: FASCIECTOMY; Right ?    Comment:  Procedure: SEGMENTAL FASCIECTOMY RIGHT RING FINGER;   ?             Surgeon: Daryll Brod, MD;  Location: [facility]  ?             CENTER;  Service: Orthopedics;  Laterality: Right;   ?             AXILLARY BLOCK ?11/26/2019: FLEXIBLE BRONCHOSCOPY; Left ?    Comment:  Procedure: FLEXIBLE BRONCHOSCOPY WITH CRYO THERAPY;   ?             Surgeon: Tyler Pita, MD;  Location: [facility];   ?             Service: Cardiopulmonary;  Laterality: Left; ?07/26/2017: IR ANGIOGRAM SELECTIVE EACH ADDITIONAL VESSEL ?07/26/2017: IR ANGIOGRAM SELECTIVE EACH ADDITIONAL VESSEL ?07/26/2017: IR ANGIOGRAM SELECTIVE EACH ADDITIONAL VESSEL ?07/26/2017: IR ANGIOGRAM SELECTIVE EACH ADDITIONAL VESSEL ?07/26/2017: IR ANGIOGRAM SELECTIVE EACH ADDITIONAL VESSEL ?08/11/2017: IR ANGIOGRAM SELECTIVE EACH ADDITIONAL VESSEL ?04/28/2018: IR ANGIOGRAM SELECTIVE EACH ADDITIONAL VESSEL ?07/26/2017: IR ANGIOGRAM VISCERAL SELECTIVE ?08/11/2017: IR ANGIOGRAM VISCERAL SELECTIVE ?04/28/2018: IR ANGIOGRAM VISCERAL SELECTIVE ?07/26/2017: IR EMBO ARTERIAL NOT HEMORR HEMANG INC GUIDE ROADMAPPING ?08/11/2017: IR EMBO TUMOR ORGAN ISCHEMIA INFARCT INC GUIDE ROADMAPPING ?04/28/2018: IR EMBO TUMOR ORGAN ISCHEMIA INFARCT INC GUIDE ROADMAPPING ?07/07/2017: IR RADIOLOGIST EVAL & MGMT ?09/07/2017: IR RADIOLOGIST EVAL & MGMT ?12/21/2017: IR RADIOLOGIST EVAL & MGMT ?03/22/2018: IR RADIOLOGIST EVAL & MGMT ?06/08/2018: IR RADIOLOGIST EVAL & MGMT ?11/01/2018: IR RADIOLOGIST EVAL & MGMT ?03/21/2019: IR RADIOLOGIST EVAL & MGMT ?11/08/2019: IR RADIOLOGIST EVAL & MGMT ?07/01/2020: IR RADIOLOGIST EVAL & MGMT ?12/18/2020: IR RADIOLOGIST EVAL & MGMT ?06/18/2021: IR RADIOLOGIST EVAL & MGMT ?07/26/2017: IR US GUIDE VASC ACCESS RIGHT ?08/11/2017: IR US GUIDE VASC ACCESS RIGHT ?04/28/2018: IR US GUIDE VASC ACCESS RIGHT ?06/10/13: LUNG LOBECTOMY ?    Comment:  upper left lung ?No date: MULTIPLE TOOTH  EXTRACTIONS ?11/17/2015: REMOVAL RETAINED LENS; Right ?    Comment:  Procedure: REMOVAL RETAINED LENS FROAGMENTS RIGHT EYE;   ?  Surgeon: [name], MD;  [facility]: [facility]  ?             S[facility];  Service: Ophthalmology;  Laterality:  ?             Right; ?No date: TONSILLECTOMY ?11-03-15: UPPER GASTROINTESTINAL ENDOSCOPY ?    Comment:  Dr [name] ?No date: VASECTOMY ?06/04/2013: VIDEO ASSISTED THORACOSCOPY (VATS)/WEDGE RESECTION; Left ?    Comment:  Procedure: VIDEO ASSISTED THORACOSCOPY (VATS)/WEDGE  ?             RESECTION;  Surgeon: [name], MD;  Location: [place] ?             OR;  Service: Thoracic;  Laterality: Left; ?06/04/2013: VIDEO BRONCHOSCOPY; N/A ?    Comment:  Procedure: VIDEO BRONCHOSCOPY;  Surgeon: Lilia Argue  ?             Servando Snare, MD;  Location: [place];  Service: Thoracic;   ?             Laterality: N/A; ?08/12/2021: VIDEO BRONCHOSCOPY; Left ?    Comment:  Procedure: VIDEO BRONCHOSCOPY WITHOUT FLUORO;  Surgeon:  ?             Tyler Pita, MD;  Location: [facility];  Service:  ?             Cardiopulmonary;  Laterality: Left; ? ?BMI   ? Body Mass Index: 26.09 kg/m?  ?  ? ? Reproductive/Obstetrics ?negative OB ROS ? ?  ? ? ? ? ? ? ? ? ? ? ? ? ? ?  ?  ? ? ? ? ? ? ? ? ?Anesthesia Physical ?Anesthesia Plan ? ?ASA: 3 ? ?Anesthesia Plan: General  ? ?Post-op Pain Management:   ? ?Induction: Intravenous ? ?PONV Risk Score and Plan: Ondansetron, Dexamethasone, Midazolam and Treatment may vary due to age or medical condition ? ?Airway Management Planned: Oral ETT ? ?Additional Equipment:  ? ?Intra-op Plan:  ? ?Post-operative Plan: Extubation in OR ? ?Informed Consent: I have reviewed the patients History and Physical, chart, labs and discussed the procedure including the risks, benefits and alternatives for the proposed anesthesia with the patient or authorized representative who has indicated his/her understanding and acceptance.  ? ? ? ?Dental Advisory Given ? ?Plan Discussed  with: CRNA and Surgeon ? ?Anesthesia Plan Comments:   ? ? ? ? ? ? ?Anesthesia Quick Evaluation ? ?

## 2021-09-09 NOTE — H&P (Signed)
Jason Malone is an 81 y.o. male.   ? ?Patient Care Team: ?Teodora Medici, DO as PCP - General (Internal Medicine) ?Minna Merritts, MD as PCP - Cardiology (Cardiology) ?[name], MD (General Surgery) ?Nestor Lewandowsky, MD (Inactive) as Referring Physician (Cardiothoracic Surgery) ?Minna Merritts, MD as Consulting Physician (Cardiology) ?[name], MD (Inactive) as Consulting Physician (Cardiothoracic Surgery) ?Lloyd Huger, MD as Consulting Physician (Oncology) ?Arnetha Courser, MD as Attending Physician (Family Medicine) ? ?Chief Complaint: Recurrent carcinoid tumor of the left mainstem bronchus with obstruction ? 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 Recall that the patient underwent resection of his carcinoid in 2015 however, recurrence was noted in August 2018 when liver involvement was noted. In January 2021 he had a severe case of COVID-19 and was admitted to the [facility].  CT scan performed on 11 June 2019 showed typical parenchymal involvement of COVID-19 on the right lung.  The left lung had a lobulated mass in the left mainstem bronchus that appeared to be arising from the suture line from prior resection.  He has undergone cryoablation to this area on 3 separate occasions namely 19 Sep 2019, 10 October 2019 and finally on 26 November 2019.  On these occasions the mass was debulked and significantly reduced in size.  Minimal residual was noted at the last session.  Biopsies were consistent with neuroendocrine carcinoma (carcinoid).  He had a CT chest abdomen and pelvis performed on 12 December 2020 that showed that he may have recurrence at this suture line.  However at the time it was not obstructing the bronchus and the patient had not noticed any significant  shortness of breath over his baseline.  He underwent chest abdomen and pelvis CT with contrast on 3 February and the nodule or soft tissue of the anterior aspect of the left mainstem bronchus has continued to increase. He was scheduled for bronchoscopy on 20 February at 12:30 PM however, patient had another episode of COVID on 17 February and this had to be postponed.  He was noted to have current symptoms of dyspnea and bronchoscopy was then repeated on 12 August 2021.  This showed recurrent carcinoid totally occluding the left mainstem bronchus.  He had tumor debulking at that time as well as spray cryotherapy.  There was residual tumor noted which will have to be addressed at a later time.  Since the procedure on 5 April, he has noted that his dyspnea has improved significantly.  He has not had any fevers chills or sweats since his most recent bout with COVID.  He has had no chest pain.  No lower extremity edema.  No orthopnea or paroxysmal nocturnal dyspnea.  Does not endorse any other symptoms. ? ?He presents today for continued cryotherapy and tumor destruction/debulking on the left mainstem bronchus. ? ?Past Medical History:  ?Diagnosis Date  ? Allergic rhinitis   ? Benign prostatic hypertrophy with lower urinary tract symptoms (LUTS)   ? Chest pain   ? a. 05/2013 MV: EF 70%, no ischemia.  ? COPD (chronic obstructive pulmonary disease) ([place])   ? Diverticulitis   ? DIVERTICULOSIS  ? Dysrhythmia   ? Enlarged prostate   ? Essential hypertension   ? CONTROLLED ON MEDS  ? Full dentures   ? H/O hypokalemia   ?  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 08/28/2016  ? Hyperlipemia   ? Hypomagnesemia   ? IFG (impaired fasting glucose)   ? Liver cancer ([place]) 12/08/2016  ? Liver mass, left lobe 08/28/2016  ? Probably hemangioma; will get MRI of liver, refer to GI  ? Lung cancer ([place])   ? a. carcinoid, left lung, Stage 1b (T2a, N0, cM0);  b. 05/2013 s/p VATS & LULobectomy.  ? Monocytosis   ? Osteopenia   ?  Paroxysmal atrial flutter ([place])   ? a. 03/2013->no recurrence;  b. CHA2DS2VASc = 2-->not currently on anticoagulation;  c. 05/2012 Echo: EF 55-60%, normal RV.  ? Pre-diabetes   ? Wears glasses   ? ? ?Past Surgical History:  ?Procedure Laterality Date  ? BRONCHOSCOPY    ? 04/06/2013  ? CARDIOVASCULAR STRESS TEST  05/2013  ? a. No evidence of ischemia or infarct, EF 70%, no WMAs  ? CATARACT EXTRACTION W/PHACO Left 07/14/2015  ? Procedure: CATARACT EXTRACTION PHACO AND INTRAOCULAR LENS PLACEMENT (IOC);  Surgeon: [name], MD;  Location: [place];  Service: Ophthalmology;  Laterality: Left;  ? CATARACT EXTRACTION W/PHACO Right 10/01/2015  ? Procedure: CATARACT EXTRACTION PHACO AND INTRAOCULAR LENS PLACEMENT (IOC);  Surgeon: [name], MD;  Location: [place];  Service: Ophthalmology;  Laterality: Right;  ? COLONOSCOPY W/ POLYPECTOMY    ? bleed after-had to go to surgery to stop bleeding via colonoscopy  ? COLONOSCOPY WITH PROPOFOL N/A 11/19/2015  ? Procedure: COLONOSCOPY WITH PROPOFOL;  Surgeon: [name], MD;  Location: [facility] ENDOSCOPY;  Service: Endoscopy;  Laterality: N/A;  ? DUPUYTREN CONTRACTURE RELEASE  12/15/2011  ? Procedure: DUPUYTREN CONTRACTURE RELEASE;  Surgeon: [name], MD;  Location: [place];  Service: Orthopedics;  Laterality: Left;  Fasciotomy left ring finger dupuytrens  ? ELECTROMAGNETIC NAVIGATION BROCHOSCOPY Left 09/19/2019  ? Procedure: ELECTROMAGNETIC NAVIGATION BRONCHOSCOPY;  Surgeon: Tyler Pita, MD;  Location: [facility];  Service: Cardiopulmonary;  Laterality: Left;  ? FASCIECTOMY Right 11/07/2018  ? Procedure: SEGMENTAL FASCIECTOMY RIGHT RING FINGER;  Surgeon: Daryll Brod, MD;  Location: [place];  Service: Orthopedics;  Laterality: Right;  AXILLARY BLOCK  ? FLEXIBLE BRONCHOSCOPY Left 11/26/2019  ? Procedure: FLEXIBLE BRONCHOSCOPY WITH CRYO THERAPY;  Surgeon: Tyler Pita, MD;  Location: [facility];   Service: Cardiopulmonary;  Laterality: Left;  ? IR ANGIOGRAM SELECTIVE EACH ADDITIONAL VESSEL  07/26/2017  ? IR ANGIOGRAM SELECTIVE EACH ADDITIONAL VESSEL  07/26/2017  ? IR ANGIOGRAM SELECTIVE EACH ADDITIONAL VESSEL  07/26/2017  ? IR ANGIOGRAM SELECTIVE EACH ADDITIONAL VESSEL  07/26/2017  ? IR ANGIOGRAM SELECTIVE EACH ADDITIONAL VESSEL  07/26/2017  ? IR ANGIOGRAM SELECTIVE EACH ADDITIONAL VESSEL  08/11/2017  ? IR ANGIOGRAM SELECTIVE EACH ADDITIONAL VESSEL  04/28/2018  ? IR ANGIOGRAM VISCERAL SELECTIVE  07/26/2017  ? IR ANGIOGRAM VISCERAL SELECTIVE  08/11/2017  ? IR ANGIOGRAM VISCERAL SELECTIVE  04/28/2018  ? IR EMBO ARTERIAL NOT HEMORR HEMANG INC GUIDE ROADMAPPING  07/26/2017  ? IR EMBO TUMOR ORGAN ISCHEMIA INFARCT INC GUIDE ROADMAPPING  08/11/2017  ? IR EMBO TUMOR ORGAN ISCHEMIA INFARCT INC GUIDE ROADMAPPING  04/28/2018  ? IR RADIOLOGIST EVAL & MGMT  07/07/2017  ? IR RADIOLOGIST EVAL & MGMT  09/07/2017  ? IR RADIOLOGIST EVAL & MGMT  12/21/2017  ? IR RADIOLOGIST EVAL & MGMT  03/22/2018  ? IR RADIOLOGIST EVAL & MGMT  06/08/2018  ? IR RADIOLOGIST EVAL & MGMT  11/01/2018  ? IR RADIOLOGIST EVAL & MGMT  03/21/2019  ? IR RADIOLOGIST EVAL & MGMT  11/08/2019  ? IR RADIOLOGIST EVAL & MGMT  07/01/2020  ? IR RADIOLOGIST EVAL & MGMT  12/18/2020  ? IR RADIOLOGIST EVAL & MGMT  06/18/2021  ? IR US GUIDE VASC ACCESS RIGHT  07/26/2017  ? IR US GUIDE VASC ACCESS RIGHT  08/11/2017  ? IR US GUIDE VASC ACCESS RIGHT  04/28/2018  ? LUNG LOBECTOMY  06/10/13  ? upper left lung  ? MULTIPLE TOOTH EXTRACTIONS    ? REMOVAL RETAINED LENS Right 11/17/2015  ? Procedure: REMOVAL RETAINED LENS FROAGMENTS RIGHT EYE;  Surgeon: [name], MD;  Location: [place];  Service: Ophthalmology;  Laterality: Right;  ? TONSILLECTOMY    ? UPPER GASTROINTESTINAL ENDOSCOPY  11-03-15  ? Dr [name]  ? VASECTOMY    ? VIDEO ASSISTED THORACOSCOPY (VATS)/WEDGE RESECTION Left 06/04/2013  ? Procedure: VIDEO ASSISTED THORACOSCOPY (VATS)/WEDGE RESECTION;  Surgeon: [name],  MD;  Location: [place];  Service: Thoracic;  Laterality: Left;  ? VIDEO BRONCHOSCOPY N/A 06/04/2013  ? Procedure: VIDEO BRONCHOSCOPY;  Surgeon: [name], MD;  Location: [place];  Service: Thoracic;  Laterality: N/A;

## 2021-09-10 ENCOUNTER — Encounter: Payer: Self-pay | Admitting: Pulmonary Disease

## 2021-09-10 LAB — CHROMOGRANIN A: Chromogranin A (ng/mL): 735.5 ng/mL — ABNORMAL HIGH (ref 0.0–101.8)

## 2021-09-16 ENCOUNTER — Ambulatory Visit (INDEPENDENT_AMBULATORY_CARE_PROVIDER_SITE_OTHER): Payer: Medicare Other | Admitting: Internal Medicine

## 2021-09-16 ENCOUNTER — Encounter: Payer: Self-pay | Admitting: Internal Medicine

## 2021-09-16 VITALS — BP 120/78 | HR 61 | Temp 97.9°F | Resp 18 | Ht 64.0 in | Wt 156.8 lb

## 2021-09-16 DIAGNOSIS — I10 Essential (primary) hypertension: Secondary | ICD-10-CM

## 2021-09-16 DIAGNOSIS — E119 Type 2 diabetes mellitus without complications: Secondary | ICD-10-CM | POA: Diagnosis not present

## 2021-09-16 DIAGNOSIS — M545 Low back pain, unspecified: Secondary | ICD-10-CM

## 2021-09-16 DIAGNOSIS — C7A8 Other malignant neuroendocrine tumors: Secondary | ICD-10-CM

## 2021-09-16 DIAGNOSIS — I4892 Unspecified atrial flutter: Secondary | ICD-10-CM

## 2021-09-16 DIAGNOSIS — G8929 Other chronic pain: Secondary | ICD-10-CM

## 2021-09-16 DIAGNOSIS — J449 Chronic obstructive pulmonary disease, unspecified: Secondary | ICD-10-CM | POA: Diagnosis not present

## 2021-09-16 LAB — POCT GLYCOSYLATED HEMOGLOBIN (HGB A1C): Hemoglobin A1C: 6.5 % — AB (ref 4.0–5.6)

## 2021-09-16 MED ORDER — AMLODIPINE BESYLATE 5 MG PO TABS: 5.0000 mg | ORAL_TABLET | Freq: Every day | ORAL | 0 refills | Status: DC

## 2021-09-16 MED ORDER — AMLODIPINE BESYLATE 2.5 MG PO TABS: 2.5000 mg | ORAL_TABLET | Freq: Every day | ORAL | 0 refills | Status: DC

## 2021-09-16 NOTE — Assessment & Plan Note (Signed)
Complaining of mid thoracic and lumbar back pain while doing work in his backyard.  We did review the CT scan from February which did show metastatic osseous lesions.  He is not taking any medication for the back pain and is not interested in starting anything.  We did discuss trying lidocaine patches, which he is not interested in at this point. ?

## 2021-09-16 NOTE — Progress Notes (Signed)
? ?Established Patient Office Visit ? ?Subjective:  ?Patient ID: Jason Malone, male    DOB: March 19, 1941  Age: 81 y.o. MRN: [id number] ? ?CC:  ?Chief Complaint  ?Patient presents with  ? Follow-up  ? Hyperlipidemia  ? Hypertension  ? ? ?HPI ?Jason Malone presents for follow-up on blood pressure.  ? ?Hypertension/A.Flutter: ?-Medications: Losartan 100, amlodipine 7.5 mg  ?-Patient is compliant with above medications and reports no side effects other than  mild BLE non-pitting edema, worse in the evenings.  ?-Checking BP at home (average): 130/70-80 ?-Denies any SOB, CP, vision changes or symptoms of hypotension. ? ?HLD: ?-Medications: Lipitor 40  ?-Patient is compliant with above medications and reports no side effects.  ?-Last lipid panel: Lipid Panel  ?   ?Component Value Date/Time  ? CHOL 83 06/15/2019 0114  ? CHOL 133 02/14/2017 1046  ? TRIG 44 06/15/2019 0114  ? HDL 32 (L) 06/15/2019 0114  ? HDL 39 (L) 02/14/2017 1046  ? CHOLHDL 2.6 06/15/2019 0114  ? VLDL 9 06/15/2019 0114  ? [place] 42 06/15/2019 0114  ? [place] 53 04/11/2019 0000  ? LABVLDL 24 02/14/2017 1046  ? ?Pre-Diabetes: ?-Last A1c 6.4% 8/23 ? ?COPD/Metastatic Lung Cancer/Post-inflammatory Lung Fibrosis: ?-Following with Pulmonology and IR ?-Most recent CT 06/12/21 showing interval enlargement of nodular soft tissue of left mainstem bronchus, unchanged appearance of liver, and multiple unchanged sclerotis osseous metastatic lesions.  ?-Recently underwent second bronchoscopy on 09/09/2021 by pulmonology for recurrent carcinoid tumor of the left mainstem bronchus with obstruction.  He is to have a repeat bronchoscopy in June and he has an appointment with his medical oncologist, Dr. Grayland Ormond on 10/06/2021 to discuss further treatment options.  ? ?He does have an acute complaint of midthoracic back pain today.  He states that his back is only bad whenever he is in his yard with the weed [place].  Does not occur at rest.  He states that he will have to sit down and  rest and after a few minutes the pain will resolve on its own.  He does not have to take any medications for the pain.  He denies numbness or tingling pain from his back or into his legs, he denies sensory changes, loss of bladder or bowel function. ? ?Past Medical History:  ?Diagnosis Date  ? Allergic rhinitis   ? Benign prostatic hypertrophy with lower urinary tract symptoms (LUTS)   ? Chest pain   ? a. 05/2013 MV: EF 70%, no ischemia.  ? COPD (chronic obstructive pulmonary disease) ([place])   ? Diverticulitis   ? DIVERTICULOSIS  ? Dysrhythmia   ? Enlarged prostate   ? Essential hypertension   ? CONTROLLED ON MEDS  ? Full dentures   ? H/O hypokalemia   ? 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 08/28/2016  ? Hyperlipemia   ? Hypomagnesemia   ? IFG (impaired fasting glucose)   ? Liver cancer ([place]) 12/08/2016  ? Liver mass, left lobe 08/28/2016  ? Probably hemangioma; will get MRI of liver, refer to GI  ? Lung cancer ([place])   ? a. carcinoid, left lung, Stage 1b (T2a, N0, cM0);  b. 05/2013 s/p VATS & LULobectomy.  ? Monocytosis   ? Osteopenia   ? Paroxysmal atrial flutter ([place])   ? a. 03/2013->no recurrence;  b. CHA2DS2VASc = 2-->not currently on anticoagulation;  c. 05/2012 Echo: EF 55-60%, normal RV.  ? Pre-diabetes   ? Wears glasses   ? ? ?Past Surgical History:  ?Procedure  Laterality Date  ? BRONCHOSCOPY    ? 04/06/2013  ? CARDIOVASCULAR STRESS TEST  05/2013  ? a. No evidence of ischemia or infarct, EF 70%, no WMAs  ? CATARACT EXTRACTION W/PHACO Left 07/14/2015  ? Procedure: CATARACT EXTRACTION PHACO AND INTRAOCULAR LENS PLACEMENT (IOC);  Surgeon: [name], MD;  Location: [place];  Service: Ophthalmology;  Laterality: Left;  ? CATARACT EXTRACTION W/PHACO Right 10/01/2015  ? Procedure: CATARACT EXTRACTION PHACO AND INTRAOCULAR LENS PLACEMENT (IOC);  Surgeon: [name], MD;  Location: [place];  Service: Ophthalmology;  Laterality: Right;  ? COLONOSCOPY W/ POLYPECTOMY     ? bleed after-had to go to surgery to stop bleeding via colonoscopy  ? COLONOSCOPY WITH PROPOFOL N/A 11/19/2015  ? Procedure: COLONOSCOPY WITH PROPOFOL;  Surgeon: [name], MD;  Location: [facility] ENDOSCOPY;  Service: Endoscopy;  Laterality: N/A;  ? DUPUYTREN CONTRACTURE RELEASE  12/15/2011  ? Procedure: DUPUYTREN CONTRACTURE RELEASE;  Surgeon: [name], MD;  Location: [place];  Service: Orthopedics;  Laterality: Left;  Fasciotomy left ring finger dupuytrens  ? ELECTROMAGNETIC NAVIGATION BROCHOSCOPY Left 09/19/2019  ? Procedure: ELECTROMAGNETIC NAVIGATION BRONCHOSCOPY;  Surgeon: Tyler Pita, MD;  Location: [facility];  Service: Cardiopulmonary;  Laterality: Left;  ? FASCIECTOMY Right 11/07/2018  ? Procedure: SEGMENTAL FASCIECTOMY RIGHT RING FINGER;  Surgeon: Daryll Brod, MD;  Location: [place];  Service: Orthopedics;  Laterality: Right;  AXILLARY BLOCK  ? FLEXIBLE BRONCHOSCOPY Left 11/26/2019  ? Procedure: FLEXIBLE BRONCHOSCOPY WITH CRYO THERAPY;  Surgeon: Tyler Pita, MD;  Location: [facility];  Service: Cardiopulmonary;  Laterality: Left;  ? IR ANGIOGRAM SELECTIVE EACH ADDITIONAL VESSEL  07/26/2017  ? IR ANGIOGRAM SELECTIVE EACH ADDITIONAL VESSEL  07/26/2017  ? IR ANGIOGRAM SELECTIVE EACH ADDITIONAL VESSEL  07/26/2017  ? IR ANGIOGRAM SELECTIVE EACH ADDITIONAL VESSEL  07/26/2017  ? IR ANGIOGRAM SELECTIVE EACH ADDITIONAL VESSEL  07/26/2017  ? IR ANGIOGRAM SELECTIVE EACH ADDITIONAL VESSEL  08/11/2017  ? IR ANGIOGRAM SELECTIVE EACH ADDITIONAL VESSEL  04/28/2018  ? IR ANGIOGRAM VISCERAL SELECTIVE  07/26/2017  ? IR ANGIOGRAM VISCERAL SELECTIVE  08/11/2017  ? IR ANGIOGRAM VISCERAL SELECTIVE  04/28/2018  ? IR EMBO ARTERIAL NOT HEMORR HEMANG INC GUIDE ROADMAPPING  07/26/2017  ? IR EMBO TUMOR ORGAN ISCHEMIA INFARCT INC GUIDE ROADMAPPING  08/11/2017  ? IR EMBO TUMOR ORGAN ISCHEMIA INFARCT INC GUIDE ROADMAPPING  04/28/2018  ? IR RADIOLOGIST EVAL & MGMT  07/07/2017  ? IR RADIOLOGIST EVAL &  MGMT  09/07/2017  ? IR RADIOLOGIST EVAL & MGMT  12/21/2017  ? IR RADIOLOGIST EVAL & MGMT  03/22/2018  ? IR RADIOLOGIST EVAL & MGMT  06/08/2018  ? IR RADIOLOGIST EVAL & MGMT  11/01/2018  ? IR RADIOLOGIST EVAL & MGMT  03/21/2019  ? IR RADIOLOGIST EVAL & MGMT  11/08/2019  ? IR RADIOLOGIST EVAL & MGMT  07/01/2020  ? IR RADIOLOGIST EVAL & MGMT  12/18/2020  ? IR RADIOLOGIST EVAL & MGMT  06/18/2021  ? IR US GUIDE VASC ACCESS RIGHT  07/26/2017  ? IR US GUIDE VASC ACCESS RIGHT  08/11/2017  ? IR US GUIDE VASC ACCESS RIGHT  04/28/2018  ? LUNG LOBECTOMY  06/10/13  ? upper left lung  ? MULTIPLE TOOTH EXTRACTIONS    ? REMOVAL RETAINED LENS Right 11/17/2015  ? Procedure: REMOVAL RETAINED LENS FROAGMENTS RIGHT EYE;  Surgeon: [name], MD;  Location: [place];  Service: Ophthalmology;  Laterality: Right;  ? TONSILLECTOMY    ? UPPER GASTROINTESTINAL ENDOSCOPY  11-03-15  ? Dr [name]  ? VASECTOMY    ? VIDEO ASSISTED THORACOSCOPY (VATS)/WEDGE RESECTION Left 06/04/2013  ? Procedure: VIDEO ASSISTED THORACOSCOPY (VATS)/WEDGE RESECTION;  Surgeon: [name], MD;  Location: [place];  Service: Thoracic;  Laterality: Left;  ? VIDEO BRONCHOSCOPY N/A 06/04/2013  ? Procedure: VIDEO BRONCHOSCOPY;  Surgeon: [name], MD;  Location: [place];  Service: Thoracic;  Laterality: N/A;  ? VIDEO BRONCHOSCOPY Left 08/12/2021  ? Procedure: VIDEO BRONCHOSCOPY WITHOUT FLUORO;  Surgeon: Tyler Pita, MD;  Location: [facility];  Service: Cardiopulmonary;  Laterality: Left;  ? VIDEO BRONCHOSCOPY Left 09/09/2021  ? Procedure: VIDEO BRONCHOSCOPY WITHOUT FLUORO;  Surgeon: Tyler Pita, MD;  Location: [facility];  Service: Cardiopulmonary;  Laterality: Left;  ? ? ?Family History  ?Problem Relation Age of Onset  ? Stroke Mother   ? Alzheimer's disease Mother   ? Hypertension Sister   ? Hyperlipidemia Sister   ? Hypertension Brother   ? Hyperlipidemia Brother   ? Diabetes Brother   ? ? ?Social History  ? ?Socioeconomic History  ? Marital status:  Married  ?  Spouse name: [name]  ? Number of children: 3  ? Years of education: Not on file  ? Highest education level: High school graduate  ?Occupational History  ? Not on file  ?Tobacco Use  ? Smoking

## 2021-09-16 NOTE — Assessment & Plan Note (Signed)
Stable.  Regular rhythm on physical exam today. ?

## 2021-09-16 NOTE — Assessment & Plan Note (Signed)
Stable.  Blood pressure at goal today.  Continue losartan 100 mg, amlodipine 7.5 mg daily.  Refills ordered today.  Continue to check blood pressure at home.  Follow-up in 3 months for recheck. ?

## 2021-09-16 NOTE — Assessment & Plan Note (Signed)
Pulmonology and medical oncology.  Reviewed procedure note from 09/09/2021 for second bronchoscopy.  The patient was returning in June for a repeat bronchoscopy and is following with medical oncology at the end of the month. ?

## 2021-09-16 NOTE — Assessment & Plan Note (Addendum)
Overall stable.  Following with pulmonology.  Not currently using any inhalers. ?

## 2021-09-16 NOTE — Assessment & Plan Note (Signed)
Recheck A1c today stable at 6.5.  Continue lifestyle modifications and diet management. ?

## 2021-09-16 NOTE — Patient Instructions (Signed)
It was great seeing you today! ? ?Plan discussed at today's visit: ?-Continue blood pressure medications as you have been, Losartan 100 mg and Amlodipine 7.5 mg, refills sent today ?-Continue to check blood pressure at home ?-If you decide you want to try the Lidocaine patches for your back let me know and I can send them or you can get them over the counter ? ?Follow up in: 3 months  ? ?Take care and let us know if you have any questions or concerns prior to your next visit. ? ?Dr. Rosana Berger ? ?

## 2021-09-28 NOTE — Anesthesia Postprocedure Evaluation (Signed)
Anesthesia Post Note  Patient: Jason Malone  Procedure(s) Performed: VIDEO BRONCHOSCOPY WITHOUT FLUORO (Left) CRYOTHERAPY WITH TUMOR DEBULKING  Patient location during evaluation: PACU Anesthesia Type: General Level of consciousness: awake and oriented Pain management: satisfactory to patient Vital Signs Assessment: post-procedure vital signs reviewed and stable Respiratory status: spontaneous breathing and respiratory function stable Cardiovascular status: stable Anesthetic complications: no   No notable events documented.   Last Vitals:  Vitals:   09/09/21 1400 09/09/21 1409  BP: 131/60 (!) 146/67  Pulse: (!) 58 67  Resp: (!) 9 15  Temp: (!) 36.1 C 36.4 C  SpO2: 92% 92%    Last Pain:  Vitals:   09/10/21 0841  TempSrc:   PainSc: 0-No pain                 VAN STAVEREN,[name]

## 2021-10-06 ENCOUNTER — Inpatient Hospital Stay: Payer: Medicare Other

## 2021-10-06 ENCOUNTER — Inpatient Hospital Stay: Payer: Medicare Other | Admitting: Oncology

## 2021-10-06 DIAGNOSIS — C7A8 Other malignant neuroendocrine tumors: Secondary | ICD-10-CM

## 2021-10-06 DIAGNOSIS — C7B8 Other secondary neuroendocrine tumors: Secondary | ICD-10-CM

## 2021-10-06 LAB — CBC WITH DIFFERENTIAL/PLATELET
Abs Immature Granulocytes: 0.04 10*3/uL (ref 0.00–0.07)
Basophils Absolute: 0.1 10*3/uL (ref 0.0–0.1)
Basophils Relative: 1 %
Eosinophils Absolute: 0.1 10*3/uL (ref 0.0–0.5)
Eosinophils Relative: 1 %
HCT: 45.6 % (ref 39.0–52.0)
Hemoglobin: 15.2 g/dL (ref 13.0–17.0)
Immature Granulocytes: 0 %
Lymphocytes Relative: 9 %
Lymphs Abs: 1.1 10*3/uL (ref 0.7–4.0)
MCH: 30.8 pg (ref 26.0–34.0)
MCHC: 33.3 g/dL (ref 30.0–36.0)
MCV: 92.5 fL (ref 80.0–100.0)
Monocytes Absolute: 1.7 10*3/uL — ABNORMAL HIGH (ref 0.1–1.0)
Monocytes Relative: 14 %
Neutro Abs: 8.6 10*3/uL — ABNORMAL HIGH (ref 1.7–7.7)
Neutrophils Relative %: 75 %
Platelets: 196 10*3/uL (ref 150–400)
RBC: 4.93 MIL/uL (ref 4.22–5.81)
RDW: 14.5 % (ref 11.5–15.5)
WBC: 11.6 10*3/uL — ABNORMAL HIGH (ref 4.0–10.5)
nRBC: 0 % (ref 0.0–0.2)

## 2021-10-06 LAB — COMPREHENSIVE METABOLIC PANEL
ALT: 19 U/L (ref 0–44)
AST: 20 U/L (ref 15–41)
Albumin: 3.9 g/dL (ref 3.5–5.0)
Alkaline Phosphatase: 129 U/L — ABNORMAL HIGH (ref 38–126)
Anion gap: 5 (ref 5–15)
BUN: 21 mg/dL (ref 8–23)
CO2: 29 mmol/L (ref 22–32)
Calcium: 9.3 mg/dL (ref 8.9–10.3)
Chloride: 106 mmol/L (ref 98–111)
Creatinine, Ser: 1.05 mg/dL (ref 0.61–1.24)
GFR, Estimated: >60 mL/min (ref >60)
Glucose, Bld: 151 mg/dL — ABNORMAL HIGH (ref 70–99)
Potassium: 4.5 mmol/L (ref 3.5–5.1)
Sodium: 140 mmol/L (ref 135–145)
Total Bilirubin: 0.4 mg/dL (ref 0.3–1.2)
Total Protein: 6.9 g/dL (ref 6.5–8.1)

## 2021-10-06 MED ORDER — LANREOTIDE ACETATE 120 MG/0.5ML ~~LOC~~ SOLN
120.0000 mg | Freq: Once | SUBCUTANEOUS | Status: AC
  Administered 2021-10-06: 120 mg via SUBCUTANEOUS
  Filled 2021-10-06: qty 120

## 2021-10-08 ENCOUNTER — Encounter: Payer: Self-pay | Admitting: Pulmonary Disease

## 2021-10-08 ENCOUNTER — Ambulatory Visit (INDEPENDENT_AMBULATORY_CARE_PROVIDER_SITE_OTHER): Payer: Medicare Other | Admitting: Pulmonary Disease

## 2021-10-08 ENCOUNTER — Telehealth: Payer: Self-pay | Admitting: Pulmonary Disease

## 2021-10-08 VITALS — BP 116/72 | HR 54 | Temp 97.6°F | Ht 64.0 in | Wt 157.8 lb

## 2021-10-08 DIAGNOSIS — Z8616 Personal history of COVID-19: Secondary | ICD-10-CM | POA: Diagnosis not present

## 2021-10-08 DIAGNOSIS — J8410 Pulmonary fibrosis, unspecified: Secondary | ICD-10-CM | POA: Diagnosis not present

## 2021-10-08 DIAGNOSIS — C7A8 Other malignant neuroendocrine tumors: Secondary | ICD-10-CM | POA: Diagnosis not present

## 2021-10-08 NOTE — Telephone Encounter (Signed)
Regular bronchoscopy with cyro therapy and tumor debulking on standby scheduled 10/14/2021 at 12:30. [id number] tumor of lung [id number]  Rodena Piety, please see bronch info. Thanks

## 2021-10-08 NOTE — H&P (View-Only) (Signed)
Subjective:    Patient ID: Jason Malone, male    DOB: 1941/05/10, 81 y.o.   MRN: [id number] Patient Care Team: Teodora Medici, DO as PCP - General (Internal Medicine) Minna Merritts, MD as PCP - Cardiology (Cardiology) [name], Forest Gleason, MD (General Surgery) Nestor Lewandowsky, MD (Inactive) as Referring Physician (Cardiothoracic Surgery) Minna Merritts, MD as Consulting Physician (Cardiology) [name], MD (Inactive) as Consulting Physician (Cardiothoracic Surgery) Lloyd Huger, MD as Consulting Physician (Oncology) Arnetha Courser, MD as Attending Physician [facility] Medicine)  Chief Complaint  Patient presents with   Follow-up    C/o dry cough mainly with laying flat.    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  The patient underwent resection for carcinoid tumor in 2015 however he had recurrence noted in August 2018 when liver involvement was noted.  In January 2021 he had a severe case of COVID-19 and was admitted due to respiratory failure for the same.  A CT scan performed 11 May 2019 showed typical parenchymal involvement of COVID in the right lung, the left lung had a lobulated mass in the left mainstem bronchus and appeared to be arising from the suture line of the prior resection.  He underwent cryoablation and tumor debulking in 3 separate occasions on 19 Sep 2019, 10 October 2019 and on 26 November 2019.  Mass was debulked and significant reduced in size.  There was minimal residual noted on the last session with cryotherapy performed at that time.  Biopsies confirmed neuroendocrine carcinoma.  He did well until August 2022 when there was noted that there may be some recurrence at the suture line.  However at that time it was not obstructing the bronchus and the patient had not noticed any significant shortness of breath over his baseline.  At  that time he also wanted to postpone procedures.  On 12 June 2021 he had a CT chest with contrast that showed that the nodule or soft tissue in the anterior aspect of the left mainstem bronchus had continued to increase.  He was scheduled for bronchoscopy on 20 February however he had another bout with COVID and this had to be postponed.  He subsequently underwent repeat bronchoscopy on 5 April that showed that he had complete obstruction of his left mainstem bronchus he underwent cryoablation and tumor debulking.  He underwent a second session on 09 Sep 2021 and this time he had only minimal residual noted from the prior procedure.  He underwent cryotherapy and now presents for follow-up of this.  He will need repeat bronchoscopy to ensure that he has good treatment results from his prior cryotherapy.  He has been doing well, he does not endorse any fevers, chills or sweats.  Shortness of breath that was noted prior to his April bronchoscopy has now resolved.  He has no cough.  No sputum production.  No weight loss or anorexia.  Overall he feels well and looks well.  Review of Systems A 10 point review of systems was performed and it is as noted above otherwise negative.  Past Medical History:  Diagnosis Date   Allergic rhinitis    Benign prostatic hypertrophy with lower urinary tract symptoms (LUTS)    Chest pain    a. 05/2013 MV: EF 70%, no ischemia.   COPD (chronic obstructive pulmonary disease) (HCC)    Diverticulitis  DIVERTICULOSIS   Dysrhythmia    Enlarged prostate    Essential hypertension    CONTROLLED ON MEDS   Full dentures    H/O hypokalemia    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 08/28/2016   Hyperlipemia    Hypomagnesemia    IFG (impaired fasting glucose)    Liver cancer ([place]) 12/08/2016   Liver mass, left lobe 08/28/2016   Probably hemangioma; will get MRI of liver, refer to GI   Lung cancer ([place])    a. carcinoid, left lung, Stage 1b (T2a, N0, cM0);   b. 05/2013 s/p VATS & LULobectomy.   Monocytosis    Osteopenia    Paroxysmal atrial flutter (HCC)    a. 03/2013->no recurrence;  b. CHA2DS2VASc = 2-->not currently on anticoagulation;  c. 05/2012 Echo: EF 55-60%, normal RV.   Pre-diabetes    Wears glasses        Objective:   Physical Exam BP 116/72 (BP Location: Left Arm, Cuff Size: Normal)   Pulse (!) 54   Temp 97.6 F (36.4 C) (Temporal)   Ht 5\' 4"  (1.626 m)   Wt 157 lb 12.8 oz (71.6 kg)   SpO2 97%   BMI 27.09 kg/m  GENERAL: Awake and alert, well developed, well nourished gentleman, in no respiratory distress, fully ambulatory, no conversational dyspnea. HEAD: Normocephalic, atraumatic. EYES: Pupils equal, round, reactive to light.  No scleral icterus. MOUTH: Nose/mouth/throat not examined due to masking requirements for COVID 19. NECK: Supple. No thyromegaly. No nodules. No JVD.  Trachea is midline PULMONARY:  Symmetrical and good air entry, slightly diminished breath sounds in the left upper lung zone, no wheezes or rhonchi noted. CARDIOVASCULAR: S1 and S2.    Regular rate and rhythm.  No rubs murmurs or gallops appreciated. GASTROINTESTINAL: Benign. MUSCULOSKELETAL: No joint deformity, no clubbing, no edema. NEUROLOGIC: No focal deficit, no gait disturbance, speech is fluent. SKIN: Intact,warm,dry. No rashes noted on limited exam. PSYCH: Mood and behavior normal.     Assessment & Plan:     ICD-10-CM   1. Neuroendocrine carcinoma of lung ([place]) -stage IVa  C7A.8    Will need rebronchoscopy Follow-up after prior cryoablation Bronchoscopy scheduled for 14 October 2021 12:30 PM    2. Postinflammatory pulmonary fibrosis (HCC)  J84.10    Mostly on right lung Asymptomatic in this regard    3. History of COVID-19  Z86.16    This issue adds complexity to his management     Benefits, limitations and potential complications of the procedure were discussed with the patient/family.  Complications from bronchoscopy are rare and  most often minor, but if they occur they may include breathing difficulty, vocal cord spasm, hoarseness, slight fever, vomiting, dizziness, bronchospasm, infection, low blood oxygen, bleeding from biopsy site, or an allergic reaction to medications.  It is uncommon for patients to experience other more serious complications for example: Collapsed lung requiring chest tube placement, respiratory failure, heart attack and/or cardiac arrhythmia.  Patient agrees to proceed.  We have scheduled repeat bronchoscopy with cryotherapy on standby for 14 October 2021 at 12:30 PM.  We will see the patient in follow-up in 4 to 6 weeks time he is to call sooner should any new difficulties arise.   Renold Don, MD Advanced Bronchoscopy PCCM [facility]    *This note was dictated using voice recognition software/Dragon.  Despite best efforts to proofread, errors can occur which can change the meaning. Any transcriptional errors that result from this process are unintentional  and may not be fully corrected at the time of dictation.

## 2021-10-08 NOTE — Progress Notes (Signed)
Subjective:    Patient ID: Jason Malone, male    DOB: December 11, 1940, 81 y.o.   MRN: [id number] Patient Care Team: Teodora Medici, DO as PCP - General (Internal Medicine) Minna Merritts, MD as PCP - Cardiology (Cardiology) [name], Forest Gleason, MD (General Surgery) Nestor Lewandowsky, MD (Inactive) as Referring Physician (Cardiothoracic Surgery) Minna Merritts, MD as Consulting Physician (Cardiology) [name], MD (Inactive) as Consulting Physician (Cardiothoracic Surgery) Lloyd Huger, MD as Consulting Physician (Oncology) Arnetha Courser, MD as Attending Physician [facility] Medicine)  Chief Complaint  Patient presents with   Follow-up    C/o dry cough mainly with laying flat.    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  The patient underwent resection for carcinoid tumor in 2015 however he had recurrence noted in August 2018 when liver involvement was noted.  In January 2021 he had a severe case of COVID-19 and was admitted due to respiratory failure for the same.  A CT scan performed 11 May 2019 showed typical parenchymal involvement of COVID in the right lung, the left lung had a lobulated mass in the left mainstem bronchus and appeared to be arising from the suture line of the prior resection.  He underwent cryoablation and tumor debulking in 3 separate occasions on 19 Sep 2019, 10 October 2019 and on 26 November 2019.  Mass was debulked and significant reduced in size.  There was minimal residual noted on the last session with cryotherapy performed at that time.  Biopsies confirmed neuroendocrine carcinoma.  He did well until August 2022 when there was noted that there may be some recurrence at the suture line.  However at that time it was not obstructing the bronchus and the patient had not noticed any significant shortness of breath over his baseline.  At  that time he also wanted to postpone procedures.  On 12 June 2021 he had a CT chest with contrast that showed that the nodule or soft tissue in the anterior aspect of the left mainstem bronchus had continued to increase.  He was scheduled for bronchoscopy on 20 February however he had another bout with COVID and this had to be postponed.  He subsequently underwent repeat bronchoscopy on 5 April that showed that he had complete obstruction of his left mainstem bronchus he underwent cryoablation and tumor debulking.  He underwent a second session on 09 Sep 2021 and this time he had only minimal residual noted from the prior procedure.  He underwent cryotherapy and now presents for follow-up of this.  He will need repeat bronchoscopy to ensure that he has good treatment results from his prior cryotherapy.  He has been doing well, he does not endorse any fevers, chills or sweats.  Shortness of breath that was noted prior to his April bronchoscopy has now resolved.  He has no cough.  No sputum production.  No weight loss or anorexia.  Overall he feels well and looks well.  Review of Systems A 10 point review of systems was performed and it is as noted above otherwise negative.  Past Medical History:  Diagnosis Date   Allergic rhinitis    Benign prostatic hypertrophy with lower urinary tract symptoms (LUTS)    Chest pain    a. 05/2013 MV: EF 70%, no ischemia.   COPD (chronic obstructive pulmonary disease) (HCC)    Diverticulitis  DIVERTICULOSIS   Dysrhythmia    Enlarged prostate    Essential hypertension    CONTROLLED ON MEDS   Full dentures    H/O hypokalemia    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 08/28/2016   Hyperlipemia    Hypomagnesemia    IFG (impaired fasting glucose)    Liver cancer ([place]) 12/08/2016   Liver mass, left lobe 08/28/2016   Probably hemangioma; will get MRI of liver, refer to GI   Lung cancer ([place])    a. carcinoid, left lung, Stage 1b (T2a, N0, cM0);   b. 05/2013 s/p VATS & LULobectomy.   Monocytosis    Osteopenia    Paroxysmal atrial flutter (HCC)    a. 03/2013->no recurrence;  b. CHA2DS2VASc = 2-->not currently on anticoagulation;  c. 05/2012 Echo: EF 55-60%, normal RV.   Pre-diabetes    Wears glasses        Objective:   Physical Exam BP 116/72 (BP Location: Left Arm, Cuff Size: Normal)   Pulse (!) 54   Temp 97.6 F (36.4 C) (Temporal)   Ht 5\' 4"  (1.626 m)   Wt 157 lb 12.8 oz (71.6 kg)   SpO2 97%   BMI 27.09 kg/m  GENERAL: Awake and alert, well developed, well nourished gentleman, in no respiratory distress, fully ambulatory, no conversational dyspnea. HEAD: Normocephalic, atraumatic. EYES: Pupils equal, round, reactive to light.  No scleral icterus. MOUTH: Nose/mouth/throat not examined due to masking requirements for COVID 19. NECK: Supple. No thyromegaly. No nodules. No JVD.  Trachea is midline PULMONARY:  Symmetrical and good air entry, slightly diminished breath sounds in the left upper lung zone, no wheezes or rhonchi noted. CARDIOVASCULAR: S1 and S2.    Regular rate and rhythm.  No rubs murmurs or gallops appreciated. GASTROINTESTINAL: Benign. MUSCULOSKELETAL: No joint deformity, no clubbing, no edema. NEUROLOGIC: No focal deficit, no gait disturbance, speech is fluent. SKIN: Intact,warm,dry. No rashes noted on limited exam. PSYCH: Mood and behavior normal.     Assessment & Plan:     ICD-10-CM   1. Neuroendocrine carcinoma of lung ([place]) -stage IVa  C7A.8    Will need rebronchoscopy Follow-up after prior cryoablation Bronchoscopy scheduled for 14 October 2021 12:30 PM    2. Postinflammatory pulmonary fibrosis (HCC)  J84.10    Mostly on right lung Asymptomatic in this regard    3. History of COVID-19  Z86.16    This issue adds complexity to his management     Benefits, limitations and potential complications of the procedure were discussed with the patient/family.  Complications from bronchoscopy are rare and  most often minor, but if they occur they may include breathing difficulty, vocal cord spasm, hoarseness, slight fever, vomiting, dizziness, bronchospasm, infection, low blood oxygen, bleeding from biopsy site, or an allergic reaction to medications.  It is uncommon for patients to experience other more serious complications for example: Collapsed lung requiring chest tube placement, respiratory failure, heart attack and/or cardiac arrhythmia.  Patient agrees to proceed.  We have scheduled repeat bronchoscopy with cryotherapy on standby for 14 October 2021 at 12:30 PM.  We will see the patient in follow-up in 4 to 6 weeks time he is to call sooner should any new difficulties arise.   Renold Don, MD Advanced Bronchoscopy PCCM [facility]    *This note was dictated using voice recognition software/Dragon.  Despite best efforts to proofread, errors can occur which can change the meaning. Any transcriptional errors that result from this process are unintentional  and may not be fully corrected at the time of dictation.

## 2021-10-08 NOTE — Patient Instructions (Signed)
We have scheduled the repeat bronchoscopy on 7 June.  We will have the cryotherapy on standby.  We will see you in follow-up in 4 to 6 weeks time call sooner should any new problems arise.

## 2021-10-08 NOTE — Telephone Encounter (Signed)
Noted.  Will close encounter.  

## 2021-10-08 NOTE — Telephone Encounter (Signed)
For the codes 31628, [phone], [phone] Prior Auth Not Required and is a covered service Refer # [id number]

## 2021-10-08 NOTE — Telephone Encounter (Signed)
Phone pre admit visit 10/12/2021 between 1-5 and covid test 10/12/2021 at 9:15.    Lm for patient.

## 2021-10-09 LAB — CHROMOGRANIN A: Chromogranin A (ng/mL): 876.8 ng/mL — ABNORMAL HIGH (ref 0.0–101.8)

## 2021-10-12 ENCOUNTER — Encounter
Admission: RE | Admit: 2021-10-12 | Discharge: 2021-10-12 | Disposition: A | Discharge status: Home / Self Care | Payer: Medicare Other | Source: Ambulatory Visit | Attending: Pulmonary Disease | Admitting: Pulmonary Disease

## 2021-10-12 DIAGNOSIS — Z1152 Encounter for screening for COVID-19: Secondary | ICD-10-CM

## 2021-10-12 DIAGNOSIS — Z01812 Encounter for preprocedural laboratory examination: Secondary | ICD-10-CM | POA: Diagnosis not present

## 2021-10-12 DIAGNOSIS — Z01818 Encounter for other preprocedural examination: Secondary | ICD-10-CM

## 2021-10-12 DIAGNOSIS — Z20822 Contact with and (suspected) exposure to covid-19: Secondary | ICD-10-CM | POA: Diagnosis not present

## 2021-10-12 HISTORY — DX: Bradycardia, unspecified: R00.1

## 2021-10-12 HISTORY — DX: Atherosclerotic heart disease of native coronary artery without angina pectoris: I25.10

## 2021-10-12 HISTORY — DX: Pneumonia, unspecified organism: J18.9

## 2021-10-12 HISTORY — DX: Chronic respiratory failure with hypoxia: J96.11

## 2021-10-12 HISTORY — DX: Hemorrhage of anus and rectum: K62.5

## 2021-10-12 HISTORY — DX: Atherosclerosis of aorta: I70.0

## 2021-10-12 HISTORY — DX: Other specified abnormal findings of blood chemistry: R79.89

## 2021-10-12 HISTORY — DX: Type 2 diabetes mellitus without complications: E11.9

## 2021-10-12 HISTORY — DX: Pulmonary fibrosis, unspecified: J84.10

## 2021-10-12 HISTORY — DX: Acidosis, unspecified: E87.20

## 2021-10-12 HISTORY — DX: Personal history of nicotine dependence: Z87.891

## 2021-10-12 HISTORY — DX: COVID-19: U07.1

## 2021-10-12 LAB — SARS CORONAVIRUS 2 (TAT 6-24 HRS): SARS Coronavirus 2: NEGATIVE

## 2021-10-12 NOTE — Patient Instructions (Signed)
Your procedure is scheduled on:10-14-21 Wednesday Report to the Registration Desk on the 1st floor of the [facility].Then proceed to the 2nd floor Surgery Desk To find out your arrival time, please call [phone] between 1PM - 3PM on:10-13-21 Tuesday If your arrival time is 6:00 am, do not arrive prior to that time as the [facility] entrance doors do not open until 6:00 am.  REMEMBER: Instructions that are not followed completely may result in serious medical risk, up to and including death; or upon the discretion of your surgeon and anesthesiologist your surgery may need to be rescheduled.  Do not eat food OR drink any liquids after midnight the night before surgery.  No gum chewing, lozengers or hard candies.  TAKE THESE MEDICATIONS THE MORNING OF SURGERY WITH A SIP OF WATER: -amLODipine (NORVASC)  81 mg Aspirin was stopped on 10-07-21   One week prior to surgery: Stop Anti-inflammatories (NSAIDS) such as Advil, Aleve, Ibuprofen, Motrin, Naproxen, Naprosyn and Aspirin based products such as Excedrin, Goodys Powder, BC Powder.You may however, take Tylenol if needed for pain up until the day of surgery.  Stop ANY OVER THE COUNTER supplements/vitamins NOW (10-12-21) until after surgery-All vitamins were stopped on 10-07-21   No Alcohol for 24 hours before or after surgery.  No Smoking including e-cigarettes for 24 hours prior to surgery.  No chewable tobacco products for at least 6 hours prior to surgery.  No nicotine patches on the day of surgery.  Do not use any "recreational" drugs for at least a week prior to your surgery.  Please be advised that the combination of cocaine and anesthesia may have negative outcomes, up to and including death. If you test positive for cocaine, your surgery will be cancelled.  On the morning of surgery brush your teeth with toothpaste and water, you may rinse your mouth with mouthwash if you wish. Do not swallow any toothpaste or mouthwash.  Do not  wear jewelry, make-up, hairpins, clips or nail polish.  Do not wear lotions, powders, or perfumes.   Do not shave body from the neck down 48 hours prior to surgery just in case you cut yourself which could leave a site for infection.  Also, freshly shaved skin may become irritated if using the CHG soap.  Contact lenses, hearing aids and dentures may not be worn into surgery.  Do not bring valuables to the hospital. [facility] is not responsible for any missing/lost belongings or valuables.   Notify your doctor if there is any change in your medical condition (cold, fever, infection).  Wear comfortable clothing (specific to your surgery type) to the hospital.  After surgery, you can help prevent lung complications by doing breathing exercises.  Take deep breaths and cough every 1-2 hours. Your doctor may order a device called an Incentive Spirometer to help you take deep breaths. When coughing or sneezing, hold a pillow firmly against your incision with both hands. This is called "splinting." Doing this helps protect your incision. It also decreases belly discomfort.  If you are being admitted to the hospital overnight, leave your suitcase in the car. After surgery it may be brought to your room.  If you are being discharged the day of surgery, you will not be allowed to drive home. You will need a responsible adult (18 years or older) to drive you home and stay with you that night.   If you are taking public transportation, you will need to have a responsible adult (18 years  or older) with you. Please confirm with your physician that it is acceptable to use public transportation.   Please call the [facility]. at [phone] if you have any questions about these instructions.  Surgery Visitation Policy:  Patients undergoing a surgery or procedure may have two family members or support persons with them as long as the person is not COVID-19 positive or experiencing  its symptoms.

## 2021-10-13 MED ORDER — ORAL CARE MOUTH RINSE: 15.0000 mL | Freq: Once | OROMUCOSAL | Status: AC

## 2021-10-13 MED ORDER — SODIUM CHLORIDE 0.9 % IV SOLN: Freq: Once | INTRAVENOUS | Status: DC

## 2021-10-13 MED ORDER — SODIUM CHLORIDE 0.9 % IV SOLN: INTRAVENOUS | Status: DC

## 2021-10-13 MED ORDER — FAMOTIDINE 20 MG PO TABS
20.0000 mg | ORAL_TABLET | Freq: Once | ORAL | Status: AC
  Administered 2021-10-14: 20 mg via ORAL

## 2021-10-13 MED ORDER — CHLORHEXIDINE GLUCONATE 0.12 % MT SOLN: 15.0000 mL | Freq: Once | OROMUCOSAL | Status: AC

## 2021-10-14 ENCOUNTER — Ambulatory Visit: Payer: Medicare Other

## 2021-10-14 ENCOUNTER — Ambulatory Visit
Admission: RE | Admit: 2021-10-14 | Discharge: 2021-10-14 | Disposition: A | Discharge status: Home / Self Care | Payer: Medicare Other | Source: Ambulatory Visit | Attending: Pulmonary Disease | Admitting: Pulmonary Disease

## 2021-10-14 ENCOUNTER — Ambulatory Visit: Payer: Medicare Other | Admitting: Urgent Care

## 2021-10-14 ENCOUNTER — Encounter: Payer: Self-pay | Admitting: Pulmonary Disease

## 2021-10-14 ENCOUNTER — Encounter
Admission: RE | Disposition: A | Discharge status: Home / Self Care | Payer: Self-pay | Source: Ambulatory Visit | Attending: Pulmonary Disease

## 2021-10-14 ENCOUNTER — Other Ambulatory Visit: Payer: Self-pay

## 2021-10-14 DIAGNOSIS — I2510 Atherosclerotic heart disease of native coronary artery without angina pectoris: Secondary | ICD-10-CM | POA: Insufficient documentation

## 2021-10-14 DIAGNOSIS — Z8616 Personal history of COVID-19: Secondary | ICD-10-CM | POA: Diagnosis not present

## 2021-10-14 DIAGNOSIS — Z01818 Encounter for other preprocedural examination: Secondary | ICD-10-CM

## 2021-10-14 DIAGNOSIS — C7A090 Malignant carcinoid tumor of the bronchus and lung: Secondary | ICD-10-CM | POA: Diagnosis not present

## 2021-10-14 DIAGNOSIS — Z902 Acquired absence of lung [part of]: Secondary | ICD-10-CM | POA: Insufficient documentation

## 2021-10-14 DIAGNOSIS — E119 Type 2 diabetes mellitus without complications: Secondary | ICD-10-CM | POA: Insufficient documentation

## 2021-10-14 DIAGNOSIS — C7A8 Other malignant neuroendocrine tumors: Secondary | ICD-10-CM

## 2021-10-14 DIAGNOSIS — Z87891 Personal history of nicotine dependence: Secondary | ICD-10-CM | POA: Insufficient documentation

## 2021-10-14 DIAGNOSIS — I10 Essential (primary) hypertension: Secondary | ICD-10-CM | POA: Insufficient documentation

## 2021-10-14 DIAGNOSIS — J449 Chronic obstructive pulmonary disease, unspecified: Secondary | ICD-10-CM | POA: Insufficient documentation

## 2021-10-14 DIAGNOSIS — J8410 Pulmonary fibrosis, unspecified: Secondary | ICD-10-CM | POA: Diagnosis not present

## 2021-10-14 HISTORY — PX: FLEXIBLE BRONCHOSCOPY: SHX5094

## 2021-10-14 LAB — GLUCOSE, CAPILLARY
Glucose-Capillary: 120 mg/dL — ABNORMAL HIGH (ref 70–99)
Glucose-Capillary: 110 mg/dL — ABNORMAL HIGH (ref 70–99)

## 2021-10-14 SURGERY — BRONCHOSCOPY, FLEXIBLE
Anesthesia: General | Laterality: Left

## 2021-10-14 MED ORDER — SUGAMMADEX SODIUM 200 MG/2ML IV SOLN
INTRAVENOUS | Status: DC | PRN
  Administered 2021-10-14: 400 mg via INTRAVENOUS

## 2021-10-14 MED ORDER — ONDANSETRON HCL 4 MG/2ML IJ SOLN
INTRAMUSCULAR | Status: DC | PRN
  Administered 2021-10-14: 4 mg via INTRAVENOUS

## 2021-10-14 MED ORDER — ACETAMINOPHEN 10 MG/ML IV SOLN: 1000.0000 mg | Freq: Once | INTRAVENOUS | Status: DC | PRN

## 2021-10-14 MED ORDER — FAMOTIDINE 20 MG PO TABS
ORAL_TABLET | ORAL | Status: AC
  Filled 2021-10-14: qty 1

## 2021-10-14 MED ORDER — FENTANYL CITRATE (PF) 100 MCG/2ML IJ SOLN
INTRAMUSCULAR | Status: DC | PRN
Start: 2021-10-14 — End: 2021-10-14
  Administered 2021-10-14: 50 ug via INTRAVENOUS

## 2021-10-14 MED ORDER — FENTANYL CITRATE (PF) 100 MCG/2ML IJ SOLN
INTRAMUSCULAR | Status: AC
  Filled 2021-10-14: qty 2

## 2021-10-14 MED ORDER — CHLORHEXIDINE GLUCONATE 0.12 % MT SOLN
OROMUCOSAL | Status: AC
  Administered 2021-10-14: 15 mL via OROMUCOSAL
  Filled 2021-10-14: qty 15

## 2021-10-14 MED ORDER — DROPERIDOL 2.5 MG/ML IJ SOLN: 0.6250 mg | Freq: Once | INTRAMUSCULAR | Status: DC | PRN

## 2021-10-14 MED ORDER — LIDOCAINE HCL (CARDIAC) PF 100 MG/5ML IV SOSY
PREFILLED_SYRINGE | INTRAVENOUS | Status: DC | PRN
  Administered 2021-10-14: 80 mg via INTRAVENOUS

## 2021-10-14 MED ORDER — DEXAMETHASONE SODIUM PHOSPHATE 10 MG/ML IJ SOLN
INTRAMUSCULAR | Status: DC | PRN
  Administered 2021-10-14: 10 mg via INTRAVENOUS

## 2021-10-14 MED ORDER — PROPOFOL 10 MG/ML IV BOLUS
INTRAVENOUS | Status: AC
  Filled 2021-10-14: qty 20

## 2021-10-14 MED ORDER — ROCURONIUM BROMIDE 100 MG/10ML IV SOLN
INTRAVENOUS | Status: DC | PRN
  Administered 2021-10-14: 50 mg via INTRAVENOUS

## 2021-10-14 MED ORDER — PROPOFOL 10 MG/ML IV BOLUS
INTRAVENOUS | Status: DC | PRN
  Administered 2021-10-14: 150 mg via INTRAVENOUS

## 2021-10-14 MED ORDER — SUGAMMADEX SODIUM 500 MG/5ML IV SOLN
INTRAVENOUS | Status: AC
  Filled 2021-10-14: qty 5

## 2021-10-14 MED ORDER — PROMETHAZINE HCL 25 MG/ML IJ SOLN: 6.2500 mg | INTRAMUSCULAR | Status: DC | PRN

## 2021-10-14 NOTE — Anesthesia Preprocedure Evaluation (Addendum)
Anesthesia Evaluation  Patient identified by MRN, date of birth, ID band Patient awake    Reviewed: Allergy & Precautions, NPO status , Patient's Chart, lab work & pertinent test results  Airway Mallampati: III  TM Distance: >3 FB Neck ROM: full    Dental  (+) Edentulous Upper, Edentulous Lower   Pulmonary Patient abstained from smoking., former smoker,  LEFT LUNG NODULES  carcinoid, left lung, Stage 1b (T2a, N0, cM0);  b. 05/2013 s/p VATS & LULobectomy.   Pulmonary exam normal        Cardiovascular Exercise Tolerance: Good hypertension, Pt. on medications + CAD  Normal cardiovascular exam+ dysrhythmias Atrial Fibrillation  Rhythm:Regular Rate:Normal     Neuro/Psych negative neurological ROS  negative psych ROS   GI/Hepatic negative GI ROS, Neg liver ROS,   Endo/Other  diabetes, Type 2  Renal/GU   negative genitourinary   Musculoskeletal   Abdominal Normal abdominal exam  (+)   Peds negative pediatric ROS (+)  Hematology negative hematology ROS (+)   Anesthesia Other Findings Past Medical History: No date: Allergic rhinitis No date: Benign prostatic hypertrophy with lower urinary tract  symptoms (LUTS) No date: Chest pain     Comment:  a. 05/2013 MV: EF 70%, no ischemia. 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 carcinoid, left lung, Stage 1b (T2a,  N0, cM0);  b.               05/2013 s/p VATS & LULobectomy. No date: Monocytosis No date: Osteopenia No date: Paroxysmal atrial flutter ([place])     Comment:  a. 03/2013->no recurrence;  b. CHA2DS2VASc = 2-->not               currently on anticoagulation;  c. 05/2012 Echo: EF 55-60%,              normal RV. No date: Pre-diabetes No date: Wears glasses  Past Surgical History: No date: BRONCHOSCOPY     Comment:  04/06/2013 05/2013: CARDIOVASCULAR STRESS TEST     Comment:  a. No evidence of ischemia or infarct, EF 70%, no WMAs 07/14/2015: CATARACT EXTRACTION W/PHACO; Left     Comment:  Procedure: CATARACT EXTRACTION PHACO AND INTRAOCULAR               LENS PLACEMENT (IOC);  Surgeon: [name], MD;               Location: [place];  Service: Ophthalmology;                Laterality: Left; 10/01/2015: CATARACT EXTRACTION W/PHACO; Right     Comment:  Procedure: CATARACT EXTRACTION PHACO AND INTRAOCULAR               LENS PLACEMENT (IOC);  Surgeon: [name], MD;               Location: [place];  Service: Ophthalmology;  Laterality: Right; No date: COLONOSCOPY W/ POLYPECTOMY     Comment:  bleed after-had to go to surgery to stop bleeding via               colonoscopy 11/19/2015: COLONOSCOPY WITH PROPOFOL; N/A     Comment:  Procedure: COLONOSCOPY WITH PROPOFOL;  Surgeon: [name], MD;  Location[facility] ENDOSCOPY;  Service:               Endoscopy;  Laterality: N/A; 12/15/2011: DUPUYTREN CONTRACTURE RELEASE     Comment:  Procedure: DUPUYTREN CONTRACTURE RELEASE;  Surgeon: [name], MD;  Location: [place];                Service: Orthopedics;  Laterality: Left;  Fasciotomy left              ring finger dupuytrens 09/19/2019: ELECTROMAGNETIC NAVIGATION BROCHOSCOPY; Left     Comment:  Procedure: ELECTROMAGNETIC NAVIGATION BRONCHOSCOPY;                Surgeon: Tyler Pita, MD;   Location: [facility];                Service: Cardiopulmonary;  Laterality: Left; 11/07/2018: FASCIECTOMY; Right     Comment:  Procedure: SEGMENTAL FASCIECTOMY RIGHT RING FINGER;                Surgeon: Daryll Brod, MD;  Location: [place];  Service: Orthopedics;  Laterality: Right;                AXILLARY BLOCK 11/26/2019: FLEXIBLE BRONCHOSCOPY; Left     Comment:  Procedure: FLEXIBLE BRONCHOSCOPY WITH CRYO THERAPY;                Surgeon: Tyler Pita, MD;  Location: [facility];                Service: Cardiopulmonary;  Laterality: Left; 07/26/2017: IR ANGIOGRAM SELECTIVE EACH ADDITIONAL VESSEL 07/26/2017: IR ANGIOGRAM SELECTIVE EACH ADDITIONAL VESSEL 07/26/2017: IR ANGIOGRAM SELECTIVE EACH ADDITIONAL VESSEL 07/26/2017: IR ANGIOGRAM SELECTIVE EACH ADDITIONAL VESSEL 07/26/2017: IR ANGIOGRAM SELECTIVE EACH ADDITIONAL VESSEL 08/11/2017: IR ANGIOGRAM SELECTIVE EACH ADDITIONAL VESSEL 04/28/2018: IR ANGIOGRAM SELECTIVE EACH ADDITIONAL VESSEL 07/26/2017: IR ANGIOGRAM VISCERAL SELECTIVE 08/11/2017: IR ANGIOGRAM VISCERAL SELECTIVE 04/28/2018: IR ANGIOGRAM VISCERAL SELECTIVE 07/26/2017: IR EMBO ARTERIAL NOT HEMORR HEMANG INC GUIDE ROADMAPPING 08/11/2017: IR EMBO TUMOR ORGAN ISCHEMIA INFARCT INC GUIDE ROADMAPPING 04/28/2018: IR EMBO TUMOR ORGAN ISCHEMIA INFARCT INC GUIDE ROADMAPPING 07/07/2017: IR RADIOLOGIST EVAL & MGMT 09/07/2017: IR RADIOLOGIST EVAL & MGMT 12/21/2017: IR RADIOLOGIST EVAL & MGMT 03/22/2018: IR RADIOLOGIST EVAL & MGMT 06/08/2018: IR RADIOLOGIST EVAL & MGMT 11/01/2018: IR RADIOLOGIST EVAL & MGMT 03/21/2019: IR RADIOLOGIST EVAL & MGMT 11/08/2019: IR RADIOLOGIST EVAL & MGMT 07/01/2020: IR RADIOLOGIST EVAL & MGMT 12/18/2020: IR RADIOLOGIST EVAL & MGMT 06/18/2021: IR RADIOLOGIST EVAL & MGMT 07/26/2017: IR US GUIDE VASC ACCESS RIGHT 08/11/2017: IR US GUIDE VASC ACCESS RIGHT 04/28/2018: IR US GUIDE VASC ACCESS RIGHT 06/10/13: LUNG LOBECTOMY     Comment:  upper left lung No  date: MULTIPLE TOOTH EXTRACTIONS 11/17/2015: REMOVAL RETAINED LENS; Right     Comment:  Procedure: REMOVAL RETAINED LENS FROAGMENTS RIGHT EYE;  Surgeon: [name], MD;  Location: [place];  Service: Ophthalmology;  Laterality:               Right; No date: TONSILLECTOMY 11-03-15: UPPER GASTROINTESTINAL ENDOSCOPY     Comment:  Dr [name] No date: VASECTOMY 06/04/2013: VIDEO ASSISTED THORACOSCOPY (VATS)/WEDGE RESECTION; Left     Comment:  Procedure: VIDEO ASSISTED THORACOSCOPY (VATS)/WEDGE               RESECTION;  Surgeon: [name], MD;  Location: [place];  Service: Thoracic;  Laterality: Left; 06/04/2013: VIDEO BRONCHOSCOPY; N/A     Comment:  Procedure: VIDEO BRONCHOSCOPY;  Surgeon: [name], MD;  Location: [place];  Service: Thoracic;                Laterality: N/A; 08/12/2021: VIDEO BRONCHOSCOPY; Left     Comment:  Procedure: VIDEO BRONCHOSCOPY WITHOUT FLUORO;  Surgeon:               Tyler Pita, MD;  Location: [facility];  Service:               Cardiopulmonary;  Laterality: Left;  BMI    Body Mass Index: 26.09 kg/m      Reproductive/Obstetrics negative OB ROS                            Anesthesia Physical  Anesthesia Plan  ASA: 3  Anesthesia Plan: General   Post-op Pain Management:    Induction: Intravenous  PONV Risk Score and Plan: Ondansetron, Dexamethasone and Treatment may vary due to age or medical condition  Airway Management Planned: Oral ETT  Additional Equipment:   Intra-op Plan:   Post-operative Plan: Extubation in OR  Informed Consent: I have reviewed the patients History and Physical, chart, labs and discussed the procedure including the risks, benefits and alternatives for the proposed anesthesia with the patient or authorized representative who has indicated his/her understanding and acceptance.     Dental Advisory Given  Plan  Discussed with: CRNA and Surgeon  Anesthesia Plan Comments:        Anesthesia Quick Evaluation

## 2021-10-14 NOTE — Op Note (Signed)
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 Complications from bronchoscopy are rare and most often minor, but if they occur they may include breathing difficulty, vocal cord spasm, hoarseness, slight fever, vomiting, dizziness, bronchospasm, infection, low blood oxygen, bleeding from biopsy site, or an allergic reaction to medications.  It is uncommon for patients to experience other more serious complications for example: Collapsed lung requiring chest tube placement, respiratory failure, heart attack and/or cardiac arrhythmia.  Patient agreed to proceed.   Surgeon: Renold Don, MD Assistant/Scrub: Annia Belt, RRT Circulator: Liborio Nixon, RRT Anesthesiologist/CRNA: Iran Ouch, MD/Stephanie Michelet, CRNA Representatives: N/A     Description of the procedure: The patient was taken to Procedure Room 2 (Bronchoscopy Suite) in the OR area, where appropriate timeout was taken with the staff.  Patient was inducted under general anesthesia by the anesthesia team and intubated with a #9.0 ET tube.  A Portex adapter was placed on the endotracheal tube flange.  Once the patient was under  general anesthesia, the Olympus therapeutic video bronchoscope was advanced via the Portex adapter.  The airways were examined.  The visible trachea was normal, carina was sharp, the right mainstem bronchus was without any lesions or secretions.  Right upper lobe subsegments were without lesions or inspissated secretions.  Right middle lobe and right lower lobe subsegments were normal. Bronchoscope was then brought to the left mainstem  bronchus where the suture line to the left upper lobe was intact (prior left upper lobectomy).  No mass present.  The previously noted mass in the superior segment of the left lower lobe continued to look flat and mostly like mucosal change (see photos).  There was some minimal studding of the mucosa noted where the prior masses had been resected and subjected to cryotherapy.  At this point it was elected to treat the mucosa of this area with spray cryotherapy to continue ablation effect of potential tumor.  Utilizing the truFreeze spray cryotherapy catheter this was inserted through the working channel of the bronchoscope, the patient was disconnected from the anesthesia circuit, balloon on the ET tube was deflated, breath-hold was initiated and at this point the mucosa was treated with a 5-second freeze to the area.  The chest was monitored for potential rise during the procedure by the circulator.  Once this spray cryotherapy was done, the patient was then placed back on the circuit and allowed to ventilate.  Patients ventilation and oxygenation was allowed to equilibrate.  This freezing procedure was repeated x 2 more times total of 3 cycles.  Inspection of the lumen after the freezing cycles showed no overt trauma to the airway. Once the procedure was completed and adequate hemostasis was confirmed, the patient received 10 mL of 1% lidocaine via bronchial lavage and the bronchoscope was retrieved and the procedure terminated.  The patient was then allowed to to emerge from anesthesia and was extubated in the procedure room without difficulty.  Patient was transferred to the PACU in satisfactory and stable condition.    TECHNICAL PROCEDURES: (Place X beside choice below)   Procedures  Description    None     Electrocautery     Cryotherapy     Balloon Dilatation  Bronchography     Stent Placement     Therapeutic Aspiration   X Spray Cryotherapy X 3 cycles   Brachytherapy Catheter Placement     Foreign Body Removal      SPECIMENS (Sites): (Place X beside choice below)  Specimens Description  X No Specimens Obtained Known recurrent carcinoid   [place]    Lavage    Biopsies    Fine Needle Aspirates    Brushings    Sputum    FINDINGS:   Minimal studding and mucosal change on the left main bronchus towards the superior segment of the left lower lobe, no overt mass:   During cryotherapy:     Post cryotherapy:    ESTIMATED BLOOD LOSS: none  COMPLICATIONS/RESOLUTION: none.  Postprocedure chest x-ray showed no pneumothorax:     IMPRESSION:POST-PROCEDURE DX:  Minimal residual neuroendocrine tumor (atypical carcinoid) on left mainstem at the site of prior suture line left upper lobectomy Status post successful cryoablation of residual mucosal studding Continue lanreotide as per oncology   RECOMMENDATION/PLAN:  Keep pulmonary and oncology appointments Reassess airway response 4 to 6 months     C. Derrill Kay, MD Advanced Bronchoscopy PCCM [facility]    *This note was dictated using voice recognition software/Dragon.  Despite best efforts to proofread, errors can occur which can change the meaning. Any transcriptional errors that result from this process are unintentional and may not be fully corrected at the time of dictation.

## 2021-10-14 NOTE — Interval H&P Note (Signed)
Patient presents today for examination of prior cryoablation and tumor debulking/destruction performed previously.  Patient may require spray cryotherapy for further ablation.  History and physical unchanged from prior documented 08 October 2021 and linked to this document.  Procedure to be performed today: BRONCHOSCOPY WITH POTENTIAL CRYOABLATION AND TUMOR DESTRUCTION OF RECURRENT LEFT NEUROENDOCRINE CARCINOMA OF THE LUNG.  Benefits, limitations and potential complications of the procedure were discussed with the patient/family.  Complications from bronchoscopy are rare and most often minor, but if they occur they may include breathing difficulty, vocal cord spasm, hoarseness, slight fever, vomiting, dizziness, bronchospasm, infection, low blood oxygen, bleeding from biopsy site, or an allergic reaction to medications.  It is uncommon for patients to experience other more serious complications for example: Collapsed lung requiring chest tube placement, respiratory failure, heart attack and/or cardiac arrhythmia.  Patient agrees to proceed.  Renold Don, MD Advanced Bronchoscopy PCCM [facility]    *This note was dictated using voice recognition software/Dragon.  Despite best efforts to proofread, errors can occur which can change the meaning. Any transcriptional errors that result from this process are unintentional and may not be fully corrected at the time of dictation.

## 2021-10-14 NOTE — Discharge Instructions (Signed)

## 2021-10-14 NOTE — Transfer of Care (Signed)
Immediate Anesthesia Transfer of Care Note  Patient: Jason Malone  Procedure(s) Performed: FLEXIBLE BRONCHOSCOPY (Left)  Patient Location: PACU  Anesthesia Type:General  Level of Consciousness: awake, alert  and oriented  Airway & Oxygen Therapy: Patient Spontanous Breathing and Patient connected to nasal cannula oxygen  Post-op Assessment: Report given to RN and Post -op Vital signs reviewed and stable  Post vital signs: Reviewed and stable  Last Vitals:  Vitals Value Taken Time  BP 128/67 10/14/21 1331  Temp    Pulse 53 10/14/21 1334  Resp 17 10/14/21 1334  SpO2 98 % 10/14/21 1334  Vitals shown include unvalidated device data.  Last Pain:  Vitals:   10/14/21 1126  TempSrc: Temporal  PainSc: 0-No pain         Complications: No notable events documented.

## 2021-10-14 NOTE — Anesthesia Procedure Notes (Signed)
Procedure Name: Intubation Date/Time: 10/14/2021 12:55 PM Performed by: Johnna Acosta, CRNA Pre-anesthesia Checklist: Patient identified, Emergency Drugs available, Suction available, Patient being monitored and Timeout performed Patient Re-evaluated:Patient Re-evaluated prior to induction Oxygen Delivery Method: Circle system utilized Preoxygenation: Pre-oxygenation with 100% oxygen Induction Type: IV induction Ventilation: Mask ventilation without difficulty Laryngoscope Size: McGraph and 3 Grade View: Grade I Tube type: Oral Tube size: 9.0 mm Number of attempts: 1 Airway Equipment and Method: Stylet and Video-laryngoscopy Placement Confirmation: ETT inserted through vocal cords under direct vision, positive ETCO2 and breath sounds checked- equal and bilateral Secured at: 21 cm Tube secured with: Tape Dental Injury: Teeth and Oropharynx as per pre-operative assessment  Difficulty Due To: Difficulty was unanticipated

## 2021-10-15 ENCOUNTER — Encounter: Payer: Self-pay | Admitting: Pulmonary Disease

## 2021-10-15 NOTE — Anesthesia Postprocedure Evaluation (Signed)
Anesthesia Post Note  Patient: Jason Malone  Procedure(s) Performed: FLEXIBLE BRONCHOSCOPY (Left)  Patient location during evaluation: PACU Anesthesia Type: General Level of consciousness: awake and alert Pain management: pain level controlled Vital Signs Assessment: post-procedure vital signs reviewed and stable Respiratory status: spontaneous breathing, nonlabored ventilation and respiratory function stable Cardiovascular status: blood pressure returned to baseline and stable Postop Assessment: no apparent nausea or vomiting Anesthetic complications: no   No notable events documented.   Last Vitals:  Vitals:   10/14/21 1415 10/14/21 1432  BP: 129/64 (!) 144/65  Pulse: (!) 54 60  Resp: 14 16  Temp: (!) 36.3 C 36.5 C  SpO2: 93% 96%    Last Pain:  Vitals:   10/15/21 0834  TempSrc:   PainSc: 0-No pain                 Iran Ouch

## 2021-11-03 ENCOUNTER — Inpatient Hospital Stay: Payer: Medicare Other

## 2021-11-03 ENCOUNTER — Encounter: Payer: Self-pay | Admitting: Medical Oncology

## 2021-11-03 ENCOUNTER — Inpatient Hospital Stay: Payer: Medicare Other | Attending: Oncology

## 2021-11-03 ENCOUNTER — Inpatient Hospital Stay (HOSPITAL_BASED_OUTPATIENT_CLINIC_OR_DEPARTMENT_OTHER): Payer: Medicare Other | Admitting: Medical Oncology

## 2021-11-03 VITALS — BP 129/69 | HR 58 | Temp 97.0°F | Resp 17 | Wt 157.8 lb

## 2021-11-03 DIAGNOSIS — C7B8 Other secondary neuroendocrine tumors: Secondary | ICD-10-CM | POA: Diagnosis not present

## 2021-11-03 DIAGNOSIS — C787 Secondary malignant neoplasm of liver and intrahepatic bile duct: Secondary | ICD-10-CM | POA: Insufficient documentation

## 2021-11-03 DIAGNOSIS — C7A8 Other malignant neuroendocrine tumors: Secondary | ICD-10-CM | POA: Insufficient documentation

## 2021-11-03 DIAGNOSIS — Z79899 Other long term (current) drug therapy: Secondary | ICD-10-CM | POA: Diagnosis not present

## 2021-11-03 LAB — COMPREHENSIVE METABOLIC PANEL
ALT: 20 U/L (ref 0–44)
AST: 19 U/L (ref 15–41)
Albumin: 4 g/dL (ref 3.5–5.0)
Alkaline Phosphatase: 138 U/L — ABNORMAL HIGH (ref 38–126)
Anion gap: 7 (ref 5–15)
BUN: 24 mg/dL — ABNORMAL HIGH (ref 8–23)
CO2: 26 mmol/L (ref 22–32)
Calcium: 9.2 mg/dL (ref 8.9–10.3)
Chloride: 104 mmol/L (ref 98–111)
Creatinine, Ser: 1.05 mg/dL (ref 0.61–1.24)
GFR, Estimated: >60 mL/min (ref >60)
Glucose, Bld: 144 mg/dL — ABNORMAL HIGH (ref 70–99)
Potassium: 4.4 mmol/L (ref 3.5–5.1)
Sodium: 137 mmol/L (ref 135–145)
Total Bilirubin: 0.8 mg/dL (ref 0.3–1.2)
Total Protein: 7 g/dL (ref 6.5–8.1)

## 2021-11-03 LAB — CBC WITH DIFFERENTIAL/PLATELET
Abs Immature Granulocytes: 0.03 10*3/uL (ref 0.00–0.07)
Basophils Absolute: 0.1 10*3/uL (ref 0.0–0.1)
Basophils Relative: 1 %
Eosinophils Absolute: 0.1 10*3/uL (ref 0.0–0.5)
Eosinophils Relative: 1 %
HCT: 45.1 % (ref 39.0–52.0)
Hemoglobin: 14.9 g/dL (ref 13.0–17.0)
Immature Granulocytes: 0 %
Lymphocytes Relative: 9 %
Lymphs Abs: 1 10*3/uL (ref 0.7–4.0)
MCH: 30.6 pg (ref 26.0–34.0)
MCHC: 33 g/dL (ref 30.0–36.0)
MCV: 92.6 fL (ref 80.0–100.0)
Monocytes Absolute: 1.9 10*3/uL — ABNORMAL HIGH (ref 0.1–1.0)
Monocytes Relative: 17 %
Neutro Abs: 7.9 10*3/uL — ABNORMAL HIGH (ref 1.7–7.7)
Neutrophils Relative %: 72 %
Platelets: 194 10*3/uL (ref 150–400)
RBC: 4.87 MIL/uL (ref 4.22–5.81)
RDW: 13.9 % (ref 11.5–15.5)
WBC: 10.9 10*3/uL — ABNORMAL HIGH (ref 4.0–10.5)
nRBC: 0 % (ref 0.0–0.2)

## 2021-11-03 MED ORDER — LANREOTIDE ACETATE 120 MG/0.5ML ~~LOC~~ SOLN
120.0000 mg | Freq: Once | SUBCUTANEOUS | Status: AC
  Administered 2021-11-03: 120 mg via SUBCUTANEOUS
  Filled 2021-11-03: qty 120

## 2021-11-04 LAB — CHROMOGRANIN A: Chromogranin A (ng/mL): 947.4 ng/mL — ABNORMAL HIGH (ref 0.0–101.8)

## 2021-11-05 ENCOUNTER — Other Ambulatory Visit: Payer: Self-pay | Admitting: Medical Oncology

## 2021-11-05 DIAGNOSIS — C7A8 Other malignant neuroendocrine tumors: Secondary | ICD-10-CM

## 2021-11-05 DIAGNOSIS — C7B8 Other secondary neuroendocrine tumors: Secondary | ICD-10-CM

## 2021-11-07 ENCOUNTER — Other Ambulatory Visit: Payer: Self-pay | Admitting: Family Medicine

## 2021-11-07 DIAGNOSIS — E782 Mixed hyperlipidemia: Secondary | ICD-10-CM

## 2021-11-09 NOTE — Telephone Encounter (Signed)
Requested Prescriptions  Pending Prescriptions Disp Refills  . atorvastatin (LIPITOR) 40 MG tablet [Pharmacy Med Name: Atorvastatin Calcium 40 MG Oral Tablet] 90 tablet 0    Sig: TAKE 1 TABLET BY MOUTH ONCE DAILY FOR CHOLESTEROL     Cardiovascular:  Antilipid - Statins Failed - 11/07/2021  4:31 PM      Failed - Lipid Panel in normal range within the last 12 months    Cholesterol, Total  Date Value Ref Range Status  02/14/2017 133 100 - 199 mg/dL Final   Cholesterol  Date Value Ref Range Status  06/15/2019 83 0 - 200 mg/dL Final   LDL Cholesterol (Calc)  Date Value Ref Range Status  04/11/2019 53 mg/dL (calc) Final    Comment:    Reference range: <100 . Desirable range <100 mg/dL for primary prevention;   <70 mg/dL for patients with CHD or diabetic patients  with > or = 2 CHD risk factors. [name] LDL-C is now calculated using the Martin-Hopkins  calculation, which is a validated novel method providing  better accuracy than the Friedewald equation in the  estimation of LDL-C.  Cresenciano Genre et al. Annamaria Helling. 7619;509(32): 2061-2068  (http://education.[employer].com/faq/FAQ164)    LDL Cholesterol  Date Value Ref Range Status  06/15/2019 42 0 - 99 mg/dL Final    Comment:           Total Cholesterol/HDL:CHD Risk Coronary Heart Disease Risk Table                     Men   Women  1/2 Average Risk   3.4   3.3  Average Risk       5.0   4.4  2 X Average Risk   9.6   7.1  3 X Average Risk  23.4   11.0        Use the calculated Patient Ratio above and the CHD Risk Table to determine the patient's CHD Risk.        ATP III CLASSIFICATION (LDL):  <100     mg/dL   Optimal  100-129  mg/dL   Near or Above                    Optimal  130-159  mg/dL   Borderline  160-189  mg/dL   High  >190     mg/dL   Very High Performed at [facility] [address], [place], [place] [address]    HDL  Date Value Ref Range Status  06/15/2019 32 (L) >40 mg/dL Final  02/14/2017  39 (L) >39 mg/dL Final    Comment:    **Effective February 28, 2017, HDL Cholesterol**   reference interval will be changing to:                                   Male        Male                               [phone]   60 - [id number]    Triglycerides  Date Value Ref Range Status  06/15/2019 44 <150 mg/dL Final         Passed - Patient is not pregnant      Passed - Valid encounter within last 12 months    Recent Outpatient Visits  1 month ago Essential hypertension   [facility] Teodora Medici, DO   3 months ago Essential hypertension   [facility] Teodora Medici, DO   4 months ago Type 2 diabetes mellitus without complication, without long-term current use of insulin [facility])   [facility] Teodora Medici, DO   10 months ago Type 2 diabetes mellitus without complication, without long-term current use of insulin [facility])   [facility] Myles Gip, DO   1 year ago Essential hypertension   [place], [place]      Future Appointments            In 1 month Teodora Medici, [facility], [facility]

## 2021-11-17 ENCOUNTER — Encounter: Payer: Self-pay | Admitting: Pulmonary Disease

## 2021-11-17 ENCOUNTER — Ambulatory Visit (INDEPENDENT_AMBULATORY_CARE_PROVIDER_SITE_OTHER): Payer: Medicare Other | Admitting: Pulmonary Disease

## 2021-11-17 VITALS — BP 102/50 | HR 59 | Temp 98.2°F | Ht 64.0 in | Wt 160.0 lb

## 2021-11-17 DIAGNOSIS — J8410 Pulmonary fibrosis, unspecified: Secondary | ICD-10-CM | POA: Diagnosis not present

## 2021-11-17 DIAGNOSIS — C7A8 Other malignant neuroendocrine tumors: Secondary | ICD-10-CM

## 2021-11-17 NOTE — Progress Notes (Signed)
Subjective:    Patient ID: Jason Malone, male    DOB: 1941-05-08, 81 y.o.   MRN: [id number] Patient Care Team: Teodora Medici, DO as PCP - General (Internal Medicine) Rockey Situ Kathlene November, MD as PCP - Cardiology (Cardiology) [name], Forest Gleason, MD (General Surgery) Nestor Lewandowsky, MD (Inactive) as Referring Physician (Cardiothoracic Surgery) Minna Merritts, MD as Consulting Physician (Cardiology) [name], MD (Inactive) as Consulting Physician (Cardiothoracic Surgery) Lloyd Huger, MD as Consulting Physician (Oncology) Arnetha Courser, MD as Attending Physician [facility])  Chief Complaint  Patient presents with   Follow-up    Occ SOB with exertion and mild dry cough.     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  The patient underwent resection for carcinoid tumor in 2015 however he had recurrence noted in August 2018 when liver involvement was noted.  In January 2021 he had a severe case of COVID-19 and was admitted due to respiratory failure for the same.  A CT scan performed 11 May 2019 showed typical parenchymal involvement of COVID in the right lung, the left lung had a lobulated mass in the left mainstem bronchus and appeared to be arising from the suture line of the prior resection.  He underwent cryoablation and tumor debulking in 3 separate occasions on 19 Sep 2019, 10 October 2019 and on 26 November 2019.  Mass was debulked and significant reduced in size.  There was minimal residual noted on the last session with cryotherapy performed at that time.  Biopsies confirmed neuroendocrine carcinoma.  He did well until August 2022 when there was noted that there may be some recurrence at the suture line.  However at that time it was not obstructing the bronchus and the patient had not noticed any significant shortness of breath over his baseline.   At that time he also wanted to postpone procedures.  On 12 June 2021 he had a CT chest with contrast that showed that the nodule or soft tissue in the anterior aspect of the left mainstem bronchus had continued to increase.  He was scheduled for bronchoscopy on 20 February however he had another bout with COVID and this had to be postponed.  He subsequently underwent repeat bronchoscopy on 5 April that showed that he had complete obstruction of his left mainstem bronchus he underwent cryoablation and tumor debulking.  He underwent a second session on 09 Sep 2021 and this time he had only minimal residual noted from the prior procedure.  His third and final session on that cycle was on 14 October 2021.  There was only minimal residual tissue noted.  He underwent cryoablation of this area.  He tolerated that procedure well and has done well after that.  This is a follow-up from that procedure.  He is to have chest abdomen and pelvis CT on Thursday 13 July.   He has been doing well, he does not endorse any fevers, chills or sweats.  Shortness of breath that was noted prior to his April bronchoscopy has now resolved and he remains symptom-free in this regard he does still occasionally get short of breath only on heavy exertion.  He has rare occasional cough that is dry and nonproductive.  No hemoptysis.  No sputum production.  No weight loss or anorexia.  He does have back pain which is a new symptom for him.  He has known sclerotic metastatic lesions to the spine.  Otherwise he feels well and looks well.   Review of Systems A 10 point review of systems was performed and it is as noted above otherwise negative.  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   Respiratory failure ([place]) 06/11/2019   Elevated LFTs 06/11/2019   Pneumonia due to COVID-19 virus 06/10/2019   Contracture of palmar fascia 10/11/2018   Microalbuminuria due to type 2 diabetes mellitus ([place]) 04/12/2018   Type 2 diabetes mellitus ([place]) 04/10/2018   Osteopenia determined by x-ray 01/07/2017   Neuroendocrine carcinoma of lung ([place]) 12/28/2016   Low back pain 12/17/2016   Liver mass, left lobe 08/28/2016   Aortic atherosclerosis ([place]) 04/06/2016   Coronary artery disease involving native heart without angina pectoris 04/06/2016   History of colonic polyps 10/28/2015   Medication monitoring encounter 08/15/2015   Paroxysmal atrial flutter (HCC)    Elevated serum alkaline phosphatase level 03/05/2015   Allergic rhinitis    BPH without urinary obstruction    Hyperlipidemia 02/13/2014   Orthostasis 07/18/2013   Sinus bradycardia 07/18/2013   S/P lobectomy of lung 06/04/2013   Atrial flutter ([place]) 04/09/2013   Smoking history 04/09/2013   Essential hypertension 04/09/2013   Rectal bleeding 12/15/2012   Social History   Tobacco Use   Smoking status: Former    Packs/day: 1.00    Years: 40.00    Total pack years: 40.00    Types: Cigarettes    Quit date: 12/09/1998    Years since quitting: 22.9   Smokeless tobacco: Never  Substance Use Topics   Alcohol use: No   Allergies  Allergen Reactions   Tape Itching    Surgical tapes    Current Meds  Medication Sig   amLODipine (NORVASC) 2.5 MG tablet Take 1 tablet (2.5 mg total) by mouth daily. (Patient taking differently: Take 2.5 mg by mouth every morning.)   amLODipine (NORVASC) 5 MG tablet Take 1 tablet (5 mg total) by mouth daily. (Patient taking differently: Take 5 mg by  mouth every morning.)   aspirin EC 81 MG tablet Take 81 mg by mouth daily.   atorvastatin (LIPITOR) 40 MG tablet TAKE 1 TABLET BY MOUTH ONCE DAILY FOR CHOLESTEROL   b complex vitamins capsule Take 1 capsule by mouth daily.   cholecalciferol (VITAMIN D3) 25 MCG (1000 UNIT) tablet Take 1,000 Units by mouth daily.   Cinnamon 500 MG capsule Take 500 mg by mouth at bedtime.   diphenhydrAMINE (BENADRYL) 25 mg capsule Take 25 mg by mouth every morning.   DM-APAP-CPM (CORICIDIN HBP PO) Take 2 tablets by mouth daily as needed (allergies/cold symptoms).   glucose blood (ONETOUCH VERIO) test strip USE 1 STRIP TO CHECK GLUCOSE ONCE DAILY   ipratropium (ATROVENT) 0.03 % nasal spray Place 2 sprays into both nostrils 3 (three) times daily. (Patient taking differently: Place 2 sprays into both nostrils daily as needed for rhinitis.)   losartan (COZAAR) 100 MG tablet Take 100 mg by mouth every morning.   Multiple  Vitamins-Minerals (MULTIVITAMIN WITH MINERALS) tablet Take 1 tablet by mouth daily.   polycarbophil (FIBERCON) 625 MG tablet Take 625 mg by mouth 2 (two) times daily.   polyvinyl alcohol (LIQUIFILM TEARS) 1.4 % ophthalmic solution Place 1 drop into both eyes as needed for dry eyes.   vitamin C (ASCORBIC ACID) 500 MG tablet Take 500 mg by mouth daily.   zinc sulfate 220 (50 Zn) MG capsule Take 1 capsule (220 mg total) by mouth daily.   Immunization History  Administered Date(s) Administered   Fluad Quad(high Dose 65+) 01/24/2019, 01/23/2020, 01/30/2021   Influenza, High Dose Seasonal PF 02/12/2016, 02/14/2017, 01/17/2018   Influenza,inj,Quad PF,6+ Mos 02/14/2015   PFIZER(Purple Top)SARS-COV-2 Vaccination 05/24/2019, 10/02/2019, 04/14/2020   Pneumococcal Conjugate-13 02/01/2014   Pneumococcal Polysaccharide-23 05/10/2008       Objective:   Physical Exam BP (!) 102/50 (BP Location: Left Arm, Cuff Size: Normal)   Pulse (!) 59   Temp 98.2 F (36.8 C) (Temporal)   Ht 5\' 4"  (1.626 m)   Wt 160 lb  (72.6 kg)   SpO2 96%   BMI 27.46 kg/m  GENERAL: Awake and alert, well developed, well nourished gentleman, in no respiratory distress, fully ambulatory, no conversational dyspnea. HEAD: Normocephalic, atraumatic. EYES: Pupils equal, round, reactive to light.  No scleral icterus. MOUTH: Wears dentures.  No thrush. NECK: Supple. No thyromegaly. No nodules. No JVD.  Trachea is midline PULMONARY:  Symmetrical and good air entry, slightly diminished breath sounds in the left upper lung zone, no wheezes or rhonchi noted. CARDIOVASCULAR: S1 and S2.    Regular rate and rhythm.  No rubs murmurs or gallops appreciated. GASTROINTESTINAL: Benign. MUSCULOSKELETAL: No joint deformity, no clubbing, no edema. NEUROLOGIC: No focal deficit, no gait disturbance, speech is fluent. SKIN: Intact,warm,dry. No rashes noted on limited exam. PSYCH: Mood and behavior normal.     Assessment & Plan:     ICD-10-CM   1. Neuroendocrine carcinoma of lung (HCC) -stage IVa  C7A.8    S/P cryoablation and tumor debulking - left lobectomy suture line Clinically doing well We will follow-up on scan to be done 13 July     2. Postinflammatory pulmonary fibrosis (HCC)  J84.10    Clinically asymptomatic Not progressive     See the patient in follow-up in 3 to 4 months time.  He is to contact us prior to that time should any new difficulties arise.   Renold Don, MD Advanced Bronchoscopy PCCM [facility]    *This note was dictated using voice recognition software/Dragon.  Despite best efforts to proofread, errors can occur which can change the meaning. Any transcriptional errors that result from this process are unintentional and may not be fully corrected at the time of dictation.

## 2021-11-17 NOTE — Patient Instructions (Signed)
We will see him in follow-up in 3 to 4 months time.  I will check on the CT scan that you will have on  Thursday.

## 2021-11-19 ENCOUNTER — Ambulatory Visit
Admission: RE | Admit: 2021-11-19 | Discharge: 2021-11-19 | Disposition: A | Discharge status: Home / Self Care | Payer: Medicare Other | Source: Ambulatory Visit | Attending: Medical Oncology | Admitting: Medical Oncology

## 2021-11-19 DIAGNOSIS — C7A8 Other malignant neuroendocrine tumors: Secondary | ICD-10-CM | POA: Insufficient documentation

## 2021-11-19 DIAGNOSIS — C7B8 Other secondary neuroendocrine tumors: Secondary | ICD-10-CM | POA: Insufficient documentation

## 2021-11-19 MED ORDER — IOHEXOL 300 MG/ML  SOLN
80.0000 mL | Freq: Once | INTRAMUSCULAR | Status: AC | PRN
  Administered 2021-11-19: 80 mL via INTRAVENOUS

## 2021-11-20 ENCOUNTER — Telehealth: Payer: Self-pay | Admitting: Cardiovascular Disease

## 2021-11-20 NOTE — Telephone Encounter (Signed)
3 attempts to schedule fu appt from recall list.   Deleting recall.   

## 2021-11-24 ENCOUNTER — Other Ambulatory Visit: Payer: Self-pay | Admitting: Interventional Radiology

## 2021-11-24 DIAGNOSIS — C7A8 Other malignant neuroendocrine tumors: Secondary | ICD-10-CM

## 2021-11-30 ENCOUNTER — Other Ambulatory Visit: Payer: Self-pay

## 2021-11-30 DIAGNOSIS — C7A8 Other malignant neuroendocrine tumors: Secondary | ICD-10-CM

## 2021-12-01 ENCOUNTER — Inpatient Hospital Stay: Payer: Medicare Other

## 2021-12-01 ENCOUNTER — Inpatient Hospital Stay: Payer: Medicare Other | Attending: Oncology

## 2021-12-01 DIAGNOSIS — Z79899 Other long term (current) drug therapy: Secondary | ICD-10-CM | POA: Diagnosis not present

## 2021-12-01 DIAGNOSIS — C7A8 Other malignant neuroendocrine tumors: Secondary | ICD-10-CM | POA: Insufficient documentation

## 2021-12-01 LAB — CBC WITH DIFFERENTIAL/PLATELET
Abs Immature Granulocytes: 0.05 10*3/uL (ref 0.00–0.07)
Basophils Absolute: 0.1 10*3/uL (ref 0.0–0.1)
Basophils Relative: 1 %
Eosinophils Absolute: 0.1 10*3/uL (ref 0.0–0.5)
Eosinophils Relative: 1 %
HCT: 46.4 % (ref 39.0–52.0)
Hemoglobin: 15.5 g/dL (ref 13.0–17.0)
Immature Granulocytes: 1 %
Lymphocytes Relative: 11 %
Lymphs Abs: 1 10*3/uL (ref 0.7–4.0)
MCH: 30.8 pg (ref 26.0–34.0)
MCHC: 33.4 g/dL (ref 30.0–36.0)
MCV: 92.2 fL (ref 80.0–100.0)
Monocytes Absolute: 1.5 10*3/uL — ABNORMAL HIGH (ref 0.1–1.0)
Monocytes Relative: 16 %
Neutro Abs: 6.8 10*3/uL (ref 1.7–7.7)
Neutrophils Relative %: 70 %
Platelets: 167 10*3/uL (ref 150–400)
RBC: 5.03 MIL/uL (ref 4.22–5.81)
RDW: 13.9 % (ref 11.5–15.5)
WBC: 9.6 10*3/uL (ref 4.0–10.5)
nRBC: 0 % (ref 0.0–0.2)

## 2021-12-01 LAB — COMPREHENSIVE METABOLIC PANEL
ALT: 22 U/L (ref 0–44)
AST: 23 U/L (ref 15–41)
Albumin: 4.1 g/dL (ref 3.5–5.0)
Alkaline Phosphatase: 128 U/L — ABNORMAL HIGH (ref 38–126)
Anion gap: 4 — ABNORMAL LOW (ref 5–15)
BUN: 20 mg/dL (ref 8–23)
CO2: 27 mmol/L (ref 22–32)
Calcium: 9.3 mg/dL (ref 8.9–10.3)
Chloride: 106 mmol/L (ref 98–111)
Creatinine, Ser: 0.95 mg/dL (ref 0.61–1.24)
GFR, Estimated: >60 mL/min (ref >60)
Glucose, Bld: 98 mg/dL (ref 70–99)
Potassium: 4.6 mmol/L (ref 3.5–5.1)
Sodium: 137 mmol/L (ref 135–145)
Total Bilirubin: 0.9 mg/dL (ref 0.3–1.2)
Total Protein: 7 g/dL (ref 6.5–8.1)

## 2021-12-01 MED ORDER — LANREOTIDE ACETATE 120 MG/0.5ML ~~LOC~~ SOLN
120.0000 mg | Freq: Once | SUBCUTANEOUS | Status: AC
  Administered 2021-12-01: 120 mg via SUBCUTANEOUS
  Filled 2021-12-01: qty 120

## 2021-12-02 LAB — CHROMOGRANIN A: Chromogranin A (ng/mL): 551.2 ng/mL — ABNORMAL HIGH (ref 0.0–101.8)

## 2021-12-14 ENCOUNTER — Encounter: Payer: Self-pay | Admitting: *Deleted

## 2021-12-14 ENCOUNTER — Other Ambulatory Visit: Payer: Self-pay | Admitting: Family Medicine

## 2021-12-14 ENCOUNTER — Ambulatory Visit
Admission: RE | Admit: 2021-12-14 | Discharge: 2021-12-14 | Disposition: A | Discharge status: Home / Self Care | Payer: Medicare Other | Source: Ambulatory Visit | Attending: Interventional Radiology | Admitting: Interventional Radiology

## 2021-12-14 DIAGNOSIS — C7B8 Other secondary neuroendocrine tumors: Secondary | ICD-10-CM

## 2021-12-14 DIAGNOSIS — K769 Liver disease, unspecified: Secondary | ICD-10-CM

## 2021-12-14 HISTORY — PX: IR RADIOLOGIST EVAL & MGMT: IMG5224

## 2021-12-14 NOTE — Progress Notes (Signed)
Patient ID: Jason Malone, male   DOB: 02-Dec-1940, 81 y.o.   MRN: [id number]       Chief Complaint:  Metastatic neuroendocrine tumor to liver  Referring Physician(s):  Dr. Verdia Kuba  History of Present Illness:  Jason Malone is a 81 y.o. male with known stage IV metastatic neuroendocrine tumor to the liver.  He is over 3 years status post previous whole liver Y90 radioembolization.  He continues to do very well clinically.  Excellent functional status.  Most recently had bronchoscopy and cryo therapy to residual neuroendocrine tumor within the left mainstem bronchus by Dr. Patsey Berthold at [facility] back in June.  He also is followed closely by Dr. Grayland Ormond and gets monthly Sandostatin injections.  Most recent CT 11/19/2021 demonstrates 2 new hypodense right hepatic lesions measuring approximately 2 cm suspicious for new hepatic disease.  Treated [place] lesions in the liver remained stable.  No biliary dilatation obstruction pattern.  No new extrahepatic disease.  No developing ascites or cirrhosis.  Past Medical History:  Diagnosis Date   Allergic rhinitis    Aortic atherosclerosis (HCC)    Benign prostatic hypertrophy with lower urinary tract symptoms (LUTS)    Bradycardia    Chest pain    a. 05/2013 MV: EF 70%, no ischemia.   Chronic respiratory failure with hypoxia (HCC)    COPD (chronic obstructive pulmonary disease) (HCC)    Coronary artery disease    COVID-19    Diabetes mellitus without complication ([place])    last a1c 6.5 09-16-21 no meds   Diverticulitis    DIVERTICULOSIS   Dysrhythmia    Elevated LFTs    Enlarged prostate    Essential hypertension    CONTROLLED ON MEDS   Full dentures    H/O hypokalemia    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 08/28/2016   Hyperlipemia    Hypomagnesemia    IFG (impaired fasting glucose)    Liver cancer ([place]) 12/08/2016   Liver mass, left lobe 08/28/2016   Probably hemangioma; will get MRI of  liver, refer to GI   Lung cancer ([place])    a. carcinoid, left lung, Stage 1b (T2a, N0, cM0);  b. 05/2013 s/p VATS & LULobectomy.   Metabolic acidosis    Monocytosis    Osteopenia    Paroxysmal atrial flutter (HCC)    a. 03/2013->no recurrence;  b. CHA2DS2VASc = 2-->not currently on anticoagulation;  c. 05/2012 Echo: EF 55-60%, normal RV.   Pneumonia    Postinflammatory pulmonary fibrosis (HCC)    Rectal bleeding    Smoking history    Wears glasses     Past Surgical History:  Procedure Laterality Date   BRONCHOSCOPY     04/06/2013   CARDIOVASCULAR STRESS TEST  05/2013   a. No evidence of ischemia or infarct, EF 70%, no WMAs   CATARACT EXTRACTION W/PHACO Left 07/14/2015   Procedure: CATARACT EXTRACTION PHACO AND INTRAOCULAR LENS PLACEMENT (IOC);  Surgeon: [name], MD;  Location: [place];  Service: Ophthalmology;  Laterality: Left;   CATARACT EXTRACTION W/PHACO Right 10/01/2015   Procedure: CATARACT EXTRACTION PHACO AND INTRAOCULAR LENS PLACEMENT (IOC);  Surgeon: [name], MD;  Location: [place];  Service: Ophthalmology;  Laterality: Right;   COLONOSCOPY W/ POLYPECTOMY     bleed after-had to go to surgery to stop bleeding via colonoscopy   COLONOSCOPY WITH PROPOFOL N/A 11/19/2015   Procedure: COLONOSCOPY WITH PROPOFOL;  Surgeon: [name], MD;  Location: [facility];  Service: Endoscopy;  Laterality: N/A;   DUPUYTREN CONTRACTURE RELEASE  12/15/2011   Procedure: DUPUYTREN CONTRACTURE RELEASE;  Surgeon: [name], MD;  Location: [place];  Service: Orthopedics;  Laterality: Left;  Fasciotomy left ring finger dupuytrens   ELECTROMAGNETIC NAVIGATION BROCHOSCOPY Left 09/19/2019   Procedure: ELECTROMAGNETIC NAVIGATION BRONCHOSCOPY;  Surgeon: Tyler Pita, MD;  Location: [facility];  Service: Cardiopulmonary;  Laterality: Left;   FASCIECTOMY Right 11/07/2018   Procedure: SEGMENTAL FASCIECTOMY RIGHT RING FINGER;  Surgeon:  Daryll Brod, MD;  Location: [place];  Service: Orthopedics;  Laterality: Right;  AXILLARY BLOCK   FLEXIBLE BRONCHOSCOPY Left 11/26/2019   Procedure: FLEXIBLE BRONCHOSCOPY WITH CRYO THERAPY;  Surgeon: Tyler Pita, MD;  Location: [facility];  Service: Cardiopulmonary;  Laterality: Left;   FLEXIBLE BRONCHOSCOPY Left 10/14/2021   Procedure: FLEXIBLE BRONCHOSCOPY;  Surgeon: Tyler Pita, MD;  Location: [facility];  Service: Pulmonary;  Laterality: Left;   IR ANGIOGRAM SELECTIVE EACH ADDITIONAL VESSEL  07/26/2017   IR ANGIOGRAM SELECTIVE EACH ADDITIONAL VESSEL  07/26/2017   IR ANGIOGRAM SELECTIVE EACH ADDITIONAL VESSEL  07/26/2017   IR ANGIOGRAM SELECTIVE EACH ADDITIONAL VESSEL  07/26/2017   IR ANGIOGRAM SELECTIVE EACH ADDITIONAL VESSEL  07/26/2017   IR ANGIOGRAM SELECTIVE EACH ADDITIONAL VESSEL  08/11/2017   IR ANGIOGRAM SELECTIVE EACH ADDITIONAL VESSEL  04/28/2018   IR ANGIOGRAM VISCERAL SELECTIVE  07/26/2017   IR ANGIOGRAM VISCERAL SELECTIVE  08/11/2017   IR ANGIOGRAM VISCERAL SELECTIVE  04/28/2018   IR EMBO ARTERIAL NOT HEMORR HEMANG INC GUIDE ROADMAPPING  07/26/2017   IR EMBO TUMOR ORGAN ISCHEMIA INFARCT INC GUIDE ROADMAPPING  08/11/2017   IR EMBO TUMOR ORGAN ISCHEMIA INFARCT INC GUIDE ROADMAPPING  04/28/2018   IR RADIOLOGIST EVAL & MGMT  07/07/2017   IR RADIOLOGIST EVAL & MGMT  09/07/2017   IR RADIOLOGIST EVAL & MGMT  12/21/2017   IR RADIOLOGIST EVAL & MGMT  03/22/2018   IR RADIOLOGIST EVAL & MGMT  06/08/2018   IR RADIOLOGIST EVAL & MGMT  11/01/2018   IR RADIOLOGIST EVAL & MGMT  03/21/2019   IR RADIOLOGIST EVAL & MGMT  11/08/2019   IR RADIOLOGIST EVAL & MGMT  07/01/2020   IR RADIOLOGIST EVAL & MGMT  12/18/2020   IR RADIOLOGIST EVAL & MGMT  06/18/2021   IR US GUIDE VASC ACCESS RIGHT  07/26/2017   IR US GUIDE VASC ACCESS RIGHT  08/11/2017   IR US GUIDE VASC ACCESS RIGHT  04/28/2018   LUNG LOBECTOMY  06/10/13   upper left lung   MULTIPLE TOOTH EXTRACTIONS     REMOVAL RETAINED LENS Right  11/17/2015   Procedure: REMOVAL RETAINED LENS FROAGMENTS RIGHT EYE;  Surgeon: [name], MD;  Location: [place];  Service: Ophthalmology;  Laterality: Right;   TONSILLECTOMY     UPPER GASTROINTESTINAL ENDOSCOPY  11-03-15   Dr [name]   VASECTOMY     VIDEO ASSISTED THORACOSCOPY (VATS)/WEDGE RESECTION Left 06/04/2013   Procedure: VIDEO ASSISTED THORACOSCOPY (VATS)/WEDGE RESECTION;  Surgeon: [name], MD;  Location: [place];  Service: Thoracic;  Laterality: Left;   VIDEO BRONCHOSCOPY N/A 06/04/2013   Procedure: VIDEO BRONCHOSCOPY;  Surgeon: [name], MD;  Location: [place];  Service: Thoracic;  Laterality: N/A;   VIDEO BRONCHOSCOPY Left 08/12/2021   Procedure: VIDEO BRONCHOSCOPY WITHOUT FLUORO;  Surgeon: Tyler Pita, MD;  Location: [facility];  Service: Cardiopulmonary;  Laterality: Left;   VIDEO BRONCHOSCOPY Left 09/09/2021   Procedure: VIDEO BRONCHOSCOPY WITHOUT FLUORO;  Surgeon:  Tyler Pita, MD;  Location: [facility];  Service: Cardiopulmonary;  Laterality: Left;    Allergies: Tape  Medications: Prior to Admission medications   Medication Sig Start Date End Date Taking? Authorizing Provider  amLODipine (NORVASC) 2.5 MG tablet Take 1 tablet (2.5 mg total) by mouth daily. Patient taking differently: Take 2.5 mg by mouth every morning. 09/16/21   Teodora Medici, DO  amLODipine (NORVASC) 5 MG tablet Take 1 tablet (5 mg total) by mouth daily. Patient taking differently: Take 5 mg by mouth every morning. 09/16/21   Teodora Medici, DO  aspirin EC 81 MG tablet Take 81 mg by mouth daily.    [provider]  atorvastatin (LIPITOR) 40 MG tablet TAKE 1 TABLET BY MOUTH ONCE DAILY FOR CHOLESTEROL 11/09/21   Teodora Medici, DO  b complex vitamins capsule Take 1 capsule by mouth daily.    [provider]  cholecalciferol (VITAMIN D3) 25 MCG (1000 UNIT) tablet Take 1,000 Units by mouth daily.    [provider]  Cinnamon 500 MG  capsule Take 500 mg by mouth at bedtime.    [provider]  diphenhydrAMINE (BENADRYL) 25 mg capsule Take 25 mg by mouth every morning.    [provider]  DM-APAP-CPM (CORICIDIN HBP PO) Take 2 tablets by mouth daily as needed (allergies/cold symptoms).    [provider]  glucose blood (ONETOUCH VERIO) test strip USE 1 STRIP TO CHECK GLUCOSE ONCE DAILY 08/07/21   Teodora Medici, DO  ipratropium (ATROVENT) 0.03 % nasal spray Place 2 sprays into both nostrils 3 (three) times daily. Patient taking differently: Place 2 sprays into both nostrils daily as needed for rhinitis. 04/09/21   Tyler Pita, MD  losartan (COZAAR) 100 MG tablet Take 100 mg by mouth every morning.    [provider]  Multiple Vitamins-Minerals (MULTIVITAMIN WITH MINERALS) tablet Take 1 tablet by mouth daily.    [provider]  polycarbophil (FIBERCON) 625 MG tablet Take 625 mg by mouth 2 (two) times daily.    [provider]  polyvinyl alcohol (LIQUIFILM TEARS) 1.4 % ophthalmic solution Place 1 drop into both eyes as needed for dry eyes.    [provider]  vitamin C (ASCORBIC ACID) 500 MG tablet Take 500 mg by mouth daily.    [provider]  zinc sulfate 220 (50 Zn) MG capsule Take 1 capsule (220 mg total) by mouth daily. 06/18/19   Allie Bossier, MD     Family History  Problem Relation Age of Onset   Stroke Mother    Alzheimer's disease Mother    Hypertension Sister    Hyperlipidemia Sister    Hypertension Brother    Hyperlipidemia Brother    Diabetes Brother     Social History   Socioeconomic History   Marital status: Married    Spouse name: [name]   Number of children: 3   Years of education: Not on file   Highest education level: High school graduate  Occupational History   Not on file  Tobacco Use   Smoking status: Former    Packs/day: 1.00    Years: 40.00    Total pack years: 40.00    Types: Cigarettes    Quit date:  12/09/1998    Years since quitting: 23.0   Smokeless tobacco: Never  Vaping Use   Vaping Use: Never used  Substance and Sexual Activity   Alcohol use: No   Drug use: No   Sexual activity: Yes  Partners: Female  Other Topics Concern   Not on file  Social History Narrative   Not on file   Social Determinants of Health   Financial Resource Strain: Low Risk  (06/12/2018)   Overall Financial Resource Strain (CARDIA)    Difficulty of Paying Living Expenses: Not hard at all  Food Insecurity: No Food Insecurity (06/12/2018)   Hunger Vital Sign    Worried About Running Out of Food in the Last Year: Never true    [place] in the Last Year: Never true  Transportation Needs: No Transportation Needs (06/12/2018)   PRAPARE - Hydrologist (Medical): No    Lack of Transportation (Non-Medical): No  Physical Activity: Sufficiently Active (06/12/2018)   Exercise Vital Sign    Days of Exercise per Week: 7 days    Minutes of Exercise per Session: 60 min  Stress: No Stress Concern Present (06/12/2018)   [place]    Feeling of Stress : Not at all  Social Connections: Somewhat Isolated (06/12/2018)   Social Connection and Isolation Panel [NHANES]    Frequency of Communication with Friends and Family: More than three times a week    Frequency of Social Gatherings with Friends and Family: More than three times a week    Attends Religious Services: Never    Marine scientist or Organizations: No    Attends Music therapist: Never    Marital Status: Married    ECOG Status: 1 - Symptomatic but completely ambulatory  Review of Systems  Review of Systems: A 12 point ROS discussed and pertinent positives are indicated in the HPI above.  All other systems are negative.    Physical Exam No direct physical exam was performed telehealth visit only today to review interval surveillance  imaging. Vital Signs: There were no vitals taken for this visit.  Imaging: CT CHEST ABDOMEN PELVIS W CONTRAST  Result Date: 11/20/2021 CLINICAL DATA:  Metastatic neuroendocrine lung cancer, liver metastases, status post left upper lobectomy, Y 90 radioembolization * Tracking Code: BO * EXAM: CT CHEST, ABDOMEN, AND PELVIS WITH CONTRAST TECHNIQUE: Multidetector CT imaging of the chest, abdomen and pelvis was performed following the standard protocol during bolus administration of intravenous contrast. RADIATION DOSE REDUCTION: This exam was performed according to the departmental dose-optimization program which includes automated exposure control, adjustment of the mA and/or kV according to patient size and/or use of iterative reconstruction technique. CONTRAST:  79m OMNIPAQUE IOHEXOL 300 MG/ML SOLN, additional oral enteric contrast COMPARISON:  07/10/2021 FINDINGS: CT CHEST FINDINGS Cardiovascular: Aortic atherosclerosis. Normal heart size. Left and right coronary artery calcifications. No pericardial effusion. Mediastinum/Nodes: No enlarged mediastinal, hilar, or axillary lymph nodes. Thyroid gland, trachea, and esophagus demonstrate no significant findings. Lungs/Pleura: A previously noted soft tissue nodule in the anterior aspect of the left mainstem bronchus is no longer seen following bronchoscopic ablation (series 5, image 66). Unchanged postoperative findings status post left upper lobectomy. Moderate centrilobular and paraseptal emphysema. Unchanged irregular peripheral interstitial opacity, subpleural thickening, and areas of subpleural bronchiolectasis, as well as superimposed arcing, bandlike irregular peripheral interstitial opacity with subpleural sparing. No pleural effusion or pneumothorax. Musculoskeletal: No chest wall mass. CT ABDOMEN PELVIS FINDINGS Hepatobiliary: Unchanged subcapsular lesions of the anterior liver dome, hepatic segment VIII, measuring 3.2 x 2.1 cm (series 2, image 43) and  of anterior hepatic segment III measuring 2.0 x 1.8 cm (series 2, image 53). There are however at  least 2 new, subtle hypodense lesions of the right lobe of the liver, medial hepatic segment VII measuring proximally 2.0 x 1.9 cm (series 2, image 54), and of the inferior right lobe of the liver, hepatic segment VI, measuring 2.1 x 1.8 cm (series 2, image 58). Highly contracted or surgically absent gallbladder. No biliary ductal dilatation. Pancreas: Unremarkable. No pancreatic ductal dilatation or surrounding inflammatory changes. Spleen: Normal in size without significant abnormality. Adrenals/Urinary Tract: Adrenal glands are unremarkable. Kidneys are normal, without renal calculi, solid lesion, or hydronephrosis. Calculus in the dependent urinary bladder measuring 1.1 cm (series 2, image 109). Stomach/Bowel: Stomach is within normal limits. Appendix appears normal. No evidence of bowel wall thickening, distention, or inflammatory changes. Descending and sigmoid diverticulosis. Vascular/Lymphatic: Aortic atherosclerosis. Coil material in the vicinity of the gastroduodenal artery. No enlarged abdominal or pelvic lymph nodes. Reproductive: Prostatomegaly. Other: Fat containing left inguinal hernia.  No ascites. Musculoskeletal: No acute osseous findings. Multiple unchanged sclerotic osseous metastatic lesions, including of the sternal body, T1, T11, L2, and L3 vertebral bodies. Unchanged injection granulomata in the superficial soft tissues overlying the buttocks (series 2, image 118). IMPRESSION: 1. A previously noted soft tissue nodule in the anterior aspect of the left mainstem bronchus is no longer seen following bronchoscopic ablation. 2. Status post left upper lobectomy. No evidence of pulmonary metastatic disease. 3. At least two new, very subtle hypodense lesions of the right lobe of the liver, suspicious for worsened hepatic metastatic disease. These could be better characterized by multiphasic MRI of the  liver if desired. Other treated liver lesions are unchanged following Y 90 ablation. 4. Unchanged sclerotic osseous metastatic disease. 5. Emphysema. 6. Unchanged evidence of mild fibrotic interstitial disease, in a "probable UIP" pattern if characterized by ATS pulmonary fibrosis criteria, as well as probable superimposed chronic sequelae of prior COVID pneumonia. 7. Calculus in the dependent urinary bladder measuring 1.1 cm. 8. Coronary artery disease. Aortic Atherosclerosis (ICD10-I70.0) and Emphysema (ICD10-J43.9). Electronically Signed   By: Delanna Ahmadi M.D.   On: 11/20/2021 20:33    Labs:  CBC: Recent Labs    09/08/21 0918 10/06/21 0900 11/03/21 0909 12/01/21 1033  WBC 9.0 11.6* 10.9* 9.6  HGB 15.0 15.2 14.9 15.5  HCT 45.8 45.6 45.1 46.4  PLT 200 196 194 167    COAGS: No results for input(s): "INR", "APTT" in the last 8760 hours.  BMP: Recent Labs    09/08/21 0918 10/06/21 0900 11/03/21 0909 12/01/21 1033  NA 137 140 137 137  K 4.2 4.5 4.4 4.6  CL 103 106 104 106  CO2 _0 GLUCOSE 123* 151* 144* 98  BUN 21 21 24* 20  CALCIUM 9.6 9.3 9.2 9.3  CREATININE 1.01 1.05 1.05 0.95  GFRNONAA >60 >60 >60 >60    LIVER FUNCTION TESTS: Recent Labs    09/08/21 0918 10/06/21 0900 11/03/21 0909 12/01/21 1033  BILITOT 0.9 0.4 0.8 0.9  AST _1 ALT _2 ALKPHOS 134* 129* 138* 128*  PROT 7.1 6.9 7.0 7.0  ALBUMIN 4.1 3.9 4.0 4.1    Assessment and Plan:  Over 3 years status post whole liver Y90 radioembolization.  Interval surveillance CT demonstrates 2 hypodense ill-defined right hepatic lesions each measuring approximately 2 cm.  These remain indeterminate.  He remains asymptomatic and has a stable baseline functional status.  He continues on monthly Sandostatin injections.  Overall he is doing very well.  Treatment options were discussed including Lutathera  treatment and repeat Y90 radioembolization.  At this point, I would recommend a repeat  dotatate PET scan for neuroendocrine tumor to see if he would be a candidate for Lutathera treatment.  This can be scheduled in the next few weeks.  Plan: Outpatient dotatate PET scan for neuroendocrine tumor.    Electronically Signed: Greggory Keen 12/14/2021, 10:57 AM   I spent a total of    25 Minutes in remote  clinical consultation, greater than 50% of which was counseling/coordinating care for this patient with known metastatic neuroendocrine tumor.    Visit type: Audio only (telephone). Audio (no video) only due to patient's lack of internet/smartphone capability. Alternative for in-person consultation at [facility], [address], [place], [place]. This visit type was conducted due to national recommendations for restrictions regarding the COVID-19 Pandemic (e.g. social distancing).  This format is felt to be most appropriate for this patient at this time.  All issues noted in this document were discussed and addressed.

## 2021-12-15 ENCOUNTER — Other Ambulatory Visit: Payer: Self-pay

## 2021-12-15 MED ORDER — LOSARTAN POTASSIUM 100 MG PO TABS: 100.0000 mg | ORAL_TABLET | ORAL | 3 refills | Status: DC

## 2021-12-15 NOTE — Telephone Encounter (Signed)
Requested medication (s) are due for refill today: historical medication  Requested medication (s) are on the active medication list: yes  Last refill:  na  Future visit scheduled: yes in 1 week  Notes to clinic:  historical medication . Do you want to order Rx?     Requested Prescriptions  Pending Prescriptions Disp Refills   losartan (COZAAR) 100 MG tablet [Pharmacy Med Name: Losartan Potassium 100 MG Oral Tablet] 90 tablet 0    Sig: Take 1 tablet by mouth once daily     Cardiovascular:  Angiotensin Receptor Blockers Passed - 12/14/2021 11:09 AM      Passed - Cr in normal range and within 180 days    Creat  Date Value Ref Range Status  02/12/2016 0.88 0.70 - 1.18 mg/dL Final    Comment:      For patients > or = 79 years of age: The upper reference limit for Creatinine is approximately 13% higher for people identified as African-American.      Creatinine, Ser  Date Value Ref Range Status  12/01/2021 0.95 0.61 - 1.24 mg/dL Final   Creatinine, Urine  Date Value Ref Range Status  07/03/2019 22.13 mg/dL Final    Comment:    Performed at [facility] [address] [place], [place] [address]         Passed - K in normal range and within 180 days    Potassium  Date Value Ref Range Status  12/01/2021 4.6 3.5 - 5.1 mmol/L Final  03/30/2013 3.4 (L) 3.5 - 5.1 mmol/L Final         Passed - Patient is not pregnant      Passed - Last BP in normal range    BP Readings from Last 1 Encounters:  11/17/21 (!) 102/50         Passed - Valid encounter within last 6 months    Recent Outpatient Visits           3 months ago Essential hypertension   [facility] [place], DO   5 months ago Essential hypertension   [facility] Teodora Medici, DO   6 months ago Type 2 diabetes mellitus without complication, without long-term current use of insulin [facility])   [facility] Teodora Medici, DO   1 year  ago Type 2 diabetes mellitus without complication, without long-term current use of insulin [facility])   [facility] Myles Gip, DO   1 year ago Essential hypertension   [facility] [place], MD       Future Appointments             In 1 week Teodora Medici, [facility], [place]

## 2021-12-16 ENCOUNTER — Telehealth: Payer: Self-pay | Admitting: Internal Medicine

## 2021-12-16 NOTE — Telephone Encounter (Signed)
FYI; Pt wanted to let you know that he is not taking the Amlodipine any longer and his BP has been good.

## 2021-12-18 ENCOUNTER — Ambulatory Visit: Payer: Medicare Other | Admitting: Internal Medicine

## 2021-12-21 ENCOUNTER — Other Ambulatory Visit: Payer: Self-pay | Admitting: Interventional Radiology

## 2021-12-21 DIAGNOSIS — K769 Liver disease, unspecified: Secondary | ICD-10-CM

## 2021-12-28 ENCOUNTER — Encounter: Payer: Self-pay | Admitting: Internal Medicine

## 2021-12-28 ENCOUNTER — Encounter
Admission: RE | Admit: 2021-12-28 | Discharge: 2021-12-28 | Disposition: A | Discharge status: Home / Self Care | Payer: Medicare Other | Source: Ambulatory Visit | Attending: Interventional Radiology | Admitting: Interventional Radiology

## 2021-12-28 ENCOUNTER — Ambulatory Visit (INDEPENDENT_AMBULATORY_CARE_PROVIDER_SITE_OTHER): Payer: Medicare Other | Admitting: Internal Medicine

## 2021-12-28 VITALS — BP 132/78 | HR 66 | Temp 97.5°F | Resp 16 | Ht 64.0 in | Wt 156.8 lb

## 2021-12-28 DIAGNOSIS — C7A8 Other malignant neuroendocrine tumors: Secondary | ICD-10-CM | POA: Insufficient documentation

## 2021-12-28 DIAGNOSIS — I2510 Atherosclerotic heart disease of native coronary artery without angina pectoris: Secondary | ICD-10-CM | POA: Diagnosis not present

## 2021-12-28 DIAGNOSIS — I10 Essential (primary) hypertension: Secondary | ICD-10-CM | POA: Diagnosis not present

## 2021-12-28 DIAGNOSIS — E119 Type 2 diabetes mellitus without complications: Secondary | ICD-10-CM | POA: Diagnosis not present

## 2021-12-28 DIAGNOSIS — K769 Liver disease, unspecified: Secondary | ICD-10-CM | POA: Diagnosis present

## 2021-12-28 DIAGNOSIS — E782 Mixed hyperlipidemia: Secondary | ICD-10-CM | POA: Diagnosis not present

## 2021-12-28 DIAGNOSIS — M899 Disorder of bone, unspecified: Secondary | ICD-10-CM | POA: Insufficient documentation

## 2021-12-28 DIAGNOSIS — Z23 Encounter for immunization: Secondary | ICD-10-CM | POA: Diagnosis not present

## 2021-12-28 DIAGNOSIS — C787 Secondary malignant neoplasm of liver and intrahepatic bile duct: Secondary | ICD-10-CM | POA: Insufficient documentation

## 2021-12-28 DIAGNOSIS — Z1159 Encounter for screening for other viral diseases: Secondary | ICD-10-CM

## 2021-12-28 MED ORDER — ATORVASTATIN CALCIUM 40 MG PO TABS: 40.0000 mg | ORAL_TABLET | Freq: Every day | ORAL | 1 refills | Status: DC

## 2021-12-28 MED ORDER — LOSARTAN POTASSIUM 100 MG PO TABS: 100.0000 mg | ORAL_TABLET | ORAL | 1 refills | Status: DC

## 2021-12-28 MED ORDER — COPPER CU 64 DOTATATE 1 MCI/ML IV SOLN
4.0000 | Freq: Once | INTRAVENOUS | Status: AC
  Administered 2021-12-28: 4.34 via INTRAVENOUS

## 2021-12-28 NOTE — Progress Notes (Signed)
Established Patient Office Visit  Subjective:  Patient ID: Jason Malone, male    DOB: 20-Dec-1940  Age: 81 y.o. MRN: [id number]  CC:  Chief Complaint  Patient presents with   Follow-up   Hypertension   Hyperlipidemia   Diabetes    HPI Jason Malone presents for follow-up.  Hypertension/A.Flutter: -Medications: Losartan 100 mg, had been on amlodipine 7.5 mg but stopped taking it because his blood pressure has been good at home for the last month  -Patient is compliant with above medications and reports no side effects other than  mild BLE non-pitting edema, worse in the evenings.  -Checking BP at home (average): 130/70-80 -Denies any SOB, CP, vision changes or symptoms of hypotension.  HLD: -Medications: Lipitor 40 mg -Patient is compliant with above medications and reports no side effects.  -Last lipid panel: Lipid Panel     Component Value Date/Time   CHOL 83 06/15/2019 0114   CHOL 133 02/14/2017 1046   TRIG 44 06/15/2019 0114   HDL 32 (L) 06/15/2019 0114   HDL 39 (L) 02/14/2017 1046   CHOLHDL 2.6 06/15/2019 0114   VLDL 9 06/15/2019 0114   LDLCALC 42 06/15/2019 0114   LDLCALC 53 04/11/2019 0000   LABVLDL 24 02/14/2017 1046   Pre-Diabetes: -Last A1c 6.5 5/23 diabetic now   COPD/Metastatic Lung Cancer/Post-inflammatory Lung Fibrosis: -Following with Pulmonology and IR -Most recent CT 11/19/21 showing new ery subtle hypodense lesions of the right lobe of the liver, suspicious for worsened hepatic metabolic disease. He has an appointment with his medical oncologist, Dr. Grayland Malone in September and is following with Radiation Oncology to discuss further treatment options.   Past Medical History:  Diagnosis Date   Allergic rhinitis    Aortic atherosclerosis (HCC)    Benign prostatic hypertrophy with lower urinary tract symptoms (LUTS)    Bradycardia    Chest pain    a. 05/2013 MV: EF 70%, no ischemia.   Chronic respiratory failure with hypoxia (HCC)    COPD (chronic  obstructive pulmonary disease) (HCC)    Coronary artery disease    COVID-19    Diabetes mellitus without complication ([place])    last a1c 6.5 09-16-21 no meds   Diverticulitis    DIVERTICULOSIS   Dysrhythmia    Elevated LFTs    Enlarged prostate    Essential hypertension    CONTROLLED ON MEDS   Full dentures    H/O hypokalemia    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 08/28/2016   Hyperlipemia    Hypomagnesemia    IFG (impaired fasting glucose)    Liver cancer ([place]) 12/08/2016   Liver mass, left lobe 08/28/2016   Probably hemangioma; will get MRI of liver, refer to GI   Lung cancer ([place])    a. carcinoid, left lung, Stage 1b (T2a, N0, cM0);  b. 05/2013 s/p VATS & LULobectomy.   Metabolic acidosis    Monocytosis    Osteopenia    Paroxysmal atrial flutter (HCC)    a. 03/2013->no recurrence;  b. CHA2DS2VASc = 2-->not currently on anticoagulation;  c. 05/2012 Echo: EF 55-60%, normal RV.   Pneumonia    Postinflammatory pulmonary fibrosis (HCC)    Rectal bleeding    Smoking history    Wears glasses     Past Surgical History:  Procedure Laterality Date   BRONCHOSCOPY     04/06/2013   CARDIOVASCULAR STRESS TEST  05/2013   a. No evidence of ischemia or infarct, EF 70%, no WMAs  CATARACT EXTRACTION W/PHACO Left 07/14/2015   Procedure: CATARACT EXTRACTION PHACO AND INTRAOCULAR LENS PLACEMENT (IOC);  Surgeon: [name], MD;  Location: [place];  Service: Ophthalmology;  Laterality: Left;   CATARACT EXTRACTION W/PHACO Right 10/01/2015   Procedure: CATARACT EXTRACTION PHACO AND INTRAOCULAR LENS PLACEMENT (IOC);  Surgeon: [name], MD;  Location: [place];  Service: Ophthalmology;  Laterality: Right;   COLONOSCOPY W/ POLYPECTOMY     bleed after-had to go to surgery to stop bleeding via colonoscopy   COLONOSCOPY WITH PROPOFOL N/A 11/19/2015   Procedure: COLONOSCOPY WITH PROPOFOL;  Surgeon: [name], MD;  Location: [facility] END[facility]COPY;   Service: Endoscopy;  Laterality: N/A;   DUPUYTREN CONTRACTURE RELEASE  12/15/2011   Procedure: DUPUYTREN CONTRACTURE RELEASE;  Surgeon: [name], MD;  Location: [place];  Service: Orthopedics;  Laterality: Left;  Fasciotomy left ring finger dupuytrens   ELECTROMAGNETIC NAVIGATION BROCHOSCOPY Left 09/19/2019   Procedure: ELECTROMAGNETIC NAVIGATION BRONCHOSCOPY;  Surgeon: Tyler Pita, MD;  Location: [facility];  Service: Cardiopulmonary;  Laterality: Left;   FASCIECTOMY Right 11/07/2018   Procedure: SEGMENTAL FASCIECTOMY RIGHT RING FINGER;  Surgeon: Daryll Brod, MD;  Location: [place];  Service: Orthopedics;  Laterality: Right;  AXILLARY BLOCK   FLEXIBLE BRONCHOSCOPY Left 11/26/2019   Procedure: FLEXIBLE BRONCHOSCOPY WITH CRYO THERAPY;  Surgeon: Tyler Pita, MD;  Location: [facility];  Service: Cardiopulmonary;  Laterality: Left;   FLEXIBLE BRONCHOSCOPY Left 10/14/2021   Procedure: FLEXIBLE BRONCHOSCOPY;  Surgeon: Tyler Pita, MD;  Location: [facility];  Service: Pulmonary;  Laterality: Left;   IR ANGIOGRAM SELECTIVE EACH ADDITIONAL VESSEL  07/26/2017   IR ANGIOGRAM SELECTIVE EACH ADDITIONAL VESSEL  07/26/2017   IR ANGIOGRAM SELECTIVE EACH ADDITIONAL VESSEL  07/26/2017   IR ANGIOGRAM SELECTIVE EACH ADDITIONAL VESSEL  07/26/2017   IR ANGIOGRAM SELECTIVE EACH ADDITIONAL VESSEL  07/26/2017   IR ANGIOGRAM SELECTIVE EACH ADDITIONAL VESSEL  08/11/2017   IR ANGIOGRAM SELECTIVE EACH ADDITIONAL VESSEL  04/28/2018   IR ANGIOGRAM VISCERAL SELECTIVE  07/26/2017   IR ANGIOGRAM VISCERAL SELECTIVE  08/11/2017   IR ANGIOGRAM VISCERAL SELECTIVE  04/28/2018   IR EMBO ARTERIAL NOT HEMORR HEMANG INC GUIDE ROADMAPPING  07/26/2017   IR EMBO TUMOR ORGAN ISCHEMIA INFARCT INC GUIDE ROADMAPPING  08/11/2017   IR EMBO TUMOR ORGAN ISCHEMIA INFARCT INC GUIDE ROADMAPPING  04/28/2018   IR RADIOLOGIST EVAL & MGMT  07/07/2017   IR RADIOLOGIST EVAL & MGMT  09/07/2017   IR RADIOLOGIST EVAL &  MGMT  12/21/2017   IR RADIOLOGIST EVAL & MGMT  03/22/2018   IR RADIOLOGIST EVAL & MGMT  06/08/2018   IR RADIOLOGIST EVAL & MGMT  11/01/2018   IR RADIOLOGIST EVAL & MGMT  03/21/2019   IR RADIOLOGIST EVAL & MGMT  11/08/2019   IR RADIOLOGIST EVAL & MGMT  07/01/2020   IR RADIOLOGIST EVAL & MGMT  12/18/2020   IR RADIOLOGIST EVAL & MGMT  06/18/2021   IR RADIOLOGIST EVAL & MGMT  12/14/2021   IR US GUIDE VASC ACCESS RIGHT  07/26/2017   IR US GUIDE VASC ACCESS RIGHT  08/11/2017   IR US GUIDE VASC ACCESS RIGHT  04/28/2018   LUNG LOBECTOMY  06/10/13   upper left lung   MULTIPLE TOOTH EXTRACTIONS     REMOVAL RETAINED LENS Right 11/17/2015   Procedure: REMOVAL RETAINED LENS FROAGMENTS RIGHT EYE;  Surgeon: [name], MD;  Location: [place];  Service: Ophthalmology;  Laterality: Right;   TONSILLECTOMY  UPPER GASTROINTESTINAL ENDOSCOPY  11-03-15   Dr [name]   VASECTOMY     VIDEO ASSISTED THORACOSCOPY (VATS)/WEDGE RESECTION Left 06/04/2013   Procedure: VIDEO ASSISTED THORACOSCOPY (VATS)/WEDGE RESECTION;  Surgeon: [name], MD;  Location: [place];  Service: Thoracic;  Laterality: Left;   VIDEO BRONCHOSCOPY N/A 06/04/2013   Procedure: VIDEO BRONCHOSCOPY;  Surgeon: [name], MD;  Location: [place];  Service: Thoracic;  Laterality: N/A;   VIDEO BRONCHOSCOPY Left 08/12/2021   Procedure: VIDEO BRONCHOSCOPY WITHOUT FLUORO;  Surgeon: Tyler Pita, MD;  Location: [facility];  Service: Cardiopulmonary;  Laterality: Left;   VIDEO BRONCHOSCOPY Left 09/09/2021   Procedure: VIDEO BRONCHOSCOPY WITHOUT FLUORO;  Surgeon: Tyler Pita, MD;  Location: [facility];  Service: Cardiopulmonary;  Laterality: Left;    Family History  Problem Relation Age of Onset   Stroke Mother    Alzheimer's disease Mother    Hypertension Sister    Hyperlipidemia Sister    Hypertension Brother    Hyperlipidemia Brother    Diabetes Brother     Social History   Socioeconomic History   Marital status:  Married    Spouse name: [name]   Number of children: 3   Years of education: Not on file   Highest education level: High school graduate  Occupational History   Not on file  Tobacco Use   Smoking status: Former    Packs/day: 1.00    Years: 40.00    Total pack years: 40.00    Types: Cigarettes    Quit date: 12/09/1998    Years since quitting: 23.0   Smokeless tobacco: Never  Vaping Use   Vaping Use: Never used  Substance and Sexual Activity   Alcohol use: No   Drug use: No   Sexual activity: Yes    Partners: Female  Other Topics Concern   Not on file  Social History Narrative   Not on file   Social Determinants of Health   Financial Resource Strain: Low Risk  (06/12/2018)   Overall Financial Resource Strain (CARDIA)    Difficulty of Paying Living Expenses: Not hard at all  Food Insecurity: No Food Insecurity (06/12/2018)   Hunger Vital Sign    Worried About Running Out of Food in the Last Year: Never true    [place] in the Last Year: Never true  Transportation Needs: No Transportation Needs (06/12/2018)   PRAPARE - Hydrologist (Medical): No    Lack of Transportation (Non-Medical): No  Physical Activity: Sufficiently Active (06/12/2018)   Exercise Vital Sign    Days of Exercise per Week: 7 days    Minutes of Exercise per Session: 60 min  Stress: No Stress Concern Present (06/12/2018)   [place]    Feeling of Stress : Not at all  Social Connections: Somewhat Isolated (06/12/2018)   Social Connection and Isolation Panel [NHANES]    Frequency of Communication with Friends and Family: More than three times a week    Frequency of Social Gatherings with Friends and Family: More than three times a week    Attends Religious Services: Never    Marine scientist or Organizations: No    Attends Archivist Meetings: Never    Marital Status: Married  Human resources officer  Violence: Not At Risk (06/12/2018)   Humiliation, Afraid, Rape, and Kick questionnaire    Fear of Current or Ex-Partner: No    Emotionally  Abused: No    Physically Abused: No    Sexually Abused: No    Outpatient Medications Prior to Visit  Medication Sig Dispense Refill   aspirin EC 81 MG tablet Take 81 mg by mouth daily.     atorvastatin (LIPITOR) 40 MG tablet TAKE 1 TABLET BY MOUTH ONCE DAILY FOR CHOLESTEROL 90 tablet 0   b complex vitamins capsule Take 1 capsule by mouth daily.     cholecalciferol (VITAMIN D3) 25 MCG (1000 UNIT) tablet Take 1,000 Units by mouth daily.     Cinnamon 500 MG capsule Take 500 mg by mouth at bedtime.     diphenhydrAMINE (BENADRYL) 25 mg capsule Take 25 mg by mouth every morning.     DM-APAP-CPM (CORICIDIN HBP PO) Take 2 tablets by mouth daily as needed (allergies/cold symptoms).     glucose blood (ONETOUCH VERIO) test strip USE 1 STRIP TO CHECK GLUCOSE ONCE DAILY 100 each 3   ipratropium (ATROVENT) 0.03 % nasal spray Place 2 sprays into both nostrils 3 (three) times daily. (Patient taking differently: Place 2 sprays into both nostrils daily as needed for rhinitis.) 30 mL 3   losartan (COZAAR) 100 MG tablet Take 1 tablet (100 mg total) by mouth every morning. 30 tablet 3   Multiple Vitamins-Minerals (MULTIVITAMIN WITH MINERALS) tablet Take 1 tablet by mouth daily.     polycarbophil (FIBERCON) 625 MG tablet Take 625 mg by mouth 2 (two) times daily.     polyvinyl alcohol (LIQUIFILM TEARS) 1.4 % ophthalmic solution Place 1 drop into both eyes as needed for dry eyes.     vitamin C (ASCORBIC ACID) 500 MG tablet Take 500 mg by mouth daily.     zinc sulfate 220 (50 Zn) MG capsule Take 1 capsule (220 mg total) by mouth daily. 30 capsule 0   amLODipine (NORVASC) 2.5 MG tablet Take 1 tablet (2.5 mg total) by mouth daily. (Patient not taking: Reported on 12/28/2021) 90 tablet 0   amLODipine (NORVASC) 5 MG tablet Take 1 tablet (5 mg total) by mouth daily. (Patient not taking:  Reported on 12/28/2021) 90 tablet 0   Facility-Administered Medications Prior to Visit  Medication Dose Route Frequency Provider Last Rate Last Admin   lanreotide acetate (SOMATULINE DEPOT) injection 120 mg  120 mg Subcutaneous Once Lloyd Huger, MD        Allergies  Allergen Reactions   Tape Itching    Surgical tapes     ROS Review of Systems  Constitutional:  Negative for chills and fever.  Eyes:  Negative for visual disturbance.  Respiratory:  Negative for cough, shortness of breath and wheezing.   Cardiovascular:  Negative for chest pain.  Neurological:  Negative for dizziness.      Objective:    Physical Exam Constitutional:      Appearance: Normal appearance.  HENT:     Head: Normocephalic and atraumatic.     Mouth/Throat:     Comments: PND present Eyes:     Conjunctiva/sclera: Conjunctivae normal.  Cardiovascular:     Rate and Rhythm: Normal rate and regular rhythm.  Pulmonary:     Effort: Pulmonary effort is normal.     Breath sounds: Normal breath sounds.  Musculoskeletal:     Right lower leg: No edema.     Left lower leg: No edema.  Skin:    General: Skin is warm and dry.  Neurological:     General: No focal deficit present.     Mental Status: He is alert.  Mental status is at baseline.  Psychiatric:        Mood and Affect: Mood normal.        Behavior: Behavior normal.     BP 132/78   Pulse 66   Temp (!) 97.5 F (36.4 C)   Resp 16   Ht _0  (1.626 m)   Wt 156 lb 12.8 oz (71.1 kg)   SpO2 97%   BMI 26.91 kg/m  Wt Readings from Last 3 Encounters:  12/28/21 156 lb 12.8 oz (71.1 kg)  11/17/21 160 lb (72.6 kg)  11/03/21 157 lb 12.8 oz (71.6 kg)     Health Maintenance Due  Topic Date Due   Hepatitis C Screening  Never done   Zoster Vaccines- Shingrix (1 of 2) Never done   COVID-19 Vaccine (4 - Pfizer risk series) 06/09/2020   INFLUENZA VACCINE  12/08/2021    There are no preventive care reminders to display for this  patient.  Lab Results  Component Value Date   TSH 4.221 06/08/2013   Lab Results  Component Value Date   WBC 9.6 12/01/2021   HGB 15.5 12/01/2021   HCT 46.4 12/01/2021   MCV 92.2 12/01/2021   PLT 167 12/01/2021   Lab Results  Component Value Date   NA 137 12/01/2021   K 4.6 12/01/2021   CO2 27 12/01/2021   GLUCOSE 98 12/01/2021   BUN 20 12/01/2021   CREATININE 0.95 12/01/2021   BILITOT 0.9 12/01/2021   ALKPHOS 128 (H) 12/01/2021   AST 23 12/01/2021   ALT 22 12/01/2021   PROT 7.0 12/01/2021   ALBUMIN 4.1 12/01/2021   CALCIUM 9.3 12/01/2021   ANIONGAP 4 (L) 12/01/2021   Lab Results  Component Value Date   CHOL 83 06/15/2019   Lab Results  Component Value Date   HDL 32 (L) 06/15/2019   Lab Results  Component Value Date   LDLCALC 42 06/15/2019   Lab Results  Component Value Date   TRIG 44 06/15/2019   Lab Results  Component Value Date   CHOLHDL 2.6 06/15/2019   Lab Results  Component Value Date   HGBA1C 6.5 (A) 09/16/2021      Assessment & Plan:   1. Essential hypertension: Blood pressure stable today, continue Losartan 100 mg daily, refilled today. Discontinue Amlodipine, will keep it on his list in case he needs to restart it again. Continue to monitor blood pressure at home. Follow up in 6 months for recheck.  - losartan (COZAAR) 100 MG tablet; Take 1 tablet (100 mg total) by mouth every morning.  Dispense: 90 tablet; Refill: 1  2. Mixed hyperlipidemia/Coronary artery disease involving native coronary artery of native heart without angina pectoris: Recheck lipid panel today. Continue Lipitor 40 mg, refilled today.  - atorvastatin (LIPITOR) 40 MG tablet; Take 1 tablet (40 mg total) by mouth daily. for cholesterol.  Dispense: 90 tablet; Refill: 1  3. Type 2 diabetes mellitus without complication, without long-term current use of insulin ([place]): Recheck A1c today. Not currently on any medications.  - HgB A1c  4. Need for hepatitis C screening test:  Screening due.  - Hepatitis C Antibody  5. Vaccine for streptococcus pneumoniae and influenza: Prevnar 20 administered today.   - Pneumococcal conjugate vaccine 20-valent (Prevnar 20)   Follow-up: Return in about 6 months (around 06/30/2022).    Teodora Medici, DO

## 2021-12-28 NOTE — Patient Instructions (Addendum)
It was great seeing you today!  Plan discussed at today's visit: -Blood work ordered today, results will be uploaded to [facility].  -Medications refilled today -Continue to monitor blood pressure at home   Follow up in: 6 months   Take care and let us know if you have any questions or concerns prior to your next visit.  Dr. Rosana Berger

## 2021-12-29 ENCOUNTER — Other Ambulatory Visit: Payer: Self-pay

## 2021-12-29 ENCOUNTER — Inpatient Hospital Stay: Payer: Medicare Other | Attending: Oncology | Admitting: Medical Oncology

## 2021-12-29 ENCOUNTER — Inpatient Hospital Stay: Payer: Medicare Other

## 2021-12-29 DIAGNOSIS — Z79899 Other long term (current) drug therapy: Secondary | ICD-10-CM | POA: Insufficient documentation

## 2021-12-29 DIAGNOSIS — C7A8 Other malignant neuroendocrine tumors: Secondary | ICD-10-CM

## 2021-12-29 LAB — CBC WITH DIFFERENTIAL/PLATELET
Abs Immature Granulocytes: 0.03 10*3/uL (ref 0.00–0.07)
Basophils Absolute: 0.1 10*3/uL (ref 0.0–0.1)
Basophils Relative: 1 %
Eosinophils Absolute: 0.1 10*3/uL (ref 0.0–0.5)
Eosinophils Relative: 1 %
HCT: 47.8 % (ref 39.0–52.0)
Hemoglobin: 15.7 g/dL (ref 13.0–17.0)
Immature Granulocytes: 0 %
Lymphocytes Relative: 10 %
Lymphs Abs: 1 10*3/uL (ref 0.7–4.0)
MCH: 30.1 pg (ref 26.0–34.0)
MCHC: 32.8 g/dL (ref 30.0–36.0)
MCV: 91.6 fL (ref 80.0–100.0)
Monocytes Absolute: 1.6 10*3/uL — ABNORMAL HIGH (ref 0.1–1.0)
Monocytes Relative: 15 %
Neutro Abs: 7.7 10*3/uL (ref 1.7–7.7)
Neutrophils Relative %: 73 %
Platelets: 155 10*3/uL (ref 150–400)
RBC: 5.22 MIL/uL (ref 4.22–5.81)
RDW: 14.3 % (ref 11.5–15.5)
WBC: 10.6 10*3/uL — ABNORMAL HIGH (ref 4.0–10.5)
nRBC: 0 % (ref 0.0–0.2)

## 2021-12-29 LAB — LIPID PANEL
Cholesterol: 115 mg/dL (ref <200)
HDL: 41 mg/dL (ref >=40)
LDL Cholesterol (Calc): 54 mg/dL (calc)
Non-HDL Cholesterol (Calc): 74 mg/dL (calc) (ref <130)
Total CHOL/HDL Ratio: 2.8 (calc) (ref <5.0)
Triglycerides: 113 mg/dL (ref <150)

## 2021-12-29 LAB — COMPREHENSIVE METABOLIC PANEL
ALT: 25 U/L (ref 0–44)
AST: 22 U/L (ref 15–41)
Albumin: 4.1 g/dL (ref 3.5–5.0)
Alkaline Phosphatase: 136 U/L — ABNORMAL HIGH (ref 38–126)
Anion gap: 6 (ref 5–15)
BUN: 19 mg/dL (ref 8–23)
CO2: 28 mmol/L (ref 22–32)
Calcium: 9.2 mg/dL (ref 8.9–10.3)
Chloride: 105 mmol/L (ref 98–111)
Creatinine, Ser: 1.02 mg/dL (ref 0.61–1.24)
GFR, Estimated: >60 mL/min (ref >60)
Glucose, Bld: 122 mg/dL — ABNORMAL HIGH (ref 70–99)
Potassium: 4.4 mmol/L (ref 3.5–5.1)
Sodium: 139 mmol/L (ref 135–145)
Total Bilirubin: 0.9 mg/dL (ref 0.3–1.2)
Total Protein: 6.9 g/dL (ref 6.5–8.1)

## 2021-12-29 LAB — HEMOGLOBIN A1C
Hgb A1c MFr Bld: 6.5 % of total Hgb — ABNORMAL HIGH (ref <5.7)
Mean Plasma Glucose: 140 mg/dL
eAG (mmol/L): 7.7 mmol/L

## 2021-12-29 LAB — HEPATITIS C ANTIBODY: Hepatitis C Ab: NONREACTIVE

## 2021-12-29 MED ORDER — LANREOTIDE ACETATE 120 MG/0.5ML ~~LOC~~ SOLN
120.0000 mg | Freq: Once | SUBCUTANEOUS | Status: AC
  Administered 2021-12-29: 120 mg via SUBCUTANEOUS
  Filled 2021-12-29: qty 120

## 2021-12-30 ENCOUNTER — Other Ambulatory Visit: Payer: Medicare Other

## 2021-12-30 LAB — CHROMOGRANIN A: Chromogranin A (ng/mL): 1208 ng/mL — ABNORMAL HIGH (ref 0.0–101.8)

## 2021-12-31 ENCOUNTER — Ambulatory Visit
Admission: RE | Admit: 2021-12-31 | Discharge: 2021-12-31 | Disposition: A | Discharge status: Home / Self Care | Payer: Medicare Other | Source: Ambulatory Visit | Attending: Interventional Radiology | Admitting: Interventional Radiology

## 2021-12-31 DIAGNOSIS — K769 Liver disease, unspecified: Secondary | ICD-10-CM

## 2021-12-31 DIAGNOSIS — C7A8 Other malignant neuroendocrine tumors: Secondary | ICD-10-CM

## 2021-12-31 HISTORY — PX: IR RADIOLOGIST EVAL & MGMT: IMG5224

## 2021-12-31 NOTE — Progress Notes (Signed)
Patient ID: Jason Malone, male   DOB: Sep 21, 1940, 81 y.o.   MRN: [id number]       Chief Complaint:  Metastatic neuroendocrine tumor to the liver  Referring Physician(s): Dr. Grayland Ormond  History of Present Illness: Jason Malone is a 81 y.o. 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  He is approximately 3 years status post whole liver Y90 radioembolization.  He is closely followed as an outpatient with surveillance imaging.  He continues to do very well clinically with an excellent functional status.  He is followed closely at [facility] by Dr. Patsey Berthold and pulmonary and Dr. Grayland Ormond and oncology.  Most recently he did have bronchoscopy and cryo therapy to residual neuroendocrine tumor in the left mainstem bronchus back in June.  Dr. Grayland Ormond continues to see him for his monthly Sandostatin injections.  Overall he does not have any significant symptoms from the neuroendocrine tumor.  He had a recent CT scan 11/19/2021 which demonstrated 2 new possible hypodense right hepatic lesions.  Treated lesions in the liver remained stable.  No new extrahepatic disease or ascites.  No signs of cirrhosis.  Since her last visit, he has had a nuclear medicine PET scan (Detectnet) which is specific for neuroendocrine tumor.  The PET scan is positive for 2 small foci of activity in the liver 1 in the right lobe and a second in the left lobe.  No signs of mesenteric or nodal disease.  No peritoneal disease.  2 small discrete foci are within the medullary space of the right humerus as well as some activity in the right iliac bone suspicious for small bone metastases.  No corresponding cortical breakthrough or destruction.  Again, these bone lesions are small.  No pathologic fracture.  No soft tissue component.  After our discussion, he has no associated bone pain in these regions.  Past Medical History:  Diagnosis Date   Allergic rhinitis    Aortic atherosclerosis  (HCC)    Benign prostatic hypertrophy with lower urinary tract symptoms (LUTS)    Bradycardia    Chest pain    a. 05/2013 MV: EF 70%, no ischemia.   Chronic respiratory failure with hypoxia (HCC)    COPD (chronic obstructive pulmonary disease) (HCC)    Coronary artery disease    COVID-19    Diabetes mellitus without complication ([place])    last a1c 6.5 09-16-21 no meds   Diverticulitis    DIVERTICULOSIS   Dysrhythmia    Elevated LFTs    Enlarged prostate    Essential hypertension    CONTROLLED ON MEDS   Full dentures    H/O hypokalemia    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 08/28/2016   Hyperlipemia    Hypomagnesemia    IFG (impaired fasting glucose)    Liver cancer ([place]) 12/08/2016   Liver mass, left lobe 08/28/2016   Probably hemangioma; will get MRI of liver, refer to GI   Lung cancer ([place])    a. carcinoid, left lung, Stage 1b (T2a, N0, cM0);  b. 05/2013 s/p VATS & LULobectomy.   Metabolic acidosis    Monocytosis    Osteopenia    Paroxysmal atrial flutter (HCC)    a. 03/2013->no recurrence;  b. CHA2DS2VASc = 2-->not currently on anticoagulation;  c. 05/2012 Echo: EF 55-60%, normal RV.   Pneumonia    Postinflammatory pulmonary fibrosis (HCC)    Rectal bleeding    Smoking history    Wears glasses  Past Surgical History:  Procedure Laterality Date   BRONCHOSCOPY     04/06/2013   CARDIOVASCULAR STRESS TEST  05/2013   a. No evidence of ischemia or infarct, EF 70%, no WMAs   CATARACT EXTRACTION W/PHACO Left 07/14/2015   Procedure: CATARACT EXTRACTION PHACO AND INTRAOCULAR LENS PLACEMENT (IOC);  Surgeon: [name], MD;  Location: [place];  Service: Ophthalmology;  Laterality: Left;   CATARACT EXTRACTION W/PHACO Right 10/01/2015   Procedure: CATARACT EXTRACTION PHACO AND INTRAOCULAR LENS PLACEMENT (IOC);  Surgeon: [name], MD;  Location: [place];  Service: Ophthalmology;  Laterality: Right;   COLONOSCOPY W/  POLYPECTOMY     bleed after-had to go to surgery to stop bleeding via colonoscopy   COLONOSCOPY WITH PROPOFOL N/A 11/19/2015   Procedure: COLONOSCOPY WITH PROPOFOL;  Surgeon: [name], MD;  Location: [facility] END[facility];  Service: Endoscopy;  Laterality: N/A;   DUPUYTREN CONTRACTURE RELEASE  12/15/2011   Procedure: DUPUYTREN CONTRACTURE RELEASE;  Surgeon: [name], MD;  Location: [place];  Service: Orthopedics;  Laterality: Left;  Fasciotomy left ring finger dupuytrens   ELECTROMAGNETIC NAVIGATION BROCHOSCOPY Left 09/19/2019   Procedure: ELECTROMAGNETIC NAVIGATION BRONCHOSCOPY;  Surgeon: Tyler Pita, MD;  Location: [facility];  Service: Cardiopulmonary;  Laterality: Left;   FASCIECTOMY Right 11/07/2018   Procedure: SEGMENTAL FASCIECTOMY RIGHT RING FINGER;  Surgeon: Daryll Brod, MD;  Location: [place];  Service: Orthopedics;  Laterality: Right;  AXILLARY BLOCK   FLEXIBLE BRONCHOSCOPY Left 11/26/2019   Procedure: FLEXIBLE BRONCHOSCOPY WITH CRYO THERAPY;  Surgeon: Tyler Pita, MD;  Location: [facility];  Service: Cardiopulmonary;  Laterality: Left;   FLEXIBLE BRONCHOSCOPY Left 10/14/2021   Procedure: FLEXIBLE BRONCHOSCOPY;  Surgeon: Tyler Pita, MD;  Location: [facility];  Service: Pulmonary;  Laterality: Left;   IR ANGIOGRAM SELECTIVE EACH ADDITIONAL VESSEL  07/26/2017   IR ANGIOGRAM SELECTIVE EACH ADDITIONAL VESSEL  07/26/2017   IR ANGIOGRAM SELECTIVE EACH ADDITIONAL VESSEL  07/26/2017   IR ANGIOGRAM SELECTIVE EACH ADDITIONAL VESSEL  07/26/2017   IR ANGIOGRAM SELECTIVE EACH ADDITIONAL VESSEL  07/26/2017   IR ANGIOGRAM SELECTIVE EACH ADDITIONAL VESSEL  08/11/2017   IR ANGIOGRAM SELECTIVE EACH ADDITIONAL VESSEL  04/28/2018   IR ANGIOGRAM VISCERAL SELECTIVE  07/26/2017   IR ANGIOGRAM VISCERAL SELECTIVE  08/11/2017   IR ANGIOGRAM VISCERAL SELECTIVE  04/28/2018   IR EMBO ARTERIAL NOT HEMORR HEMANG INC GUIDE ROADMAPPING  07/26/2017   IR EMBO TUMOR ORGAN  ISCHEMIA INFARCT INC GUIDE ROADMAPPING  08/11/2017   IR EMBO TUMOR ORGAN ISCHEMIA INFARCT INC GUIDE ROADMAPPING  04/28/2018   IR RADIOLOGIST EVAL & MGMT  07/07/2017   IR RADIOLOGIST EVAL & MGMT  09/07/2017   IR RADIOLOGIST EVAL & MGMT  12/21/2017   IR RADIOLOGIST EVAL & MGMT  03/22/2018   IR RADIOLOGIST EVAL & MGMT  06/08/2018   IR RADIOLOGIST EVAL & MGMT  11/01/2018   IR RADIOLOGIST EVAL & MGMT  03/21/2019   IR RADIOLOGIST EVAL & MGMT  11/08/2019   IR RADIOLOGIST EVAL & MGMT  07/01/2020   IR RADIOLOGIST EVAL & MGMT  12/18/2020   IR RADIOLOGIST EVAL & MGMT  06/18/2021   IR RADIOLOGIST EVAL & MGMT  12/14/2021   IR US GUIDE VASC ACCESS RIGHT  07/26/2017   IR US GUIDE VASC ACCESS RIGHT  08/11/2017   IR US GUIDE VASC ACCESS RIGHT  04/28/2018   LUNG LOBECTOMY  06/10/13   upper left lung   MULTIPLE TOOTH EXTRACTIONS  REMOVAL RETAINED LENS Right 11/17/2015   Procedure: REMOVAL RETAINED LENS FROAGMENTS RIGHT EYE;  Surgeon: [name], MD;  Location: [place];  Service: Ophthalmology;  Laterality: Right;   TONSILLECTOMY     UPPER GASTROINTESTINAL ENDOSCOPY  11-03-15   Dr [name]   VASECTOMY     VIDEO ASSISTED THORACOSCOPY (VATS)/WEDGE RESECTION Left 06/04/2013   Procedure: VIDEO ASSISTED THORACOSCOPY (VATS)/WEDGE RESECTION;  Surgeon: [name], MD;  Location: [place];  Service: Thoracic;  Laterality: Left;   VIDEO BRONCHOSCOPY N/A 06/04/2013   Procedure: VIDEO BRONCHOSCOPY;  Surgeon: [name], MD;  Location: [place];  Service: Thoracic;  Laterality: N/A;   VIDEO BRONCHOSCOPY Left 08/12/2021   Procedure: VIDEO BRONCHOSCOPY WITHOUT FLUORO;  Surgeon: Tyler Pita, MD;  Location: [facility];  Service: Cardiopulmonary;  Laterality: Left;   VIDEO BRONCHOSCOPY Left 09/09/2021   Procedure: VIDEO BRONCHOSCOPY WITHOUT FLUORO;  Surgeon: Tyler Pita, MD;  Location: [facility];  Service: Cardiopulmonary;  Laterality: Left;    Allergies: Tape  Medications: Prior to Admission  medications   Medication Sig Start Date End Date Taking? Authorizing Provider  amLODipine (NORVASC) 2.5 MG tablet Take 1 tablet (2.5 mg total) by mouth daily. Patient not taking: Reported on 12/28/2021 09/16/21   Teodora Medici, DO  amLODipine (NORVASC) 5 MG tablet Take 1 tablet (5 mg total) by mouth daily. Patient not taking: Reported on 12/28/2021 09/16/21   Teodora Medici, DO  aspirin EC 81 MG tablet Take 81 mg by mouth daily.    [provider]  atorvastatin (LIPITOR) 40 MG tablet Take 1 tablet (40 mg total) by mouth daily. for cholesterol. 12/28/21   Teodora Medici, DO  b complex vitamins capsule Take 1 capsule by mouth daily.    [provider]  cholecalciferol (VITAMIN D3) 25 MCG (1000 UNIT) tablet Take 1,000 Units by mouth daily.    [provider]  Cinnamon 500 MG capsule Take 500 mg by mouth at bedtime.    [provider]  diphenhydrAMINE (BENADRYL) 25 mg capsule Take 25 mg by mouth every morning.    [provider]  DM-APAP-CPM (CORICIDIN HBP PO) Take 2 tablets by mouth daily as needed (allergies/cold symptoms).    [provider]  glucose blood (ONETOUCH VERIO) test strip USE 1 STRIP TO CHECK GLUCOSE ONCE DAILY 08/07/21   Teodora Medici, DO  ipratropium (ATROVENT) 0.03 % nasal spray Place 2 sprays into both nostrils 3 (three) times daily. Patient taking differently: Place 2 sprays into both nostrils daily as needed for rhinitis. 04/09/21   Tyler Pita, MD  losartan (COZAAR) 100 MG tablet Take 1 tablet (100 mg total) by mouth every morning. 12/28/21   Teodora Medici, DO  Multiple Vitamins-Minerals (MULTIVITAMIN WITH MINERALS) tablet Take 1 tablet by mouth daily.    [provider]  polycarbophil (FIBERCON) 625 MG tablet Take 625 mg by mouth 2 (two) times daily.    [provider]  polyvinyl alcohol (LIQUIFILM TEARS) 1.4 % ophthalmic solution Place 1 drop into both eyes as needed for dry eyes.     [provider]  vitamin C (ASCORBIC ACID) 500 MG tablet Take 500 mg by mouth daily.    [provider]  zinc sulfate 220 (50 Zn) MG capsule Take 1 capsule (220 mg total) by mouth daily. 06/18/19   Allie Bossier, MD     Family History  Problem Relation Age of Onset   Stroke Mother    Alzheimer's disease Mother  Hypertension Sister    Hyperlipidemia Sister    Hypertension Brother    Hyperlipidemia Brother    Diabetes Brother     Social History   Socioeconomic History   Marital status: Married    Spouse name: [name]   Number of children: 3   Years of education: Not on file   Highest education level: High school graduate  Occupational History   Not on file  Tobacco Use   Smoking status: Former    Packs/day: 1.00    Years: 40.00    Total pack years: 40.00    Types: Cigarettes    Quit date: 12/09/1998    Years since quitting: 23.0   Smokeless tobacco: Never  Vaping Use   Vaping Use: Never used  Substance and Sexual Activity   Alcohol use: No   Drug use: No   Sexual activity: Yes    Partners: Female  Other Topics Concern   Not on file  Social History Narrative   Not on file   Social Determinants of Health   Financial Resource Strain: Low Risk  (06/12/2018)   Overall Financial Resource Strain (CARDIA)    Difficulty of Paying Living Expenses: Not hard at all  Food Insecurity: No Food Insecurity (06/12/2018)   Hunger Vital Sign    Worried About Running Out of Food in the Last Year: Never true    [place] in the Last Year: Never true  Transportation Needs: No Transportation Needs (06/12/2018)   PRAPARE - Hydrologist (Medical): No    Lack of Transportation (Non-Medical): No  Physical Activity: Sufficiently Active (06/12/2018)   Exercise Vital Sign    Days of Exercise per Week: 7 days    Minutes of Exercise per Session: 60 min  Stress: No Stress Concern Present (06/12/2018)   [place]    Feeling of Stress : Not at all  Social Connections: Somewhat Isolated (06/12/2018)   Social Connection and Isolation Panel [NHANES]    Frequency of Communication with Friends and Family: More than three times a week    Frequency of Social Gatherings with Friends and Family: More than three times a week    Attends Religious Services: Never    Marine scientist or Organizations: No    Attends Music therapist: Never    Marital Status: Married    ECO1 - Symptomatic but completely ambulatoryG Status: 1 - Symptomatic but completely ambulatory  Review of Systems  Review of Systems: A 12 point ROS discussed and pertinent positives are indicated in the HPI above.  All other systems are negative.    Physical Exam No direct physical exam was performed, telephone health visit only today to review nuclear medicine PET imaging Vital Signs: There were no vitals taken for this visit.  Imaging: NM PET (CU-64 DETECTNET)SKULL TO MID THIGH  Result Date: 12/28/2021 CLINICAL DATA:  Well differentiated neuroendocrine tumor with liver metastasis. Surveillance study. Post multiple yttrium 90 microsphere radio embolization treatments to hepatic metastatic lesions. EXAM: NUCLEAR MEDICINE PET SKULL BASE TO THIGH TECHNIQUE: 4.3 mCi copper 49 DOTATATE was injected intravenously. Full-ring PET imaging was performed from the skull base to thigh after the radiotracer. CT data was obtained and used for attenuation correction and anatomic localization. COMPARISON:  CT 11/19/2021, CT 11/02/2019, MRI 03/15/2019, DOTATATE PET scan 12/12/2017. FINDINGS: NECK No radiotracer activity in neck lymph nodes. Incidental CT findings: None CHEST No radiotracer accumulation within  mediastinal or hilar lymph nodes. No suspicious pulmonary nodules on the CT scan. Incidental CT finding:None ABDOMEN/PELVIS Focus of radiotracer activity elevated above normal background liver within the  medial margin of the lateral segment LEFT hepatic lobe with SUV max equal 9.1 (image 123) this corresponds to exophytic nodule on comparison CT (image 53/2) Second lesion in the posterior ref RIGHT hepatic lobe measuring approximately 1-2 cm (SUV max equal 7.0 on image 129) also corresponds to hypodense lesion on comparison CT. No radiotracer avid periportal nodes. No radiotracer avid implants within the mesentery. No pelvic adenopathy. No peritoneal deposits. Physiologic activity noted in the liver, spleen, adrenal glands and kidneys. Incidental CT findings:Intense activity within the prostate gland favored benign. SKELETON Focus of radiotracer activity in the proximal medullary space of the RIGHT humerus with SUV max equal 5.1 on image 36. There is soft tissue hazy density hazy is a soft tissue density lesion within the medullary space at this level. Second similar lesion in the midshaft RIGHT humerus with SUV max equal 3.8 on image 20. There is mild activity within the RIGHT iliac bone just above the acetabulum which may be degenerative (SUV max equal 4.2 on image 210. Focal activity in the RIGHT iliac bone adjacent the SI joint with very low activity (SUV max equal 3.0. Incidental CT findings:None IMPRESSION: 1. Two foci of radiotracer activity within the liver which correspond to lesions on CT 11/19/2021 are consistent with well differentiated neuroendocrine tumor. 2. No evidence of mesenteric, nodal, or peritoneal metastasis. 3. Two discrete lesions within the medullary space of the RIGHT humerus. Activity less than expected for well differentiated neuroendocrine tumor metastasis, however, neuroendocrine tumor of lung often has less activity than lesions GI origin NET. Findings concerning for metastasis. 4. Two lesions in the RIGHT iliac bone are indeterminate. Electronically Signed   By: Suzy Bouchard M.D.   On: 12/28/2021 14:49   IR Radiologist Eval & Mgmt  Result Date: 12/14/2021 Please refer to notes  tab for details about interventional procedure. (Op Note)   Labs:  CBC: Recent Labs    10/06/21 0900 11/03/21 0909 12/01/21 1033 12/29/21 1059  WBC 11.6* 10.9* 9.6 10.6*  HGB 15.2 14.9 15.5 15.7  HCT 45.6 45.1 46.4 47.8  PLT 196 194 167 155    COAGS: No results for input(s): "INR", "APTT" in the last 8760 hours.  BMP: Recent Labs    10/06/21 0900 11/03/21 0909 12/01/21 1033 12/29/21 1059  NA 140 137 137 139  K 4.5 4.4 4.6 4.4  CL 106 104 106 105  CO2 _0 GLUCOSE 151* 144* 98 122*  BUN 21 24* 20 19  CALCIUM 9.3 9.2 9.3 9.2  CREATININE 1.05 1.05 0.95 1.02  GFRNONAA >60 >60 >60 >60    LIVER FUNCTION TESTS: Recent Labs    10/06/21 0900 11/03/21 0909 12/01/21 1033 12/29/21 1059  BILITOT 0.4 0.8 0.9 0.9  AST _1 ALT _2 ALKPHOS 129* 138* 128* 136*  PROT 6.9 7.0 7.0 6.9  ALBUMIN 3.9 4.0 4.1 4.1    TUMOR MARKERS:  Chromogranin A from 2 days ago is 1208, 1 month ago 551.  Assessment and Plan:  Over 3 years status post whole liver Y90 radioembolization.  Interval surveillance CT demonstrated 2 hypodense ill-defined right hepatic lesions approximate 2 cm each which remained indeterminate.  This led to a nuclear medicine PET scan (Detectnet) specific for neuroendocrine tumor.  PET scan was positive for 2 residual  liver lesions as well as small intramedullary bone lesions in the right humerus and right pelvis without associated pathologic fracture.  Overall very low active tumor burden on the PET.  He remains asymptomatic.  No associated bone pain.  He continues on monthly Sandostatin injections which he tolerates well.  Treatment options were again reviewed including Lutathera treatment.  This treatment is performed at [facility].  Patients can receive 4 treatments every 8 weeks to complete Lutathera treatment.  This treatment was discussed in some detail.  Given the fairly low tumor burden in the liver I think this would be a  better option at this time rather than repeat Y90 radioembolization.  After our discussion, they would like to proceed with a Lutathera treatment consult with Dr. Ilda Foil in nuclear medicine.  All questions and concerns addressed.  This can be scheduled in the next few weeks.  Plan: Scheduled for Lutathera treatment consult with Dr. Amalia Hailey at [facility] in nuclear medicine.   Electronically Signed: Greggory Keen 12/31/2021, 10:43 AM   I spent a total of    40 Minutes in remote  clinical consultation, greater than 50% of which was counseling/coordinating care for this patient with metastatic neuroendocrine tumor..    Visit type: Audio only (telephone). Audio (no video) only due to patient's lack of internet/smartphone capability. Alternative for in-person consultation at [facility], [address], [place], [place]. This visit type was conducted due to national recommendations for restrictions regarding the COVID-19 Pandemic (e.g. social distancing).  This format is felt to be most appropriate for this patient at this time.  All issues noted in this document were discussed and addressed.

## 2021-12-31 NOTE — Progress Notes (Signed)
Mr. Dozal your Chromograin A level has significantly increased. I have asked our schedulers to schedule you a follow up with Dr. Grayland Ormond to discuss this change.  Nelwyn Salisbury PA-C

## 2022-01-04 NOTE — Progress Notes (Signed)
[place]  [phone]) [phone] Fax:(336) [phone]  ID: Jason Malone OB: 08/26/1940  MR#: [id number]  NGE#:[id number]  Patient Care Team: Teodora Medici, DO as PCP - General (Internal Medicine) Minna Merritts, MD as PCP - Cardiology (Cardiology) Byrnett, Forest Gleason, MD (General Surgery) Nestor Lewandowsky, MD (Inactive) as Referring Physician (Cardiothoracic Surgery) Minna Merritts, MD as Consulting Physician (Cardiology) [name], MD (Inactive) as Consulting Physician (Cardiothoracic Surgery) Lloyd Huger, MD as Consulting Physician (Oncology) Sanda Klein Satira Anis, MD as Attending Physician (Family Medicine)   CHIEF COMPLAINT: 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  INTERVAL HISTORY: Patient returns to clinic today for further evaluation, discussion of his laboratory and imaging results, and whether or not to pursue the recommended Lutathera treatment.  He continues to feel well and remains asymptomatic.  He is fully recovered from his COVID infection and is back to his baseline.  He does not complain of any weakness or fatigue.  He continues to have chronic cough. He denies any pain. He has no neurologic complaints.  He denies any recent fevers or illnesses.  He has a good appetite and denies weight loss.  He denies any chest pain, shortness of breath, or hemoptysis.  He denies any abdominal pain.  He has no nausea, vomiting, constipation, or diarrhea.  He has no urinary complaints.  Patient offers no further specific complaints today.  REVIEW OF SYSTEMS:   Review of Systems  Constitutional: Negative.  Negative for fever, malaise/fatigue and weight loss.  Respiratory:  Positive for cough. Negative for hemoptysis and shortness of breath.   Cardiovascular: Negative.  Negative for chest pain and leg swelling.  Gastrointestinal: Negative.  Negative for abdominal pain, diarrhea, nausea and vomiting.  Genitourinary: Negative.   Negative for dysuria.  Musculoskeletal: Negative.  Negative for back pain.  Skin: Negative.  Negative for rash.  Neurological: Negative.  Negative for dizziness, sensory change, focal weakness and weakness.  Psychiatric/Behavioral: Negative.  The patient is not nervous/anxious.     As per HPI. Otherwise, a complete review of systems is negative.  PAST MEDICAL HISTORY: Past Medical History:  Diagnosis Date   Allergic rhinitis    Aortic atherosclerosis (HCC)    Benign prostatic hypertrophy with lower urinary tract symptoms (LUTS)    Bradycardia    Chest pain    a. 05/2013 MV: EF 70%, no ischemia.   Chronic respiratory failure with hypoxia (HCC)    COPD (chronic obstructive pulmonary disease) (HCC)    Coronary artery disease    COVID-19    Diabetes mellitus without complication ([place])    last a1c 6.5 09-16-21 no meds   Diverticulitis    DIVERTICULOSIS   Dysrhythmia    Elevated LFTs    Enlarged prostate    Essential hypertension    CONTROLLED ON MEDS   Full dentures    H/O hypokalemia    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 08/28/2016   Hyperlipemia    Hypomagnesemia    IFG (impaired fasting glucose)    Liver cancer ([place]) 12/08/2016   Liver mass, left lobe 08/28/2016   Probably hemangioma; will get MRI of liver, refer to GI   Lung cancer ([place])    a. carcinoid, left lung, Stage 1b (T2a, N0, cM0);  b. 05/2013 s/p VATS & LULobectomy.   Metabolic acidosis    Monocytosis    Osteopenia    Paroxysmal atrial flutter (HCC)    a. 03/2013->no recurrence;  b.  CHA2DS2VASc = 2-->not currently on anticoagulation;  c. 05/2012 Echo: EF 55-60%, normal RV.   Pneumonia    Postinflammatory pulmonary fibrosis (HCC)    Rectal bleeding    Smoking history    Wears glasses     PAST SURGICAL HISTORY: Past Surgical History:  Procedure Laterality Date   BRONCHOSCOPY     04/06/2013   CARDIOVASCULAR STRESS TEST  05/2013   a. No evidence of ischemia or infarct, EF 70%, no WMAs    CATARACT EXTRACTION W/PHACO Left 07/14/2015   Procedure: CATARACT EXTRACTION PHACO AND INTRAOCULAR LENS PLACEMENT (IOC);  Surgeon: [name], MD;  Location: [place];  Service: Ophthalmology;  Laterality: Left;   CATARACT EXTRACTION W/PHACO Right 10/01/2015   Procedure: CATARACT EXTRACTION PHACO AND INTRAOCULAR LENS PLACEMENT (IOC);  Surgeon: [name], MD;  Location: [place];  Service: Ophthalmology;  Laterality: Right;   COLONOSCOPY W/ POLYPECTOMY     bleed after-had to go to surgery to stop bleeding via colonoscopy   COLONOSCOPY WITH PROPOFOL N/A 11/19/2015   Procedure: COLONOSCOPY WITH PROPOFOL;  Surgeon: [name], MD;  Location: [facility] ENDOSCOPY;  Service: Endoscopy;  Laterality: N/A;   DUPUYTREN CONTRACTURE RELEASE  12/15/2011   Procedure: DUPUYTREN CONTRACTURE RELEASE;  Surgeon: [name], MD;  Location: [place];  Service: Orthopedics;  Laterality: Left;  Fasciotomy left ring finger dupuytrens   ELECTROMAGNETIC NAVIGATION BROCHOSCOPY Left 09/19/2019   Procedure: ELECTROMAGNETIC NAVIGATION BRONCHOSCOPY;  Surgeon: Tyler Pita, MD;  Location: [facility];  Service: Cardiopulmonary;  Laterality: Left;   FASCIECTOMY Right 11/07/2018   Procedure: SEGMENTAL FASCIECTOMY RIGHT RING FINGER;  Surgeon: Daryll Brod, MD;  Location: [place];  Service: Orthopedics;  Laterality: Right;  AXILLARY BLOCK   FLEXIBLE BRONCHOSCOPY Left 11/26/2019   Procedure: FLEXIBLE BRONCHOSCOPY WITH CRYO THERAPY;  Surgeon: Tyler Pita, MD;  Location: [facility];  Service: Cardiopulmonary;  Laterality: Left;   FLEXIBLE BRONCHOSCOPY Left 10/14/2021   Procedure: FLEXIBLE BRONCHOSCOPY;  Surgeon: Tyler Pita, MD;  Location: [facility];  Service: Pulmonary;  Laterality: Left;   IR ANGIOGRAM SELECTIVE EACH ADDITIONAL VESSEL  07/26/2017   IR ANGIOGRAM SELECTIVE EACH ADDITIONAL VESSEL  07/26/2017   IR ANGIOGRAM SELECTIVE EACH ADDITIONAL VESSEL   07/26/2017   IR ANGIOGRAM SELECTIVE EACH ADDITIONAL VESSEL  07/26/2017   IR ANGIOGRAM SELECTIVE EACH ADDITIONAL VESSEL  07/26/2017   IR ANGIOGRAM SELECTIVE EACH ADDITIONAL VESSEL  08/11/2017   IR ANGIOGRAM SELECTIVE EACH ADDITIONAL VESSEL  04/28/2018   IR ANGIOGRAM VISCERAL SELECTIVE  07/26/2017   IR ANGIOGRAM VISCERAL SELECTIVE  08/11/2017   IR ANGIOGRAM VISCERAL SELECTIVE  04/28/2018   IR EMBO ARTERIAL NOT HEMORR HEMANG INC GUIDE ROADMAPPING  07/26/2017   IR EMBO TUMOR ORGAN ISCHEMIA INFARCT INC GUIDE ROADMAPPING  08/11/2017   IR EMBO TUMOR ORGAN ISCHEMIA INFARCT INC GUIDE ROADMAPPING  04/28/2018   IR RADIOLOGIST EVAL & MGMT  07/07/2017   IR RADIOLOGIST EVAL & MGMT  09/07/2017   IR RADIOLOGIST EVAL & MGMT  12/21/2017   IR RADIOLOGIST EVAL & MGMT  03/22/2018   IR RADIOLOGIST EVAL & MGMT  06/08/2018   IR RADIOLOGIST EVAL & MGMT  11/01/2018   IR RADIOLOGIST EVAL & MGMT  03/21/2019   IR RADIOLOGIST EVAL & MGMT  11/08/2019   IR RADIOLOGIST EVAL & MGMT  07/01/2020   IR RADIOLOGIST EVAL & MGMT  12/18/2020   IR RADIOLOGIST EVAL & MGMT  06/18/2021   IR RADIOLOGIST EVAL & MGMT  12/14/2021   IR  RADIOLOGIST EVAL & MGMT  12/31/2021   IR US GUIDE VASC ACCESS RIGHT  07/26/2017   IR US GUIDE VASC ACCESS RIGHT  08/11/2017   IR US GUIDE VASC ACCESS RIGHT  04/28/2018   LUNG LOBECTOMY  06/10/13   upper left lung   MULTIPLE TOOTH EXTRACTIONS     REMOVAL RETAINED LENS Right 11/17/2015   Procedure: REMOVAL RETAINED LENS FROAGMENTS RIGHT EYE;  Surgeon: [name], MD;  Location: [place];  Service: Ophthalmology;  Laterality: Right;   TONSILLECTOMY     UPPER GASTROINTESTINAL ENDOSCOPY  11-03-15   Dr [name]   VASECTOMY     VIDEO ASSISTED THORACOSCOPY (VATS)/WEDGE RESECTION Left 06/04/2013   Procedure: VIDEO ASSISTED THORACOSCOPY (VATS)/WEDGE RESECTION;  Surgeon: [name], MD;  Location: [place];  Service: Thoracic;  Laterality: Left;   VIDEO BRONCHOSCOPY N/A 06/04/2013   Procedure: VIDEO BRONCHOSCOPY;   Surgeon: [name], MD;  Location: [place];  Service: Thoracic;  Laterality: N/A;   VIDEO BRONCHOSCOPY Left 08/12/2021   Procedure: VIDEO BRONCHOSCOPY WITHOUT FLUORO;  Surgeon: Tyler Pita, MD;  Location: [facility];  Service: Cardiopulmonary;  Laterality: Left;   VIDEO BRONCHOSCOPY Left 09/09/2021   Procedure: VIDEO BRONCHOSCOPY WITHOUT FLUORO;  Surgeon: Tyler Pita, MD;  Location: [facility];  Service: Cardiopulmonary;  Laterality: Left;    FAMILY HISTORY: Family History  Problem Relation Age of Onset   Stroke Mother    Alzheimer's disease Mother    Hypertension Sister    Hyperlipidemia Sister    Hypertension Brother    Hyperlipidemia Brother    Diabetes Brother     ADVANCED DIRECTIVES (Y/N):  N  HEALTH MAINTENANCE: Social History   Tobacco Use   Smoking status: Former    Packs/day: 1.00    Years: 40.00    Total pack years: 40.00    Types: Cigarettes    Quit date: 12/09/1998    Years since quitting: 23.0   Smokeless tobacco: Never  Vaping Use   Vaping Use: Never used  Substance Use Topics   Alcohol use: No   Drug use: No     Colonoscopy:  PAP:  Bone density:  Lipid panel:  Allergies  Allergen Reactions   Tape Itching    Surgical tapes     Current Outpatient Medications  Medication Sig Dispense Refill   aspirin EC 81 MG tablet Take 81 mg by mouth daily.     atorvastatin (LIPITOR) 40 MG tablet Take 1 tablet (40 mg total) by mouth daily. for cholesterol. 90 tablet 1   b complex vitamins capsule Take 1 capsule by mouth daily.     cholecalciferol (VITAMIN D3) 25 MCG (1000 UNIT) tablet Take 1,000 Units by mouth daily.     Cinnamon 500 MG capsule Take 500 mg by mouth at bedtime.     diphenhydrAMINE (BENADRYL) 25 mg capsule Take 25 mg by mouth every morning.     DM-APAP-CPM (CORICIDIN HBP PO) Take 2 tablets by mouth daily as needed (allergies/cold symptoms).     glucose blood (ONETOUCH VERIO) test strip USE 1 STRIP TO CHECK GLUCOSE ONCE DAILY 100 each  3   ipratropium (ATROVENT) 0.03 % nasal spray Place 2 sprays into both nostrils 3 (three) times daily. (Patient taking differently: Place 2 sprays into both nostrils daily as needed for rhinitis.) 30 mL 3   losartan (COZAAR) 100 MG tablet Take 1 tablet (100 mg total) by mouth every morning. 90 tablet 1   Multiple Vitamins-Minerals (MULTIVITAMIN WITH MINERALS) tablet Take  1 tablet by mouth daily.     polycarbophil (FIBERCON) 625 MG tablet Take 625 mg by mouth 2 (two) times daily.     polyvinyl alcohol (LIQUIFILM TEARS) 1.4 % ophthalmic solution Place 1 drop into both eyes as needed for dry eyes.     vitamin C (ASCORBIC ACID) 500 MG tablet Take 500 mg by mouth daily.     zinc sulfate 220 (50 Zn) MG capsule Take 1 capsule (220 mg total) by mouth daily. 30 capsule 0   amLODipine (NORVASC) 2.5 MG tablet Take 1 tablet (2.5 mg total) by mouth daily. (Patient not taking: Reported on 12/28/2021) 90 tablet 0   amLODipine (NORVASC) 5 MG tablet Take 1 tablet (5 mg total) by mouth daily. (Patient not taking: Reported on 12/28/2021) 90 tablet 0   No current facility-administered medications for this visit.   Facility-Administered Medications Ordered in Other Visits  Medication Dose Route Frequency Provider Last Rate Last Admin   lanreotide acetate (SOMATULINE DEPOT) injection 120 mg  120 mg Subcutaneous Once Lloyd Huger, MD        OBJECTIVE: Vitals:   01/05/22 1524  BP: (!) 154/69  Pulse: (!) 55  Temp: 98.6 F (37 C)     Body mass index is 27.36 kg/m.    ECOG FS:0 - Asymptomatic  General: Well-developed, well-nourished, no acute distress. Eyes: Pink conjunctiva, anicteric sclera. HEENT: Normocephalic, moist mucous membranes. Lungs: No audible wheezing or coughing. Heart: Regular rate and rhythm. Abdomen: Soft, nontender, no obvious distention. Musculoskeletal: No edema, cyanosis, or clubbing. Neuro: Alert, answering all questions appropriately. Cranial nerves grossly intact. Skin: No  rashes or petechiae noted. Psych: Normal affect.   LAB RESULTS:  Lab Results  Component Value Date   NA 139 12/29/2021   K 4.4 12/29/2021   CL 105 12/29/2021   CO2 28 12/29/2021   GLUCOSE 122 (H) 12/29/2021   BUN 19 12/29/2021   CREATININE 1.02 12/29/2021   CALCIUM 9.2 12/29/2021   PROT 6.9 12/29/2021   ALBUMIN 4.1 12/29/2021   AST 22 12/29/2021   ALT 25 12/29/2021   ALKPHOS 136 (H) 12/29/2021   BILITOT 0.9 12/29/2021   GFRNONAA >60 12/29/2021   GFRAA >60 01/29/2020    Lab Results  Component Value Date   WBC 10.6 (H) 12/29/2021   NEUTROABS 7.7 12/29/2021   HGB 15.7 12/29/2021   HCT 47.8 12/29/2021   MCV 91.6 12/29/2021   PLT 155 12/29/2021     STUDIES: IR Radiologist Eval & Mgmt  Result Date: 12/31/2021 Please refer to notes tab for details about interventional procedure. (Op Note)  NM PET (CU-64 DETECTNET)SKULL TO MID THIGH  Result Date: 12/28/2021 CLINICAL DATA:  Well differentiated neuroendocrine tumor with liver metastasis. Surveillance study. Post multiple yttrium 90 microsphere radio embolization treatments to hepatic metastatic lesions. EXAM: NUCLEAR MEDICINE PET SKULL BASE TO THIGH TECHNIQUE: 4.3 mCi copper 6 DOTATATE was injected intravenously. Full-ring PET imaging was performed from the skull base to thigh after the radiotracer. CT data was obtained and used for attenuation correction and anatomic localization. COMPARISON:  CT 11/19/2021, CT 11/02/2019, MRI 03/15/2019, DOTATATE PET scan 12/12/2017. FINDINGS: NECK No radiotracer activity in neck lymph nodes. Incidental CT findings: None CHEST No radiotracer accumulation within mediastinal or hilar lymph nodes. No suspicious pulmonary nodules on the CT scan. Incidental CT finding:None ABDOMEN/PELVIS Focus of radiotracer activity elevated above normal background liver within the medial margin of the lateral segment LEFT hepatic lobe with SUV max equal 9.1 (image 123) this corresponds to  exophytic nodule on  comparison CT (image 53/2) Second lesion in the posterior ref RIGHT hepatic lobe measuring approximately 1-2 cm (SUV max equal 7.0 on image 129) also corresponds to hypodense lesion on comparison CT. No radiotracer avid periportal nodes. No radiotracer avid implants within the mesentery. No pelvic adenopathy. No peritoneal deposits. Physiologic activity noted in the liver, spleen, adrenal glands and kidneys. Incidental CT findings:Intense activity within the prostate gland favored benign. SKELETON Focus of radiotracer activity in the proximal medullary space of the RIGHT humerus with SUV max equal 5.1 on image 36. There is soft tissue hazy density hazy is a soft tissue density lesion within the medullary space at this level. Second similar lesion in the midshaft RIGHT humerus with SUV max equal 3.8 on image 20. There is mild activity within the RIGHT iliac bone just above the acetabulum which may be degenerative (SUV max equal 4.2 on image 210. Focal activity in the RIGHT iliac bone adjacent the SI joint with very low activity (SUV max equal 3.0. Incidental CT findings:None IMPRESSION: 1. Two foci of radiotracer activity within the liver which correspond to lesions on CT 11/19/2021 are consistent with well differentiated neuroendocrine tumor. 2. No evidence of mesenteric, nodal, or peritoneal metastasis. 3. Two discrete lesions within the medullary space of the RIGHT humerus. Activity less than expected for well differentiated neuroendocrine tumor metastasis, however, neuroendocrine tumor of lung often has less activity than lesions GI origin NET. Findings concerning for metastasis. 4. Two lesions in the RIGHT iliac bone are indeterminate. Electronically Signed   By: Suzy Bouchard M.D.   On: 12/28/2021 14:49   IR Radiologist Eval & Mgmt  Result Date: 12/14/2021 Please refer to notes tab for details about interventional procedure. (Op Note)   ASSESSMENT: Stage IVA neuroendocrine tumor of the lung metastatic  to the liver.  PLAN:    1. 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 Patient underwent left upper lobe lobectomy in [place] on June 04, 2013. Final pathology results revealed a well differentiated neuroendocrine tumor with positive bronchial margins. Patient did not receive adjuvant XRT or chemotherapy at that time.  His most recent Y 90 ablation to his liver occurred on April 28, 2018.  Patient's chromogranin A levels have trended up and are now greater than 1200.  Previously, they ranged between 213 and 261.  CT scan results from December 28, 2021 reviewed independently and reported as above with 2 new foci noted in his liver likely consistent with progressive disease.  Agree with IR's recommendation of 4 infusions of Lutathera every 8 weeks.  Case discussed with pulmonology.  We will continue 120 mg subcutaneous lanreotide as scheduled every 4 weeks.   Return to clinic in 1 month as previously scheduled. 2.  Left mainstem endobronchial mass: CT scan results as above.  Consistent with neuroendocrine tumor.  Continue cryoablation as per pulmonology.  Lutathera as above.   3.  Covid: Resolved.  4.  Urinary retention: Resolved. Foley has been removed.    I spent a total of 30 minutes reviewing chart data, face-to-face evaluation with the patient, counseling and coordination of care as detailed above.    Patient expressed understanding and was in agreement with this plan. He also understands that He can call clinic at any time with any questions, concerns, or complaints.    Cancer Staging  Neuroendocrine carcinoma of lung ([place]) Staging form: Lung, AJCC 8th Edition - Clinical stage  from 12/28/2016: Stage IVA (cT2a, cN0, cM1b) - Signed by Lloyd Huger, MD on 12/28/2016 Sites of metastasis: Liver   Lloyd Huger, MD   01/08/2022 9:49 AM

## 2022-01-05 ENCOUNTER — Inpatient Hospital Stay: Payer: Medicare Other | Attending: Oncology | Admitting: Oncology

## 2022-01-05 ENCOUNTER — Encounter: Payer: Self-pay | Admitting: Oncology

## 2022-01-05 VITALS — BP 154/69 | HR 55 | Temp 98.6°F | Ht 64.0 in | Wt 159.4 lb

## 2022-01-05 DIAGNOSIS — Z79899 Other long term (current) drug therapy: Secondary | ICD-10-CM | POA: Insufficient documentation

## 2022-01-05 DIAGNOSIS — C7A8 Other malignant neuroendocrine tumors: Secondary | ICD-10-CM | POA: Diagnosis present

## 2022-01-05 DIAGNOSIS — C787 Secondary malignant neoplasm of liver and intrahepatic bile duct: Secondary | ICD-10-CM | POA: Diagnosis not present

## 2022-01-05 DIAGNOSIS — D3A8 Other benign neuroendocrine tumors: Secondary | ICD-10-CM | POA: Insufficient documentation

## 2022-01-08 ENCOUNTER — Encounter: Payer: Self-pay | Admitting: Oncology

## 2022-01-13 ENCOUNTER — Ambulatory Visit: Payer: Medicare Other | Admitting: Oncology

## 2022-01-13 ENCOUNTER — Encounter (HOSPITAL_COMMUNITY)
Admission: RE | Admit: 2022-01-13 | Discharge: 2022-01-13 | Disposition: A | Discharge status: Home / Self Care | Payer: Medicare Other | Source: Ambulatory Visit | Attending: Interventional Radiology | Admitting: Interventional Radiology

## 2022-01-13 ENCOUNTER — Telehealth: Payer: Self-pay | Admitting: Oncology

## 2022-01-13 DIAGNOSIS — K769 Liver disease, unspecified: Secondary | ICD-10-CM | POA: Insufficient documentation

## 2022-01-13 DIAGNOSIS — C7A8 Other malignant neuroendocrine tumors: Secondary | ICD-10-CM | POA: Insufficient documentation

## 2022-01-13 NOTE — Consult Note (Signed)
Chief Complaint: [Well differentiated neuroendocrine tumor with liver metastasis.  Concern for progression of disease.   Evaluation Peptide receptor radiotherapy (PRRT) with Lu177 DOTATATE (Lutathera).  Referring Physician(s): Shick     Patient Status: [facility] - Out-pt  History of Present Illness: Jason Malone is a 81 y.o. male with original diagnosis of bronchial carcinoid in June 2015.  Status post left upper lobectomy.    Metastatic carcinoid tumor identified within the liver with subsequent yttrium 90 radio embolization in April 2019 and December 2019.  Good control of liver metastasis over 3 year interval.    Most recent DOTATATE PET scan identified 2 small radiotracer avid lesions in the liver.  Lesions are small but have significant radiotracer activity consistent with well differentiated neuroendocrine tumor.  Two additional lesion identified in the medullary space of the RIGHT humerus concerning for metastatic well differentiated tumor.    Recent chromogranin A serum elevated to 1208 increased from 511 one  month prior..  Laboratory finding is also concerning for disease progression.   Patient on maintenance Lanreotide monthly injections (120 mg).   Past Medical History:  Diagnosis Date   Allergic rhinitis    Aortic atherosclerosis (HCC)    Benign prostatic hypertrophy with lower urinary tract symptoms (LUTS)    Bradycardia    Chest pain    a. 05/2013 MV: EF 70%, no ischemia.   Chronic respiratory failure with hypoxia (HCC)    COPD (chronic obstructive pulmonary disease) (HCC)    Coronary artery disease    COVID-19    Diabetes mellitus without complication ([place])    last a1c 6.5 09-16-21 no meds   Diverticulitis    DIVERTICULOSIS   Dysrhythmia    Elevated LFTs    Enlarged prostate    Essential hypertension    CONTROLLED ON MEDS   Full dentures    H/O hypokalemia    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 08/28/2016   Hyperlipemia     Hypomagnesemia    IFG (impaired fasting glucose)    Liver cancer ([place]) 12/08/2016   Liver mass, left lobe 08/28/2016   Probably hemangioma; will get MRI of liver, refer to GI   Lung cancer ([place])    a. carcinoid, left lung, Stage 1b (T2a, N0, cM0);  b. 05/2013 s/p VATS & LULobectomy.   Metabolic acidosis    Monocytosis    Osteopenia    Paroxysmal atrial flutter (HCC)    a. 03/2013->no recurrence;  b. CHA2DS2VASc = 2-->not currently on anticoagulation;  c. 05/2012 Echo: EF 55-60%, normal RV.   Pneumonia    Postinflammatory pulmonary fibrosis (HCC)    Rectal bleeding    Smoking history    Wears glasses     Past Surgical History:  Procedure Laterality Date   BRONCHOSCOPY     04/06/2013   CARDIOVASCULAR STRESS TEST  05/2013   a. No evidence of ischemia or infarct, EF 70%, no WMAs   CATARACT EXTRACTION W/PHACO Left 07/14/2015   Procedure: CATARACT EXTRACTION PHACO AND INTRAOCULAR LENS PLACEMENT (IOC);  Surgeon: [name], MD;  Location: [place];  Service: Ophthalmology;  Laterality: Left;   CATARACT EXTRACTION W/PHACO Right 10/01/2015   Procedure: CATARACT EXTRACTION PHACO AND INTRAOCULAR LENS PLACEMENT (IOC);  Surgeon: [name], MD;  Location: [place];  Service: Ophthalmology;  Laterality: Right;   COLONOSCOPY W/ POLYPECTOMY     bleed after-had to go to surgery to stop bleeding via colonoscopy   COLONOSCOPY WITH PROPOFOL N/A 11/19/2015  Procedure: COLONOSCOPY WITH PROPOFOL;  Surgeon: [name], MD;  Location: [facility] ENDOSCOPY;  Service: Endoscopy;  Laterality: N/A;   DUPUYTREN CONTRACTURE RELEASE  12/15/2011   Procedure: DUPUYTREN CONTRACTURE RELEASE;  Surgeon: [name], MD;  Location: [place];  Service: Orthopedics;  Laterality: Left;  Fasciotomy left ring finger dupuytrens   ELECTROMAGNETIC NAVIGATION BROCHOSCOPY Left 09/19/2019   Procedure: ELECTROMAGNETIC NAVIGATION BRONCHOSCOPY;  Surgeon: Tyler Pita, MD;   Location: [facility];  Service: Cardiopulmonary;  Laterality: Left;   FASCIECTOMY Right 11/07/2018   Procedure: SEGMENTAL FASCIECTOMY RIGHT RING FINGER;  Surgeon: Daryll Brod, MD;  Location: [place];  Service: Orthopedics;  Laterality: Right;  AXILLARY BLOCK   FLEXIBLE BRONCHOSCOPY Left 11/26/2019   Procedure: FLEXIBLE BRONCHOSCOPY WITH CRYO THERAPY;  Surgeon: Tyler Pita, MD;  Location: [facility];  Service: Cardiopulmonary;  Laterality: Left;   FLEXIBLE BRONCHOSCOPY Left 10/14/2021   Procedure: FLEXIBLE BRONCHOSCOPY;  Surgeon: Tyler Pita, MD;  Location: [facility];  Service: Pulmonary;  Laterality: Left;   IR ANGIOGRAM SELECTIVE EACH ADDITIONAL VESSEL  07/26/2017   IR ANGIOGRAM SELECTIVE EACH ADDITIONAL VESSEL  07/26/2017   IR ANGIOGRAM SELECTIVE EACH ADDITIONAL VESSEL  07/26/2017   IR ANGIOGRAM SELECTIVE EACH ADDITIONAL VESSEL  07/26/2017   IR ANGIOGRAM SELECTIVE EACH ADDITIONAL VESSEL  07/26/2017   IR ANGIOGRAM SELECTIVE EACH ADDITIONAL VESSEL  08/11/2017   IR ANGIOGRAM SELECTIVE EACH ADDITIONAL VESSEL  04/28/2018   IR ANGIOGRAM VISCERAL SELECTIVE  07/26/2017   IR ANGIOGRAM VISCERAL SELECTIVE  08/11/2017   IR ANGIOGRAM VISCERAL SELECTIVE  04/28/2018   IR EMBO ARTERIAL NOT HEMORR HEMANG INC GUIDE ROADMAPPING  07/26/2017   IR EMBO TUMOR ORGAN ISCHEMIA INFARCT INC GUIDE ROADMAPPING  08/11/2017   IR EMBO TUMOR ORGAN ISCHEMIA INFARCT INC GUIDE ROADMAPPING  04/28/2018   IR RADIOLOGIST EVAL & MGMT  07/07/2017   IR RADIOLOGIST EVAL & MGMT  09/07/2017   IR RADIOLOGIST EVAL & MGMT  12/21/2017   IR RADIOLOGIST EVAL & MGMT  03/22/2018   IR RADIOLOGIST EVAL & MGMT  06/08/2018   IR RADIOLOGIST EVAL & MGMT  11/01/2018   IR RADIOLOGIST EVAL & MGMT  03/21/2019   IR RADIOLOGIST EVAL & MGMT  11/08/2019   IR RADIOLOGIST EVAL & MGMT  07/01/2020   IR RADIOLOGIST EVAL & MGMT  12/18/2020   IR RADIOLOGIST EVAL & MGMT  06/18/2021   IR RADIOLOGIST EVAL & MGMT  12/14/2021   IR RADIOLOGIST EVAL & MGMT  12/31/2021    IR US GUIDE VASC ACCESS RIGHT  07/26/2017   IR US GUIDE VASC ACCESS RIGHT  08/11/2017   IR US GUIDE VASC ACCESS RIGHT  04/28/2018   LUNG LOBECTOMY  06/10/13   upper left lung   MULTIPLE TOOTH EXTRACTIONS     REMOVAL RETAINED LENS Right 11/17/2015   Procedure: REMOVAL RETAINED LENS FROAGMENTS RIGHT EYE;  Surgeon: [name], MD;  Location: [place];  Service: Ophthalmology;  Laterality: Right;   TONSILLECTOMY     UPPER GASTROINTESTINAL ENDOSCOPY  11-03-15   Dr [name]   VASECTOMY     VIDEO ASSISTED THORACOSCOPY (VATS)/WEDGE RESECTION Left 06/04/2013   Procedure: VIDEO ASSISTED THORACOSCOPY (VATS)/WEDGE RESECTION;  Surgeon: [name], MD;  Location: [place];  Service: Thoracic;  Laterality: Left;   VIDEO BRONCHOSCOPY N/A 06/04/2013   Procedure: VIDEO BRONCHOSCOPY;  Surgeon: [name], MD;  Location: [place];  Service: Thoracic;  Laterality: N/A;   VIDEO BRONCHOSCOPY Left 08/12/2021   Procedure: VIDEO BRONCHOSCOPY  WITHOUT FLUORO;  Surgeon: Tyler Pita, MD;  Location: [facility];  Service: Cardiopulmonary;  Laterality: Left;   VIDEO BRONCHOSCOPY Left 09/09/2021   Procedure: VIDEO BRONCHOSCOPY WITHOUT FLUORO;  Surgeon: Tyler Pita, MD;  Location: [facility];  Service: Cardiopulmonary;  Laterality: Left;    Allergies: Tape  Medications: Prior to Admission medications   Medication Sig Start Date End Date Taking? Authorizing Provider  amLODipine (NORVASC) 2.5 MG tablet Take 1 tablet (2.5 mg total) by mouth daily. Patient not taking: Reported on 12/28/2021 09/16/21   Teodora Medici, DO  amLODipine (NORVASC) 5 MG tablet Take 1 tablet (5 mg total) by mouth daily. Patient not taking: Reported on 12/28/2021 09/16/21   Teodora Medici, DO  aspirin EC 81 MG tablet Take 81 mg by mouth daily.    [provider]  atorvastatin (LIPITOR) 40 MG tablet Take 1 tablet (40 mg total) by mouth daily. for cholesterol. 12/28/21   Teodora Medici, DO  b complex  vitamins capsule Take 1 capsule by mouth daily.    [provider]  cholecalciferol (VITAMIN D3) 25 MCG (1000 UNIT) tablet Take 1,000 Units by mouth daily.    [provider]  Cinnamon 500 MG capsule Take 500 mg by mouth at bedtime.    [provider]  diphenhydrAMINE (BENADRYL) 25 mg capsule Take 25 mg by mouth every morning.    [provider]  DM-APAP-CPM (CORICIDIN HBP PO) Take 2 tablets by mouth daily as needed (allergies/cold symptoms).    [provider]  glucose blood (ONETOUCH VERIO) test strip USE 1 STRIP TO CHECK GLUCOSE ONCE DAILY 08/07/21   Teodora Medici, DO  ipratropium (ATROVENT) 0.03 % nasal spray Place 2 sprays into both nostrils 3 (three) times daily. Patient taking differently: Place 2 sprays into both nostrils daily as needed for rhinitis. 04/09/21   Tyler Pita, MD  losartan (COZAAR) 100 MG tablet Take 1 tablet (100 mg total) by mouth every morning. 12/28/21   Teodora Medici, DO  Multiple Vitamins-Minerals (MULTIVITAMIN WITH MINERALS) tablet Take 1 tablet by mouth daily.    [provider]  polycarbophil (FIBERCON) 625 MG tablet Take 625 mg by mouth 2 (two) times daily.    [provider]  polyvinyl alcohol (LIQUIFILM TEARS) 1.4 % ophthalmic solution Place 1 drop into both eyes as needed for dry eyes.    [provider]  vitamin C (ASCORBIC ACID) 500 MG tablet Take 500 mg by mouth daily.    [provider]  zinc sulfate 220 (50 Zn) MG capsule Take 1 capsule (220 mg total) by mouth daily. 06/18/19   Allie Bossier, MD     Family History  Problem Relation Age of Onset   Stroke Mother    Alzheimer's disease Mother    Hypertension Sister    Hyperlipidemia Sister    Hypertension Brother    Hyperlipidemia Brother    Diabetes Brother     Social History   Socioeconomic History   Marital status: Married    Spouse name: [name]   Number of children: 3   Years of education: Not on  file   Highest education level: High school graduate  Occupational History   Not on file  Tobacco Use   Smoking status: Former    Packs/day: 1.00    Years: 40.00    Total pack years: 40.00    Types: Cigarettes    Quit date: 12/09/1998    Years since quitting: 23.1   Smokeless tobacco: Never  Vaping Use   Vaping Use: Never used  Substance and Sexual Activity   Alcohol use: No   Drug use: No   Sexual activity: Yes    Partners: Female  Other Topics Concern   Not on file  Social History Narrative   Not on file   Social Determinants of Health   Financial Resource Strain: Low Risk  (06/12/2018)   Overall Financial Resource Strain (CARDIA)    Difficulty of Paying Living Expenses: Not hard at all  Food Insecurity: No Food Insecurity (06/12/2018)   Hunger Vital Sign    Worried About Running Out of Food in the Last Year: Never true    [place] in the Last Year: Never true  Transportation Needs: No Transportation Needs (06/12/2018)   PRAPARE - Hydrologist (Medical): No    Lack of Transportation (Non-Medical): No  Physical Activity: Sufficiently Active (06/12/2018)   Exercise Vital Sign    Days of Exercise per Week: 7 days    Minutes of Exercise per Session: 60 min  Stress: No Stress Concern Present (06/12/2018)   [place]    Feeling of Stress : Not at all  Social Connections: Somewhat Isolated (06/12/2018)   Social Connection and Isolation Panel [NHANES]    Frequency of Communication with Friends and Family: More than three times a week    Frequency of Social Gatherings with Friends and Family: More than three times a week    Attends Religious Services: Never    Marine scientist or Organizations: No    Attends Archivist Meetings: Never    Marital Status: Married    ECOG Status: 0 - Asymptomatic  Review of Systems: A 12 point ROS discussed and pertinent positives  are indicated in the HPI above.  All other systems are negative.  Review of Systems  Constitutional:  Negative for fever and unexpected weight change.  Gastrointestinal:  Negative for diarrhea.  Skin: Negative.        No flushing  Hematological:  Bruises/bleeds easily.  Psychiatric/Behavioral: Negative.      Vital Signs: There were no vitals taken for this visit.  Physical Exam Defferred for covid safety  Imaging: No results found.  Labs:  CBC: Recent Labs    10/06/21 0900 11/03/21 0909 12/01/21 1033 12/29/21 1059  WBC 11.6* 10.9* 9.6 10.6*  HGB 15.2 14.9 15.5 15.7  HCT 45.6 45.1 46.4 47.8  PLT 196 194 167 155    COAGS: No results for input(s): "INR", "APTT" in the last 8760 hours.  BMP: Recent Labs    10/06/21 0900 11/03/21 0909 12/01/21 1033 12/29/21 1059  NA 140 137 137 139  K 4.5 4.4 4.6 4.4  CL 106 104 106 105  CO2 _0 GLUCOSE 151* 144* 98 122*  BUN 21 24* 20 19  CALCIUM 9.3 9.2 9.3 9.2  CREATININE 1.05 1.05 0.95 1.02  GFRNONAA >60 >60 >60 >60    LIVER FUNCTION TESTS: Recent Labs    10/06/21 0900 11/03/21 0909 12/01/21 1033 12/29/21 1059  BILITOT 0.4 0.8 0.9 0.9  AST _1 ALT _2 ALKPHOS 129* 138* 128* 136*  PROT 6.9 7.0 7.0 6.9  ALBUMIN 3.9 4.0 4.1 4.1    TUMOR MARKERS: Recent Labs    10/06/21 0900 11/03/21 0909 12/01/21 1033 12/29/21 1059  CHROMOGA 876.8* 947.4* 551.2* 1,208.0*    Assessment and  Plan:  Alto [place] is a candidate for peptide receptor radiotherapy.  Patient has well differentiated neuroendocrine tumor identified within the RIGHT lliver and right humerus.  The tumor is positive for DOTATATE/ somatostatin receptors.  Patient has no  symptoms attributable to carcinoid tumor.  Evidence of disease progression on DOTATATE PET scan 12/28/21.  Increasing serum chromogranin A.   The patient was counseled on the primary goal of therapy which is prolongation of progression free survival (79%  improvement over standard therapy).  Secondary goals would include decrease in tumor burden and decrease in carcinoid symptoms.    Primary of toxicities of therapy were explained to patient including marrow suppression, renal toxicity and hepatic toxicity.  Rare toxicity of myelosuppression and leukemia also explained.  Potential toxicity will be monitored throughout the course of therapy with interval  CBC and CMP laboratory evaluation.    Four therapies will be scheduled  2 months apart over a  six-month interval.  Patient will receive IM Sandostatin injection in the molecular imaging department after each therapy.  Patient will return to oncology clinic 1 month following each therapy for CBC and CMP and IM Sandostatin injection.   Thank you for this interesting consult.  I greatly enjoyed meeting Riaz W Hoban and look forward to participating in their care.  A copy of this report was sent to the requesting provider on this date.  Electronically Signed: Rennis Golden, MD 01/13/2022, 1:18 PM   I spent a total of  30 Minutes   in face to face in clinical consultation, greater than 50% of which was counseling/coordinating care for metastatic neuroendocrine tumor.

## 2022-01-13 NOTE — Telephone Encounter (Signed)
Per patient Dr. Renetta Chalk said to cancel appointment and Dr. Grayland Ormond will get back to him on scheduling next appointment.

## 2022-01-14 ENCOUNTER — Encounter: Payer: Self-pay | Admitting: Pulmonary Disease

## 2022-01-14 ENCOUNTER — Ambulatory Visit (INDEPENDENT_AMBULATORY_CARE_PROVIDER_SITE_OTHER): Payer: Medicare Other | Admitting: Pulmonary Disease

## 2022-01-14 VITALS — BP 136/72 | HR 67 | Temp 98.3°F | Ht 64.0 in | Wt 158.8 lb

## 2022-01-14 DIAGNOSIS — J8410 Pulmonary fibrosis, unspecified: Secondary | ICD-10-CM

## 2022-01-14 DIAGNOSIS — Z8616 Personal history of COVID-19: Secondary | ICD-10-CM | POA: Diagnosis not present

## 2022-01-14 DIAGNOSIS — C7A8 Other malignant neuroendocrine tumors: Secondary | ICD-10-CM

## 2022-01-14 NOTE — Telephone Encounter (Signed)
Added to list to follow up later.

## 2022-01-14 NOTE — Progress Notes (Unsigned)
Subjective:    Patient ID: Jason Malone, male    DOB: [birth year], 81 y.o.   MRN: [id number] Patient Care Team: Teodora Medici, DO as PCP - General (Internal Medicine) Minna Merritts, MD as PCP - Cardiology (Cardiology) [name], Forest Gleason, MD (General Surgery) Nestor Lewandowsky, MD (Inactive) as Referring Physician (Cardiothoracic Surgery) Minna Merritts, MD as Consulting Physician (Cardiology) [name], MD (Inactive) as Consulting Physician (Cardiothoracic Surgery) Lloyd Huger, MD as Consulting Physician (Oncology) Arnetha Courser, MD as Attending Physician [facility])  Chief Complaint  Patient presents with   Follow-up    No current sx.    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  The patient underwent resection for carcinoid tumor in 2015 however he had recurrence noted in August 2018 when liver involvement was noted.  In January 2021 he had a severe case of COVID-19 and was admitted due to respiratory failure for the same.  A CT scan performed 11 May 2019 showed typical parenchymal involvement of COVID in the right lung, the left lung had a lobulated mass in the left mainstem bronchus and appeared to be arising from the suture line of the prior resection.  He underwent cryoablation and tumor debulking in 3 separate occasions on 19 Sep 2019, 10 October 2019 and on 26 November 2019.  Mass was debulked and significant reduced in size.  There was minimal residual noted on the last session with cryotherapy performed at that time.  Biopsies confirmed neuroendocrine carcinoma.  He did well until August 2022 when there was noted that there may be some recurrence at the suture line.  However at that time it was not obstructing the bronchus and the patient had not noticed any significant shortness of breath over his baseline.  At that time he also wanted  to postpone procedures.  On 12 June 2021 he had a CT chest with contrast that showed that the nodule or soft tissue in the anterior aspect of the left mainstem bronchus had continued to increase.  He was scheduled for bronchoscopy on 20 February however he had another bout with COVID and this had to be postponed.  He subsequently underwent repeat bronchoscopy on 5 April that showed that he had complete obstruction of his left mainstem bronchus he underwent cryoablation and tumor debulking.  He underwent a second session on 09 Sep 2021 and this time he had only minimal residual noted from the prior procedure.  His third and final session on that cycle was on 14 October 2021.  There was only minimal residual tissue noted.  He underwent cryoablation of this area.  He tolerated that procedure well and has done well after that.  He had a follow-up from that procedure on 17 November 2021 and at that time was doing well.  He had a chest abdomen and pelvis CT on Thursday 13 July.  This showed that the previously noted soft tissue nodule in the anterior aspect of the left mainstem bronchus was no longer seen following the ablation.  However there were new hepatic lesions and multiple sclerotic osseous metastatic lesions including sternal body T1 T11 L2 and L3.  The patient underwent a DOTATATE PET CT on 14 December 2021 showing no evidence of activity on the previously ablated bronchial tissue.  There was evidence of hepatic metastasis as well as bony metastasis.  His  chromogranin a levels have been trending up and were noted to be greater than 1200.  After this the patient has been referred for [place] treatment and is undergoing this with our interventional radiology department.  He is to have 4 infusions of Lutathera every 8 weeks and will continue lanreotide as scheduled every 4 weeks.   He has been doing well, he does not endorse any fevers, chills or sweats.  Shortness of breath that was noted prior to his April  bronchoscopy has now resolved and he remains symptom-free in this regard he does still occasionally get short of breath only on heavy exertion.  He has rare occasional cough that is dry and nonproductive.  No hemoptysis.  No sputum production.  No weight loss or anorexia.  He does have back pain which is a new symptom for him.  He has known sclerotic metastatic lesions to the spine.  Otherwise he feels well and looks well.   Review of Systems A 10 point review of systems was performed and it is as noted above otherwise negative.  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   Respiratory failure ([place]) 06/11/2019   Elevated LFTs 06/11/2019   Pneumonia due to COVID-19 virus 06/10/2019   Contracture of palmar fascia 10/11/2018   Microalbuminuria due to type 2 diabetes mellitus ([place]) 04/12/2018   Type 2 diabetes mellitus ([place]) 04/10/2018   Osteopenia determined by x-ray 01/07/2017   Neuroendocrine carcinoma of lung ([place]) 12/28/2016   Low back pain 12/17/2016   Liver mass, left lobe 08/28/2016   Aortic atherosclerosis ([place]) 04/06/2016   Coronary artery disease involving native heart without angina pectoris 04/06/2016   History of colonic polyps 10/28/2015   Medication monitoring encounter 08/15/2015   Paroxysmal atrial flutter (HCC)    Elevated serum alkaline phosphatase level 03/05/2015   Allergic rhinitis    BPH without urinary obstruction    Hyperlipidemia 02/13/2014   Orthostasis 07/18/2013    Sinus bradycardia 07/18/2013   S/P lobectomy of lung 06/04/2013   Atrial flutter ([place]) 04/09/2013   Smoking history 04/09/2013   Essential hypertension 04/09/2013   Rectal bleeding 12/15/2012   Social History   Tobacco Use   Smoking status: Former    Packs/day: 1.00    Years: 40.00    Total pack years: 40.00    Types: Cigarettes    Quit date: 12/09/1998    Years since quitting: 23.1   Smokeless tobacco: Never  Substance Use Topics   Alcohol use: No   Allergies  Allergen Reactions   Tape Itching    Surgical tapes    Current Meds  Medication Sig   aspirin EC 81 MG tablet Take 81 mg by mouth daily.   atorvastatin (LIPITOR) 40 MG tablet Take 1 tablet (40 mg total) by mouth daily. for cholesterol.   b complex vitamins capsule Take 1 capsule by mouth daily.   cholecalciferol (VITAMIN D3) 25 MCG (1000 UNIT) tablet Take 1,000 Units by mouth daily.   Cinnamon 500 MG capsule Take 500 mg by mouth at bedtime.  diphenhydrAMINE (BENADRYL) 25 mg capsule Take 25 mg by mouth every morning.   DM-APAP-CPM (CORICIDIN HBP PO) Take 2 tablets by mouth daily as needed (allergies/cold symptoms).   glucose blood (ONETOUCH VERIO) test strip USE 1 STRIP TO CHECK GLUCOSE ONCE DAILY   ipratropium (ATROVENT) 0.03 % nasal spray Place 2 sprays into both nostrils 3 (three) times daily. (Patient taking differently: Place 2 sprays into both nostrils daily as needed for rhinitis.)   losartan (COZAAR) 100 MG tablet Take 1 tablet (100 mg total) by mouth every morning.   Multiple Vitamins-Minerals (MULTIVITAMIN WITH MINERALS) tablet Take 1 tablet by mouth daily.   polycarbophil (FIBERCON) 625 MG tablet Take 625 mg by mouth 2 (two) times daily.   polyvinyl alcohol (LIQUIFILM TEARS) 1.4 % ophthalmic solution Place 1 drop into both eyes as needed for dry eyes.   vitamin C (ASCORBIC ACID) 500 MG tablet Take 500 mg by mouth daily.   zinc sulfate 220 (50 Zn) MG capsule Take 1 capsule (220 mg total) by mouth daily.    [DISCONTINUED] amLODipine (NORVASC) 2.5 MG tablet Take 1 tablet (2.5 mg total) by mouth daily.   [DISCONTINUED] amLODipine (NORVASC) 5 MG tablet Take 1 tablet (5 mg total) by mouth daily.   Immunization History  Administered Date(s) Administered   Fluad Quad(high Dose 65+) 01/24/2019, 01/23/2020, 01/30/2021   Influenza, High Dose Seasonal PF 02/12/2016, 02/14/2017, 01/17/2018   Influenza,inj,Quad PF,6+ Mos 02/14/2015   PFIZER(Purple Top)SARS-COV-2 Vaccination 05/24/2019, 10/02/2019, 04/14/2020   PNEUMOCOCCAL CONJUGATE-20 12/28/2021   Pneumococcal Conjugate-13 02/01/2014   Pneumococcal Polysaccharide-23 05/10/2008       Objective:   Physical Exam BP 136/72 (BP Location: Left Arm, Cuff Size: Normal)   Pulse 67   Temp 98.3 F (36.8 C) (Temporal)   Ht 5\' 4"  (1.626 m)   Wt 158 lb 12.8 oz (72 kg)   SpO2 95%   BMI 27.26 kg/m  GENERAL: Awake and alert, well developed, well nourished gentleman, in no respiratory distress, fully ambulatory, no conversational dyspnea. HEAD: Normocephalic, atraumatic. EYES: Pupils equal, round, reactive to light.  No scleral icterus. MOUTH: Wears dentures.  No thrush. NECK: Supple. No thyromegaly. No nodules. No JVD.  Trachea is midline PULMONARY:  Symmetrical and good air entry, slightly diminished breath sounds in the left upper lung zone, no wheezes or rhonchi noted. CARDIOVASCULAR: S1 and S2.    Regular rate and rhythm.  No rubs murmurs or gallops appreciated. GASTROINTESTINAL: Benign. MUSCULOSKELETAL: No joint deformity, no clubbing, no edema. NEUROLOGIC: No focal deficit, no gait disturbance, speech is fluent. SKIN: Intact,warm,dry. No rashes noted on limited exam. PSYCH: Mood and behavior normal.    Assessment & Plan:     ICD-10-CM   1. Neuroendocrine carcinoma of lung (HCC) -stage IVa  C7A.8    Status post cryoablation on the left mainstem bronchus On Lutathera treatment Continues on lanreotide    2. Postinflammatory pulmonary fibrosis  (HCC)  J84.10    No evidence of progression Asymptomatic in this regard Post COVID-19    3. Personal history of COVID-19  Z86.16    No sequela after recent reinfection     From our standpoint patient is doing well.  We will see him in follow-up in 4 months time he is to call sooner should any new difficulties arise.   Renold Don, MD Advanced Bronchoscopy PCCM [facility]    *This note was dictated using voice recognition software/Dragon.  Despite best efforts to proofread, errors can occur which can change the meaning. Any  transcriptional errors that result from this process are unintentional and may not be fully corrected at the time of dictation.

## 2022-01-14 NOTE — Patient Instructions (Signed)
We will see you in follow-up in 4 months time call sooner if any new difficulties arise.

## 2022-01-19 ENCOUNTER — Other Ambulatory Visit (HOSPITAL_COMMUNITY): Payer: Self-pay | Admitting: Interventional Radiology

## 2022-01-19 DIAGNOSIS — C7A8 Other malignant neuroendocrine tumors: Secondary | ICD-10-CM

## 2022-01-20 ENCOUNTER — Encounter: Payer: Self-pay | Admitting: Pulmonary Disease

## 2022-01-21 ENCOUNTER — Encounter: Payer: Self-pay | Admitting: Oncology

## 2022-01-28 NOTE — Written Directive (Addendum)
MOLECULAR IMAGING AND THERAPEUTICS WRITTEN DIRECTIVE   PATIENT NAME: Jason Malone  PT DOB:   1941-05-03                                              MRN: [id number]  ---------------------------------------------------------------------------------------------------------------------  [facility] THERAP[facility]   RADIOPHARMACEUTICAL:  Lutetium 177 Dotatate (Lutathera)     PRESCRIBED DOSE FOR ADMINISTRATION:  200 mCi   ROUTE OFADMINISTRATION:  IV   DIAGNOSIS:  Neuroendocrine carcinoma   REFERRING PHYSICIAN: Dr. Greggory Keen   TREATMENT #: 1   DATE OF LAST LONG LIVED SOMATOSTATIN INJECTION: 12/29/21   ADDITIONAL PHYSICIAN COMMENTS/NOTES:    AUTHORIZED USER SIGNATURE & TIME STAMP: Rennis Golden, MD   02/01/22    8:29 AM

## 2022-02-01 ENCOUNTER — Ambulatory Visit (INDEPENDENT_AMBULATORY_CARE_PROVIDER_SITE_OTHER): Payer: Medicare Other

## 2022-02-01 DIAGNOSIS — Z23 Encounter for immunization: Secondary | ICD-10-CM

## 2022-02-03 ENCOUNTER — Ambulatory Visit: Payer: Medicare Other

## 2022-02-03 ENCOUNTER — Ambulatory Visit: Payer: Medicare Other | Admitting: Oncology

## 2022-02-03 ENCOUNTER — Other Ambulatory Visit: Payer: Medicare Other

## 2022-02-17 ENCOUNTER — Ambulatory Visit: Payer: Medicare Other | Admitting: Pulmonary Disease

## 2022-02-17 ENCOUNTER — Encounter (HOSPITAL_COMMUNITY)
Admission: RE | Admit: 2022-02-17 | Discharge: 2022-02-17 | Disposition: A | Discharge status: Home / Self Care | Payer: Medicare Other | Source: Ambulatory Visit | Attending: Interventional Radiology | Admitting: Interventional Radiology

## 2022-02-17 VITALS — BP 136/78 | HR 59 | Temp 98.3°F | Resp 18

## 2022-02-17 DIAGNOSIS — C7A00 Malignant carcinoid tumor of unspecified site: Secondary | ICD-10-CM | POA: Insufficient documentation

## 2022-02-17 DIAGNOSIS — J449 Chronic obstructive pulmonary disease, unspecified: Secondary | ICD-10-CM | POA: Diagnosis present

## 2022-02-17 DIAGNOSIS — C7A8 Other malignant neuroendocrine tumors: Secondary | ICD-10-CM | POA: Insufficient documentation

## 2022-02-17 LAB — CBC WITH DIFFERENTIAL/PLATELET
Abs Immature Granulocytes: 0.04 10*3/uL (ref 0.00–0.07)
Basophils Absolute: 0.1 10*3/uL (ref 0.0–0.1)
Basophils Relative: 1 %
Eosinophils Absolute: 0.1 10*3/uL (ref 0.0–0.5)
Eosinophils Relative: 1 %
HCT: 47.8 % (ref 39.0–52.0)
Hemoglobin: 15.4 g/dL (ref 13.0–17.0)
Immature Granulocytes: 0 %
Lymphocytes Relative: 6 %
Lymphs Abs: 0.8 10*3/uL (ref 0.7–4.0)
MCH: 29.8 pg (ref 26.0–34.0)
MCHC: 32.2 g/dL (ref 30.0–36.0)
MCV: 92.5 fL (ref 80.0–100.0)
Monocytes Absolute: 1.9 10*3/uL — ABNORMAL HIGH (ref 0.1–1.0)
Monocytes Relative: 15 %
Neutro Abs: 9.4 10*3/uL — ABNORMAL HIGH (ref 1.7–7.7)
Neutrophils Relative %: 77 %
Platelets: 170 10*3/uL (ref 150–400)
RBC: 5.17 MIL/uL (ref 4.22–5.81)
RDW: 14.4 % (ref 11.5–15.5)
WBC: 12.3 10*3/uL — ABNORMAL HIGH (ref 4.0–10.5)
nRBC: 0 % (ref 0.0–0.2)

## 2022-02-17 LAB — COMPREHENSIVE METABOLIC PANEL
ALT: 31 U/L (ref 0–44)
AST: 31 U/L (ref 15–41)
Albumin: 4.1 g/dL (ref 3.5–5.0)
Alkaline Phosphatase: 148 U/L — ABNORMAL HIGH (ref 38–126)
Anion gap: 7 (ref 5–15)
BUN: 20 mg/dL (ref 8–23)
CO2: 25 mmol/L (ref 22–32)
Calcium: 9.7 mg/dL (ref 8.9–10.3)
Chloride: 107 mmol/L (ref 98–111)
Creatinine, Ser: 0.94 mg/dL (ref 0.61–1.24)
GFR, Estimated: >60 mL/min (ref >60)
Glucose, Bld: 157 mg/dL — ABNORMAL HIGH (ref 70–99)
Potassium: 4 mmol/L (ref 3.5–5.1)
Sodium: 139 mmol/L (ref 135–145)
Total Bilirubin: 0.7 mg/dL (ref 0.3–1.2)
Total Protein: 7.3 g/dL (ref 6.5–8.1)

## 2022-02-17 MED ORDER — LANREOTIDE ACETATE 120 MG/0.5ML ~~LOC~~ SOLN: 120.0000 mg | Freq: Once | SUBCUTANEOUS | Status: AC

## 2022-02-17 MED ORDER — SODIUM CHLORIDE 0.9 % IV SOLN
8.0000 mg | Freq: Once | INTRAVENOUS | Status: AC
  Administered 2022-02-17: 8 mg via INTRAVENOUS
  Filled 2022-02-17: qty 4

## 2022-02-17 MED ORDER — LUTETIUM LU 177 DOTATATE 370 MBQ/ML IV SOLN
200.0000 | Freq: Once | INTRAVENOUS | Status: AC
  Administered 2022-02-17: 204.5 via INTRAVENOUS

## 2022-02-17 MED ORDER — OCTREOTIDE ACETATE 500 MCG/ML IJ SOLN
500.0000 ug | Freq: Once | INTRAMUSCULAR | Status: DC | PRN
Start: 2022-02-17 — End: 2022-02-23

## 2022-02-17 MED ORDER — PROCHLORPERAZINE EDISYLATE 10 MG/2ML IJ SOLN: 10.0000 mg | Freq: Four times a day (QID) | INTRAMUSCULAR | Status: DC | PRN

## 2022-02-17 MED ORDER — LANREOTIDE ACETATE 120 MG/0.5ML ~~LOC~~ SOLN
SUBCUTANEOUS | Status: AC
  Administered 2022-02-17: 120 mg via SUBCUTANEOUS
  Filled 2022-02-17: qty 120

## 2022-02-17 MED ORDER — AMINO ACID RADIOPROTECTANT - L-LYSINE 2.5%/L-ARGININE 2.5% IN NS
250.0000 mL/h | INTRAVENOUS | Status: AC
  Administered 2022-02-17: 250 mL/h via INTRAVENOUS
  Filled 2022-02-17: qty 1000

## 2022-02-17 MED ORDER — SODIUM CHLORIDE 0.9 % IV SOLN
500.0000 mL | Freq: Once | INTRAVENOUS | Status: AC
  Administered 2022-02-17: 500 mL via INTRAVENOUS

## 2022-02-17 MED ORDER — ONDANSETRON HCL 8 MG PO TABS: 8.0000 mg | ORAL_TABLET | Freq: Two times a day (BID) | ORAL | 0 refills | Status: DC | PRN

## 2022-02-17 NOTE — Progress Notes (Signed)
[  [age] year-old [male] with metastatic neuroendocrine tumor. Well differentiated tumor with somatostatin receptor is identified within the [liver and RIGHT humerus] by DOTATATE PET CT scan. EXAM: NUCLEAR MEDICINE LUTATHERA ADMINISTRATION TECHNIQUE: Infusion: The nuclear medicine technologist and I personally verified the dose activity ([192] mCi) to be delivered as specified in the written directive (200 mCi), and verified the patient identification via 2 separate methods.  20 gauge IV were started in the antecubital veins. Anti-emetics were administered by nursing staff. Amino acid renal protection was initiated 30 minutes prior to Lu 177 DOTATATE (Lutathera) infusion and continued continuously for 4 hours. Lutathera infusion was administered over 30 minutes.     The total administered dose was [204.5] mCi Lu 177 DOTATATE.   The entire IV tubing, venocatheter, stopcock and syringes was removed in total, placed in a disposal bag and sent for assay of the residual activity, which will be reported at a later time in our EMR by the physics staff. Pressure was applied to the venipuncture sites, and a compression bandage placed. Radiation Safety personnel were present to perform the discharge survey, as detailed on their documentation.   Patient received 30 mg IM long-acting Sandostatin injection 4 hours after Lutathera effusion in the nuclear medicine department.  RADIOPHARMACEUTICALS:   [204.] mCi Lu 177 DOTATATE  FINDINGS: Diagnosis: [Metastatic neuroendocrine tumor.]   Current Infusion: [1]   Planned Infusions: [4]   Patient reports [minimal] interval symptoms following therapy. The patient's most recent blood counts were reviewed and remains a good candidate to proceed with Lutathera. The patient was situated in an infusion suite and administered Lutathera as above. Patient will follow-up with referring oncologist for interval serum laboratories (CBC and CMP) in approximately 4 weeks.       Patient received 120 mg IM long-acting Lanreotide injection 4 hours after Lutathera effusion in the nuclear medicine department.    IMPRESSION: [First] KG 254 [id number] treatment for metastatic neuroendocrine tumor. The patient tolerated the infusion well. The patient will return in 8 weeks for ongoing care.

## 2022-03-10 ENCOUNTER — Telehealth: Payer: Self-pay

## 2022-03-17 ENCOUNTER — Inpatient Hospital Stay: Payer: Medicare Other | Attending: Oncology

## 2022-03-17 DIAGNOSIS — Z79899 Other long term (current) drug therapy: Secondary | ICD-10-CM | POA: Diagnosis not present

## 2022-03-17 DIAGNOSIS — C7A8 Other malignant neuroendocrine tumors: Secondary | ICD-10-CM | POA: Diagnosis present

## 2022-03-17 MED ORDER — LANREOTIDE ACETATE 120 MG/0.5ML ~~LOC~~ SOLN
120.0000 mg | Freq: Once | SUBCUTANEOUS | Status: AC
  Administered 2022-03-17: 120 mg via SUBCUTANEOUS
  Filled 2022-03-17: qty 120

## 2022-04-08 NOTE — Written Directive (Addendum)
MOLECULAR IMAGING AND THERAPEUTICS WRITTEN DIRECTIVE   PATIENT NAME: Jason Malone  PT DOB:   06/15/1940                                              MRN: [id number]  ---------------------------------------------------------------------------------------------------------------------  [facility] THERAPY   RADIOPHARMACEUTICAL:  Lutetium 177 Dotatate (Lutathera)     PRESCRIBED DOSE FOR ADMINISTRATION:  200 mCi   ROUTE OFADMINISTRATION:  IV   DIAGNOSIS:  Neuroendocrine carcinoma   REFERRING PHYSICIAN: Dr. Greggory Keen   TREATMENT #:  2   DATE OF LAST LONG LIVED SOMATOSTATIN INJECTION: 02/17/22   ADDITIONAL PHYSICIAN COMMENTS/NOTES:   AUTHORIZED USER SIGNATURE & TIME STAMP: Rennis Golden, MD   04/12/22    11:07 AM

## 2022-04-11 NOTE — Progress Notes (Unsigned)
Cardiology Office Note  Date:  04/12/2022   ID:  Jason Malone, DOB [birth year], MRN [id number]  PCP:  Jason Medici, DO   Chief Complaint  Patient presents with   12 month follow up    "Doing well." Medications reviewed by the patient verbally.     HPI:  Jason Malone is a 81 year-old gentleman with long history of  smoking,  stopped 2001 lung cancer on the left, status post resection January 2015,  hypertension,  hyperlipidemia  hospital 03/29/2013 with tachycardia, shortness of breath, atrial flutter, treated with rate controlling medications, and converted to NSR on arrival to the hospital with potassium 3.1, low magnesium felt secondary to his HCTZ. This was stopped. 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 liver prior radioactive seeds , liver mets from lung infusions of Lutathera every 2 months  Reports he is active, in general feels well Denies any tachypalpitations concerning for arrhythmia No chest pain concerning for angina Uses the treadmill twice a day, 15 min at a time  Carvedilol previously held for orthostasis , bradycardia  Lab work reviewed A1c 6.5 Total chol 115, LDL 54  COVID pneumonia 07/2019  EKG personally reviewed by myself on todays visit Normal sinus rhythm rate 60 bpm no significant ST-T wave changes  Other past medical history reviewed Echo 12/10/2019 with LVEF 55-60%, mild LVH, grade 2 diastolic dysfunction, RV normal size and function, LA mild to moderately dilated, mild MR.  zio monitor NSR with minimum HR 40, avg 63, and max 164 bpm. He had 27 runs of SVT with longest 15 beats with rate of 98 bpm and fastest 10 beats with rate 164 bpm. he had raree PVC <1% and rare PACs 1.3%. He had rare ventricular bigeminy and trigeminy. No  significant pauses. There were no patient triggered events   emergency room January 27, 2018 Atypical chest pain  Previous studies CT chest  Mild aortic atherosclerosis, arch, descending aorta, Minimal CAD, calcification aortic valve (?left main)  Echocardiogram 03/29/2013 shows atrial flutter, ejection fraction 55-60%, otherwise normal study  CT scan of the chest shows 4.6 x 4.1 x 4.2 cm left hilar mass encasing the left pulmonary artery branches Recent lab work showing total cholesterol 171, HDL 39, LDL 86 prior  stress test 05/22/2013 showing no ischemia    PMH:   has a past medical history of Allergic rhinitis, Aortic atherosclerosis ([place]), Benign prostatic hypertrophy with lower urinary tract symptoms (LUTS), Bradycardia, Chest pain, Chronic respiratory failure with hypoxia (HCC), COPD (chronic obstructive pulmonary disease) ([place]), Coronary artery disease, COVID-19, Diabetes mellitus without complication ([place]), Diverticulitis, Dysrhythmia, Elevated LFTs, Enlarged prostate, Essential hypertension, Full dentures, H/O hypokalemia, malignant carcinoid tumor of bronchus and lung (08/28/2016), Hyperlipemia, Hypomagnesemia, IFG (impaired fasting glucose), Liver cancer ([place]) (12/08/2016), Liver mass, left lobe (08/28/2016), Lung cancer ([place]), Metabolic acidosis, Monocytosis, Osteopenia, Paroxysmal atrial flutter ([place]), Pneumonia, Postinflammatory pulmonary fibrosis ([place]), Rectal bleeding, Smoking history, and Wears glasses.  PSH:    Past Surgical History:  Procedure Laterality Date   BRONCHOSCOPY     04/06/2013   CARDIOVASCULAR STRESS TEST  05/2013   a. No evidence of ischemia or infarct, EF 70%, no WMAs   CATARACT EXTRACTION W/PHACO Left 07/14/2015   Procedure: CATARACT EXTRACTION PHACO AND INTRAOCULAR LENS PLACEMENT (IOC);  Surgeon: [name],  MD;  Location: [place];  Service: Ophthalmology;  Laterality: Left;   CATARACT EXTRACTION W/PHACO Right 10/01/2015   Procedure:  CATARACT EXTRACTION PHACO AND INTRAOCULAR LENS PLACEMENT (IOC);  Surgeon: [name], MD;  Location: [place];  Service: Ophthalmology;  Laterality: Right;   COLONOSCOPY W/ POLYPECTOMY     bleed after-had to go to surgery to stop bleeding via colonoscopy   COLONOSCOPY WITH PROPOFOL N/A 11/19/2015   Procedure: COLONOSCOPY WITH PROPOFOL;  Surgeon: [name], MD;  Location[facility]COPY;  Service: Endoscopy;  Laterality: N/A;   DUPUYTREN CONTRACTURE RELEASE  12/15/2011   Procedure: DUPUYTREN CONTRACTURE RELEASE;  Surgeon: [name], MD;  Location: [place];  Service: Orthopedics;  Laterality: Left;  Fasciotomy left ring finger dupuytrens   ELECTROMAGNETIC NAVIGATION BROCHOSCOPY Left 09/19/2019   Procedure: ELECTROMAGNETIC NAVIGATION BRONCHOSCOPY;  Surgeon: Tyler Pita, MD;  Location: [facility];  Service: Cardiopulmonary;  Laterality: Left;   FASCIECTOMY Right 11/07/2018   Procedure: SEGMENTAL FASCIECTOMY RIGHT RING FINGER;  Surgeon: Daryll Brod, MD;  Location: [place];  Service: Orthopedics;  Laterality: Right;  AXILLARY BLOCK   FLEXIBLE BRONCHOSCOPY Left 11/26/2019   Procedure: FLEXIBLE BRONCHOSCOPY WITH CRYO THERAPY;  Surgeon: Tyler Pita, MD;  Location: [facility];  Service: Cardiopulmonary;  Laterality: Left;   FLEXIBLE BRONCHOSCOPY Left 10/14/2021   Procedure: FLEXIBLE BRONCHOSCOPY;  Surgeon: Tyler Pita, MD;  Location: [facility];  Service: Pulmonary;  Laterality: Left;   IR ANGIOGRAM SELECTIVE EACH ADDITIONAL VESSEL  07/26/2017   IR ANGIOGRAM SELECTIVE EACH ADDITIONAL VESSEL  07/26/2017   IR ANGIOGRAM SELECTIVE EACH ADDITIONAL VESSEL  07/26/2017   IR ANGIOGRAM SELECTIVE EACH ADDITIONAL VESSEL  07/26/2017   IR ANGIOGRAM SELECTIVE EACH ADDITIONAL VESSEL  07/26/2017   IR ANGIOGRAM SELECTIVE EACH ADDITIONAL VESSEL  08/11/2017   IR ANGIOGRAM SELECTIVE EACH ADDITIONAL VESSEL  04/28/2018   IR ANGIOGRAM VISCERAL SELECTIVE   07/26/2017   IR ANGIOGRAM VISCERAL SELECTIVE  08/11/2017   IR ANGIOGRAM VISCERAL SELECTIVE  04/28/2018   IR EMBO ARTERIAL NOT HEMORR HEMANG INC GUIDE ROADMAPPING  07/26/2017   IR EMBO TUMOR ORGAN ISCHEMIA INFARCT INC GUIDE ROADMAPPING  08/11/2017   IR EMBO TUMOR ORGAN ISCHEMIA INFARCT INC GUIDE ROADMAPPING  04/28/2018   IR RADIOLOGIST EVAL & MGMT  07/07/2017   IR RADIOLOGIST EVAL & MGMT  09/07/2017   IR RADIOLOGIST EVAL & MGMT  12/21/2017   IR RADIOLOGIST EVAL & MGMT  03/22/2018   IR RADIOLOGIST EVAL & MGMT  06/08/2018   IR RADIOLOGIST EVAL & MGMT  11/01/2018   IR RADIOLOGIST EVAL & MGMT  03/21/2019   IR RADIOLOGIST EVAL & MGMT  11/08/2019   IR RADIOLOGIST EVAL & MGMT  07/01/2020   IR RADIOLOGIST EVAL & MGMT  12/18/2020   IR RADIOLOGIST EVAL & MGMT  06/18/2021   IR RADIOLOGIST EVAL & MGMT  12/14/2021   IR RADIOLOGIST EVAL & MGMT  12/31/2021   IR US GUIDE VASC ACCESS RIGHT  07/26/2017   IR US GUIDE VASC ACCESS RIGHT  08/11/2017   IR US GUIDE VASC ACCESS RIGHT  04/28/2018   LUNG LOBECTOMY  06/10/13   upper left lung   MULTIPLE TOOTH EXTRACTIONS     REMOVAL RETAINED LENS Right 11/17/2015   Procedure: REMOVAL RETAINED LENS FROAGMENTS RIGHT EYE;  Surgeon: [name], MD;  Location: [place];  Service: Ophthalmology;  Laterality: Right;   TONSILLECTOMY     UPPER GASTROINTESTINAL ENDOSCOPY  11-03-15   Dr [name]  VASECTOMY     VIDEO ASSISTED THORACOSCOPY (VATS)/WEDGE RESECTION Left 06/04/2013   Procedure: VIDEO ASSISTED THORACOSCOPY (VATS)/WEDGE RESECTION;  Surgeon: [name], MD;  Location: [place];  Service: Thoracic;  Laterality: Left;   VIDEO BRONCHOSCOPY N/A 06/04/2013   Procedure: VIDEO BRONCHOSCOPY;  Surgeon: [name], MD;  Location[facility];  Service: Thoracic;  Laterality: N/A;   VIDEO BRONCHOSCOPY Left 08/12/2021   Procedure: VIDEO BRONCHOSCOPY WITHOUT FLUORO;  Surgeon: Tyler Pita, MD;  Location: [facility];  Service: Cardiopulmonary;  Laterality: Left;   VIDEO  BRONCHOSCOPY Left 09/09/2021   Procedure: VIDEO BRONCHOSCOPY WITHOUT FLUORO;  Surgeon: Tyler Pita, MD;  Location: [facility];  Service: Cardiopulmonary;  Laterality: Left;    Current Outpatient Medications  Medication Sig Dispense Refill   aspirin EC 81 MG tablet Take 81 mg by mouth daily.     atorvastatin (LIPITOR) 40 MG tablet Take 1 tablet (40 mg total) by mouth daily. for cholesterol. 90 tablet 1   b complex vitamins capsule Take 1 capsule by mouth daily.     cholecalciferol (VITAMIN D3) 25 MCG (1000 UNIT) tablet Take 1,000 Units by mouth daily.     Cinnamon 500 MG capsule Take 500 mg by mouth at bedtime.     diphenhydrAMINE (BENADRYL) 25 mg capsule Take 25 mg by mouth every morning.     DM-APAP-CPM (CORICIDIN HBP PO) Take 2 tablets by mouth daily as needed (allergies/cold symptoms).     glucose blood (ONETOUCH VERIO) test strip USE 1 STRIP TO CHECK GLUCOSE ONCE DAILY 100 each 3   ipratropium (ATROVENT) 0.03 % nasal spray Place 2 sprays into both nostrils 3 (three) times daily. (Patient taking differently: Place 2 sprays into both nostrils daily as needed for rhinitis.) 30 mL 3   losartan (COZAAR) 100 MG tablet Take 1 tablet (100 mg total) by mouth every morning. 90 tablet 1   Multiple Vitamins-Minerals (MULTIVITAMIN WITH MINERALS) tablet Take 1 tablet by mouth daily.     ondansetron (ZOFRAN) 8 MG tablet Take 1 tablet (8 mg total) by mouth 2 (two) times daily as needed for nausea or vomiting. 20 tablet 0   polycarbophil (FIBERCON) 625 MG tablet Take 625 mg by mouth 2 (two) times daily.     polyvinyl alcohol (LIQUIFILM TEARS) 1.4 % ophthalmic solution Place 1 drop into both eyes as needed for dry eyes.     vitamin C (ASCORBIC ACID) 500 MG tablet Take 500 mg by mouth daily.     zinc sulfate 220 (50 Zn) MG capsule Take 1 capsule (220 mg total) by mouth daily. 30 capsule 0   No current facility-administered medications for this visit.   Facility-Administered Medications Ordered in Other  Visits  Medication Dose Route Frequency Provider Last Rate Last Admin   lanreotide acetate (SOMATULINE DEPOT) injection 120 mg  120 mg Subcutaneous Once Grayland Ormond, Kathlene November, MD        Allergies:   Tape   Social History:  The patient  reports that he quit smoking about 23 years ago. His smoking use included cigarettes. He has a 40.00 pack-year smoking history. He has never used smokeless tobacco. He reports that he does not drink alcohol and does not use drugs.   Family History:   family history includes Alzheimer's disease in his mother; Diabetes in his brother; Hyperlipidemia in his brother and sister; Hypertension in his brother and sister; Stroke in his mother.    Review of Systems: Review of Systems  Constitutional: Negative.  HENT: Negative.    Respiratory: Negative.    Cardiovascular: Negative.   Gastrointestinal: Negative.   Musculoskeletal: Negative.   Neurological:  Positive for dizziness.  Psychiatric/Behavioral: Negative.    All other systems reviewed and are negative.  PHYSICAL EXAM: VS:  BP 132/70 (BP Location: Left Arm, Patient Position: Sitting, Cuff Size: Normal)   Pulse 60   Ht _0  (1.626 m)   Wt 155 lb 2 oz (70.4 kg)   SpO2 97%   BMI 26.63 kg/m  , BMI Body mass index is 26.63 kg/m. Constitutional:  oriented to person, place, and time. No distress.  HENT:  Head: Grossly normal Eyes:  no discharge. No scleral icterus.  Neck: No JVD, no carotid bruits  Cardiovascular: Regular rate and rhythm, no murmurs appreciated Pulmonary/Chest: Clear to auscultation bilaterally, no wheezes or rails Abdominal: Soft.  no distension.  no tenderness.  Musculoskeletal: Normal range of motion Neurological:  normal muscle tone. Coordination normal. No atrophy Skin: Skin warm and dry Psychiatric: normal affect, pleasant  Recent Labs: 02/17/2022: ALT 31; BUN 20; Creatinine, Ser 0.94; Hemoglobin 15.4; Platelets 170; Potassium 4.0; Sodium 139    Lipid Panel Lab Results   Component Value Date   CHOL 115 12/28/2021   HDL 41 12/28/2021   LDLCALC 54 12/28/2021   TRIG 113 12/28/2021      Wt Readings from Last 3 Encounters:  04/12/22 155 lb 2 oz (70.4 kg)  01/14/22 158 lb 12.8 oz (72 kg)  01/05/22 159 lb 6.4 oz (72.3 kg)     ASSESSMENT AND PLAN:  Chest pain Currently with no symptoms of angina. No further workup at this time. Continue current medication regimen.  Essential hypertension - Plan: EKG 12-Lead Blood pressure is well controlled on today's visit. No changes made to the medications.  Paroxysmal atrial flutter (HCC) - Plan: EKG 12-Lead Maintaining normal sinus rhythm Carvedilol previously held for orthostasis and bradycardia  Mixed hyperlipidemia Cholesterol is at goal on the current lipid regimen. No changes to the medications were made.  Smoking history Stopped 2001  Lung cancer, S/P lobectomy of lung Metastasis to liver Followed by finnegan On immunotherapy  Coronary artery disease involving native heart without angina pectoris, unspecified vessel or lesion type CT scan heart chest minimal coronary calcifications noted Exercises on a regular basis, denies anginal symptoms Non-smoker currently, lipids at goal  Aortic atherosclerosis (HCC) Mild diffuse aortic atherosclerosis noted Lipids at goal   Total encounter time more than 30 minutes  Greater than 50% was spent in counseling and coordination of care with the patient    No orders of the defined types were placed in this encounter.    Signed, Esmond Plants, M.D., Ph.D. 04/12/2022  [place], [place]

## 2022-04-12 ENCOUNTER — Ambulatory Visit: Payer: Medicare Other | Attending: Cardiovascular Disease | Admitting: Cardiovascular Disease

## 2022-04-12 ENCOUNTER — Encounter: Payer: Self-pay | Admitting: Cardiovascular Disease

## 2022-04-12 VITALS — BP 132/70 | HR 60 | Ht 64.0 in | Wt 155.1 lb

## 2022-04-12 DIAGNOSIS — E782 Mixed hyperlipidemia: Secondary | ICD-10-CM

## 2022-04-12 DIAGNOSIS — I4892 Unspecified atrial flutter: Secondary | ICD-10-CM

## 2022-04-12 DIAGNOSIS — I2510 Atherosclerotic heart disease of native coronary artery without angina pectoris: Secondary | ICD-10-CM | POA: Diagnosis not present

## 2022-04-12 DIAGNOSIS — I951 Orthostatic hypotension: Secondary | ICD-10-CM | POA: Diagnosis not present

## 2022-04-12 DIAGNOSIS — Z87891 Personal history of nicotine dependence: Secondary | ICD-10-CM

## 2022-04-12 DIAGNOSIS — R001 Bradycardia, unspecified: Secondary | ICD-10-CM

## 2022-04-12 DIAGNOSIS — I700 Atherosclerosis of aorta: Secondary | ICD-10-CM | POA: Diagnosis not present

## 2022-04-12 NOTE — Patient Instructions (Signed)
Medication Instructions:  No changes  If you need a refill on your cardiac medications before your next appointment, please call your pharmacy.   Lab work: No new labs needed  Testing/Procedures: No new testing needed  Follow-Up: At [facility], you and your health needs are our priority.  As part of our continuing mission to provide you with exceptional heart care, we have created designated Provider Care Teams.  These Care Teams include your primary Cardiologist (physician) and Advanced Practice Providers (APPs -  Physician Assistants and Nurse Practitioners) who all work together to provide you with the care you need, when you need it.  You will need a follow up appointment in 12 months  Providers on your designated Care Team:   Christopher Berge, NP Ryan Dunn, PA-C Cadence Furth, PA-C  COVID-19 Vaccine Information can be found at: [email or web address] For questions related to vaccine distribution or appointments, please email [email or web address] or call [phone].   

## 2022-04-14 ENCOUNTER — Encounter (HOSPITAL_COMMUNITY)
Admission: RE | Admit: 2022-04-14 | Discharge: 2022-04-14 | Disposition: A | Discharge status: Home / Self Care | Payer: Medicare Other | Source: Ambulatory Visit | Attending: Interventional Radiology | Admitting: Interventional Radiology

## 2022-04-14 VITALS — BP 152/64 | HR 56 | Resp 18

## 2022-04-14 DIAGNOSIS — C7A8 Other malignant neuroendocrine tumors: Secondary | ICD-10-CM | POA: Insufficient documentation

## 2022-04-14 LAB — CBC WITH DIFFERENTIAL/PLATELET
Abs Immature Granulocytes: 0.05 10*3/uL (ref 0.00–0.07)
Basophils Absolute: 0.1 10*3/uL (ref 0.0–0.1)
Basophils Relative: 1 %
Eosinophils Absolute: 0.1 10*3/uL (ref 0.0–0.5)
Eosinophils Relative: 1 %
HCT: 48.3 % (ref 39.0–52.0)
Hemoglobin: 15.8 g/dL (ref 13.0–17.0)
Immature Granulocytes: 1 %
Lymphocytes Relative: 6 %
Lymphs Abs: 0.6 10*3/uL — ABNORMAL LOW (ref 0.7–4.0)
MCH: 30 pg (ref 26.0–34.0)
MCHC: 32.7 g/dL (ref 30.0–36.0)
MCV: 91.8 fL (ref 80.0–100.0)
Monocytes Absolute: 1.3 10*3/uL — ABNORMAL HIGH (ref 0.1–1.0)
Monocytes Relative: 12 %
Neutro Abs: 8.5 10*3/uL — ABNORMAL HIGH (ref 1.7–7.7)
Neutrophils Relative %: 79 %
Platelets: 188 10*3/uL (ref 150–400)
RBC: 5.26 MIL/uL (ref 4.22–5.81)
RDW: 14.3 % (ref 11.5–15.5)
WBC: 10.6 10*3/uL — ABNORMAL HIGH (ref 4.0–10.5)
nRBC: 0 % (ref 0.0–0.2)

## 2022-04-14 LAB — COMPREHENSIVE METABOLIC PANEL
ALT: 18 U/L (ref 0–44)
AST: 20 U/L (ref 15–41)
Albumin: 3.9 g/dL (ref 3.5–5.0)
Alkaline Phosphatase: 134 U/L — ABNORMAL HIGH (ref 38–126)
Anion gap: 10 (ref 5–15)
BUN: 18 mg/dL (ref 8–23)
CO2: 27 mmol/L (ref 22–32)
Calcium: 9.7 mg/dL (ref 8.9–10.3)
Chloride: 104 mmol/L (ref 98–111)
Creatinine, Ser: 0.97 mg/dL (ref 0.61–1.24)
GFR, Estimated: >60 mL/min (ref >60)
Glucose, Bld: 168 mg/dL — ABNORMAL HIGH (ref 70–99)
Potassium: 3.8 mmol/L (ref 3.5–5.1)
Sodium: 141 mmol/L (ref 135–145)
Total Bilirubin: 1 mg/dL (ref 0.3–1.2)
Total Protein: 7.2 g/dL (ref 6.5–8.1)

## 2022-04-14 MED ORDER — OCTREOTIDE ACETATE 30 MG IM KIT
PACK | INTRAMUSCULAR | Status: AC
  Filled 2022-04-14: qty 1

## 2022-04-14 MED ORDER — OCTREOTIDE ACETATE 500 MCG/ML IJ SOLN: 500.0000 ug | Freq: Once | INTRAMUSCULAR | Status: DC | PRN

## 2022-04-14 MED ORDER — LANREOTIDE ACETATE 120 MG/0.5ML ~~LOC~~ SOLN
SUBCUTANEOUS | Status: AC
  Administered 2022-04-14: 120 mg via SUBCUTANEOUS
  Filled 2022-04-14: qty 120

## 2022-04-14 MED ORDER — PROCHLORPERAZINE EDISYLATE 10 MG/2ML IJ SOLN: 10.0000 mg | Freq: Four times a day (QID) | INTRAMUSCULAR | Status: DC | PRN

## 2022-04-14 MED ORDER — SODIUM CHLORIDE 0.9 % IV SOLN
8.0000 mg | Freq: Once | INTRAVENOUS | Status: AC
  Administered 2022-04-14: 8 mg via INTRAVENOUS
  Filled 2022-04-14: qty 4

## 2022-04-14 MED ORDER — OCTREOTIDE ACETATE 30 MG IM KIT: 30.0000 mg | PACK | Freq: Once | INTRAMUSCULAR | Status: DC

## 2022-04-14 MED ORDER — LUTETIUM LU 177 DOTATATE 370 MBQ/ML IV SOLN
200.0000 | Freq: Once | INTRAVENOUS | Status: AC
  Administered 2022-04-14: 205.3 via INTRAVENOUS

## 2022-04-14 MED ORDER — SODIUM CHLORIDE 0.9 % IV SOLN
500.0000 mL | Freq: Once | INTRAVENOUS | Status: AC
  Administered 2022-04-14: 500 mL via INTRAVENOUS

## 2022-04-14 MED ORDER — LANREOTIDE ACETATE 120 MG/0.5ML ~~LOC~~ SOLN
120.0000 mg | Freq: Once | SUBCUTANEOUS | Status: AC
  Administered 2022-04-14: 120 mg via SUBCUTANEOUS

## 2022-04-14 MED ORDER — AMINO ACID RADIOPROTECTANT - L-LYSINE 2.5%/L-ARGININE 2.5% IN NS
250.0000 mL/h | INTRAVENOUS | Status: AC
  Administered 2022-04-14: 250 mL/h via INTRAVENOUS
  Filled 2022-04-14: qty 1000

## 2022-04-14 MED ORDER — ONDANSETRON HCL 8 MG PO TABS: 8.0000 mg | ORAL_TABLET | Freq: Two times a day (BID) | ORAL | 0 refills | Status: DC | PRN

## 2022-04-14 NOTE — Progress Notes (Signed)
CLINICAL DATA: [age] year-old [male] with metastatic neuroendocrine tumor. Well differentiated tumor with somatostatin receptor is identified within the [liver and RIGHT humerus] by DOTATATE PET CT scan.  EXAM: NUCLEAR MEDICINE LUTATHERA ADMINISTRATION  TECHNIQUE: Infusion: The nuclear medicine technologist and I personally verified the dose activity ([192] mCi) to be delivered as specified in the written directive (200 mCi), and verified the patient identification via 2 separate methods.  20 gauge IV were started in the antecubital veins. Anti-emetics were administered by nursing staff. Amino acid renal protection was initiated 30 minutes prior to Lu 177 DOTATATE (Lutathera) infusion and continued continuously for 4 hours. Lutathera infusion was administered over 30 minutes.      The total administered dose was [205.3] mCi Lu 177 DOTATATE.    The entire IV tubing, venocatheter, stopcock and syringes was removed in total, placed in a disposal bag and sent for assay of the residual activity, which will be reported at a later time in our EMR by the physics staff. Pressure was applied to the venipuncture sites, and a compression bandage placed. Radiation Safety personnel were present to perform the discharge survey, as detailed on their documentation.    Patient received 30 mg IM long-acting Sandostatin injection 4 hours after Lutathera effusion in the nuclear medicine department.   RADIOPHARMACEUTICALS:   [205.3] mCi Lu 177 DOTATATE   FINDINGS: Diagnosis: [Metastatic neuroendocrine tumor.]    Current Infusion: [2]    Planned Infusions: [4]    Patient reports [minimal] interval symptoms following therapy.  Patient experienced 1 episode of nausea proximally 4 days after therapy which relieved by Zofran.  Mild fatigue.     The patient's most recent blood counts were reviewed and remains a good candidate to proceed with Lutathera.  No evidence of myelosuppression or  renal insufficiency.  Normal bilirubin.  The patient was situated in an infusion suite and administered Lutathera as above.     Patient will follow-up with referring oncologist for interval serum laboratories (CBC and CMP) in approximately 4 weeks.       Patient received 30 mg IM long-acting Sandostatin injection 4 hours after Lutathera effusion in the nuclear medicine department.     IMPRESSION: [Second] VG [id number] [id number] treatment for metastatic neuroendocrine tumor. The patient tolerated the infusion well. The patient will return in 8 weeks for ongoing care.    Patient will follow-up with referring oncologist for interval serum laboratories (CBC and CMP) in approximately 4 weeks.

## 2022-04-14 NOTE — Progress Notes (Signed)
Treatment is complete. Pt tolerated it well and has no s/s of distress. VS and orders assessed and pt denies additional complaints. Will continue to monitor and tx pt according to MD orders.

## 2022-04-14 NOTE — Progress Notes (Signed)
Amino Acids complete. VS and orders assessed and pt tolerated it well. Pt has no s/s of distress. Pt denies additional complaints and will continue to monitor and tx pt according to MD orders.

## 2022-05-04 ENCOUNTER — Other Ambulatory Visit: Payer: Self-pay | Admitting: Pulmonary Disease

## 2022-05-12 ENCOUNTER — Inpatient Hospital Stay: Payer: Medicare Other | Attending: Oncology

## 2022-05-12 DIAGNOSIS — C7A8 Other malignant neuroendocrine tumors: Secondary | ICD-10-CM

## 2022-05-12 DIAGNOSIS — Z79899 Other long term (current) drug therapy: Secondary | ICD-10-CM | POA: Diagnosis not present

## 2022-05-12 MED ORDER — LANREOTIDE ACETATE 120 MG/0.5ML ~~LOC~~ SOLN
120.0000 mg | Freq: Once | SUBCUTANEOUS | Status: AC
  Administered 2022-05-12: 120 mg via SUBCUTANEOUS
  Filled 2022-05-12: qty 120

## 2022-05-17 ENCOUNTER — Encounter: Payer: Self-pay | Admitting: Oncology

## 2022-05-17 NOTE — Telephone Encounter (Signed)
Error

## 2022-06-08 ENCOUNTER — Encounter: Payer: Self-pay | Admitting: Pulmonary Disease

## 2022-06-08 ENCOUNTER — Ambulatory Visit: Payer: Medicare Other | Admitting: Pulmonary Disease

## 2022-06-08 VITALS — BP 124/80 | HR 56 | Temp 97.8°F | Ht 64.0 in | Wt 157.8 lb

## 2022-06-08 DIAGNOSIS — J8410 Pulmonary fibrosis, unspecified: Secondary | ICD-10-CM | POA: Diagnosis not present

## 2022-06-08 DIAGNOSIS — C7A8 Other malignant neuroendocrine tumors: Secondary | ICD-10-CM | POA: Diagnosis not present

## 2022-06-08 DIAGNOSIS — Z8616 Personal history of COVID-19: Secondary | ICD-10-CM | POA: Diagnosis not present

## 2022-06-08 NOTE — Patient Instructions (Signed)
We will see him in follow-up towards the end of March 1 part of April.  If you developed any worsening problems with shortness of breath or cough that does not go away let us know.

## 2022-06-08 NOTE — Progress Notes (Signed)
Subjective:    Patient ID: Jason Malone, male    DOB: [birth year], 82 y.o.   MRN: [id number] Patient Care Team: Teodora Medici, DO as PCP - General (Internal Medicine) Minna Merritts, MD as PCP - Cardiology (Cardiology) [name], Forest Gleason, MD (General Surgery) Nestor Lewandowsky, MD (Inactive) as Referring Physician (Cardiothoracic Surgery) Minna Merritts, MD as Consulting Physician (Cardiology) [name], MD (Inactive) as Consulting Physician (Cardiothoracic Surgery) Lloyd Huger, MD as Consulting Physician (Oncology) Arnetha Courser, MD as Attending Physician [facility])  Chief Complaint  Patient presents with   Follow-up    SOB with exertion. No wheezing. Dry cough.    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  This is a scheduled visit.  We last saw him on 14 January 2022.  Recall that the patient underwent resection for carcinoid tumor in 2015 however, he had recurrence noted in August 2018 when liver involvement was noted.  In January 2021 he had a severe case of COVID-19 and was admitted due to respiratory failure for the same.  A CT scan performed 11 May 2019 showed typical parenchymal involvement of COVID in the right lung, the left lung had a lobulated mass in the left mainstem bronchus and appeared to be arising from the suture line of the prior resection.  He underwent cryoablation and tumor debulking in 3 separate occasions on 19 Sep 2019, 10 October 2019 and on 26 November 2019.  Mass was debulked and significant reduced in size.  There was minimal residual noted on the last session with cryotherapy performed at that time.  Biopsies confirmed neuroendocrine carcinoma.  He did well until August 2022 when there was noted that there may be some recurrence at the suture line.  However at that time it was not obstructing the bronchus and the  patient had not noticed any significant shortness of breath over his baseline.  At that time he also wanted to postpone procedures.  On 12 June 2021 he had a CT chest with contrast that showed that the nodule or soft tissue in the anterior aspect of the left mainstem bronchus had continued to increase.  He was scheduled for bronchoscopy on 20 February however he had another bout with COVID and this had to be postponed.  He subsequently underwent repeat bronchoscopy on 5 April that showed that he had complete obstruction of his left mainstem bronchus he underwent cryoablation and tumor debulking.  He underwent a second session on 09 Sep 2021 and this time he had only minimal residual noted from the prior procedure.  His third and final session on that cycle was on 14 October 2021.  There was only minimal residual tissue noted.  He underwent cryoablation of this area.  He tolerated that procedure well and has done well after that.  He had a follow-up from that procedure on 17 November 2021 and at that time was doing well.  He had a chest abdomen and pelvis CT on Thursday 19 November 2021.  This showed that the previously noted soft tissue nodule in the anterior aspect of the left mainstem bronchus was no longer seen following the ablation.  However there were new hepatic lesions and multiple sclerotic osseous metastatic lesions including sternal body T1 T11 L2 and L3.  The patient underwent a DOTATATE PET CT on 14 December 2021 showing no evidence  of activity on the previously ablated bronchial tissue.  There was evidence of hepatic metastasis as well as bony metastasis.  His chromogranin a levels have been trending up and were noted to be greater than 1200.  After this the patient has been referred for [place] treatment and is undergoing this with our interventional radiology department.  He is currently receiving infusions of Lutathera every 8 weeks and will continue lanreotide as scheduled every 4 weeks.   He has been doing  well, he does not endorse any fevers, chills or sweats.  Previously noted shortness of breath has now resolved and he remains symptom-free in this regard.  He does still occasionally get short of breath only on heavy exertion.  He has rare occasional cough that is dry and nonproductive.  No hemoptysis.  No sputum production.  No weight loss or anorexia.  Previously reported back pain due to spinal metastasis but this has now resolved with his current therapy. He has known sclerotic metastatic lesions to the spine.   Today he is somewhat tearful and pensive.  He lost his wife, Jason Malone, the day after Christmas due to RSV pneumonia.  Prior to that she had had issues with mesenteric ischemia and a myocardial infarction.  I offered my condolences.   Review of Systems A 10 point review of systems was performed and it is as noted above otherwise negative.  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   Respiratory failure ([place]) 06/11/2019   Elevated LFTs 06/11/2019   Pneumonia due to COVID-19 virus 06/10/2019   Contracture of palmar fascia 10/11/2018   Microalbuminuria due to type 2 diabetes mellitus ([place]) 04/12/2018   Type 2 diabetes mellitus ([place]) 04/10/2018   Osteopenia determined by x-ray 01/07/2017   Neuroendocrine carcinoma of lung ([place]) 12/28/2016   Low back pain 12/17/2016   Liver mass, left lobe 08/28/2016   Aortic atherosclerosis ([place]) 04/06/2016   Coronary artery disease involving native heart  without angina pectoris 04/06/2016   History of colonic polyps 10/28/2015   Medication monitoring encounter 08/15/2015   Paroxysmal atrial flutter (H[facility])    Elevated serum alkaline phosphatase level 03/05/2015   Allergic rhinitis    BPH without urinary obstruction    Hyperlipidemia 02/13/2014   Orthostasis 07/18/2013   Sinus bradycardia 07/18/2013   S/P lobectomy of lung 06/04/2013   Atrial flutter ([place]) 04/09/2013   Smoking history 04/09/2013   Essential hypertension 04/09/2013   Rectal bleeding 12/15/2012   Social History   Tobacco Use   Smoking status: Former    Packs/day: 1.00    Years: 40.00    Total pack years: 40.00    Types: Cigarettes    Quit date: 12/09/1998    Years since quitting: 23.5   Smokeless tobacco: Never  Substance Use Topics   Alcohol use: No   Allergies  Allergen Reactions   Tape Itching    Surgical tapes    Current Meds  Medication Sig   aspirin EC 81 MG tablet Take 81 mg by mouth daily.   atorvastatin (LIPITOR) 40  MG tablet Take 1 tablet (40 mg total) by mouth daily. for cholesterol.   b complex vitamins capsule Take 1 capsule by mouth daily.   cholecalciferol (VITAMIN D3) 25 MCG (1000 UNIT) tablet Take 1,000 Units by mouth daily.   Cinnamon 500 MG capsule Take 500 mg by mouth at bedtime.   diphenhydrAMINE (BENADRYL) 25 mg capsule Take 25 mg by mouth every morning.   DM-APAP-CPM (CORICIDIN HBP PO) Take 2 tablets by mouth daily as needed (allergies/cold symptoms).   glucose blood (ONETOUCH VERIO) test strip USE 1 STRIP TO CHECK GLUCOSE ONCE DAILY   ipratropium (ATROVENT) 0.03 % nasal spray USE 2 SPRAY(S) IN EACH NOSTRIL THREE TIMES DAILY   losartan (COZAAR) 100 MG tablet Take 1 tablet (100 mg total) by mouth every morning.   Multiple Vitamins-Minerals (MULTIVITAMIN WITH MINERALS) tablet Take 1 tablet by mouth daily.   ondansetron (ZOFRAN) 8 MG tablet Take 1 tablet (8 mg total) by mouth 2 (two) times daily as needed for nausea or vomiting.    polycarbophil (FIBERCON) 625 MG tablet Take 625 mg by mouth 2 (two) times daily.   polyvinyl alcohol (LIQUIFILM TEARS) 1.4 % ophthalmic solution Place 1 drop into both eyes as needed for dry eyes.   vitamin C (ASCORBIC ACID) 500 MG tablet Take 500 mg by mouth daily.   zinc sulfate 220 (50 Zn) MG capsule Take 1 capsule (220 mg total) by mouth daily.   Immunization History  Administered Date(s) Administered   Fluad Quad(high Dose 65+) 01/24/2019, 01/23/2020, 01/30/2021, 02/01/2022   Influenza, High Dose Seasonal PF 02/12/2016, 02/14/2017, 01/17/2018   Influenza,inj,Quad PF,6+ Mos 02/14/2015   PFIZER(Purple Top)SARS-COV-2 Vaccination 05/24/2019, 10/02/2019, 04/14/2020   PNEUMOCOCCAL CONJUGATE-20 12/28/2021   Pneumococcal Conjugate-13 02/01/2014   Pneumococcal Polysaccharide-23 05/10/2008        Objective:   Physical Exam BP 124/80 (BP Location: Left Arm, Cuff Size: Normal)   Pulse (!) 56   Temp 97.8 F (36.6 C)   Ht 5\' 4"  (1.626 m)   Wt 157 lb 12.8 oz (71.6 kg)   SpO2 98%   BMI 27.09 kg/m   SpO2: 98 % O2 Device: None (Room air)  GENERAL: Awake and alert, well developed, well nourished gentleman, in no respiratory distress, fully ambulatory, no conversational dyspnea. HEAD: Normocephalic, atraumatic. EYES: Pupils equal, round, reactive to light.  No scleral icterus. MOUTH: Wears dentures.  No thrush. NECK: Supple. No thyromegaly. No nodules. No JVD.  Trachea is midline PULMONARY:  Symmetrical and good air entry, slightly diminished breath sounds in the left upper lung zone, no wheezes or rhonchi noted. CARDIOVASCULAR: S1 and S2.  Regular rate and rhythm.No rubs murmurs or gallops appreciated. GASTROINTESTINAL: Benign. MUSCULOSKELETAL: No joint deformity, no clubbing, no edema. NEUROLOGIC: No focal deficit, no gait disturbance, speech is fluent. SKIN: Intact,warm,dry. No rashes noted on limited exam. PSYCH: Tearful, pensive.     Assessment & Plan:     ICD-10-CM   1.  Neuroendocrine carcinoma of lung ([place]) -stage IVa  C7A.8    Continue management per oncology Will have reimaging in March (per patient) Will review whether repeat bronchoscopy is necessary at that time    2. Postinflammatory pulmonary fibrosis (HCC)  J84.10    Has not been progressive Patient asymptomatic    3. Personal history of COVID-19  Z86.16    Patient had severe case of COVID-19 leading to pulmonary fibrosis     Will see the patient in late March or early April.  Will be on the alert for his  repeat imaging.  Clinically, he has not had recurrence of bronchial obstruction.  Patient is to contact us prior to follow-up visit should any new issues arise such as increasing shortness of breath, cough, etc.  C. Derrill Kay, MD Advanced Bronchoscopy PCCM [facility]    *This note was dictated using voice recognition software/Dragon.  Despite best efforts to proofread, errors can occur which can change the meaning. Any transcriptional errors that result from this process are unintentional and may not be fully corrected at the time of dictation.

## 2022-06-09 ENCOUNTER — Encounter (HOSPITAL_COMMUNITY)
Admission: RE | Admit: 2022-06-09 | Discharge: 2022-06-09 | Disposition: A | Discharge status: Home / Self Care | Payer: Medicare Other | Source: Ambulatory Visit | Attending: Interventional Radiology | Admitting: Interventional Radiology

## 2022-06-09 VITALS — BP 138/76 | HR 56 | Resp 20

## 2022-06-09 DIAGNOSIS — R160 Hepatomegaly, not elsewhere classified: Secondary | ICD-10-CM | POA: Insufficient documentation

## 2022-06-09 DIAGNOSIS — C7A8 Other malignant neuroendocrine tumors: Secondary | ICD-10-CM | POA: Insufficient documentation

## 2022-06-09 LAB — CBC WITH DIFFERENTIAL/PLATELET
Abs Immature Granulocytes: 0.02 10*3/uL (ref 0.00–0.07)
Basophils Absolute: 0.1 10*3/uL (ref 0.0–0.1)
Basophils Relative: 1 %
Eosinophils Absolute: 0.1 10*3/uL (ref 0.0–0.5)
Eosinophils Relative: 1 %
HCT: 49.4 % (ref 39.0–52.0)
Hemoglobin: 16.1 g/dL (ref 13.0–17.0)
Immature Granulocytes: 0 %
Lymphocytes Relative: 8 %
Lymphs Abs: 0.7 10*3/uL (ref 0.7–4.0)
MCH: 30.2 pg (ref 26.0–34.0)
MCHC: 32.6 g/dL (ref 30.0–36.0)
MCV: 92.7 fL (ref 80.0–100.0)
Monocytes Absolute: 1 10*3/uL (ref 0.1–1.0)
Monocytes Relative: 12 %
Neutro Abs: 6.5 10*3/uL (ref 1.7–7.7)
Neutrophils Relative %: 78 %
Platelets: 153 10*3/uL (ref 150–400)
RBC: 5.33 MIL/uL (ref 4.22–5.81)
RDW: 15.3 % (ref 11.5–15.5)
WBC: 8.4 10*3/uL (ref 4.0–10.5)
nRBC: 0 % (ref 0.0–0.2)

## 2022-06-09 LAB — COMPREHENSIVE METABOLIC PANEL
ALT: 23 U/L (ref 0–44)
AST: 29 U/L (ref 15–41)
Albumin: 4.4 g/dL (ref 3.5–5.0)
Alkaline Phosphatase: 131 U/L — ABNORMAL HIGH (ref 38–126)
Anion gap: 11 (ref 5–15)
BUN: 18 mg/dL (ref 8–23)
CO2: 25 mmol/L (ref 22–32)
Calcium: 9.7 mg/dL (ref 8.9–10.3)
Chloride: 104 mmol/L (ref 98–111)
Creatinine, Ser: 0.73 mg/dL (ref 0.61–1.24)
GFR, Estimated: >60 mL/min (ref >60)
Glucose, Bld: 146 mg/dL — ABNORMAL HIGH (ref 70–99)
Potassium: 3.8 mmol/L (ref 3.5–5.1)
Sodium: 140 mmol/L (ref 135–145)
Total Bilirubin: 0.9 mg/dL (ref 0.3–1.2)
Total Protein: 7.7 g/dL (ref 6.5–8.1)

## 2022-06-09 MED ORDER — ONDANSETRON 8 MG/NS 50 ML IVPB
8.0000 mg | Freq: Once | INTRAVENOUS | Status: DC
  Filled 2022-06-09: qty 54

## 2022-06-09 MED ORDER — OCTREOTIDE ACETATE 30 MG IM KIT
PACK | INTRAMUSCULAR | Status: AC
  Administered 2022-06-09: 30 mg via INTRAMUSCULAR
  Filled 2022-06-09: qty 1

## 2022-06-09 MED ORDER — LUTETIUM LU 177 DOTATATE 370 MBQ/ML IV SOLN
200.0000 | Freq: Once | INTRAVENOUS | Status: AC
  Administered 2022-06-09: 202.6 via INTRAVENOUS

## 2022-06-09 MED ORDER — SODIUM CHLORIDE 0.9 % IV SOLN
8.0000 mg | Freq: Once | INTRAVENOUS | Status: DC
  Administered 2022-06-09: 8 mg via INTRAVENOUS
  Filled 2022-06-09: qty 4

## 2022-06-09 MED ORDER — OCTREOTIDE ACETATE 30 MG IM KIT: 30.0000 mg | PACK | Freq: Once | INTRAMUSCULAR | Status: AC

## 2022-06-09 MED ORDER — ONDANSETRON HCL 8 MG PO TABS: 8.0000 mg | ORAL_TABLET | Freq: Two times a day (BID) | ORAL | 0 refills | Status: DC | PRN

## 2022-06-09 MED ORDER — AMINO ACID RADIOPROTECTANT - L-LYSINE 2.5%/L-ARGININE 2.5% IN NS
250.0000 mL/h | INTRAVENOUS | Status: AC
  Administered 2022-06-09: 250 mL/h via INTRAVENOUS
  Filled 2022-06-09: qty 1000

## 2022-06-09 MED ORDER — SODIUM CHLORIDE 0.9 % IV SOLN
500.0000 mL | Freq: Once | INTRAVENOUS | Status: AC
  Administered 2022-06-09: 500 mL via INTRAVENOUS

## 2022-06-09 MED ORDER — PROCHLORPERAZINE EDISYLATE 10 MG/2ML IJ SOLN: 10.0000 mg | Freq: Four times a day (QID) | INTRAMUSCULAR | Status: DC | PRN

## 2022-06-09 MED ORDER — OCTREOTIDE ACETATE 500 MCG/ML IJ SOLN: 500.0000 ug | Freq: Once | INTRAMUSCULAR | Status: DC | PRN

## 2022-06-09 NOTE — Progress Notes (Signed)
CLINICAL DATA: [age] year-old [male] with metastatic neuroendocrine tumor. Well differentiated tumor with somatostatin receptor is identified within the [liver and RIGHT humerus] by DOTATATE PET CT scan. EXAM: NUCLEAR MEDICINE LUTATHERA ADMINISTRATION TECHNIQUE: Infusion: The nuclear medicine technologist and I personally verified the dose activity ([192] mCi) to be delivered as specified in the written directive (200 mCi), and verified the patient identification via 2 separate methods.  20 gauge IV were started in the antecubital veins. Anti-emetics were administered by nursing staff. Amino acid renal protection was initiated 30 minutes prior to Lu 177 DOTATATE (Lutathera) infusion and continued continuously for 4 hours. Lutathera infusion was administered over 30 minutes.     The total administered dose was [202.6] mCi Lu 177 DOTATATE.   The entire IV tubing, venocatheter, stopcock and syringes was removed in total, placed in a disposal bag and sent for assay of the residual activity, which will be reported at a later time in our EMR by the physics staff. Pressure was applied to the venipuncture sites, and a compression bandage placed. Radiation Safety personnel were present to perform the discharge survey, as detailed on their documentation.   Patient received 30 mg IM long-acting Sandostatin injection 4 hours after Lutathera effusion in the nuclear medicine department.  RADIOPHARMACEUTICALS:   [202.6] mCi Lu 177 DOTATATE  FINDINGS: Diagnosis: [Metastatic neuroendocrine tumor.]   Current Infusion: [3]   Planned Infusions: [4]   Patient reports [minimal] interval symptoms following therapy.  Unfortunately patient's wife passed away on 05/12/23.     The patient's most recent blood counts were reviewed and remains a good candidate to proceed with Lutathera.  No marrow suppression.  Normal renal function hepatic function. Chromogranin pending.   The patient was situated in an  infusion suite and administered Lutathera as above.    Patient will follow-up with referring oncologist for interval serum laboratories (CBC and CMP) in approximately 4 weeks.      Patient received 30 mg IM long-acting Sandostatin injection 4 hours after Lutathera effusion in the nuclear medicine department.    IMPRESSION: [Third] QQ 595 GLOVFIEP treatment for metastatic neuroendocrine tumor. The patient tolerated the infusion well. The patient will return in 8 weeks for ongoing care.     Patient will follow-up with referring oncologist for interval serum laboratories (CBC and CMP) in approximately 4 weeks.

## 2022-06-09 NOTE — Written Directive (Addendum)
MOLECULAR IMAGING AND THERAPEUTICS WRITTEN DIRECTIVE   PATIENT NAME: Jason Malone  PT DOB:   11/09/1940                                              MRN: [id number]  ---------------------------------------------------------------------------------------------------------------------  [facility] THERAPY   RADIOPHARMACEUTICAL:  Lutetium 177 Dotatate (Lutathera)     PRESCRIBED DOSE FOR ADMINISTRATION:  200 mCi   ROUTE OFADMINISTRATION:  IV   DIAGNOSIS:  Neuroendocrine carcinoma   REFERRING PHYSICIAN: Dr. Annamaria Boots   TREATMENT #:[id number]   DATE OF LAST LONG LIVED SOMATOSTATIN INJECTION: 05/12/22   ADDITIONAL PHYSICIAN COMMENTS/NOTES:   AUTHORIZED USER SIGNATURE & TIME STAMP: Rennis Golden, MD   06/09/22    9:38 AM

## 2022-06-10 LAB — CHROMOGRANIN A: Chromogranin A (ng/mL): 1213 ng/mL — ABNORMAL HIGH (ref 0.0–101.8)

## 2022-07-01 ENCOUNTER — Ambulatory Visit (INDEPENDENT_AMBULATORY_CARE_PROVIDER_SITE_OTHER): Payer: Medicare Other | Admitting: Internal Medicine

## 2022-07-01 ENCOUNTER — Encounter: Payer: Self-pay | Admitting: Internal Medicine

## 2022-07-01 VITALS — BP 122/64 | HR 71 | Temp 97.7°F | Resp 16 | Ht 64.0 in | Wt 159.1 lb

## 2022-07-01 DIAGNOSIS — E782 Mixed hyperlipidemia: Secondary | ICD-10-CM | POA: Diagnosis not present

## 2022-07-01 DIAGNOSIS — E119 Type 2 diabetes mellitus without complications: Secondary | ICD-10-CM | POA: Diagnosis not present

## 2022-07-01 DIAGNOSIS — C7A8 Other malignant neuroendocrine tumors: Secondary | ICD-10-CM

## 2022-07-01 DIAGNOSIS — I10 Essential (primary) hypertension: Secondary | ICD-10-CM | POA: Diagnosis not present

## 2022-07-01 LAB — POCT GLYCOSYLATED HEMOGLOBIN (HGB A1C): Hemoglobin A1C: 7 % — AB (ref 4.0–5.6)

## 2022-07-01 MED ORDER — LOSARTAN POTASSIUM 100 MG PO TABS: 100.0000 mg | ORAL_TABLET | ORAL | 1 refills | Status: DC

## 2022-07-01 MED ORDER — ATORVASTATIN CALCIUM 40 MG PO TABS: 40.0000 mg | ORAL_TABLET | Freq: Every day | ORAL | 1 refills | Status: DC

## 2022-07-01 MED ORDER — METFORMIN HCL 500 MG PO TABS: 500.0000 mg | ORAL_TABLET | Freq: Every day | ORAL | 1 refills | Status: DC

## 2022-07-01 NOTE — Progress Notes (Signed)
Established Patient Office Visit  Subjective:  Patient ID: Jason Malone, male    DOB: [birth year]  Age: 82 y.o. MRN: [id number]  CC:  Chief Complaint  Patient presents with   Follow-up    HPI Jason Malone presents for follow-up. Since our last visit the patient's wife [name] passed away right after Christmas. Patient lives alone but his sons and grandchildren live near by. He is getting along ok, goes to church and talks to his pastor.   Hypertension/A.Flutter: -Medications: Losartan 100 mg, had been on amlodipine 7.5 mg but stopped taking it because his blood pressure has been good at home for the last month  -Patient is compliant with above medications and reports no side effects. -Checking BP at home (average): 120-130/70-80 -Denies any SOB, CP, vision changes or symptoms of hypotension.  HLD: -Medications: Lipitor 40 mg -Patient is compliant with above medications and reports no side effects.  -Last lipid panel: Lipid Panel     Component Value Date/Time   CHOL 115 12/28/2021 1452   CHOL 133 02/14/2017 1046   TRIG 113 12/28/2021 1452   HDL 41 12/28/2021 1452   HDL 39 (L) 02/14/2017 1046   CHOLHDL 2.8 12/28/2021 1452   VLDL 9 06/15/2019 0114   LDLCALC 54 12/28/2021 1452   LABVLDL 24 02/14/2017 1046   Diabetes, Type 2: -Last A1c 6.5% 8/23 -Medications: Nothing  -Fasting home BG: 120-130; highest 143  -Eye exam: Scheduled for March -Foot exam: Due today  -Microalbumin: Due today  -Statin: yes -PNA vaccine: yes -Denies symptoms of hypoglycemia, polyuria, polydipsia, numbness extremities, foot ulcers/trauma.   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 Causing some nausea, fatigue. Had one treatment left the end in March.    Past Medical History:  Diagnosis  Date   Allergic rhinitis    Aortic atherosclerosis (HCC)    Benign prostatic hypertrophy with lower urinary tract symptoms (LUTS)    Bradycardia    Chest pain    a. 05/2013 MV: EF 70%, no ischemia.   Chronic respiratory failure with hypoxia (HCC)    COPD (chronic obstructive pulmonary disease) (HCC)    Coronary artery disease    COVID-19    Diabetes mellitus without complication ([place])    last a1c 6.5 09-16-21 no meds   Diverticulitis    DIVERTICULOSIS   Dysrhythmia    Elevated LFTs    Enlarged prostate    Essential hypertension    CONTROLLED ON MEDS   Full dentures    H/O hypokalemia    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 08/28/2016   Hyperlipemia    Hypomagnesemia    IFG (impaired fasting glucose)    Liver cancer ([place]) 12/08/2016   Liver mass, left lobe 08/28/2016   Probably hemangioma; will get MRI of liver, refer to GI   Lung cancer ([place])    a. carcinoid, left lung, Stage 1b (T2a, N0, cM0);  b. 05/2013 s/p VATS & LULobectomy.   Metabolic acidosis    Monocytosis    Osteopenia    Paroxysmal atrial flutter (HCC)    a. 03/2013->no recurrence;  b. CHA2DS2VASc = 2-->not currently on anticoagulation;  c. 05/2012 Echo: EF 55-60%, normal RV.   Pneumonia    Postinflammatory pulmonary fibrosis (HCC)    Rectal bleeding    Smoking history    Wears glasses  Past Surgical History:  Procedure Laterality Date   BRONCHOSCOPY     04/06/2013   CARDIOVASCULAR STRESS TEST  05/2013   a. No evidence of ischemia or infarct, EF 70%, no WMAs   CATARACT EXTRACTION W/PHACO Left 07/14/2015   Procedure: CATARACT EXTRACTION PHACO AND INTRAOCULAR LENS PLACEMENT (IOC);  Surgeon: [name], MD;  Location: [facility];  Service: Ophthalmology;  Laterality: Left;   CATARACT EXTRACTION W/PHACO Right 10/01/2015   Procedure: CATARACT EXTRACTION PHACO AND INTRAOCULAR LENS PLACEMENT (IOC);  Surgeon: [name], MD;  Location: [facility];  Service:  Ophthalmology;  Laterality: Right;   COLONOSCOPY W/ POLYPECTOMY     bleed after-had to go to surgery to stop bleeding via colonoscopy   COLONOSCOPY WITH PROPOFOL N/A 11/19/2015   Procedure: COLONOSCOPY WITH PROPOFOL;  Surgeon: [name], MD;  Location: [facility] END[facility]COPY;  Service: Endoscopy;  Laterality: N/A;   DUPUYTREN CONTRACTURE RELEASE  12/15/2011   Procedure: DUPUYTREN CONTRACTURE RELEASE;  Surgeon: [name], MD;  Location: [facility];  Service: Orthopedics;  Laterality: Left;  Fasciotomy left ring finger dupuytrens   ELECTROMAGNETIC NAVIGATION BROCHOSCOPY Left 09/19/2019   Procedure: ELECTROMAGNETIC NAVIGATION BRONCHOSCOPY;  Surgeon: Tyler Pita, MD;  Location: [facility];  Service: Cardiopulmonary;  Laterality: Left;   FASCIECTOMY Right 11/07/2018   Procedure: SEGMENTAL FASCIECTOMY RIGHT RING FINGER;  Surgeon: Daryll Brod, MD;  Location: [facility];  Service: Orthopedics;  Laterality: Right;  AXILLARY BLOCK   FLEXIBLE BRONCHOSCOPY Left 11/26/2019   Procedure: FLEXIBLE BRONCHOSCOPY WITH CRYO THERAPY;  Surgeon: Tyler Pita, MD;  Location: [facility];  Service: Cardiopulmonary;  Laterality: Left;   FLEXIBLE BRONCHOSCOPY Left 10/14/2021   Procedure: FLEXIBLE BRONCHOSCOPY;  Surgeon: Tyler Pita, MD;  Location: [facility];  Service: Pulmonary;  Laterality: Left;   IR ANGIOGRAM SELECTIVE EACH ADDITIONAL VESSEL  07/26/2017   IR ANGIOGRAM SELECTIVE EACH ADDITIONAL VESSEL  07/26/2017   IR ANGIOGRAM SELECTIVE EACH ADDITIONAL VESSEL  07/26/2017   IR ANGIOGRAM SELECTIVE EACH ADDITIONAL VESSEL  07/26/2017   IR ANGIOGRAM SELECTIVE EACH ADDITIONAL VESSEL  07/26/2017   IR ANGIOGRAM SELECTIVE EACH ADDITIONAL VESSEL  08/11/2017   IR ANGIOGRAM SELECTIVE EACH ADDITIONAL VESSEL  04/28/2018   IR ANGIOGRAM VISCERAL SELECTIVE  07/26/2017   IR ANGIOGRAM VISCERAL SELECTIVE  08/11/2017   IR ANGIOGRAM VISCERAL SELECTIVE  04/28/2018   IR EMBO ARTERIAL NOT HEMORR HEMANG INC  GUIDE ROADMAPPING  07/26/2017   IR EMBO TUMOR ORGAN ISCHEMIA INFARCT INC GUIDE ROADMAPPING  08/11/2017   IR EMBO TUMOR ORGAN ISCHEMIA INFARCT INC GUIDE ROADMAPPING  04/28/2018   IR RADIOLOGIST EVAL & MGMT  07/07/2017   IR RADIOLOGIST EVAL & MGMT  09/07/2017   IR RADIOLOGIST EVAL & MGMT  12/21/2017   IR RADIOLOGIST EVAL & MGMT  03/22/2018   IR RADIOLOGIST EVAL & MGMT  06/08/2018   IR RADIOLOGIST EVAL & MGMT  11/01/2018   IR RADIOLOGIST EVAL & MGMT  03/21/2019   IR RADIOLOGIST EVAL & MGMT  11/08/2019   IR RADIOLOGIST EVAL & MGMT  07/01/2020   IR RADIOLOGIST EVAL & MGMT  12/18/2020   IR RADIOLOGIST EVAL & MGMT  06/18/2021   IR RADIOLOGIST EVAL & MGMT  12/14/2021   IR RADIOLOGIST EVAL & MGMT  12/31/2021   IR US GUIDE VASC ACCESS RIGHT  07/26/2017   IR US GUIDE VASC ACCESS RIGHT  08/11/2017   IR US GUIDE VASC ACCESS RIGHT  04/28/2018   LUNG LOBECTOMY  06/10/13   upper left  lung   MULTIPLE TOOTH EXTRACTIONS     REMOVAL RETAINED LENS Right 11/17/2015   Procedure: REMOVAL RETAINED LENS FROAGMENTS RIGHT EYE;  Surgeon: [name], MD;  Location: [facility];  Service: Ophthalmology;  Laterality: Right;   TONSILLECTOMY     UPPER GASTROINTESTINAL ENDOSCOPY  11-03-15   Dr [name]   VASECTOMY     VIDEO ASSISTED THORACOSCOPY (VATS)/WEDGE RESECTION Left 06/04/2013   Procedure: VIDEO ASSISTED THORACOSCOPY (VATS)/WEDGE RESECTION;  Surgeon: [name], MD;  Location: [facility];  Service: Thoracic;  Laterality: Left;   VIDEO BRONCHOSCOPY N/A 06/04/2013   Procedure: VIDEO BRONCHOSCOPY;  Surgeon: [name], MD;  Location: [facility];  Service: Thoracic;  Laterality: N/A;   VIDEO BRONCHOSCOPY Left 08/12/2021   Procedure: VIDEO BRONCHOSCOPY WITHOUT FLUORO;  Surgeon: Tyler Pita, MD;  Location: [facility];  Service: Cardiopulmonary;  Laterality: Left;   VIDEO BRONCHOSCOPY Left 09/09/2021   Procedure: VIDEO BRONCHOSCOPY WITHOUT FLUORO;  Surgeon: Tyler Pita, MD;  Location: [facility];  Service:  Cardiopulmonary;  Laterality: Left;    Family History  Problem Relation Age of Onset   Stroke Mother    Alzheimer's disease Mother    Hypertension Sister    Hyperlipidemia Sister    Hypertension Brother    Hyperlipidemia Brother    Diabetes Brother     Social History   Socioeconomic History   Marital status: Widowed    Spouse name: [name]   Number of children: 3   Years of education: Not on file   Highest education level: High school graduate  Occupational History   Not on file  Tobacco Use   Smoking status: Former    Packs/day: 1.00    Years: 40.00    Total pack years: 40.00    Types: Cigarettes    Quit date: 12/09/1998    Years since quitting: 23.5   Smokeless tobacco: Never  Vaping Use   Vaping Use: Never used  Substance and Sexual Activity   Alcohol use: No   Drug use: No   Sexual activity: Yes    Partners: Female  Other Topics Concern   Not on file  Social History Narrative   Not on file   Social Determinants of Health   Financial Resource Strain: Low Risk  (06/12/2018)   Overall Financial Resource Strain (CARDIA)    Difficulty of Paying Living Expenses: Not hard at all  Food Insecurity: No Food Insecurity (06/12/2018)   Hunger Vital Sign    Worried About Running Out of Food in the Last Year: Never true    [place] in the Last Year: Never true  Transportation Needs: No Transportation Needs (06/12/2018)   PRAPARE - Hydrologist (Medical): No    Lack of Transportation (Non-Medical): No  Physical Activity: Sufficiently Active (06/12/2018)   Exercise Vital Sign    Days of Exercise per Week: 7 days    Minutes of Exercise per Session: 60 min  Stress: No Stress Concern Present (06/12/2018)   [place]    Feeling of Stress : Not at all  Social Connections: Somewhat Isolated (06/12/2018)   Social Connection and Isolation Panel [NHANES]    Frequency of Communication  with Friends and Family: More than three times a week    Frequency of Social Gatherings with Friends and Family: More than three times a week    Attends Religious Services: Never    Retail buyer of [employer]  or Organizations: No    Attends Archivist Meetings: Never    Marital Status: Married  Human resources officer Violence: Not At Risk (06/12/2018)   Humiliation, Afraid, Rape, and Kick questionnaire    Fear of Current or Ex-Partner: No    Emotionally Abused: No    Physically Abused: No    Sexually Abused: No    Outpatient Medications Prior to Visit  Medication Sig Dispense Refill   aspirin EC 81 MG tablet Take 81 mg by mouth daily.     b complex vitamins capsule Take 1 capsule by mouth daily.     cholecalciferol (VITAMIN D3) 25 MCG (1000 UNIT) tablet Take 1,000 Units by mouth daily.     Cinnamon 500 MG capsule Take 500 mg by mouth at bedtime.     diphenhydrAMINE (BENADRYL) 25 mg capsule Take 25 mg by mouth every morning.     DM-APAP-CPM (CORICIDIN HBP PO) Take 2 tablets by mouth daily as needed (allergies/cold symptoms).     glucose blood (ONETOUCH VERIO) test strip USE 1 STRIP TO CHECK GLUCOSE ONCE DAILY 100 each 3   ipratropium (ATROVENT) 0.03 % nasal spray USE 2 SPRAY(S) IN EACH NOSTRIL THREE TIMES DAILY 30 mL 0   Multiple Vitamins-Minerals (MULTIVITAMIN WITH MINERALS) tablet Take 1 tablet by mouth daily.     ondansetron (ZOFRAN) 8 MG tablet Take 1 tablet (8 mg total) by mouth 2 (two) times daily as needed for nausea or vomiting. 20 tablet 0   polycarbophil (FIBERCON) 625 MG tablet Take 625 mg by mouth 2 (two) times daily.     polyvinyl alcohol (LIQUIFILM TEARS) 1.4 % ophthalmic solution Place 1 drop into both eyes as needed for dry eyes.     vitamin C (ASCORBIC ACID) 500 MG tablet Take 500 mg by mouth daily.     zinc sulfate 220 (50 Zn) MG capsule Take 1 capsule (220 mg total) by mouth daily. 30 capsule 0   atorvastatin (LIPITOR) 40 MG tablet Take 1 tablet (40 mg total) by mouth  daily. for cholesterol. 90 tablet 1   losartan (COZAAR) 100 MG tablet Take 1 tablet (100 mg total) by mouth every morning. 90 tablet 1   Facility-Administered Medications Prior to Visit  Medication Dose Route Frequency Provider Last Rate Last Admin   lanreotide acetate (SOMATULINE DEPOT) injection 120 mg  120 mg Subcutaneous Once Lloyd Huger, MD        Allergies  Allergen Reactions   Tape Itching    Surgical tapes     ROS Review of Systems  Constitutional:  Negative for chills and fever.  Eyes:  Negative for visual disturbance.  Respiratory:  Negative for cough, shortness of breath and wheezing.   Cardiovascular:  Negative for chest pain.  Neurological:  Negative for dizziness.      Objective:    Physical Exam Constitutional:      Appearance: Normal appearance.  HENT:     Head: Normocephalic and atraumatic.     Mouth/Throat:     Comments: PND present Eyes:     Conjunctiva/sclera: Conjunctivae normal.  Cardiovascular:     Rate and Rhythm: Normal rate and regular rhythm.     Pulses:          Dorsalis pedis pulses are 2+ on the right side and 2+ on the left side.  Pulmonary:     Effort: Pulmonary effort is normal.     Breath sounds: Normal breath sounds.  Musculoskeletal:     Right lower leg: No  edema.     Left lower leg: No edema.     Right foot: Normal range of motion. No deformity, bunion, Charcot foot, foot drop or prominent metatarsal heads.     Left foot: Normal range of motion. No deformity, bunion, Charcot foot, foot drop or prominent metatarsal heads.  Feet:     Right foot:     Protective Sensation: 6 sites tested.  6 sites sensed.     Skin integrity: Skin integrity normal.     Toenail Condition: Right toenails are normal.     Left foot:     Protective Sensation: 6 sites tested.  6 sites sensed.     Skin integrity: Skin integrity normal.     Toenail Condition: Left toenails are normal.  Skin:    General: Skin is warm and dry.  Neurological:      General: No focal deficit present.     Mental Status: He is alert. Mental status is at baseline.  Psychiatric:        Mood and Affect: Mood normal.        Behavior: Behavior normal.     BP 122/64   Pulse 71   Temp 97.7 F (36.5 C)   Resp 16   Ht 5\' 4"  (1.626 m)   Wt 159 lb 1.6 oz (72.2 kg)   SpO2 94%   BMI 27.31 kg/m  Wt Readings from Last 3 Encounters:  07/01/22 159 lb 1.6 oz (72.2 kg)  06/08/22 157 lb 12.8 oz (71.6 kg)  04/12/22 155 lb 2 oz (70.4 kg)     Health Maintenance Due  Topic Date Due   Medicare Annual Wellness (AWV)  Never done   Diabetic kidney evaluation - Urine ACR  04/11/2019    There are no preventive care reminders to display for this patient.  Lab Results  Component Value Date   TSH 4.221 06/08/2013   Lab Results  Component Value Date   WBC 8.4 06/09/2022   HGB 16.1 06/09/2022   HCT 49.4 06/09/2022   MCV 92.7 06/09/2022   PLT 153 06/09/2022   Lab Results  Component Value Date   NA 140 06/09/2022   K 3.8 06/09/2022   CO2 25 06/09/2022   GLUCOSE 146 (H) 06/09/2022   BUN 18 06/09/2022   CREATININE 0.73 06/09/2022   BILITOT 0.9 06/09/2022   ALKPHOS 131 (H) 06/09/2022   AST 29 06/09/2022   ALT 23 06/09/2022   PROT 7.7 06/09/2022   ALBUMIN 4.4 06/09/2022   CALCIUM 9.7 06/09/2022   ANIONGAP 11 06/09/2022   Lab Results  Component Value Date   CHOL 115 12/28/2021   Lab Results  Component Value Date   HDL 41 12/28/2021   Lab Results  Component Value Date   LDLCALC 54 12/28/2021   Lab Results  Component Value Date   TRIG 113 12/28/2021   Lab Results  Component Value Date   CHOLHDL 2.8 12/28/2021   Lab Results  Component Value Date   HGBA1C 7.0 (A) 07/01/2022      Assessment & Plan:   1. Type 2 diabetes mellitus without complication, without long-term current use of insulin ([place]): A1c slightly increased to 7.0%. Patient willing to try Metformin. I will prescribe 500 mg once daily and we will recheck at high follow up. He  will continue to monitor blood sugar at home and let me know if it starts to increase. Foot exam and microalbumin today.   - POCT HgB A1C - Urine Microalbumin w/creat. ratio -  HM Diabetes Foot Exam - metFORMIN (GLUCOPHAGE) 500 MG tablet; Take 1 tablet (500 mg total) by mouth daily with breakfast.  Dispense: 90 tablet; Refill: 1  2. Essential hypertension: Stable, continue Losartan 100 mg, refilled.   - losartan (COZAAR) 100 MG tablet; Take 1 tablet (100 mg total) by mouth every morning.  Dispense: 90 tablet; Refill: 1  3. Mixed hyperlipidemia: Stable, continue Lipitor 40 mg, refilled.   - atorvastatin (LIPITOR) 40 MG tablet; Take 1 tablet (40 mg total) by mouth daily. for cholesterol.  Dispense: 90 tablet; Refill: 1  4. Neuroendocrine carcinoma of lung ([place]): Following with Pulmonology and IR, note from 06/08/22 and 06/09/22 respectively reviewed. Patient planning on one final treatment the end of March.    Follow-up: Return in 6 months (on 12/30/2022).    Teodora Medici, DO

## 2022-07-01 NOTE — Patient Instructions (Signed)
It was great seeing you today!  Plan discussed at today's visit: -Start Metformin 500 mg once daily - if you have abdominal pain, bloating or diarrhea please let me know -Other medications refilled -A1c 7.0%, continue to monitor blood sugar in the mornings   Follow up in: 6 months   Take care and let us know if you have any questions or concerns prior to your next visit.  Dr. Rosana Berger

## 2022-07-02 LAB — MICROALBUMIN / CREATININE URINE RATIO
Creatinine, Urine: 93 mg/dL (ref 20–320)
Microalb Creat Ratio: 35 mcg/mg creat — ABNORMAL HIGH (ref <30)
Microalb, Ur: 3.3 mg/dL

## 2022-07-07 ENCOUNTER — Inpatient Hospital Stay: Payer: Medicare Other | Attending: Oncology

## 2022-07-07 DIAGNOSIS — Z79899 Other long term (current) drug therapy: Secondary | ICD-10-CM | POA: Insufficient documentation

## 2022-07-07 DIAGNOSIS — C7A8 Other malignant neuroendocrine tumors: Secondary | ICD-10-CM | POA: Diagnosis not present

## 2022-07-07 MED ORDER — LANREOTIDE ACETATE 120 MG/0.5ML ~~LOC~~ SOLN
120.0000 mg | Freq: Once | SUBCUTANEOUS | Status: AC
  Administered 2022-07-07: 120 mg via SUBCUTANEOUS
  Filled 2022-07-07: qty 120

## 2022-07-22 ENCOUNTER — Other Ambulatory Visit: Payer: Self-pay | Admitting: Pulmonary Disease

## 2022-07-29 DIAGNOSIS — Z961 Presence of intraocular lens: Secondary | ICD-10-CM | POA: Diagnosis not present

## 2022-07-29 DIAGNOSIS — H35371 Puckering of macula, right eye: Secondary | ICD-10-CM | POA: Diagnosis not present

## 2022-07-29 DIAGNOSIS — E119 Type 2 diabetes mellitus without complications: Secondary | ICD-10-CM | POA: Diagnosis not present

## 2022-07-29 LAB — HM DIABETES EYE EXAM

## 2022-08-02 ENCOUNTER — Encounter: Payer: Self-pay | Admitting: Pulmonary Disease

## 2022-08-02 ENCOUNTER — Ambulatory Visit (INDEPENDENT_AMBULATORY_CARE_PROVIDER_SITE_OTHER): Payer: Medicare Other | Admitting: Pulmonary Disease

## 2022-08-02 VITALS — BP 124/80 | HR 51 | Temp 97.1°F | Ht 64.0 in | Wt 159.4 lb

## 2022-08-02 DIAGNOSIS — Z8616 Personal history of COVID-19: Secondary | ICD-10-CM

## 2022-08-02 DIAGNOSIS — J8410 Pulmonary fibrosis, unspecified: Secondary | ICD-10-CM | POA: Diagnosis not present

## 2022-08-02 DIAGNOSIS — C7A8 Other malignant neuroendocrine tumors: Secondary | ICD-10-CM

## 2022-08-02 NOTE — Patient Instructions (Signed)
Your lungs sounded very good today.  We will see on follow-up in 3 months time.  Will follow-up on your next PET/CT.

## 2022-08-02 NOTE — Progress Notes (Signed)
Subjective:    Patient ID: Jason Malone, male    DOB: [birth year], 82 y.o.   MRN: [id number] Patient Care Team: Teodora Medici, DO as PCP - General (Internal Medicine) Minna Merritts, MD as PCP - Cardiology (Cardiology) [name], Forest Gleason, MD (General Surgery) Nestor Lewandowsky, MD (Inactive) as Referring Physician (Cardiothoracic Surgery) Minna Merritts, MD as Consulting Physician (Cardiology) [name], MD (Inactive) as Consulting Physician (Cardiothoracic Surgery) Lloyd Huger, MD as Consulting Physician (Oncology) Arnetha Courser, MD as Attending Physician [facility])  Chief Complaint  Patient presents with   Follow-up    SOB with exertion. No wheezing. Cough when he lays on his back.    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  I saw him on 08 June 2022.  This is a scheduled visit.  Patient had previously undergone resection for carcinoid tumor in 2015 this involved a left upper lobectomy that was a challenging dissection according to the thoracic surgeons notes.  He had recurrence noted in August 2018 when liver involvement was noted.  In January 2021 he has severe case of COVID-19 and was admitted to the hospital due to respiratory failure for the same.  CT scan was performed 11 May 2019 that showed involvement of COVID in the right lung however the left lung had a lobulated mass on the left mainstem bronchus and appeared to be arising from the suture line of the prior resection.  After he recovered from [place] he remained oxygen dependent and with increased dyspnea.  He was evaluated initially on 31 July 2019 for potential bronchoscopy.  However the patient remained very debilitated after his COVID diagnosis and required a period of stabilization.  Subsequently he had cryoablation and tumor destruction on 19 Sep 2019 this was followed  by further tumor destruction on 10 October 2019 and a third treatment on 26 November 2019.  He did well after that with only minimal residual noted.  The tumor was at the prior suture line.  Patient did well until August 2022 then he was noted that he had recurrence at the suture line.  The patient elected to postpone procedures at that time but on 12 June 2021 he had a chest CT with contrast that showed that the soft tissue in the anterior aspect of the left mainstem bronchus had continued to increase.  He started noticing more shortness of breath.  He had another bout with COVID but subsequently was able to undergo repeat bronchoscopy on 12 August 2021 and at that time he had complete obstruction of his left mainstem bronchus.  He underwent cryoablation, tumor debulking followed by a second session on 09 Sep 2021.  This time he had only minimal residual noted.  He completed his third cycle on 14 October 2021.  At that time only minimal residual tissue was noted and he underwent cryoablation to the area.  Tolerated that well.  The patient has been noted to have osseous metastatic lesions as well as hepatic lesions and is now on Lutathera treatment soon to get his last infusion.  Most recent dotatate PET CT was on 14 December 2021 showing no evidence of activity on the previously ablated bronchial tissue.  During his last visit he was doing well respiratory wise however was grieving due to the loss of his wife,Jason Malone.  He has been doing well, he does not  endorse any fevers, chills or sweats. He does still occasionally get short of breath only on heavy exertion.  He has rare occasional cough that is dry and nonproductive.  No hemoptysis.  No sputum production.  No weight loss or anorexia.  Previously reported back pain due to spinal metastasis but this has now resolved with his current therapy. He has known sclerotic metastatic lesions to the spine.    Review of Systems A 10 point review of systems was performed and it is  as noted above otherwise negative.     Current Meds  Medication Sig   aspirin EC 81 MG tablet Take 81 mg by mouth daily.   atorvastatin (LIPITOR) 40 MG tablet Take 1 tablet (40 mg total) by mouth daily. for cholesterol.   b complex vitamins capsule Take 1 capsule by mouth daily.   cholecalciferol (VITAMIN D3) 25 MCG (1000 UNIT) tablet Take 1,000 Units by mouth daily.   Cinnamon 500 MG capsule Take 500 mg by mouth at bedtime.   DM-APAP-CPM (CORICIDIN HBP PO) Take 2 tablets by mouth daily as needed (allergies/cold symptoms).   glucose blood (ONETOUCH VERIO) test strip USE 1 STRIP TO CHECK GLUCOSE ONCE DAILY   ipratropium (ATROVENT) 0.03 % nasal spray USE 2 SPRAY(S) IN EACH NOSTRIL THREE TIMES DAILY   losartan (COZAAR) 100 MG tablet Take 1 tablet (100 mg total) by mouth every morning.   Multiple Vitamins-Minerals (MULTIVITAMIN WITH MINERALS) tablet Take 1 tablet by mouth daily.   ondansetron (ZOFRAN) 8 MG tablet Take 1 tablet (8 mg total) by mouth 2 (two) times daily as needed for nausea or vomiting.   polycarbophil (FIBERCON) 625 MG tablet Take 625 mg by mouth 2 (two) times daily.   polyvinyl alcohol (LIQUIFILM TEARS) 1.4 % ophthalmic solution Place 1 drop into both eyes as needed for dry eyes.   vitamin C (ASCORBIC ACID) 500 MG tablet Take 500 mg by mouth daily.   zinc sulfate 220 (50 Zn) MG capsule Take 1 capsule (220 mg total) by mouth daily.       Objective:   Physical Exam BP 124/80 (BP Location: Left Arm, Cuff Size: Normal)   Pulse (!) 51   Temp (!) 97.1 F (36.2 C)   Ht 5\' 4"  (1.626 m)   Wt 159 lb 6.4 oz (72.3 kg)   SpO2 100%   BMI 27.36 kg/m   SpO2: 100 % O2 Device: None (Room air)  GENERAL: Awake and alert, well developed, well nourished gentleman, in no respiratory distress, fully ambulatory, no conversational dyspnea. HEAD: Normocephalic, atraumatic. EYES: Pupils equal, round, reactive to light.  No scleral icterus. MOUTH: Wears dentures.  No thrush. NECK: Supple.  No thyromegaly. No nodules. No JVD.  Trachea is midline PULMONARY:  Symmetrical and good air entry,no wheezes or rhonchi noted, no crackles. CARDIOVASCULAR: S1 and S2.  Regular rate and rhythm.No rubs murmurs or gallops appreciated. GASTROINTESTINAL: Benign. MUSCULOSKELETAL: No joint deformity, no clubbing, no edema. NEUROLOGIC: No focal deficit, no gait disturbance, speech is fluent. SKIN: Intact,warm,dry. No rashes noted on limited exam. PSYCH: Mood and behavior normal.     Assessment & Plan:     ICD-10-CM   1. Neuroendocrine carcinoma of lung ([place]) -stage IVa  C7A.8    Continue management per oncology To have dotatate PET after Lutathera treatment completed Will review then    2. Postinflammatory pulmonary fibrosis (HCC)  J84.10    Clinically no evidence of progression    3. Personal history of COVID-19  Z86.16  Has had COVID x 2     [place] has had no recurrence of the obstruction.  Await follow-up imaging after Lutathera treatment.  Continue follow-up with oncology as scheduled.  Will see the patient in follow-up in 3 months time he is to contact us prior to that time should any new difficulties arise.  Renold Don, MD Advanced Bronchoscopy PCCM [facility]    *This note was dictated using voice recognition software/Dragon.  Despite best efforts to proofread, errors can occur which can change the meaning. Any transcriptional errors that result from this process are unintentional and may not be fully corrected at the time of dictation.

## 2022-08-03 NOTE — Written Directive (Addendum)
MOLECULAR IMAGING AND THERAPEUTICS WRITTEN DIRECTIVE   PATIENT NAME: Jason Malone  PT DOB:   02-13-1941                                              MRN: [id number]  ---------------------------------------------------------------------------------------------------------------------  [facility]   RADIOPHARMACEUTICAL:  Lutetium 177 Dotatate (Lutathera)     PRESCRIBED DOSE FOR ADMINISTRATION:  200 mCi   ROUTE OFADMINISTRATION:  IV   DIAGNOSIS:  Metastatic neuroendocrine tumor    REFERRING PHYSICIAN: Dr. Berdine Dance   TREATMENT #: 4   DATE OF LAST LONG LIVED SOMATOSTATIN INJECTION: 06/09/2022   ADDITIONAL PHYSICIAN COMMENTS/NOTES:   AUTHORIZED USER SIGNATURE & TIME STAMP: Patriciaann Clan, MD   08/24/22    9:57 AM

## 2022-08-04 ENCOUNTER — Encounter (HOSPITAL_COMMUNITY)
Admission: RE | Admit: 2022-08-04 | Discharge: 2022-08-04 | Disposition: A | Discharge status: Home / Self Care | Payer: Medicare Other | Source: Ambulatory Visit | Attending: Interventional Radiology | Admitting: Interventional Radiology

## 2022-08-04 VITALS — BP 162/75 | HR 55

## 2022-08-04 DIAGNOSIS — C7A1 Malignant poorly differentiated neuroendocrine tumors: Secondary | ICD-10-CM | POA: Diagnosis not present

## 2022-08-04 DIAGNOSIS — C7A8 Other malignant neuroendocrine tumors: Secondary | ICD-10-CM | POA: Insufficient documentation

## 2022-08-04 LAB — CBC WITH DIFFERENTIAL/PLATELET
Abs Immature Granulocytes: 0.03 10*3/uL (ref 0.00–0.07)
Basophils Absolute: 0.1 10*3/uL (ref 0.0–0.1)
Basophils Relative: 1 %
Eosinophils Absolute: 0.1 10*3/uL (ref 0.0–0.5)
Eosinophils Relative: 1 %
HCT: 48.7 % (ref 39.0–52.0)
Hemoglobin: 16 g/dL (ref 13.0–17.0)
Immature Granulocytes: 0 %
Lymphocytes Relative: 7 %
Lymphs Abs: 0.5 10*3/uL — ABNORMAL LOW (ref 0.7–4.0)
MCH: 30.8 pg (ref 26.0–34.0)
MCHC: 32.9 g/dL (ref 30.0–36.0)
MCV: 93.7 fL (ref 80.0–100.0)
Monocytes Absolute: 1.1 10*3/uL — ABNORMAL HIGH (ref 0.1–1.0)
Monocytes Relative: 14 %
Neutro Abs: 5.8 10*3/uL (ref 1.7–7.7)
Neutrophils Relative %: 77 %
Platelets: 146 10*3/uL — ABNORMAL LOW (ref 150–400)
RBC: 5.2 MIL/uL (ref 4.22–5.81)
RDW: 14.3 % (ref 11.5–15.5)
WBC: 7.6 10*3/uL (ref 4.0–10.5)
nRBC: 0 % (ref 0.0–0.2)

## 2022-08-04 LAB — COMPREHENSIVE METABOLIC PANEL
ALT: 36 U/L (ref 0–44)
AST: 34 U/L (ref 15–41)
Albumin: 4.2 g/dL (ref 3.5–5.0)
Alkaline Phosphatase: 119 U/L (ref 38–126)
Anion gap: 8 (ref 5–15)
BUN: 21 mg/dL (ref 8–23)
CO2: 26 mmol/L (ref 22–32)
Calcium: 9.2 mg/dL (ref 8.9–10.3)
Chloride: 106 mmol/L (ref 98–111)
Creatinine, Ser: 1.01 mg/dL (ref 0.61–1.24)
GFR, Estimated: >60 mL/min (ref >60)
Glucose, Bld: 140 mg/dL — ABNORMAL HIGH (ref 70–99)
Potassium: 4 mmol/L (ref 3.5–5.1)
Sodium: 140 mmol/L (ref 135–145)
Total Bilirubin: 0.8 mg/dL (ref 0.3–1.2)
Total Protein: 6.6 g/dL (ref 6.5–8.1)

## 2022-08-04 MED ORDER — PROCHLORPERAZINE EDISYLATE 10 MG/2ML IJ SOLN: 10.0000 mg | Freq: Four times a day (QID) | INTRAMUSCULAR | Status: DC | PRN

## 2022-08-04 MED ORDER — SODIUM CHLORIDE 0.9 % IV SOLN: Freq: Once | INTRAVENOUS | Status: AC

## 2022-08-04 MED ORDER — SODIUM CHLORIDE 0.9 % IV SOLN
8.0000 mg | Freq: Once | INTRAVENOUS | Status: DC
  Administered 2022-08-04: 8 mg via INTRAVENOUS

## 2022-08-04 MED ORDER — OCTREOTIDE ACETATE 30 MG IM KIT: 30.0000 mg | PACK | Freq: Once | INTRAMUSCULAR | Status: AC

## 2022-08-04 MED ORDER — OCTREOTIDE ACETATE 500 MCG/ML IJ SOLN: 500.0000 ug | Freq: Once | INTRAMUSCULAR | Status: DC | PRN

## 2022-08-04 MED ORDER — SODIUM CHLORIDE 0.9 % IV SOLN
8.0000 mg | Freq: Once | INTRAVENOUS | Status: AC
  Administered 2022-08-04: 8 mg via INTRAVENOUS
  Filled 2022-08-04: qty 4

## 2022-08-04 MED ORDER — OCTREOTIDE ACETATE 30 MG IM KIT
PACK | INTRAMUSCULAR | Status: AC
  Administered 2022-08-04: 30 mg via INTRAMUSCULAR
  Filled 2022-08-04: qty 1

## 2022-08-04 MED ORDER — LUTETIUM LU 177 DOTATATE 370 MBQ/ML IV SOLN
200.0000 | Freq: Once | INTRAVENOUS | Status: AC
  Administered 2022-08-04: 204.6 via INTRAVENOUS

## 2022-08-04 NOTE — Written Directive (Signed)
MOLECULAR IMAGING AND THERAPEUTICS WRITTEN DIRECTIVE   PATIENT NAME: Jason Malone  PT DOB:   1940/05/11                                              MRN: [id number]  ---------------------------------------------------------------------------------------------------------------------  [facility] THERAP[facility]   RADIOPHARMACEUTICAL:  Lutetium 177 Dotatate (Lutathera)     PRESCRIBED DOSE FOR ADMINISTRATION:  200 mCi   ROUTE OFADMINISTRATION:  IV   DIAGNOSIS:  Neuroendocrine carcinoma   REFERRING PHYSICIAN: Dr. Annamaria Boots   TREATMENT #: 4   DATE OF LAST LONG LIVED SOMATOSTATIN INJECTION:    ADDITIONAL PHYSICIAN COMMENTS/NOTES:   AUTHORIZED USER SIGNATURE & TIME STAMP:

## 2022-08-09 ENCOUNTER — Other Ambulatory Visit (HOSPITAL_COMMUNITY): Payer: Self-pay | Admitting: Interventional Radiology

## 2022-08-09 DIAGNOSIS — C7B8 Other secondary neuroendocrine tumors: Secondary | ICD-10-CM

## 2022-08-23 ENCOUNTER — Other Ambulatory Visit: Payer: Self-pay | Admitting: Internal Medicine

## 2022-08-23 DIAGNOSIS — E119 Type 2 diabetes mellitus without complications: Secondary | ICD-10-CM

## 2022-08-24 NOTE — Telephone Encounter (Signed)
Requested Prescriptions  Pending Prescriptions Disp Refills   glucose blood (ONETOUCH VERIO) test strip [Pharmacy Med Name: ONETOUCH VERIO TES] 100 each 2    Sig: USE 1 STRIP TO CHECK GLUCOSE ONCE DAILY     Endocrinology: Diabetes - Testing Supplies Passed - 08/23/2022  7:56 AM      Passed - Valid encounter within last 12 months    Recent Outpatient Visits           1 month ago Type 2 diabetes mellitus without complication, without long-term current use of insulin [facility])   [place] [facility] Margarita Mail, DO   7 months ago Essential hypertension   [facility] Margarita Mail, DO   11 months ago Essential hypertension   [facility] Margarita Mail, DO   1 year ago Essential hypertension   [place] [facility] Margarita Mail, DO   1 year ago Type 2 diabetes mellitus without complication, without long-term current use of insulin [facility])   [place] [facility] Margarita Mail, DO       Future Appointments             In 4 months Margarita Mail, DO [facility], [facility]

## 2022-08-31 ENCOUNTER — Other Ambulatory Visit (HOSPITAL_COMMUNITY): Payer: Medicare Other

## 2022-09-01 ENCOUNTER — Inpatient Hospital Stay: Payer: Medicare Other | Attending: Oncology

## 2022-09-01 DIAGNOSIS — Z79899 Other long term (current) drug therapy: Secondary | ICD-10-CM | POA: Insufficient documentation

## 2022-09-01 DIAGNOSIS — C7A8 Other malignant neuroendocrine tumors: Secondary | ICD-10-CM | POA: Diagnosis not present

## 2022-09-01 MED ORDER — LANREOTIDE ACETATE 120 MG/0.5ML ~~LOC~~ SOLN
120.0000 mg | Freq: Once | SUBCUTANEOUS | Status: AC
  Administered 2022-09-01: 120 mg via SUBCUTANEOUS
  Filled 2022-09-01: qty 120

## 2022-09-02 NOTE — Progress Notes (Signed)
Established Patient Office Visit  Subjective:  Patient ID: Jason Malone, male    DOB: 09/13/1940  Age: 82 y.o. MRN: [id number]  CC:  Chief Complaint  Patient presents with   Hypertension    HPI Jason Malone presents for concerns about blood pressure. Patient states about 6 days ago he was laying his side and felt his heart beat strongly and loudly. He checked his BP and it was 196/94. He started slowly taking Amlodipine back and is now up to 10 mg daily. BP has been 120-130/80-90 the last 3 days.   Hypertension/A.Flutter: -Medications: Losartan 100 mg, now taking Amlodipine 10 mg  -Patient is compliant with above medications and reports no side effects. -Checking BP at home (average): 120-130/80-90 last 3 days on current regimen  -Denies any SOB, CP, vision changes or symptoms of hypotension.  HLD: -Medications: Lipitor 40 mg -Patient is compliant with above medications and reports no side effects.  -Last lipid panel: Lipid Panel     Component Value Date/Time   CHOL 115 12/28/2021 1452   CHOL 133 02/14/2017 1046   TRIG 113 12/28/2021 1452   HDL 41 12/28/2021 1452   HDL 39 (L) 02/14/2017 1046   CHOLHDL 2.8 12/28/2021 1452   VLDL 9 06/15/2019 0114   LDLCALC 54 12/28/2021 1452   LABVLDL 24 02/14/2017 1046   Diabetes, Type 2: -Last A1c 7.0% -Medications: Started on Metformin at LOV but could not tolerate, not on anything now -Fasting home BG: 140 or less  -Eye exam: UTD 3/24 -Foot exam: UTD 2/24 -Microalbumin: UTD 2/24 -Statin: yes -PNA vaccine: yes -Denies symptoms of hypoglycemia, polyuria, polydipsia, numbness extremities, foot ulcers/trauma.   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 Causing some nausea, fatigue. Last treatment was end of March, thinking this could be  increasing BP.   Past Medical History:  Diagnosis Date   Allergic rhinitis    Aortic atherosclerosis (HCC)    Benign prostatic hypertrophy with lower urinary tract symptoms (LUTS)    Bradycardia    Chest pain    a. 05/2013 MV: EF 70%, no ischemia.   Chronic respiratory failure with hypoxia (HCC)    COPD (chronic obstructive pulmonary disease) (HCC)    Coronary artery disease    COVID-19    Diabetes mellitus without complication (HCC)    last a1c 6.5 09-16-21 no meds   Diverticulitis    DIVERTICULOSIS   Dysrhythmia    Elevated LFTs    Enlarged prostate    Essential hypertension    CONTROLLED ON MEDS   Full dentures    H/O hypokalemia    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 08/28/2016   Hyperlipemia    Hypomagnesemia    IFG (impaired fasting glucose)    Liver cancer (HCC) 12/08/2016   Liver mass, left lobe 08/28/2016   Probably hemangioma; will get MRI of liver, refer to GI   Lung cancer (HCC)    a. carcinoid, left lung, Stage 1b (T2a, N0, cM0);  b. 05/2013 s/p VATS & LULobectomy.   Metabolic acidosis    Monocytosis    Osteopenia    Paroxysmal atrial flutter (HCC)    a. 03/2013->no recurrence;  b. CHA2DS2VASc = 2-->not currently on anticoagulation;  c. 05/2012 Echo: EF 55-60%, normal RV.   Pneumonia    Postinflammatory pulmonary fibrosis (HCC)    Rectal bleeding  Smoking history    Wears glasses     Past Surgical History:  Procedure Laterality Date   BRONCHOSCOPY     04/06/2013   CARDIOVASCULAR STRESS TEST  05/2013   a. No evidence of ischemia or infarct, EF 70%, no WMAs   CATARACT EXTRACTION W/PHACO Left 07/14/2015   Procedure: CATARACT EXTRACTION PHACO AND INTRAOCULAR LENS PLACEMENT (IOC);  Surgeon: Lockie Mola, MD;  Location: [facility];  Service: Ophthalmology;  Laterality: Left;   CATARACT EXTRACTION W/PHACO Right 10/01/2015   Procedure: CATARACT EXTRACTION PHACO AND INTRAOCULAR LENS PLACEMENT (IOC);  Surgeon: Lockie Mola,  MD;  Location: [facility];  Service: Ophthalmology;  Laterality: Right;   COLONOSCOPY W/ POLYPECTOMY     bleed after-had to go to surgery to stop bleeding via colonoscopy   COLONOSCOPY WITH PROPOFOL N/A 11/19/2015   Procedure: COLONOSCOPY WITH PROPOFOL;  Surgeon: [name], MD;  Location: [facility];  Service: Endoscopy;  Laterality: N/A;   DUPUYTREN CONTRACTURE RELEASE  12/15/2011   Procedure: DUPUYTREN CONTRACTURE RELEASE;  Surgeon: Nicki Reaper, MD;  Location: [facility];  Service: Orthopedics;  Laterality: Left;  Fasciotomy left ring finger dupuytrens   ELECTROMAGNETIC NAVIGATION BROCHOSCOPY Left 09/19/2019   Procedure: ELECTROMAGNETIC NAVIGATION BRONCHOSCOPY;  Surgeon: Salena Saner, MD;  Location: [facility];  Service: Cardiopulmonary;  Laterality: Left;   FASCIECTOMY Right 11/07/2018   Procedure: SEGMENTAL FASCIECTOMY RIGHT RING FINGER;  Surgeon: Cindee Salt, MD;  Location: [facility];  Service: Orthopedics;  Laterality: Right;  AXILLARY BLOCK   FLEXIBLE BRONCHOSCOPY Left 11/26/2019   Procedure: FLEXIBLE BRONCHOSCOPY WITH CRYO THERAPY;  Surgeon: Salena Saner, MD;  Location: [facility];  Service: Cardiopulmonary;  Laterality: Left;   FLEXIBLE BRONCHOSCOPY Left 10/14/2021   Procedure: FLEXIBLE BRONCHOSCOPY;  Surgeon: Salena Saner, MD;  Location: [facility];  Service: Pulmonary;  Laterality: Left;   IR ANGIOGRAM SELECTIVE EACH ADDITIONAL VESSEL  07/26/2017   IR ANGIOGRAM SELECTIVE EACH ADDITIONAL VESSEL  07/26/2017   IR ANGIOGRAM SELECTIVE EACH ADDITIONAL VESSEL  07/26/2017   IR ANGIOGRAM SELECTIVE EACH ADDITIONAL VESSEL  07/26/2017   IR ANGIOGRAM SELECTIVE EACH ADDITIONAL VESSEL  07/26/2017   IR ANGIOGRAM SELECTIVE EACH ADDITIONAL VESSEL  08/11/2017   IR ANGIOGRAM SELECTIVE EACH ADDITIONAL VESSEL  04/28/2018   IR ANGIOGRAM VISCERAL SELECTIVE  07/26/2017   IR ANGIOGRAM VISCERAL SELECTIVE  08/11/2017   IR ANGIOGRAM VISCERAL SELECTIVE  04/28/2018    IR EMBO ARTERIAL NOT HEMORR HEMANG INC GUIDE ROADMAPPING  07/26/2017   IR EMBO TUMOR ORGAN ISCHEMIA INFARCT INC GUIDE ROADMAPPING  08/11/2017   IR EMBO TUMOR ORGAN ISCHEMIA INFARCT INC GUIDE ROADMAPPING  04/28/2018   IR RADIOLOGIST EVAL & MGMT  07/07/2017   IR RADIOLOGIST EVAL & MGMT  09/07/2017   IR RADIOLOGIST EVAL & MGMT  12/21/2017   IR RADIOLOGIST EVAL & MGMT  03/22/2018   IR RADIOLOGIST EVAL & MGMT  06/08/2018   IR RADIOLOGIST EVAL & MGMT  11/01/2018   IR RADIOLOGIST EVAL & MGMT  03/21/2019   IR RADIOLOGIST EVAL & MGMT  11/08/2019   IR RADIOLOGIST EVAL & MGMT  07/01/2020   IR RADIOLOGIST EVAL & MGMT  12/18/2020   IR RADIOLOGIST EVAL & MGMT  06/18/2021   IR RADIOLOGIST EVAL & MGMT  12/14/2021   IR RADIOLOGIST EVAL & MGMT  12/31/2021   IR US GUIDE VASC ACCESS RIGHT  07/26/2017   IR US GUIDE VASC ACCESS RIGHT  08/11/2017   IR US GUIDE VASC ACCESS RIGHT  04/28/2018   LUNG LOBECTOMY  06/10/13   upper left lung   MULTIPLE TOOTH EXTRACTIONS     REMOVAL RETAINED LENS Right 11/17/2015   Procedure: REMOVAL RETAINED LENS FROAGMENTS RIGHT EYE;  Surgeon: Lockie Mola, MD;  Location: [facility];  Service: Ophthalmology;  Laterality: Right;   TONSILLECTOMY     UPPER GASTROINTESTINAL ENDOSCOPY  11-03-15   Dr Lemar Livings   VASECTOMY     VIDEO ASSISTED THORACOSCOPY (VATS)/WEDGE RESECTION Left 06/04/2013   Procedure: VIDEO ASSISTED THORACOSCOPY (VATS)/WEDGE RESECTION;  Surgeon: Delight Ovens, MD;  Location: [facility] OR;  Service: Thoracic;  Laterality: Left;   VIDEO BRONCHOSCOPY N/A 06/04/2013   Procedure: VIDEO BRONCHOSCOPY;  Surgeon: Delight Ovens, MD;  Location: [facility] OR;  Service: Thoracic;  Laterality: N/A;   VIDEO BRONCHOSCOPY Left 08/12/2021   Procedure: VIDEO BRONCHOSCOPY WITHOUT FLUORO;  Surgeon: Salena Saner, MD;  Location: [facility];  Service: Cardiopulmonary;  Laterality: Left;   VIDEO BRONCHOSCOPY Left 09/09/2021   Procedure: VIDEO BRONCHOSCOPY WITHOUT FLUORO;  Surgeon: Salena Saner, MD;   Location: [facility];  Service: Cardiopulmonary;  Laterality: Left;    Family History  Problem Relation Age of Onset   Stroke Mother    Alzheimer's disease Mother    Hypertension Sister    Hyperlipidemia Sister    Hypertension Brother    Hyperlipidemia Brother    Diabetes Brother     Social History   Socioeconomic History   Marital status: Widowed    Spouse name: Talbert Forest   Number of children: 3   Years of education: Not on file   Highest education level: High school graduate  Occupational History   Not on file  Tobacco Use   Smoking status: Former    Packs/day: 1.00    Years: 40.00    Additional pack years: 0.00    Total pack years: 40.00    Types: Cigarettes    Quit date: 12/09/1998    Years since quitting: 23.7   Smokeless tobacco: Never  Vaping Use   Vaping Use: Never used  Substance and Sexual Activity   Alcohol use: No   Drug use: No   Sexual activity: Yes    Partners: Female  Other Topics Concern   Not on file  Social History Narrative   Not on file   Social Determinants of Health   Financial Resource Strain: Low Risk  (06/12/2018)   Overall Financial Resource Strain (CARDIA)    Difficulty of Paying Living Expenses: Not hard at all  Food Insecurity: No Food Insecurity (06/12/2018)   Hunger Vital Sign    Worried About Running Out of Food in the Last Year: Never true    Ran Out of Food in the Last Year: Never true  Transportation Needs: No Transportation Needs (06/12/2018)   PRAPARE - Administrator, Civil Service (Medical): No    Lack of Transportation (Non-Medical): No  Physical Activity: Sufficiently Active (06/12/2018)   Exercise Vital Sign    Days of Exercise per Week: 7 days    Minutes of Exercise per Session: 60 min  Stress: No Stress Concern Present (06/12/2018)   [employer] of Occupational Health - Occupational Stress Questionnaire    Feeling of Stress : Not at all  Social Connections: Somewhat Isolated (06/12/2018)   Social  Connection and Isolation Panel [NHANES]    Frequency of Communication with Friends and Family: More than three times a week    Frequency of Social Gatherings with Friends and Family: More than  three times a week    Attends Religious Services: Never    Active Member of Clubs or Organizations: No    Attends Banker Meetings: Never    Marital Status: Married  Catering manager Violence: Not At Risk (06/12/2018)   Humiliation, Afraid, Rape, and Kick questionnaire    Fear of Current or Ex-Partner: No    Emotionally Abused: No    Physically Abused: No    Sexually Abused: No    Outpatient Medications Prior to Visit  Medication Sig Dispense Refill   aspirin EC 81 MG tablet Take 81 mg by mouth daily.     atorvastatin (LIPITOR) 40 MG tablet Take 1 tablet (40 mg total) by mouth daily. for cholesterol. 90 tablet 1   b complex vitamins capsule Take 1 capsule by mouth daily.     cholecalciferol (VITAMIN D3) 25 MCG (1000 UNIT) tablet Take 1,000 Units by mouth daily.     Cinnamon 500 MG capsule Take 500 mg by mouth at bedtime.     DM-APAP-CPM (CORICIDIN HBP PO) Take 2 tablets by mouth daily as needed (allergies/cold symptoms).     glucose blood (ONETOUCH VERIO) test strip USE 1 STRIP TO CHECK GLUCOSE ONCE DAILY 100 each 2   ipratropium (ATROVENT) 0.03 % nasal spray USE 2 SPRAY(S) IN EACH NOSTRIL THREE TIMES DAILY 30 mL 0   losartan (COZAAR) 100 MG tablet Take 1 tablet (100 mg total) by mouth every morning. 90 tablet 1   Multiple Vitamins-Minerals (MULTIVITAMIN WITH MINERALS) tablet Take 1 tablet by mouth daily.     ondansetron (ZOFRAN) 8 MG tablet Take 1 tablet (8 mg total) by mouth 2 (two) times daily as needed for nausea or vomiting. 20 tablet 0   polycarbophil (FIBERCON) 625 MG tablet Take 625 mg by mouth 2 (two) times daily.     polyvinyl alcohol (LIQUIFILM TEARS) 1.4 % ophthalmic solution Place 1 drop into both eyes as needed for dry eyes.     vitamin C (ASCORBIC ACID) 500 MG tablet Take  500 mg by mouth daily.     zinc sulfate 220 (50 Zn) MG capsule Take 1 capsule (220 mg total) by mouth daily. 30 capsule 0   metFORMIN (GLUCOPHAGE) 500 MG tablet Take 1 tablet (500 mg total) by mouth daily with breakfast. (Patient not taking: Reported on 08/02/2022) 90 tablet 1   diphenhydrAMINE (BENADRYL) 25 mg capsule Take 25 mg by mouth every morning. (Patient not taking: Reported on 08/02/2022)     Facility-Administered Medications Prior to Visit  Medication Dose Route Frequency Provider Last Rate Last Admin   lanreotide acetate (SOMATULINE DEPOT) injection 120 mg  120 mg Subcutaneous Once Jeralyn Ruths, MD        Allergies  Allergen Reactions   Tape Itching    Surgical tapes     ROS Review of Systems  Constitutional:  Negative for chills and fever.  Eyes:  Negative for visual disturbance.  Respiratory:  Negative for cough, shortness of breath and wheezing.   Cardiovascular:  Negative for chest pain.  Neurological:  Negative for dizziness.      Objective:    Physical Exam Constitutional:      Appearance: Normal appearance.  HENT:     Head: Normocephalic and atraumatic.     Mouth/Throat:     Comments: PND present Eyes:     Conjunctiva/sclera: Conjunctivae normal.  Cardiovascular:     Rate and Rhythm: Normal rate and regular rhythm.  Pulmonary:     Effort: Pulmonary  effort is normal.     Breath sounds: Normal breath sounds.  Skin:    General: Skin is warm and dry.  Neurological:     General: No focal deficit present.     Mental Status: He is alert. Mental status is at baseline.  Psychiatric:        Mood and Affect: Mood normal.        Behavior: Behavior normal.     BP 122/64   Pulse 63   Temp 97.8 F (36.6 C)   Resp 16   Ht 5\' 4"  (1.626 m)   Wt 159 lb 6.4 oz (72.3 kg)   SpO2 97%   BMI 27.36 kg/m  Wt Readings from Last 3 Encounters:  09/03/22 159 lb 6.4 oz (72.3 kg)  08/02/22 159 lb 6.4 oz (72.3 kg)  07/01/22 159 lb 1.6 oz (72.2 kg)      Health Maintenance Due  Topic Date Due   Medicare Annual Wellness (AWV)  Never done    There are no preventive care reminders to display for this patient.  Lab Results  Component Value Date   TSH 4.221 06/08/2013   Lab Results  Component Value Date   WBC 7.6 08/04/2022   HGB 16.0 08/04/2022   HCT 48.7 08/04/2022   MCV 93.7 08/04/2022   PLT 146 (L) 08/04/2022   Lab Results  Component Value Date   NA 140 08/04/2022   K 4.0 08/04/2022   CO2 26 08/04/2022   GLUCOSE 140 (H) 08/04/2022   BUN 21 08/04/2022   CREATININE 1.01 08/04/2022   BILITOT 0.8 08/04/2022   ALKPHOS 119 08/04/2022   AST 34 08/04/2022   ALT 36 08/04/2022   PROT 6.6 08/04/2022   ALBUMIN 4.2 08/04/2022   CALCIUM 9.2 08/04/2022   ANIONGAP 8 08/04/2022   Lab Results  Component Value Date   CHOL 115 12/28/2021   Lab Results  Component Value Date   HDL 41 12/28/2021   Lab Results  Component Value Date   LDLCALC 54 12/28/2021   Lab Results  Component Value Date   TRIG 113 12/28/2021   Lab Results  Component Value Date   CHOLHDL 2.8 12/28/2021   Lab Results  Component Value Date   HGBA1C 7.0 (A) 07/01/2022      Assessment & Plan:   1. Essential hypertension: Blood pressure well controlled here, continue Losartan 100 mg, add back Amlodipine 5 mg in the morning and 5 mg in the afternoon if BP >150/90. Follow up scheduled in August, will call if he needs anything.  - amLODipine (NORVASC) 5 MG tablet; Take 5 mg in the morning and take another 5 mg in the afternoon as needed if BP >150/100  Dispense: 90 tablet; Refill: 1  2. Grief: Still working through losing his wife Talbert Forest in December. Today is their 60th wedding anniversary. He is active and has good family support. Not interested in grief counseling or medication today, will continue to monitor.   3. Type 2 diabetes mellitus without complication, without long-term current use of insulin (HCC): Discontinued Metformin due to side  effects, continue to monitor sugar and diet. Plan to recheck at follow up.    Follow-up: Return for already scheduled .    Margarita Mail, DO

## 2022-09-03 ENCOUNTER — Ambulatory Visit (INDEPENDENT_AMBULATORY_CARE_PROVIDER_SITE_OTHER): Payer: Medicare Other | Admitting: Internal Medicine

## 2022-09-03 ENCOUNTER — Encounter: Payer: Self-pay | Admitting: Internal Medicine

## 2022-09-03 VITALS — BP 122/64 | HR 63 | Temp 97.8°F | Resp 16 | Ht 64.0 in | Wt 159.4 lb

## 2022-09-03 DIAGNOSIS — E119 Type 2 diabetes mellitus without complications: Secondary | ICD-10-CM | POA: Diagnosis not present

## 2022-09-03 DIAGNOSIS — F4321 Adjustment disorder with depressed mood: Secondary | ICD-10-CM

## 2022-09-03 DIAGNOSIS — I10 Essential (primary) hypertension: Secondary | ICD-10-CM | POA: Diagnosis not present

## 2022-09-03 MED ORDER — AMLODIPINE BESYLATE 5 MG PO TABS: ORAL_TABLET | ORAL | 1 refills | Status: DC

## 2022-09-03 NOTE — Patient Instructions (Addendum)
It was great seeing you today!  Plan discussed at today's visit: -Continue Losartan 100 mg daily, add back Amlodipine 5 mg in the morning and can take a second in the afternoon if BP >150/90 -Continue to check blood sugar, let me know if it starts to increase  Follow up in: August or sooner as needed  Take care and let us know if you have any questions or concerns prior to your next visit.  Dr. Caralee Ates

## 2022-09-20 ENCOUNTER — Encounter: Payer: Self-pay | Admitting: Internal Medicine

## 2022-09-20 ENCOUNTER — Other Ambulatory Visit: Payer: Self-pay | Admitting: Pulmonary Disease

## 2022-09-30 ENCOUNTER — Encounter (HOSPITAL_COMMUNITY)
Admission: RE | Admit: 2022-09-30 | Discharge: 2022-09-30 | Disposition: A | Discharge status: Home / Self Care | Payer: Medicare Other | Source: Ambulatory Visit | Attending: Interventional Radiology | Admitting: Interventional Radiology

## 2022-09-30 DIAGNOSIS — C7A8 Other malignant neuroendocrine tumors: Secondary | ICD-10-CM | POA: Insufficient documentation

## 2022-09-30 DIAGNOSIS — C7B8 Other secondary neuroendocrine tumors: Secondary | ICD-10-CM | POA: Insufficient documentation

## 2022-09-30 DIAGNOSIS — C7A1 Malignant poorly differentiated neuroendocrine tumors: Secondary | ICD-10-CM | POA: Diagnosis not present

## 2022-09-30 MED ORDER — COPPER CU 64 DOTATATE 1 MCI/ML IV SOLN
4.0000 | Freq: Once | INTRAVENOUS | Status: AC
  Administered 2022-09-30: 4.1 via INTRAVENOUS

## 2022-10-11 ENCOUNTER — Other Ambulatory Visit: Payer: Self-pay | Admitting: Interventional Radiology

## 2022-10-11 ENCOUNTER — Ambulatory Visit
Admission: RE | Admit: 2022-10-11 | Discharge: 2022-10-11 | Disposition: A | Discharge status: Home / Self Care | Payer: Medicare Other | Source: Ambulatory Visit | Attending: Interventional Radiology | Admitting: Interventional Radiology

## 2022-10-11 DIAGNOSIS — C7A1 Malignant poorly differentiated neuroendocrine tumors: Secondary | ICD-10-CM | POA: Diagnosis not present

## 2022-10-11 DIAGNOSIS — C7B8 Other secondary neuroendocrine tumors: Secondary | ICD-10-CM

## 2022-10-11 NOTE — Progress Notes (Signed)
Patient ID: Jason Malone, male   DOB: 11/16/1940, 82 y.o.   MRN: [id number]       Chief Complaint:  Metastatic neuroendocrine tumor to the liver   Referring Physician(s): Finnegan  History of Present Illness: Jason Malone is a 82 y.o. male   is a 82 y.o. 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  He is approximately 4 years status post whole liver Y90 radioembolization.  He is closely followed as an outpatient with surveillance imaging.  He continues to do very well physically with an excellent functional status.   He is now s/p 4 Lutathera treatments and has had a f/u Dotatate PET.  PET shows little change to no change in low level PET avid hepatic lesions and min inc'd activity in known bone mets.  There is also a new hepatic lesion in the rt lobe by non contrast PET CT which is not PET avid.  Last Chromogranin A is elevated at 1213 (06/09/22).  Last sandostatin injection was 4/24 according to the pt.     Past Medical History:  Diagnosis Date   Allergic rhinitis    Aortic atherosclerosis (HCC)    Benign prostatic hypertrophy with lower urinary tract symptoms (LUTS)    Bradycardia    Chest pain    a. 05/2013 MV: EF 70%, no ischemia.   Chronic respiratory failure with hypoxia (HCC)    COPD (chronic obstructive pulmonary disease) (HCC)    Coronary artery disease    COVID-19    Diabetes mellitus without complication (HCC)    last a1c 6.5 09-16-21 no meds   Diverticulitis    DIVERTICULOSIS   Dysrhythmia    Elevated LFTs    Enlarged prostate    Essential hypertension    CONTROLLED ON MEDS   Full dentures    H/O hypokalemia    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 08/28/2016   Hyperlipemia    Hypomagnesemia    IFG (impaired fasting glucose)    Liver cancer (HCC) 12/08/2016   Liver mass, left lobe 08/28/2016   Probably hemangioma; will get MRI of liver, refer to GI   Lung cancer (HCC)    a. carcinoid, left lung, Stage 1b (T2a, N0,  cM0);  b. 05/2013 s/p VATS & LULobectomy.   Metabolic acidosis    Monocytosis    Osteopenia    Paroxysmal atrial flutter (HCC)    a. 03/2013->no recurrence;  b. CHA2DS2VASc = 2-->not currently on anticoagulation;  c. 05/2012 Echo: EF 55-60%, normal RV.   Pneumonia    Postinflammatory pulmonary fibrosis (HCC)    Rectal bleeding    Smoking history    Wears glasses     Past Surgical History:  Procedure Laterality Date   BRONCHOSCOPY     04/06/2013   CARDIOVASCULAR STRESS TEST  05/2013   a. No evidence of ischemia or infarct, EF 70%, no WMAs   CATARACT EXTRACTION W/PHACO Left 07/14/2015   Procedure: CATARACT EXTRACTION PHACO AND INTRAOCULAR LENS PLACEMENT (IOC);  Surgeon: Lockie Mola, MD;  Location: [facility];  Service: Ophthalmology;  Laterality: Left;   CATARACT EXTRACTION W/PHACO Right 10/01/2015   Procedure: CATARACT EXTRACTION PHACO AND INTRAOCULAR LENS PLACEMENT (IOC);  Surgeon: Lockie Mola, MD;  Location: [facility];  Service: Ophthalmology;  Laterality: Right;   COLONOSCOPY W/ POLYPECTOMY     bleed after-had to go to surgery to stop bleeding via colonoscopy   COLONOSCOPY WITH PROPOFOL N/A 11/19/2015   Procedure: COLONOSCOPY WITH  PROPOFOL;  Surgeon: [name], MD;  Location[facility];  Service: Endoscopy;  Laterality: N/A;   DUPUYTREN CONTRACTURE RELEASE  12/15/2011   Procedure: DUPUYTREN CONTRACTURE RELEASE;  Surgeon: Nicki Reaper, MD;  Location: [facility];  Service: Orthopedics;  Laterality: Left;  Fasciotomy left ring finger dupuytrens   ELECTROMAGNETIC NAVIGATION BROCHOSCOPY Left 09/19/2019   Procedure: ELECTROMAGNETIC NAVIGATION BRONCHOSCOPY;  Surgeon: Salena Saner, MD;  Location: [facility];  Service: Cardiopulmonary;  Laterality: Left;   FASCIECTOMY Right 11/07/2018   Procedure: SEGMENTAL FASCIECTOMY RIGHT RING FINGER;  Surgeon: Cindee Salt, MD;  Location: [facility];  Service: Orthopedics;  Laterality:  Right;  AXILLARY BLOCK   FLEXIBLE BRONCHOSCOPY Left 11/26/2019   Procedure: FLEXIBLE BRONCHOSCOPY WITH CRYO THERAPY;  Surgeon: Salena Saner, MD;  Location: [facility];  Service: Cardiopulmonary;  Laterality: Left;   FLEXIBLE BRONCHOSCOPY Left 10/14/2021   Procedure: FLEXIBLE BRONCHOSCOPY;  Surgeon: Salena Saner, MD;  Location: [facility];  Service: Pulmonary;  Laterality: Left;   IR ANGIOGRAM SELECTIVE EACH ADDITIONAL VESSEL  07/26/2017   IR ANGIOGRAM SELECTIVE EACH ADDITIONAL VESSEL  07/26/2017   IR ANGIOGRAM SELECTIVE EACH ADDITIONAL VESSEL  07/26/2017   IR ANGIOGRAM SELECTIVE EACH ADDITIONAL VESSEL  07/26/2017   IR ANGIOGRAM SELECTIVE EACH ADDITIONAL VESSEL  07/26/2017   IR ANGIOGRAM SELECTIVE EACH ADDITIONAL VESSEL  08/11/2017   IR ANGIOGRAM SELECTIVE EACH ADDITIONAL VESSEL  04/28/2018   IR ANGIOGRAM VISCERAL SELECTIVE  07/26/2017   IR ANGIOGRAM VISCERAL SELECTIVE  08/11/2017   IR ANGIOGRAM VISCERAL SELECTIVE  04/28/2018   IR EMBO ARTERIAL NOT HEMORR HEMANG INC GUIDE ROADMAPPING  07/26/2017   IR EMBO TUMOR ORGAN ISCHEMIA INFARCT INC GUIDE ROADMAPPING  08/11/2017   IR EMBO TUMOR ORGAN ISCHEMIA INFARCT INC GUIDE ROADMAPPING  04/28/2018   IR RADIOLOGIST EVAL & MGMT  07/07/2017   IR RADIOLOGIST EVAL & MGMT  09/07/2017   IR RADIOLOGIST EVAL & MGMT  12/21/2017   IR RADIOLOGIST EVAL & MGMT  03/22/2018   IR RADIOLOGIST EVAL & MGMT  06/08/2018   IR RADIOLOGIST EVAL & MGMT  11/01/2018   IR RADIOLOGIST EVAL & MGMT  03/21/2019   IR RADIOLOGIST EVAL & MGMT  11/08/2019   IR RADIOLOGIST EVAL & MGMT  07/01/2020   IR RADIOLOGIST EVAL & MGMT  12/18/2020   IR RADIOLOGIST EVAL & MGMT  06/18/2021   IR RADIOLOGIST EVAL & MGMT  12/14/2021   IR RADIOLOGIST EVAL & MGMT  12/31/2021   IR US GUIDE VASC ACCESS RIGHT  07/26/2017   IR US GUIDE VASC ACCESS RIGHT  08/11/2017   IR US GUIDE VASC ACCESS RIGHT  04/28/2018   LUNG LOBECTOMY  06/10/13   upper left lung   MULTIPLE TOOTH EXTRACTIONS     REMOVAL RETAINED LENS Right 11/17/2015    Procedure: REMOVAL RETAINED LENS FROAGMENTS RIGHT EYE;  Surgeon: Lockie Mola, MD;  Location: [facility];  Service: Ophthalmology;  Laterality: Right;   TONSILLECTOMY     UPPER GASTROINTESTINAL ENDOSCOPY  11-03-15   Dr Lemar Livings   VASECTOMY     VIDEO ASSISTED THORACOSCOPY (VATS)/WEDGE RESECTION Left 06/04/2013   Procedure: VIDEO ASSISTED THORACOSCOPY (VATS)/WEDGE RESECTION;  Surgeon: Delight Ovens, MD;  Location: [facility] OR;  Service: Thoracic;  Laterality: Left;   VIDEO BRONCHOSCOPY N/A 06/04/2013   Procedure: VIDEO BRONCHOSCOPY;  Surgeon: Delight Ovens, MD;  Location: [facility];  Service: Thoracic;  Laterality: N/A;   VIDEO BRONCHOSCOPY Left 08/12/2021   Procedure: VIDEO BRONCHOSCOPY WITHOUT FLUORO;  Surgeon: Salena Saner, MD;  Location: [facility];  Service: Cardiopulmonary;  Laterality: Left;   VIDEO BRONCHOSCOPY Left 09/09/2021   Procedure: VIDEO BRONCHOSCOPY WITHOUT FLUORO;  Surgeon: Salena Saner, MD;  Location: [facility];  Service: Cardiopulmonary;  Laterality: Left;    Allergies: Tape  Medications: Prior to Admission medications   Medication Sig Start Date End Date Taking? Authorizing Provider  amLODipine (NORVASC) 5 MG tablet Take 5 mg in the morning and take another 5 mg in the afternoon as needed if BP >150/100 09/03/22   Margarita Mail, DO  aspirin EC 81 MG tablet Take 81 mg by mouth daily.    [provider]  atorvastatin (LIPITOR) 40 MG tablet Take 1 tablet (40 mg total) by mouth daily. for cholesterol. 07/01/22   Margarita Mail, DO  b complex vitamins capsule Take 1 capsule by mouth daily.    [provider]  cholecalciferol (VITAMIN D3) 25 MCG (1000 UNIT) tablet Take 1,000 Units by mouth daily.    [provider]  Cinnamon 500 MG capsule Take 500 mg by mouth at bedtime.    [provider]  DM-APAP-CPM (CORICIDIN HBP PO) Take 2 tablets by mouth daily as needed (allergies/cold symptoms).    [provider]   glucose blood (ONETOUCH VERIO) test strip USE 1 STRIP TO CHECK GLUCOSE ONCE DAILY 08/24/22   Margarita Mail, DO  ipratropium (ATROVENT) 0.03 % nasal spray USE 2 SPRAY(S) IN [facility] NOSTRIL THREE TIMES DAILY 09/20/22   Salena Saner, MD  losartan (COZAAR) 100 MG tablet Take 1 tablet (100 mg total) by mouth every morning. 07/01/22   Margarita Mail, DO  Multiple Vitamins-Minerals (MULTIVITAMIN WITH MINERALS) tablet Take 1 tablet by mouth daily.    [provider]  ondansetron (ZOFRAN) 8 MG tablet Take 1 tablet (8 mg total) by mouth 2 (two) times daily as needed for nausea or vomiting. 06/09/22   Lorna Few, MD  polycarbophil (FIBERCON) 625 MG tablet Take 625 mg by mouth 2 (two) times daily.    [provider]  polyvinyl alcohol (LIQUIFILM TEARS) 1.4 % ophthalmic solution Place 1 drop into both eyes as needed for dry eyes.    [provider]  vitamin C (ASCORBIC ACID) 500 MG tablet Take 500 mg by mouth daily.    [provider]  zinc sulfate 220 (50 Zn) MG capsule Take 1 capsule (220 mg total) by mouth daily. 06/18/19   Drema Dallas, MD     Family History  Problem Relation Age of Onset   Stroke Mother    Alzheimer's disease Mother    Hypertension Sister    Hyperlipidemia Sister    Hypertension Brother    Hyperlipidemia Brother    Diabetes Brother     Social History   Socioeconomic History   Marital status: Widowed    Spouse name: Talbert Forest   Number of children: 3   Years of education: Not on file   Highest education level: High school graduate  Occupational History   Not on file  Tobacco Use   Smoking status: Former    Packs/day: 1.00    Years: 40.00    Additional pack years: 0.00    Total pack years: 40.00    Types: Cigarettes    Quit date: 12/09/1998    Years since quitting: 23.8   Smokeless tobacco: Never  Vaping Use   Vaping Use: Never used  Substance and Sexual Activity   Alcohol use: No   Drug use: No  Sexual activity: Yes     Partners: Female  Other Topics Concern   Not on file  Social History Narrative   Not on file   Social Determinants of Health   Financial Resource Strain: Low Risk  (06/12/2018)   Overall Financial Resource Strain (CARDIA)    Difficulty of Paying Living Expenses: Not hard at all  Food Insecurity: No Food Insecurity (06/12/2018)   Hunger Vital Sign    Worried About Running Out of Food in the Last Year: Never true    Ran Out of Food in the Last Year: Never true  Transportation Needs: No Transportation Needs (06/12/2018)   PRAPARE - Administrator, Civil Service (Medical): No    Lack of Transportation (Non-Medical): No  Physical Activity: Sufficiently Active (06/12/2018)   Exercise Vital Sign    Days of Exercise per Week: 7 days    Minutes of Exercise per Session: 60 min  Stress: No Stress Concern Present (06/12/2018)   [employer] of Occupational Health - Occupational Stress Questionnaire    Feeling of Stress : Not at all  Social Connections: Somewhat Isolated (06/12/2018)   Social Connection and Isolation Panel [NHANES]    Frequency of Communication with Friends and Family: More than three times a week    Frequency of Social Gatherings with Friends and Family: More than three times a week    Attends Religious Services: Never    Database administrator or Organizations: No    Attends Engineer, structural: Never    Marital Status: Married    ECOG Status: 2 - Symptomatic, <50% confined to bed  Review of Systems  Review of Systems: A 12 point ROS discussed and pertinent positives are indicated in the HPI above.  All other systems are negative.      Physical Exam No direct physical exam was performed   Tele health visit only to review recent imaigng   Vital Signs: There were no vitals taken for this visit.  Imaging: NM PET DOTATATE SKULL BASE TO MID THIGH  Result Date: 09/30/2022 CLINICAL DATA:  Subsequent treatment strategy for well differentiated  neuroendocrine tumor of the lung with liver metastases. EXAM: NUCLEAR MEDICINE PET SKULL BASE TO THIGH TECHNIQUE: 10.10 mCi Cu 64 DOTATATE was injected intravenously. Full-ring PET imaging was performed from the skull base to thigh after the radiotracer. CT data was obtained and used for attenuation correction and anatomic localization. COMPARISON:  08/28/2021 FINDINGS: NECK No radiotracer activity in neck lymph nodes. Incidental CT findings: None CHEST No radiotracer accumulation within mediastinal or hilar lymph nodes. No suspicious pulmonary nodules on the CT scan. Incidental CT finding:Status post left upper lobectomy. Emphysema. Aortic atherosclerosis and coronary artery calcifications. ABDOMEN/PELVIS -Partially exophytic lesion arising off the inferior and medial margin of segment 2 measures approximately 2.3 cm with SUV max of 6.08, image 104. Previously this measured 2.2 cm with SUV max of 9.03. -Lesion in the posterior right lobe of liver measures 2.6 cm and has an SUV max of 9.04, image 106/4. Previously 1.7 cm with SUV max of 7.0. Has an SUV max of 9.04. No tracer avid abdominopelvic lymph nodes. Physiologic activity noted in the liver, spleen, adrenal glands and kidneys. Incidental CT findings:Calcification within the bladder measures 1 cm. Prostate gland appears enlarged with diffuse increased uptake. This appears similar to the previous exam. SKELETON Signs of multifocal bone metastases noted, including: -previous tracer avid lesion within the proximal humeral diaphysis has an SUV max of 6.83. This is  compared with 5.15 previously. No corresponding lytic or sclerotic lesion noted on the CT images. -previous tracer avid lesion within the distal humerus has an SUV max of 3.60. Previously 3.18. No corresponding lytic or sclerotic changes identified on the CT images. -There is focal asymmetric increased uptake within the right iliac bone along the superior margin of the acetabulum with SUV max of 5.36. On  the previous exam the SUV max was equal to 4.22. -There is a sclerotic lesion in the left side of the T11 vertebral body measuring 1.4 cm with SUV max of 3.48. Previously 1.4 cm with SUV max of 2.17. -Sclerotic lesion involving the right-side of the T2 vertebra measures 0.8 cm with SUV max of 3.18, image 58/2. Previously this measured 0.8 cm with SUV max of 1.96. Incidental CT findings:None IMPRESSION: 1. Metastasis within the posterior right lobe of liver is mildly increased in size and degree of tracer uptake. Lesion arising off the lateral segment of left hepatic lobe is stable in size with mildly decreased tracer uptake compared with the previous exam. 2. Multifocal tracer avid bone metastases are again noted. There has been mild increase in SUV max associated with the lesions within the proximal left humerus, T11 vertebral body, and right iliac bone. 3.  Aortic Atherosclerosis (ICD10-I70.0). Electronically Signed   By: Signa Kell M.D.   On: 09/30/2022 12:04    Labs:  CBC: Recent Labs    02/17/22 0940 04/14/22 0845 06/09/22 0821 08/04/22 0833  WBC 12.3* 10.6* 8.4 7.6  HGB 15.4 15.8 16.1 16.0  HCT 47.8 48.3 49.4 48.7  PLT 170 188 153 146*    COAGS: No results for input(s): "INR", "APTT" in the last 8760 hours.  BMP: Recent Labs    02/17/22 0940 04/14/22 0845 06/09/22 0821 08/04/22 0833  NA 139 141 140 140  K 4.0 3.8 3.8 4.0  CL 107 104 104 106  CO2 25 27 25 26   GLUCOSE 157* 168* 146* 140*  BUN 20 18 18 21   CALCIUM 9.7 9.7 9.7 9.2  CREATININE 0.94 0.97 0.73 1.01  GFRNONAA >60 >60 >60 >60    LIVER FUNCTION TESTS: Recent Labs    02/17/22 0940 04/14/22 0845 06/09/22 0821 08/04/22 0833  BILITOT 0.7 1.0 0.9 0.8  AST 31 20 29  34  ALT 31 18 23  36  ALKPHOS 148* 134* 131* 119  PROT 7.3 7.2 7.7 6.6  ALBUMIN 4.1 3.9 4.4 4.2       Assessment and Plan:  Met neuroendocrine tumor to the liver.  S/p whole liver Y90 over 4 years ago.  Most recently s/p 4 lutathera  treatments as well.  Last sandostatin injection 4/24 according to him.  F/u PET Dotatate shows little change in met disease.  Suspect new non PET avid lesion in the Rt liver also.  Rec update MRI abd to reassess Liver tumor burden and consider additional Y90 to the right liver.  Needs f/u outpt visit with Dr Orlie Dakin soon and rec continue with monthly sandostatin injections  He is in agreement with this plan.     Electronically Signed: Berdine Dance 10/11/2022, 10:54 AM   I spent a total of    40 Minutes in remote  clinical consultation, greater than 50% of which was counseling/coordinating care for this pt with met neuroendocrine tumor.    Visit type: Audio only (telephone). Audio (no video) only due to patient's lack of internet/smartphone capability. Alternative for in-person consultation at [facility], [address] [place], [place], [place]. This  visit type was conducted due to national recommendations for restrictions regarding the COVID-19 Pandemic (e.g. social distancing).  This format is felt to be most appropriate for this patient at this time.  All issues noted in this document were discussed and addressed.

## 2022-10-12 ENCOUNTER — Other Ambulatory Visit: Payer: Self-pay | Admitting: Interventional Radiology

## 2022-10-12 DIAGNOSIS — C7B8 Other secondary neuroendocrine tumors: Secondary | ICD-10-CM

## 2022-10-14 ENCOUNTER — Ambulatory Visit
Admission: RE | Admit: 2022-10-14 | Discharge: 2022-10-14 | Disposition: A | Discharge status: Home / Self Care | Payer: Medicare Other | Source: Ambulatory Visit | Attending: Interventional Radiology | Admitting: Interventional Radiology

## 2022-10-14 ENCOUNTER — Other Ambulatory Visit: Payer: Self-pay

## 2022-10-14 DIAGNOSIS — C7B8 Other secondary neuroendocrine tumors: Secondary | ICD-10-CM | POA: Insufficient documentation

## 2022-10-14 DIAGNOSIS — C7A1 Malignant poorly differentiated neuroendocrine tumors: Secondary | ICD-10-CM | POA: Diagnosis not present

## 2022-10-14 DIAGNOSIS — C7A8 Other malignant neuroendocrine tumors: Secondary | ICD-10-CM

## 2022-10-14 MED ORDER — GADOBUTROL 1 MMOL/ML IV SOLN
7.0000 mL | Freq: Once | INTRAVENOUS | Status: AC | PRN
  Administered 2022-10-14: 7 mL via INTRAVENOUS

## 2022-10-15 ENCOUNTER — Inpatient Hospital Stay: Payer: Medicare Other

## 2022-10-15 ENCOUNTER — Encounter: Payer: Self-pay | Admitting: Oncology

## 2022-10-15 ENCOUNTER — Inpatient Hospital Stay: Payer: Medicare Other | Attending: Oncology | Admitting: Oncology

## 2022-10-15 VITALS — BP 144/79 | HR 60 | Temp 97.0°F | Wt 158.4 lb

## 2022-10-15 DIAGNOSIS — C7A8 Other malignant neuroendocrine tumors: Secondary | ICD-10-CM

## 2022-10-15 DIAGNOSIS — C7B8 Other secondary neuroendocrine tumors: Secondary | ICD-10-CM

## 2022-10-15 DIAGNOSIS — Z79899 Other long term (current) drug therapy: Secondary | ICD-10-CM | POA: Diagnosis not present

## 2022-10-15 LAB — CBC WITH DIFFERENTIAL/PLATELET
Abs Immature Granulocytes: 0.03 10*3/uL (ref 0.00–0.07)
Basophils Absolute: 0.1 10*3/uL (ref 0.0–0.1)
Basophils Relative: 1 %
Eosinophils Absolute: 0.1 10*3/uL (ref 0.0–0.5)
Eosinophils Relative: 1 %
HCT: 45.4 % (ref 39.0–52.0)
Hemoglobin: 15 g/dL (ref 13.0–17.0)
Immature Granulocytes: 0 %
Lymphocytes Relative: 8 %
Lymphs Abs: 0.5 10*3/uL — ABNORMAL LOW (ref 0.7–4.0)
MCH: 30.4 pg (ref 26.0–34.0)
MCHC: 33 g/dL (ref 30.0–36.0)
MCV: 92.1 fL (ref 80.0–100.0)
Monocytes Absolute: 1 10*3/uL (ref 0.1–1.0)
Monocytes Relative: 15 %
Neutro Abs: 5.1 10*3/uL (ref 1.7–7.7)
Neutrophils Relative %: 75 %
Platelets: 177 10*3/uL (ref 150–400)
RBC: 4.93 MIL/uL (ref 4.22–5.81)
RDW: 14 % (ref 11.5–15.5)
WBC: 6.8 10*3/uL (ref 4.0–10.5)
nRBC: 0 % (ref 0.0–0.2)

## 2022-10-15 LAB — CMP (CANCER CENTER ONLY)
ALT: 20 U/L (ref 0–44)
AST: 25 U/L (ref 15–41)
Albumin: 4 g/dL (ref 3.5–5.0)
Alkaline Phosphatase: 139 U/L — ABNORMAL HIGH (ref 38–126)
Anion gap: 8 (ref 5–15)
BUN: 22 mg/dL (ref 8–23)
CO2: 25 mmol/L (ref 22–32)
Calcium: 9.5 mg/dL (ref 8.9–10.3)
Chloride: 109 mmol/L (ref 98–111)
Creatinine: 0.85 mg/dL (ref 0.61–1.24)
GFR, Estimated: >60 mL/min (ref >60)
Glucose, Bld: 119 mg/dL — ABNORMAL HIGH (ref 70–99)
Potassium: 4.2 mmol/L (ref 3.5–5.1)
Sodium: 142 mmol/L (ref 135–145)
Total Bilirubin: 1 mg/dL (ref 0.3–1.2)
Total Protein: 7 g/dL (ref 6.5–8.1)

## 2022-10-15 MED ORDER — LANREOTIDE ACETATE 120 MG/0.5ML ~~LOC~~ SOLN
120.0000 mg | Freq: Once | SUBCUTANEOUS | Status: AC
  Administered 2022-10-15: 120 mg via SUBCUTANEOUS
  Filled 2022-10-15: qty 120

## 2022-10-15 NOTE — Progress Notes (Signed)
Patient has been more tired more recently especially for the last three to four days, he has been sleeping more during the day.

## 2022-10-15 NOTE — Progress Notes (Signed)
[facility]  Telephone:(336) [phone] Fax:(336) [phone]  ID: Jason Malone OB: 1940/12/10  MR#: [id number]  QIO#:[id number]  Patient Care Team: Margarita Mail, DO as PCP - General (Internal Medicine) Antonieta Iba, MD as PCP - Cardiology (Cardiology) Byrnett, Merrily Pew, MD (General Surgery) Hulda Marin, MD (Inactive) as Referring Physician (Cardiothoracic Surgery) Antonieta Iba, MD as Consulting Physician (Cardiology) Delight Ovens, MD (Inactive) as Consulting Physician (Cardiothoracic Surgery) Jeralyn Ruths, MD as Consulting Physician (Oncology) Sherie Don Janit Bern, MD as Attending Physician (Family Medicine)   CHIEF COMPLAINT: 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  INTERVAL HISTORY: Patient returns to clinic today for further evaluation and continuation of lanreotide.  His wife passed away nearly 6 months ago and he continues to adjust, but is doing better.  He has completed his Lutathera treatments.  He currently feels well and is asymptomatic.  He does not complain of any weakness or fatigue. He denies any pain. He has no neurologic complaints.  He denies any recent fevers or illnesses.  He has a good appetite and denies weight loss.  He denies any chest pain, shortness of breath, or hemoptysis.  He denies any abdominal pain.  He has no nausea, vomiting, constipation, or diarrhea.  He has no urinary complaints.  Patient offers no further specific complaints today.  REVIEW OF SYSTEMS:   Review of Systems  Constitutional: Negative.  Negative for fever, malaise/fatigue and weight loss.  Respiratory: Negative.  Negative for cough, hemoptysis and shortness of breath.   Cardiovascular: Negative.  Negative for chest pain and leg swelling.  Gastrointestinal: Negative.  Negative for abdominal pain, diarrhea, nausea and vomiting.  Genitourinary: Negative.  Negative for dysuria.  Musculoskeletal: Negative.  Negative for back  pain.  Skin: Negative.  Negative for rash.  Neurological: Negative.  Negative for dizziness, sensory change, focal weakness and weakness.  Psychiatric/Behavioral: Negative.  The patient is not nervous/anxious.     As per HPI. Otherwise, a complete review of systems is negative.  PAST MEDICAL HISTORY: Past Medical History:  Diagnosis Date   Allergic rhinitis    Aortic atherosclerosis (HCC)    Benign prostatic hypertrophy with lower urinary tract symptoms (LUTS)    Bradycardia    Chest pain    a. 05/2013 MV: EF 70%, no ischemia.   Chronic respiratory failure with hypoxia (HCC)    COPD (chronic obstructive pulmonary disease) (HCC)    Coronary artery disease    COVID-19    Diabetes mellitus without complication (HCC)    last a1c 6.5 09-16-21 no meds   Diverticulitis    DIVERTICULOSIS   Dysrhythmia    Elevated LFTs    Enlarged prostate    Essential hypertension    CONTROLLED ON MEDS   Full dentures    H/O hypokalemia    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 08/28/2016   Hyperlipemia    Hypomagnesemia    IFG (impaired fasting glucose)    Liver cancer (HCC) 12/08/2016   Liver mass, left lobe 08/28/2016   Probably hemangioma; will get MRI of liver, refer to GI   Lung cancer (HCC)    a. carcinoid, left lung, Stage 1b (T2a, N0, cM0);  b. 05/2013 s/p VATS & LULobectomy.   Metabolic acidosis    Monocytosis    Osteopenia    Paroxysmal atrial flutter (HCC)    a. 03/2013->no recurrence;  b. CHA2DS2VASc = 2-->not currently on anticoagulation;  c. 05/2012 Echo: EF 55-60%,  normal RV.   Pneumonia    Postinflammatory pulmonary fibrosis (HCC)    Rectal bleeding    Smoking history    Wears glasses     PAST SURGICAL HISTORY: Past Surgical History:  Procedure Laterality Date   BRONCHOSCOPY     04/06/2013   CARDIOVASCULAR STRESS TEST  05/2013   a. No evidence of ischemia or infarct, EF 70%, no WMAs   CATARACT EXTRACTION W/PHACO Left 07/14/2015   Procedure: CATARACT  EXTRACTION PHACO AND INTRAOCULAR LENS PLACEMENT (IOC);  Surgeon: Lockie Mola, MD;  Location: [facility];  Service: Ophthalmology;  Laterality: Left;   CATARACT EXTRACTION W/PHACO Right 10/01/2015   Procedure: CATARACT EXTRACTION PHACO AND INTRAOCULAR LENS PLACEMENT (IOC);  Surgeon: Lockie Mola, MD;  Location: [facility];  Service: Ophthalmology;  Laterality: Right;   COLONOSCOPY W/ POLYPECTOMY     bleed after-had to go to surgery to stop bleeding via colonoscopy   COLONOSCOPY WITH PROPOFOL N/A 11/19/2015   Procedure: COLONOSCOPY WITH PROPOFOL;  Surgeon: [name], MD;  Location: [facility];  Service: Endoscopy;  Laterality: N/A;   DUPUYTREN CONTRACTURE RELEASE  12/15/2011   Procedure: DUPUYTREN CONTRACTURE RELEASE;  Surgeon: Nicki Reaper, MD;  Location: [facility];  Service: Orthopedics;  Laterality: Left;  Fasciotomy left ring finger dupuytrens   ELECTROMAGNETIC NAVIGATION BROCHOSCOPY Left 09/19/2019   Procedure: ELECTROMAGNETIC NAVIGATION BRONCHOSCOPY;  Surgeon: Salena Saner, MD;  Location: [facility];  Service: Cardiopulmonary;  Laterality: Left;   FASCIECTOMY Right 11/07/2018   Procedure: SEGMENTAL FASCIECTOMY RIGHT RING FINGER;  Surgeon: Cindee Salt, MD;  Location: [facility];  Service: Orthopedics;  Laterality: Right;  AXILLARY BLOCK   FLEXIBLE BRONCHOSCOPY Left 11/26/2019   Procedure: FLEXIBLE BRONCHOSCOPY WITH CRYO THERAPY;  Surgeon: Salena Saner, MD;  Location: [facility];  Service: Cardiopulmonary;  Laterality: Left;   FLEXIBLE BRONCHOSCOPY Left 10/14/2021   Procedure: FLEXIBLE BRONCHOSCOPY;  Surgeon: Salena Saner, MD;  Location: [facility];  Service: Pulmonary;  Laterality: Left;   IR ANGIOGRAM SELECTIVE EACH ADDITIONAL VESSEL  07/26/2017   IR ANGIOGRAM SELECTIVE EACH ADDITIONAL VESSEL  07/26/2017   IR ANGIOGRAM SELECTIVE EACH ADDITIONAL VESSEL  07/26/2017   IR ANGIOGRAM SELECTIVE EACH ADDITIONAL VESSEL   07/26/2017   IR ANGIOGRAM SELECTIVE EACH ADDITIONAL VESSEL  07/26/2017   IR ANGIOGRAM SELECTIVE EACH ADDITIONAL VESSEL  08/11/2017   IR ANGIOGRAM SELECTIVE EACH ADDITIONAL VESSEL  04/28/2018   IR ANGIOGRAM VISCERAL SELECTIVE  07/26/2017   IR ANGIOGRAM VISCERAL SELECTIVE  08/11/2017   IR ANGIOGRAM VISCERAL SELECTIVE  04/28/2018   IR EMBO ARTERIAL NOT HEMORR HEMANG INC GUIDE ROADMAPPING  07/26/2017   IR EMBO TUMOR ORGAN ISCHEMIA INFARCT INC GUIDE ROADMAPPING  08/11/2017   IR EMBO TUMOR ORGAN ISCHEMIA INFARCT INC GUIDE ROADMAPPING  04/28/2018   IR RADIOLOGIST EVAL & MGMT  07/07/2017   IR RADIOLOGIST EVAL & MGMT  09/07/2017   IR RADIOLOGIST EVAL & MGMT  12/21/2017   IR RADIOLOGIST EVAL & MGMT  03/22/2018   IR RADIOLOGIST EVAL & MGMT  06/08/2018   IR RADIOLOGIST EVAL & MGMT  11/01/2018   IR RADIOLOGIST EVAL & MGMT  03/21/2019   IR RADIOLOGIST EVAL & MGMT  11/08/2019   IR RADIOLOGIST EVAL & MGMT  07/01/2020   IR RADIOLOGIST EVAL & MGMT  12/18/2020   IR RADIOLOGIST EVAL & MGMT  06/18/2021   IR RADIOLOGIST EVAL & MGMT  12/14/2021   IR RADIOLOGIST EVAL & MGMT  12/31/2021   IR US GUIDE VASC  ACCESS RIGHT  07/26/2017   IR US GUIDE VASC ACCESS RIGHT  08/11/2017   IR US GUIDE VASC ACCESS RIGHT  04/28/2018   LUNG LOBECTOMY  06/10/13   upper left lung   MULTIPLE TOOTH EXTRACTIONS     REMOVAL RETAINED LENS Right 11/17/2015   Procedure: REMOVAL RETAINED LENS FROAGMENTS RIGHT EYE;  Surgeon: Lockie Mola, MD;  Location: [facility];  Service: Ophthalmology;  Laterality: Right;   TONSILLECTOMY     UPPER GASTROINTESTINAL ENDOSCOPY  11-03-15   Dr Lemar Livings   VASECTOMY     VIDEO ASSISTED THORACOSCOPY (VATS)/WEDGE RESECTION Left 06/04/2013   Procedure: VIDEO ASSISTED THORACOSCOPY (VATS)/WEDGE RESECTION;  Surgeon: Delight Ovens, MD;  Location: [facility];  Service: Thoracic;  Laterality: Left;   VIDEO BRONCHOSCOPY N/A 06/04/2013   Procedure: VIDEO BRONCHOSCOPY;  Surgeon: Delight Ovens, MD;  Location: [facility];  Service:  Thoracic;  Laterality: N/A;   VIDEO BRONCHOSCOPY Left 08/12/2021   Procedure: VIDEO BRONCHOSCOPY WITHOUT FLUORO;  Surgeon: Salena Saner, MD;  Location: [facility];  Service: Cardiopulmonary;  Laterality: Left;   VIDEO BRONCHOSCOPY Left 09/09/2021   Procedure: VIDEO BRONCHOSCOPY WITHOUT FLUORO;  Surgeon: Salena Saner, MD;  Location: [facility];  Service: Cardiopulmonary;  Laterality: Left;    FAMILY HISTORY: Family History  Problem Relation Age of Onset   Stroke Mother    Alzheimer's disease Mother    Hypertension Sister    Hyperlipidemia Sister    Hypertension Brother    Hyperlipidemia Brother    Diabetes Brother     ADVANCED DIRECTIVES (Y/N):  N  HEALTH MAINTENANCE: Social History   Tobacco Use   Smoking status: Former    Packs/day: 1.00    Years: 40.00    Additional pack years: 0.00    Total pack years: 40.00    Types: Cigarettes    Quit date: 12/09/1998    Years since quitting: 23.8   Smokeless tobacco: Never  Vaping Use   Vaping Use: Never used  Substance Use Topics   Alcohol use: No   Drug use: No     Colonoscopy:  PAP:  Bone density:  Lipid panel:  Allergies  Allergen Reactions   Tape Itching    Surgical tapes     Current Outpatient Medications  Medication Sig Dispense Refill   aspirin EC 81 MG tablet Take 81 mg by mouth daily.     atorvastatin (LIPITOR) 40 MG tablet Take 1 tablet (40 mg total) by mouth daily. for cholesterol. 90 tablet 1   b complex vitamins capsule Take 1 capsule by mouth daily.     cholecalciferol (VITAMIN D3) 25 MCG (1000 UNIT) tablet Take 1,000 Units by mouth daily.     Cinnamon 500 MG capsule Take 500 mg by mouth at bedtime.     DM-APAP-CPM (CORICIDIN HBP PO) Take 2 tablets by mouth daily as needed (allergies/cold symptoms).     glucose blood (ONETOUCH VERIO) test strip USE 1 STRIP TO CHECK GLUCOSE ONCE DAILY 100 each 2   ipratropium (ATROVENT) 0.03 % nasal spray USE 2 SPRAY(S) IN EACH NOSTRIL THREE TIMES DAILY 30 mL 0    losartan (COZAAR) 100 MG tablet Take 1 tablet (100 mg total) by mouth every morning. 90 tablet 1   Multiple Vitamins-Minerals (MULTIVITAMIN WITH MINERALS) tablet Take 1 tablet by mouth daily.     ondansetron (ZOFRAN) 8 MG tablet Take 1 tablet (8 mg total) by mouth 2 (two) times daily as needed for nausea or vomiting. 20 tablet 0  polycarbophil (FIBERCON) 625 MG tablet Take 625 mg by mouth 2 (two) times daily.     polyvinyl alcohol (LIQUIFILM TEARS) 1.4 % ophthalmic solution Place 1 drop into both eyes as needed for dry eyes.     vitamin C (ASCORBIC ACID) 500 MG tablet Take 500 mg by mouth daily.     zinc sulfate 220 (50 Zn) MG capsule Take 1 capsule (220 mg total) by mouth daily. 30 capsule 0   amLODipine (NORVASC) 5 MG tablet Take 5 mg in the morning and take another 5 mg in the afternoon as needed if BP >150/100 (Patient not taking: Reported on 10/15/2022) 90 tablet 1   No current facility-administered medications for this visit.   Facility-Administered Medications Ordered in Other Visits  Medication Dose Route Frequency Provider Last Rate Last Admin   lanreotide acetate (SOMATULINE DEPOT) injection 120 mg  120 mg Subcutaneous Once Jeralyn Ruths, MD        OBJECTIVE: Vitals:   10/15/22 1116  BP: (!) 144/79  Pulse: 60  Temp: (!) 97 F (36.1 C)  SpO2: 98%     Body mass index is 27.19 kg/m.    ECOG FS:0 - Asymptomatic  General: Well-developed, well-nourished, no acute distress. Eyes: Pink conjunctiva, anicteric sclera. HEENT: Normocephalic, moist mucous membranes. Lungs: No audible wheezing or coughing. Heart: Regular rate and rhythm. Abdomen: Soft, nontender, no obvious distention. Musculoskeletal: No edema, cyanosis, or clubbing. Neuro: Alert, answering all questions appropriately. Cranial nerves grossly intact. Skin: No rashes or petechiae noted. Psych: Normal affect.  LAB RESULTS:  Lab Results  Component Value Date   NA 142 10/15/2022   K 4.2 10/15/2022   CL 109  10/15/2022   CO2 25 10/15/2022   GLUCOSE 119 (H) 10/15/2022   BUN 22 10/15/2022   CREATININE 0.85 10/15/2022   CALCIUM 9.5 10/15/2022   PROT 7.0 10/15/2022   ALBUMIN 4.0 10/15/2022   AST 25 10/15/2022   ALT 20 10/15/2022   ALKPHOS 139 (H) 10/15/2022   BILITOT 1.0 10/15/2022   GFRNONAA >60 10/15/2022   GFRAA >60 01/29/2020    Lab Results  Component Value Date   WBC 6.8 10/15/2022   NEUTROABS 5.1 10/15/2022   HGB 15.0 10/15/2022   HCT 45.4 10/15/2022   MCV 92.1 10/15/2022   PLT 177 10/15/2022     STUDIES: NM PET DOTATATE SKULL BASE TO MID THIGH  Result Date: 09/30/2022 CLINICAL DATA:  Subsequent treatment strategy for well differentiated neuroendocrine tumor of the lung with liver metastases. EXAM: NUCLEAR MEDICINE PET SKULL BASE TO THIGH TECHNIQUE: 10.10 mCi Cu 64 DOTATATE was injected intravenously. Full-ring PET imaging was performed from the skull base to thigh after the radiotracer. CT data was obtained and used for attenuation correction and anatomic localization. COMPARISON:  08/28/2021 FINDINGS: NECK No radiotracer activity in neck lymph nodes. Incidental CT findings: None CHEST No radiotracer accumulation within mediastinal or hilar lymph nodes. No suspicious pulmonary nodules on the CT scan. Incidental CT finding:Status post left upper lobectomy. Emphysema. Aortic atherosclerosis and coronary artery calcifications. ABDOMEN/PELVIS -Partially exophytic lesion arising off the inferior and medial margin of segment 2 measures approximately 2.3 cm with SUV max of 6.08, image 104. Previously this measured 2.2 cm with SUV max of 9.03. -Lesion in the posterior right lobe of liver measures 2.6 cm and has an SUV max of 9.04, image 106/4. Previously 1.7 cm with SUV max of 7.0. Has an SUV max of 9.04. No tracer avid abdominopelvic lymph nodes. Physiologic activity noted in the  liver, spleen, adrenal glands and kidneys. Incidental CT findings:Calcification within the bladder measures 1 cm.  Prostate gland appears enlarged with diffuse increased uptake. This appears similar to the previous exam. SKELETON Signs of multifocal bone metastases noted, including: -previous tracer avid lesion within the proximal humeral diaphysis has an SUV max of 6.83. This is compared with 5.15 previously. No corresponding lytic or sclerotic lesion noted on the CT images. -previous tracer avid lesion within the distal humerus has an SUV max of 3.60. Previously 3.18. No corresponding lytic or sclerotic changes identified on the CT images. -There is focal asymmetric increased uptake within the right iliac bone along the superior margin of the acetabulum with SUV max of 5.36. On the previous exam the SUV max was equal to 4.22. -There is a sclerotic lesion in the left side of the T11 vertebral body measuring 1.4 cm with SUV max of 3.48. Previously 1.4 cm with SUV max of 2.17. -Sclerotic lesion involving the right-side of the T2 vertebra measures 0.8 cm with SUV max of 3.18, image 58/2. Previously this measured 0.8 cm with SUV max of 1.96. Incidental CT findings:None IMPRESSION: 1. Metastasis within the posterior right lobe of liver is mildly increased in size and degree of tracer uptake. Lesion arising off the lateral segment of left hepatic lobe is stable in size with mildly decreased tracer uptake compared with the previous exam. 2. Multifocal tracer avid bone metastases are again noted. There has been mild increase in SUV max associated with the lesions within the proximal left humerus, T11 vertebral body, and right iliac bone. 3.  Aortic Atherosclerosis (ICD10-I70.0). Electronically Signed   By: Signa Kell M.D.   On: 09/30/2022 12:04    ONCOLOGY HISTORY: Patient's pathology, imaging, and outside facility notes reviewed extensively. Patient underwent left upper lobe lobectomy in [place] on June 04, 2013. Final pathology results revealed a well differentiated neuroendocrine tumor with positive bronchial margins.  Patient did not receive adjuvant XRT or chemotherapy at that time.  His most recent Y 90 ablation to his liver occurred on April 28, 2018.   CT scan results from December 28, 2021  with 2 new foci noted in his liver likely consistent with progressive disease.  Patient subsequently underwent 4 treatments with Lutathera completing on August 04, 2022.  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  Despite treatment with Lutathera, patient had no appreciable change in his disease burden and potentially a new focus in his liver.  MRI results from yesterday are pending at time of dictation.  Patient last received lanreotide on September 01, 2022.  Proceed with treatment today.  Return to clinic in 1 month with repeat laboratory work, further evaluation, and continuation of treatment.   Left mainstem endobronchial mass: CT scan results as above.  Consistent with neuroendocrine tumor.  Continue follow-up with pulmonology as needed.    I spent a total of 30 minutes reviewing chart data, face-to-face evaluation with the patient, counseling and coordination of care as detailed above.     Patient expressed understanding and was in agreement with this plan. He also understands that He can call clinic at any time with any questions, concerns, or complaints.    Cancer Staging  Neuroendocrine carcinoma of lung (HCC) Staging form: Lung, AJCC 8th Edition - Clinical stage from 12/28/2016: Stage IVA (cT2a, cN0, cM1b) - Signed by  Jeralyn Ruths, MD on 12/28/2016 Sites of metastasis: Liver   Jeralyn Ruths, MD   10/15/2022 11:57 AM

## 2022-10-18 DIAGNOSIS — D485 Neoplasm of uncertain behavior of skin: Secondary | ICD-10-CM | POA: Diagnosis not present

## 2022-10-18 DIAGNOSIS — Z872 Personal history of diseases of the skin and subcutaneous tissue: Secondary | ICD-10-CM | POA: Diagnosis not present

## 2022-10-18 DIAGNOSIS — L578 Other skin changes due to chronic exposure to nonionizing radiation: Secondary | ICD-10-CM | POA: Diagnosis not present

## 2022-10-18 DIAGNOSIS — Z859 Personal history of malignant neoplasm, unspecified: Secondary | ICD-10-CM | POA: Diagnosis not present

## 2022-10-18 DIAGNOSIS — L570 Actinic keratosis: Secondary | ICD-10-CM | POA: Diagnosis not present

## 2022-10-18 DIAGNOSIS — D044 Carcinoma in situ of skin of scalp and neck: Secondary | ICD-10-CM | POA: Diagnosis not present

## 2022-10-19 LAB — CHROMOGRANIN A: Chromogranin A (ng/mL): 2223 ng/mL — ABNORMAL HIGH (ref 0.0–101.8)

## 2022-10-25 ENCOUNTER — Encounter: Payer: Self-pay | Admitting: Internal Medicine

## 2022-10-26 ENCOUNTER — Telehealth: Payer: Self-pay | Admitting: Internal Medicine

## 2022-10-26 ENCOUNTER — Encounter: Payer: Self-pay | Admitting: Internal Medicine

## 2022-10-26 ENCOUNTER — Other Ambulatory Visit: Payer: Self-pay | Admitting: Internal Medicine

## 2022-10-26 DIAGNOSIS — E782 Mixed hyperlipidemia: Secondary | ICD-10-CM

## 2022-10-26 MED ORDER — ROSUVASTATIN CALCIUM 20 MG PO TABS
20.0000 mg | ORAL_TABLET | Freq: Every day | ORAL | 1 refills | Status: DC
Start: 2022-10-26 — End: 2023-04-26

## 2022-10-26 NOTE — Telephone Encounter (Signed)
Please send in another statin

## 2022-10-26 NOTE — Telephone Encounter (Signed)
Pt called in says doesn't want to pay the 73.00 for atorvastatin (LIPITOR) 40 MG tablet , because was told on a different tier now.  He hasn't been paying anything before. Please calll back to discuss further

## 2022-10-27 ENCOUNTER — Ambulatory Visit
Admission: RE | Admit: 2022-10-27 | Discharge: 2022-10-27 | Disposition: A | Discharge status: Home / Self Care | Payer: Medicare Other | Source: Ambulatory Visit | Attending: Interventional Radiology | Admitting: Interventional Radiology

## 2022-10-27 DIAGNOSIS — C7A1 Malignant poorly differentiated neuroendocrine tumors: Secondary | ICD-10-CM | POA: Diagnosis not present

## 2022-10-27 DIAGNOSIS — C7B8 Other secondary neuroendocrine tumors: Secondary | ICD-10-CM

## 2022-10-27 NOTE — Progress Notes (Signed)
Patient ID: Jason Malone, male   DOB: 1941-01-24, 82 y.o.   MRN: [id number]       Chief Complaint:   Metastatic neuroendocrine tumor to the liver  Referring Physician(s): Dr. Orlie Dakin  History of Present Illness: Jason Malone   is a 82 y.o. male who is well-known to our interventional service.  He has stage IV metastatic neuroendocrine tumor to the liver.  He is 4 years status post whole liver Y90 radioembolization performed in 2019.  He has been closely followed as an outpatient with surveillance imaging.  Overall he continues to do very well physically with an excellent functional status.  He is now also status post 4 Lutathera treatments.  Posttreatment PET scan demonstrated little change in the low-level PET avid hepatic lesions and some increase in the known bone metastases.  New hepatic lesions were suspected in the right lobe by the recent PET/CT.  Also, his last chromogranin A was elevated at 1213.  Last Sandostatin injection was April 2024 according to the patient.  For further evaluation, he did have a surveillance repeat MRI scan.  This confirms our suspicion with a few new as well as enlarging liver metastases predominantly throughout the right lobe.  There are some smaller suspicious lesions in the left lobe but the dominant tumor burden is on the right side.  Past Medical History:  Diagnosis Date   Allergic rhinitis    Aortic atherosclerosis (HCC)    Benign prostatic hypertrophy with lower urinary tract symptoms (LUTS)    Bradycardia    Chest pain    a. 05/2013 MV: EF 70%, no ischemia.   Chronic respiratory failure with hypoxia (HCC)    COPD (chronic obstructive pulmonary disease) (HCC)    Coronary artery disease    COVID-19    Diabetes mellitus without complication (HCC)    last a1c 6.5 09-16-21 no meds   Diverticulitis    DIVERTICULOSIS   Dysrhythmia    Elevated LFTs    Enlarged prostate    Essential hypertension    CONTROLLED ON MEDS   Full dentures    H/O  hypokalemia    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 08/28/2016   Hyperlipemia    Hypomagnesemia    IFG (impaired fasting glucose)    Liver cancer (HCC) 12/08/2016   Liver mass, left lobe 08/28/2016   Probably hemangioma; will get MRI of liver, refer to GI   Lung cancer (HCC)    a. carcinoid, left lung, Stage 1b (T2a, N0, cM0);  b. 05/2013 s/p VATS & LULobectomy.   Metabolic acidosis    Monocytosis    Osteopenia    Paroxysmal atrial flutter (HCC)    a. 03/2013->no recurrence;  b. CHA2DS2VASc = 2-->not currently on anticoagulation;  c. 05/2012 Echo: EF 55-60%, normal RV.   Pneumonia    Postinflammatory pulmonary fibrosis (HCC)    Rectal bleeding    Smoking history    Wears glasses     Past Surgical History:  Procedure Laterality Date   BRONCHOSCOPY     04/06/2013   CARDIOVASCULAR STRESS TEST  05/2013   a. No evidence of ischemia or infarct, EF 70%, no WMAs   CATARACT EXTRACTION W/PHACO Left 07/14/2015   Procedure: CATARACT EXTRACTION PHACO AND INTRAOCULAR LENS PLACEMENT (IOC);  Surgeon: Lockie Mola, MD;  Location: [facility];  Service: Ophthalmology;  Laterality: Left;   CATARACT EXTRACTION W/PHACO Right 10/01/2015   Procedure: CATARACT EXTRACTION PHACO AND INTRAOCULAR LENS PLACEMENT (IOC);  Surgeon: Radene Knee  Brasington, MD;  Location: [facility];  Service: Ophthalmology;  Laterality: Right;   COLONOSCOPY W/ POLYPECTOMY     bleed after-had to go to surgery to stop bleeding via colonoscopy   COLONOSCOPY WITH PROPOFOL N/A 11/19/2015   Procedure: COLONOSCOPY WITH PROPOFOL;  Surgeon: [name], MD;  Location: [facility];  Service: Endoscopy;  Laterality: N/A;   DUPUYTREN CONTRACTURE RELEASE  12/15/2011   Procedure: DUPUYTREN CONTRACTURE RELEASE;  Surgeon: Nicki Reaper, MD;  Location: [facility];  Service: Orthopedics;  Laterality: Left;  Fasciotomy left ring finger dupuytrens   ELECTROMAGNETIC NAVIGATION BROCHOSCOPY Left  09/19/2019   Procedure: ELECTROMAGNETIC NAVIGATION BRONCHOSCOPY;  Surgeon: Salena Saner, MD;  Location: [facility];  Service: Cardiopulmonary;  Laterality: Left;   FASCIECTOMY Right 11/07/2018   Procedure: SEGMENTAL FASCIECTOMY RIGHT RING FINGER;  Surgeon: Cindee Salt, MD;  Location: [facility];  Service: Orthopedics;  Laterality: Right;  AXILLARY BLOCK   FLEXIBLE BRONCHOSCOPY Left 11/26/2019   Procedure: FLEXIBLE BRONCHOSCOPY WITH CRYO THERAPY;  Surgeon: Salena Saner, MD;  Location: [facility];  Service: Cardiopulmonary;  Laterality: Left;   FLEXIBLE BRONCHOSCOPY Left 10/14/2021   Procedure: FLEXIBLE BRONCHOSCOPY;  Surgeon: Salena Saner, MD;  Location: [facility];  Service: Pulmonary;  Laterality: Left;   IR ANGIOGRAM SELECTIVE EACH ADDITIONAL VESSEL  07/26/2017   IR ANGIOGRAM SELECTIVE EACH ADDITIONAL VESSEL  07/26/2017   IR ANGIOGRAM SELECTIVE EACH ADDITIONAL VESSEL  07/26/2017   IR ANGIOGRAM SELECTIVE EACH ADDITIONAL VESSEL  07/26/2017   IR ANGIOGRAM SELECTIVE EACH ADDITIONAL VESSEL  07/26/2017   IR ANGIOGRAM SELECTIVE EACH ADDITIONAL VESSEL  08/11/2017   IR ANGIOGRAM SELECTIVE EACH ADDITIONAL VESSEL  04/28/2018   IR ANGIOGRAM VISCERAL SELECTIVE  07/26/2017   IR ANGIOGRAM VISCERAL SELECTIVE  08/11/2017   IR ANGIOGRAM VISCERAL SELECTIVE  04/28/2018   IR EMBO ARTERIAL NOT HEMORR HEMANG INC GUIDE ROADMAPPING  07/26/2017   IR EMBO TUMOR ORGAN ISCHEMIA INFARCT INC GUIDE ROADMAPPING  08/11/2017   IR EMBO TUMOR ORGAN ISCHEMIA INFARCT INC GUIDE ROADMAPPING  04/28/2018   IR RADIOLOGIST EVAL & MGMT  07/07/2017   IR RADIOLOGIST EVAL & MGMT  09/07/2017   IR RADIOLOGIST EVAL & MGMT  12/21/2017   IR RADIOLOGIST EVAL & MGMT  03/22/2018   IR RADIOLOGIST EVAL & MGMT  06/08/2018   IR RADIOLOGIST EVAL & MGMT  11/01/2018   IR RADIOLOGIST EVAL & MGMT  03/21/2019   IR RADIOLOGIST EVAL & MGMT  11/08/2019   IR RADIOLOGIST EVAL & MGMT  07/01/2020   IR RADIOLOGIST EVAL & MGMT  12/18/2020   IR RADIOLOGIST  EVAL & MGMT  06/18/2021   IR RADIOLOGIST EVAL & MGMT  12/14/2021   IR RADIOLOGIST EVAL & MGMT  12/31/2021   IR US GUIDE VASC ACCESS RIGHT  07/26/2017   IR US GUIDE VASC ACCESS RIGHT  08/11/2017   IR US GUIDE VASC ACCESS RIGHT  04/28/2018   LUNG LOBECTOMY  06/10/13   upper left lung   MULTIPLE TOOTH EXTRACTIONS     REMOVAL RETAINED LENS Right 11/17/2015   Procedure: REMOVAL RETAINED LENS FROAGMENTS RIGHT EYE;  Surgeon: Lockie Mola, MD;  Location: [facility];  Service: Ophthalmology;  Laterality: Right;   TONSILLECTOMY     UPPER GASTROINTESTINAL ENDOSCOPY  11-03-15   Dr Lemar Livings   VASECTOMY     VIDEO ASSISTED THORACOSCOPY (VATS)/WEDGE RESECTION Left 06/04/2013   Procedure: VIDEO ASSISTED THORACOSCOPY (VATS)/WEDGE RESECTION;  Surgeon: Delight Ovens, MD;  Location: [facility];  Service:  Thoracic;  Laterality: Left;   VIDEO BRONCHOSCOPY N/A 06/04/2013   Procedure: VIDEO BRONCHOSCOPY;  Surgeon: Delight Ovens, MD;  Location: [facility];  Service: Thoracic;  Laterality: N/A;   VIDEO BRONCHOSCOPY Left 08/12/2021   Procedure: VIDEO BRONCHOSCOPY WITHOUT FLUORO;  Surgeon: Salena Saner, MD;  Location: [facility];  Service: Cardiopulmonary;  Laterality: Left;   VIDEO BRONCHOSCOPY Left 09/09/2021   Procedure: VIDEO BRONCHOSCOPY WITHOUT FLUORO;  Surgeon: Salena Saner, MD;  Location: [facility];  Service: Cardiopulmonary;  Laterality: Left;    Allergies: Tape  Medications: Prior to Admission medications   Medication Sig Start Date End Date Taking? Authorizing Provider  amLODipine (NORVASC) 5 MG tablet Take 5 mg in the morning and take another 5 mg in the afternoon as needed if BP >150/100 Patient not taking: Reported on 10/15/2022 09/03/22   Margarita Mail, DO  aspirin EC 81 MG tablet Take 81 mg by mouth daily.    [provider]  b complex vitamins capsule Take 1 capsule by mouth daily.    [provider]  cholecalciferol (VITAMIN D3) 25 MCG (1000 UNIT) tablet Take 1,000  Units by mouth daily.    [provider]  Cinnamon 500 MG capsule Take 500 mg by mouth at bedtime.    [provider]  DM-APAP-CPM (CORICIDIN HBP PO) Take 2 tablets by mouth daily as needed (allergies/cold symptoms).    [provider]  glucose blood (ONETOUCH VERIO) test strip USE 1 STRIP TO CHECK GLUCOSE ONCE DAILY 08/24/22   Margarita Mail, DO  ipratropium (ATROVENT) 0.03 % nasal spray USE 2 SPRAY(S) IN [facility] NOSTRIL THREE TIMES DAILY 09/20/22   Salena Saner, MD  losartan (COZAAR) 100 MG tablet Take 1 tablet (100 mg total) by mouth every morning. 07/01/22   Margarita Mail, DO  Multiple Vitamins-Minerals (MULTIVITAMIN WITH MINERALS) tablet Take 1 tablet by mouth daily.    [provider]  ondansetron (ZOFRAN) 8 MG tablet Take 1 tablet (8 mg total) by mouth 2 (two) times daily as needed for nausea or vomiting. 06/09/22   Lorna Few, MD  polycarbophil (FIBERCON) 625 MG tablet Take 625 mg by mouth 2 (two) times daily.    [provider]  polyvinyl alcohol (LIQUIFILM TEARS) 1.4 % ophthalmic solution Place 1 drop into both eyes as needed for dry eyes.    [provider]  rosuvastatin (CRESTOR) 20 MG tablet Take 1 tablet (20 mg total) by mouth daily. 10/26/22   Margarita Mail, DO  vitamin C (ASCORBIC ACID) 500 MG tablet Take 500 mg by mouth daily.    [provider]  zinc sulfate 220 (50 Zn) MG capsule Take 1 capsule (220 mg total) by mouth daily. 06/18/19   Drema Dallas, MD     Family History  Problem Relation Age of Onset   Stroke Mother    Alzheimer's disease Mother    Hypertension Sister    Hyperlipidemia Sister    Hypertension Brother    Hyperlipidemia Brother    Diabetes Brother     Social History   Socioeconomic History   Marital status: Widowed    Spouse name: Talbert Forest   Number of children: 3   Years of education: Not on file   Highest education level: High school graduate  Occupational History   Not on  file  Tobacco Use   Smoking status: Former    Packs/day: 1.00    Years: 40.00    Additional pack years: 0.00    Total pack  years: 40.00    Types: Cigarettes    Quit date: 12/09/1998    Years since quitting: 23.8   Smokeless tobacco: Never  Vaping Use   Vaping Use: Never used  Substance and Sexual Activity   Alcohol use: No   Drug use: No   Sexual activity: Yes    Partners: Female  Other Topics Concern   Not on file  Social History Narrative   Not on file   Social Determinants of Health   Financial Resource Strain: Low Risk  (06/12/2018)   Overall Financial Resource Strain (CARDIA)    Difficulty of Paying Living Expenses: Not hard at all  Food Insecurity: No Food Insecurity (06/12/2018)   Hunger Vital Sign    Worried About Running Out of Food in the Last Year: Never true    Ran Out of Food in the Last Year: Never true  Transportation Needs: No Transportation Needs (06/12/2018)   PRAPARE - Administrator, Civil Service (Medical): No    Lack of Transportation (Non-Medical): No  Physical Activity: Sufficiently Active (06/12/2018)   Exercise Vital Sign    Days of Exercise per Week: 7 days    Minutes of Exercise per Session: 60 min  Stress: No Stress Concern Present (06/12/2018)   [employer] of Occupational Health - Occupational Stress Questionnaire    Feeling of Stress : Not at all  Social Connections: Somewhat Isolated (06/12/2018)   Social Connection and Isolation Panel [NHANES]    Frequency of Communication with Friends and Family: More than three times a week    Frequency of Social Gatherings with Friends and Family: More than three times a week    Attends Religious Services: Never    Database administrator or Organizations: No    Attends Engineer, structural: Never    Marital Status: Married    ECOG Status: 1 - Symptomatic but completely ambulatory  Review of Systems  Review of Systems: A 12 point ROS discussed and pertinent positives are  indicated in the HPI above.  All other systems are negative.      Physical Exam No direct physical exam was performed   Telehealth visit only today to review surveillance imaging and treatment options  Vital Signs: There were no vitals taken for this visit.  Imaging: MR ABDOMEN W WO CONTRAST  Result Date: 10/16/2022 CLINICAL DATA:  Metastatic neuroendocrine carcinoma restaging status post Lutathera treatment EXAM: MRI ABDOMEN WITHOUT AND WITH CONTRAST TECHNIQUE: Multiplanar multisequence MR imaging of the abdomen was performed both before and after the administration of intravenous contrast. CONTRAST:  7mL GADAVIST GADOBUTROL 1 MMOL/ML IV SOLN COMPARISON:  PET-CT, 09/30/2022, CT chest abdomen pelvis, 11/19/2021 MR abdomen, 12/21/2019 FINDINGS: Lower chest: No acute abnormality. Hepatobiliary: Multiple T2 hyperintense, contrast enhancing lesions throughout the liver. These are not obviously changed compared to most recent noncontrast PET-CT dated 09/30/2022. The previously PET avid lesion of the posterior inferior right lobe of the liver, hepatic segment VI measuring 3.2 x 2.6 cm is significantly increased in size compared to most recent contrast enhanced examination dated 11/19/2021, at which time it measured 2.1 x 1.8 cm (series 2, image 58). Several other lesions visible on prior CT, and not significantly PET avid on most recent PET-CT are not appreciably changed compared to prior CT, for example in the posterior right lobe of the liver, hepatic segment VII, measuring 1.6 x 1.6 cm (series 4, image 13). Partially exophytic lesion of the inferior left lobe of the liver, hepatic segment  III, is without internal contrast enhancement, corresponding to a low-level FDG avid lesion described by prior PET-CT (series 18, image 39), as above remaining lesions are with varying degrees of internal contrast enhancement. Status post cholecystectomy unchanged postoperative biliary ductal dilatation. Pancreas:  Unremarkable. No pancreatic ductal dilatation or surrounding inflammatory changes. Spleen: Normal in size without significant abnormality. Adrenals/Urinary Tract: Adrenal glands are unremarkable. Kidneys are normal, without renal calculi, solid lesion, or hydronephrosis. Stomach/Bowel: Stomach is within normal limits. No evidence of bowel wall thickening, distention, or inflammatory changes. Colonic diverticulosis. Vascular/Lymphatic: No significant vascular findings are present. No enlarged abdominal lymph nodes. Other: No abdominal wall hernia or abnormality. No ascites. Musculoskeletal: No acute osseous findings. Multiple heterogeneously enhancing osseous metastatic lesions throughout the thoracic and lumbar spine. IMPRESSION: 1. Multiple contrast enhancing metastatic liver lesions are again demonstrated. 2. PET avid lesion seen on recent PET-CT of the posterior inferior right lobe of the liver, hepatic segment VI measuring 3.2 x 2.6 cm is significantly increased in size compared to most recent contrast enhanced examination dated 11/19/2021, at which time it measured 2.1 x 1.8 cm. 3. Several other lesions visible on prior CT, and not significantly PET avid on most recent PET-CT are not appreciably changed compared to prior CT. These are however in general significantly increased in size and number compared to much more remote most recent prior MR dated 2021. 4. Partially exophytic lesion of the inferior left lobe of the liver, hepatic segment III, is without internal contrast enhancement, corresponding to a low-level FDG avid lesion described by prior PET-CT. 5. Multiple heterogeneously enhancing osseous metastatic lesions throughout the thoracic and lumbar spine. Electronically Signed   By: Jearld Lesch M.D.   On: 10/16/2022 22:48   NM PET DOTATATE SKULL BASE TO MID THIGH  Result Date: 09/30/2022 CLINICAL DATA:  Subsequent treatment strategy for well differentiated neuroendocrine tumor of the lung with  liver metastases. EXAM: NUCLEAR MEDICINE PET SKULL BASE TO THIGH TECHNIQUE: 10.10 mCi Cu 64 DOTATATE was injected intravenously. Full-ring PET imaging was performed from the skull base to thigh after the radiotracer. CT data was obtained and used for attenuation correction and anatomic localization. COMPARISON:  08/28/2021 FINDINGS: NECK No radiotracer activity in neck lymph nodes. Incidental CT findings: None CHEST No radiotracer accumulation within mediastinal or hilar lymph nodes. No suspicious pulmonary nodules on the CT scan. Incidental CT finding:Status post left upper lobectomy. Emphysema. Aortic atherosclerosis and coronary artery calcifications. ABDOMEN/PELVIS -Partially exophytic lesion arising off the inferior and medial margin of segment 2 measures approximately 2.3 cm with SUV max of 6.08, image 104. Previously this measured 2.2 cm with SUV max of 9.03. -Lesion in the posterior right lobe of liver measures 2.6 cm and has an SUV max of 9.04, image 106/4. Previously 1.7 cm with SUV max of 7.0. Has an SUV max of 9.04. No tracer avid abdominopelvic lymph nodes. Physiologic activity noted in the liver, spleen, adrenal glands and kidneys. Incidental CT findings:Calcification within the bladder measures 1 cm. Prostate gland appears enlarged with diffuse increased uptake. This appears similar to the previous exam. SKELETON Signs of multifocal bone metastases noted, including: -previous tracer avid lesion within the proximal humeral diaphysis has an SUV max of 6.83. This is compared with 5.15 previously. No corresponding lytic or sclerotic lesion noted on the CT images. -previous tracer avid lesion within the distal humerus has an SUV max of 3.60. Previously 3.18. No corresponding lytic or sclerotic changes identified on the CT images. -There is focal asymmetric increased  uptake within the right iliac bone along the superior margin of the acetabulum with SUV max of 5.36. On the previous exam the SUV max was  equal to 4.22. -There is a sclerotic lesion in the left side of the T11 vertebral body measuring 1.4 cm with SUV max of 3.48. Previously 1.4 cm with SUV max of 2.17. -Sclerotic lesion involving the right-side of the T2 vertebra measures 0.8 cm with SUV max of 3.18, image 58/2. Previously this measured 0.8 cm with SUV max of 1.96. Incidental CT findings:None IMPRESSION: 1. Metastasis within the posterior right lobe of liver is mildly increased in size and degree of tracer uptake. Lesion arising off the lateral segment of left hepatic lobe is stable in size with mildly decreased tracer uptake compared with the previous exam. 2. Multifocal tracer avid bone metastases are again noted. There has been mild increase in SUV max associated with the lesions within the proximal left humerus, T11 vertebral body, and right iliac bone. 3.  Aortic Atherosclerosis (ICD10-I70.0). Electronically Signed   By: Signa Kell M.D.   On: 09/30/2022 12:04    Labs:  CBC: Recent Labs    04/14/22 0845 06/09/22 0821 08/04/22 0833 10/15/22 1052  WBC 10.6* 8.4 7.6 6.8  HGB 15.8 16.1 16.0 15.0  HCT 48.3 49.4 48.7 45.4  PLT 188 153 146* 177    COAGS: No results for input(s): "INR", "APTT" in the last 8760 hours.  BMP: Recent Labs    04/14/22 0845 06/09/22 0821 08/04/22 0833 10/15/22 1052  NA 141 140 140 142  K 3.8 3.8 4.0 4.2  CL 104 104 106 109  CO2 27 25 26 25   GLUCOSE 168* 146* 140* 119*  BUN 18 18 21 22   CALCIUM 9.7 9.7 9.2 9.5  CREATININE 0.97 0.73 1.01 0.85  GFRNONAA >60 >60 >60 >60    LIVER FUNCTION TESTS: Recent Labs    04/14/22 0845 06/09/22 0821 08/04/22 0833 10/15/22 1052  BILITOT 1.0 0.9 0.8 1.0  AST 20 29 34 25  ALT 18 23 36 20  ALKPHOS 134* 131* 119 139*  PROT 7.2 7.7 6.6 7.0  ALBUMIN 3.9 4.4 4.2 4.0       Assessment and Plan:  Interval progression of metastatic neuroendocrine tumor predominately to the right hepatic lobe, confirmed by MRI scan.  Treatment options were  reviewed.  Our discussion centered around repeat Y90 radioembolization to the right lobe.  This would require repeat arterial mapping and hepatopulmonary shunt calculation since it has been 5 years since his previous Y90 treatment.  Then, he can be scheduled for right hepatic Y90 radioembolization (second treatment to the right lobe).  Review of his laboratory data confirms creatinine of 0.85.  Liver function test are within normal limits except for a chronically elevated alkaline phosphatase at 139.  Total bilirubin 1.0.  These procedures will be scheduled at [facility] in the next few weeks.  All questions were addressed.  He is in agreement with this plan.      Electronically Signed: Berdine Dance 10/27/2022, 9:51 AM   I spent a total of    40 Minutes in remote  clinical consultation, greater than 50% of which was counseling/coordinating care for This patient with metastatic neuroendocrine tumor to the liver.    Visit type: Audio only (telephone). Audio (no video) only due to patient's lack of internet/smartphone capability. Alternative for in-person consultation at [facility], [address] [place], [place], [place].   This format is felt to be most  appropriate for this patient at this time.  All issues noted in this document were discussed and addressed.

## 2022-11-03 ENCOUNTER — Encounter: Payer: Self-pay | Admitting: Internal Medicine

## 2022-11-08 ENCOUNTER — Ambulatory Visit (INDEPENDENT_AMBULATORY_CARE_PROVIDER_SITE_OTHER): Payer: Medicare Other | Admitting: Pulmonary Disease

## 2022-11-08 ENCOUNTER — Encounter: Payer: Self-pay | Admitting: Pulmonary Disease

## 2022-11-08 VITALS — BP 118/60 | HR 59 | Temp 97.9°F | Ht 64.0 in | Wt 158.4 lb

## 2022-11-08 DIAGNOSIS — C7A8 Other malignant neuroendocrine tumors: Secondary | ICD-10-CM | POA: Diagnosis not present

## 2022-11-08 DIAGNOSIS — J8410 Pulmonary fibrosis, unspecified: Secondary | ICD-10-CM | POA: Diagnosis not present

## 2022-11-08 NOTE — Patient Instructions (Signed)
Your lungs sound is really clear today.  On the PET/CT the areas of the lung that were treated show no activity meaning that these had a good response.  Continue following with Dr. Orlie Dakin with regards to the liver deposits.  We will see him in follow-up in 6 months time call sooner should any new problems arise.

## 2022-11-08 NOTE — Progress Notes (Signed)
Subjective:    Patient ID: Jason Malone, male    DOB: Sep 18, 1940, 82 y.o.   MRN: [id number]  Patient Care Team: Margarita Mail, DO as PCP - General (Internal Medicine) Antonieta Iba, MD as PCP - Cardiology (Cardiology) Lemar Livings, Merrily Pew, MD (General Surgery) Hulda Marin, MD (Inactive) as Referring Physician (Cardiothoracic Surgery) Antonieta Iba, MD as Consulting Physician (Cardiology) Delight Ovens, MD (Inactive) as Consulting Physician (Cardiothoracic Surgery) Jeralyn Ruths, MD as Consulting Physician (Oncology) Kerman Passey, MD as Attending Physician [facility])  Chief Complaint  Patient presents with   Follow-up    No SOB or wheezing. Dry cough when he lays back in his chair.     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  I last saw him on 02 August 2022.  This is a scheduled visit.  Recall that the patient had previously undergone resection for carcinoid tumor in 2015 this involved a left upper lobectomy that was a challenging dissection according to the thoracic surgeons notes.  He had recurrence noted in August 2018 when liver involvement was noted.  In January 2021 he has severe case of COVID-19 and was admitted to the hospital due to respiratory failure for the same.  CT scan was performed 11 May 2019 that showed involvement of COVID in the right lung however the left lung had a lobulated mass on the left mainstem bronchus and appeared to be arising from the suture line of the prior resection.  After he recovered from COVID he remained oxygen dependent and with increased dyspnea.  He was evaluated initially on 31 July 2019 for potential bronchoscopy.  However the patient remained very debilitated after his COVID diagnosis and required a period of stabilization.  Subsequently he had cryoablation and tumor destruction on  19 Sep 2019 this was followed by further tumor destruction on 10 October 2019 and a third treatment on 26 November 2019. He did well after that with only minimal residual noted. The tumor was at the prior suture line.  Patient did well until August 2022 then he was noted that he had recurrence at the suture line.  The patient elected to postpone procedures at that time but on 12 June 2021 he had a chest CT with contrast that showed that the soft tissue in the anterior aspect of the left mainstem bronchus had continued to increase.  He started noticing more shortness of breath.  He had another bout with COVID but subsequently was able to undergo repeat bronchoscopy on 12 August 2021 and at that time he had complete obstruction of his left mainstem bronchus.  He underwent cryoablation, tumor debulking followed by a second session on 09 Sep 2021.  This time he had only minimal residual noted.  He completed his third cycle on 14 October 2021.  At that time only minimal residual tissue was noted and he underwent cryoablation to the area.  Tolerated that well.  The patient has been noted to have osseous metastatic lesions as well as hepatic lesions and has completed Lutathera treatments.  A dotatate PET CT was on 14 December 2021 showed no evidence of activity on the previously ablated bronchial tissue.  A repeat dotatate PET/CT on 30 Sep 2022 continues to show no evidence of activity on the previously ablated bronchial tissue, unfortunately he continues to have increased size of the  hepatic lesions and bone lesions.  He is currently undergoing evaluation for Y90 treatment to the liver metastases.    He has been doing well, he does not endorse any fevers, chills or sweats. He does still occasionally get short of breath only on heavy exertion.  He has rare occasional cough that is dry and nonproductive.  No hemoptysis.  No sputum production.  No weight loss or anorexia.  Previously reported back pain due to spinal metastasis but this  has now resolved after Lutathera therapy. He has known sclerotic metastatic lesions to the spine.   He lost his wife, Jason Malone, in December 2023, he appears to be coping well after an initial period of grieving.  He remains hopeful and optimistic without evidence of depression.    Review of Systems A 10 point review of systems was performed and it is as noted above otherwise negative.   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   Respiratory failure (HCC) 06/11/2019   Elevated LFTs 06/11/2019   Pneumonia due to COVID-19 virus 06/10/2019   Contracture of palmar fascia 10/11/2018   Microalbuminuria due to type 2 diabetes mellitus (HCC) 04/12/2018   Type 2 diabetes mellitus (HCC) 04/10/2018   Osteopenia determined by x-ray 01/07/2017   Neuroendocrine carcinoma of lung (HCC) 12/28/2016   Low back pain 12/17/2016   Liver mass, left lobe 08/28/2016   Aortic atherosclerosis (HCC) 04/06/2016   Coronary artery disease involving native heart without angina pectoris 04/06/2016   History of colonic polyps 10/28/2015   Medication monitoring encounter 08/15/2015   Paroxysmal atrial flutter (HCC)    Elevated serum alkaline phosphatase level 03/05/2015   Allergic rhinitis    BPH without urinary obstruction    Hyperlipidemia 02/13/2014   Orthostasis 07/18/2013   Sinus bradycardia 07/18/2013   S/P lobectomy of lung 06/04/2013   Atrial flutter (HCC) 04/09/2013   Smoking history 04/09/2013   Essential  hypertension 04/09/2013   Rectal bleeding 12/15/2012    Social History   Tobacco Use   Smoking status: Former    Packs/day: 1.00    Years: 40.00    Additional pack years: 0.00    Total pack years: 40.00    Types: Cigarettes    Quit date: 12/09/1998    Years since quitting: 23.9   Smokeless tobacco: Never  Substance Use Topics   Alcohol use: No    Allergies  Allergen Reactions   Tape Itching    Surgical tapes     Current Meds  Medication Sig   amLODipine (NORVASC) 5 MG tablet Take 5 mg in the morning and take another 5 mg in the afternoon as needed if BP >150/100   aspirin EC 81 MG tablet Take 81 mg by mouth daily.   b complex vitamins capsule Take 1 capsule by mouth daily.   cholecalciferol (VITAMIN D3) 25 MCG (1000 UNIT) tablet Take 1,000 Units by mouth daily.   Cinnamon 500 MG capsule Take 500 mg by mouth at bedtime.   DM-APAP-CPM (CORICIDIN HBP PO) Take 2 tablets by mouth daily as  needed (allergies/cold symptoms).   glucose blood (ONETOUCH VERIO) test strip USE 1 STRIP TO CHECK GLUCOSE ONCE DAILY   ipratropium (ATROVENT) 0.03 % nasal spray USE 2 SPRAY(S) IN EACH NOSTRIL THREE TIMES DAILY   losartan (COZAAR) 100 MG tablet Take 1 tablet (100 mg total) by mouth every morning.   Multiple Vitamins-Minerals (MULTIVITAMIN WITH MINERALS) tablet Take 1 tablet by mouth daily.   ondansetron (ZOFRAN) 8 MG tablet Take 1 tablet (8 mg total) by mouth 2 (two) times daily as needed for nausea or vomiting.   polycarbophil (FIBERCON) 625 MG tablet Take 625 mg by mouth 2 (two) times daily.   polyvinyl alcohol (LIQUIFILM TEARS) 1.4 % ophthalmic solution Place 1 drop into both eyes as needed for dry eyes.   rosuvastatin (CRESTOR) 20 MG tablet Take 1 tablet (20 mg total) by mouth daily.   vitamin C (ASCORBIC ACID) 500 MG tablet Take 500 mg by mouth daily.   zinc sulfate 220 (50 Zn) MG capsule Take 1 capsule (220 mg total) by mouth daily.    Immunization History  Administered Date(s)  Administered   Fluad Quad(high Dose 65+) 01/24/2019, 01/23/2020, 01/30/2021, 02/01/2022   Influenza, High Dose Seasonal PF 02/12/2016, 02/14/2017, 01/17/2018   Influenza,inj,Quad PF,6+ Mos 02/14/2015   PFIZER(Purple Top)SARS-COV-2 Vaccination 05/24/2019, 10/02/2019, 04/14/2020   PNEUMOCOCCAL CONJUGATE-20 12/28/2021   Pneumococcal Conjugate-13 02/01/2014   Pneumococcal Polysaccharide-23 05/10/2008        Objective:     BP 118/60 (BP Location: Left Arm, Cuff Size: Normal)   Pulse (!) 59   Temp 97.9 F (36.6 C)   Ht 5\' 4"  (1.626 m)   Wt 158 lb 6.4 oz (71.8 kg)   SpO2 98%   BMI 27.19 kg/m   SpO2: 98 % O2 Device: None (Room air)  GENERAL: Awake and alert, well developed, well nourished gentleman, in no respiratory distress, fully ambulatory, no conversational dyspnea. HEAD: Normocephalic, atraumatic. EYES: Pupils equal, round, reactive to light.  No scleral icterus. MOUTH: Wears dentures.  No thrush. NECK: Supple. No thyromegaly. No nodules. No JVD.  Trachea is midline PULMONARY:  Symmetrical and good air entry,no wheezes or rhonchi noted, no crackles. CARDIOVASCULAR: S1 and S2.  Regular rate and rhythm.No rubs murmurs or gallops appreciated. GASTROINTESTINAL: Benign. MUSCULOSKELETAL: No joint deformity, no clubbing, no edema. NEUROLOGIC: No focal deficit, no gait disturbance, speech is fluent. SKIN: Intact,warm,dry. No rashes noted on limited exam. PSYCH: Mood and behavior normal.     Assessment & Plan:     ICD-10-CM   1. Neuroendocrine carcinoma of lung (HCC) -stage IVa  C7A.8    No evidence of recurrence on previously ablated area left lung Unfortunately increasing liver metastasis size Being considered for Y90    2. Postinflammatory pulmonary fibrosis (HCC)  J84.10    No evidence of progression Doing well respiratory wise     Will see the patient in follow-up in 6 months time he is to contact us prior to that time should problems arise.  Gailen Shelter,  MD Advanced Bronchoscopy PCCM [facility]    *This note was dictated using voice recognition software/Dragon.  Despite best efforts to proofread, errors can occur which can change the meaning. Any transcriptional errors that result from this process are unintentional and may not be fully corrected at the time of dictation.

## 2022-11-12 ENCOUNTER — Other Ambulatory Visit: Payer: Self-pay

## 2022-11-12 DIAGNOSIS — C7A8 Other malignant neuroendocrine tumors: Secondary | ICD-10-CM

## 2022-11-15 ENCOUNTER — Inpatient Hospital Stay (HOSPITAL_BASED_OUTPATIENT_CLINIC_OR_DEPARTMENT_OTHER): Payer: Medicare Other | Admitting: Oncology

## 2022-11-15 ENCOUNTER — Inpatient Hospital Stay: Payer: Medicare Other | Attending: Oncology

## 2022-11-15 ENCOUNTER — Encounter: Payer: Self-pay | Admitting: Oncology

## 2022-11-15 ENCOUNTER — Inpatient Hospital Stay: Payer: Medicare Other

## 2022-11-15 VITALS — BP 140/74 | HR 58 | Temp 97.9°F | Resp 16 | Ht 64.0 in | Wt 159.4 lb

## 2022-11-15 DIAGNOSIS — Z79899 Other long term (current) drug therapy: Secondary | ICD-10-CM | POA: Diagnosis not present

## 2022-11-15 DIAGNOSIS — C7A8 Other malignant neuroendocrine tumors: Secondary | ICD-10-CM | POA: Diagnosis not present

## 2022-11-15 DIAGNOSIS — C7B8 Other secondary neuroendocrine tumors: Secondary | ICD-10-CM | POA: Diagnosis not present

## 2022-11-15 LAB — CMP (CANCER CENTER ONLY)
ALT: 17 U/L (ref 0–44)
AST: 19 U/L (ref 15–41)
Albumin: 4.1 g/dL (ref 3.5–5.0)
Alkaline Phosphatase: 112 U/L (ref 38–126)
Anion gap: 7 (ref 5–15)
BUN: 21 mg/dL (ref 8–23)
CO2: 26 mmol/L (ref 22–32)
Calcium: 9.5 mg/dL (ref 8.9–10.3)
Chloride: 106 mmol/L (ref 98–111)
Creatinine: 0.99 mg/dL (ref 0.61–1.24)
GFR, Estimated: >60 mL/min (ref >60)
Glucose, Bld: 142 mg/dL — ABNORMAL HIGH (ref 70–99)
Potassium: 4.4 mmol/L (ref 3.5–5.1)
Sodium: 139 mmol/L (ref 135–145)
Total Bilirubin: 0.6 mg/dL (ref 0.3–1.2)
Total Protein: 7 g/dL (ref 6.5–8.1)

## 2022-11-15 LAB — CBC WITH DIFFERENTIAL (CANCER CENTER ONLY)
Abs Immature Granulocytes: 0.04 10*3/uL (ref 0.00–0.07)
Basophils Absolute: 0.1 10*3/uL (ref 0.0–0.1)
Basophils Relative: 1 %
Eosinophils Absolute: 0.1 10*3/uL (ref 0.0–0.5)
Eosinophils Relative: 2 %
HCT: 44.9 % (ref 39.0–52.0)
Hemoglobin: 14.8 g/dL (ref 13.0–17.0)
Immature Granulocytes: 1 %
Lymphocytes Relative: 7 %
Lymphs Abs: 0.6 10*3/uL — ABNORMAL LOW (ref 0.7–4.0)
MCH: 30.4 pg (ref 26.0–34.0)
MCHC: 33 g/dL (ref 30.0–36.0)
MCV: 92.2 fL (ref 80.0–100.0)
Monocytes Absolute: 1.2 10*3/uL — ABNORMAL HIGH (ref 0.1–1.0)
Monocytes Relative: 15 %
Neutro Abs: 6.2 10*3/uL (ref 1.7–7.7)
Neutrophils Relative %: 74 %
Platelet Count: 162 10*3/uL (ref 150–400)
RBC: 4.87 MIL/uL (ref 4.22–5.81)
RDW: 13.9 % (ref 11.5–15.5)
WBC Count: 8.2 10*3/uL (ref 4.0–10.5)
nRBC: 0 % (ref 0.0–0.2)

## 2022-11-15 MED ORDER — LANREOTIDE ACETATE 120 MG/0.5ML ~~LOC~~ SOLN
120.0000 mg | Freq: Once | SUBCUTANEOUS | Status: AC
  Administered 2022-11-15: 120 mg via SUBCUTANEOUS
  Filled 2022-11-15: qty 120

## 2022-11-15 NOTE — Progress Notes (Signed)
[facility]  [phone] [phone]) [phone]  ID: Jason Malone OB: [birth year]  MR#: [id number]  AOZ#:[id number]  Patient Care Team: Margarita Mail, DO as PCP - General (Internal Medicine) Antonieta Iba, MD as PCP - Cardiology (Cardiology) Byrnett, Merrily Pew, MD (General Surgery) Hulda Marin, MD (Inactive) as Referring Physician (Cardiothoracic Surgery) Antonieta Iba, MD as Consulting Physician (Cardiology) Delight Ovens, MD (Inactive) as Consulting Physician (Cardiothoracic Surgery) Jeralyn Ruths, MD as Consulting Physician (Oncology) Sherie Don Janit Bern, MD as Attending Physician (Family Medicine)   CHIEF COMPLAINT: 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  INTERVAL HISTORY: Patient returns to clinic today for further evaluation and continuation of lanreotide.  He has completed his Lutathera treatments.  He currently feels well and is asymptomatic.  He denies any weakness or fatigue today.  He denies any pain. He has no neurologic complaints.  He denies any recent fevers or illnesses.  He has a good appetite and denies weight loss.  He denies any chest pain, shortness of breath, or hemoptysis.  He denies any abdominal pain.  He has no nausea, vomiting, constipation, or diarrhea.  He has no urinary complaints.  Patient offers no specific complaints today.  REVIEW OF SYSTEMS:   Review of Systems  Constitutional: Negative.  Negative for fever, malaise/fatigue and weight loss.  Respiratory: Negative.  Negative for cough, hemoptysis and shortness of breath.   Cardiovascular: Negative.  Negative for chest pain and leg swelling.  Gastrointestinal: Negative.  Negative for abdominal pain, diarrhea, nausea and vomiting.  Genitourinary: Negative.  Negative for dysuria.  Musculoskeletal: Negative.  Negative for back pain.  Skin: Negative.  Negative for rash.  Neurological: Negative.  Negative for dizziness, sensory  change, focal weakness and weakness.  Psychiatric/Behavioral: Negative.  The patient is not nervous/anxious.     As per HPI. Otherwise, a complete review of systems is negative.  PAST MEDICAL HISTORY: Past Medical History:  Diagnosis Date   Allergic rhinitis    Aortic atherosclerosis (HCC)    Benign prostatic hypertrophy with lower urinary tract symptoms (LUTS)    Bradycardia    Chest pain    a. 05/2013 MV: EF 70%, no ischemia.   Chronic respiratory failure with hypoxia (HCC)    COPD (chronic obstructive pulmonary disease) (HCC)    Coronary artery disease    COVID-19    Diabetes mellitus without complication (HCC)    last a1c 6.5 09-16-21 no meds   Diverticulitis    DIVERTICULOSIS   Dysrhythmia    Elevated LFTs    Enlarged prostate    Essential hypertension    CONTROLLED ON MEDS   Full dentures    H/O hypokalemia    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 08/28/2016   Hyperlipemia    Hypomagnesemia    IFG (impaired fasting glucose)    Liver cancer (HCC) 12/08/2016   Liver mass, left lobe 08/28/2016   Probably hemangioma; will get MRI of liver, refer to GI   Lung cancer (HCC)    a. carcinoid, left lung, Stage 1b (T2a, N0, cM0);  b. 05/2013 s/p VATS & LULobectomy.   Metabolic acidosis    Monocytosis    Osteopenia    Paroxysmal atrial flutter (HCC)    a. 03/2013->no recurrence;  b. CHA2DS2VASc = 2-->not currently on anticoagulation;  c. 05/2012 Echo: EF 55-60%, normal RV.   Pneumonia    Postinflammatory pulmonary fibrosis (HCC)    Rectal bleeding  Smoking history    Wears glasses     PAST SURGICAL HISTORY: Past Surgical History:  Procedure Laterality Date   BRONCHOSCOPY     04/06/2013   CARDIOVASCULAR STRESS TEST  05/2013   a. No evidence of ischemia or infarct, EF 70%, no WMAs   CATARACT EXTRACTION W/PHACO Left 07/14/2015   Procedure: CATARACT EXTRACTION PHACO AND INTRAOCULAR LENS PLACEMENT (IOC);  Surgeon: Lockie Mola, MD;  Location: [facility];  Service: Ophthalmology;  Laterality: Left;   CATARACT EXTRACTION W/PHACO Right 10/01/2015   Procedure: CATARACT EXTRACTION PHACO AND INTRAOCULAR LENS PLACEMENT (IOC);  Surgeon: Lockie Mola, MD;  Location: [facility];  Service: Ophthalmology;  Laterality: Right;   COLONOSCOPY W/ POLYPECTOMY     bleed after-had to go to surgery to stop bleeding via colonoscopy   COLONOSCOPY WITH PROPOFOL N/A 11/19/2015   Procedure: COLONOSCOPY WITH PROPOFOL;  Surgeon: [name], MD;  Location: [facility] END[facility]COPY;  Service: Endoscopy;  Laterality: N/A;   DUPUYTREN CONTRACTURE RELEASE  12/15/2011   Procedure: DUPUYTREN CONTRACTURE RELEASE;  Surgeon: Nicki Reaper, MD;  Location: [facility];  Service: Orthopedics;  Laterality: Left;  Fasciotomy left ring finger dupuytrens   ELECTROMAGNETIC NAVIGATION BROCHOSCOPY Left 09/19/2019   Procedure: ELECTROMAGNETIC NAVIGATION BRONCHOSCOPY;  Surgeon: Salena Saner, MD;  Location: [facility] ORS;  Service: Cardiopulmonary;  Laterality: Left;   FASCIECTOMY Right 11/07/2018   Procedure: SEGMENTAL FASCIECTOMY RIGHT RING FINGER;  Surgeon: Cindee Salt, MD;  Location: [facility];  Service: Orthopedics;  Laterality: Right;  AXILLARY BLOCK   FLEXIBLE BRONCHOSCOPY Left 11/26/2019   Procedure: FLEXIBLE BRONCHOSCOPY WITH CRYO THERAPY;  Surgeon: Salena Saner, MD;  Location: [facility];  Service: Cardiopulmonary;  Laterality: Left;   FLEXIBLE BRONCHOSCOPY Left 10/14/2021   Procedure: FLEXIBLE BRONCHOSCOPY;  Surgeon: Salena Saner, MD;  Location: [facility];  Service: Pulmonary;  Laterality: Left;   IR ANGIOGRAM SELECTIVE EACH ADDITIONAL VESSEL  07/26/2017   IR ANGIOGRAM SELECTIVE EACH ADDITIONAL VESSEL  07/26/2017   IR ANGIOGRAM SELECTIVE EACH ADDITIONAL VESSEL  07/26/2017   IR ANGIOGRAM SELECTIVE EACH ADDITIONAL VESSEL  07/26/2017   IR ANGIOGRAM SELECTIVE EACH ADDITIONAL VESSEL  07/26/2017   IR ANGIOGRAM SELECTIVE EACH ADDITIONAL  VESSEL  08/11/2017   IR ANGIOGRAM SELECTIVE EACH ADDITIONAL VESSEL  04/28/2018   IR ANGIOGRAM VISCERAL SELECTIVE  07/26/2017   IR ANGIOGRAM VISCERAL SELECTIVE  08/11/2017   IR ANGIOGRAM VISCERAL SELECTIVE  04/28/2018   IR EMBO ARTERIAL NOT HEMORR HEMANG INC GUIDE ROADMAPPING  07/26/2017   IR EMBO TUMOR ORGAN ISCHEMIA INFARCT INC GUIDE ROADMAPPING  08/11/2017   IR EMBO TUMOR ORGAN ISCHEMIA INFARCT INC GUIDE ROADMAPPING  04/28/2018   IR RADIOLOGIST EVAL & MGMT  07/07/2017   IR RADIOLOGIST EVAL & MGMT  09/07/2017   IR RADIOLOGIST EVAL & MGMT  12/21/2017   IR RADIOLOGIST EVAL & MGMT  03/22/2018   IR RADIOLOGIST EVAL & MGMT  06/08/2018   IR RADIOLOGIST EVAL & MGMT  11/01/2018   IR RADIOLOGIST EVAL & MGMT  03/21/2019   IR RADIOLOGIST EVAL & MGMT  11/08/2019   IR RADIOLOGIST EVAL & MGMT  07/01/2020   IR RADIOLOGIST EVAL & MGMT  12/18/2020   IR RADIOLOGIST EVAL & MGMT  06/18/2021   IR RADIOLOGIST EVAL & MGMT  12/14/2021   IR RADIOLOGIST EVAL & MGMT  12/31/2021   IR US GUIDE VASC ACCESS RIGHT  07/26/2017   IR US GUIDE VASC ACCESS RIGHT  08/11/2017   IR US GUIDE VASC  ACCESS RIGHT  04/28/2018   LUNG LOBECTOMY  06/10/13   upper left lung   MULTIPLE TOOTH EXTRACTIONS     REMOVAL RETAINED LENS Right 11/17/2015   Procedure: REMOVAL RETAINED LENS FROAGMENTS RIGHT EYE;  Surgeon: Lockie Mola, MD;  Location: [facility];  Service: Ophthalmology;  Laterality: Right;   TONSILLECTOMY     UPPER GASTROINTESTINAL ENDOSCOPY  11-03-15   Dr Lemar Livings   VASECTOMY     VIDEO ASSISTED THORACOSCOPY (VATS)/WEDGE RESECTION Left 06/04/2013   Procedure: VIDEO ASSISTED THORACOSCOPY (VATS)/WEDGE RESECTION;  Surgeon: Delight Ovens, MD;  Location: [facility] OR;  Service: Thoracic;  Laterality: Left;   VIDEO BRONCHOSCOPY N/A 06/04/2013   Procedure: VIDEO BRONCHOSCOPY;  Surgeon: Delight Ovens, MD;  Location: [facility] OR;  Service: Thoracic;  Laterality: N/A;   VIDEO BRONCHOSCOPY Left 08/12/2021   Procedure: VIDEO BRONCHOSCOPY WITHOUT FLUORO;   Surgeon: Salena Saner, MD;  Location: [facility] OR[facility];  Service: Cardiopulmonary;  Laterality: Left;   VIDEO BRONCHOSCOPY Left 09/09/2021   Procedure: VIDEO BRONCHOSCOPY WITHOUT FLUORO;  Surgeon: Salena Saner, MD;  Location: [facility];  Service: Cardiopulmonary;  Laterality: Left;    FAMILY HISTORY: Family History  Problem Relation Age of Onset   Stroke Mother    Alzheimer's disease Mother    Hypertension Sister    Hyperlipidemia Sister    Hypertension Brother    Hyperlipidemia Brother    Diabetes Brother     ADVANCED DIRECTIVES (Y/N):  N  HEALTH MAINTENANCE: Social History   Tobacco Use   Smoking status: Former    Packs/day: 1.00    Years: 40.00    Additional pack years: 0.00    Total pack years: 40.00    Types: Cigarettes    Quit date: 12/09/1998    Years since quitting: 23.9   Smokeless tobacco: Never  Vaping Use   Vaping Use: Never used  Substance Use Topics   Alcohol use: No   Drug use: No     Colonoscopy:  PAP:  Bone density:  Lipid panel:  Allergies  Allergen Reactions   Tape Itching    Surgical tapes     Current Outpatient Medications  Medication Sig Dispense Refill   amLODipine (NORVASC) 5 MG tablet Take 5 mg in the morning and take another 5 mg in the afternoon as needed if BP >150/100 90 tablet 1   aspirin EC 81 MG tablet Take 81 mg by mouth daily.     b complex vitamins capsule Take 1 capsule by mouth daily.     cholecalciferol (VITAMIN D3) 25 MCG (1000 UNIT) tablet Take 1,000 Units by mouth daily.     Cinnamon 500 MG capsule Take 500 mg by mouth at bedtime.     DM-APAP-CPM (CORICIDIN HBP PO) Take 2 tablets by mouth daily as needed (allergies/cold symptoms).     glucose blood (ONETOUCH VERIO) test strip USE 1 STRIP TO CHECK GLUCOSE ONCE DAILY 100 each 2   ipratropium (ATROVENT) 0.03 % nasal spray USE 2 SPRAY(S) IN EACH NOSTRIL THREE TIMES DAILY 30 mL 0   losartan (COZAAR) 100 MG tablet Take 1 tablet (100 mg total) by mouth every morning. 90  tablet 1   Multiple Vitamins-Minerals (MULTIVITAMIN WITH MINERALS) tablet Take 1 tablet by mouth daily.     ondansetron (ZOFRAN) 8 MG tablet Take 1 tablet (8 mg total) by mouth 2 (two) times daily as needed for nausea or vomiting. 20 tablet 0   polycarbophil (FIBERCON) 625 MG tablet Take 625 mg by mouth 2 (  two) times daily.     polyvinyl alcohol (LIQUIFILM TEARS) 1.4 % ophthalmic solution Place 1 drop into both eyes as needed for dry eyes.     rosuvastatin (CRESTOR) 20 MG tablet Take 1 tablet (20 mg total) by mouth daily. 90 tablet 1   vitamin C (ASCORBIC ACID) 500 MG tablet Take 500 mg by mouth daily.     zinc sulfate 220 (50 Zn) MG capsule Take 1 capsule (220 mg total) by mouth daily. 30 capsule 0   No current facility-administered medications for this visit.   Facility-Administered Medications Ordered in Other Visits  Medication Dose Route Frequency Provider Last Rate Last Admin   lanreotide acetate (SOMATULINE DEPOT) injection 120 mg  120 mg Subcutaneous Once Jeralyn Ruths, MD        OBJECTIVE: Vitals:   11/15/22 0956  BP: (!) 140/74  Pulse: (!) 58  Resp: 16  Temp: 97.9 F (36.6 C)  SpO2: 98%     Body mass index is 27.36 kg/m.    ECOG FS:0 - Asymptomatic  General: Well-developed, well-nourished, no acute distress. Eyes: Pink conjunctiva, anicteric sclera. HEENT: Normocephalic, moist mucous membranes. Lungs: No audible wheezing or coughing. Heart: Regular rate and rhythm. Abdomen: Soft, nontender, no obvious distention. Musculoskeletal: No edema, cyanosis, or clubbing. Neuro: Alert, answering all questions appropriately. Cranial nerves grossly intact. Skin: No rashes or petechiae noted. Psych: Normal affect.  LAB RESULTS:  Lab Results  Component Value Date   NA 139 11/15/2022   K 4.4 11/15/2022   CL 106 11/15/2022   CO2 26 11/15/2022   GLUCOSE 142 (H) 11/15/2022   BUN 21 11/15/2022   CREATININE 0.99 11/15/2022   CALCIUM 9.5 11/15/2022   PROT 7.0 11/15/2022    ALBUMIN 4.1 11/15/2022   AST 19 11/15/2022   ALT 17 11/15/2022   ALKPHOS 112 11/15/2022   BILITOT 0.6 11/15/2022   GFRNONAA >60 11/15/2022   GFRAA >60 01/29/2020    Lab Results  Component Value Date   WBC 8.2 11/15/2022   NEUTROABS 6.2 11/15/2022   HGB 14.8 11/15/2022   HCT 44.9 11/15/2022   MCV 92.2 11/15/2022   PLT 162 11/15/2022     STUDIES: No results found.  ONCOLOGY HISTORY: Patient's pathology, imaging, and outside facility notes reviewed extensively. Patient underwent left upper lobe lobectomy in [place] on June 04, 2013. Final pathology results revealed a well differentiated neuroendocrine tumor with positive bronchial margins. Patient did not receive adjuvant XRT or chemotherapy at that time.  His most recent Y 90 ablation to his liver occurred on April 28, 2018.   CT scan results from December 28, 2021  with 2 new foci noted in his liver likely consistent with progressive disease.  Patient subsequently underwent 4 treatments with Lutathera completing on August 04, 2022.  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  Despite treatment with Lutathera, patient had no appreciable change in his disease burden and potentially a new focus in his liver.  He continues to follow-up with interventional radiology and has a planned Y90 embolization in August 2024.  Proceed with lanreotide today.  Return to clinic in 4 weeks for laboratory work and treatment only.  Patient will then return to clinic in 8 weeks for laboratory work, further evaluation, and continuation of treatment.  Left mainstem endobronchial mass: CT scan results as above.  Consistent with  neuroendocrine tumor.  No plans for pulmonary intervention at this time.  Continue follow-up with them as scheduled.  I spent a total of 30 minutes reviewing chart data, face-to-face  evaluation with the patient, counseling and coordination of care as detailed above.   Patient expressed understanding and was in agreement with this plan. He also understands that He can call clinic at any time with any questions, concerns, or complaints.    Cancer Staging  Neuroendocrine carcinoma of lung [facility]) Staging form: Lung, AJCC 8th Edition - Clinical stage from 12/28/2016: Stage IVA (cT2a, cN0, cM1b) - Signed by Jeralyn Ruths, MD on 12/28/2016 Sites of metastasis: Liver   Jeralyn Ruths, MD   11/15/2022 1:48 PM

## 2022-11-16 LAB — CHROMOGRANIN A: Chromogranin A (ng/mL): 2047 ng/mL — ABNORMAL HIGH (ref 0.0–101.8)

## 2022-11-22 ENCOUNTER — Other Ambulatory Visit (HOSPITAL_COMMUNITY): Payer: Self-pay | Admitting: Interventional Radiology

## 2022-11-22 DIAGNOSIS — C7B8 Other secondary neuroendocrine tumors: Secondary | ICD-10-CM

## 2022-11-23 DIAGNOSIS — D044 Carcinoma in situ of skin of scalp and neck: Secondary | ICD-10-CM | POA: Diagnosis not present

## 2022-11-23 DIAGNOSIS — C4442 Squamous cell carcinoma of skin of scalp and neck: Secondary | ICD-10-CM | POA: Diagnosis not present

## 2022-12-02 ENCOUNTER — Other Ambulatory Visit: Payer: Self-pay | Admitting: Radiology

## 2022-12-02 DIAGNOSIS — C7B8 Other secondary neuroendocrine tumors: Secondary | ICD-10-CM

## 2022-12-04 ENCOUNTER — Encounter (HOSPITAL_COMMUNITY): Payer: Self-pay

## 2022-12-04 NOTE — H&P (Signed)
Chief Complaint: Neuroendocrine tumor with metastases to liver. Found to have new right hepatic lesions. Patient presents for pre Y90 Mapping  Referring Physician(s): Dr. Orlie Dakin  Supervising Physician: Ruel Favors  Patient Status: [facility] - Out-pt  History of Present Illness: Jason Malone is a 82 y.o. male outpatient. Known to the IR Service. History of pulmonary fibrosis, a flutter, HLD, HTN, DM. Stage IV metastatic neuroendocrine tumor with metastases to the bone and liver. S/p Lutathera. Found to have elevated chromogranin A. increased hypermetabolic activity to the known bone metastases and new hepatic lesions. PET scan from 5.23.24 reads Metastasis within the posterior right lobe of liver is mildly increased in size and degree of tracer uptake. Lesion arising off the lateral segment of left hepatic lobe is stable in size with mildly decreased tracer uptake compared with the previous exam. MR Abd from 6.6.24 reads Metastasis within the posterior right lobe of liver is mildly increased in size and degree of tracer uptake. Lesion arising off the lateral segment of left hepatic lobe is stable in size with mildly decreased tracer uptake compared with the previous exam. The Patient was seen for consultation in the Interventional Radiology Clinic  on 6.19.24 with IR Attending Dr. Ruel Favors. At that time a detailed discussion regarding the Patient's medical condition including but not limited to possible treatment options took place. Following that discussion the Patient elected to proceed with Pre90 mapping. The Patient presents today for repeat Y90 radio embolization to the right lobe. This would require repeat arterial mapping and hepatopulmonary shunt calculation since it has been 5 years since his previous Y90 treatment .   Past Medical History:  Diagnosis Date   Allergic rhinitis    Aortic atherosclerosis (HCC)    Benign prostatic hypertrophy with lower urinary tract symptoms (LUTS)     Bradycardia    Chest pain    a. 05/2013 MV: EF 70%, no ischemia.   Chronic respiratory failure with hypoxia (HCC)    COPD (chronic obstructive pulmonary disease) (HCC)    Coronary artery disease    COVID-19    Diabetes mellitus without complication (HCC)    last a1c 6.5 09-16-21 no meds   Diverticulitis    DIVERTICULOSIS   Dysrhythmia    Elevated LFTs    Enlarged prostate    Essential hypertension    CONTROLLED ON MEDS   Full dentures    H/O hypokalemia    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 08/28/2016   Hyperlipemia    Hypomagnesemia    IFG (impaired fasting glucose)    Liver cancer (HCC) 12/08/2016   Liver mass, left lobe 08/28/2016   Probably hemangioma; will get MRI of liver, refer to GI   Lung cancer (HCC)    a. carcinoid, left lung, Stage 1b (T2a, N0, cM0);  b. 05/2013 s/p VATS & LULobectomy.   Metabolic acidosis    Monocytosis    Osteopenia    Paroxysmal atrial flutter (HCC)    a. 03/2013->no recurrence;  b. CHA2DS2VASc = 2-->not currently on anticoagulation;  c. 05/2012 Echo: EF 55-60%, normal RV.   Pneumonia    Postinflammatory pulmonary fibrosis (HCC)    Rectal bleeding    Smoking history    Wears glasses     Past Surgical History:  Procedure Laterality Date   BRONCHOSCOPY     04/06/2013   CARDIOVASCULAR STRESS TEST  05/2013   a. No evidence of ischemia or infarct, EF 70%, no WMAs   CATARACT EXTRACTION W/PHACO  Left 07/14/2015   Procedure: CATARACT EXTRACTION PHACO AND INTRAOCULAR LENS PLACEMENT (IOC);  Surgeon: Lockie Mola, MD;  Location: [facility];  Service: Ophthalmology;  Laterality: Left;   CATARACT EXTRACTION W/PHACO Right 10/01/2015   Procedure: CATARACT EXTRACTION PHACO AND INTRAOCULAR LENS PLACEMENT (IOC);  Surgeon: Lockie Mola, MD;  Location: [facility];  Service: Ophthalmology;  Laterality: Right;   COLONOSCOPY W/ POLYPECTOMY     bleed after-had to go to surgery to stop bleeding via colonoscopy    COLONOSCOPY WITH PROPOFOL N/A 11/19/2015   Procedure: COLONOSCOPY WITH PROPOFOL;  Surgeon: [name], MD;  Location: [facility];  Service: Endoscopy;  Laterality: N/A;   DUPUYTREN CONTRACTURE RELEASE  12/15/2011   Procedure: DUPUYTREN CONTRACTURE RELEASE;  Surgeon: Nicki Reaper, MD;  Location: [facility];  Service: Orthopedics;  Laterality: Left;  Fasciotomy left ring finger dupuytrens   ELECTROMAGNETIC NAVIGATION BROCHOSCOPY Left 09/19/2019   Procedure: ELECTROMAGNETIC NAVIGATION BRONCHOSCOPY;  Surgeon: Salena Saner, MD;  Location: [facility];  Service: Cardiopulmonary;  Laterality: Left;   FASCIECTOMY Right 11/07/2018   Procedure: SEGMENTAL FASCIECTOMY RIGHT RING FINGER;  Surgeon: Cindee Salt, MD;  Location: [facility];  Service: Orthopedics;  Laterality: Right;  AXILLARY BLOCK   FLEXIBLE BRONCHOSCOPY Left 11/26/2019   Procedure: FLEXIBLE BRONCHOSCOPY WITH CRYO THERAPY;  Surgeon: Salena Saner, MD;  Location: [facility];  Service: Cardiopulmonary;  Laterality: Left;   FLEXIBLE BRONCHOSCOPY Left 10/14/2021   Procedure: FLEXIBLE BRONCHOSCOPY;  Surgeon: Salena Saner, MD;  Location: [facility];  Service: Pulmonary;  Laterality: Left;   IR ANGIOGRAM SELECTIVE EACH ADDITIONAL VESSEL  07/26/2017   IR ANGIOGRAM SELECTIVE EACH ADDITIONAL VESSEL  07/26/2017   IR ANGIOGRAM SELECTIVE EACH ADDITIONAL VESSEL  07/26/2017   IR ANGIOGRAM SELECTIVE EACH ADDITIONAL VESSEL  07/26/2017   IR ANGIOGRAM SELECTIVE EACH ADDITIONAL VESSEL  07/26/2017   IR ANGIOGRAM SELECTIVE EACH ADDITIONAL VESSEL  08/11/2017   IR ANGIOGRAM SELECTIVE EACH ADDITIONAL VESSEL  04/28/2018   IR ANGIOGRAM VISCERAL SELECTIVE  07/26/2017   IR ANGIOGRAM VISCERAL SELECTIVE  08/11/2017   IR ANGIOGRAM VISCERAL SELECTIVE  04/28/2018   IR EMBO ARTERIAL NOT HEMORR HEMANG INC GUIDE ROADMAPPING  07/26/2017   IR EMBO TUMOR ORGAN ISCHEMIA INFARCT INC GUIDE ROADMAPPING  08/11/2017   IR EMBO TUMOR ORGAN ISCHEMIA INFARCT  INC GUIDE ROADMAPPING  04/28/2018   IR RADIOLOGIST EVAL & MGMT  07/07/2017   IR RADIOLOGIST EVAL & MGMT  09/07/2017   IR RADIOLOGIST EVAL & MGMT  12/21/2017   IR RADIOLOGIST EVAL & MGMT  03/22/2018   IR RADIOLOGIST EVAL & MGMT  06/08/2018   IR RADIOLOGIST EVAL & MGMT  11/01/2018   IR RADIOLOGIST EVAL & MGMT  03/21/2019   IR RADIOLOGIST EVAL & MGMT  11/08/2019   IR RADIOLOGIST EVAL & MGMT  07/01/2020   IR RADIOLOGIST EVAL & MGMT  12/18/2020   IR RADIOLOGIST EVAL & MGMT  06/18/2021   IR RADIOLOGIST EVAL & MGMT  12/14/2021   IR RADIOLOGIST EVAL & MGMT  12/31/2021   IR US GUIDE VASC ACCESS RIGHT  07/26/2017   IR US GUIDE VASC ACCESS RIGHT  08/11/2017   IR US GUIDE VASC ACCESS RIGHT  04/28/2018   LUNG LOBECTOMY  06/10/13   upper left lung   MULTIPLE TOOTH EXTRACTIONS     REMOVAL RETAINED LENS Right 11/17/2015   Procedure: REMOVAL RETAINED LENS FROAGMENTS RIGHT EYE;  Surgeon: Lockie Mola, MD;  Location: [facility];  Service: Ophthalmology;  Laterality: Right;  TONSILLECTOMY     UPPER GASTROINTESTINAL ENDOSCOPY  11-03-15   Dr Lemar Livings   VASECTOMY     VIDEO ASSISTED THORACOSCOPY (VATS)/WEDGE RESECTION Left 06/04/2013   Procedure: VIDEO ASSISTED THORACOSCOPY (VATS)/WEDGE RESECTION;  Surgeon: Delight Ovens, MD;  Location: [facility] OR;  Service: Thoracic;  Laterality: Left;   VIDEO BRONCHOSCOPY N/A 06/04/2013   Procedure: VIDEO BRONCHOSCOPY;  Surgeon: Delight Ovens, MD;  Location: [facility];  Service: Thoracic;  Laterality: N/A;   VIDEO BRONCHOSCOPY Left 08/12/2021   Procedure: VIDEO BRONCHOSCOPY WITHOUT FLUORO;  Surgeon: Salena Saner, MD;  Location: [facility];  Service: Cardiopulmonary;  Laterality: Left;   VIDEO BRONCHOSCOPY Left 09/09/2021   Procedure: VIDEO BRONCHOSCOPY WITHOUT FLUORO;  Surgeon: Salena Saner, MD;  Location: [facility];  Service: Cardiopulmonary;  Laterality: Left;    Allergies: Tape  Medications: Prior to Admission medications   Medication Sig Start Date End Date  Taking? Authorizing Provider  amLODipine (NORVASC) 5 MG tablet Take 5 mg in the morning and take another 5 mg in the afternoon as needed if BP >150/100 09/03/22   Margarita Mail, DO  aspirin EC 81 MG tablet Take 81 mg by mouth daily.    [provider]  b complex vitamins capsule Take 1 capsule by mouth daily.    [provider]  cholecalciferol (VITAMIN D3) 25 MCG (1000 UNIT) tablet Take 1,000 Units by mouth daily.    [provider]  Cinnamon 500 MG capsule Take 500 mg by mouth at bedtime.    [provider]  DM-APAP-CPM (CORICIDIN HBP PO) Take 2 tablets by mouth daily as needed (allergies/cold symptoms).    [provider]  glucose blood (ONETOUCH VERIO) test strip USE 1 STRIP TO CHECK GLUCOSE ONCE DAILY 08/24/22   Margarita Mail, DO  ipratropium (ATROVENT) 0.03 % nasal spray USE 2 SPRAY(S) IN [facility] NOSTRIL THREE TIMES DAILY 09/20/22   Salena Saner, MD  losartan (COZAAR) 100 MG tablet Take 1 tablet (100 mg total) by mouth every morning. 07/01/22   Margarita Mail, DO  Multiple Vitamins-Minerals (MULTIVITAMIN WITH MINERALS) tablet Take 1 tablet by mouth daily.    [provider]  ondansetron (ZOFRAN) 8 MG tablet Take 1 tablet (8 mg total) by mouth 2 (two) times daily as needed for nausea or vomiting. 06/09/22   Lorna Few, MD  polycarbophil (FIBERCON) 625 MG tablet Take 625 mg by mouth 2 (two) times daily.    [provider]  polyvinyl alcohol (LIQUIFILM TEARS) 1.4 % ophthalmic solution Place 1 drop into both eyes as needed for dry eyes.    [provider]  rosuvastatin (CRESTOR) 20 MG tablet Take 1 tablet (20 mg total) by mouth daily. 10/26/22   Margarita Mail, DO  vitamin C (ASCORBIC ACID) 500 MG tablet Take 500 mg by mouth daily.    [provider]  zinc sulfate 220 (50 Zn) MG capsule Take 1 capsule (220 mg total) by mouth daily. 06/18/19   Drema Dallas, MD     Family History  Problem Relation Age  of Onset   Stroke Mother    Alzheimer's disease Mother    Hypertension Sister    Hyperlipidemia Sister    Hypertension Brother    Hyperlipidemia Brother    Diabetes Brother     Social History   Socioeconomic History   Marital status: Widowed    Spouse name: Talbert Forest   Number of children: 3   Years of education: Not on file   Highest education  level: High school graduate  Occupational History   Not on file  Tobacco Use   Smoking status: Former    Current packs/day: 0.00    Average packs/day: 1 pack/day for 40.0 years (40.0 ttl pk-yrs)    Types: Cigarettes    Start date: 12/09/1958    Quit date: 12/09/1998    Years since quitting: 24.0   Smokeless tobacco: Never  Vaping Use   Vaping status: Never Used  Substance and Sexual Activity   Alcohol use: No   Drug use: No   Sexual activity: Yes    Partners: Female  Other Topics Concern   Not on file  Social History Narrative   Not on file   Social Determinants of Health   Financial Resource Strain: Low Risk  (06/12/2018)   Overall Financial Resource Strain (CARDIA)    Difficulty of Paying Living Expenses: Not hard at all  Food Insecurity: No Food Insecurity (06/12/2018)   Hunger Vital Sign    Worried About Running Out of Food in the Last Year: Never true    Ran Out of Food in the Last Year: Never true  Transportation Needs: No Transportation Needs (06/12/2018)   PRAPARE - Administrator, Civil Service (Medical): No    Lack of Transportation (Non-Medical): No  Physical Activity: Sufficiently Active (06/12/2018)   Exercise Vital Sign    Days of Exercise per Week: 7 days    Minutes of Exercise per Session: 60 min  Stress: No Stress Concern Present (06/12/2018)   [employer] of Occupational Health - Occupational Stress Questionnaire    Feeling of Stress : Not at all  Social Connections: Somewhat Isolated (06/12/2018)   Social Connection and Isolation Panel [NHANES]    Frequency of Communication with Friends and  Family: More than three times a week    Frequency of Social Gatherings with Friends and Family: More than three times a week    Attends Religious Services: Never    Database administrator or Organizations: No    Attends Banker Meetings: Never    Marital Status: Married     Review of Systems: A 12 point ROS discussed and pertinent positives are indicated in the HPI above.  All other systems are negative.  Review of Systems  Constitutional:  Negative for fever.  HENT:  Negative for congestion.   Respiratory:  Negative for cough and shortness of breath.   Cardiovascular:  Negative for chest pain.  Gastrointestinal:  Negative for abdominal pain.  Neurological:  Negative for headaches.  Psychiatric/Behavioral:  Negative for behavioral problems and confusion.     Vital Signs: BP (!) 169/79   Pulse (!) 58   Temp 97.8 F (36.6 C) (Oral)   Resp 16   Ht 5\' 4"  (1.626 m)   Wt 154 lb (69.9 kg)   SpO2 98%   BMI 26.43 kg/m   Physical Exam Vitals and nursing note reviewed.  Constitutional:      Appearance: He is well-developed.  HENT:     Head: Normocephalic.     Comments: Staples noted to the dome of his head where recent excision occurred.  Cardiovascular:     Rate and Rhythm: Regular rhythm. Bradycardia present.     Heart sounds: Normal heart sounds.  Pulmonary:     Effort: Pulmonary effort is normal.     Breath sounds: Normal breath sounds.  Musculoskeletal:        General: Normal range of motion.     Cervical  back: Normal range of motion.  Skin:    General: Skin is warm and dry.  Neurological:     Mental Status: He is alert and oriented to person, place, and time.  Psychiatric:        Mood and Affect: Mood normal.        Behavior: Behavior normal.        Thought Content: Thought content normal.        Judgment: Judgment normal.     Imaging: No results found.  Labs:  CBC: Recent Labs    06/09/22 0821 08/04/22 0833 10/15/22 1052 11/15/22 0950   WBC 8.4 7.6 6.8 8.2  HGB 16.1 16.0 15.0 14.8  HCT 49.4 48.7 45.4 44.9  PLT 153 146* 177 162    COAGS: No results for input(s): "INR", "APTT" in the last 8760 hours.  BMP: Recent Labs    06/09/22 0821 08/04/22 0833 10/15/22 1052 11/15/22 0950  NA 140 140 142 139  K 3.8 4.0 4.2 4.4  CL 104 106 109 106  CO2 25 26 25 26   GLUCOSE 146* 140* 119* 142*  BUN 18 21 22 21   CALCIUM 9.7 9.2 9.5 9.5  CREATININE 0.73 1.01 0.85 0.99  GFRNONAA >60 >60 >60 >60    LIVER FUNCTION TESTS: Recent Labs    06/09/22 0821 08/04/22 0833 10/15/22 1052 11/15/22 0950  BILITOT 0.9 0.8 1.0 0.6  AST 29 34 25 19  ALT 23 36 20 17  ALKPHOS 131* 119 139* 112  PROT 7.7 6.6 7.0 7.0  ALBUMIN 4.4 4.2 4.0 4.1     Assessment and Plan:  82 y.o. male outpatient. Known to the IR Service. History of pulmonary fibrosis, a flutter, HLD, HTN, DM. Stage IV metastatic neuroendocrine tumor with metastases to the bone and liver. S/p Lutathera. Found to have elevated chromogranin A. increased hypermetabolic activity to the known bone metastases and new hepatic lesions. PET scan from 5.23.24 reads Metastasis within the posterior right lobe of liver is mildly increased in size and degree of tracer uptake. Lesion arising off the lateral segment of left hepatic lobe is stable in size with mildly decreased tracer uptake compared with the previous exam. MR Abd from 6.6.24 reads Metastasis within the posterior right lobe of liver is mildly increased in size and degree of tracer uptake. Lesion arising off the lateral segment of left hepatic lobe is stable in size with mildly decreased tracer uptake compared with the previous exam. The Patient was seen for consultation in the Interventional Radiology Clinic  on 6.19.24 with IR Attending Dr. Ruel Favors. At that time a detailed discussion regarding the Patient's medical condition including but not limited to possible treatment options took place. Following that discussion the  Patient elected to proceed with Pre Y90 Mapping. The Patient presents today for repeat Y90 radio embolization to the right lobe. This would require repeat arterial mapping and hepatopulmonary shunt calculation since it has been 5 years since his previous Y90 treatment .   IR previously performed  Liver biopsy on 8.13.18. MAA with gastroduodenal artery micro coil embolization on 3.19.19. left hepatic Y 90 radio embolization on 4.4.19.  And a right hepatic Y 90 radio embolization on 12.20.19.  Labs pending. All medications are within acceptable parameters. Allergies include tape. Patient has been NPO since midnight.   Risks and benefits discussed with the patient including, but not limited to bleeding, infection, vascular injury, post procedural pain, nausea, vomiting and fatigue, contrast induced renal failure, liver failure, radiation injury to  the bowel, radiation induced cholecystitis, neutropenia and possible need for additional procedures.  All of the patient's questions were answered, patient is agreeable to proceed. Consent signed and in chart.     Thank you for this interesting consult.  I greatly enjoyed meeting Yorel W Sartwell and look forward to participating in their care.  A copy of this report was sent to the requesting provider on this date.  Electronically Signed: Alene Mires, NP 12/06/2022, 8:09 AM   I spent a total of    25 Minutes in face to face in clinical consultation, greater than 50% of which was counseling/coordinating care for Pre Y90

## 2022-12-06 ENCOUNTER — Ambulatory Visit (HOSPITAL_COMMUNITY)
Admission: RE | Admit: 2022-12-06 | Discharge: 2022-12-06 | Disposition: A | Discharge status: Home / Self Care | Payer: Medicare Other | Source: Ambulatory Visit | Attending: Interventional Radiology | Admitting: Interventional Radiology

## 2022-12-06 ENCOUNTER — Ambulatory Visit (HOSPITAL_COMMUNITY): Admission: RE | Admit: 2022-12-06 | Payer: Medicare Other | Source: Ambulatory Visit

## 2022-12-06 ENCOUNTER — Encounter (HOSPITAL_COMMUNITY): Payer: Self-pay

## 2022-12-06 ENCOUNTER — Other Ambulatory Visit: Payer: Self-pay

## 2022-12-06 ENCOUNTER — Other Ambulatory Visit (HOSPITAL_COMMUNITY): Payer: Self-pay | Admitting: Interventional Radiology

## 2022-12-06 ENCOUNTER — Encounter (HOSPITAL_COMMUNITY)
Admission: RE | Admit: 2022-12-06 | Discharge: 2022-12-06 | Disposition: A | Discharge status: Home / Self Care | Payer: Medicare Other | Source: Ambulatory Visit | Attending: Interventional Radiology | Admitting: Interventional Radiology

## 2022-12-06 DIAGNOSIS — C787 Secondary malignant neoplasm of liver and intrahepatic bile duct: Secondary | ICD-10-CM | POA: Diagnosis not present

## 2022-12-06 DIAGNOSIS — C7951 Secondary malignant neoplasm of bone: Secondary | ICD-10-CM | POA: Insufficient documentation

## 2022-12-06 DIAGNOSIS — I10 Essential (primary) hypertension: Secondary | ICD-10-CM | POA: Insufficient documentation

## 2022-12-06 DIAGNOSIS — E119 Type 2 diabetes mellitus without complications: Secondary | ICD-10-CM | POA: Diagnosis not present

## 2022-12-06 DIAGNOSIS — C7A1 Malignant poorly differentiated neuroendocrine tumors: Secondary | ICD-10-CM | POA: Diagnosis not present

## 2022-12-06 DIAGNOSIS — C7B8 Other secondary neuroendocrine tumors: Secondary | ICD-10-CM

## 2022-12-06 DIAGNOSIS — J8410 Pulmonary fibrosis, unspecified: Secondary | ICD-10-CM | POA: Insufficient documentation

## 2022-12-06 DIAGNOSIS — C7A8 Other malignant neuroendocrine tumors: Secondary | ICD-10-CM | POA: Insufficient documentation

## 2022-12-06 DIAGNOSIS — I4892 Unspecified atrial flutter: Secondary | ICD-10-CM | POA: Diagnosis not present

## 2022-12-06 DIAGNOSIS — Z87891 Personal history of nicotine dependence: Secondary | ICD-10-CM | POA: Diagnosis not present

## 2022-12-06 DIAGNOSIS — E785 Hyperlipidemia, unspecified: Secondary | ICD-10-CM | POA: Diagnosis not present

## 2022-12-06 HISTORY — PX: IR ANGIOGRAM VISCERAL SELECTIVE: IMG657

## 2022-12-06 HISTORY — PX: IR ANGIOGRAM SELECTIVE EACH ADDITIONAL VESSEL: IMG667

## 2022-12-06 HISTORY — PX: IR US GUIDE VASC ACCESS RIGHT: IMG2390

## 2022-12-06 HISTORY — PX: IR EMBO ARTERIAL NOT HEMORR HEMANG INC GUIDE ROADMAPPING: IMG5448

## 2022-12-06 LAB — CBC WITH DIFFERENTIAL/PLATELET
Abs Immature Granulocytes: 0.05 10*3/uL (ref 0.00–0.07)
Basophils Absolute: 0.1 10*3/uL (ref 0.0–0.1)
Basophils Relative: 1 %
Eosinophils Absolute: 0.1 10*3/uL (ref 0.0–0.5)
Eosinophils Relative: 1 %
HCT: 46 % (ref 39.0–52.0)
Hemoglobin: 14.7 g/dL (ref 13.0–17.0)
Immature Granulocytes: 1 %
Lymphocytes Relative: 7 %
Lymphs Abs: 0.7 10*3/uL (ref 0.7–4.0)
MCH: 30.2 pg (ref 26.0–34.0)
MCHC: 32 g/dL (ref 30.0–36.0)
MCV: 94.7 fL (ref 80.0–100.0)
Monocytes Absolute: 1.6 10*3/uL — ABNORMAL HIGH (ref 0.1–1.0)
Monocytes Relative: 16 %
Neutro Abs: 7.7 10*3/uL (ref 1.7–7.7)
Neutrophils Relative %: 74 %
Platelets: 150 10*3/uL (ref 150–400)
RBC: 4.86 MIL/uL (ref 4.22–5.81)
RDW: 14.1 % (ref 11.5–15.5)
WBC: 10.2 10*3/uL (ref 4.0–10.5)
nRBC: 0 % (ref 0.0–0.2)

## 2022-12-06 LAB — COMPREHENSIVE METABOLIC PANEL
ALT: 22 U/L (ref 0–44)
AST: 24 U/L (ref 15–41)
Albumin: 3.9 g/dL (ref 3.5–5.0)
Alkaline Phosphatase: 113 U/L (ref 38–126)
Anion gap: 8 (ref 5–15)
BUN: 18 mg/dL (ref 8–23)
CO2: 25 mmol/L (ref 22–32)
Calcium: 9.3 mg/dL (ref 8.9–10.3)
Chloride: 106 mmol/L (ref 98–111)
Creatinine, Ser: 0.89 mg/dL (ref 0.61–1.24)
GFR, Estimated: >60 mL/min (ref >60)
Glucose, Bld: 126 mg/dL — ABNORMAL HIGH (ref 70–99)
Potassium: 4.3 mmol/L (ref 3.5–5.1)
Sodium: 139 mmol/L (ref 135–145)
Total Bilirubin: 0.9 mg/dL (ref 0.3–1.2)
Total Protein: 6.7 g/dL (ref 6.5–8.1)

## 2022-12-06 LAB — PROTIME-INR
INR: 1 (ref 0.8–1.2)
Prothrombin Time: 13.4 seconds (ref 11.4–15.2)

## 2022-12-06 LAB — GLUCOSE, CAPILLARY: Glucose-Capillary: 112 mg/dL — ABNORMAL HIGH (ref 70–99)

## 2022-12-06 MED ORDER — MIDAZOLAM HCL 2 MG/2ML IJ SOLN
INTRAMUSCULAR | Status: AC
  Filled 2022-12-06: qty 4

## 2022-12-06 MED ORDER — IOHEXOL 300 MG/ML  SOLN
100.0000 mL | Freq: Once | INTRAMUSCULAR | Status: AC | PRN
  Administered 2022-12-06: 50 mL via INTRA_ARTERIAL

## 2022-12-06 MED ORDER — FENTANYL CITRATE (PF) 100 MCG/2ML IJ SOLN
INTRAMUSCULAR | Status: AC
  Filled 2022-12-06: qty 4

## 2022-12-06 MED ORDER — LIDOCAINE HCL 1 % IJ SOLN
INTRAMUSCULAR | Status: AC
  Filled 2022-12-06: qty 20

## 2022-12-06 MED ORDER — TECHNETIUM TO 99M ALBUMIN AGGREGATED
4.3000 | Freq: Once | INTRAVENOUS | Status: AC
  Administered 2022-12-06: 4.3 via INTRAVENOUS

## 2022-12-06 MED ORDER — IOHEXOL 300 MG/ML  SOLN
100.0000 mL | Freq: Once | INTRAMUSCULAR | Status: AC | PRN
  Administered 2022-12-06: 60 mL via INTRA_ARTERIAL

## 2022-12-06 MED ORDER — FENTANYL CITRATE (PF) 100 MCG/2ML IJ SOLN
INTRAMUSCULAR | Status: AC | PRN
  Administered 2022-12-06: 50 ug via INTRAVENOUS

## 2022-12-06 MED ORDER — MIDAZOLAM HCL 2 MG/2ML IJ SOLN
INTRAMUSCULAR | Status: AC | PRN
  Administered 2022-12-06: 1 mg via INTRAVENOUS

## 2022-12-06 MED ORDER — SODIUM CHLORIDE 0.9 % IV SOLN: INTRAVENOUS | Status: DC

## 2022-12-06 NOTE — Progress Notes (Signed)
Pt disconnected from unit monitor, IV discontinued. Pt changed to regular clothes independently and tolerated well. Right groin remains level 0 and dressing changed to bandaid as ordered. Lower distal pulses palpable. Pt transported to main entrance via wheelchair accompanied by RN for discharge. No s/s of distress at this time.

## 2022-12-06 NOTE — Procedures (Signed)
Interventional Radiology Procedure Note  Procedure: celiac angio and rt hepatic TC MAA injection for shunt calc.  Pre y90 eval    Complications: None  Estimated Blood Loss:  min  Findings: Full report in pacs     Sharen Counter, MD

## 2022-12-06 NOTE — Discharge Instructions (Signed)
Please call Interventional Radiology clinic [phone] with any questions or concerns.  You may remove your dressing and shower tomorrow.    Hepatic Artery Radioembolization, Care After The following information offers guidance on how to care for yourself after your procedure. Your health care provider may also give you more specific instructions. If you have problems or questions, contact your health care provider. What can I expect after the procedure? After the procedure, it is possible to have: A slight fever for 7 to 10 days. This may be accompanied by pain, nausea, or vomiting, which is referred to as post-embolization syndrome. You may be given medicine to help relieve these symptoms. If your fever gets worse, tell your health care provider. Tiredness (fatigue). Loss of appetite. This should gradually improve after about 1 week. Abdominal pain on your right side. Soreness and tenderness in your groin area where the needle and catheter were placed (puncture site).  Follow these instructions at home:  Puncture site care  Follow instructions from your health care provider about how to take care of the puncture site. Make sure you: Wash your hands with soap and water for at least 20 seconds before and after you change your bandage (dressing). If soap and water are not available, use hand sanitizer. Change your dressing as told by your health care provider. Check your puncture site every day for signs of infection. Check for: More redness, swelling, or pain. Fluid or blood. Warmth. Pus or a bad smell. Activity Rest as told by your health care provider. Return to your normal activities as told by your health care provider. Ask your health care provider what activities are safe for you. Avoid sitting for a long time without moving. Get up to take short walks every 1-2 hours. This is important to improve blood flow and breathing. Ask for help if you feel weak or unsteady. If you were  given a sedative during the procedure, it can affect you for several hours. Do not drive or operate machinery until your health care provider says that it is safe. Do not lift anything that is heavier than 10 lb (4.5 kg), or the limit that you are told, until your health care provider says that it is safe. Medicines Take over-the-counter and prescription medicines only as told by your health care provider. Ask your health care provider if the medicine prescribed to you: Requires you to avoid driving or using machinery. Can cause constipation. You may need to take these actions to prevent or treat constipation: Drink enough fluid to keep your urine pale yellow. Take over-the-counter or prescription medicines. Eat foods that are high in fiber, such as beans, whole grains, and fresh fruits and vegetables. Limit foods that are high in fat and processed sugars, such as fried or sweet foods. General instructions Eat frequent, small meals until your appetite returns. Follow instructions from your health care provider about eating or drinking restrictions. Do not take baths, swim, or use a hot tub until your health care provider approves. You may take showers. Wash your puncture site with mild soap and water, and pat the area dry. Wear compression stockings as told by your health care provider. These stockings help to prevent blood clots and reduce swelling in your legs. Keep all follow-up visits. This is important. You may need to have blood tests and imaging tests. Contact a health care provider if: You have any of these signs of infection: More redness, swelling, or pain around your puncture site. Fluid or blood   coming from your puncture site. Warmth coming from your puncture site. Pus or a bad smell coming from your puncture site. You have pain that: Gets worse. Does not get better with medicine. Feels like very bad heartburn. Is in the middle of your abdomen, above your belly button. You have  any signs of infection or liver failure, such as: Your skin or the white parts of your eyes turn yellow (jaundice). The color of your urine changes to dark brown. The color of your stool (feces) changes to light yellow. Your abdominal measurement (girth) increases in a short period of time. You gain more than 5 lb (2.3 kg) in a short period of time. Get help right away if: You have a fever that lasts more than 10 days or is higher than what your health care provider told you to expect. You develop any of the following in your legs: Pain. Swelling. Skin that is cold or pale or turns blue. You have chest pain. You have blood in your vomit, saliva, or stool. You have trouble breathing. These symptoms may represent a serious problem that is an emergency. Do not wait to see if the symptoms will go away. Get medical help right away. Call your local emergency services (911 in the [place] Do not drive yourself to the hospital. Summary After the procedure, it is possible to have a slight fever for up to 7-10 days, tiredness, loss of appetite, abdominal pain on the right side, and groin tenderness where the catheter was placed. Do not come in close contact with people for up to a week after your procedure, as told by your health care provider. Follow instructions from your health care provider about how to take care of the puncture site. Contact a health care provider if you have any signs of infection. Get help right away if you develop pain or swelling in your legs or if your legs feel cool or look pale. This information is not intended to replace advice given to you by your health care provider. Make sure you discuss any questions you have with your health care provider. Document Revised: 03/30/2020 Document Reviewed: 03/30/2020 [employer] Patient Education  2022 [employer]      Moderate Conscious Sedation, Adult, Care After This sheet gives you information about how to care for yourself  after your procedure. Your health care provider may also give you more specific instructions. If you have problems or questions, contact your health care provider. What can I expect after the procedure? After the procedure, it is common to have: Sleepiness for several hours. Impaired judgment for several hours. Difficulty with balance. Vomiting if you eat too soon. Follow these instructions at home: For the time period you were told by your health care provider: Rest. Do not participate in activities where you could fall or become injured. Do not drive or use machinery. Do not drink alcohol. Do not take sleeping pills or medicines that cause drowsiness. Do not make important decisions or sign legal documents. Do not take care of children on your own.      Eating and drinking Follow the diet recommended by your health care provider. Drink enough fluid to keep your urine pale yellow. If you vomit: Drink water, juice, or soup when you can drink without vomiting. Make sure you have little or no nausea before eating solid foods.   General instructions Take over-the-counter and prescription medicines only as told by your health care provider. Have a responsible adult stay with you   for the time you are told. It is important to have someone help care for you until you are awake and alert. Do not smoke. Keep all follow-up visits as told by your health care provider. This is important. Contact a health care provider if: You are still sleepy or having trouble with balance after 24 hours. You feel light-headed. You keep feeling nauseous or you keep vomiting. You develop a rash. You have a fever. You have redness or swelling around the IV site. Get help right away if: You have trouble breathing. You have new-onset confusion at home. Summary After the procedure, it is common to feel sleepy, have impaired judgment, or feel nauseous if you eat too soon. Rest after you get home. Know the  things you should not do after the procedure. Follow the diet recommended by your health care provider and drink enough fluid to keep your urine pale yellow. Get help right away if you have trouble breathing or new-onset confusion at home. This information is not intended to replace advice given to you by your health care provider. Make sure you discuss any questions you have with your health care provider. Document Revised: 08/24/2019 Document Reviewed: 03/22/2019 [employer] Patient Education  2021 [employer]   AFTER YOUR NEXT PROCEDURE  Radiation precautions For up to a week after your procedure, there will be a small amount of radioactivity near your liver. This is not especially dangerous to other people. However, as told by your health care provider, you should follow these precautions for 7 days: Do not come in close contact with people. Do not sleep in the same bed as someone else. Do not hold children or babies. Do not have contact with pregnant women.  Post Y-90 Radioembolization Discharge Instructions  You have been given a radioactive material during your procedure.  While it is safe for you to be discharged home from the hospital, you need to proceed directly home.    Do not use public transportation, including air travel, lasting more than 2 hours for 1 week.  Avoid crowded public places for 1 week.  Adult visitors should try to avoid close contact with you for 1 week.    Children and pregnant females should not visit or have close contact with you for 1 week.  Items that you touch are not radioactive.  Do not sleep in the same bed as your partner for 1 week, and a condom should be used for sexual activity during the first 24 hours.  Your blood may be radioactive and caution should be used if any bleeding occurs during the recovery period.  Body fluids may be radioactive for 24 hours.  Wash your hands after voiding.  Men should sit to urinate.  Dispose of any soiled  materials (flush down toilet or place in trash at home) during the first day.  Drink 6 to 8 glasses of fluids per day for 5 days to hydrate yourself.  If you need to see a doctor during the first week, you must let them know that you were treated with yttrium-90 microspheres, and will be slightly radioactive.  They can call Interventional Radiology [phone] with any questions. 

## 2022-12-08 ENCOUNTER — Other Ambulatory Visit (HOSPITAL_COMMUNITY): Payer: Self-pay | Admitting: Interventional Radiology

## 2022-12-08 ENCOUNTER — Other Ambulatory Visit: Payer: Self-pay | Admitting: Pulmonary Disease

## 2022-12-08 DIAGNOSIS — C7B8 Other secondary neuroendocrine tumors: Secondary | ICD-10-CM

## 2022-12-08 NOTE — Telephone Encounter (Signed)
Dr. Gonzalez, please advise if okay to refill. Thanks 

## 2022-12-08 NOTE — Progress Notes (Unsigned)
Established Patient Office Visit  Subjective:  Patient ID: Jason Malone, male    DOB: 1940-07-23  Age: 82 y.o. MRN: [id number]  CC:  No chief complaint on file.   HPI Anders W Detoro presents for follow up on chronic medical conditions.  Hypertension/A.Flutter: -Medications: Losartan 100 mg, now taking Amlodipine 5 mg ? -Patient is compliant with above medications and reports no side effects. -Checking BP at home (average): 120-130/80-90 last 3 days on current regimen  -Denies any SOB, CP, vision changes or symptoms of hypotension.  HLD: -Medications: Lipitor 40 mg -Patient is compliant with above medications and reports no side effects.  -Last lipid panel: Lipid Panel     Component Value Date/Time   CHOL 115 12/28/2021 1452   CHOL 133 02/14/2017 1046   TRIG 113 12/28/2021 1452   HDL 41 12/28/2021 1452   HDL 39 (L) 02/14/2017 1046   CHOLHDL 2.8 12/28/2021 1452   VLDL 9 06/15/2019 0114   LDLCALC 54 12/28/2021 1452   LABVLDL 24 02/14/2017 1046   Diabetes, Type 2: -Last A1c 7.0% 2/24 -Medications: Started on Metformin at LOV but could not tolerate, not on anything now -Fasting home BG: 140 or less  -Eye exam: UTD 3/24 -Foot exam: UTD 2/24 -Microalbumin: UTD 2/24 -Statin: yes -PNA vaccine: yes -Denies symptoms of hypoglycemia, polyuria, polydipsia, numbness extremities, foot ulcers/trauma.   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 Causing some nausea, fatigue. Last treatment was end of March, thinking this could be increasing BP.   Past Medical History:  Diagnosis Date   Allergic rhinitis    Aortic atherosclerosis (HCC)    Benign prostatic hypertrophy with lower urinary tract symptoms (LUTS)    Bradycardia    Chest pain    a. 05/2013 MV: EF 70%, no ischemia.   Chronic  respiratory failure with hypoxia (HCC)    COPD (chronic obstructive pulmonary disease) (HCC)    Coronary artery disease    COVID-19    Diabetes mellitus without complication (HCC)    last a1c 6.5 09-16-21 no meds   Diverticulitis    DIVERTICULOSIS   Dysrhythmia    Elevated LFTs    Enlarged prostate    Essential hypertension    CONTROLLED ON MEDS   Full dentures    H/O hypokalemia    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 08/28/2016   Hyperlipemia    Hypomagnesemia    IFG (impaired fasting glucose)    Liver cancer (HCC) 12/08/2016   Liver mass, left lobe 08/28/2016   Probably hemangioma; will get MRI of liver, refer to GI   Lung cancer (HCC)    a. carcinoid, left lung, Stage 1b (T2a, N0, cM0);  b. 05/2013 s/p VATS & LULobectomy.   Metabolic acidosis    Monocytosis    Osteopenia    Paroxysmal atrial flutter (HCC)    a. 03/2013->no recurrence;  b. CHA2DS2VASc = 2-->not currently on anticoagulation;  c. 05/2012 Echo: EF 55-60%, normal RV.   Pneumonia    Postinflammatory pulmonary fibrosis (HCC)    Rectal bleeding    Smoking history    Wears glasses     Past Surgical History:  Procedure Laterality Date   BRONCHOSCOPY     04/06/2013   CARDIOVASCULAR STRESS TEST  05/2013   a. No evidence of ischemia or infarct, EF 70%, no WMAs   CATARACT  EXTRACTION W/PHACO Left 07/14/2015   Procedure: CATARACT EXTRACTION PHACO AND INTRAOCULAR LENS PLACEMENT (IOC);  Surgeon: Lockie Mola, MD;  Location: [facility];  Service: Ophthalmology;  Laterality: Left;   CATARACT EXTRACTION W/PHACO Right 10/01/2015   Procedure: CATARACT EXTRACTION PHACO AND INTRAOCULAR LENS PLACEMENT (IOC);  Surgeon: Lockie Mola, MD;  Location: [facility];  Service: Ophthalmology;  Laterality: Right;   COLONOSCOPY W/ POLYPECTOMY     bleed after-had to go to surgery to stop bleeding via colonoscopy   COLONOSCOPY WITH PROPOFOL N/A 11/19/2015   Procedure: COLONOSCOPY WITH PROPOFOL;   Surgeon: [name], MD;  Location: [facility];  Service: Endoscopy;  Laterality: N/A;   DUPUYTREN CONTRACTURE RELEASE  12/15/2011   Procedure: DUPUYTREN CONTRACTURE RELEASE;  Surgeon: Nicki Reaper, MD;  Location: [facility];  Service: Orthopedics;  Laterality: Left;  Fasciotomy left ring finger dupuytrens   ELECTROMAGNETIC NAVIGATION BROCHOSCOPY Left 09/19/2019   Procedure: ELECTROMAGNETIC NAVIGATION BRONCHOSCOPY;  Surgeon: Salena Saner, MD;  Location: [facility];  Service: Cardiopulmonary;  Laterality: Left;   FASCIECTOMY Right 11/07/2018   Procedure: SEGMENTAL FASCIECTOMY RIGHT RING FINGER;  Surgeon: Cindee Salt, MD;  Location: [facility];  Service: Orthopedics;  Laterality: Right;  AXILLARY BLOCK   FLEXIBLE BRONCHOSCOPY Left 11/26/2019   Procedure: FLEXIBLE BRONCHOSCOPY WITH CRYO THERAPY;  Surgeon: Salena Saner, MD;  Location: [facility];  Service: Cardiopulmonary;  Laterality: Left;   FLEXIBLE BRONCHOSCOPY Left 10/14/2021   Procedure: FLEXIBLE BRONCHOSCOPY;  Surgeon: Salena Saner, MD;  Location: [facility];  Service: Pulmonary;  Laterality: Left;   IR ANGIOGRAM SELECTIVE EACH ADDITIONAL VESSEL  07/26/2017   IR ANGIOGRAM SELECTIVE EACH ADDITIONAL VESSEL  07/26/2017   IR ANGIOGRAM SELECTIVE EACH ADDITIONAL VESSEL  07/26/2017   IR ANGIOGRAM SELECTIVE EACH ADDITIONAL VESSEL  07/26/2017   IR ANGIOGRAM SELECTIVE EACH ADDITIONAL VESSEL  07/26/2017   IR ANGIOGRAM SELECTIVE EACH ADDITIONAL VESSEL  08/11/2017   IR ANGIOGRAM SELECTIVE EACH ADDITIONAL VESSEL  04/28/2018   IR ANGIOGRAM SELECTIVE EACH ADDITIONAL VESSEL  12/06/2022   IR ANGIOGRAM SELECTIVE EACH ADDITIONAL VESSEL  12/06/2022   IR ANGIOGRAM VISCERAL SELECTIVE  07/26/2017   IR ANGIOGRAM VISCERAL SELECTIVE  08/11/2017   IR ANGIOGRAM VISCERAL SELECTIVE  04/28/2018   IR ANGIOGRAM VISCERAL SELECTIVE  12/06/2022   IR EMBO ARTERIAL NOT HEMORR HEMANG INC GUIDE ROADMAPPING  07/26/2017   IR EMBO ARTERIAL NOT HEMORR  HEMANG INC GUIDE ROADMAPPING  12/06/2022   IR EMBO TUMOR ORGAN ISCHEMIA INFARCT INC GUIDE ROADMAPPING  08/11/2017   IR EMBO TUMOR ORGAN ISCHEMIA INFARCT INC GUIDE ROADMAPPING  04/28/2018   IR RADIOLOGIST EVAL & MGMT  07/07/2017   IR RADIOLOGIST EVAL & MGMT  09/07/2017   IR RADIOLOGIST EVAL & MGMT  12/21/2017   IR RADIOLOGIST EVAL & MGMT  03/22/2018   IR RADIOLOGIST EVAL & MGMT  06/08/2018   IR RADIOLOGIST EVAL & MGMT  11/01/2018   IR RADIOLOGIST EVAL & MGMT  03/21/2019   IR RADIOLOGIST EVAL & MGMT  11/08/2019   IR RADIOLOGIST EVAL & MGMT  07/01/2020   IR RADIOLOGIST EVAL & MGMT  12/18/2020   IR RADIOLOGIST EVAL & MGMT  06/18/2021   IR RADIOLOGIST EVAL & MGMT  12/14/2021   IR RADIOLOGIST EVAL & MGMT  12/31/2021   IR US GUIDE VASC ACCESS RIGHT  07/26/2017   IR US GUIDE VASC ACCESS RIGHT  08/11/2017   IR US GUIDE VASC ACCESS RIGHT  04/28/2018   IR US GUIDE VASC ACCESS  RIGHT  12/06/2022   LUNG LOBECTOMY  06/10/13   upper left lung   MULTIPLE TOOTH EXTRACTIONS     REMOVAL RETAINED LENS Right 11/17/2015   Procedure: REMOVAL RETAINED LENS FROAGMENTS RIGHT EYE;  Surgeon: Lockie Mola, MD;  Location: [facility];  Service: Ophthalmology;  Laterality: Right;   TONSILLECTOMY     UPPER GASTROINTESTINAL ENDOSCOPY  11-03-15   Dr Lemar Livings   VASECTOMY     VIDEO ASSISTED THORACOSCOPY (VATS)/WEDGE RESECTION Left 06/04/2013   Procedure: VIDEO ASSISTED THORACOSCOPY (VATS)/WEDGE RESECTION;  Surgeon: Delight Ovens, MD;  Location: [facility];  Service: Thoracic;  Laterality: Left;   VIDEO BRONCHOSCOPY N/A 06/04/2013   Procedure: VIDEO BRONCHOSCOPY;  Surgeon: Delight Ovens, MD;  Location: [facility] OR;  Service: Thoracic;  Laterality: N/A;   VIDEO BRONCHOSCOPY Left 08/12/2021   Procedure: VIDEO BRONCHOSCOPY WITHOUT FLUORO;  Surgeon: Salena Saner, MD;  Location: [facility];  Service: Cardiopulmonary;  Laterality: Left;   VIDEO BRONCHOSCOPY Left 09/09/2021   Procedure: VIDEO BRONCHOSCOPY WITHOUT FLUORO;  Surgeon: Salena Saner, MD;  Location: [facility];  Service: Cardiopulmonary;  Laterality: Left;    Family History  Problem Relation Age of Onset   Stroke Mother    Alzheimer's disease Mother    Hypertension Sister    Hyperlipidemia Sister    Hypertension Brother    Hyperlipidemia Brother    Diabetes Brother     Social History   Socioeconomic History   Marital status: Widowed    Spouse name: Talbert Forest   Number of children: 3   Years of education: Not on file   Highest education level: High school graduate  Occupational History   Not on file  Tobacco Use   Smoking status: Former    Current packs/day: 0.00    Average packs/day: 1 pack/day for 40.0 years (40.0 ttl pk-yrs)    Types: Cigarettes    Start date: 12/09/1958    Quit date: 12/09/1998    Years since quitting: 24.0   Smokeless tobacco: Never  Vaping Use   Vaping status: Never Used  Substance and Sexual Activity   Alcohol use: No   Drug use: No   Sexual activity: Yes    Partners: Female  Other Topics Concern   Not on file  Social History Narrative   Not on file   Social Determinants of Health   Financial Resource Strain: Low Risk  (06/12/2018)   Overall Financial Resource Strain (CARDIA)    Difficulty of Paying Living Expenses: Not hard at all  Food Insecurity: No Food Insecurity (06/12/2018)   Hunger Vital Sign    Worried About Running Out of Food in the Last Year: Never true    Ran Out of Food in the Last Year: Never true  Transportation Needs: No Transportation Needs (06/12/2018)   PRAPARE - Administrator, Civil Service (Medical): No    Lack of Transportation (Non-Medical): No  Physical Activity: Sufficiently Active (06/12/2018)   Exercise Vital Sign    Days of Exercise per Week: 7 days    Minutes of Exercise per Session: 60 min  Stress: No Stress Concern Present (06/12/2018)   [employer] of Occupational Health - Occupational Stress Questionnaire    Feeling of Stress : Not at all  Social Connections: Somewhat  Isolated (06/12/2018)   Social Connection and Isolation Panel [NHANES]    Frequency of Communication with Friends and Family: More than three times a week    Frequency of Social Gatherings with Friends and  Family: More than three times a week    Attends Religious Services: Never    Active Member of Clubs or Organizations: No    Attends Banker Meetings: Never    Marital Status: Married  Catering manager Violence: Not At Risk (06/12/2018)   Humiliation, Afraid, Rape, and Kick questionnaire    Fear of Current or Ex-Partner: No    Emotionally Abused: No    Physically Abused: No    Sexually Abused: No    Outpatient Medications Prior to Visit  Medication Sig Dispense Refill   amLODipine (NORVASC) 5 MG tablet Take 5 mg in the morning and take another 5 mg in the afternoon as needed if BP >150/100 90 tablet 1   aspirin EC 81 MG tablet Take 81 mg by mouth daily.     b complex vitamins capsule Take 1 capsule by mouth daily.     cholecalciferol (VITAMIN D3) 25 MCG (1000 UNIT) tablet Take 1,000 Units by mouth daily.     Cinnamon 500 MG capsule Take 500 mg by mouth at bedtime.     DM-APAP-CPM (CORICIDIN HBP PO) Take 2 tablets by mouth daily as needed (allergies/cold symptoms).     glucose blood (ONETOUCH VERIO) test strip USE 1 STRIP TO CHECK GLUCOSE ONCE DAILY 100 each 2   ipratropium (ATROVENT) 0.03 % nasal spray USE 2 SPRAY(S) IN EACH NOSTRIL THREE TIMES DAILY 30 mL 0   losartan (COZAAR) 100 MG tablet Take 1 tablet (100 mg total) by mouth every morning. 90 tablet 1   Multiple Vitamins-Minerals (MULTIVITAMIN WITH MINERALS) tablet Take 1 tablet by mouth daily.     ondansetron (ZOFRAN) 8 MG tablet Take 1 tablet (8 mg total) by mouth 2 (two) times daily as needed for nausea or vomiting. 20 tablet 0   polycarbophil (FIBERCON) 625 MG tablet Take 625 mg by mouth 2 (two) times daily.     polyvinyl alcohol (LIQUIFILM TEARS) 1.4 % ophthalmic solution Place 1 drop into both eyes as needed for dry  eyes.     rosuvastatin (CRESTOR) 20 MG tablet Take 1 tablet (20 mg total) by mouth daily. 90 tablet 1   vitamin C (ASCORBIC ACID) 500 MG tablet Take 500 mg by mouth daily.     zinc sulfate 220 (50 Zn) MG capsule Take 1 capsule (220 mg total) by mouth daily. 30 capsule 0   Facility-Administered Medications Prior to Visit  Medication Dose Route Frequency Provider Last Rate Last Admin   lanreotide acetate (SOMATULINE DEPOT) injection 120 mg  120 mg Subcutaneous Once Jeralyn Ruths, MD        Allergies  Allergen Reactions   Tape Itching    Surgical tapes     ROS Review of Systems  Constitutional:  Negative for chills and fever.  Eyes:  Negative for visual disturbance.  Respiratory:  Negative for cough, shortness of breath and wheezing.   Cardiovascular:  Negative for chest pain.  Neurological:  Negative for dizziness.      Objective:    Physical Exam Constitutional:      Appearance: Normal appearance.  HENT:     Head: Normocephalic and atraumatic.     Mouth/Throat:     Comments: PND present Eyes:     Conjunctiva/sclera: Conjunctivae normal.  Cardiovascular:     Rate and Rhythm: Normal rate and regular rhythm.  Pulmonary:     Effort: Pulmonary effort is normal.     Breath sounds: Normal breath sounds.  Skin:    General: Skin  is warm and dry.  Neurological:     General: No focal deficit present.     Mental Status: He is alert. Mental status is at baseline.  Psychiatric:        Mood and Affect: Mood normal.        Behavior: Behavior normal.     There were no vitals taken for this visit. Wt Readings from Last 3 Encounters:  12/06/22 154 lb (69.9 kg)  11/15/22 159 lb 6.4 oz (72.3 kg)  11/08/22 158 lb 6.4 oz (71.8 kg)     Health Maintenance Due  Topic Date Due   Medicare Annual Wellness (AWV)  Never done   Zoster Vaccines- Shingrix (1 of 2) Never done   COVID-19 Vaccine (4 - 2023-24 season) 01/08/2022    There are no preventive care reminders to display  for this patient.  Lab Results  Component Value Date   TSH 4.221 06/08/2013   Lab Results  Component Value Date   WBC 10.2 12/06/2022   HGB 14.7 12/06/2022   HCT 46.0 12/06/2022   MCV 94.7 12/06/2022   PLT 150 12/06/2022   Lab Results  Component Value Date   NA 139 12/06/2022   K 4.3 12/06/2022   CO2 25 12/06/2022   GLUCOSE 126 (H) 12/06/2022   BUN 18 12/06/2022   CREATININE 0.89 12/06/2022   BILITOT 0.9 12/06/2022   ALKPHOS 113 12/06/2022   AST 24 12/06/2022   ALT 22 12/06/2022   PROT 6.7 12/06/2022   ALBUMIN 3.9 12/06/2022   CALCIUM 9.3 12/06/2022   ANIONGAP 8 12/06/2022   Lab Results  Component Value Date   CHOL 115 12/28/2021   Lab Results  Component Value Date   HDL 41 12/28/2021   Lab Results  Component Value Date   LDLCALC 54 12/28/2021   Lab Results  Component Value Date   TRIG 113 12/28/2021   Lab Results  Component Value Date   CHOLHDL 2.8 12/28/2021   Lab Results  Component Value Date   HGBA1C 7.0 (A) 07/01/2022      Assessment & Plan:   1. Essential hypertension: Blood pressure well controlled here, continue Losartan 100 mg, add back Amlodipine 5 mg in the morning and 5 mg in the afternoon if BP >150/90. Follow up scheduled in August, will call if he needs anything.  - amLODipine (NORVASC) 5 MG tablet; Take 5 mg in the morning and take another 5 mg in the afternoon as needed if BP >150/100  Dispense: 90 tablet; Refill: 1  2. Grief: Still working through losing his wife Talbert Forest in December. Today is their 60th wedding anniversary. He is active and has good family support. Not interested in grief counseling or medication today, will continue to monitor.   3. Type 2 diabetes mellitus without complication, without long-term current use of insulin (HCC): Discontinued Metformin due to side effects, continue to monitor sugar and diet. Plan to recheck at follow up.    Follow-up: No follow-ups on file.    Margarita Mail, DO

## 2022-12-08 NOTE — Telephone Encounter (Signed)
OK to refill

## 2022-12-09 ENCOUNTER — Encounter: Payer: Self-pay | Admitting: Internal Medicine

## 2022-12-09 ENCOUNTER — Ambulatory Visit (INDEPENDENT_AMBULATORY_CARE_PROVIDER_SITE_OTHER): Payer: Medicare Other | Admitting: Internal Medicine

## 2022-12-09 VITALS — BP 122/70 | HR 60 | Temp 98.1°F | Resp 18 | Ht 64.0 in | Wt 156.9 lb

## 2022-12-09 DIAGNOSIS — E119 Type 2 diabetes mellitus without complications: Secondary | ICD-10-CM

## 2022-12-09 DIAGNOSIS — I10 Essential (primary) hypertension: Secondary | ICD-10-CM

## 2022-12-09 DIAGNOSIS — I2510 Atherosclerotic heart disease of native coronary artery without angina pectoris: Secondary | ICD-10-CM | POA: Diagnosis not present

## 2022-12-09 LAB — POCT GLYCOSYLATED HEMOGLOBIN (HGB A1C): Hemoglobin A1C: 6.7 % — AB (ref 4.0–5.6)

## 2022-12-09 MED ORDER — LOSARTAN POTASSIUM 100 MG PO TABS
100.0000 mg | ORAL_TABLET | ORAL | 1 refills | Status: DC
Start: 2022-12-09 — End: 2023-06-13

## 2022-12-13 ENCOUNTER — Inpatient Hospital Stay: Payer: Medicare Other | Attending: Oncology

## 2022-12-13 ENCOUNTER — Inpatient Hospital Stay: Payer: Medicare Other

## 2022-12-13 ENCOUNTER — Other Ambulatory Visit: Payer: Self-pay

## 2022-12-13 DIAGNOSIS — C7B8 Other secondary neuroendocrine tumors: Secondary | ICD-10-CM | POA: Insufficient documentation

## 2022-12-13 DIAGNOSIS — Z79899 Other long term (current) drug therapy: Secondary | ICD-10-CM | POA: Insufficient documentation

## 2022-12-13 DIAGNOSIS — C7A8 Other malignant neuroendocrine tumors: Secondary | ICD-10-CM | POA: Insufficient documentation

## 2022-12-13 LAB — COMPREHENSIVE METABOLIC PANEL
ALT: 16 U/L (ref 0–44)
AST: 19 U/L (ref 15–41)
Albumin: 3.9 g/dL (ref 3.5–5.0)
Alkaline Phosphatase: 108 U/L (ref 38–126)
Anion gap: 8 (ref 5–15)
BUN: 21 mg/dL (ref 8–23)
CO2: 25 mmol/L (ref 22–32)
Calcium: 9.4 mg/dL (ref 8.9–10.3)
Chloride: 106 mmol/L (ref 98–111)
Creatinine, Ser: 1.07 mg/dL (ref 0.61–1.24)
GFR, Estimated: >60 mL/min (ref >60)
Glucose, Bld: 113 mg/dL — ABNORMAL HIGH (ref 70–99)
Potassium: 4.4 mmol/L (ref 3.5–5.1)
Sodium: 139 mmol/L (ref 135–145)
Total Bilirubin: 0.6 mg/dL (ref 0.3–1.2)
Total Protein: 6.5 g/dL (ref 6.5–8.1)

## 2022-12-13 LAB — CBC WITH DIFFERENTIAL/PLATELET
Abs Immature Granulocytes: 0.02 10*3/uL (ref 0.00–0.07)
Basophils Absolute: 0.1 10*3/uL (ref 0.0–0.1)
Basophils Relative: 1 %
Eosinophils Absolute: 0.1 10*3/uL (ref 0.0–0.5)
Eosinophils Relative: 2 %
HCT: 44.5 % (ref 39.0–52.0)
Hemoglobin: 14.8 g/dL (ref 13.0–17.0)
Immature Granulocytes: 0 %
Lymphocytes Relative: 7 %
Lymphs Abs: 0.6 10*3/uL — ABNORMAL LOW (ref 0.7–4.0)
MCH: 30.6 pg (ref 26.0–34.0)
MCHC: 33.3 g/dL (ref 30.0–36.0)
MCV: 92.1 fL (ref 80.0–100.0)
Monocytes Absolute: 1.3 10*3/uL — ABNORMAL HIGH (ref 0.1–1.0)
Monocytes Relative: 17 %
Neutro Abs: 5.9 10*3/uL (ref 1.7–7.7)
Neutrophils Relative %: 73 %
Platelets: 165 10*3/uL (ref 150–400)
RBC: 4.83 MIL/uL (ref 4.22–5.81)
RDW: 13.8 % (ref 11.5–15.5)
WBC: 8.1 10*3/uL (ref 4.0–10.5)
nRBC: 0 % (ref 0.0–0.2)

## 2022-12-13 MED ORDER — LANREOTIDE ACETATE 120 MG/0.5ML ~~LOC~~ SOLN
120.0000 mg | Freq: Once | SUBCUTANEOUS | Status: AC
  Administered 2022-12-13: 120 mg via SUBCUTANEOUS
  Filled 2022-12-13: qty 120

## 2022-12-29 ENCOUNTER — Other Ambulatory Visit: Payer: Self-pay | Admitting: Radiology

## 2022-12-29 DIAGNOSIS — C7B8 Other secondary neuroendocrine tumors: Secondary | ICD-10-CM

## 2022-12-29 NOTE — H&P (Signed)
Referring Physician(s): Finnegan,T  Supervising Physician: Ruel Favors  Patient Status:  [facility]  Chief Complaint: Neuroendocrine tumor with metastasis to liver   Subjective: Patient is an 82 year old male known to IR team from left lung mass core biopsy in 2014, segment 3 hepatic lesion biopsy in 2018, left lobe hepatic Y90 radioembolization on 08/11/17, right hepatic Y90 radioembolization on 04/28/2018, and latest pre Y- 90 visceral/hepatic arteriogram roadmapping study on 12/06/2022 due to interval progression of disease predominantly to the right hepatic lobe, confirmed by MRI scan.  He presents today for repeat Y90 radioembolization to the right hepatic lobe.  Additional past medical history includes BPH, COPD, coronary artery disease, diabetes, diverticulosis, hypertension, hyperlipidemia, paroxysmal atrial flutter ,pulmonary fibrosis.   Past Medical History:  Diagnosis Date   Allergic rhinitis    Aortic atherosclerosis (HCC)    Benign prostatic hypertrophy with lower urinary tract symptoms (LUTS)    Bradycardia    Chest pain    a. 05/2013 MV: EF 70%, no ischemia.   Chronic respiratory failure with hypoxia (HCC)    COPD (chronic obstructive pulmonary disease) (HCC)    Coronary artery disease    COVID-19    Diabetes mellitus without complication (HCC)    last a1c 6.5 09-16-21 no meds   Diverticulitis    DIVERTICULOSIS   Dysrhythmia    Elevated LFTs    Enlarged prostate    Essential hypertension    CONTROLLED ON MEDS   Full dentures    H/O hypokalemia    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 08/28/2016   Hyperlipemia    Hypomagnesemia    IFG (impaired fasting glucose)    Liver cancer (HCC) 12/08/2016   Liver mass, left lobe 08/28/2016   Probably hemangioma; will get MRI of liver, refer to GI   Lung cancer (HCC)    a. carcinoid, left lung, Stage 1b (T2a, N0, cM0);  b. 05/2013 s/p VATS & LULobectomy.   Metabolic acidosis    Monocytosis    Osteopenia     Paroxysmal atrial flutter (HCC)    a. 03/2013->no recurrence;  b. CHA2DS2VASc = 2-->not currently on anticoagulation;  c. 05/2012 Echo: EF 55-60%, normal RV.   Pneumonia    Postinflammatory pulmonary fibrosis (HCC)    Rectal bleeding    Smoking history    Wears glasses    Past Surgical History:  Procedure Laterality Date   BRONCHOSCOPY     04/06/2013   CARDIOVASCULAR STRESS TEST  05/2013   a. No evidence of ischemia or infarct, EF 70%, no WMAs   CATARACT EXTRACTION W/PHACO Left 07/14/2015   Procedure: CATARACT EXTRACTION PHACO AND INTRAOCULAR LENS PLACEMENT (IOC);  Surgeon: Lockie Mola, MD;  Location: [facility];  Service: Ophthalmology;  Laterality: Left;   CATARACT EXTRACTION W/PHACO Right 10/01/2015   Procedure: CATARACT EXTRACTION PHACO AND INTRAOCULAR LENS PLACEMENT (IOC);  Surgeon: Lockie Mola, MD;  Location: [facility];  Service: Ophthalmology;  Laterality: Right;   COLONOSCOPY W/ POLYPECTOMY     bleed after-had to go to surgery to stop bleeding via colonoscopy   COLONOSCOPY WITH PROPOFOL N/A 11/19/2015   Procedure: COLONOSCOPY WITH PROPOFOL;  Surgeon: [name], MD;  Location: [facility];  Service: Endoscopy;  Laterality: N/A;   DUPUYTREN CONTRACTURE RELEASE  12/15/2011   Procedure: DUPUYTREN CONTRACTURE RELEASE;  Surgeon: Nicki Reaper, MD;  Location: [facility];  Service: Orthopedics;  Laterality: Left;  Fasciotomy left ring finger dupuytrens   ELECTROMAGNETIC NAVIGATION BROCHOSCOPY Left  09/19/2019   Procedure: ELECTROMAGNETIC NAVIGATION BRONCHOSCOPY;  Surgeon: Salena Saner, MD;  Location: [facility];  Service: Cardiopulmonary;  Laterality: Left;   FASCIECTOMY Right 11/07/2018   Procedure: SEGMENTAL FASCIECTOMY RIGHT RING FINGER;  Surgeon: Cindee Salt, MD;  Location: [facility];  Service: Orthopedics;  Laterality: Right;  AXILLARY BLOCK   FLEXIBLE BRONCHOSCOPY Left 11/26/2019   Procedure: FLEXIBLE  BRONCHOSCOPY WITH CRYO THERAPY;  Surgeon: Salena Saner, MD;  Location: [facility];  Service: Cardiopulmonary;  Laterality: Left;   FLEXIBLE BRONCHOSCOPY Left 10/14/2021   Procedure: FLEXIBLE BRONCHOSCOPY;  Surgeon: Salena Saner, MD;  Location: [facility];  Service: Pulmonary;  Laterality: Left;   IR ANGIOGRAM SELECTIVE EACH ADDITIONAL VESSEL  07/26/2017   IR ANGIOGRAM SELECTIVE EACH ADDITIONAL VESSEL  07/26/2017   IR ANGIOGRAM SELECTIVE EACH ADDITIONAL VESSEL  07/26/2017   IR ANGIOGRAM SELECTIVE EACH ADDITIONAL VESSEL  07/26/2017   IR ANGIOGRAM SELECTIVE EACH ADDITIONAL VESSEL  07/26/2017   IR ANGIOGRAM SELECTIVE EACH ADDITIONAL VESSEL  08/11/2017   IR ANGIOGRAM SELECTIVE EACH ADDITIONAL VESSEL  04/28/2018   IR ANGIOGRAM SELECTIVE EACH ADDITIONAL VESSEL  12/06/2022   IR ANGIOGRAM SELECTIVE EACH ADDITIONAL VESSEL  12/06/2022   IR ANGIOGRAM SELECTIVE EACH ADDITIONAL VESSEL  12/06/2022   IR ANGIOGRAM SELECTIVE EACH ADDITIONAL VESSEL  12/06/2022   IR ANGIOGRAM VISCERAL SELECTIVE  07/26/2017   IR ANGIOGRAM VISCERAL SELECTIVE  08/11/2017   IR ANGIOGRAM VISCERAL SELECTIVE  04/28/2018   IR ANGIOGRAM VISCERAL SELECTIVE  12/06/2022   IR EMBO ARTERIAL NOT HEMORR HEMANG INC GUIDE ROADMAPPING  07/26/2017   IR EMBO ARTERIAL NOT HEMORR HEMANG INC GUIDE ROADMAPPING  12/06/2022   IR EMBO TUMOR ORGAN ISCHEMIA INFARCT INC GUIDE ROADMAPPING  08/11/2017   IR EMBO TUMOR ORGAN ISCHEMIA INFARCT INC GUIDE ROADMAPPING  04/28/2018   IR RADIOLOGIST EVAL & MGMT  07/07/2017   IR RADIOLOGIST EVAL & MGMT  09/07/2017   IR RADIOLOGIST EVAL & MGMT  12/21/2017   IR RADIOLOGIST EVAL & MGMT  03/22/2018   IR RADIOLOGIST EVAL & MGMT  06/08/2018   IR RADIOLOGIST EVAL & MGMT  11/01/2018   IR RADIOLOGIST EVAL & MGMT  03/21/2019   IR RADIOLOGIST EVAL & MGMT  11/08/2019   IR RADIOLOGIST EVAL & MGMT  07/01/2020   IR RADIOLOGIST EVAL & MGMT  12/18/2020   IR RADIOLOGIST EVAL & MGMT  06/18/2021   IR RADIOLOGIST EVAL & MGMT  12/14/2021   IR RADIOLOGIST EVAL  & MGMT  12/31/2021   IR US GUIDE VASC ACCESS RIGHT  07/26/2017   IR US GUIDE VASC ACCESS RIGHT  08/11/2017   IR US GUIDE VASC ACCESS RIGHT  04/28/2018   IR US GUIDE VASC ACCESS RIGHT  12/06/2022   LUNG LOBECTOMY  06/10/13   upper left lung   MULTIPLE TOOTH EXTRACTIONS     REMOVAL RETAINED LENS Right 11/17/2015   Procedure: REMOVAL RETAINED LENS FROAGMENTS RIGHT EYE;  Surgeon: Lockie Mola, MD;  Location: [facility];  Service: Ophthalmology;  Laterality: Right;   TONSILLECTOMY     UPPER GASTROINTESTINAL ENDOSCOPY  11-03-15   Dr Lemar Livings   VASECTOMY     VIDEO ASSISTED THORACOSCOPY (VATS)/WEDGE RESECTION Left 06/04/2013   Procedure: VIDEO ASSISTED THORACOSCOPY (VATS)/WEDGE RESECTION;  Surgeon: Delight Ovens, MD;  Location: [facility] OR;  Service: Thoracic;  Laterality: Left;   VIDEO BRONCHOSCOPY N/A 06/04/2013   Procedure: VIDEO BRONCHOSCOPY;  Surgeon: Delight Ovens, MD;  Location: [facility];  Service: Thoracic;  Laterality: N/A;   VIDEO  BRONCHOSCOPY Left 08/12/2021   Procedure: VIDEO BRONCHOSCOPY WITHOUT FLUORO;  Surgeon: Salena Saner, MD;  Location: [facility];  Service: Cardiopulmonary;  Laterality: Left;   VIDEO BRONCHOSCOPY Left 09/09/2021   Procedure: VIDEO BRONCHOSCOPY WITHOUT FLUORO;  Surgeon: Salena Saner, MD;  Location: [facility];  Service: Cardiopulmonary;  Laterality: Left;      Allergies: Tape  Medications: Prior to Admission medications   Medication Sig Start Date End Date Taking? Authorizing Provider  amLODipine (NORVASC) 5 MG tablet Take 5 mg in the morning and take another 5 mg in the afternoon as needed if BP >150/100 09/03/22   Margarita Mail, DO  aspirin EC 81 MG tablet Take 81 mg by mouth daily.    [provider]  b complex vitamins capsule Take 1 capsule by mouth daily.    [provider]  cholecalciferol (VITAMIN D3) 25 MCG (1000 UNIT) tablet Take 1,000 Units by mouth daily.    [provider]  Cinnamon 500 MG capsule Take  500 mg by mouth at bedtime.    [provider]  DM-APAP-CPM (CORICIDIN HBP PO) Take 2 tablets by mouth daily as needed (allergies/cold symptoms).    [provider]  glucose blood (ONETOUCH VERIO) test strip USE 1 STRIP TO CHECK GLUCOSE ONCE DAILY 08/24/22   Margarita Mail, DO  ipratropium (ATROVENT) 0.03 % nasal spray USE 2 SPRAY(S) IN EACH NOSTRIL THREE TIMES DAILY 12/08/22   Salena Saner, MD  losartan (COZAAR) 100 MG tablet Take 1 tablet (100 mg total) by mouth every morning. 12/09/22   Margarita Mail, DO  Multiple Vitamins-Minerals (MULTIVITAMIN WITH MINERALS) tablet Take 1 tablet by mouth daily.    [provider]  ondansetron (ZOFRAN) 8 MG tablet Take 1 tablet (8 mg total) by mouth 2 (two) times daily as needed for nausea or vomiting. 06/09/22   Lorna Few, MD  polycarbophil (FIBERCON) 625 MG tablet Take 625 mg by mouth 2 (two) times daily.    [provider]  polyvinyl alcohol (LIQUIFILM TEARS) 1.4 % ophthalmic solution Place 1 drop into both eyes as needed for dry eyes.    [provider]  rosuvastatin (CRESTOR) 20 MG tablet Take 1 tablet (20 mg total) by mouth daily. 10/26/22   Margarita Mail, DO  vitamin C (ASCORBIC ACID) 500 MG tablet Take 500 mg by mouth daily.    [provider]  zinc sulfate 220 (50 Zn) MG capsule Take 1 capsule (220 mg total) by mouth daily. 06/18/19   Drema Dallas, MD     Vital Signs:   Code Status:   Physical Exam  Imaging: No results found.  Labs:  CBC: Recent Labs    10/15/22 1052 11/15/22 0950 12/06/22 0810 12/13/22 1016  WBC 6.8 8.2 10.2 8.1  HGB 15.0 14.8 14.7 14.8  HCT 45.4 44.9 46.0 44.5  PLT 177 162 150 165    COAGS: Recent Labs    12/06/22 0810  INR 1.0    BMP: Recent Labs    10/15/22 1052 11/15/22 0950 12/06/22 0810 12/13/22 1016  NA 142 139 139 139  K 4.2 4.4 4.3 4.4  CL 109 106 106 106  CO2 25 26 25 25   GLUCOSE 119* 142* 126* 113*  BUN 22 21 18 21    CALCIUM 9.5 9.5 9.3 9.4  CREATININE 0.85 0.99 0.89 1.07  GFRNONAA >60 >60 >60 >60    LIVER FUNCTION TESTS: Recent Labs    10/15/22 1052 11/15/22 0950 12/06/22 0810 12/13/22 1016  BILITOT  1.0 0.6 0.9 0.6  AST 25 19 24 19   ALT 20 17 22 16   ALKPHOS 139* 112 113 108  PROT 7.0 7.0 6.7 6.5  ALBUMIN 4.0 4.1 3.9 3.9    Assessment and Plan: 82 year old male known to IR team from left lung mass core biopsy in 2014, segment 3 hepatic lesion biopsy in 2018, left lobe hepatic Y90 radioembolization on 08/11/17, right hepatic Y90 radioembolization on 04/28/2018, and latest pre Y- 90 visceral/hepatic arteriogram roadmapping study on 12/06/2022 due to interval progression of disease predominantly to the right hepatic lobe, confirmed by MRI scan.  He presents today for repeat Y90 radioembolization to the right hepatic lobe.  Additional past medical history includes BPH, COPD, coronary artery disease, diabetes, diverticulosis, hypertension, hyperlipidemia, paroxysmal atrial flutter ,pulmonary fibrosis.Risks and benefits of procedure were discussed with the patient including, but not limited to bleeding, infection, vascular injury or contrast induced renal failure.  This interventional procedure involves the use of X-rays and because of the nature of the planned procedure, it is possible that we will have prolonged use of X-ray fluoroscopy.  Potential radiation risks to you include (but are not limited to) the following: - A slightly elevated risk for cancer  several years later in life. This risk is typically less than 0.5% percent. This risk is low in comparison to the normal incidence of human cancer, which is 33% for women and 50% for men according to the [employer]. - Radiation induced injury can include skin redness, resembling a rash, tissue breakdown / ulcers and hair loss (which can be temporary or permanent).   The likelihood of either of these occurring depends on the difficulty of  the procedure and whether you are sensitive to radiation due to previous procedures, disease, or genetic conditions.   IF your procedure requires a prolonged use of radiation, you will be notified and given written instructions for further action.  It is your responsibility to monitor the irradiated area for the 2 weeks following the procedure and to notify your physician if you are concerned that you have suffered a radiation induced injury.    All of the patient's questions were answered, patient is agreeable to proceed.  Consent signed and in chart.      Electronically Signed: D. Jeananne Rama, PA-C 12/29/2022, 4:56 PM   I spent a total of 20 minutes at the the patient's bedside AND on the patient's hospital floor or unit, greater than 50% of which was counseling/coordinating care for right hepatic Y90 radioembolization

## 2022-12-30 ENCOUNTER — Other Ambulatory Visit (HOSPITAL_COMMUNITY): Payer: Self-pay | Admitting: Interventional Radiology

## 2022-12-30 ENCOUNTER — Ambulatory Visit: Payer: Medicare Other | Admitting: Internal Medicine

## 2022-12-30 ENCOUNTER — Encounter (HOSPITAL_COMMUNITY)
Admission: RE | Admit: 2022-12-30 | Discharge: 2022-12-30 | Disposition: A | Discharge status: Home / Self Care | Payer: Medicare Other | Source: Ambulatory Visit | Attending: Interventional Radiology | Admitting: Interventional Radiology

## 2022-12-30 ENCOUNTER — Other Ambulatory Visit: Payer: Self-pay

## 2022-12-30 ENCOUNTER — Ambulatory Visit (HOSPITAL_COMMUNITY)
Admission: RE | Admit: 2022-12-30 | Discharge: 2022-12-30 | Disposition: A | Discharge status: Home / Self Care | Payer: Medicare Other | Source: Ambulatory Visit | Attending: Interventional Radiology | Admitting: Interventional Radiology

## 2022-12-30 ENCOUNTER — Encounter (HOSPITAL_COMMUNITY): Payer: Self-pay

## 2022-12-30 DIAGNOSIS — E785 Hyperlipidemia, unspecified: Secondary | ICD-10-CM | POA: Insufficient documentation

## 2022-12-30 DIAGNOSIS — C787 Secondary malignant neoplasm of liver and intrahepatic bile duct: Secondary | ICD-10-CM | POA: Insufficient documentation

## 2022-12-30 DIAGNOSIS — J449 Chronic obstructive pulmonary disease, unspecified: Secondary | ICD-10-CM | POA: Diagnosis not present

## 2022-12-30 DIAGNOSIS — C7B8 Other secondary neuroendocrine tumors: Secondary | ICD-10-CM

## 2022-12-30 DIAGNOSIS — J8410 Pulmonary fibrosis, unspecified: Secondary | ICD-10-CM | POA: Diagnosis not present

## 2022-12-30 DIAGNOSIS — C7A8 Other malignant neuroendocrine tumors: Secondary | ICD-10-CM | POA: Diagnosis not present

## 2022-12-30 DIAGNOSIS — I10 Essential (primary) hypertension: Secondary | ICD-10-CM | POA: Diagnosis not present

## 2022-12-30 DIAGNOSIS — N400 Enlarged prostate without lower urinary tract symptoms: Secondary | ICD-10-CM | POA: Insufficient documentation

## 2022-12-30 DIAGNOSIS — I2510 Atherosclerotic heart disease of native coronary artery without angina pectoris: Secondary | ICD-10-CM | POA: Diagnosis not present

## 2022-12-30 DIAGNOSIS — E119 Type 2 diabetes mellitus without complications: Secondary | ICD-10-CM | POA: Diagnosis not present

## 2022-12-30 HISTORY — PX: IR ANGIOGRAM VISCERAL SELECTIVE: IMG657

## 2022-12-30 HISTORY — PX: IR US GUIDE VASC ACCESS RIGHT: IMG2390

## 2022-12-30 HISTORY — PX: IR EMBO TUMOR ORGAN ISCHEMIA INFARCT INC GUIDE ROADMAPPING: IMG5449

## 2022-12-30 HISTORY — PX: IR ANGIOGRAM SELECTIVE EACH ADDITIONAL VESSEL: IMG667

## 2022-12-30 LAB — COMPREHENSIVE METABOLIC PANEL
ALT: 22 U/L (ref 0–44)
AST: 22 U/L (ref 15–41)
Albumin: 4 g/dL (ref 3.5–5.0)
Alkaline Phosphatase: 108 U/L (ref 38–126)
Anion gap: 7 (ref 5–15)
BUN: 21 mg/dL (ref 8–23)
CO2: 26 mmol/L (ref 22–32)
Calcium: 9.2 mg/dL (ref 8.9–10.3)
Chloride: 107 mmol/L (ref 98–111)
Creatinine, Ser: 0.93 mg/dL (ref 0.61–1.24)
GFR, Estimated: >60 mL/min (ref >60)
Glucose, Bld: 137 mg/dL — ABNORMAL HIGH (ref 70–99)
Potassium: 3.9 mmol/L (ref 3.5–5.1)
Sodium: 140 mmol/L (ref 135–145)
Total Bilirubin: 0.8 mg/dL (ref 0.3–1.2)
Total Protein: 6.7 g/dL (ref 6.5–8.1)

## 2022-12-30 LAB — PROTIME-INR
INR: 1 (ref 0.8–1.2)
Prothrombin Time: 13.1 seconds (ref 11.4–15.2)

## 2022-12-30 LAB — CBC WITH DIFFERENTIAL/PLATELET
Abs Immature Granulocytes: 0.03 10*3/uL (ref 0.00–0.07)
Basophils Absolute: 0.1 10*3/uL (ref 0.0–0.1)
Basophils Relative: 1 %
Eosinophils Absolute: 0.1 10*3/uL (ref 0.0–0.5)
Eosinophils Relative: 2 %
HCT: 46.1 % (ref 39.0–52.0)
Hemoglobin: 14.7 g/dL (ref 13.0–17.0)
Immature Granulocytes: 0 %
Lymphocytes Relative: 7 %
Lymphs Abs: 0.7 10*3/uL (ref 0.7–4.0)
MCH: 29.9 pg (ref 26.0–34.0)
MCHC: 31.9 g/dL (ref 30.0–36.0)
MCV: 93.9 fL (ref 80.0–100.0)
Monocytes Absolute: 1.3 10*3/uL — ABNORMAL HIGH (ref 0.1–1.0)
Monocytes Relative: 15 %
Neutro Abs: 6.6 10*3/uL (ref 1.7–7.7)
Neutrophils Relative %: 75 %
Platelets: 135 10*3/uL — ABNORMAL LOW (ref 150–400)
RBC: 4.91 MIL/uL (ref 4.22–5.81)
RDW: 14.1 % (ref 11.5–15.5)
WBC: 8.9 10*3/uL (ref 4.0–10.5)
nRBC: 0 % (ref 0.0–0.2)

## 2022-12-30 LAB — GLUCOSE, CAPILLARY: Glucose-Capillary: 143 mg/dL — ABNORMAL HIGH (ref 70–99)

## 2022-12-30 MED ORDER — LIDOCAINE HCL 1 % IJ SOLN
INTRAMUSCULAR | Status: AC
  Filled 2022-12-30: qty 20

## 2022-12-30 MED ORDER — SODIUM CHLORIDE 0.9 % IV SOLN: INTRAVENOUS | Status: DC

## 2022-12-30 MED ORDER — SODIUM CHLORIDE 0.9 % IV SOLN
8.0000 mg | Freq: Once | INTRAVENOUS | Status: DC
  Filled 2022-12-30: qty 4

## 2022-12-30 MED ORDER — PANTOPRAZOLE SODIUM 40 MG IV SOLR
40.0000 mg | Freq: Once | INTRAVENOUS | Status: AC
  Administered 2022-12-30: 40 mg via INTRAVENOUS
  Filled 2022-12-30: qty 10

## 2022-12-30 MED ORDER — FENTANYL CITRATE (PF) 100 MCG/2ML IJ SOLN
INTRAMUSCULAR | Status: AC | PRN
  Administered 2022-12-30: 25 ug via INTRAVENOUS

## 2022-12-30 MED ORDER — MIDAZOLAM HCL 2 MG/2ML IJ SOLN
INTRAMUSCULAR | Status: AC
  Filled 2022-12-30: qty 2

## 2022-12-30 MED ORDER — MIDAZOLAM HCL 2 MG/2ML IJ SOLN
INTRAMUSCULAR | Status: AC | PRN
  Administered 2022-12-30: .5 mg via INTRAVENOUS

## 2022-12-30 MED ORDER — ONDANSETRON 8 MG/NS 50 ML IVPB
8.0000 mg | Freq: Once | INTRAVENOUS | Status: AC
  Administered 2022-12-30: 8 mg via INTRAVENOUS
  Filled 2022-12-30: qty 8

## 2022-12-30 MED ORDER — IOHEXOL 300 MG/ML  SOLN
100.0000 mL | Freq: Once | INTRAMUSCULAR | Status: AC | PRN
  Administered 2022-12-30: 18 mL via INTRA_ARTERIAL

## 2022-12-30 MED ORDER — MIDAZOLAM HCL 2 MG/2ML IJ SOLN
INTRAMUSCULAR | Status: AC | PRN
  Administered 2022-12-30: 1 mg via INTRAVENOUS

## 2022-12-30 MED ORDER — IOHEXOL 300 MG/ML  SOLN
50.0000 mL | Freq: Once | INTRAMUSCULAR | Status: AC | PRN
  Administered 2022-12-30: 18 mL via INTRA_ARTERIAL

## 2022-12-30 MED ORDER — YTTRIUM 90 INJECTION: 28.4000 | INJECTION | Freq: Once | INTRAVENOUS | Status: DC | PRN

## 2022-12-30 MED ORDER — SODIUM CHLORIDE 0.9 % IV SOLN
INTRAVENOUS | Status: AC
  Filled 2022-12-30: qty 2

## 2022-12-30 MED ORDER — LIDOCAINE HCL 1 % IJ SOLN
20.0000 mL | Freq: Once | INTRAMUSCULAR | Status: AC
  Administered 2022-12-30: 5 mL via INTRADERMAL

## 2022-12-30 MED ORDER — SODIUM CHLORIDE 0.9 % IV SOLN
2.0000 g | Freq: Once | INTRAVENOUS | Status: AC
  Administered 2022-12-30: 2 g via INTRAVENOUS

## 2022-12-30 MED ORDER — FENTANYL CITRATE (PF) 100 MCG/2ML IJ SOLN
INTRAMUSCULAR | Status: AC
  Filled 2022-12-30: qty 2

## 2022-12-30 MED ORDER — DEXAMETHASONE SODIUM PHOSPHATE 10 MG/ML IJ SOLN
8.0000 mg | Freq: Once | INTRAMUSCULAR | Status: AC
  Administered 2022-12-30: 8 mg via INTRAVENOUS
  Filled 2022-12-30: qty 1

## 2022-12-30 NOTE — Addendum Note (Signed)
Encounter addended by: Assunta Found, RT on: 12/30/2022 8:06 AM  Actions taken: Imaging Exam ended

## 2022-12-30 NOTE — Discharge Instructions (Signed)
° °

## 2022-12-30 NOTE — Procedures (Signed)
Interventional Radiology Procedure Note  Procedure: rt hepatic Y90    Complications: None  Estimated Blood Loss:  MIN  Findings: FULL REPORT IN PACS     Sharen Counter, MD

## 2023-01-07 ENCOUNTER — Other Ambulatory Visit: Payer: Self-pay | Admitting: *Deleted

## 2023-01-07 ENCOUNTER — Telehealth: Payer: Self-pay | Admitting: Student

## 2023-01-07 ENCOUNTER — Other Ambulatory Visit: Payer: Self-pay | Admitting: Student

## 2023-01-07 ENCOUNTER — Telehealth (HOSPITAL_COMMUNITY): Payer: Self-pay | Admitting: Student

## 2023-01-07 DIAGNOSIS — C7A8 Other malignant neuroendocrine tumors: Secondary | ICD-10-CM

## 2023-01-07 MED ORDER — ONDANSETRON HCL 8 MG PO TABS: 8.0000 mg | ORAL_TABLET | Freq: Two times a day (BID) | ORAL | 1 refills | Status: AC | PRN

## 2023-01-07 MED ORDER — OXYCODONE HCL 5 MG PO CAPS: 5.0000 mg | ORAL_CAPSULE | Freq: Four times a day (QID) | ORAL | 0 refills | Status: AC | PRN

## 2023-01-07 NOTE — Telephone Encounter (Signed)
Patient called today regarding upset stomach and pain.  Patient is 82 yo male with stage IV metastatic neuroendocrine tumor with metastases to the bone and liver, s/p Y- 90 treatment by Dr. Miles Costain on 12/30/22.   Patient states that he started to have upset stomach and pain in right shoulder, elbow, and hip two days after the procedure.  He had Zofran at home so started to taking it which has been helping with the upset stomach.  He states that the pain in the right extremity is pretty bad, 7/10 pain scale, wakes him up at night. He took Tylenol but did not help much, took hydrocodone and he was able to sleep through the night.  Informed the patient that upset stomach sometimes happens after Y-90, but it is unusual that he has right sided joint pain, does not seem to be related to the procedure.  Patient states that it could be caused by him not able to be active lately due to upset stomach.   5 mg immediate release Oxycodone q6h PRN for three days sent to patient's pharmacy, he was encouraged to take the oxycodone when it is absolutely needed. He was also encouraged to be evaluated by his PCP if join pain continues.  He verbalized understanding, he has an appointment with his doctor on Tuesday.    Willette Brace PA-C 01/07/2023 3:56 PM

## 2023-01-07 NOTE — Telephone Encounter (Signed)
Zofran e-prescribed to the patient's pharmacy in [place] per request by Dr. Deanne Coffer.   Alwyn Ren, [place] [phone] 01/07/2023, 3:19 PM

## 2023-01-11 ENCOUNTER — Inpatient Hospital Stay: Payer: Medicare Other

## 2023-01-11 ENCOUNTER — Encounter: Payer: Self-pay | Admitting: Oncology

## 2023-01-11 ENCOUNTER — Inpatient Hospital Stay (HOSPITAL_BASED_OUTPATIENT_CLINIC_OR_DEPARTMENT_OTHER): Payer: Medicare Other | Admitting: Oncology

## 2023-01-11 ENCOUNTER — Inpatient Hospital Stay: Payer: Medicare Other | Attending: Oncology

## 2023-01-11 VITALS — BP 158/75 | HR 66 | Temp 97.9°F | Resp 16 | Ht 64.0 in | Wt 155.8 lb

## 2023-01-11 DIAGNOSIS — R110 Nausea: Secondary | ICD-10-CM | POA: Insufficient documentation

## 2023-01-11 DIAGNOSIS — Z79899 Other long term (current) drug therapy: Secondary | ICD-10-CM | POA: Insufficient documentation

## 2023-01-11 DIAGNOSIS — C7B02 Secondary carcinoid tumors of liver: Secondary | ICD-10-CM | POA: Insufficient documentation

## 2023-01-11 DIAGNOSIS — C7A090 Malignant carcinoid tumor of the bronchus and lung: Secondary | ICD-10-CM | POA: Diagnosis not present

## 2023-01-11 DIAGNOSIS — C7B8 Other secondary neuroendocrine tumors: Secondary | ICD-10-CM | POA: Diagnosis not present

## 2023-01-11 DIAGNOSIS — Z87891 Personal history of nicotine dependence: Secondary | ICD-10-CM | POA: Insufficient documentation

## 2023-01-11 DIAGNOSIS — E871 Hypo-osmolality and hyponatremia: Secondary | ICD-10-CM | POA: Insufficient documentation

## 2023-01-11 DIAGNOSIS — Z7982 Long term (current) use of aspirin: Secondary | ICD-10-CM | POA: Insufficient documentation

## 2023-01-11 DIAGNOSIS — C7A8 Other malignant neuroendocrine tumors: Secondary | ICD-10-CM | POA: Diagnosis not present

## 2023-01-11 LAB — CBC WITH DIFFERENTIAL/PLATELET
Abs Immature Granulocytes: 0.07 10*3/uL (ref 0.00–0.07)
Basophils Absolute: 0.1 10*3/uL (ref 0.0–0.1)
Basophils Relative: 1 %
Eosinophils Absolute: 0.1 10*3/uL (ref 0.0–0.5)
Eosinophils Relative: 1 %
HCT: 45.8 % (ref 39.0–52.0)
Hemoglobin: 15.1 g/dL (ref 13.0–17.0)
Immature Granulocytes: 1 %
Lymphocytes Relative: 4 %
Lymphs Abs: 0.4 10*3/uL — ABNORMAL LOW (ref 0.7–4.0)
MCH: 30.6 pg (ref 26.0–34.0)
MCHC: 33 g/dL (ref 30.0–36.0)
MCV: 92.9 fL (ref 80.0–100.0)
Monocytes Absolute: 1.2 10*3/uL — ABNORMAL HIGH (ref 0.1–1.0)
Monocytes Relative: 11 %
Neutro Abs: 8.9 10*3/uL — ABNORMAL HIGH (ref 1.7–7.7)
Neutrophils Relative %: 82 %
Platelets: 263 10*3/uL (ref 150–400)
RBC: 4.93 MIL/uL (ref 4.22–5.81)
RDW: 13.8 % (ref 11.5–15.5)
WBC: 10.7 10*3/uL — ABNORMAL HIGH (ref 4.0–10.5)
nRBC: 0 % (ref 0.0–0.2)

## 2023-01-11 LAB — CMP (CANCER CENTER ONLY)
ALT: 27 U/L (ref 0–44)
AST: 23 U/L (ref 15–41)
Albumin: 3.9 g/dL (ref 3.5–5.0)
Alkaline Phosphatase: 120 U/L (ref 38–126)
Anion gap: 10 (ref 5–15)
BUN: 17 mg/dL (ref 8–23)
CO2: 23 mmol/L (ref 22–32)
Calcium: 9.3 mg/dL (ref 8.9–10.3)
Chloride: 99 mmol/L (ref 98–111)
Creatinine: 0.95 mg/dL (ref 0.61–1.24)
GFR, Estimated: >60 mL/min (ref >60)
Glucose, Bld: 162 mg/dL — ABNORMAL HIGH (ref 70–99)
Potassium: 4.1 mmol/L (ref 3.5–5.1)
Sodium: 132 mmol/L — ABNORMAL LOW (ref 135–145)
Total Bilirubin: 0.4 mg/dL (ref 0.3–1.2)
Total Protein: 6.7 g/dL (ref 6.5–8.1)

## 2023-01-11 NOTE — Progress Notes (Signed)
[facility]  [phone] [phone]  ID: Jason Malone OB: 1940-09-10  MR#: [id number]  ACZ#:[id number]  Patient Care Team: Margarita Mail, DO as PCP - General (Internal Medicine) Antonieta Iba, MD as PCP - Cardiology (Cardiology) Byrnett, Merrily Pew, MD (General Surgery) Hulda Marin, MD (Inactive) as Referring Physician (Cardiothoracic Surgery) Antonieta Iba, MD as Consulting Physician (Cardiology) Delight Ovens, MD (Inactive) as Consulting Physician (Cardiothoracic Surgery) Jeralyn Ruths, MD as Consulting Physician (Oncology) Sherie Don Janit Bern, MD as Attending Physician (Family Medicine)   CHIEF COMPLAINT: 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  INTERVAL HISTORY: Patient returns to clinic today for further evaluation and continuation of lanreotide.  He recently underwent Y90 ablation approximately 2 weeks ago and since that time has had persistent nausea.  He otherwise feels well.  He denies any weakness or fatigue today.  He denies any pain. He has no neurologic complaints.  He denies any recent fevers or illnesses.  He has a good appetite and denies weight loss.  He denies any chest pain, shortness of breath, or hemoptysis.  He denies any abdominal pain.  He has no vomiting, constipation, or diarrhea.  He has no urinary complaints.  Patient offers no further specific complaints today.  REVIEW OF SYSTEMS:   Review of Systems  Constitutional: Negative.  Negative for fever, malaise/fatigue and weight loss.  Respiratory: Negative.  Negative for cough, hemoptysis and shortness of breath.   Cardiovascular: Negative.  Negative for chest pain and leg swelling.  Gastrointestinal: Negative.  Negative for abdominal pain, diarrhea, nausea and vomiting.  Genitourinary: Negative.  Negative for dysuria.  Musculoskeletal: Negative.  Negative for back pain.  Skin: Negative.  Negative for rash.  Neurological:  Negative.  Negative for dizziness, sensory change, focal weakness and weakness.  Psychiatric/Behavioral: Negative.  The patient is not nervous/anxious.     As per HPI. Otherwise, a complete review of systems is negative.  PAST MEDICAL HISTORY: Past Medical History:  Diagnosis Date   Allergic rhinitis    Aortic atherosclerosis (HCC)    Benign prostatic hypertrophy with lower urinary tract symptoms (LUTS)    Bradycardia    Chest pain    a. 05/2013 MV: EF 70%, no ischemia.   Chronic respiratory failure with hypoxia (HCC)    COPD (chronic obstructive pulmonary disease) (HCC)    Coronary artery disease    COVID-19    Diabetes mellitus without complication (HCC)    last a1c 6.5 09-16-21 no meds   Diverticulitis    DIVERTICULOSIS   Dysrhythmia    Elevated LFTs    Enlarged prostate    Essential hypertension    CONTROLLED ON MEDS   Full dentures    H/O hypokalemia    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 08/28/2016   Hyperlipemia    Hypomagnesemia    IFG (impaired fasting glucose)    Liver cancer (HCC) 12/08/2016   Liver mass, left lobe 08/28/2016   Probably hemangioma; will get MRI of liver, refer to GI   Lung cancer (HCC)    a. carcinoid, left lung, Stage 1b (T2a, N0, cM0);  b. 05/2013 s/p VATS & LULobectomy.   Metabolic acidosis    Monocytosis    Osteopenia    Paroxysmal atrial flutter (HCC)    a. 03/2013->no recurrence;  b. CHA2DS2VASc = 2-->not currently on anticoagulation;  c. 05/2012 Echo: EF 55-60%, normal RV.   Pneumonia    Postinflammatory pulmonary fibrosis (HCC)  Rectal bleeding    Smoking history    Wears glasses     PAST SURGICAL HISTORY: Past Surgical History:  Procedure Laterality Date   BRONCHOSCOPY     04/06/2013   CARDIOVASCULAR STRESS TEST  05/2013   a. No evidence of ischemia or infarct, EF 70%, no WMAs   CATARACT EXTRACTION W/PHACO Left 07/14/2015   Procedure: CATARACT EXTRACTION PHACO AND INTRAOCULAR LENS PLACEMENT (IOC);  Surgeon:  Lockie Mola, MD;  Location: [facility];  Service: Ophthalmology;  Laterality: Left;   CATARACT EXTRACTION W/PHACO Right 10/01/2015   Procedure: CATARACT EXTRACTION PHACO AND INTRAOCULAR LENS PLACEMENT (IOC);  Surgeon: Lockie Mola, MD;  Location: [facility];  Service: Ophthalmology;  Laterality: Right;   COLONOSCOPY W/ POLYPECTOMY     bleed after-had to go to surgery to stop bleeding via colonoscopy   COLONOSCOPY WITH PROPOFOL N/A 11/19/2015   Procedure: COLONOSCOPY WITH PROPOFOL;  Surgeon: [name], MD;  Location: [facility];  Service: Endoscopy;  Laterality: N/A;   DUPUYTREN CONTRACTURE RELEASE  12/15/2011   Procedure: DUPUYTREN CONTRACTURE RELEASE;  Surgeon: Nicki Reaper, MD;  Location: [facility];  Service: Orthopedics;  Laterality: Left;  Fasciotomy left ring finger dupuytrens   ELECTROMAGNETIC NAVIGATION BROCHOSCOPY Left 09/19/2019   Procedure: ELECTROMAGNETIC NAVIGATION BRONCHOSCOPY;  Surgeon: Salena Saner, MD;  Location: [facility];  Service: Cardiopulmonary;  Laterality: Left;   FASCIECTOMY Right 11/07/2018   Procedure: SEGMENTAL FASCIECTOMY RIGHT RING FINGER;  Surgeon: Cindee Salt, MD;  Location: [facility];  Service: Orthopedics;  Laterality: Right;  AXILLARY BLOCK   FLEXIBLE BRONCHOSCOPY Left 11/26/2019   Procedure: FLEXIBLE BRONCHOSCOPY WITH CRYO THERAPY;  Surgeon: Salena Saner, MD;  Location: [facility];  Service: Cardiopulmonary;  Laterality: Left;   FLEXIBLE BRONCHOSCOPY Left 10/14/2021   Procedure: FLEXIBLE BRONCHOSCOPY;  Surgeon: Salena Saner, MD;  Location: [facility];  Service: Pulmonary;  Laterality: Left;   IR ANGIOGRAM SELECTIVE EACH ADDITIONAL VESSEL  07/26/2017   IR ANGIOGRAM SELECTIVE EACH ADDITIONAL VESSEL  07/26/2017   IR ANGIOGRAM SELECTIVE EACH ADDITIONAL VESSEL  07/26/2017   IR ANGIOGRAM SELECTIVE EACH ADDITIONAL VESSEL  07/26/2017   IR ANGIOGRAM SELECTIVE EACH ADDITIONAL VESSEL  07/26/2017    IR ANGIOGRAM SELECTIVE EACH ADDITIONAL VESSEL  08/11/2017   IR ANGIOGRAM SELECTIVE EACH ADDITIONAL VESSEL  04/28/2018   IR ANGIOGRAM SELECTIVE EACH ADDITIONAL VESSEL  12/06/2022   IR ANGIOGRAM SELECTIVE EACH ADDITIONAL VESSEL  12/06/2022   IR ANGIOGRAM SELECTIVE EACH ADDITIONAL VESSEL  12/06/2022   IR ANGIOGRAM SELECTIVE EACH ADDITIONAL VESSEL  12/06/2022   IR ANGIOGRAM SELECTIVE EACH ADDITIONAL VESSEL  12/30/2022   IR ANGIOGRAM VISCERAL SELECTIVE  07/26/2017   IR ANGIOGRAM VISCERAL SELECTIVE  08/11/2017   IR ANGIOGRAM VISCERAL SELECTIVE  04/28/2018   IR ANGIOGRAM VISCERAL SELECTIVE  12/06/2022   IR ANGIOGRAM VISCERAL SELECTIVE  12/30/2022   IR EMBO ARTERIAL NOT HEMORR HEMANG INC GUIDE ROADMAPPING  07/26/2017   IR EMBO ARTERIAL NOT HEMORR HEMANG INC GUIDE ROADMAPPING  12/06/2022   IR EMBO TUMOR ORGAN ISCHEMIA INFARCT INC GUIDE ROADMAPPING  08/11/2017   IR EMBO TUMOR ORGAN ISCHEMIA INFARCT INC GUIDE ROADMAPPING  04/28/2018   IR EMBO TUMOR ORGAN ISCHEMIA INFARCT INC GUIDE ROADMAPPING  12/30/2022   IR RADIOLOGIST EVAL & MGMT  07/07/2017   IR RADIOLOGIST EVAL & MGMT  09/07/2017   IR RADIOLOGIST EVAL & MGMT  12/21/2017   IR RADIOLOGIST EVAL & MGMT  03/22/2018   IR RADIOLOGIST EVAL & MGMT  06/08/2018  IR RADIOLOGIST EVAL & MGMT  11/01/2018   IR RADIOLOGIST EVAL & MGMT  03/21/2019   IR RADIOLOGIST EVAL & MGMT  11/08/2019   IR RADIOLOGIST EVAL & MGMT  07/01/2020   IR RADIOLOGIST EVAL & MGMT  12/18/2020   IR RADIOLOGIST EVAL & MGMT  06/18/2021   IR RADIOLOGIST EVAL & MGMT  12/14/2021   IR RADIOLOGIST EVAL & MGMT  12/31/2021   IR US GUIDE VASC ACCESS RIGHT  07/26/2017   IR US GUIDE VASC ACCESS RIGHT  08/11/2017   IR US GUIDE VASC ACCESS RIGHT  04/28/2018   IR US GUIDE VASC ACCESS RIGHT  12/06/2022   IR US GUIDE VASC ACCESS RIGHT  12/30/2022   LUNG LOBECTOMY  06/10/13   upper left lung   MULTIPLE TOOTH EXTRACTIONS     REMOVAL RETAINED LENS Right 11/17/2015   Procedure: REMOVAL RETAINED LENS FROAGMENTS RIGHT EYE;  Surgeon:  Lockie Mola, MD;  Location: [facility];  Service: Ophthalmology;  Laterality: Right;   TONSILLECTOMY     UPPER GASTROINTESTINAL ENDOSCOPY  11-03-15   Dr Lemar Livings   VASECTOMY     VIDEO ASSISTED THORACOSCOPY (VATS)/WEDGE RESECTION Left 06/04/2013   Procedure: VIDEO ASSISTED THORACOSCOPY (VATS)/WEDGE RESECTION;  Surgeon: Delight Ovens, MD;  Location: [facility] OR;  Service: Thoracic;  Laterality: Left;   VIDEO BRONCHOSCOPY N/A 06/04/2013   Procedure: VIDEO BRONCHOSCOPY;  Surgeon: Delight Ovens, MD;  Location: [facility];  Service: Thoracic;  Laterality: N/A;   VIDEO BRONCHOSCOPY Left 08/12/2021   Procedure: VIDEO BRONCHOSCOPY WITHOUT FLUORO;  Surgeon: Salena Saner, MD;  Location: [facility];  Service: Cardiopulmonary;  Laterality: Left;   VIDEO BRONCHOSCOPY Left 09/09/2021   Procedure: VIDEO BRONCHOSCOPY WITHOUT FLUORO;  Surgeon: Salena Saner, MD;  Location: [facility];  Service: Cardiopulmonary;  Laterality: Left;    FAMILY HISTORY: Family History  Problem Relation Age of Onset   Stroke Mother    Alzheimer's disease Mother    Hypertension Sister    Hyperlipidemia Sister    Hypertension Brother    Hyperlipidemia Brother    Diabetes Brother     ADVANCED DIRECTIVES (Y/N):  N  HEALTH MAINTENANCE: Social History   Tobacco Use   Smoking status: Former    Current packs/day: 0.00    Average packs/day: 1 pack/day for 40.0 years (40.0 ttl pk-yrs)    Types: Cigarettes    Start date: 12/09/1958    Quit date: 12/09/1998    Years since quitting: 24.1    Passive exposure: Never   Smokeless tobacco: Never  Vaping Use   Vaping status: Never Used  Substance Use Topics   Alcohol use: No   Drug use: No     Colonoscopy:  PAP:  Bone density:  Lipid panel:  Allergies  Allergen Reactions   Tape Itching    Surgical tapes     Current Outpatient Medications  Medication Sig Dispense Refill   amLODipine (NORVASC) 5 MG tablet Take 5 mg in the morning and take another 5 mg in  the afternoon as needed if BP >150/100 90 tablet 1   aspirin EC 81 MG tablet Take 81 mg by mouth daily.     b complex vitamins capsule Take 1 capsule by mouth daily.     cholecalciferol (VITAMIN D3) 25 MCG (1000 UNIT) tablet Take 1,000 Units by mouth daily.     Cinnamon 500 MG capsule Take 500 mg by mouth at bedtime.     DM-APAP-CPM (CORICIDIN HBP PO) Take 2 tablets by mouth  daily as needed (allergies/cold symptoms).     glucose blood (ONETOUCH VERIO) test strip USE 1 STRIP TO CHECK GLUCOSE ONCE DAILY 100 each 2   ipratropium (ATROVENT) 0.03 % nasal spray USE 2 SPRAY(S) IN EACH NOSTRIL THREE TIMES DAILY 30 mL 0   losartan (COZAAR) 100 MG tablet Take 1 tablet (100 mg total) by mouth every morning. 90 tablet 1   Multiple Vitamins-Minerals (MULTIVITAMIN WITH MINERALS) tablet Take 1 tablet by mouth daily.     ondansetron (ZOFRAN) 8 MG tablet Take 1 tablet (8 mg total) by mouth 2 (two) times daily as needed for nausea or vomiting. 20 tablet 1   polycarbophil (FIBERCON) 625 MG tablet Take 625 mg by mouth 2 (two) times daily.     polyvinyl alcohol (LIQUIFILM TEARS) 1.4 % ophthalmic solution Place 1 drop into both eyes as needed for dry eyes.     rosuvastatin (CRESTOR) 20 MG tablet Take 1 tablet (20 mg total) by mouth daily. 90 tablet 1   vitamin C (ASCORBIC ACID) 500 MG tablet Take 500 mg by mouth daily.     zinc sulfate 220 (50 Zn) MG capsule Take 1 capsule (220 mg total) by mouth daily. 30 capsule 0   No current facility-administered medications for this visit.   Facility-Administered Medications Ordered in Other Visits  Medication Dose Route Frequency Provider Last Rate Last Admin   lanreotide acetate (SOMATULINE DEPOT) injection 120 mg  120 mg Subcutaneous Once Jeralyn Ruths, MD        OBJECTIVE: Vitals:   01/11/23 1027  BP: (!) 158/75  Pulse: 66  Resp: 16  Temp: 97.9 F (36.6 C)  SpO2: 98%     Body mass index is 26.74 kg/m.    ECOG FS:0 - Asymptomatic  General: Well-developed,  well-nourished, no acute distress. Eyes: Pink conjunctiva, anicteric sclera. HEENT: Normocephalic, moist mucous membranes. Lungs: No audible wheezing or coughing. Heart: Regular rate and rhythm. Abdomen: Soft, nontender, no obvious distention. Musculoskeletal: No edema, cyanosis, or clubbing. Neuro: Alert, answering all questions appropriately. Cranial nerves grossly intact. Skin: No rashes or petechiae noted. Psych: Normal affect.  LAB RESULTS:  Lab Results  Component Value Date   NA 132 (L) 01/11/2023   K 4.1 01/11/2023   CL 99 01/11/2023   CO2 23 01/11/2023   GLUCOSE 162 (H) 01/11/2023   BUN 17 01/11/2023   CREATININE 0.95 01/11/2023   CALCIUM 9.3 01/11/2023   PROT 6.7 01/11/2023   ALBUMIN 3.9 01/11/2023   AST 23 01/11/2023   ALT 27 01/11/2023   ALKPHOS 120 01/11/2023   BILITOT 0.4 01/11/2023   GFRNONAA >60 01/11/2023   GFRAA >60 01/29/2020    Lab Results  Component Value Date   WBC 10.7 (H) 01/11/2023   NEUTROABS 8.9 (H) 01/11/2023   HGB 15.1 01/11/2023   HCT 45.8 01/11/2023   MCV 92.9 01/11/2023   PLT 263 01/11/2023     STUDIES: NM Y90 LIVER SPECT THERAPY  Result Date: 12/30/2022 CLINICAL DATA:  Metastatic neuroendocrine tumor. Prior yttrium 90 radio embolization to the RIGHT hepatic lobe with 32 millicuries 2019. Subsequently patient underwent peptide receptor radiotherapy with 4 treatments 200 millicuries Lu177 DOTATATE finishing in March 2024. Recent MRI demonstrates enlarging lesions which are not DOTATATE avid. EXAM: NUCLEAR MEDICINE SPECIAL MED RAD PHYSICS CONS; NUCLEAR MEDICINE RADIO PHARM THERAPY INTRA ARTERIAL; NUCLEAR MEDICINE TREATMENT PROCEDURE; NUCLEAR MEDICINE LIVER SCAN TECHNIQUE: In conjunction with the interventional radiologist a Y- Microsphere dose was calculated utilizing body surface area formulation. Additional 3 dimensional  post processing volumetric calculations. Calculated dose equal 30.0 mCi. Pre therapy MAA liver SPECT scan and CTA were  evaluated. Utilizing a microcatheter system, the hepatic artery was selected and Y-90 microspheres were delivered in fractionated aliquots. Radiopharmaceutical was delivered by the interventional radiologist and nuclear radiologist. The patient tolerated procedure well. No adverse effects were noted. Bremsstrahlung planar and SPECT imaging of the abdomen following intrahepatic arterial delivery of Y-90 microsphere was performed. RADIOPHARMACEUTICALS:  28.4 millicuries yttrium 90 microspheres COMPARISON:  None Available. FINDINGS: Y - 90 microspheres therapy as above. Second therapy the right hepatic lobe. Bremsstrahlung planar and SPECT imaging of the abdomen following intrahepatic arterial delivery of Y-50microsphere demonstrates radioactivity localized to the RIGHT hepatic lobe. No evidence of extrahepatic activity. IMPRESSION: Successful Y - 90 microsphere delivery for treatment of unresectable liver metastasis. Second therapy to the RIGHT lobe. Bremssstrahlung scan demonstrates activity localized to RIGHT hepatic lobe with no extrahepatic activity identified. Electronically Signed   By: Genevive Bi M.D.   On: 12/30/2022 13:56   IR EMBO TUMOR ORGAN ISCHEMIA INFARCT INC GUIDE ROADMAPPING  Result Date: 12/30/2022 INDICATION: Metastatic neuroendocrine tumor to the liver EXAM: ULTRASOUND GUIDANCE FOR VASCULAR ACCESS CELIAC AND RIGHT HEPATIC ARTERY CATHETERIZATIONS AND ANGIOGRAMS PERIPHERAL RIGHT HEPATIC ARTERY Y 90 RADIO EMBOLIZATION MEDICATIONS: 2 G CEFOXITIN. The antibiotic was administered within 1 hour of the procedure ANESTHESIA/SEDATION: Moderate (conscious) sedation was employed during this procedure. A total of Versed 2.0 mg and Fentanyl 100 mcg was administered intravenously by the radiology nurse. Total intra-service moderate Sedation Time: 52 minutes. The patient's level of consciousness and vital signs were monitored continuously by radiology nursing throughout the procedure under my direct  supervision. CONTRAST:  55 cc omni 300 FLUOROSCOPY: Radiation Exposure Index (as provided by the fluoroscopic device): 161 mGy Kerma COMPLICATIONS: None immediate. PROCEDURE: Informed consent was obtained from the patient following explanation of the procedure, risks, benefits and alternatives. The patient understands, agrees and consents for the procedure. All questions were addressed. A time out was performed prior to the initiation of the procedure. Maximal barrier sterile technique utilized including caps, mask, sterile gowns, sterile gloves, large sterile drape, hand hygiene, and Betadine prep. Under sterile conditions and local anesthesia, ultrasound micropuncture access performed of the patent right common femoral artery. Images obtained for documentation of the patent right common femoral artery. Five French sheath inserted over a Bentson guidewire. Chung 2.5 catheter reformed in the aorta and utilized to select the celiac origin. Initial celiac angiogram performed. Celiac: Celiac origin and its branches remain widely patent. The splenic, left gastric and common hepatic vasculature all patent. Previous coil embolization of the GDA again noted. Right and left hepatic arteries patent. Renegade high flow microcatheter advanced over a single angle GT Glidewire into the right hepatic artery. Right hepatic angiogram performed. Right hepatic: Right hepatic artery remains patent. Scattered areas of rounded tumor vascularity and blushing noted in the right hepatic lobe. This catheter position correlates with the pre Y 90 planning for the delivery position. Right hepatic Y 90 radio embolization: Under intermittent fluoroscopy, the entire dose of the Y 90 SIRspheres was successfully delivered slowly into the right hepatic lobe. Hemostasis was not obtained. Final angiogram confirms preserved flow of the right hepatic vasculature. Catheter and base catheter removed. Hemostasis obtained with the CELT device. No immediate  complication. Patient tolerated procedure well. IMPRESSION: Successful right hepatic Y 90 radio embolization as above. Electronically Signed   By: Judie Petit.  Shick M.D.   On: 12/30/2022 12:16   IR US Guide  Vasc Access Right  Result Date: 12/30/2022 INDICATION: Metastatic neuroendocrine tumor to the liver EXAM: ULTRASOUND GUIDANCE FOR VASCULAR ACCESS CELIAC AND RIGHT HEPATIC ARTERY CATHETERIZATIONS AND ANGIOGRAMS PERIPHERAL RIGHT HEPATIC ARTERY Y 90 RADIO EMBOLIZATION MEDICATIONS: 2 G CEFOXITIN. The antibiotic was administered within 1 hour of the procedure ANESTHESIA/SEDATION: Moderate (conscious) sedation was employed during this procedure. A total of Versed 2.0 mg and Fentanyl 100 mcg was administered intravenously by the radiology nurse. Total intra-service moderate Sedation Time: 52 minutes. The patient's level of consciousness and vital signs were monitored continuously by radiology nursing throughout the procedure under my direct supervision. CONTRAST:  55 cc omni 300 FLUOROSCOPY: Radiation Exposure Index (as provided by the fluoroscopic device): 161 mGy Kerma COMPLICATIONS: None immediate. PROCEDURE: Informed consent was obtained from the patient following explanation of the procedure, risks, benefits and alternatives. The patient understands, agrees and consents for the procedure. All questions were addressed. A time out was performed prior to the initiation of the procedure. Maximal barrier sterile technique utilized including caps, mask, sterile gowns, sterile gloves, large sterile drape, hand hygiene, and Betadine prep. Under sterile conditions and local anesthesia, ultrasound micropuncture access performed of the patent right common femoral artery. Images obtained for documentation of the patent right common femoral artery. Five French sheath inserted over a Bentson guidewire. Chung 2.5 catheter reformed in the aorta and utilized to select the celiac origin. Initial celiac angiogram performed. Celiac:  Celiac origin and its branches remain widely patent. The splenic, left gastric and common hepatic vasculature all patent. Previous coil embolization of the GDA again noted. Right and left hepatic arteries patent. Renegade high flow microcatheter advanced over a single angle GT Glidewire into the right hepatic artery. Right hepatic angiogram performed. Right hepatic: Right hepatic artery remains patent. Scattered areas of rounded tumor vascularity and blushing noted in the right hepatic lobe. This catheter position correlates with the pre Y 90 planning for the delivery position. Right hepatic Y 90 radio embolization: Under intermittent fluoroscopy, the entire dose of the Y 90 SIRspheres was successfully delivered slowly into the right hepatic lobe. Hemostasis was not obtained. Final angiogram confirms preserved flow of the right hepatic vasculature. Catheter and base catheter removed. Hemostasis obtained with the CELT device. No immediate complication. Patient tolerated procedure well. IMPRESSION: Successful right hepatic Y 90 radio embolization as above. Electronically Signed   By: Judie Petit.  Shick M.D.   On: 12/30/2022 12:16   IR Angiogram Visceral Selective  Result Date: 12/30/2022 INDICATION: Metastatic neuroendocrine tumor to the liver EXAM: ULTRASOUND GUIDANCE FOR VASCULAR ACCESS CELIAC AND RIGHT HEPATIC ARTERY CATHETERIZATIONS AND ANGIOGRAMS PERIPHERAL RIGHT HEPATIC ARTERY Y 90 RADIO EMBOLIZATION MEDICATIONS: 2 G CEFOXITIN. The antibiotic was administered within 1 hour of the procedure ANESTHESIA/SEDATION: Moderate (conscious) sedation was employed during this procedure. A total of Versed 2.0 mg and Fentanyl 100 mcg was administered intravenously by the radiology nurse. Total intra-service moderate Sedation Time: 52 minutes. The patient's level of consciousness and vital signs were monitored continuously by radiology nursing throughout the procedure under my direct supervision. CONTRAST:  55 cc omni 300  FLUOROSCOPY: Radiation Exposure Index (as provided by the fluoroscopic device): 161 mGy Kerma COMPLICATIONS: None immediate. PROCEDURE: Informed consent was obtained from the patient following explanation of the procedure, risks, benefits and alternatives. The patient understands, agrees and consents for the procedure. All questions were addressed. A time out was performed prior to the initiation of the procedure. Maximal barrier sterile technique utilized including caps, mask, sterile gowns, sterile gloves, large sterile drape,  hand hygiene, and Betadine prep. Under sterile conditions and local anesthesia, ultrasound micropuncture access performed of the patent right common femoral artery. Images obtained for documentation of the patent right common femoral artery. Five French sheath inserted over a Bentson guidewire. Chung 2.5 catheter reformed in the aorta and utilized to select the celiac origin. Initial celiac angiogram performed. Celiac: Celiac origin and its branches remain widely patent. The splenic, left gastric and common hepatic vasculature all patent. Previous coil embolization of the GDA again noted. Right and left hepatic arteries patent. Renegade high flow microcatheter advanced over a single angle GT Glidewire into the right hepatic artery. Right hepatic angiogram performed. Right hepatic: Right hepatic artery remains patent. Scattered areas of rounded tumor vascularity and blushing noted in the right hepatic lobe. This catheter position correlates with the pre Y 90 planning for the delivery position. Right hepatic Y 90 radio embolization: Under intermittent fluoroscopy, the entire dose of the Y 90 SIRspheres was successfully delivered slowly into the right hepatic lobe. Hemostasis was not obtained. Final angiogram confirms preserved flow of the right hepatic vasculature. Catheter and base catheter removed. Hemostasis obtained with the CELT device. No immediate complication. Patient tolerated  procedure well. IMPRESSION: Successful right hepatic Y 90 radio embolization as above. Electronically Signed   By: Judie Petit.  Shick M.D.   On: 12/30/2022 12:16   IR Angiogram Selective Each Additional Vessel  Result Date: 12/30/2022 INDICATION: Metastatic neuroendocrine tumor to the liver EXAM: ULTRASOUND GUIDANCE FOR VASCULAR ACCESS CELIAC AND RIGHT HEPATIC ARTERY CATHETERIZATIONS AND ANGIOGRAMS PERIPHERAL RIGHT HEPATIC ARTERY Y 90 RADIO EMBOLIZATION MEDICATIONS: 2 G CEFOXITIN. The antibiotic was administered within 1 hour of the procedure ANESTHESIA/SEDATION: Moderate (conscious) sedation was employed during this procedure. A total of Versed 2.0 mg and Fentanyl 100 mcg was administered intravenously by the radiology nurse. Total intra-service moderate Sedation Time: 52 minutes. The patient's level of consciousness and vital signs were monitored continuously by radiology nursing throughout the procedure under my direct supervision. CONTRAST:  55 cc omni 300 FLUOROSCOPY: Radiation Exposure Index (as provided by the fluoroscopic device): 161 mGy Kerma COMPLICATIONS: None immediate. PROCEDURE: Informed consent was obtained from the patient following explanation of the procedure, risks, benefits and alternatives. The patient understands, agrees and consents for the procedure. All questions were addressed. A time out was performed prior to the initiation of the procedure. Maximal barrier sterile technique utilized including caps, mask, sterile gowns, sterile gloves, large sterile drape, hand hygiene, and Betadine prep. Under sterile conditions and local anesthesia, ultrasound micropuncture access performed of the patent right common femoral artery. Images obtained for documentation of the patent right common femoral artery. Five French sheath inserted over a Bentson guidewire. Chung 2.5 catheter reformed in the aorta and utilized to select the celiac origin. Initial celiac angiogram performed. Celiac: Celiac origin and its  branches remain widely patent. The splenic, left gastric and common hepatic vasculature all patent. Previous coil embolization of the GDA again noted. Right and left hepatic arteries patent. Renegade high flow microcatheter advanced over a single angle GT Glidewire into the right hepatic artery. Right hepatic angiogram performed. Right hepatic: Right hepatic artery remains patent. Scattered areas of rounded tumor vascularity and blushing noted in the right hepatic lobe. This catheter position correlates with the pre Y 90 planning for the delivery position. Right hepatic Y 90 radio embolization: Under intermittent fluoroscopy, the entire dose of the Y 90 SIRspheres was successfully delivered slowly into the right hepatic lobe. Hemostasis was not obtained. Final angiogram confirms preserved  flow of the right hepatic vasculature. Catheter and base catheter removed. Hemostasis obtained with the CELT device. No immediate complication. Patient tolerated procedure well. IMPRESSION: Successful right hepatic Y 90 radio embolization as above. Electronically Signed   By: Judie Petit.  Shick M.D.   On: 12/30/2022 12:16    ONCOLOGY HISTORY: Patient's pathology, imaging, and outside facility notes reviewed extensively. Patient underwent left upper lobe lobectomy in [place] on June 04, 2013. Final pathology results revealed a well differentiated neuroendocrine tumor with positive bronchial margins. Patient did not receive adjuvant XRT or chemotherapy at that time.  His most recent Y 90 ablation to his liver occurred on April 28, 2018.   CT scan results from December 28, 2021  with 2 new foci noted in his liver likely consistent with progressive disease.  Patient subsequently underwent 4 treatments with Lutathera completing on August 04, 2022.  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  Despite treatment with Lutathera, patient had no appreciable change in his disease burden and potentially a new focus in his liver.  Patient underwent Y90 embolization on December 30, 2022.  He has declined lanreotide today.  Return to clinic in 4 weeks for further evaluation and reconsideration of treatment.  Left mainstem endobronchial mass: CT scan results noted.  Consistent with neuroendocrine tumor.  No plans for pulmonary intervention at this time.  Continue follow-up with them as scheduled. Nausea: Continue ondansetron as scheduled. Hyponatremia: Mild, monitor.  Patient sodium level is 132. Leukocytosis: Likely reactive, monitor.   Patient expressed understanding and was in agreement with this plan. He also understands that He can call clinic at any time with any questions, concerns, or complaints.    Cancer Staging  Neuroendocrine carcinoma of lung [facility]) Staging form: Lung, AJCC 8th Edition - Clinical stage from 12/28/2016: Stage IVA (cT2a, cN0, cM1b) - Signed by Jeralyn Ruths, MD on 12/28/2016 Sites of metastasis: Liver   Jeralyn Ruths, MD   01/11/2023 2:02 PM

## 2023-01-11 NOTE — Progress Notes (Signed)
States that he does not want a shot today. He had a Y90 procedure on 8/22 and still feeling bad from that.

## 2023-01-12 LAB — CHROMOGRANIN A: Chromogranin A (ng/mL): 2114 ng/mL — ABNORMAL HIGH (ref 0.0–101.8)

## 2023-02-07 ENCOUNTER — Ambulatory Visit (INDEPENDENT_AMBULATORY_CARE_PROVIDER_SITE_OTHER): Payer: Medicare Other

## 2023-02-07 ENCOUNTER — Other Ambulatory Visit: Payer: Self-pay

## 2023-02-07 DIAGNOSIS — Z23 Encounter for immunization: Secondary | ICD-10-CM | POA: Diagnosis not present

## 2023-02-07 DIAGNOSIS — C7A8 Other malignant neuroendocrine tumors: Secondary | ICD-10-CM

## 2023-02-08 ENCOUNTER — Inpatient Hospital Stay (HOSPITAL_BASED_OUTPATIENT_CLINIC_OR_DEPARTMENT_OTHER): Payer: Medicare Other | Admitting: Oncology

## 2023-02-08 ENCOUNTER — Inpatient Hospital Stay: Payer: Medicare Other

## 2023-02-08 ENCOUNTER — Inpatient Hospital Stay: Payer: Medicare Other | Attending: Oncology

## 2023-02-08 ENCOUNTER — Encounter: Payer: Self-pay | Admitting: Oncology

## 2023-02-08 VITALS — BP 155/74 | HR 59 | Temp 97.7°F | Resp 16 | Ht 64.0 in | Wt 156.8 lb

## 2023-02-08 DIAGNOSIS — C7A8 Other malignant neuroendocrine tumors: Secondary | ICD-10-CM

## 2023-02-08 DIAGNOSIS — C7B02 Secondary carcinoid tumors of liver: Secondary | ICD-10-CM | POA: Diagnosis not present

## 2023-02-08 DIAGNOSIS — C7B8 Other secondary neuroendocrine tumors: Secondary | ICD-10-CM | POA: Diagnosis not present

## 2023-02-08 DIAGNOSIS — C7A090 Malignant carcinoid tumor of the bronchus and lung: Secondary | ICD-10-CM | POA: Insufficient documentation

## 2023-02-08 LAB — CBC WITH DIFFERENTIAL (CANCER CENTER ONLY)
Abs Immature Granulocytes: 0.05 10*3/uL (ref 0.00–0.07)
Basophils Absolute: 0.1 10*3/uL (ref 0.0–0.1)
Basophils Relative: 1 %
Eosinophils Absolute: 0.1 10*3/uL (ref 0.0–0.5)
Eosinophils Relative: 1 %
HCT: 46.5 % (ref 39.0–52.0)
Hemoglobin: 15.4 g/dL (ref 13.0–17.0)
Immature Granulocytes: 1 %
Lymphocytes Relative: 6 %
Lymphs Abs: 0.5 10*3/uL — ABNORMAL LOW (ref 0.7–4.0)
MCH: 30.5 pg (ref 26.0–34.0)
MCHC: 33.1 g/dL (ref 30.0–36.0)
MCV: 92.1 fL (ref 80.0–100.0)
Monocytes Absolute: 1.3 10*3/uL — ABNORMAL HIGH (ref 0.1–1.0)
Monocytes Relative: 16 %
Neutro Abs: 6.3 10*3/uL (ref 1.7–7.7)
Neutrophils Relative %: 75 %
Platelet Count: 145 10*3/uL — ABNORMAL LOW (ref 150–400)
RBC: 5.05 MIL/uL (ref 4.22–5.81)
RDW: 14.5 % (ref 11.5–15.5)
WBC Count: 8.3 10*3/uL (ref 4.0–10.5)
nRBC: 0 % (ref 0.0–0.2)

## 2023-02-08 LAB — CMP (CANCER CENTER ONLY)
ALT: 33 U/L (ref 0–44)
AST: 29 U/L (ref 15–41)
Albumin: 3.9 g/dL (ref 3.5–5.0)
Alkaline Phosphatase: 122 U/L (ref 38–126)
Anion gap: 7 (ref 5–15)
BUN: 17 mg/dL (ref 8–23)
CO2: 26 mmol/L (ref 22–32)
Calcium: 9.5 mg/dL (ref 8.9–10.3)
Chloride: 104 mmol/L (ref 98–111)
Creatinine: 0.93 mg/dL (ref 0.61–1.24)
GFR, Estimated: >60 mL/min (ref >60)
Glucose, Bld: 127 mg/dL — ABNORMAL HIGH (ref 70–99)
Potassium: 4.3 mmol/L (ref 3.5–5.1)
Sodium: 137 mmol/L (ref 135–145)
Total Bilirubin: 0.6 mg/dL (ref 0.3–1.2)
Total Protein: 7 g/dL (ref 6.5–8.1)

## 2023-02-08 MED ORDER — LANREOTIDE ACETATE 120 MG/0.5ML ~~LOC~~ SOLN
120.0000 mg | Freq: Once | SUBCUTANEOUS | Status: AC
  Administered 2023-02-08: 120 mg via SUBCUTANEOUS
  Filled 2023-02-08: qty 120

## 2023-02-08 NOTE — Progress Notes (Signed)
[facility]  [phone]) [phone] [phone]) [phone]  ID: Jason Malone OB: [birth year]  MR#: [id number]  XLK#:[id number]  Patient Care Team: Margarita Mail, DO as PCP - General (Internal Medicine) Antonieta Iba, MD as PCP - Cardiology (Cardiology) Byrnett, Merrily Pew, MD (General Surgery) Hulda Marin, MD (Inactive) as Referring Physician (Cardiothoracic Surgery) Antonieta Iba, MD as Consulting Physician (Cardiology) Delight Ovens, MD (Inactive) as Consulting Physician (Cardiothoracic Surgery) Jeralyn Ruths, MD as Consulting Physician (Oncology) Sherie Don Janit Bern, MD as Attending Physician (Family Medicine)   CHIEF COMPLAINT: 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  INTERVAL HISTORY: Patient returns to clinic today for further evaluation and continuation of lanreotide.  His nausea has essentially resolved and he feels back to his baseline.  He denies any weakness or fatigue today.  He denies any pain. He has no neurologic complaints.  He denies any recent fevers or illnesses.  He has a good appetite and denies weight loss.  He denies any chest pain, shortness of breath, or hemoptysis.  He denies any abdominal pain.  He has no nausea, vomiting, constipation, or diarrhea.  He has no urinary complaints.  Patient offers no further specific complaints today.  REVIEW OF SYSTEMS:   Review of Systems  Constitutional: Negative.  Negative for fever, malaise/fatigue and weight loss.  Respiratory: Negative.  Negative for cough, hemoptysis and shortness of breath.   Cardiovascular: Negative.  Negative for chest pain and leg swelling.  Gastrointestinal: Negative.  Negative for abdominal pain, diarrhea, nausea and vomiting.  Genitourinary: Negative.  Negative for dysuria.  Musculoskeletal: Negative.  Negative for back pain.  Skin: Negative.  Negative for rash.  Neurological: Negative.  Negative for dizziness, sensory change, focal  weakness and weakness.  Psychiatric/Behavioral: Negative.  The patient is not nervous/anxious.     As per HPI. Otherwise, a complete review of systems is negative.  PAST MEDICAL HISTORY: Past Medical History:  Diagnosis Date   Allergic rhinitis    Aortic atherosclerosis (HCC)    Benign prostatic hypertrophy with lower urinary tract symptoms (LUTS)    Bradycardia    Chest pain    a. 05/2013 MV: EF 70%, no ischemia.   Chronic respiratory failure with hypoxia (HCC)    COPD (chronic obstructive pulmonary disease) (HCC)    Coronary artery disease    COVID-19    Diabetes mellitus without complication (HCC)    last a1c 6.5 09-16-21 no meds   Diverticulitis    DIVERTICULOSIS   Dysrhythmia    Elevated LFTs    Enlarged prostate    Essential hypertension    CONTROLLED ON MEDS   Full dentures    H/O hypokalemia    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 08/28/2016   Hyperlipemia    Hypomagnesemia    IFG (impaired fasting glucose)    Liver cancer (HCC) 12/08/2016   Liver mass, left lobe 08/28/2016   Probably hemangioma; will get MRI of liver, refer to GI   Lung cancer (HCC)    a. carcinoid, left lung, Stage 1b (T2a, N0, cM0);  b. 05/2013 s/p VATS & LULobectomy.   Metabolic acidosis    Monocytosis    Osteopenia    Paroxysmal atrial flutter (HCC)    a. 03/2013->no recurrence;  b. CHA2DS2VASc = 2-->not currently on anticoagulation;  c. 05/2012 Echo: EF 55-60%, normal RV.   Pneumonia    Postinflammatory pulmonary fibrosis (HCC)    Rectal bleeding    Smoking  history    Wears glasses     PAST SURGICAL HISTORY: Past Surgical History:  Procedure Laterality Date   BRONCHOSCOPY     04/06/2013   CARDIOVASCULAR STRESS TEST  05/2013   a. No evidence of ischemia or infarct, EF 70%, no WMAs   CATARACT EXTRACTION W/PHACO Left 07/14/2015   Procedure: CATARACT EXTRACTION PHACO AND INTRAOCULAR LENS PLACEMENT (IOC);  Surgeon: Lockie Mola, MD;  Location[facility];   Service: Ophthalmology;  Laterality: Left;   CATARACT EXTRACTION W/PHACO Right 10/01/2015   Procedure: CATARACT EXTRACTION PHACO AND INTRAOCULAR LENS PLACEMENT (IOC);  Surgeon: Lockie Mola, MD;  Location: [facility];  Service: Ophthalmology;  Laterality: Right;   COLONOSCOPY W/ POLYPECTOMY     bleed after-had to go to surgery to stop bleeding via colonoscopy   COLONOSCOPY WITH PROPOFOL N/A 11/19/2015   Procedure: COLONOSCOPY WITH PROPOFOL;  Surgeon: [name], MD;  Location: [facility];  Service: Endoscopy;  Laterality: N/A;   DUPUYTREN CONTRACTURE RELEASE  12/15/2011   Procedure: DUPUYTREN CONTRACTURE RELEASE;  Surgeon: Nicki Reaper, MD;  Location: [facility];  Service: Orthopedics;  Laterality: Left;  Fasciotomy left ring finger dupuytrens   ELECTROMAGNETIC NAVIGATION BROCHOSCOPY Left 09/19/2019   Procedure: ELECTROMAGNETIC NAVIGATION BRONCHOSCOPY;  Surgeon: Salena Saner, MD;  Location: [facility] OR[facility];  Service: Cardiopulmonary;  Laterality: Left;   FASCIECTOMY Right 11/07/2018   Procedure: SEGMENTAL FASCIECTOMY RIGHT RING FINGER;  Surgeon: Cindee Salt, MD;  Location: [facility];  Service: Orthopedics;  Laterality: Right;  AXILLARY BLOCK   FLEXIBLE BRONCHOSCOPY Left 11/26/2019   Procedure: FLEXIBLE BRONCHOSCOPY WITH CRYO THERAPY;  Surgeon: Salena Saner, MD;  Location: [facility];  Service: Cardiopulmonary;  Laterality: Left;   FLEXIBLE BRONCHOSCOPY Left 10/14/2021   Procedure: FLEXIBLE BRONCHOSCOPY;  Surgeon: Salena Saner, MD;  Location: [facility];  Service: Pulmonary;  Laterality: Left;   IR ANGIOGRAM SELECTIVE EACH ADDITIONAL VESSEL  07/26/2017   IR ANGIOGRAM SELECTIVE EACH ADDITIONAL VESSEL  07/26/2017   IR ANGIOGRAM SELECTIVE EACH ADDITIONAL VESSEL  07/26/2017   IR ANGIOGRAM SELECTIVE EACH ADDITIONAL VESSEL  07/26/2017   IR ANGIOGRAM SELECTIVE EACH ADDITIONAL VESSEL  07/26/2017   IR ANGIOGRAM SELECTIVE EACH ADDITIONAL VESSEL  08/11/2017    IR ANGIOGRAM SELECTIVE EACH ADDITIONAL VESSEL  04/28/2018   IR ANGIOGRAM SELECTIVE EACH ADDITIONAL VESSEL  12/06/2022   IR ANGIOGRAM SELECTIVE EACH ADDITIONAL VESSEL  12/06/2022   IR ANGIOGRAM SELECTIVE EACH ADDITIONAL VESSEL  12/06/2022   IR ANGIOGRAM SELECTIVE EACH ADDITIONAL VESSEL  12/06/2022   IR ANGIOGRAM SELECTIVE EACH ADDITIONAL VESSEL  12/30/2022   IR ANGIOGRAM VISCERAL SELECTIVE  07/26/2017   IR ANGIOGRAM VISCERAL SELECTIVE  08/11/2017   IR ANGIOGRAM VISCERAL SELECTIVE  04/28/2018   IR ANGIOGRAM VISCERAL SELECTIVE  12/06/2022   IR ANGIOGRAM VISCERAL SELECTIVE  12/30/2022   IR EMBO ARTERIAL NOT HEMORR HEMANG INC GUIDE ROADMAPPING  07/26/2017   IR EMBO ARTERIAL NOT HEMORR HEMANG INC GUIDE ROADMAPPING  12/06/2022   IR EMBO TUMOR ORGAN ISCHEMIA INFARCT INC GUIDE ROADMAPPING  08/11/2017   IR EMBO TUMOR ORGAN ISCHEMIA INFARCT INC GUIDE ROADMAPPING  04/28/2018   IR EMBO TUMOR ORGAN ISCHEMIA INFARCT INC GUIDE ROADMAPPING  12/30/2022   IR RADIOLOGIST EVAL & MGMT  07/07/2017   IR RADIOLOGIST EVAL & MGMT  09/07/2017   IR RADIOLOGIST EVAL & MGMT  12/21/2017   IR RADIOLOGIST EVAL & MGMT  03/22/2018   IR RADIOLOGIST EVAL & MGMT  06/08/2018   IR RADIOLOGIST EVAL & MGMT  11/01/2018   IR RADIOLOGIST EVAL & MGMT  03/21/2019   IR RADIOLOGIST EVAL & MGMT  11/08/2019   IR RADIOLOGIST EVAL & MGMT  07/01/2020   IR RADIOLOGIST EVAL & MGMT  12/18/2020   IR RADIOLOGIST EVAL & MGMT  06/18/2021   IR RADIOLOGIST EVAL & MGMT  12/14/2021   IR RADIOLOGIST EVAL & MGMT  12/31/2021   IR US GUIDE VASC ACCESS RIGHT  07/26/2017   IR US GUIDE VASC ACCESS RIGHT  08/11/2017   IR US GUIDE VASC ACCESS RIGHT  04/28/2018   IR US GUIDE VASC ACCESS RIGHT  12/06/2022   IR US GUIDE VASC ACCESS RIGHT  12/30/2022   LUNG LOBECTOMY  06/10/13   upper left lung   MULTIPLE TOOTH EXTRACTIONS     REMOVAL RETAINED LENS Right 11/17/2015   Procedure: REMOVAL RETAINED LENS FROAGMENTS RIGHT EYE;  Surgeon: Lockie Mola, MD;  Location: [facility];   Service: Ophthalmology;  Laterality: Right;   TONSILLECTOMY     UPPER GASTROINTESTINAL ENDOSCOPY  11-03-15   Dr Lemar Livings   VASECTOMY     VIDEO ASSISTED THORACOSCOPY (VATS)/WEDGE RESECTION Left 06/04/2013   Procedure: VIDEO ASSISTED THORACOSCOPY (VATS)/WEDGE RESECTION;  Surgeon: Delight Ovens, MD;  Location: [facility] OR;  Service: Thoracic;  Laterality: Left;   VIDEO BRONCHOSCOPY N/A 06/04/2013   Procedure: VIDEO BRONCHOSCOPY;  Surgeon: Delight Ovens, MD;  Location: [facility] OR;  Service: Thoracic;  Laterality: N/A;   VIDEO BRONCHOSCOPY Left 08/12/2021   Procedure: VIDEO BRONCHOSCOPY WITHOUT FLUORO;  Surgeon: Salena Saner, MD;  Location: [facility];  Service: Cardiopulmonary;  Laterality: Left;   VIDEO BRONCHOSCOPY Left 09/09/2021   Procedure: VIDEO BRONCHOSCOPY WITHOUT FLUORO;  Surgeon: Salena Saner, MD;  Location: [facility];  Service: Cardiopulmonary;  Laterality: Left;    FAMILY HISTORY: Family History  Problem Relation Age of Onset   Stroke Mother    Alzheimer's disease Mother    Hypertension Sister    Hyperlipidemia Sister    Hypertension Brother    Hyperlipidemia Brother    Diabetes Brother     ADVANCED DIRECTIVES (Y/N):  N  HEALTH MAINTENANCE: Social History   Tobacco Use   Smoking status: Former    Current packs/day: 0.00    Average packs/day: 1 pack/day for 40.0 years (40.0 ttl pk-yrs)    Types: Cigarettes    Start date: 12/09/1958    Quit date: 12/09/1998    Years since quitting: 24.1    Passive exposure: Never   Smokeless tobacco: Never  Vaping Use   Vaping status: Never Used  Substance Use Topics   Alcohol use: No   Drug use: No     Colonoscopy:  PAP:  Bone density:  Lipid panel:  Allergies  Allergen Reactions   Tape Itching    Surgical tapes     Current Outpatient Medications  Medication Sig Dispense Refill   amLODipine (NORVASC) 5 MG tablet Take 5 mg in the morning and take another 5 mg in the afternoon as needed if BP >150/100 90 tablet 1    aspirin EC 81 MG tablet Take 81 mg by mouth daily.     b complex vitamins capsule Take 1 capsule by mouth daily.     cholecalciferol (VITAMIN D3) 25 MCG (1000 UNIT) tablet Take 1,000 Units by mouth daily.     Cinnamon 500 MG capsule Take 500 mg by mouth at bedtime.     DM-APAP-CPM (CORICIDIN HBP PO) Take 2 tablets by mouth daily as needed (allergies/cold symptoms).  glucose blood (ONETOUCH VERIO) test strip USE 1 STRIP TO CHECK GLUCOSE ONCE DAILY 100 each 2   ipratropium (ATROVENT) 0.03 % nasal spray USE 2 SPRAY(S) IN EACH NOSTRIL THREE TIMES DAILY 30 mL 0   losartan (COZAAR) 100 MG tablet Take 1 tablet (100 mg total) by mouth every morning. 90 tablet 1   Multiple Vitamins-Minerals (MULTIVITAMIN WITH MINERALS) tablet Take 1 tablet by mouth daily.     ondansetron (ZOFRAN) 8 MG tablet Take 1 tablet (8 mg total) by mouth 2 (two) times daily as needed for nausea or vomiting. 20 tablet 1   polycarbophil (FIBERCON) 625 MG tablet Take 625 mg by mouth 2 (two) times daily.     polyvinyl alcohol (LIQUIFILM TEARS) 1.4 % ophthalmic solution Place 1 drop into both eyes as needed for dry eyes.     rosuvastatin (CRESTOR) 20 MG tablet Take 1 tablet (20 mg total) by mouth daily. 90 tablet 1   vitamin C (ASCORBIC ACID) 500 MG tablet Take 500 mg by mouth daily.     zinc sulfate 220 (50 Zn) MG capsule Take 1 capsule (220 mg total) by mouth daily. 30 capsule 0   No current facility-administered medications for this visit.   Facility-Administered Medications Ordered in Other Visits  Medication Dose Route Frequency Provider Last Rate Last Admin   lanreotide acetate (SOMATULINE DEPOT) injection 120 mg  120 mg Subcutaneous Once Jeralyn Ruths, MD        OBJECTIVE: Vitals:   02/08/23 1007  BP: (!) 155/74  Pulse: (!) 59  Resp: 16  Temp: 97.7 F (36.5 C)  SpO2: 99%     Body mass index is 26.91 kg/m.    ECOG FS:0 - Asymptomatic  General: Well-developed, well-nourished, no acute distress. Eyes: Pink  conjunctiva, anicteric sclera. HEENT: Normocephalic, moist mucous membranes. Lungs: No audible wheezing or coughing. Heart: Regular rate and rhythm. Abdomen: Soft, nontender, no obvious distention. Musculoskeletal: No edema, cyanosis, or clubbing. Neuro: Alert, answering all questions appropriately. Cranial nerves grossly intact. Skin: No rashes or petechiae noted. Psych: Normal affect.  LAB RESULTS:  Lab Results  Component Value Date   NA 137 02/08/2023   K 4.3 02/08/2023   CL 104 02/08/2023   CO2 26 02/08/2023   GLUCOSE 127 (H) 02/08/2023   BUN 17 02/08/2023   CREATININE 0.93 02/08/2023   CALCIUM 9.5 02/08/2023   PROT 7.0 02/08/2023   ALBUMIN 3.9 02/08/2023   AST 29 02/08/2023   ALT 33 02/08/2023   ALKPHOS 122 02/08/2023   BILITOT 0.6 02/08/2023   GFRNONAA >60 02/08/2023   GFRAA >60 01/29/2020    Lab Results  Component Value Date   WBC 8.3 02/08/2023   NEUTROABS 6.3 02/08/2023   HGB 15.4 02/08/2023   HCT 46.5 02/08/2023   MCV 92.1 02/08/2023   PLT 145 (L) 02/08/2023     STUDIES: No results found.  ONCOLOGY HISTORY: Patient's pathology, imaging, and outside facility notes reviewed extensively. Patient underwent left upper lobe lobectomy in [place] on June 04, 2013. Final pathology results revealed a well differentiated neuroendocrine tumor with positive bronchial margins. Patient did not receive adjuvant XRT or chemotherapy at that time.  His most recent Y 90 ablation to his liver occurred on April 28, 2018.   CT scan results from December 28, 2021  with 2 new foci noted in his liver likely consistent with progressive disease.  Patient subsequently underwent 4 treatments with Lutathera completing on August 04, 2022.  ASSESSMENT: Stage IVA neuroendocrine tumor of the  lung metastatic to the liver.  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  Despite treatment with Lutathera, patient had no  appreciable change in his disease burden and potentially a new focus in his liver.  Patient underwent Y90 embolization on December 30, 2022.  Proceed with lanreotide today.  Return to clinic in 1 and 2 months for lanreotide only.  Patient then return to clinic in 3 months with repeat laboratory work, further evaluation, and continuation of treatment.  Left mainstem endobronchial mass: CT scan results noted.  Consistent with neuroendocrine tumor.  No plans for pulmonary intervention at this time.  Continue follow-up with them as scheduled. Nausea: Essentially resolved.  Continue ondansetron as needed. Hyponatremia: Resolved.   Leukocytosis: Resolved. Thrombocytopenia: Mild, monitor.  Patient expressed understanding and was in agreement with this plan. He also understands that He can call clinic at any time with any questions, concerns, or complaints.    Cancer Staging  Neuroendocrine carcinoma of lung [facility]) Staging form: Lung, AJCC 8th Edition - Clinical stage from 12/28/2016: Stage IVA (cT2a, cN0, cM1b) - Signed by Jeralyn Ruths, MD on 12/28/2016 Sites of metastasis: Liver   Jeralyn Ruths, MD   02/08/2023 1:04 PM

## 2023-02-10 LAB — CHROMOGRANIN A: Chromogranin A (ng/mL): 1858 ng/mL — ABNORMAL HIGH (ref 0.0–101.8)

## 2023-02-14 ENCOUNTER — Other Ambulatory Visit: Payer: Self-pay | Admitting: Pulmonary Disease

## 2023-02-15 ENCOUNTER — Encounter: Payer: Self-pay | Admitting: Pulmonary Disease

## 2023-03-11 ENCOUNTER — Inpatient Hospital Stay: Payer: Medicare Other | Attending: Oncology

## 2023-03-11 DIAGNOSIS — C7A090 Malignant carcinoid tumor of the bronchus and lung: Secondary | ICD-10-CM | POA: Insufficient documentation

## 2023-03-11 DIAGNOSIS — C7A8 Other malignant neuroendocrine tumors: Secondary | ICD-10-CM

## 2023-03-11 DIAGNOSIS — C7B02 Secondary carcinoid tumors of liver: Secondary | ICD-10-CM | POA: Diagnosis not present

## 2023-03-11 DIAGNOSIS — Z79899 Other long term (current) drug therapy: Secondary | ICD-10-CM | POA: Insufficient documentation

## 2023-03-11 MED ORDER — LANREOTIDE ACETATE 120 MG/0.5ML ~~LOC~~ SOLN
120.0000 mg | Freq: Once | SUBCUTANEOUS | Status: AC
  Administered 2023-03-11: 120 mg via SUBCUTANEOUS
  Filled 2023-03-11: qty 120

## 2023-03-31 ENCOUNTER — Other Ambulatory Visit: Payer: Self-pay | Admitting: Pulmonary Disease

## 2023-04-11 ENCOUNTER — Inpatient Hospital Stay: Payer: Medicare Other | Attending: Oncology

## 2023-04-11 DIAGNOSIS — C7B02 Secondary carcinoid tumors of liver: Secondary | ICD-10-CM | POA: Insufficient documentation

## 2023-04-11 DIAGNOSIS — C7A090 Malignant carcinoid tumor of the bronchus and lung: Secondary | ICD-10-CM | POA: Diagnosis not present

## 2023-04-11 DIAGNOSIS — C7A8 Other malignant neuroendocrine tumors: Secondary | ICD-10-CM

## 2023-04-11 DIAGNOSIS — Z79899 Other long term (current) drug therapy: Secondary | ICD-10-CM | POA: Diagnosis not present

## 2023-04-11 MED ORDER — LANREOTIDE ACETATE 120 MG/0.5ML ~~LOC~~ SOLN
120.0000 mg | Freq: Once | SUBCUTANEOUS | Status: AC
  Administered 2023-04-11: 120 mg via SUBCUTANEOUS
  Filled 2023-04-11: qty 120

## 2023-04-18 ENCOUNTER — Other Ambulatory Visit: Payer: Self-pay | Admitting: Oncology

## 2023-04-18 DIAGNOSIS — C7B8 Other secondary neuroendocrine tumors: Secondary | ICD-10-CM

## 2023-04-25 ENCOUNTER — Other Ambulatory Visit: Payer: Self-pay | Admitting: Internal Medicine

## 2023-04-25 DIAGNOSIS — E782 Mixed hyperlipidemia: Secondary | ICD-10-CM

## 2023-04-26 NOTE — Telephone Encounter (Signed)
Requested medications are due for refill today.  yes  Requested medications are on the active medications list.  yes  Last refill. 10/26/2022 #90 1 rf  Future visit scheduled.   yes  Notes to clinic.  Labs are expired.    Requested Prescriptions  Pending Prescriptions Disp Refills   rosuvastatin (CRESTOR) 20 MG tablet [Pharmacy Med Name: Rosuvastatin Calcium 20 MG Oral Tablet] 90 tablet 0    Sig: Take 1 tablet by mouth once daily     Cardiovascular:  Antilipid - Statins 2 Failed - 04/26/2023 12:59 PM      Failed - Lipid Panel in normal range within the last 12 months    Cholesterol, Total  Date Value Ref Range Status  02/14/2017 133 100 - 199 mg/dL Final   Cholesterol  Date Value Ref Range Status  12/28/2021 115 <200 mg/dL Final   LDL Cholesterol (Calc)  Date Value Ref Range Status  12/28/2021 54 mg/dL (calc) Final    Comment:    Reference range: <100 . Desirable range <100 mg/dL for primary prevention;   <70 mg/dL for patients with CHD or diabetic patients  with > or = 2 CHD risk factors. [name] LDL-C is now calculated using the Martin-Hopkins  calculation, which is a validated novel method providing  better accuracy than the Friedewald equation in the  estimation of LDL-C.  Horald Pollen et al. Lenox Ahr. 1610;960(45): 2061-2068  (http://education.[employer].com/faq/FAQ164)    HDL  Date Value Ref Range Status  12/28/2021 41 > OR = 40 mg/dL Final  40/98/1191 39 (L) >39 mg/dL Final    Comment:    **Effective February 28, 2017, HDL Cholesterol**   reference interval will be changing to:                                   Male        Male                               9 - [phone]    Triglycerides  Date Value Ref Range Status  12/28/2021 113 <150 mg/dL Final         Passed - Cr in normal range and within 360 days    Creatinine  Date Value Ref Range Status  02/08/2023 0.93 0.61 - 1.24 mg/dL Final   Creat  Date Value Ref Range Status  02/12/2016  0.88 0.70 - 1.18 mg/dL Final    Comment:      For patients > or = 80 years of age: The upper reference limit for Creatinine is approximately 13% higher for people identified as African-American.      Creatinine, Urine  Date Value Ref Range Status  07/01/2022 93 20 - 320 mg/dL Final         Passed - Patient is not pregnant      Passed - Valid encounter within last 12 months    Recent Outpatient Visits           4 months ago Type 2 diabetes mellitus without complication, without long-term current use of insulin [facility])   [place] [facility] Margarita Mail, DO   7 months ago Essential hypertension   [facility] [facility] Margarita Mail, DO   9 months ago Type 2 diabetes mellitus without complication, without long-term current use of insulin (HCC)  [facility] Margarita Mail, DO   1 year ago Essential hypertension   [place] [facility] Margarita Mail, DO   1 year ago Essential hypertension   [facility] Margarita Mail, DO       Future Appointments             In 1 month Margarita Mail, DO [facility]

## 2023-05-02 ENCOUNTER — Inpatient Hospital Stay
Admission: RE | Admit: 2023-05-02 | Discharge: 2023-05-02 | Disposition: A | Discharge status: Home / Self Care | Payer: Medicare Other | Source: Ambulatory Visit | Attending: Oncology | Admitting: Oncology

## 2023-05-02 DIAGNOSIS — C7A1 Malignant poorly differentiated neuroendocrine tumors: Secondary | ICD-10-CM | POA: Diagnosis not present

## 2023-05-02 DIAGNOSIS — C7B8 Other secondary neuroendocrine tumors: Secondary | ICD-10-CM

## 2023-05-02 MED ORDER — GADOPICLENOL 0.5 MMOL/ML IV SOLN
7.0000 mL | Freq: Once | INTRAVENOUS | Status: AC | PRN
  Administered 2023-05-02: 7 mL via INTRAVENOUS

## 2023-05-02 NOTE — Addendum Note (Signed)
Encounter addended by: Arby Barrette on: 05/02/2023 11:02 AM  Actions taken: Imaging Exam ended

## 2023-05-09 ENCOUNTER — Ambulatory Visit: Payer: Medicare Other | Admitting: Pulmonary Disease

## 2023-05-09 ENCOUNTER — Encounter: Payer: Self-pay | Admitting: Pulmonary Disease

## 2023-05-09 VITALS — BP 138/82 | HR 58 | Temp 97.3°F | Ht 64.0 in | Wt 159.8 lb

## 2023-05-09 DIAGNOSIS — C7A8 Other malignant neuroendocrine tumors: Secondary | ICD-10-CM

## 2023-05-09 DIAGNOSIS — R053 Chronic cough: Secondary | ICD-10-CM | POA: Diagnosis not present

## 2023-05-09 DIAGNOSIS — J8410 Pulmonary fibrosis, unspecified: Secondary | ICD-10-CM

## 2023-05-09 LAB — NITRIC OXIDE: Nitric Oxide: 5

## 2023-05-09 NOTE — Patient Instructions (Signed)
VISIT SUMMARY:  You came in today for a follow-up visit regarding your stage 4A neuroendocrine tumor of the lung, which has spread to your liver and bones. You mentioned having a persistent cough, especially in the mornings and when lying on your left side, and noted that your bone pain is manageable but occurs after physical activity.  YOUR PLAN:  -NEUROENDOCRINE CARCINOMA OF THE LUNG WITH METASTASIS TO LIVER AND BONE: This is an advanced stage of lung cancer that has spread to other parts of your body, including your liver and bones. You are currently managing this condition with monthly interventional radiology treatments, which you find painful but tolerable. We discussed the importance of continuing these treatments to manage the metastasis, despite the discomfort. Please continue with your monthly treatments and follow up in 4-6 months.  -CHRONIC COUGH: Your persistent cough is likely due to post-nasal drip, which is when mucus from your nose drips down the back of your throat. You are using Atrovent nasal spray twice daily to manage this. We will also check for airway inflammation to rule out other causes. Please continue using the Atrovent nasal spray as prescribed.  -GENERAL HEALTH MAINTENANCE: You are a former smoker, and no new health maintenance recommendations were discussed today.  INSTRUCTIONS:  Please continue with your monthly interventional radiology treatments and follow up in 4-6 months. Continue using Atrovent nasal spray twice daily for your cough. We will also check for airway inflammation to rule out other causes.

## 2023-05-10 ENCOUNTER — Other Ambulatory Visit: Payer: Self-pay

## 2023-05-10 ENCOUNTER — Encounter: Payer: Self-pay | Admitting: Pulmonary Disease

## 2023-05-10 DIAGNOSIS — C7A8 Other malignant neuroendocrine tumors: Secondary | ICD-10-CM

## 2023-05-12 ENCOUNTER — Inpatient Hospital Stay: Payer: Medicare Other | Admitting: Oncology

## 2023-05-12 ENCOUNTER — Inpatient Hospital Stay: Payer: Medicare Other

## 2023-05-12 ENCOUNTER — Inpatient Hospital Stay: Payer: Medicare Other | Attending: Oncology

## 2023-05-12 ENCOUNTER — Encounter: Payer: Self-pay | Admitting: Oncology

## 2023-05-12 VITALS — BP 139/76 | HR 59 | Temp 96.8°F | Resp 16 | Ht 64.0 in | Wt 154.0 lb

## 2023-05-12 DIAGNOSIS — C7B8 Other secondary neuroendocrine tumors: Secondary | ICD-10-CM

## 2023-05-12 DIAGNOSIS — C7B02 Secondary carcinoid tumors of liver: Secondary | ICD-10-CM | POA: Insufficient documentation

## 2023-05-12 DIAGNOSIS — C7A8 Other malignant neuroendocrine tumors: Secondary | ICD-10-CM | POA: Diagnosis not present

## 2023-05-12 DIAGNOSIS — C7A090 Malignant carcinoid tumor of the bronchus and lung: Secondary | ICD-10-CM | POA: Diagnosis not present

## 2023-05-12 LAB — CMP (CANCER CENTER ONLY)
ALT: 35 U/L (ref 0–44)
AST: 32 U/L (ref 15–41)
Albumin: 4.2 g/dL (ref 3.5–5.0)
Alkaline Phosphatase: 156 U/L — ABNORMAL HIGH (ref 38–126)
Anion gap: 9 (ref 5–15)
BUN: 24 mg/dL — ABNORMAL HIGH (ref 8–23)
CO2: 27 mmol/L (ref 22–32)
Calcium: 9.9 mg/dL (ref 8.9–10.3)
Chloride: 104 mmol/L (ref 98–111)
Creatinine: 1.01 mg/dL (ref 0.61–1.24)
GFR, Estimated: >60 mL/min (ref >60)
Glucose, Bld: 154 mg/dL — ABNORMAL HIGH (ref 70–99)
Potassium: 4.7 mmol/L (ref 3.5–5.1)
Sodium: 140 mmol/L (ref 135–145)
Total Bilirubin: 0.8 mg/dL (ref 0.0–1.2)
Total Protein: 7.4 g/dL (ref 6.5–8.1)

## 2023-05-12 LAB — CBC WITH DIFFERENTIAL (CANCER CENTER ONLY)
Abs Immature Granulocytes: 0.03 10*3/uL (ref 0.00–0.07)
Basophils Absolute: 0.1 10*3/uL (ref 0.0–0.1)
Basophils Relative: 1 %
Eosinophils Absolute: 0.1 10*3/uL (ref 0.0–0.5)
Eosinophils Relative: 1 %
HCT: 51.7 % (ref 39.0–52.0)
Hemoglobin: 16.9 g/dL (ref 13.0–17.0)
Immature Granulocytes: 0 %
Lymphocytes Relative: 7 %
Lymphs Abs: 0.6 10*3/uL — ABNORMAL LOW (ref 0.7–4.0)
MCH: 30.1 pg (ref 26.0–34.0)
MCHC: 32.7 g/dL (ref 30.0–36.0)
MCV: 92.2 fL (ref 80.0–100.0)
Monocytes Absolute: 1.2 10*3/uL — ABNORMAL HIGH (ref 0.1–1.0)
Monocytes Relative: 14 %
Neutro Abs: 6.2 10*3/uL (ref 1.7–7.7)
Neutrophils Relative %: 77 %
Platelet Count: 160 10*3/uL (ref 150–400)
RBC: 5.61 MIL/uL (ref 4.22–5.81)
RDW: 13.9 % (ref 11.5–15.5)
WBC Count: 8.2 10*3/uL (ref 4.0–10.5)
nRBC: 0 % (ref 0.0–0.2)

## 2023-05-12 MED ORDER — LANREOTIDE ACETATE 120 MG/0.5ML ~~LOC~~ SOLN
120.0000 mg | Freq: Once | SUBCUTANEOUS | Status: AC
  Administered 2023-05-12: 120 mg via SUBCUTANEOUS
  Filled 2023-05-12: qty 120

## 2023-05-12 NOTE — Progress Notes (Signed)
  [facility]  [phone]) [phone] Fax:(336) [phone]  ID: Jason Malone OB: July 01, 1940  MR#: [id number]  RDW#:[id number]  Patient Care Team: [name], DO as PCP - General (Internal Medicine) [name], MD as PCP - Cardiology (Cardiology) Byrnett, Reyes LELON, MD (General Surgery) Volney Evalene, MD (Inactive) as Referring Physician (Cardiothoracic Surgery) [name], MD as Consulting Physician (Cardiology) Army Dallas NOVAK, MD (Inactive) as Consulting Physician (Cardiothoracic Surgery) [name], MD as Consulting Physician (Oncology) Hinton Newell SQUIBB, MD as Attending Physician (Family Medicine)   CHIEF COMPLAINT: 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  INTERVAL HISTORY: Patient returns to clinic today for further evaluation, discussion of his MRI results, and continuation of lanreotide.  He currently feels well and is at his baseline. He denies any weakness or fatigue today.  He denies any pain. He has no neurologic complaints.  He denies any recent fevers or illnesses.  He has a good appetite and denies weight loss.  He denies any chest pain, shortness of breath, or hemoptysis.  He denies any abdominal pain.  He has no nausea, vomiting, constipation, or diarrhea.  He has no urinary complaints.  Patient offers no specific complaints today.  REVIEW OF SYSTEMS:   Review of Systems  Constitutional: Negative.  Negative for fever, malaise/fatigue and weight loss.  Respiratory: Negative.  Negative for cough, hemoptysis and shortness of breath.   Cardiovascular: Negative.  Negative for chest pain and leg swelling.  Gastrointestinal: Negative.  Negative for abdominal pain, diarrhea, nausea and vomiting.  Genitourinary: Negative.  Negative for dysuria.  Musculoskeletal: Negative.  Negative for back pain.  Skin: Negative.  Negative for rash.  Neurological: Negative.  Negative for dizziness, sensory change, focal  weakness and weakness.  Psychiatric/Behavioral: Negative.  The patient is not nervous/anxious.     As per HPI. Otherwise, a complete review of systems is negative.  PAST MEDICAL HISTORY: Past Medical History:  Diagnosis Date   Allergic rhinitis    Aortic atherosclerosis (HCC)    Benign prostatic hypertrophy with lower urinary tract symptoms (LUTS)    Bradycardia    Chest pain    a. 05/2013 MV: EF 70%, no ischemia.   Chronic respiratory failure with hypoxia (HCC)    COPD (chronic obstructive pulmonary disease) (HCC)    Coronary artery disease    COVID-19    Diabetes mellitus without complication (HCC)    last a1c 6.5 09-16-21 no meds   Diverticulitis    DIVERTICULOSIS   Dysrhythmia    Elevated LFTs    Enlarged prostate    Essential hypertension    CONTROLLED ON MEDS   Full dentures    H/O hypokalemia    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 08/28/2016   Hyperlipemia    Hypomagnesemia    IFG (impaired fasting glucose)    Liver cancer (HCC) 12/08/2016   Liver mass, left lobe 08/28/2016   Probably hemangioma; will get MRI of liver, refer to GI   Lung cancer (HCC)    a. carcinoid, left lung, Stage 1b (T2a, N0, cM0);  b. 05/2013 s/p VATS & LULobectomy.   Metabolic acidosis    Monocytosis    Osteopenia    Paroxysmal atrial flutter (HCC)    a. 03/2013->no recurrence;  b. CHA2DS2VASc = 2-->not currently on anticoagulation;  c. 05/2012 Echo: EF 55-60%, normal RV.   Pneumonia    Postinflammatory pulmonary fibrosis (HCC)    Rectal bleeding    Smoking  history    Wears glasses     PAST SURGICAL HISTORY: Past Surgical History:  Procedure Laterality Date   BRONCHOSCOPY     04/06/2013   CARDIOVASCULAR STRESS TEST  05/2013   a. No evidence of ischemia or infarct, EF 70%, no WMAs   CATARACT EXTRACTION W/PHACO Left 07/14/2015   Procedure: CATARACT EXTRACTION PHACO AND INTRAOCULAR LENS PLACEMENT (IOC);  Surgeon: Dene Etienne, MD;  Location: [facility];   Service: Ophthalmology;  Laterality: Left;   CATARACT EXTRACTION W/PHACO Right 10/01/2015   Procedure: CATARACT EXTRACTION PHACO AND INTRAOCULAR LENS PLACEMENT (IOC);  Surgeon: Dene Etienne, MD;  Location: [facility];  Service: Ophthalmology;  Laterality: Right;   COLONOSCOPY W/ POLYPECTOMY     bleed after-had to go to surgery to stop bleeding via colonoscopy   COLONOSCOPY WITH PROPOFOL  N/A 11/19/2015   Procedure: COLONOSCOPY WITH PROPOFOL ;  Surgeon: Reyes LELON Cota, MD;  Location: [facility];  Service: Endoscopy;  Laterality: N/A;   DUPUYTREN CONTRACTURE RELEASE  12/15/2011   Procedure: DUPUYTREN CONTRACTURE RELEASE;  Surgeon: Arley JONELLE Curia, MD;  Location: [facility];  Service: Orthopedics;  Laterality: Left;  Fasciotomy left ring finger dupuytrens   ELECTROMAGNETIC NAVIGATION BROCHOSCOPY Left 09/19/2019   Procedure: ELECTROMAGNETIC NAVIGATION BRONCHOSCOPY;  Surgeon: Tamea Dedra CROME, MD;  Location: [facility];  Service: Cardiopulmonary;  Laterality: Left;   FASCIECTOMY Right 11/07/2018   Procedure: SEGMENTAL FASCIECTOMY RIGHT RING FINGER;  Surgeon: Curia Arley, MD;  Location: [facility];  Service: Orthopedics;  Laterality: Right;  AXILLARY BLOCK   FLEXIBLE BRONCHOSCOPY Left 11/26/2019   Procedure: FLEXIBLE BRONCHOSCOPY WITH CRYO THERAPY;  Surgeon: Tamea Dedra CROME, MD;  Location: [facility];  Service: Cardiopulmonary;  Laterality: Left;   FLEXIBLE BRONCHOSCOPY Left 10/14/2021   Procedure: FLEXIBLE BRONCHOSCOPY;  Surgeon: Tamea Dedra CROME, MD;  Location: [facility];  Service: Pulmonary;  Laterality: Left;   IR ANGIOGRAM SELECTIVE EACH ADDITIONAL VESSEL  07/26/2017   IR ANGIOGRAM SELECTIVE EACH ADDITIONAL VESSEL  07/26/2017   IR ANGIOGRAM SELECTIVE EACH ADDITIONAL VESSEL  07/26/2017   IR ANGIOGRAM SELECTIVE EACH ADDITIONAL VESSEL  07/26/2017   IR ANGIOGRAM SELECTIVE EACH ADDITIONAL VESSEL  07/26/2017   IR ANGIOGRAM SELECTIVE EACH ADDITIONAL VESSEL  08/11/2017    IR ANGIOGRAM SELECTIVE EACH ADDITIONAL VESSEL  04/28/2018   IR ANGIOGRAM SELECTIVE EACH ADDITIONAL VESSEL  12/06/2022   IR ANGIOGRAM SELECTIVE EACH ADDITIONAL VESSEL  12/06/2022   IR ANGIOGRAM SELECTIVE EACH ADDITIONAL VESSEL  12/06/2022   IR ANGIOGRAM SELECTIVE EACH ADDITIONAL VESSEL  12/06/2022   IR ANGIOGRAM SELECTIVE EACH ADDITIONAL VESSEL  12/30/2022   IR ANGIOGRAM VISCERAL SELECTIVE  07/26/2017   IR ANGIOGRAM VISCERAL SELECTIVE  08/11/2017   IR ANGIOGRAM VISCERAL SELECTIVE  04/28/2018   IR ANGIOGRAM VISCERAL SELECTIVE  12/06/2022   IR ANGIOGRAM VISCERAL SELECTIVE  12/30/2022   IR EMBO ARTERIAL NOT HEMORR HEMANG INC GUIDE ROADMAPPING  07/26/2017   IR EMBO ARTERIAL NOT HEMORR HEMANG INC GUIDE ROADMAPPING  12/06/2022   IR EMBO TUMOR ORGAN ISCHEMIA INFARCT INC GUIDE ROADMAPPING  08/11/2017   IR EMBO TUMOR ORGAN ISCHEMIA INFARCT INC GUIDE ROADMAPPING  04/28/2018   IR EMBO TUMOR ORGAN ISCHEMIA INFARCT INC GUIDE ROADMAPPING  12/30/2022   IR RADIOLOGIST EVAL & MGMT  07/07/2017   IR RADIOLOGIST EVAL & MGMT  09/07/2017   IR RADIOLOGIST EVAL & MGMT  12/21/2017   IR RADIOLOGIST EVAL & MGMT  03/22/2018   IR RADIOLOGIST EVAL & MGMT  06/08/2018   IR RADIOLOGIST EVAL & MGMT  11/01/2018   IR RADIOLOGIST EVAL & MGMT  03/21/2019   IR RADIOLOGIST EVAL & MGMT  11/08/2019   IR RADIOLOGIST EVAL & MGMT  07/01/2020   IR RADIOLOGIST EVAL & MGMT  12/18/2020   IR RADIOLOGIST EVAL & MGMT  06/18/2021   IR RADIOLOGIST EVAL & MGMT  12/14/2021   IR RADIOLOGIST EVAL & MGMT  12/31/2021   IR US  GUIDE VASC ACCESS RIGHT  07/26/2017   IR US  GUIDE VASC ACCESS RIGHT  08/11/2017   IR US  GUIDE VASC ACCESS RIGHT  04/28/2018   IR US  GUIDE VASC ACCESS RIGHT  12/06/2022   IR US  GUIDE VASC ACCESS RIGHT  12/30/2022   LUNG LOBECTOMY  06/10/13   upper left lung   MULTIPLE TOOTH EXTRACTIONS     REMOVAL RETAINED LENS Right 11/17/2015   Procedure: REMOVAL RETAINED LENS FROAGMENTS RIGHT EYE;  Surgeon: Dene Etienne, MD;  Location: [facility];   Service: Ophthalmology;  Laterality: Right;   TONSILLECTOMY     UPPER GASTROINTESTINAL ENDOSCOPY  11-03-15   Dr Dessa   VASECTOMY     VIDEO ASSISTED THORACOSCOPY (VATS)/WEDGE RESECTION Left 06/04/2013   Procedure: VIDEO ASSISTED THORACOSCOPY (VATS)/WEDGE RESECTION;  Surgeon: Dallas KATHEE Jude, MD;  Location: [facility] OR;  Service: Thoracic;  Laterality: Left;   VIDEO BRONCHOSCOPY N/A 06/04/2013   Procedure: VIDEO BRONCHOSCOPY;  Surgeon: Dallas KATHEE Jude, MD;  Location: [facility] OR;  Service: Thoracic;  Laterality: N/A;   VIDEO BRONCHOSCOPY Left 08/12/2021   Procedure: VIDEO BRONCHOSCOPY WITHOUT FLUORO;  Surgeon: Tamea Dedra CROME, MD;  Location: [facility];  Service: Cardiopulmonary;  Laterality: Left;   VIDEO BRONCHOSCOPY Left 09/09/2021   Procedure: VIDEO BRONCHOSCOPY WITHOUT FLUORO;  Surgeon: Tamea Dedra CROME, MD;  Location: [facility];  Service: Cardiopulmonary;  Laterality: Left;    FAMILY HISTORY: Family History  Problem Relation Age of Onset   Stroke Mother    Alzheimer's disease Mother    Hypertension Sister    Hyperlipidemia Sister    Hypertension Brother    Hyperlipidemia Brother    Diabetes Brother     ADVANCED DIRECTIVES (Y/N):  N  HEALTH MAINTENANCE: Social History   Tobacco Use   Smoking status: Former    Current packs/day: 0.00    Average packs/day: 1 pack/day for 40.0 years (40.0 ttl pk-yrs)    Types: Cigarettes    Start date: 12/09/1958    Quit date: 12/09/1998    Years since quitting: 24.4    Passive exposure: Never   Smokeless tobacco: Never  Vaping Use   Vaping status: Never Used  Substance Use Topics   Alcohol use: No   Drug use: No     Colonoscopy:  PAP:  Bone density:  Lipid panel:  Allergies  Allergen Reactions   Tape Itching    Surgical tapes     Current Outpatient Medications  Medication Sig Dispense Refill   amLODipine  (NORVASC ) 5 MG tablet Take 5 mg in the morning and take another 5 mg in the afternoon as needed if BP >150/100 90 tablet 1    aspirin  EC 81 MG tablet Take 81 mg by mouth daily.     b complex vitamins capsule Take 1 capsule by mouth daily.     cholecalciferol (VITAMIN D3) 25 MCG (1000 UNIT) tablet Take 1,000 Units by mouth daily.     Cinnamon 500 MG capsule Take 500 mg by mouth at bedtime.     DM-APAP-CPM (CORICIDIN HBP PO) Take 2 tablets by mouth daily as needed (allergies/cold symptoms).  glucose blood (ONETOUCH VERIO) test strip USE 1 STRIP TO CHECK GLUCOSE ONCE DAILY 100 each 2   ipratropium (ATROVENT ) 0.03 % nasal spray USE 2 SPRAY(S) IN EACH NOSTRIL THREE TIMES DAILY 30 mL 11   losartan  (COZAAR ) 100 MG tablet Take 1 tablet (100 mg total) by mouth every morning. 90 tablet 1   Multiple Vitamins-Minerals (MULTIVITAMIN WITH MINERALS) tablet Take 1 tablet by mouth daily.     ondansetron  (ZOFRAN ) 8 MG tablet Take 1 tablet (8 mg total) by mouth 2 (two) times daily as needed for nausea or vomiting. 20 tablet 1   polycarbophil (FIBERCON) 625 MG tablet Take 625 mg by mouth 2 (two) times daily.     polyvinyl alcohol (LIQUIFILM TEARS) 1.4 % ophthalmic solution Place 1 drop into both eyes as needed for dry eyes.     rosuvastatin  (CRESTOR ) 20 MG tablet Take 1 tablet by mouth once daily 90 tablet 0   vitamin C (ASCORBIC ACID ) 500 MG tablet Take 500 mg by mouth daily.     zinc  sulfate 220 (50 Zn) MG capsule Take 1 capsule (220 mg total) by mouth daily. 30 capsule 0   No current facility-administered medications for this visit.   Facility-Administered Medications Ordered in Other Visits  Medication Dose Route Frequency Provider Last Rate Last Admin   lanreotide acetate  (SOMATULINE DEPOT ) injection 120 mg  120 mg Subcutaneous Once Kaya Pottenger J, MD        OBJECTIVE: Vitals:   05/12/23 1020  BP: 139/76  Pulse: (!) 59  Resp: 16  Temp: (!) 96.8 F (36 C)  SpO2: 99%     Body mass index is 26.43 kg/m.    ECOG FS:0 - Asymptomatic  General: Well-developed, well-nourished, no acute distress. Eyes: Pink conjunctiva,  anicteric sclera. HEENT: Normocephalic, moist mucous membranes. Lungs: No audible wheezing or coughing. Heart: Regular rate and rhythm. Abdomen: Soft, nontender, no obvious distention. Musculoskeletal: No edema, cyanosis, or clubbing. Neuro: Alert, answering all questions appropriately. Cranial nerves grossly intact. Skin: No rashes or petechiae noted. Psych: Normal affect.  LAB RESULTS:  Lab Results  Component Value Date   NA 140 05/12/2023   K 4.7 05/12/2023   CL 104 05/12/2023   CO2 27 05/12/2023   GLUCOSE 154 (H) 05/12/2023   BUN 24 (H) 05/12/2023   CREATININE 1.01 05/12/2023   CALCIUM  9.9 05/12/2023   PROT 7.4 05/12/2023   ALBUMIN  4.2 05/12/2023   AST 32 05/12/2023   ALT 35 05/12/2023   ALKPHOS 156 (H) 05/12/2023   BILITOT 0.8 05/12/2023   GFRNONAA >60 05/12/2023   GFRAA >60 01/29/2020    Lab Results  Component Value Date   WBC 8.2 05/12/2023   NEUTROABS 6.2 05/12/2023   HGB 16.9 05/12/2023   HCT 51.7 05/12/2023   MCV 92.2 05/12/2023   PLT 160 05/12/2023     STUDIES: No results found.  ONCOLOGY HISTORY: Patient's pathology, imaging, and outside facility notes reviewed extensively. Patient underwent left upper lobe lobectomy in [place] on June 04, 2013. Final pathology results revealed a well differentiated neuroendocrine tumor with positive bronchial margins. Patient did not receive adjuvant XRT or chemotherapy at that time.  His most recent Y 90 ablation to his liver occurred on April 28, 2018.   CT scan results from December 28, 2021  with 2 new foci noted in his liver likely consistent with progressive disease.  Patient subsequently underwent 4 treatments with Lutathera  completing on August 04, 2022.  ASSESSMENT: Stage IVA neuroendocrine tumor of the lung  metastatic to the liver.  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  Despite treatment with Lutathera , patient had no appreciable  change in his disease burden and potentially a new focus in his liver.  Patient underwent Y90 embolization on December 30, 2022.  Proceed with lanreotide today.  Despite having MRI of the liver on May 02, 2023, results are not available at time of dictation.  Return to clinic in 1 and 2 months for lanreotide only.  Patient will then return to clinic in 3 months with repeat laboratory, further evaluation, and continuation of treatment.   Left mainstem endobronchial mass: CT scan results noted.  Consistent with neuroendocrine tumor.  No plans for pulmonary intervention at this time.  Continue follow-up with them as scheduled. Nausea: Resolved.  Continue ondansetron  as needed. Thrombocytopenia: Resolved.  I spent a total of 30 minutes reviewing chart data, face-to-face evaluation with the patient, counseling and coordination of care as detailed above.  Patient expressed understanding and was in agreement with this plan. He also understands that He can call clinic at any time with any questions, concerns, or complaints.    Cancer Staging  Neuroendocrine carcinoma of lung [facility]) Staging form: Lung, AJCC 8th Edition - Clinical stage from 12/28/2016: Stage IVA (cT2a, cN0, cM1b) - Signed by [name], MD on 12/28/2016 Sites of metastasis: Liver   Evalene PARAS Jacobo, MD   05/12/2023 11:32 AM

## 2023-05-13 LAB — CHROMOGRANIN A: Chromogranin A (ng/mL): 2348 ng/mL — ABNORMAL HIGH (ref 0.0–101.8)

## 2023-05-16 ENCOUNTER — Telehealth: Payer: Self-pay | Admitting: *Deleted

## 2023-05-16 NOTE — Telephone Encounter (Signed)
-----   Message from Evalene JINNY Reusing sent at 05/16/2023  3:38 PM EST ----- I told patient we will call him with results.  Lesions are improved.  We will continue current treatment as planned. ----- Message ----- From: Interface, Rad Results In Sent: 05/16/2023   2:38 PM EST To: Evalene JINNY Reusing, MD

## 2023-05-16 NOTE — Telephone Encounter (Signed)
 Call placed to patient to review MRI results with patient. Patient appreciative of call and verbalized understanding.

## 2023-06-07 DIAGNOSIS — C44329 Squamous cell carcinoma of skin of other parts of face: Secondary | ICD-10-CM | POA: Diagnosis not present

## 2023-06-07 DIAGNOSIS — D485 Neoplasm of uncertain behavior of skin: Secondary | ICD-10-CM | POA: Diagnosis not present

## 2023-06-07 DIAGNOSIS — Z872 Personal history of diseases of the skin and subcutaneous tissue: Secondary | ICD-10-CM | POA: Diagnosis not present

## 2023-06-07 DIAGNOSIS — L578 Other skin changes due to chronic exposure to nonionizing radiation: Secondary | ICD-10-CM | POA: Diagnosis not present

## 2023-06-07 DIAGNOSIS — Z859 Personal history of malignant neoplasm, unspecified: Secondary | ICD-10-CM | POA: Diagnosis not present

## 2023-06-07 DIAGNOSIS — L570 Actinic keratosis: Secondary | ICD-10-CM | POA: Diagnosis not present

## 2023-06-10 ENCOUNTER — Other Ambulatory Visit: Payer: Self-pay | Admitting: *Deleted

## 2023-06-10 DIAGNOSIS — C7A8 Other malignant neuroendocrine tumors: Secondary | ICD-10-CM

## 2023-06-13 ENCOUNTER — Encounter: Payer: Self-pay | Admitting: Internal Medicine

## 2023-06-13 ENCOUNTER — Inpatient Hospital Stay: Payer: Medicare Other | Attending: Oncology

## 2023-06-13 ENCOUNTER — Inpatient Hospital Stay: Payer: Medicare Other

## 2023-06-13 ENCOUNTER — Ambulatory Visit: Payer: Medicare Other | Admitting: Internal Medicine

## 2023-06-13 ENCOUNTER — Other Ambulatory Visit: Payer: Self-pay

## 2023-06-13 ENCOUNTER — Other Ambulatory Visit: Payer: Self-pay | Admitting: Internal Medicine

## 2023-06-13 VITALS — BP 122/68 | HR 60 | Temp 97.9°F | Resp 16 | Ht 64.0 in | Wt 158.1 lb

## 2023-06-13 DIAGNOSIS — J9611 Chronic respiratory failure with hypoxia: Secondary | ICD-10-CM | POA: Diagnosis not present

## 2023-06-13 DIAGNOSIS — E782 Mixed hyperlipidemia: Secondary | ICD-10-CM | POA: Diagnosis not present

## 2023-06-13 DIAGNOSIS — E119 Type 2 diabetes mellitus without complications: Secondary | ICD-10-CM

## 2023-06-13 DIAGNOSIS — E1129 Type 2 diabetes mellitus with other diabetic kidney complication: Secondary | ICD-10-CM | POA: Diagnosis not present

## 2023-06-13 DIAGNOSIS — C7A090 Malignant carcinoid tumor of the bronchus and lung: Secondary | ICD-10-CM | POA: Diagnosis not present

## 2023-06-13 DIAGNOSIS — I4892 Unspecified atrial flutter: Secondary | ICD-10-CM

## 2023-06-13 DIAGNOSIS — I10 Essential (primary) hypertension: Secondary | ICD-10-CM

## 2023-06-13 DIAGNOSIS — C7B02 Secondary carcinoid tumors of liver: Secondary | ICD-10-CM | POA: Insufficient documentation

## 2023-06-13 DIAGNOSIS — C7A8 Other malignant neuroendocrine tumors: Secondary | ICD-10-CM

## 2023-06-13 DIAGNOSIS — Z79899 Other long term (current) drug therapy: Secondary | ICD-10-CM | POA: Insufficient documentation

## 2023-06-13 DIAGNOSIS — R809 Proteinuria, unspecified: Secondary | ICD-10-CM

## 2023-06-13 DIAGNOSIS — E1169 Type 2 diabetes mellitus with other specified complication: Secondary | ICD-10-CM | POA: Diagnosis not present

## 2023-06-13 LAB — CMP (CANCER CENTER ONLY)
ALT: 33 U/L (ref 0–44)
AST: 33 U/L (ref 15–41)
Albumin: 3.9 g/dL (ref 3.5–5.0)
Alkaline Phosphatase: 133 U/L — ABNORMAL HIGH (ref 38–126)
Anion gap: 3 — ABNORMAL LOW (ref 5–15)
BUN: 19 mg/dL (ref 8–23)
CO2: 27 mmol/L (ref 22–32)
Calcium: 9 mg/dL (ref 8.9–10.3)
Chloride: 107 mmol/L (ref 98–111)
Creatinine: 1.16 mg/dL (ref 0.61–1.24)
GFR, Estimated: >60 mL/min (ref >60)
Glucose, Bld: 128 mg/dL — ABNORMAL HIGH (ref 70–99)
Potassium: 4.9 mmol/L (ref 3.5–5.1)
Sodium: 137 mmol/L (ref 135–145)
Total Bilirubin: 0.8 mg/dL (ref 0.0–1.2)
Total Protein: 6.6 g/dL (ref 6.5–8.1)

## 2023-06-13 LAB — CBC WITH DIFFERENTIAL/PLATELET
Abs Immature Granulocytes: 0.04 10*3/uL (ref 0.00–0.07)
Basophils Absolute: 0.1 10*3/uL (ref 0.0–0.1)
Basophils Relative: 1 %
Eosinophils Absolute: 0.1 10*3/uL (ref 0.0–0.5)
Eosinophils Relative: 1 %
HCT: 50.2 % (ref 39.0–52.0)
Hemoglobin: 16.2 g/dL (ref 13.0–17.0)
Immature Granulocytes: 1 %
Lymphocytes Relative: 8 %
Lymphs Abs: 0.6 10*3/uL — ABNORMAL LOW (ref 0.7–4.0)
MCH: 29.5 pg (ref 26.0–34.0)
MCHC: 32.3 g/dL (ref 30.0–36.0)
MCV: 91.4 fL (ref 80.0–100.0)
Monocytes Absolute: 1 10*3/uL (ref 0.1–1.0)
Monocytes Relative: 12 %
Neutro Abs: 6.7 10*3/uL (ref 1.7–7.7)
Neutrophils Relative %: 77 %
Platelets: 152 10*3/uL (ref 150–400)
RBC: 5.49 MIL/uL (ref 4.22–5.81)
RDW: 14 % (ref 11.5–15.5)
WBC: 8.6 10*3/uL (ref 4.0–10.5)
nRBC: 0 % (ref 0.0–0.2)

## 2023-06-13 MED ORDER — LOSARTAN POTASSIUM 100 MG PO TABS: 100.0000 mg | ORAL_TABLET | ORAL | 1 refills | Status: DC

## 2023-06-13 MED ORDER — LANREOTIDE ACETATE 120 MG/0.5ML ~~LOC~~ SOLN
120.0000 mg | Freq: Once | SUBCUTANEOUS | Status: AC
  Administered 2023-06-13: 120 mg via SUBCUTANEOUS
  Filled 2023-06-13: qty 120

## 2023-06-13 NOTE — Assessment & Plan Note (Signed)
Recheck lipid panel today, on a statin.

## 2023-06-13 NOTE — Assessment & Plan Note (Signed)
 Recheck microalbumin today

## 2023-06-13 NOTE — Assessment & Plan Note (Signed)
Not on medications but controlled. Recheck A1c today, microalbumin and foot exam.

## 2023-06-13 NOTE — Assessment & Plan Note (Signed)
 Symptoms stable

## 2023-06-13 NOTE — Assessment & Plan Note (Signed)
Stable, following with Pulmonology.  ?

## 2023-06-13 NOTE — Progress Notes (Signed)
Established Patient Office Visit  Subjective   Patient ID: Jason Malone, male    DOB: [birth year]  Age: 83 y.o. MRN: [id number]  Chief Complaint  Patient presents with   Medical Management of Chronic Issues    6 month follow up    HPI  Jason Malone presents for follow up on chronic medical conditions. Overall he is doing well and had no questions or concerns today.  Hypertension/A.Flutter: -Medications: Losartan 100 mg, now taking Amlodipine 5 mg after his injection and a few days afterwards  -Patient is compliant with above medications and reports no side effects. -Denies any SOB, CP, vision changes or symptoms of hypotension.  HLD: -Medications: Crestor 20 mg -Patient is compliant with above medications and reports no side effects.  -Last lipid panel: Lipid Panel     Component Value Date/Time   CHOL 115 12/28/2021 1452   CHOL 133 02/14/2017 1046   TRIG 113 12/28/2021 1452   HDL 41 12/28/2021 1452   HDL 39 (L) 02/14/2017 1046   CHOLHDL 2.8 12/28/2021 1452   VLDL 9 06/15/2019 0114   LDLCALC 54 12/28/2021 1452   LABVLDL 24 02/14/2017 1046    Diabetes, Type 2: -Last A1c 7.0% 2/24 -Medications: Nothing currently - started on Metformin previously but could not tolerate -Eye exam: UTD 3/24 -Foot exam: Due -Microalbumin: Due -Statin: yes -PNA vaccine: yes -Denies symptoms of hypoglycemia, polyuria, polydipsia, numbness extremities, foot ulcers/trauma.   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: -Following with Pulmonology, IR and Oncology, last seen 05/12/23 -Now stage IVa, metastatic to liver   Health Maintenance: -Blood work due -Discussed RSV vaccine, patient declines   Patient Active Problem List   Diagnosis Date Noted   History of COVID-19 05/12/2020   Chronic respiratory failure with hypoxia (HCC) 05/12/2020   Postinflammatory pulmonary fibrosis (HCC) 05/12/2020   COPD mixed type (HCC) 05/12/2020   Decreased hearing 01/23/2020    Hyponatremia 07/03/2019   Metabolic acidosis 07/03/2019   BPH (benign prostatic hyperplasia) 07/03/2019   PNA (pneumonia) 07/03/2019   Diabetes mellitus type 2, controlled, with complications (HCC) 06/14/2019   HLD (hyperlipidemia) 06/14/2019   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   Respiratory failure (HCC) 06/11/2019   Elevated LFTs 06/11/2019   Pneumonia due to COVID-19 virus 06/10/2019   Contracture of palmar fascia 10/11/2018   Microalbuminuria due to type 2 diabetes mellitus (HCC) 04/12/2018   Type 2 diabetes mellitus (HCC) 04/10/2018   Osteopenia determined by x-ray 01/07/2017   Neuroendocrine carcinoma of lung (HCC) 12/28/2016   Low back pain 12/17/2016   Liver mass, left lobe 08/28/2016   Aortic atherosclerosis (HCC) 04/06/2016   Coronary artery disease involving native heart without angina pectoris 04/06/2016   History of colonic polyps 10/28/2015   Medication monitoring encounter 08/15/2015   Paroxysmal atrial flutter (HCC)    Elevated serum alkaline phosphatase level 03/05/2015   Allergic rhinitis    BPH without urinary obstruction    Hyperlipidemia 02/13/2014   Orthostasis 07/18/2013   Sinus bradycardia 07/18/2013   S/P lobectomy of lung 06/04/2013   Atrial flutter (HCC) 04/09/2013   Smoking history 04/09/2013   Essential hypertension 04/09/2013   Rectal bleeding 12/15/2012   Past Medical History:  Diagnosis Date   Allergic rhinitis    Aortic atherosclerosis (HCC)    Benign prostatic hypertrophy with lower urinary tract symptoms (LUTS)    Bradycardia    Chest pain    a. 05/2013 MV: EF 70%, no ischemia.   Chronic respiratory failure with hypoxia (HCC)  COPD (chronic obstructive pulmonary disease) (HCC)    Coronary artery disease    COVID-19    Diabetes mellitus without complication (HCC)    last a1c 6.5 09-16-21 no meds   Diverticulitis    DIVERTICULOSIS   Dysrhythmia    Elevated LFTs    Enlarged prostate    Essential hypertension    CONTROLLED ON MEDS   Full  dentures    H/O hypokalemia    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 08/28/2016   Hyperlipemia    Hypomagnesemia    IFG (impaired fasting glucose)    Liver cancer (HCC) 12/08/2016   Liver mass, left lobe 08/28/2016   Probably hemangioma; will get MRI of liver, refer to GI   Lung cancer (HCC)    a. carcinoid, left lung, Stage 1b (T2a, N0, cM0);  b. 05/2013 s/p VATS & LULobectomy.   Metabolic acidosis    Monocytosis    Osteopenia    Paroxysmal atrial flutter (HCC)    a. 03/2013->no recurrence;  b. CHA2DS2VASc = 2-->not currently on anticoagulation;  c. 05/2012 Echo: EF 55-60%, normal RV.   Pneumonia    Postinflammatory pulmonary fibrosis (HCC)    Rectal bleeding    Smoking history    Wears glasses    Past Surgical History:  Procedure Laterality Date   BRONCHOSCOPY     04/06/2013   CARDIOVASCULAR STRESS TEST  05/2013   a. No evidence of ischemia or infarct, EF 70%, no WMAs   CATARACT EXTRACTION W/PHACO Left 07/14/2015   Procedure: CATARACT EXTRACTION PHACO AND INTRAOCULAR LENS PLACEMENT (IOC);  Surgeon: Lockie Mola, MD;  Location: [facility];  Service: Ophthalmology;  Laterality: Left;   CATARACT EXTRACTION W/PHACO Right 10/01/2015   Procedure: CATARACT EXTRACTION PHACO AND INTRAOCULAR LENS PLACEMENT (IOC);  Surgeon: Lockie Mola, MD;  Location: [facility];  Service: Ophthalmology;  Laterality: Right;   COLONOSCOPY W/ POLYPECTOMY     bleed after-had to go to surgery to stop bleeding via colonoscopy   COLONOSCOPY WITH PROPOFOL N/A 11/19/2015   Procedure: COLONOSCOPY WITH PROPOFOL;  Surgeon: [name], MD;  Location: [facility];  Service: Endoscopy;  Laterality: N/A;   DUPUYTREN CONTRACTURE RELEASE  12/15/2011   Procedure: DUPUYTREN CONTRACTURE RELEASE;  Surgeon: Nicki Reaper, MD;  Location: [facility];  Service: Orthopedics;  Laterality: Left;  Fasciotomy left ring finger dupuytrens   ELECTROMAGNETIC NAVIGATION  BROCHOSCOPY Left 09/19/2019   Procedure: ELECTROMAGNETIC NAVIGATION BRONCHOSCOPY;  Surgeon: Salena Saner, MD;  Location: [facility];  Service: Cardiopulmonary;  Laterality: Left;   FASCIECTOMY Right 11/07/2018   Procedure: SEGMENTAL FASCIECTOMY RIGHT RING FINGER;  Surgeon: Cindee Salt, MD;  Location: [facility];  Service: Orthopedics;  Laterality: Right;  AXILLARY BLOCK   FLEXIBLE BRONCHOSCOPY Left 11/26/2019   Procedure: FLEXIBLE BRONCHOSCOPY WITH CRYO THERAPY;  Surgeon: Salena Saner, MD;  Location: [facility];  Service: Cardiopulmonary;  Laterality: Left;   FLEXIBLE BRONCHOSCOPY Left 10/14/2021   Procedure: FLEXIBLE BRONCHOSCOPY;  Surgeon: Salena Saner, MD;  Location: [facility];  Service: Pulmonary;  Laterality: Left;   IR ANGIOGRAM SELECTIVE EACH ADDITIONAL VESSEL  07/26/2017   IR ANGIOGRAM SELECTIVE EACH ADDITIONAL VESSEL  07/26/2017   IR ANGIOGRAM SELECTIVE EACH ADDITIONAL VESSEL  07/26/2017   IR ANGIOGRAM SELECTIVE EACH ADDITIONAL VESSEL  07/26/2017   IR ANGIOGRAM SELECTIVE EACH ADDITIONAL VESSEL  07/26/2017   IR ANGIOGRAM SELECTIVE EACH ADDITIONAL VESSEL  08/11/2017   IR ANGIOGRAM SELECTIVE EACH ADDITIONAL VESSEL  04/28/2018  IR ANGIOGRAM SELECTIVE EACH ADDITIONAL VESSEL  12/06/2022   IR ANGIOGRAM SELECTIVE EACH ADDITIONAL VESSEL  12/06/2022   IR ANGIOGRAM SELECTIVE EACH ADDITIONAL VESSEL  12/06/2022   IR ANGIOGRAM SELECTIVE EACH ADDITIONAL VESSEL  12/06/2022   IR ANGIOGRAM SELECTIVE EACH ADDITIONAL VESSEL  12/30/2022   IR ANGIOGRAM VISCERAL SELECTIVE  07/26/2017   IR ANGIOGRAM VISCERAL SELECTIVE  08/11/2017   IR ANGIOGRAM VISCERAL SELECTIVE  04/28/2018   IR ANGIOGRAM VISCERAL SELECTIVE  12/06/2022   IR ANGIOGRAM VISCERAL SELECTIVE  12/30/2022   IR EMBO ARTERIAL NOT HEMORR HEMANG INC GUIDE ROADMAPPING  07/26/2017   IR EMBO ARTERIAL NOT HEMORR HEMANG INC GUIDE ROADMAPPING  12/06/2022   IR EMBO TUMOR ORGAN ISCHEMIA INFARCT INC GUIDE ROADMAPPING  08/11/2017   IR EMBO TUMOR ORGAN  ISCHEMIA INFARCT INC GUIDE ROADMAPPING  04/28/2018   IR EMBO TUMOR ORGAN ISCHEMIA INFARCT INC GUIDE ROADMAPPING  12/30/2022   IR RADIOLOGIST EVAL & MGMT  07/07/2017   IR RADIOLOGIST EVAL & MGMT  09/07/2017   IR RADIOLOGIST EVAL & MGMT  12/21/2017   IR RADIOLOGIST EVAL & MGMT  03/22/2018   IR RADIOLOGIST EVAL & MGMT  06/08/2018   IR RADIOLOGIST EVAL & MGMT  11/01/2018   IR RADIOLOGIST EVAL & MGMT  03/21/2019   IR RADIOLOGIST EVAL & MGMT  11/08/2019   IR RADIOLOGIST EVAL & MGMT  07/01/2020   IR RADIOLOGIST EVAL & MGMT  12/18/2020   IR RADIOLOGIST EVAL & MGMT  06/18/2021   IR RADIOLOGIST EVAL & MGMT  12/14/2021   IR RADIOLOGIST EVAL & MGMT  12/31/2021   IR US GUIDE VASC ACCESS RIGHT  07/26/2017   IR US GUIDE VASC ACCESS RIGHT  08/11/2017   IR US GUIDE VASC ACCESS RIGHT  04/28/2018   IR US GUIDE VASC ACCESS RIGHT  12/06/2022   IR US GUIDE VASC ACCESS RIGHT  12/30/2022   LUNG LOBECTOMY  06/10/13   upper left lung   MULTIPLE TOOTH EXTRACTIONS     REMOVAL RETAINED LENS Right 11/17/2015   Procedure: REMOVAL RETAINED LENS FROAGMENTS RIGHT EYE;  Surgeon: Lockie Mola, MD;  Location: [facility];  Service: Ophthalmology;  Laterality: Right;   TONSILLECTOMY     UPPER GASTROINTESTINAL ENDOSCOPY  11-03-15   Dr Lemar Livings   VASECTOMY     VIDEO ASSISTED THORACOSCOPY (VATS)/WEDGE RESECTION Left 06/04/2013   Procedure: VIDEO ASSISTED THORACOSCOPY (VATS)/WEDGE RESECTION;  Surgeon: Delight Ovens, MD;  Location: [facility] OR;  Service: Thoracic;  Laterality: Left;   VIDEO BRONCHOSCOPY N/A 06/04/2013   Procedure: VIDEO BRONCHOSCOPY;  Surgeon: Delight Ovens, MD;  Location: [facility];  Service: Thoracic;  Laterality: N/A;   VIDEO BRONCHOSCOPY Left 08/12/2021   Procedure: VIDEO BRONCHOSCOPY WITHOUT FLUORO;  Surgeon: Salena Saner, MD;  Location: [facility];  Service: Cardiopulmonary;  Laterality: Left;   VIDEO BRONCHOSCOPY Left 09/09/2021   Procedure: VIDEO BRONCHOSCOPY WITHOUT FLUORO;  Surgeon: Salena Saner, MD;   Location: [facility];  Service: Cardiopulmonary;  Laterality: Left;   Social History   Tobacco Use   Smoking status: Former    Current packs/day: 0.00    Average packs/day: 1 pack/day for 40.0 years (40.0 ttl pk-yrs)    Types: Cigarettes    Start date: 12/09/1958    Quit date: 12/09/1998    Years since quitting: 24.5    Passive exposure: Never   Smokeless tobacco: Never  Vaping Use   Vaping status: Never Used  Substance Use Topics   Alcohol use: No   Drug use:  No   Social History   Socioeconomic History   Marital status: Widowed    Spouse name: Talbert Forest   Number of children: 3   Years of education: Not on file   Highest education level: 12th grade  Occupational History   Not on file  Tobacco Use   Smoking status: Former    Current packs/day: 0.00    Average packs/day: 1 pack/day for 40.0 years (40.0 ttl pk-yrs)    Types: Cigarettes    Start date: 12/09/1958    Quit date: 12/09/1998    Years since quitting: 24.5    Passive exposure: Never   Smokeless tobacco: Never  Vaping Use   Vaping status: Never Used  Substance and Sexual Activity   Alcohol use: No   Drug use: No   Sexual activity: Yes    Partners: Female  Other Topics Concern   Not on file  Social History Narrative   Not on file   Social Drivers of Health   Financial Resource Strain: Low Risk  (06/08/2023)   Overall Financial Resource Strain (CARDIA)    Difficulty of Paying Living Expenses: Not hard at all  Food Insecurity: No Food Insecurity (06/08/2023)   Hunger Vital Sign    Worried About Running Out of Food in the Last Year: Never true    Ran Out of Food in the Last Year: Never true  Transportation Needs: No Transportation Needs (06/08/2023)   PRAPARE - Administrator, Civil Service (Medical): No    Lack of Transportation (Non-Medical): No  Physical Activity: Insufficiently Active (06/08/2023)   Exercise Vital Sign    Days of Exercise per Week: 6 days    Minutes of Exercise per Session: 20 min   Stress: No Stress Concern Present (06/08/2023)   [employer] of Occupational Health - Occupational Stress Questionnaire    Feeling of Stress : Not at all  Social Connections: Moderately Isolated (06/08/2023)   Social Connection and Isolation Panel [NHANES]    Frequency of Communication with Friends and Family: More than three times a week    Frequency of Social Gatherings with Friends and Family: More than three times a week    Attends Religious Services: More than 4 times per year    Active Member of [employer] or Organizations: No    Attends Banker Meetings: Not on file    Marital Status: Widowed  Intimate Partner Violence: Not At Risk (06/12/2018)   Humiliation, Afraid, Rape, and Kick questionnaire    Fear of Current or Ex-Partner: No    Emotionally Abused: No    Physically Abused: No    Sexually Abused: No   Family Status  Relation Name Status   Mother  Deceased   Sister  Alive   Brother half Alive       diabetes   Father  Deceased       killed in WWII   Brother half Alive   Sister  Deceased       cancer   Son  Alive   Son  Alive   Son  Deceased  No partnership data on file   Family History  Problem Relation Age of Onset   Stroke Mother    Alzheimer's disease Mother    Hypertension Sister    Hyperlipidemia Sister    Hypertension Brother    Hyperlipidemia Brother    Diabetes Brother    Allergies  Allergen Reactions   Tape Itching    Surgical tapes  Review of Systems  All other systems reviewed and are negative.     Objective:     BP 122/68 (Cuff Size: Normal)   Pulse 60   Temp 97.9 F (36.6 C) (Oral)   Resp 16   Ht 5\' 4"  (1.626 m)   Wt 158 lb 1.6 oz (71.7 kg)   SpO2 99%   BMI 27.14 kg/m  BP Readings from Last 3 Encounters:  06/13/23 122/68  05/12/23 139/76  05/09/23 138/82   Wt Readings from Last 3 Encounters:  06/13/23 158 lb 1.6 oz (71.7 kg)  05/12/23 154 lb (69.9 kg)  05/09/23 159 lb 12.8 oz (72.5 kg)       Physical Exam Constitutional:      Appearance: Normal appearance.  HENT:     Head: Normocephalic and atraumatic.  Eyes:     Conjunctiva/sclera: Conjunctivae normal.  Cardiovascular:     Rate and Rhythm: Normal rate and regular rhythm.     Pulses:          Dorsalis pedis pulses are 2+ on the right side and 2+ on the left side.  Pulmonary:     Effort: Pulmonary effort is normal.     Breath sounds: Normal breath sounds.  Musculoskeletal:     Right foot: Normal range of motion. No deformity, bunion, Charcot foot, foot drop or prominent metatarsal heads.     Left foot: Normal range of motion. No deformity, bunion, Charcot foot, foot drop or prominent metatarsal heads.  Feet:     Right foot:     Protective Sensation: 6 sites tested.  6 sites sensed.     Skin integrity: Skin integrity normal.     Toenail Condition: Right toenails are normal.     Left foot:     Protective Sensation: 6 sites tested.  6 sites sensed.     Skin integrity: Skin integrity normal.     Toenail Condition: Left toenails are normal.  Skin:    General: Skin is warm and dry.  Neurological:     General: No focal deficit present.     Mental Status: He is alert. Mental status is at baseline.  Psychiatric:        Mood and Affect: Mood normal.        Behavior: Behavior normal.      No results found for any visits on 06/13/23.  Last CBC Lab Results  Component Value Date   WBC 8.2 05/12/2023   HGB 16.9 05/12/2023   HCT 51.7 05/12/2023   MCV 92.2 05/12/2023   MCH 30.1 05/12/2023   RDW 13.9 05/12/2023   PLT 160 05/12/2023   Last metabolic panel Lab Results  Component Value Date   GLUCOSE 154 (H) 05/12/2023   NA 140 05/12/2023   K 4.7 05/12/2023   CL 104 05/12/2023   CO2 27 05/12/2023   BUN 24 (H) 05/12/2023   CREATININE 1.01 05/12/2023   GFRNONAA >60 05/12/2023   CALCIUM 9.9 05/12/2023   PHOS 3.2 06/17/2019   PROT 7.4 05/12/2023   ALBUMIN 4.2 05/12/2023   LABGLOB 2.2 12/17/2016   AGRATIO 2.0  12/17/2016   BILITOT 0.8 05/12/2023   ALKPHOS 156 (H) 05/12/2023   AST 32 05/12/2023   ALT 35 05/12/2023   ANIONGAP 9 05/12/2023   Last lipids Lab Results  Component Value Date   CHOL 115 12/28/2021   HDL 41 12/28/2021   LDLCALC 54 12/28/2021   TRIG 113 12/28/2021   CHOLHDL 2.8 12/28/2021   Last hemoglobin A1c  Lab Results  Component Value Date   HGBA1C 6.7 (A) 12/09/2022   Last thyroid functions Lab Results  Component Value Date   TSH 4.221 06/08/2013   Last vitamin D No results found for: "25OHVITD2", "25OHVITD3", "VD25OH" Last vitamin B12 and Folate No results found for: "VITAMINB12", "FOLATE"    The ASCVD Risk score (Arnett DK, et al., 2019) failed to calculate for the following reasons:   The 2019 ASCVD risk score is only valid for ages 44 to 36    Assessment & Plan:  Essential hypertension Assessment & Plan: Blood pressure stable here today, no changes made to medications and appropriate refills sent to pharmacy.    Orders: -     Losartan Potassium; Take 1 tablet (100 mg total) by mouth every morning.  Dispense: 90 tablet; Refill: 1  Mixed hyperlipidemia Assessment & Plan: Recheck lipid panel today, on a statin.  Orders: -     Lipid panel; Future  Type 2 diabetes mellitus without complication, without long-term current use of insulin (HCC) Assessment & Plan: Not on medications but controlled. Recheck A1c today, microalbumin and foot exam.   Orders: -     Hemoglobin A1c; Future -     Microalbumin / creatinine urine ratio -     HM Diabetes Foot Exam  Chronic respiratory failure with hypoxia (HCC) Assessment & Plan: Stable, following with Pulmonology.    Atrial flutter, unspecified type [facility]) Assessment & Plan: Symptoms stable.    Microalbuminuria due to type 2 diabetes mellitus (HCC) Assessment & Plan: Recheck microalbumin today.      Return in about 6 months (around 12/11/2023).    Margarita Mail, DO

## 2023-06-13 NOTE — Assessment & Plan Note (Signed)
 Blood pressure stable here today, no changes made to medications and appropriate refills sent to pharmacy.

## 2023-06-14 ENCOUNTER — Encounter: Payer: Self-pay | Admitting: Internal Medicine

## 2023-06-14 LAB — MICROALBUMIN / CREATININE URINE RATIO
Creatinine, Urine: 136 mg/dL (ref 20–320)
Microalb Creat Ratio: 54 mg/g{creat} — ABNORMAL HIGH (ref <30)
Microalb, Ur: 7.4 mg/dL

## 2023-06-14 NOTE — Telephone Encounter (Signed)
 Requested Prescriptions  Pending Prescriptions Disp Refills   glucose blood (ONETOUCH VERIO) test strip [Pharmacy Med Name: ONETOUCH VERIO TES] 100 each 0    Sig: USE 1 STRIP TO CHECK GLUCOSE ONCE DAILY     Endocrinology: Diabetes - Testing Supplies Passed - 06/14/2023  2:05 PM      Passed - Valid encounter within last 12 months    Recent Outpatient Visits           6 months ago Type 2 diabetes mellitus without complication, without long-term current use of insulin  [facility])   [place] [facility] [name], DO   9 months ago Essential hypertension   [facility] [name], DO   11 months ago Type 2 diabetes mellitus without complication, without long-term current use of insulin  [facility])   [place] [facility] [name], DO   1 year ago Essential hypertension   [place] [facility] [name], DO   1 year ago Essential hypertension   [place] [facility] [name], DO       Future Appointments             In 2 months Jason Dedra CROME, MD [facility] at [place]   In 6 months [name], DO [facility], [facility]

## 2023-06-15 LAB — CHROMOGRANIN A: Chromogranin A (ng/mL): 1358 ng/mL — ABNORMAL HIGH (ref 0.0–101.8)

## 2023-06-20 ENCOUNTER — Other Ambulatory Visit: Payer: Self-pay

## 2023-06-20 DIAGNOSIS — E782 Mixed hyperlipidemia: Secondary | ICD-10-CM

## 2023-06-20 DIAGNOSIS — E119 Type 2 diabetes mellitus without complications: Secondary | ICD-10-CM

## 2023-06-21 LAB — HEMOGLOBIN A1C
Hgb A1c MFr Bld: 7.4 %{Hb} — ABNORMAL HIGH (ref <5.7)
Mean Plasma Glucose: 166 mg/dL
eAG (mmol/L): 9.2 mmol/L

## 2023-06-21 LAB — LIPID PANEL
Cholesterol: 110 mg/dL (ref <200)
HDL: 40 mg/dL (ref >=40)
LDL Cholesterol (Calc): 48 mg/dL
Non-HDL Cholesterol (Calc): 70 mg/dL (ref <130)
Total CHOL/HDL Ratio: 2.8 (calc) (ref <5.0)
Triglycerides: 136 mg/dL (ref <150)

## 2023-06-28 DIAGNOSIS — C44320 Squamous cell carcinoma of skin of unspecified parts of face: Secondary | ICD-10-CM | POA: Diagnosis not present

## 2023-06-28 DIAGNOSIS — C44329 Squamous cell carcinoma of skin of other parts of face: Secondary | ICD-10-CM | POA: Diagnosis not present

## 2023-07-08 ENCOUNTER — Other Ambulatory Visit: Payer: Self-pay | Admitting: *Deleted

## 2023-07-08 DIAGNOSIS — C7A8 Other malignant neuroendocrine tumors: Secondary | ICD-10-CM

## 2023-07-11 ENCOUNTER — Inpatient Hospital Stay: Payer: Medicare Other | Attending: Oncology

## 2023-07-11 ENCOUNTER — Inpatient Hospital Stay: Payer: Medicare Other

## 2023-07-11 VITALS — BP 155/68 | HR 65 | Temp 97.1°F | Resp 17

## 2023-07-11 DIAGNOSIS — C7A090 Malignant carcinoid tumor of the bronchus and lung: Secondary | ICD-10-CM | POA: Insufficient documentation

## 2023-07-11 DIAGNOSIS — C7A8 Other malignant neuroendocrine tumors: Secondary | ICD-10-CM

## 2023-07-11 DIAGNOSIS — Z79899 Other long term (current) drug therapy: Secondary | ICD-10-CM | POA: Diagnosis not present

## 2023-07-11 DIAGNOSIS — C7B02 Secondary carcinoid tumors of liver: Secondary | ICD-10-CM | POA: Diagnosis not present

## 2023-07-11 LAB — CBC WITH DIFFERENTIAL/PLATELET
Abs Immature Granulocytes: 0.04 10*3/uL (ref 0.00–0.07)
Basophils Absolute: 0.1 10*3/uL (ref 0.0–0.1)
Basophils Relative: 1 %
Eosinophils Absolute: 0.1 10*3/uL (ref 0.0–0.5)
Eosinophils Relative: 1 %
HCT: 48.7 % (ref 39.0–52.0)
Hemoglobin: 16 g/dL (ref 13.0–17.0)
Immature Granulocytes: 1 %
Lymphocytes Relative: 7 %
Lymphs Abs: 0.6 10*3/uL — ABNORMAL LOW (ref 0.7–4.0)
MCH: 30.4 pg (ref 26.0–34.0)
MCHC: 32.9 g/dL (ref 30.0–36.0)
MCV: 92.4 fL (ref 80.0–100.0)
Monocytes Absolute: 1.2 10*3/uL — ABNORMAL HIGH (ref 0.1–1.0)
Monocytes Relative: 14 %
Neutro Abs: 6.7 10*3/uL (ref 1.7–7.7)
Neutrophils Relative %: 76 %
Platelets: 160 10*3/uL (ref 150–400)
RBC: 5.27 MIL/uL (ref 4.22–5.81)
RDW: 14.4 % (ref 11.5–15.5)
WBC: 8.8 10*3/uL (ref 4.0–10.5)
nRBC: 0 % (ref 0.0–0.2)

## 2023-07-11 LAB — CMP (CANCER CENTER ONLY)
ALT: 25 U/L (ref 0–44)
AST: 25 U/L (ref 15–41)
Albumin: 4.1 g/dL (ref 3.5–5.0)
Alkaline Phosphatase: 130 U/L — ABNORMAL HIGH (ref 38–126)
Anion gap: 9 (ref 5–15)
BUN: 20 mg/dL (ref 8–23)
CO2: 27 mmol/L (ref 22–32)
Calcium: 9.6 mg/dL (ref 8.9–10.3)
Chloride: 104 mmol/L (ref 98–111)
Creatinine: 0.98 mg/dL (ref 0.61–1.24)
GFR, Estimated: >60 mL/min (ref >60)
Glucose, Bld: 112 mg/dL — ABNORMAL HIGH (ref 70–99)
Potassium: 4.9 mmol/L (ref 3.5–5.1)
Sodium: 140 mmol/L (ref 135–145)
Total Bilirubin: 0.8 mg/dL (ref 0.0–1.2)
Total Protein: 7.1 g/dL (ref 6.5–8.1)

## 2023-07-11 MED ORDER — LANREOTIDE ACETATE 120 MG/0.5ML ~~LOC~~ SOLN
120.0000 mg | Freq: Once | SUBCUTANEOUS | Status: AC
Start: 2023-07-11 — End: 2023-07-11
  Administered 2023-07-11: 120 mg via SUBCUTANEOUS
  Filled 2023-07-11: qty 120

## 2023-07-13 LAB — CHROMOGRANIN A: Chromogranin A (ng/mL): 1578 ng/mL — ABNORMAL HIGH (ref 0.0–101.8)

## 2023-07-20 ENCOUNTER — Other Ambulatory Visit: Payer: Self-pay | Admitting: Internal Medicine

## 2023-07-20 DIAGNOSIS — E782 Mixed hyperlipidemia: Secondary | ICD-10-CM

## 2023-07-20 NOTE — Telephone Encounter (Signed)
 Requested Prescriptions  Pending Prescriptions Disp Refills   rosuvastatin (CRESTOR) 20 MG tablet [Pharmacy Med Name: Rosuvastatin Calcium 20 MG Oral Tablet] 90 tablet 1    Sig: Take 1 tablet by mouth once daily     Cardiovascular:  Antilipid - Statins 2 Failed - 07/20/2023  5:14 PM      Failed - Lipid Panel in normal range within the last 12 months    Cholesterol, Total  Date Value Ref Range Status  02/14/2017 133 100 - 199 mg/dL Final   Cholesterol  Date Value Ref Range Status  06/20/2023 110 <200 mg/dL Final   LDL Cholesterol (Calc)  Date Value Ref Range Status  06/20/2023 48 mg/dL (calc) Final    Comment:    Reference range: <100 . Desirable range <100 mg/dL for primary prevention;   <70 mg/dL for patients with CHD or diabetic patients  with > or = 2 CHD risk factors. [name] LDL-C is now calculated using the Martin-Hopkins  calculation, which is a validated novel method providing  better accuracy than the Friedewald equation in the  estimation of LDL-C.  Horald Pollen et al. Lenox Ahr. 1610;960(45): 2061-2068  (http://education.[facility].com/faq/FAQ164)    HDL  Date Value Ref Range Status  06/20/2023 40 > OR = 40 mg/dL Final  40/98/1191 39 (L) >39 mg/dL Final    Comment:    **Effective February 28, 2017, HDL Cholesterol**   reference interval will be changing to:                                   Male        Male                               70 - [phone]    Triglycerides  Date Value Ref Range Status  06/20/2023 136 <150 mg/dL Final         Passed - Cr in normal range and within 360 days    Creatinine  Date Value Ref Range Status  07/11/2023 0.98 0.61 - 1.24 mg/dL Final   Creat  Date Value Ref Range Status  02/12/2016 0.88 0.70 - 1.18 mg/dL Final    Comment:      For patients > or = 81 years of age: The upper reference limit for Creatinine is approximately 13% higher for people identified as African-American.      Creatinine, Urine  Date  Value Ref Range Status  06/13/2023 136 20 - 320 mg/dL Final         Passed - Patient is not pregnant      Passed - Valid encounter within last 12 months    Recent Outpatient Visits           7 months ago Type 2 diabetes mellitus without complication, without long-term current use of insulin [facility])   [place] [facility] Margarita Mail, DO   10 months ago Essential hypertension   [place] [facility] Margarita Mail, DO   1 year ago Type 2 diabetes mellitus without complication, without long-term current use of insulin [facility])   [place] [facility] Margarita Mail, DO   1 year ago Essential hypertension   [place] [facility] Margarita Mail, DO   1 year ago Essential hypertension   [facility] Margarita Mail, [place]  Future Appointments             In 1 month Salena Saner, MD [facility] Pulmonary Care at [place]   In 4 months Margarita Mail, DO [facility]

## 2023-08-02 DIAGNOSIS — E119 Type 2 diabetes mellitus without complications: Secondary | ICD-10-CM | POA: Diagnosis not present

## 2023-08-02 DIAGNOSIS — Z961 Presence of intraocular lens: Secondary | ICD-10-CM | POA: Diagnosis not present

## 2023-08-02 DIAGNOSIS — H43813 Vitreous degeneration, bilateral: Secondary | ICD-10-CM | POA: Diagnosis not present

## 2023-08-02 DIAGNOSIS — H35373 Puckering of macula, bilateral: Secondary | ICD-10-CM | POA: Diagnosis not present

## 2023-08-12 ENCOUNTER — Other Ambulatory Visit: Payer: Medicare Other

## 2023-08-12 ENCOUNTER — Ambulatory Visit: Payer: Medicare Other | Admitting: Oncology

## 2023-08-12 ENCOUNTER — Other Ambulatory Visit: Payer: Self-pay | Admitting: *Deleted

## 2023-08-12 ENCOUNTER — Ambulatory Visit: Payer: Medicare Other

## 2023-08-12 DIAGNOSIS — C7A8 Other malignant neuroendocrine tumors: Secondary | ICD-10-CM

## 2023-08-15 ENCOUNTER — Inpatient Hospital Stay: Payer: Medicare Other | Admitting: Oncology

## 2023-08-15 ENCOUNTER — Inpatient Hospital Stay: Payer: Medicare Other

## 2023-08-15 ENCOUNTER — Inpatient Hospital Stay: Payer: Medicare Other | Attending: Oncology

## 2023-08-15 ENCOUNTER — Encounter: Payer: Self-pay | Admitting: Oncology

## 2023-08-15 VITALS — BP 169/85 | HR 57 | Temp 98.0°F | Resp 16 | Ht 64.0 in | Wt 157.1 lb

## 2023-08-15 DIAGNOSIS — Z79899 Other long term (current) drug therapy: Secondary | ICD-10-CM | POA: Diagnosis not present

## 2023-08-15 DIAGNOSIS — C7A090 Malignant carcinoid tumor of the bronchus and lung: Secondary | ICD-10-CM | POA: Insufficient documentation

## 2023-08-15 DIAGNOSIS — C7B02 Secondary carcinoid tumors of liver: Secondary | ICD-10-CM | POA: Insufficient documentation

## 2023-08-15 DIAGNOSIS — C7A8 Other malignant neuroendocrine tumors: Secondary | ICD-10-CM

## 2023-08-15 LAB — CBC WITH DIFFERENTIAL/PLATELET
Abs Immature Granulocytes: 0.06 10*3/uL (ref 0.00–0.07)
Basophils Absolute: 0.1 10*3/uL (ref 0.0–0.1)
Basophils Relative: 1 %
Eosinophils Absolute: 0.1 10*3/uL (ref 0.0–0.5)
Eosinophils Relative: 1 %
HCT: 47.5 % (ref 39.0–52.0)
Hemoglobin: 15.6 g/dL (ref 13.0–17.0)
Immature Granulocytes: 1 %
Lymphocytes Relative: 6 %
Lymphs Abs: 0.6 10*3/uL — ABNORMAL LOW (ref 0.7–4.0)
MCH: 30.6 pg (ref 26.0–34.0)
MCHC: 32.8 g/dL (ref 30.0–36.0)
MCV: 93.1 fL (ref 80.0–100.0)
Monocytes Absolute: 1.3 10*3/uL — ABNORMAL HIGH (ref 0.1–1.0)
Monocytes Relative: 13 %
Neutro Abs: 7.3 10*3/uL (ref 1.7–7.7)
Neutrophils Relative %: 78 %
Platelets: 162 10*3/uL (ref 150–400)
RBC: 5.1 MIL/uL (ref 4.22–5.81)
RDW: 14.6 % (ref 11.5–15.5)
WBC: 9.4 10*3/uL (ref 4.0–10.5)
nRBC: 0 % (ref 0.0–0.2)

## 2023-08-15 LAB — CMP (CANCER CENTER ONLY)
ALT: 23 U/L (ref 0–44)
AST: 23 U/L (ref 15–41)
Albumin: 3.7 g/dL (ref 3.5–5.0)
Alkaline Phosphatase: 131 U/L — ABNORMAL HIGH (ref 38–126)
Anion gap: 4 — ABNORMAL LOW (ref 5–15)
BUN: 16 mg/dL (ref 8–23)
CO2: 25 mmol/L (ref 22–32)
Calcium: 9 mg/dL (ref 8.9–10.3)
Chloride: 106 mmol/L (ref 98–111)
Creatinine: 0.99 mg/dL (ref 0.61–1.24)
GFR, Estimated: >60 mL/min (ref >60)
Glucose, Bld: 109 mg/dL — ABNORMAL HIGH (ref 70–99)
Potassium: 4.4 mmol/L (ref 3.5–5.1)
Sodium: 135 mmol/L (ref 135–145)
Total Bilirubin: 0.7 mg/dL (ref 0.0–1.2)
Total Protein: 6.5 g/dL (ref 6.5–8.1)

## 2023-08-15 MED ORDER — LANREOTIDE ACETATE 120 MG/0.5ML ~~LOC~~ SOLN
120.0000 mg | Freq: Once | SUBCUTANEOUS | Status: AC
  Administered 2023-08-15: 120 mg via SUBCUTANEOUS
  Filled 2023-08-15: qty 120

## 2023-08-15 NOTE — Progress Notes (Signed)
 [facility]  [phone]) [phone] [phone]) [phone]  ID: Jason Malone OB: Dec 22, 1940  MR#: [id number]  ION#:[id number]  Patient Care Team: Margarita Mail, DO as PCP - General (Internal Medicine) Antonieta Iba, MD as Consulting Physician (Cardiology) Jeralyn Ruths, MD as Consulting Physician (Oncology)   CHIEF COMPLAINT: 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  INTERVAL HISTORY: Patient returns to clinic today for further evaluation and continuation of lanreotide.  He continues to feel well and remains asymptomatic.  He does not complain of any weakness or fatigue. He has no neurologic complaints.  He denies any recent fevers or illnesses.  He has a good appetite and denies weight loss.  He denies any chest pain, shortness of breath, or hemoptysis.  He denies any abdominal pain.  He has no nausea, vomiting, constipation, or diarrhea.  He has no urinary complaints.  Patient offers no specific complaints today.  REVIEW OF SYSTEMS:   Review of Systems  Constitutional: Negative.  Negative for fever, malaise/fatigue and weight loss.  Respiratory: Negative.  Negative for cough, hemoptysis and shortness of breath.   Cardiovascular: Negative.  Negative for chest pain and leg swelling.  Gastrointestinal: Negative.  Negative for abdominal pain, diarrhea, nausea and vomiting.  Genitourinary: Negative.  Negative for dysuria.  Musculoskeletal: Negative.  Negative for back pain.  Skin: Negative.  Negative for rash.  Neurological: Negative.  Negative for dizziness, sensory change, focal weakness and weakness.  Psychiatric/Behavioral: Negative.  The patient is not nervous/anxious.     As per HPI. Otherwise, a complete review of systems is negative.  PAST MEDICAL HISTORY: Past Medical History:  Diagnosis Date   Allergic rhinitis    Aortic atherosclerosis (HCC)    Benign prostatic hypertrophy with lower urinary tract symptoms (LUTS)     Bradycardia    Chest pain    a. 05/2013 MV: EF 70%, no ischemia.   Chronic respiratory failure with hypoxia (HCC)    COPD (chronic obstructive pulmonary disease) (HCC)    Coronary artery disease    COVID-19    Diabetes mellitus without complication (HCC)    last a1c 6.5 09-16-21 no meds   Diverticulitis    DIVERTICULOSIS   Dysrhythmia    Elevated LFTs    Enlarged prostate    Essential hypertension    CONTROLLED ON MEDS   Full dentures    H/O hypokalemia    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 08/28/2016   Hyperlipemia    Hypomagnesemia    IFG (impaired fasting glucose)    Liver cancer (HCC) 12/08/2016   Liver mass, left lobe 08/28/2016   Probably hemangioma; will get MRI of liver, refer to GI   Lung cancer (HCC)    a. carcinoid, left lung, Stage 1b (T2a, N0, cM0);  b. 05/2013 s/p VATS & LULobectomy.   Metabolic acidosis    Monocytosis    Osteopenia    Paroxysmal atrial flutter (HCC)    a. 03/2013->no recurrence;  b. CHA2DS2VASc = 2-->not currently on anticoagulation;  c. 05/2012 Echo: EF 55-60%, normal RV.   Pneumonia    Postinflammatory pulmonary fibrosis (HCC)    Rectal bleeding    Smoking history    Wears glasses     PAST SURGICAL HISTORY: Past Surgical History:  Procedure Laterality Date   BRONCHOSCOPY     04/06/2013   CARDIOVASCULAR STRESS TEST  05/2013   a. No evidence of ischemia or infarct, EF 70%, no WMAs   CATARACT  EXTRACTION W/PHACO Left 07/14/2015   Procedure: CATARACT EXTRACTION PHACO AND INTRAOCULAR LENS PLACEMENT (IOC);  Surgeon: Lockie Mola, MD;  Location: [facility];  Service: Ophthalmology;  Laterality: Left;   CATARACT EXTRACTION W/PHACO Right 10/01/2015   Procedure: CATARACT EXTRACTION PHACO AND INTRAOCULAR LENS PLACEMENT (IOC);  Surgeon: Lockie Mola, MD;  Location: [facility];  Service: Ophthalmology;  Laterality: Right;   COLONOSCOPY W/ POLYPECTOMY     bleed after-had to go to surgery to stop  bleeding via colonoscopy   COLONOSCOPY WITH PROPOFOL N/A 11/19/2015   Procedure: COLONOSCOPY WITH PROPOFOL;  Surgeon: [name], MD;  Location: [facility] END[facility];  Service: Endoscopy;  Laterality: N/A;   DUPUYTREN CONTRACTURE RELEASE  12/15/2011   Procedure: DUPUYTREN CONTRACTURE RELEASE;  Surgeon: Nicki Reaper, MD;  Location: [facility];  Service: Orthopedics;  Laterality: Left;  Fasciotomy left ring finger dupuytrens   ELECTROMAGNETIC NAVIGATION BROCHOSCOPY Left 09/19/2019   Procedure: ELECTROMAGNETIC NAVIGATION BRONCHOSCOPY;  Surgeon: Salena Saner, MD;  Location: [facility] OR[facility];  Service: Cardiopulmonary;  Laterality: Left;   FASCIECTOMY Right 11/07/2018   Procedure: SEGMENTAL FASCIECTOMY RIGHT RING FINGER;  Surgeon: Cindee Salt, MD;  Location:  SURGERY CENTER;  Service: Orthopedics;  Laterality: Right;  AXILLARY BLOCK   FLEXIBLE BRONCHOSCOPY Left 11/26/2019   Procedure: FLEXIBLE BRONCHOSCOPY WITH CRYO THERAPY;  Surgeon: Salena Saner, MD;  Location: [facility];  Service: Cardiopulmonary;  Laterality: Left;   FLEXIBLE BRONCHOSCOPY Left 10/14/2021   Procedure: FLEXIBLE BRONCHOSCOPY;  Surgeon: Salena Saner, MD;  Location: [facility];  Service: Pulmonary;  Laterality: Left;   IR ANGIOGRAM SELECTIVE EACH ADDITIONAL VESSEL  07/26/2017   IR ANGIOGRAM SELECTIVE EACH ADDITIONAL VESSEL  07/26/2017   IR ANGIOGRAM SELECTIVE EACH ADDITIONAL VESSEL  07/26/2017   IR ANGIOGRAM SELECTIVE EACH ADDITIONAL VESSEL  07/26/2017   IR ANGIOGRAM SELECTIVE EACH ADDITIONAL VESSEL  07/26/2017   IR ANGIOGRAM SELECTIVE EACH ADDITIONAL VESSEL  08/11/2017   IR ANGIOGRAM SELECTIVE EACH ADDITIONAL VESSEL  04/28/2018   IR ANGIOGRAM SELECTIVE EACH ADDITIONAL VESSEL  12/06/2022   IR ANGIOGRAM SELECTIVE EACH ADDITIONAL VESSEL  12/06/2022   IR ANGIOGRAM SELECTIVE EACH ADDITIONAL VESSEL  12/06/2022   IR ANGIOGRAM SELECTIVE EACH ADDITIONAL VESSEL  12/06/2022   IR ANGIOGRAM SELECTIVE EACH ADDITIONAL VESSEL   12/30/2022   IR ANGIOGRAM VISCERAL SELECTIVE  07/26/2017   IR ANGIOGRAM VISCERAL SELECTIVE  08/11/2017   IR ANGIOGRAM VISCERAL SELECTIVE  04/28/2018   IR ANGIOGRAM VISCERAL SELECTIVE  12/06/2022   IR ANGIOGRAM VISCERAL SELECTIVE  12/30/2022   IR EMBO ARTERIAL NOT HEMORR HEMANG INC GUIDE ROADMAPPING  07/26/2017   IR EMBO ARTERIAL NOT HEMORR HEMANG INC GUIDE ROADMAPPING  12/06/2022   IR EMBO TUMOR ORGAN ISCHEMIA INFARCT INC GUIDE ROADMAPPING  08/11/2017   IR EMBO TUMOR ORGAN ISCHEMIA INFARCT INC GUIDE ROADMAPPING  04/28/2018   IR EMBO TUMOR ORGAN ISCHEMIA INFARCT INC GUIDE ROADMAPPING  12/30/2022   IR RADIOLOGIST EVAL & MGMT  07/07/2017   IR RADIOLOGIST EVAL & MGMT  09/07/2017   IR RADIOLOGIST EVAL & MGMT  12/21/2017   IR RADIOLOGIST EVAL & MGMT  03/22/2018   IR RADIOLOGIST EVAL & MGMT  06/08/2018   IR RADIOLOGIST EVAL & MGMT  11/01/2018   IR RADIOLOGIST EVAL & MGMT  03/21/2019   IR RADIOLOGIST EVAL & MGMT  11/08/2019   IR RADIOLOGIST EVAL & MGMT  07/01/2020   IR RADIOLOGIST EVAL & MGMT  12/18/2020   IR RADIOLOGIST EVAL & MGMT  06/18/2021   IR RADIOLOGIST  EVAL & MGMT  12/14/2021   IR RADIOLOGIST EVAL & MGMT  12/31/2021   IR US GUIDE VASC ACCESS RIGHT  07/26/2017   IR US GUIDE VASC ACCESS RIGHT  08/11/2017   IR US GUIDE VASC ACCESS RIGHT  04/28/2018   IR US GUIDE VASC ACCESS RIGHT  12/06/2022   IR US GUIDE VASC ACCESS RIGHT  12/30/2022   LUNG LOBECTOMY  06/10/13   upper left lung   MULTIPLE TOOTH EXTRACTIONS     REMOVAL RETAINED LENS Right 11/17/2015   Procedure: REMOVAL RETAINED LENS FROAGMENTS RIGHT EYE;  Surgeon: Lockie Mola, MD;  Location: [facility];  Service: Ophthalmology;  Laterality: Right;   TONSILLECTOMY     UPPER GASTROINTESTINAL ENDOSCOPY  11-03-15   Dr Lemar Livings   VASECTOMY     VIDEO ASSISTED THORACOSCOPY (VATS)/WEDGE RESECTION Left 06/04/2013   Procedure: VIDEO ASSISTED THORACOSCOPY (VATS)/WEDGE RESECTION;  Surgeon: Delight Ovens, MD;  Location: [facility] OR;  Service: Thoracic;   Laterality: Left;   VIDEO BRONCHOSCOPY N/A 06/04/2013   Procedure: VIDEO BRONCHOSCOPY;  Surgeon: Delight Ovens, MD;  Location: [facility] OR;  Service: Thoracic;  Laterality: N/A;   VIDEO BRONCHOSCOPY Left 08/12/2021   Procedure: VIDEO BRONCHOSCOPY WITHOUT FLUORO;  Surgeon: Salena Saner, MD;  Location: [facility];  Service: Cardiopulmonary;  Laterality: Left;   VIDEO BRONCHOSCOPY Left 09/09/2021   Procedure: VIDEO BRONCHOSCOPY WITHOUT FLUORO;  Surgeon: Salena Saner, MD;  Location: [facility];  Service: Cardiopulmonary;  Laterality: Left;    FAMILY HISTORY: Family History  Problem Relation Age of Onset   Stroke Mother    Alzheimer's disease Mother    Hypertension Sister    Hyperlipidemia Sister    Hypertension Brother    Hyperlipidemia Brother    Diabetes Brother     ADVANCED DIRECTIVES (Y/N):  N  HEALTH MAINTENANCE: Social History   Tobacco Use   Smoking status: Former    Current packs/day: 0.00    Average packs/day: 1 pack/day for 40.0 years (40.0 ttl pk-yrs)    Types: Cigarettes    Start date: 12/09/1958    Quit date: 12/09/1998    Years since quitting: 24.6    Passive exposure: Never   Smokeless tobacco: Never  Vaping Use   Vaping status: Never Used  Substance Use Topics   Alcohol use: No   Drug use: No     Colonoscopy:  PAP:  Bone density:  Lipid panel:  Allergies  Allergen Reactions   Tape Itching    Surgical tapes     Current Outpatient Medications  Medication Sig Dispense Refill   amLODipine (NORVASC) 5 MG tablet Take 5 mg in the morning and take another 5 mg in the afternoon as needed if BP >150/100 90 tablet 1   aspirin EC 81 MG tablet Take 81 mg by mouth daily.     b complex vitamins capsule Take 1 capsule by mouth daily.     cholecalciferol (VITAMIN D3) 25 MCG (1000 UNIT) tablet Take 1,000 Units by mouth daily.     Cinnamon 500 MG capsule Take 500 mg by mouth at bedtime.     glucose blood (ONETOUCH VERIO) test strip USE 1 STRIP TO CHECK GLUCOSE ONCE  DAILY 100 each 0   ipratropium (ATROVENT) 0.03 % nasal spray USE 2 SPRAY(S) IN EACH NOSTRIL THREE TIMES DAILY 30 mL 11   losartan (COZAAR) 100 MG tablet Take 1 tablet (100 mg total) by mouth every morning. 90 tablet 1   Multiple Vitamins-Minerals (MULTIVITAMIN WITH MINERALS) tablet  Take 1 tablet by mouth daily.     polycarbophil (FIBERCON) 625 MG tablet Take 625 mg by mouth 2 (two) times daily.     polyvinyl alcohol (LIQUIFILM TEARS) 1.4 % ophthalmic solution Place 1 drop into both eyes as needed for dry eyes.     rosuvastatin (CRESTOR) 20 MG tablet Take 1 tablet by mouth once daily 90 tablet 1   vitamin C (ASCORBIC ACID) 500 MG tablet Take 500 mg by mouth daily.     zinc sulfate 220 (50 Zn) MG capsule Take 1 capsule (220 mg total) by mouth daily. 30 capsule 0   DM-APAP-CPM (CORICIDIN HBP PO) Take 2 tablets by mouth daily as needed (allergies/cold symptoms). (Patient not taking: Reported on 06/13/2023)     ondansetron (ZOFRAN) 8 MG tablet Take 1 tablet (8 mg total) by mouth 2 (two) times daily as needed for nausea or vomiting. (Patient not taking: Reported on 08/15/2023) 20 tablet 1   No current facility-administered medications for this visit.   Facility-Administered Medications Ordered in Other Visits  Medication Dose Route Frequency Provider Last Rate Last Admin   lanreotide acetate (SOMATULINE DEPOT) injection 120 mg  120 mg Subcutaneous Once Jeralyn Ruths, MD        OBJECTIVE: Vitals:   08/15/23 1048  BP: (!) 169/85  Pulse: (!) 57  Resp: 16  Temp: 98 F (36.7 C)  SpO2: 99%     Body mass index is 26.97 kg/m.    ECOG FS:0 - Asymptomatic  General: Well-developed, well-nourished, no acute distress. Eyes: Pink conjunctiva, anicteric sclera. HEENT: Normocephalic, moist mucous membranes. Lungs: No audible wheezing or coughing. Heart: Regular rate and rhythm. Abdomen: Soft, nontender, no obvious distention. Musculoskeletal: No edema, cyanosis, or clubbing. Neuro: Alert, answering  all questions appropriately. Cranial nerves grossly intact. Skin: No rashes or petechiae noted. Psych: Normal affect.  LAB RESULTS:  Lab Results  Component Value Date   NA 135 08/15/2023   K 4.4 08/15/2023   CL 106 08/15/2023   CO2 25 08/15/2023   GLUCOSE 109 (H) 08/15/2023   BUN 16 08/15/2023   CREATININE 0.99 08/15/2023   CALCIUM 9.0 08/15/2023   PROT 6.5 08/15/2023   ALBUMIN 3.7 08/15/2023   AST 23 08/15/2023   ALT 23 08/15/2023   ALKPHOS 131 (H) 08/15/2023   BILITOT 0.7 08/15/2023   GFRNONAA >60 08/15/2023   GFRAA >60 01/29/2020    Lab Results  Component Value Date   WBC 9.4 08/15/2023   NEUTROABS 7.3 08/15/2023   HGB 15.6 08/15/2023   HCT 47.5 08/15/2023   MCV 93.1 08/15/2023   PLT 162 08/15/2023     STUDIES: No results found.  ONCOLOGY HISTORY: Patient's pathology, imaging, and outside facility notes reviewed extensively. Patient underwent left upper lobe lobectomy in [place] on June 04, 2013. Final pathology results revealed a well differentiated neuroendocrine tumor with positive bronchial margins. Patient did not receive adjuvant XRT or chemotherapy at that time.  His most recent Y 90 ablation to his liver occurred on April 28, 2018.   CT scan results from December 28, 2021  with 2 new foci noted in his liver likely consistent with progressive disease.  Patient subsequently underwent 4 treatments with Lutathera completing on August 04, 2022.  Patient also underwent Y90 embolization on December 30, 2022.    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  Patient's most recent MRI on May 02, 2023 reviewed independently with overall positive posttreatment changes and slightly smaller hepatic lesions with evidence of tumor necrosis.  Patient was noted to have a new enhancing lesion measuring 15 x 13 mm.  No  intervention is needed at this time.  Proceed with lanreotide today.  Return to clinic in 1 and 2 months for laboratory work and lanreotide only.  Patient with then return to clinic in 3 months with repeat imaging and continuation of treatment.   Left mainstem endobronchial mass: CT scan results noted.  Consistent with neuroendocrine tumor.  No plans for pulmonary intervention at this time.  Continue follow-up with them as scheduled.  I spent a total of 30 minutes reviewing chart data, face-to-face evaluation with the patient, counseling and coordination of care as detailed above.   Patient expressed understanding and was in agreement with this plan. He also understands that He can call clinic at any time with any questions, concerns, or complaints.    Cancer Staging  Neuroendocrine carcinoma of lung [facility]) Staging form: Lung, AJCC 8th Edition - Clinical stage from 12/28/2016: Stage IVA (cT2a, cN0, cM1b) - Signed by Jeralyn Ruths, MD on 12/28/2016 Sites of metastasis: Liver   Jeralyn Ruths, MD   08/15/2023 12:30 PM

## 2023-08-17 LAB — CHROMOGRANIN A: Chromogranin A (ng/mL): 1257 ng/mL — ABNORMAL HIGH (ref 0.0–101.8)

## 2023-09-01 ENCOUNTER — Encounter: Payer: Self-pay | Admitting: Pulmonary Disease

## 2023-09-01 ENCOUNTER — Ambulatory Visit: Payer: Medicare Other | Admitting: Pulmonary Disease

## 2023-09-01 VITALS — BP 110/80 | HR 56 | Temp 97.9°F | Ht 64.0 in | Wt 158.2 lb

## 2023-09-01 DIAGNOSIS — C7A8 Other malignant neuroendocrine tumors: Secondary | ICD-10-CM

## 2023-09-01 DIAGNOSIS — Z8616 Personal history of COVID-19: Secondary | ICD-10-CM

## 2023-09-01 DIAGNOSIS — J8410 Pulmonary fibrosis, unspecified: Secondary | ICD-10-CM

## 2023-09-01 DIAGNOSIS — R053 Chronic cough: Secondary | ICD-10-CM | POA: Diagnosis not present

## 2023-09-01 NOTE — Patient Instructions (Signed)
 VISIT SUMMARY:  You came in today for a follow-up on your lung carcinoid and chronic cough. Your persistent cough, especially when lying down, was discussed. You are currently receiving monthly injections for your lung carcinoid, and a recent PET scan showed no concerning findings in the area previously treated in the lung. You are scheduled for a chest MRI in July.  YOUR PLAN:  -LUNG CARCINOID: Lung carcinoid is a type of slow-growing cancer that starts in the lungs. You are currently receiving monthly injections as part of your treatment, and a recent PET scan showed no concerning findings. We will continue with the monthly injections and review the results of your upcoming MRI in July.  -CHRONIC COUGH: Chronic cough is a cough that lasts for a long time. Your cough is persistent, especially when lying down, but it tends to settle down over time. No new symptoms have been reported.  INSTRUCTIONS:  Please continue with your monthly injections as scheduled. We will review the results of your chest MRI in July. If you experience any new symptoms or changes in your condition, please contact our office.  We will see you in follow-up in 6 months time.

## 2023-09-01 NOTE — Progress Notes (Signed)
 Subjective:    Patient ID: Jason Malone, male    DOB: Nov 25, 1940, 83 y.o.   MRN: [id number]  Patient Care Team: Rockney Cid, DO as PCP - General (Internal Medicine) Devorah Fonder, MD as Consulting Physician (Cardiology) Shellie Dials, MD as Consulting Physician (Oncology)  Chief Complaint  Patient presents with   Follow-up    4 months: Chronic cough, pulmonary fibrosis, lung cx. Feno last time: 5. Chest CT: 07/23/23  Feeling much better, occasional dry cough, and has been getting good sleep. Overall no symptoms to report.    BACKGROUND/INTERVAL: Jason Malone is an 83 year old former smoker with a history of stage IVa neuroendocrine tumor of the lung metastatic to liver and bone, who presents for follow-up on the same. I last saw him on 09 May 2023.   HPI Discussed the use of AI scribe software for clinical note transcription with the patient, who gave verbal consent to proceed.  History of Present Illness   Jason Malone is an 83 year old male with lung carcinoid who presents with chronic cough.  He experiences a persistent cough, particularly noticeable when lying down, which tends to settle down over time. No wheezing is reported.  He prefers not to take any inhalers for this as he states it is "not too bothersome".  It only occurs when lying down and only for a few minutes.  He is currently receiving monthly injections as part of his ongoing treatment for lung carcinoid. A previous PET scan showed no concerning findings in the treated area of the lung, indicating good response of the recurrence over the suture line on the left upper lobe. He is scheduled for a chest MRI in July.  No bone pain, suggesting his bone metastasis are stable.  He has not had any shortness of breath.  He is sleeping well.  Overall he feels well and looks well.  He said to sleep    Review of Systems A 10 point review of systems was performed and it is as noted above otherwise negative.    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   Respiratory failure (HCC) 06/11/2019   Elevated LFTs 06/11/2019   Pneumonia due to COVID-19 virus 06/10/2019   Contracture of palmar fascia 10/11/2018   Microalbuminuria due to type 2 diabetes mellitus (HCC) 04/12/2018   Type 2 diabetes mellitus (HCC) 04/10/2018   Osteopenia determined by x-ray 01/07/2017   Neuroendocrine carcinoma of lung (HCC) 12/28/2016   Low back pain 12/17/2016   Liver mass, left lobe 08/28/2016   Aortic atherosclerosis (HCC) 04/06/2016   Coronary artery disease involving native heart without angina pectoris 04/06/2016   History of colonic polyps 10/28/2015   Medication monitoring encounter 08/15/2015   Paroxysmal atrial flutter (HCC)    Elevated serum alkaline phosphatase level 03/05/2015   Allergic rhinitis    BPH without urinary obstruction    Hyperlipidemia 02/13/2014   Orthostasis 07/18/2013   Sinus bradycardia 07/18/2013   S/P lobectomy of lung 06/04/2013   Atrial flutter (HCC) 04/09/2013   Smoking  history 04/09/2013   Essential hypertension 04/09/2013   Rectal bleeding 12/15/2012    Social History   Tobacco Use   Smoking status: Former    Current packs/day: 0.00    Average packs/day: 1 pack/day for 40.0 years (40.0 ttl pk-yrs)    Types: Cigarettes    Start date: 12/09/1958    Quit date: 12/09/1998    Years since quitting: 24.7    Passive exposure: Never   Smokeless tobacco: Never  Substance Use Topics    Alcohol use: No    Allergies  Allergen Reactions   Tape Itching    Surgical tapes     Current Meds  Medication Sig   amLODipine  (NORVASC ) 5 MG tablet Take 5 mg in the morning and take another 5 mg in the afternoon as needed if BP >150/100   aspirin  EC 81 MG tablet Take 81 mg by mouth daily.   b complex vitamins capsule Take 1 capsule by mouth daily.   cholecalciferol (VITAMIN D3) 25 MCG (1000 UNIT) tablet Take 1,000 Units by mouth daily.   Cinnamon 500 MG capsule Take 500 mg by mouth at bedtime.   glucose blood (ONETOUCH VERIO) test strip USE 1 STRIP TO CHECK GLUCOSE ONCE DAILY   ipratropium (ATROVENT ) 0.03 % nasal spray USE 2 SPRAY(S) IN EACH NOSTRIL THREE TIMES DAILY   losartan  (COZAAR ) 100 MG tablet Take 1 tablet (100 mg total) by mouth every morning.   Multiple Vitamins-Minerals (MULTIVITAMIN WITH MINERALS) tablet Take 1 tablet by mouth daily.   polycarbophil (FIBERCON) 625 MG tablet Take 625 mg by mouth 2 (two) times daily.   polyvinyl alcohol (LIQUIFILM TEARS) 1.4 % ophthalmic solution Place 1 drop into both eyes as needed for dry eyes.   rosuvastatin  (CRESTOR ) 20 MG tablet Take 1 tablet by mouth once daily   vitamin C (ASCORBIC ACID ) 500 MG tablet Take 500 mg by mouth daily.   zinc  sulfate 220 (50 Zn) MG capsule Take 1 capsule (220 mg total) by mouth daily.    Immunization History  Administered Date(s) Administered   Fluad Quad(high Dose 65+) 01/24/2019, 01/23/2020, 01/30/2021, 02/01/2022   Fluad Trivalent(High Dose 65+) 02/07/2023   Influenza, High Dose Seasonal PF 02/12/2016, 02/14/2017, 01/17/2018   Influenza,inj,Quad PF,6+ Mos 02/14/2015   PFIZER(Purple Top)SARS-COV-2 Vaccination 05/24/2019, 10/02/2019, 04/14/2020   PNEUMOCOCCAL CONJUGATE-20 12/28/2021   Pneumococcal Conjugate-13 02/01/2014   Pneumococcal Polysaccharide-23 05/10/2008        Objective:     BP 110/80 (BP Location: Right Arm, Patient Position: Sitting, Cuff Size: Normal)   Pulse (!) 56   Temp  97.9 F (36.6 C) (Oral)   Ht 5\' 4"  (1.626 m)   Wt 158 lb 3.2 oz (71.8 kg)   SpO2 95%   BMI 27.15 kg/m   SpO2: 95 %  GENERAL: Awake and alert, well developed, well nourished gentleman, in no respiratory distress, fully ambulatory, no conversational dyspnea. HEAD: Normocephalic, atraumatic. EYES: Pupils equal, round, reactive to light.  No scleral icterus. MOUTH: Wears dentures.  No thrush. NECK: Supple. No thyromegaly. No nodules. No JVD.  Trachea is midline PULMONARY:  Symmetrical and good air entry,no wheezes or rhonchi noted, no crackles. CARDIOVASCULAR: S1 and S2.  Regular rate and rhythm.No rubs murmurs or gallops appreciated. GASTROINTESTINAL: Benign. MUSCULOSKELETAL: No joint deformity, no clubbing, no edema. NEUROLOGIC: No focal deficit, no gait disturbance, speech is fluent. SKIN: Intact,warm,dry. No rashes noted on limited exam. PSYCH: Mood and behavior normal.   Assessment & Plan:     ICD-10-CM   1.  Neuroendocrine carcinoma of lung (HCC) -stage IVa  C7A.8     2. Postinflammatory pulmonary fibrosis (HCC)  J84.10     3. Chronic cough  R05.3     4. History of COVID-19  Z86.16      Discussion:    Lung carcinoid No current symptoms of wheezing or bone pain. Recent PET scan showed no concerning findings in the treated area. Monthly injections are ongoing as part of the treatment plan. - Continue monthly injections - Review results of upcoming MRI in July  Chronic cough Chronic cough persists, primarily when lying down, but resolves thereafter. No new symptoms reported.  - Patient declines use of inhalers   Advised if symptoms do not improve or worsen, to please contact office for sooner follow up or seek emergency care.    I spent 30 minutes of dedicated to the care of this patient on the date of this encounter to include pre-visit review of records, face-to-face time with the patient discussing conditions above, post visit ordering of testing, clinical  documentation with the electronic health record, making appropriate referrals as documented, and communicating necessary findings to members of the patients care team.   C. Chloe Counter, MD Advanced Bronchoscopy PCCM [facility]    *This note was generated using voice recognition software/Dragon and/or AI transcription program.  Despite best efforts to proofread, errors can occur which can change the meaning. Any transcriptional errors that result from this process are unintentional and may not be fully correctedPatient declines use of inhalers at the time of dictation.

## 2023-09-05 ENCOUNTER — Encounter: Payer: Self-pay | Admitting: Pulmonary Disease

## 2023-09-07 ENCOUNTER — Ambulatory Visit: Payer: Medicare Other | Admitting: Pulmonary Disease

## 2023-09-07 NOTE — Progress Notes (Signed)
 Cardiology Clinic Note   Date: 09/08/2023 ID: Jason Malone, DOB 16-Oct-1940, MRN [id number]  Primary Cardiologist:  Belva Boyden, MD  Chief Complaint   Jason Malone is a 83 y.o. male who presents to the clinic today for overdue routine follow up.   Patient Profile   Jason Malone is followed by Dr. Gollan for the history outlined below.      Past medical history significant for: Coronary artery calcification. Nuclear stress test 05/22/2013: No EKG changes concerning for ischemia.  Low risk study. DOE. Echo 12/11/2019: EF 55 to 60%.  No RWMA.  Mild LVH.  Grade II DD.  Normal RV size/function.  Mild to moderate LAE.  Mild MR. Paroxysmal a-flutter. Onset November 2014 in the setting of electrolyte derangement (hypokalemia, hypomagnesia). PSVT. 14-day ZIO 12/07/2019: HR 40 to 164 bpm, average 63 bpm.  Predominantly sinus rhythm.  27 runs of SVT fastest 10 beats max rate 164 bpm, longest 15 beats average 98 bpm.  Occasional PACs 3.8%.  Occasional PVCs 1.3%. Hypertension. Hyperlipidemia. Lipid panel 06/20/2023: LDL 48, HDL 40, TG 136, total 110. COPD. Lung cancer. S/p left lobectomy. T2DM.  In summary, patient establish care with Dr. Gollan in November 2014 during hospital admission soon after diagnosis of lung cancer and found to be in a-flutter.  He he was found to low potassium and magnesium thought to be from use of hydrochlorothiazide which was ultimately discontinued.  He was treated with rate controlling medications and converted to NSR.  He underwent left lobectomy in 2014.  He had COVID-pneumonia in March 2021.  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  He had an ablation May and June 2021 and was pending cryoablation with Dr. Viva Grise in July 2021.  At his routine follow-up visit in June 2021 TEE complained of DOE, lightheadedness, weakness in his legs.  Dyspnea was  occurring with heavier exertion but not routine walking but seem to be getting worse.  He also noted progressive dyspnea with or minimal activities like standing in the shower.  He also complained of orthostasis with recent hypotension at home with BP readings as low as 79/46.  Losartan  was decreased.  He was educated on proper hydration.  14-day ZIO demonstrated runs of SVT as detailed above.  Echo showed normal LV/RV function as detailed above.  Upon follow-up in August 2021 he reported improvement of hypotension and orthostasis with the reduction of losartan .  No other changes were made.  Patient was last seen in the office by Dr. Gollan on 04/12/2022 for routine follow-up.  He was doing well at that time and maintaining sinus rhythm.  Carvedilol  continued to be held for orthostasis and bradycardia.  He continued to be followed by oncology and was receiving immunotherapy for history of liver cancer.     History of Present Illness    Today, patient is doing well. Patient denies shortness of breath, lower extremity edema, orthopnea or PND. He reports dyspnea with heavier exertion but not routine activities. He states after weed eating for 20-25 minutes he gets a little winded and will rest for 1-2 minutes before returning to activity.  No chest pain, pressure, or tightness. No palpitations. He is active doing yard work and other chores around his home and walks on a treadmill for 15 minutes daily. He continues to receive immunotherapy for liver cancer.     ROS: All other systems  reviewed and are otherwise negative except as noted in History of Present Illness.  EKGs/Labs Reviewed    EKG Interpretation Date/Time:  Thursday Sep 08 2023 10:56:12 EDT Ventricular Rate:  56 PR Interval:  182 QRS Duration:  78 QT Interval:  372 QTC Calculation: 358 R Axis:   12  Text Interpretation: Sinus bradycardia Nonspecific T wave abnormality When compared with ECG of 24-Jun-2021 11:24, Nonspecific T wave  abnormality now evident in Lateral leads Confirmed by Morey Ar [phone]) on 09/08/2023 11:12:56 AM   08/15/2023: ALT 23; AST 23; BUN 16; Creatinine 0.99; Potassium 4.4; Sodium 135   08/15/2023: Hemoglobin 15.6; WBC 9.4    Physical Exam    VS:  BP 110/60   Pulse (!) 56   Ht 5\' 4"  (1.626 m)   Wt 158 lb (71.7 kg)   SpO2 96%   BMI 27.12 kg/m  , BMI Body mass index is 27.12 kg/m.  GEN: Well nourished, well developed, in no acute distress. Neck: No JVD or carotid bruits. Cardiac:  RRR. No murmurs. No rubs or gallops.   Respiratory:  Respirations regular and unlabored. Clear to auscultation without rales, wheezing or rhonchi. GI: Soft, nontender, nondistended. Extremities: Radials/DP/PT 2+ and equal bilaterally. No clubbing or cyanosis. No edema.  Skin: Warm and dry, no rash. Neuro: Strength intact.  Assessment & Plan   Coronary artery calcification Nuclear stress test January 2015 was low risk study with no evidence of ischemia.  Patient denies chest pain, pressure or tightness. He is active performing  yard work and other household activities and walks on a treadmill for 15 minutes daily.  - Continue aspirin , rosuvastatin .  DOE Echo August 2021 showed normal LV/RV function, mild LVH, Grade II DD, mild to moderate LAE, mild MR.  Patient reports dyspnea with heavier exertion such as weed eating for 20-25 minutes causing him to rest for 1-2 minutes before returning to activity. He does no experience dyspnea with routine activities.  - No further testing indicated at this time.   Paroxysmal a-flutter/PSVT Onset November 2014 in the setting of electrolyte derangement.  14-day ZIO July 2021 showed HR 40 to 164 bpm, average 63 bpm, predominantly sinus rhythm, 27 runs of SVT fastest 10 beats max rate 164 bpm, longest 15 beats average rate 98 bpm, occasional PACs/PVCs.  Patient denies palpitations. EKG demonstrates sinus bradycardia 56 bpm.  - Continue to monitor.  Hypertension BP today  110/66. No report of headaches or dizziness.  Continue amlodipine , losartan .  Hyperlipidemia LDL 48 February 2025, at goal. - Continue rosuvastatin .  Disposition: Return in 1 year or sooner as needed.          Signed, Lonell Rives. Earley Grobe, DNP, NP-C

## 2023-09-08 ENCOUNTER — Ambulatory Visit: Attending: Student | Admitting: Student

## 2023-09-08 ENCOUNTER — Encounter: Payer: Self-pay | Admitting: Student

## 2023-09-08 VITALS — BP 110/60 | HR 56 | Ht 64.0 in | Wt 158.0 lb

## 2023-09-08 DIAGNOSIS — I471 Supraventricular tachycardia, unspecified: Secondary | ICD-10-CM

## 2023-09-08 DIAGNOSIS — I2510 Atherosclerotic heart disease of native coronary artery without angina pectoris: Secondary | ICD-10-CM

## 2023-09-08 DIAGNOSIS — I10 Essential (primary) hypertension: Secondary | ICD-10-CM

## 2023-09-08 DIAGNOSIS — E785 Hyperlipidemia, unspecified: Secondary | ICD-10-CM | POA: Diagnosis not present

## 2023-09-08 DIAGNOSIS — R0609 Other forms of dyspnea: Secondary | ICD-10-CM

## 2023-09-08 DIAGNOSIS — I4892 Unspecified atrial flutter: Secondary | ICD-10-CM

## 2023-09-08 NOTE — Patient Instructions (Signed)
 Medication Instructions:  Your Physician recommend you continue on your current medication as directed.    *If you need a refill on your cardiac medications before your next appointment, please call your pharmacy*  Follow-Up: At [facility], you and your health needs are our priority.  As part of our continuing mission to provide you with exceptional heart care, our providers are all part of one team.  This team includes your primary Cardiologist (physician) and Advanced Practice Providers or APPs (Physician Assistants and Nurse Practitioners) who all work together to provide you with the care you need, when you need it.  Your next appointment:   12 month(s)  Provider:   You may see Timothy Gollan, MD or one of the following Advanced Practice Providers on your designated Care Team:   Laneta Pintos, NP Gildardo Labrador, PA-C Varney Gentleman, PA-C Cadence [place], PA-C Ronald Cockayne, NP Morey Ar, NP    We recommend signing up for the patient portal called "MyChart".  Sign up information is provided on this After Visit Summary.  MyChart is used to connect with patients for Virtual Visits (Telemedicine).  Patients are able to view lab/test results, encounter notes, upcoming appointments, etc.  Non-urgent messages can be sent to your provider as well.   To learn more about what you can do with MyChart, go to [email or web address].

## 2023-09-13 ENCOUNTER — Other Ambulatory Visit: Payer: Self-pay | Admitting: *Deleted

## 2023-09-13 DIAGNOSIS — C7A8 Other malignant neuroendocrine tumors: Secondary | ICD-10-CM

## 2023-09-14 ENCOUNTER — Inpatient Hospital Stay

## 2023-09-14 ENCOUNTER — Inpatient Hospital Stay: Attending: Oncology

## 2023-09-14 DIAGNOSIS — C7B02 Secondary carcinoid tumors of liver: Secondary | ICD-10-CM | POA: Diagnosis not present

## 2023-09-14 DIAGNOSIS — C7A090 Malignant carcinoid tumor of the bronchus and lung: Secondary | ICD-10-CM | POA: Insufficient documentation

## 2023-09-14 DIAGNOSIS — C7A8 Other malignant neuroendocrine tumors: Secondary | ICD-10-CM

## 2023-09-14 DIAGNOSIS — Z79899 Other long term (current) drug therapy: Secondary | ICD-10-CM | POA: Diagnosis not present

## 2023-09-14 LAB — CMP (CANCER CENTER ONLY)
ALT: 32 U/L (ref 0–44)
AST: 41 U/L (ref 15–41)
Albumin: 4 g/dL (ref 3.5–5.0)
Alkaline Phosphatase: 129 U/L — ABNORMAL HIGH (ref 38–126)
Anion gap: 11 (ref 5–15)
BUN: 20 mg/dL (ref 8–23)
CO2: 24 mmol/L (ref 22–32)
Calcium: 9.4 mg/dL (ref 8.9–10.3)
Chloride: 106 mmol/L (ref 98–111)
Creatinine: 1.02 mg/dL (ref 0.61–1.24)
GFR, Estimated: >60 mL/min (ref >60)
Glucose, Bld: 106 mg/dL — ABNORMAL HIGH (ref 70–99)
Potassium: 4.7 mmol/L (ref 3.5–5.1)
Sodium: 141 mmol/L (ref 135–145)
Total Bilirubin: 0.7 mg/dL (ref 0.0–1.2)
Total Protein: 6.8 g/dL (ref 6.5–8.1)

## 2023-09-14 LAB — CBC WITH DIFFERENTIAL/PLATELET
Abs Immature Granulocytes: 0.03 10*3/uL (ref 0.00–0.07)
Basophils Absolute: 0.1 10*3/uL (ref 0.0–0.1)
Basophils Relative: 1 %
Eosinophils Absolute: 0.1 10*3/uL (ref 0.0–0.5)
Eosinophils Relative: 1 %
HCT: 47.4 % (ref 39.0–52.0)
Hemoglobin: 15.5 g/dL (ref 13.0–17.0)
Immature Granulocytes: 0 %
Lymphocytes Relative: 7 %
Lymphs Abs: 0.6 10*3/uL — ABNORMAL LOW (ref 0.7–4.0)
MCH: 30.2 pg (ref 26.0–34.0)
MCHC: 32.7 g/dL (ref 30.0–36.0)
MCV: 92.4 fL (ref 80.0–100.0)
Monocytes Absolute: 1.3 10*3/uL — ABNORMAL HIGH (ref 0.1–1.0)
Monocytes Relative: 15 %
Neutro Abs: 6.5 10*3/uL (ref 1.7–7.7)
Neutrophils Relative %: 76 %
Platelets: 153 10*3/uL (ref 150–400)
RBC: 5.13 MIL/uL (ref 4.22–5.81)
RDW: 14.4 % (ref 11.5–15.5)
WBC: 8.6 10*3/uL (ref 4.0–10.5)
nRBC: 0 % (ref 0.0–0.2)

## 2023-09-14 MED ORDER — LANREOTIDE ACETATE 120 MG/0.5ML ~~LOC~~ SOLN
120.0000 mg | Freq: Once | SUBCUTANEOUS | Status: AC
  Administered 2023-09-14: 120 mg via SUBCUTANEOUS
  Filled 2023-09-14: qty 120

## 2023-09-16 LAB — CHROMOGRANIN A: Chromogranin A (ng/mL): 1369 ng/mL — ABNORMAL HIGH (ref 0.0–101.8)

## 2023-09-19 ENCOUNTER — Other Ambulatory Visit: Payer: Self-pay | Admitting: Internal Medicine

## 2023-09-19 DIAGNOSIS — E119 Type 2 diabetes mellitus without complications: Secondary | ICD-10-CM

## 2023-09-20 NOTE — Telephone Encounter (Signed)
 Requested Prescriptions  Pending Prescriptions Disp Refills   glucose blood (ONETOUCH VERIO) test strip [Pharmacy Med Name: ONETOUCH VERIO TES] 100 each 2    Sig: USE 1 STRIP TO CHECK GLUCOSE ONCE DAILY     Endocrinology: Diabetes - Testing Supplies Passed - 09/20/2023  4:27 PM      Passed - Valid encounter within last 12 months    Recent Outpatient Visits           3 months ago Essential hypertension   [facility] Rockney Cid, DO       Future Appointments             In 2 months Rockney Cid, DO [facility]

## 2023-10-14 ENCOUNTER — Other Ambulatory Visit: Payer: Self-pay | Admitting: *Deleted

## 2023-10-14 DIAGNOSIS — C7A8 Other malignant neuroendocrine tumors: Secondary | ICD-10-CM

## 2023-10-17 ENCOUNTER — Inpatient Hospital Stay: Attending: Oncology

## 2023-10-17 ENCOUNTER — Inpatient Hospital Stay

## 2023-10-17 DIAGNOSIS — C7A8 Other malignant neuroendocrine tumors: Secondary | ICD-10-CM

## 2023-10-17 DIAGNOSIS — C7B02 Secondary carcinoid tumors of liver: Secondary | ICD-10-CM | POA: Diagnosis not present

## 2023-10-17 DIAGNOSIS — Z79899 Other long term (current) drug therapy: Secondary | ICD-10-CM | POA: Insufficient documentation

## 2023-10-17 DIAGNOSIS — C7A090 Malignant carcinoid tumor of the bronchus and lung: Secondary | ICD-10-CM | POA: Insufficient documentation

## 2023-10-17 LAB — CBC WITH DIFFERENTIAL/PLATELET
Abs Immature Granulocytes: 0.04 10*3/uL (ref 0.00–0.07)
Basophils Absolute: 0.1 10*3/uL (ref 0.0–0.1)
Basophils Relative: 1 %
Eosinophils Absolute: 0.1 10*3/uL (ref 0.0–0.5)
Eosinophils Relative: 1 %
HCT: 47.4 % (ref 39.0–52.0)
Hemoglobin: 15.6 g/dL (ref 13.0–17.0)
Immature Granulocytes: 0 %
Lymphocytes Relative: 7 %
Lymphs Abs: 0.7 10*3/uL (ref 0.7–4.0)
MCH: 30.8 pg (ref 26.0–34.0)
MCHC: 32.9 g/dL (ref 30.0–36.0)
MCV: 93.7 fL (ref 80.0–100.0)
Monocytes Absolute: 1.3 10*3/uL — ABNORMAL HIGH (ref 0.1–1.0)
Monocytes Relative: 13 %
Neutro Abs: 7.8 10*3/uL — ABNORMAL HIGH (ref 1.7–7.7)
Neutrophils Relative %: 78 %
Platelets: 147 10*3/uL — ABNORMAL LOW (ref 150–400)
RBC: 5.06 MIL/uL (ref 4.22–5.81)
RDW: 14.4 % (ref 11.5–15.5)
WBC: 10.1 10*3/uL (ref 4.0–10.5)
nRBC: 0 % (ref 0.0–0.2)

## 2023-10-17 LAB — CMP (CANCER CENTER ONLY)
ALT: 30 U/L (ref 0–44)
AST: 33 U/L (ref 15–41)
Albumin: 3.9 g/dL (ref 3.5–5.0)
Alkaline Phosphatase: 129 U/L — ABNORMAL HIGH (ref 38–126)
Anion gap: 9 (ref 5–15)
BUN: 21 mg/dL (ref 8–23)
CO2: 22 mmol/L (ref 22–32)
Calcium: 8.8 mg/dL — ABNORMAL LOW (ref 8.9–10.3)
Chloride: 107 mmol/L (ref 98–111)
Creatinine: 1.03 mg/dL (ref 0.61–1.24)
GFR, Estimated: >60 mL/min (ref >60)
Glucose, Bld: 174 mg/dL — ABNORMAL HIGH (ref 70–99)
Potassium: 4.1 mmol/L (ref 3.5–5.1)
Sodium: 138 mmol/L (ref 135–145)
Total Bilirubin: 0.9 mg/dL (ref 0.0–1.2)
Total Protein: 6.9 g/dL (ref 6.5–8.1)

## 2023-10-17 MED ORDER — LANREOTIDE ACETATE 120 MG/0.5ML ~~LOC~~ SOLN
120.0000 mg | Freq: Once | SUBCUTANEOUS | Status: AC
  Administered 2023-10-17: 120 mg via SUBCUTANEOUS
  Filled 2023-10-17: qty 120

## 2023-10-19 LAB — CHROMOGRANIN A: Chromogranin A (ng/mL): 1724 ng/mL — ABNORMAL HIGH (ref 0.0–101.8)

## 2023-11-14 ENCOUNTER — Ambulatory Visit: Admission: RE | Admit: 2023-11-14 | Source: Ambulatory Visit

## 2023-11-17 ENCOUNTER — Ambulatory Visit
Admission: RE | Admit: 2023-11-17 | Discharge: 2023-11-17 | Disposition: A | Discharge status: Home / Self Care | Source: Ambulatory Visit | Attending: Oncology | Admitting: Oncology

## 2023-11-17 DIAGNOSIS — C7A1 Malignant poorly differentiated neuroendocrine tumors: Secondary | ICD-10-CM | POA: Diagnosis not present

## 2023-11-17 DIAGNOSIS — C7A8 Other malignant neuroendocrine tumors: Secondary | ICD-10-CM | POA: Diagnosis not present

## 2023-11-17 DIAGNOSIS — K769 Liver disease, unspecified: Secondary | ICD-10-CM | POA: Diagnosis not present

## 2023-11-17 DIAGNOSIS — Z9049 Acquired absence of other specified parts of digestive tract: Secondary | ICD-10-CM | POA: Diagnosis not present

## 2023-11-17 DIAGNOSIS — C7951 Secondary malignant neoplasm of bone: Secondary | ICD-10-CM | POA: Diagnosis not present

## 2023-11-17 MED ORDER — GADOBUTROL 1 MMOL/ML IV SOLN
7.0000 mL | Freq: Once | INTRAVENOUS | Status: AC | PRN
  Administered 2023-11-17: 7 mL via INTRAVENOUS

## 2023-11-18 ENCOUNTER — Other Ambulatory Visit: Payer: Self-pay | Admitting: *Deleted

## 2023-11-18 DIAGNOSIS — C7A8 Other malignant neuroendocrine tumors: Secondary | ICD-10-CM

## 2023-11-21 ENCOUNTER — Inpatient Hospital Stay

## 2023-11-21 ENCOUNTER — Encounter: Payer: Self-pay | Admitting: Oncology

## 2023-11-21 ENCOUNTER — Inpatient Hospital Stay: Attending: Oncology

## 2023-11-21 ENCOUNTER — Inpatient Hospital Stay (HOSPITAL_BASED_OUTPATIENT_CLINIC_OR_DEPARTMENT_OTHER): Admitting: Oncology

## 2023-11-21 VITALS — BP 190/80 | HR 65 | Temp 98.0°F | Resp 16 | Ht 64.0 in | Wt 157.0 lb

## 2023-11-21 DIAGNOSIS — C7A8 Other malignant neuroendocrine tumors: Secondary | ICD-10-CM | POA: Diagnosis not present

## 2023-11-21 DIAGNOSIS — C7B02 Secondary carcinoid tumors of liver: Secondary | ICD-10-CM | POA: Diagnosis not present

## 2023-11-21 DIAGNOSIS — C7A090 Malignant carcinoid tumor of the bronchus and lung: Secondary | ICD-10-CM | POA: Diagnosis not present

## 2023-11-21 LAB — CBC WITH DIFFERENTIAL/PLATELET
Abs Immature Granulocytes: 0.09 K/uL — ABNORMAL HIGH (ref 0.00–0.07)
Basophils Absolute: 0.1 K/uL (ref 0.0–0.1)
Basophils Relative: 1 %
Eosinophils Absolute: 0.1 K/uL (ref 0.0–0.5)
Eosinophils Relative: 0 %
HCT: 47.3 % (ref 39.0–52.0)
Hemoglobin: 15.7 g/dL (ref 13.0–17.0)
Immature Granulocytes: 1 %
Lymphocytes Relative: 4 %
Lymphs Abs: 0.6 K/uL — ABNORMAL LOW (ref 0.7–4.0)
MCH: 30.9 pg (ref 26.0–34.0)
MCHC: 33.2 g/dL (ref 30.0–36.0)
MCV: 93.1 fL (ref 80.0–100.0)
Monocytes Absolute: 1.7 K/uL — ABNORMAL HIGH (ref 0.1–1.0)
Monocytes Relative: 12 %
Neutro Abs: 12.6 K/uL — ABNORMAL HIGH (ref 1.7–7.7)
Neutrophils Relative %: 82 %
Platelets: 138 K/uL — ABNORMAL LOW (ref 150–400)
RBC: 5.08 MIL/uL (ref 4.22–5.81)
RDW: 13.9 % (ref 11.5–15.5)
Smear Review: NORMAL
WBC: 15.2 K/uL — ABNORMAL HIGH (ref 4.0–10.5)
nRBC: 0 % (ref 0.0–0.2)

## 2023-11-21 LAB — CMP (CANCER CENTER ONLY)
ALT: 30 U/L (ref 0–44)
AST: 59 U/L — ABNORMAL HIGH (ref 15–41)
Albumin: 3.9 g/dL (ref 3.5–5.0)
Alkaline Phosphatase: 144 U/L — ABNORMAL HIGH (ref 38–126)
Anion gap: 5 (ref 5–15)
BUN: 18 mg/dL (ref 8–23)
CO2: 24 mmol/L (ref 22–32)
Calcium: 9.1 mg/dL (ref 8.9–10.3)
Chloride: 108 mmol/L (ref 98–111)
Creatinine: 0.95 mg/dL (ref 0.61–1.24)
GFR, Estimated: >60 mL/min (ref >60)
Glucose, Bld: 225 mg/dL — ABNORMAL HIGH (ref 70–99)
Potassium: 4.1 mmol/L (ref 3.5–5.1)
Sodium: 137 mmol/L (ref 135–145)
Total Bilirubin: 1 mg/dL (ref 0.0–1.2)
Total Protein: 6.9 g/dL (ref 6.5–8.1)

## 2023-11-21 MED ORDER — LANREOTIDE ACETATE 120 MG/0.5ML ~~LOC~~ SOLN
120.0000 mg | Freq: Once | SUBCUTANEOUS | Status: AC
  Administered 2023-11-21: 120 mg via SUBCUTANEOUS
  Filled 2023-11-21: qty 120

## 2023-11-21 NOTE — Progress Notes (Unsigned)
 Patient had his MRI on 11/17/2023, and since then he has been having some severe arm pain where they put in the iv for the MRI, and the back of his neck has been bothering him.

## 2023-11-21 NOTE — Progress Notes (Unsigned)
 [facility]  [phone]) [phone] [phone]) [phone]  ID: Jason [facility] OB: [birth year]  MR#: [id number]  RDW#:[id number]  Patient Care Team: [name], DO as PCP - General (Internal Medicine) [name], MD as PCP - Cardiology (Cardiology) [name], MD as Consulting Physician (Cardiology) [name], MD as Consulting Physician (Oncology)   CHIEF COMPLAINT: 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  INTERVAL HISTORY: Patient returns to clinic today for further evaluation, discussion of his MRI results, and continuation of lanreotide.  He continues to feel well and remains asymptomatic.  He does not complain of any weakness or fatigue.  He has no neurologic complaints.  He denies any recent fevers or illnesses.  He has a good appetite and denies weight loss.  He denies any chest pain, shortness of breath, or hemoptysis.  He denies any abdominal pain.  He has no nausea, vomiting, constipation, or diarrhea.  He has no urinary complaints.  Patient offers no specific complaints today.  REVIEW OF SYSTEMS:   Review of Systems  Constitutional: Negative.  Negative for fever, malaise/fatigue and weight loss.  Respiratory: Negative.  Negative for cough, hemoptysis and shortness of breath.   Cardiovascular: Negative.  Negative for chest pain and leg swelling.  Gastrointestinal: Negative.  Negative for abdominal pain, diarrhea, nausea and vomiting.  Genitourinary: Negative.  Negative for dysuria.  Musculoskeletal: Negative.  Negative for back pain.  Skin: Negative.  Negative for rash.  Neurological: Negative.  Negative for dizziness, sensory change, focal weakness and weakness.  Psychiatric/Behavioral: Negative.  The patient is not nervous/anxious.     As per HPI. Otherwise, a complete review of systems is negative.  PAST MEDICAL HISTORY: Past Medical History:  Diagnosis Date   Allergic rhinitis    Aortic  atherosclerosis (HCC)    Benign prostatic hypertrophy with lower urinary tract symptoms (LUTS)    Bradycardia    Chest pain    a. 05/2013 MV: EF 70%, no ischemia.   Chronic respiratory failure with hypoxia (HCC)    COPD (chronic obstructive pulmonary disease) (HCC)    Coronary artery disease    COVID-19    Diabetes mellitus without complication (HCC)    last a1c 6.5 09-16-21 no meds   Diverticulitis    DIVERTICULOSIS   Dysrhythmia    Elevated LFTs    Enlarged prostate    Essential hypertension    CONTROLLED ON MEDS   Full dentures    H/O hypokalemia    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 08/28/2016   Hyperlipemia    Hypomagnesemia    IFG (impaired fasting glucose)    Liver cancer (HCC) 12/08/2016   Liver mass, left lobe 08/28/2016   Probably hemangioma; will get MRI of liver, refer to GI   Lung cancer (HCC)    a. carcinoid, left lung, Stage 1b (T2a, N0, cM0);  b. 05/2013 s/p VATS & LULobectomy.   Metabolic acidosis    Monocytosis    Osteopenia    Paroxysmal atrial flutter (HCC)    a. 03/2013->no recurrence;  b. CHA2DS2VASc = 2-->not currently on anticoagulation;  c. 05/2012 Echo: EF 55-60%, normal RV.   Pneumonia    Postinflammatory pulmonary fibrosis (HCC)    Rectal bleeding    Smoking history    Wears glasses     PAST SURGICAL HISTORY: Past Surgical History:  Procedure Laterality Date   BRONCHOSCOPY     04/06/2013   CARDIOVASCULAR STRESS TEST  05/2013  a. No evidence of ischemia or infarct, EF 70%, no WMAs   CATARACT EXTRACTION W/PHACO Left 07/14/2015   Procedure: CATARACT EXTRACTION PHACO AND INTRAOCULAR LENS PLACEMENT (IOC);  Surgeon: Dene Etienne, MD;  Location: [facility];  Service: Ophthalmology;  Laterality: Left;   CATARACT EXTRACTION W/PHACO Right 10/01/2015   Procedure: CATARACT EXTRACTION PHACO AND INTRAOCULAR LENS PLACEMENT (IOC);  Surgeon: Dene Etienne, MD;  Location: [facility];  Service: Ophthalmology;   Laterality: Right;   COLONOSCOPY W/ POLYPECTOMY     bleed after-had to go to surgery to stop bleeding via colonoscopy   COLONOSCOPY WITH PROPOFOL  N/A 11/19/2015   Procedure: COLONOSCOPY WITH PROPOFOL ;  Surgeon: Reyes LELON Cota, MD;  Location: [facility];  Service: Endoscopy;  Laterality: N/A;   DUPUYTREN CONTRACTURE RELEASE  12/15/2011   Procedure: DUPUYTREN CONTRACTURE RELEASE;  Surgeon: Arley JONELLE Curia, MD;  Location: [facility];  Service: Orthopedics;  Laterality: Left;  Fasciotomy left ring finger dupuytrens   ELECTROMAGNETIC NAVIGATION BROCHOSCOPY Left 09/19/2019   Procedure: ELECTROMAGNETIC NAVIGATION BRONCHOSCOPY;  Surgeon: Tamea Dedra CROME, MD;  Location: [facility];  Service: Cardiopulmonary;  Laterality: Left;   FASCIECTOMY Right 11/07/2018   Procedure: SEGMENTAL FASCIECTOMY RIGHT RING FINGER;  Surgeon: Curia Arley, MD;  [facility];  Service: Orthopedics;  Laterality: Right;  AXILLARY BLOCK   FLEXIBLE BRONCHOSCOPY Left 11/26/2019   Procedure: FLEXIBLE BRONCHOSCOPY WITH CRYO THERAPY;  Surgeon: Tamea Dedra CROME, MD;  Location: [facility];  Service: Cardiopulmonary;  Laterality: Left;   FLEXIBLE BRONCHOSCOPY Left 10/14/2021   Procedure: FLEXIBLE BRONCHOSCOPY;  Surgeon: Tamea Dedra CROME, MD;  Location: [facility];  Service: Pulmonary;  Laterality: Left;   IR ANGIOGRAM SELECTIVE EACH ADDITIONAL VESSEL  07/26/2017   IR ANGIOGRAM SELECTIVE EACH ADDITIONAL VESSEL  07/26/2017   IR ANGIOGRAM SELECTIVE EACH ADDITIONAL VESSEL  07/26/2017   IR ANGIOGRAM SELECTIVE EACH ADDITIONAL VESSEL  07/26/2017   IR ANGIOGRAM SELECTIVE EACH ADDITIONAL VESSEL  07/26/2017   IR ANGIOGRAM SELECTIVE EACH ADDITIONAL VESSEL  08/11/2017   IR ANGIOGRAM SELECTIVE EACH ADDITIONAL VESSEL  04/28/2018   IR ANGIOGRAM SELECTIVE EACH ADDITIONAL VESSEL  12/06/2022   IR ANGIOGRAM SELECTIVE EACH ADDITIONAL VESSEL  12/06/2022   IR ANGIOGRAM SELECTIVE EACH ADDITIONAL VESSEL  12/06/2022   IR ANGIOGRAM  SELECTIVE EACH ADDITIONAL VESSEL  12/06/2022   IR ANGIOGRAM SELECTIVE EACH ADDITIONAL VESSEL  12/30/2022   IR ANGIOGRAM VISCERAL SELECTIVE  07/26/2017   IR ANGIOGRAM VISCERAL SELECTIVE  08/11/2017   IR ANGIOGRAM VISCERAL SELECTIVE  04/28/2018   IR ANGIOGRAM VISCERAL SELECTIVE  12/06/2022   IR ANGIOGRAM VISCERAL SELECTIVE  12/30/2022   IR EMBO ARTERIAL NOT HEMORR HEMANG INC GUIDE ROADMAPPING  07/26/2017   IR EMBO ARTERIAL NOT HEMORR HEMANG INC GUIDE ROADMAPPING  12/06/2022   IR EMBO TUMOR ORGAN ISCHEMIA INFARCT INC GUIDE ROADMAPPING  08/11/2017   IR EMBO TUMOR ORGAN ISCHEMIA INFARCT INC GUIDE ROADMAPPING  04/28/2018   IR EMBO TUMOR ORGAN ISCHEMIA INFARCT INC GUIDE ROADMAPPING  12/30/2022   IR RADIOLOGIST EVAL & MGMT  07/07/2017   IR RADIOLOGIST EVAL & MGMT  09/07/2017   IR RADIOLOGIST EVAL & MGMT  12/21/2017   IR RADIOLOGIST EVAL & MGMT  03/22/2018   IR RADIOLOGIST EVAL & MGMT  06/08/2018   IR RADIOLOGIST EVAL & MGMT  11/01/2018   IR RADIOLOGIST EVAL & MGMT  03/21/2019   IR RADIOLOGIST EVAL & MGMT  11/08/2019   IR RADIOLOGIST EVAL & MGMT  07/01/2020   IR RADIOLOGIST EVAL & MGMT  12/18/2020   IR RADIOLOGIST EVAL & MGMT  06/18/2021   IR RADIOLOGIST EVAL & MGMT  12/14/2021   IR RADIOLOGIST EVAL & MGMT  12/31/2021   IR US  GUIDE VASC ACCESS RIGHT  07/26/2017   IR US  GUIDE VASC ACCESS RIGHT  08/11/2017   IR US  GUIDE VASC ACCESS RIGHT  04/28/2018   IR US  GUIDE VASC ACCESS RIGHT  12/06/2022   IR US  GUIDE VASC ACCESS RIGHT  12/30/2022   LUNG LOBECTOMY  06/10/13   upper left lung   MULTIPLE TOOTH EXTRACTIONS     REMOVAL RETAINED LENS Right 11/17/2015   Procedure: REMOVAL RETAINED LENS FROAGMENTS RIGHT EYE;  Surgeon: Dene Etienne, MD;  Location: [facility];  Service: Ophthalmology;  Laterality: Right;   TONSILLECTOMY     UPPER GASTROINTESTINAL ENDOSCOPY  11-03-15   Dr Dessa   VASECTOMY     VIDEO ASSISTED THORACOSCOPY (VATS)/WEDGE RESECTION Left 06/04/2013   Procedure: VIDEO ASSISTED THORACOSCOPY  (VATS)/WEDGE RESECTION;  Surgeon: [place] KATHEE Jude, MD;  Location: [facility] OR;  Service: Thoracic;  Laterality: Left;   VIDEO BRONCHOSCOPY N/A 06/04/2013   Procedure: VIDEO BRONCHOSCOPY;  Surgeon: [place] KATHEE Jude, MD;  Location: [facility];  Service: Thoracic;  Laterality: N/A;   VIDEO BRONCHOSCOPY Left 08/12/2021   Procedure: VIDEO BRONCHOSCOPY WITHOUT FLUORO;  Surgeon: Tamea Dedra CROME, MD;  Location: [facility];  Service: Cardiopulmonary;  Laterality: Left;   VIDEO BRONCHOSCOPY Left 09/09/2021   Procedure: VIDEO BRONCHOSCOPY WITHOUT FLUORO;  Surgeon: Tamea Dedra CROME, MD;  Location: [facility];  Service: Cardiopulmonary;  Laterality: Left;    FAMILY HISTORY: Family History  Problem Relation Age of Onset   Stroke Mother    Alzheimer's disease Mother    Hypertension Sister    Hyperlipidemia Sister    Hypertension Brother    Hyperlipidemia Brother    Diabetes Brother     ADVANCED DIRECTIVES (Y/N):  N  HEALTH MAINTENANCE: Social History   Tobacco Use   Smoking status: Former    Current packs/day: 0.00    Average packs/day: 1 pack/day for 40.0 years (40.0 ttl pk-yrs)    Types: Cigarettes    Start date: 12/09/1958    Quit date: 12/09/1998    Years since quitting: 24.9    Passive exposure: Never   Smokeless tobacco: Never  Vaping Use   Vaping status: Never Used  Substance Use Topics   Alcohol use: No   Drug use: No     Colonoscopy:  PAP:  Bone density:  Lipid panel:  Allergies  Allergen Reactions   Tape Itching    Surgical tapes     Current Outpatient Medications  Medication Sig Dispense Refill   amLODipine  (NORVASC ) 5 MG tablet Take 5 mg in the morning and take another 5 mg in the afternoon as needed if BP >150/100 90 tablet 1   aspirin  EC 81 MG tablet Take 81 mg by mouth daily.     b complex vitamins capsule Take 1 capsule by mouth daily.     cholecalciferol (VITAMIN D3) 25 MCG (1000 UNIT) tablet Take 1,000 Units by mouth daily.     Cinnamon 500 MG capsule Take 500 mg by  mouth at bedtime.     DM-APAP-CPM (CORICIDIN HBP PO) Take 2 tablets by mouth daily as needed (allergies/cold symptoms).     glucose blood (ONETOUCH VERIO) test strip USE 1 STRIP TO CHECK GLUCOSE ONCE DAILY 100 each 2   ipratropium (ATROVENT ) 0.03 % nasal spray USE 2 SPRAY(S) IN EACH NOSTRIL THREE TIMES  DAILY 30 mL 11   losartan  (COZAAR ) 100 MG tablet Take 1 tablet (100 mg total) by mouth every morning. 90 tablet 1   Multiple Vitamins-Minerals (MULTIVITAMIN WITH MINERALS) tablet Take 1 tablet by mouth daily.     ondansetron  (ZOFRAN ) 8 MG tablet Take 1 tablet (8 mg total) by mouth 2 (two) times daily as needed for nausea or vomiting. 20 tablet 1   polycarbophil (FIBERCON) 625 MG tablet Take 625 mg by mouth 2 (two) times daily.     polyvinyl alcohol (LIQUIFILM TEARS) 1.4 % ophthalmic solution Place 1 drop into both eyes as needed for dry eyes.     rosuvastatin  (CRESTOR ) 20 MG tablet Take 1 tablet by mouth once daily 90 tablet 1   vitamin C (ASCORBIC ACID ) 500 MG tablet Take 500 mg by mouth daily.     zinc  sulfate 220 (50 Zn) MG capsule Take 1 capsule (220 mg total) by mouth daily. 30 capsule 0   No current facility-administered medications for this visit.   Facility-Administered Medications Ordered in Other Visits  Medication Dose Route Frequency Provider Last Rate Last Admin   lanreotide acetate  (SOMATULINE DEPOT ) injection 120 mg  120 mg Subcutaneous Once Billye Pickerel J, MD       lanreotide acetate  (SOMATULINE DEPOT ) injection 120 mg  120 mg Subcutaneous Once Kayana Thoen J, MD        OBJECTIVE: Vitals:   11/21/23 1039 11/21/23 1046  BP: (!) 192/98 (!) 190/80  Pulse: 65   Resp: 16   Temp: 98 F (36.7 C)   SpO2: 98%      Body mass index is 26.95 kg/m.    ECOG FS:0 - Asymptomatic  General: Well-developed, well-nourished, no acute distress. Eyes: Pink conjunctiva, anicteric sclera. HEENT: Normocephalic, moist mucous membranes. Lungs: No audible wheezing or coughing. Heart:  Regular rate and rhythm. Abdomen: Soft, nontender, no obvious distention. Musculoskeletal: No edema, cyanosis, or clubbing. Neuro: Alert, answering all questions appropriately. Cranial nerves grossly intact. Skin: No rashes or petechiae noted. Psych: Normal affect.  LAB RESULTS:  Lab Results  Component Value Date   NA PENDING 11/21/2023   K 4.1 11/21/2023   CL PENDING 11/21/2023   CO2 PENDING 11/21/2023   GLUCOSE 225 (H) 11/21/2023   BUN 18 11/21/2023   CREATININE 0.95 11/21/2023   CALCIUM  9.1 11/21/2023   PROT 6.9 11/21/2023   ALBUMIN  3.9 11/21/2023   AST 59 (H) 11/21/2023   ALT 30 11/21/2023   ALKPHOS 144 (H) 11/21/2023   BILITOT 1.0 11/21/2023   GFRNONAA >60 11/21/2023   GFRAA >60 01/29/2020    Lab Results  Component Value Date   WBC 15.2 (H) 11/21/2023   NEUTROABS 12.6 (H) 11/21/2023   HGB 15.7 11/21/2023   HCT 47.3 11/21/2023   MCV 93.1 11/21/2023   PLT 138 (L) 11/21/2023     STUDIES: MR Abdomen W Wo Contrast Result Date: 11/18/2023 CLINICAL DATA:  History of neuroendocrine tumor, follow-up. EXAM: MRI ABDOMEN WITHOUT AND WITH CONTRAST TECHNIQUE: Multiplanar multisequence MR imaging of the abdomen was performed both before and after the administration of intravenous contrast. CONTRAST:  7mL GADAVIST  GADOBUTROL  1 MMOL/ML IV SOLN COMPARISON:  Multiple priors including MRI May 02, 2023 FINDINGS: Lower chest: Heterogeneous signal in the lung bases likely reflects atelectasis. Hepatobiliary: Bilobar hepatic metastatic lesions, some of which demonstrate decreased enhancement in comparison to prior. For reference: -decreased enhancement of the 3 cm lesion in the hepatic dome on image 12/14 previously measuring 2.9 cm. -similar enhancement in the adjacent  2.2 cm lesion in the hepatic dome on image 13/14 previously measuring 2.7 cm -decreased enhancement in the lateral right lobe of the liver lesion measuring 2.9 cm on image 24/14 previously measuring 3.4 cm. Additional  lesions are decreased in considerably decreased in conspicuity for instance in the central right lobe of the liver measuring 8 mm on image 26/14 previously 11 mm and the lesions in segment III on image 29/14. Partially exophytic lesion in the lateral left lobe of the liver measuring 16 mm on image 31/14 previously measured 16 mm. No new suspicious hepatic lesion. Gallbladder surgically absent. Similar prominence of the biliary tree favored reservoir effect post cholecystectomy. Pancreas: No pancreatic ductal dilation or evidence of acute inflammation. Spleen:  No splenomegaly. Adrenals/Urinary Tract: No suspicious adrenal nodule/mass. No hydronephrosis. No solid enhancing renal mass. Stomach/Bowel: Visualized portions within the abdomen are unremarkable. Vascular/Lymphatic: No pathologically enlarged lymph nodes identified. No abdominal aortic aneurysm demonstrated. Other:  None. Musculoskeletal: Similar appearance of the heterogeneously enhancing osseous metastasis in the thoracic and lumbar spine as well as the soft tissue metastasis adjacent to the right side of the L4 vertebral body measuring 17 mm on image 69/14. IMPRESSION: 1. Bilobar hepatic metastatic lesions, some of which demonstrate decreased enhancement, others are significantly decreased in conspicuity and a few appear overall similar in comparison to prior. No new suspicious hepatic lesion. 2. Similar appearance of the heterogeneously enhancing osseous metastasis in the thoracic and lumbar spine as well as the soft tissue metastasis adjacent to the right side of the L4 vertebral body. Electronically Signed   By: Reyes Holder M.D.   On: 11/18/2023 15:01    ONCOLOGY HISTORY: Patient's pathology, imaging, and outside facility notes reviewed extensively. Patient underwent left upper lobe lobectomy in [place] on June 04, 2013. Final pathology results revealed a well differentiated neuroendocrine tumor with positive bronchial margins. Patient did  not receive adjuvant XRT or chemotherapy at that time.  His most recent Y 90 ablation to his liver occurred on April 28, 2018.   CT scan results from December 28, 2021  with 2 new foci noted in his liver likely consistent with progressive disease.  Patient subsequently underwent 4 treatments with Lutathera  completing on August 04, 2022.  Patient also underwent Y90 embolization on December 30, 2022.    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  Patient's most recent MRI on November 17, 2023 reviewed independently with overall improvement of disease burden.  No new suspicious hepatic lesions were reported.  No intervention is needed at this time.  Proceed with lanreotide today.  Return to clinic in 1 and 2 months for laboratory work and lanreotide only.  Patient will then return to clinic in 3 months  for further evaluation and continuation of treatment.  Repeat imaging in January 2026.   Left mainstem endobronchial mass: Consistent with neuroendocrine tumor.  No plans for pulmonary intervention at this time.  Continue follow-up with them as scheduled.  I spent a total of 30 minutes reviewing chart data, face-to-face evaluation with the patient, counseling and coordination of care as detailed above.    Patient expressed understanding and was in agreement with this plan. He also understands that He can call clinic at any time with any questions, concerns, or complaints.    Cancer Staging  Neuroendocrine carcinoma of lung (HCC) Staging form: Lung, AJCC  8th Edition - Clinical stage from 12/28/2016: Stage IVA (cT2a, cN0, cM1b) - Signed by [name], MD on 12/28/2016 Sites of metastasis: Liver   Evalene PARAS Jacobo, MD   11/21/2023 11:13 AM

## 2023-11-22 ENCOUNTER — Encounter: Payer: Self-pay | Admitting: Oncology

## 2023-11-23 LAB — CHROMOGRANIN A: Chromogranin A (ng/mL): 1237 ng/mL — ABNORMAL HIGH (ref 0.0–101.8)

## 2023-12-05 DIAGNOSIS — Z872 Personal history of diseases of the skin and subcutaneous tissue: Secondary | ICD-10-CM | POA: Diagnosis not present

## 2023-12-05 DIAGNOSIS — L570 Actinic keratosis: Secondary | ICD-10-CM | POA: Diagnosis not present

## 2023-12-05 DIAGNOSIS — L578 Other skin changes due to chronic exposure to nonionizing radiation: Secondary | ICD-10-CM | POA: Diagnosis not present

## 2023-12-05 DIAGNOSIS — Z859 Personal history of malignant neoplasm, unspecified: Secondary | ICD-10-CM | POA: Diagnosis not present

## 2023-12-12 ENCOUNTER — Encounter: Payer: Self-pay | Admitting: Internal Medicine

## 2023-12-12 ENCOUNTER — Ambulatory Visit: Payer: Medicare Other | Admitting: Internal Medicine

## 2023-12-12 VITALS — BP 136/78 | HR 60 | Temp 97.8°F | Resp 16 | Ht 64.0 in | Wt 154.0 lb

## 2023-12-12 DIAGNOSIS — E119 Type 2 diabetes mellitus without complications: Secondary | ICD-10-CM

## 2023-12-12 DIAGNOSIS — I10 Essential (primary) hypertension: Secondary | ICD-10-CM

## 2023-12-12 DIAGNOSIS — E782 Mixed hyperlipidemia: Secondary | ICD-10-CM

## 2023-12-12 LAB — POCT GLYCOSYLATED HEMOGLOBIN (HGB A1C): Hemoglobin A1C: 7.4 % — AB (ref 4.0–5.6)

## 2023-12-12 MED ORDER — LOSARTAN POTASSIUM 100 MG PO TABS: 100.0000 mg | ORAL_TABLET | ORAL | 1 refills | Status: DC

## 2023-12-12 MED ORDER — AMLODIPINE BESYLATE 5 MG PO TABS: ORAL_TABLET | ORAL | 1 refills | Status: DC

## 2023-12-12 MED ORDER — ROSUVASTATIN CALCIUM 20 MG PO TABS: 20.0000 mg | ORAL_TABLET | Freq: Every day | ORAL | 1 refills | Status: DC

## 2023-12-12 NOTE — Progress Notes (Signed)
 Established Patient Office Visit  Subjective   Patient ID: Jason Malone, male    DOB: 1941-02-07  Age: 83 y.o. MRN: [id number]  Chief Complaint  Patient presents with   Medical Management of Chronic Issues    6 month recheck    HPI  Jason Malone presents for follow up on chronic medical conditions.  Discussed the use of AI scribe software for clinical note transcription with the patient, who gave verbal consent to proceed.  History of Present Illness Jason Malone is an 83 year old male with diabetes who presents for a routine follow-up visit.  His A1c level is stable at 7.4, attributed to a consistent diet. He does not frequently monitor blood sugar levels due to a large supply of test strips and no perceived need for regular checks. Blood pressure is 136/78. He experienced inflammation in his arm after an IV placement during an MRI, which resolved over time.   Hypertension/A.Flutter: -Medications: Losartan  100 mg, now taking Amlodipine  5 mg after his injection and a few days afterwards  -Patient is compliant with above medications and reports no side effects. -Denies any SOB, CP, vision changes or symptoms of hypotension.  HLD: -Medications: Crestor  20 mg -Patient is compliant with above medications and reports no side effects.  -Last lipid panel: Lipid Panel     Component Value Date/Time   CHOL 110 06/20/2023 1140   CHOL 133 02/14/2017 1046   TRIG 136 06/20/2023 1140   HDL 40 06/20/2023 1140   HDL 39 (L) 02/14/2017 1046   CHOLHDL 2.8 06/20/2023 1140   VLDL 9 06/15/2019 0114   LDLCALC 48 06/20/2023 1140   LABVLDL 24 02/14/2017 1046    Diabetes, Type 2: -Last A1c 7.4% 2/25 -Medications: Nothing currently - started on Metformin  previously but could not tolerate -Eye exam: Due, will call to schedule -Foot exam: UTD 2/25 -Microalbumin: UTD 2/25 -Statin: yes -PNA vaccine: yes -Denies symptoms of hypoglycemia, polyuria, polydipsia, numbness extremities, foot  ulcers/trauma.   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 -Currently on Lanreotide   Health Maintenance: -Blood work UTD -Discussed RSV vaccine, patient declines   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   Respiratory failure (HCC) 06/11/2019   Elevated LFTs 06/11/2019   Pneumonia due to COVID-19 virus 06/10/2019   Contracture of palmar fascia 10/11/2018   Microalbuminuria due to type 2 diabetes mellitus (HCC) 04/12/2018   Type 2 diabetes mellitus (HCC) 04/10/2018   Osteopenia determined by x-ray 01/07/2017   Neuroendocrine carcinoma of lung (HCC) 12/28/2016   Low back pain 12/17/2016   Liver mass, left lobe 08/28/2016   Aortic atherosclerosis (HCC) 04/06/2016   Coronary artery disease involving native heart without angina pectoris 04/06/2016   History of colonic polyps 10/28/2015   Medication monitoring encounter 08/15/2015   Paroxysmal atrial flutter (HCC)    Elevated serum alkaline  phosphatase level 03/05/2015   Allergic rhinitis    BPH without urinary obstruction    Hyperlipidemia 02/13/2014   Orthostasis 07/18/2013   Sinus bradycardia 07/18/2013   S/P lobectomy of lung 06/04/2013   Atrial flutter (HCC) 04/09/2013   Smoking history 04/09/2013    Essential hypertension 04/09/2013   Rectal bleeding 12/15/2012   Past Medical History:  Diagnosis Date   Allergic rhinitis    Aortic atherosclerosis (HCC)    Benign prostatic hypertrophy with lower urinary tract symptoms (LUTS)    Bradycardia    Chest pain    a. 05/2013 MV: EF 70%, no ischemia.   Chronic respiratory failure with hypoxia (HCC)    COPD (chronic obstructive pulmonary disease) (HCC)    Coronary artery disease    COVID-19    Diabetes mellitus without complication (HCC)    last a1c 6.5 09-16-21 no meds   Diverticulitis    DIVERTICULOSIS   Dysrhythmia    Elevated LFTs    Enlarged prostate    Essential hypertension    CONTROLLED ON MEDS   Full dentures    H/O hypokalemia    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 08/28/2016   Hyperlipemia    Hypomagnesemia    IFG (impaired fasting glucose)    Liver cancer (HCC) 12/08/2016   Liver mass, left lobe 08/28/2016   Probably hemangioma; will get MRI of liver, refer to GI   Lung cancer (HCC)    a. carcinoid, left lung, Stage 1b (T2a, N0, cM0);  b. 05/2013 s/p VATS & LULobectomy.   Metabolic acidosis    Monocytosis    Osteopenia    Paroxysmal atrial flutter (HCC)    a. 03/2013->no recurrence;  b. CHA2DS2VASc = 2-->not currently on anticoagulation;  c. 05/2012 Echo: EF 55-60%, normal RV.   Pneumonia    Postinflammatory pulmonary fibrosis (HCC)    Rectal bleeding    Smoking history    Wears glasses    Past Surgical History:  Procedure Laterality Date   BRONCHOSCOPY     04/06/2013   CARDIOVASCULAR STRESS TEST  05/2013   a. No evidence of ischemia or infarct, EF 70%, no WMAs   CATARACT EXTRACTION W/PHACO Left 07/14/2015   Procedure: CATARACT EXTRACTION PHACO AND INTRAOCULAR LENS PLACEMENT (IOC);  Surgeon: Dene Etienne, MD;  Location: [facility];  Service: Ophthalmology;  Laterality: Left;   CATARACT EXTRACTION W/PHACO Right 10/01/2015   Procedure: CATARACT EXTRACTION PHACO AND INTRAOCULAR LENS  PLACEMENT (IOC);  Surgeon: Dene Etienne, MD;  Location: [facility];  Service: Ophthalmology;  Laterality: Right;   COLONOSCOPY W/ POLYPECTOMY     bleed after-had to go to surgery to stop bleeding via colonoscopy   COLONOSCOPY WITH PROPOFOL  N/A 11/19/2015   Procedure: COLONOSCOPY WITH PROPOFOL ;  Surgeon: Reyes LELON Cota, MD;  Location: [facility];  Service: Endoscopy;  Laterality: N/A;   DUPUYTREN CONTRACTURE RELEASE  12/15/2011   Procedure: DUPUYTREN CONTRACTURE RELEASE;  Surgeon: Arley JONELLE Curia, MD;  Location: [facility];  Service: Orthopedics;  Laterality: Left;  Fasciotomy left ring finger dupuytrens   ELECTROMAGNETIC NAVIGATION BROCHOSCOPY Left 09/19/2019   Procedure: ELECTROMAGNETIC NAVIGATION BRONCHOSCOPY;  Surgeon: Tamea Dedra CROME, MD;  Location: [facility];  Service: Cardiopulmonary;  Laterality: Left;   FASCIECTOMY Right 11/07/2018   Procedure: SEGMENTAL FASCIECTOMY RIGHT RING FINGER;  Surgeon: Curia Arley, MD;  Location: [facility];  Service: Orthopedics;  Laterality: Right;  AXILLARY BLOCK   FLEXIBLE BRONCHOSCOPY Left 11/26/2019   Procedure: FLEXIBLE BRONCHOSCOPY WITH  CRYO THERAPY;  Surgeon: Tamea Dedra CROME, MD;  Location: [facility];  Service: Cardiopulmonary;  Laterality: Left;   FLEXIBLE BRONCHOSCOPY Left 10/14/2021   Procedure: FLEXIBLE BRONCHOSCOPY;  Surgeon: Tamea Dedra CROME, MD;  Location: [facility];  Service: Pulmonary;  Laterality: Left;   IR ANGIOGRAM SELECTIVE EACH ADDITIONAL VESSEL  07/26/2017   IR ANGIOGRAM SELECTIVE EACH ADDITIONAL VESSEL  07/26/2017   IR ANGIOGRAM SELECTIVE EACH ADDITIONAL VESSEL  07/26/2017   IR ANGIOGRAM SELECTIVE EACH ADDITIONAL VESSEL  07/26/2017   IR ANGIOGRAM SELECTIVE EACH ADDITIONAL VESSEL  07/26/2017   IR ANGIOGRAM SELECTIVE EACH ADDITIONAL VESSEL  08/11/2017   IR ANGIOGRAM SELECTIVE EACH ADDITIONAL VESSEL  04/28/2018   IR ANGIOGRAM SELECTIVE EACH ADDITIONAL VESSEL  12/06/2022   IR ANGIOGRAM SELECTIVE EACH  ADDITIONAL VESSEL  12/06/2022   IR ANGIOGRAM SELECTIVE EACH ADDITIONAL VESSEL  12/06/2022   IR ANGIOGRAM SELECTIVE EACH ADDITIONAL VESSEL  12/06/2022   IR ANGIOGRAM SELECTIVE EACH ADDITIONAL VESSEL  12/30/2022   IR ANGIOGRAM VISCERAL SELECTIVE  07/26/2017   IR ANGIOGRAM VISCERAL SELECTIVE  08/11/2017   IR ANGIOGRAM VISCERAL SELECTIVE  04/28/2018   IR ANGIOGRAM VISCERAL SELECTIVE  12/06/2022   IR ANGIOGRAM VISCERAL SELECTIVE  12/30/2022   IR EMBO ARTERIAL NOT HEMORR HEMANG INC GUIDE ROADMAPPING  07/26/2017   IR EMBO ARTERIAL NOT HEMORR HEMANG INC GUIDE ROADMAPPING  12/06/2022   IR EMBO TUMOR ORGAN ISCHEMIA INFARCT INC GUIDE ROADMAPPING  08/11/2017   IR EMBO TUMOR ORGAN ISCHEMIA INFARCT INC GUIDE ROADMAPPING  04/28/2018   IR EMBO TUMOR ORGAN ISCHEMIA INFARCT INC GUIDE ROADMAPPING  12/30/2022   IR RADIOLOGIST EVAL & MGMT  07/07/2017   IR RADIOLOGIST EVAL & MGMT  09/07/2017   IR RADIOLOGIST EVAL & MGMT  12/21/2017   IR RADIOLOGIST EVAL & MGMT  03/22/2018   IR RADIOLOGIST EVAL & MGMT  06/08/2018   IR RADIOLOGIST EVAL & MGMT  11/01/2018   IR RADIOLOGIST EVAL & MGMT  03/21/2019   IR RADIOLOGIST EVAL & MGMT  11/08/2019   IR RADIOLOGIST EVAL & MGMT  07/01/2020   IR RADIOLOGIST EVAL & MGMT  12/18/2020   IR RADIOLOGIST EVAL & MGMT  06/18/2021   IR RADIOLOGIST EVAL & MGMT  12/14/2021   IR RADIOLOGIST EVAL & MGMT  12/31/2021   IR US  GUIDE VASC ACCESS RIGHT  07/26/2017   IR US  GUIDE VASC ACCESS RIGHT  08/11/2017   IR US  GUIDE VASC ACCESS RIGHT  04/28/2018   IR US  GUIDE VASC ACCESS RIGHT  12/06/2022   IR US  GUIDE VASC ACCESS RIGHT  12/30/2022   LUNG LOBECTOMY  06/10/13   upper left lung   MULTIPLE TOOTH EXTRACTIONS     REMOVAL RETAINED LENS Right 11/17/2015   Procedure: REMOVAL RETAINED LENS FROAGMENTS RIGHT EYE;  Surgeon: Dene Etienne, MD;  Location: [facility];  Service: Ophthalmology;  Laterality: Right;   TONSILLECTOMY     UPPER GASTROINTESTINAL ENDOSCOPY  11-03-15   Dr Dessa   VASECTOMY     VIDEO ASSISTED  THORACOSCOPY (VATS)/WEDGE RESECTION Left 06/04/2013   Procedure: VIDEO ASSISTED THORACOSCOPY (VATS)/WEDGE RESECTION;  Surgeon: Dallas KATHEE Jude, MD;  Location: [facility];  Service: Thoracic;  Laterality: Left;   VIDEO BRONCHOSCOPY N/A 06/04/2013   Procedure: VIDEO BRONCHOSCOPY;  Surgeon: Dallas KATHEE Jude, MD;  Location: [facility] OR;  Service: Thoracic;  Laterality: N/A;   VIDEO BRONCHOSCOPY Left 08/12/2021   Procedure: VIDEO BRONCHOSCOPY WITHOUT FLUORO;  Surgeon: Tamea Dedra CROME, MD;  Location: [facility];  Service: Cardiopulmonary;  Laterality: Left;  VIDEO BRONCHOSCOPY Left 09/09/2021   Procedure: VIDEO BRONCHOSCOPY WITHOUT FLUORO;  Surgeon: Tamea Dedra CROME, MD;  Location: [facility];  Service: Cardiopulmonary;  Laterality: Left;   Social History   Tobacco Use   Smoking status: Former    Current packs/day: 0.00    Average packs/day: 1 pack/day for 40.0 years (40.0 ttl pk-yrs)    Types: Cigarettes    Start date: 12/09/1958    Quit date: 12/09/1998    Years since quitting: 25.0    Passive exposure: Never   Smokeless tobacco: Never  Vaping Use   Vaping status: Never Used  Substance Use Topics   Alcohol use: No   Drug use: No   Social History   Socioeconomic History   Marital status: Widowed    Spouse name: Orlean   Number of children: 3   Years of education: Not on file   Highest education level: 12th grade  Occupational History   Not on file  Tobacco Use   Smoking status: Former    Current packs/day: 0.00    Average packs/day: 1 pack/day for 40.0 years (40.0 ttl pk-yrs)    Types: Cigarettes    Start date: 12/09/1958    Quit date: 12/09/1998    Years since quitting: 25.0    Passive exposure: Never   Smokeless tobacco: Never  Vaping Use   Vaping status: Never Used  Substance and Sexual Activity   Alcohol use: No   Drug use: No   Sexual activity: Yes    Partners: Female  Other Topics Concern   Not on file  Social History Narrative   Not on file   Social Drivers of Health    Financial Resource Strain: Low Risk  (06/08/2023)   Overall Financial Resource Strain (CARDIA)    Difficulty of Paying Living Expenses: Not hard at all  Food Insecurity: No Food Insecurity (06/08/2023)   Hunger Vital Sign    Worried About Running Out of Food in the Last Year: Never true    Ran Out of Food in the Last Year: Never true  Transportation Needs: No Transportation Needs (06/08/2023)   PRAPARE - Administrator, Civil Service (Medical): No    Lack of Transportation (Non-Medical): No  Physical Activity: Insufficiently Active (06/08/2023)   Exercise Vital Sign    Days of Exercise per Week: 6 days    Minutes of Exercise per Session: 20 min  Stress: No Stress Concern Present (06/08/2023)   [employer] of Occupational Health - Occupational Stress Questionnaire    Feeling of Stress : Not at all  Social Connections: Moderately Isolated (06/08/2023)   Social Connection and Isolation Panel    Frequency of Communication with Friends and Family: More than three times a week    Frequency of Social Gatherings with Friends and Family: More than three times a week    Attends Religious Services: More than 4 times per year    Active Member of [employer] or Organizations: No    Attends Banker Meetings: Not on file    Marital Status: Widowed  Intimate Partner Violence: Not At Risk (06/12/2018)   Humiliation, Afraid, Rape, and Kick questionnaire    Fear of Current or Ex-Partner: No    Emotionally Abused: No    Physically Abused: No    Sexually Abused: No   Family Status  Relation Name Status   Mother  Deceased   Sister  Alive   Brother half Alive       diabetes  Father  Deceased       killed in WWII   Brother half Alive   Sister  Deceased       cancer   Son  Alive   Son  Alive   Son  Deceased  No partnership data on file   Family History  Problem Relation Age of Onset   Stroke Mother    Alzheimer's disease Mother    Hypertension Sister     Hyperlipidemia Sister    Hypertension Brother    Hyperlipidemia Brother    Diabetes Brother    Allergies  Allergen Reactions   Tape Itching    Surgical tapes       Review of Systems  All other systems reviewed and are negative.     Objective:     BP 136/78 (Cuff Size: Large)   Pulse 60   Temp 97.8 F (36.6 C) (Oral)   Resp 16   Ht 5' 4 (1.626 m)   Wt 154 lb (69.9 kg)   SpO2 97%   BMI 26.43 kg/m  BP Readings from Last 3 Encounters:  12/12/23 136/78  11/21/23 (!) 190/80  09/08/23 110/60   Wt Readings from Last 3 Encounters:  12/12/23 154 lb (69.9 kg)  11/21/23 157 lb (71.2 kg)  09/08/23 158 lb (71.7 kg)      Physical Exam Constitutional:      Appearance: Normal appearance.  HENT:     Head: Normocephalic and atraumatic.     Mouth/Throat:     Mouth: Mucous membranes are moist.     Pharynx: Oropharynx is clear.  Eyes:     Extraocular Movements: Extraocular movements intact.     Conjunctiva/sclera: Conjunctivae normal.     Pupils: Pupils are equal, round, and reactive to light.  Neck:     Comments: No thyromegaly Cardiovascular:     Rate and Rhythm: Normal rate and regular rhythm.  Pulmonary:     Effort: Pulmonary effort is normal.     Breath sounds: Normal breath sounds.  Musculoskeletal:     Cervical back: No tenderness.     Right lower leg: No edema.     Left lower leg: No edema.  Lymphadenopathy:     Cervical: No cervical adenopathy.  Skin:    General: Skin is warm and dry.  Neurological:     General: No focal deficit present.     Mental Status: He is alert. Mental status is at baseline.  Psychiatric:        Mood and Affect: Mood normal.        Behavior: Behavior normal.      No results found for any visits on 12/12/23.  Last CBC Lab Results  Component Value Date   WBC 15.2 (H) 11/21/2023   HGB 15.7 11/21/2023   HCT 47.3 11/21/2023   MCV 93.1 11/21/2023   MCH 30.9 11/21/2023   RDW 13.9 11/21/2023   PLT 138 (L) 11/21/2023    Last metabolic panel Lab Results  Component Value Date   GLUCOSE 225 (H) 11/21/2023   NA 137 11/21/2023   K 4.1 11/21/2023   CL 108 11/21/2023   CO2 24 11/21/2023   BUN 18 11/21/2023   CREATININE 0.95 11/21/2023   GFRNONAA >60 11/21/2023   CALCIUM  9.1 11/21/2023   PHOS 3.2 06/17/2019   PROT 6.9 11/21/2023   ALBUMIN  3.9 11/21/2023   LABGLOB 2.2 12/17/2016   AGRATIO 2.0 12/17/2016   BILITOT 1.0 11/21/2023   ALKPHOS 144 (H) 11/21/2023   AST  59 (H) 11/21/2023   ALT 30 11/21/2023   ANIONGAP 5 11/21/2023   Last lipids Lab Results  Component Value Date   CHOL 110 06/20/2023   HDL 40 06/20/2023   LDLCALC 48 06/20/2023   TRIG 136 06/20/2023   CHOLHDL 2.8 06/20/2023   Last hemoglobin A1c Lab Results  Component Value Date   HGBA1C 7.4 (H) 06/20/2023   Last thyroid functions Lab Results  Component Value Date   TSH 4.221 06/08/2013   Last vitamin D  No results found for: 25OHVITD2, 25OHVITD3, VD25OH Last vitamin B12 and Folate No results found for: VITAMINB12, FOLATE    The ASCVD Risk score (Arnett DK, et al., 2019) failed to calculate for the following reasons:   The 2019 ASCVD risk score is only valid for ages 75 to 65    Assessment & Plan:   Assessment & Plan Type 2 diabetes mellitus with hyperglycemia Type 2 diabetes is well-managed with A1c at 7.4. Home blood sugar monitoring not required unless symptomatic. - Continue current diabetes management regimen. - Hold off on regular blood sugar monitoring unless symptoms suggestive of hyperglycemia or hypoglycemia occur.  Essential hypertension Blood pressure controlled at 136/78 mmHg. Current management effective. - Continue current antihypertensive regimen. - Provide refills for blood pressure medications.  Hyperlipidemia Hyperlipidemia management ongoing. Current treatment assumed effective. - Continue current lipid-lowering therapy. - Provide refills for cholesterol medications.  - POCT HgB  A1C - rosuvastatin  (CRESTOR ) 20 MG tablet; Take 1 tablet (20 mg total) by mouth daily.  Dispense: 90 tablet; Refill: 1 - losartan  (COZAAR ) 100 MG tablet; Take 1 tablet (100 mg total) by mouth every morning.  Dispense: 90 tablet; Refill: 1 - amLODipine  (NORVASC ) 5 MG tablet; Take 5 mg in the morning and take another 5 mg in the afternoon as needed if BP >150/100  Dispense: 90 tablet; Refill: 1   Return in about 6 months (around 06/13/2024).    Sharyle Fischer, DO

## 2023-12-19 ENCOUNTER — Inpatient Hospital Stay

## 2023-12-19 ENCOUNTER — Inpatient Hospital Stay: Attending: Oncology

## 2023-12-19 DIAGNOSIS — C7B02 Secondary carcinoid tumors of liver: Secondary | ICD-10-CM | POA: Insufficient documentation

## 2023-12-19 DIAGNOSIS — C7A090 Malignant carcinoid tumor of the bronchus and lung: Secondary | ICD-10-CM | POA: Diagnosis not present

## 2023-12-19 DIAGNOSIS — C7A8 Other malignant neuroendocrine tumors: Secondary | ICD-10-CM

## 2023-12-19 DIAGNOSIS — Z79899 Other long term (current) drug therapy: Secondary | ICD-10-CM | POA: Diagnosis not present

## 2023-12-19 LAB — CBC WITH DIFFERENTIAL/PLATELET
Abs Immature Granulocytes: 0.04 K/uL (ref 0.00–0.07)
Basophils Absolute: 0.1 K/uL (ref 0.0–0.1)
Basophils Relative: 1 %
Eosinophils Absolute: 0.2 K/uL (ref 0.0–0.5)
Eosinophils Relative: 2 %
HCT: 46.7 % (ref 39.0–52.0)
Hemoglobin: 15.4 g/dL (ref 13.0–17.0)
Immature Granulocytes: 0 %
Lymphocytes Relative: 7 %
Lymphs Abs: 0.6 K/uL — ABNORMAL LOW (ref 0.7–4.0)
MCH: 30.2 pg (ref 26.0–34.0)
MCHC: 33 g/dL (ref 30.0–36.0)
MCV: 91.6 fL (ref 80.0–100.0)
Monocytes Absolute: 1.4 K/uL — ABNORMAL HIGH (ref 0.1–1.0)
Monocytes Relative: 15 %
Neutro Abs: 7.1 K/uL (ref 1.7–7.7)
Neutrophils Relative %: 75 %
Platelets: 144 K/uL — ABNORMAL LOW (ref 150–400)
RBC: 5.1 MIL/uL (ref 4.22–5.81)
RDW: 14.3 % (ref 11.5–15.5)
WBC: 9.5 K/uL (ref 4.0–10.5)
nRBC: 0 % (ref 0.0–0.2)

## 2023-12-19 LAB — CMP (CANCER CENTER ONLY)
ALT: 24 U/L (ref 0–44)
AST: 26 U/L (ref 15–41)
Albumin: 3.7 g/dL (ref 3.5–5.0)
Alkaline Phosphatase: 167 U/L — ABNORMAL HIGH (ref 38–126)
Anion gap: 9 (ref 5–15)
BUN: 17 mg/dL (ref 8–23)
CO2: 22 mmol/L (ref 22–32)
Calcium: 9.2 mg/dL (ref 8.9–10.3)
Chloride: 106 mmol/L (ref 98–111)
Creatinine: 0.89 mg/dL (ref 0.61–1.24)
GFR, Estimated: >60 mL/min (ref >60)
Glucose, Bld: 165 mg/dL — ABNORMAL HIGH (ref 70–99)
Potassium: 3.9 mmol/L (ref 3.5–5.1)
Sodium: 137 mmol/L (ref 135–145)
Total Bilirubin: 0.7 mg/dL (ref 0.0–1.2)
Total Protein: 6.7 g/dL (ref 6.5–8.1)

## 2023-12-19 MED ORDER — LANREOTIDE ACETATE 120 MG/0.5ML ~~LOC~~ SOLN
120.0000 mg | Freq: Once | SUBCUTANEOUS | Status: AC
  Administered 2023-12-19 (×2): 120 mg via SUBCUTANEOUS
  Filled 2023-12-19: qty 120

## 2023-12-21 LAB — CHROMOGRANIN A: Chromogranin A (ng/mL): 1305 ng/mL — ABNORMAL HIGH (ref 0.0–101.8)

## 2023-12-22 ENCOUNTER — Ambulatory Visit

## 2024-01-16 ENCOUNTER — Inpatient Hospital Stay: Attending: Oncology

## 2024-01-16 ENCOUNTER — Inpatient Hospital Stay

## 2024-01-16 DIAGNOSIS — C7B02 Secondary carcinoid tumors of liver: Secondary | ICD-10-CM | POA: Diagnosis not present

## 2024-01-16 DIAGNOSIS — Z79899 Other long term (current) drug therapy: Secondary | ICD-10-CM | POA: Insufficient documentation

## 2024-01-16 DIAGNOSIS — C7A8 Other malignant neuroendocrine tumors: Secondary | ICD-10-CM

## 2024-01-16 DIAGNOSIS — C7A090 Malignant carcinoid tumor of the bronchus and lung: Secondary | ICD-10-CM | POA: Diagnosis not present

## 2024-01-16 LAB — CBC WITH DIFFERENTIAL/PLATELET
Abs Immature Granulocytes: 0.05 K/uL (ref 0.00–0.07)
Basophils Absolute: 0.1 K/uL (ref 0.0–0.1)
Basophils Relative: 1 %
Eosinophils Absolute: 0.1 K/uL (ref 0.0–0.5)
Eosinophils Relative: 1 %
HCT: 49.3 % (ref 39.0–52.0)
Hemoglobin: 16.1 g/dL (ref 13.0–17.0)
Immature Granulocytes: 1 %
Lymphocytes Relative: 7 %
Lymphs Abs: 0.7 K/uL (ref 0.7–4.0)
MCH: 30.1 pg (ref 26.0–34.0)
MCHC: 32.7 g/dL (ref 30.0–36.0)
MCV: 92.3 fL (ref 80.0–100.0)
Monocytes Absolute: 1.4 K/uL — ABNORMAL HIGH (ref 0.1–1.0)
Monocytes Relative: 13 %
Neutro Abs: 8.2 K/uL — ABNORMAL HIGH (ref 1.7–7.7)
Neutrophils Relative %: 77 %
Platelets: 151 K/uL (ref 150–400)
RBC: 5.34 MIL/uL (ref 4.22–5.81)
RDW: 14.6 % (ref 11.5–15.5)
WBC: 10.5 K/uL (ref 4.0–10.5)
nRBC: 0 % (ref 0.0–0.2)

## 2024-01-16 LAB — CMP (CANCER CENTER ONLY)
ALT: 22 U/L (ref 0–44)
AST: 24 U/L (ref 15–41)
Albumin: 4 g/dL (ref 3.5–5.0)
Alkaline Phosphatase: 135 U/L — ABNORMAL HIGH (ref 38–126)
Anion gap: 8 (ref 5–15)
BUN: 20 mg/dL (ref 8–23)
CO2: 27 mmol/L (ref 22–32)
Calcium: 9.6 mg/dL (ref 8.9–10.3)
Chloride: 103 mmol/L (ref 98–111)
Creatinine: 1.1 mg/dL (ref 0.61–1.24)
GFR, Estimated: >60 mL/min (ref >60)
Glucose, Bld: 136 mg/dL — ABNORMAL HIGH (ref 70–99)
Potassium: 4.8 mmol/L (ref 3.5–5.1)
Sodium: 138 mmol/L (ref 135–145)
Total Bilirubin: 0.8 mg/dL (ref 0.0–1.2)
Total Protein: 6.9 g/dL (ref 6.5–8.1)

## 2024-01-16 MED ORDER — LANREOTIDE ACETATE 120 MG/0.5ML ~~LOC~~ SOLN
120.0000 mg | Freq: Once | SUBCUTANEOUS | Status: AC
  Administered 2024-01-16: 120 mg via SUBCUTANEOUS
  Filled 2024-01-16: qty 120

## 2024-01-18 LAB — CHROMOGRANIN A: Chromogranin A (ng/mL): 1401 ng/mL — ABNORMAL HIGH (ref 0.0–101.8)

## 2024-01-23 ENCOUNTER — Ambulatory Visit

## 2024-02-06 ENCOUNTER — Ambulatory Visit (INDEPENDENT_AMBULATORY_CARE_PROVIDER_SITE_OTHER)

## 2024-02-06 DIAGNOSIS — Z23 Encounter for immunization: Secondary | ICD-10-CM

## 2024-02-06 NOTE — Progress Notes (Signed)
 Patient is in office today for a nurse visit for Flu Immunization. Patient Injection was given in the  Left deltoid. Patient tolerated injection well.

## 2024-02-21 ENCOUNTER — Inpatient Hospital Stay: Admitting: Oncology

## 2024-02-21 ENCOUNTER — Encounter: Payer: Self-pay | Admitting: Oncology

## 2024-02-21 ENCOUNTER — Inpatient Hospital Stay

## 2024-02-21 ENCOUNTER — Inpatient Hospital Stay: Attending: Oncology

## 2024-02-21 VITALS — BP 179/89 | HR 60 | Temp 97.0°F | Resp 16 | Ht 64.0 in | Wt 154.0 lb

## 2024-02-21 DIAGNOSIS — Z79899 Other long term (current) drug therapy: Secondary | ICD-10-CM | POA: Insufficient documentation

## 2024-02-21 DIAGNOSIS — C7B8 Other secondary neuroendocrine tumors: Secondary | ICD-10-CM | POA: Diagnosis not present

## 2024-02-21 DIAGNOSIS — C7A8 Other malignant neuroendocrine tumors: Secondary | ICD-10-CM | POA: Diagnosis not present

## 2024-02-21 DIAGNOSIS — C7A090 Malignant carcinoid tumor of the bronchus and lung: Secondary | ICD-10-CM | POA: Diagnosis present

## 2024-02-21 DIAGNOSIS — I10 Essential (primary) hypertension: Secondary | ICD-10-CM | POA: Diagnosis not present

## 2024-02-21 DIAGNOSIS — C7B02 Secondary carcinoid tumors of liver: Secondary | ICD-10-CM | POA: Diagnosis present

## 2024-02-21 LAB — CBC WITH DIFFERENTIAL/PLATELET
Abs Immature Granulocytes: 0.05 K/uL (ref 0.00–0.07)
Basophils Absolute: 0.1 K/uL (ref 0.0–0.1)
Basophils Relative: 1 %
Eosinophils Absolute: 0.1 K/uL (ref 0.0–0.5)
Eosinophils Relative: 1 %
HCT: 46.5 % (ref 39.0–52.0)
Hemoglobin: 15.4 g/dL (ref 13.0–17.0)
Immature Granulocytes: 1 %
Lymphocytes Relative: 6 %
Lymphs Abs: 0.6 K/uL — ABNORMAL LOW (ref 0.7–4.0)
MCH: 30.8 pg (ref 26.0–34.0)
MCHC: 33.1 g/dL (ref 30.0–36.0)
MCV: 93 fL (ref 80.0–100.0)
Monocytes Absolute: 1.3 K/uL — ABNORMAL HIGH (ref 0.1–1.0)
Monocytes Relative: 13 %
Neutro Abs: 8.3 K/uL — ABNORMAL HIGH (ref 1.7–7.7)
Neutrophils Relative %: 78 %
Platelets: 167 K/uL (ref 150–400)
RBC: 5 MIL/uL (ref 4.22–5.81)
RDW: 14.6 % (ref 11.5–15.5)
WBC: 10.5 K/uL (ref 4.0–10.5)
nRBC: 0 % (ref 0.0–0.2)

## 2024-02-21 LAB — CMP (CANCER CENTER ONLY)
ALT: 19 U/L (ref 0–44)
AST: 23 U/L (ref 15–41)
Albumin: 3.9 g/dL (ref 3.5–5.0)
Alkaline Phosphatase: 122 U/L (ref 38–126)
Anion gap: 8 (ref 5–15)
BUN: 16 mg/dL (ref 8–23)
CO2: 26 mmol/L (ref 22–32)
Calcium: 9.5 mg/dL (ref 8.9–10.3)
Chloride: 103 mmol/L (ref 98–111)
Creatinine: 1.13 mg/dL (ref 0.61–1.24)
GFR, Estimated: >60 mL/min (ref >60)
Glucose, Bld: 154 mg/dL — ABNORMAL HIGH (ref 70–99)
Potassium: 4.1 mmol/L (ref 3.5–5.1)
Sodium: 137 mmol/L (ref 135–145)
Total Bilirubin: 0.8 mg/dL (ref 0.0–1.2)
Total Protein: 6.7 g/dL (ref 6.5–8.1)

## 2024-02-21 MED ORDER — LANREOTIDE ACETATE 120 MG/0.5ML ~~LOC~~ SOLN
120.0000 mg | Freq: Once | SUBCUTANEOUS | Status: AC
  Administered 2024-02-21: 120 mg via SUBCUTANEOUS
  Filled 2024-02-21: qty 120

## 2024-02-21 NOTE — Progress Notes (Signed)
 [facility]  [phone]) [phone] Fax:(336) [phone]  ID: Jason Malone OB: [birth year]  MR#: [id number]  RDW#:[id number]  Patient Care Team: [name], DO as PCP - General (Internal Medicine) [name], MD as PCP - Cardiology (Cardiology) [name], MD as Consulting Physician (Cardiology) [name], MD as Consulting Physician (Oncology)   CHIEF COMPLAINT: 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  INTERVAL HISTORY: Patient returns to clinic today for further evaluation and continuation of lanreotide.  He continues to feel well and remains asymptomatic.  He is tolerating his treatments without significant side effects.  He does not complain of any weakness or fatigue.  He has no neurologic complaints.  He denies any recent fevers or illnesses.  He has a good appetite and denies weight loss.  He denies any chest pain, shortness of breath, or hemoptysis.  He denies any abdominal pain.  He has no nausea, vomiting, constipation, or diarrhea.  He has no urinary complaints.  Patient offers no specific complaints today.  REVIEW OF SYSTEMS:   Review of Systems  Constitutional: Negative.  Negative for fever, malaise/fatigue and weight loss.  Respiratory: Negative.  Negative for cough, hemoptysis and shortness of breath.   Cardiovascular: Negative.  Negative for chest pain and leg swelling.  Gastrointestinal: Negative.  Negative for abdominal pain, diarrhea, nausea and vomiting.  Genitourinary: Negative.  Negative for dysuria.  Musculoskeletal: Negative.  Negative for back pain.  Skin: Negative.  Negative for rash.  Neurological: Negative.  Negative for dizziness, sensory change, focal weakness and weakness.  Psychiatric/Behavioral: Negative.  The patient is not nervous/anxious.     As per HPI. Otherwise, a complete review of systems is negative.  PAST MEDICAL HISTORY: Past Medical History:  Diagnosis Date    Allergic rhinitis    Aortic atherosclerosis    Benign prostatic hypertrophy with lower urinary tract symptoms (LUTS)    Bradycardia    Chest pain    a. 05/2013 MV: EF 70%, no ischemia.   Chronic respiratory failure with hypoxia (HCC)    COPD (chronic obstructive pulmonary disease) (HCC)    Coronary artery disease    COVID-19    Diabetes mellitus without complication (HCC)    last a1c 6.5 09-16-21 no meds   Diverticulitis    DIVERTICULOSIS   Dysrhythmia    Elevated LFTs    Enlarged prostate    Essential hypertension    CONTROLLED ON MEDS   Full dentures    H/O hypokalemia    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 08/28/2016   Hyperlipemia    Hypomagnesemia    IFG (impaired fasting glucose)    Liver cancer (HCC) 12/08/2016   Liver mass, left lobe 08/28/2016   Probably hemangioma; will get MRI of liver, refer to GI   Lung cancer (HCC)    a. carcinoid, left lung, Stage 1b (T2a, N0, cM0);  b. 05/2013 s/p VATS & LULobectomy.   Metabolic acidosis    Monocytosis    Osteopenia    Paroxysmal atrial flutter (HCC)    a. 03/2013->no recurrence;  b. CHA2DS2VASc = 2-->not currently on anticoagulation;  c. 05/2012 Echo: EF 55-60%, normal RV.   Pneumonia    Postinflammatory pulmonary fibrosis (HCC)    Rectal bleeding    Smoking history    Wears glasses     PAST SURGICAL HISTORY: Past Surgical History:  Procedure Laterality Date   BRONCHOSCOPY     04/06/2013   CARDIOVASCULAR STRESS  TEST  05/2013   a. No evidence of ischemia or infarct, EF 70%, no WMAs   CATARACT EXTRACTION W/PHACO Left 07/14/2015   Procedure: CATARACT EXTRACTION PHACO AND INTRAOCULAR LENS PLACEMENT (IOC);  Surgeon: Dene Etienne, MD;  Location: [facility];  Service: Ophthalmology;  Laterality: Left;   CATARACT EXTRACTION W/PHACO Right 10/01/2015   Procedure: CATARACT EXTRACTION PHACO AND INTRAOCULAR LENS PLACEMENT (IOC);  Surgeon: Dene Etienne, MD;  Location: [facility];   Service: Ophthalmology;  Laterality: Right;   COLONOSCOPY W/ POLYPECTOMY     bleed after-had to go to surgery to stop bleeding via colonoscopy   COLONOSCOPY WITH PROPOFOL  N/A 11/19/2015   Procedure: COLONOSCOPY WITH PROPOFOL ;  Surgeon: Reyes LELON Cota, MD;  Location[facility];  Service: Endoscopy;  Laterality: N/A;   DUPUYTREN CONTRACTURE RELEASE  12/15/2011   Procedure: DUPUYTREN CONTRACTURE RELEASE;  Surgeon: Arley JONELLE Curia, MD;  Location: [facility];  Service: Orthopedics;  Laterality: Left;  Fasciotomy left ring finger dupuytrens   ELECTROMAGNETIC NAVIGATION BROCHOSCOPY Left 09/19/2019   Procedure: ELECTROMAGNETIC NAVIGATION BRONCHOSCOPY;  Surgeon: Tamea Dedra CROME, MD;  Location: [facility];  Service: Cardiopulmonary;  Laterality: Left;   FASCIECTOMY Right 11/07/2018   Procedure: SEGMENTAL FASCIECTOMY RIGHT RING FINGER;  Surgeon: Curia Arley, MD;  Location: [facility];  Service: Orthopedics;  Laterality: Right;  AXILLARY BLOCK   FLEXIBLE BRONCHOSCOPY Left 11/26/2019   Procedure: FLEXIBLE BRONCHOSCOPY WITH CRYO THERAPY;  Surgeon: Tamea Dedra CROME, MD;  Location: [facility];  Service: Cardiopulmonary;  Laterality: Left;   FLEXIBLE BRONCHOSCOPY Left 10/14/2021   Procedure: FLEXIBLE BRONCHOSCOPY;  Surgeon: Tamea Dedra CROME, MD;  Location: [facility];  Service: Pulmonary;  Laterality: Left;   IR ANGIOGRAM SELECTIVE EACH ADDITIONAL VESSEL  07/26/2017   IR ANGIOGRAM SELECTIVE EACH ADDITIONAL VESSEL  07/26/2017   IR ANGIOGRAM SELECTIVE EACH ADDITIONAL VESSEL  07/26/2017   IR ANGIOGRAM SELECTIVE EACH ADDITIONAL VESSEL  07/26/2017   IR ANGIOGRAM SELECTIVE EACH ADDITIONAL VESSEL  07/26/2017   IR ANGIOGRAM SELECTIVE EACH ADDITIONAL VESSEL  08/11/2017   IR ANGIOGRAM SELECTIVE EACH ADDITIONAL VESSEL  04/28/2018   IR ANGIOGRAM SELECTIVE EACH ADDITIONAL VESSEL  12/06/2022   IR ANGIOGRAM SELECTIVE EACH ADDITIONAL VESSEL  12/06/2022   IR ANGIOGRAM SELECTIVE EACH ADDITIONAL VESSEL   12/06/2022   IR ANGIOGRAM SELECTIVE EACH ADDITIONAL VESSEL  12/06/2022   IR ANGIOGRAM SELECTIVE EACH ADDITIONAL VESSEL  12/30/2022   IR ANGIOGRAM VISCERAL SELECTIVE  07/26/2017   IR ANGIOGRAM VISCERAL SELECTIVE  08/11/2017   IR ANGIOGRAM VISCERAL SELECTIVE  04/28/2018   IR ANGIOGRAM VISCERAL SELECTIVE  12/06/2022   IR ANGIOGRAM VISCERAL SELECTIVE  12/30/2022   IR EMBO ARTERIAL NOT HEMORR HEMANG INC GUIDE ROADMAPPING  07/26/2017   IR EMBO ARTERIAL NOT HEMORR HEMANG INC GUIDE ROADMAPPING  12/06/2022   IR EMBO TUMOR ORGAN ISCHEMIA INFARCT INC GUIDE ROADMAPPING  08/11/2017   IR EMBO TUMOR ORGAN ISCHEMIA INFARCT INC GUIDE ROADMAPPING  04/28/2018   IR EMBO TUMOR ORGAN ISCHEMIA INFARCT INC GUIDE ROADMAPPING  12/30/2022   IR RADIOLOGIST EVAL & MGMT  07/07/2017   IR RADIOLOGIST EVAL & MGMT  09/07/2017   IR RADIOLOGIST EVAL & MGMT  12/21/2017   IR RADIOLOGIST EVAL & MGMT  03/22/2018   IR RADIOLOGIST EVAL & MGMT  06/08/2018   IR RADIOLOGIST EVAL & MGMT  11/01/2018   IR RADIOLOGIST EVAL & MGMT  03/21/2019   IR RADIOLOGIST EVAL & MGMT  11/08/2019   IR RADIOLOGIST EVAL & MGMT  07/01/2020   IR  RADIOLOGIST EVAL & MGMT  12/18/2020   IR RADIOLOGIST EVAL & MGMT  06/18/2021   IR RADIOLOGIST EVAL & MGMT  12/14/2021   IR RADIOLOGIST EVAL & MGMT  12/31/2021   IR US  GUIDE VASC ACCESS RIGHT  07/26/2017   IR US  GUIDE VASC ACCESS RIGHT  08/11/2017   IR US  GUIDE VASC ACCESS RIGHT  04/28/2018   IR US  GUIDE VASC ACCESS RIGHT  12/06/2022   IR US  GUIDE VASC ACCESS RIGHT  12/30/2022   LUNG LOBECTOMY  06/10/13   upper left lung   MULTIPLE TOOTH EXTRACTIONS     REMOVAL RETAINED LENS Right 11/17/2015   Procedure: REMOVAL RETAINED LENS FROAGMENTS RIGHT EYE;  Surgeon: Dene Etienne, MD;  Location: [facility];  Service: Ophthalmology;  Laterality: Right;   TONSILLECTOMY     UPPER GASTROINTESTINAL ENDOSCOPY  11-03-15   Dr Dessa   VASECTOMY     VIDEO ASSISTED THORACOSCOPY (VATS)/WEDGE RESECTION Left 06/04/2013   Procedure: VIDEO  ASSISTED THORACOSCOPY (VATS)/WEDGE RESECTION;  Surgeon: Dallas KATHEE Jude, MD;  Location: [facility];  Service: Thoracic;  Laterality: Left;   VIDEO BRONCHOSCOPY N/A 06/04/2013   Procedure: VIDEO BRONCHOSCOPY;  Surgeon: Dallas KATHEE Jude, MD;  Location: [facility];  Service: Thoracic;  Laterality: N/A;   VIDEO BRONCHOSCOPY Left 08/12/2021   Procedure: VIDEO BRONCHOSCOPY WITHOUT FLUORO;  Surgeon: Tamea Dedra CROME, MD;  Location: [facility];  Service: Cardiopulmonary;  Laterality: Left;   VIDEO BRONCHOSCOPY Left 09/09/2021   Procedure: VIDEO BRONCHOSCOPY WITHOUT FLUORO;  Surgeon: Tamea Dedra CROME, MD;  Location: [facility];  Service: Cardiopulmonary;  Laterality: Left;    FAMILY HISTORY: Family History  Problem Relation Age of Onset   Stroke Mother    Alzheimer's disease Mother    Hypertension Sister    Hyperlipidemia Sister    Hypertension Brother    Hyperlipidemia Brother    Diabetes Brother     ADVANCED DIRECTIVES (Y/N):  N  HEALTH MAINTENANCE: Social History   Tobacco Use   Smoking status: Former    Current packs/day: 0.00    Average packs/day: 1 pack/day for 40.0 years (40.0 ttl pk-yrs)    Types: Cigarettes    Start date: 12/09/1958    Quit date: 12/09/1998    Years since quitting: 25.2    Passive exposure: Never   Smokeless tobacco: Never  Vaping Use   Vaping status: Never Used  Substance Use Topics   Alcohol use: No   Drug use: No     Colonoscopy:  PAP:  Bone density:  Lipid panel:  Allergies  Allergen Reactions   Tape Itching    Surgical tapes     Current Outpatient Medications  Medication Sig Dispense Refill   amLODipine  (NORVASC ) 5 MG tablet Take 5 mg in the morning and take another 5 mg in the afternoon as needed if BP >150/100 90 tablet 1   aspirin  EC 81 MG tablet Take 81 mg by mouth daily.     b complex vitamins capsule Take 1 capsule by mouth daily.     cholecalciferol (VITAMIN D3) 25 MCG (1000 UNIT) tablet Take 1,000 Units by mouth daily.     Cinnamon 500 MG  capsule Take 500 mg by mouth at bedtime.     DM-APAP-CPM (CORICIDIN HBP PO) Take 2 tablets by mouth daily as needed (allergies/cold symptoms).     glucose blood (ONETOUCH VERIO) test strip USE 1 STRIP TO CHECK GLUCOSE ONCE DAILY 100 each 2   ipratropium (ATROVENT ) 0.03 % nasal spray USE 2 SPRAY(S)  IN EACH NOSTRIL THREE TIMES DAILY 30 mL 11   losartan  (COZAAR ) 100 MG tablet Take 1 tablet (100 mg total) by mouth every morning. 90 tablet 1   Multiple Vitamins-Minerals (MULTIVITAMIN WITH MINERALS) tablet Take 1 tablet by mouth daily.     ondansetron  (ZOFRAN ) 8 MG tablet Take 1 tablet (8 mg total) by mouth 2 (two) times daily as needed for nausea or vomiting. 20 tablet 1   polycarbophil (FIBERCON) 625 MG tablet Take 625 mg by mouth 2 (two) times daily.     polyvinyl alcohol (LIQUIFILM TEARS) 1.4 % ophthalmic solution Place 1 drop into both eyes as needed for dry eyes.     rosuvastatin  (CRESTOR ) 20 MG tablet Take 1 tablet (20 mg total) by mouth daily. 90 tablet 1   vitamin C (ASCORBIC ACID ) 500 MG tablet Take 500 mg by mouth daily.     zinc  sulfate 220 (50 Zn) MG capsule Take 1 capsule (220 mg total) by mouth daily. 30 capsule 0   No current facility-administered medications for this visit.   Facility-Administered Medications Ordered in Other Visits  Medication Dose Route Frequency Provider Last Rate Last Admin   lanreotide acetate  (SOMATULINE DEPOT ) injection 120 mg  120 mg Subcutaneous Once Perline Awe J, MD       lanreotide acetate  (SOMATULINE DEPOT ) injection 120 mg  120 mg Subcutaneous Once Asahel Risden J, MD        OBJECTIVE: Vitals:   02/21/24 0956  BP: (!) 179/89  Pulse: 60  Resp: 16  Temp: (!) 97 F (36.1 C)  SpO2: 98%     Body mass index is 26.43 kg/m.    ECOG FS:0 - Asymptomatic  General: Well-developed, well-nourished, no acute distress. Eyes: Pink conjunctiva, anicteric sclera. HEENT: Normocephalic, moist mucous membranes. Lungs: No audible wheezing or  coughing. Heart: Regular rate and rhythm. Abdomen: Soft, nontender, no obvious distention. Musculoskeletal: No edema, cyanosis, or clubbing. Neuro: Alert, answering all questions appropriately. Cranial nerves grossly intact. Skin: No rashes or petechiae noted. Psych: Normal affect.  LAB RESULTS:  Lab Results  Component Value Date   NA 137 02/21/2024   K 4.1 02/21/2024   CL 103 02/21/2024   CO2 26 02/21/2024   GLUCOSE 154 (H) 02/21/2024   BUN 16 02/21/2024   CREATININE 1.13 02/21/2024   CALCIUM  9.5 02/21/2024   PROT 6.7 02/21/2024   ALBUMIN  3.9 02/21/2024   AST 23 02/21/2024   ALT 19 02/21/2024   ALKPHOS 122 02/21/2024   BILITOT 0.8 02/21/2024   GFRNONAA >60 02/21/2024   GFRAA >60 01/29/2020    Lab Results  Component Value Date   WBC 10.5 02/21/2024   NEUTROABS 8.3 (H) 02/21/2024   HGB 15.4 02/21/2024   HCT 46.5 02/21/2024   MCV 93.0 02/21/2024   PLT 167 02/21/2024     STUDIES: No results found.   ONCOLOGY HISTORY: Patient's pathology, imaging, and outside facility notes reviewed extensively. Patient underwent left upper lobe lobectomy in [place] on June 04, 2013. Final pathology results revealed a well differentiated neuroendocrine tumor with positive bronchial margins. Patient did not receive adjuvant XRT or chemotherapy at that time.  His most recent Y 90 ablation to his liver occurred on April 28, 2018.   CT scan results from December 28, 2021  with 2 new foci noted in his liver likely consistent with progressive disease.  Patient subsequently underwent 4 treatments with Lutathera  completing on August 04, 2022.  Patient also underwent Y90 embolization on December 30, 2022.  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  Patient's most recent MRI on November 17, 2023 reviewed independently with overall improvement of  disease burden.  No new suspicious hepatic lesions were reported.  Proceed with lanreotide today.  Return to clinic in 1 and 2 months for lanreotide only.  Patient will then return to clinic in 3 months with repeat laboratory, further evaluation, and continuation of treatment.  Will repeat imaging with chest CT and MRI of the abdomen in January 2026.   Left mainstem endobronchial mass: Consistent with neuroendocrine tumor.  No plans for pulmonary intervention at this time.  Patient reports he has a follow-up appointment with pulmonary in the next 1 to 2 weeks. Hypertension: Patient's blood pressure is moderately elevated today.  Continue follow-up and treatment per primary care.    Patient expressed understanding and was in agreement with this plan. He also understands that He can call clinic at any time with any questions, concerns, or complaints.    Cancer Staging  Neuroendocrine carcinoma of lung [facility]) Staging form: Lung, AJCC 8th Edition - Clinical stage from 12/28/2016: Stage IVA (cT2a, cN0, cM1b) - Signed by [name], MD on 12/28/2016 Sites of metastasis: Liver   Evalene PARAS Jacobo, MD   02/21/2024 10:11 AM

## 2024-02-22 ENCOUNTER — Encounter: Payer: Self-pay | Admitting: Oncology

## 2024-02-22 LAB — CHROMOGRANIN A: Chromogranin A (ng/mL): 1142 ng/mL — ABNORMAL HIGH (ref 0.0–101.8)

## 2024-03-02 ENCOUNTER — Encounter: Payer: Self-pay | Admitting: Pulmonary Disease

## 2024-03-02 ENCOUNTER — Ambulatory Visit: Admitting: Pulmonary Disease

## 2024-03-02 VITALS — BP 136/70 | HR 56 | Temp 97.8°F | Ht 64.0 in | Wt 153.8 lb

## 2024-03-02 DIAGNOSIS — R053 Chronic cough: Secondary | ICD-10-CM | POA: Diagnosis not present

## 2024-03-02 DIAGNOSIS — Z8616 Personal history of COVID-19: Secondary | ICD-10-CM | POA: Diagnosis not present

## 2024-03-02 DIAGNOSIS — C7A8 Other malignant neuroendocrine tumors: Secondary | ICD-10-CM | POA: Diagnosis not present

## 2024-03-02 DIAGNOSIS — J8410 Pulmonary fibrosis, unspecified: Secondary | ICD-10-CM

## 2024-03-02 LAB — NITRIC OXIDE: Nitric Oxide: 5

## 2024-03-02 NOTE — Progress Notes (Signed)
 Subjective:    Patient ID: Jason Malone, male    DOB: 11/12/1940, 83 y.o.   MRN: [id number]  Patient Care Team: [name], DO as PCP - General (Internal Medicine) [name], MD as PCP - Cardiology (Cardiology) [name], MD as Consulting Physician (Cardiology) [name], MD as Consulting Physician (Oncology)  Chief Complaint  Patient presents with   Lung Cancer    Shortness of breath on exertion.     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 I last saw him on 01 September 2023.   HPI Discussed the use of AI scribe software for clinical note transcription with the patient, who gave verbal consent to proceed.  History of Present Illness   Jason Malone is an 83 year old male with metastatic carcinoid tumor who presents for follow-up.  He experiences shortness of breath, particularly during strenuous activities, and sometimes feels like he is not getting enough air. He also notes weakness in his legs, despite this he maintains good oxygen saturation levels, typically ranging from 93 to 97%.  He is scheduled for a CT scan of the lungs and an MRI on January 14th. He receives a monthly injection as part of his treatment and had a Y90 treatment in August, which showed a decrease in the affected areas of the liver.  He mentions that using a nasal spray helps improve his breathing, and he sometimes takes quercetin when he feels he cannot breathe well, which he finds effective.   Recall that he has had tumor debulking to the recurrent areas of carcinoid tumor in his left lung.  This was last performed in June 2023 with spray cryotherapy debulking and balloon dilation.  His PET/CT performed on 28 December 2021 showed no evidence of recurrence in that area.      Review of Systems A 10 point review of systems was  performed and it is as noted above otherwise negative.   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   Respiratory failure (HCC) 06/11/2019   Elevated LFTs 06/11/2019   Pneumonia due to COVID-19 virus 06/10/2019   Contracture of palmar fascia 10/11/2018   Microalbuminuria due to type 2 diabetes mellitus (HCC) 04/12/2018   Type 2 diabetes mellitus (HCC) 04/10/2018   Osteopenia determined by x-ray 01/07/2017   Neuroendocrine carcinoma of lung (HCC) 12/28/2016   Low back pain 12/17/2016   Liver mass, left lobe 08/28/2016   Aortic atherosclerosis 04/06/2016   Coronary artery disease involving native heart without angina pectoris 04/06/2016   History of colonic polyps 10/28/2015   Medication monitoring encounter 08/15/2015   Paroxysmal atrial flutter (HCC)    Elevated serum alkaline phosphatase level 03/05/2015   Allergic rhinitis    BPH without urinary obstruction    Hyperlipidemia 02/13/2014   Orthostasis 07/18/2013   Sinus bradycardia 07/18/2013   S/P lobectomy of lung 06/04/2013   Atrial flutter (HCC) 04/09/2013  Smoking history 04/09/2013   Essential hypertension 04/09/2013   Rectal bleeding 12/15/2012    Social History   Tobacco Use   Smoking status: Former    Current packs/day: 0.00    Average packs/day: 1 pack/day for 40.0 years (40.0 ttl pk-yrs)    Types: Cigarettes    Start date: 12/09/1958    Quit date: 12/09/1998    Years since quitting: 25.2    Passive exposure: Never    Smokeless tobacco: Never  Substance Use Topics   Alcohol use: No    Allergies  Allergen Reactions   Tape Itching    Surgical tapes     Current Meds  Medication Sig   amLODipine  (NORVASC ) 5 MG tablet Take 5 mg in the morning and take another 5 mg in the afternoon as needed if BP >150/100   aspirin  EC 81 MG tablet Take 81 mg by mouth daily.   b complex vitamins capsule Take 1 capsule by mouth daily.   cholecalciferol (VITAMIN D3) 25 MCG (1000 UNIT) tablet Take 1,000 Units by mouth daily.   Cinnamon 500 MG capsule Take 500 mg by mouth at bedtime.   DM-APAP-CPM (CORICIDIN HBP PO) Take 2 tablets by mouth daily as needed (allergies/cold symptoms).   glucose blood (ONETOUCH VERIO) test strip USE 1 STRIP TO CHECK GLUCOSE ONCE DAILY   ipratropium (ATROVENT ) 0.03 % nasal spray USE 2 SPRAY(S) IN EACH NOSTRIL THREE TIMES DAILY   losartan  (COZAAR ) 100 MG tablet Take 1 tablet (100 mg total) by mouth every morning.   Multiple Vitamins-Minerals (MULTIVITAMIN WITH MINERALS) tablet Take 1 tablet by mouth daily.   ondansetron  (ZOFRAN ) 8 MG tablet Take 1 tablet (8 mg total) by mouth 2 (two) times daily as needed for nausea or vomiting.   polycarbophil (FIBERCON) 625 MG tablet Take 625 mg by mouth 2 (two) times daily.   polyvinyl alcohol (LIQUIFILM TEARS) 1.4 % ophthalmic solution Place 1 drop into both eyes as needed for dry eyes.   rosuvastatin  (CRESTOR ) 20 MG tablet Take 1 tablet (20 mg total) by mouth daily.   vitamin C (ASCORBIC ACID ) 500 MG tablet Take 500 mg by mouth daily.   zinc  sulfate 220 (50 Zn) MG capsule Take 1 capsule (220 mg total) by mouth daily.    Immunization History  Administered Date(s) Administered   Fluad Quad(high Dose 65+) 01/24/2019, 01/23/2020, 01/30/2021, 02/01/2022   Fluad Trivalent(High Dose 65+) 02/07/2023   INFLUENZA, HIGH DOSE SEASONAL PF 02/12/2016, 02/14/2017, 01/17/2018, 02/06/2024   Influenza,inj,Quad PF,6+ Mos 02/14/2015   PFIZER(Purple Top)SARS-COV-2  Vaccination 05/24/2019, 10/02/2019, 04/14/2020   PNEUMOCOCCAL CONJUGATE-20 12/28/2021   Pneumococcal Conjugate-13 02/01/2014   Pneumococcal Polysaccharide-23 05/10/2008        Objective:     BP 136/70   Pulse (!) 56   Temp 97.8 F (36.6 C) (Temporal)   Ht 5' 4 (1.626 m)   Wt 153 lb 12.8 oz (69.8 kg)   SpO2 98%   BMI 26.40 kg/m   SpO2: 98 %  GENERAL: Awake and alert, well developed, well nourished gentleman, in no respiratory distress, fully ambulatory, no conversational dyspnea. HEAD: Normocephalic, atraumatic. EYES: Pupils equal, round, reactive to light.  No scleral icterus. MOUTH: Wears dentures.  No thrush. NECK: Supple. No thyromegaly. No nodules. No JVD.  Trachea is midline PULMONARY:  Symmetrical and good air entry,no wheezes or rhonchi noted, no crackles. CARDIOVASCULAR: S1 and S2.  Regular rate and rhythm.No rubs murmurs or gallops appreciated. GASTROINTESTINAL: Benign. MUSCULOSKELETAL: No joint deformity, no clubbing, no edema. NEUROLOGIC:  No focal deficit, no gait disturbance, speech is fluent. SKIN: Intact,warm,dry. No rashes noted on limited exam. PSYCH: Mood and behavior normal.   Ambulatory oxymetry was performed today:  At rest on room air oxygen saturation was 97%, the patient ambulated at a normal pace, completed 3 laps, O2 nadir 96%, mild shortness of breath.  Resting heart rate was 64 bpm at maximum for this exercise 71 bpm.  No evidence of desaturations during ambulation.  Somewhat blunted cardiac response to exercise.   Lab Results  Component Value Date   NITRICOXIDE 5 03/02/2024  *This result suggests low (<25) Type II (T2) airway inflammation indicating a low likelihood of active T2-driven airway inflammation.  In a patient with active T2 driven asthma management it suggests good control.    Assessment & Plan:     ICD-10-CM   1. Neuroendocrine carcinoma of lung (HCC) -stage IVa  C7A.8     2. Chronic cough  R05.3 Nitric oxide     3.  Postinflammatory pulmonary fibrosis (HCC)  J84.10     4. Personal history of COVID-19  Z86.16       Orders Placed This Encounter  Procedures   Nitric oxide    Discussion:    Metastatic carcinoid tumor Metastatic carcinoid tumor with previous Y90 treatment showing decreased areas in the liver. No recent scans, but a CT of the lung and MRI are scheduled for January 14th. Monthly injections are ongoing. - Continue monthly injections - Follow-up after January CT scan to determine if further interventions are necessary in the lung  Dyspnea Dyspnea on exertion with episodes of shortness of breath and leg weakness. Oxygen saturation ranges from 93 to 97. No increased airway inflammation. Nasal spray provides relief, and quercetin is effective for difficulty breathing. - Continue using nasal spray as needed - Continue taking quercetin as needed  - Patient is reluctant to try inhalers or any more medications.    Advised if symptoms do not improve or worsen, to please contact office for sooner follow up or seek emergency care.    I spent 31 minutes of dedicated to the care of this patient on the date of this encounter to include pre-visit review of records, face-to-face time with the patient discussing conditions above, post visit ordering of testing, clinical documentation with the electronic health record, making appropriate referrals as documented, and communicating necessary findings to members of the patients care team.     C. Leita Sanders, MD Advanced Bronchoscopy [facility]    *This note was generated using voice recognition software/Dragon and/or AI transcription program.  Despite best efforts to proofread, errors can occur which can change the meaning. Any transcriptional errors that result from this process are unintentional and may not be fully corrected at the time of dictation.

## 2024-03-02 NOTE — Patient Instructions (Signed)
 VISIT SUMMARY:  You came in today for a follow-up visit regarding your metastatic carcinoid tumor. We discussed your symptoms, including shortness of breath, leg weakness, and occasional low oxygen levels. You are scheduled for a CT scan of your lungs and an MRI on January 14th. We also reviewed your current treatments and their effectiveness.  YOUR PLAN:  -METASTATIC CARCINOID TUMOR: A metastatic carcinoid tumor is a type of cancer that has spread from its original site to other parts of the body. You have been receiving monthly injections and had a Y90 treatment in August, which showed a decrease in the affected areas of your liver. We will continue with your monthly injections and follow up with the scheduled CT scan and MRI on January 14th.  -DYSPNEA: Dyspnea means difficulty breathing or shortness of breath. You experience this particularly during strenuous activities and sometimes have low oxygen levels. You find relief using a nasal spray and taking quercetin when needed. Continue using the nasal spray and taking quercetin as needed to manage your symptoms.  INSTRUCTIONS:  Please continue with your monthly injections for the metastatic carcinoid tumor. Remember to use your nasal spray and take quercetin as needed for shortness of breath. Attend your scheduled CT scan of the lungs and MRI on January 14th. Follow up with us  after your scans to review the results and discuss the next steps.

## 2024-03-05 ENCOUNTER — Encounter: Payer: Self-pay | Admitting: Pulmonary Disease

## 2024-03-26 ENCOUNTER — Inpatient Hospital Stay: Attending: Oncology

## 2024-03-26 DIAGNOSIS — C7A090 Malignant carcinoid tumor of the bronchus and lung: Secondary | ICD-10-CM | POA: Diagnosis present

## 2024-03-26 DIAGNOSIS — C7B02 Secondary carcinoid tumors of liver: Secondary | ICD-10-CM | POA: Insufficient documentation

## 2024-03-26 DIAGNOSIS — C7A8 Other malignant neuroendocrine tumors: Secondary | ICD-10-CM

## 2024-03-26 DIAGNOSIS — Z79899 Other long term (current) drug therapy: Secondary | ICD-10-CM | POA: Diagnosis not present

## 2024-03-26 MED ORDER — LANREOTIDE ACETATE 120 MG/0.5ML ~~LOC~~ SOLN
120.0000 mg | Freq: Once | SUBCUTANEOUS | Status: AC
  Administered 2024-03-26: 120 mg via SUBCUTANEOUS
  Filled 2024-03-26: qty 0.5

## 2024-04-24 ENCOUNTER — Inpatient Hospital Stay: Attending: Oncology

## 2024-04-24 DIAGNOSIS — C7A090 Malignant carcinoid tumor of the bronchus and lung: Secondary | ICD-10-CM | POA: Diagnosis present

## 2024-04-24 DIAGNOSIS — C7B02 Secondary carcinoid tumors of liver: Secondary | ICD-10-CM | POA: Diagnosis present

## 2024-04-24 DIAGNOSIS — C7A8 Other malignant neuroendocrine tumors: Secondary | ICD-10-CM

## 2024-04-24 DIAGNOSIS — Z79899 Other long term (current) drug therapy: Secondary | ICD-10-CM | POA: Diagnosis not present

## 2024-04-24 MED ORDER — LANREOTIDE ACETATE 120 MG/0.5ML ~~LOC~~ SOLN
120.0000 mg | Freq: Once | SUBCUTANEOUS | Status: AC
  Administered 2024-04-24: 11:00:00 120 mg via SUBCUTANEOUS
  Filled 2024-04-24: qty 0.5

## 2024-05-17 ENCOUNTER — Telehealth: Payer: Self-pay | Admitting: Oncology

## 2024-05-17 ENCOUNTER — Other Ambulatory Visit: Payer: Self-pay | Admitting: Pulmonary Disease

## 2024-05-17 NOTE — Telephone Encounter (Signed)
 Called pt to confirm CT for 1/14 - pt confirmed date/time/location - [facility]

## 2024-05-23 ENCOUNTER — Ambulatory Visit
Admission: RE | Admit: 2024-05-23 | Discharge: 2024-05-23 | Disposition: A | Discharge status: Home / Self Care | Source: Ambulatory Visit | Attending: Oncology | Admitting: Oncology

## 2024-05-23 DIAGNOSIS — C7A8 Other malignant neuroendocrine tumors: Secondary | ICD-10-CM | POA: Insufficient documentation

## 2024-05-23 DIAGNOSIS — C7B8 Other secondary neuroendocrine tumors: Secondary | ICD-10-CM

## 2024-05-23 MED ORDER — GADOBUTROL 1 MMOL/ML IV SOLN
7.0000 mL | Freq: Once | INTRAVENOUS | Status: AC | PRN
  Administered 2024-05-23: 7 mL via INTRAVENOUS

## 2024-05-23 MED ORDER — IOHEXOL 300 MG/ML  SOLN
75.0000 mL | Freq: Once | INTRAMUSCULAR | Status: AC | PRN
  Administered 2024-05-23: 75 mL via INTRAVENOUS

## 2024-05-24 ENCOUNTER — Telehealth: Payer: Self-pay

## 2024-05-24 DIAGNOSIS — Z1283 Encounter for screening for malignant neoplasm of skin: Secondary | ICD-10-CM

## 2024-05-24 NOTE — Telephone Encounter (Signed)
 Copied from CRM [id number]. Topic: Referral - Status >> May 24, 2024  2:24 PM Travis F wrote: Reason for CRM:  [facility] is calling in because they received a referral for patient that they believe was supposed to go to [facility]. They were just calling in to make the office aware so the referral could be sent to the correct place.

## 2024-05-24 NOTE — Telephone Encounter (Signed)
 Copied from CRM #[id number]. Topic: Referral - Request for Referral >> May 24, 2024  9:31 AM Myrick T wrote: Did the patient discuss referral with their provider in the last year? Yes  Appointment offered? No, patient has been seeing the specialist and now per insurance they need referral  Type of order/referral and detailed reason for visit: Dermatologist  Preference of office, provider, location: [facility] [address] [place], [place] [address]; [phone]  If referral order, have you been seen by this specialty before? Yes Same location  Can we respond through MyChart? Yes

## 2024-05-31 ENCOUNTER — Inpatient Hospital Stay: Admitting: Oncology

## 2024-05-31 ENCOUNTER — Inpatient Hospital Stay

## 2024-05-31 ENCOUNTER — Encounter: Payer: Self-pay | Admitting: Oncology

## 2024-05-31 ENCOUNTER — Inpatient Hospital Stay: Attending: Oncology

## 2024-05-31 VITALS — BP 145/84 | HR 58 | Temp 97.6°F | Ht 64.0 in | Wt 155.0 lb

## 2024-05-31 DIAGNOSIS — E119 Type 2 diabetes mellitus without complications: Secondary | ICD-10-CM | POA: Insufficient documentation

## 2024-05-31 DIAGNOSIS — C7A090 Malignant carcinoid tumor of the bronchus and lung: Secondary | ICD-10-CM | POA: Diagnosis present

## 2024-05-31 DIAGNOSIS — C7A8 Other malignant neuroendocrine tumors: Secondary | ICD-10-CM | POA: Diagnosis not present

## 2024-05-31 DIAGNOSIS — E785 Hyperlipidemia, unspecified: Secondary | ICD-10-CM | POA: Diagnosis not present

## 2024-05-31 DIAGNOSIS — Z87891 Personal history of nicotine dependence: Secondary | ICD-10-CM | POA: Insufficient documentation

## 2024-05-31 DIAGNOSIS — I10 Essential (primary) hypertension: Secondary | ICD-10-CM | POA: Insufficient documentation

## 2024-05-31 DIAGNOSIS — C7B02 Secondary carcinoid tumors of liver: Secondary | ICD-10-CM | POA: Insufficient documentation

## 2024-05-31 DIAGNOSIS — Z79899 Other long term (current) drug therapy: Secondary | ICD-10-CM | POA: Diagnosis not present

## 2024-05-31 LAB — CMP (CANCER CENTER ONLY)
ALT: 23 U/L (ref 0–44)
AST: 26 U/L (ref 15–41)
Albumin: 4.4 g/dL (ref 3.5–5.0)
Alkaline Phosphatase: 132 U/L — ABNORMAL HIGH (ref 38–126)
Anion gap: 9 (ref 5–15)
BUN: 23 mg/dL (ref 8–23)
CO2: 26 mmol/L (ref 22–32)
Calcium: 10 mg/dL (ref 8.9–10.3)
Chloride: 105 mmol/L (ref 98–111)
Creatinine: 1.04 mg/dL (ref 0.61–1.24)
GFR, Estimated: >60 mL/min
Glucose, Bld: 98 mg/dL (ref 70–99)
Potassium: 4.5 mmol/L (ref 3.5–5.1)
Sodium: 141 mmol/L (ref 135–145)
Total Bilirubin: 0.7 mg/dL (ref 0.0–1.2)
Total Protein: 7 g/dL (ref 6.5–8.1)

## 2024-05-31 LAB — CBC WITH DIFFERENTIAL/PLATELET
Abs Immature Granulocytes: 0.03 K/uL (ref 0.00–0.07)
Basophils Absolute: 0.1 K/uL (ref 0.0–0.1)
Basophils Relative: 1 %
Eosinophils Absolute: 0.1 K/uL (ref 0.0–0.5)
Eosinophils Relative: 1 %
HCT: 49.1 % (ref 39.0–52.0)
Hemoglobin: 16.2 g/dL (ref 13.0–17.0)
Immature Granulocytes: 0 %
Lymphocytes Relative: 8 %
Lymphs Abs: 0.7 K/uL (ref 0.7–4.0)
MCH: 30.5 pg (ref 26.0–34.0)
MCHC: 33 g/dL (ref 30.0–36.0)
MCV: 92.5 fL (ref 80.0–100.0)
Monocytes Absolute: 1.3 K/uL — ABNORMAL HIGH (ref 0.1–1.0)
Monocytes Relative: 15 %
Neutro Abs: 6.2 K/uL (ref 1.7–7.7)
Neutrophils Relative %: 75 %
Platelets: 163 K/uL (ref 150–400)
RBC: 5.31 MIL/uL (ref 4.22–5.81)
RDW: 14.3 % (ref 11.5–15.5)
WBC: 8.3 K/uL (ref 4.0–10.5)
nRBC: 0 % (ref 0.0–0.2)

## 2024-05-31 MED ORDER — LANREOTIDE ACETATE 120 MG/0.5ML ~~LOC~~ SOLN
120.0000 mg | Freq: Once | SUBCUTANEOUS | Status: AC
  Administered 2024-05-31: 120 mg via SUBCUTANEOUS
  Filled 2024-05-31: qty 0.5

## 2024-05-31 NOTE — Progress Notes (Signed)
 " [facility]  [phone] Fax:([phone]  ID: Gayla LELON Epps OB: Mar 02, 1941  MR#: [id number]  RDW#:[id number]  Patient Care Team: [name], DO as PCP - General (Internal Medicine) [name], MD as PCP - Cardiology (Cardiology) [name], MD as Consulting Physician (Cardiology) [name], MD as Consulting Physician (Oncology)   CHIEF COMPLAINT: 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  INTERVAL HISTORY: Patient returns to clinic today for repeat laboratory, further evaluation, and continuation of lanreotide.  He has noticed increase in cough and mild shortness of breath recently, but otherwise feels well.  He continues to tolerate his treatments without significant side effects.  He does not complain of any weakness or fatigue.  He has no neurologic complaints.  He denies any recent fevers or illnesses.  He has a good appetite and denies weight loss.  He denies any chest pain or hemoptysis.  He denies any abdominal pain.  He has no nausea, vomiting, constipation, or diarrhea.  He has no urinary complaints.  Patient offers no further specific complaints today.  REVIEW OF SYSTEMS:   Review of Systems  Constitutional: Negative.  Negative for fever, malaise/fatigue and weight loss.  Respiratory:  Positive for cough and shortness of breath. Negative for hemoptysis.   Cardiovascular: Negative.  Negative for chest pain and leg swelling.  Gastrointestinal: Negative.  Negative for abdominal pain, diarrhea, nausea and vomiting.  Genitourinary: Negative.  Negative for dysuria.  Musculoskeletal: Negative.  Negative for back pain.  Skin: Negative.  Negative for rash.  Neurological: Negative.  Negative for dizziness, sensory change, focal weakness and weakness.  Psychiatric/Behavioral: Negative.  The patient is not nervous/anxious.     As per HPI. Otherwise, a complete review of systems is negative.  PAST  MEDICAL HISTORY: Past Medical History:  Diagnosis Date   Allergic rhinitis    Aortic atherosclerosis    Benign prostatic hypertrophy with lower urinary tract symptoms (LUTS)    Bradycardia    Chest pain    a. 05/2013 MV: EF 70%, no ischemia.   Chronic respiratory failure with hypoxia (HCC)    COPD (chronic obstructive pulmonary disease) (HCC)    Coronary artery disease    COVID-19    Diabetes mellitus without complication (HCC)    last a1c 6.5 09-16-21 no meds   Diverticulitis    DIVERTICULOSIS   Dysrhythmia    Elevated LFTs    Enlarged prostate    Essential hypertension    CONTROLLED ON MEDS   Full dentures    H/O hypokalemia    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 08/28/2016   Hyperlipemia    Hypomagnesemia    IFG (impaired fasting glucose)    Liver cancer (HCC) 12/08/2016   Liver mass, left lobe 08/28/2016   Probably hemangioma; will get MRI of liver, refer to GI   Lung cancer (HCC)    a. carcinoid, left lung, Stage 1b (T2a, N0, cM0);  b. 05/2013 s/p VATS & LULobectomy.   Metabolic acidosis    Monocytosis    Osteopenia    Paroxysmal atrial flutter (HCC)    a. 03/2013->no recurrence;  b. CHA2DS2VASc = 2-->not currently on anticoagulation;  c. 05/2012 Echo: EF 55-60%, normal RV.   Pneumonia    Postinflammatory pulmonary fibrosis (HCC)    Rectal bleeding    Smoking history    Wears glasses     PAST SURGICAL HISTORY: Past Surgical History:  Procedure Laterality Date  BRONCHOSCOPY     04/06/2013   CARDIOVASCULAR STRESS TEST  05/2013   a. No evidence of ischemia or infarct, EF 70%, no WMAs   CATARACT EXTRACTION W/PHACO Left 07/14/2015   Procedure: CATARACT EXTRACTION PHACO AND INTRAOCULAR LENS PLACEMENT (IOC);  Surgeon: Dene Etienne, MD;  Location: [facility];  Service: Ophthalmology;  Laterality: Left;   CATARACT EXTRACTION W/PHACO Right 10/01/2015   Procedure: CATARACT EXTRACTION PHACO AND INTRAOCULAR LENS PLACEMENT (IOC);  Surgeon:  Dene Etienne, MD;  Location: [facility];  Service: Ophthalmology;  Laterality: Right;   COLONOSCOPY W/ POLYPECTOMY     bleed after-had to go to surgery to stop bleeding via colonoscopy   COLONOSCOPY WITH PROPOFOL  N/A 11/19/2015   Procedure: COLONOSCOPY WITH PROPOFOL ;  Surgeon: Reyes LELON Cota, MD;  Location: [facility];  Service: Endoscopy;  Laterality: N/A;   DUPUYTREN CONTRACTURE RELEASE  12/15/2011   Procedure: DUPUYTREN CONTRACTURE RELEASE;  Surgeon: Arley JONELLE Curia, MD;  Location: [facility];  Service: Orthopedics;  Laterality: Left;  Fasciotomy left ring finger dupuytrens   ELECTROMAGNETIC NAVIGATION BROCHOSCOPY Left 09/19/2019   Procedure: ELECTROMAGNETIC NAVIGATION BRONCHOSCOPY;  Surgeon: Tamea Dedra CROME, MD;  Location: [facility];  Service: Cardiopulmonary;  Laterality: Left;   FASCIECTOMY Right 11/07/2018   Procedure: SEGMENTAL FASCIECTOMY RIGHT RING FINGER;  Surgeon: Curia Arley, MD;  Location: [facility];  Service: Orthopedics;  Laterality: Right;  AXILLARY BLOCK   FLEXIBLE BRONCHOSCOPY Left 11/26/2019   Procedure: FLEXIBLE BRONCHOSCOPY WITH CRYO THERAPY;  Surgeon: Tamea Dedra CROME, MD;  Location: [facility];  Service: Cardiopulmonary;  Laterality: Left;   FLEXIBLE BRONCHOSCOPY Left 10/14/2021   Procedure: FLEXIBLE BRONCHOSCOPY;  Surgeon: Tamea Dedra CROME, MD;  Location: [facility];  Service: Pulmonary;  Laterality: Left;   IR ANGIOGRAM SELECTIVE EACH ADDITIONAL VESSEL  07/26/2017   IR ANGIOGRAM SELECTIVE EACH ADDITIONAL VESSEL  07/26/2017   IR ANGIOGRAM SELECTIVE EACH ADDITIONAL VESSEL  07/26/2017   IR ANGIOGRAM SELECTIVE EACH ADDITIONAL VESSEL  07/26/2017   IR ANGIOGRAM SELECTIVE EACH ADDITIONAL VESSEL  07/26/2017   IR ANGIOGRAM SELECTIVE EACH ADDITIONAL VESSEL  08/11/2017   IR ANGIOGRAM SELECTIVE EACH ADDITIONAL VESSEL  04/28/2018   IR ANGIOGRAM SELECTIVE EACH ADDITIONAL VESSEL  12/06/2022   IR ANGIOGRAM SELECTIVE EACH ADDITIONAL VESSEL   12/06/2022   IR ANGIOGRAM SELECTIVE EACH ADDITIONAL VESSEL  12/06/2022   IR ANGIOGRAM SELECTIVE EACH ADDITIONAL VESSEL  12/06/2022   IR ANGIOGRAM SELECTIVE EACH ADDITIONAL VESSEL  12/30/2022   IR ANGIOGRAM VISCERAL SELECTIVE  07/26/2017   IR ANGIOGRAM VISCERAL SELECTIVE  08/11/2017   IR ANGIOGRAM VISCERAL SELECTIVE  04/28/2018   IR ANGIOGRAM VISCERAL SELECTIVE  12/06/2022   IR ANGIOGRAM VISCERAL SELECTIVE  12/30/2022   IR EMBO ARTERIAL NOT HEMORR HEMANG INC GUIDE ROADMAPPING  07/26/2017   IR EMBO ARTERIAL NOT HEMORR HEMANG INC GUIDE ROADMAPPING  12/06/2022   IR EMBO TUMOR ORGAN ISCHEMIA INFARCT INC GUIDE ROADMAPPING  08/11/2017   IR EMBO TUMOR ORGAN ISCHEMIA INFARCT INC GUIDE ROADMAPPING  04/28/2018   IR EMBO TUMOR ORGAN ISCHEMIA INFARCT INC GUIDE ROADMAPPING  12/30/2022   IR RADIOLOGIST EVAL & MGMT  07/07/2017   IR RADIOLOGIST EVAL & MGMT  09/07/2017   IR RADIOLOGIST EVAL & MGMT  12/21/2017   IR RADIOLOGIST EVAL & MGMT  03/22/2018   IR RADIOLOGIST EVAL & MGMT  06/08/2018   IR RADIOLOGIST EVAL & MGMT  11/01/2018   IR RADIOLOGIST EVAL & MGMT  03/21/2019   IR RADIOLOGIST EVAL & MGMT  11/08/2019  IR RADIOLOGIST EVAL & MGMT  07/01/2020   IR RADIOLOGIST EVAL & MGMT  12/18/2020   IR RADIOLOGIST EVAL & MGMT  06/18/2021   IR RADIOLOGIST EVAL & MGMT  12/14/2021   IR RADIOLOGIST EVAL & MGMT  12/31/2021   IR US  GUIDE VASC ACCESS RIGHT  07/26/2017   IR US  GUIDE VASC ACCESS RIGHT  08/11/2017   IR US  GUIDE VASC ACCESS RIGHT  04/28/2018   IR US  GUIDE VASC ACCESS RIGHT  12/06/2022   IR US  GUIDE VASC ACCESS RIGHT  12/30/2022   LUNG LOBECTOMY  06/10/13   upper left lung   MULTIPLE TOOTH EXTRACTIONS     REMOVAL RETAINED LENS Right 11/17/2015   Procedure: REMOVAL RETAINED LENS FROAGMENTS RIGHT EYE;  Surgeon: Dene Etienne, MD;  Location: [facility];  Service: Ophthalmology;  Laterality: Right;   TONSILLECTOMY     UPPER GASTROINTESTINAL ENDOSCOPY  11-03-15   Dr Dessa   VASECTOMY     VIDEO ASSISTED THORACOSCOPY  (VATS)/WEDGE RESECTION Left 06/04/2013   Procedure: VIDEO ASSISTED THORACOSCOPY (VATS)/WEDGE RESECTION;  Surgeon: Dallas KATHEE Jude, MD;  Location: [facility];  Service: Thoracic;  Laterality: Left;   VIDEO BRONCHOSCOPY N/A 06/04/2013   Procedure: VIDEO BRONCHOSCOPY;  Surgeon: Dallas KATHEE Jude, MD;  Location: [facility];  Service: Thoracic;  Laterality: N/A;   VIDEO BRONCHOSCOPY Left 08/12/2021   Procedure: VIDEO BRONCHOSCOPY WITHOUT FLUORO;  Surgeon: Tamea Dedra CROME, MD;  Location: [facility];  Service: Cardiopulmonary;  Laterality: Left;   VIDEO BRONCHOSCOPY Left 09/09/2021   Procedure: VIDEO BRONCHOSCOPY WITHOUT FLUORO;  Surgeon: Tamea Dedra CROME, MD;  Location: [facility];  Service: Cardiopulmonary;  Laterality: Left;    FAMILY HISTORY: Family History  Problem Relation Age of Onset   Stroke Mother    Alzheimer's disease Mother    Hypertension Sister    Hyperlipidemia Sister    Hypertension Brother    Hyperlipidemia Brother    Diabetes Brother     ADVANCED DIRECTIVES (Y/N):  N  HEALTH MAINTENANCE: Social History   Tobacco Use   Smoking status: Former    Current packs/day: 0.00    Average packs/day: 1 pack/day for 40.0 years (40.0 ttl pk-yrs)    Types: Cigarettes    Start date: 12/09/1958    Quit date: 12/09/1998    Years since quitting: 25.4    Passive exposure: Never   Smokeless tobacco: Never  Vaping Use   Vaping status: Never Used  Substance Use Topics   Alcohol use: No   Drug use: No     Colonoscopy:  PAP:  Bone density:  Lipid panel:  Allergies  Allergen Reactions   Tape Itching    Surgical tapes     Current Outpatient Medications  Medication Sig Dispense Refill   amLODipine  (NORVASC ) 5 MG tablet Take 5 mg in the morning and take another 5 mg in the afternoon as needed if BP >150/100 90 tablet 1   aspirin  EC 81 MG tablet Take 81 mg by mouth daily.     b complex vitamins capsule Take 1 capsule by mouth daily.     cholecalciferol (VITAMIN D3) 25 MCG (1000 UNIT) tablet  Take 1,000 Units by mouth daily.     Cinnamon 500 MG capsule Take 500 mg by mouth at bedtime.     DM-APAP-CPM (CORICIDIN HBP PO) Take 2 tablets by mouth daily as needed (allergies/cold symptoms).     glucose blood (ONETOUCH VERIO) test strip USE 1 STRIP TO CHECK GLUCOSE ONCE DAILY 100 each 2  ipratropium (ATROVENT ) 0.03 % nasal spray USE 2 SPRAY(S) IN EACH NOSTRIL THREE TIMES DAILY 30 mL 1   losartan  (COZAAR ) 100 MG tablet Take 1 tablet (100 mg total) by mouth every morning. 90 tablet 1   Multiple Vitamins-Minerals (MULTIVITAMIN WITH MINERALS) tablet Take 1 tablet by mouth daily.     ondansetron  (ZOFRAN ) 8 MG tablet Take 1 tablet (8 mg total) by mouth 2 (two) times daily as needed for nausea or vomiting. 20 tablet 1   polycarbophil (FIBERCON) 625 MG tablet Take 625 mg by mouth 2 (two) times daily.     polyvinyl alcohol (LIQUIFILM TEARS) 1.4 % ophthalmic solution Place 1 drop into both eyes as needed for dry eyes.     rosuvastatin  (CRESTOR ) 20 MG tablet Take 1 tablet (20 mg total) by mouth daily. 90 tablet 1   vitamin C (ASCORBIC ACID ) 500 MG tablet Take 500 mg by mouth daily.     zinc  sulfate 220 (50 Zn) MG capsule Take 1 capsule (220 mg total) by mouth daily. 30 capsule 0   No current facility-administered medications for this visit.   Facility-Administered Medications Ordered in Other Visits  Medication Dose Route Frequency Provider Last Rate Last Admin   lanreotide acetate  (SOMATULINE DEPOT ) injection 120 mg  120 mg Subcutaneous Once Jacqualynn Parco J, MD        OBJECTIVE: Vitals:   05/31/24 1105 05/31/24 1113  BP: (!) 153/77 (!) 145/84  Pulse: (!) 58   Temp: 97.6 F (36.4 C)   SpO2: 98%      Body mass index is 26.61 kg/m.    ECOG FS:0 - Asymptomatic  General: Well-developed, well-nourished, no acute distress. Eyes: Pink conjunctiva, anicteric sclera. HEENT: Normocephalic, moist mucous membranes. Lungs: No audible wheezing or coughing. Heart: Regular rate and  rhythm. Abdomen: Soft, nontender, no obvious distention. Musculoskeletal: No edema, cyanosis, or clubbing. Neuro: Alert, answering all questions appropriately. Cranial nerves grossly intact. Skin: No rashes or petechiae noted. Psych: Normal affect.  LAB RESULTS:  Lab Results  Component Value Date   NA 141 05/31/2024   K 4.5 05/31/2024   CL 105 05/31/2024   CO2 26 05/31/2024   GLUCOSE 98 05/31/2024   BUN 23 05/31/2024   CREATININE 1.04 05/31/2024   CALCIUM  10.0 05/31/2024   PROT 7.0 05/31/2024   ALBUMIN  4.4 05/31/2024   AST 26 05/31/2024   ALT 23 05/31/2024   ALKPHOS 132 (H) 05/31/2024   BILITOT 0.7 05/31/2024   GFRNONAA >60 05/31/2024   GFRAA >60 01/29/2020    Lab Results  Component Value Date   WBC 8.3 05/31/2024   NEUTROABS 6.2 05/31/2024   HGB 16.2 05/31/2024   HCT 49.1 05/31/2024   MCV 92.5 05/31/2024   PLT 163 05/31/2024     STUDIES: MR Abdomen W Wo Contrast Result Date: 05/24/2024 CLINICAL DATA:  Metastatic neuroendocrine tumor, evaluate liver metastases EXAM: MRI ABDOMEN WITHOUT AND WITH CONTRAST TECHNIQUE: Multiplanar multisequence MR imaging of the abdomen was performed both before and after the administration of intravenous contrast. CONTRAST:  7mL GADAVIST  GADOBUTROL  1 MMOL/ML IV SOLN COMPARISON:  11/17/2023 FINDINGS: Lower chest: No acute abnormality. Hepatobiliary: Multiple (8-10) heterogeneously T2 hyperintense, hypoenhancing liver metastases are again seen, not significantly changed, several with overlying capsular retraction, index lesion in the liver dome measuring 3.1 x 2.5 cm (series 4, image 7). No new liver lesions. Cholecystectomy. Mild postoperative biliary ductal dilatation. Pancreas: Unremarkable. No pancreatic ductal dilatation or surrounding inflammatory changes. Spleen: Normal in size without significant abnormality. Adrenals/Urinary Tract: Adrenal  glands are unremarkable. Atrophic left kidney. Multiple small renal cortical cysts. Right kidney is  normal in size and kidneys are otherwise normal, without renal calculi, solid lesion, or hydronephrosis. Stomach/Bowel: Stomach is within normal limits. No evidence of bowel wall thickening, distention, or inflammatory changes. Descending and sigmoid diverticulosis. Vascular/Lymphatic: No significant vascular findings are present. No enlarged abdominal lymph nodes. Other: No abdominal wall hernia or abnormality. No ascites. Unchanged enhancing soft tissue nodules in the ventral left hemiabdomen measuring 1.1 cm (series 18, image 76) and in the right paracolic gutter measuring up to 1.0 cm (series 18, image 82, 57). Musculoskeletal: No acute osseous findings. Unchanged enhancing osseous and paraspinous soft tissue metastases, including of the right aspect of the T12 vertebral body (series 18, image 34) and at the right aspect of the L4 vertebral body (series 18, image 76). IMPRESSION: 1. Unchanged hypoenhancing liver metastases. 2. Unchanged enhancing peritoneal soft tissue nodules in the ventral and right hemiabdomen. 3. Unchanged osseous and paraspinous soft tissue metastases. 4. Atrophic left kidney.  No hydronephrosis. 5. Descending and sigmoid diverticulosis. Electronically Signed   By: Marolyn JONETTA Jaksch M.D.   On: 05/24/2024 06:58   CT CHEST W CONTRAST Result Date: 05/23/2024 CLINICAL DATA:  follow up metastatic neuroendocrine carcinoma. * Tracking Code: BO * EXAM: CT CHEST WITH CONTRAST TECHNIQUE: Multidetector CT imaging of the chest was performed during intravenous contrast administration. RADIATION DOSE REDUCTION: This exam was performed according to the departmental dose-optimization program which includes automated exposure control, adjustment of the mA and/or kV according to patient size and/or use of iterative reconstruction technique. CONTRAST:  75mL OMNIPAQUE  IOHEXOL  300 MG/ML  SOLN COMPARISON:  Nuclear medicine PET scan from 09/30/2022 and CT scan chest from 11/19/2021. FINDINGS: Cardiovascular:  Normal cardiac size. No pericardial effusion. No aortic aneurysm. However, note is made of slight ectasia of the mid to distal aortic arch, which is essentially similar to the prior study (series 2, image 45). There are coronary artery calcifications, in keeping with coronary artery disease. There are also mild to moderate peripheral atherosclerotic vascular calcifications of thoracic aorta and its major branches. Mediastinum/Nodes: Visualized thyroid gland appears grossly unremarkable. No solid / cystic mediastinal masses. The esophagus is nondistended precluding optimal assessment. There is mild circumferential thickening of the lower thoracic esophagus, which is most likely seen in the settings of chronic gastroesophageal reflux disease versus esophagitis. No axillary, mediastinal or hilar lymphadenopathy by size criteria. Lungs/Pleura: The trachea and central right bronchial tree is patent. However, there is a 1.2 x 1.2 cm solid, soft tissue attenuation mass arising from the anterior wall of the proximal left main bronchus, causing marked narrowing of the left bronchial tree (series 6, image 97). In retrospect, the mass is new since the prior study from 12/06/2021 however, there was very small nodule in this region on the prior PET-CT scan from 09/30/2022, which was not tracer avid. The mass is arising from the anterior wall with subtle extension into the surrounding mediastinal fat. No associated calcifications. No significant enhancement however, evaluation is limited due to lack of precontrast images. Findings are nonspecific and differential diagnosis is broad. Bronchoscopy and tissue sampling is recommended. Note is made of postsurgical changes from prior left upper lobectomy. There is compensatory emphysema in the left lung as well as mild-to-moderate upper lobe predominant emphysema in the right lung. There are peripheral/subpleural reticulations with areas of honeycombing and bronchiectasis  asymmetrically more involving the right lung, favoring underlying pulmonary fibrosis. No mass, consolidation, lung collapse or pleural effusion  on either side. No suspicious lung nodules. Upper Abdomen: *Redemonstration of partially exophytic well-circumscribed oval 2.6 x 3.3 cm hypoattenuating lesion arising from the right hepatic dome, segment 8, incompletely characterized on the current exam but present since multiple prior studies and without significant interval change. *There is a single ill-defined hypoattenuating lesion in the right hepatic lobe, segment 5 measuring approximately 2.4 x 2.5 cm, which can not be characterized as a simple cyst on the basis of this exam. This is indeterminate. Patient underwent MRI abdomen on the same day. Please refer to MRI abdomen report for details. *There is a subtle hypoattenuating lesion in the subcapsular left hepatic lobe (series 5, image 43), also better characterized on the MRI. *There is an irregular 1.6 x 2.0 cm soft tissue in the right epigastric region, which is highly concerning for omental carcinomatosis. There are several adjacent smaller nodules as well, which are also favored to represent metastases. *There is small sliding hiatal hernia. *There are scattered colonic diverticula without diverticulitis. *Metallic embolization coil noted along the GDA. *There is a partially exophytic simple cyst arising from the left kidney upper pole, posterolaterally. Remaining visualized upper abdominal viscera within normal limits. Musculoskeletal: The visualized soft tissues of the chest wall are grossly unremarkable. Mixed sclerotic/lytic, but predominantly sclerotic lesion noted in the lower portion of sternal body, lateral left eighth rib, as well as T1 and T11 vertebrae which has increased in size since the prior study from 11/19/2021 and favored to represent treated metastatic lesions. No pathological fracture. Old healed left rib fractures noted. There are mild to  moderate multilevel degenerative changes in the visualized spine. IMPRESSION: 1. There is a 1.2 x 1.2 cm solid, soft tissue attenuation mass arising from the anterior wall of the proximal left main bronchus, causing marked narrowing of the left bronchial tree. The mass is arising from the anterior wall with subtle extension into the surrounding mediastinal fat. No associated calcifications. No significant enhancement however, evaluation is limited due to lack of precontrast images. Findings are nonspecific and differential diagnosis is broad. Bronchoscopy and tissue sampling is recommended. 2. No suspicious lung nodule. No mediastinal or hilar lymphadenopathy. 3. Multiple other observations (including ill-defined liver lesions-suspicious, please refer to same-day acquired MRI abdomen, omental carcinomatosis, changes of pulmonary fibrosis, left renal cyst, colonic diverticulosis, mixed sclerotic/lytic osseous metastasis, etc.), As described above. Aortic Atherosclerosis (ICD10-I70.0) and Emphysema (ICD10-J43.9). Electronically Signed   By: Ree Molt M.D.   On: 05/23/2024 11:00     ONCOLOGY HISTORY: Patient's pathology, imaging, and outside facility notes reviewed extensively. Patient underwent left upper lobe lobectomy in [place] on June 04, 2013. Final pathology results revealed a well differentiated neuroendocrine tumor with positive bronchial margins. Patient did not receive adjuvant XRT or chemotherapy at that time.  His most recent Y 90 ablation to his liver occurred on April 28, 2018.   CT scan results from December 28, 2021  with 2 new foci noted in his liver likely consistent with progressive disease.  Patient subsequently underwent 4 treatments with Lutathera  completing on August 04, 2022.  Patient also underwent Y90 embolization on December 30, 2022.    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  PLAN:    Stage IVA neuroendocrine tumor of the lung  metastatic to the liver: See oncology history as above.  Patient's most recent MRI in May 23, 2024 was essentially unchanged from previous.  Proceed with lanreotide today.  Return to clinic in 1 and 2 months for  lanreotide only.  Patient to then return to clinic in 3 months with repeat laboratory, further evaluation, and continuation of treatment.  Repeat imaging in July 2026.   Left mainstem endobronchial mass: Consistent with recurrent neuroendocrine tumor.  Case discussed with pulmonology who will evaluate the patient tomorrow and consider bronchoscopy with cryoablation.   Hypertension: Chronic and unchanged.  Continue follow-up and treatment per primary care.  I spent a total of 30 minutes reviewing chart data, face-to-face evaluation with the patient, counseling and coordination of care as detailed above.   Patient expressed understanding and was in agreement with this plan. He also understands that He can call clinic at any time with any questions, concerns, or complaints.    Cancer Staging  Neuroendocrine carcinoma of lung [facility]) Staging form: Lung, AJCC 8th Edition - Clinical stage from 12/28/2016: Stage IVA (cT2a, cN0, cM1b) - Signed by [name], MD on 12/28/2016 Sites of metastasis: Liver   Evalene PARAS Jacobo, MD   05/31/2024 1:39 PM     "

## 2024-05-31 NOTE — Progress Notes (Signed)
 Patient states wanting to discuss results from 05/23/24 MRI and CT.

## 2024-06-01 ENCOUNTER — Telehealth: Payer: Self-pay

## 2024-06-01 ENCOUNTER — Ambulatory Visit: Admitting: Pulmonary Disease

## 2024-06-01 ENCOUNTER — Encounter: Payer: Self-pay | Admitting: Pulmonary Disease

## 2024-06-01 VITALS — BP 126/70 | HR 58 | Temp 97.9°F | Ht 64.0 in | Wt 155.2 lb

## 2024-06-01 DIAGNOSIS — Z87891 Personal history of nicotine dependence: Secondary | ICD-10-CM

## 2024-06-01 DIAGNOSIS — J9809 Other diseases of bronchus, not elsewhere classified: Secondary | ICD-10-CM

## 2024-06-01 DIAGNOSIS — C7A8 Other malignant neuroendocrine tumors: Secondary | ICD-10-CM | POA: Diagnosis not present

## 2024-06-01 DIAGNOSIS — R059 Cough, unspecified: Secondary | ICD-10-CM

## 2024-06-01 DIAGNOSIS — R0600 Dyspnea, unspecified: Secondary | ICD-10-CM

## 2024-06-01 LAB — CHROMOGRANIN A: Chromogranin A (ng/mL): 2233 ng/mL — ABNORMAL HIGH (ref 0.0–101.8)

## 2024-06-01 NOTE — Telephone Encounter (Signed)
 For the codes 68359, [id number], [phone] Prior Auth Not Required Refer # [id number]

## 2024-06-01 NOTE — Telephone Encounter (Signed)
 Noted. NFN

## 2024-06-01 NOTE — Telephone Encounter (Signed)
 Flexible Bronchoscopy w/ Excision Tumor 06/11/2024 8:00 am R91.1 CPT Code 68359, 31640  [place] please see Bronch info.

## 2024-06-01 NOTE — Patient Instructions (Signed)
 VISIT SUMMARY:  You were seen today for a recurrence of your carcinoid tumor in the left main stem bronchus. This tumor was initially treated in May 2021, but recent scans have shown it has returned. You are experiencing shortness of breath and coughing, particularly during activities involving your arms and hands.  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 The tumor is causing obstruction, leading to difficulty breathing and coughing. We have scheduled cryotherapy and resection of the tumor on February 2nd, 2026. Following this, you will have follow-up cryotherapy sessions every three to four weeks to address any remaining tumor tissue. We will continue to monitor for recurrence and manage your symptoms as needed.  INSTRUCTIONS:  Please attend your scheduled cryotherapy and resection appointment on February 2nd, 2026. Follow-up cryotherapy sessions will be every three to four weeks. Continue to monitor your symptoms and report any changes or worsening to us  immediately.

## 2024-06-01 NOTE — Progress Notes (Signed)
 "  Subjective:    Patient ID: Jason Malone, male    DOB: 07-23-1940, 84 y.o.   MRN: [id number]  Patient Care Team: [name], DO as PCP - General (Internal Medicine) [name], MD as PCP - Cardiology (Cardiology) [name], MD as Consulting Physician (Cardiology) [name], MD as Consulting Physician (Oncology)  Chief Complaint  Patient presents with   Lung Cancer    Dry cough, occasional phlegm.     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 I last saw him on 01 September 2023. Recall that he has had tumor debulking to the recurrent areas of carcinoid tumor in his left lung. This was last performed in June 2023 with spray cryotherapy debulking and balloon dilation.  He presents now with recurrence in the left mainstem bronchus.  HPI Discussed the use of AI scribe software for clinical note transcription with the patient, who gave verbal consent to proceed.  History of Present Illness   Jason Malone is an 84 year old male with a history of carcinoid tumor in the left main stem bronchus who presents for recurrence of the tumor. He is accompanied by his son.  He has a history of carcinoid tumor in the left main stem bronchus, with recurrence initially identified in May 2021 during a period when he had COVID-19. Initial treatment involved surgical resection in 2015, which was complicated by the tumor's location, requiring careful removal to preserve lung function. Subsequent treatments included spray cryotherapy and resection, with follow-ups showing no remaining tumor until recently.  In April 2023, a PET scan showed no remaining tumor, but a recent CT scan has revealed a recurrence in the same location. The current tumor appears to be a single mass, unlike the previous occurrence which involved two separate tumors. He  experiences shortness of breath, particularly when engaging in activities involving his arms and hands, and notes that he becomes 'short winded' and experiences coughing.  He has undergone multiple precarinal therapy and resection procedures since the initial diagnosis, with follow-ups every three to four weeks to monitor and manage any remnants.       Review of Systems A 10 point review of systems was performed and it is as noted above otherwise negative.   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   Respiratory failure (HCC) 06/11/2019   Elevated LFTs 06/11/2019   Pneumonia due to COVID-19 virus 06/10/2019   Contracture of palmar fascia 10/11/2018   Microalbuminuria due to type 2 diabetes mellitus (HCC) 04/12/2018   Type 2 diabetes mellitus (HCC) 04/10/2018   Osteopenia determined by x-ray 01/07/2017   Neuroendocrine carcinoma of lung (HCC) 12/28/2016   Low back pain 12/17/2016   Liver mass, left lobe 08/28/2016   Aortic atherosclerosis 04/06/2016   Coronary artery disease involving native heart without angina pectoris 04/06/2016   History of colonic polyps 10/28/2015   Medication monitoring encounter 08/15/2015  Paroxysmal atrial flutter (HCC)    Elevated serum alkaline phosphatase level 03/05/2015   Allergic rhinitis    BPH without urinary obstruction    Hyperlipidemia 02/13/2014   Orthostasis 07/18/2013   Sinus bradycardia 07/18/2013   S/P lobectomy of lung 06/04/2013   Atrial  flutter (HCC) 04/09/2013   Smoking history 04/09/2013   Essential hypertension 04/09/2013   Rectal bleeding 12/15/2012    Social History   Tobacco Use   Smoking status: Former    Current packs/day: 0.00    Average packs/day: 1 pack/day for 40.0 years (40.0 ttl pk-yrs)    Types: Cigarettes    Start date: 12/09/1958    Quit date: 12/09/1998    Years since quitting: 25.5    Passive exposure: Never   Smokeless tobacco: Never  Substance Use Topics   Alcohol use: No    Allergies[1]  Active Medications[2]  Immunization History  Administered Date(s) Administered   Fluad Quad(high Dose 65+) 01/24/2019, 01/23/2020, 01/30/2021, 02/01/2022   Fluad Trivalent(High Dose 65+) 02/07/2023   INFLUENZA, HIGH DOSE SEASONAL PF 02/12/2016, 02/14/2017, 01/17/2018, 02/06/2024   Influenza,inj,Quad PF,6+ Mos 02/14/2015   PFIZER(Purple Top)SARS-COV-2 Vaccination 05/24/2019, 10/02/2019, 04/14/2020   PNEUMOCOCCAL CONJUGATE-20 12/28/2021   Pneumococcal Conjugate-13 02/01/2014   Pneumococcal Polysaccharide-23 05/10/2008        Objective:     Vitals:   06/01/24 1131  BP: 126/70  Pulse: (!) 58  Temp: 97.9 F (36.6 C)  Height: 5' 4 (1.626 m)  Weight: 155 lb 3.2 oz (70.4 kg)  SpO2: 99%  TempSrc: Temporal  BMI (Calculated): 26.63     SpO2: 99 %  GENERAL: Awake and alert, well developed, well nourished gentleman, in no respiratory distress, fully ambulatory, no conversational dyspnea. HEAD: Normocephalic, atraumatic. EYES: Pupils equal, round, reactive to light.  No scleral icterus. MOUTH: Wears dentures.  No thrush. NECK: Supple. No thyromegaly. No nodules. No JVD.  Trachea is midline PULMONARY: Slightly diminished breath sounds on the left with a faint localized wheeze, no rhonchi noted, no crackles.  Right lung is clear. CARDIOVASCULAR: S1 and S2.  Regular rate and rhythm.No rubs murmurs or gallops appreciated. GASTROINTESTINAL: Benign. MUSCULOSKELETAL: No joint deformity, no clubbing, no  edema. NEUROLOGIC: No focal deficit, no gait disturbance, speech is fluent. SKIN: Intact,warm,dry. No rashes noted on limited exam. PSYCH: Mood and behavior normal.   Representative images from the CT performed 23 May 2024 showing recurrent tumor mass in the left mainstem bronchus (arrows):       Assessment & Plan:     ICD-10-CM   1. Neuroendocrine carcinoma of lung (HCC) -stage IVa  C7A.8 Procedural/ Surgical Case Request: BRONCHOSCOPY, FLEXIBLE    2. Bronchial obstruction  J98.09       Orders Placed This Encounter  Procedures   Procedural/ Surgical Case Request: BRONCHOSCOPY, FLEXIBLE    Standing Status:   Future    Expiration Date:   06/01/2025    Pre-op diagnosis:   Neuroendocrine tumor, recurrent, LEFT mainstem    Special needs:   Spray cryotherapy (TruFreeze), [employer]   Discussion:    Recurrent neuroendocrine carcinoma of the left main bronchus Recurrent neuroendocrine carcinoma in the left main bronchus with recurrence after previous successful treatment. The tumor is causing obstruction, leading to dyspnea and cough.  Initial surgical intervention in 2015 was extensive due to tumor location, and further surgery is not feasible. The tumor's location and recurrence pattern necessitate ongoing intervention.  Tumor has been requiring at the suture line from prior lobectomy.  Patient will need tumor  debulking and destruction. - Scheduled cryotherapy and resection of the tumor on February 2nd, 2026. - Will perform follow-up cryotherapy sessions every three to four weeks to address any remaining tumor tissue. - Will monitor for recurrence and manage symptoms as needed.     Follow-up in 2 to 3 weeks postprocedure to schedule follow-up treatments as necessary  Advised if symptoms do not improve or worsen, to please contact office for sooner follow up or seek emergency care.    I spent 42 minutes of dedicated to the care of this patient on the date of this encounter to  include pre-visit review of records, face-to-face time with the patient discussing conditions above, post visit ordering of testing, clinical documentation with the electronic health record, making appropriate referrals as documented, and communicating necessary findings to members of the patients care team.     C. Leita Sanders, MD Advanced Bronchoscopy PCCM  [facility]    *This note was generated using voice recognition software/Dragon and/or AI transcription program.  Despite best efforts to proofread, errors can occur which can change the meaning. Any transcriptional errors that result from this process are unintentional and may not be fully corrected at the time of dictation.     [1]  Allergies Allergen Reactions   Tape Itching    Surgical tapes   [2]  Current Meds  Medication Sig   amLODipine  (NORVASC ) 5 MG tablet Take 5 mg in the morning and take another 5 mg in the afternoon as needed if BP >150/100   aspirin  EC 81 MG tablet Take 81 mg by mouth daily.   b complex vitamins capsule Take 1 capsule by mouth daily.   cholecalciferol (VITAMIN D3) 25 MCG (1000 UNIT) tablet Take 1,000 Units by mouth daily.   Cinnamon 500 MG capsule Take 500 mg by mouth at bedtime.   DM-APAP-CPM (CORICIDIN HBP PO) Take 2 tablets by mouth daily as needed (allergies/cold symptoms).   glucose blood (ONETOUCH VERIO) test strip USE 1 STRIP TO CHECK GLUCOSE ONCE DAILY   ipratropium (ATROVENT ) 0.03 % nasal spray USE 2 SPRAY(S) IN EACH NOSTRIL THREE TIMES DAILY   losartan  (COZAAR ) 100 MG tablet Take 1 tablet (100 mg total) by mouth every morning.   Multiple Vitamins-Minerals (MULTIVITAMIN WITH MINERALS) tablet Take 1 tablet by mouth daily.   ondansetron  (ZOFRAN ) 8 MG tablet Take 1 tablet (8 mg total) by mouth 2 (two) times daily as needed for nausea or vomiting.   polycarbophil (FIBERCON) 625 MG tablet Take 625 mg by mouth 2 (two) times daily.   polyvinyl alcohol (LIQUIFILM TEARS) 1.4 %  ophthalmic solution Place 1 drop into both eyes as needed for dry eyes.   rosuvastatin  (CRESTOR ) 20 MG tablet Take 1 tablet (20 mg total) by mouth daily.   vitamin C (ASCORBIC ACID ) 500 MG tablet Take 500 mg by mouth daily.   zinc  sulfate 220 (50 Zn) MG capsule Take 1 capsule (220 mg total) by mouth daily.   "

## 2024-06-01 NOTE — H&P (View-Only) (Signed)
 "  Subjective:    Patient ID: Jason Malone, male    DOB: 07-23-1940, 84 y.o.   MRN: [id number]  Patient Care Team: [name], DO as PCP - General (Internal Medicine) [name], MD as PCP - Cardiology (Cardiology) [name], MD as Consulting Physician (Cardiology) [name], MD as Consulting Physician (Oncology)  Chief Complaint  Patient presents with   Lung Cancer    Dry cough, occasional phlegm.     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 I last saw him on 01 September 2023. Recall that he has had tumor debulking to the recurrent areas of carcinoid tumor in his left lung. This was last performed in June 2023 with spray cryotherapy debulking and balloon dilation.  He presents now with recurrence in the left mainstem bronchus.  HPI Discussed the use of AI scribe software for clinical note transcription with the patient, who gave verbal consent to proceed.  History of Present Illness   Jason Malone is an 84 year old male with a history of carcinoid tumor in the left main stem bronchus who presents for recurrence of the tumor. He is accompanied by his son.  He has a history of carcinoid tumor in the left main stem bronchus, with recurrence initially identified in May 2021 during a period when he had COVID-19. Initial treatment involved surgical resection in 2015, which was complicated by the tumor's location, requiring careful removal to preserve lung function. Subsequent treatments included spray cryotherapy and resection, with follow-ups showing no remaining tumor until recently.  In April 2023, a PET scan showed no remaining tumor, but a recent CT scan has revealed a recurrence in the same location. The current tumor appears to be a single mass, unlike the previous occurrence which involved two separate tumors. He  experiences shortness of breath, particularly when engaging in activities involving his arms and hands, and notes that he becomes 'short winded' and experiences coughing.  He has undergone multiple precarinal therapy and resection procedures since the initial diagnosis, with follow-ups every three to four weeks to monitor and manage any remnants.       Review of Systems A 10 point review of systems was performed and it is as noted above otherwise negative.   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   Respiratory failure (HCC) 06/11/2019   Elevated LFTs 06/11/2019   Pneumonia due to COVID-19 virus 06/10/2019   Contracture of palmar fascia 10/11/2018   Microalbuminuria due to type 2 diabetes mellitus (HCC) 04/12/2018   Type 2 diabetes mellitus (HCC) 04/10/2018   Osteopenia determined by x-ray 01/07/2017   Neuroendocrine carcinoma of lung (HCC) 12/28/2016   Low back pain 12/17/2016   Liver mass, left lobe 08/28/2016   Aortic atherosclerosis 04/06/2016   Coronary artery disease involving native heart without angina pectoris 04/06/2016   History of colonic polyps 10/28/2015   Medication monitoring encounter 08/15/2015  Paroxysmal atrial flutter (HCC)    Elevated serum alkaline phosphatase level 03/05/2015   Allergic rhinitis    BPH without urinary obstruction    Hyperlipidemia 02/13/2014   Orthostasis 07/18/2013   Sinus bradycardia 07/18/2013   S/P lobectomy of lung 06/04/2013   Atrial  flutter (HCC) 04/09/2013   Smoking history 04/09/2013   Essential hypertension 04/09/2013   Rectal bleeding 12/15/2012    Social History   Tobacco Use   Smoking status: Former    Current packs/day: 0.00    Average packs/day: 1 pack/day for 40.0 years (40.0 ttl pk-yrs)    Types: Cigarettes    Start date: 12/09/1958    Quit date: 12/09/1998    Years since quitting: 25.5    Passive exposure: Never   Smokeless tobacco: Never  Substance Use Topics   Alcohol use: No    Allergies[1]  Active Medications[2]  Immunization History  Administered Date(s) Administered   Fluad Quad(high Dose 65+) 01/24/2019, 01/23/2020, 01/30/2021, 02/01/2022   Fluad Trivalent(High Dose 65+) 02/07/2023   INFLUENZA, HIGH DOSE SEASONAL PF 02/12/2016, 02/14/2017, 01/17/2018, 02/06/2024   Influenza,inj,Quad PF,6+ Mos 02/14/2015   PFIZER(Purple Top)SARS-COV-2 Vaccination 05/24/2019, 10/02/2019, 04/14/2020   PNEUMOCOCCAL CONJUGATE-20 12/28/2021   Pneumococcal Conjugate-13 02/01/2014   Pneumococcal Polysaccharide-23 05/10/2008        Objective:     Vitals:   06/01/24 1131  BP: 126/70  Pulse: (!) 58  Temp: 97.9 F (36.6 C)  Height: 5' 4 (1.626 m)  Weight: 155 lb 3.2 oz (70.4 kg)  SpO2: 99%  TempSrc: Temporal  BMI (Calculated): 26.63     SpO2: 99 %  GENERAL: Awake and alert, well developed, well nourished gentleman, in no respiratory distress, fully ambulatory, no conversational dyspnea. HEAD: Normocephalic, atraumatic. EYES: Pupils equal, round, reactive to light.  No scleral icterus. MOUTH: Wears dentures.  No thrush. NECK: Supple. No thyromegaly. No nodules. No JVD.  Trachea is midline PULMONARY: Slightly diminished breath sounds on the left with a faint localized wheeze, no rhonchi noted, no crackles.  Right lung is clear. CARDIOVASCULAR: S1 and S2.  Regular rate and rhythm.No rubs murmurs or gallops appreciated. GASTROINTESTINAL: Benign. MUSCULOSKELETAL: No joint deformity, no clubbing, no  edema. NEUROLOGIC: No focal deficit, no gait disturbance, speech is fluent. SKIN: Intact,warm,dry. No rashes noted on limited exam. PSYCH: Mood and behavior normal.   Representative images from the CT performed 23 May 2024 showing recurrent tumor mass in the left mainstem bronchus (arrows):       Assessment & Plan:     ICD-10-CM   1. Neuroendocrine carcinoma of lung (HCC) -stage IVa  C7A.8 Procedural/ Surgical Case Request: BRONCHOSCOPY, FLEXIBLE    2. Bronchial obstruction  J98.09       Orders Placed This Encounter  Procedures   Procedural/ Surgical Case Request: BRONCHOSCOPY, FLEXIBLE    Standing Status:   Future    Expiration Date:   06/01/2025    Pre-op diagnosis:   Neuroendocrine tumor, recurrent, LEFT mainstem    Special needs:   Spray cryotherapy (TruFreeze), [employer]   Discussion:    Recurrent neuroendocrine carcinoma of the left main bronchus Recurrent neuroendocrine carcinoma in the left main bronchus with recurrence after previous successful treatment. The tumor is causing obstruction, leading to dyspnea and cough.  Initial surgical intervention in 2015 was extensive due to tumor location, and further surgery is not feasible. The tumor's location and recurrence pattern necessitate ongoing intervention.  Tumor has been requiring at the suture line from prior lobectomy.  Patient will need tumor  debulking and destruction. - Scheduled cryotherapy and resection of the tumor on February 2nd, 2026. - Will perform follow-up cryotherapy sessions every three to four weeks to address any remaining tumor tissue. - Will monitor for recurrence and manage symptoms as needed.     Follow-up in 2 to 3 weeks postprocedure to schedule follow-up treatments as necessary  Advised if symptoms do not improve or worsen, to please contact office for sooner follow up or seek emergency care.    I spent 42 minutes of dedicated to the care of this patient on the date of this encounter to  include pre-visit review of records, face-to-face time with the patient discussing conditions above, post visit ordering of testing, clinical documentation with the electronic health record, making appropriate referrals as documented, and communicating necessary findings to members of the patients care team.     C. Leita Sanders, MD Advanced Bronchoscopy PCCM  [facility]    *This note was generated using voice recognition software/Dragon and/or AI transcription program.  Despite best efforts to proofread, errors can occur which can change the meaning. Any transcriptional errors that result from this process are unintentional and may not be fully corrected at the time of dictation.     [1]  Allergies Allergen Reactions   Tape Itching    Surgical tapes   [2]  Current Meds  Medication Sig   amLODipine  (NORVASC ) 5 MG tablet Take 5 mg in the morning and take another 5 mg in the afternoon as needed if BP >150/100   aspirin  EC 81 MG tablet Take 81 mg by mouth daily.   b complex vitamins capsule Take 1 capsule by mouth daily.   cholecalciferol (VITAMIN D3) 25 MCG (1000 UNIT) tablet Take 1,000 Units by mouth daily.   Cinnamon 500 MG capsule Take 500 mg by mouth at bedtime.   DM-APAP-CPM (CORICIDIN HBP PO) Take 2 tablets by mouth daily as needed (allergies/cold symptoms).   glucose blood (ONETOUCH VERIO) test strip USE 1 STRIP TO CHECK GLUCOSE ONCE DAILY   ipratropium (ATROVENT ) 0.03 % nasal spray USE 2 SPRAY(S) IN EACH NOSTRIL THREE TIMES DAILY   losartan  (COZAAR ) 100 MG tablet Take 1 tablet (100 mg total) by mouth every morning.   Multiple Vitamins-Minerals (MULTIVITAMIN WITH MINERALS) tablet Take 1 tablet by mouth daily.   ondansetron  (ZOFRAN ) 8 MG tablet Take 1 tablet (8 mg total) by mouth 2 (two) times daily as needed for nausea or vomiting.   polycarbophil (FIBERCON) 625 MG tablet Take 625 mg by mouth 2 (two) times daily.   polyvinyl alcohol (LIQUIFILM TEARS) 1.4 %  ophthalmic solution Place 1 drop into both eyes as needed for dry eyes.   rosuvastatin  (CRESTOR ) 20 MG tablet Take 1 tablet (20 mg total) by mouth daily.   vitamin C (ASCORBIC ACID ) 500 MG tablet Take 500 mg by mouth daily.   zinc  sulfate 220 (50 Zn) MG capsule Take 1 capsule (220 mg total) by mouth daily.   "

## 2024-06-08 ENCOUNTER — Encounter
Admission: RE | Admit: 2024-06-08 | Discharge: 2024-06-08 | Disposition: A | Source: Ambulatory Visit | Attending: Pulmonary Disease

## 2024-06-08 ENCOUNTER — Other Ambulatory Visit: Payer: Self-pay

## 2024-06-08 DIAGNOSIS — I700 Atherosclerosis of aorta: Secondary | ICD-10-CM | POA: Insufficient documentation

## 2024-06-08 DIAGNOSIS — J449 Chronic obstructive pulmonary disease, unspecified: Secondary | ICD-10-CM | POA: Insufficient documentation

## 2024-06-08 DIAGNOSIS — I2510 Atherosclerotic heart disease of native coronary artery without angina pectoris: Secondary | ICD-10-CM | POA: Insufficient documentation

## 2024-06-08 DIAGNOSIS — Z01810 Encounter for preprocedural cardiovascular examination: Secondary | ICD-10-CM | POA: Insufficient documentation

## 2024-06-08 DIAGNOSIS — R9431 Abnormal electrocardiogram [ECG] [EKG]: Secondary | ICD-10-CM | POA: Diagnosis not present

## 2024-06-08 DIAGNOSIS — E118 Type 2 diabetes mellitus with unspecified complications: Secondary | ICD-10-CM

## 2024-06-08 HISTORY — DX: Unspecified atrial fibrillation: I48.91

## 2024-06-08 HISTORY — DX: Other benign neuroendocrine tumors: D3A.8

## 2024-06-08 NOTE — Patient Instructions (Addendum)
 Your procedure is scheduled on: Feb 06/2024   Report to the Registration Desk on the 1st floor of the [facility]. To find out your arrival time, please call [phone] between 1PM - 3PM on: Jan 30/2026  If your arrival time is 6:00 am, do not arrive before that time as the [facility] entrance doors do not open until 6:00 am.  REMEMBER: Instructions that are not followed completely may result in serious medical risk, up to and including death; or upon the discretion of your surgeon and anesthesiologist your surgery may need to be rescheduled.  Do not eat food or drink anything after midnight the night before surgery.  No gum chewing or hard candies.  One week prior to surgery: Stop Anti-inflammatories (NSAIDS) such as Advil, Aleve, Ibuprofen, Motrin, Naproxen, Naprosyn and Aspirin  based products such as Excedrin, Goody's Powder, BC Powder. Stop ANY OVER THE COUNTER supplements until after surgery.  You may however, continue to take Tylenol  if needed for pain up until the day of surgery.  Do not take aspirin  on am of surgery.   Continue taking all of your other prescription medications up until the day of surgery.  ON THE DAY OF SURGERY ONLY TAKE THESE MEDICATIONS WITH SIPS OF WATER:  amLODipine  (NORVASC )   On the morning of surgery brush your teeth with toothpaste and water, you may rinse your mouth with mouthwash if you wish. Do not swallow any toothpaste or mouthwash.  You may shower on day of surgery.  Do not wear jewelry, make-up, hairpins, clips or nail polish.  Do not wear lotions, powders, or perfumes.   Do not shave body hair from the neck down 48 hours before surgery.  Contact lenses, hearing aids and dentures may not be worn into surgery.  Do not bring valuables to the hospital. [facility] is not responsible for any missing/lost belongings or valuables.    Notify your doctor if there is any change in your medical condition (cold, fever, infection).  Wear  comfortable clothing (specific to your surgery type) to the hospital.  After surgery, you can help prevent lung complications by doing breathing exercises.  Take deep breaths and cough every 1-2 hours. Your doctor may order a device called an Incentive Spirometer to help you take deep breaths.    If you are being discharged the day of surgery, you will not be allowed to drive home. You will need a responsible individual to drive you home and stay with you for 24 hours after surgery.     Please call the Pre-admissions Testing Dept. at [phone] if you have any questions about these instructions.  Surgery Visitation Policy:  Patients having surgery or a procedure may have two visitors.  Children under the age of 61 must have an adult with them who is not the patient.   Merchandiser, Retail to address health-related social needs:  [email or web address]

## 2024-06-10 MED ORDER — SODIUM CHLORIDE 0.9 % IV SOLN: INTRAVENOUS | Status: DC

## 2024-06-10 MED ORDER — CHLORHEXIDINE GLUCONATE 0.12 % MT SOLN
15.0000 mL | Freq: Once | OROMUCOSAL | Status: AC
  Administered 2024-06-11: 15 mL via OROMUCOSAL

## 2024-06-10 MED ORDER — SODIUM CHLORIDE 0.9 % IV SOLN: Freq: Once | INTRAVENOUS | Status: DC

## 2024-06-10 MED ORDER — ORAL CARE MOUTH RINSE: 15.0000 mL | Freq: Once | OROMUCOSAL | Status: AC

## 2024-06-11 ENCOUNTER — Ambulatory Visit: Payer: Self-pay | Admitting: Urgent Care

## 2024-06-11 ENCOUNTER — Encounter
Admission: RE | Disposition: A | Discharge status: Home / Self Care | Payer: Self-pay | Source: Home / Self Care | Attending: Pulmonary Disease

## 2024-06-11 ENCOUNTER — Ambulatory Visit
Admission: RE | Admit: 2024-06-11 | Discharge: 2024-06-11 | Disposition: A | Attending: Pulmonary Disease | Admitting: Pulmonary Disease

## 2024-06-11 ENCOUNTER — Other Ambulatory Visit: Payer: Self-pay

## 2024-06-11 ENCOUNTER — Encounter: Admitting: Urgent Care

## 2024-06-11 ENCOUNTER — Ambulatory Visit

## 2024-06-11 ENCOUNTER — Encounter: Payer: Self-pay | Admitting: Pulmonary Disease

## 2024-06-11 DIAGNOSIS — I10 Essential (primary) hypertension: Secondary | ICD-10-CM | POA: Insufficient documentation

## 2024-06-11 DIAGNOSIS — J9809 Other diseases of bronchus, not elsewhere classified: Secondary | ICD-10-CM | POA: Insufficient documentation

## 2024-06-11 DIAGNOSIS — I2510 Atherosclerotic heart disease of native coronary artery without angina pectoris: Secondary | ICD-10-CM | POA: Insufficient documentation

## 2024-06-11 DIAGNOSIS — C7A8 Other malignant neuroendocrine tumors: Secondary | ICD-10-CM | POA: Insufficient documentation

## 2024-06-11 DIAGNOSIS — E118 Type 2 diabetes mellitus with unspecified complications: Secondary | ICD-10-CM

## 2024-06-11 DIAGNOSIS — J449 Chronic obstructive pulmonary disease, unspecified: Secondary | ICD-10-CM | POA: Insufficient documentation

## 2024-06-11 DIAGNOSIS — E119 Type 2 diabetes mellitus without complications: Secondary | ICD-10-CM | POA: Insufficient documentation

## 2024-06-11 DIAGNOSIS — Z87891 Personal history of nicotine dependence: Secondary | ICD-10-CM | POA: Insufficient documentation

## 2024-06-11 MED ORDER — ONDANSETRON HCL 4 MG/2ML IJ SOLN
INTRAMUSCULAR | Status: DC | PRN
  Administered 2024-06-11: 4 mg via INTRAVENOUS

## 2024-06-11 MED ORDER — FENTANYL CITRATE (PF) 100 MCG/2ML IJ SOLN
INTRAMUSCULAR | Status: AC
  Filled 2024-06-11: qty 2

## 2024-06-11 MED ORDER — PROPOFOL 10 MG/ML IV BOLUS
INTRAVENOUS | Status: DC | PRN
  Administered 2024-06-11: 80 ug/kg/min via INTRAVENOUS
  Administered 2024-06-11: 140 mg via INTRAVENOUS

## 2024-06-11 MED ORDER — LIDOCAINE HCL (CARDIAC) PF 100 MG/5ML IV SOSY
PREFILLED_SYRINGE | INTRAVENOUS | Status: DC | PRN
  Administered 2024-06-11: 80 mg via INTRAVENOUS

## 2024-06-11 MED ORDER — EPHEDRINE SULFATE-NACL 50-0.9 MG/10ML-% IV SOSY
PREFILLED_SYRINGE | INTRAVENOUS | Status: DC | PRN
  Administered 2024-06-11 (×2): 5 mg via INTRAVENOUS

## 2024-06-11 MED ORDER — LIDOCAINE HCL (PF) 2 % IJ SOLN
INTRAMUSCULAR | Status: AC
  Filled 2024-06-11: qty 5

## 2024-06-11 MED ORDER — ONDANSETRON HCL 4 MG/2ML IJ SOLN
INTRAMUSCULAR | Status: AC
  Filled 2024-06-11: qty 2

## 2024-06-11 MED ORDER — DEXAMETHASONE SOD PHOSPHATE PF 10 MG/ML IJ SOLN
INTRAMUSCULAR | Status: AC
  Filled 2024-06-11: qty 1

## 2024-06-11 MED ORDER — ROCURONIUM BROMIDE 10 MG/ML (PF) SYRINGE
PREFILLED_SYRINGE | INTRAVENOUS | Status: AC
  Filled 2024-06-11: qty 10

## 2024-06-11 MED ORDER — FENTANYL CITRATE (PF) 100 MCG/2ML IJ SOLN
INTRAMUSCULAR | Status: DC | PRN
  Administered 2024-06-11: 50 ug via INTRAVENOUS

## 2024-06-11 MED ORDER — PROPOFOL 1000 MG/100ML IV EMUL
INTRAVENOUS | Status: AC
  Filled 2024-06-11: qty 100

## 2024-06-11 MED ORDER — CHLORHEXIDINE GLUCONATE 0.12 % MT SOLN
OROMUCOSAL | Status: AC
  Filled 2024-06-11: qty 15

## 2024-06-11 MED ORDER — DEXAMETHASONE SOD PHOSPHATE PF 10 MG/ML IJ SOLN
INTRAMUSCULAR | Status: DC | PRN
  Administered 2024-06-11: 10 mg via INTRAVENOUS

## 2024-06-11 MED ORDER — ROCURONIUM BROMIDE 100 MG/10ML IV SOLN
INTRAVENOUS | Status: DC | PRN
  Administered 2024-06-11: 50 mg via INTRAVENOUS

## 2024-06-11 MED ORDER — PHENYLEPHRINE HCL-NACL 20-0.9 MG/250ML-% IV SOLN
INTRAVENOUS | Status: AC
  Filled 2024-06-11: qty 250

## 2024-06-11 MED ORDER — SUGAMMADEX SODIUM 200 MG/2ML IV SOLN
INTRAVENOUS | Status: DC | PRN
  Administered 2024-06-11: 200 mg via INTRAVENOUS
  Administered 2024-06-11: 100 mg via INTRAVENOUS

## 2024-06-11 NOTE — Discharge Instructions (Signed)
 Video Bronchoscopy, Care After (includes Robotic and EBUS procedures) This sheet gives you information about how to care for yourself after your test. Your doctor may also give you more specific instructions. If you have problems or questions, contact your doctor. Follow these instructions at home: Eating and drinking When you are wide awake, your numbness is gone and your cough and gag reflexes have come back, you may: Start eating only soft foods. Slowly drink liquids. Six hours after the test, go back to your normal diet. Driving Do not drive for 24 hours if you were given a medicine to help you relax (sedative). Do not drive or use heavy machinery while taking prescription pain medicine. General instructions Take over-the-counter and prescription medicines only as told by your doctor. Return to your normal activities as told. Ask what activities are safe for you. Do not use any products that have nicotine or tobacco in them. This includes cigarettes and e-cigarettes. If you need help quitting, ask your doctor. Keep all follow-up visits as told by your doctor. This is important. It is very important if you had a tissue sample (biopsy) taken. Get help right away if: You have shortness of breath that gets worse. You get light-headed. You feel like you are going to pass out (faint). You have chest pain. You cough up: More than a little blood (i.e more than a tablespoon). More blood than before. Summary Do not use cigarettes. Do not use e-cigarettes. Seek care in the Emergency Department right away if you have chest pain or shortness of breath. Call or MyChart Message our office for any questions or problems at [phone].    This information is not intended to replace advice given to you by your health care provider. Make sure you discuss any questions you have with your health care provider.

## 2024-06-11 NOTE — Progress Notes (Signed)
 Per Dr. Tamea patient is to restart aspirin  tomorrow, will notify patient and family member, handwritten on AVS in medication list.

## 2024-06-11 NOTE — Anesthesia Postprocedure Evaluation (Signed)
"   Anesthesia Post Note  Patient: Jason Malone  Procedure(s) Performed: BRONCHOSCOPY, FLEXIBLE (Left)  Patient location during evaluation: PACU Anesthesia Type: General Level of consciousness: awake Pain management: satisfactory to patient Vital Signs Assessment: post-procedure vital signs reviewed and stable Respiratory status: spontaneous breathing Cardiovascular status: stable Anesthetic complications: no   No notable events documented.   Last Vitals:  Vitals:   06/11/24 0930 06/11/24 0939  BP: 136/62 (!) 152/63  Pulse: 79 72  Resp: 19 14  Temp:    SpO2: 98% 95%    Last Pain:  Vitals:   06/11/24 0939  TempSrc:   PainSc: 0-No pain                 VAN STAVEREN,Jah Alarid      "

## 2024-06-11 NOTE — Interval H&P Note (Signed)
 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  The various methods of treatment have been discussed with the patient and family. After consideration of risks, benefits and other options for treatment, the patient has consented to  Procedure(s): BRONCHOSCOPY WITH TUMOR DEBULKING AND TUMOR DESTRUCTION-LEFT as a surgical intervention.  The patient's history has been reviewed, patient examined, no change in status, stable for surgery.  I have reviewed the patient's chart and labs.  Questions were answered to the patient's satisfaction.  Benefits, limitations and potential complications of the procedure were discussed with the patient/family.  Complications from bronchoscopy are rare and most often minor, but if they occur they may include breathing difficulty, vocal cord spasm, hoarseness, slight fever, vomiting, dizziness, bronchospasm, infection, low blood oxygen, bleeding from biopsy site, or an allergic reaction to medications.  It is uncommon for patients to experience other more serious complications for example: Collapsed lung requiring chest tube placement, respiratory failure, heart attack and/or cardiac arrhythmia.  Patient agrees to proceed.  KYM Leita Sanders, MD Advanced Bronchoscopy PCCM [facility]    *This note was generated using voice recognition software/Dragon and/or AI transcription program.  Despite best efforts to proofread, errors can occur which can change the meaning. Any transcriptional errors that result from this process are unintentional and may not be fully corrected at the time of dictation.

## 2024-06-12 ENCOUNTER — Encounter: Payer: Self-pay | Admitting: Pulmonary Disease

## 2024-06-13 ENCOUNTER — Ambulatory Visit: Admitting: Internal Medicine

## 2024-06-13 ENCOUNTER — Encounter: Payer: Self-pay | Admitting: Internal Medicine

## 2024-06-13 ENCOUNTER — Other Ambulatory Visit: Payer: Self-pay

## 2024-06-13 VITALS — BP 128/76 | HR 60 | Temp 97.9°F | Resp 16 | Ht 64.0 in | Wt 155.8 lb

## 2024-06-13 DIAGNOSIS — E782 Mixed hyperlipidemia: Secondary | ICD-10-CM

## 2024-06-13 DIAGNOSIS — I10 Essential (primary) hypertension: Secondary | ICD-10-CM

## 2024-06-13 DIAGNOSIS — E1165 Type 2 diabetes mellitus with hyperglycemia: Secondary | ICD-10-CM

## 2024-06-13 LAB — POCT GLYCOSYLATED HEMOGLOBIN (HGB A1C): Hemoglobin A1C: 6.7 % — AB (ref 4.0–5.6)

## 2024-06-13 LAB — MICROALBUMIN / CREATININE URINE RATIO
Creatinine, Urine: 99 mg/dL (ref 20–320)
Microalb Creat Ratio: 47 mg/g{creat} — ABNORMAL HIGH
Microalb, Ur: 4.7 mg/dL

## 2024-06-13 LAB — SURGICAL PATHOLOGY

## 2024-06-13 MED ORDER — AMLODIPINE BESYLATE 5 MG PO TABS: ORAL_TABLET | ORAL | 1 refills | Status: AC

## 2024-06-13 MED ORDER — LOSARTAN POTASSIUM 100 MG PO TABS: 100.0000 mg | ORAL_TABLET | ORAL | 1 refills | Status: AC

## 2024-06-13 MED ORDER — ROSUVASTATIN CALCIUM 20 MG PO TABS: 20.0000 mg | ORAL_TABLET | Freq: Every day | ORAL | 1 refills | Status: AC

## 2024-06-13 NOTE — Progress Notes (Signed)
 "  Established Patient Office Visit  Subjective   Patient ID: Jason Malone, male    DOB: Oct 20, 1940  Age: 84 y.o. MRN: [id number]  Chief Complaint  Patient presents with   Medical Management of Chronic Issues    6 month recheck    HPI  Jason Malone presents for follow up on chronic medical conditions.  Discussed the use of AI scribe software for clinical note transcription with the patient, who gave verbal consent to proceed.  History of Present Illness  Jason Malone is an 84 year old male with diabetes who presents for a follow-up visit.  He is here for diabetes follow-up. His blood sugar has been stable by home checks. His last A1c was 7.4, up from 6.7, and he attributes this to eating peanut butter fudge over the holidays. He is not on diabetes medication. He has not taken amlodipine  for about 2 to 3 months and previously used it only around the time of his cancer shot.  He is taking Crestor  and losartan , both due for refill. He uses [facility] in [place] for prescriptions.  He recently had a bronchial tube procedure to remove a blockage and improve breathing, which is done every 2 to 3 years. His breathing improved, but he still has some cough and chest soreness.  He enjoys making sweets such as peanut butter fudge and chocolate pudding, which are family favorites and have contributed to his recent dietary lapse affecting blood sugar.   Hypertension/A.Flutter: -Medications: Losartan  100 mg, now taking Amlodipine  5 mg after his injection and a few days afterwards  -Patient is compliant with above medications and reports no side effects. -Denies any SOB, CP, vision changes or symptoms of hypotension.  HLD: -Medications: Crestor  20 mg -Patient is compliant with above medications and reports no side effects.  -Last lipid panel: Lipid Panel     Component Value Date/Time   CHOL 110 06/20/2023 1140   CHOL 133 02/14/2017 1046   TRIG 136 06/20/2023 1140   HDL 40 06/20/2023 1140    HDL 39 (L) 02/14/2017 1046   CHOLHDL 2.8 06/20/2023 1140   VLDL 9 06/15/2019 0114   LDLCALC 48 06/20/2023 1140   LABVLDL 24 02/14/2017 1046    Diabetes, Type 2: -Last A1c 7.4% 2/25 -Medications: Nothing currently - started on Metformin  previously but could not tolerate -Eye exam: Due, will call to schedule -Foot exam: Due today -Microalbumin: Due today -Statin: yes -PNA vaccine: yes -Denies symptoms of hypoglycemia, polyuria, polydipsia, numbness extremities, foot ulcers/trauma.   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: -Following with Pulmonology, IR and Oncology, last seen 11/21/23 -Now stage IVa, metastatic to liver  -Most MRI 7/25 without new suspicious hepatic lesions, plan to repeat imaging in January 2026. -Currently on Lanreotide  -Just had a bronchoscopy earlier this week  Health Maintenance: -Blood work UTD -Discussed RSV vaccine, patient declines   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   Respiratory failure (HCC) 06/11/2019   Elevated LFTs 06/11/2019   Pneumonia due to COVID-19 virus 06/10/2019  Contracture of palmar fascia 10/11/2018   Microalbuminuria due to type 2 diabetes mellitus (HCC) 04/12/2018   Type 2 diabetes mellitus (HCC) 04/10/2018   Osteopenia determined by x-ray 01/07/2017   Neuroendocrine carcinoma of lung (HCC) 12/28/2016   Low back pain 12/17/2016   Liver mass, left lobe 08/28/2016   Aortic atherosclerosis 04/06/2016    Coronary artery disease involving native heart without angina pectoris 04/06/2016   History of colonic polyps 10/28/2015   Medication monitoring encounter 08/15/2015   Paroxysmal atrial flutter (HCC)    Elevated serum alkaline phosphatase level 03/05/2015   Allergic rhinitis    BPH without urinary obstruction    Hyperlipidemia 02/13/2014   Orthostasis 07/18/2013   Sinus bradycardia 07/18/2013   S/P lobectomy of lung 06/04/2013   Atrial flutter (HCC) 04/09/2013   Smoking history 04/09/2013   Essential hypertension 04/09/2013   Rectal bleeding 12/15/2012   Past Medical History:  Diagnosis Date   Allergic rhinitis    Aortic atherosclerosis    Atrial fibrillation (HCC)    Benign prostatic hypertrophy with lower urinary tract symptoms (LUTS)    Bradycardia    Chest pain    a. 05/2013 MV: EF 70%, no ischemia.   Chronic respiratory failure with hypoxia (HCC)    COPD (chronic obstructive pulmonary disease) (HCC)    Coronary artery disease    COVID-19    Diabetes mellitus without complication (HCC)    last a1c 6.5 09-16-21 no meds   Diverticulitis    DIVERTICULOSIS   Dysrhythmia    Elevated LFTs    Enlarged prostate    Essential hypertension    CONTROLLED ON MEDS   Full dentures    H/O hypokalemia    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 08/28/2016   Hyperlipemia    Hypomagnesemia    IFG (impaired fasting glucose)    Liver cancer (HCC) 12/08/2016   Liver mass, left lobe 08/28/2016   Probably hemangioma; will get MRI of liver, refer to GI   Lung cancer (HCC)    a. carcinoid, left lung, Stage 1b (T2a, N0, cM0);  b. 05/2013 s/p VATS & LULobectomy.   Metabolic acidosis    Monocytosis    Neuroendocrine tumor (HCC)    Osteopenia    Paroxysmal atrial flutter (HCC)    a. 03/2013->no recurrence;  b. CHA2DS2VASc = 2-->not currently on anticoagulation;  c. 05/2012 Echo: EF 55-60%, normal RV.   Pneumonia    Postinflammatory pulmonary fibrosis (HCC)    Rectal bleeding     Smoking history    Wears glasses    Past Surgical History:  Procedure Laterality Date   BRONCHOSCOPY     04/06/2013   CARDIOVASCULAR STRESS TEST  05/2013   a. No evidence of ischemia or infarct, EF 70%, no WMAs   CATARACT EXTRACTION W/PHACO Left 07/14/2015   Procedure: CATARACT EXTRACTION PHACO AND INTRAOCULAR LENS PLACEMENT (IOC);  Surgeon: Dene Etienne, MD;  Location: [facility];  Service: Ophthalmology;  Laterality: Left;   CATARACT EXTRACTION W/PHACO Right 10/01/2015   Procedure: CATARACT EXTRACTION PHACO AND INTRAOCULAR LENS PLACEMENT (IOC);  Surgeon: Dene Etienne, MD;  Location: [facility];  Service: Ophthalmology;  Laterality: Right;   COLONOSCOPY W/ POLYPECTOMY     bleed after-had to go to surgery to stop bleeding via colonoscopy   COLONOSCOPY WITH PROPOFOL  N/A 11/19/2015   Procedure: COLONOSCOPY WITH PROPOFOL ;  Surgeon: Reyes LELON Cota, MD;  Location: [facility];  Service: Endoscopy;  Laterality: N/A;   DUPUYTREN CONTRACTURE RELEASE  12/15/2011   Procedure: DUPUYTREN CONTRACTURE RELEASE;  Surgeon: Arley JONELLE Curia, MD;  Location: [facility];  Service: Orthopedics;  Laterality: Left;  Fasciotomy left ring finger dupuytrens   ELECTROMAGNETIC NAVIGATION BROCHOSCOPY Left 09/19/2019   Procedure: ELECTROMAGNETIC NAVIGATION BRONCHOSCOPY;  Surgeon: Tamea Dedra CROME, MD;  Location: [facility];  Service: Cardiopulmonary;  Laterality: Left;   FASCIECTOMY Right 11/07/2018   Procedure: SEGMENTAL FASCIECTOMY RIGHT RING FINGER;  Surgeon: Curia Arley, MD;  Location:  SURGERY CENTER;  Service: Orthopedics;  Laterality: Right;  AXILLARY BLOCK   FLEXIBLE BRONCHOSCOPY Left 11/26/2019   Procedure: FLEXIBLE BRONCHOSCOPY WITH CRYO THERAPY;  Surgeon: Tamea Dedra CROME, MD;  Location: [facility];  Service: Cardiopulmonary;  Laterality: Left;   FLEXIBLE BRONCHOSCOPY Left 10/14/2021   Procedure: FLEXIBLE BRONCHOSCOPY;  Surgeon: Tamea Dedra CROME, MD;   Location: [facility];  Service: Pulmonary;  Laterality: Left;   FLEXIBLE BRONCHOSCOPY Left 06/11/2024   Procedure: BRONCHOSCOPY, FLEXIBLE;  Surgeon: Tamea Dedra CROME, MD;  Location: [facility];  Service: Cardiopulmonary;  Laterality: Left;  Start 8 AM   IR ANGIOGRAM SELECTIVE EACH ADDITIONAL VESSEL  07/26/2017   IR ANGIOGRAM SELECTIVE EACH ADDITIONAL VESSEL  07/26/2017   IR ANGIOGRAM SELECTIVE EACH ADDITIONAL VESSEL  07/26/2017   IR ANGIOGRAM SELECTIVE EACH ADDITIONAL VESSEL  07/26/2017   IR ANGIOGRAM SELECTIVE EACH ADDITIONAL VESSEL  07/26/2017   IR ANGIOGRAM SELECTIVE EACH ADDITIONAL VESSEL  08/11/2017   IR ANGIOGRAM SELECTIVE EACH ADDITIONAL VESSEL  04/28/2018   IR ANGIOGRAM SELECTIVE EACH ADDITIONAL VESSEL  12/06/2022   IR ANGIOGRAM SELECTIVE EACH ADDITIONAL VESSEL  12/06/2022   IR ANGIOGRAM SELECTIVE EACH ADDITIONAL VESSEL  12/06/2022   IR ANGIOGRAM SELECTIVE EACH ADDITIONAL VESSEL  12/06/2022   IR ANGIOGRAM SELECTIVE EACH ADDITIONAL VESSEL  12/30/2022   IR ANGIOGRAM VISCERAL SELECTIVE  07/26/2017   IR ANGIOGRAM VISCERAL SELECTIVE  08/11/2017   IR ANGIOGRAM VISCERAL SELECTIVE  04/28/2018   IR ANGIOGRAM VISCERAL SELECTIVE  12/06/2022   IR ANGIOGRAM VISCERAL SELECTIVE  12/30/2022   IR EMBO ARTERIAL NOT HEMORR HEMANG INC GUIDE ROADMAPPING  07/26/2017   IR EMBO ARTERIAL NOT HEMORR HEMANG INC GUIDE ROADMAPPING  12/06/2022   IR EMBO TUMOR ORGAN ISCHEMIA INFARCT INC GUIDE ROADMAPPING  08/11/2017   IR EMBO TUMOR ORGAN ISCHEMIA INFARCT INC GUIDE ROADMAPPING  04/28/2018   IR EMBO TUMOR ORGAN ISCHEMIA INFARCT INC GUIDE ROADMAPPING  12/30/2022   IR RADIOLOGIST EVAL & MGMT  07/07/2017   IR RADIOLOGIST EVAL & MGMT  09/07/2017   IR RADIOLOGIST EVAL & MGMT  12/21/2017   IR RADIOLOGIST EVAL & MGMT  03/22/2018   IR RADIOLOGIST EVAL & MGMT  06/08/2018   IR RADIOLOGIST EVAL & MGMT  11/01/2018   IR RADIOLOGIST EVAL & MGMT  03/21/2019   IR RADIOLOGIST EVAL & MGMT  11/08/2019   IR RADIOLOGIST EVAL & MGMT  07/01/2020   IR RADIOLOGIST  EVAL & MGMT  12/18/2020   IR RADIOLOGIST EVAL & MGMT  06/18/2021   IR RADIOLOGIST EVAL & MGMT  12/14/2021   IR RADIOLOGIST EVAL & MGMT  12/31/2021   IR US  GUIDE VASC ACCESS RIGHT  07/26/2017   IR US  GUIDE VASC ACCESS RIGHT  08/11/2017   IR US  GUIDE VASC ACCESS RIGHT  04/28/2018   IR US  GUIDE VASC ACCESS RIGHT  12/06/2022   IR US  GUIDE VASC ACCESS RIGHT  12/30/2022   LUNG LOBECTOMY  06/10/13   upper left lung   MULTIPLE TOOTH EXTRACTIONS     REMOVAL RETAINED LENS Right 11/17/2015  Procedure: REMOVAL RETAINED LENS FROAGMENTS RIGHT EYE;  Surgeon: Dene Etienne, MD;  Location: [facility];  Service: Ophthalmology;  Laterality: Right;   TONSILLECTOMY     UPPER GASTROINTESTINAL ENDOSCOPY  11-03-15   Dr Dessa   VASECTOMY     VIDEO ASSISTED THORACOSCOPY (VATS)/WEDGE RESECTION Left 06/04/2013   Procedure: VIDEO ASSISTED THORACOSCOPY (VATS)/WEDGE RESECTION;  Surgeon: Dallas KATHEE Jude, MD;  Location: [facility] OR;  Service: Thoracic;  Laterality: Left;   VIDEO BRONCHOSCOPY N/A 06/04/2013   Procedure: VIDEO BRONCHOSCOPY;  Surgeon: Dallas KATHEE Jude, MD;  Location: [facility] OR;  Service: Thoracic;  Laterality: N/A;   VIDEO BRONCHOSCOPY Left 08/12/2021   Procedure: VIDEO BRONCHOSCOPY WITHOUT FLUORO;  Surgeon: Tamea Dedra CROME, MD;  Location: [facility];  Service: Cardiopulmonary;  Laterality: Left;   VIDEO BRONCHOSCOPY Left 09/09/2021   Procedure: VIDEO BRONCHOSCOPY WITHOUT FLUORO;  Surgeon: Tamea Dedra CROME, MD;  Location: [facility];  Service: Cardiopulmonary;  Laterality: Left;   Social History   Tobacco Use   Smoking status: Former    Current packs/day: 0.00    Average packs/day: 1 pack/day for 40.0 years (40.0 ttl pk-yrs)    Types: Cigarettes    Start date: 12/09/1958    Quit date: 12/09/1998    Years since quitting: 25.5    Passive exposure: Never   Smokeless tobacco: Never  Vaping Use   Vaping status: Never Used  Substance Use Topics   Alcohol use: No   Drug use: No   Social History    Socioeconomic History   Marital status: Widowed    Spouse name: Orlean   Number of children: 3   Years of education: Not on file   Highest education level: 12th grade  Occupational History   Not on file  Tobacco Use   Smoking status: Former    Current packs/day: 0.00    Average packs/day: 1 pack/day for 40.0 years (40.0 ttl pk-yrs)    Types: Cigarettes    Start date: 12/09/1958    Quit date: 12/09/1998    Years since quitting: 25.5    Passive exposure: Never   Smokeless tobacco: Never  Vaping Use   Vaping status: Never Used  Substance and Sexual Activity   Alcohol use: No   Drug use: No   Sexual activity: Yes    Partners: Female  Other Topics Concern   Not on file  Social History Narrative   Not on file   Social Drivers of Health   Tobacco Use: Medium Risk (06/13/2024)   Patient History    Smoking Tobacco Use: Former    Smokeless Tobacco Use: Never    Passive Exposure: Never  Physicist, Medical Strain: Low Risk (06/08/2023)   Overall Financial Resource Strain (CARDIA)    Difficulty of Paying Living Expenses: Not hard at all  Food Insecurity: No Food Insecurity (06/08/2023)   Hunger Vital Sign    Worried About Running Out of Food in the Last Year: Never true    Ran Out of Food in the Last Year: Never true  Transportation Needs: No Transportation Needs (06/08/2023)   PRAPARE - Administrator, Civil Service (Medical): No    Lack of Transportation (Non-Medical): No  Physical Activity: Insufficiently Active (06/08/2023)   Exercise Vital Sign    Days of Exercise per Week: 6 days    Minutes of Exercise per Session: 20 min  Stress: No Stress Concern Present (06/08/2023)   [employer] of Occupational Health - Occupational Stress Questionnaire    Feeling of  Stress : Not at all  Social Connections: Moderately Isolated (06/08/2023)   Social Connection and Isolation Panel    Frequency of Communication with Friends and Family: More than three times a week     Frequency of Social Gatherings with Friends and Family: More than three times a week    Attends Religious Services: More than 4 times per year    Active Member of [employer] or Organizations: No    Attends Banker Meetings: Not on file    Marital Status: Widowed  Intimate Partner Violence: Not on file  Depression (PHQ2-9): Low Risk (06/13/2024)   Depression (PHQ2-9)    PHQ-2 Score: 0  Alcohol Screen: Low Risk (06/13/2024)   Alcohol Screen    Last Alcohol Screening Score (AUDIT): 0  Housing: Low Risk (06/08/2023)   Housing Stability Vital Sign    Unable to Pay for Housing in the Last Year: No    Number of Times Moved in the Last Year: 0    Homeless in the Last Year: No  Utilities: Not on file  Health Literacy: Not on file   Family Status  Relation Name Status   Mother  Deceased   Sister  Alive   Brother half Alive       diabetes   Father  Deceased       killed in WWII   Brother half Alive   Sister  Deceased       cancer   Son  Alive   Son  Alive   Son  Deceased  No partnership data on file   Family History  Problem Relation Age of Onset   Stroke Mother    Alzheimer's disease Mother    Hypertension Sister    Hyperlipidemia Sister    Hypertension Brother    Hyperlipidemia Brother    Diabetes Brother    Allergies  Allergen Reactions   Tape Itching    Surgical tapes       Review of Systems  All other systems reviewed and are negative.     Objective:     BP 128/76 (Cuff Size: Large)   Pulse 60   Temp 97.9 F (36.6 C) (Oral)   Resp 16   Ht 5' 4 (1.626 m)   Wt 155 lb 12.8 oz (70.7 kg)   SpO2 97%   BMI 26.74 kg/m  BP Readings from Last 3 Encounters:  06/13/24 128/76  06/11/24 (!) 157/73  06/01/24 126/70   Wt Readings from Last 3 Encounters:  06/13/24 155 lb 12.8 oz (70.7 kg)  06/11/24 147 lb (66.7 kg)  06/01/24 155 lb 3.2 oz (70.4 kg)      Physical Exam Constitutional:      Appearance: Normal appearance.  HENT:     Head: Normocephalic  and atraumatic.  Eyes:     Conjunctiva/sclera: Conjunctivae normal.  Cardiovascular:     Rate and Rhythm: Normal rate and regular rhythm.     Pulses:          Dorsalis pedis pulses are 2+ on the right side and 2+ on the left side.  Pulmonary:     Effort: Pulmonary effort is normal.     Breath sounds: Normal breath sounds.  Musculoskeletal:     Right foot: Normal range of motion. No deformity, bunion, Charcot foot, foot drop or prominent metatarsal heads.     Left foot: Normal range of motion. No deformity, bunion, Charcot foot, foot drop or prominent metatarsal heads.  Feet:  Right foot:     Protective Sensation: 6 sites tested.  6 sites sensed.     Skin integrity: Skin integrity normal.     Toenail Condition: Right toenails are normal.     Left foot:     Protective Sensation: 6 sites tested.  6 sites sensed.     Skin integrity: Skin integrity normal.     Toenail Condition: Left toenails are normal.  Skin:    General: Skin is warm and dry.  Neurological:     General: No focal deficit present.     Mental Status: He is alert. Mental status is at baseline.  Psychiatric:        Mood and Affect: Mood normal.        Behavior: Behavior normal.      No results found for any visits on 06/13/24.  Last CBC Lab Results  Component Value Date   WBC 8.3 05/31/2024   HGB 16.2 05/31/2024   HCT 49.1 05/31/2024   MCV 92.5 05/31/2024   MCH 30.5 05/31/2024   RDW 14.3 05/31/2024   PLT 163 05/31/2024   Last metabolic panel Lab Results  Component Value Date   GLUCOSE 98 05/31/2024   NA 141 05/31/2024   K 4.5 05/31/2024   CL 105 05/31/2024   CO2 26 05/31/2024   BUN 23 05/31/2024   CREATININE 1.04 05/31/2024   GFRNONAA >60 05/31/2024   CALCIUM  10.0 05/31/2024   PHOS 3.2 06/17/2019   PROT 7.0 05/31/2024   ALBUMIN  4.4 05/31/2024   LABGLOB 2.2 12/17/2016   AGRATIO 2.0 12/17/2016   BILITOT 0.7 05/31/2024   ALKPHOS 132 (H) 05/31/2024   AST 26 05/31/2024   ALT 23 05/31/2024    ANIONGAP 9 05/31/2024   Last lipids Lab Results  Component Value Date   CHOL 110 06/20/2023   HDL 40 06/20/2023   LDLCALC 48 06/20/2023   TRIG 136 06/20/2023   CHOLHDL 2.8 06/20/2023   Last hemoglobin A1c Lab Results  Component Value Date   HGBA1C 7.4 (A) 12/12/2023   Last thyroid functions Lab Results  Component Value Date   TSH 4.221 06/08/2013   Last vitamin D  No results found for: 25OHVITD2, 25OHVITD3, VD25OH Last vitamin B12 and Folate No results found for: VITAMINB12, FOLATE    The ASCVD Risk score (Arnett DK, et al., 2019) failed to calculate for the following reasons:   The 2019 ASCVD risk score is only valid for ages 40 to 29   * - Cholesterol units were assumed    Assessment & Plan:   Assessment & Plan  Type 2 diabetes mellitus with hyperglycemia A1c previously increased from 6.7 to 7.4. Current A1c pending. No neuropathy symptoms reported. - Performed point of care A1c test which was controlled at 6.7%. - Conducted urine microalbumin test. - Performed foot exam.  Essential hypertension Blood pressure well-controlled at 128/76 mmHg. Amlodipine  used as needed, primarily post-cancer treatment. - Refilled amlodipine  prescription. - Refilled losartan  prescription.  Mixed hyperlipidemia Cholesterol levels previously within normal range. Crestor  due for refill. - Refilled Crestor  prescription.  General health maintenance Pneumonia and flu vaccines up to date. RSV vaccine discussed but declined. - Discussed RSV vaccine availability at pharmacy.  - amLODipine  (NORVASC ) 5 MG tablet; Take 5 mg in the morning and take another 5 mg in the afternoon as needed if BP >150/100  Dispense: 90 tablet; Refill: 1 - losartan  (COZAAR ) 100 MG tablet; Take 1 tablet (100 mg total) by mouth every morning.  Dispense: 90 tablet; Refill:  1 - rosuvastatin  (CRESTOR ) 20 MG tablet; Take 1 tablet (20 mg total) by mouth daily.  Dispense: 90 tablet; Refill: 1 - POCT HgB  A1C - Urine Microalbumin w/creat. ratio - HM Diabetes Foot Exam   Return in about 6 months (around 12/11/2024).    Sharyle Fischer, DO "

## 2024-06-14 ENCOUNTER — Ambulatory Visit: Payer: Self-pay | Admitting: Internal Medicine

## 2024-06-22 ENCOUNTER — Ambulatory Visit: Admitting: Pulmonary Disease

## 2024-07-03 ENCOUNTER — Inpatient Hospital Stay

## 2024-07-04 ENCOUNTER — Inpatient Hospital Stay

## 2024-08-02 ENCOUNTER — Inpatient Hospital Stay

## 2024-09-05 ENCOUNTER — Inpatient Hospital Stay

## 2024-09-05 ENCOUNTER — Inpatient Hospital Stay: Admitting: Oncology

## 2024-12-11 ENCOUNTER — Ambulatory Visit: Admitting: Internal Medicine
